# Patient Record
Sex: Female | Born: 1945
Health system: Southern US, Community
[De-identification: ages and names within clinical notes are randomized; demographics above are authoritative.]

## PROBLEM LIST (undated history)

## (undated) DIAGNOSIS — Z87442 Personal history of urinary calculi: Secondary | ICD-10-CM

## (undated) DIAGNOSIS — M549 Dorsalgia, unspecified: Secondary | ICD-10-CM

## (undated) DIAGNOSIS — F323 Major depressive disorder, single episode, severe with psychotic features: Secondary | ICD-10-CM

## (undated) DIAGNOSIS — F0392 Unspecified dementia, unspecified severity, with psychotic disturbance: Secondary | ICD-10-CM

## (undated) DIAGNOSIS — K219 Gastro-esophageal reflux disease without esophagitis: Secondary | ICD-10-CM

## (undated) DIAGNOSIS — J9811 Atelectasis: Secondary | ICD-10-CM

## (undated) DIAGNOSIS — G709 Myoneural disorder, unspecified: Secondary | ICD-10-CM

## (undated) DIAGNOSIS — R51 Headache: Secondary | ICD-10-CM

## (undated) DIAGNOSIS — F32A Depression, unspecified: Secondary | ICD-10-CM

## (undated) DIAGNOSIS — F0391 Unspecified dementia with behavioral disturbance: Secondary | ICD-10-CM

## (undated) DIAGNOSIS — R519 Headache, unspecified: Secondary | ICD-10-CM

## (undated) DIAGNOSIS — G8929 Other chronic pain: Secondary | ICD-10-CM

## (undated) DIAGNOSIS — E039 Hypothyroidism, unspecified: Secondary | ICD-10-CM

## (undated) DIAGNOSIS — I499 Cardiac arrhythmia, unspecified: Secondary | ICD-10-CM

## (undated) DIAGNOSIS — E785 Hyperlipidemia, unspecified: Secondary | ICD-10-CM

## (undated) DIAGNOSIS — N200 Calculus of kidney: Secondary | ICD-10-CM

## (undated) DIAGNOSIS — F329 Major depressive disorder, single episode, unspecified: Secondary | ICD-10-CM

## (undated) DIAGNOSIS — R292 Abnormal reflex: Secondary | ICD-10-CM

## (undated) DIAGNOSIS — R4189 Other symptoms and signs involving cognitive functions and awareness: Secondary | ICD-10-CM

## (undated) DIAGNOSIS — R0600 Dyspnea, unspecified: Secondary | ICD-10-CM

## (undated) DIAGNOSIS — M1990 Unspecified osteoarthritis, unspecified site: Secondary | ICD-10-CM

## (undated) DIAGNOSIS — J449 Chronic obstructive pulmonary disease, unspecified: Secondary | ICD-10-CM

## (undated) DIAGNOSIS — I10 Essential (primary) hypertension: Secondary | ICD-10-CM

## (undated) HISTORY — PX: SPINAL CORD STIMULATOR IMPLANT: SHX2422

## (undated) HISTORY — PX: OTHER SURGICAL HISTORY: SHX169

## (undated) HISTORY — PX: TUBAL LIGATION: SHX77

## (undated) HISTORY — DX: Hypothyroidism, unspecified: E03.9

## (undated) HISTORY — DX: Cardiac arrhythmia, unspecified: I49.9

## (undated) HISTORY — DX: Dyspnea, unspecified: R06.00

## (undated) HISTORY — DX: Calculus of kidney: N20.0

## (undated) HISTORY — DX: Gastro-esophageal reflux disease without esophagitis: K21.9

## (undated) HISTORY — DX: Dorsalgia, unspecified: M54.9

## (undated) HISTORY — DX: Chronic obstructive pulmonary disease, unspecified: J44.9

## (undated) HISTORY — DX: Atelectasis: J98.11

## (undated) HISTORY — DX: Unspecified osteoarthritis, unspecified site: M19.90

## (undated) HISTORY — DX: Other chronic pain: G89.29

## (undated) HISTORY — DX: Depression, unspecified: F32.A

## (undated) HISTORY — DX: Hyperlipidemia, unspecified: E78.5

## (undated) HISTORY — DX: Major depressive disorder, single episode, unspecified: F32.9

## (undated) HISTORY — DX: Myoneural disorder, unspecified: G70.9

## (undated) HISTORY — PX: ANTERIOR CERVICAL DECOMP/DISCECTOMY FUSION: SHX1161

---

## 1999-08-30 ENCOUNTER — Encounter: Payer: Self-pay | Admitting: Internal Medicine

## 1999-08-30 ENCOUNTER — Encounter: Admission: RE | Admit: 1999-08-30 | Discharge: 1999-08-30 | Payer: Self-pay | Admitting: Internal Medicine

## 1999-11-19 ENCOUNTER — Other Ambulatory Visit: Admission: RE | Admit: 1999-11-19 | Discharge: 1999-11-19 | Payer: Self-pay | Admitting: Internal Medicine

## 1999-12-23 ENCOUNTER — Ambulatory Visit (HOSPITAL_COMMUNITY): Admission: RE | Admit: 1999-12-23 | Discharge: 1999-12-23 | Payer: Self-pay

## 2000-01-03 ENCOUNTER — Ambulatory Visit (HOSPITAL_COMMUNITY): Admission: RE | Admit: 2000-01-03 | Discharge: 2000-01-03 | Payer: Self-pay | Admitting: Gastroenterology

## 2000-09-25 ENCOUNTER — Encounter: Payer: Self-pay | Admitting: Internal Medicine

## 2000-09-25 ENCOUNTER — Encounter: Admission: RE | Admit: 2000-09-25 | Discharge: 2000-09-25 | Payer: Self-pay | Admitting: Internal Medicine

## 2000-10-10 ENCOUNTER — Encounter: Payer: Self-pay | Admitting: Internal Medicine

## 2000-10-10 ENCOUNTER — Encounter: Admission: RE | Admit: 2000-10-10 | Discharge: 2000-10-10 | Payer: Self-pay | Admitting: Internal Medicine

## 2000-11-15 ENCOUNTER — Encounter: Payer: Self-pay | Admitting: Internal Medicine

## 2000-11-15 ENCOUNTER — Encounter: Admission: RE | Admit: 2000-11-15 | Discharge: 2000-11-15 | Payer: Self-pay | Admitting: Internal Medicine

## 2001-01-28 ENCOUNTER — Encounter: Admission: RE | Admit: 2001-01-28 | Discharge: 2001-01-28 | Payer: Self-pay | Admitting: Internal Medicine

## 2001-01-28 ENCOUNTER — Encounter: Payer: Self-pay | Admitting: Internal Medicine

## 2001-03-20 ENCOUNTER — Encounter: Payer: Self-pay | Admitting: Neurosurgery

## 2001-03-20 ENCOUNTER — Ambulatory Visit (HOSPITAL_COMMUNITY): Admission: RE | Admit: 2001-03-20 | Discharge: 2001-03-21 | Payer: Self-pay | Admitting: Neurosurgery

## 2001-05-18 ENCOUNTER — Ambulatory Visit (HOSPITAL_COMMUNITY): Admission: RE | Admit: 2001-05-18 | Discharge: 2001-05-18 | Payer: Self-pay | Admitting: Neurosurgery

## 2001-05-18 ENCOUNTER — Encounter: Payer: Self-pay | Admitting: Neurosurgery

## 2001-07-07 ENCOUNTER — Ambulatory Visit (HOSPITAL_COMMUNITY): Admission: RE | Admit: 2001-07-07 | Discharge: 2001-07-07 | Payer: Self-pay | Admitting: Neurosurgery

## 2001-07-07 ENCOUNTER — Encounter: Payer: Self-pay | Admitting: Neurosurgery

## 2001-08-23 ENCOUNTER — Encounter: Payer: Self-pay | Admitting: Family Medicine

## 2001-08-23 ENCOUNTER — Ambulatory Visit (HOSPITAL_COMMUNITY): Admission: RE | Admit: 2001-08-23 | Discharge: 2001-08-23 | Payer: Self-pay | Admitting: Family Medicine

## 2001-10-08 ENCOUNTER — Encounter: Payer: Self-pay | Admitting: Neurosurgery

## 2001-10-12 ENCOUNTER — Inpatient Hospital Stay (HOSPITAL_COMMUNITY): Admission: RE | Admit: 2001-10-12 | Discharge: 2001-10-16 | Payer: Self-pay | Admitting: Neurosurgery

## 2001-10-12 ENCOUNTER — Encounter: Payer: Self-pay | Admitting: Neurosurgery

## 2001-10-13 ENCOUNTER — Encounter: Payer: Self-pay | Admitting: Neurosurgery

## 2001-10-14 ENCOUNTER — Encounter: Payer: Self-pay | Admitting: Neurosurgery

## 2001-10-15 ENCOUNTER — Encounter: Payer: Self-pay | Admitting: Thoracic Surgery

## 2001-10-15 ENCOUNTER — Encounter: Payer: Self-pay | Admitting: Neurosurgery

## 2001-10-16 ENCOUNTER — Encounter: Payer: Self-pay | Admitting: Thoracic Surgery

## 2001-11-15 ENCOUNTER — Encounter: Admission: RE | Admit: 2001-11-15 | Discharge: 2001-11-15 | Payer: Self-pay | Admitting: Thoracic Surgery

## 2001-11-15 ENCOUNTER — Encounter: Payer: Self-pay | Admitting: Thoracic Surgery

## 2002-04-04 ENCOUNTER — Encounter: Admission: RE | Admit: 2002-04-04 | Discharge: 2002-04-04 | Payer: Self-pay | Admitting: Thoracic Surgery

## 2002-04-04 ENCOUNTER — Encounter: Payer: Self-pay | Admitting: Thoracic Surgery

## 2002-04-18 ENCOUNTER — Other Ambulatory Visit: Admission: RE | Admit: 2002-04-18 | Discharge: 2002-04-18 | Payer: Self-pay | Admitting: Internal Medicine

## 2002-05-02 ENCOUNTER — Encounter: Payer: Self-pay | Admitting: Neurosurgery

## 2002-05-02 ENCOUNTER — Ambulatory Visit (HOSPITAL_COMMUNITY): Admission: RE | Admit: 2002-05-02 | Discharge: 2002-05-02 | Payer: Self-pay | Admitting: Neurosurgery

## 2003-01-23 ENCOUNTER — Ambulatory Visit (HOSPITAL_COMMUNITY): Admission: RE | Admit: 2003-01-23 | Discharge: 2003-01-23 | Payer: Self-pay | Admitting: Gastroenterology

## 2003-02-04 ENCOUNTER — Encounter: Admission: RE | Admit: 2003-02-04 | Discharge: 2003-02-04 | Payer: Self-pay | Admitting: *Deleted

## 2003-02-10 ENCOUNTER — Encounter: Admission: RE | Admit: 2003-02-10 | Discharge: 2003-02-10 | Payer: Self-pay | Admitting: Orthopaedic Surgery

## 2003-03-17 ENCOUNTER — Inpatient Hospital Stay (HOSPITAL_COMMUNITY): Admission: RE | Admit: 2003-03-17 | Discharge: 2003-03-21 | Payer: Self-pay | Admitting: Orthopaedic Surgery

## 2003-07-21 ENCOUNTER — Inpatient Hospital Stay (HOSPITAL_COMMUNITY): Admission: RE | Admit: 2003-07-21 | Discharge: 2003-07-22 | Payer: Self-pay | Admitting: Orthopaedic Surgery

## 2004-06-25 ENCOUNTER — Encounter: Admission: RE | Admit: 2004-06-25 | Discharge: 2004-06-25 | Payer: Self-pay | Admitting: Internal Medicine

## 2004-08-11 ENCOUNTER — Ambulatory Visit (HOSPITAL_COMMUNITY): Admission: RE | Admit: 2004-08-11 | Discharge: 2004-08-11 | Payer: Self-pay | Admitting: Pulmonary Disease

## 2005-02-03 ENCOUNTER — Other Ambulatory Visit: Admission: RE | Admit: 2005-02-03 | Discharge: 2005-02-03 | Payer: Self-pay | Admitting: Internal Medicine

## 2005-02-08 ENCOUNTER — Encounter: Admission: RE | Admit: 2005-02-08 | Discharge: 2005-02-08 | Payer: Self-pay | Admitting: Internal Medicine

## 2005-02-16 ENCOUNTER — Encounter: Admission: RE | Admit: 2005-02-16 | Discharge: 2005-02-16 | Payer: Self-pay | Admitting: Internal Medicine

## 2005-03-09 ENCOUNTER — Encounter: Admission: RE | Admit: 2005-03-09 | Discharge: 2005-03-09 | Payer: Self-pay | Admitting: Internal Medicine

## 2005-05-17 ENCOUNTER — Ambulatory Visit (HOSPITAL_COMMUNITY): Admission: RE | Admit: 2005-05-17 | Discharge: 2005-05-17 | Payer: Self-pay | Admitting: Pulmonary Disease

## 2005-07-20 ENCOUNTER — Inpatient Hospital Stay (HOSPITAL_COMMUNITY): Admission: RE | Admit: 2005-07-20 | Discharge: 2005-07-22 | Payer: Self-pay | Admitting: Orthopaedic Surgery

## 2005-08-30 ENCOUNTER — Encounter: Admission: RE | Admit: 2005-08-30 | Discharge: 2005-08-30 | Payer: Self-pay | Admitting: Internal Medicine

## 2005-10-04 ENCOUNTER — Encounter (INDEPENDENT_AMBULATORY_CARE_PROVIDER_SITE_OTHER): Payer: Self-pay | Admitting: *Deleted

## 2005-10-04 ENCOUNTER — Ambulatory Visit (HOSPITAL_COMMUNITY): Admission: RE | Admit: 2005-10-04 | Discharge: 2005-10-04 | Payer: Self-pay | Admitting: *Deleted

## 2006-09-18 ENCOUNTER — Encounter: Admission: RE | Admit: 2006-09-18 | Discharge: 2006-09-18 | Payer: Self-pay | Admitting: *Deleted

## 2006-09-19 ENCOUNTER — Encounter: Admission: RE | Admit: 2006-09-19 | Discharge: 2006-09-19 | Payer: Self-pay | Admitting: Anesthesiology

## 2007-02-21 ENCOUNTER — Ambulatory Visit (HOSPITAL_COMMUNITY): Admission: RE | Admit: 2007-02-21 | Discharge: 2007-02-21 | Payer: Self-pay | Admitting: Pulmonary Disease

## 2007-03-25 ENCOUNTER — Encounter: Admission: RE | Admit: 2007-03-25 | Discharge: 2007-03-25 | Payer: Self-pay | Admitting: Neurosurgery

## 2007-10-18 ENCOUNTER — Encounter: Admission: RE | Admit: 2007-10-18 | Discharge: 2007-10-18 | Payer: Self-pay | Admitting: Orthopedic Surgery

## 2007-12-11 ENCOUNTER — Ambulatory Visit (HOSPITAL_COMMUNITY): Admission: RE | Admit: 2007-12-11 | Discharge: 2007-12-11 | Payer: Self-pay | Admitting: Anesthesiology

## 2008-02-04 ENCOUNTER — Ambulatory Visit (HOSPITAL_COMMUNITY): Admission: RE | Admit: 2008-02-04 | Discharge: 2008-02-04 | Payer: Self-pay | Admitting: Neurological Surgery

## 2008-07-11 ENCOUNTER — Ambulatory Visit (HOSPITAL_COMMUNITY): Admission: RE | Admit: 2008-07-11 | Discharge: 2008-07-11 | Payer: Self-pay | Admitting: Pulmonary Disease

## 2008-08-12 ENCOUNTER — Other Ambulatory Visit: Admission: RE | Admit: 2008-08-12 | Discharge: 2008-08-12 | Payer: Self-pay | Admitting: Family Medicine

## 2008-08-26 ENCOUNTER — Inpatient Hospital Stay (HOSPITAL_COMMUNITY): Admission: RE | Admit: 2008-08-26 | Discharge: 2008-08-28 | Payer: Self-pay | Admitting: Orthopedic Surgery

## 2009-07-02 ENCOUNTER — Encounter: Admission: RE | Admit: 2009-07-02 | Discharge: 2009-07-02 | Payer: Self-pay | Admitting: Anesthesiology

## 2009-12-10 ENCOUNTER — Ambulatory Visit (HOSPITAL_COMMUNITY): Admission: RE | Admit: 2009-12-10 | Discharge: 2009-12-10 | Payer: Self-pay | Admitting: Neurological Surgery

## 2009-12-14 ENCOUNTER — Ambulatory Visit: Payer: Self-pay | Admitting: Cardiology

## 2010-02-22 ENCOUNTER — Encounter: Admission: RE | Admit: 2010-02-22 | Discharge: 2010-02-22 | Payer: Self-pay | Admitting: Otolaryngology

## 2010-04-10 ENCOUNTER — Encounter: Payer: Self-pay | Admitting: Internal Medicine

## 2010-04-11 ENCOUNTER — Encounter: Payer: Self-pay | Admitting: Pulmonary Disease

## 2010-04-11 ENCOUNTER — Encounter: Payer: Self-pay | Admitting: Internal Medicine

## 2010-04-15 ENCOUNTER — Encounter
Admission: RE | Admit: 2010-04-15 | Discharge: 2010-04-15 | Payer: Self-pay | Source: Home / Self Care | Attending: Internal Medicine | Admitting: Internal Medicine

## 2010-06-09 ENCOUNTER — Encounter: Payer: Self-pay | Admitting: Cardiology

## 2010-06-09 DIAGNOSIS — G894 Chronic pain syndrome: Secondary | ICD-10-CM | POA: Insufficient documentation

## 2010-06-09 DIAGNOSIS — H409 Unspecified glaucoma: Secondary | ICD-10-CM | POA: Insufficient documentation

## 2010-06-09 DIAGNOSIS — G8929 Other chronic pain: Secondary | ICD-10-CM | POA: Insufficient documentation

## 2010-06-09 DIAGNOSIS — E785 Hyperlipidemia, unspecified: Secondary | ICD-10-CM | POA: Insufficient documentation

## 2010-06-14 ENCOUNTER — Ambulatory Visit: Payer: Self-pay | Admitting: Cardiology

## 2010-06-23 ENCOUNTER — Ambulatory Visit: Payer: Self-pay | Admitting: Cardiology

## 2010-06-28 LAB — URINALYSIS, ROUTINE W REFLEX MICROSCOPIC
Glucose, UA: NEGATIVE mg/dL
Hgb urine dipstick: NEGATIVE
Specific Gravity, Urine: 1.017 (ref 1.005–1.030)

## 2010-06-28 LAB — URINE MICROSCOPIC-ADD ON

## 2010-06-28 LAB — CBC
HCT: 30.9 % — ABNORMAL LOW (ref 36.0–46.0)
HCT: 42.9 % (ref 36.0–46.0)
Hemoglobin: 10.8 g/dL — ABNORMAL LOW (ref 12.0–15.0)
Hemoglobin: 11.1 g/dL — ABNORMAL LOW (ref 12.0–15.0)
MCHC: 33.7 g/dL (ref 30.0–36.0)
MCHC: 34.7 g/dL (ref 30.0–36.0)
MCHC: 35 g/dL (ref 30.0–36.0)
MCV: 92.8 fL (ref 78.0–100.0)
MCV: 93 fL (ref 78.0–100.0)
Platelets: 249 10*3/uL (ref 150–400)
RBC: 3.46 MIL/uL — ABNORMAL LOW (ref 3.87–5.11)
RDW: 13.9 % (ref 11.5–15.5)
WBC: 7.9 10*3/uL (ref 4.0–10.5)
WBC: 8.6 10*3/uL (ref 4.0–10.5)

## 2010-06-28 LAB — BASIC METABOLIC PANEL
BUN: 16 mg/dL (ref 6–23)
CO2: 28 mEq/L (ref 19–32)
CO2: 28 mEq/L (ref 19–32)
Calcium: 8.1 mg/dL — ABNORMAL LOW (ref 8.4–10.5)
Calcium: 9.8 mg/dL (ref 8.4–10.5)
Chloride: 100 mEq/L (ref 96–112)
Creatinine, Ser: 0.69 mg/dL (ref 0.4–1.2)
Creatinine, Ser: 1.06 mg/dL (ref 0.4–1.2)
GFR calc non Af Amer: 52 mL/min — ABNORMAL LOW (ref >60)
GFR calc non Af Amer: 58 mL/min — ABNORMAL LOW (ref >60)
Glucose, Bld: 133 mg/dL — ABNORMAL HIGH (ref 70–99)
Glucose, Bld: 91 mg/dL (ref 70–99)
Potassium: 3.8 mEq/L (ref 3.5–5.1)
Potassium: 3.9 mEq/L (ref 3.5–5.1)
Sodium: 131 mEq/L — ABNORMAL LOW (ref 135–145)

## 2010-06-28 LAB — DIFFERENTIAL
Basophils Relative: 0 % (ref 0–1)
Eosinophils Absolute: 0.2 10*3/uL (ref 0.0–0.7)
Eosinophils Relative: 3 % (ref 0–5)
Lymphs Abs: 2.4 10*3/uL (ref 0.7–4.0)
Monocytes Relative: 8 % (ref 3–12)

## 2010-08-03 NOTE — H&P (Signed)
NAMEMarland Kitchen  SHONIKA, KOLASINSKI           ACCOUNT NO.:  [id number]   MEDICAL RECORD NO.:  [id number]           PATIENT TYPE:  INP   LOCATION:                               FACILITY:  [facility]   PHYSICIAN:  Madlyn Frankel. Charlann Boxer, M.D.  DATE OF BIRTH:  [birth year]   DATE OF ADMISSION:  08/26/2008  DATE OF DISCHARGE:                              HISTORY & PHYSICAL   PROCEDURE:  Revision right total knee replacement.   CHIEF COMPLAINT:  Right knee pain.   HISTORY OF PRESENT ILLNESS:  A 65 year old female with a history of  right-knee pain with a history of right total knee replacement with  evidence of loosening by bone scan.  Pain is now intractable, refractory  to all conservative treatment.  She is being admitted, has been  presurgically assessed prior to revision right total knee replacement.   PAST MEDICAL HISTORY:  1. Osteoarthritis.  2. COPD.  3. Depression.  4. Hypercholesterolemia.  5. Atrial fibrillation.  6. Irritable bowel syndrome.  7. Recurrent urinary tract infections.  8. Hypothyroidism.  9. Degenerative disk disease.   PAST SURGICAL HISTORY:  1. Includes multiple cervical spine surgeries.  2. Thoracic spine surgery.  3. Left hip surgery.  4. Right total knee replacement as noted in HPI.  5. Cholecystectomy.   FAMILY HISTORY:  Heart disease.   SOCIAL HISTORY:  She is a smoker, smokes half-pack of cigarettes per day  for the past 50 years.  Primary caregiver will be her husband in the  home postoperatively.   DRUG ALLERGIES:  PENICILLIN, ASPIRIN, CODEINE.   CURRENT MEDICATIONS:  1. Inderal 80 mg p.o. b.i.d.  2. Lanoxin 0.25 mg daily.  3. Librax 2.5 mg daily.  4. Synthroid 50 mcg p.o. daily.  5. Theophylline 200 mg two p.o. daily.  6. Zoloft 100 mg p.o. daily.  7. Methadone 5 mg p.o. q. 6.  8. Diazepam 5 mg one p.o. q. 8 p.r.n.  9. Oxycodone 5 mg one p.o. q. 6 p.r.n.   REVIEW OF SYSTEMS:  GENERAL:  She has some weight change and fatigue.  HEENT: She has headaches,  insomnia, ringing in her years, balance  problems and weakness.  RESPIRATORY: She has shortness of breath on  exertion, wheezing.  Last chest x-ray was July 11, 2008.  CARDIOVASCULAR:  She has some swelling issues.  Last EKG was done on  February 04, 2008.  GI: She has nausea, constipation, difficulty  swallowing.  MUSCULOSKELETAL:  She has joint pain, joint swelling, back  pain, and muscular weakness.  Otherwise see HPI.   PHYSICAL EXAMINATION:  VITAL SIGNS:  Pulse 72, respirations 16, blood  pressure 130/70.  GENERAL:  Awake, alert and oriented.  HEENT: Normocephalic.  Neck: Supple.  No carotid bruits.  CHEST/LUNGS:  Clear to auscultation bilaterally.  BREASTS:  Deferred.  Heart: S1, S2 distinct.  ABDOMEN:  Soft, bowel sounds present.  GENITOURINARY:  Deferred.  EXTREMITIES:  Right knee pain.  Painful with range of motion and  weightbearing.  SKIN:  No cellulitis.  NEUROLOGIC:  Intact distal sensibilities.   LABORATORY DATA:  Labs, EKG, chest x-ray all pending presurgical  testing.   IMPRESSION:  Loose right total knee replacement, as evidenced by bone  scan.   PLAN OF ACTION:  Revision right total knee replacement by Dr. Charlann Boxer at  [facility] on August 26, 2008.  No medications were provided at history  and physical.  Due to aspirin allergies, may require Coumadin.  All  questions were encouraged, answered, reviewed.     ______________________________  Yetta Glassman [address], [place]      Madlyn Frankel. Charlann Boxer, M.D.  Electronically Signed    BLM/MEDQ  D:  08/20/2008  T:  08/20/2008  Job:  [id number]   cc:   Dr. Lavone Orn L. Vear Clock, M.D.  Fax: [phone]   Oneal Deputy. Juanetta Gosling, M.D.  Fax: [phone]

## 2010-08-03 NOTE — Op Note (Signed)
NAMEMarland Kitchen  CHARLANN, [name], M.D.  DATE OF BIRTH:  21-Oct-1945   DATE OF PROCEDURE:  02/04/2008  DATE OF DISCHARGE:  02/04/2008                               OPERATIVE REPORT   PREOPERATIVE DIAGNOSIS:  Intractable post-thoracotomy pain.   POSTOPERATIVE DIAGNOSIS:  Intractable post-thoracotomy pain.   PROCEDURE:  Insertion of spinal cord stimulator at T3-T4, [name], [name],  [name], [name], [name], the leads were tightened  down to the unit and the unit was placed into the pouch with the wires  coiled underneath it.  Final radiograph was obtained to make sure that  the lead had not moved at all in the thoracic spine and then the  thoracodorsal fascia was closed with 2-0 Vicryl in the interrupted  fashion and 3-0 Vicryl using subcuticular tissues in the lateral flank  site and the left side was closed with 2-0 Vicryl and 3-0 Vicryl  similarly.  Dermabond was placed on the skin.  Blood loss was nil.      Stefani Dama, M.D.  Electronically Signed     HJE/MEDQ  D:  02/04/2008  T:  02/05/2008  Job:  [id number] 

## 2010-08-03 NOTE — Op Note (Signed)
NAMEMarland Kitchen  Destiny Hughes, Destiny Hughes           ACCOUNT NO.:  [id number]   MEDICAL RECORD NO.:  [id number]          PATIENT TYPE:  INP   LOCATION:  1618                         FACILITY:  [facility]   PHYSICIAN:  Madlyn Frankel. Charlann Boxer, M.D.  DATE OF BIRTH:  1945/04/21   DATE OF PROCEDURE:  08/26/2008  DATE OF DISCHARGE:                               OPERATIVE REPORT   PREOPERATIVE DIAGNOSIS:  Right total knee replacement, with primary  concern of a loose tibial component.   POSTOPERATIVE DIAGNOSES:  Failed right total knee replacement, with a  loose tibial component, and ligament instability.   PROCEDURE:  Revision right total knee replacement; with a primary  revision of the tibial component with a DePuy MBT revision component  size 2.5.  With 10 mm augments medially and laterally size 2, and a 10  mm posterior stabilized insert.   ASSISTANT:  Surgical tech.   ANESTHESIA:  General plus a preoperative regional femoral block.   FINDINGS:  There is no clinical concerns for infection.   DRAINS:  One Hemovac.   COMPLICATIONS:  None.   TOURNIQUET TIME:  49 minutes at 250 mmHg.   The patient was stable to the recovery room.   INDICATIONS:  Destiny Hughes is a 65 year old female with history of  right total knee replacement om 2007.  She never had significant relief  of her pain and symptoms of the knee.  She had been seen and evaluated  in our office, and was worked up in ruling out infection; but then  subsequently ordering a bone scan.  The bone scan had indicated  loosening predominantly of the tibial component.   Based on the persistence of her pain, still with pain in addition to her  aggravation associated and decreased quality of life based on pain, we  discussed surgical intervention.  The risks of knee replacement surgery  revision were discussed in terms of stiffness, DVT, component failure,  need for re-revision.  Consent was obtained for the benefit of pain  relief.   PROCEDURE IN  DETAIL:  The patient was brought to the operative theater.  Once adequate anesthesia, preoperative antibiotics clindamycin  administered, the patient was positioned supine with a right thigh  tourniquet placed.  The right lower extremity was then prescrubbed,  prepped and draped in a sterile fashion.  Time-out was performed,  identifying the planned procedure, the patient and the extremity.   The leg was then exsanguinated, tourniquet elevated to 250 mmHg.  A  midline incision was made, followed by sharp dissection and debridement  of the knee joint.  Examination under anesthesia preoperatively  indicated that she had some hyperextension and had some looseness from  extension into flexion.  The ligaments felt intact, however.  Following  dissection and exposure of the proximal tibia, I was able to subluxate  it anteriorly without any concern about femoral impingement.  Based on  this, I went ahead and made the primary focus the tibial component.  Examination of the femur revealed that it was grossly stable, without  evidence of any migration.  In contrast, a small knock on the proximal  tibia with  a 0.25-inch  osteotome single blower elevated the tibial tray  right off of the cement.  There was complete debonding of the cement  component and boundary.   At this point, with the proximal tibia exposed, I removed all cement  using osteotomes.  Once I had this done, I placed an extramedullary  guide and impacted off approximately 2 cm for a 2-degree block  separation for the MBT revision component.  I checked the cut surface  and made sure that it was perpendicular in both planes; and I felt that  a 2.5 tibial tray fit best on it.   At this point, the proximal tibia was drilled and a keel punch applied.  Trial reduction was now carried out with a retained size 3 femoral  component, a size 2.5 MBT revision tray in place.  When I did this, I  went all the way up to a 20 mm insert before I  felt the knee was stable,  with the knee in neutral extension.   Given this, we chose the final components.  I decided at this point to  go ahead and augment the proximal tibia, to restore the joint line,  using 10 mm augments.  I chose the 2.5 tibial tray, size 2 augments  medially and laterally.  Because of this 10 mg augment and loss of  rotation control, I decided to use a 29-mm cemented sleeve.   Trial components were removed and the proximal tibia re-exposed.  I went  ahead an broached the proximal tibia for the 29-mm sleeve.  Final  components were opened and then cement mixed.  On the back table we  configured the 2.5 MBT tray with the sleeve and the 10-mm augments  medially and laterally.  Once this was done, cement was mixed and  matched 3 bags of cement with 500 mg of vancomycin.  Once the cement was  prepared, the final tibia components were cemented into position.  The  knee was brought out to extension with the  10 mm insert.   Palpably, the medial and lateral collateral ligaments were felt to be  snug in extension.  The medial ligament was felt much more stable with  this insert in place.   Given these findings, once I removed the remaining cement from the knee  and the cement had cured, the final 10-mm insert was chosen.   The knee was re-irrigated with pulse lavage, and the tourniquet was let  down at 49 minutes.  A medium Hemovac drain was placed deep.   At this point, the extensor mechanism was reapproximated with the knee  in flexion, using #1 Vicryl; and the remainder of the wound was closed  with 2-0 Vicryl and staples on the skin.  Skin was cleaned, dried and  dressed sterilely in a sterile bulky Jones dressing.  She was extubated  and brought to the recovery room in stable condition.      Madlyn Frankel Charlann Boxer, M.D.  Electronically Signed     MDO/MEDQ  D:  08/26/2008  T:  08/27/2008  Job:  [id number]

## 2010-08-06 NOTE — Op Note (Signed)
[facility]  Patient:    Destiny Hughes, Destiny Hughes Visit Number: [id number] MRN: [id number]          Service Type: DSU Location: 3000 [id number] Attending Physician:  Donn Pierini Dictated by:   Julio Sicks, M.D. Proc. Date: 03/20/01 Admit Date:  03/20/2001                             Operative Report  PREOPERATIVE DIAGNOSIS:  POSTOPERATIVE DIAGNOSIS:  OPERATION PERFORMED:  SURGEON:  Julio Sicks, M.D.  ASSISTANT:  ANESTHESIA:  General endotracheal.  INDICATIONS FOR PROCEDURE:  Ms. Caillier is a 65 year old female with a history of chronic neck pain and worsening bilateral upper extremity paresthesias and weakness consistent with a cervical myelopathy.  MRI scanning demonstrates severe stenosis at the C4-5, C5-6 and C6-7 levels secondary to spondylitic disease and an associated disk herniation at C4-5.  The patient has been counseled as to her options.  She has decided to proceed with a C4-5, C5-6 and C6-7 cervical diskectomy and fusion with allograft and anterior plating for hopeful improvement of her symptoms.  We discussed the risks and benefits involved in this procedure and the patient wishes to proceed.   DESCRIPTION OF PROCEDURE:  The patient was taken to the operating room and placed on the operating table in supine position.  After an adequate level of anesthesia was achieved, the patient was positioned supine with the neck slightly extended and held in place with halter traction.  The patients anterior cervical region was shaved and prepped sterilely.  A 10 blade was used to make a linear skin incision extending from midline to the medial half of the right-sided sternocleidomastoid muscle.  This was carried down sharply to the platysma.  The platysma was then divided vertically and dissection proceeded along the medial border of the sternocleidomastoid muscle and carotid sheath on the right.  The trachea and esophagus were  mobilized and retracted towards the left.  Prevertebral fascia was stripped off the anterior spinal column.  The longus colli muscles were then elevated bilaterally using electrocautery.  Deep self-retaining retractor was placed.  Intraoperative fluoroscopy was used and the C4-5, C5-6 and C6-7 levels were confirmed.  The disk spaces were then incised at all three levels with a 15 blade in a rectangular fashion.  A wide disk space clean-out was then achieved at all levels using pituitary rongeurs, forward and backward angled Carlens curets, Kerrison rongeurs and a high speed drill.  All elements of the disk were removed down to the posterior annulus.  Microscope was brought into the field and used throughout the remainder of the diskectomy.  Starting first at C4-5, the remaining aspects of annulus and osteophytes were removed down to the level of the posterior longitudinal ligament.  The posterior longitudinal ligament was then elevated and resected in piecemeal fashion using Kerrison rongeurs.  The underlying thecal sac was identified.  A wide central decompression was then performed using Kerrison rongeurs to undercut the bodies of C4 and C5.  A moderate amount of free disk herniation was encountered and completely resected.  Decompression was then proceeded out to the proximal aspect of the C5 foramina bilaterally.  Each C5 nerve root was identified and found to be free of any compression. At this point the level was irrigated and Gelfoam was placed topically for hemostasis.  A 7 mm patellar wedge allograft was then impacted in place and recessed approximately 1 mm  from the anterior cortical surface.  The procedure was then repeated at the C5-6 and C6-7.  Again, wide central decompressions were performed at both levels.  There was no evidence of injury to thecal sac or nerve roots.  There was no evidence of any residual stenosis.  Once again Gelfoam was placed at each level for  hemostasis.  6 mm patellar wedge allografts were then impacted into place at both of these levels and recessed approximately 1 mm from the anterior cortical surface.  Intraoperative fluoroscopy revealed good position of the bone grafts at the proper operative level with normal alignment of the spine.  A 57 mm Atlantis anterior cervical plate was then placed over the C4, C5, C6 and C7 levels.  It was then attached under fluoroscopic guidance by using 14 mm screws x 2 at C4, C5 and C6 and 15 mm screws x 2 at C7.  All eight screws were given a final tightening and found to be solidly within bone. Locking screws were placed at all four levels.  Retraction system was then removed.  Final images of fluoroscopy revealed good position of the bone grafts and hardware at the proper operative level with normal alignment of the spine.  Hemostasis was then ensured with bipolar electrocautery.  The wound was then irrigated one final time and then closed in a routine fashion.  There were no apparent complications.   The patient tolerated the procedure well and she returned to the recovery room postoperatively. Dictated by:   Julio Sicks, M.D. Attending Physician:  Donn Pierini DD:  03/20/01 TD:  03/20/01 Job: [id number]

## 2010-08-06 NOTE — Op Note (Signed)
NAME:  Destiny Hughes, Destiny Hughes                     ACCOUNT NO.:  [id number]   MEDICAL RECORD NO.:  [id number]                   PATIENT TYPE:  INP   LOCATION:  5014                                 FACILITY:  [facility]   PHYSICIAN:  Mark C. Ophelia Charter, M.D.                 DATE OF BIRTH:  07-Mar-1946   DATE OF PROCEDURE:  03/17/2003  DATE OF DISCHARGE:  03/21/2003                                 OPERATIVE REPORT   PREOPERATIVE DIAGNOSIS:  Left hip avascular necrosis.   POSTOPERATIVE DIAGNOSIS:  Left hip avascular necrosis.   PROCEDURE:  Left hip bipolar hemiarthroplasty, Osteonix 26-44 head, C-taper  +10 neck; primary secure fit plus hip stem, #7 size.   SURGEON:  Mark C. Ophelia Charter, M.D.   ASSISTANT:  Genene Churn. Owens, P.A.-C.   ANESTHESIA:  GOT.   ESTIMATED BLOOD LOSS:  200 mL.   COMPLICATIONS:  None.   PROCEDURE:  After the induction of general endotracheal anesthesia and  orotracheal intubation, standard prepping and draping with the patient in  the lateral  position  with an axillary roll. Preoperative Ancef was given  and the usual hip sheets, drapes, Betadine Vidrape and appropriate stockinet  and Coban were all applied.   A posterior  approach was made. The gluteus maximus was split in line with  the fibers. The piriformis was tagged prior to being divided. The nerve was  gently protected. A Charnley retractor was placed, just catching the muscle.  There was no undue tension on the nerve. The hip capsule was teed. The neck  was cut 1 fingerbreadth above the lesser trochanter. The head was removed.   The was severe avascular changes. The acetabulum was preserved.  Sequential  reaming of the femur, broaching was performed and a #7 stem was inserted  with excellent fit. Trials of the acetabulum was performed. A 45 head fit  well and bipolar snap-on was a 26 snap-on with a 44-mm external diameter.  The +10 neck restored neck length. There was good stability and flexion to  90 degrees  internal rotation to 75 to 80 degrees with adduction.   The piriformis was repaired. The loose neck  was also repaired with 0  Tycron, 0 Vicryl in the gluteus maximus fascia, 2-0 Vicryl in the  subcutaneous tissue, the skin with staple closure, postoperative dressing  and a knee immobilizer. Instrument and needle  counts were correct.                                               Mark C. Ophelia Charter, M.D.    MCY/MEDQ  D:  04/12/2003  T:  04/12/2003  Job:  [id number]

## 2010-08-06 NOTE — Discharge Summary (Signed)
NAME:  Destiny Hughes, Destiny Hughes                     ACCOUNT NO.:  [id number]   MEDICAL RECORD NO.:  [id number]                   PATIENT TYPE:  INP   LOCATION:  5014                                 FACILITY:  [facility]   PHYSICIAN:  Mark C. Ophelia Charter, M.D.                 DATE OF BIRTH:  1946/01/07   DATE OF ADMISSION:  03/17/2003  DATE OF DISCHARGE:  03/21/2003                                 DISCHARGE SUMMARY   FINAL DIAGNOSIS:  Left hip avascular necrosis.   PROCEDURE:  Left hip bipolar hemiarthroplasty on March 17, 2003.   ADDITIONAL DIAGNOSES:  1. Aseptic necrosis of femoral head.  2. Paroxysmal atrial tachycardia.  3. Hypertension.   HISTORY OF PRESENT ILLNESS:  This 65 year old female has had progressive hip  pain with plain radiograph changes of AVN and MRI evidence of near whole  head involvement.  She has less severe degenerative changes on the right hip  and no acetabular changes.  The patient's admission medications included  Phenergan, morphine sulfate 15 mg 1 p.o. q.6 h., methadone 10 mg 2 p.o. q.6  h. p.r.n. pain, Valium, Lanoxin, propranolol, MiraLax, Fleet Enema p.r.n.,  vitamin B12, B6 and nitroglycerin.  She has been treated for her chronic  pain elsewhere.   LABORATORY AND ACCESSORY CLINICAL DATA:  The patient had routine  preoperative labs which demonstrated a chest x-ray with changes of COPD, no  active disease.  C spine shows normal soft tissue, multilevel degenerative  changes.  X-rays of her hip showed avascular necrosis.   Hemoglobin was 14.5, normal differential.  Pro time and PTT were normal.  Chemistry panel was normal.  Urinalysis was normal.  The patient was O-  negative.   HOSPITAL COURSE:  The patient was admitted and after informed consent,  underwent left hip bipolar hemiarthroplasty under general anesthesia.  Acetabulum was well-preserved.  Postoperatively, she was continued on her  chronic pain medication.  She received Ancef preoperatively.  Postoperatively, she was seen by PT, OT and pharmacy anti-DVT prophylaxis  program.  She already was back on her normal morphine sulfate IR 15 mg and  her methadone.  Home health OT was arranged and she was discharged on  March 21, 2003, having met all discharge criteria by PT and OT.   CONDITION ON DISCHARGE:  Satisfactory.                                                Mark C. Ophelia Charter, M.D.    MCY/MEDQ  D:  04/12/2003  T:  04/13/2003  Job:  [id number]

## 2010-08-06 NOTE — Op Note (Signed)
NAMEMarland Hughes  ROCIO, ROAM           ACCOUNT NO.:  [id number]   MEDICAL RECORD NO.:  [id number]          PATIENT TYPE:  INP   LOCATION:  5013                         FACILITY:  [facility]   PHYSICIAN:  Mark C. Ophelia Charter, M.D.    DATE OF BIRTH:  05/10/1945   DATE OF PROCEDURE:  07/20/2005  DATE OF DISCHARGE:                                 OPERATIVE REPORT   PREOPERATIVE DIAGNOSIS:  Right knee osteoarthritis.   POSTOPERATIVE DIAGNOSIS:  Right knee osteoarthritis.   PROCEDURE:  Right cemented total knee arthroplasty, computer-assisted.   SURGEON:  Mark C. Ophelia Charter, M.D.   ANESTHESIA:  GOT plus preoperative femoral block and 15 mL of local to skin.   TOURNIQUET TIME:  One hour 17 minutes x350.   ESTIMATED BLOOD LOSS:  Minimal.   DESCRIPTION OF PROCEDURE:  After induction of general anesthesia,  preoperative Ancef prophylaxis and preoperative femoral nerve block, the leg  was prepped with DuraPrep with the usual extremity sheaths, drapes and  lateral post and heel bump had been used.  Total knee draped with sheets and  drapes, sterile skin marker, Betadine and Vi-Drape were applied on the skin.  Esmarch was wrapped around the leg and tourniquet inflated.  A midline  incision was made.  Superficial retinaculum was divided, true retinaculum  was divided and quad tendon was split between the medial one-third and  lateral two-thirds.  The patella was flipped over and was cut from facet-to-  facet, removing 9.5 mm of bone.  Initiation with the computer was performed,  entering the information which showed that there was 1 degree varus and full  extension.  Cut was made on the tibia using the computer, followed by  tensioner with mid-B range in both flexion and extension.  Femur was a 3.5.  Tibia was a 2.5.  A 10-mm spacer gave a difference of a 1-mm cut, medial and  lateral.  Cuts were made and verified.  Box cut was made and at the time of  the box cut, the box cut guide was bumping against the  femoral pin slightly  and it was elected to go ahead and pull the pin, since all measurements had  been made.  Posterior spurs were removed off the femur.  ACL, PCL and  meniscal remnants had all been resected.  A 35-mm patella was selected.  Keels were made in the tibia; 9.5 mm of bone were selected per computer and  there was symmetrical flexion and extension balance.  After pulsatile  irrigation, components were cemented into the tibia, followed by femur  spacer was placed followed by patella.  Cement was hardened at 15 minutes  and all excessive cement had been removed.  There were clinical findings  were excellent stability with the 10-mm spacer.  Tourniquet was deflated;  hemostasis was obtained.  Tourniquet time was 1 hour  and 17 minutes.  Hemostasis was obtained prior to closure.  Deep retinaculum  was closed with #1 Tycron, 2-0 Vicryl in the subcutaneous tissue, skin  staple closure, Marcaine infiltration, Xeroform, 4 x 4's, ABD, Webril, Ace  wrap and knee immobilizer.  Instrument count and  needle count were correct.      Mark C. Ophelia Charter, M.D.  Electronically Signed     MCY/MEDQ  D:  07/20/2005  T:  07/21/2005  Job:  [id number]

## 2010-08-06 NOTE — Discharge Summary (Signed)
NAMEMarland Kitchen  Destiny Hughes, Destiny Hughes           ACCOUNT NO.:  [id number]   MEDICAL RECORD NO.:  [id number]          PATIENT TYPE:  INP   LOCATION:  1618                         FACILITY:  [facility]   PHYSICIAN:  Madlyn Frankel. Charlann Boxer, M.D.  DATE OF BIRTH:  Jul 19, 1945   DATE OF ADMISSION:  08/26/2008  DATE OF DISCHARGE:  08/28/2008                               DISCHARGE SUMMARY   ADMISSION DIAGNOSES:  1. Osteoarthritis.  2. Chronic obstructive pulmonary disease.  3. Depression.  4. Hypercholesterolemia.  5. Atrial fibrillation.  6. Irritable bowel syndrome.  7. Recurrent urinary tract infection.  8. Hypothyroidism.  9. Degenerative disk disease.   DISCHARGE DIAGNOSIS:  1. Osteoarthritis.  2. Chronic obstructive pulmonary disease.  3. Depression.  4. Hypercholesterolemia.  5. Atrial fibrillation.  6. Irritable bowel syndrome.  7. Recurrent urinary tract infections.  8. Hypothyroidism.  9. Degenerative disk disease.   HISTORY OF PRESENT ILLNESS:  This 65 year old female with a history of  right knee pain and a history right total knee replacement with evidence  of loosening by bone scan.   CONSULTATION:  None.   PROCEDURE:  A revision of right total knee replacement by surgeon Dr.  Durene Romans.   LABORATORY DATA:  CBC final reading showed her white blood cell count of  7.9, hemoglobin 11.1, hematocrit 32.1, platelets 182.  White cell  differential normal.  Coagulation normal.  Metabolic panel:  Sodium 134,  potassium 3.9, BUN 4, creatinine 0.69, glucose 119.  Calcium was 8.1.   HOSPITAL COURSE:  The patient underwent a revision of her right total  knee replacement and tolerated the procedure well. She was admitted to  the orthopedic floor.  Her stay was unremarkable.  She remained  hemodynamically and orthopedically stable.  By day two she was doing  great afebrile, and the dressing was changed.  No drainage from the  wound.  Home health care was selected.  She had met all criteria for  discharge home with home health care PT.   DISCHARGE DISPOSITION:  Discharged home with home health care PT in a  stable and improved condition.   DISCHARGE DIET:  Heart-healthy diet.   DISCHARGE INSTRUCTIONS:  Keep the dressing dry.   DISCHARGE PHYSICAL THERAPY:  Weightbearing as tolerated with use of a  rolling walker.  Range of motion goal is 0 to 125 in six weeks.   DISCHARGE MEDICATIONS:  1. Oxycodone 5 mg, one to three tablets p.o. q.4-6h. p.r.n. pain.  2. Robaxin 5 mg, one p.o. q.6h. p.r.n. muscle spasm and pain.  3. Aspirin 325 mg p.o. b.i.d. x6 weeks.  4. Colace 100 mg p.o. b.i.d.  5. MiraLax 17 grams p.o. daily.  6. Propranolol 80 mg, one p.o. q.a.m. and one p.o. q.p.m.  7. Methadone 5 mg, one p.o. q.6h. p.r.n. pain.  8. Promethazine 25 mg, one p.o. q.12h. p.r.n.  9. Librax 2.5/5 mg p.o. q.a.m.  10.Lanoxin 0.25 mg p.o. q.a.m.  11.Diazepam 5 mg, one p.o. q.8h. p.r.n.  12.Sertraline 100 mg, one p.o. q.a.m.  13.Synthroid 75 mcg at noon daily.  14.Lorazepam q.h.s. p.r.n.  15.Ipratropium bromide 0.06% nasal spray, one spray in  each nostril      q.a.m. and two sprays in each nostril q.p.m.  16.Biotin 500 mcg p.o. q.a.m.  17.Phenolphthalein 200 mg, one p.o. q.a.m. and one p.o. q.p.m.  18.Sennosides 25 mg, two in the p.m.  19.Equate sleep aide p.r.n.  20.To complete Cipro 500 mg x5 days, one p.o. b.i.d. which was started      on 08/25/2008.  Complete on 08/30/2008.   FOLLOWUP:  Follow up with Dr. Charlann Boxer at phone number [phone] in two weeks  for a wound check.     ______________________________  Yetta Glassman. [address], [place]      Madlyn Frankel. Charlann Boxer, M.D.  Electronically Signed    BLM/MEDQ  D:  09/05/2008  T:  09/05/2008  Job:  [id number]   cc:   Loraine Leriche L. Vear Clock, M.D.  Fax: [phone]   Oneal Deputy. Juanetta Gosling, M.D.  Fax: [phone]   Christiana Fuchs, M.D.

## 2010-08-06 NOTE — Op Note (Signed)
. [facility]  Patient:    Destiny Hughes, MOAT Visit Number: [id number] MRN: [id number]          Service Type: SUR Location: [id number] Attending Physician:  Donn Pierini Dictated by:   D. Karle Plumber, M.D. Proc. Date: 10/12/01 Admit Date:  10/12/2001   CC:         Dr. Edwyna Shell (2 copies)  Julio Sicks, M.D.   Operative Report  PREOPERATIVE DIAGNOSIS:  T7-8 ______.  POSTOPERATIVE DIAGNOSIS:  T7-8 ______.  OPERATION PERFORMED:  Right thoracotomy for exposure for microdiskectomy.  SURGEON:  D. Karle Plumber, M.D.  FIRST ASSISTANT:  Gina H. Collins, P.A.-C.  ANESTHESIA:  General.  DESCRIPTION OF PROCEDURE:  After adequate general anesthesia, the patient was turned to the right lateral thoracotomy position.  He was prepped and draped in the usual sterile manner.  Posterolateral thoracotomy was made and was carried down with electrocautery through the subcutaneous tissue and fascia. The latissimus was divided with electrocautery, and the serratus was reflected anteriorly.  The sixth intercostal space was entered where a portion of the seventh rib was taken subperiosteally at the angle.  Two TPAs were placed at the right angle; then Dr. Jordan Likes and I exposed the rib head of the seventh space and the disk space by doing a pleural flap from medially to laterally.  After this had been done, Dr. Jordan Likes performed a microdiskectomy, and we returned to close.  Two stab wounds were made below the original incisions, and two chest tubes were inserted and tied in place with 0 silk.  The chest was closed with three precostals; then a Marcaine block was done in the usual fashion, and then the On-Q catheters were inserted through putting in the sheaths and then putting the catheters underneath the precostals medially and laterally and sutured them in place with 3-0 silks.  The sheaths were removed, and they were peel-away sheaths.  The serratus was  closed with interrupted 3-0 Vicryl, and the platysmus was closed with running 0 Vicryl, subcutaneous tissue with 3-0 Vicryl, and subcuticular with 3-0 Vicryl, and Dermabond for the skin.  The patient returned to the recovery room in stable condition. Dictated by:   D. Karle Plumber, M.D. Attending Physician:  Donn Pierini DD:  10/12/01 TD:  10/15/01 Job: [id number]

## 2010-08-06 NOTE — Op Note (Signed)
   NAME:  DAMONICA, CHOPRA                     ACCOUNT NO.:  [id number]   MEDICAL RECORD NO.:  [id number]                   PATIENT TYPE:  AMB   LOCATION:  ENDO                                 FACILITY:  [facility]   PHYSICIAN:  Danise Edge, M.D.                DATE OF BIRTH:  23-Feb-1946   DATE OF PROCEDURE:  01/23/2003  DATE OF DISCHARGE:                                 OPERATIVE REPORT   PROCEDURE:  Screening colonoscopy.   INDICATIONS FOR PROCEDURE:  The patient is a 65 year old female born [birth year].  The patient is scheduled to undergo her first screening  colonoscopy with polypectomy to prevent colon cancer.   ENDOSCOPIST:  Danise Edge, M.D.   PREMEDICATION:  Versed 7 mg and Demerol 70 mg.   PROCEDURE:  After obtaining informed consent the patient was placed in the  left lateral decubitus position.  I administered intravenous Demerol and  intravenous Versed to achieve conscious sedation for the procedure.  The  patient's blood pressure, oxygen saturation and cardiac rhythm were  monitored throughout the procedure and documented in the medical record.   Anal inspection was normal.  Digital rectal examination was normal.  The  Olympus adjustable pediatric colonoscope was introduced in to the rectum and  advanced to the cecum.  Colonic preparation for the exam today was  excellent.   Rectum:  Normal.   Sigmoid colon and descending colon:  Normal.   Splenic flexure:  Normal.   Transverse colon:  Normal.   Hepatic flexure:  Normal.   Ascending colon:  Normal.   Cecum and ileocecal valve:  Normal.    ASSESSMENT:  Normal screening proctocolonoscopy to the cecum.  No endoscopic  evidence for the presence of colorectal neoplasia.                                               Danise Edge, M.D.    MJ/MEDQ  D:  01/23/2003  T:  01/23/2003  Job:  [id number]   cc:   Darius Bump, M.D.  [place].Bott N. [address]  [place] [address]  Fax: [phone]

## 2010-08-06 NOTE — Op Note (Signed)
NAME:  Destiny Hughes, Destiny Hughes                     ACCOUNT NO.:  [id number]   MEDICAL RECORD NO.:  [id number]                   PATIENT TYPE:  INP   LOCATION:  5735                                 FACILITY:  [facility]   PHYSICIAN:  Mark C. Ophelia Charter, M.D.                 DATE OF BIRTH:  [birth year]   DATE OF PROCEDURE:  07/21/2003  DATE OF DISCHARGE:                                 OPERATIVE REPORT   PREOPERATIVE DIAGNOSIS:  C6-7 pseudoarthrosis, status post multilevel  anterior cervical fusion with recurrent foraminal stenosis.   POSTOPERATIVE DIAGNOSIS:  C6-7 pseudoarthrosis, status post multilevel  anterior cervical fusion with recurrent foraminal stenosis.   OPERATION PERFORMED:  C6-7 posterior cervical fusion, allograft and wiring.   SURGEON:  Mark C. Ophelia Charter, M.D.   ASSISTANT:  Sandrea Matte, P.A.   ANESTHESIA:  General orotracheal anesthesia.   ESTIMATED BLOOD LOSS:  400 mL.   DESCRIPTION OF PROCEDURE:  After induction of general anesthesia, the  patient received preoperative Ancef prophylaxis.  She was placed prone with  the head in a horseshoe head holder.  Careful padding and positioning with  the arms at the side.  The neck was prepped with DuraPrep after it was  isolated with 10-10 drapes.  Area squared with towels.  Sterile Mayo stand  for head.  Thyroid sheet was applied.  Sterile skin markers used in the skin  prior to Vi-drape, Betadine impregnated draped placement.  Midline incision  was made, starting at C7 counting from distal to proximal.  The spinous  process and lamina was exposed.  C6 was extremely small and was stuck to the  C5. There was motion between C6 and C7 but not between 5 and 6 but I was  unable to even get a right angle clamp between spinous processes of 5 and 6.  Two x-rays were taken until appropriate level was identified due to shoulder  making the inferior aspect quite difficult to see.  Because spinous process  of 5 was a lot larger, bur was used to  probe 5, 6 and 7 and fusion was  performed from C5 to C7 and 24 gauge wire was twisted up with power drill  into a cable.  The cable was passed underneath the interspinous ligament at  C7 between C7 and T1 at the base and then a small notch was cut into C5 and  the wire was tightened down.  20 mL of allograft was mixed with 10 mL of  bone graft putty.  It was mixed with small amount of saline solution,  smushed together and mixed well and then placed on both sides of the lamina  that had been decorticated using a 6.5 TPS round bur.  There was good bone  bleeding surface.  Bone graft was packed in.  Fascia was then closed with 0  Vicryl at the first level.  Hemovacs were placed followed by closure of the  more superficial  layer with 0 and 2-0 Vicryl, skin staple closure.  Postoperative dressing, soft cervical collar.  Instrument count, needle  count was correct.  The patient tolerated the procedure well, was  transferred to the recovery room and was neurologically intact.                                               Mark C. Ophelia Charter, M.D.    MCY/MEDQ  D:  07/21/2003  T:  07/22/2003  Job:  [id number]

## 2010-08-06 NOTE — Op Note (Signed)
NAMEMarland Kitchen  Destiny Hughes, Destiny Hughes           ACCOUNT NO.:  [id number]   MEDICAL RECORD NO.:  [id number]          PATIENT TYPE:  AMB   LOCATION:  DAY                          FACILITY:  [facility]   PHYSICIAN:  Alfonse Ras, MD   DATE OF BIRTH:  1945-05-31   DATE OF PROCEDURE:  10/04/2005  DATE OF DISCHARGE:  10/04/2005                                 OPERATIVE REPORT   PREOPERATIVE DIAGNOSIS:  Symptomatic cholelithiasis.   POSTOP DIAGNOSIS:  Symptomatic cholelithiasis.   PROCEDURES:  Laparoscopic cholecystectomy.   SURGEON:  Alfonse Ras, M.D.   ASSISTANT:  None.   ANESTHESIA:  General   DESCRIPTION OF PROCEDURE:  The patient was taken to operating room and  placed in supine position. After adequate general anesthesia was induced  using endotracheal tube the abdomen was prepped and draped in normal sterile  fashion. Using a transverse infraumbilical incision I dissected down to the  fascia.  Fascia was opened vertically.  An 0 Vicryl pursestring suture was  placed around the fascial defect.  Hasson trocar was placed in the abdomen  and pneumoperitoneum was obtained.  Under direct vision, an 11 mm trocar was  placed in the right abdomen and two 5 mm trocars were placed in the right  abdomen.  Gallbladder was identified and retracted cephalad.  Dissection  began at the neck of the gallbladder which easily visualized a fairly long  and thin cystic duct.  The patient had preoperative normal liver function  tests therefore I opted with a critical view to go ahead and triply clip and  divide the cystic duct.  Cystic artery was identified and in a similar  fashion triply clipped and divided.  Gallbladder was taken off gallbladder  bed using Bovie electrocautery and removed through umbilical port.  Adequate  hemostasis was assured.  The infraumbilical fascial defect was closed with 0  Vicryl pursestring sutures.  Skin incisions were closed with subcuticular 4-  0 Monocryl.  Steri-Strips and  sterile dressings were applied.  The patient  tolerated the procedure well and went to PACU in good condition.      Alfonse Ras, MD  Electronically Signed     KRE/MEDQ  D:  11/02/2005  T:  11/02/2005  Job:  [id number]

## 2010-08-06 NOTE — Discharge Summary (Signed)
   NAME:  Destiny Hughes, Destiny Hughes                     ACCOUNT NO.:  [id number]   MEDICAL RECORD NO.:  [id number]                   PATIENT TYPE:  INP   LOCATION:  3028                                 FACILITY:  [facility]   PHYSICIAN:  Kathaleen Maser. Pool, M.D.                 DATE OF BIRTH:  01/27/1946   DATE OF ADMISSION:  10/12/2001  DATE OF DISCHARGE:  10/16/2001                                 DISCHARGE SUMMARY   FINAL DIAGNOSIS:  Right T6-7 herniated nucleus pulposus with myelopathy.   OPERATIONS AND TREATMENTS:  Right transthoracic microdiskectomy at T6-7.   HISTORY OF PRESENT ILLNESS:  The patient is a 65 year old female with  history of mid thoracic pain and bilateral lower extremity symptoms  consistent with thoracic myelopathy.  An MRI scans demonstrates a focal disc  herniation at T6-7 with compression of the patient's spinal cord.  She  presents now for transthoracic microdiskectomy in hopes of improving her  symptoms.   HOSPITAL COURSE:  The patient was taken to the operating room and underwent  uncomplicated transthoracic microdiskectomy performed at T6-7.  Postoperatively the patient did well.  Her preoperative pain had resolved.  Her lower extremity strength and sensation had improved.  She was managed in  the intensive care unit.  Serial chest x-rays revealed no evidence of  pneumothorax.  Her chest tubes were removed.  She was gradually mobilized.  She was tolerating a regular diet.  Wound was healing well.  Pain was well  controlled.  Ready for discharge home.   CONDITION ON DISCHARGE:  Improved.   DISCHARGE MEDICATIONS:  Percocet and Valium as needed.  She is to resume all  home medications.   DISCHARGE FOLLOWUP:  In one week in my office and in one week with Dr.  Edwyna Shell.                                               Henry A. Pool, M.D.    HAP/MEDQ  D:  10/26/2001  T:  10/31/2001  Job:  [id number]

## 2010-08-06 NOTE — Op Note (Signed)
Destiny Hughes  Patient:    [name], [name] Visit Number: [id number] MRN: [id number]          Service Type: SUR Location: 3100 [id number] Attending Physician:  Donn Pierini Dictated by:   Julio Sicks, M.D. Proc. Date: 10/12/01 Admit Date:  10/12/2001                             Operative Report  PREOPERATIVE DIAGNOSIS:  Right T6-7 herniated nucleus pulposus with myelopathy.  POSTOPERATIVE DIAGNOSIS:  Right T6-7 herniated nucleus pulposus with myelopathy.  OPERATION PERFORMED:  Right transthoracic microdiskectomy at T6-7.  SURGEON:  Julio Sicks, M.D.  ASSISTANT:  Reinaldo Meeker, M.D.  ANESTHESIA:  General orotracheal.  THORACIC SURGEON:  D. Karle Plumber, M.D.  INDICATIONS FOR PROCEDURE:  [name] is a 65 year old female who has history of a midthoracic pain with radiation into both lower extremities, right greater than left.  She is having difficulty with paresthesias and numbness involving both lower extremities, right greater than left.  She is beginning to have some difficulty ambulating.  MRI scanning demonstrates a significant disk herniation at the T6-7 level with compression of the spinal cord off to the right side.  The patient was then counseled as to her options. She has decided to proceed with a right-sided transthoracic microdiskectomy in hopes of improving her symptoms.  DESCRIPTION OF PROCEDURE:  The patient was taken to the operating room in a supine position.  After an adequate level of anesthesia, the patient was positioned in the left lateral decubitus position.  Her right lung was deflated.  The right chest wall was prepped and draped sterilely.  Dr. Edwyna Shell then made an incision and performed a thoracotomy through the sixth interspace.  Retractors were placed.  The lung was retracted away.  The seventh rib head was identified.  A marker was placed.  X-ray was taken and the T6-7 level was confirmed.  The  rib head of T7 was resected using Kerrison rongeurs and the high speed drill.  The disk space was then incised with the 15 blade in rectangular fashion.  A wide disk space clean-out was then achieved using pituitary rongeurs, high speed drill and microdissection instruments.  The disk was removed down to the level of the posterior longitudinal ligament.  The posterior longitudinal ligament was then elevated and resected in piecemeal fashion.  A focal area of disk herniation was encountered and completely resected.  A wide anterior decompression was performed using Kerrison rongeurs and Epstein curets.  At this point there is no evidence of any further compression of the thecal sac.  There was no evidence of injury to thecal sac or nerve roots.  The wound was then irrigated with antibiotic solution.  Gelfoam was placed topically for hemostasis which was found to be good.  Dr. Edwyna Shell returned, irrigated the chest and closed in a routine fashion.  There were no apparent complications.  The patient tolerated the procedure well and she returned to the recovery room postoperatively. Dictated by:   Julio Sicks, M.D. Attending Physician:  Donn Pierini DD:  10/12/01 TD:  10/15/01 Job: [id number]

## 2010-08-30 ENCOUNTER — Other Ambulatory Visit (HOSPITAL_COMMUNITY): Payer: Self-pay | Admitting: Orthopedic Surgery

## 2010-08-30 DIAGNOSIS — M25561 Pain in right knee: Secondary | ICD-10-CM

## 2010-09-03 ENCOUNTER — Other Ambulatory Visit (HOSPITAL_COMMUNITY): Payer: Self-pay

## 2010-09-03 ENCOUNTER — Encounter (HOSPITAL_COMMUNITY): Payer: Self-pay

## 2010-09-09 ENCOUNTER — Encounter (HOSPITAL_COMMUNITY)
Admission: RE | Admit: 2010-09-09 | Discharge: 2010-09-09 | Disposition: A | Discharge status: Home / Self Care | Payer: Medicare Other | Source: Ambulatory Visit | Attending: Orthopedic Surgery | Admitting: Orthopedic Surgery

## 2010-09-09 DIAGNOSIS — Z96659 Presence of unspecified artificial knee joint: Secondary | ICD-10-CM | POA: Insufficient documentation

## 2010-09-09 DIAGNOSIS — M25569 Pain in unspecified knee: Secondary | ICD-10-CM | POA: Insufficient documentation

## 2010-09-09 DIAGNOSIS — M7989 Other specified soft tissue disorders: Secondary | ICD-10-CM | POA: Insufficient documentation

## 2010-09-09 DIAGNOSIS — M25561 Pain in right knee: Secondary | ICD-10-CM

## 2010-09-09 MED ORDER — TECHNETIUM TC 99M MEDRONATE IV KIT
23.5000 | PACK | Freq: Once | INTRAVENOUS | Status: AC | PRN
  Administered 2010-09-09: 24 via INTRAVENOUS

## 2010-09-14 ENCOUNTER — Encounter: Payer: Self-pay | Admitting: Cardiology

## 2010-10-07 ENCOUNTER — Other Ambulatory Visit: Payer: Self-pay | Admitting: Internal Medicine

## 2010-10-07 ENCOUNTER — Ambulatory Visit
Admission: RE | Admit: 2010-10-07 | Discharge: 2010-10-07 | Disposition: A | Discharge status: Home / Self Care | Payer: Medicare Other | Source: Ambulatory Visit | Attending: Internal Medicine | Admitting: Internal Medicine

## 2010-11-02 ENCOUNTER — Ambulatory Visit (HOSPITAL_COMMUNITY)
Admission: RE | Admit: 2010-11-02 | Discharge: 2010-11-02 | Disposition: A | Discharge status: Home / Self Care | Payer: Medicare Other | Source: Ambulatory Visit | Attending: Anesthesiology | Admitting: Anesthesiology

## 2010-11-02 ENCOUNTER — Other Ambulatory Visit (HOSPITAL_COMMUNITY): Payer: Self-pay | Admitting: Anesthesiology

## 2010-11-02 DIAGNOSIS — R52 Pain, unspecified: Secondary | ICD-10-CM

## 2010-11-02 DIAGNOSIS — M546 Pain in thoracic spine: Secondary | ICD-10-CM | POA: Insufficient documentation

## 2010-11-02 DIAGNOSIS — G8929 Other chronic pain: Secondary | ICD-10-CM | POA: Insufficient documentation

## 2010-11-15 ENCOUNTER — Ambulatory Visit
Admission: RE | Admit: 2010-11-15 | Discharge: 2010-11-15 | Disposition: A | Discharge status: Home / Self Care | Payer: Medicare Other | Source: Ambulatory Visit | Attending: Anesthesiology | Admitting: Anesthesiology

## 2010-11-15 ENCOUNTER — Other Ambulatory Visit: Payer: Self-pay | Admitting: Anesthesiology

## 2010-11-15 DIAGNOSIS — R0789 Other chest pain: Secondary | ICD-10-CM

## 2010-12-08 ENCOUNTER — Encounter: Payer: Self-pay | Admitting: Cardiology

## 2010-12-08 ENCOUNTER — Ambulatory Visit (INDEPENDENT_AMBULATORY_CARE_PROVIDER_SITE_OTHER): Payer: Medicare Other | Admitting: Cardiology

## 2010-12-08 VITALS — BP 110/60 | HR 60 | Wt 155.0 lb

## 2010-12-08 DIAGNOSIS — R0609 Other forms of dyspnea: Secondary | ICD-10-CM

## 2010-12-08 DIAGNOSIS — J441 Chronic obstructive pulmonary disease with (acute) exacerbation: Secondary | ICD-10-CM

## 2010-12-08 DIAGNOSIS — R002 Palpitations: Secondary | ICD-10-CM

## 2010-12-08 DIAGNOSIS — R0989 Other specified symptoms and signs involving the circulatory and respiratory systems: Secondary | ICD-10-CM

## 2010-12-08 DIAGNOSIS — G894 Chronic pain syndrome: Secondary | ICD-10-CM

## 2010-12-08 DIAGNOSIS — R0600 Dyspnea, unspecified: Secondary | ICD-10-CM

## 2010-12-08 DIAGNOSIS — R42 Dizziness and giddiness: Secondary | ICD-10-CM

## 2010-12-08 DIAGNOSIS — M1990 Unspecified osteoarthritis, unspecified site: Secondary | ICD-10-CM

## 2010-12-08 DIAGNOSIS — E785 Hyperlipidemia, unspecified: Secondary | ICD-10-CM

## 2010-12-08 NOTE — Assessment & Plan Note (Signed)
The patient has exertional dyspnea.  She is unable to get any regular exercise because of her neurosurgical issues.  Also contributing to her dyspnea is the fact that she is smoking just under a pack of cigarettes a day against advice.  We did counsel her in the importance of quitting smoking today.  She's not expressing any productive cough or purulent sputum or hemoptysis.  No pleuritic chest pain.

## 2010-12-08 NOTE — Assessment & Plan Note (Signed)
The patient has a history of hypercholesterolemia.  She is presently not on any statin therapy.  She is trying to watch her diet in terms of cholesterol

## 2010-12-08 NOTE — Progress Notes (Signed)
Destiny Hughes Date of Birth:  08-09-1945 [facility] [address]   [address] [place], [place]  [address] [phone]           Fax   [phone]  History of Present Illness: This pleasant 65 year old woman is seen for a scheduled followup office visit.  He is a history of palpitations and atypical chest pain and dyspnea.  He does not have any history of known coronary artery disease.  She had a normal adenosine Cardiolite stress test in 2007 which time her ejection fraction was 60%.  She had an echocardiogram in 04/01/1998 which did not show any evidence of mitral valve prolapse she has a lot of neurosurgical issues.  She has an implanted spinal stimulator implanted by Dr. Danielle Dess about 3 years ago.  She attends the pain clinic which is run by Dr. Vear Clock.  He had a normal Cardiolite stress test in May of 2011 her most recent echocardiogram 08/10/09 showed mild aortic sclerosis and showed normal systolic function with abnormal diastolic function.  Her pulmonary artery pressure was normal  Current Outpatient Prescriptions  Medication Sig Dispense Refill  . diazepam (VALIUM) 5 MG tablet Take 5 mg by mouth every 6 (six) hours as needed.        . digoxin (LANOXIN) 0.25 MG tablet Take 250 mcg by mouth daily.       Marland Kitchen levothyroxine (SYNTHROID, LEVOTHROID) 88 MCG tablet Take 88 mcg by mouth daily.        Marland Kitchen METHADONE HCL PO Take by mouth. 5mg . Every 4-6 hours       . oxycodone (OXY-IR) 5 MG capsule Take 5 mg by mouth as needed. 2 TO 3 TABLETS EVERY 6-8 HOURS AS NEEDED       . propranolol (INDERAL) 80 MG tablet Take 80 mg by mouth 2 (two) times daily.        . sertraline (ZOLOFT) 100 MG tablet Take 100 mg by mouth daily.        . theophylline (THEO-24) 200 MG 24 hr capsule Take 200 mg by mouth daily.          Allergies  Allergen Reactions  . Codeine   . Ecotrin (Aspirin)   . Penicillins     Patient Active Problem List  Diagnoses  . Palpitation  . Tachycardia   . Dyspnea  . Hyperlipemia  . Glaucoma  . Chronic neck pain  . Chronic back pain    History  Smoking status  . Current Everyday Smoker -- 0.5 packs/day for 50 years  . Types: Cigarettes  Smokeless tobacco  . Not on file    History  Alcohol Use No    Family History  Problem Relation Age of Onset  . Heart attack Father   . Aneurysm Mother     CEREBRAL    Review of Systems: Constitutional: no fever chills diaphoresis or fatigue or change in weight.  Head and neck: no hearing loss, no epistaxis, no photophobia or visual disturbance. Respiratory: No cough, shortness of breath or wheezing. Cardiovascular: No chest pain peripheral edema, palpitations. Gastrointestinal: No abdominal distention, no abdominal pain, no change in bowel habits hematochezia or melena. Genitourinary: No dysuria, no frequency, no urgency, no nocturia. Musculoskeletal:No arthralgias, no back pain, no gait disturbance or myalgias. Neurological: No dizziness, no headaches, no numbness, no seizures, no syncope, no weakness, no tremors. Hematologic: No lymphadenopathy, no easy bruising. Psychiatric: No confusion, no hallucinations, no sleep disturbance.    Physical Exam: Ceasar Mons  Vitals:   12/08/10 1431  BP: 110/60  Pulse: 60  The general appearance reveals a well-developed well-nourished woman in no distress.Pupils equal and reactive.   Extraocular Movements are full.  There is no scleral icterus.  The mouth and pharynx are normal.  The neck is supple.  The carotids reveal no bruits.  The jugular venous pressure is normal.  The thyroid is not enlarged.  There is no lymphadenopathy.  Chest reveals scattered rhonchi bilaterally The precordium is quiet.  The first heart sound is normal.  The second heart sound is physiologically split.  There is no murmur gallop rub or click.  There is no abnormal lift or heave.  The abdomen is soft and nontender. Bowel sounds are normal. The liver and spleen are not  enlarged. There Are no abdominal masses. There are no bruits.  The pedal pulses are good.  There is no phlebitis or edema.  There is no cyanosis or clubbing.  The skin is warm and dry.  There is no rash.       Assessment / Plan: Continue same medication.  Recheck in 6 months

## 2010-12-08 NOTE — Assessment & Plan Note (Signed)
Patient continues to have occasional palpitations.  She's not expressing any recent chest pain

## 2010-12-21 LAB — CBC
Hemoglobin: 14.8
MCHC: 33.8
MCV: 92.6
RBC: 4.73
RDW: 13.7

## 2010-12-21 LAB — BASIC METABOLIC PANEL
CO2: 28
Chloride: 104
GFR calc Af Amer: >60
Glucose, Bld: 93
Sodium: 138

## 2011-01-19 ENCOUNTER — Telehealth: Payer: Self-pay | Admitting: Cardiology

## 2011-01-19 NOTE — Telephone Encounter (Signed)
Destiny Hughes is calling because pt needs surgical clearance form was faxed over on Oct. 25th and Surgery is scheduled for 11/13 and they have not heard anything yet so she is following up

## 2011-01-19 NOTE — Telephone Encounter (Signed)
On  Dr. Yevonne Pax desk for review

## 2011-01-19 NOTE — Telephone Encounter (Signed)
Left message with [facility] fax when done

## 2011-01-21 ENCOUNTER — Telehealth: Payer: Self-pay | Admitting: Cardiology

## 2011-01-21 NOTE — Telephone Encounter (Signed)
error 

## 2011-01-21 NOTE — Telephone Encounter (Signed)
Pt surgery is scheduled for 11/13 and she has an appt to see Dr. Patty Sermons on the 12th and she was thinking that was kinda close and wondering if she could get in sooner

## 2011-01-21 NOTE — Telephone Encounter (Signed)
Spoke with patient, will keep appointment as scheduled.  Clearance has been sent to orthopedic already

## 2011-01-24 ENCOUNTER — Ambulatory Visit (HOSPITAL_COMMUNITY)
Admission: RE | Admit: 2011-01-24 | Discharge: 2011-01-24 | Disposition: A | Discharge status: Home / Self Care | Payer: Medicare Other | Source: Ambulatory Visit | Attending: Internal Medicine | Admitting: Internal Medicine

## 2011-01-24 ENCOUNTER — Other Ambulatory Visit (HOSPITAL_COMMUNITY): Payer: Self-pay | Admitting: Internal Medicine

## 2011-01-24 ENCOUNTER — Encounter (HOSPITAL_COMMUNITY): Payer: Self-pay | Admitting: Pharmacy Technician

## 2011-01-24 ENCOUNTER — Encounter: Payer: Self-pay | Admitting: Cardiology

## 2011-01-24 DIAGNOSIS — J209 Acute bronchitis, unspecified: Secondary | ICD-10-CM | POA: Insufficient documentation

## 2011-01-24 DIAGNOSIS — R059 Cough, unspecified: Secondary | ICD-10-CM | POA: Insufficient documentation

## 2011-01-24 DIAGNOSIS — R05 Cough: Secondary | ICD-10-CM | POA: Insufficient documentation

## 2011-01-24 DIAGNOSIS — R0602 Shortness of breath: Secondary | ICD-10-CM | POA: Insufficient documentation

## 2011-01-24 DIAGNOSIS — J449 Chronic obstructive pulmonary disease, unspecified: Secondary | ICD-10-CM

## 2011-01-25 ENCOUNTER — Encounter (HOSPITAL_COMMUNITY): Payer: Medicare Other

## 2011-01-25 ENCOUNTER — Encounter (HOSPITAL_COMMUNITY): Payer: Self-pay

## 2011-01-25 DIAGNOSIS — M1990 Unspecified osteoarthritis, unspecified site: Secondary | ICD-10-CM

## 2011-01-25 DIAGNOSIS — G709 Myoneural disorder, unspecified: Secondary | ICD-10-CM

## 2011-01-25 HISTORY — DX: Unspecified osteoarthritis, unspecified site: M19.90

## 2011-01-25 HISTORY — PX: APPENDECTOMY: SHX54

## 2011-01-25 HISTORY — DX: Myoneural disorder, unspecified: G70.9

## 2011-01-25 HISTORY — PX: THORACIC SPINE SURGERY: SHX802

## 2011-01-25 HISTORY — PX: CARPAL TUNNEL RELEASE: SHX101

## 2011-01-25 HISTORY — PX: KNEE ARTHROPLASTY: SHX992

## 2011-01-25 HISTORY — PX: CHOLECYSTECTOMY: SHX55

## 2011-01-25 HISTORY — PX: CERVICAL SPINE SURGERY: SHX589

## 2011-01-25 HISTORY — PX: JOINT REPLACEMENT: SHX530

## 2011-01-25 LAB — DIFFERENTIAL
Basophils Relative: 0 % (ref 0–1)
Eosinophils Absolute: 0.2 10*3/uL (ref 0.0–0.7)
Monocytes Relative: 7 % (ref 3–12)
Neutrophils Relative %: 59 % (ref 43–77)

## 2011-01-25 LAB — URINALYSIS, ROUTINE W REFLEX MICROSCOPIC
Bilirubin Urine: NEGATIVE
Hgb urine dipstick: NEGATIVE
Ketones, ur: NEGATIVE mg/dL
Nitrite: NEGATIVE
pH: 6.5 (ref 5.0–8.0)

## 2011-01-25 LAB — CBC
Hemoglobin: 14.9 g/dL (ref 12.0–15.0)
MCH: 30.6 pg (ref 26.0–34.0)
MCHC: 34.3 g/dL (ref 30.0–36.0)
Platelets: 283 10*3/uL (ref 150–400)

## 2011-01-25 LAB — SURGICAL PCR SCREEN
MRSA, PCR: NEGATIVE
Staphylococcus aureus: NEGATIVE

## 2011-01-25 LAB — COMPREHENSIVE METABOLIC PANEL
Albumin: 3.9 g/dL (ref 3.5–5.2)
BUN: 16 mg/dL (ref 6–23)
CO2: 28 mEq/L (ref 19–32)
Chloride: 92 mEq/L — ABNORMAL LOW (ref 96–112)
Creatinine, Ser: 1 mg/dL (ref 0.50–1.10)
GFR calc Af Amer: 66 mL/min — ABNORMAL LOW (ref >90)
GFR calc non Af Amer: 57 mL/min — ABNORMAL LOW (ref >90)
Glucose, Bld: 91 mg/dL (ref 70–99)
Total Bilirubin: 0.3 mg/dL (ref 0.3–1.2)

## 2011-01-25 LAB — PROTIME-INR: INR: 0.99 (ref 0.00–1.49)

## 2011-01-25 NOTE — Pre-Procedure Instructions (Addendum)
EKG 12-08-10 (not done -last  Done in 2011.Dr. Patty Sermons in system/ CXR 01-24-11 [facility] hospital in [place].

## 2011-01-25 NOTE — Patient Instructions (Signed)
20 Destiny Hughes  01/25/2011   Your procedure is scheduled on:  02-01-11 Report to [facility] at 1200noon  Call this number if you have problems the morning of surgery: [phone]   Remember:   Do not eat food:After Midnight.  Do not drink clear liquids: 6 Hours before arrival.0800am  Take these medicines the morning of surgery with A SIP OF WATER: Lanoxin, Levothyroxine, Inderal, Zoloft, Theophylline, Zpak(if taking)  Do not wear jewelry, make-up or nail polish.  Do not wear lotions, powders, or perfumes. You may wear deodorant.  Do not shave 48 hours prior to surgery.  Do not bring valuables to the hospital.  Contacts, dentures or bridgework may not be worn into surgery.  Leave suitcase in the car. After surgery it may be brought to your room.  For patients admitted to the hospital, checkout time is 11:00 AM the day of discharge.   Patients discharged the day of surgery will not be allowed to drive home.  Name and phone number of your driver: Nikelle Malatesta [phone] Special Instructions: CHG Shower Use Special Wash: 1/2 bottle night before surgery and 1/2 bottle morning of surgery.   Please read over the following fact sheets that you were given: Blood Transfusion Information and MRSA Information, Incentive Spirometry Instructions.

## 2011-01-25 NOTE — Pre-Procedure Instructions (Signed)
Consents for or/ blood products signed with chart 01-25-11

## 2011-01-25 NOTE — Pre-Procedure Instructions (Signed)
20 Destiny Hughes  01/25/2011   Your procedure is scheduled on:  02-01-11  Report to [facility] at 1200noon  Call this number if you have problems the morning of surgery: [phone]   Remember:   Do not eat food:After Midnight.  Do not drink clear liquids: After Midnight.-May have Clear Liquids 12 midnight to 0800(02-01-11) then nothing after  Take these medicines the morning of surgery with A SIP OF WATER: Lanoxin, Levothyroxine, Inderal, Zoloft, Theophylline, Z-pak(if still taking)   Do not wear jewelry, make-up or nail polish.  Do not wear lotions, powders, or perfumes. You may wear deodorant.  Do not shave 48 hours prior to surgery.  Do not bring valuables to the hospital.  Contacts, dentures or bridgework may not be worn into surgery.  Leave suitcase in the car. After surgery it may be brought to your room.  For patients admitted to the hospital, checkout time is 11:00 AM the day of discharge.   Patients discharged the day of surgery will not be allowed to drive home.  Name and phone number of your driver:Bill Stillinger [phone]  Special Instructions: CHG Shower Use Special Wash: 1/2 bottle night before surgery and 1/2 bottle morning of surgery.   Please read over the following fact sheets that you were given: Blood Transfusion Information and MRSA Information, Incentive Spirometry Instructions

## 2011-01-26 NOTE — Pre-Procedure Instructions (Signed)
Surgical clearance note received from Dr. Nancy Fetter with chart 01-26-11-W. Jaire Pinkham,RN.

## 2011-01-29 NOTE — H&P (Signed)
Destiny Hughes is an 65 y.o. female.    Chief Complaint: right knee pain status post   HPI: Pt is a 65 y.o. female complaining of right knee pain for 10+ years. She originally had a partial knee replacement by Dr. Ophelia Charter, which did not resolve her pain. The components were later found to have loosened. Subsequent surgery to replace the tibial component. Patient then had a total knee revision from a uni surgery 2007. Again after the surgery the components were found to have loosened causing her pain. At this time she would like to proceed with a right total knee revision. Options were discussed, she wishes to proceed with surgery. Risks, benefits and expectations were discussed with the patient. Patient understand the risks, benefits and expectations and wishes to proceed with surgery.  PCP:  Quentin Mulling PA-C  D/C Plans: Home with HHPT  Post-op Meds: Rx given for Xarelto, ASA, Robaxin, Celebrex, Iron, Colace and MiraLax  Tranexamic Acid: Not to be given  Past Medical History  Diagnosis Date  . Palpitation   . Tachycardia   . Dyspnea   . Hyperlipemia   . Glaucoma   . Chronic neck pain   . Chronic back pain   . Collapse of right lung   . Dysrhythmia     hx.PAT- tx. Inderal  . Heart murmur   . Hypothyroidism     tx. Levothyroxine  . Blood transfusion     after Thoracic surgery ?'04  . Kidney calculi 01-25-11    1964  . GERD (gastroesophageal reflux disease)     tx. TUMS  . Headache 01-25-11    neck and nerve pain related.  . Neuromuscular disorder 01-25-11    Pain/nerve stimulator implanted -2 yrs ago.  . Arthritis 01-25-11    back and most joints  . Depression   . COPD (chronic obstructive pulmonary disease)     Hx. Bronchitis occ, 01-25-11 somes issue now taking  Z-pak-started 01-24-11/Scar tissue  present  from previous lung collapse    Past Surgical History  Procedure Date  . Spinal cord stimulator implant 2009 APPROX    DR. ELSNER  . Cervical spine surgery 01-25-11      2005-multiple levels  . Thoracic spine surgery 01-25-11    6'02- then nerve stimulator implanted after  . Tubal ligation   . Appendectomy 01-25-11  . Joint replacement 01-25-11    6'03 LTHA-hemi  . Knee arthroplasty 01-25-11    '04-right, revised x2  . Cholecystectomy 01-25-11    '07  . Carpal tunnel release 01-25-11    Bil.    Family History  Problem Relation Age of Onset  . Heart attack Father   . Aneurysm Mother     CEREBRAL   Social History:  reports that she has been smoking Cigarettes.  She has a 25 pack-year smoking history. She does not have any smokeless tobacco history on file. She reports that she does not drink alcohol or use illicit drugs.  Allergies:  Allergies  Allergen Reactions  . Ventolin (HQI:ONGEXBMWU) Anaphylaxis  . Codeine Nausea And Vomiting  . Penicillins Swelling  . Ecotrin (Aspirin) Nausea And Vomiting    No current facility-administered medications on file as of .   Medications Prior to Admission  Medication Sig Dispense Refill  . diazepam (VALIUM) 5 MG tablet Take 5 mg by mouth every 6 (six) hours as needed. MUSCLE SPASM       . digoxin (LANOXIN) 0.25 MG tablet Take 250 mcg by mouth every  morning.       Marland Kitchen levothyroxine (SYNTHROID, LEVOTHROID) 88 MCG tablet Take 88 mcg by mouth daily at 12 noon. PT TAKE BEFORE LUNCH      . oxycodone (OXY-IR) 5 MG capsule Take 5-10 mg by mouth every 6 (six) hours as needed. PAIN      . propranolol (INDERAL) 80 MG tablet Take 80 mg by mouth 2 (two) times daily.       . sertraline (ZOLOFT) 100 MG tablet Take 150 mg by mouth every morning.       . theophylline (THEO-24) 200 MG 24 hr capsule Take 200 mg by mouth 2 (two) times daily as needed. WHEEZING         Review of Systems  Constitutional: Negative.   HENT: Positive for neck pain.   Respiratory: Negative.   Cardiovascular: Negative.   Musculoskeletal: Positive for myalgias, back pain and joint pain.  Skin: Negative.     VITALS: BP - 126/68, Pulse 80,  Resp 18  Physical Exam  Constitutional: She is oriented to person, place, and time. She appears well-nourished.  HENT:  Head: Normocephalic and atraumatic.  Eyes: Conjunctivae and EOM are normal. Pupils are equal, round, and reactive to light.  Neck: Neck supple.  Cardiovascular: Normal rate, regular rhythm and normal heart sounds.  Exam reveals no gallop and no friction rub.   No murmur heard. Respiratory: Effort normal.  GI: Soft.  Musculoskeletal: She exhibits tenderness.       Right knee: She exhibits decreased range of motion and swelling. She exhibits no ecchymosis. tenderness found. Medial joint line and lateral joint line tenderness noted.  Neurological: She is alert and oriented to person, place, and time.  Skin: Skin is warm and dry. No rash noted. No erythema. No pallor.     Assessment: Right total knee   Plan: Pt will undergo a right total knee revision per Dr. Charlann Boxer on 02/01/11 at [facility]. Risks, benefits and expectations were discussed with the patient. Patient understand the risks, benefits and expectations and wishes to proceed with surgery.  Anastasio Auerbach Madelene Kaatz   PAC  01/29/2011, 7:34 PM

## 2011-01-31 ENCOUNTER — Ambulatory Visit (INDEPENDENT_AMBULATORY_CARE_PROVIDER_SITE_OTHER): Payer: Medicare Other | Admitting: Cardiology

## 2011-01-31 ENCOUNTER — Encounter: Payer: Self-pay | Admitting: Cardiology

## 2011-01-31 VITALS — BP 144/72 | HR 58 | Ht 66.0 in | Wt 157.8 lb

## 2011-01-31 DIAGNOSIS — R0609 Other forms of dyspnea: Secondary | ICD-10-CM

## 2011-01-31 DIAGNOSIS — R0989 Other specified symptoms and signs involving the circulatory and respiratory systems: Secondary | ICD-10-CM

## 2011-01-31 DIAGNOSIS — M1990 Unspecified osteoarthritis, unspecified site: Secondary | ICD-10-CM

## 2011-01-31 DIAGNOSIS — R002 Palpitations: Secondary | ICD-10-CM

## 2011-01-31 DIAGNOSIS — R0600 Dyspnea, unspecified: Secondary | ICD-10-CM

## 2011-01-31 NOTE — Assessment & Plan Note (Signed)
The patient has a history of occasional palpitations.  Since last visit she's had no worsening of the symptoms.  She also has chronic musculoskeletal chest wall pain which she relates to having had any operations in 6 years.

## 2011-01-31 NOTE — Assessment & Plan Note (Signed)
The patient has a history of generalized osteoarthritis.  This will be her third operation on her right knee.  She has had 2 prior total knee replacement operations.  Because of her knee problems she is not able to get any regular exercise.

## 2011-01-31 NOTE — Progress Notes (Signed)
Destiny Hughes Date of Birth:  08-Mar-1946 [facility] [address]   [address] [place], [place]  [address] [phone]           Fax   [phone]  History of Present Illness: This pleasant 65 year old woman is seen for a scheduled four-month followup office visit.  This is also a preoperative visit.  The patient has a history of palpitations and atypical chest pain and dyspnea.  She does not have any history of known coronary artery disease.  She had a normal adenosine Cardiolite stress test in 2007.  She had another normal Cardiolite stress test in May of 2011.  Her most recent echocardiogram was on 08/10/09 which showed mild aortic sclerosis and normal systolic function with diastolic dysfunction.  She had normal pulmonary artery pressures.  Since last visit she's had no new cardiac symptoms.  She has had problems with her right knee and is scheduled for a revision of a previous right total knee which will be done tomorrow by Dr. Charlann Boxer.  Current Outpatient Prescriptions  Medication Sig Dispense Refill  . Biotin (BIOTIN 5000) 5 MG CAPS Take 1 capsule by mouth daily.        . Cholecalciferol (VITAMIN D PO) Take by mouth.        . clidinium-chlordiazePOXIDE (LIBRAX) 2.5-5 MG per capsule Take 1 capsule by mouth 2 (two) times daily.       . diazepam (VALIUM) 5 MG tablet Take 5 mg by mouth every 6 (six) hours as needed. MUSCLE SPASM       . digoxin (LANOXIN) 0.25 MG tablet Take 250 mcg by mouth every morning.       . docusate sodium (COLACE) 100 MG capsule Take 100 mg by mouth 3 (three) times daily.       Marland Kitchen guaiFENesin (MUCINEX) 600 MG 12 hr tablet Take 1,200 mg by mouth 2 (two) times daily.        Marland Kitchen levothyroxine (SYNTHROID, LEVOTHROID) 88 MCG tablet Take 88 mcg by mouth daily at 12 noon. PT TAKE BEFORE LUNCH      . methadone (DOLOPHINE) 5 MG tablet Take 5 mg by mouth every 6 (six) hours.       . Multiple Vitamins-Minerals (MULTI-VITAMIN GUMMIES PO) Take 2  tablets by mouth daily.        Marland Kitchen oxycodone (OXY-IR) 5 MG capsule Take 5-10 mg by mouth every 6 (six) hours as needed. PAIN      . promethazine (PHENERGAN) 25 MG tablet Take 25 mg by mouth every 6 (six) hours as needed.        . propranolol (INDERAL) 80 MG tablet Take 80 mg by mouth 2 (two) times daily.       . sertraline (ZOLOFT) 100 MG tablet Take 150 mg by mouth every morning.       . theophylline (THEO-24) 200 MG 24 hr capsule Take 200 mg by mouth 2 (two) times daily as needed. WHEEZING         Allergies  Allergen Reactions  . Ventolin (ZHY:QMVHQIONG) Anaphylaxis  . Codeine Nausea And Vomiting  . Penicillins Swelling  . Ecotrin (Aspirin) Nausea And Vomiting    Patient Active Problem List  Diagnoses  . Palpitation  . Tachycardia  . Dyspnea  . Hyperlipemia  . Glaucoma  . Chronic neck pain  . Chronic back pain    History  Smoking status  . Current Everyday Smoker -- 0.5 packs/day for 50 years  . Types: Cigarettes  Smokeless tobacco  . Not on file    History  Alcohol Use No    Family History  Problem Relation Age of Onset  . Heart attack Father   . Aneurysm Mother     CEREBRAL    Review of Systems: Constitutional: no fever chills diaphoresis or fatigue or change in weight.  Head and neck: no hearing loss, no epistaxis, no photophobia or visual disturbance. Respiratory: No cough, shortness of breath or wheezing. Cardiovascular: No chest pain peripheral edema, palpitations. Gastrointestinal: No abdominal distention, no abdominal pain, no change in bowel habits hematochezia or melena. Genitourinary: No dysuria, no frequency, no urgency, no nocturia. Musculoskeletal:No arthralgias, no back pain, no gait disturbance or myalgias. Neurological: No dizziness, no headaches, no numbness, no seizures, no syncope, no weakness, no tremors. Hematologic: No lymphadenopathy, no easy bruising. Psychiatric: No confusion, no hallucinations, no sleep disturbance.    Physical  Exam: Filed Vitals:   01/31/11 1503  BP: 144/72  Pulse: 58   General appearance reveals a well-developed well-nourished woman in no distress.Pupils equal and reactive.   Extraocular Movements are full.  There is no scleral icterus.  The mouth and pharynx are normal.  The neck is supple.  The carotids reveal no bruits.  The jugular venous pressure is normal.  The thyroid is not enlarged.  There is no lymphadenopathy.  Chest reveals expiratory wheezes bilaterally.The precordium is quiet.  The first heart sound is normal.  The second heart sound is physiologically split.  There is no murmur gallop rub or click.  There is no abnormal lift or heave.  The abdomen is soft and nontender. Bowel sounds are normal. The liver and spleen are not enlarged. There Are no abdominal masses. There are no bruits.  Extremities reveal good pedal pulses.  No phlebitis.  She does have very trace pretibial edema on the right.The skin is warm and dry.  There is no rash.  EKG shows sinus bradycardia with first degree AV block.  She has inferolateral ST-T wave abnormalities.  The tracing is unchanged since 08/04/09  Assessment / Plan: The patient is a satisfactory risk for orthopedic surgery in a.m.

## 2011-01-31 NOTE — Patient Instructions (Signed)
Your physician recommends that you continue on your current medications as directed. Please refer to the Current Medication list given to you today. Your physician recommends that you schedule a follow-up appointment in: 4 months with EKG  

## 2011-01-31 NOTE — Assessment & Plan Note (Signed)
The patient has a history of exertional dyspnea.  She is still smoking about one half pack of cigarettes a day.  She has a nonproductive cough.  She states that she did have a recent preoperative chest x-ray.  She has not had any hemoptysis.

## 2011-02-01 ENCOUNTER — Encounter (HOSPITAL_COMMUNITY): Payer: Self-pay | Admitting: Anesthesiology

## 2011-02-01 ENCOUNTER — Encounter (HOSPITAL_COMMUNITY)
Admission: RE | Disposition: A | Discharge status: Home Health | Payer: Self-pay | Source: Ambulatory Visit | Attending: Orthopedic Surgery

## 2011-02-01 ENCOUNTER — Inpatient Hospital Stay (HOSPITAL_COMMUNITY): Payer: Medicare Other | Admitting: Anesthesiology

## 2011-02-01 ENCOUNTER — Inpatient Hospital Stay (HOSPITAL_COMMUNITY)
Admission: RE | Admit: 2011-02-01 | Discharge: 2011-02-03 | DRG: 468 | Disposition: A | Discharge status: Home Health | Payer: Medicare Other | Source: Ambulatory Visit | Attending: Orthopedic Surgery | Admitting: Orthopedic Surgery

## 2011-02-01 DIAGNOSIS — F17200 Nicotine dependence, unspecified, uncomplicated: Secondary | ICD-10-CM | POA: Diagnosis present

## 2011-02-01 DIAGNOSIS — M25569 Pain in unspecified knee: Secondary | ICD-10-CM | POA: Diagnosis present

## 2011-02-01 DIAGNOSIS — Z96659 Presence of unspecified artificial knee joint: Secondary | ICD-10-CM

## 2011-02-01 DIAGNOSIS — J4489 Other specified chronic obstructive pulmonary disease: Secondary | ICD-10-CM | POA: Diagnosis present

## 2011-02-01 DIAGNOSIS — F329 Major depressive disorder, single episode, unspecified: Secondary | ICD-10-CM | POA: Diagnosis present

## 2011-02-01 DIAGNOSIS — H4089 Other specified glaucoma: Secondary | ICD-10-CM | POA: Diagnosis present

## 2011-02-01 DIAGNOSIS — J449 Chronic obstructive pulmonary disease, unspecified: Secondary | ICD-10-CM | POA: Diagnosis present

## 2011-02-01 DIAGNOSIS — E039 Hypothyroidism, unspecified: Secondary | ICD-10-CM | POA: Diagnosis present

## 2011-02-01 DIAGNOSIS — Z01812 Encounter for preprocedural laboratory examination: Secondary | ICD-10-CM

## 2011-02-01 DIAGNOSIS — T8489XA Other specified complication of internal orthopedic prosthetic devices, implants and grafts, initial encounter: Principal | ICD-10-CM | POA: Diagnosis present

## 2011-02-01 DIAGNOSIS — E785 Hyperlipidemia, unspecified: Secondary | ICD-10-CM | POA: Diagnosis present

## 2011-02-01 DIAGNOSIS — Y838 Other surgical procedures as the cause of abnormal reaction of the patient, or of later complication, without mention of misadventure at the time of the procedure: Secondary | ICD-10-CM | POA: Diagnosis present

## 2011-02-01 DIAGNOSIS — F3289 Other specified depressive episodes: Secondary | ICD-10-CM | POA: Diagnosis present

## 2011-02-01 HISTORY — PX: TOTAL KNEE REVISION: SHX996

## 2011-02-01 LAB — TYPE AND SCREEN: Antibody Screen: NEGATIVE

## 2011-02-01 LAB — GRAM STAIN: Gram Stain: NONE SEEN

## 2011-02-01 SURGERY — TOTAL KNEE REVISION
Anesthesia: Spinal | Site: Knee | Laterality: Right | Wound class: Clean

## 2011-02-01 MED ORDER — HYDROMORPHONE HCL PF 2 MG/ML IJ SOLN
INTRAMUSCULAR | Status: AC
  Administered 2011-02-01: 2 mg
  Filled 2011-02-01: qty 1

## 2011-02-01 MED ORDER — MENTHOL 3 MG MT LOZG: 1.0000 | LOZENGE | OROMUCOSAL | Status: DC | PRN

## 2011-02-01 MED ORDER — PROPRANOLOL HCL 80 MG PO TABS
80.0000 mg | ORAL_TABLET | Freq: Two times a day (BID) | ORAL | Status: DC
  Administered 2011-02-01 – 2011-02-02 (×3): 80 mg via ORAL
  Filled 2011-02-01 (×6): qty 1

## 2011-02-01 MED ORDER — DIGOXIN 250 MCG PO TABS
250.0000 ug | ORAL_TABLET | Freq: Every day | ORAL | Status: DC
  Administered 2011-02-02 – 2011-02-03 (×2): 250 ug via ORAL
  Filled 2011-02-01 (×2): qty 1

## 2011-02-01 MED ORDER — METHOCARBAMOL 500 MG PO TABS
500.0000 mg | ORAL_TABLET | Freq: Four times a day (QID) | ORAL | Status: DC | PRN
  Administered 2011-02-02 (×2): 500 mg via ORAL
  Filled 2011-02-01 (×2): qty 1

## 2011-02-01 MED ORDER — DIPHENHYDRAMINE HCL 25 MG PO CAPS: 25.0000 mg | ORAL_CAPSULE | Freq: Four times a day (QID) | ORAL | Status: DC | PRN

## 2011-02-01 MED ORDER — CLINDAMYCIN PHOSPHATE 600 MG/50ML IV SOLN
600.0000 mg | INTRAVENOUS | Status: AC
  Administered 2011-02-01: 600 mg via INTRAVENOUS
  Filled 2011-02-01: qty 50

## 2011-02-01 MED ORDER — DIAZEPAM 5 MG PO TABS: 5.0000 mg | ORAL_TABLET | Freq: Four times a day (QID) | ORAL | Status: DC | PRN

## 2011-02-01 MED ORDER — METHADONE HCL 5 MG PO TABS
5.0000 mg | ORAL_TABLET | Freq: Four times a day (QID) | ORAL | Status: DC
  Administered 2011-02-01 – 2011-02-03 (×7): 5 mg via ORAL
  Filled 2011-02-01 (×7): qty 1

## 2011-02-01 MED ORDER — LACTATED RINGERS IV SOLN
Freq: Once | INTRAVENOUS | Status: AC
  Administered 2011-02-01: 17:00:00 via INTRAVENOUS

## 2011-02-01 MED ORDER — METHOCARBAMOL 100 MG/ML IJ SOLN
500.0000 mg | Freq: Four times a day (QID) | INTRAVENOUS | Status: DC | PRN
  Administered 2011-02-01: 500 mg via INTRAVENOUS
  Filled 2011-02-01: qty 5

## 2011-02-01 MED ORDER — RIVAROXABAN 10 MG PO TABS
10.0000 mg | ORAL_TABLET | ORAL | Status: DC
  Administered 2011-02-02: 10 mg via ORAL
  Filled 2011-02-01 (×2): qty 1

## 2011-02-01 MED ORDER — SERTRALINE HCL 50 MG PO TABS
150.0000 mg | ORAL_TABLET | Freq: Every day | ORAL | Status: DC
  Administered 2011-02-02 – 2011-02-03 (×2): 150 mg via ORAL
  Filled 2011-02-01 (×2): qty 1

## 2011-02-01 MED ORDER — SENNOSIDES-DOCUSATE SODIUM 8.6-50 MG PO TABS
1.0000 | ORAL_TABLET | Freq: Every evening | ORAL | Status: DC | PRN
  Filled 2011-02-01: qty 1

## 2011-02-01 MED ORDER — ONDANSETRON HCL 4 MG/2ML IJ SOLN: 4.0000 mg | Freq: Four times a day (QID) | INTRAMUSCULAR | Status: DC | PRN

## 2011-02-01 MED ORDER — FLEET ENEMA 7-19 GM/118ML RE ENEM: 1.0000 | ENEMA | Freq: Every day | RECTAL | Status: DC | PRN

## 2011-02-01 MED ORDER — BUPIVACAINE HCL 0.75 % IJ SOLN
INTRAMUSCULAR | Status: DC | PRN
  Administered 2011-02-01: 11 mg

## 2011-02-01 MED ORDER — HYDROMORPHONE HCL PF 1 MG/ML IJ SOLN
INTRAMUSCULAR | Status: AC
  Administered 2011-02-02: 1 mg via INTRAVENOUS
  Filled 2011-02-01: qty 1

## 2011-02-01 MED ORDER — BISACODYL 10 MG RE SUPP: 10.0000 mg | Freq: Every day | RECTAL | Status: DC | PRN

## 2011-02-01 MED ORDER — PROPOFOL 10 MG/ML IV EMUL
INTRAVENOUS | Status: DC | PRN
  Administered 2011-02-01: 75 ug/kg/min via INTRAVENOUS

## 2011-02-01 MED ORDER — BISACODYL 5 MG PO TBEC: 10.0000 mg | DELAYED_RELEASE_TABLET | Freq: Every day | ORAL | Status: DC | PRN

## 2011-02-01 MED ORDER — MIDAZOLAM HCL 5 MG/5ML IJ SOLN
INTRAMUSCULAR | Status: DC | PRN
  Administered 2011-02-01 (×2): 1 mg via INTRAVENOUS

## 2011-02-01 MED ORDER — LIDOCAINE HCL (CARDIAC) 20 MG/ML IV SOLN
INTRAVENOUS | Status: DC | PRN
  Administered 2011-02-01: 50 mg via INTRAVENOUS

## 2011-02-01 MED ORDER — POLYETHYLENE GLYCOL 3350 17 G PO PACK
17.0000 g | PACK | Freq: Every day | ORAL | Status: DC
  Administered 2011-02-01 – 2011-02-03 (×3): 17 g via ORAL
  Filled 2011-02-01 (×3): qty 1

## 2011-02-01 MED ORDER — FENTANYL CITRATE 0.05 MG/ML IJ SOLN
INTRAMUSCULAR | Status: DC | PRN
  Administered 2011-02-01 (×2): 25 ug via INTRAVENOUS
  Administered 2011-02-01: 50 ug via INTRAVENOUS

## 2011-02-01 MED ORDER — METOCLOPRAMIDE HCL 10 MG PO TABS: 5.0000 mg | ORAL_TABLET | Freq: Three times a day (TID) | ORAL | Status: DC | PRN

## 2011-02-01 MED ORDER — POTASSIUM CHLORIDE 2 MEQ/ML IV SOLN
INTRAVENOUS | Status: DC
  Administered 2011-02-01 – 2011-02-02 (×4): via INTRAVENOUS
  Filled 2011-02-01 (×11): qty 1000

## 2011-02-01 MED ORDER — SENNA 8.6 MG PO TABS
1.0000 | ORAL_TABLET | Freq: Two times a day (BID) | ORAL | Status: DC
  Administered 2011-02-02 – 2011-02-03 (×3): 8.6 mg via ORAL
  Filled 2011-02-01 (×3): qty 1

## 2011-02-01 MED ORDER — ACETAMINOPHEN 325 MG PO TABS: 650.0000 mg | ORAL_TABLET | Freq: Four times a day (QID) | ORAL | Status: DC | PRN

## 2011-02-01 MED ORDER — METOCLOPRAMIDE HCL 5 MG/ML IJ SOLN
5.0000 mg | Freq: Three times a day (TID) | INTRAMUSCULAR | Status: DC | PRN
  Filled 2011-02-01: qty 2

## 2011-02-01 MED ORDER — HYDROMORPHONE HCL PF 1 MG/ML IJ SOLN
0.2500 mg | INTRAMUSCULAR | Status: DC | PRN
  Administered 2011-02-01 (×3): 0.5 mg via INTRAVENOUS

## 2011-02-01 MED ORDER — CILIDINIUM-CHLORDIAZEPOXIDE 2.5-5 MG PO CAPS
1.0000 | ORAL_CAPSULE | Freq: Two times a day (BID) | ORAL | Status: DC
  Administered 2011-02-01 – 2011-02-03 (×4): 1 via ORAL
  Filled 2011-02-01 (×5): qty 1

## 2011-02-01 MED ORDER — CLINDAMYCIN PHOSPHATE 600 MG/50ML IV SOLN
600.0000 mg | Freq: Four times a day (QID) | INTRAVENOUS | Status: AC
  Administered 2011-02-01 – 2011-02-02 (×3): 600 mg via INTRAVENOUS
  Filled 2011-02-01 (×4): qty 50

## 2011-02-01 MED ORDER — ZOLPIDEM TARTRATE 5 MG PO TABS: 5.0000 mg | ORAL_TABLET | Freq: Every evening | ORAL | Status: DC | PRN

## 2011-02-01 MED ORDER — ONDANSETRON HCL 4 MG PO TABS: 4.0000 mg | ORAL_TABLET | Freq: Four times a day (QID) | ORAL | Status: DC | PRN

## 2011-02-01 MED ORDER — DOCUSATE SODIUM 100 MG PO CAPS: 100.0000 mg | ORAL_CAPSULE | Freq: Two times a day (BID) | ORAL | Status: DC

## 2011-02-01 MED ORDER — PROMETHAZINE HCL 25 MG/ML IJ SOLN: 6.2500 mg | INTRAMUSCULAR | Status: DC | PRN

## 2011-02-01 MED ORDER — OXYCODONE HCL 5 MG PO TABS
5.0000 mg | ORAL_TABLET | ORAL | Status: DC | PRN
  Administered 2011-02-03: 10 mg via ORAL
  Filled 2011-02-01: qty 2

## 2011-02-01 MED ORDER — DOCUSATE SODIUM 100 MG PO CAPS
100.0000 mg | ORAL_CAPSULE | Freq: Three times a day (TID) | ORAL | Status: DC
  Administered 2011-02-01 – 2011-02-03 (×6): 100 mg via ORAL
  Filled 2011-02-01 (×7): qty 1

## 2011-02-01 MED ORDER — PROPOFOL 10 MG/ML IV EMUL
INTRAVENOUS | Status: DC | PRN
  Administered 2011-02-01 (×2): 20 mg via INTRAVENOUS

## 2011-02-01 MED ORDER — FERROUS SULFATE 325 (65 FE) MG PO TABS
325.0000 mg | ORAL_TABLET | Freq: Three times a day (TID) | ORAL | Status: DC
  Administered 2011-02-02 – 2011-02-03 (×5): 325 mg via ORAL
  Filled 2011-02-01 (×6): qty 1

## 2011-02-01 MED ORDER — HYDROMORPHONE HCL PF 1 MG/ML IJ SOLN
0.5000 mg | INTRAMUSCULAR | Status: DC | PRN
Start: 2011-02-01 — End: 2011-02-03
  Administered 2011-02-02: 1 mg via INTRAVENOUS
  Administered 2011-02-02 (×2): 2 mg via INTRAVENOUS
  Filled 2011-02-01: qty 1
  Filled 2011-02-01 (×2): qty 2
  Filled 2011-02-01: qty 1
  Filled 2011-02-01: qty 2

## 2011-02-01 MED ORDER — ALUM & MAG HYDROXIDE-SIMETH 200-200-20 MG/5ML PO SUSP: 30.0000 mL | ORAL | Status: DC | PRN

## 2011-02-01 MED ORDER — LACTATED RINGERS IV SOLN
INTRAVENOUS | Status: DC | PRN
  Administered 2011-02-01 (×2): via INTRAVENOUS

## 2011-02-01 MED ORDER — ACETAMINOPHEN 10 MG/ML IV SOLN
INTRAVENOUS | Status: DC | PRN
  Administered 2011-02-01: 1000 mg via INTRAVENOUS

## 2011-02-01 MED ORDER — LEVOTHYROXINE SODIUM 88 MCG PO TABS
88.0000 ug | ORAL_TABLET | Freq: Every day | ORAL | Status: DC
  Filled 2011-02-01: qty 1

## 2011-02-01 MED ORDER — LEVOTHYROXINE SODIUM 88 MCG PO TABS
88.0000 ug | ORAL_TABLET | Freq: Every day | ORAL | Status: DC
  Administered 2011-02-02 – 2011-02-03 (×2): 88 ug via ORAL
  Filled 2011-02-01 (×2): qty 1

## 2011-02-01 MED ORDER — ACETAMINOPHEN 650 MG RE SUPP: 650.0000 mg | Freq: Four times a day (QID) | RECTAL | Status: DC | PRN

## 2011-02-01 MED ORDER — PHENOL 1.4 % MT LIQD: 1.0000 | OROMUCOSAL | Status: DC | PRN

## 2011-02-01 MED ORDER — THEOPHYLLINE ER 200 MG PO CP24
200.0000 mg | ORAL_CAPSULE | Freq: Two times a day (BID) | ORAL | Status: DC | PRN
  Filled 2011-02-01: qty 1

## 2011-02-01 SURGICAL SUPPLY — 79 items
ADAPTER BOLT FEMORAL +2/-2 (Knees) ×1 IMPLANT
ADH SKN CLS APL DERMABOND .7 (GAUZE/BANDAGES/DRESSINGS) ×1
ADPR FEM +2/-2 OFST BOLT (Knees) ×1 IMPLANT
ADPR FEM 5D STRL KN PFC SGM (Orthopedic Implant) ×1 IMPLANT
AUG FEM SZ3 4 CMB POST STRL LF (Knees) ×1 IMPLANT
BAG SPEC THK2 15X12 ZIP CLS (MISCELLANEOUS) ×1
BAG ZIPLOCK 12X15 (MISCELLANEOUS) ×2 IMPLANT
BANDAGE ELASTIC 6 VELCRO ST LF (GAUZE/BANDAGES/DRESSINGS) ×2 IMPLANT
BANDAGE ESMARK 6X9 LF (GAUZE/BANDAGES/DRESSINGS) ×1 IMPLANT
BLADE SAW SGTL 13.0X1.19X90.0M (BLADE) ×2 IMPLANT
BLADE SAW SGTL 81X20 HD (BLADE) ×2 IMPLANT
BNDG CMPR 9X6 STRL LF SNTH (GAUZE/BANDAGES/DRESSINGS) ×1
BNDG ESMARK 6X9 LF (GAUZE/BANDAGES/DRESSINGS) ×2
BONE CEMENT GENTAMICIN (Cement) ×4 IMPLANT
BOWL SMART MIX CTS (DISPOSABLE) IMPLANT
CLOTH BEACON ORANGE TIMEOUT ST (SAFETY) ×2 IMPLANT
COMP FEM CEM RT SZ3 (Orthopedic Implant) ×2 IMPLANT
COMPONENT FEM CEM RT SZ3 (Orthopedic Implant) ×1 IMPLANT
CUFF TOURN SGL QUICK 34 (TOURNIQUET CUFF) ×2
CUFF TRNQT CYL 34X4X40X1 (TOURNIQUET CUFF) ×1 IMPLANT
DERMABOND ADVANCED (GAUZE/BANDAGES/DRESSINGS) ×1
DERMABOND ADVANCED .7 DNX12 (GAUZE/BANDAGES/DRESSINGS) IMPLANT
DRAPE EXTREMITY T 121X128X90 (DRAPE) ×2 IMPLANT
DRAPE POUCH INSTRU U-SHP 10X18 (DRAPES) ×2 IMPLANT
DRAPE U-SHAPE 47X51 STRL (DRAPES) ×2 IMPLANT
DRSG ADAPTIC 3X8 NADH LF (GAUZE/BANDAGES/DRESSINGS) ×2 IMPLANT
DRSG AQUACEL AG ADV 3.5X10 (GAUZE/BANDAGES/DRESSINGS) ×2 IMPLANT
DRSG PAD ABDOMINAL 8X10 ST (GAUZE/BANDAGES/DRESSINGS) ×2 IMPLANT
DRSG TEGADERM 4X4.75 (GAUZE/BANDAGES/DRESSINGS) ×1 IMPLANT
DURAPREP 26ML APPLICATOR (WOUND CARE) ×2 IMPLANT
ELECT REM PT RETURN 9FT ADLT (ELECTROSURGICAL) ×2
ELECTRODE REM PT RTRN 9FT ADLT (ELECTROSURGICAL) ×1 IMPLANT
EVACUATOR 1/8 PVC DRAIN (DRAIN) ×2 IMPLANT
FACESHIELD LNG OPTICON STERILE (SAFETY) ×10 IMPLANT
FEMORAL ADAPTER (Orthopedic Implant) ×1 IMPLANT
GAUZE SPONGE 2X2 8PLY STRL LF (GAUZE/BANDAGES/DRESSINGS) IMPLANT
GLOVE BIOGEL PI IND STRL 7.5 (GLOVE) ×1 IMPLANT
GLOVE BIOGEL PI IND STRL 8 (GLOVE) ×1 IMPLANT
GLOVE BIOGEL PI IND STRL 8.5 (GLOVE) ×1 IMPLANT
GLOVE BIOGEL PI INDICATOR 7.5 (GLOVE) ×1
GLOVE BIOGEL PI INDICATOR 8 (GLOVE) ×1
GLOVE BIOGEL PI INDICATOR 8.5 (GLOVE) ×1
GLOVE ORTHO TXT STRL SZ7.5 (GLOVE) ×4 IMPLANT
GLOVE SURG ORTHO 8.0 STRL STRW (GLOVE) ×2 IMPLANT
GOWN STRL NON-REIN LRG LVL3 (GOWN DISPOSABLE) ×2 IMPLANT
HANDPIECE INTERPULSE COAX TIP (DISPOSABLE) ×2
IMMOBILIZER KNEE 20 (SOFTGOODS)
IMMOBILIZER KNEE 20 THIGH 36 (SOFTGOODS) IMPLANT
INSERT TIBIAL TC3 15.0 (Knees) ×1 IMPLANT
KIT BASIN OR (CUSTOM PROCEDURE TRAY) ×2 IMPLANT
MANIFOLD NEPTUNE II (INSTRUMENTS) ×2 IMPLANT
NDL SAFETY ECLIPSE 18X1.5 (NEEDLE) ×1 IMPLANT
NEEDLE HYPO 18GX1.5 SHARP (NEEDLE) ×2
NS IRRIG 1000ML POUR BTL (IV SOLUTION) ×2 IMPLANT
PACK TOTAL JOINT (CUSTOM PROCEDURE TRAY) ×2 IMPLANT
PADDING CAST COTTON 6X4 STRL (CAST SUPPLIES) ×4 IMPLANT
PADDING WEBRIL 6 STERILE (GAUZE/BANDAGES/DRESSINGS) ×1 IMPLANT
PATELLA DOME PFC 38MM (Knees) ×2 IMPLANT
POSITIONER SURGICAL ARM (MISCELLANEOUS) ×2 IMPLANT
POST AVE PFC 4MM (Knees) ×1 IMPLANT
RESTRICTOR CEMENT SZ 5 C-STEM (Cement) ×1 IMPLANT
SET HNDPC FAN SPRY TIP SCT (DISPOSABLE) ×1 IMPLANT
SET PAD KNEE POSITIONER (MISCELLANEOUS) ×2 IMPLANT
SPONGE GAUZE 2X2 STER 10/PKG (GAUZE/BANDAGES/DRESSINGS) ×1
SPONGE GAUZE 4X4 12PLY (GAUZE/BANDAGES/DRESSINGS) ×4 IMPLANT
SPONGE GAUZE 4X4 16PLY NONSTR (GAUZE/BANDAGES/DRESSINGS) ×1 IMPLANT
SPONGE LAP 18X18 X RAY DECT (DISPOSABLE) ×2 IMPLANT
STAPLER VISISTAT 35W (STAPLE) ×2 IMPLANT
STEM TIBIA PFC 13X30MM (Stem) ×1 IMPLANT
SUCTION FRAZIER 12FR DISP (SUCTIONS) ×2 IMPLANT
SUT VIC AB 1 CT1 36 (SUTURE) ×6 IMPLANT
SUT VIC AB 2-0 CT1 27 (SUTURE) ×6
SUT VIC AB 2-0 CT1 TAPERPNT 27 (SUTURE) ×3 IMPLANT
SYR 50ML LL SCALE MARK (SYRINGE) ×2 IMPLANT
TOWEL OR 17X26 10 PK STRL BLUE (TOWEL DISPOSABLE) ×4 IMPLANT
TOWER CARTRIDGE SMART MIX (DISPOSABLE) ×2 IMPLANT
TRAY FOLEY CATH 14FRSI W/METER (CATHETERS) ×2 IMPLANT
WATER STERILE IRR 1500ML POUR (IV SOLUTION) ×2 IMPLANT
WRAP KNEE MAXI GEL POST OP (GAUZE/BANDAGES/DRESSINGS) ×2 IMPLANT

## 2011-02-01 NOTE — Brief Op Note (Signed)
02/01/2011  11:12 PM  PATIENT:  Destiny Hughes  65 y.o. female  PRE-OPERATIVE DIAGNOSIS:  painful loose hardware right knee  POST-OPERATIVE DIAGNOSIS:  painful loose hardware right knee  PROCEDURE:  Procedure(s): Right TOTAL KNEE REVISION  SURGEON:  Surgeon(s): Shelda Pal  PHYSICIAN ASSISTANT:   ASSISTANTS: Lanney Gins, PA-C   ANESTHESIA:   spinal  EBL:   150cc  BLOOD ADMINISTERED:none  DRAINS: (1) Hemovact drain(s) in the right knee with  Suction Open   LOCAL MEDICATIONS USED:  NONE  SPECIMEN:  Source of Specimen:  joint fluid from right knee sent to lab for gram stain and culture  DISPOSITION OF SPECIMEN:  micro  COUNTS:  YES  TOURNIQUET:   Total Tourniquet Time Documented: Thigh (Right) - 58 minutes  DICTATION: .Other Dictation: Dictation Number [id number]  PLAN OF CARE: Admit to inpatient   PATIENT DISPOSITION:  PACU - hemodynamically stable.   Delay start of Pharmacological VTE agent (>24hrs) due to surgical blood loss or risk of bleeding:  {YES/NO/NOT APPLICABLE:20182

## 2011-02-01 NOTE — Interval H&P Note (Signed)
History and Physical Interval Note:   02/01/2011   1:18 PM   Destiny Hughes  has presented today for surgery, with the diagnosis of Loose Painful Right Total Knee  The various methods of treatment have been discussed with the patient and family. After consideration of risks, benefits and other options for treatment, the patient has consented to  Procedure(s): Right TOTAL KNEE REVISION as a surgical intervention .  The patients' history has been reviewed, patient examined, no change in status, stable for surgery.  I have reviewed the patients' chart and labs.  Questions were answered to the patient's satisfaction.     Shelda Pal  MD

## 2011-02-01 NOTE — Preoperative (Signed)
Beta Blockers   Reason not to administer Beta Blockers:took this AM

## 2011-02-01 NOTE — Anesthesia Postprocedure Evaluation (Signed)
  Anesthesia Post-op Note  Patient: Destiny Hughes  Procedure(s) Performed:  TOTAL KNEE REVISION  Patient Location: PACU  Anesthesia Type: Spinal  Level of Consciousness: oriented and sedated  Airway and Oxygen Therapy: Patient Spontanous Breathing and Patient connected to nasal cannula oxygen  Post-op Pain: mild  Post-op Assessment: Post-op Vital signs reviewed, Patient's Cardiovascular Status Stable, Respiratory Function Stable and Patent Airway  Post-op Vital Signs: stable  Complications: No apparent anesthesia complications

## 2011-02-01 NOTE — Anesthesia Preprocedure Evaluation (Addendum)
Anesthesia Evaluation  Patient identified by MRN, date of birth, ID band Patient awake    Reviewed: Allergy & Precautions, H&P , NPO status , Patient's Chart, lab work & pertinent test results, reviewed documented beta blocker date and time   History of Anesthesia Complications (+) DIFFICULT AIRWAY  Airway Mallampati: III TM Distance: >3 FB Neck ROM: Limited    Dental   Pulmonary neg pulmonary ROS, COPDCurrent Smoker,    + decreased breath sounds      Cardiovascular Regular Normal Hx PAT, Rx-inderal   Neuro/Psych S/p C-fusion Negative Neurological ROS  Negative Psych ROS   GI/Hepatic negative GI ROS, Neg liver ROS,   Endo/Other  Hypothyroidism Thyroid replacement  Renal/GU negative Renal ROS  Genitourinary negative   Musculoskeletal negative musculoskeletal ROS (+)   Abdominal   Peds negative pediatric ROS (+)  Hematology negative hematology ROS (+)   Anesthesia Other Findings Limited neck ext.   Reproductive/Obstetrics negative OB ROS                          Anesthesia Physical Anesthesia Plan  ASA: III  Anesthesia Plan: Spinal   Post-op Pain Management:    Induction:   Airway Management Planned: Mask  Additional Equipment:   Intra-op Plan:   Post-operative Plan:   Informed Consent: I have reviewed the patients History and Physical, chart, labs and discussed the procedure including the risks, benefits and alternatives for the proposed anesthesia with the patient or authorized representative who has indicated his/her understanding and acceptance.     Plan Discussed with: CRNA and Surgeon  Anesthesia Plan Comments: (Technical difficulties w/ regional due to prior surgeries. However, pt has COPD and a possible difficult airway.)        Anesthesia Quick Evaluation

## 2011-02-01 NOTE — Transfer of Care (Signed)
Immediate Anesthesia Transfer of Care Note  Patient: Destiny Hughes  Procedure(s) Performed:  TOTAL KNEE REVISION  Patient Location: PACU  Anesthesia Type: Regional  Level of Consciousness: sedated  Airway & Oxygen Therapy: Patient Spontanous Breathing and Patient connected to face mask oxygen  Post-op Assessment: Report given to PACU RN and Post -op Vital signs reviewed and stable  Post vital signs: Reviewed and stable  Complications: No apparent anesthesia complications

## 2011-02-01 NOTE — Anesthesia Procedure Notes (Addendum)
Spinal  Patient location during procedure: OR Start time: 02/01/2011 1:30 PM End time: 02/01/2011 1:37 PM Staffing Performed by: anesthesiologist  Preanesthetic Checklist Completed: patient identified, site marked, surgical consent, pre-op evaluation, timeout performed, IV checked, risks and benefits discussed and monitors and equipment checked Spinal Block Patient position: sitting Prep: Betadine Patient monitoring: heart rate, continuous pulse ox and blood pressure Approach: right paramedian Location: L2-3 Injection technique: single-shot Needle Needle type: Spinocan  Needle gauge: 25 G Needle length: 9 cm Needle insertion depth: 1.8 cm Assessment Sensory level: T6 Additional Notes Expiration date of kit checked and confirmed. Patient tolerated procedure well, without complications. [phone]  Placed per MD Shireen Quan

## 2011-02-02 ENCOUNTER — Encounter (HOSPITAL_COMMUNITY): Payer: Self-pay | Admitting: *Deleted

## 2011-02-02 LAB — BASIC METABOLIC PANEL
BUN: 12 mg/dL (ref 6–23)
Calcium: 8 mg/dL — ABNORMAL LOW (ref 8.4–10.5)
GFR calc Af Amer: >90 mL/min (ref >90)
GFR calc non Af Amer: 85 mL/min — ABNORMAL LOW (ref >90)
Glucose, Bld: 153 mg/dL — ABNORMAL HIGH (ref 70–99)
Sodium: 128 mEq/L — ABNORMAL LOW (ref 135–145)

## 2011-02-02 LAB — CBC
Hemoglobin: 10.8 g/dL — ABNORMAL LOW (ref 12.0–15.0)
MCH: 30.3 pg (ref 26.0–34.0)
MCHC: 33.5 g/dL (ref 30.0–36.0)
RDW: 13.7 % (ref 11.5–15.5)

## 2011-02-02 NOTE — Progress Notes (Signed)
Utilization review completed.  

## 2011-02-02 NOTE — Progress Notes (Signed)
Physical Therapy Evaluation Patient Details Name: Destiny Hughes MRN: [id number] DOB: 1945-08-15 Today's Date: 02/02/2011 10:50-11:15 EVll  PT Recommendation Follow Up Recommendations: Home health PT Equipment Recommended: None recommended by PT  Problem List:  Patient Active Problem List  Diagnoses  . Palpitation  . Tachycardia  . Dyspnea  . Hyperlipemia  . Glaucoma  . Chronic neck pain  . Chronic back pain  . Dyspnea on effort  . Osteoarthritis    Past Medical History:  Past Medical History  Diagnosis Date  . Palpitation   . Tachycardia   . Dyspnea   . Hyperlipemia   . Glaucoma   . Chronic neck pain   . Chronic back pain   . Collapse of right lung   . Dysrhythmia     hx.PAT- tx. Inderal  . Heart murmur   . Hypothyroidism     tx. Levothyroxine  . Blood transfusion     after Thoracic surgery ?'04  . Kidney calculi 01-25-11    1964  . GERD (gastroesophageal reflux disease)     tx. TUMS  . Headache 01-25-11    neck and nerve pain related.  . Neuromuscular disorder 01-25-11    Pain/nerve stimulator implanted -2 yrs ago.  . Arthritis 01-25-11    back and most joints  . Depression   . COPD (chronic obstructive pulmonary disease)     Hx. Bronchitis occ, 01-25-11 somes issue now taking  Z-pak-started 01-24-11/Scar tissue  present  from previous lung collapse   Past Surgical History:  Past Surgical History  Procedure Date  . Spinal cord stimulator implant 2009 APPROX    DR. ELSNER  . Cervical spine surgery 01-25-11    2005-multiple levels  . Thoracic spine surgery 01-25-11    6'02- then nerve stimulator implanted after  . Tubal ligation   . Appendectomy 01-25-11  . Joint replacement 01-25-11    6'03 LTHA-hemi  . Knee arthroplasty 01-25-11    '04-right, revised x2  . Cholecystectomy 01-25-11    '07  . Carpal tunnel release 01-25-11    Bil.    PT Assessment/Plan/Recommendation PT Assessment Clinical Impression Statement: pateint with 3rd surgery on right  knee and history of other pain issues has supportive husband at home.  Should progress according to path and d/c to home with husband and HHPT PT Recommendation/Assessment: Patient will need skilled PT in the acute care venue PT Problem List: Decreased strength;Decreased activity tolerance;Decreased mobility;Pain Barriers to Discharge: None PT Therapy Diagnosis : Difficulty walking;Generalized weakness;Acute pain PT Plan PT Frequency: 7X/week PT Treatment/Interventions: DME instruction;Gait training;Stair training;Functional mobility training;Therapeutic exercise;Patient/family education PT Recommendation Follow Up Recommendations: Home health PT Equipment Recommended: None recommended by PT PT Goals  Acute Rehab PT Goals PT Goal Formulation: With patient/family Time For Goal Achievement: 7 days Pt will go Supine/Side to Sit: with min assist PT Goal: Supine/Side to Sit - Progress: Not met Pt will go Sit to Supine/Side: with min assist PT Goal: Sit to Supine/Side - Progress: Not met Pt will Transfer Sit to Stand/Stand to Sit: with min assist PT Transfer Goal: Sit to Stand/Stand to Sit - Progress: Not met Pt will Ambulate: 16 - 50 feet;with min assist;with least restrictive assistive device PT Goal: Ambulate - Progress: Not met Pt will Go Up / Down Stairs: 1-2 stairs;with min assist;with least restrictive assistive device PT Goal: Up/Down Stairs - Progress: Not met Pt will Perform Home Exercise Program: with min assist PT Goal: Perform Home Exercise Program - Progress: Not met  PT  Evaluation Precautions/Restrictions  Precautions Required Braces or Orthoses: Yes Knee Immobilizer: On except when in CPM Restrictions RLE Weight Bearing: Weight bearing as tolerated Prior Functioning  Home Living Lives With: Spouse Receives Help From: Family Type of Home: House Home Layout: One level Home Access: Stairs to enter Entrance Stairs-Rails: Doctor, general practice of Steps:  2 Home Adaptive Equipment: Walker - rolling;Bedside commode/3-in-1 Additional Comments: Husband involved in pt care and can assist her Prior Function Level of Independence: Needs assistance with gait (husband assists) Cognition Cognition Arousal/Alertness: Lethargic Overall Cognitive Status:  (pt falls asleep easily) Orientation Level: Oriented X4 Sensation/Coordination   Extremity Assessment RLE Assessment RLE Assessment:  (post op dressing, hemovac in place) LLE Assessment LLE Assessment: Within Functional Limits Mobility (including Balance) Bed Mobility Bed Mobility: Yes Supine to Sit: 3: Mod assist Supine to Sit Details (indicate cue type and reason): assist to move right leg Sitting - Scoot to Edge of Bed: 3: Mod assist Transfers Transfers: Yes Sit to Stand: 4: Min assist Stand to Sit: 4: Min assist Ambulation/Gait Ambulation/Gait Assistance: 4: Min assist Ambulation/Gait Assistance Details (indicate cue type and reason): pt familiar with using RW Ambulation Distance (Feet): 3 Feet Assistive device: Rolling walker    Exercise    End of Session PT - End of Session Equipment Utilized During Treatment: Right knee immobilizer (RW) Activity Tolerance: Patient limited by fatigue;Patient limited by pain Patient left: in chair;with call bell in reach;with family/visitor present Nurse Communication: Mobility status for transfers;Mobility status for ambulation General Behavior During Session: [facility] for tasks performed Cognition: [facility] for tasks performed  [name] 02/02/2011, 12:08 PM

## 2011-02-02 NOTE — Op Note (Signed)
NAMEMarland Kitchen  RONNICA, DREESE           ACCOUNT NO.:  [id number]  MEDICAL RECORD NO.:  [id number]  LOCATION:  1340                         FACILITY:  [facility]  PHYSICIAN:  Madlyn Frankel. Charlann Boxer, M.D.  DATE OF BIRTH:  [birth year]  DATE OF PROCEDURE:  02/01/2011 DATE OF DISCHARGE:                              OPERATIVE REPORT   PREOPERATIVE DIAGNOSIS:  Painful right total knee.  POSTOPERATIVE DIAGNOSIS:  Painful right total knee.  Findings, please note that the patient's tibia though noted by a bone scan to have a slight increased uptake, was very stable.  Significant attempt to try and remove this was not possible.  I felt that attempts at further removal could have caused further issues.  PROCEDURE:  Right total knee revision utilizing DePuy components, size 3, PC 3 femur with a 4 mm posterior medial augment, a size 13 x 30 stem with gentamicin impregnated cement as well as revision patellar button, size 38.  SURGEON:  Madlyn Frankel. Charlann Boxer, M.D.  ASSISTANT:  Freddie Breech, PAC.  ANESTHESIA:  Spinal.  SPECIMENS:  Joint fluid was taken from the culture, noted to be clear, somewhat stringy in nature, but no signs of obvious purulence.  DRAINS:  One Hemovac.  TOURNIQUET TIME:  58 minutes at 250 mmHg.  INDICATIONS FOR PROCEDURE:  Ms. Kohli is a 65 year old patient of mine who had previously done revision surgery a little over 2 years ago for ligament and global instability.  At that time, I revised her tibia to a MBT revision tray with a cemented sleeve improving her overall knee stability.  She has had a recurrence of symptoms of pain and a workup for this now 2 years after her revision surgery.  A bone scan was ordered indicating some slight increased uptake around the prosthesis and after a lengthy discussion and reviewing with her these findings as well as her persistent pain, she wished at this point to proceed with revision surgery.  Risks of persistent problems, infection, DVT,  need for future surgery were all discussed and reviewed in this setting. Consent was obtained.  PROCEDURE IN DETAIL:  The patient was brought to the operative theater. Once adequate anesthesia, preoperative antibiotics, Ancef was administered she was positioned supine with a right thigh tourniquet placed.  The right lower extremity was then prepped and draped in sterile fashion.  A time-out was performed identifying the patient, planned procedure, and extremity.  The right foot was placed in the [facility] legholder.  The right lower extremity was then exsanguinated and tourniquet elevated to 250 mmHg.  A midline incision on the knee was made excising the old scar and soft tissue planes created.  Median arthrotomy was made encountering clear synovial fluid.  I did send this off for culture for completeness sake.  Following initial exposure, which included synovectomy in the medial, lateral and suprapatellar regions I exposed the proximal tibia.  I did use a small thin oscillating saw and undermined the anterior aspect of the tibia and using a quarter-inch osteotome made several attempts to try and remove the prosthesis with significant impaction with a mallet. The tibial component did not move at all.  I was worried based on the extent of the cement as well  as previously use components that I would cause more problems than good trying to get this out.  I was satisfied clinically that this was stable.  At this point, attention was now directed to the femur.  The femoral component was removed from the distal femur  through routine use of a thin ACL saw and osteotomes.  It did not appear to be significantly or obviously loose, what was able was removed without any bone loss.  At this point, preparation of femur was carried out, and then reamed up to 15 mm and irrigated the canal.  I then removed a small amount of bone off the distal femur to freshen this cut-out.  Based on the previous size  component, which I felt was appropriately sized, a size 3 component was used.  The anterior, posterior and chamfer cuts were then revisited. I ended up based on rotation and placed a 4 mm augment on the medial side of the  posterior femur to help with rotation.  I did then make the box cut for a TC3 component.  A trial reduction was now carried out with 3 femur, with a 12 mm insert.  With this I felt the knee came to full extension.  It may have just a little bit of hyperextension with a plan to use a 15 insert.  At this point, with the trial components in place I did evert the patella.  Due to the initial exposure I felt it was best we go ahead and revise this as well.  I removed the patellar button without difficulty, drilled lug holes and used a 38 patellar button.  At this point, all trial components were removed.  Cement was mixed.  A cement restrictor was placed in the distal femur.  Once the cement was mixed, the final components were opened.  It continued on the back table, which was a size TC3 component with a 13 x 30 cemented stem with a +2 bolt and a 5 degree adapter.  A +4 posterior medial augment was used.  Once made, the final component was cemented onto the distal femur.  Excess cement was removed.  The knee was brought to extension with a 15 mm trial insert.  The patella was cemented into place.  All extruded cement was removed.  Once the cement had fully cured, excess cement was removed throughout the knee.  The final 15 mm insert to match the 3 femur was inserted.  The tourniquet had been let down after 58 minutes without significant hemostasis required.  A medium Hemovac drain was placed deep.  The knee was re-irrigated with normal saline solution pulse lavage.  At this point, the extensor mechanism was used to reapproximate the knee in flexion using 1 Vicryl.  The remainder of the wound was closed with 2-0 Vicryl, running 4-0 Monocryl, staples on the skin.  The  skin was cleaned, dried and dressed sterilely using Xeroform and a bulky wrap. She was then brought to the recovery room in stable condition tolerating the procedure well.     Madlyn Frankel Charlann Boxer, M.D.    MDO/MEDQ  D:  02/01/2011  T:  02/02/2011  Job:  [id number]

## 2011-02-02 NOTE — Progress Notes (Signed)
Subjective: 1 Day Post-Op Procedure(s): TOTAL KNEE REVISION (RIGHT)  Patient reports pain as mild.  Objective:   VITALS:   Filed Vitals:   02/02/11 0617  BP: 137/76  Pulse: 64  Temp: 97.5 F (36.4 C)  Resp: 17    Neurovascular intact Dorsiflexion/Plantar flexion intact Incision: dressing C/D/I No cellulitis present Compartment soft  LABS  Basename 02/02/11 0335  HGB 10.8*  HCT 32.2*  WBC 11.2*  PLT 248     Basename 02/02/11 0335  NA 128*  K 3.7  BUN 12  CREATININE 0.77  GLUCOSE 153*    Assessment/Plan: 1 Day Post-Op Procedure(s): TOTAL KNEE REVISION (RIGHT)   Advance diet Up with therapy D/C IV fluids Discharge home with home health when discharged   Anastasio Auerbach. Janielle Mittelstadt   PAC  02/02/2011, 8:12 AM

## 2011-02-03 LAB — BASIC METABOLIC PANEL
BUN: 9 mg/dL (ref 6–23)
CO2: 29 mEq/L (ref 19–32)
Calcium: 8.9 mg/dL (ref 8.4–10.5)
GFR calc non Af Amer: 75 mL/min — ABNORMAL LOW (ref >90)
Glucose, Bld: 152 mg/dL — ABNORMAL HIGH (ref 70–99)

## 2011-02-03 LAB — CBC
HCT: 28.8 % — ABNORMAL LOW (ref 36.0–46.0)
Hemoglobin: 9.6 g/dL — ABNORMAL LOW (ref 12.0–15.0)
MCH: 29.9 pg (ref 26.0–34.0)
MCHC: 33.3 g/dL (ref 30.0–36.0)
MCV: 89.7 fL (ref 78.0–100.0)

## 2011-02-03 MED ORDER — DIPHENHYDRAMINE HCL 25 MG PO CAPS: 25.0000 mg | ORAL_CAPSULE | Freq: Four times a day (QID) | ORAL | Status: AC | PRN

## 2011-02-03 MED ORDER — RIVAROXABAN 10 MG PO TABS: 10.0000 mg | ORAL_TABLET | ORAL | Status: DC

## 2011-02-03 MED ORDER — OXYCODONE HCL 5 MG PO TABS: 5.0000 mg | ORAL_TABLET | ORAL | Status: AC | PRN

## 2011-02-03 MED ORDER — FERROUS SULFATE 325 (65 FE) MG PO TABS: 325.0000 mg | ORAL_TABLET | Freq: Three times a day (TID) | ORAL | Status: DC

## 2011-02-03 MED ORDER — METHOCARBAMOL 500 MG PO TABS: 500.0000 mg | ORAL_TABLET | Freq: Four times a day (QID) | ORAL | Status: AC | PRN

## 2011-02-03 MED ORDER — POLYETHYLENE GLYCOL 3350 17 G PO PACK: 17.0000 g | PACK | Freq: Two times a day (BID) | ORAL | Status: AC

## 2011-02-03 MED ORDER — ACETAMINOPHEN 325 MG PO TABS: 650.0000 mg | ORAL_TABLET | Freq: Four times a day (QID) | ORAL | Status: AC | PRN

## 2011-02-03 MED ORDER — ASPIRIN EC 325 MG PO TBEC: 325.0000 mg | DELAYED_RELEASE_TABLET | Freq: Two times a day (BID) | ORAL | Status: AC

## 2011-02-03 NOTE — Progress Notes (Signed)
ot note  Screened for OT.  Pt has all DME and husband assists at home.

## 2011-02-03 NOTE — Progress Notes (Signed)
CM consult done. See CM notes in shadow chart.  Secily Walthour Wyche RN BSN CCM [phone] 02/03/2011    

## 2011-02-03 NOTE — Progress Notes (Signed)
Subjective: 2 Days Post-Op Procedure(s): TOTAL KNEE REVISION (RIGHT)  Patient reports pain as mild.  Objective:   VITALS:   Filed Vitals:   02/03/11 0429  BP: 168/75  Pulse: 72  Temp: 99.1 F (37.3 C)  Resp: 16    Neurovascular intact Dorsiflexion/Plantar flexion intact Incision: dressing C/D/I No cellulitis present Compartment soft  LABS  Basename 02/03/11 0351 02/02/11 0335  HGB 9.6* 10.8*  HCT 28.8* 32.2*  WBC 7.5 11.2*  PLT 219 248     Basename 02/03/11 0351 02/02/11 0335  NA 134* 128*  K 3.6 3.7  BUN 9 12  CREATININE 0.80 0.77  GLUCOSE 152* 153*   Assessment/Plan: 2 Days Post-Op Procedure(s): TOTAL KNEE REVISION (RIGHT)   Up with therapy D/C IV fluids Discharge home with home health   Anastasio Auerbach. Gleason Ardoin   PAC  02/03/2011, 11:59 AM

## 2011-02-03 NOTE — Discharge Summary (Signed)
Physician Discharge Summary  Patient ID: Destiny Hughes MRN: [id number] DOB/AGE: Jul 24, 1945 65 y.o.  Admit date: 02/01/2011 Discharge date: 02/03/2011  Procedures:  Procedure(s):RIGHT TOTAL KNEE REVISION  Attending Physician: Shelda Pal   Admission Diagnoses:  Right knee pain status post previous right TKA  Discharge Diagnoses:  S/P Right total knee revision Palpitation  Tachycardia   Dyspnea   Hyperlipemia   Glaucoma   Chronic neck pain   Chronic back pain   Dysrhythmia   Heart murmur  Hypothyroidism  GERD  Neuromuscular disorder Pain/nerve stimulator implanted -2 yrs ago.  Arthritis  Depression  COPD   PCP: Nadean Corwin, MD, MD   Discharged Condition: good  Hospital Course:  Patient underwent the above stated procedure on 02/01/2011. Patient tolerated the procedure well and brought to the recovery room in good condition and subsequently to the floor.  POD #1 BP: 137/76 ; Pulse: 64 ; Temp: 97.5 F [name] foley was removed, as well as the hemovac drain removed. IV was changed to a saline lock. Neurovascular intact, dorsiflexion/Plantar flexion intact, incision: dressing C/D/I, no cellulitis present and compartment soft. Up with PT/OT  LABS  Basename  02/02/11    HGB  10.8*   HCT  32.2*     POD #2  BP: 168/75 ; Pulse: 72 ; Temp: 99.1 Neurovascular intact, dorsiflexion/Plantar flexion intact, incision: dressing C/D/I, no cellulitis present and compartment soft. Did well with  PT/OT, up and down stairs with no problems and ROM was almost full extension and to about 75 degrees of flexion.  LABS  Basename  02/03/11    HGB  9.6*    HCT  28.8*      Discharge Exam: Extremities: Homans sign is negative, no sign of DVT, no edema, redness or tenderness in the calves or thighs and no ulcers, gangrene or trophic changes  Disposition: Home with HHPT Pt is to be discharged home with HHPT. WBAT. Daily dressing changes with gauze and tape. Keep the area  dry and clean until follow up. Follow up in 2 weeks at [facility]. Call with any questions or concerns.   Disposition:   Discharge Orders    Future Appointments: Provider: Department: Dept Phone: Center:   06/01/2011 11:15 AM Cassell Clement, MD [facility] [phone] None     Future Orders Please Complete By Expires   Diet - low sodium heart healthy      Call MD / Call 911      Comments:   If you experience chest pain or shortness of breath, CALL 911 and be transported to the hospital emergency room.  If you develope a fever above 101 F, pus (white drainage) or increased drainage or redness at the wound, or calf pain, call your surgeon's office.   Constipation Prevention      Comments:   Drink plenty of fluids.  Prune juice may be helpful.  You may use a stool softener, such as Colace (over the counter) 100 mg twice a day.  Use MiraLax (over the counter) for constipation as needed.   Increase activity slowly as tolerated      Weight Bearing as taught in Physical Therapy      Comments:   Use a walker or crutches as instructed.   Discharge instructions      Comments:   Weight bearing as tolerated.  Daily dressing changes with gauze and tape. Keep the area dry and clean until follow up. Follow up in 2 weeks at [facility]. Call with  any questions or concerns.     TED hose      Comments:   Use stockings (TED hose) for 2 weeks on both leg(s).  You may remove them at night for sleeping.   Change dressing      Comments:   Daily dressing changes with gauze and tape. Keep the area dry and clean until follow up in 2 weeks.    Do not put a pillow under the knee. Place it under the heel.        Current Discharge Medication List    START taking these medications   Details  acetaminophen (TYLENOL) 325 MG tablet Take 2 tablets (650 mg total) by mouth every 6 (six) hours as needed (or Fever >/= 101).    aspirin EC 325 MG tablet Take 1 tablet (325 mg total)  by mouth 2 (two) times daily. Qty: 60 tablet, Refills: 0    diphenhydrAMINE (BENADRYL) 25 mg capsule Take 1 capsule (25 mg total) by mouth every 6 (six) hours as needed for itching, allergies or sleep.    ferrous sulfate 325 (65 FE) MG tablet Take 1 tablet (325 mg total) by mouth 3 (three) times daily after meals.    methocarbamol (ROBAXIN) 500 MG tablet Take 1 tablet (500 mg total) by mouth every 6 (six) hours as needed.    oxyCODONE (OXY IR/ROXICODONE) 5 MG immediate release tablet Take 1-3 tablets (5-15 mg total) by mouth every 4 (four) hours as needed for pain. Qty: 100 tablet, Refills: 0    polyethylene glycol (MIRALAX / GLYCOLAX) packet Take 17 g by mouth 2 (two) times daily. Qty: 14 each    rivaroxaban (XARELTO) 10 MG TABS tablet Take 1 tablet (10 mg total) by mouth daily. Qty: 12 tablet      CONTINUE these medications which have NOT CHANGED   Details  Biotin (BIOTIN 5000) 5 MG CAPS Take 1 capsule by mouth daily.      Cholecalciferol (VITAMIN D PO) Take by mouth.      clidinium-chlordiazePOXIDE (LIBRAX) 2.5-5 MG per capsule Take 1 capsule by mouth 2 (two) times daily.     diazepam (VALIUM) 5 MG tablet Take 5 mg by mouth every 6 (six) hours as needed. MUSCLE SPASM     digoxin (LANOXIN) 0.25 MG tablet Take 250 mcg by mouth every morning.     docusate sodium (COLACE) 100 MG capsule Take 100 mg by mouth 3 (three) times daily.     guaiFENesin (MUCINEX) 600 MG 12 hr tablet Take 1,200 mg by mouth 2 (two) times daily.      levothyroxine (SYNTHROID, LEVOTHROID) 88 MCG tablet Take 88 mcg by mouth daily at 12 noon. PT TAKE BEFORE LUNCH    methadone (DOLOPHINE) 5 MG tablet Take 5 mg by mouth every 6 (six) hours.     Multiple Vitamins-Minerals (MULTI-VITAMIN GUMMIES PO) Take 2 tablets by mouth daily.      promethazine (PHENERGAN) 25 MG tablet Take 25 mg by mouth every 6 (six) hours as needed.      propranolol (INDERAL) 80 MG tablet Take 80 mg by mouth 2 (two) times daily.       sertraline (ZOLOFT) 100 MG tablet Take 150 mg by mouth every morning.     theophylline (THEO-24) 200 MG 24 hr capsule Take 200 mg by mouth 2 (two) times daily as needed. WHEEZING       STOP taking these medications     oxycodone (OXY-IR) 5 MG capsule  Signed: Anastasio Auerbach. Treysean Petruzzi   PAC  02/03/2011, 12:11 PM

## 2011-02-03 NOTE — Progress Notes (Signed)
Physical Therapy Treatment Patient Details Name: Destiny Hughes MRN: [id number] DOB: 1945/08/26 Today's Date: 02/03/2011 [id number] GE PT Assessment/Plan  PT - Assessment/Plan Comments on Treatment Session: ready to d/c to home with husband and HHPT PT Plan: Discharge plan remains appropriate;Frequency remains appropriate PT Goals  Acute Rehab PT Goals PT Goal: Supine/Side to Sit - Progress: Met PT Transfer Goal: Sit to Stand/Stand to Sit - Progress: Met PT Goal: Ambulate - Progress: Met PT Goal: Up/Down Stairs - Progress: Met PT Goal: Perform Home Exercise Program - Progress: Partly met Additional Goals Additional Goal #1:  (witten home program issued)  PT Treatment Precautions/Restrictions  Precautions Precautions: Knee Required Braces or Orthoses: Yes Knee Immobilizer: On except when in CPM Restrictions Weight Bearing Restrictions: Yes RLE Weight Bearing: Weight bearing as tolerated Mobility (including Balance) Bed Mobility Bed Mobility: Yes Supine to Sit: 4: Min assist Transfers Transfers: Yes Sit to Stand: 5: Supervision Stand to Sit: 5: Supervision Ambulation/Gait Ambulation/Gait: Yes Ambulation/Gait Assistance: 5: Supervision Ambulation Distance (Feet): 100 Feet Assistive device: Rolling walker Gait Pattern: Step-through pattern (knee immoblizer in place) Stairs: Yes Stairs Assistance: 5: Supervision Stairs Assistance Details (indicate cue type and reason): at left side since rail is on the right side Stair Management Technique: One rail Right;Forwards Number of Stairs: 5     Exercise  Total Joint Exercises Ankle Circles/Pumps: AAROM;Strengthening;Right;5 reps;Seated Quad Sets: AROM;10 reps Gluteal Sets: Standing;AROM;5 reps Long Arc Quad: AAROM;Seated;10 reps;Strengthening;Right Knee Flexion: AROM;Strengthening;Right;10 reps;Seated End of Session PT - End of Session Equipment Utilized During Treatment: Gait belt;Other (comment) (RW) Activity  Tolerance: Patient tolerated treatment well Patient left: in chair General Behavior During Session: [facility] for tasks performed  [name] 02/03/2011, 1:15 PM

## 2011-02-03 NOTE — Progress Notes (Signed)
Discharge instructions and prescription given.  Home health arranged with [facility].  Left via wheelchair with husband

## 2011-02-04 ENCOUNTER — Encounter (HOSPITAL_COMMUNITY): Payer: Self-pay | Admitting: Orthopedic Surgery

## 2011-02-05 LAB — BODY FLUID CULTURE

## 2011-02-06 LAB — ANAEROBIC CULTURE

## 2011-03-28 DIAGNOSIS — M47817 Spondylosis without myelopathy or radiculopathy, lumbosacral region: Secondary | ICD-10-CM | POA: Diagnosis not present

## 2011-03-28 DIAGNOSIS — IMO0001 Reserved for inherently not codable concepts without codable children: Secondary | ICD-10-CM | POA: Diagnosis not present

## 2011-03-28 DIAGNOSIS — M47812 Spondylosis without myelopathy or radiculopathy, cervical region: Secondary | ICD-10-CM | POA: Diagnosis not present

## 2011-04-01 DIAGNOSIS — M25569 Pain in unspecified knee: Secondary | ICD-10-CM | POA: Diagnosis not present

## 2011-04-01 DIAGNOSIS — M1710 Unilateral primary osteoarthritis, unspecified knee: Secondary | ICD-10-CM | POA: Diagnosis not present

## 2011-04-04 DIAGNOSIS — Z79899 Other long term (current) drug therapy: Secondary | ICD-10-CM | POA: Diagnosis not present

## 2011-04-04 DIAGNOSIS — I10 Essential (primary) hypertension: Secondary | ICD-10-CM | POA: Diagnosis not present

## 2011-04-04 DIAGNOSIS — R7309 Other abnormal glucose: Secondary | ICD-10-CM | POA: Diagnosis not present

## 2011-04-04 DIAGNOSIS — E559 Vitamin D deficiency, unspecified: Secondary | ICD-10-CM | POA: Diagnosis not present

## 2011-04-04 DIAGNOSIS — E782 Mixed hyperlipidemia: Secondary | ICD-10-CM | POA: Diagnosis not present

## 2011-04-05 DIAGNOSIS — IMO0001 Reserved for inherently not codable concepts without codable children: Secondary | ICD-10-CM | POA: Diagnosis not present

## 2011-04-05 DIAGNOSIS — M47817 Spondylosis without myelopathy or radiculopathy, lumbosacral region: Secondary | ICD-10-CM | POA: Diagnosis not present

## 2011-04-05 DIAGNOSIS — M47812 Spondylosis without myelopathy or radiculopathy, cervical region: Secondary | ICD-10-CM | POA: Diagnosis not present

## 2011-04-12 DIAGNOSIS — IMO0001 Reserved for inherently not codable concepts without codable children: Secondary | ICD-10-CM | POA: Diagnosis not present

## 2011-04-12 DIAGNOSIS — M47812 Spondylosis without myelopathy or radiculopathy, cervical region: Secondary | ICD-10-CM | POA: Diagnosis not present

## 2011-04-12 DIAGNOSIS — M1710 Unilateral primary osteoarthritis, unspecified knee: Secondary | ICD-10-CM | POA: Diagnosis not present

## 2011-04-15 ENCOUNTER — Other Ambulatory Visit: Payer: Self-pay | Admitting: Gastroenterology

## 2011-04-15 ENCOUNTER — Ambulatory Visit
Admission: RE | Admit: 2011-04-15 | Discharge: 2011-04-15 | Disposition: A | Discharge status: Home / Self Care | Payer: Medicare Other | Source: Ambulatory Visit | Attending: Gastroenterology | Admitting: Gastroenterology

## 2011-04-15 DIAGNOSIS — R1310 Dysphagia, unspecified: Secondary | ICD-10-CM | POA: Diagnosis not present

## 2011-04-25 ENCOUNTER — Other Ambulatory Visit: Payer: Self-pay | Admitting: Gastroenterology

## 2011-04-25 DIAGNOSIS — K319 Disease of stomach and duodenum, unspecified: Secondary | ICD-10-CM | POA: Diagnosis not present

## 2011-04-25 DIAGNOSIS — D131 Benign neoplasm of stomach: Secondary | ICD-10-CM | POA: Diagnosis not present

## 2011-04-25 DIAGNOSIS — R1310 Dysphagia, unspecified: Secondary | ICD-10-CM | POA: Diagnosis not present

## 2011-05-03 DIAGNOSIS — IMO0001 Reserved for inherently not codable concepts without codable children: Secondary | ICD-10-CM | POA: Diagnosis not present

## 2011-05-03 DIAGNOSIS — M47817 Spondylosis without myelopathy or radiculopathy, lumbosacral region: Secondary | ICD-10-CM | POA: Diagnosis not present

## 2011-05-03 DIAGNOSIS — G894 Chronic pain syndrome: Secondary | ICD-10-CM | POA: Diagnosis not present

## 2011-05-03 DIAGNOSIS — M47812 Spondylosis without myelopathy or radiculopathy, cervical region: Secondary | ICD-10-CM | POA: Diagnosis not present

## 2011-05-24 DIAGNOSIS — M961 Postlaminectomy syndrome, not elsewhere classified: Secondary | ICD-10-CM | POA: Diagnosis not present

## 2011-05-24 DIAGNOSIS — M5412 Radiculopathy, cervical region: Secondary | ICD-10-CM | POA: Diagnosis not present

## 2011-05-24 DIAGNOSIS — M25569 Pain in unspecified knee: Secondary | ICD-10-CM | POA: Diagnosis not present

## 2011-05-27 DIAGNOSIS — M25569 Pain in unspecified knee: Secondary | ICD-10-CM | POA: Diagnosis not present

## 2011-05-31 ENCOUNTER — Other Ambulatory Visit (HOSPITAL_COMMUNITY): Payer: Self-pay | Admitting: Anesthesiology

## 2011-05-31 DIAGNOSIS — M25561 Pain in right knee: Secondary | ICD-10-CM

## 2011-05-31 DIAGNOSIS — M25562 Pain in left knee: Secondary | ICD-10-CM

## 2011-05-31 DIAGNOSIS — M25551 Pain in right hip: Secondary | ICD-10-CM

## 2011-06-01 ENCOUNTER — Ambulatory Visit (INDEPENDENT_AMBULATORY_CARE_PROVIDER_SITE_OTHER): Payer: Medicare Other | Admitting: Cardiology

## 2011-06-01 ENCOUNTER — Encounter: Payer: Self-pay | Admitting: Cardiology

## 2011-06-01 VITALS — BP 124/70 | HR 60 | Ht 66.0 in | Wt 158.0 lb

## 2011-06-01 DIAGNOSIS — G8929 Other chronic pain: Secondary | ICD-10-CM

## 2011-06-01 DIAGNOSIS — R002 Palpitations: Secondary | ICD-10-CM

## 2011-06-01 DIAGNOSIS — R0609 Other forms of dyspnea: Secondary | ICD-10-CM

## 2011-06-01 DIAGNOSIS — M549 Dorsalgia, unspecified: Secondary | ICD-10-CM | POA: Diagnosis not present

## 2011-06-01 DIAGNOSIS — R079 Chest pain, unspecified: Secondary | ICD-10-CM

## 2011-06-01 MED ORDER — NITROGLYCERIN 0.4 MG SL SUBL: 0.4000 mg | SUBLINGUAL_TABLET | SUBLINGUAL | Status: AC | PRN

## 2011-06-01 NOTE — Patient Instructions (Signed)
Your physician recommends that you continue on your current medications as directed. Please refer to the Current Medication list given to you today. Your physician wants you to follow-up in: 4 months You will receive a reminder letter in the mail two months in advance. If you don't receive a letter, please call our office to schedule the follow-up appointment.  

## 2011-06-01 NOTE — Assessment & Plan Note (Signed)
The patient notes occasional palpitations.  Her electrocardiogram today confirms sinus bradycardia with first degree block

## 2011-06-01 NOTE — Assessment & Plan Note (Signed)
The patient has a long history of exertional dyspnea.  He has a long history of smoking cigarettes.  She has smoked a half pack a day for 50 years.  She's not having symptoms of paroxysmal nocturnal dyspnea or congestive heart failure.

## 2011-06-01 NOTE — Assessment & Plan Note (Signed)
The patient has a history of chronic back pain and she has an implanted nerve stimulator with leads going to her thoracic spine from a battery pack generator which is subcutaneous over the left hip.  The nerve stimulator has been very helpful in alleviating her chronic pain

## 2011-06-01 NOTE — Progress Notes (Signed)
Destiny Hughes Date of Birth:  07/13/1945 [facility] [address] [place], [place]  [address] [phone]         Fax   [phone]  History of Present Illness: This pleasant 66 year old woman is seen for a four-month followup office visit.  He has a history of a chronically abnormal EKG.  She does not have any history of known ischemic heart disease.  She had a normal Cardiolite stress test in 2007 and she had a rather normal Cardiolite stress test in May of 2011.  She had an echocardiogram in May 2011 which showed mild aortic sclerosis and normal systolic function with diastolic dysfunction and normal pulmonary artery pressure.  She has occasional chest tightness relieved by nitroglycerin.  She had an episode about 2 weeks ago and took 2 nitroglycerin with relief.  Her nitroglycerin are several years old and we are calling her a new prescription today.  Since last visit she's had another knee operation which makes her fifth operation altogether on the right knee  Current Outpatient Prescriptions  Medication Sig Dispense Refill  . Biotin (BIOTIN 5000) 5 MG CAPS Take 1 capsule by mouth daily.        . Cholecalciferol (VITAMIN D PO) Take by mouth. Taking 1000 daily      . clidinium-chlordiazePOXIDE (LIBRAX) 2.5-5 MG per capsule Take 1 capsule by mouth 2 (two) times daily.       . diazepam (VALIUM) 5 MG tablet Take 5 mg by mouth every 6 (six) hours as needed. MUSCLE SPASM       . digoxin (LANOXIN) 0.25 MG tablet Take 250 mcg by mouth every morning.       . docusate sodium (COLACE) 100 MG capsule Take 100 mg by mouth 3 (three) times daily.       Marland Kitchen guaiFENesin (MUCINEX) 600 MG 12 hr tablet Take 1,200 mg by mouth 2 (two) times daily.        Marland Kitchen levothyroxine (SYNTHROID, LEVOTHROID) 88 MCG tablet Take 88 mcg by mouth daily at 12 noon. Friday,saturday and Sunday she takes 11/2      . methadone (DOLOPHINE) 5 MG tablet Take 5 mg by mouth every 6 (six) hours.       .  Multiple Vitamins-Minerals (MULTI-VITAMIN GUMMIES PO) Take 2 tablets by mouth daily.        . OXYCODONE HCL PO Take 5 mg by mouth. As needed      . promethazine (PHENERGAN) 25 MG tablet Take 25 mg by mouth every 6 (six) hours as needed.        . propranolol (INDERAL) 80 MG tablet Take 80 mg by mouth 2 (two) times daily.       . sertraline (ZOLOFT) 100 MG tablet Take 150 mg by mouth every morning.       . theophylline (THEO-24) 200 MG 24 hr capsule Take 200 mg by mouth 2 (two) times daily as needed. WHEEZING       . nitroGLYCERIN (NITROSTAT) 0.4 MG SL tablet Place 1 tablet (0.4 mg total) under the tongue every 5 (five) minutes as needed for chest pain.  25 tablet  11    Allergies  Allergen Reactions  . Ventolin (VHQ:IONGEXBMW) Anaphylaxis  . Albuterol   . Codeine Nausea And Vomiting  . Penicillins Swelling  . Ecotrin (Aspirin) Nausea And Vomiting    Patient Active Problem List  Diagnoses  . Palpitation  . Tachycardia  . Dyspnea  . Hyperlipemia  . Glaucoma  .  Chronic neck pain  . Chronic back pain  . Dyspnea on effort  . Osteoarthritis    History  Smoking status  . Current Everyday Smoker -- 0.5 packs/day for 50 years  . Types: Cigarettes  Smokeless tobacco  . Not on file    History  Alcohol Use No    Family History  Problem Relation Age of Onset  . Heart attack Father   . Aneurysm Mother     CEREBRAL    Review of Systems: Constitutional: no fever chills diaphoresis or fatigue or change in weight.  Head and neck: no hearing loss, no epistaxis, no photophobia or visual disturbance. Respiratory: No cough, shortness of breath or wheezing. Cardiovascular: No chest pain peripheral edema, palpitations. Gastrointestinal: No abdominal distention, no abdominal pain, no change in bowel habits hematochezia or melena. Genitourinary: No dysuria, no frequency, no urgency, no nocturia. Musculoskeletal:No arthralgias, no back pain, no gait disturbance or  myalgias. Neurological: No dizziness, no headaches, no numbness, no seizures, no syncope, no weakness, no tremors. Hematologic: No lymphadenopathy, no easy bruising. Psychiatric: No confusion, no hallucinations, no sleep disturbance.    Physical Exam: Filed Vitals:   06/01/11 1114  BP: 124/70  Pulse: 60   the general appearance reveals a well-developed well-nourished woman in no distress.Pupils equal and reactive.   Extraocular Movements are full.  There is no scleral icterus.  The mouth and pharynx are normal.  The neck is supple.  The carotids reveal no bruits.  The jugular venous pressure is normal.  The thyroid is not enlarged.  There is no lymphadenopathy.  The chest is clear to percussion and auscultation. There are no rales or rhonchi. Expansion of the chest is symmetrical.  The precordium is quiet.  The first heart sound is normal.  The second heart sound is physiologically split.  There is no murmur gallop rub or click.  There is no abnormal lift or heave.  The abdomen is soft and nontender. Bowel sounds are normal. The liver and spleen are not enlarged. There Are no abdominal masses. There are no bruits.  The battery pack for the thoracic nerve stimulator is palpable in the left flank just above the hip.The pedal pulses are good.  There is no phlebitis or edema.  There is no cyanosis or clubbing. Strength is normal and symmetrical in all extremities.  There is no lateralizing weakness.  There are no sensory deficits.  The skin is warm and dry.  There is no rash.  EKG today shows bradycardia with vertical axis and nonspecific ST-T wave changes unchanged from prior EKGs   Assessment / Plan: Continue same medication.  [place]in.  She'll return in 4 months for followup office visit.

## 2011-06-03 ENCOUNTER — Encounter (HOSPITAL_COMMUNITY)
Admission: RE | Admit: 2011-06-03 | Discharge: 2011-06-03 | Disposition: A | Discharge status: Home / Self Care | Payer: Medicare Other | Source: Ambulatory Visit | Attending: Anesthesiology | Admitting: Anesthesiology

## 2011-06-03 ENCOUNTER — Ambulatory Visit (HOSPITAL_COMMUNITY): Admission: RE | Admit: 2011-06-03 | Payer: Medicare Other | Source: Ambulatory Visit

## 2011-06-03 DIAGNOSIS — M25551 Pain in right hip: Secondary | ICD-10-CM

## 2011-06-03 DIAGNOSIS — M25561 Pain in right knee: Secondary | ICD-10-CM

## 2011-06-03 DIAGNOSIS — M25559 Pain in unspecified hip: Secondary | ICD-10-CM | POA: Diagnosis not present

## 2011-06-03 DIAGNOSIS — M25569 Pain in unspecified knee: Secondary | ICD-10-CM | POA: Diagnosis not present

## 2011-06-03 MED ORDER — TECHNETIUM TC 99M MEDRONATE IV KIT
25.0000 | PACK | Freq: Once | INTRAVENOUS | Status: AC | PRN
  Administered 2011-06-03: 25 via INTRAVENOUS

## 2011-06-06 DIAGNOSIS — M25569 Pain in unspecified knee: Secondary | ICD-10-CM | POA: Diagnosis not present

## 2011-06-06 DIAGNOSIS — IMO0001 Reserved for inherently not codable concepts without codable children: Secondary | ICD-10-CM | POA: Diagnosis not present

## 2011-06-06 DIAGNOSIS — M47812 Spondylosis without myelopathy or radiculopathy, cervical region: Secondary | ICD-10-CM | POA: Diagnosis not present

## 2011-06-06 DIAGNOSIS — M47817 Spondylosis without myelopathy or radiculopathy, lumbosacral region: Secondary | ICD-10-CM | POA: Diagnosis not present

## 2011-06-09 DIAGNOSIS — M25569 Pain in unspecified knee: Secondary | ICD-10-CM | POA: Diagnosis not present

## 2011-06-13 DIAGNOSIS — M47812 Spondylosis without myelopathy or radiculopathy, cervical region: Secondary | ICD-10-CM | POA: Diagnosis not present

## 2011-06-13 DIAGNOSIS — G894 Chronic pain syndrome: Secondary | ICD-10-CM | POA: Diagnosis not present

## 2011-06-13 DIAGNOSIS — R51 Headache: Secondary | ICD-10-CM | POA: Diagnosis not present

## 2011-06-13 DIAGNOSIS — M1710 Unilateral primary osteoarthritis, unspecified knee: Secondary | ICD-10-CM | POA: Diagnosis not present

## 2011-06-14 DIAGNOSIS — Z79899 Other long term (current) drug therapy: Secondary | ICD-10-CM | POA: Diagnosis not present

## 2011-06-14 DIAGNOSIS — I2510 Atherosclerotic heart disease of native coronary artery without angina pectoris: Secondary | ICD-10-CM | POA: Diagnosis not present

## 2011-06-27 DIAGNOSIS — M25569 Pain in unspecified knee: Secondary | ICD-10-CM | POA: Diagnosis not present

## 2011-06-27 DIAGNOSIS — M531 Cervicobrachial syndrome: Secondary | ICD-10-CM | POA: Diagnosis not present

## 2011-06-27 DIAGNOSIS — M961 Postlaminectomy syndrome, not elsewhere classified: Secondary | ICD-10-CM | POA: Diagnosis not present

## 2011-07-14 DIAGNOSIS — E559 Vitamin D deficiency, unspecified: Secondary | ICD-10-CM | POA: Diagnosis not present

## 2011-07-14 DIAGNOSIS — I10 Essential (primary) hypertension: Secondary | ICD-10-CM | POA: Diagnosis not present

## 2011-07-14 DIAGNOSIS — E039 Hypothyroidism, unspecified: Secondary | ICD-10-CM | POA: Diagnosis not present

## 2011-07-14 DIAGNOSIS — Z79899 Other long term (current) drug therapy: Secondary | ICD-10-CM | POA: Diagnosis not present

## 2011-07-14 DIAGNOSIS — J0100 Acute maxillary sinusitis, unspecified: Secondary | ICD-10-CM | POA: Diagnosis not present

## 2011-07-21 DIAGNOSIS — M5412 Radiculopathy, cervical region: Secondary | ICD-10-CM | POA: Diagnosis not present

## 2011-07-21 DIAGNOSIS — M47812 Spondylosis without myelopathy or radiculopathy, cervical region: Secondary | ICD-10-CM | POA: Diagnosis not present

## 2011-07-21 DIAGNOSIS — M961 Postlaminectomy syndrome, not elsewhere classified: Secondary | ICD-10-CM | POA: Diagnosis not present

## 2011-08-04 DIAGNOSIS — M25569 Pain in unspecified knee: Secondary | ICD-10-CM | POA: Diagnosis not present

## 2011-08-04 DIAGNOSIS — M25559 Pain in unspecified hip: Secondary | ICD-10-CM | POA: Diagnosis not present

## 2011-08-18 DIAGNOSIS — M961 Postlaminectomy syndrome, not elsewhere classified: Secondary | ICD-10-CM | POA: Diagnosis not present

## 2011-08-18 DIAGNOSIS — M47817 Spondylosis without myelopathy or radiculopathy, lumbosacral region: Secondary | ICD-10-CM | POA: Diagnosis not present

## 2011-08-18 DIAGNOSIS — M47812 Spondylosis without myelopathy or radiculopathy, cervical region: Secondary | ICD-10-CM | POA: Diagnosis not present

## 2011-09-01 DIAGNOSIS — M531 Cervicobrachial syndrome: Secondary | ICD-10-CM | POA: Diagnosis not present

## 2011-09-01 DIAGNOSIS — M961 Postlaminectomy syndrome, not elsewhere classified: Secondary | ICD-10-CM | POA: Diagnosis not present

## 2011-09-01 DIAGNOSIS — R51 Headache: Secondary | ICD-10-CM | POA: Diagnosis not present

## 2011-09-08 DIAGNOSIS — M47812 Spondylosis without myelopathy or radiculopathy, cervical region: Secondary | ICD-10-CM | POA: Diagnosis not present

## 2011-09-08 DIAGNOSIS — IMO0001 Reserved for inherently not codable concepts without codable children: Secondary | ICD-10-CM | POA: Diagnosis not present

## 2011-09-12 DIAGNOSIS — H2510 Age-related nuclear cataract, unspecified eye: Secondary | ICD-10-CM | POA: Diagnosis not present

## 2011-09-12 DIAGNOSIS — H4010X0 Unspecified open-angle glaucoma, stage unspecified: Secondary | ICD-10-CM | POA: Diagnosis not present

## 2011-09-13 DIAGNOSIS — M531 Cervicobrachial syndrome: Secondary | ICD-10-CM | POA: Diagnosis not present

## 2011-09-20 DIAGNOSIS — M47812 Spondylosis without myelopathy or radiculopathy, cervical region: Secondary | ICD-10-CM | POA: Diagnosis not present

## 2011-09-20 DIAGNOSIS — M531 Cervicobrachial syndrome: Secondary | ICD-10-CM | POA: Diagnosis not present

## 2011-10-06 DIAGNOSIS — M47812 Spondylosis without myelopathy or radiculopathy, cervical region: Secondary | ICD-10-CM | POA: Diagnosis not present

## 2011-10-06 DIAGNOSIS — M25569 Pain in unspecified knee: Secondary | ICD-10-CM | POA: Diagnosis not present

## 2011-10-06 DIAGNOSIS — IMO0001 Reserved for inherently not codable concepts without codable children: Secondary | ICD-10-CM | POA: Diagnosis not present

## 2011-10-06 DIAGNOSIS — M961 Postlaminectomy syndrome, not elsewhere classified: Secondary | ICD-10-CM | POA: Diagnosis not present

## 2011-10-18 ENCOUNTER — Encounter: Payer: Self-pay | Admitting: Cardiology

## 2011-10-18 DIAGNOSIS — Z23 Encounter for immunization: Secondary | ICD-10-CM | POA: Diagnosis not present

## 2011-10-18 DIAGNOSIS — N3000 Acute cystitis without hematuria: Secondary | ICD-10-CM | POA: Diagnosis not present

## 2011-10-18 DIAGNOSIS — Z1212 Encounter for screening for malignant neoplasm of rectum: Secondary | ICD-10-CM | POA: Diagnosis not present

## 2011-10-18 DIAGNOSIS — Z79899 Other long term (current) drug therapy: Secondary | ICD-10-CM | POA: Diagnosis not present

## 2011-10-18 DIAGNOSIS — E559 Vitamin D deficiency, unspecified: Secondary | ICD-10-CM | POA: Diagnosis not present

## 2011-10-18 DIAGNOSIS — Z111 Encounter for screening for respiratory tuberculosis: Secondary | ICD-10-CM | POA: Diagnosis not present

## 2011-10-18 DIAGNOSIS — I10 Essential (primary) hypertension: Secondary | ICD-10-CM | POA: Diagnosis not present

## 2011-10-18 DIAGNOSIS — E782 Mixed hyperlipidemia: Secondary | ICD-10-CM | POA: Diagnosis not present

## 2011-10-18 DIAGNOSIS — R7309 Other abnormal glucose: Secondary | ICD-10-CM | POA: Diagnosis not present

## 2011-10-20 DIAGNOSIS — M961 Postlaminectomy syndrome, not elsewhere classified: Secondary | ICD-10-CM | POA: Diagnosis not present

## 2011-10-20 DIAGNOSIS — G894 Chronic pain syndrome: Secondary | ICD-10-CM | POA: Diagnosis not present

## 2011-10-20 DIAGNOSIS — M531 Cervicobrachial syndrome: Secondary | ICD-10-CM | POA: Diagnosis not present

## 2011-10-28 DIAGNOSIS — M961 Postlaminectomy syndrome, not elsewhere classified: Secondary | ICD-10-CM | POA: Diagnosis not present

## 2011-10-28 DIAGNOSIS — M47812 Spondylosis without myelopathy or radiculopathy, cervical region: Secondary | ICD-10-CM | POA: Diagnosis not present

## 2011-10-28 DIAGNOSIS — M531 Cervicobrachial syndrome: Secondary | ICD-10-CM | POA: Diagnosis not present

## 2011-11-10 DIAGNOSIS — M47812 Spondylosis without myelopathy or radiculopathy, cervical region: Secondary | ICD-10-CM | POA: Diagnosis not present

## 2011-11-10 DIAGNOSIS — G894 Chronic pain syndrome: Secondary | ICD-10-CM | POA: Diagnosis not present

## 2011-11-10 DIAGNOSIS — M531 Cervicobrachial syndrome: Secondary | ICD-10-CM | POA: Diagnosis not present

## 2011-11-22 DIAGNOSIS — N3000 Acute cystitis without hematuria: Secondary | ICD-10-CM | POA: Diagnosis not present

## 2011-11-22 DIAGNOSIS — E559 Vitamin D deficiency, unspecified: Secondary | ICD-10-CM | POA: Diagnosis not present

## 2011-11-22 DIAGNOSIS — E538 Deficiency of other specified B group vitamins: Secondary | ICD-10-CM | POA: Diagnosis not present

## 2011-11-22 DIAGNOSIS — E039 Hypothyroidism, unspecified: Secondary | ICD-10-CM | POA: Diagnosis not present

## 2011-12-01 DIAGNOSIS — IMO0001 Reserved for inherently not codable concepts without codable children: Secondary | ICD-10-CM | POA: Diagnosis not present

## 2011-12-01 DIAGNOSIS — M47817 Spondylosis without myelopathy or radiculopathy, lumbosacral region: Secondary | ICD-10-CM | POA: Diagnosis not present

## 2011-12-09 DIAGNOSIS — M25519 Pain in unspecified shoulder: Secondary | ICD-10-CM | POA: Diagnosis not present

## 2011-12-23 DIAGNOSIS — M961 Postlaminectomy syndrome, not elsewhere classified: Secondary | ICD-10-CM | POA: Diagnosis not present

## 2011-12-23 DIAGNOSIS — M1710 Unilateral primary osteoarthritis, unspecified knee: Secondary | ICD-10-CM | POA: Diagnosis not present

## 2011-12-23 DIAGNOSIS — IMO0001 Reserved for inherently not codable concepts without codable children: Secondary | ICD-10-CM | POA: Diagnosis not present

## 2011-12-27 DIAGNOSIS — J019 Acute sinusitis, unspecified: Secondary | ICD-10-CM | POA: Diagnosis not present

## 2011-12-27 DIAGNOSIS — M2550 Pain in unspecified joint: Secondary | ICD-10-CM | POA: Diagnosis not present

## 2011-12-27 DIAGNOSIS — Z79899 Other long term (current) drug therapy: Secondary | ICD-10-CM | POA: Diagnosis not present

## 2011-12-27 DIAGNOSIS — E039 Hypothyroidism, unspecified: Secondary | ICD-10-CM | POA: Diagnosis not present

## 2011-12-27 DIAGNOSIS — J40 Bronchitis, not specified as acute or chronic: Secondary | ICD-10-CM | POA: Diagnosis not present

## 2012-01-05 DIAGNOSIS — M47812 Spondylosis without myelopathy or radiculopathy, cervical region: Secondary | ICD-10-CM | POA: Diagnosis not present

## 2012-01-05 DIAGNOSIS — M47817 Spondylosis without myelopathy or radiculopathy, lumbosacral region: Secondary | ICD-10-CM | POA: Diagnosis not present

## 2012-01-05 DIAGNOSIS — M531 Cervicobrachial syndrome: Secondary | ICD-10-CM | POA: Diagnosis not present

## 2012-01-05 DIAGNOSIS — G894 Chronic pain syndrome: Secondary | ICD-10-CM | POA: Diagnosis not present

## 2012-01-12 DIAGNOSIS — Z79899 Other long term (current) drug therapy: Secondary | ICD-10-CM | POA: Diagnosis not present

## 2012-01-19 DIAGNOSIS — M961 Postlaminectomy syndrome, not elsewhere classified: Secondary | ICD-10-CM | POA: Diagnosis not present

## 2012-01-19 DIAGNOSIS — M47812 Spondylosis without myelopathy or radiculopathy, cervical region: Secondary | ICD-10-CM | POA: Diagnosis not present

## 2012-01-19 DIAGNOSIS — M531 Cervicobrachial syndrome: Secondary | ICD-10-CM | POA: Diagnosis not present

## 2012-01-19 DIAGNOSIS — IMO0001 Reserved for inherently not codable concepts without codable children: Secondary | ICD-10-CM | POA: Diagnosis not present

## 2012-02-07 DIAGNOSIS — M47812 Spondylosis without myelopathy or radiculopathy, cervical region: Secondary | ICD-10-CM | POA: Diagnosis not present

## 2012-02-07 DIAGNOSIS — I69993 Ataxia following unspecified cerebrovascular disease: Secondary | ICD-10-CM | POA: Diagnosis not present

## 2012-02-07 DIAGNOSIS — IMO0001 Reserved for inherently not codable concepts without codable children: Secondary | ICD-10-CM | POA: Diagnosis not present

## 2012-02-07 DIAGNOSIS — M25569 Pain in unspecified knee: Secondary | ICD-10-CM | POA: Diagnosis not present

## 2012-02-10 DIAGNOSIS — M1710 Unilateral primary osteoarthritis, unspecified knee: Secondary | ICD-10-CM | POA: Diagnosis not present

## 2012-02-21 DIAGNOSIS — IMO0001 Reserved for inherently not codable concepts without codable children: Secondary | ICD-10-CM | POA: Diagnosis not present

## 2012-02-21 DIAGNOSIS — M961 Postlaminectomy syndrome, not elsewhere classified: Secondary | ICD-10-CM | POA: Diagnosis not present

## 2012-02-21 DIAGNOSIS — M47817 Spondylosis without myelopathy or radiculopathy, lumbosacral region: Secondary | ICD-10-CM | POA: Diagnosis not present

## 2012-02-28 DIAGNOSIS — H4010X0 Unspecified open-angle glaucoma, stage unspecified: Secondary | ICD-10-CM | POA: Diagnosis not present

## 2012-02-28 DIAGNOSIS — H2510 Age-related nuclear cataract, unspecified eye: Secondary | ICD-10-CM | POA: Diagnosis not present

## 2012-03-08 ENCOUNTER — Other Ambulatory Visit: Payer: Self-pay | Admitting: Anesthesiology

## 2012-03-08 DIAGNOSIS — R270 Ataxia, unspecified: Secondary | ICD-10-CM

## 2012-03-13 ENCOUNTER — Ambulatory Visit
Admission: RE | Admit: 2012-03-13 | Discharge: 2012-03-13 | Disposition: A | Discharge status: Home / Self Care | Payer: Medicare Other | Source: Ambulatory Visit | Attending: Anesthesiology | Admitting: Anesthesiology

## 2012-03-13 DIAGNOSIS — S0990XA Unspecified injury of head, initial encounter: Secondary | ICD-10-CM | POA: Diagnosis not present

## 2012-03-13 DIAGNOSIS — R270 Ataxia, unspecified: Secondary | ICD-10-CM

## 2012-03-13 DIAGNOSIS — R51 Headache: Secondary | ICD-10-CM | POA: Diagnosis not present

## 2012-03-20 DIAGNOSIS — M47817 Spondylosis without myelopathy or radiculopathy, lumbosacral region: Secondary | ICD-10-CM | POA: Diagnosis not present

## 2012-03-20 DIAGNOSIS — M47812 Spondylosis without myelopathy or radiculopathy, cervical region: Secondary | ICD-10-CM | POA: Diagnosis not present

## 2012-03-20 DIAGNOSIS — M961 Postlaminectomy syndrome, not elsewhere classified: Secondary | ICD-10-CM | POA: Diagnosis not present

## 2012-04-02 DIAGNOSIS — E039 Hypothyroidism, unspecified: Secondary | ICD-10-CM | POA: Diagnosis not present

## 2012-04-02 DIAGNOSIS — Z79899 Other long term (current) drug therapy: Secondary | ICD-10-CM | POA: Diagnosis not present

## 2012-04-02 DIAGNOSIS — E782 Mixed hyperlipidemia: Secondary | ICD-10-CM | POA: Diagnosis not present

## 2012-04-02 DIAGNOSIS — M2550 Pain in unspecified joint: Secondary | ICD-10-CM | POA: Diagnosis not present

## 2012-04-02 DIAGNOSIS — R7309 Other abnormal glucose: Secondary | ICD-10-CM | POA: Diagnosis not present

## 2012-04-02 DIAGNOSIS — I10 Essential (primary) hypertension: Secondary | ICD-10-CM | POA: Diagnosis not present

## 2012-04-02 DIAGNOSIS — R300 Dysuria: Secondary | ICD-10-CM | POA: Diagnosis not present

## 2012-04-02 DIAGNOSIS — E559 Vitamin D deficiency, unspecified: Secondary | ICD-10-CM | POA: Diagnosis not present

## 2012-04-10 DIAGNOSIS — M47812 Spondylosis without myelopathy or radiculopathy, cervical region: Secondary | ICD-10-CM | POA: Diagnosis not present

## 2012-04-10 DIAGNOSIS — M961 Postlaminectomy syndrome, not elsewhere classified: Secondary | ICD-10-CM | POA: Diagnosis not present

## 2012-04-10 DIAGNOSIS — M47817 Spondylosis without myelopathy or radiculopathy, lumbosacral region: Secondary | ICD-10-CM | POA: Diagnosis not present

## 2012-04-11 DIAGNOSIS — M25569 Pain in unspecified knee: Secondary | ICD-10-CM | POA: Diagnosis not present

## 2012-04-18 DIAGNOSIS — M1710 Unilateral primary osteoarthritis, unspecified knee: Secondary | ICD-10-CM | POA: Diagnosis not present

## 2012-04-25 DIAGNOSIS — M1710 Unilateral primary osteoarthritis, unspecified knee: Secondary | ICD-10-CM | POA: Diagnosis not present

## 2012-05-02 ENCOUNTER — Other Ambulatory Visit (HOSPITAL_COMMUNITY): Payer: Self-pay | Admitting: Anesthesiology

## 2012-05-02 DIAGNOSIS — M25561 Pain in right knee: Secondary | ICD-10-CM

## 2012-05-02 DIAGNOSIS — M1710 Unilateral primary osteoarthritis, unspecified knee: Secondary | ICD-10-CM | POA: Diagnosis not present

## 2012-05-02 DIAGNOSIS — M961 Postlaminectomy syndrome, not elsewhere classified: Secondary | ICD-10-CM | POA: Diagnosis not present

## 2012-05-02 DIAGNOSIS — M47812 Spondylosis without myelopathy or radiculopathy, cervical region: Secondary | ICD-10-CM | POA: Diagnosis not present

## 2012-05-02 DIAGNOSIS — M47817 Spondylosis without myelopathy or radiculopathy, lumbosacral region: Secondary | ICD-10-CM | POA: Diagnosis not present

## 2012-05-09 ENCOUNTER — Encounter (HOSPITAL_COMMUNITY)
Admission: RE | Admit: 2012-05-09 | Discharge: 2012-05-09 | Disposition: A | Discharge status: Home / Self Care | Payer: Medicare Other | Source: Ambulatory Visit | Attending: Anesthesiology | Admitting: Anesthesiology

## 2012-05-09 DIAGNOSIS — M25561 Pain in right knee: Secondary | ICD-10-CM

## 2012-05-09 DIAGNOSIS — Z96659 Presence of unspecified artificial knee joint: Secondary | ICD-10-CM | POA: Insufficient documentation

## 2012-05-09 DIAGNOSIS — M25569 Pain in unspecified knee: Secondary | ICD-10-CM | POA: Diagnosis not present

## 2012-05-09 MED ORDER — TECHNETIUM TC 99M MEDRONATE IV KIT
25.0000 | PACK | Freq: Once | INTRAVENOUS | Status: AC | PRN
  Administered 2012-05-09: 25 via INTRAVENOUS

## 2012-05-17 DIAGNOSIS — Z79899 Other long term (current) drug therapy: Secondary | ICD-10-CM | POA: Diagnosis not present

## 2012-05-17 DIAGNOSIS — I10 Essential (primary) hypertension: Secondary | ICD-10-CM | POA: Diagnosis not present

## 2012-05-17 DIAGNOSIS — R7309 Other abnormal glucose: Secondary | ICD-10-CM | POA: Diagnosis not present

## 2012-05-17 DIAGNOSIS — E782 Mixed hyperlipidemia: Secondary | ICD-10-CM | POA: Diagnosis not present

## 2012-05-28 DIAGNOSIS — M47817 Spondylosis without myelopathy or radiculopathy, lumbosacral region: Secondary | ICD-10-CM | POA: Diagnosis not present

## 2012-05-28 DIAGNOSIS — M47812 Spondylosis without myelopathy or radiculopathy, cervical region: Secondary | ICD-10-CM | POA: Diagnosis not present

## 2012-05-28 DIAGNOSIS — Z79899 Other long term (current) drug therapy: Secondary | ICD-10-CM | POA: Diagnosis not present

## 2012-05-28 DIAGNOSIS — M961 Postlaminectomy syndrome, not elsewhere classified: Secondary | ICD-10-CM | POA: Diagnosis not present

## 2012-05-28 DIAGNOSIS — R05 Cough: Secondary | ICD-10-CM | POA: Diagnosis not present

## 2012-06-06 DIAGNOSIS — M25569 Pain in unspecified knee: Secondary | ICD-10-CM | POA: Diagnosis not present

## 2012-06-06 DIAGNOSIS — M25559 Pain in unspecified hip: Secondary | ICD-10-CM | POA: Diagnosis not present

## 2012-06-07 DIAGNOSIS — I10 Essential (primary) hypertension: Secondary | ICD-10-CM | POA: Diagnosis not present

## 2012-06-07 DIAGNOSIS — Z79899 Other long term (current) drug therapy: Secondary | ICD-10-CM | POA: Diagnosis not present

## 2012-06-07 DIAGNOSIS — K6289 Other specified diseases of anus and rectum: Secondary | ICD-10-CM | POA: Diagnosis not present

## 2012-06-07 DIAGNOSIS — R1032 Left lower quadrant pain: Secondary | ICD-10-CM | POA: Diagnosis not present

## 2012-06-07 DIAGNOSIS — R1031 Right lower quadrant pain: Secondary | ICD-10-CM | POA: Diagnosis not present

## 2012-06-13 ENCOUNTER — Other Ambulatory Visit: Payer: Self-pay | Admitting: Gastroenterology

## 2012-06-25 DIAGNOSIS — M47817 Spondylosis without myelopathy or radiculopathy, lumbosacral region: Secondary | ICD-10-CM | POA: Diagnosis not present

## 2012-06-25 DIAGNOSIS — M47812 Spondylosis without myelopathy or radiculopathy, cervical region: Secondary | ICD-10-CM | POA: Diagnosis not present

## 2012-06-25 DIAGNOSIS — M961 Postlaminectomy syndrome, not elsewhere classified: Secondary | ICD-10-CM | POA: Diagnosis not present

## 2012-07-02 ENCOUNTER — Ambulatory Visit (HOSPITAL_COMMUNITY)
Admission: RE | Admit: 2012-07-02 | Discharge: 2012-07-02 | Disposition: A | Discharge status: Home / Self Care | Payer: Medicare Other | Source: Ambulatory Visit | Attending: Internal Medicine | Admitting: Internal Medicine

## 2012-07-02 ENCOUNTER — Other Ambulatory Visit (HOSPITAL_COMMUNITY): Payer: Self-pay | Admitting: Internal Medicine

## 2012-07-02 DIAGNOSIS — M25471 Effusion, right ankle: Secondary | ICD-10-CM

## 2012-07-02 DIAGNOSIS — W19XXXA Unspecified fall, initial encounter: Secondary | ICD-10-CM

## 2012-07-02 DIAGNOSIS — Z79899 Other long term (current) drug therapy: Secondary | ICD-10-CM | POA: Diagnosis not present

## 2012-07-02 DIAGNOSIS — S99929A Unspecified injury of unspecified foot, initial encounter: Secondary | ICD-10-CM | POA: Diagnosis not present

## 2012-07-02 DIAGNOSIS — S8990XA Unspecified injury of unspecified lower leg, initial encounter: Secondary | ICD-10-CM | POA: Diagnosis not present

## 2012-07-02 DIAGNOSIS — W010XXA Fall on same level from slipping, tripping and stumbling without subsequent striking against object, initial encounter: Secondary | ICD-10-CM | POA: Insufficient documentation

## 2012-07-02 DIAGNOSIS — R7309 Other abnormal glucose: Secondary | ICD-10-CM | POA: Diagnosis not present

## 2012-07-02 DIAGNOSIS — M25579 Pain in unspecified ankle and joints of unspecified foot: Secondary | ICD-10-CM | POA: Insufficient documentation

## 2012-07-02 DIAGNOSIS — M25476 Effusion, unspecified foot: Secondary | ICD-10-CM | POA: Insufficient documentation

## 2012-07-02 DIAGNOSIS — M25473 Effusion, unspecified ankle: Secondary | ICD-10-CM | POA: Diagnosis not present

## 2012-07-02 DIAGNOSIS — I10 Essential (primary) hypertension: Secondary | ICD-10-CM | POA: Diagnosis not present

## 2012-07-02 DIAGNOSIS — E782 Mixed hyperlipidemia: Secondary | ICD-10-CM | POA: Diagnosis not present

## 2012-07-02 DIAGNOSIS — Y92009 Unspecified place in unspecified non-institutional (private) residence as the place of occurrence of the external cause: Secondary | ICD-10-CM | POA: Insufficient documentation

## 2012-07-02 DIAGNOSIS — Y93E1 Activity, personal bathing and showering: Secondary | ICD-10-CM | POA: Insufficient documentation

## 2012-07-03 ENCOUNTER — Encounter (HOSPITAL_COMMUNITY): Payer: Self-pay | Admitting: *Deleted

## 2012-07-05 DIAGNOSIS — M25559 Pain in unspecified hip: Secondary | ICD-10-CM | POA: Diagnosis not present

## 2012-07-18 ENCOUNTER — Encounter (HOSPITAL_COMMUNITY): Payer: Self-pay | Admitting: Pharmacy Technician

## 2012-07-18 DIAGNOSIS — M545 Low back pain: Secondary | ICD-10-CM | POA: Diagnosis not present

## 2012-07-20 DIAGNOSIS — M531 Cervicobrachial syndrome: Secondary | ICD-10-CM | POA: Diagnosis not present

## 2012-07-20 DIAGNOSIS — M961 Postlaminectomy syndrome, not elsewhere classified: Secondary | ICD-10-CM | POA: Diagnosis not present

## 2012-07-20 DIAGNOSIS — M25569 Pain in unspecified knee: Secondary | ICD-10-CM | POA: Diagnosis not present

## 2012-07-23 ENCOUNTER — Encounter (HOSPITAL_COMMUNITY): Payer: Self-pay | Admitting: Anesthesiology

## 2012-07-23 ENCOUNTER — Encounter (HOSPITAL_COMMUNITY): Payer: Self-pay | Admitting: *Deleted

## 2012-07-23 ENCOUNTER — Ambulatory Visit (HOSPITAL_COMMUNITY): Payer: Medicare Other | Admitting: Anesthesiology

## 2012-07-23 ENCOUNTER — Ambulatory Visit (HOSPITAL_COMMUNITY)
Admission: RE | Admit: 2012-07-23 | Discharge: 2012-07-23 | Disposition: A | Discharge status: Home / Self Care | Payer: Medicare Other | Source: Ambulatory Visit | Attending: Gastroenterology | Admitting: Gastroenterology

## 2012-07-23 ENCOUNTER — Encounter (HOSPITAL_COMMUNITY)
Admission: RE | Disposition: A | Discharge status: Home / Self Care | Payer: Self-pay | Source: Ambulatory Visit | Attending: Gastroenterology

## 2012-07-23 DIAGNOSIS — J438 Other emphysema: Secondary | ICD-10-CM | POA: Insufficient documentation

## 2012-07-23 DIAGNOSIS — I471 Supraventricular tachycardia, unspecified: Secondary | ICD-10-CM | POA: Insufficient documentation

## 2012-07-23 DIAGNOSIS — E781 Pure hyperglyceridemia: Secondary | ICD-10-CM | POA: Insufficient documentation

## 2012-07-23 DIAGNOSIS — D129 Benign neoplasm of anus and anal canal: Secondary | ICD-10-CM | POA: Diagnosis not present

## 2012-07-23 DIAGNOSIS — Z96649 Presence of unspecified artificial hip joint: Secondary | ICD-10-CM | POA: Insufficient documentation

## 2012-07-23 DIAGNOSIS — R1031 Right lower quadrant pain: Secondary | ICD-10-CM | POA: Diagnosis not present

## 2012-07-23 DIAGNOSIS — R109 Unspecified abdominal pain: Secondary | ICD-10-CM | POA: Diagnosis not present

## 2012-07-23 DIAGNOSIS — Z9089 Acquired absence of other organs: Secondary | ICD-10-CM | POA: Diagnosis not present

## 2012-07-23 DIAGNOSIS — D126 Benign neoplasm of colon, unspecified: Secondary | ICD-10-CM | POA: Diagnosis not present

## 2012-07-23 DIAGNOSIS — D128 Benign neoplasm of rectum: Secondary | ICD-10-CM | POA: Diagnosis not present

## 2012-07-23 DIAGNOSIS — K589 Irritable bowel syndrome without diarrhea: Secondary | ICD-10-CM | POA: Insufficient documentation

## 2012-07-23 DIAGNOSIS — K6289 Other specified diseases of anus and rectum: Secondary | ICD-10-CM | POA: Diagnosis not present

## 2012-07-23 DIAGNOSIS — K5900 Constipation, unspecified: Secondary | ICD-10-CM | POA: Insufficient documentation

## 2012-07-23 DIAGNOSIS — R1032 Left lower quadrant pain: Secondary | ICD-10-CM | POA: Diagnosis not present

## 2012-07-23 DIAGNOSIS — IMO0002 Reserved for concepts with insufficient information to code with codable children: Secondary | ICD-10-CM | POA: Diagnosis not present

## 2012-07-23 DIAGNOSIS — Y838 Other surgical procedures as the cause of abnormal reaction of the patient, or of later complication, without mention of misadventure at the time of the procedure: Secondary | ICD-10-CM | POA: Insufficient documentation

## 2012-07-23 HISTORY — PX: COLONOSCOPY WITH PROPOFOL: SHX5780

## 2012-07-23 SURGERY — COLONOSCOPY WITH PROPOFOL
Anesthesia: General

## 2012-07-23 MED ORDER — SODIUM CHLORIDE 0.9 % IV SOLN: INTRAVENOUS | Status: DC

## 2012-07-23 MED ORDER — LACTATED RINGERS IV SOLN
INTRAVENOUS | Status: DC
  Administered 2012-07-23 (×2): via INTRAVENOUS

## 2012-07-23 MED ORDER — KETAMINE HCL 10 MG/ML IJ SOLN
INTRAMUSCULAR | Status: DC | PRN
  Administered 2012-07-23: 10 mg via INTRAVENOUS
  Administered 2012-07-23: 20 mg via INTRAVENOUS
  Administered 2012-07-23: 10 mg via INTRAVENOUS

## 2012-07-23 MED ORDER — PROPOFOL 10 MG/ML IV EMUL
INTRAVENOUS | Status: DC | PRN
  Administered 2012-07-23: 40 mg via INTRAVENOUS

## 2012-07-23 MED ORDER — PROPOFOL INFUSION 10 MG/ML OPTIME
INTRAVENOUS | Status: DC | PRN
  Administered 2012-07-23: 140 ug/kg/min via INTRAVENOUS

## 2012-07-23 SURGICAL SUPPLY — 22 items

## 2012-07-23 NOTE — H&P (Signed)
Procedure: Diagnostic colonoscopy to evaluate abdominal pain and rectal pain  History: The patient is a 67 year old female born June 06, 1945. The patient requires chronic narcotic therapy which is associated with constipation. To control her constipation she takes mineral oil.  The patient underwent a normal colonoscopy on 01/23/2003.  The patient is experiencing rectal discomfort, intermittent left lower quadrant abdominal discomfort, and intermittent right lower quadrant abdominal discomfort. She reports no gastrointestinal bleeding.  The patient is scheduled to undergo a diagnostic colonoscopy.  Chronic medications: theophylline. Valium. Methadone. Oxycodone. Zoloft. Mucinex. Fluticasone. Lumigan eyedrops. Systane eyedrops. Librax. Propranolol. Simvastatin. Vitamin D. Digoxin. Synthroid.  Past medical and surgical history: Hypertriglyceridemia. Irritable bowel syndrome. Allergic rhinitis. Paroxysmal atrial tachycardia. Avascular necrosis of the femoral head. Emphysema. Depression. Cervical spine surgery. Left hip replacement surgery. Cholecystectomy.   Habits: The patient continues to smoke cigarettes. He consumes alcohol in moderation.  Allergies: Lopid. Penicillin. Sulfa. Codeine causes nausea. Aspirin causes stomach upset. Albuterol.  Exam: The patient is alert and lying comfortably on the endoscopy stretcher. Cardiac exam reveals a regular rhythm. Lungs are clear to auscultation. Abdomen is soft, flat, and nontender to palpation in all quadrants.  Plan: Proceed with diagnostic colonoscopy.

## 2012-07-23 NOTE — Preoperative (Signed)
Beta Blockers   Reason not to administer Beta Blockers:Not Applicable 

## 2012-07-23 NOTE — Transfer of Care (Signed)
Immediate Anesthesia Transfer of Care Note  Patient: Destiny Hughes  Procedure(s) Performed: Procedure(s): COLONOSCOPY WITH PROPOFOL (N/A)  Patient Location: PACU  Anesthesia Type:MAC  Level of Consciousness: awake, alert , oriented and patient cooperative  Airway & Oxygen Therapy: Patient Spontanous Breathing and Patient connected to face mask oxygen  Post-op Assessment: Report given to PACU RN, Post -op Vital signs reviewed and stable and Patient moving all extremities X 4  Post vital signs: Reviewed and stable  Complications: No apparent anesthesia complications

## 2012-07-23 NOTE — Anesthesia Preprocedure Evaluation (Addendum)
Anesthesia Evaluation  Patient identified by MRN, date of birth, ID band Patient awake    Reviewed: Allergy & Precautions, H&P , NPO status , Patient's Chart, lab work & pertinent test results, reviewed documented beta blocker date and time   History of Anesthesia Complications (+) DIFFICULT AIRWAY  Airway Mallampati: III TM Distance: >3 FB Neck ROM: Limited    Dental  (+) Dental Advisory Given   Pulmonary neg pulmonary ROS, shortness of breath, COPD COPD inhaler, Current Smoker,  breath sounds clear to auscultation  + decreased breath sounds      Cardiovascular + dysrhythmias + Valvular Problems/Murmurs Rhythm:Regular Rate:Normal  Hx PAT, Rx-inderal   Neuro/Psych  Headaches, PSYCHIATRIC DISORDERS Depression S/p C-fusion. Spinal cord stimulator.    GI/Hepatic Neg liver ROS, GERD-  ,  Endo/Other  Hypothyroidism Thyroid replacement  Renal/GU negative Renal ROS     Musculoskeletal negative musculoskeletal ROS (+)   Abdominal   Peds  Hematology negative hematology ROS (+)   Anesthesia Other Findings Limited neck ext.   Reproductive/Obstetrics negative OB ROS                           Anesthesia Physical  Anesthesia Plan  ASA: III  Anesthesia Plan: General   Post-op Pain Management:    Induction:   Airway Management Planned: Simple Face Mask  Additional Equipment:   Intra-op Plan:   Post-operative Plan: Extubation in OR  Informed Consent: I have reviewed the patients History and Physical, chart, labs and discussed the procedure including the risks, benefits and alternatives for the proposed anesthesia with the patient or authorized representative who has indicated his/her understanding and acceptance.   Dental advisory given  Plan Discussed with: CRNA  Anesthesia Plan Comments:         Anesthesia Quick Evaluation

## 2012-07-23 NOTE — Anesthesia Postprocedure Evaluation (Signed)
Anesthesia Post Note  Patient: Destiny Hughes  Procedure(s) Performed: Procedure(s) (LRB): COLONOSCOPY WITH PROPOFOL (N/A)  Anesthesia type: MAC  Patient location: PACU  Post pain: Pain level controlled  Post assessment: Post-op Vital signs reviewed  Last Vitals: BP 136/83  Pulse 64  Temp(Src) 36.6 C (Oral)  Resp 18  Ht 5\' 6"  (1.676 m)  Wt 150 lb (68.04 kg)  BMI 24.22 kg/m2  SpO2 95%  Post vital signs: Reviewed  Level of consciousness: awake  Complications: No apparent anesthesia complications

## 2012-07-23 NOTE — Op Note (Signed)
Procedure: Diagnostic colonoscopy to evaluate abdominal and rectal pain  Endoscopist: Danise Edge  Premedication: Propofol administered by anesthesia  Procedure: The patient was placed in the left lateral decubitus position. Anal inspection and digital rectal exam were normal. The Pentax pediatric colonoscope was introduced into the rectum and advanced to the cecum. A normal-appearing ileocecal valve and appendiceal orifice were identified. Colonic preparation for the exam today was good.  Rectum. From the mid rectum a 5 mm sessile polyp was removed with the cold snare and cold biopsy forceps. To control post polypectomy bleeding, an Endo Clip was applied to the polypectomy site. Retroflex view of the distal rectum was normal.  Sigmoid colon and descending colon. Normal.  Splenic flexure. Normal.  Transverse colon. Normal.  Hepatic flexure. Normal.  Ascending colon. From the proximal ascending colon a 3 mm sessile polyp was removed with the cold biopsy forceps.  Cecum and ileocecal valve. Normal.  Assessment:  #1. From the mid rectum, a 5 mm sessile polyp was removed with the cold snare. To control post polypectomy bleeding, an Endo Clip was applied to the polypectomy site.  #2. From the proximal ascending colon, a 3 mm sessile polyp was removed with the cold biopsy forceps.  Recommendations: If the colon polyps returned neoplastic pathologically, the patient should undergo a surveillance colonoscopy in 5 years.

## 2012-07-24 ENCOUNTER — Encounter (HOSPITAL_COMMUNITY): Payer: Self-pay | Admitting: Gastroenterology

## 2012-08-21 ENCOUNTER — Other Ambulatory Visit (HOSPITAL_COMMUNITY)
Admission: RE | Admit: 2012-08-21 | Discharge: 2012-08-21 | Disposition: A | Discharge status: Home / Self Care | Payer: Medicare Other | Source: Ambulatory Visit | Attending: Obstetrics and Gynecology | Admitting: Obstetrics and Gynecology

## 2012-08-21 ENCOUNTER — Other Ambulatory Visit: Payer: Self-pay | Admitting: Obstetrics and Gynecology

## 2012-08-21 DIAGNOSIS — Z1151 Encounter for screening for human papillomavirus (HPV): Secondary | ICD-10-CM | POA: Diagnosis not present

## 2012-08-21 DIAGNOSIS — N949 Unspecified condition associated with female genital organs and menstrual cycle: Secondary | ICD-10-CM | POA: Diagnosis not present

## 2012-08-21 DIAGNOSIS — Z01419 Encounter for gynecological examination (general) (routine) without abnormal findings: Secondary | ICD-10-CM | POA: Diagnosis not present

## 2012-08-21 DIAGNOSIS — Z124 Encounter for screening for malignant neoplasm of cervix: Secondary | ICD-10-CM | POA: Diagnosis not present

## 2012-08-29 DIAGNOSIS — M47817 Spondylosis without myelopathy or radiculopathy, lumbosacral region: Secondary | ICD-10-CM | POA: Diagnosis not present

## 2012-08-29 DIAGNOSIS — M961 Postlaminectomy syndrome, not elsewhere classified: Secondary | ICD-10-CM | POA: Diagnosis not present

## 2012-08-29 DIAGNOSIS — M531 Cervicobrachial syndrome: Secondary | ICD-10-CM | POA: Diagnosis not present

## 2012-08-29 DIAGNOSIS — Z79899 Other long term (current) drug therapy: Secondary | ICD-10-CM | POA: Diagnosis not present

## 2012-09-03 DIAGNOSIS — N949 Unspecified condition associated with female genital organs and menstrual cycle: Secondary | ICD-10-CM | POA: Diagnosis not present

## 2012-09-19 DIAGNOSIS — IMO0001 Reserved for inherently not codable concepts without codable children: Secondary | ICD-10-CM | POA: Diagnosis not present

## 2012-09-19 DIAGNOSIS — M25569 Pain in unspecified knee: Secondary | ICD-10-CM | POA: Diagnosis not present

## 2012-09-19 DIAGNOSIS — M961 Postlaminectomy syndrome, not elsewhere classified: Secondary | ICD-10-CM | POA: Diagnosis not present

## 2012-09-19 DIAGNOSIS — Z79899 Other long term (current) drug therapy: Secondary | ICD-10-CM | POA: Diagnosis not present

## 2012-10-04 DIAGNOSIS — M961 Postlaminectomy syndrome, not elsewhere classified: Secondary | ICD-10-CM | POA: Diagnosis not present

## 2012-10-04 DIAGNOSIS — M47817 Spondylosis without myelopathy or radiculopathy, lumbosacral region: Secondary | ICD-10-CM | POA: Diagnosis not present

## 2012-10-04 DIAGNOSIS — M25569 Pain in unspecified knee: Secondary | ICD-10-CM | POA: Diagnosis not present

## 2012-10-25 DIAGNOSIS — M47812 Spondylosis without myelopathy or radiculopathy, cervical region: Secondary | ICD-10-CM | POA: Diagnosis not present

## 2012-10-25 DIAGNOSIS — M47817 Spondylosis without myelopathy or radiculopathy, lumbosacral region: Secondary | ICD-10-CM | POA: Diagnosis not present

## 2012-10-25 DIAGNOSIS — M961 Postlaminectomy syndrome, not elsewhere classified: Secondary | ICD-10-CM | POA: Diagnosis not present

## 2012-11-13 DIAGNOSIS — H4010X0 Unspecified open-angle glaucoma, stage unspecified: Secondary | ICD-10-CM | POA: Diagnosis not present

## 2012-11-13 DIAGNOSIS — H2510 Age-related nuclear cataract, unspecified eye: Secondary | ICD-10-CM | POA: Diagnosis not present

## 2012-11-13 DIAGNOSIS — H43399 Other vitreous opacities, unspecified eye: Secondary | ICD-10-CM | POA: Diagnosis not present

## 2012-11-15 DIAGNOSIS — M961 Postlaminectomy syndrome, not elsewhere classified: Secondary | ICD-10-CM | POA: Diagnosis not present

## 2012-11-15 DIAGNOSIS — M1710 Unilateral primary osteoarthritis, unspecified knee: Secondary | ICD-10-CM | POA: Diagnosis not present

## 2012-11-15 DIAGNOSIS — M47817 Spondylosis without myelopathy or radiculopathy, lumbosacral region: Secondary | ICD-10-CM | POA: Diagnosis not present

## 2012-11-16 DIAGNOSIS — Z79899 Other long term (current) drug therapy: Secondary | ICD-10-CM | POA: Diagnosis not present

## 2012-11-16 DIAGNOSIS — E782 Mixed hyperlipidemia: Secondary | ICD-10-CM | POA: Diagnosis not present

## 2012-11-16 DIAGNOSIS — E559 Vitamin D deficiency, unspecified: Secondary | ICD-10-CM | POA: Diagnosis not present

## 2012-11-16 DIAGNOSIS — R7309 Other abnormal glucose: Secondary | ICD-10-CM | POA: Diagnosis not present

## 2012-11-16 DIAGNOSIS — I10 Essential (primary) hypertension: Secondary | ICD-10-CM | POA: Diagnosis not present

## 2012-12-10 DIAGNOSIS — R300 Dysuria: Secondary | ICD-10-CM | POA: Diagnosis not present

## 2012-12-10 DIAGNOSIS — R05 Cough: Secondary | ICD-10-CM | POA: Diagnosis not present

## 2012-12-10 DIAGNOSIS — Z79899 Other long term (current) drug therapy: Secondary | ICD-10-CM | POA: Diagnosis not present

## 2012-12-10 DIAGNOSIS — M25569 Pain in unspecified knee: Secondary | ICD-10-CM | POA: Diagnosis not present

## 2012-12-13 DIAGNOSIS — M47817 Spondylosis without myelopathy or radiculopathy, lumbosacral region: Secondary | ICD-10-CM | POA: Diagnosis not present

## 2012-12-13 DIAGNOSIS — M25569 Pain in unspecified knee: Secondary | ICD-10-CM | POA: Diagnosis not present

## 2012-12-13 DIAGNOSIS — M961 Postlaminectomy syndrome, not elsewhere classified: Secondary | ICD-10-CM | POA: Diagnosis not present

## 2012-12-31 DIAGNOSIS — J40 Bronchitis, not specified as acute or chronic: Secondary | ICD-10-CM | POA: Diagnosis not present

## 2013-01-16 DIAGNOSIS — J40 Bronchitis, not specified as acute or chronic: Secondary | ICD-10-CM | POA: Diagnosis not present

## 2013-02-01 DIAGNOSIS — Z79899 Other long term (current) drug therapy: Secondary | ICD-10-CM | POA: Diagnosis not present

## 2013-02-01 DIAGNOSIS — M961 Postlaminectomy syndrome, not elsewhere classified: Secondary | ICD-10-CM | POA: Diagnosis not present

## 2013-02-01 DIAGNOSIS — G894 Chronic pain syndrome: Secondary | ICD-10-CM | POA: Diagnosis not present

## 2013-02-19 ENCOUNTER — Ambulatory Visit: Payer: Self-pay | Admitting: Internal Medicine

## 2013-02-19 ENCOUNTER — Encounter: Payer: Self-pay | Admitting: Internal Medicine

## 2013-03-04 DIAGNOSIS — M47817 Spondylosis without myelopathy or radiculopathy, lumbosacral region: Secondary | ICD-10-CM | POA: Diagnosis not present

## 2013-03-04 DIAGNOSIS — G548 Other nerve root and plexus disorders: Secondary | ICD-10-CM | POA: Diagnosis not present

## 2013-03-04 DIAGNOSIS — M961 Postlaminectomy syndrome, not elsewhere classified: Secondary | ICD-10-CM | POA: Diagnosis not present

## 2013-03-28 DIAGNOSIS — D485 Neoplasm of uncertain behavior of skin: Secondary | ICD-10-CM | POA: Diagnosis not present

## 2013-03-28 DIAGNOSIS — L821 Other seborrheic keratosis: Secondary | ICD-10-CM | POA: Diagnosis not present

## 2013-04-03 DIAGNOSIS — M961 Postlaminectomy syndrome, not elsewhere classified: Secondary | ICD-10-CM | POA: Diagnosis not present

## 2013-04-03 DIAGNOSIS — G894 Chronic pain syndrome: Secondary | ICD-10-CM | POA: Diagnosis not present

## 2013-04-03 DIAGNOSIS — M87059 Idiopathic aseptic necrosis of unspecified femur: Secondary | ICD-10-CM | POA: Diagnosis not present

## 2013-04-15 ENCOUNTER — Ambulatory Visit (INDEPENDENT_AMBULATORY_CARE_PROVIDER_SITE_OTHER): Payer: Medicare Other | Admitting: Internal Medicine

## 2013-04-15 ENCOUNTER — Encounter: Payer: Self-pay | Admitting: Internal Medicine

## 2013-04-15 VITALS — BP 134/76 | HR 60 | Temp 97.5°F | Resp 18 | Wt 154.2 lb

## 2013-04-15 DIAGNOSIS — Z79899 Other long term (current) drug therapy: Secondary | ICD-10-CM

## 2013-04-15 DIAGNOSIS — I10 Essential (primary) hypertension: Secondary | ICD-10-CM | POA: Insufficient documentation

## 2013-04-15 DIAGNOSIS — R7309 Other abnormal glucose: Secondary | ICD-10-CM

## 2013-04-15 DIAGNOSIS — R7303 Prediabetes: Secondary | ICD-10-CM | POA: Insufficient documentation

## 2013-04-15 DIAGNOSIS — E559 Vitamin D deficiency, unspecified: Secondary | ICD-10-CM

## 2013-04-15 DIAGNOSIS — E785 Hyperlipidemia, unspecified: Secondary | ICD-10-CM

## 2013-04-15 DIAGNOSIS — E782 Mixed hyperlipidemia: Secondary | ICD-10-CM

## 2013-04-15 LAB — CBC WITH DIFFERENTIAL/PLATELET
Basophils Absolute: 0 10*3/uL (ref 0.0–0.1)
Basophils Relative: 0 % (ref 0–1)
EOS ABS: 0.1 10*3/uL (ref 0.0–0.7)
EOS PCT: 2 % (ref 0–5)
HEMATOCRIT: 47 % — AB (ref 36.0–46.0)
HEMOGLOBIN: 16.1 g/dL — AB (ref 12.0–15.0)
LYMPHS ABS: 2.5 10*3/uL (ref 0.7–4.0)
LYMPHS PCT: 27 % (ref 12–46)
MCH: 30.3 pg (ref 26.0–34.0)
MCHC: 34.3 g/dL (ref 30.0–36.0)
MCV: 88.5 fL (ref 78.0–100.0)
MONO ABS: 0.6 10*3/uL (ref 0.1–1.0)
MONOS PCT: 7 % (ref 3–12)
Neutro Abs: 6.1 10*3/uL (ref 1.7–7.7)
Neutrophils Relative %: 64 % (ref 43–77)
PLATELETS: 296 10*3/uL (ref 150–400)
RBC: 5.31 MIL/uL — AB (ref 3.87–5.11)
RDW: 14.6 % (ref 11.5–15.5)
WBC: 9.3 10*3/uL (ref 4.0–10.5)

## 2013-04-15 NOTE — Progress Notes (Signed)
Patient ID: Destiny Hughes, female   DOB: 1945-04-17, 68 y.o.   MRN: [id number]   This very nice 67 y.o. MWF presents for 3 month follow up with Hypertension, Hyperlipidemia, Pre-Diabetes and Vitamin D Deficiency.    HTN predates since the 1990's.. BP has been controlled at home. Today's BP: 134/76 mmHg . She also has Hx/o pSVT and is followed by Dr Mare Ferrari with Nl cardiolytes in 2007 and 2011 and mildly abn 2DEC with mild AoSclerosis. Patient denies any cardiac type chest pain, palpitations, dyspnea/orthopnea/PND, dizziness, claudication, or dependent edema.   Hyperlipidemia is not controlled with diet & she absolutely refuses to take any kind of meds for her cholesterol.  Last Cholesterol was 211, Triglycerides were 287, HDL 40 and LDL 114. Patient denies myalgias or other med SE's.    Also, the patient has history of PreDiabetes with A1c 6.0% in 2011and with last A1c of 6.0% in Sept 2014. Patient denies any symptoms of reactive hypoglycemia, diabetic polys, paresthesias or visual blurring.   Patient is followed by Dr Nicholaus Bloom at his [facility] pain [facility] clinic. She also had spinal cord stimulator implanted by Dr Ellene Route. She has alleged had 7 surgeries including 2 full and 1 partial replacement of the Rt Knee. Apparently she has been referred to [facility] for opinion  WRT the Rt knee.    Further, Patient has history of Vitamin D Deficiency of 31 in 2013 with last vitamin D still very low at 37 in July 2014. Patient takes only 100 units - not the 8000 units that she has been recommended.    Medication List         BIOTIN 5000 5 MG Caps  Generic drug:  Biotin  Take 1 capsule by mouth daily.     clidinium-chlordiazePOXIDE 5-2.5 MG per capsule  Commonly known as:  LIBRAX  Take 1 capsule by mouth 2 (two) times daily.     diazepam 5 MG tablet  Commonly known as:  VALIUM  Take 5 mg by mouth every 6 (six) hours as needed (for muscle spams).     digoxin 0.25 MG tablet  Commonly  known as:  LANOXIN  Take 250 mcg by mouth every morning.     docusate sodium 100 MG capsule  Commonly known as:  COLACE  Take 100 mg by mouth daily as needed for constipation.     levothyroxine 150 MCG tablet  Commonly known as:  SYNTHROID, LEVOTHROID  Take 150 mcg by mouth daily before breakfast.     methadone 5 MG tablet  Commonly known as:  DOLOPHINE  Take 5 mg by mouth every 6 (six) hours.     MUCINEX ALLERGY PO  Take 300 mg by mouth. Takes 2 daily     oxyCODONE 15 MG immediate release tablet  Commonly known as:  ROXICODONE  Take 15 mg by mouth every 6 (six) hours as needed for pain.     promethazine 25 MG tablet  Commonly known as:  PHENERGAN  Take 25 mg by mouth every 6 (six) hours as needed for nausea.     propranolol 80 MG tablet  Commonly known as:  INDERAL  Take 80 mg by mouth 2 (two) times daily.     sertraline 100 MG tablet  Commonly known as:  ZOLOFT  Take 150 mg by mouth every morning.     theophylline 200 MG 24 hr capsule  Commonly known as:  THEO-24  Take 200 mg by mouth 2 (two) times daily as needed (  wheezing).     trolamine salicylate 10 % cream  Commonly known as:  ASPERCREME  Apply 1 application topically daily as needed (for pain). Apply to painful areas on body     VITAMIN D PO  Take 1,000 mg by mouth daily. Taking 1000 daily         Allergies  Allergen Reactions  . Ventolin [Kdc:Albuterol] Anaphylaxis  . Albuterol   . Codeine Nausea And Vomiting  . Penicillins Swelling  . Ecotrin [Aspirin] Nausea And Vomiting    PMHx:   Past Medical History  Diagnosis Date  . Palpitation   . Tachycardia   . Dyspnea   . Hyperlipemia   . Glaucoma   . Chronic neck pain   . Chronic back pain   . Collapse of right lung   . Hypothyroidism     tx. Levothyroxine  . Blood transfusion     after Thoracic surgery ?'04  . GERD (gastroesophageal reflux disease)     tx. TUMS  . Depression   . COPD (chronic obstructive pulmonary disease)     Hx.  Bronchitis occ, 01-25-11 somes issue now taking  Z-pak-started 01-24-11/Scar tissue  present  from previous lung collapse  . Heart murmur   . Headache(784.0) 01-25-11    neck and nerve pain related.  . Arthritis 01-25-11    back and most joints  . Kidney calculi 01-25-11    1964  . Neuromuscular disorder 01-25-11    Pain/nerve stimulator implanted -2 yrs ago.  Marland Kitchen Dysrhythmia     hx.PAT- tx. Inderal    FHx:    Reviewed / unchanged  SHx:    Reviewed / unchanged  Systems Review: Constitutional: Denies fever, chills, wt changes, headaches, insomnia, fatigue, night sweats, change in appetite. Eyes: Denies redness, blurred vision, diplopia, discharge, itchy, watery eyes.  ENT: Denies discharge, congestion, post nasal drip, epistaxis, sore throat, earache, hearing loss, dental pain, tinnitus, vertigo, sinus pain, snoring.  CV: Denies chest pain, palpitations, irregular heartbeat, syncope, dyspnea, diaphoresis, orthopnea, PND, claudication, edema. Respiratory: denies cough, dyspnea, DOE, pleurisy, hoarseness, laryngitis, wheezing.  Gastrointestinal: Denies dysphagia, odynophagia, heartburn, reflux, water brash, abdominal pain or cramps, nausea, vomiting, bloating, diarrhea, constipation, hematemesis, melena, hematochezia,  or hemorrhoids. Genitourinary: Denies dysuria, frequency, urgency, nocturia, hesitancy, discharge, hematuria, flank pain. Musculoskeletal: c/o LBP and Rt knee pains. Skin: Denies pruritus, rash, hives, warts, acne, eczema, change in skin lesion(s). Neuro: No weakness, tremor, incoordination, spasms, paresthesia, or pain. Psychiatric: Denies confusion, memory loss, or sensory loss. Endo: Denies change in weight, skin, hair change.  Heme/Lymph: No excessive bleeding, bruising, orenlarged lymph nodes.  Filed Vitals:   04/15/13 1606  BP: 134/76  Pulse: 60  Temp: 97.5 F (36.4 C)  Resp: 18    Estimated body mass index is 24.9 kg/(m^2) as calculated from the following:    Height as of 07/23/12: 5\' 6"  (1.676 m).   Weight as of this encounter: 154 lb 3.2 oz (69.945 kg).  On Exam: Appears well nourished - in no distress. Eyes: PERRLA, EOMs, conjunctiva no swelling or erythema. Sinuses: No frontal/maxillary tenderness ENT/Mouth: EAC's clear, TM's nl w/o erythema, bulging. Nares clear w/o erythema, swelling, exudates. Oropharynx clear without erythema or exudates. Oral hygiene is good. Tongue normal, non obstructing. Hearing intact.  Neck: Supple. Thyroid nl. Car 2+/2+ without bruits, nodes or JVD. Chest: Respirations nl with BS clear & equal w/o rales, rhonchi, wheezing or stridor.  Cor: Heart sounds normal w/ regular rate and rhythm without sig. murmurs, gallops, clicks, or  rubs. Peripheral pulses normal and equal  without edema.  Abdomen: Soft & bowel sounds normal. Non-tender w/o guarding, rebound, hernias, masses, or organomegaly.  Lymphatics: Unremarkable.  Musculoskeletal: Very boggy STS around the Rt knee with a vertical anterior scar. Gait is slow and unsteady being stabilized with a cane. Skin: Warm, dry without exposed rashes, lesions, ecchymosis apparent.  Neuro: Cranial nerves intact, reflexes equal bilaterally. Sensory-motor testing grossly intact. Tendon reflexes grossly intact.  Pysch: Alert & oriented x 3. Insight and judgement nl & appropriate. No ideations.  Assessment and Plan:  1. Hypertension - Continue monitor blood pressure at home. Continue diet/meds same.  2. Hyperlipidemia - Continue diet/meds, exercise,& lifestyle modifications. Continue monitor periodic cholesterol/liver & renal functions   3. Pre-diabetes/Insulin Resistance - Continue diet, exercise, lifestyle modifications. Monitor appropriate labs.  4. Vitamin D Deficiency - Continue supplementation.  5. DJD/DDD & Chronic Pain Syndrome  Recommended regular exercise, BP monitoring, weight control, and discussed med and SE's. Recommended labs to assess and monitor clinical status.  Further disposition pending results of labs.

## 2013-04-15 NOTE — Patient Instructions (Signed)

## 2013-04-16 LAB — HEMOGLOBIN A1C
Hgb A1c MFr Bld: 6 % — ABNORMAL HIGH (ref <5.7)
Mean Plasma Glucose: 126 mg/dL — ABNORMAL HIGH (ref <117)

## 2013-04-16 LAB — HEPATIC FUNCTION PANEL
ALT: 15 U/L (ref 0–35)
AST: 17 U/L (ref 0–37)
Albumin: 4.6 g/dL (ref 3.5–5.2)
Alkaline Phosphatase: 75 U/L (ref 39–117)
Bilirubin, Direct: 0.1 mg/dL (ref 0.0–0.3)
Indirect Bilirubin: 0.5 mg/dL (ref 0.0–0.9)
Total Bilirubin: 0.6 mg/dL (ref 0.3–1.2)
Total Protein: 7.3 g/dL (ref 6.0–8.3)

## 2013-04-16 LAB — LIPID PANEL
Cholesterol: 231 mg/dL — ABNORMAL HIGH (ref 0–200)
HDL: 45 mg/dL (ref >39)
LDL Cholesterol: 125 mg/dL — ABNORMAL HIGH (ref 0–99)
Total CHOL/HDL Ratio: 5.1 ratio
Triglycerides: 307 mg/dL — ABNORMAL HIGH (ref <150)
VLDL: 61 mg/dL — ABNORMAL HIGH (ref 0–40)

## 2013-04-16 LAB — BASIC METABOLIC PANEL WITHOUT GFR
BUN: 17 mg/dL (ref 6–23)
CO2: 29 meq/L (ref 19–32)
Calcium: 9.9 mg/dL (ref 8.4–10.5)
Chloride: 96 meq/L (ref 96–112)
Creat: 1.09 mg/dL (ref 0.50–1.10)
GFR, Est African American: 60 mL/min
GFR, Est Non African American: 52 mL/min — ABNORMAL LOW
Glucose, Bld: 107 mg/dL — ABNORMAL HIGH (ref 70–99)
Potassium: 4.3 meq/L (ref 3.5–5.3)
Sodium: 132 meq/L — ABNORMAL LOW (ref 135–145)

## 2013-04-16 LAB — MAGNESIUM: Magnesium: 1.9 mg/dL (ref 1.5–2.5)

## 2013-04-16 LAB — TSH: TSH: 8.07 u[IU]/mL — AB (ref 0.350–4.500)

## 2013-04-16 LAB — INSULIN, FASTING: INSULIN FASTING, SERUM: 15 u[IU]/mL (ref 3–28)

## 2013-04-16 LAB — VITAMIN D 25 HYDROXY (VIT D DEFICIENCY, FRACTURES): Vit D, 25-Hydroxy: 34 ng/mL (ref 30–89)

## 2013-04-18 ENCOUNTER — Other Ambulatory Visit: Payer: Self-pay | Admitting: Internal Medicine

## 2013-04-18 ENCOUNTER — Other Ambulatory Visit: Payer: Self-pay | Admitting: Emergency Medicine

## 2013-04-18 MED ORDER — CILIDINIUM-CHLORDIAZEPOXIDE 2.5-5 MG PO CAPS: 1.0000 | ORAL_CAPSULE | Freq: Two times a day (BID) | ORAL | Status: DC

## 2013-04-22 DIAGNOSIS — Z96659 Presence of unspecified artificial knee joint: Secondary | ICD-10-CM | POA: Diagnosis not present

## 2013-04-22 DIAGNOSIS — M25569 Pain in unspecified knee: Secondary | ICD-10-CM | POA: Diagnosis not present

## 2013-04-22 DIAGNOSIS — G8929 Other chronic pain: Secondary | ICD-10-CM | POA: Diagnosis not present

## 2013-05-01 DIAGNOSIS — G894 Chronic pain syndrome: Secondary | ICD-10-CM | POA: Diagnosis not present

## 2013-05-01 DIAGNOSIS — M47817 Spondylosis without myelopathy or radiculopathy, lumbosacral region: Secondary | ICD-10-CM | POA: Diagnosis not present

## 2013-05-01 DIAGNOSIS — M961 Postlaminectomy syndrome, not elsewhere classified: Secondary | ICD-10-CM | POA: Diagnosis not present

## 2013-05-01 DIAGNOSIS — M47812 Spondylosis without myelopathy or radiculopathy, cervical region: Secondary | ICD-10-CM | POA: Diagnosis not present

## 2013-05-29 DIAGNOSIS — Z79899 Other long term (current) drug therapy: Secondary | ICD-10-CM | POA: Diagnosis not present

## 2013-05-29 DIAGNOSIS — M961 Postlaminectomy syndrome, not elsewhere classified: Secondary | ICD-10-CM | POA: Diagnosis not present

## 2013-05-29 DIAGNOSIS — M47817 Spondylosis without myelopathy or radiculopathy, lumbosacral region: Secondary | ICD-10-CM | POA: Diagnosis not present

## 2013-06-26 ENCOUNTER — Other Ambulatory Visit: Payer: Self-pay | Admitting: *Deleted

## 2013-06-26 MED ORDER — METOPROLOL TARTRATE 100 MG PO TABS: ORAL_TABLET | ORAL | Status: DC

## 2013-07-03 DIAGNOSIS — M47817 Spondylosis without myelopathy or radiculopathy, lumbosacral region: Secondary | ICD-10-CM | POA: Diagnosis not present

## 2013-07-03 DIAGNOSIS — Z79899 Other long term (current) drug therapy: Secondary | ICD-10-CM | POA: Diagnosis not present

## 2013-07-03 DIAGNOSIS — M961 Postlaminectomy syndrome, not elsewhere classified: Secondary | ICD-10-CM | POA: Diagnosis not present

## 2013-07-03 DIAGNOSIS — IMO0002 Reserved for concepts with insufficient information to code with codable children: Secondary | ICD-10-CM | POA: Diagnosis not present

## 2013-07-03 DIAGNOSIS — M1710 Unilateral primary osteoarthritis, unspecified knee: Secondary | ICD-10-CM | POA: Diagnosis not present

## 2013-07-15 ENCOUNTER — Ambulatory Visit: Payer: Self-pay | Admitting: Physician Assistant

## 2013-07-24 ENCOUNTER — Ambulatory Visit (INDEPENDENT_AMBULATORY_CARE_PROVIDER_SITE_OTHER): Payer: Medicare Other | Admitting: Physician Assistant

## 2013-07-24 ENCOUNTER — Ambulatory Visit (HOSPITAL_COMMUNITY)
Admission: RE | Admit: 2013-07-24 | Discharge: 2013-07-24 | Disposition: A | Discharge status: Home / Self Care | Payer: Medicare Other | Source: Ambulatory Visit | Attending: Physician Assistant | Admitting: Physician Assistant

## 2013-07-24 ENCOUNTER — Other Ambulatory Visit (HOSPITAL_COMMUNITY): Payer: Self-pay | Admitting: Anesthesiology

## 2013-07-24 ENCOUNTER — Encounter: Payer: Self-pay | Admitting: Physician Assistant

## 2013-07-24 VITALS — HR 56 | Temp 98.1°F | Resp 16 | Wt 155.0 lb

## 2013-07-24 DIAGNOSIS — E2839 Other primary ovarian failure: Secondary | ICD-10-CM

## 2013-07-24 DIAGNOSIS — R079 Chest pain, unspecified: Secondary | ICD-10-CM

## 2013-07-24 DIAGNOSIS — Z0000 Encounter for general adult medical examination without abnormal findings: Secondary | ICD-10-CM | POA: Diagnosis not present

## 2013-07-24 DIAGNOSIS — M79609 Pain in unspecified limb: Secondary | ICD-10-CM | POA: Diagnosis not present

## 2013-07-24 DIAGNOSIS — E785 Hyperlipidemia, unspecified: Secondary | ICD-10-CM | POA: Diagnosis not present

## 2013-07-24 DIAGNOSIS — F329 Major depressive disorder, single episode, unspecified: Secondary | ICD-10-CM

## 2013-07-24 DIAGNOSIS — Z79899 Other long term (current) drug therapy: Secondary | ICD-10-CM

## 2013-07-24 DIAGNOSIS — Z9181 History of falling: Secondary | ICD-10-CM

## 2013-07-24 DIAGNOSIS — W19XXXA Unspecified fall, initial encounter: Secondary | ICD-10-CM | POA: Insufficient documentation

## 2013-07-24 DIAGNOSIS — R7309 Other abnormal glucose: Secondary | ICD-10-CM | POA: Diagnosis not present

## 2013-07-24 DIAGNOSIS — I10 Essential (primary) hypertension: Secondary | ICD-10-CM

## 2013-07-24 DIAGNOSIS — H409 Unspecified glaucoma: Secondary | ICD-10-CM

## 2013-07-24 DIAGNOSIS — M79644 Pain in right finger(s): Secondary | ICD-10-CM

## 2013-07-24 DIAGNOSIS — S6990XA Unspecified injury of unspecified wrist, hand and finger(s), initial encounter: Secondary | ICD-10-CM | POA: Diagnosis not present

## 2013-07-24 DIAGNOSIS — E559 Vitamin D deficiency, unspecified: Secondary | ICD-10-CM

## 2013-07-24 DIAGNOSIS — F17200 Nicotine dependence, unspecified, uncomplicated: Secondary | ICD-10-CM

## 2013-07-24 DIAGNOSIS — F32A Depression, unspecified: Secondary | ICD-10-CM

## 2013-07-24 LAB — CBC WITH DIFFERENTIAL/PLATELET
BASOS ABS: 0 10*3/uL (ref 0.0–0.1)
BASOS PCT: 0 % (ref 0–1)
EOS ABS: 0.2 10*3/uL (ref 0.0–0.7)
Eosinophils Relative: 2 % (ref 0–5)
HEMATOCRIT: 45.7 % (ref 36.0–46.0)
HEMOGLOBIN: 15.9 g/dL — AB (ref 12.0–15.0)
Lymphocytes Relative: 32 % (ref 12–46)
Lymphs Abs: 2.6 10*3/uL (ref 0.7–4.0)
MCH: 30.4 pg (ref 26.0–34.0)
MCHC: 34.8 g/dL (ref 30.0–36.0)
MCV: 87.4 fL (ref 78.0–100.0)
MONO ABS: 0.6 10*3/uL (ref 0.1–1.0)
MONOS PCT: 8 % (ref 3–12)
Neutro Abs: 4.7 10*3/uL (ref 1.7–7.7)
Neutrophils Relative %: 58 % (ref 43–77)
Platelets: 260 10*3/uL (ref 150–400)
RBC: 5.23 MIL/uL — ABNORMAL HIGH (ref 3.87–5.11)
RDW: 14.6 % (ref 11.5–15.5)
WBC: 8.1 10*3/uL (ref 4.0–10.5)

## 2013-07-24 LAB — HEMOGLOBIN A1C
Hgb A1c MFr Bld: 6.5 % — ABNORMAL HIGH (ref <5.7)
MEAN PLASMA GLUCOSE: 140 mg/dL — AB (ref <117)

## 2013-07-24 NOTE — Progress Notes (Signed)
Subjective:   Destiny Hughes is a 68 y.o. female who presents for Medicare Annual Wellness Visit and 3 month follow up on hypertension, prediabetes, hyperlipidemia, vitamin D def.  Date of last medicare wellness visit is unknown.   Her blood pressure has been controlled at home, today their BP is   She does not workout. She denies chest pain but has SOB with exertion. She is not on cholesterol medication and denies myalgias. Her cholesterol is not at goal. The cholesterol last visit was:   Lab Results  Component Value Date   CHOL 231* 04/15/2013   HDL 45 04/15/2013   LDLCALC 125* 04/15/2013   TRIG 307* 04/15/2013   CHOLHDL 5.1 04/15/2013   She has been working on diet and exercise for prediabetes, and denies polydipsia and polyuria. Last A1C in the office was:  Lab Results  Component Value Date   HGBA1C 6.0* 04/15/2013   Patient is on Vitamin D supplement. Frequent falls, fell back 1-2 months ago and landed on right thumb and developed bump on MCP and would like an xray Names of Other Physician/Practitioners you currently use: 1. [facility]- here for primary care 2. [facility], eye doctor, last visit 1 month ago, + change in vision due to cataracts 3. , dentist, declines 4. Dr. Hardin Negus for pain managment Patient Care Team: Unk Pinto, MD as PCP - General (Internal Medicine) Mauri Pole, MD as Consulting Physician (Orthopedic Surgery) Darlin Coco, MD as Consulting Physician (Cardiology) Garlan Fair, MD as Consulting Physician (Gastroenterology)  Medication Review Current Outpatient Prescriptions on File Prior to Visit  Medication Sig Dispense Refill  . Biotin (BIOTIN 5000) 5 MG CAPS Take 1 capsule by mouth daily.        . Cholecalciferol (VITAMIN D PO) Take 1,000 mg by mouth daily. Taking 1000 daily      . clidinium-chlordiazePOXIDE (LIBRAX) 5-2.5 MG per capsule Take 1 capsule by mouth 2 (two) times daily.  60  capsule  2  . diazepam (VALIUM) 5 MG tablet Take 5 mg by mouth every 6 (six) hours as needed (for muscle spams).       . digoxin (LANOXIN) 0.25 MG tablet Take 250 mcg by mouth every morning.       . docusate sodium (COLACE) 100 MG capsule Take 100 mg by mouth daily as needed for constipation.      Marland Kitchen Fexofenadine HCl (MUCINEX ALLERGY PO) Take 300 mg by mouth. Takes 2 daily      . levothyroxine (SYNTHROID, LEVOTHROID) 150 MCG tablet Take 150 mcg by mouth daily before breakfast.      . methadone (DOLOPHINE) 5 MG tablet Take 5 mg by mouth every 6 (six) hours.       . metoprolol (LOPRESSOR) 100 MG tablet 1/2 to 1 tab BID for BP  60 tablet  11  . oxyCODONE (ROXICODONE) 15 MG immediate release tablet Take 15 mg by mouth every 6 (six) hours as needed for pain.      . promethazine (PHENERGAN) 25 MG tablet Take 25 mg by mouth every 6 (six) hours as needed for nausea.       . propranolol (INDERAL) 80 MG tablet Take 80 mg by mouth 2 (two) times daily.       . sertraline (ZOLOFT) 100 MG tablet Take 150 mg by mouth every morning.       . theophylline (THEO-24) 200 MG 24 hr capsule Take 200 mg by mouth 2 (two) times  daily as needed (wheezing).       . trolamine salicylate (ASPERCREME) 10 % cream Apply 1 application topically daily as needed (for pain). Apply to painful areas on body      . [DISCONTINUED] ferrous sulfate 325 (65 FE) MG tablet Take 1 tablet (325 mg total) by mouth 3 (three) times daily after meals.      . [DISCONTINUED] rivaroxaban (XARELTO) 10 MG TABS tablet Take 1 tablet (10 mg total) by mouth daily.  12 tablet     No current facility-administered medications on file prior to visit.    Current Problems (verified) Patient Active Problem List   Diagnosis Date Noted  . Hypertension 04/15/2013  . Vitamin D Deficiency 04/15/2013  . Encounter for long-term (current) use of other medications 04/15/2013  . Other abnormal glucose 04/15/2013  . Osteoarthritis 01/31/2011  . Palpitation   .  Tachycardia   . Dyspnea   . Hyperlipemia   . Glaucoma   . Chronic neck pain   . Chronic back pain     Screening Tests Health Maintenance  Topic Date Due  . Colonoscopy  09/13/1993  . Zostavax  09/14/2003  . Mammogram  09/17/2008  . Influenza Vaccine  10/19/2013  . Tetanus/tdap  03/21/2020  . Pneumococcal Polysaccharide Vaccine Age 85 And Over  Completed     Immunization History  Administered Date(s) Administered  . Pneumococcal Polysaccharide-23 10/18/2011  . Td 03/21/2010    Preventative care: Last colonoscopy: 07/2012 Dr. Wynetta Emery Last mammogram: states she is suppose to be getting an U/S Last pap smear/pelvic exam: declines DEXA: long time ago ABI and Carotids normal 2012 CXR 2012 Cardiolite/echo normal 2012, mild AS  Prior vaccinations: TD or Tdap: 2012  Influenza: declines Pneumococcal: 2013 Shingles/Zostavax: declines  History reviewed: allergies, current medications, past family history, past medical history, past social history, past surgical history and problem list  Risk Factors: Osteoporosis: postmenopausal estrogen deficiency and dietary calcium and/or vitamin D deficiency History of fracture in the past year: no  Tobacco History  Substance Use Topics  . Smoking status: Current Every Day Smoker -- 0.50 packs/day for 50 years    Types: Cigarettes  . Smokeless tobacco: Never Used  . Alcohol Use: No   She does smoke.  Patient is not a former smoker. Are there smokers in your home (other than you)?  yes  Alcohol Current alcohol use: none  Caffeine Current caffeine use: coffee 1-2 /day  Exercise Exercise limitations: The patient is experiencing exercise intolerance (due to back pain). Current exercise: none  Nutrition/Diet Current diet: in general, a "healthy" diet    Cardiac risk factors: advanced age (older than 57 for men, 50 for women), diabetes mellitus, dyslipidemia, family history of premature cardiovascular disease, hypertension,  obesity (BMI >= 30 kg/m2), sedentary lifestyle and smoking/ tobacco exposure.  Depression Screen- states yes due to chronic pain (Note: if answer to either of the following is "Yes", a more complete depression screening is indicated)   Q1: Over the past two weeks, have you felt down, depressed or hopeless? Yes  Q2: Over the past two weeks, have you felt little interest or pleasure in doing things? Yes  Have you lost interest or pleasure in daily life? Yes  Do you often feel hopeless? No  Do you cry easily over simple problems? No  Activities of Daily Living In your present state of health, do you have any difficulty performing the following activities?:  Driving? Yes at night due to cataracts Managing money?  No  Feeding yourself? No Getting from bed to chair? No Climbing a flight of stairs? Yes due to pain Preparing food and eating?: No Bathing or showering? No Getting dressed: No Getting to the toilet? No Using the toilet:No Moving around from place to place: Yes In the past year have you fallen or had a near fall?:Yes   Are you sexually active?  No  Do you have more than one partner?  No  Vision Difficulties: Yes  Hearing Difficulties: No Do you often ask people to speak up or repeat themselves? No Do you experience ringing or noises in your ears? No Do you have difficulty understanding soft or whispered voices? No  Cognition  Do you feel that you have a problem with memory?Yes  Do you often misplace items? No  Do you feel safe at home?  Yes  Advanced directives Does patient have a [place]? Yes Does patient have a Living Will? Yes   Objective:   Pulse 56, temperature 98.1 F (36.7 C), resp. rate 16, weight 155 lb (70.308 kg). Body mass index is 25.03 kg/(m^2).  General appearance: alert, no distress, WD/WN,  female Cognitive Testing  Alert? Yes  Normal Appearance?Yes  Oriented to person? Yes  Place? Yes   Time? Yes  Recall of three  objects?  Yes  Can perform simple calculations? Yes  Displays appropriate judgment?Yes  Can read the correct time from a watch face?Yes  Appears well nourished - in no distress.  Eyes: PERRLA, EOMs, conjunctiva no swelling or erythema.  Sinuses: No frontal/maxillary tenderness  ENT/Mouth: EAC's clear, TM's nl w/o erythema, bulging. Nares clear w/o erythema, swelling, exudates. Oropharynx clear without erythema or exudates. Oral hygiene is good. Tongue normal, non obstructing. Hearing intact.  Neck: Supple. Thyroid nl. Car 2+/2+ without bruits, nodes or JVD.  Chest: Respirations decreased diffusely with minor RLL wheezing without rales, rhonchi, or stridor.  Cor: Heart sounds normal w/ regular rate and rhythm without sig. murmurs, gallops, clicks, or rubs. Peripheral pulses normal and equal with 1 + edema.  Abdomen: Soft & bowel sounds normal. Non-tender w/o guarding, rebound, masses, or organomegaly. Right lower abdominal hernia, retractable Lymphatics: Unremarkable.  Musculoskeletal: Difficult exam, 3/5 diffuse weakness and tender "everywhere" Gait is slow and unsteady being stabilized with a cane.  Skin: Warm, dry without exposed rashes, lesions, ecchymosis apparent.  Neuro: Cranial nerves intact, reflexes equal bilaterally. Sensory-motor testing grossly intact. Tendon reflexes grossly intact.  Pysch: Alert & oriented x 3. Insight and judgement nl & appropriate. No ideations. Breast: defer Gyn: defer Rectal: defer   Assessment:   1. Hypertension - CBC with Differential - BASIC METABOLIC PANEL WITH GFR - Hepatic function panel - TSH  2. Hyperlipemia Refuses any medications despite risk - Lipid panel  3. Glaucoma Get eye exam  4. Vitamin D Deficiency Get on 10,000 Vitamin D a day  5. Encounter for long-term (current) use of other medications - Magnesium  6. Other abnormal glucose Discussed general issues about diabetes pathophysiology and management., Educational material  distributed., Suggested low cholesterol diet., Encouraged aerobic exercise., Discussed foot care., Reminded to get yearly retinal exam. - Hemoglobin A1c - Insulin, fasting  7. Pain of right thumb - DG Hand Complete Right; Future  8. Estrogen deficiency - DG Bone Density; Future  9. High fall risk Refuses PT/home assessment, continue pain management  10. Smoking cessation/wheezing Getting CXR at [facility] long per Dr. Hardin Negus, will get results Declines any medication/smoking aid, understands risk  11. Depression Continue  zoloft, discussed adding wellbutrin or increasing zoloft and patient declines   Plan:   During the course of the visit the patient was educated and counseled about appropriate screening and preventive services including:    Pneumococcal vaccine   Influenza vaccine  Td vaccine  Screening electrocardiogram  Screening mammography  Bone densitometry screening  Colorectal cancer screening  Diabetes screening  Glaucoma screening  Nutrition counseling   Smoking cessation counseling  Screening recommendations, referrals:  Vaccinations: Tdap vaccine not indicated Influenza vaccine declined Pneumococcal vaccine not indicated Shingles vaccine declined Hep B vaccine not indicated  Nutrition assessed and recommended  Colonoscopy uptodate Mammogram declined Pap smear not indicated Pelvic exam not indicated Recommended yearly ophthalmology/optometry visit for glaucoma screening and checkup Recommended yearly dental visit for hygiene and checkup Advanced directives - requested  Conditions/risks identified: BMI: Discussed weight loss, diet, and increase physical activity.  Increase physical activity: AHA recommends 150 minutes of physical activity a week.  Medications reviewed DEXA- ordered Diabetes is at goal,  Urinary Incontinence is an issue: discussed non pharmacology and pharmacology options.  Fall risk: high- discussed PT, home fall  assessment, medications.   Medicare Attestation I have personally reviewed: The patient's medical and social history Their use of alcohol, tobacco or illicit drugs Their current medications and supplements The patient's functional ability including ADLs,fall risks, home safety risks, cognitive, and hearing and visual impairment Diet and physical activities Evidence for depression or mood disorders  The patient's weight, height, BMI, and visual acuity have been recorded in the chart.  I have made referrals, counseling, and provided education to the patient based on review of the above and I have provided the patient with a written personalized care plan for preventive services.     Quentin Mullingmanda Gaynor Ferreras, PA-C   07/24/2013

## 2013-07-24 NOTE — Patient Instructions (Signed)

## 2013-07-25 LAB — BASIC METABOLIC PANEL WITH GFR
BUN: 19 mg/dL (ref 6–23)
CALCIUM: 9.3 mg/dL (ref 8.4–10.5)
CO2: 26 mEq/L (ref 19–32)
CREATININE: 1.11 mg/dL — AB (ref 0.50–1.10)
Chloride: 98 mEq/L (ref 96–112)
GFR, EST AFRICAN AMERICAN: 59 mL/min — AB
GFR, EST NON AFRICAN AMERICAN: 51 mL/min — AB
GLUCOSE: 87 mg/dL (ref 70–99)
Potassium: 4.2 mEq/L (ref 3.5–5.3)
Sodium: 134 mEq/L — ABNORMAL LOW (ref 135–145)

## 2013-07-25 LAB — HEPATIC FUNCTION PANEL
ALBUMIN: 4.1 g/dL (ref 3.5–5.2)
ALT: 13 U/L (ref 0–35)
AST: 13 U/L (ref 0–37)
Alkaline Phosphatase: 73 U/L (ref 39–117)
Bilirubin, Direct: 0.1 mg/dL (ref 0.0–0.3)
Indirect Bilirubin: 0.4 mg/dL (ref 0.2–1.2)
TOTAL PROTEIN: 6.7 g/dL (ref 6.0–8.3)
Total Bilirubin: 0.5 mg/dL (ref 0.2–1.2)

## 2013-07-25 LAB — LIPID PANEL
CHOLESTEROL: 203 mg/dL — AB (ref 0–200)
HDL: 42 mg/dL (ref >39)
LDL CALC: 105 mg/dL — AB (ref 0–99)
TRIGLYCERIDES: 281 mg/dL — AB (ref <150)
Total CHOL/HDL Ratio: 4.8 Ratio
VLDL: 56 mg/dL — ABNORMAL HIGH (ref 0–40)

## 2013-07-25 LAB — TSH: TSH: 5.023 u[IU]/mL — ABNORMAL HIGH (ref 0.350–4.500)

## 2013-07-25 LAB — MAGNESIUM: MAGNESIUM: 1.9 mg/dL (ref 1.5–2.5)

## 2013-07-25 LAB — INSULIN, FASTING: INSULIN FASTING, SERUM: 28 u[IU]/mL (ref 3–28)

## 2013-07-29 ENCOUNTER — Telehealth: Payer: Self-pay | Admitting: *Deleted

## 2013-07-29 MED ORDER — LEVOTHYROXINE SODIUM 150 MCG PO TABS: ORAL_TABLET | ORAL | Status: DC

## 2013-07-29 NOTE — Telephone Encounter (Signed)
LEVOTHYROXINE  NEEDS NEW DIRECTIONS 150 MCG  TAKE 1 QD & 1 &1/2 ON TUES THURS & SAT

## 2013-08-07 DIAGNOSIS — M47812 Spondylosis without myelopathy or radiculopathy, cervical region: Secondary | ICD-10-CM | POA: Diagnosis not present

## 2013-08-07 DIAGNOSIS — M961 Postlaminectomy syndrome, not elsewhere classified: Secondary | ICD-10-CM | POA: Diagnosis not present

## 2013-08-07 DIAGNOSIS — Z79899 Other long term (current) drug therapy: Secondary | ICD-10-CM | POA: Diagnosis not present

## 2013-08-07 DIAGNOSIS — M25569 Pain in unspecified knee: Secondary | ICD-10-CM | POA: Diagnosis not present

## 2013-08-15 DIAGNOSIS — H6120 Impacted cerumen, unspecified ear: Secondary | ICD-10-CM | POA: Diagnosis not present

## 2013-08-28 ENCOUNTER — Ambulatory Visit (INDEPENDENT_AMBULATORY_CARE_PROVIDER_SITE_OTHER): Payer: Medicare Other | Admitting: *Deleted

## 2013-08-28 DIAGNOSIS — E039 Hypothyroidism, unspecified: Secondary | ICD-10-CM | POA: Diagnosis not present

## 2013-08-28 NOTE — Progress Notes (Signed)
Patient ID: Destiny Hughes, female   DOB: [birth year], 68 y.o.   MRN: [id number] Patient presents for 1 month recheck TSH.  Patient taking levothyroxine 150 mcg 1 pill times 5 days and 1.5 pills on Tuesday and Thursday.

## 2013-08-29 DIAGNOSIS — J040 Acute laryngitis: Secondary | ICD-10-CM | POA: Diagnosis not present

## 2013-08-29 LAB — TSH: TSH: 1.959 u[IU]/mL (ref 0.350–4.500)

## 2013-09-02 DIAGNOSIS — M47817 Spondylosis without myelopathy or radiculopathy, lumbosacral region: Secondary | ICD-10-CM | POA: Diagnosis not present

## 2013-09-02 DIAGNOSIS — M47812 Spondylosis without myelopathy or radiculopathy, cervical region: Secondary | ICD-10-CM | POA: Diagnosis not present

## 2013-09-02 DIAGNOSIS — M961 Postlaminectomy syndrome, not elsewhere classified: Secondary | ICD-10-CM | POA: Diagnosis not present

## 2013-09-02 DIAGNOSIS — IMO0002 Reserved for concepts with insufficient information to code with codable children: Secondary | ICD-10-CM | POA: Diagnosis not present

## 2013-09-09 DIAGNOSIS — M531 Cervicobrachial syndrome: Secondary | ICD-10-CM | POA: Diagnosis not present

## 2013-09-09 DIAGNOSIS — M47812 Spondylosis without myelopathy or radiculopathy, cervical region: Secondary | ICD-10-CM | POA: Diagnosis not present

## 2013-09-16 DIAGNOSIS — M47812 Spondylosis without myelopathy or radiculopathy, cervical region: Secondary | ICD-10-CM | POA: Diagnosis not present

## 2013-09-16 DIAGNOSIS — M531 Cervicobrachial syndrome: Secondary | ICD-10-CM | POA: Diagnosis not present

## 2013-09-16 DIAGNOSIS — M961 Postlaminectomy syndrome, not elsewhere classified: Secondary | ICD-10-CM | POA: Diagnosis not present

## 2013-10-09 DIAGNOSIS — G894 Chronic pain syndrome: Secondary | ICD-10-CM | POA: Diagnosis not present

## 2013-10-09 DIAGNOSIS — M961 Postlaminectomy syndrome, not elsewhere classified: Secondary | ICD-10-CM | POA: Diagnosis not present

## 2013-10-09 DIAGNOSIS — M47817 Spondylosis without myelopathy or radiculopathy, lumbosacral region: Secondary | ICD-10-CM | POA: Diagnosis not present

## 2013-10-17 ENCOUNTER — Ambulatory Visit: Payer: Self-pay | Admitting: Internal Medicine

## 2013-10-29 ENCOUNTER — Ambulatory Visit (INDEPENDENT_AMBULATORY_CARE_PROVIDER_SITE_OTHER): Payer: Medicare Other | Admitting: Internal Medicine

## 2013-10-29 ENCOUNTER — Encounter: Payer: Self-pay | Admitting: Internal Medicine

## 2013-10-29 VITALS — BP 140/82 | HR 56 | Temp 97.4°F | Resp 16 | Ht 66.0 in | Wt 156.6 lb

## 2013-10-29 DIAGNOSIS — E559 Vitamin D deficiency, unspecified: Secondary | ICD-10-CM | POA: Diagnosis not present

## 2013-10-29 DIAGNOSIS — Z79899 Other long term (current) drug therapy: Secondary | ICD-10-CM | POA: Diagnosis not present

## 2013-10-29 DIAGNOSIS — I10 Essential (primary) hypertension: Secondary | ICD-10-CM | POA: Diagnosis not present

## 2013-10-29 DIAGNOSIS — R7309 Other abnormal glucose: Secondary | ICD-10-CM | POA: Diagnosis not present

## 2013-10-29 DIAGNOSIS — E785 Hyperlipidemia, unspecified: Secondary | ICD-10-CM | POA: Diagnosis not present

## 2013-10-29 LAB — CBC WITH DIFFERENTIAL/PLATELET
BASOS ABS: 0 10*3/uL (ref 0.0–0.1)
Basophils Relative: 0 % (ref 0–1)
EOS ABS: 0.2 10*3/uL (ref 0.0–0.7)
EOS PCT: 2 % (ref 0–5)
HCT: 44.7 % (ref 36.0–46.0)
Hemoglobin: 15.2 g/dL — ABNORMAL HIGH (ref 12.0–15.0)
LYMPHS PCT: 25 % (ref 12–46)
Lymphs Abs: 2 10*3/uL (ref 0.7–4.0)
MCH: 30.2 pg (ref 26.0–34.0)
MCHC: 34 g/dL (ref 30.0–36.0)
MCV: 88.7 fL (ref 78.0–100.0)
Monocytes Absolute: 0.6 10*3/uL (ref 0.1–1.0)
Monocytes Relative: 8 % (ref 3–12)
NEUTROS PCT: 65 % (ref 43–77)
Neutro Abs: 5.3 10*3/uL (ref 1.7–7.7)
PLATELETS: 249 10*3/uL (ref 150–400)
RBC: 5.04 MIL/uL (ref 3.87–5.11)
RDW: 14.5 % (ref 11.5–15.5)
WBC: 8.1 10*3/uL (ref 4.0–10.5)

## 2013-10-29 LAB — HEMOGLOBIN A1C
HEMOGLOBIN A1C: 6.2 % — AB (ref <5.7)
Mean Plasma Glucose: 131 mg/dL — ABNORMAL HIGH (ref <117)

## 2013-10-29 MED ORDER — PROMETHAZINE HCL 25 MG PO TABS: ORAL_TABLET | ORAL | Status: DC

## 2013-10-29 NOTE — Progress Notes (Signed)
Patient ID: Destiny Hughes, female   DOB: Aug 22, 1945, 68 y.o.   MRN: [id number]   This very nice 68 y.o.MWF presents for 3 month follow up with Hypertension, Hyperlipidemia, Pre-Diabetes and Vitamin D Deficiency. Patient continues f/u at Dr Nicholaus Bloom' pain clinic.   Patient is treated for HTN & BP has been controlled at home. Today's  . Patient denies any cardiac type chest pain, palpitations, dyspnea/orthopnea/PND, dizziness, claudication, or dependent edema.    Hyperlipidemia is controlled with diet & meds. Patient denies myalgias or other med SE's. Last Lipids were : Cholesterol, Total 203*; HDL Cholesterol by NMR 42; LDL (calc) 105*; Triglycerides 281 on 07/24/2013   Also, the patient has history of PreDiabetes and patient denies any symptoms of reactive hypoglycemia, diabetic polys, paresthesias or visual blurring.  Last A1c was 6.5% on 07/24/2013.   Further, Patient has history of Vitamin D Deficiency 31 in 2013  and patient sporadically supplements vitamin D without any suspected side-effects. Last vitamin D was 04/15/2013: Vit D, 25-Hydroxy 34   Medication List   BIOTIN 5000 5 MG Caps  Generic drug:  Biotin  Take 1 capsule by mouth daily.     clidinium-chlordiazePOXIDE 5-2.5 MG per capsule  Commonly known as:  LIBRAX  Take 1 capsule by mouth 2 (two) times daily.     diazepam 5 MG tablet  Commonly known as:  VALIUM  Take 5 mg by mouth every 6 (six) hours as needed (for muscle spams).     digoxin 0.25 MG tablet  Commonly known as:  LANOXIN  Take 250 mcg by mouth every morning.     docusate sodium 100 MG capsule  Commonly known as:  COLACE  Take 100 mg by mouth daily as needed for constipation.     levothyroxine 150 MCG tablet  Commonly known as:  SYNTHROID, LEVOTHROID  Take one pill daily 30-60 mins before food and take one and a half pills on Tuesday/Thursday or take as directed.     methadone 5 MG tablet  Commonly known as:  DOLOPHINE  Take 5 mg by mouth every 6 (six)  hours.     metoprolol 100 MG tablet  Commonly known as:  LOPRESSOR  1/2 to 1 tab BID for BP     MUCINEX ALLERGY PO  Take 300 mg by mouth. Takes 2 daily     oxyCODONE 15 MG immediate release tablet  Commonly known as:  ROXICODONE  Take 15 mg by mouth every 6 (six) hours as needed for pain.     promethazine 25 MG tablet  Commonly known as:  PHENERGAN  Take 25 mg by mouth every 6 (six) hours as needed for nausea.     propranolol 80 MG tablet  Commonly known as:  INDERAL  Take 80 mg by mouth 2 (two) times daily.     sertraline 100 MG tablet  Commonly known as:  ZOLOFT  Take 150 mg by mouth every morning.     theophylline 200 MG 24 hr capsule  Commonly known as:  THEO-24  Take 200 mg by mouth 2 (two) times daily as needed (wheezing).     trolamine salicylate 10 % cream  Commonly known as:  ASPERCREME  Apply 1 application topically daily as needed (for pain). Apply to painful areas on body     VITAMIN D PO  Take 1,000 mg by mouth daily. Taking 1000 daily       Allergies  Allergen Reactions  . Ventolin [Kdc:Albuterol] Anaphylaxis  . Albuterol   .  Codeine Nausea And Vomiting  . Penicillins Swelling  . Ecotrin [Aspirin] Nausea And Vomiting   PMHx:   Past Medical History  Diagnosis Date  . Palpitation   . Tachycardia   . Dyspnea   . Hyperlipemia   . Glaucoma   . Chronic neck pain   . Chronic back pain   . Collapse of right lung   . Hypothyroidism     tx. Levothyroxine  . Blood transfusion     after Thoracic surgery ?'04  . GERD (gastroesophageal reflux disease)     tx. TUMS  . Depression   . COPD (chronic obstructive pulmonary disease)     Hx. Bronchitis occ, 01-25-11 somes issue now taking  Z-pak-started 01-24-11/Scar tissue  present  from previous lung collapse  . Heart murmur   . Headache(784.0) 01-25-11    neck and nerve pain related.  . Arthritis 01-25-11    back and most joints  . Kidney calculi 01-25-11    1964  . Neuromuscular disorder 01-25-11     Pain/nerve stimulator implanted -2 yrs ago.  Marland Kitchen Dysrhythmia     hx.PAT- tx. Inderal   FHx:    Reviewed / unchanged SHx:    Reviewed / unchanged  Systems Review:  Constitutional: Denies fever, chills, wt changes, headaches, insomnia, fatigue, night sweats, change in appetite. Eyes: Denies redness, blurred vision, diplopia, discharge, itchy, watery eyes.  ENT: Denies discharge, congestion, post nasal drip, epistaxis, sore throat, earache, hearing loss, dental pain, tinnitus, vertigo, sinus pain, snoring.  CV: Denies chest pain, palpitations, irregular heartbeat, syncope, dyspnea, diaphoresis, orthopnea, PND, claudication or edema. Respiratory: denies cough, dyspnea, DOE, pleurisy, hoarseness, laryngitis, wheezing.  Gastrointestinal: Denies dysphagia, odynophagia, heartburn, reflux, water brash, abdominal pain or cramps, nausea, vomiting, bloating, diarrhea, constipation, hematemesis, melena, hematochezia  or hemorrhoids. Genitourinary: Denies dysuria, frequency, urgency, nocturia, hesitancy, discharge, hematuria or flank pain. Musculoskeletal: Denies arthralgias, myalgias, stiffness, jt. swelling, pain, limping or strain/sprain.  Skin: Denies pruritus, rash, hives, warts, acne, eczema or change in skin lesion(s). Neuro: No weakness, tremor, incoordination, spasms, paresthesia or pain. Psychiatric: Denies confusion, memory loss or sensory loss. Endo: Denies change in weight, skin or hair change.  Heme/Lymph: No excessive bleeding, bruising or enlarged lymph nodes.  Exam:  There were no vitals taken for this visit.  Appears well nourished and in no distress. Eyes: PERRLA, EOMs, conjunctiva no swelling or erythema. Sinuses: No frontal/maxillary tenderness ENT/Mouth: EAC's clear, TM's nl w/o erythema, bulging. Nares clear w/o erythema, swelling, exudates. Oropharynx clear without erythema or exudates. Oral hygiene is good. Tongue normal, non obstructing. Hearing intact.  Neck: Supple. Thyroid  nl. Car 2+/2+ without bruits, nodes or JVD. Chest: Respirations nl with BS clear & equal w/o rales, rhonchi, wheezing or stridor.  Cor: Heart sounds normal w/ regular rate and rhythm without sig. murmurs, gallops, clicks, or rubs. Peripheral pulses normal and equal  without edema.  Abdomen: Soft & bowel sounds normal. Non-tender w/o guarding, rebound, hernias, masses, or organomegaly.  Lymphatics: Unremarkable.  Musculoskeletal: Full ROM all peripheral extremities, joint stability, 5/5 strength, and normal gait.  Skin: Warm, dry without exposed rashes, lesions or ecchymosis apparent.  Neuro: Cranial nerves intact, reflexes equal bilaterally. Sensory-motor testing grossly intact. Tendon reflexes grossly intact.  Pysch: Alert & oriented x 3. Insight and judgement nl & appropriate. No ideations.  Assessment and Plan:  1. Hypertension - Continue monitor blood pressure at home. Continue diet/meds same.  2. Hyperlipidemia - Continue diet/meds, exercise,& lifestyle modifications. Continue monitor periodic cholesterol/liver & renal  functions   3. Pre-Diabetes - Continue diet, exercise, lifestyle modifications. Monitor appropriate labs.  4. Vitamin D Deficiency - Continue supplementation.  5. Hypothyroid  6. DJD  7. Chronic Pain Syndrome (Neck & Back)  Recommended regular exercise, BP monitoring, weight control, and discussed med and SE's. Recommended labs to assess and monitor clinical status. Further disposition pending results of labs.

## 2013-10-29 NOTE — Patient Instructions (Signed)

## 2013-10-30 LAB — INSULIN, FASTING: Insulin fasting, serum: 19 u[IU]/mL (ref 3–28)

## 2013-10-30 LAB — LIPID PANEL
CHOL/HDL RATIO: 5 ratio
Cholesterol: 206 mg/dL — ABNORMAL HIGH (ref 0–200)
HDL: 41 mg/dL (ref >39)
LDL CALC: 120 mg/dL — AB (ref 0–99)
Triglycerides: 225 mg/dL — ABNORMAL HIGH (ref <150)
VLDL: 45 mg/dL — AB (ref 0–40)

## 2013-10-30 LAB — HEPATIC FUNCTION PANEL
ALBUMIN: 4.4 g/dL (ref 3.5–5.2)
ALT: 14 U/L (ref 0–35)
AST: 15 U/L (ref 0–37)
Alkaline Phosphatase: 60 U/L (ref 39–117)
BILIRUBIN TOTAL: 0.5 mg/dL (ref 0.2–1.2)
Bilirubin, Direct: 0.1 mg/dL (ref 0.0–0.3)
Indirect Bilirubin: 0.4 mg/dL (ref 0.2–1.2)
Total Protein: 6.8 g/dL (ref 6.0–8.3)

## 2013-10-30 LAB — TSH: TSH: 1.643 u[IU]/mL (ref 0.350–4.500)

## 2013-10-30 LAB — BASIC METABOLIC PANEL WITH GFR
BUN: 22 mg/dL (ref 6–23)
CALCIUM: 9.7 mg/dL (ref 8.4–10.5)
CHLORIDE: 100 meq/L (ref 96–112)
CO2: 32 mEq/L (ref 19–32)
CREATININE: 1.13 mg/dL — AB (ref 0.50–1.10)
GFR, EST NON AFRICAN AMERICAN: 49 mL/min — AB
GFR, Est African American: 57 mL/min — ABNORMAL LOW
Glucose, Bld: 90 mg/dL (ref 70–99)
Potassium: 4.5 mEq/L (ref 3.5–5.3)
Sodium: 139 mEq/L (ref 135–145)

## 2013-10-30 LAB — MAGNESIUM: MAGNESIUM: 1.7 mg/dL (ref 1.5–2.5)

## 2013-10-30 LAB — VITAMIN D 25 HYDROXY (VIT D DEFICIENCY, FRACTURES): Vit D, 25-Hydroxy: 55 ng/mL (ref 30–89)

## 2013-11-06 DIAGNOSIS — M961 Postlaminectomy syndrome, not elsewhere classified: Secondary | ICD-10-CM | POA: Diagnosis not present

## 2013-11-06 DIAGNOSIS — M47817 Spondylosis without myelopathy or radiculopathy, lumbosacral region: Secondary | ICD-10-CM | POA: Diagnosis not present

## 2013-11-06 DIAGNOSIS — M25569 Pain in unspecified knee: Secondary | ICD-10-CM | POA: Diagnosis not present

## 2013-11-11 ENCOUNTER — Telehealth: Payer: Self-pay | Admitting: *Deleted

## 2013-11-11 MED ORDER — CILIDINIUM-CHLORDIAZEPOXIDE 2.5-5 MG PO CAPS: 1.0000 | ORAL_CAPSULE | Freq: Two times a day (BID) | ORAL | Status: DC

## 2013-11-11 MED ORDER — DIGOXIN 250 MCG PO TABS: 250.0000 ug | ORAL_TABLET | Freq: Every morning | ORAL | Status: DC

## 2013-11-11 NOTE — Telephone Encounter (Signed)
NEW PHARM=  REFILLS:    DIGOXIN POXIDE 5/2.5MG 

## 2013-11-22 ENCOUNTER — Other Ambulatory Visit: Payer: Self-pay | Admitting: Physician Assistant

## 2013-11-26 ENCOUNTER — Other Ambulatory Visit: Payer: Self-pay | Admitting: Physician Assistant

## 2013-12-04 DIAGNOSIS — M5412 Radiculopathy, cervical region: Secondary | ICD-10-CM | POA: Diagnosis not present

## 2013-12-04 DIAGNOSIS — M961 Postlaminectomy syndrome, not elsewhere classified: Secondary | ICD-10-CM | POA: Diagnosis not present

## 2013-12-04 DIAGNOSIS — M47817 Spondylosis without myelopathy or radiculopathy, lumbosacral region: Secondary | ICD-10-CM | POA: Diagnosis not present

## 2013-12-08 ENCOUNTER — Other Ambulatory Visit: Payer: Self-pay | Admitting: Internal Medicine

## 2013-12-18 DIAGNOSIS — M25469 Effusion, unspecified knee: Secondary | ICD-10-CM | POA: Diagnosis not present

## 2013-12-18 DIAGNOSIS — M76899 Other specified enthesopathies of unspecified lower limb, excluding foot: Secondary | ICD-10-CM | POA: Diagnosis not present

## 2013-12-18 DIAGNOSIS — Z96659 Presence of unspecified artificial knee joint: Secondary | ICD-10-CM | POA: Diagnosis not present

## 2013-12-18 DIAGNOSIS — M25569 Pain in unspecified knee: Secondary | ICD-10-CM | POA: Diagnosis not present

## 2013-12-27 DIAGNOSIS — H2512 Age-related nuclear cataract, left eye: Secondary | ICD-10-CM | POA: Diagnosis not present

## 2013-12-27 DIAGNOSIS — H40003 Preglaucoma, unspecified, bilateral: Secondary | ICD-10-CM | POA: Diagnosis not present

## 2013-12-31 DIAGNOSIS — L570 Actinic keratosis: Secondary | ICD-10-CM | POA: Diagnosis not present

## 2014-01-08 DIAGNOSIS — G894 Chronic pain syndrome: Secondary | ICD-10-CM | POA: Diagnosis not present

## 2014-01-08 DIAGNOSIS — M25561 Pain in right knee: Secondary | ICD-10-CM | POA: Diagnosis not present

## 2014-01-08 DIAGNOSIS — M961 Postlaminectomy syndrome, not elsewhere classified: Secondary | ICD-10-CM | POA: Diagnosis not present

## 2014-01-08 DIAGNOSIS — M5412 Radiculopathy, cervical region: Secondary | ICD-10-CM | POA: Diagnosis not present

## 2014-01-28 ENCOUNTER — Other Ambulatory Visit: Payer: Self-pay | Admitting: Internal Medicine

## 2014-01-30 ENCOUNTER — Ambulatory Visit: Payer: Self-pay | Admitting: Physician Assistant

## 2014-01-30 DIAGNOSIS — M961 Postlaminectomy syndrome, not elsewhere classified: Secondary | ICD-10-CM | POA: Diagnosis not present

## 2014-01-30 DIAGNOSIS — G894 Chronic pain syndrome: Secondary | ICD-10-CM | POA: Diagnosis not present

## 2014-01-30 DIAGNOSIS — M5412 Radiculopathy, cervical region: Secondary | ICD-10-CM | POA: Diagnosis not present

## 2014-01-30 DIAGNOSIS — M791 Myalgia: Secondary | ICD-10-CM | POA: Diagnosis not present

## 2014-01-31 DIAGNOSIS — Z96651 Presence of right artificial knee joint: Secondary | ICD-10-CM | POA: Diagnosis not present

## 2014-01-31 DIAGNOSIS — M25561 Pain in right knee: Secondary | ICD-10-CM | POA: Diagnosis not present

## 2014-01-31 DIAGNOSIS — M25551 Pain in right hip: Secondary | ICD-10-CM | POA: Diagnosis not present

## 2014-02-10 ENCOUNTER — Ambulatory Visit: Payer: Self-pay | Admitting: Physician Assistant

## 2014-02-11 DIAGNOSIS — M5412 Radiculopathy, cervical region: Secondary | ICD-10-CM | POA: Diagnosis not present

## 2014-02-11 DIAGNOSIS — G894 Chronic pain syndrome: Secondary | ICD-10-CM | POA: Diagnosis not present

## 2014-02-11 DIAGNOSIS — M961 Postlaminectomy syndrome, not elsewhere classified: Secondary | ICD-10-CM | POA: Diagnosis not present

## 2014-02-11 DIAGNOSIS — M791 Myalgia: Secondary | ICD-10-CM | POA: Diagnosis not present

## 2014-02-17 ENCOUNTER — Encounter: Payer: Self-pay | Admitting: Physician Assistant

## 2014-02-17 ENCOUNTER — Ambulatory Visit (INDEPENDENT_AMBULATORY_CARE_PROVIDER_SITE_OTHER): Payer: Medicare Other | Admitting: Physician Assistant

## 2014-02-17 ENCOUNTER — Other Ambulatory Visit (HOSPITAL_COMMUNITY): Payer: Self-pay | Admitting: Anesthesiology

## 2014-02-17 ENCOUNTER — Ambulatory Visit (HOSPITAL_COMMUNITY): Payer: Medicare Other

## 2014-02-17 ENCOUNTER — Ambulatory Visit (HOSPITAL_COMMUNITY)
Admission: RE | Admit: 2014-02-17 | Discharge: 2014-02-17 | Disposition: A | Discharge status: Home / Self Care | Payer: Medicare Other | Source: Ambulatory Visit | Attending: Anesthesiology | Admitting: Anesthesiology

## 2014-02-17 VITALS — BP 122/64 | HR 58 | Temp 98.0°F | Resp 18 | Ht 66.0 in | Wt 161.0 lb

## 2014-02-17 DIAGNOSIS — G8929 Other chronic pain: Secondary | ICD-10-CM

## 2014-02-17 DIAGNOSIS — E785 Hyperlipidemia, unspecified: Secondary | ICD-10-CM

## 2014-02-17 DIAGNOSIS — M533 Sacrococcygeal disorders, not elsewhere classified: Secondary | ICD-10-CM | POA: Insufficient documentation

## 2014-02-17 DIAGNOSIS — R7309 Other abnormal glucose: Secondary | ICD-10-CM | POA: Diagnosis not present

## 2014-02-17 DIAGNOSIS — M5416 Radiculopathy, lumbar region: Secondary | ICD-10-CM | POA: Diagnosis not present

## 2014-02-17 DIAGNOSIS — E559 Vitamin D deficiency, unspecified: Secondary | ICD-10-CM

## 2014-02-17 DIAGNOSIS — R39198 Other difficulties with micturition: Secondary | ICD-10-CM

## 2014-02-17 DIAGNOSIS — I10 Essential (primary) hypertension: Secondary | ICD-10-CM

## 2014-02-17 DIAGNOSIS — E039 Hypothyroidism, unspecified: Secondary | ICD-10-CM

## 2014-02-17 DIAGNOSIS — R7303 Prediabetes: Secondary | ICD-10-CM

## 2014-02-17 DIAGNOSIS — M461 Sacroiliitis, not elsewhere classified: Secondary | ICD-10-CM | POA: Diagnosis not present

## 2014-02-17 DIAGNOSIS — R3989 Other symptoms and signs involving the genitourinary system: Secondary | ICD-10-CM | POA: Diagnosis not present

## 2014-02-17 DIAGNOSIS — Z79899 Other long term (current) drug therapy: Secondary | ICD-10-CM

## 2014-02-17 NOTE — Progress Notes (Addendum)
Assessment and Plan:  1. Essential hypertension Continue Propranolol and Digoxin as prescribed.  Monitor blood pressure at home.  Reminder to go to the ER if any CP, SOB, nausea, dizziness, severe HA, changes vision/speech, left arm numbness and tingling, and jaw pain. - CBC with Differential - BASIC METABOLIC PANEL WITH GFR - Hepatic function panel  2. Prediabetes Please start following recommended diet (low carb, lots of vegetables and white meat) and exercise. Please cut back on ice cream.  Check A1C. - Hemoglobin A1c - Insulin, fasting  3. Hyperlipemia Patient refuses to take cholesterol medication.  Please follow recommended diet and exercise. Check cholesterol.  - Lipid panel  4. Vitamin D deficiency Will check Vitamin D level at next visit.  Continue vitamin D as prescribed.  5. Encounter for long-term (current) use of medications Will monitor kidney and liver function.  6. Hypothyroidism, unspecified hypothyroidism type Continue Levothyroxine 134mcg- 1 tablet daily and 1 and 1/2 on Tuesdays and Thursdays.  Check level. - TSH  7. Difficulty urinating Ordered labs to R/O UTI. - Urinalysis, Routine w reflex microscopic - Urine culture  Patient denied Spiriva medication for wheezing.   Please follow up with Dr. Nicholaus Bloom for pain issues due to pain contract. Please follow up with Dr. Alvan Dame for right knee and right hip issues.   Discussed medication effects and SE's.  Pt agreed to treatment plan. Continue diet and meds as discussed. Further disposition pending results of labs. Please keep your follow up appt on 05/21/14.  HPI 68 y.o. female  presents for 3 month follow up with hypertension, hyperlipidemia, prediabetes and vitamin D.  Patient is also having right knee and right hip pain.  Patient sees Dr. Nicholaus Bloom for pain and Dr. Paralee Cancel for ortho.  Patient smokes 1 ppd and does not want to quit at this time. Patient states she does have "difficulty urinating" and  does not have any urgency or frequency to urination and does not remember when it started.  Her blood pressure has been controlled at home, today their BP is BP: 122/64 mmHg  Patient is on Propranolol and Digoxin daily.  She does not workout. She denies chest pain.   Patient states she does get dizzy sometimes and does have SOB sometimes.  She is not on cholesterol medication and denies myalgias. Her cholesterol is not at goal. Patient refuses cholesterol medication.  The cholesterol last visit was:   Lab Results  Component Value Date   CHOL 206* 10/29/2013   HDL 41 10/29/2013   LDLCALC 120* 10/29/2013   TRIG 225* 10/29/2013   CHOLHDL 5.0 10/29/2013   She has not been working on diet and exercise for prediabetes, and denies increased appetite, polydipsia and polyuria. Last A1C in the office was:  Lab Results  Component Value Date   HGBA1C 6.2* 10/29/2013  She admits to eating ice cream every day.  Patient is on Vitamin D supplement- 5,000 IU daily. Lab Results  Component Value Date   VD25OH 55 10/29/2013     Hypothyroidism- Hot Flashes [ ]  Weight Gain/Loss [ ]  Fatigue [ ]  Difficulty Swallowing [ X] Voice Changes [ X] Palpitations [ ]  Hot/Cold Intolerance [ ]   Skin/Hair Texture Changes Valu.Nieves ] Diarrhea/Constipation [ ]  Tremors [ ]  Depression Valu.Nieves ] Anxiety [ ]   Synthroid/Levothyroxine Dose 150mg - takes 1 tablet daily and 1 and 1/2 on Tuesdays and Thursdays. Takes every morning: YES Takes the medicine by itself: YES  Patient states she fell 3 weeks ago.  She states she fell 3 times but only fell on right knee one time.  She states she thinks she did not hit her head and states "does not think she LOC".  She states she reached for [place] in kitchen and it rolled away and she fell.  She does see Dr. Nicholaus Bloom for pain and Dr. Paralee Cancel for ortho and knee replacement.  She states Dr. Hardin Negus gave her 2 trigger point injections and she last saw him 1 week ago.  She states she  did not mention it to him before, because she was not hurting at the time.  She states the pain radiates from right hip down to the right ankle.  She states her knee replacement was 2 years ago in the right knee.  Told patient that she needs to follow up with Dr. Hardin Negus for any pain issues due to pain contract.  Current Medications:  Current Outpatient Prescriptions on File Prior to Visit  Medication Sig Dispense Refill  . Biotin (BIOTIN 5000) 5 MG CAPS Take 1 capsule by mouth daily.      . Cholecalciferol (VITAMIN D PO) Take 5,000 Units by mouth daily.    . clidinium-chlordiazePOXIDE (LIBRAX) 5-2.5 MG per capsule Take 1 capsule by mouth 2 (two) times daily. 60 capsule 2  . diazepam (VALIUM) 5 MG tablet Take 5 mg by mouth every 6 (six) hours as needed (for muscle spams).     . digoxin (LANOXIN) 0.25 MG tablet Take 1 tablet (250 mcg total) by mouth every morning. 90 tablet 0  . Fexofenadine HCl (MUCINEX ALLERGY PO) Take 300 mg by mouth. Takes 2 daily    . levothyroxine (SYNTHROID, LEVOTHROID) 150 MCG tablet TAKE 1 PILL DAILY 30-60 MINUTES BEFORE FOOD AND TAKE 1 1/2 PILL ON TUESDAY AND THURSDAY (Patient taking differently: Takes 1 pill daily and 1 1/2 pills on Tues and Thurs.) 35 tablet 0  . methadone (DOLOPHINE) 5 MG tablet Take 5 mg by mouth every 6 (six) hours.     Marland Kitchen oxyCODONE (ROXICODONE) 15 MG immediate release tablet Take 15 mg by mouth every 6 (six) hours as needed for pain.    . promethazine (PHENERGAN) 25 MG tablet Take 1 tablet 4 x daily with your pain meds 120 tablet 5  . propranolol (INDERAL) 80 MG tablet TAKE 1 TABLET BY MOUTH TWICE DAILY FOR BLOOD PRESSURE 180 tablet 1  . sertraline (ZOLOFT) 100 MG tablet Take 150 mg by mouth every morning.     . [DISCONTINUED] ferrous sulfate 325 (65 FE) MG tablet Take 1 tablet (325 mg total) by mouth 3 (three) times daily after meals.    . [DISCONTINUED] rivaroxaban (XARELTO) 10 MG TABS tablet Take 1 tablet (10 mg total) by mouth daily. 12 tablet     No current facility-administered medications on file prior to visit.  Mineral Oil and constipation pills OTC.  Medical History:  Past Medical History  Diagnosis Date  . Palpitation   . Tachycardia   . Dyspnea   . Hyperlipemia   . Glaucoma   . Chronic neck pain   . Chronic back pain   . Collapse of right lung   . Hypothyroidism     tx. Levothyroxine  . Blood transfusion     after Thoracic surgery ?'04  . GERD (gastroesophageal reflux disease)     tx. TUMS  . Depression   . COPD (chronic obstructive pulmonary disease)     Hx. Bronchitis occ, 01-25-11 somes issue now taking  Z-pak-started  01-24-11/Scar tissue  present  from previous lung collapse  . Heart murmur   . Headache(784.0) 01-25-11    neck and nerve pain related.  . Arthritis 01-25-11    back and most joints  . Kidney calculi 01-25-11    1964  . Neuromuscular disorder 01-25-11    Pain/nerve stimulator implanted -2 yrs ago.  Marland Kitchen Dysrhythmia     hx.PAT- tx. Inderal   Allergies:  Allergies  Allergen Reactions  . Ventolin [Kdc:Albuterol] Anaphylaxis  . Albuterol   . Codeine Nausea And Vomiting  . Penicillins Swelling  . Ecotrin [Aspirin] Nausea And Vomiting    Review of Systems: [X]  = complains of  [ ]  = denies General: Fatigue [ ]  Fever [ ]  Chills [ ]  Weakness [ ]   Insomnia [ ]  Eyes: Redness [ ]  Blurred vision [ ]  Diplopia [ ]   ENT: Congestion [ ]  Sinus Pain [ ]  Post Nasal Drip [ ]  Sore Throat [ ]  Earache [ ]   Cardiac: Chest pain/pressure [ ]  SOB Valu.Nieves ] Orthopnea [ ]   Palpitations [ ]   Paroxysmal nocturnal dyspnea[ ]  Claudication [ ]  Edema Valu.Nieves ]  Pulmonary: Cough with clear sputum Valu.Nieves ] Ward Givens ]  SOB Valu.Nieves ]  Snoring [ ]   GI: Nausea [ ]  Vomiting[ ]  Dysphagia[ ]  Heartburn[ ]  Abdominal pain [ ]  Constipation Valu.Nieves ]; Diarrhea [ ] ; BRBPR [ ]  Melena[ ]  GU: Hematuria[ ]  Dysuria [ ]  Nocturia[ ]  Urgency [ ]   Hesitancy [ ]  Discharge [ ]  Neuro: Headaches[ ]  Vertigo[ ]  Paresthesias[ ]  Spasm [ ]  Speech changes [ ]  Incoordination [ ]    Ortho: Arthritis [ X] Joint pain Valu.Nieves ] Muscle pain [ ]  Joint swelling [ X] Back Pain [ ]  Skin:  Rash [ ]   Pruritis [ ]  Change in skin lesion [ ]   Psych: Depression[X ] Anxiety[X ] Confusion [ ]  Memory loss [ ]   Heme/Lypmh: Bleeding [ ]  Bruising [ ]  Enlarged lymph nodes [ ]   Endocrine: Visual blurring [ ]  Paresthesia [ ]  Polyuria [ ]  Polydypsea [ ]    Heat/cold intolerance [ ]  Hypoglycemia [ ]   Family history- Review and unchanged Social history- Review and unchanged Physical Exam: BP 122/64 mmHg  Pulse 58  Temp(Src) 98 F (36.7 C) (Temporal)  Resp 18  Ht 5\' 6"  (1.676 m)  Wt 161 lb (73.029 kg)  BMI 26.00 kg/m2  SpO2 94% Wt Readings from Last 3 Encounters:  02/17/14 161 lb (73.029 kg)  10/29/13 156 lb 9.6 oz (71.033 kg)  07/24/13 155 lb (70.308 kg)  Vitals Reviewed. General Appearance: Well nourished, in no apparent distress. Eyes: PERRLA, EOMs, conjunctiva no swelling or erythema Sinuses: No Frontal/maxillary tenderness ENT/Mouth: Ext aud canals clear, TMs without erythema, bulging. No erythema, swelling, or exudate on post pharynx.  Tonsils not swollen or erythematous. Hearing normal.  Neck: Supple, thyroid normal.  Respiratory: Respiratory effort normal.  Mild wheezing in right and left lung base.  No rales or rhonchi or stridor.  Cardio: RRR with no MRGs. Brisk peripheral pulses without edema.  Abdomen: Soft, + normal BS.  Non tender, no guarding or rebound. Lymphatics: Non tender without lymphadenopathy.  Musculoskeletal: Full ROM, except decreased ROM in right knee, 5/5 strength except for right knee 4/5 strength, antalgic gait with cane.  Skin: Warm, dry without rashes, lesions, ecchymosis.  Scar seen on right knee from knee replacement. Neuro: Cranial nerves intact. Normal muscle tone, no cerebellar symptoms. Sensation intact.  Psych: Awake and oriented X 3, normal affect, Insight and  Judgment appropriate.    Davia Smyre, Stephani Police, PA-C 9:41 AM [facility]

## 2014-02-17 NOTE — Patient Instructions (Addendum)
Please call Dr. Hardin Negus and Dr. Alvan Dame for pain control and hip and knee pain. Please follow up on 05/21/14 for physical with Dr. Melford Aase I will message you on MyChart with results.   Continue medications as prescribed. When drinking fluids pinch and hold nose close and swallow, this will help open up eustachian tubes.     GIVE PT FOOD CHOICE LISTS FOR MEAL PLANNING  1)The amount of food you eat is important  -Too much can increase glucose levels and cause you to gain weight (which can  also increase glucose levels.  -Too little can decrease glucose level to unsafe levels (<70) 2)Eat meals and snacks at the same time each day to help your diabetes medication help you.  If you eat at different times each day, then the medication will not be as effective. 3)Do NOT skip meals or eat meals later than usual.  If you skip meals, then your glucose level can go low (<70).  Eating meals later than usual will not help the medication work effectively. 4) Amount of carbohydrates (carbs) per meal  -Breakfast- 30-45 grams of carbs  -Lunch and Dinner- 45-60 grams of carbs  -Snacks- 15-30 grams of carbs 5)Low fat foods have no more than 3 grams of fat per serving.  -Saturated- look for less than 1 gram per serving  -Trans Fat- look for 0 grams per serving 6)Exercise at least 120 minutes per week  Exercise Benefits:   -Lower LDL (Bad cholesterol)   -Lower blood pressure   -Increase HDL (Good cholesterol)   -Strengthen heart, lungs and muscles   -Burn calories and relieve stress   -Sleep better and help you feel better overall  To lower your risk of heart disease, limit your intake of saturated fat and trans fat as much as possible. THE GOOD WHAT IT DOES WHERE IT'S FOUND  MONOUNSATURATED FAT Lowers LDL and maybe raises HDL cholesterol Canola oil, olives, olive oil, peanuts, peanut oil, avocados, nuts  POLYUNSATURATED FAT Lowers LDL cholesterol Corn, safflower, sunflower and soybean oils, nuts, seeds   OMEGA-3 FATTY ACIDS Lowers triglycerides (blood fats) and blood pressure Salmon, mackerel, herring, sardines, flax seed, flaxseed oil, walnuts, soybean oil  THE BAD WHAT IT DOES WHERE IT'S FOUND  SATURATED FAT Raises LDL (bad) cholesterol Butter, shortening, lard, red meat, cheese, whole milk, ice cream, coconut and palm oils  TRANS FAT Raises LDL cholesterol, lowers HDL (good) cholesterol Fried foods, some stick margarines, some cookies and crackers (look for hydrogenated fat on the ingredient list)  CHOLESTEROL FROM FOOD Too much may raise cholesterol levels Meat, poultry, seafood, eggs, milk, cheese, yogurt, butter   Plate Method (How much food of each food group) 1) Fill one half of your plate with nonstarchy vegetables: lettuce, broccoli, green beans, spinach, carrots or peppers. 2) Fill one quarter with protein: chicken, [place], fish, lean meat, eggs or tofu. 3) Fill one quarter with a nutritious carbohydrate food: brown rice, whole-wheat pasta, whole-wheat bread, peas, or corn.  Choose whole-wheat carbs for extra nutrition.  Controlling carbs helps you control your blood glucose. 4) Include a small piece of fruit at each meal, as well as 8 ounces of lowfat milk or yogurt. 5) Add 1-2 teaspoons of heart-healthy fat, such as olive or canola oil, trans fat-free margarine, avocado, nuts or seeds.

## 2014-02-18 DIAGNOSIS — M5481 Occipital neuralgia: Secondary | ICD-10-CM | POA: Diagnosis not present

## 2014-02-18 DIAGNOSIS — M5412 Radiculopathy, cervical region: Secondary | ICD-10-CM | POA: Diagnosis not present

## 2014-02-18 DIAGNOSIS — M791 Myalgia: Secondary | ICD-10-CM | POA: Diagnosis not present

## 2014-02-18 DIAGNOSIS — M961 Postlaminectomy syndrome, not elsewhere classified: Secondary | ICD-10-CM | POA: Diagnosis not present

## 2014-02-18 LAB — HEPATIC FUNCTION PANEL
ALT: 12 U/L (ref 0–35)
AST: 14 U/L (ref 0–37)
Albumin: 4.2 g/dL (ref 3.5–5.2)
Alkaline Phosphatase: 66 U/L (ref 39–117)
BILIRUBIN DIRECT: 0.1 mg/dL (ref 0.0–0.3)
Indirect Bilirubin: 0.4 mg/dL (ref 0.2–1.2)
Total Bilirubin: 0.5 mg/dL (ref 0.2–1.2)
Total Protein: 6.9 g/dL (ref 6.0–8.3)

## 2014-02-18 LAB — CBC WITH DIFFERENTIAL/PLATELET
BASOS ABS: 0 10*3/uL (ref 0.0–0.1)
Basophils Relative: 0 % (ref 0–1)
Eosinophils Absolute: 0.3 10*3/uL (ref 0.0–0.7)
Eosinophils Relative: 3 % (ref 0–5)
HEMATOCRIT: 44.9 % (ref 36.0–46.0)
HEMOGLOBIN: 15 g/dL (ref 12.0–15.0)
LYMPHS PCT: 32 % (ref 12–46)
Lymphs Abs: 2.7 10*3/uL (ref 0.7–4.0)
MCH: 30.3 pg (ref 26.0–34.0)
MCHC: 33.4 g/dL (ref 30.0–36.0)
MCV: 90.7 fL (ref 78.0–100.0)
MONO ABS: 0.7 10*3/uL (ref 0.1–1.0)
MONOS PCT: 8 % (ref 3–12)
MPV: 9.9 fL (ref 9.4–12.4)
NEUTROS ABS: 4.8 10*3/uL (ref 1.7–7.7)
Neutrophils Relative %: 57 % (ref 43–77)
Platelets: 272 10*3/uL (ref 150–400)
RBC: 4.95 MIL/uL (ref 3.87–5.11)
RDW: 15 % (ref 11.5–15.5)
WBC: 8.4 10*3/uL (ref 4.0–10.5)

## 2014-02-18 LAB — URINALYSIS, MICROSCOPIC ONLY
BACTERIA UA: NONE SEEN
Casts: NONE SEEN
Crystals: NONE SEEN
Squamous Epithelial / LPF: NONE SEEN

## 2014-02-18 LAB — URINALYSIS, ROUTINE W REFLEX MICROSCOPIC
BILIRUBIN URINE: NEGATIVE
GLUCOSE, UA: NEGATIVE mg/dL
Hgb urine dipstick: NEGATIVE
KETONES UR: NEGATIVE mg/dL
Nitrite: NEGATIVE
PH: 6.5 (ref 5.0–8.0)
Protein, ur: NEGATIVE mg/dL
Specific Gravity, Urine: 1.011 (ref 1.005–1.030)
Urobilinogen, UA: 0.2 mg/dL (ref 0.0–1.0)

## 2014-02-18 LAB — BASIC METABOLIC PANEL WITH GFR
BUN: 17 mg/dL (ref 6–23)
CALCIUM: 9.3 mg/dL (ref 8.4–10.5)
CO2: 30 meq/L (ref 19–32)
Chloride: 97 mEq/L (ref 96–112)
Creat: 1.02 mg/dL (ref 0.50–1.10)
GFR, EST AFRICAN AMERICAN: 64 mL/min
GFR, Est Non African American: 56 mL/min — ABNORMAL LOW
Glucose, Bld: 79 mg/dL (ref 70–99)
POTASSIUM: 4.4 meq/L (ref 3.5–5.3)
SODIUM: 137 meq/L (ref 135–145)

## 2014-02-18 LAB — HEMOGLOBIN A1C
Hgb A1c MFr Bld: 6.3 % — ABNORMAL HIGH (ref <5.7)
Mean Plasma Glucose: 134 mg/dL — ABNORMAL HIGH (ref <117)

## 2014-02-18 LAB — LIPID PANEL
CHOL/HDL RATIO: 4.7 ratio
CHOLESTEROL: 228 mg/dL — AB (ref 0–200)
HDL: 49 mg/dL (ref >39)
LDL CALC: 140 mg/dL — AB (ref 0–99)
TRIGLYCERIDES: 194 mg/dL — AB (ref <150)
VLDL: 39 mg/dL (ref 0–40)

## 2014-02-18 LAB — TSH: TSH: 5.284 u[IU]/mL — ABNORMAL HIGH (ref 0.350–4.500)

## 2014-02-18 LAB — INSULIN, FASTING: Insulin fasting, serum: 6.9 u[IU]/mL (ref 2.0–19.6)

## 2014-02-19 LAB — URINE CULTURE
COLONY COUNT: NO GROWTH
ORGANISM ID, BACTERIA: NO GROWTH

## 2014-02-25 DIAGNOSIS — M791 Myalgia: Secondary | ICD-10-CM | POA: Diagnosis not present

## 2014-02-25 DIAGNOSIS — M961 Postlaminectomy syndrome, not elsewhere classified: Secondary | ICD-10-CM | POA: Diagnosis not present

## 2014-02-25 DIAGNOSIS — M5412 Radiculopathy, cervical region: Secondary | ICD-10-CM | POA: Diagnosis not present

## 2014-03-11 DIAGNOSIS — M791 Myalgia: Secondary | ICD-10-CM | POA: Diagnosis not present

## 2014-03-11 DIAGNOSIS — M5412 Radiculopathy, cervical region: Secondary | ICD-10-CM | POA: Diagnosis not present

## 2014-03-11 DIAGNOSIS — M961 Postlaminectomy syndrome, not elsewhere classified: Secondary | ICD-10-CM | POA: Diagnosis not present

## 2014-03-31 ENCOUNTER — Other Ambulatory Visit: Payer: Self-pay | Admitting: Physician Assistant

## 2014-03-31 DIAGNOSIS — M25569 Pain in unspecified knee: Secondary | ICD-10-CM | POA: Diagnosis not present

## 2014-03-31 DIAGNOSIS — M5412 Radiculopathy, cervical region: Secondary | ICD-10-CM | POA: Diagnosis not present

## 2014-03-31 DIAGNOSIS — M791 Myalgia: Secondary | ICD-10-CM | POA: Diagnosis not present

## 2014-03-31 DIAGNOSIS — M961 Postlaminectomy syndrome, not elsewhere classified: Secondary | ICD-10-CM | POA: Diagnosis not present

## 2014-04-01 DIAGNOSIS — K589 Irritable bowel syndrome without diarrhea: Secondary | ICD-10-CM | POA: Insufficient documentation

## 2014-04-03 ENCOUNTER — Other Ambulatory Visit: Payer: Self-pay

## 2014-04-03 MED ORDER — HYOSCYAMINE SULFATE 0.125 MG PO TABS: 0.1250 mg | ORAL_TABLET | Freq: Three times a day (TID) | ORAL | Status: DC | PRN

## 2014-04-23 DIAGNOSIS — M961 Postlaminectomy syndrome, not elsewhere classified: Secondary | ICD-10-CM | POA: Diagnosis not present

## 2014-04-23 DIAGNOSIS — M791 Myalgia: Secondary | ICD-10-CM | POA: Diagnosis not present

## 2014-04-23 DIAGNOSIS — M25569 Pain in unspecified knee: Secondary | ICD-10-CM | POA: Diagnosis not present

## 2014-04-23 DIAGNOSIS — M5412 Radiculopathy, cervical region: Secondary | ICD-10-CM | POA: Diagnosis not present

## 2014-04-24 ENCOUNTER — Other Ambulatory Visit (HOSPITAL_COMMUNITY): Payer: Self-pay | Admitting: Anesthesiology

## 2014-04-24 DIAGNOSIS — M25561 Pain in right knee: Secondary | ICD-10-CM

## 2014-04-30 DIAGNOSIS — M961 Postlaminectomy syndrome, not elsewhere classified: Secondary | ICD-10-CM | POA: Diagnosis not present

## 2014-04-30 DIAGNOSIS — M25569 Pain in unspecified knee: Secondary | ICD-10-CM | POA: Diagnosis not present

## 2014-04-30 DIAGNOSIS — M791 Myalgia: Secondary | ICD-10-CM | POA: Diagnosis not present

## 2014-04-30 DIAGNOSIS — M5412 Radiculopathy, cervical region: Secondary | ICD-10-CM | POA: Diagnosis not present

## 2014-05-06 ENCOUNTER — Other Ambulatory Visit: Payer: Self-pay | Admitting: Internal Medicine

## 2014-05-06 ENCOUNTER — Other Ambulatory Visit: Payer: Self-pay | Admitting: Physician Assistant

## 2014-05-08 ENCOUNTER — Encounter: Payer: Self-pay | Admitting: Internal Medicine

## 2014-05-08 ENCOUNTER — Encounter (HOSPITAL_COMMUNITY)
Admission: RE | Admit: 2014-05-08 | Discharge: 2014-05-08 | Disposition: A | Discharge status: Home / Self Care | Payer: Medicare Other | Source: Ambulatory Visit | Attending: Anesthesiology | Admitting: Anesthesiology

## 2014-05-08 DIAGNOSIS — M25561 Pain in right knee: Secondary | ICD-10-CM | POA: Diagnosis not present

## 2014-05-08 DIAGNOSIS — M1712 Unilateral primary osteoarthritis, left knee: Secondary | ICD-10-CM | POA: Diagnosis not present

## 2014-05-08 MED ORDER — TECHNETIUM TC 99M MEDRONATE IV KIT
27.0000 | PACK | Freq: Once | INTRAVENOUS | Status: AC | PRN
  Administered 2014-05-08: 27 via INTRAVENOUS

## 2014-05-19 DIAGNOSIS — M25569 Pain in unspecified knee: Secondary | ICD-10-CM | POA: Diagnosis not present

## 2014-05-19 DIAGNOSIS — M5412 Radiculopathy, cervical region: Secondary | ICD-10-CM | POA: Diagnosis not present

## 2014-05-19 DIAGNOSIS — M791 Myalgia: Secondary | ICD-10-CM | POA: Diagnosis not present

## 2014-05-19 DIAGNOSIS — M961 Postlaminectomy syndrome, not elsewhere classified: Secondary | ICD-10-CM | POA: Diagnosis not present

## 2014-05-21 ENCOUNTER — Ambulatory Visit (INDEPENDENT_AMBULATORY_CARE_PROVIDER_SITE_OTHER): Payer: Medicare Other | Admitting: Internal Medicine

## 2014-05-21 ENCOUNTER — Encounter: Payer: Self-pay | Admitting: Internal Medicine

## 2014-05-21 VITALS — BP 140/82 | HR 68 | Temp 97.5°F | Resp 16 | Ht 66.5 in | Wt 160.0 lb

## 2014-05-21 DIAGNOSIS — I10 Essential (primary) hypertension: Secondary | ICD-10-CM

## 2014-05-21 DIAGNOSIS — Z79899 Other long term (current) drug therapy: Secondary | ICD-10-CM | POA: Diagnosis not present

## 2014-05-21 DIAGNOSIS — M549 Dorsalgia, unspecified: Secondary | ICD-10-CM

## 2014-05-21 DIAGNOSIS — E559 Vitamin D deficiency, unspecified: Secondary | ICD-10-CM | POA: Diagnosis not present

## 2014-05-21 DIAGNOSIS — Z9181 History of falling: Secondary | ICD-10-CM

## 2014-05-21 DIAGNOSIS — Z1331 Encounter for screening for depression: Secondary | ICD-10-CM

## 2014-05-21 DIAGNOSIS — E785 Hyperlipidemia, unspecified: Secondary | ICD-10-CM | POA: Diagnosis not present

## 2014-05-21 DIAGNOSIS — R7309 Other abnormal glucose: Secondary | ICD-10-CM

## 2014-05-21 DIAGNOSIS — Z1212 Encounter for screening for malignant neoplasm of rectum: Secondary | ICD-10-CM

## 2014-05-21 DIAGNOSIS — K589 Irritable bowel syndrome without diarrhea: Secondary | ICD-10-CM

## 2014-05-21 DIAGNOSIS — R7303 Prediabetes: Secondary | ICD-10-CM

## 2014-05-21 DIAGNOSIS — G8929 Other chronic pain: Secondary | ICD-10-CM

## 2014-05-21 DIAGNOSIS — M150 Primary generalized (osteo)arthritis: Secondary | ICD-10-CM

## 2014-05-21 DIAGNOSIS — M542 Cervicalgia: Secondary | ICD-10-CM

## 2014-05-21 DIAGNOSIS — M159 Polyosteoarthritis, unspecified: Secondary | ICD-10-CM

## 2014-05-21 LAB — CBC WITH DIFFERENTIAL/PLATELET
BASOS PCT: 0 % (ref 0–1)
Basophils Absolute: 0 10*3/uL (ref 0.0–0.1)
Eosinophils Absolute: 0.2 10*3/uL (ref 0.0–0.7)
Eosinophils Relative: 3 % (ref 0–5)
HCT: 45.6 % (ref 36.0–46.0)
Hemoglobin: 15.2 g/dL — ABNORMAL HIGH (ref 12.0–15.0)
Lymphocytes Relative: 26 % (ref 12–46)
Lymphs Abs: 1.8 10*3/uL (ref 0.7–4.0)
MCH: 30.2 pg (ref 26.0–34.0)
MCHC: 33.3 g/dL (ref 30.0–36.0)
MCV: 90.7 fL (ref 78.0–100.0)
MONO ABS: 0.6 10*3/uL (ref 0.1–1.0)
MONOS PCT: 8 % (ref 3–12)
MPV: 9.8 fL (ref 8.6–12.4)
Neutro Abs: 4.5 10*3/uL (ref 1.7–7.7)
Neutrophils Relative %: 63 % (ref 43–77)
Platelets: 270 10*3/uL (ref 150–400)
RBC: 5.03 MIL/uL (ref 3.87–5.11)
RDW: 14.3 % (ref 11.5–15.5)
WBC: 7.1 10*3/uL (ref 4.0–10.5)

## 2014-05-21 LAB — HEMOGLOBIN A1C
Hgb A1c MFr Bld: 6.3 % — ABNORMAL HIGH (ref <5.7)
MEAN PLASMA GLUCOSE: 134 mg/dL — AB (ref <117)

## 2014-05-21 NOTE — Progress Notes (Signed)
Patient ID: Destiny Hughes, female   DOB: 03-Nov-1945, 69 y.o.   MRN: [id number]  Annual Comprehensive Examination  This very nice 69 y.o. MWF presents for complete physical.  Patient has been followed for HTN,   Prediabetes, Hyperlipidemia, and Vitamin D Deficiency. Patient is also followed by Dr Nicholaus Bloom of Pain management and Dr Ellene Route for Chronic LBP.     Patient has labile HTN monitored expectantly over 13+ years.   Patient's BP has been controlled at home and patient denies any cardiac symptoms as chest pain, palpitations, shortness of breath, dizziness or ankle swelling. Patient had a negative Cardiolite in 2011.  Today's BP: 140/82 mmHg    Patient's hyperlipidemia is controlled with diet and medications. Patient denies myalgias or other medication SE's. Last lipids were Total  Chol  228*; HDL 49; LDL   140*; Triglycerides 194 on 02/17/2014.    Patient has prediabetes with A1c 6.0% predating since 2011 and patient denies reactive hypoglycemic symptoms, visual blurring, diabetic polys, or paresthesias. Last A1c was  6.3% on 02/17/2014.   Finally, patient has history of Vitamin D Deficiency and last Vitamin D was  55 on 10/29/2013.  Medication Sig  . Biotin (BIOTIN 5000) 5 MG CAPS Take 1 capsule by mouth daily.    . Cholecalciferol (VITAMIN D PO) Take 5,000 Units by mouth daily.  . diazepam (VALIUM) 5 MG tablet Take 5 mg by mouth every 6 (six) hours as needed (for muscle spams).   . digoxin (LANOXIN) 0.25 MG tablet Take 1 tablet (250 mcg total) by mouth every morning.  Marland Kitchen levothyroxine 150 MCG tablet TAKE 1 TAB EVERY DAY  AND  1.5 TAB ON TUESDAY THURSDAY  . methadone (DOLOPHINE) 5 MG tablet Take 5 mg by mouth every 6 (six) hours.   . promethazine (PHENERGAN) 25 MG tablet Take 1 tablet 4 x daily with your pain meds  . propranolol (INDERAL) 80 MG tablet TAKE 1 TABLET BY MOUTH TWICE DAILY FOR BLOOD PRESSURE  . sertraline (ZOLOFT) 100 MG tablet Take 150 mg by mouth every morning.   .  theophylline (THEO-24) 200 MG 24 hr capsule Take 200 mg by mouth 2 (two) times daily.  Marland Kitchen Fexofenadine HCl (MUCINEX ALLERGY PO) Take 300 mg by mouth. Takes 2 daily  . hyoscyamine (LEVSIN) 0.125 MG tablet Take 1 tablet (0.125 mg total) by mouth 3 (three) times daily as needed.  Marland Kitchen oxyCODONE (ROXICODONE) 15 MG immediate release tablet Take 15 mg by mouth every 6 (six) hours as needed for pain.   Allergies  Allergen Reactions  . Ventolin [Kdc:Albuterol] Anaphylaxis  . Albuterol   . Codeine Nausea And Vomiting  . Penicillins Swelling  . Ecotrin [Aspirin] Nausea And Vomiting   Past Medical History  Diagnosis Date  . Palpitation   . Tachycardia   . Dyspnea   . Hyperlipemia   . Glaucoma   . Chronic neck pain   . Chronic back pain   . Collapse of right lung   . Hypothyroidism     tx. Levothyroxine  . Blood transfusion     after Thoracic surgery ?'04  . GERD (gastroesophageal reflux disease)     tx. TUMS  . Depression   . COPD (chronic obstructive pulmonary disease)     Hx. Bronchitis occ, 01-25-11 somes issue now taking  Z-pak-started 01-24-11/Scar tissue  present  from previous lung collapse  . Heart murmur   . Headache(784.0) 01-25-11    neck and nerve pain related.  . Arthritis 01-25-11  back and most joints  . Kidney calculi 01-25-11    1964  . Neuromuscular disorder 01-25-11    Pain/nerve stimulator implanted -2 yrs ago.  Marland Kitchen Dysrhythmia     hx.PAT- tx. Inderal   Health Maintenance  Topic Date Due  . ZOSTAVAX  09/14/2003  . DEXA SCAN  09/13/2008  . MAMMOGRAM  09/17/2008  . INFLUENZA VACCINE  10/19/2013  . TETANUS/TDAP  03/21/2020  . COLONOSCOPY  07/24/2022  . PNEUMOCOCCAL POLYSACCHARIDE VACCINE AGE 44 AND OVER  Completed   Immunization History  Administered Date(s) Administered  . Pneumococcal Polysaccharide-23 10/18/2011  . Td 03/21/2010   Past Surgical History  Procedure Laterality Date  . Spinal cord stimulator implant  2009 APPROX    DR. ELSNER  . Cervical  spine surgery  01-25-11    2005-multiple levels  . Thoracic spine surgery  01-25-11    6'02- then nerve stimulator implanted after  . Tubal ligation    . Appendectomy  01-25-11  . Joint replacement  01-25-11    6'03 LTHA-hemi  . Knee arthroplasty  01-25-11    '04-right, revised x2  . Cholecystectomy  01-25-11    '07  . Carpal tunnel release  01-25-11    Bil.  . Total knee revision  02/01/2011    Procedure: TOTAL KNEE REVISION;  Surgeon: Mauri Pole;  Location: [facility];  Service: Orthopedics;  Laterality: Right;  . Colonoscopy with propofol N/A 07/23/2012    Procedure: COLONOSCOPY WITH PROPOFOL;  Surgeon: Garlan Fair, MD;  Location: [facility];  Service: Endoscopy;  Laterality: N/A;   Family History  Problem Relation Age of Onset  . Heart attack Father   . Aneurysm Mother     CEREBRAL  . Diabetes Son   . Hypertension Son   . Hypertension Son    History  Substance Use Topics  . Smoking status: Current Every Day Smoker -- 0.50 packs/day for 50 years    Types: Cigarettes  . Smokeless tobacco: Never Used  . Alcohol Use: No    ROS Constitutional: Denies fever, chills, weight loss/gain, headaches, insomnia, fatigue, night sweats, and change in appetite. Eyes: Denies redness, blurred vision, diplopia, discharge, itchy, watery eyes.  ENT: Denies discharge, congestion, post nasal drip, epistaxis, sore throat, earache, hearing loss, dental pain, Tinnitus, Vertigo, Sinus pain, snoring.  Cardio: Denies chest pain, palpitations, irregular heartbeat, syncope, dyspnea, diaphoresis, orthopnea, PND, claudication, edema Respiratory: denies cough, dyspnea, DOE, pleurisy, hoarseness, laryngitis, wheezing.  Gastrointestinal: Denies dysphagia, heartburn, reflux, water brash, pain, cramps, nausea, vomiting, bloating, diarrhea, constipation, hematemesis, melena, hematochezia, jaundice, hemorrhoids Genitourinary: Denies dysuria, frequency, urgency, nocturia, hesitancy, discharge, hematuria, flank  pain Breast: Breast lumps, nipple discharge, bleeding.  Musculoskeletal: Denies arthralgia, myalgia, stiffness, Jt. Swelling, pain, limp, and strain/sprain. Denies falls. Skin: Denies puritis, rash, hives, warts, acne, eczema, changing in skin lesion Neuro: No weakness, tremor, incoordination, spasms, paresthesia, pain Psychiatric: Denies confusion, memory loss, sensory loss. Denies Depression. Endocrine: Denies change in weight, skin, hair change, nocturia, and paresthesia, diabetic polys, visual blurring, hyper / hypo glycemic episodes.  Heme/Lymph: No excessive bleeding, bruising, enlarged lymph nodes.  Physical Exam  BP 140/82   Pulse 68  Temp 97.5 F   Resp 16  Ht 5' 6.5"   Wt 160 lb     BMI 25.44   General Appearance: Well nourished and in no apparent distress. Eyes: PERRLA, EOMs, conjunctiva no swelling or erythema, normal fundi and vessels. Sinuses: No frontal/maxillary tenderness ENT/Mouth: EACs patent / TMs  nl. Nares clear  without erythema, swelling, mucoid exudates. Oral hygiene is good. No erythema, swelling, or exudate. Tongue normal, non-obstructing. Tonsils not swollen or erythematous. Hearing normal.  Neck: Supple, thyroid normal. No bruits, nodes or JVD. Respiratory: Respiratory effort normal.  BS equal and clear bilateral without rales, rhonci, wheezing or stridor. Cardio: Heart sounds are normal with regular rate and rhythm and no murmurs, rubs or gallops. Peripheral pulses are normal and equal bilaterally without edema. No aortic or femoral bruits. Chest: symmetric with normal excursions and percussion. Breasts: Symmetric, without lumps, nipple discharge, retractions, or fibrocystic changes.  Abdomen: Flat, soft, with bowl sounds. Nontender, no guarding, rebound, hernias, masses, or organomegaly.  Lymphatics: Non tender without lymphadenopathy.  Genitourinary:  Musculoskeletal: Full ROM all peripheral extremities, joint stability, 5/5 strength, and normal  gait. Skin: Warm and dry without rashes, lesions, cyanosis, clubbing or  ecchymosis.  Neuro: Cranial nerves intact, reflexes equal bilaterally. Normal muscle tone, no cerebellar symptoms. Sensation intact.  Pysch: Awake and oriented X 3, normal affect, Insight and Judgment appropriate.   Assessment and Plan  1. Essential hypertension  - Microalbumin / creatinine urine ratio - EKG 12-Lead - [place], RETROPERITNL ABD,  LTD - TSH  2. Hyperlipemia  - Lipid panel  3. Prediabetes  - Hemoglobin A1c - Insulin, fasting  4. Vitamin D deficiency  - Vit D  25 hydroxy (rtn osteoporosis monitoring)  5. IBS (irritable bowel syndrome)   6. Primary osteoarthritis involving multiple joints   7. Chronic neck pain   8. Chronic back pain   9. Screening for rectal cancer  - POC Hemoccult Bld/Stl (3-Cd Home Screen); Future  10. Depression screen - negative Depression screen  11. At low risk for fall   12. Encounter for long-term (current) use of medications  - Urine Microscopic - CBC with Differential/Platelet - BASIC METABOLIC PANEL WITH GFR - Hepatic function panel - Magnesium   Continue prudent diet as discussed, weight control, BP monitoring, regular exercise, and medications. Discussed med's effects and SE's. Screening labs and tests as requested with regular follow-up as recommended.

## 2014-05-21 NOTE — Patient Instructions (Addendum)
Please see Dr Mare Ferrari   for regular Heart follow Up  ++++++++++++++++++++++++++++++++++++++++++++++  Recommend the book "The END of DIETING" by Dr Excell Seltzer   & the book "The END of DIABETES " by Dr Excell Seltzer  At [email or web address] - get book & Audio CD's      Being diabetic has a  300% increased risk for heart attack, stroke, cancer, and alzheimer- type vascular dementia. It is very important that you work harder with diet by avoiding all foods that are white. Avoid white rice (brown & wild rice is OK), white potatoes (sweetpotatoes in moderation is OK), White bread or wheat bread or anything made out of white flour like bagels, donuts, rolls, buns, biscuits, cakes, pastries, cookies, pizza crust, and pasta (made from white flour & egg whites) - vegetarian pasta or spinach or wheat pasta is OK. Multigrain breads like Arnold's or [employer], or multigrain sandwich thins or flatbreads.  Diet, exercise and weight loss can reverse and cure diabetes in the early stages.  Diet, exercise and weight loss is very important in the control and prevention of complications of diabetes which affects every system in your body, ie. Brain - dementia/stroke, eyes - glaucoma/blindness, heart - heart attack/heart failure, kidneys - dialysis, stomach - gastric paralysis, intestines - malabsorption, nerves - severe painful neuritis, circulation - gangrene & loss of a leg(s), and finally cancer and Alzheimers.    I recommend avoid fried & greasy foods,  sweets/candy, white rice (brown or wild rice or Quinoa is OK), white potatoes (sweet potatoes are OK) - anything made from white flour - bagels, doughnuts, rolls, buns, biscuits,white and wheat breads, pizza crust and traditional pasta made of white flour & egg white(vegetarian pasta or spinach or wheat pasta is OK).  Multi-grain bread is OK - like multi-grain flat bread or sandwich thins. Avoid alcohol in excess. Exercise is also important.    Eat all the  vegetables you want - avoid meat, especially red meat and dairy - especially cheese.  Cheese is the most concentrated form of trans-fats which is the worst thing to clog up our arteries. Veggie cheese is OK which can be found in the fresh produce section at [facility] or [employer] or [employer] ] Preventive Care for Adults A healthy lifestyle and preventive care can promote health and wellness. Preventive health guidelines for women include the following key practices.  A routine yearly physical is a good way to check with your health care provider about your health and preventive screening. It is a chance to share any concerns and updates on your health and to receive a thorough exam.  Visit your dentist for a routine exam and preventive care every 6 months. Brush your teeth twice a day and floss once a day. Good oral hygiene prevents tooth decay and gum disease.  The frequency of eye exams is based on your age, health, family medical history, use of contact lenses, and other factors. Follow your health care provider's recommendations for frequency of eye exams.  Eat a healthy diet. Foods like vegetables, fruits, whole grains, low-fat dairy products, and lean protein foods contain the nutrients you need without too many calories. Decrease your intake of foods high in solid fats, added sugars, and salt. Eat the right amount of calories for you.Get information about a proper diet from your health care provider, if necessary.  Regular physical exercise is one of the most important things you can do for your health. Most adults should get at least  150 minutes of moderate-intensity exercise (any activity that increases your heart rate and causes you to sweat) each week. In addition, most adults need muscle-strengthening exercises on 2 or more days a week.  Maintain a healthy weight. The body mass index (BMI) is a screening tool to identify possible weight problems. It provides an estimate of body fat  based on height and weight. Your health care provider can find your BMI and can help you achieve or maintain a healthy weight.For adults 20 years and older:  A BMI below 18.5 is considered underweight.  A BMI of 18.5 to 24.9 is normal.  A BMI of 25 to 29.9 is considered overweight.  A BMI of 30 and above is considered obese.  Maintain normal blood lipids and cholesterol levels by exercising and minimizing your intake of saturated fat. Eat a balanced diet with plenty of fruit and vegetables. Blood tests for lipids and cholesterol should begin at age 71 and be repeated every 5 years. If your lipid or cholesterol levels are high, you are over 50, or you are at high risk for heart disease, you may need your cholesterol levels checked more frequently.Ongoing high lipid and cholesterol levels should be treated with medicines if diet and exercise are not working.  If you smoke, find out from your health care provider how to quit. If you do not use tobacco, do not start.  Lung cancer screening is recommended for adults aged 13-80 years who are at high risk for developing lung cancer because of a history of smoking. A yearly low-dose CT scan of the lungs is recommended for people who have at least a 30-pack-year history of smoking and are a current smoker or have quit within the past 15 years. A pack year of smoking is smoking an average of 1 pack of cigarettes a day for 1 year (for example: 1 pack a day for 30 years or 2 packs a day for 15 years). Yearly screening should continue until the smoker has stopped smoking for at least 15 years. Yearly screening should be stopped for people who develop a health problem that would prevent them from having lung cancer treatment.  If you are pregnant, do not drink alcohol. If you are breastfeeding, be very cautious about drinking alcohol. If you are not pregnant and choose to drink alcohol, do not have more than 1 drink per day. One drink is considered to be 12  ounces (355 mL) of beer, 5 ounces (148 mL) of wine, or 1.5 ounces (44 mL) of liquor.  Avoid use of street drugs. Do not share needles with anyone. Ask for help if you need support or instructions about stopping the use of drugs.  High blood pressure causes heart disease and increases the risk of stroke. Your blood pressure should be checked at least every 1 to 2 years. Ongoing high blood pressure should be treated with medicines if weight loss and exercise do not work.  If you are 59-24 years old, ask your health care provider if you should take aspirin to prevent strokes.  Diabetes screening involves taking a blood sample to check your fasting blood sugar level. This should be done once every 3 years, after age 22, if you are within normal weight and without risk factors for diabetes. Testing should be considered at a younger age or be carried out more frequently if you are overweight and have at least 1 risk factor for diabetes.  Breast cancer screening is essential preventive care for women.  You should practice "breast self-awareness." This means understanding the normal appearance and feel of your breasts and may include breast self-examination. Any changes detected, no matter how small, should be reported to a health care provider. Women in their 79s and 30s should have a clinical breast exam (CBE) by a health care provider as part of a regular health exam every 1 to 3 years. After age 39, women should have a CBE every year. Starting at age 40, women should consider having a mammogram (breast X-ray test) every year. Women who have a family history of breast cancer should talk to their health care provider about genetic screening. Women at a high risk of breast cancer should talk to their health care providers about having an MRI and a mammogram every year.  Breast cancer gene (BRCA)-related cancer risk assessment is recommended for women who have family members with BRCA-related cancers.  BRCA-related cancers include breast, ovarian, tubal, and peritoneal cancers. Having family members with these cancers may be associated with an increased risk for harmful changes (mutations) in the breast cancer genes BRCA1 and BRCA2. Results of the assessment will determine the need for genetic counseling and BRCA1 and BRCA2 testing.  Routine pelvic exams to screen for cancer are no longer recommended for nonpregnant women who are considered low risk for cancer of the pelvic organs (ovaries, uterus, and vagina) and who do not have symptoms. Ask your health care provider if a screening pelvic exam is right for you.  If you have had past treatment for cervical cancer or a condition that could lead to cancer, you need Pap tests and screening for cancer for at least 20 years after your treatment. If Pap tests have been discontinued, your risk factors (such as having a new sexual partner) need to be reassessed to determine if screening should be resumed. Some women have medical problems that increase the chance of getting cervical cancer. In these cases, your health care provider may recommend more frequent screening and Pap tests.  The HPV test is an additional test that may be used for cervical cancer screening. The HPV test looks for the virus that can cause the cell changes on the cervix. The cells collected during the Pap test can be tested for HPV. The HPV test could be used to screen women aged 34 years and older, and should be used in women of any age who have unclear Pap test results. After the age of 81, women should have HPV testing at the same frequency as a Pap test.  Colorectal cancer can be detected and often prevented. Most routine colorectal cancer screening begins at the age of 44 years and continues through age 101 years. However, your health care provider may recommend screening at an earlier age if you have risk factors for colon cancer. On a yearly basis, your health care provider may  provide home test kits to check for hidden blood in the stool. Use of a small camera at the end of a tube, to directly examine the colon (sigmoidoscopy or colonoscopy), can detect the earliest forms of colorectal cancer. Talk to your health care provider about this at age 61, when routine screening begins. Direct exam of the colon should be repeated every 5-10 years through age 98 years, unless early forms of pre-cancerous polyps or small growths are found.  People who are at an increased risk for hepatitis B should be screened for this virus. You are considered at high risk for hepatitis B if:  You  were born in a country where hepatitis B occurs often. Talk with your health care provider about which countries are considered high risk.  Your parents were born in a high-risk country and you have not received a shot to protect against hepatitis B (hepatitis B vaccine).  You have HIV or AIDS.  You use needles to inject street drugs.  You live with, or have sex with, someone who has hepatitis B.  You get hemodialysis treatment.  You take certain medicines for conditions like cancer, organ transplantation, and autoimmune conditions.  Hepatitis C blood testing is recommended for all people born from [birth year] through 1967 and any individual with known risks for hepatitis C.  Practice safe sex. Use condoms and avoid high-risk sexual practices to reduce the spread of sexually transmitted infections (STIs). STIs include gonorrhea, chlamydia, syphilis, trichomonas, herpes, HPV, and human immunodeficiency virus (HIV). Herpes, HIV, and HPV are viral illnesses that have no cure. They can result in disability, cancer, and death.  You should be screened for sexually transmitted illnesses (STIs) including gonorrhea and chlamydia if:  You are sexually active and are younger than 24 years.  You are older than 24 years and your health care provider tells you that you are at risk for this type of  infection.  Your sexual activity has changed since you were last screened and you are at an increased risk for chlamydia or gonorrhea. Ask your health care provider if you are at risk.  If you are at risk of being infected with HIV, it is recommended that you take a prescription medicine daily to prevent HIV infection. This is called preexposure prophylaxis (PrEP). You are considered at risk if:  You are a heterosexual woman, are sexually active, and are at increased risk for HIV infection.  You take drugs by injection.  You are sexually active with a partner who has HIV.  Talk with your health care provider about whether you are at high risk of being infected with HIV. If you choose to begin PrEP, you should first be tested for HIV. You should then be tested every 3 months for as long as you are taking PrEP.  Osteoporosis is a disease in which the bones lose minerals and strength with aging. This can result in serious bone fractures or breaks. The risk of osteoporosis can be identified using a bone density scan. Women ages 20 years and over and women at risk for fractures or osteoporosis should discuss screening with their health care providers. Ask your health care provider whether you should take a calcium supplement or vitamin D to reduce the rate of osteoporosis.  Menopause can be associated with physical symptoms and risks. Hormone replacement therapy is available to decrease symptoms and risks. You should talk to your health care provider about whether hormone replacement therapy is right for you.  Use sunscreen. Apply sunscreen liberally and repeatedly throughout the day. You should seek shade when your shadow is shorter than you. Protect yourself by wearing long sleeves, pants, a wide-brimmed hat, and sunglasses year round, whenever you are outdoors.  Once a month, do a whole body skin exam, using a mirror to look at the skin on your back. Tell your health care provider of new moles,  moles that have irregular borders, moles that are larger than a pencil eraser, or moles that have changed in shape or color.  Stay current with required vaccines (immunizations).  Influenza vaccine. All adults should be immunized every year.  Tetanus, diphtheria, and  acellular pertussis (Td, Tdap) vaccine. Pregnant women should receive 1 dose of Tdap vaccine during each pregnancy. The dose should be obtained regardless of the length of time since the last dose. Immunization is preferred during the 27th-36th week of gestation. An adult who has not previously received Tdap or who does not know her vaccine status should receive 1 dose of Tdap. This initial dose should be followed by tetanus and diphtheria toxoids (Td) booster doses every 10 years. Adults with an unknown or incomplete history of completing a 3-dose immunization series with Td-containing vaccines should begin or complete a primary immunization series including a Tdap dose. Adults should receive a Td booster every 10 years.  Varicella vaccine. An adult without evidence of immunity to varicella should receive 2 doses or a second dose if she has previously received 1 dose. Pregnant females who do not have evidence of immunity should receive the first dose after pregnancy. This first dose should be obtained before leaving the health care facility. The second dose should be obtained 4-8 weeks after the first dose.  Human papillomavirus (HPV) vaccine. Females aged 13-26 years who have not received the vaccine previously should obtain the 3-dose series. The vaccine is not recommended for use in pregnant females. However, pregnancy testing is not needed before receiving a dose. If a female is found to be pregnant after receiving a dose, no treatment is needed. In that case, the remaining doses should be delayed until after the pregnancy. Immunization is recommended for any person with an immunocompromised condition through the age of 62 years if she  did not get any or all doses earlier. During the 3-dose series, the second dose should be obtained 4-8 weeks after the first dose. The third dose should be obtained 24 weeks after the first dose and 16 weeks after the second dose.  Zoster vaccine. One dose is recommended for adults aged 24 years or older unless certain conditions are present.  Measles, mumps, and rubella (MMR) vaccine. Adults born before [birth year] generally are considered immune to measles and mumps. Adults born in [birth year] or later should have 1 or more doses of MMR vaccine unless there is a contraindication to the vaccine or there is laboratory evidence of immunity to each of the three diseases. A routine second dose of MMR vaccine should be obtained at least 28 days after the first dose for students attending postsecondary schools, health care workers, or international travelers. People who received inactivated measles vaccine or an unknown type of measles vaccine during 1963-1967 should receive 2 doses of MMR vaccine. People who received inactivated mumps vaccine or an unknown type of mumps vaccine before 1979 and are at high risk for mumps infection should consider immunization with 2 doses of MMR vaccine. For females of childbearing age, rubella immunity should be determined. If there is no evidence of immunity, females who are not pregnant should be vaccinated. If there is no evidence of immunity, females who are pregnant should delay immunization until after pregnancy. Unvaccinated health care workers born before [birth year] who lack laboratory evidence of measles, mumps, or rubella immunity or laboratory confirmation of disease should consider measles and mumps immunization with 2 doses of MMR vaccine or rubella immunization with 1 dose of MMR vaccine.  Pneumococcal 13-valent conjugate (PCV13) vaccine. When indicated, a person who is uncertain of her immunization history and has no record of immunization should receive the PCV13 vaccine. An adult  aged 17 years or older who has certain medical conditions and has  not been previously immunized should receive 1 dose of PCV13 vaccine. This PCV13 should be followed with a dose of pneumococcal polysaccharide (PPSV23) vaccine. The PPSV23 vaccine dose should be obtained at least 8 weeks after the dose of PCV13 vaccine. An adult aged 49 years or older who has certain medical conditions and previously received 1 or more doses of PPSV23 vaccine should receive 1 dose of PCV13. The PCV13 vaccine dose should be obtained 1 or more years after the last PPSV23 vaccine dose.  Pneumococcal polysaccharide (PPSV23) vaccine. When PCV13 is also indicated, PCV13 should be obtained first. All adults aged 44 years and older should be immunized. An adult younger than age 71 years who has certain medical conditions should be immunized. Any person who resides in a nursing home or long-term care facility should be immunized. An adult smoker should be immunized. People with an immunocompromised condition and certain other conditions should receive both PCV13 and PPSV23 vaccines. People with human immunodeficiency virus (HIV) infection should be immunized as soon as possible after diagnosis. Immunization during chemotherapy or radiation therapy should be avoided. Routine use of PPSV23 vaccine is not recommended for American Indians, [place] Natives, or people younger than 65 years unless there are medical conditions that require PPSV23 vaccine. When indicated, people who have unknown immunization and have no record of immunization should receive PPSV23 vaccine. One-time revaccination 5 years after the first dose of PPSV23 is recommended for people aged 19-64 years who have chronic kidney failure, nephrotic syndrome, asplenia, or immunocompromised conditions. People who received 1-2 doses of PPSV23 before age 1 years should receive another dose of PPSV23 vaccine at age 1 years or later if at least 5 years have passed since the previous  dose. Doses of PPSV23 are not needed for people immunized with PPSV23 at or after age 9 years.  Meningococcal vaccine. Adults with asplenia or persistent complement component deficiencies should receive 2 doses of quadrivalent meningococcal conjugate (MenACWY-D) vaccine. The doses should be obtained at least 2 months apart. Microbiologists working with certain meningococcal bacteria, [employer] recruits, people at risk during an outbreak, and people who travel to or live in countries with a high rate of meningitis should be immunized. A first-year college student up through age 74 years who is living in a residence hall should receive a dose if she did not receive a dose on or after her 16th birthday. Adults who have certain high-risk conditions should receive one or more doses of vaccine.  Hepatitis A vaccine. Adults who wish to be protected from this disease, have certain high-risk conditions, work with hepatitis A-infected animals, work in hepatitis A research labs, or travel to or work in countries with a high rate of hepatitis A should be immunized. Adults who were previously unvaccinated and who anticipate close contact with an international adoptee during the first 60 days after arrival in the [place] from a country with a high rate of hepatitis A should be immunized.  Hepatitis B vaccine. Adults who wish to be protected from this disease, have certain high-risk conditions, may be exposed to blood or other infectious body fluids, are household contacts or sex partners of hepatitis B positive people, are clients or workers in certain care facilities, or travel to or work in countries with a high rate of hepatitis B should be immunized.  Haemophilus influenzae type b (Hib) vaccine. A previously unvaccinated person with asplenia or sickle cell disease or having a scheduled splenectomy should receive 1 dose of Hib vaccine. Regardless  of previous immunization, a recipient of a hematopoietic stem cell  transplant should receive a 3-dose series 6-12 months after her successful transplant. Hib vaccine is not recommended for adults with HIV infection. Preventive Services / Frequency Ages 55 to 76 years  Blood pressure check.** / Every 1 to 2 years.  Lipid and cholesterol check.** / Every 5 years beginning at age 27.  Clinical breast exam.** / Every 3 years for women in their 75s and 30s.  BRCA-related cancer risk assessment.** / For women who have family members with a BRCA-related cancer (breast, ovarian, tubal, or peritoneal cancers).  Pap test.** / Every 2 years from ages 26 through 76. Every 3 years starting at age 35 through age 20 or 36 with a history of 3 consecutive normal Pap tests.  HPV screening.** / Every 3 years from ages 1 through ages 3 to 44 with a history of 3 consecutive normal Pap tests.  Hepatitis C blood test.** / For any individual with known risks for hepatitis C.  Skin self-exam. / Monthly.  Influenza vaccine. / Every year.  Tetanus, diphtheria, and acellular pertussis (Tdap, Td) vaccine.** / Consult your health care provider. Pregnant women should receive 1 dose of Tdap vaccine during each pregnancy. 1 dose of Td every 10 years.  Varicella vaccine.** / Consult your health care provider. Pregnant females who do not have evidence of immunity should receive the first dose after pregnancy.  HPV vaccine. / 3 doses over 6 months, if 53 and younger. The vaccine is not recommended for use in pregnant females. However, pregnancy testing is not needed before receiving a dose.  Measles, mumps, rubella (MMR) vaccine.** / You need at least 1 dose of MMR if you were born in 1957 or later. You may also need a 2nd dose. For females of childbearing age, rubella immunity should be determined. If there is no evidence of immunity, females who are not pregnant should be vaccinated. If there is no evidence of immunity, females who are pregnant should delay immunization until after  pregnancy.  Pneumococcal 13-valent conjugate (PCV13) vaccine.** / Consult your health care provider.  Pneumococcal polysaccharide (PPSV23) vaccine.** / 1 to 2 doses if you smoke cigarettes or if you have certain conditions.  Meningococcal vaccine.** / 1 dose if you are age 45 to 69 years and a Market researcher living in a residence hall, or have one of several medical conditions, you need to get vaccinated against meningococcal disease. You may also need additional booster doses.  Hepatitis A vaccine.** / Consult your health care provider.  Hepatitis B vaccine.** / Consult your health care provider.  Haemophilus influenzae type b (Hib) vaccine.** / Consult your health care provider. Ages 21 to 45 years  Blood pressure check.** / Every 1 to 2 years.  Lipid and cholesterol check.** / Every 5 years beginning at age 46 years.  Lung cancer screening. / Every year if you are aged 47-80 years and have a 30-pack-year history of smoking and currently smoke or have quit within the past 15 years. Yearly screening is stopped once you have quit smoking for at least 15 years or develop a health problem that would prevent you from having lung cancer treatment.  Clinical breast exam.** / Every year after age 14 years.  BRCA-related cancer risk assessment.** / For women who have family members with a BRCA-related cancer (breast, ovarian, tubal, or peritoneal cancers).  Mammogram.** / Every year beginning at age 62 years and continuing for as long as you are  in good health. Consult with your health care provider.  Pap test.** / Every 3 years starting at age 25 years through age 58 or 49 years with a history of 3 consecutive normal Pap tests.  HPV screening.** / Every 3 years from ages 29 years through ages 77 to 84 years with a history of 3 consecutive normal Pap tests.  Fecal occult blood test (FOBT) of stool. / Every year beginning at age 37 years and continuing until age 2 years. You may  not need to do this test if you get a colonoscopy every 10 years.  Flexible sigmoidoscopy or colonoscopy.** / Every 5 years for a flexible sigmoidoscopy or every 10 years for a colonoscopy beginning at age 6 years and continuing until age 66 years.  Hepatitis C blood test.** / For all people born from [birth year] through 1967 and any individual with known risks for hepatitis C.  Skin self-exam. / Monthly.  Influenza vaccine. / Every year.  Tetanus, diphtheria, and acellular pertussis (Tdap/Td) vaccine.** / Consult your health care provider. Pregnant women should receive 1 dose of Tdap vaccine during each pregnancy. 1 dose of Td every 10 years.  Varicella vaccine.** / Consult your health care provider. Pregnant females who do not have evidence of immunity should receive the first dose after pregnancy.  Zoster vaccine.** / 1 dose for adults aged 17 years or older.  Measles, mumps, rubella (MMR) vaccine.** / You need at least 1 dose of MMR if you were born in 1957 or later. You may also need a 2nd dose. For females of childbearing age, rubella immunity should be determined. If there is no evidence of immunity, females who are not pregnant should be vaccinated. If there is no evidence of immunity, females who are pregnant should delay immunization until after pregnancy.  Pneumococcal 13-valent conjugate (PCV13) vaccine.** / Consult your health care provider.  Pneumococcal polysaccharide (PPSV23) vaccine.** / 1 to 2 doses if you smoke cigarettes or if you have certain conditions.  Meningococcal vaccine.** / Consult your health care provider.  Hepatitis A vaccine.** / Consult your health care provider.  Hepatitis B vaccine.** / Consult your health care provider.  Haemophilus influenzae type b (Hib) vaccine.** / Consult your health care provider. Ages 81 years and over  Blood pressure check.** / Every 1 to 2 years.  Lipid and cholesterol check.** / Every 5 years beginning at age 58 years.  Lung  cancer screening. / Every year if you are aged 49-80 years and have a 30-pack-year history of smoking and currently smoke or have quit within the past 15 years. Yearly screening is stopped once you have quit smoking for at least 15 years or develop a health problem that would prevent you from having lung cancer treatment.  Clinical breast exam.** / Every year after age 66 years.  BRCA-related cancer risk assessment.** / For women who have family members with a BRCA-related cancer (breast, ovarian, tubal, or peritoneal cancers).  Mammogram.** / Every year beginning at age 73 years and continuing for as long as you are in good health. Consult with your health care provider.  Pap test.** / Every 3 years starting at age 85 years through age 10 or 48 years with 3 consecutive normal Pap tests. Testing can be stopped between 65 and 70 years with 3 consecutive normal Pap tests and no abnormal Pap or HPV tests in the past 10 years.  HPV screening.** / Every 3 years from ages 32 years through ages 26 or 78 years  with a history of 3 consecutive normal Pap tests. Testing can be stopped between 65 and 70 years with 3 consecutive normal Pap tests and no abnormal Pap or HPV tests in the past 10 years.  Fecal occult blood test (FOBT) of stool. / Every year beginning at age 30 years and continuing until age 41 years. You may not need to do this test if you get a colonoscopy every 10 years.  Flexible sigmoidoscopy or colonoscopy.** / Every 5 years for a flexible sigmoidoscopy or every 10 years for a colonoscopy beginning at age 7 years and continuing until age 23 years.  Hepatitis C blood test.** / For all people born from [birth year] through 1967 and any individual with known risks for hepatitis C.  Osteoporosis screening.** / A one-time screening for women ages 20 years and over and women at risk for fractures or osteoporosis.  Skin self-exam. / Monthly.  Influenza vaccine. / Every year.  Tetanus, diphtheria, and  acellular pertussis (Tdap/Td) vaccine.** / 1 dose of Td every 10 years.  Varicella vaccine.** / Consult your health care provider.  Zoster vaccine.** / 1 dose for adults aged 64 years or older.  Pneumococcal 13-valent conjugate (PCV13) vaccine.** / Consult your health care provider.  Pneumococcal polysaccharide (PPSV23) vaccine.** / 1 dose for all adults aged 55 years and older.  Meningococcal vaccine.** / Consult your health care provider.  Hepatitis A vaccine.** / Consult your health care provider.  Hepatitis B vaccine.** / Consult your health care provider.  Haemophilus influenzae type b (Hib) vaccine.** / Consult your health care provider.  Preventive Care for Adults A healthy lifestyle and preventive care can promote health and wellness. Preventive health guidelines for women include the following key practices.  A routine yearly physical is a good way to check with your health care provider about your health and preventive screening. It is a chance to share any concerns and updates on your health and to receive a thorough exam.  Visit your dentist for a routine exam and preventive care every 6 months. Brush your teeth twice a day and floss once a day. Good oral hygiene prevents tooth decay and gum disease.  The frequency of eye exams is based on your age, health, family medical history, use of contact lenses, and other factors. Follow your health care provider's recommendations for frequency of eye exams.  Eat a healthy diet. Foods like vegetables, fruits, whole grains, low-fat dairy products, and lean protein foods contain the nutrients you need without too many calories. Decrease your intake of foods high in solid fats, added sugars, and salt. Eat the right amount of calories for you.Get information about a proper diet from your health care provider, if necessary.  Regular physical exercise is one of the most important things you can do for your health. Most adults should get at  least 150 minutes of moderate-intensity exercise (any activity that increases your heart rate and causes you to sweat) each week. In addition, most adults need muscle-strengthening exercises on 2 or more days a week.  Maintain a healthy weight. The body mass index (BMI) is a screening tool to identify possible weight problems. It provides an estimate of body fat based on height and weight. Your health care provider can find your BMI and can help you achieve or maintain a healthy weight.For adults 20 years and older:  A BMI below 18.5 is considered underweight.  A BMI of 18.5 to 24.9 is normal.  A BMI of 25 to 29.9 is  considered overweight.  A BMI of 30 and above is considered obese.  Maintain normal blood lipids and cholesterol levels by exercising and minimizing your intake of saturated fat. Eat a balanced diet with plenty of fruit and vegetables. If your lipid or cholesterol levels are high, you are over 50, or you are at high risk for heart disease, you may need your cholesterol levels checked more frequently.Ongoing high lipid and cholesterol levels should be treated with medicines if diet and exercise are not working.  If you smoke, find out from your health care provider how to quit. If you do not use tobacco, do not start.  Lung cancer screening is recommended for adults aged 109-80 years who are at high risk for developing lung cancer because of a history of smoking. A yearly low-dose CT scan of the lungs is recommended for people who have at least a 30-pack-year history of smoking and are a current smoker or have quit within the past 15 years. A pack year of smoking is smoking an average of 1 pack of cigarettes a day for 1 year (for example: 1 pack a day for 30 years or 2 packs a day for 15 years). Yearly screening should continue until the smoker has stopped smoking for at least 15 years. Yearly screening should be stopped for people who develop a health problem that would prevent them  from having lung cancer treatment.  Avoid use of street drugs. Do not share needles with anyone. Ask for help if you need support or instructions about stopping the use of drugs.  High blood pressure causes heart disease and increases the risk of stroke.  Ongoing high blood pressure should be treated with medicines if weight loss and exercise do not work.  If you are 90-61 years old, ask your health care provider if you should take aspirin to prevent strokes.  Diabetes screening involves taking a blood sample to check your fasting blood sugar level. This should be done once every 3 years, after age 21, if you are within normal weight and without risk factors for diabetes. Testing should be considered at a younger age or be carried out more frequently if you are overweight and have at least 1 risk factor for diabetes.  Breast cancer screening is essential preventive care for women. You should practice "breast self-awareness." This means understanding the normal appearance and feel of your breasts and may include breast self-examination. Any changes detected, no matter how small, should be reported to a health care provider. Women in their 55s and 30s should have a clinical breast exam (CBE) by a health care provider as part of a regular health exam every 1 to 3 years. After age 67, women should have a CBE every year. Starting at age 1, women should consider having a mammogram (breast X-ray test) every year. Women who have a family history of breast cancer should talk to their health care provider about genetic screening. Women at a high risk of breast cancer should talk to their health care providers about having an MRI and a mammogram every year.  Breast cancer gene (BRCA)-related cancer risk assessment is recommended for women who have family members with BRCA-related cancers. BRCA-related cancers include breast, ovarian, tubal, and peritoneal cancers. Having family members with these cancers may be  associated with an increased risk for harmful changes (mutations) in the breast cancer genes BRCA1 and BRCA2. Results of the assessment will determine the need for genetic counseling and BRCA1 and BRCA2 testing.  Routine pelvic exams to screen for cancer are no longer recommended for nonpregnant women who are considered low risk for cancer of the pelvic organs (ovaries, uterus, and vagina) and who do not have symptoms. Ask your health care provider if a screening pelvic exam is right for you.  If you have had past treatment for cervical cancer or a condition that could lead to cancer, you need Pap tests and screening for cancer for at least 20 years after your treatment. If Pap tests have been discontinued, your risk factors (such as having a new sexual partner) need to be reassessed to determine if screening should be resumed. Some women have medical problems that increase the chance of getting cervical cancer. In these cases, your health care provider may recommend more frequent screening and Pap tests.    Colorectal cancer can be detected and often prevented. Most routine colorectal cancer screening begins at the age of 31 years and continues through age 77 years. However, your health care provider may recommend screening at an earlier age if you have risk factors for colon cancer. On a yearly basis, your health care provider may provide home test kits to check for hidden blood in the stool. Use of a small camera at the end of a tube, to directly examine the colon (sigmoidoscopy or colonoscopy), can detect the earliest forms of colorectal cancer. Talk to your health care provider about this at age 51, when routine screening begins. Direct exam of the colon should be repeated every 5-10 years through age 32 years, unless early forms of pre-cancerous polyps or small growths are found.  Osteoporosis is a disease in which the bones lose minerals and strength with aging. This can result in serious bone  fractures or breaks. The risk of osteoporosis can be identified using a bone density scan. Women ages 14 years and over and women at risk for fractures or osteoporosis should discuss screening with their health care providers. Ask your health care provider whether you should take a calcium supplement or vitamin D to reduce the rate of osteoporosis.  Menopause can be associated with physical symptoms and risks. Hormone replacement therapy is available to decrease symptoms and risks. You should talk to your health care provider about whether hormone replacement therapy is right for you.  Use sunscreen. Apply sunscreen liberally and repeatedly throughout the day. You should seek shade when your shadow is shorter than you. Protect yourself by wearing long sleeves, pants, a wide-brimmed hat, and sunglasses year round, whenever you are outdoors.  Once a month, do a whole body skin exam, using a mirror to look at the skin on your back. Tell your health care provider of new moles, moles that have irregular borders, moles that are larger than a pencil eraser, or moles that have changed in shape or color.  Stay current with required vaccines (immunizations).  Influenza vaccine. All adults should be immunized every year.  Tetanus, diphtheria, and acellular pertussis (Td, Tdap) vaccine. Pregnant women should receive 1 dose of Tdap vaccine during each pregnancy. The dose should be obtained regardless of the length of time since the last dose. Immunization is preferred during the 27th-36th week of gestation. An adult who has not previously received Tdap or who does not know her vaccine status should receive 1 dose of Tdap. This initial dose should be followed by tetanus and diphtheria toxoids (Td) booster doses every 10 years. Adults with an unknown or incomplete history of completing a 3-dose immunization series with Td-containing  vaccines should begin or complete a primary immunization series including a Tdap dose.  Adults should receive a Td booster every 10 years.    Zoster vaccine. One dose is recommended for adults aged 78 years or older unless certain conditions are present.    Pneumococcal 13-valent conjugate (PCV13) vaccine. When indicated, a person who is uncertain of her immunization history and has no record of immunization should receive the PCV13 vaccine. An adult aged 71 years or older who has certain medical conditions and has not been previously immunized should receive 1 dose of PCV13 vaccine. This PCV13 should be followed with a dose of pneumococcal polysaccharide (PPSV23) vaccine. The PPSV23 vaccine dose should be obtained at least 8 weeks after the dose of PCV13 vaccine. An adult aged 6 years or older who has certain medical conditions and previously received 1 or more doses of PPSV23 vaccine should receive 1 dose of PCV13. The PCV13 vaccine dose should be obtained 1 or more years after the last PPSV23 vaccine dose.    Pneumococcal polysaccharide (PPSV23) vaccine. When PCV13 is also indicated, PCV13 should be obtained first. All adults aged 23 years and older should be immunized. An adult younger than age 70 years who has certain medical conditions should be immunized. Any person who resides in a nursing home or long-term care facility should be immunized. An adult smoker should be immunized. People with an immunocompromised condition and certain other conditions should receive both PCV13 and PPSV23 vaccines. People with human immunodeficiency virus (HIV) infection should be immunized as soon as possible after diagnosis. Immunization during chemotherapy or radiation therapy should be avoided. Routine use of PPSV23 vaccine is not recommended for American Indians, [place]atives, or people younger than 65 years unless there are medical conditions that require PPSV23 vaccine. When indicated, people who have unknown immunization and have no record of immunization should receive PPSV23 vaccine.  One-time revaccination 5 years after the first dose of PPSV23 is recommended for people aged 19-64 years who have chronic kidney failure, nephrotic syndrome, asplenia, or immunocompromised conditions. People who received 1-2 doses of PPSV23 before age 3 years should receive another dose of PPSV23 vaccine at age 21 years or later if at least 5 years have passed since the previous dose. Doses of PPSV23 are not needed for people immunized with PPSV23 at or after age 37 years.   Preventive Services / Frequency  Ages 62 years and over  Blood pressure check.  Lipid and cholesterol check.  Lung cancer screening. / Every year if you are aged 67-80 years and have a 30-pack-year history of smoking and currently smoke or have quit within the past 15 years. Yearly screening is stopped once you have quit smoking for at least 15 years or develop a health problem that would prevent you from having lung cancer treatment.  Clinical breast exam.** / Every year after age 51 years.  BRCA-related cancer risk assessment.** / For women who have family members with a BRCA-related cancer (breast, ovarian, tubal, or peritoneal cancers).  Mammogram.** / Every year beginning at age 58 years and continuing for as long as you are in good health. Consult with your health care provider.  Pap test.** / Every 3 years starting at age 27 years through age 14 or 68 years with 3 consecutive normal Pap tests. Testing can be stopped between 65 and 70 years with 3 consecutive normal Pap tests and no abnormal Pap or HPV tests in the past 10 years.  Fecal occult blood test (  FOBT) of stool. / Every year beginning at age 60 years and continuing until age 84 years. You may not need to do this test if you get a colonoscopy every 10 years.  Flexible sigmoidoscopy or colonoscopy.** / Every 5 years for a flexible sigmoidoscopy or every 10 years for a colonoscopy beginning at age 60 years and continuing until age 61 years.  Hepatitis C  blood test.** / For all people born from [birth year] through 1967 and any individual with known risks for hepatitis C.  Osteoporosis screening.** / A one-time screening for women ages 67 years and over and women at risk for fractures or osteoporosis.  Skin self-exam. / Monthly.  Influenza vaccine. / Every year.  Tetanus, diphtheria, and acellular pertussis (Tdap/Td) vaccine.** / 1 dose of Td every 10 years.  Zoster vaccine.** / 1 dose for adults aged 38 years or older.  Pneumococcal 13-valent conjugate (PCV13) vaccine.** / Consult your health care provider.  Pneumococcal polysaccharide (PPSV23) vaccine.** / 1 dose for all adults aged 29 years and older. Screening for abdominal aortic aneurysm (AAA)  by ultrasound is recommended for people who have history of high blood pressure or who are current or former smokers.

## 2014-05-22 ENCOUNTER — Ambulatory Visit (HOSPITAL_COMMUNITY)
Admission: RE | Admit: 2014-05-22 | Discharge: 2014-05-22 | Disposition: A | Discharge status: Home / Self Care | Payer: Medicare Other | Source: Ambulatory Visit | Attending: Anesthesiology | Admitting: Anesthesiology

## 2014-05-22 ENCOUNTER — Other Ambulatory Visit (HOSPITAL_COMMUNITY): Payer: Self-pay | Admitting: Anesthesiology

## 2014-05-22 DIAGNOSIS — M25551 Pain in right hip: Secondary | ICD-10-CM

## 2014-05-22 DIAGNOSIS — Z79899 Other long term (current) drug therapy: Secondary | ICD-10-CM | POA: Diagnosis not present

## 2014-05-22 DIAGNOSIS — M1611 Unilateral primary osteoarthritis, right hip: Secondary | ICD-10-CM | POA: Diagnosis not present

## 2014-05-22 DIAGNOSIS — I10 Essential (primary) hypertension: Secondary | ICD-10-CM | POA: Diagnosis not present

## 2014-05-22 LAB — HEPATIC FUNCTION PANEL
ALT: 11 U/L (ref 0–35)
AST: 13 U/L (ref 0–37)
Albumin: 3.9 g/dL (ref 3.5–5.2)
Alkaline Phosphatase: 67 U/L (ref 39–117)
BILIRUBIN DIRECT: 0.1 mg/dL (ref 0.0–0.3)
Indirect Bilirubin: 0.5 mg/dL (ref 0.2–1.2)
TOTAL PROTEIN: 6.6 g/dL (ref 6.0–8.3)
Total Bilirubin: 0.6 mg/dL (ref 0.2–1.2)

## 2014-05-22 LAB — LIPID PANEL
CHOL/HDL RATIO: 5.5 ratio
Cholesterol: 218 mg/dL — ABNORMAL HIGH (ref 0–200)
HDL: 40 mg/dL — ABNORMAL LOW (ref >=46)
LDL Cholesterol: 126 mg/dL — ABNORMAL HIGH (ref 0–99)
TRIGLYCERIDES: 258 mg/dL — AB (ref <150)
VLDL: 52 mg/dL — AB (ref 0–40)

## 2014-05-22 LAB — BASIC METABOLIC PANEL WITH GFR
BUN: 18 mg/dL (ref 6–23)
CHLORIDE: 98 meq/L (ref 96–112)
CO2: 33 mEq/L — ABNORMAL HIGH (ref 19–32)
CREATININE: 0.91 mg/dL (ref 0.50–1.10)
Calcium: 9.3 mg/dL (ref 8.4–10.5)
GFR, EST NON AFRICAN AMERICAN: 64 mL/min
GFR, Est African American: 74 mL/min
Glucose, Bld: 96 mg/dL (ref 70–99)
Potassium: 4.5 mEq/L (ref 3.5–5.3)
Sodium: 135 mEq/L (ref 135–145)

## 2014-05-22 LAB — TSH: TSH: 4.196 u[IU]/mL (ref 0.350–4.500)

## 2014-05-22 LAB — DIGOXIN LEVEL: Digoxin Level: 2.5 ng/mL — ABNORMAL HIGH (ref 0.8–2.0)

## 2014-05-22 LAB — VITAMIN D 25 HYDROXY (VIT D DEFICIENCY, FRACTURES): Vit D, 25-Hydroxy: 35 ng/mL (ref 30–100)

## 2014-05-22 LAB — INSULIN, FASTING: Insulin fasting, serum: 15.5 u[IU]/mL (ref 2.0–19.6)

## 2014-05-22 LAB — MAGNESIUM: Magnesium: 1.9 mg/dL (ref 1.5–2.5)

## 2014-05-23 LAB — URINALYSIS, MICROSCOPIC ONLY
Bacteria, UA: NONE SEEN
Casts: NONE SEEN
Crystals: NONE SEEN
Squamous Epithelial / LPF: NONE SEEN

## 2014-05-23 LAB — MICROALBUMIN / CREATININE URINE RATIO
Creatinine, Urine: 150.1 mg/dL
Microalb Creat Ratio: 2.7 mg/g (ref 0.0–30.0)
Microalb, Ur: 0.4 mg/dL (ref <2.0)

## 2014-05-27 ENCOUNTER — Other Ambulatory Visit: Payer: Self-pay | Admitting: Physician Assistant

## 2014-07-07 DIAGNOSIS — M25569 Pain in unspecified knee: Secondary | ICD-10-CM | POA: Diagnosis not present

## 2014-07-07 DIAGNOSIS — M5412 Radiculopathy, cervical region: Secondary | ICD-10-CM | POA: Diagnosis not present

## 2014-07-07 DIAGNOSIS — M791 Myalgia: Secondary | ICD-10-CM | POA: Diagnosis not present

## 2014-07-07 DIAGNOSIS — M5416 Radiculopathy, lumbar region: Secondary | ICD-10-CM | POA: Diagnosis not present

## 2014-07-22 ENCOUNTER — Ambulatory Visit (INDEPENDENT_AMBULATORY_CARE_PROVIDER_SITE_OTHER): Payer: Medicare Other | Admitting: Internal Medicine

## 2014-07-22 ENCOUNTER — Encounter: Payer: Self-pay | Admitting: Internal Medicine

## 2014-07-22 VITALS — BP 146/72 | HR 72 | Temp 97.5°F | Resp 16 | Ht 66.5 in | Wt 163.0 lb

## 2014-07-22 DIAGNOSIS — M5412 Radiculopathy, cervical region: Secondary | ICD-10-CM | POA: Diagnosis not present

## 2014-07-22 DIAGNOSIS — M791 Myalgia: Secondary | ICD-10-CM | POA: Diagnosis not present

## 2014-07-22 DIAGNOSIS — M25569 Pain in unspecified knee: Secondary | ICD-10-CM | POA: Diagnosis not present

## 2014-07-22 DIAGNOSIS — J320 Chronic maxillary sinusitis: Secondary | ICD-10-CM

## 2014-07-22 DIAGNOSIS — M961 Postlaminectomy syndrome, not elsewhere classified: Secondary | ICD-10-CM | POA: Diagnosis not present

## 2014-07-22 DIAGNOSIS — J0410 Acute tracheitis without obstruction: Secondary | ICD-10-CM

## 2014-07-22 MED ORDER — LEVOFLOXACIN 500 MG PO TABS: ORAL_TABLET | ORAL | Status: DC

## 2014-07-22 MED ORDER — PREDNISONE 20 MG PO TABS: ORAL_TABLET | ORAL | Status: DC

## 2014-07-22 NOTE — Patient Instructions (Signed)
Mucus Relief 400 mg  Take 2 tablets 3 x daily with meals  +++++++++++++++++++++++++++++++  Sinusitis Sinusitis is redness, soreness, and inflammation of the paranasal sinuses. Paranasal sinuses are air pockets within the bones of your face (beneath the eyes, the middle of the forehead, or above the eyes). In healthy paranasal sinuses, mucus is able to drain out, and air is able to circulate through them by way of your nose. However, when your paranasal sinuses are inflamed, mucus and air can become trapped. This can allow bacteria and other germs to grow and cause infection. Sinusitis can develop quickly and last only a short time (acute) or continue over a long period (chronic). Sinusitis that lasts for more than 12 weeks is considered chronic.  CAUSES  Causes of sinusitis include:  Allergies.  Structural abnormalities, such as displacement of the cartilage that separates your nostrils (deviated septum), which can decrease the air flow through your nose and sinuses and affect sinus drainage.  Functional abnormalities, such as when the small hairs (cilia) that line your sinuses and help remove mucus do not work properly or are not present. SIGNS AND SYMPTOMS  Symptoms of acute and chronic sinusitis are the same. The primary symptoms are pain and pressure around the affected sinuses. Other symptoms include:  Upper toothache.  Earache.  Headache.  Bad breath.  Decreased sense of smell and taste.  A cough, which worsens when you are lying flat.  Fatigue.  Fever.  Thick drainage from your nose, which often is green and may contain pus (purulent).  Swelling and warmth over the affected sinuses. DIAGNOSIS  Your health care provider will perform a physical exam. During the exam, your health care provider may:  Look in your nose for signs of abnormal growths in your nostrils (nasal polyps).  Tap over the affected sinus to check for signs of infection.  View the inside of your  sinuses (endoscopy) using an imaging device that has a light attached (endoscope). If your health care provider suspects that you have chronic sinusitis, one or more of the following tests may be recommended:  Allergy tests.  Nasal culture. A sample of mucus is taken from your nose, sent to a lab, and screened for bacteria.  Nasal cytology. A sample of mucus is taken from your nose and examined by your health care provider to determine if your sinusitis is related to an allergy. TREATMENT  Most cases of acute sinusitis are related to a viral infection and will resolve on their own within 10 days. Sometimes medicines are prescribed to help relieve symptoms (pain medicine, decongestants, nasal steroid sprays, or saline sprays).  However, for sinusitis related to a bacterial infection, your health care provider will prescribe antibiotic medicines. These are medicines that will help kill the bacteria causing the infection.  Rarely, sinusitis is caused by a fungal infection. In theses cases, your health care provider will prescribe antifungal medicine. For some cases of chronic sinusitis, surgery is needed. Generally, these are cases in which sinusitis recurs more than 3 times per year, despite other treatments. HOME CARE INSTRUCTIONS   Drink plenty of water. Water helps thin the mucus so your sinuses can drain more easily.  Use a humidifier.  Inhale steam 3 to 4 times a day (for example, sit in the bathroom with the shower running).  Apply a warm, moist washcloth to your face 3 to 4 times a day, or as directed by your health care provider.  Use saline nasal sprays to help moisten  and clean your sinuses.  Take medicines only as directed by your health care provider.  If you were prescribed either an antibiotic or antifungal medicine, finish it all even if you start to feel better. SEEK IMMEDIATE MEDICAL CARE IF:  You have increasing pain or severe headaches.  You have nausea, vomiting, or  drowsiness.  You have swelling around your face.  You have vision problems.  You have a stiff neck.  You have difficulty breathing. MAKE SURE YOU:   Understand these instructions.  Will watch your condition.  Will get help right away if you are not doing well or get worse. Document Released: 03/07/2005 Document Revised: 07/22/2013 Document Reviewed: 03/22/2011 [facility] Patient Information 2015 [place], [place]. This information is not intended to replace advice given to you by your health care provider. Make sure you discuss any questions you have with your health care provider.

## 2014-07-23 ENCOUNTER — Telehealth: Payer: Self-pay | Admitting: *Deleted

## 2014-07-23 NOTE — Telephone Encounter (Signed)
Patient called and states she is concerned about taking the Levaquin RX due to side effects.  Per patient, she already has nerve and muscle weakness and is not ging to take the RX.  Per Dr Melford Aase, he feels Levaquin is the safest and best choice of med for her.  Patient aware.

## 2014-07-24 DIAGNOSIS — J370 Chronic laryngitis: Secondary | ICD-10-CM | POA: Diagnosis not present

## 2014-07-24 DIAGNOSIS — J322 Chronic ethmoidal sinusitis: Secondary | ICD-10-CM | POA: Diagnosis not present

## 2014-07-24 DIAGNOSIS — J410 Simple chronic bronchitis: Secondary | ICD-10-CM | POA: Diagnosis not present

## 2014-07-24 DIAGNOSIS — R490 Dysphonia: Secondary | ICD-10-CM | POA: Diagnosis not present

## 2014-07-24 DIAGNOSIS — J320 Chronic maxillary sinusitis: Secondary | ICD-10-CM | POA: Diagnosis not present

## 2014-07-26 NOTE — Progress Notes (Signed)
Subjective:    Patient ID: Destiny Hughes, female    DOB: Jun 15, 1945, 69 y.o.   MRN: [id number]  HPI  Patient c/o head & chest congestion x 2 weeks with yellowish purulent sinus drainage and sputum. No fever chills or SOB.   Medication Sig  . Biotin (BIOTIN 5000) 5 MG CAPS Take 1 capsule by mouth daily.    . Cholecalciferol (VITAMIN D PO) Take 5,000 Units by mouth daily.  . diazepam (VALIUM) 5 MG tablet Take 5 mg by mouth every 6 (six) hours as needed (for muscle spams).   Marland Kitchen DIGOX 250 MCG tablet TAKE 1 TABLET BY MOUTH EVERY MORNING  . levothyroxine (SYNTHROID, LEVOTHROID) 150 MCG tablet TAKE 1 TABLET BY MOUTH EVERY DAY 30 TO 60 MINUTES BEFORE EATING AND TAKE 1.5 TABLETS BY MOUTH ON TUESDAY THURSDAY  . methadone (DOLOPHINE) 5 MG tablet Take 5 mg by mouth every 6 (six) hours.   . promethazine (PHENERGAN) 25 MG tablet Take 1 tablet 4 x daily with your pain meds  . propranolol (INDERAL) 80 MG tablet TAKE 1 TABLET BY MOUTH TWICE DAILY FOR BLOOD PRESSURE  . sertraline (ZOLOFT) 100 MG tablet Take 150 mg by mouth every morning.   . Oxycodone HCl 20 MG TABS Take by mouth. Takes 20 mg by mouth every 6 hours as needed for pain.  . theophylline (THEO-24) 200 MG 24 hr capsule Take 200 mg by mouth 2 (two) times daily.   Allergies  Allergen Reactions  . Ventolin [Kdc:Albuterol] Anaphylaxis  . Albuterol   . Codeine Nausea And Vomiting  . Penicillins Swelling  . Ecotrin [Aspirin] Nausea And Vomiting   Past Medical History  Diagnosis Date  . Palpitation   . Tachycardia   . Dyspnea   . Hyperlipemia   . Glaucoma   . Chronic neck pain   . Chronic back pain   . Collapse of right lung   . Hypothyroidism     tx. Levothyroxine  . Blood transfusion     after Thoracic surgery ?'04  . GERD (gastroesophageal reflux disease)     tx. TUMS  . Depression   . COPD (chronic obstructive pulmonary disease)     Hx. Bronchitis occ, 01-25-11 somes issue now taking  Z-pak-started 01-24-11/Scar tissue   present  from previous lung collapse  . Heart murmur   . Headache(784.0) 01-25-11    neck and nerve pain related.  . Arthritis 01-25-11    back and most joints  . Kidney calculi 01-25-11    1964  . Neuromuscular disorder 01-25-11    Pain/nerve stimulator implanted -2 yrs ago.  Marland Kitchen Dysrhythmia     hx.PAT- tx. Inderal   Review of Systems 10 point systems review negative except as above.    Objective:   Physical Exam   BP 146/72 mmHg  Pulse 72  Temp(Src) 97.5 F (36.4 C)  Resp 16  Ht 5' 6.5" (1.689 m)  Wt 163 lb (73.936 kg)  BMI 25.92 kg/m2  In no Distress . (+) dry hacking cough. O2 sat 97%  HEENT - Eac's patent. TM's Nl. EOM's full. PERRLA. NasoOroPharynx clear.(+) tender Lt>Rt maxillary areas.  Neck - supple. Nl Thyroid. Carotids 2+ & No bruits, nodes, JVD Chest - Equal BS w/few scattered rales, no rhonchi or wheezes. Cor - Nl HS. RRR w/o sig MGR. PP 1(+). No edema. MS- FROM w/o deformities. Muscle power, tone and bulk Nl. Gait Nl. Neuro - No obvious Cr N abnormalities. Sensory, motor and Cerebellar functions appear  Nl w/o focal abnormalities. Psyche - Mental status normal & appropriate.  No delusions, ideations or obvious mood abnormalities.    Assessment & Plan:   1. Left maxillary sinusitis  2. Tracheitis  - predniSONE (DELTASONE) 20 MG tablet; 1 tab 3 x day for 3 days, then 1 tab 2 x day for 3 days, then 1 tab 1 x day for 5 days  Dispense: 20 tablet; Refill: 0 - Levaquin 500 mh # 15 x 1 rf  - Discussed meds/SE's.

## 2014-07-30 DIAGNOSIS — J320 Chronic maxillary sinusitis: Secondary | ICD-10-CM | POA: Diagnosis not present

## 2014-07-30 DIAGNOSIS — J410 Simple chronic bronchitis: Secondary | ICD-10-CM | POA: Diagnosis not present

## 2014-07-30 DIAGNOSIS — J019 Acute sinusitis, unspecified: Secondary | ICD-10-CM | POA: Diagnosis not present

## 2014-07-30 DIAGNOSIS — J040 Acute laryngitis: Secondary | ICD-10-CM | POA: Diagnosis not present

## 2014-07-30 DIAGNOSIS — J322 Chronic ethmoidal sinusitis: Secondary | ICD-10-CM | POA: Diagnosis not present

## 2014-07-30 DIAGNOSIS — R490 Dysphonia: Secondary | ICD-10-CM | POA: Diagnosis not present

## 2014-08-19 DIAGNOSIS — Z79891 Long term (current) use of opiate analgesic: Secondary | ICD-10-CM | POA: Diagnosis not present

## 2014-09-03 ENCOUNTER — Ambulatory Visit: Payer: Self-pay | Admitting: Internal Medicine

## 2014-09-09 ENCOUNTER — Other Ambulatory Visit: Payer: Self-pay | Admitting: Internal Medicine

## 2014-09-15 ENCOUNTER — Other Ambulatory Visit: Payer: Self-pay

## 2014-09-16 ENCOUNTER — Other Ambulatory Visit: Payer: Self-pay | Admitting: Internal Medicine

## 2014-09-16 DIAGNOSIS — M25569 Pain in unspecified knee: Secondary | ICD-10-CM | POA: Diagnosis not present

## 2014-09-16 DIAGNOSIS — M5412 Radiculopathy, cervical region: Secondary | ICD-10-CM | POA: Diagnosis not present

## 2014-09-16 DIAGNOSIS — I10 Essential (primary) hypertension: Secondary | ICD-10-CM

## 2014-09-16 DIAGNOSIS — M791 Myalgia: Secondary | ICD-10-CM | POA: Diagnosis not present

## 2014-09-16 DIAGNOSIS — M961 Postlaminectomy syndrome, not elsewhere classified: Secondary | ICD-10-CM | POA: Diagnosis not present

## 2014-09-16 MED ORDER — PROPRANOLOL HCL 80 MG PO TABS: ORAL_TABLET | ORAL | Status: DC

## 2014-09-20 ENCOUNTER — Other Ambulatory Visit: Payer: Self-pay | Admitting: Internal Medicine

## 2014-09-20 DIAGNOSIS — E039 Hypothyroidism, unspecified: Secondary | ICD-10-CM

## 2014-10-01 ENCOUNTER — Other Ambulatory Visit: Payer: Self-pay | Admitting: Physician Assistant

## 2014-10-03 ENCOUNTER — Ambulatory Visit: Payer: Self-pay | Admitting: Internal Medicine

## 2014-10-03 ENCOUNTER — Encounter: Payer: Self-pay | Admitting: Physician Assistant

## 2014-10-03 ENCOUNTER — Ambulatory Visit (INDEPENDENT_AMBULATORY_CARE_PROVIDER_SITE_OTHER): Payer: Medicare Other | Admitting: Physician Assistant

## 2014-10-03 VITALS — BP 122/78 | HR 56 | Temp 97.7°F | Resp 16 | Ht 66.5 in | Wt 159.0 lb

## 2014-10-03 DIAGNOSIS — R059 Cough, unspecified: Secondary | ICD-10-CM

## 2014-10-03 DIAGNOSIS — M549 Dorsalgia, unspecified: Secondary | ICD-10-CM

## 2014-10-03 DIAGNOSIS — F17200 Nicotine dependence, unspecified, uncomplicated: Secondary | ICD-10-CM

## 2014-10-03 DIAGNOSIS — I10 Essential (primary) hypertension: Secondary | ICD-10-CM

## 2014-10-03 DIAGNOSIS — Z9181 History of falling: Secondary | ICD-10-CM | POA: Insufficient documentation

## 2014-10-03 DIAGNOSIS — Z0000 Encounter for general adult medical examination without abnormal findings: Secondary | ICD-10-CM

## 2014-10-03 DIAGNOSIS — Z0001 Encounter for general adult medical examination with abnormal findings: Secondary | ICD-10-CM

## 2014-10-03 DIAGNOSIS — Z23 Encounter for immunization: Secondary | ICD-10-CM

## 2014-10-03 DIAGNOSIS — E039 Hypothyroidism, unspecified: Secondary | ICD-10-CM

## 2014-10-03 DIAGNOSIS — R7303 Prediabetes: Secondary | ICD-10-CM

## 2014-10-03 DIAGNOSIS — Z6825 Body mass index (BMI) 25.0-25.9, adult: Secondary | ICD-10-CM

## 2014-10-03 DIAGNOSIS — K589 Irritable bowel syndrome without diarrhea: Secondary | ICD-10-CM

## 2014-10-03 DIAGNOSIS — R6889 Other general symptoms and signs: Secondary | ICD-10-CM | POA: Diagnosis not present

## 2014-10-03 DIAGNOSIS — I341 Nonrheumatic mitral (valve) prolapse: Secondary | ICD-10-CM

## 2014-10-03 DIAGNOSIS — E2839 Other primary ovarian failure: Secondary | ICD-10-CM

## 2014-10-03 DIAGNOSIS — H409 Unspecified glaucoma: Secondary | ICD-10-CM

## 2014-10-03 DIAGNOSIS — M542 Cervicalgia: Secondary | ICD-10-CM

## 2014-10-03 DIAGNOSIS — E038 Other specified hypothyroidism: Secondary | ICD-10-CM | POA: Insufficient documentation

## 2014-10-03 DIAGNOSIS — M159 Polyosteoarthritis, unspecified: Secondary | ICD-10-CM

## 2014-10-03 DIAGNOSIS — Z1331 Encounter for screening for depression: Secondary | ICD-10-CM

## 2014-10-03 DIAGNOSIS — J449 Chronic obstructive pulmonary disease, unspecified: Secondary | ICD-10-CM

## 2014-10-03 DIAGNOSIS — E785 Hyperlipidemia, unspecified: Secondary | ICD-10-CM

## 2014-10-03 DIAGNOSIS — Z79899 Other long term (current) drug therapy: Secondary | ICD-10-CM

## 2014-10-03 DIAGNOSIS — R001 Bradycardia, unspecified: Secondary | ICD-10-CM

## 2014-10-03 DIAGNOSIS — M150 Primary generalized (osteo)arthritis: Secondary | ICD-10-CM

## 2014-10-03 DIAGNOSIS — E559 Vitamin D deficiency, unspecified: Secondary | ICD-10-CM

## 2014-10-03 DIAGNOSIS — R05 Cough: Secondary | ICD-10-CM

## 2014-10-03 DIAGNOSIS — G8929 Other chronic pain: Secondary | ICD-10-CM

## 2014-10-03 MED ORDER — PREDNISONE 20 MG PO TABS: ORAL_TABLET | ORAL | Status: DC

## 2014-10-03 MED ORDER — CIPROFLOXACIN HCL 500 MG PO TABS: 500.0000 mg | ORAL_TABLET | Freq: Two times a day (BID) | ORAL | Status: DC

## 2014-10-03 NOTE — Patient Instructions (Addendum)
Benefiber is good for constipation/diarrhea/irritable bowel syndrome, it helps with weight loss and can help lower your bad cholesterol. Please do 1-2 TBSP in the morning in water, coffee, or tea. It can take up to a month before you can see a difference with your bowel movements. It is cheapest from [employer], [employer], [employer].   Can do samples steroid inhaler if do not tolerate oral steroids, do 1 puff twice a day and wash mouth out afterwards to avoid yeast.   Do the digoxin every other day due to slow heart rate and monitor it.   Encourage you to get the 3D Mammogram  The 3D Mammogram is much more specific and sensitive to pick up breast cancer. For women with fibrocystic breast or lumpy breast it can be hard to determine if it is cancer or not but the 3D mammogram is able to tell this difference which cuts back on unneeded additional tests or scary call backs.   Preventive Care for Adults A healthy lifestyle and preventive care can promote health and wellness. Preventive health guidelines for women include the following key practices.  A routine yearly physical is a good way to check with your health care provider about your health and preventive screening. It is a chance to share any concerns and updates on your health and to receive a thorough exam.  Visit your dentist for a routine exam and preventive care every 6 months. Brush your teeth twice a day and floss once a day. Good oral hygiene prevents tooth decay and gum disease.  The frequency of eye exams is based on your age, health, family medical history, use of contact lenses, and other factors. Follow your health care provider's recommendations for frequency of eye exams.  Eat a healthy diet. Foods like vegetables, fruits, whole grains, low-fat dairy products, and lean protein foods contain the nutrients you need without too many calories. Decrease your intake of foods high in solid fats, added sugars, and salt. Eat the right amount of  calories for you.Get information about a proper diet from your health care provider, if necessary.  Regular physical exercise is one of the most important things you can do for your health. Most adults should get at least 150 minutes of moderate-intensity exercise (any activity that increases your heart rate and causes you to sweat) each week. In addition, most adults need muscle-strengthening exercises on 2 or more days a week.  Maintain a healthy weight. The body mass index (BMI) is a screening tool to identify possible weight problems. It provides an estimate of body fat based on height and weight. Your health care provider can find your BMI and can help you achieve or maintain a healthy weight.For adults 20 years and older:  A BMI below 18.5 is considered underweight.  A BMI of 18.5 to 24.9 is normal.  A BMI of 25 to 29.9 is considered overweight.  A BMI of 30 and above is considered obese.  Maintain normal blood lipids and cholesterol levels by exercising and minimizing your intake of saturated fat. Eat a balanced diet with plenty of fruit and vegetables. If your lipid or cholesterol levels are high, you are over 50, or you are at high risk for heart disease, you may need your cholesterol levels checked more frequently.Ongoing high lipid and cholesterol levels should be treated with medicines if diet and exercise are not working.  If you smoke, find out from your health care provider how to quit. If you do not use  tobacco, do not start.  Lung cancer screening is recommended for adults aged 83-80 years who are at high risk for developing lung cancer because of a history of smoking. A yearly low-dose CT scan of the lungs is recommended for people who have at least a 30-pack-year history of smoking and are a current smoker or have quit within the past 15 years. A pack year of smoking is smoking an average of 1 pack of cigarettes a day for 1 year (for example: 1 pack a day for 30 years or 2  packs a day for 15 years). Yearly screening should continue until the smoker has stopped smoking for at least 15 years. Yearly screening should be stopped for people who develop a health problem that would prevent them from having lung cancer treatment.  Avoid use of street drugs. Do not share needles with anyone. Ask for help if you need support or instructions about stopping the use of drugs.  High blood pressure causes heart disease and increases the risk of stroke.  Ongoing high blood pressure should be treated with medicines if weight loss and exercise do not work.  If you are 68-67 years old, ask your health care provider if you should take aspirin to prevent strokes.  Diabetes screening involves taking a blood sample to check your fasting blood sugar level. This should be done once every 3 years, after age 83, if you are within normal weight and without risk factors for diabetes. Testing should be considered at a younger age or be carried out more frequently if you are overweight and have at least 1 risk factor for diabetes.  Breast cancer screening is essential preventive care for women. You should practice "breast self-awareness." This means understanding the normal appearance and feel of your breasts and may include breast self-examination. Any changes detected, no matter how small, should be reported to a health care provider. Women in their 85s and 30s should have a clinical breast exam (CBE) by a health care provider as part of a regular health exam every 1 to 3 years. After age 83, women should have a CBE every year. Starting at age 72, women should consider having a mammogram (breast X-ray test) every year. Women who have a family history of breast cancer should talk to their health care provider about genetic screening. Women at a high risk of breast cancer should talk to their health care providers about having an MRI and a mammogram every year.  Breast cancer gene (BRCA)-related cancer  risk assessment is recommended for women who have family members with BRCA-related cancers. BRCA-related cancers include breast, ovarian, tubal, and peritoneal cancers. Having family members with these cancers may be associated with an increased risk for harmful changes (mutations) in the breast cancer genes BRCA1 and BRCA2. Results of the assessment will determine the need for genetic counseling and BRCA1 and BRCA2 testing.  Routine pelvic exams to screen for cancer are no longer recommended for nonpregnant women who are considered low risk for cancer of the pelvic organs (ovaries, uterus, and vagina) and who do not have symptoms. Ask your health care provider if a screening pelvic exam is right for you.  If you have had past treatment for cervical cancer or a condition that could lead to cancer, you need Pap tests and screening for cancer for at least 20 years after your treatment. If Pap tests have been discontinued, your risk factors (such as having a new sexual partner) need to be reassessed to determine  if screening should be resumed. Some women have medical problems that increase the chance of getting cervical cancer. In these cases, your health care provider may recommend more frequent screening and Pap tests.    Colorectal cancer can be detected and often prevented. Most routine colorectal cancer screening begins at the age of 99 years and continues through age 27 years. However, your health care provider may recommend screening at an earlier age if you have risk factors for colon cancer. On a yearly basis, your health care provider may provide home test kits to check for hidden blood in the stool. Use of a small camera at the end of a tube, to directly examine the colon (sigmoidoscopy or colonoscopy), can detect the earliest forms of colorectal cancer. Talk to your health care provider about this at age 55, when routine screening begins. Direct exam of the colon should be repeated every 5-10 years  through age 59 years, unless early forms of pre-cancerous polyps or small growths are found.  Osteoporosis is a disease in which the bones lose minerals and strength with aging. This can result in serious bone fractures or breaks. The risk of osteoporosis can be identified using a bone density scan. Women ages 58 years and over and women at risk for fractures or osteoporosis should discuss screening with their health care providers. Ask your health care provider whether you should take a calcium supplement or vitamin D to reduce the rate of osteoporosis.  Menopause can be associated with physical symptoms and risks. Hormone replacement therapy is available to decrease symptoms and risks. You should talk to your health care provider about whether hormone replacement therapy is right for you.  Use sunscreen. Apply sunscreen liberally and repeatedly throughout the day. You should seek shade when your shadow is shorter than you. Protect yourself by wearing long sleeves, pants, a wide-brimmed hat, and sunglasses year round, whenever you are outdoors.  Once a month, do a whole body skin exam, using a mirror to look at the skin on your back. Tell your health care provider of new moles, moles that have irregular borders, moles that are larger than a pencil eraser, or moles that have changed in shape or color.  Stay current with required vaccines (immunizations).  Influenza vaccine. All adults should be immunized every year.  Tetanus, diphtheria, and acellular pertussis (Td, Tdap) vaccine. Pregnant women should receive 1 dose of Tdap vaccine during each pregnancy. The dose should be obtained regardless of the length of time since the last dose. Immunization is preferred during the 27th-36th week of gestation. An adult who has not previously received Tdap or who does not know her vaccine status should receive 1 dose of Tdap. This initial dose should be followed by tetanus and diphtheria toxoids (Td) booster  doses every 10 years. Adults with an unknown or incomplete history of completing a 3-dose immunization series with Td-containing vaccines should begin or complete a primary immunization series including a Tdap dose. Adults should receive a Td booster every 10 years.    Zoster vaccine. One dose is recommended for adults aged 32 years or older unless certain conditions are present.    Pneumococcal 13-valent conjugate (PCV13) vaccine. When indicated, a person who is uncertain of her immunization history and has no record of immunization should receive the PCV13 vaccine. An adult aged 19 years or older who has certain medical conditions and has not been previously immunized should receive 1 dose of PCV13 vaccine. This PCV13 should be followed with  a dose of pneumococcal polysaccharide (PPSV23) vaccine. The PPSV23 vaccine dose should be obtained at least 8 weeks after the dose of PCV13 vaccine. An adult aged 31 years or older who has certain medical conditions and previously received 1 or more doses of PPSV23 vaccine should receive 1 dose of PCV13. The PCV13 vaccine dose should be obtained 1 or more years after the last PPSV23 vaccine dose.    Pneumococcal polysaccharide (PPSV23) vaccine. When PCV13 is also indicated, PCV13 should be obtained first. All adults aged 59 years and older should be immunized. An adult younger than age 93 years who has certain medical conditions should be immunized. Any person who resides in a nursing home or long-term care facility should be immunized. An adult smoker should be immunized. People with an immunocompromised condition and certain other conditions should receive both PCV13 and PPSV23 vaccines. People with human immunodeficiency virus (HIV) infection should be immunized as soon as possible after diagnosis. Immunization during chemotherapy or radiation therapy should be avoided. Routine use of PPSV23 vaccine is not recommended for American Indians, [place] Natives, or  people younger than 65 years unless there are medical conditions that require PPSV23 vaccine. When indicated, people who have unknown immunization and have no record of immunization should receive PPSV23 vaccine. One-time revaccination 5 years after the first dose of PPSV23 is recommended for people aged 19-64 years who have chronic kidney failure, nephrotic syndrome, asplenia, or immunocompromised conditions. People who received 1-2 doses of PPSV23 before age 83 years should receive another dose of PPSV23 vaccine at age 52 years or later if at least 5 years have passed since the previous dose. Doses of PPSV23 are not needed for people immunized with PPSV23 at or after age 45 years.   Preventive Services / Frequency  Ages 25 years and over  Blood pressure check.  Lipid and cholesterol check.  Lung cancer screening. / Every year if you are aged 44-80 years and have a 30-pack-year history of smoking and currently smoke or have quit within the past 15 years. Yearly screening is stopped once you have quit smoking for at least 15 years or develop a health problem that would prevent you from having lung cancer treatment.  Clinical breast exam.** / Every year after age 32 years.  BRCA-related cancer risk assessment.** / For women who have family members with a BRCA-related cancer (breast, ovarian, tubal, or peritoneal cancers).  Mammogram.** / Every year beginning at age 74 years and continuing for as long as you are in good health. Consult with your health care provider.  Pap test.** / Every 3 years starting at age 71 years through age 76 or 12 years with 3 consecutive normal Pap tests. Testing can be stopped between 65 and 70 years with 3 consecutive normal Pap tests and no abnormal Pap or HPV tests in the past 10 years.  Fecal occult blood test (FOBT) of stool. / Every year beginning at age 20 years and continuing until age 36 years. You may not need to do this test if you get a colonoscopy every  10 years.  Flexible sigmoidoscopy or colonoscopy.** / Every 5 years for a flexible sigmoidoscopy or every 10 years for a colonoscopy beginning at age 43 years and continuing until age 104 years.  Hepatitis C blood test.** / For all people born from [birth year] through 1967 and any individual with known risks for hepatitis C.  Osteoporosis screening.** / A one-time screening for women ages 56 years and over and women at  risk for fractures or osteoporosis.  Skin self-exam. / Monthly.  Influenza vaccine. / Every year.  Tetanus, diphtheria, and acellular pertussis (Tdap/Td) vaccine.** / 1 dose of Td every 10 years.  Zoster vaccine.** / 1 dose for adults aged 57 years or older.  Pneumococcal 13-valent conjugate (PCV13) vaccine.** / Consult your health care provider.  Pneumococcal polysaccharide (PPSV23) vaccine.** / 1 dose for all adults aged 63 years and older. Screening for abdominal aortic aneurysm (AAA)  by ultrasound is recommended for people who have history of high blood pressure or who are current or former smokers.

## 2014-10-03 NOTE — Progress Notes (Signed)
Medicare wellness and medication follow up  Assessment:   1. Essential hypertension - continue medications, DASH diet, exercise and monitor at home. Call if greater than 130/80.   2. Prediabetes Discussed general issues about diabetes pathophysiology and management., Educational material distributed., Suggested low cholesterol diet., Encouraged aerobic exercise., Discussed foot care., Reminded to get yearly retinal exam.  3. Hyperlipemia -continue medications, check lipids, decrease fatty foods, increase activity.   4. Vitamin D deficiency Continue supplement  5. Encounter for long-term (current) use of medications Continue supplement  6. Chronic obstructive pulmonary disease, unspecified COPD, unspecified chronic bronchitis type Stop theophyline and start Dulera 200 samples BID, follow up 2 months.  Advised to stop smoking, will get CXR at [facility], continue meds.   7. At high risk for falls Declines PT at this time, discussed fall prevention  8. Estrogen deficiency Get DEXA  9. Cough Stop smoking, get on medications - predniSONE (DELTASONE) 20 MG tablet; 2 tablets daily for 3 days, 1 tablet daily for 4 days.  Dispense: 10 tablet; Refill: 0 - ciprofloxacin (CIPRO) 500 MG tablet; Take 1 tablet (500 mg total) by mouth 2 (two) times daily. 7 days  Dispense: 14 tablet; Refill: 0  10. Depression screen + stay on zoloft, advised counseling, no SI/HI  11. Glaucoma Continue eye doctor follow up  12. Hypothyroidism, unspecified hypothyroidism type Hypothyroidism-check TSH level, continue medications the same, reminded to take on an empty stomach 30-7mins before food.   13. IBS (irritable bowel syndrome) Diet controlled  14. Primary osteoarthritis involving multiple joints Continue pain management  15. Chronic neck pain Continue pain management  16. Chronic back pain Continue pain management  17. Mitral valve prolapse Continue propanolol but due to sinus brady cut back  digoxin to QOD and may need to stop.   18. Sinus bradycardia Continue propanolol but due to sinus brady cut back digoxin to QOD and may need to stop.   19. Need for pneumococcal vaccination - Pneumococcal conjugate vaccine 13-valent IM  20.  Encounter for Medicare annual wellness exam   Plan:   During the course of the visit the patient was educated and counseled about appropriate screening and preventive services including:    Pneumococcal vaccine   Influenza vaccine  Td vaccine  Screening electrocardiogram  Screening mammography  Bone densitometry screening  Colorectal cancer screening  Diabetes screening  Glaucoma screening  Nutrition counseling   Smoking cessation counseling   Conditions/risks identified: BMI: Discussed weight loss, diet, and increase physical activity.  Increase physical activity: AHA recommends 150 minutes of physical activity a week.  Medications reviewed DEXA- ordered Diabetes is at goal,  Urinary Incontinence is an issue: discussed non pharmacology and pharmacology options.  Fall risk: high- discussed PT, home fall assessment, medications.   Subjective:   Destiny Hughes is a 69 y.o. female who presents for Medicare Annual Wellness Visit and follow up on medications, her theophyline was discontinued.  Date of last medicare wellness visit was 07/2013  Her blood pressure has been controlled at home, today their BP is BP: 122/78 mmHg She does not workout. She denies chest pain but has SOB with exertion. She has COPD due smoking history, continues to smoke and does want to quit. She was on theophyline from a previous lung doctor but this is discontinued. She has never been on an inhaler for her COPD.  She follows with Dr. Mare Ferrari for MVP prolapse/palpitations, she is on inderal and digoxin.  She is not on cholesterol medication and denies  myalgias. Her cholesterol is not at goal. The cholesterol last visit was:   Lab Results   Component Value Date   CHOL 218* 05/21/2014   HDL 40* 05/21/2014   LDLCALC 126* 05/21/2014   TRIG 258* 05/21/2014   CHOLHDL 5.5 05/21/2014   She has been working on diet and exercise for prediabetes, and denies polydipsia and polyuria. Last A1C in the office was:  Lab Results  Component Value Date   HGBA1C 6.3* 05/21/2014   Patient is on Vitamin D supplement. Frequent falls due unstable balance, pain that she is in, follows with Dr. Hardin Negus with pain mangement.   She is on thyroid medication. Her medication was not changed last visit.   Lab Results  Component Value Date   TSH 4.196 05/21/2014   She has cough with green yellow mucus, worsening SOB, sinus issues, ear popping/clicking.  Has RLQ pain some diarrhea, no blood/mucus.  Has nausea with eating food, has dysphagia and follows with Dr. Wynetta Emery.    Names of Other Physician/Practitioners you currently use: 1. [facility]- here for primary care 2. [place], eye doctor, last visit 2 month ago, + change in vision due to cataracts 3. , dentist, declines 4. Dr. Hardin Negus for pain managment Patient Care Team: Unk Pinto, MD as PCP - General (Internal Medicine) Paralee Cancel, MD as Consulting Physician (Orthopedic Surgery) Darlin Coco, MD as Consulting Physician (Cardiology) Garlan Fair, MD as Consulting Physician (Gastroenterology) Nicholaus Bloom, MD (Anesthesiology)  Medication Review Current Outpatient Prescriptions on File Prior to Visit  Medication Sig Dispense Refill  . Biotin (BIOTIN 5000) 5 MG CAPS Take 1 capsule by mouth daily.      . Cholecalciferol (VITAMIN D PO) Take 5,000 Units by mouth daily.    . diazepam (VALIUM) 5 MG tablet Take 5 mg by mouth every 6 (six) hours as needed (for muscle spams).     Marland Kitchen DIGOX 250 MCG tablet TAKE 1 TABLET BY MOUTH EVERY MORNING 90 tablet 1  . levofloxacin (LEVAQUIN) 500 MG tablet Take 1 tablet daily with food for  infection 15 tablet 1  . levothyroxine (SYNTHROID, LEVOTHROID) 150 MCG tablet Take 1 to 1&1/2 tablets daily as directed 135 tablet 1  . methadone (DOLOPHINE) 5 MG tablet Take 5 mg by mouth every 6 (six) hours.     Marland Kitchen OVER THE COUNTER MEDICATION OTC Flonase nasal spray PRN    . oxyCODONE (ROXICODONE) 15 MG immediate release tablet   0  . promethazine (PHENERGAN) 25 MG tablet Take 1 tablet 4 x daily with your pain meds 120 tablet 5  . propranolol (INDERAL) 80 MG tablet Take 1 tablet 2 x daily for BP 180 tablet 0  . sertraline (ZOLOFT) 100 MG tablet Take 150 mg by mouth every morning.     . theophylline (THEODUR) 200 MG 12 hr tablet   0  . [DISCONTINUED] ferrous sulfate 325 (65 FE) MG tablet Take 1 tablet (325 mg total) by mouth 3 (three) times daily after meals.    . [DISCONTINUED] rivaroxaban (XARELTO) 10 MG TABS tablet Take 1 tablet (10 mg total) by mouth daily. 12 tablet    No current facility-administered medications on file prior to visit.    Current Problems (verified) Patient Active Problem List   Diagnosis Date Noted  . IBS (irritable bowel syndrome) 04/01/2014  . H/O total knee replacement 04/22/2013  . Essential hypertension 04/15/2013  . Vitamin D deficiency 04/15/2013  . Encounter for long-term (current) use of  medications 04/15/2013  . Prediabetes 04/15/2013  . Osteoarthritis 01/31/2011  . Hyperlipemia   . Glaucoma   . Chronic neck pain   . Chronic back pain     Screening Tests Health Maintenance  Topic Date Due  . ZOSTAVAX  09/14/2003  . DEXA SCAN  09/13/2008  . MAMMOGRAM  09/17/2008  . PNA vac Low Risk Adult (2 of 2 - PCV13) 10/17/2012  . INFLUENZA VACCINE  10/20/2014  . TETANUS/TDAP  03/21/2020  . COLONOSCOPY  07/24/2022     Immunization History  Administered Date(s) Administered  . Pneumococcal Polysaccharide-23 10/18/2011  . Td 03/21/2010    Preventative care: Last colonoscopy: 07/2012 Dr. Wynetta Emery, declines another Last mammogram: 2008, declines  another Last pap smear/pelvic exam: declines DEXA: will do it ABI and Carotids normal 2012 CXR 07/2013 Cardiolite/echo normal 2012, mild AS  Prior vaccinations: TD or Tdap: 2012  Influenza: declines Pneumococcal: 2013 Prevnar 13: Due today Shingles/Zostavax: declines  Allergies Allergies  Allergen Reactions  . Ventolin [Kdc:Albuterol] Anaphylaxis  . Albuterol   . Codeine Nausea And Vomiting  . Penicillins Swelling  . Ecotrin [Aspirin] Nausea And Vomiting   Surgical history Past Surgical History  Procedure Laterality Date  . Spinal cord stimulator implant  2009 APPROX    DR. ELSNER  . Cervical spine surgery  01-25-11    2005-multiple levels  . Thoracic spine surgery  01-25-11    6'02- then nerve stimulator implanted after  . Tubal ligation    . Appendectomy  01-25-11  . Joint replacement  01-25-11    6'03 LTHA-hemi  . Knee arthroplasty  01-25-11    '04-right, revised x2  . Cholecystectomy  01-25-11    '07  . Carpal tunnel release  01-25-11    Bil.  . Total knee revision  02/01/2011    Procedure: TOTAL KNEE REVISION;  Surgeon: Mauri Pole;  Location: [facility];  Service: Orthopedics;  Laterality: Right;  . Colonoscopy with propofol N/A 07/23/2012    Procedure: COLONOSCOPY WITH PROPOFOL;  Surgeon: Garlan Fair, MD;  Location: [facility];  Service: Endoscopy;  Laterality: N/A;   Family history Family History  Problem Relation Age of Onset  . Heart attack Father   . Aneurysm Mother     CEREBRAL  . Diabetes Son   . Hypertension Son   . Hypertension Son    Risk Factors: Osteoporosis: postmenopausal estrogen deficiency and dietary calcium and/or vitamin D deficiency History of fracture in the past year: no  Tobacco History  Substance Use Topics  . Smoking status: Current Every Day Smoker -- 0.50 packs/day for 50 years    Types: Cigarettes  . Smokeless tobacco: Never Used  . Alcohol Use: No   She does smoke.   Are there smokers in your home (other than you)?   yes  Alcohol Current alcohol use: none  Caffeine Current caffeine use: coffee 1-2 /day  Exercise Exercise limitations: The patient is experiencing exercise intolerance (due to back pain). Current exercise: none  Nutrition/Diet Current diet: in general, a "healthy" diet    Cardiac risk factors: advanced age (older than 56 for men, 88 for women), diabetes mellitus, dyslipidemia, family history of premature cardiovascular disease, hypertension, obesity (BMI >= 30 kg/m2), sedentary lifestyle and smoking/ tobacco exposure.  Depression Screen- states yes due to chronic pain (Note: if answer to either of the following is "Yes", a more complete depression screening is indicated)   Q1: Over the past two weeks, have you felt down, depressed or hopeless? Yes  Q2: Over the past two weeks, have you felt little interest or pleasure in doing things? Yes  Have you lost interest or pleasure in daily life? Yes  Do you often feel hopeless? No  Do you cry easily over simple problems? No  Activities of Daily Living In your present state of health, do you have any difficulty performing the following activities?:  Driving? Yes at night due to cataracts Managing money?  No Feeding yourself? No Getting from bed to chair? No Climbing a flight of stairs? Yes due to pain Preparing food and eating?: No Bathing or showering? No Getting dressed: No Getting to the toilet? No Using the toilet:No Moving around from place to place: Yes In the past year have you fallen or had a near fall?:Yes  Vision Difficulties: Yes  Hearing Difficulties: No Do you often ask people to speak up or repeat themselves? No Do you experience ringing or noises in your ears? No Do you have difficulty understanding soft or whispered voices? No  Cognition  Do you feel that you have a problem with memory?Yes  Do you often misplace items? No  Do you feel safe at home?  Yes  Advanced directives Does patient have a [place]? Yes Does patient have a Living Will? Yes   Objective:   Blood pressure 122/78, pulse 56, temperature 97.7 F (36.5 C), resp. rate 16, height 5' 6.5" (1.689 m), weight 159 lb (72.122 kg). Body mass index is 25.28 kg/(m^2).  General appearance: alert, no distress, WD/WN,  female Cognitive Testing  Alert? Yes  Normal Appearance?Yes  Oriented to person? Yes  Place? Yes   Time? Yes  Recall of three objects?  Yes  Can perform simple calculations? Yes  Displays appropriate judgment?Yes  Can read the correct time from a watch face?Yes  Appears well nourished - in no distress.  Eyes: PERRLA, EOMs, conjunctiva no swelling or erythema.  Sinuses: No frontal/maxillary tenderness  ENT/Mouth: EAC's clear, TM's nl w/o erythema, bulging. Nares clear w/o erythema, swelling, exudates. Oropharynx clear without erythema or exudates. Oral hygiene is good. Tongue normal, non obstructing. Hearing intact.  Neck: Supple. Thyroid nl. Car 2+/2+ without bruits, nodes or JVD.  Chest: Respirations decreased diffusely with RLL wheezing without rales, rhonchi, or stridor.  Cor: Heart sounds normal w/ regular rate and rhythm without sig. murmurs, gallops, clicks, or rubs. Peripheral pulses normal and equal with 1 + edema.  Abdomen: Soft & bowel sounds normal. Non-tender w/o guarding, rebound, masses, or organomegaly. Right lower abdominal hernia, retractable Lymphatics: Unremarkable.  Musculoskeletal: Difficult exam, 3/5 diffuse weakness and tender "everywhere" Gait is slow and unsteady being stabilized with a cane.  Skin: Warm, dry without exposed rashes, lesions, ecchymosis apparent.  Neuro: Cranial nerves intact, reflexes equal bilaterally. Sensory-motor testing grossly intact. Tendon reflexes grossly intact.  Pysch: Alert & oriented x 3. Insight and judgement nl & appropriate. No ideations. Breast: defer Gyn: defer Rectal: defer  Medicare Attestation I have personally reviewed: The  patient's medical and social history Their use of alcohol, tobacco or illicit drugs Their current medications and supplements The patient's functional ability including ADLs,fall risks, home safety risks, cognitive, and hearing and visual impairment Diet and physical activities Evidence for depression or mood disorders  The patient's weight, height, BMI, and visual acuity have been recorded in the chart.  I have made referrals, counseling, and provided education to the patient based on review of the above and I have provided the patient with a written  personalized care plan for preventive services.     Vicie Mutters, PA-C   10/03/2014

## 2014-10-14 DIAGNOSIS — M5412 Radiculopathy, cervical region: Secondary | ICD-10-CM | POA: Diagnosis not present

## 2014-10-14 DIAGNOSIS — M791 Myalgia: Secondary | ICD-10-CM | POA: Diagnosis not present

## 2014-10-14 DIAGNOSIS — M961 Postlaminectomy syndrome, not elsewhere classified: Secondary | ICD-10-CM | POA: Diagnosis not present

## 2014-10-14 DIAGNOSIS — G894 Chronic pain syndrome: Secondary | ICD-10-CM | POA: Diagnosis not present

## 2014-10-21 ENCOUNTER — Ambulatory Visit (INDEPENDENT_AMBULATORY_CARE_PROVIDER_SITE_OTHER): Payer: Medicare Other | Admitting: Physician Assistant

## 2014-10-21 ENCOUNTER — Encounter: Payer: Self-pay | Admitting: Physician Assistant

## 2014-10-21 ENCOUNTER — Ambulatory Visit (HOSPITAL_COMMUNITY)
Admission: RE | Admit: 2014-10-21 | Discharge: 2014-10-21 | Disposition: A | Discharge status: Home / Self Care | Payer: Medicare Other | Source: Ambulatory Visit | Attending: Physician Assistant | Admitting: Physician Assistant

## 2014-10-21 VITALS — BP 124/70 | HR 64 | Temp 98.6°F | Resp 18 | Ht 66.5 in | Wt 159.0 lb

## 2014-10-21 DIAGNOSIS — R059 Cough, unspecified: Secondary | ICD-10-CM

## 2014-10-21 DIAGNOSIS — F17210 Nicotine dependence, cigarettes, uncomplicated: Secondary | ICD-10-CM | POA: Diagnosis not present

## 2014-10-21 DIAGNOSIS — R05 Cough: Secondary | ICD-10-CM | POA: Diagnosis present

## 2014-10-21 DIAGNOSIS — J45909 Unspecified asthma, uncomplicated: Secondary | ICD-10-CM | POA: Insufficient documentation

## 2014-10-21 DIAGNOSIS — J449 Chronic obstructive pulmonary disease, unspecified: Secondary | ICD-10-CM | POA: Diagnosis not present

## 2014-10-21 DIAGNOSIS — M479 Spondylosis, unspecified: Secondary | ICD-10-CM | POA: Diagnosis not present

## 2014-10-21 MED ORDER — DOXYCYCLINE HYCLATE 100 MG PO TABS: 100.0000 mg | ORAL_TABLET | Freq: Two times a day (BID) | ORAL | Status: DC

## 2014-10-21 MED ORDER — THEOPHYLLINE ER 200 MG PO TB12: 200.0000 mg | ORAL_TABLET | Freq: Two times a day (BID) | ORAL | Status: DC

## 2014-10-21 MED ORDER — THEOPHYLLINE ER 200 MG PO CP24: 200.0000 mg | ORAL_CAPSULE | Freq: Two times a day (BID) | ORAL | Status: DC

## 2014-10-21 MED ORDER — PREDNISONE 20 MG PO TABS: ORAL_TABLET | ORAL | Status: DC

## 2014-10-21 NOTE — Progress Notes (Signed)
Subjective:    Patient ID: Destiny Hughes, female    DOB: 02-Sep-1945, 69 y.o.   MRN: [id number]  HPI 69 y.o. smoking WF with history of COPD, HTN, MVP, preDM, thyroid, chol presents with cough. She was taken off her theophyline due to availability and put on [facility] but that did not help, she states that her breathing is worse, worse cough, fatigue, with chills, no fever . She is intolerant to albuertol.   Blood pressure 124/70, pulse 64, temperature 98.6 F (37 C), resp. rate 18, height 5' 6.5" (1.689 m), weight 159 lb (72.122 kg).  Current Outpatient Prescriptions on File Prior to Visit  Medication Sig Dispense Refill  . Biotin (BIOTIN 5000) 5 MG CAPS Take 1 capsule by mouth daily.      . Cholecalciferol (VITAMIN D PO) Take 5,000 Units by mouth daily.    . ciprofloxacin (CIPRO) 500 MG tablet Take 1 tablet (500 mg total) by mouth 2 (two) times daily. 7 days 14 tablet 0  . diazepam (VALIUM) 5 MG tablet Take 5 mg by mouth every 6 (six) hours as needed (for muscle spams).     Marland Kitchen DIGOX 250 MCG tablet TAKE 1 TABLET BY MOUTH EVERY MORNING 90 tablet 1  . levothyroxine (SYNTHROID, LEVOTHROID) 150 MCG tablet Take 1 to 1&1/2 tablets daily as directed 135 tablet 1  . methadone (DOLOPHINE) 5 MG tablet Take 5 mg by mouth every 6 (six) hours.     Marland Kitchen OVER THE COUNTER MEDICATION OTC Flonase nasal spray PRN    . oxyCODONE (ROXICODONE) 15 MG immediate release tablet   0  . predniSONE (DELTASONE) 20 MG tablet 2 tablets daily for 3 days, 1 tablet daily for 4 days. 10 tablet 0  . promethazine (PHENERGAN) 25 MG tablet Take 1 tablet 4 x daily with your pain meds 120 tablet 5  . propranolol (INDERAL) 80 MG tablet Take 1 tablet 2 x daily for BP 180 tablet 0  . sertraline (ZOLOFT) 100 MG tablet Take 150 mg by mouth every morning.     . [DISCONTINUED] ferrous sulfate 325 (65 FE) MG tablet Take 1 tablet (325 mg total) by mouth 3 (three) times daily after meals.    . [DISCONTINUED] rivaroxaban (XARELTO) 10 MG TABS  tablet Take 1 tablet (10 mg total) by mouth daily. 12 tablet    No current facility-administered medications on file prior to visit.    Past Medical History  Diagnosis Date  . Palpitation   . Tachycardia   . Dyspnea   . Hyperlipemia   . Glaucoma   . Chronic neck pain   . Chronic back pain   . Collapse of right lung   . Hypothyroidism     tx. Levothyroxine  . Blood transfusion     after Thoracic surgery ?'04  . GERD (gastroesophageal reflux disease)     tx. TUMS  . Depression   . COPD (chronic obstructive pulmonary disease)     Hx. Bronchitis occ, 01-25-11 somes issue now taking  Z-pak-started 01-24-11/Scar tissue  present  from previous lung collapse  . Heart murmur   . Headache(784.0) 01-25-11    neck and nerve pain related.  . Arthritis 01-25-11    back and most joints  . Kidney calculi 01-25-11    1964  . Neuromuscular disorder 01-25-11    Pain/nerve stimulator implanted -2 yrs ago.  Marland Kitchen Dysrhythmia     hx.PAT- tx. Inderal    Review of Systems  Constitutional: Positive for fatigue. Negative  for fever and chills.  HENT: Positive for congestion, rhinorrhea, sinus pressure and sore throat. Negative for dental problem, ear discharge, ear pain, nosebleeds, trouble swallowing and voice change.   Respiratory: Positive for cough, chest tightness, shortness of breath and wheezing.   Cardiovascular: Negative.   Gastrointestinal: Negative.   Genitourinary: Negative.   Musculoskeletal: Negative.   Neurological: Negative.       Objective:   Physical Exam  Constitutional: She is oriented to person, place, and time. She appears well-developed and well-nourished.  HENT:  Head: Normocephalic and atraumatic.  Right Ear: External ear normal.  Left Ear: External ear normal.  Nose: Nose normal.  Mouth/Throat: Oropharynx is clear and moist.  Eyes: Conjunctivae are normal. Pupils are equal, round, and reactive to light.  Neck: Normal range of motion. Neck supple.  Cardiovascular:  Normal rate and regular rhythm.   Pulmonary/Chest: Effort normal. No respiratory distress. She has wheezes (O2 94-95% RA). She has no rales. She exhibits no tenderness.  Abdominal: Soft. Bowel sounds are normal.  Lymphadenopathy:    She has no cervical adenopathy.  Neurological: She is alert and oriented to person, place, and time.  Skin: Skin is warm and dry.      Assessment & Plan:  Cough  - will try out patient treatment DDX COPD exacerbation, pneumonia, bronchitis, cancer Get CXR, last one was in may, get on doxy/prendisone, declines albuterol, will try spriva, get back on theo24 200mg  BID, will put in a referral to pulmonary due to severe COPD and unwillingness to try different medications Will go to the ER if SOB worse.

## 2014-10-21 NOTE — Addendum Note (Signed)
Addended by: Vicie Mutters R on: 10/21/2014 01:41 PM   Modules accepted: Orders

## 2014-10-21 NOTE — Patient Instructions (Signed)

## 2014-11-11 ENCOUNTER — Institutional Professional Consult (permissible substitution): Payer: Medicare Other | Admitting: Pulmonary Disease

## 2014-11-11 DIAGNOSIS — M5412 Radiculopathy, cervical region: Secondary | ICD-10-CM | POA: Diagnosis not present

## 2014-11-11 DIAGNOSIS — M961 Postlaminectomy syndrome, not elsewhere classified: Secondary | ICD-10-CM | POA: Diagnosis not present

## 2014-11-11 DIAGNOSIS — G894 Chronic pain syndrome: Secondary | ICD-10-CM | POA: Diagnosis not present

## 2014-11-11 DIAGNOSIS — M5481 Occipital neuralgia: Secondary | ICD-10-CM | POA: Diagnosis not present

## 2014-11-13 ENCOUNTER — Ambulatory Visit (INDEPENDENT_AMBULATORY_CARE_PROVIDER_SITE_OTHER): Payer: Medicare Other | Admitting: Pulmonary Disease

## 2014-11-13 ENCOUNTER — Encounter: Payer: Self-pay | Admitting: Pulmonary Disease

## 2014-11-13 VITALS — BP 136/82 | HR 58 | Ht 66.0 in | Wt 159.0 lb

## 2014-11-13 DIAGNOSIS — K219 Gastro-esophageal reflux disease without esophagitis: Secondary | ICD-10-CM

## 2014-11-13 DIAGNOSIS — Z720 Tobacco use: Secondary | ICD-10-CM | POA: Diagnosis not present

## 2014-11-13 DIAGNOSIS — J449 Chronic obstructive pulmonary disease, unspecified: Secondary | ICD-10-CM | POA: Diagnosis not present

## 2014-11-13 DIAGNOSIS — J42 Unspecified chronic bronchitis: Secondary | ICD-10-CM | POA: Diagnosis not present

## 2014-11-13 DIAGNOSIS — R05 Cough: Secondary | ICD-10-CM

## 2014-11-13 DIAGNOSIS — R059 Cough, unspecified: Secondary | ICD-10-CM

## 2014-11-13 DIAGNOSIS — F17200 Nicotine dependence, unspecified, uncomplicated: Secondary | ICD-10-CM

## 2014-11-13 MED ORDER — RANITIDINE HCL 150 MG PO TABS: 150.0000 mg | ORAL_TABLET | Freq: Every day | ORAL | Status: DC

## 2014-11-13 MED ORDER — BENZONATATE 100 MG PO CAPS: 100.0000 mg | ORAL_CAPSULE | Freq: Three times a day (TID) | ORAL | Status: DC | PRN

## 2014-11-13 NOTE — Patient Instructions (Signed)
1. We are checking breathing tests to determine how severe your COPD is. 2. Contact me if you cough changes color or you start to have fevers. 3. Start taking Zantac at night before bed and avoid eating within 2-3 hours of bedtime. 4. I'm sending a prescription for Tessalon Perles into your pharmacy to help with your cough. 5. I will see you back in 1-2 months to go over your test results. 6. Please continue to try to taper off of your cigarette use.

## 2014-11-13 NOTE — Progress Notes (Signed)
Subjective:    Patient ID: Destiny Hughes, female    DOB: 03-Jul-1945, 69 y.o.   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 She reports she has bronchitis routinely at least 3 times a year treated with antibiotics and steroids. She reports she has been having recurrent bronchitis for 10 years and seems to be worsening. She reports she has been experiencing increasing dyspnea. She reports her joint & muscle pains limit her exertional abilities. She cannot walk to her mailbox which is "125yds". It takes her 1.5 hours in the morning to get dressed and wash up. She reports recently she has been on antibiotics for 17 days. She reports her voice has also changed. She reports her cough is now productive of green mucus recently. She is coughing up a couple of cups of phlegm daily. She reports her cough is now productive of a clear mucus. She denies any hemoptysis. She does have frequent wheezing. She reports chronic chest pressure in her right hemithorax that is chronic. No new chest pain. She denies any subjective fever. She does report some chills and sweats. She reports epigastric discomfort & dyspepsia with eating. This also causes some nausea. She reports she does have reflux but cannot clarify how frequently.   Review of Systems No rashes or bruising. She does have frequent sinus congestion & drainage. Sinus drainage is clear. A pertinent 14 point review of systems is negative except as per the history of presenting illness.  Allergies  Allergen Reactions  . Ventolin [Kdc:Albuterol] Anaphylaxis  . Albuterol   . Codeine Nausea And Vomiting  . Penicillins Swelling  . Ecotrin [Aspirin] Nausea And Vomiting   Current Outpatient Prescriptions on File Prior to Visit  Medication Sig Dispense Refill  . Biotin (BIOTIN 5000) 5 MG CAPS Take 1 capsule by mouth daily.      . Cholecalciferol (VITAMIN D PO) Take 5,000  Units by mouth daily.    . diazepam (VALIUM) 5 MG tablet Take 5 mg by mouth every 6 (six) hours as needed (for muscle spams).     Marland Kitchen DIGOX 250 MCG tablet TAKE 1 TABLET BY MOUTH EVERY MORNING 90 tablet 1  . levothyroxine (SYNTHROID, LEVOTHROID) 150 MCG tablet Take 1 to 1&1/2 tablets daily as directed 135 tablet 1  . methadone (DOLOPHINE) 5 MG tablet Take 5 mg by mouth every 6 (six) hours.     Marland Kitchen OVER THE COUNTER MEDICATION OTC Flonase nasal spray PRN    . oxyCODONE (ROXICODONE) 15 MG immediate release tablet   0  . promethazine (PHENERGAN) 25 MG tablet Take 1 tablet 4 x daily with your pain meds 120 tablet 5  . propranolol (INDERAL) 80 MG tablet Take 1 tablet 2 x daily for BP 180 tablet 0  . sertraline (ZOLOFT) 100 MG tablet Take 150 mg by mouth every morning.     . theophylline (THEODUR) 200 MG 12 hr tablet Take 1 tablet (200 mg total) by mouth 2 (two) times daily. 60 tablet 2  . [DISCONTINUED] ferrous sulfate 325 (65 FE) MG tablet Take 1 tablet (325 mg total) by mouth 3 (three) times daily after meals.    . [DISCONTINUED] rivaroxaban (XARELTO) 10 MG TABS tablet Take 1 tablet (10 mg total) by mouth daily. 12 tablet    No current facility-administered medications on file prior to visit.   Past Medical History  Diagnosis Date  .  Palpitation   . Tachycardia   . Dyspnea   . Hyperlipemia   . Glaucoma   . Chronic neck pain   . Chronic back pain   . Collapse of right lung   . Hypothyroidism     tx. Levothyroxine  . Blood transfusion     after Thoracic surgery ?'04  . GERD (gastroesophageal reflux disease)     tx. TUMS  . Depression   . COPD (chronic obstructive pulmonary disease)     Hx. Bronchitis occ, 01-25-11 somes issue now taking  Z-pak-started 01-24-11/Scar tissue  present  from previous lung collapse  . Heart murmur   . Headache(784.0) 01-25-11    neck and nerve pain related.  . Arthritis 01-25-11    back and most joints  . Neuromuscular disorder 01-25-11    Pain/nerve stimulator  implanted -2 yrs ago.  Marland Kitchen Dysrhythmia     hx.PAT- tx. Inderal  . Nephrolithiasis    Past Surgical History  Procedure Laterality Date  . Spinal cord stimulator implant  2009 APPROX    DR. ELSNER  . Cervical spine surgery  01-25-11    2005-multiple levels  . Thoracic spine surgery  01-25-11    6'02- then nerve stimulator implanted after  . Tubal ligation    . Appendectomy  01-25-11  . Joint replacement  01-25-11    6'03 LTHA-hemi  . Knee arthroplasty  01-25-11    '04-right, revised x2  . Cholecystectomy  01-25-11    '07  . Carpal tunnel release  01-25-11    Bil.  . Total knee revision  02/01/2011    Procedure: TOTAL KNEE REVISION;  Surgeon: Mauri Pole;  Location: [facility];  Service: Orthopedics;  Laterality: Right;  . Colonoscopy with propofol N/A 07/23/2012    Procedure: COLONOSCOPY WITH PROPOFOL;  Surgeon: Garlan Fair, MD;  Location: [facility];  Service: Endoscopy;  Laterality: N/A;   Family History  Problem Relation Age of Onset  . Heart attack Father   . Emphysema Father   . Aneurysm Mother     CEREBRAL  . Emphysema Mother   . Diabetes Son   . Hypertension Son   . Hypertension Son   . Asthma Son     x2  . Breast cancer Paternal Aunt   . Rheum arthritis Maternal Aunt    Social History   Social History  . Marital Status: Married    Spouse Name: N/A  . Number of Children: 2  . Years of Education: N/A   Occupational History  . disabled    Social History Main Topics  . Smoking status: Current Every Day Smoker -- 0.50 packs/day for 50 years    Types: Cigarettes    Start date: 09/13/1961  . Smokeless tobacco: Never Used     Comment: Peak rate of 1ppd  . Alcohol Use: No     Comment: none in 10 years  . Drug Use: No  . Sexual Activity: No   Other Topics Concern  . None   Social History Narrative   She is from [facility]. She has always lived in [place]. She has traveled to [place], [place], [place], [place], & [place]. Worked as a Merchandiser, retail. Worked in a Estate agent.  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 Prior exposure to parakeets with last exposure remote in her current home. Previously had mold in her home that was in her bathroom & fixed. Prior exposure to hot  tubs but none recently.       Objective:   Physical Exam Blood pressure 136/82, pulse 58, height 5\' 6"  (1.676 m), weight 159 lb (72.122 kg), SpO2 94 %. General:  Awake. Alert. No acute distress.   Integument:  Warm & dry. No rash on exposed skin. No bruising. Lymphatics:  No appreciated cervical or supraclavicular lymphadenoapthy. HEENT:  Moist mucus membranes. No oral ulcers. No scleral injection or icterus. Mild bilateral nasal turbinate swelling with pale mucosa left greater than right. PERRL. Cardiovascular:  Regular rate. No edema. No appreciable JVD.  Pulmonary:  Initially had bilateral basilar end expiratory wheeze that cleared after cough. Good aeration & clear to auscultation bilaterally. Symmetric chest wall expansion. No accessory muscle use. Abdomen: Soft. Normal bowel sounds. Nondistended. Mild tender to palpation RUQ. Musculoskeletal:  Normal bulk and tone. Hand grip strength 4+/5 bilaterally. No joint deformity or effusion appreciated. Neurological:  CN 2-12 grossly in tact. No meningismus. Moving all 4 extremities equally. Symmetric patellar deep tendon reflexes. Psychiatric:  Mood and affect congruent. Speech normal rhythm, rate & tone.   IMAGING CXR PA/LAT 10/21/14 (personally reviewed by me): Upper thoracic spinal cord stimulator noted. Kyphosis noted. Hyperinflation with flattening of the diaphragms. No pleural effusion or thickening appreciated. No parenchymal opacity or nodule appreciated. Heart normal in size. Mediastinum normal in contour.  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 Remotely she was diagnosed with COPD puts unclear whether or not this was with pulmonary function testing or simply the suggestion of an on chest x-ray imaging. Certainly reviewing her chest x-ray there is flattening of the diaphragms consistent with hyperinflation. With her history of chronic bronchitis/frequent COPD exacerbations I feel it's reasonable to evaluate her immune system for potential immunoglobulin deficiencies. She does have a history of some seasonal allergies/allergic rhinitis as well. It sounds as though she is recovering from her recent COPD exacerbation/bronchitis given the change in color of her sputum but it still remains voluminous. I do question whether or not she is having reflux contributing to her ongoing cough and symptoms. I instructed the patient to notify my office if there is any change in her cough or she develops any new breathing problems before her next appointment.  1. Cough: Likely multifactorial. Checking serum RAST panel. Symptomatic treatment with Tessalon Perles. 2. COPD: Screening for alpha-1 antitrypsin deficiency. Checking full pulmonary function testing with bronchodilator challenge. 3. Ongoing tobacco use: Spelled over 3 minutes counseling the patient on the need to completely quit smoking. She has gradually tapering off cigarette use. Plan to readdress at next appointment as well. 4. Chronic bronchitis: Checking high-resolution CT scan of the chest without contrast to evaluate for bronchiectasis. Checking serum IgE & quantitative immunoglobulin panel. 5. GERD: Starting Zantac 150 milligrams by mouth daily at bedtime. Checking barium swallow. 6. Follow-up: Patient to return to clinic 1-2 months or sooner if needed.

## 2014-11-17 ENCOUNTER — Ambulatory Visit (HOSPITAL_COMMUNITY)
Admission: RE | Admit: 2014-11-17 | Discharge: 2014-11-17 | Disposition: A | Discharge status: Home / Self Care | Payer: Medicare Other | Source: Ambulatory Visit | Attending: Pulmonary Disease | Admitting: Pulmonary Disease

## 2014-11-17 DIAGNOSIS — K219 Gastro-esophageal reflux disease without esophagitis: Secondary | ICD-10-CM | POA: Insufficient documentation

## 2014-11-17 DIAGNOSIS — K224 Dyskinesia of esophagus: Secondary | ICD-10-CM | POA: Diagnosis not present

## 2014-11-18 ENCOUNTER — Ambulatory Visit (INDEPENDENT_AMBULATORY_CARE_PROVIDER_SITE_OTHER)
Admission: RE | Admit: 2014-11-18 | Discharge: 2014-11-18 | Disposition: A | Discharge status: Home / Self Care | Payer: Medicare Other | Source: Ambulatory Visit | Attending: Pulmonary Disease | Admitting: Pulmonary Disease

## 2014-11-18 DIAGNOSIS — R0600 Dyspnea, unspecified: Secondary | ICD-10-CM | POA: Diagnosis not present

## 2014-11-18 DIAGNOSIS — Z720 Tobacco use: Secondary | ICD-10-CM

## 2014-11-18 DIAGNOSIS — J9809 Other diseases of bronchus, not elsewhere classified: Secondary | ICD-10-CM | POA: Diagnosis not present

## 2014-11-18 DIAGNOSIS — J984 Other disorders of lung: Secondary | ICD-10-CM | POA: Diagnosis not present

## 2014-11-18 DIAGNOSIS — F17200 Nicotine dependence, unspecified, uncomplicated: Secondary | ICD-10-CM

## 2014-11-18 DIAGNOSIS — R05 Cough: Secondary | ICD-10-CM

## 2014-11-19 NOTE — Progress Notes (Signed)
Quick Note:  Spoke with pt and notified of results per Dr. Ashok Cordia. Pt verbalized understanding and denied any questions.  ______

## 2014-11-20 ENCOUNTER — Other Ambulatory Visit: Payer: Self-pay | Admitting: Internal Medicine

## 2014-11-26 DIAGNOSIS — G894 Chronic pain syndrome: Secondary | ICD-10-CM | POA: Diagnosis not present

## 2014-11-26 DIAGNOSIS — M5481 Occipital neuralgia: Secondary | ICD-10-CM | POA: Diagnosis not present

## 2014-12-08 ENCOUNTER — Ambulatory Visit: Payer: Self-pay | Admitting: Internal Medicine

## 2014-12-10 DIAGNOSIS — G894 Chronic pain syndrome: Secondary | ICD-10-CM | POA: Diagnosis not present

## 2014-12-10 DIAGNOSIS — M5481 Occipital neuralgia: Secondary | ICD-10-CM | POA: Diagnosis not present

## 2014-12-16 ENCOUNTER — Other Ambulatory Visit: Payer: Self-pay | Admitting: Internal Medicine

## 2014-12-22 DIAGNOSIS — M25551 Pain in right hip: Secondary | ICD-10-CM | POA: Diagnosis not present

## 2014-12-22 DIAGNOSIS — Z96651 Presence of right artificial knee joint: Secondary | ICD-10-CM | POA: Diagnosis not present

## 2014-12-22 DIAGNOSIS — Z471 Aftercare following joint replacement surgery: Secondary | ICD-10-CM | POA: Diagnosis not present

## 2014-12-26 ENCOUNTER — Encounter: Payer: Self-pay | Admitting: Pulmonary Disease

## 2014-12-26 ENCOUNTER — Ambulatory Visit (INDEPENDENT_AMBULATORY_CARE_PROVIDER_SITE_OTHER): Payer: Medicare Other | Admitting: Pulmonary Disease

## 2014-12-26 ENCOUNTER — Other Ambulatory Visit: Payer: Medicare Other

## 2014-12-26 VITALS — BP 140/90 | HR 52 | Ht 66.0 in | Wt 160.0 lb

## 2014-12-26 DIAGNOSIS — F17200 Nicotine dependence, unspecified, uncomplicated: Secondary | ICD-10-CM

## 2014-12-26 DIAGNOSIS — J432 Centrilobular emphysema: Secondary | ICD-10-CM

## 2014-12-26 DIAGNOSIS — J441 Chronic obstructive pulmonary disease with (acute) exacerbation: Secondary | ICD-10-CM | POA: Diagnosis not present

## 2014-12-26 DIAGNOSIS — J410 Simple chronic bronchitis: Secondary | ICD-10-CM | POA: Diagnosis not present

## 2014-12-26 DIAGNOSIS — K219 Gastro-esophageal reflux disease without esophagitis: Secondary | ICD-10-CM | POA: Diagnosis not present

## 2014-12-26 DIAGNOSIS — J449 Chronic obstructive pulmonary disease, unspecified: Secondary | ICD-10-CM

## 2014-12-26 DIAGNOSIS — J439 Emphysema, unspecified: Secondary | ICD-10-CM | POA: Insufficient documentation

## 2014-12-26 LAB — PULMONARY FUNCTION TEST
DL/VA % pred: 71 %
DL/VA: 3.62 ml/min/mmHg/L
DLCO unc % pred: 51 %
DLCO unc: 14 ml/min/mmHg
FEF 25-75 Pre: 0.43 L/sec
FEF2575-%PRED-PRE: 21 %
FEV1-%PRED-PRE: 32 %
FEV1-Pre: 0.78 L
FEV1FVC-%PRED-PRE: 84 %
FEV6-%Pred-Pre: 40 %
FEV6-PRE: 1.23 L
FEV6FVC-%Pred-Pre: 104 %
FVC-%Pred-Pre: 38 %
FVC-Pre: 1.23 L
PRE FEV1/FVC RATIO: 64 %
Pre FEV6/FVC Ratio: 100 %
RV % pred: 128 %
RV: 2.96 L
TLC % PRED: 92 %
TLC: 4.96 L

## 2014-12-26 MED ORDER — TIOTROPIUM BROMIDE MONOHYDRATE 2.5 MCG/ACT IN AERS
2.0000 | INHALATION_SPRAY | RESPIRATORY_TRACT | Status: DC
Start: 2014-12-26 — End: 2015-04-28

## 2014-12-26 NOTE — Progress Notes (Signed)
PFT done today. 

## 2014-12-26 NOTE — Progress Notes (Signed)
Subjective:    Patient ID: Destiny Hughes, female    DOB: 11-21-1945, 69 y.o.   MRN: [id number]  C.C.:  Follow-up for Very Severe COPD w/ Emphysema, Chronic Bronchitis, GERD, & Ongoing Tobacco Use.  HPI Very Severe COPD w/ Emphysema: Patient given Tessalon Perles at last appointment for symptomatic treatment. Patient found to have mild centrilobular & paraseptal emphysema on high-resolution CT scan. She reports the [employer] did seem to help with her cough significantly. She has also started to notice some wheezing as well. Continues to have dyspnea, especially on exertion. Currently taking Theophylline. Previously had breathing problems after albuterol neb.  Chronic Bronchitis:  No evidence for bronchiectasis on high-resolution CT scan. Likely secondary to underlying COPD & emphysema with ongoing tobacco use. She continues to cough up a "yellow" mucus. She has been on 2 separate courses of antibiotics since her last appointment.   GERD: Patient was started on Zantac daily at bedtime at last appointment. Patient had mild reflux and esophageal dysmotility without mass on barium swallow. She reports better control of her reflux & dyspepsia.   Ongoing Tobacco Use: At last appointment patient was going to taper off of cigarette use. She is still smoking 1/2 ppd.  Review of Systems No fever. Has chronic chills & sweats. She has chronic chest pressure that is unchanged. No new chest pain.   Allergies  Allergen Reactions  . Ventolin [Kdc:Albuterol] Anaphylaxis  . Albuterol     Had difficulty breathing after a nebulizer treatment  . Codeine Nausea And Vomiting  . Penicillins Swelling  . Ecotrin [Aspirin] Nausea And Vomiting   Current Outpatient Prescriptions on File Prior to Visit  Medication Sig Dispense Refill  . benzonatate (TESSALON PERLES) 100 MG capsule Take 1-2 capsules (100-200 mg total) by mouth 3 (three) times daily as needed for cough. 90 capsule 1  . Biotin (BIOTIN 5000)  5 MG CAPS Take 1 capsule by mouth daily.      . Cholecalciferol (VITAMIN D PO) Take 5,000 Units by mouth daily.    . diazepam (VALIUM) 5 MG tablet Take 5 mg by mouth every 6 (six) hours as needed (for muscle spams).     Marland Kitchen DIGOX 250 MCG tablet TAKE 1 TABLET BY MOUTH EVERY MORNING 90 tablet 0  . levothyroxine (SYNTHROID, LEVOTHROID) 150 MCG tablet Take 1 to 1&1/2 tablets daily as directed 135 tablet 1  . methadone (DOLOPHINE) 5 MG tablet Take 5 mg by mouth every 6 (six) hours.     Marland Kitchen OVER THE COUNTER MEDICATION OTC Flonase nasal spray PRN    . promethazine (PHENERGAN) 25 MG tablet Take 1 tablet 4 x daily with your pain meds 120 tablet 5  . propranolol (INDERAL) 80 MG tablet TAKE 1 TABLET BY MOUTH TWICE DAILY FOR BLOOD PRESSURE. 180 tablet 0  . ranitidine (ZANTAC) 150 MG tablet Take 1 tablet (150 mg total) by mouth at bedtime. 30 tablet 3  . sertraline (ZOLOFT) 100 MG tablet Take 150 mg by mouth every morning.     . theophylline (THEODUR) 200 MG 12 hr tablet Take 1 tablet (200 mg total) by mouth 2 (two) times daily. 60 tablet 2  . [DISCONTINUED] ferrous sulfate 325 (65 FE) MG tablet Take 1 tablet (325 mg total) by mouth 3 (three) times daily after meals.    . [DISCONTINUED] rivaroxaban (XARELTO) 10 MG TABS tablet Take 1 tablet (10 mg total) by mouth daily. 12 tablet    No current facility-administered medications on file prior to  visit.   Past Medical History  Diagnosis Date  . Palpitation   . Tachycardia   . Dyspnea   . Hyperlipemia   . Glaucoma   . Chronic neck pain   . Chronic back pain   . Collapse of right lung   . Hypothyroidism     tx. Levothyroxine  . Blood transfusion     after Thoracic surgery ?'04  . GERD (gastroesophageal reflux disease)     tx. TUMS  . Depression   . COPD (chronic obstructive pulmonary disease) (HCC)     Hx. Bronchitis occ, 01-25-11 somes issue now taking  Z-pak-started 01-24-11/Scar tissue  present  from previous lung collapse  . Heart murmur   .  Headache(784.0) 01-25-11    neck and nerve pain related.  . Arthritis 01-25-11    back and most joints  . Neuromuscular disorder ([place]) 01-25-11    Pain/nerve stimulator implanted -2 yrs ago.  Marland Kitchen Dysrhythmia     hx.PAT- tx. Inderal  . Nephrolithiasis   . Emphysema of lung [facility])    Past Surgical History  Procedure Laterality Date  . Spinal cord stimulator implant  2009 APPROX    DR. ELSNER  . Cervical spine surgery  01-25-11    2005-multiple levels  . Thoracic spine surgery  01-25-11    6'02- then nerve stimulator implanted after  . Tubal ligation    . Appendectomy  01-25-11  . Joint replacement  01-25-11    6'03 LTHA-hemi  . Knee arthroplasty  01-25-11    '04-right, revised x2  . Cholecystectomy  01-25-11    '07  . Carpal tunnel release  01-25-11    Bil.  . Total knee revision  02/01/2011    Procedure: TOTAL KNEE REVISION;  Surgeon: Mauri Pole;  Location: [facility];  Service: Orthopedics;  Laterality: Right;  . Colonoscopy with propofol N/A 07/23/2012    Procedure: COLONOSCOPY WITH PROPOFOL;  Surgeon: Garlan Fair, MD;  Location: [facility];  Service: Endoscopy;  Laterality: N/A;   Family History  Problem Relation Age of Onset  . Heart attack Father   . Emphysema Father   . Aneurysm Mother     CEREBRAL  . Emphysema Mother   . Diabetes Son   . Hypertension Son   . Hypertension Son   . Asthma Son     x2  . Breast cancer Paternal Aunt   . Rheum arthritis Maternal Aunt    Social History   Social History  . Marital Status: Married    Spouse Name: N/A  . Number of Children: 2  . Years of Education: N/A   Occupational History  . disabled    Social History Main Topics  . Smoking status: Current Every Day Smoker -- 0.50 packs/day for 50 years    Types: Cigarettes    Start date: 09/13/1961  . Smokeless tobacco: Never Used     Comment: Peak rate of 1ppd  . Alcohol Use: No     Comment: none in 10 years  . Drug Use: No  . Sexual Activity: No   Other Topics Concern    . None   Social History Narrative   She is from [facility]. She has always lived in [place]. She has traveled to [place], [place], [place], [place], & [place]. 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 Prior exposure to parakeets with last exposure remote  in her current home. Previously had mold in her home that was in her bathroom & fixed. Prior exposure to hot tubs but none recently.       Objective:   Physical Exam Blood pressure 140/90, pulse 52, height 5\' 6"  (1.676 m), weight 160 lb (72.576 kg), SpO2 94 %. General:  Awake. Alert. Accompanied by husband today.  Integument:  Warm & dry. No rash on exposed skin. No bruising. HEENT:  Moist mucus membranes. Minimal nasal turbinate swelling. No oral ulcers.  Cardiovascular:  Regular rate & rhythm. No edema. .  Pulmonary:  Faint end expiratory wheeze. Normal work of breathing on room air. Symmetric aeration. Musculoskeletal: Normal bulk and tone. No joint deformity appreciated. No joint effusion appreciated.  PFT 12/26/14: FVC 1.23 L (38%) FEV1 0.78 L (32%) FEV1/FVC 0.64 FEF 25-75 0.43 L (21%) TLC 4.96 L (92%) RV 128% ERV 27% DLCO uncorrected 51%  IMAGING HRCT CHEST W/O 11/18/14 (personally reviewed by me): No bronchiectasis or intralobular septal thickening. Diffuse bronchial wall thickening as well as mild centrilobular and paraseptal emphysema upper lobe predominant. Mild tracheobronchomalacia with air trapping. Atherosclerosis noted by radiologist. No mediastinal mass or pathologic adenopathy on my review. No pericardial effusion. No pleural effusion or thickening.  Barium Swallow (11/17/14): Mild esophageal dysmotility with mild GERD. No stricture, mass, or esophageal mucosal irregularity.  CXR PA/LAT 10/21/14 (previously reviewed by me): Upper thoracic spinal cord stimulator noted. Kyphosis noted. Hyperinflation with flattening of the diaphragms. No pleural  effusion or thickening appreciated. No parenchymal opacity or nodule appreciated. Heart normal in size. Mediastinum normal in contour.  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 Patient's high-resolution CT imaging results as well as spirometry from today were reviewed with her and her husband during visit today. Spirometry shows very severe airways obstruction with some air trapping consistent with her underlying emphysema seen on the high-resolution CT scan. We spent a lengthy amount of time today discussing the need for complete tobacco cessation to prevent worsening of her lung function. I am highly suspicious that her ongoing tobacco use with underlying COPD is what is truly contributing to her cough rather than an infectious etiology; although, I cannot completely rule this out as the patient does seem to get some symptomatic relief on antibiotic therapy. Zantac seems to have significantly improved her dyspepsia & reflux. I instructed the patient to contact my office if she had any clinical worsening today as she does seem to have a mild COPD exacerbation and declines steroid therapy at this time. Hopefully with the initiation of Spiriva her wheezing & symptoms will improve at least somewhat. I am somewhat skeptical about her prior "allergy" to albuterol and suspect that given her description of the sequence of events she was in the midst of a COPD exacerbation and tolerated a second course of albuterol nebulized at her primary care physician's office. I instructed her to contact my office if she had any further questions or concerns before her next appointment or experienced any clinical worsening.  1. Very severe COPD  with emphysema and mild exacerbation: Patient declines steroid therapy at this time. Currently prescribed theophylline. Starting Spiriva Respimat & instructed on proper technique by nursing staff. Plan to repeat spirometry at next appointment. Screening for alpha-1 antitrypsin deficiency.  2. GERD: Well controlled. Continuing patient on Zantac. 3. Chronic bronchitis: Checking quantitative immunoglobulin panel. Sputum culture for AFB, fungus, & bacteria. 4. Ongoing tobacco use: Spelled over 3 minutes counseling the patient on the need for complete tobacco cessation. 5. Follow-up: Patient to return to clinic in 2-3 months or sooner if needed.

## 2014-12-26 NOTE — Patient Instructions (Signed)
1. I'm starting you on Spiriva to help with your COPD. Please stop using the inhaler if you have any blurry vision or trouble urinating. Remember to seek immediate medical attention for any vision changes on this medication. 2. I'm checking your immune system as well as a culture of her sputum to see if there is a specific bug causing your cough. 3. I'm also screening you for the genetic form of COPD. 4. Please call me if her cough or wheezing gets worse. 5. Please keep trying to completely cut out the cigarette use. 6. Keep taking Zantac/ranitidine every day. 7. I will see back in 3 months or sooner if needed.

## 2014-12-29 ENCOUNTER — Telehealth: Payer: Self-pay | Admitting: Pulmonary Disease

## 2014-12-29 LAB — ALLERGY FULL PROFILE
Allergen,Goose feathers, e70: 0.11 kU/L — ABNORMAL HIGH
Aspergillus fumigatus, m3: <0.1 kU/L
Bahia Grass: <0.1 kU/L
Box Elder IgE: <0.1 kU/L
Common Ragweed: <0.1 kU/L
Dog Dander: <0.1 kU/L
Elm IgE: <0.1 kU/L
Fescue: <0.1 kU/L
G005 Rye, Perennial: <0.1 kU/L
Helminthosporium halodes: <0.1 kU/L
House Dust Hollister: <0.1 kU/L
IGE (IMMUNOGLOBULIN E), SERUM: 35 kU/L (ref <115)
Lamb's Quarters: <0.1 kU/L
Stemphylium Botryosum: <0.1 kU/L
Sycamore Tree: <0.1 kU/L
Timothy Grass: <0.1 kU/L

## 2014-12-29 LAB — IGE: IGE (IMMUNOGLOBULIN E), SERUM: 36 kU/L (ref <115)

## 2014-12-29 LAB — ALPHA-1-ANTITRYPSIN: A-1 Antitrypsin, Ser: 183 mg/dL (ref 83–199)

## 2014-12-29 NOTE — Telephone Encounter (Signed)
Spoke with pt, states that she is having difficulty getting Spiriva.  States her insurance does not cover this.  Pt is requesting an alt. Inhaler.   Pt uses [facility] on [address].    JN please advise.  Thanks!

## 2014-12-29 NOTE — Telephone Encounter (Signed)
Called spoke with pt. She will call her insurance to see what is covered and let us know. Will await call back

## 2014-12-29 NOTE — Telephone Encounter (Signed)
Will they cover Tudorza?

## 2014-12-30 DIAGNOSIS — M961 Postlaminectomy syndrome, not elsewhere classified: Secondary | ICD-10-CM | POA: Diagnosis not present

## 2014-12-30 DIAGNOSIS — G894 Chronic pain syndrome: Secondary | ICD-10-CM | POA: Diagnosis not present

## 2014-12-30 DIAGNOSIS — M5412 Radiculopathy, cervical region: Secondary | ICD-10-CM | POA: Diagnosis not present

## 2014-12-30 DIAGNOSIS — M5481 Occipital neuralgia: Secondary | ICD-10-CM | POA: Diagnosis not present

## 2014-12-30 LAB — IGG, IGA, IGM
IGM, SERUM: 42 mg/dL — AB (ref 52–322)
IgA: 345 mg/dL (ref 69–380)
IgG (Immunoglobin G), Serum: 1090 mg/dL (ref 690–1700)

## 2014-12-31 LAB — ALPHA-1 ANTITRYPSIN PHENOTYPE: A-1 Antitrypsin: 181 mg/dL (ref 83–199)

## 2014-12-31 MED ORDER — ACLIDINIUM BROMIDE 400 MCG/ACT IN AEPB
1.0000 | INHALATION_SPRAY | Freq: Two times a day (BID) | RESPIRATORY_TRACT | Status: DC
Start: 2014-12-31 — End: 2015-04-28

## 2014-12-31 NOTE — Telephone Encounter (Signed)
Let's try Caprice Renshaw one inhalation bid #1 inhaler #3 refills. JN

## 2014-12-31 NOTE — Telephone Encounter (Signed)
Rx for Tudorza sent to pharmacy. Patient notified.  Nothing further needed.

## 2014-12-31 NOTE — Telephone Encounter (Signed)
Called and spoke with patient, she said that she asked her insurance about Destiny Hughes and they told her that Dr. Ashok Cordia would have to call to get PA on Tudorza.  Dr. Ashok Cordia, please advise.

## 2015-01-02 ENCOUNTER — Telehealth: Payer: Self-pay | Admitting: *Deleted

## 2015-01-02 NOTE — Telephone Encounter (Signed)
Called to initiate PA for Destiny Hughes thru [facility] [phone]). They state must try and fail Atrovent HFA or Incruse Ellipta. Please advise.

## 2015-01-02 NOTE — Telephone Encounter (Signed)
Pt ID [id number].

## 2015-01-02 NOTE — Telephone Encounter (Signed)
Let go ahead and prescribe her Incruse then. One inhaler. One inhalation daily. 3 refills. Thanks.

## 2015-01-02 NOTE — Telephone Encounter (Signed)
Called pt and received busy signal x 3. wcb

## 2015-01-06 ENCOUNTER — Other Ambulatory Visit: Payer: Self-pay | Admitting: Pulmonary Disease

## 2015-01-06 ENCOUNTER — Other Ambulatory Visit: Payer: Medicare Other

## 2015-01-06 DIAGNOSIS — J449 Chronic obstructive pulmonary disease, unspecified: Secondary | ICD-10-CM

## 2015-01-06 DIAGNOSIS — M169 Osteoarthritis of hip, unspecified: Secondary | ICD-10-CM | POA: Diagnosis not present

## 2015-01-08 DIAGNOSIS — G894 Chronic pain syndrome: Secondary | ICD-10-CM | POA: Diagnosis not present

## 2015-01-08 DIAGNOSIS — M169 Osteoarthritis of hip, unspecified: Secondary | ICD-10-CM | POA: Diagnosis not present

## 2015-01-08 DIAGNOSIS — M5481 Occipital neuralgia: Secondary | ICD-10-CM | POA: Diagnosis not present

## 2015-01-08 DIAGNOSIS — Z79891 Long term (current) use of opiate analgesic: Secondary | ICD-10-CM | POA: Diagnosis not present

## 2015-01-09 LAB — RESPIRATORY CULTURE OR RESPIRATORY AND SPUTUM CULTURE
Culture: NORMAL
ORGANISM ID, BACTERIA: NORMAL

## 2015-01-14 MED ORDER — UMECLIDINIUM BROMIDE 62.5 MCG/INH IN AEPB: 1.0000 | INHALATION_SPRAY | Freq: Every day | RESPIRATORY_TRACT | Status: DC

## 2015-01-14 NOTE — Telephone Encounter (Signed)
I called spoke with pt and is aware incruse will be called in. she verbalized understanding. She is also requesting to have her tess pearls refilled. Please advise thanks

## 2015-01-15 MED ORDER — BENZONATATE 100 MG PO CAPS: 100.0000 mg | ORAL_CAPSULE | Freq: Three times a day (TID) | ORAL | Status: DC | PRN

## 2015-01-15 NOTE — Telephone Encounter (Signed)
RX hs been refilled. Nothing further needed

## 2015-01-15 NOTE — Telephone Encounter (Signed)
Go ahead and do a refill of the [employer] as well. Thanks.

## 2015-01-26 ENCOUNTER — Other Ambulatory Visit: Payer: Self-pay | Admitting: Physician Assistant

## 2015-02-03 LAB — FUNGUS CULTURE W SMEAR: SMEAR RESULT: NONE SEEN

## 2015-02-04 DIAGNOSIS — G5781 Other specified mononeuropathies of right lower limb: Secondary | ICD-10-CM | POA: Diagnosis not present

## 2015-02-04 DIAGNOSIS — Z96651 Presence of right artificial knee joint: Secondary | ICD-10-CM | POA: Diagnosis not present

## 2015-02-04 DIAGNOSIS — M25551 Pain in right hip: Secondary | ICD-10-CM | POA: Diagnosis not present

## 2015-02-04 DIAGNOSIS — Z471 Aftercare following joint replacement surgery: Secondary | ICD-10-CM | POA: Diagnosis not present

## 2015-02-05 DIAGNOSIS — M169 Osteoarthritis of hip, unspecified: Secondary | ICD-10-CM | POA: Diagnosis not present

## 2015-02-05 DIAGNOSIS — G894 Chronic pain syndrome: Secondary | ICD-10-CM | POA: Diagnosis not present

## 2015-02-05 DIAGNOSIS — M5481 Occipital neuralgia: Secondary | ICD-10-CM | POA: Diagnosis not present

## 2015-02-05 DIAGNOSIS — Z79891 Long term (current) use of opiate analgesic: Secondary | ICD-10-CM | POA: Diagnosis not present

## 2015-02-19 ENCOUNTER — Other Ambulatory Visit: Payer: Self-pay | Admitting: Physician Assistant

## 2015-02-19 LAB — AFB CULTURE WITH SMEAR (NOT AT ARMC): Acid Fast Smear: NONE SEEN

## 2015-02-26 ENCOUNTER — Telehealth: Payer: Self-pay | Admitting: Pulmonary Disease

## 2015-02-26 NOTE — Telephone Encounter (Signed)
MICROBIOLOGY Sputum (01/06/15):  Oral Flora / Candida albicans / AFB negative  LABS 12/26/14 IgG:  1090 IgA:  345 IgM:  42 IgE:  35 RAST Panel:  Goose [place]athers 0.11 (class 0/1) Alpha-1 AT:  MM (183)

## 2015-02-27 ENCOUNTER — Ambulatory Visit (INDEPENDENT_AMBULATORY_CARE_PROVIDER_SITE_OTHER): Payer: Medicare Other | Admitting: Pulmonary Disease

## 2015-02-27 ENCOUNTER — Encounter: Payer: Self-pay | Admitting: Pulmonary Disease

## 2015-02-27 VITALS — BP 122/64 | HR 60 | Ht 66.0 in | Wt 160.8 lb

## 2015-02-27 DIAGNOSIS — J438 Other emphysema: Secondary | ICD-10-CM | POA: Diagnosis not present

## 2015-02-27 NOTE — Patient Instructions (Addendum)
1.  Try using your Incruse Ellipta. One inhalation once daily. 2.  Call the [facility] ([phone]-QUITNOW) to see if they can help you with patches. 3.  I will see you back in 6 months or sooner if needed. Call me if you have any new breathing problems.

## 2015-02-27 NOTE — Progress Notes (Signed)
Subjective:    Patient ID: Destiny Hughes, female    DOB: 17-Jan-1946, 69 y.o.   MRN: [id number]  C.C.:  Follow-up for Very Severe COPD w/ Emphysema, Chronic Bronchitis, GERD, & Ongoing Tobacco Use.  HPI Very Severe COPD w/ Emphysema: Previously was taking Theophylline. She was unable to afford Spiriva or [place]. Previously prescribed Incruse Ellipta but hasn't started. She is afraid that the medication will "kill her". She continues to have wheezing & intermittent coughing. No antibiotics since last appointment or exacerbations.  Chronic Bronchitis:  No evidence for bronchiectasis on high-resolution CT scan. No evidence for atypical infection on sputum culture but reports it wasn't a good specimen. Continues to have a cough productive of a yellow or light gray mucus.  GERD: Previously prescribed Zantac. Reports good control of symptoms. No reflux, dyspepsia, or morning brash water taste.  Ongoing Tobacco Use:  Previously was smoking 1/2ppd. She reports now she continues to smoke 1/2ppd. Her husband continues to smoke as well.  Review of Systems No fever. Has chronic chills & sweats. She has chronic chest pressure that is unchanged. No new chest pain.   Allergies  Allergen Reactions  . Ventolin [Kdc:Albuterol] Anaphylaxis  . Albuterol     Had difficulty breathing after a nebulizer treatment  . Codeine Nausea And Vomiting  . Penicillins Swelling  . Ecotrin [Aspirin] Nausea And Vomiting   Current Outpatient Prescriptions on File Prior to Visit  Medication Sig Dispense Refill  . benzonatate (TESSALON PERLES) 100 MG capsule Take 1-2 capsules (100-200 mg total) by mouth 3 (three) times daily as needed for cough. 90 capsule 1  . Biotin (BIOTIN 5000) 5 MG CAPS Take 1 capsule by mouth daily.      . Cholecalciferol (VITAMIN D PO) Take 5,000 Units by mouth daily.    . diazepam (VALIUM) 5 MG tablet Take 5 mg by mouth every 6 (six) hours as needed (for muscle spams).     Marland Kitchen DIGOX 250 MCG  tablet TAKE 1 TABLET BY MOUTH EVERY MORNING 90 tablet 1  . levothyroxine (SYNTHROID, LEVOTHROID) 150 MCG tablet Take 1 to 1&1/2 tablets daily as directed 135 tablet 1  . methadone (DOLOPHINE) 5 MG tablet Take 5 mg by mouth every 6 (six) hours.     Marland Kitchen OVER THE COUNTER MEDICATION OTC Flonase nasal spray PRN    . Oxycodone HCl 20 MG TABS TK 1 T PO EVERY 6 HOURS AS NEEDED.  0  . promethazine (PHENERGAN) 25 MG tablet Take 1 tablet 4 x daily with your pain meds 120 tablet 5  . propranolol (INDERAL) 80 MG tablet TAKE 1 TABLET BY MOUTH TWICE DAILY FOR BLOOD PRESSURE. 180 tablet 0  . ranitidine (ZANTAC) 150 MG tablet Take 1 tablet (150 mg total) by mouth at bedtime. 30 tablet 3  . sertraline (ZOLOFT) 100 MG tablet Take 150 mg by mouth every morning.     . Aclidinium Bromide (TUDORZA PRESSAIR) 400 MCG/ACT AEPB Inhale 1 puff into the lungs 2 (two) times daily. (Patient not taking: Reported on 02/27/2015) 1 each 3  . Tiotropium Bromide Monohydrate (SPIRIVA RESPIMAT) 2.5 MCG/ACT AERS Inhale 2 puffs into the lungs every morning. (Patient not taking: Reported on 02/27/2015) 1 Inhaler 3  . Umeclidinium Bromide (INCRUSE ELLIPTA) 62.5 MCG/INH AEPB Inhale 1 puff into the lungs daily. (Patient not taking: Reported on 02/27/2015) 30 each 1  . [DISCONTINUED] ferrous sulfate 325 (65 FE) MG tablet Take 1 tablet (325 mg total) by mouth 3 (three) times daily after  meals.    . [DISCONTINUED] rivaroxaban (XARELTO) 10 MG TABS tablet Take 1 tablet (10 mg total) by mouth daily. 12 tablet    No current facility-administered medications on file prior to visit.   Past Medical History  Diagnosis Date  . Palpitation   . Tachycardia   . Dyspnea   . Hyperlipemia   . Glaucoma   . Chronic neck pain   . Chronic back pain   . Collapse of right lung   . Hypothyroidism     tx. Levothyroxine  . Blood transfusion     after Thoracic surgery ?'04  . GERD (gastroesophageal reflux disease)     tx. TUMS  . Depression   . COPD (chronic  obstructive pulmonary disease) (HCC)     Hx. Bronchitis occ, 01-25-11 somes issue now taking  Z-pak-started 01-24-11/Scar tissue  present  from previous lung collapse  . Heart murmur   . Headache(784.0) 01-25-11    neck and nerve pain related.  . Arthritis 01-25-11    back and most joints  . Neuromuscular disorder ([place]) 01-25-11    Pain/nerve stimulator implanted -2 yrs ago.  Marland Kitchen Dysrhythmia     hx.PAT- tx. Inderal  . Nephrolithiasis   . Emphysema of lung [facility])    Past Surgical History  Procedure Laterality Date  . Spinal cord stimulator implant  2009 APPROX    DR. ELSNER  . Cervical spine surgery  01-25-11    2005-multiple levels  . Thoracic spine surgery  01-25-11    6'02- then nerve stimulator implanted after  . Tubal ligation    . Appendectomy  01-25-11  . Joint replacement  01-25-11    6'03 LTHA-hemi  . Knee arthroplasty  01-25-11    '04-right, revised x2  . Cholecystectomy  01-25-11    '07  . Carpal tunnel release  01-25-11    Bil.  . Total knee revision  02/01/2011    Procedure: TOTAL KNEE REVISION;  Surgeon: Mauri Pole;  Location: [facility];  Service: Orthopedics;  Laterality: Right;  . Colonoscopy with propofol N/A 07/23/2012    Procedure: COLONOSCOPY WITH PROPOFOL;  Surgeon: Garlan Fair, MD;  Location: [facility];  Service: Endoscopy;  Laterality: N/A;   Family History  Problem Relation Age of Onset  . Heart attack Father   . Emphysema Father   . Aneurysm Mother     CEREBRAL  . Emphysema Mother   . Diabetes Son   . Hypertension Son   . Hypertension Son   . Asthma Son     x2  . Breast cancer Paternal Aunt   . Rheum arthritis Maternal Aunt    Social History   Social History  . Marital Status: Married    Spouse Name: N/A  . Number of Children: 2  . Years of Education: N/A   Occupational History  . disabled    Social History Main Topics  . Smoking status: Current Every Day Smoker -- 0.50 packs/day for 50 years    Types: Cigarettes    Start date:  09/13/1961  . Smokeless tobacco: Never Used     Comment: Peak rate of 1ppd  . Alcohol Use: No     Comment: none in 10 years  . Drug Use: No  . Sexual Activity: No   Other Topics Concern  . None   Social History Narrative   She is from [facility]. She has always lived in [place]. She has traveled to [place], [place], [place], [place], & [place]. 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 Prior exposure to parakeets with last exposure remote in her current home. Previously had mold in her home that was in her bathroom & fixed. Prior exposure to hot tubs but none recently.       Objective:   Physical Exam BP 122/64 mmHg  Pulse 60  Ht 5\' 6"  (1.676 m)  Wt 160 lb 12.8 oz (72.938 kg)  BMI 25.97 kg/m2  SpO2 92% General:  Awake. Alert. No distress. Accompanied by husband today.  Integument:  Warm & dry. No rash on exposed skin. No bruising. HEENT:  Moist mucus membranes. Moderate bilateral nasal turbinate swelling. No oral ulcers.  Cardiovascular:  Regular rate & rhythm. No edema. .  Pulmonary:  Faint end expiratory wheeze predominantly apical. No accessory muscle use on room air. Musculoskeletal: Normal bulk and tone. No joint deformity appreciated. No joint effusion appreciated.  PFT 12/26/14: FVC 1.23 L (38%) FEV1 0.78 L (32%) FEV1/FVC 0.64 FEF 25-75 0.43 L (21%) TLC 4.96 L (92%) RV 128% ERV 27% DLCO uncorrected 51%  IMAGING HRCT CHEST W/O 11/18/14 (previously reviewed by me): No bronchiectasis or intralobular septal thickening. Diffuse bronchial wall thickening as well as mild centrilobular and paraseptal emphysema upper lobe predominant. Mild tracheobronchomalacia with air trapping. Atherosclerosis noted by radiologist. No mediastinal mass or pathologic adenopathy on my review. No pericardial effusion. No pleural effusion or thickening.  Barium Swallow (11/17/14): Mild esophageal dysmotility with mild  GERD. No stricture, mass, or esophageal mucosal irregularity.  CXR PA/LAT 10/21/14 (previously reviewed by me): Upper thoracic spinal cord stimulator noted. Kyphosis noted. Hyperinflation with flattening of the diaphragms. No pleural effusion or thickening appreciated. No parenchymal opacity or nodule appreciated. Heart normal in size. Mediastinum normal in contour.  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 Continuing to have intermittent coughing but also continuing to smoke tobacco. Patient reluctant to use inhaled medication therapy. Spent a significant amount time today discussing the pathophysiology of worsening COPD with ongoing tobacco use. Reviewed her serum lab tests from last appointment which shows no evidence for immunoglobulin deficiency or alpha-1 antitrypsin deficiency. Reflux seems to be reasonably controlled at this time. Requested the patient contact my office for any new breathing problems before next appointment.  1. Very severe COPD with emphysema: Encouraged patient to use Incruse Ellipta as prescribed. Repeat spirometry with DLCO at next appointment. 2. GERD: Well controlled. Continuing patient on Zantac unchanged. 3. Chronic bronchitis: No evidence for immunoglobulin deficiency or alpha-1 antitrypsin deficiency. Likely secondary to ongoing tobacco use. 4. Ongoing tobacco use: Spentver 3 minutes counseling the patient on the need for complete tobacco cessation. Recommended the [place] Quit Now Hotline. 5. Health maintenance: Declines immunization.  6. Follow-up: return to  clinic in 6 months or sooner if needed.

## 2015-03-02 DIAGNOSIS — H04123 Dry eye syndrome of bilateral lacrimal glands: Secondary | ICD-10-CM | POA: Diagnosis not present

## 2015-03-02 DIAGNOSIS — H01024 Squamous blepharitis left upper eyelid: Secondary | ICD-10-CM | POA: Diagnosis not present

## 2015-03-02 DIAGNOSIS — H40013 Open angle with borderline findings, low risk, bilateral: Secondary | ICD-10-CM | POA: Diagnosis not present

## 2015-03-02 DIAGNOSIS — H25813 Combined forms of age-related cataract, bilateral: Secondary | ICD-10-CM | POA: Diagnosis not present

## 2015-03-02 DIAGNOSIS — H01022 Squamous blepharitis right lower eyelid: Secondary | ICD-10-CM | POA: Diagnosis not present

## 2015-03-02 DIAGNOSIS — H01021 Squamous blepharitis right upper eyelid: Secondary | ICD-10-CM | POA: Diagnosis not present

## 2015-03-02 DIAGNOSIS — H01025 Squamous blepharitis left lower eyelid: Secondary | ICD-10-CM | POA: Diagnosis not present

## 2015-03-03 DIAGNOSIS — M25561 Pain in right knee: Secondary | ICD-10-CM | POA: Diagnosis not present

## 2015-03-05 DIAGNOSIS — M5412 Radiculopathy, cervical region: Secondary | ICD-10-CM | POA: Diagnosis not present

## 2015-03-05 DIAGNOSIS — G894 Chronic pain syndrome: Secondary | ICD-10-CM | POA: Diagnosis not present

## 2015-03-05 DIAGNOSIS — M5481 Occipital neuralgia: Secondary | ICD-10-CM | POA: Diagnosis not present

## 2015-03-05 DIAGNOSIS — M961 Postlaminectomy syndrome, not elsewhere classified: Secondary | ICD-10-CM | POA: Diagnosis not present

## 2015-03-16 ENCOUNTER — Other Ambulatory Visit: Payer: Self-pay | Admitting: Internal Medicine

## 2015-03-22 ENCOUNTER — Other Ambulatory Visit: Payer: Self-pay | Admitting: Internal Medicine

## 2015-04-07 DIAGNOSIS — M961 Postlaminectomy syndrome, not elsewhere classified: Secondary | ICD-10-CM | POA: Diagnosis not present

## 2015-04-07 DIAGNOSIS — Z79891 Long term (current) use of opiate analgesic: Secondary | ICD-10-CM | POA: Diagnosis not present

## 2015-04-07 DIAGNOSIS — M791 Myalgia: Secondary | ICD-10-CM | POA: Diagnosis not present

## 2015-04-07 DIAGNOSIS — G894 Chronic pain syndrome: Secondary | ICD-10-CM | POA: Diagnosis not present

## 2015-04-07 DIAGNOSIS — M5412 Radiculopathy, cervical region: Secondary | ICD-10-CM | POA: Diagnosis not present

## 2015-04-15 ENCOUNTER — Other Ambulatory Visit: Payer: Self-pay | Admitting: Internal Medicine

## 2015-04-20 ENCOUNTER — Telehealth: Payer: Self-pay | Admitting: *Deleted

## 2015-04-20 ENCOUNTER — Other Ambulatory Visit: Payer: Self-pay | Admitting: Internal Medicine

## 2015-04-20 DIAGNOSIS — I10 Essential (primary) hypertension: Secondary | ICD-10-CM

## 2015-04-20 MED ORDER — ATENOLOL 100 MG PO TABS: ORAL_TABLET | ORAL | Status: DC

## 2015-04-20 NOTE — Telephone Encounter (Signed)
Per Dr Melford Aase, and at the request of her insurance plan,  the patient's Propranolol 80 mg BID has been changed to Atenolol 100 mg, which the patient will take 1/2 tab BID.  Patient is aware of the change and will discuss at her upcoming OV.

## 2015-04-21 DIAGNOSIS — M25561 Pain in right knee: Secondary | ICD-10-CM | POA: Diagnosis not present

## 2015-04-28 ENCOUNTER — Ambulatory Visit (INDEPENDENT_AMBULATORY_CARE_PROVIDER_SITE_OTHER): Payer: Medicare Other | Admitting: Internal Medicine

## 2015-04-28 ENCOUNTER — Encounter: Payer: Self-pay | Admitting: Internal Medicine

## 2015-04-28 VITALS — BP 124/66 | HR 86 | Temp 97.5°F | Resp 16 | Ht 66.0 in | Wt 157.6 lb

## 2015-04-28 DIAGNOSIS — E785 Hyperlipidemia, unspecified: Secondary | ICD-10-CM

## 2015-04-28 DIAGNOSIS — Z79899 Other long term (current) drug therapy: Secondary | ICD-10-CM

## 2015-04-28 DIAGNOSIS — I10 Essential (primary) hypertension: Secondary | ICD-10-CM

## 2015-04-28 DIAGNOSIS — Z1331 Encounter for screening for depression: Secondary | ICD-10-CM

## 2015-04-28 DIAGNOSIS — Z1389 Encounter for screening for other disorder: Secondary | ICD-10-CM

## 2015-04-28 DIAGNOSIS — R7309 Other abnormal glucose: Secondary | ICD-10-CM | POA: Diagnosis not present

## 2015-04-28 DIAGNOSIS — J449 Chronic obstructive pulmonary disease, unspecified: Secondary | ICD-10-CM

## 2015-04-28 DIAGNOSIS — E039 Hypothyroidism, unspecified: Secondary | ICD-10-CM | POA: Diagnosis not present

## 2015-04-28 DIAGNOSIS — E559 Vitamin D deficiency, unspecified: Secondary | ICD-10-CM | POA: Diagnosis not present

## 2015-04-28 DIAGNOSIS — R7303 Prediabetes: Secondary | ICD-10-CM | POA: Diagnosis not present

## 2015-04-28 LAB — CBC WITH DIFFERENTIAL/PLATELET
Basophils Absolute: 0 10*3/uL (ref 0.0–0.1)
Basophils Relative: 0 % (ref 0–1)
Eosinophils Absolute: 0.2 10*3/uL (ref 0.0–0.7)
Eosinophils Relative: 2 % (ref 0–5)
HCT: 49.3 % — ABNORMAL HIGH (ref 36.0–46.0)
HEMOGLOBIN: 16.2 g/dL — AB (ref 12.0–15.0)
LYMPHS ABS: 2.3 10*3/uL (ref 0.7–4.0)
Lymphocytes Relative: 27 % (ref 12–46)
MCH: 29.4 pg (ref 26.0–34.0)
MCHC: 32.9 g/dL (ref 30.0–36.0)
MCV: 89.5 fL (ref 78.0–100.0)
MONOS PCT: 6 % (ref 3–12)
MPV: 10.3 fL (ref 8.6–12.4)
Monocytes Absolute: 0.5 10*3/uL (ref 0.1–1.0)
NEUTROS ABS: 5.6 10*3/uL (ref 1.7–7.7)
NEUTROS PCT: 65 % (ref 43–77)
Platelets: 280 10*3/uL (ref 150–400)
RBC: 5.51 MIL/uL — ABNORMAL HIGH (ref 3.87–5.11)
RDW: 14.6 % (ref 11.5–15.5)
WBC: 8.6 10*3/uL (ref 4.0–10.5)

## 2015-04-28 LAB — LIPID PANEL
CHOLESTEROL: 195 (ref 0–200)
HDL: 43 (ref 35–70)
LDL CALC: 114
TRIGLYCERIDES: 188 — AB (ref 40–160)

## 2015-04-28 LAB — HEMOGLOBIN A1C: HEMOGLOBIN A1C: 6.4

## 2015-04-28 LAB — TSH: TSH: 1.04 (ref <=5.90)

## 2015-04-28 MED ORDER — FLUTICASONE FUROATE-VILANTEROL 100-25 MCG/INH IN AEPB: INHALATION_SPRAY | RESPIRATORY_TRACT | Status: DC

## 2015-04-28 NOTE — Patient Instructions (Signed)

## 2015-04-28 NOTE — Progress Notes (Deleted)
Patient ID: Destiny Hughes, female   DOB: Jul 14, 1945, 70 y.o.   MRN: [id number]   This very nice 70 y.o. MWF presents for late follow up with Hypertension, Hyperlipidemia, Pre-Diabetes and Vitamin D Deficiency. Patient does have COPD and continues to smoke and recently has been followed by Dr Nestor/Pulmonary. Patient has chronic neck & low back pain & has a spinal cord stimulator & is followed by Dr Elta Guadeloupe Hardin Negus for her pain meds. Patient seems to have difficulty maintaining a focused train of thought and is probably due to the pain meds that she is taking.    Patient is treated for HTN for over 15 + year and has hx/o MVP and pSVT in the past  & BP has been controlled and today's BP: 124/66 mmHg. Patient has had no complaints of any cardiac type chest pain, palpitations, dyspnea/orthopnea/PND, dizziness, claudication, or dependent edema.   Hyperlipidemia is not controlled with diet. Last Lipids were not at goal with  Cholesterol 218*; HDL 40*; LDL 126*; Triglycerides 258 on 05/21/2014.   Also, the patient has history of T2_NIDDM with A1c 6.0% in 2011, 6.5% in 2015 and has had no symptoms of reactive hypoglycemia, diabetic polys, paresthesias or visual blurring.  Last A1c was  6.3% on 05/21/2014.    Further, the patient also has history of Vitamin D Deficiency of "30" in 2013  and supplements vitamin D without any suspected side-effects. Last vitamin D was  35 on 05/21/2014.  Medication Sig  . Biotin (BIOTIN 5000) 5 MG CAPS Take 1 capsule by mouth daily.    . Cholecalciferol (VITAMIN D PO) Take 5,000 Units by mouth daily.  . diazepam (VALIUM) 5 MG tablet Take 5 mg by mouth every 6 (six) hours as needed (for muscle spams).   Marland Kitchen DIGOXIN  250 MCG tablet TAKE 1 TABLET BY MOUTH EVERY MORNING  . Levothyroxine 150 MCG tablet  Takes 1 tab 5 x/wk and  1&1/2 tab 2 x/wk TTh  . methadone (DOLOPHINE) 5 MG tablet Take 5 mg  every 6  hours.   Marland Kitchen OVER THE COUNTER MEDICATION OTC Flonase nasal spray PRN  . Oxycodone  HCl 20 MG TABS TK 1 T PO EVERY 6 HOURS AS NEEDED.  Marland Kitchen sertraline (ZOLOFT) 100 MG tablet Take 150 mg by mouth every morning.   . ranitidine (ZANTAC) 150 MG tablet Take 1 tablet (150 mg total) by mouth at bedtime.  Marland Kitchen atenolol (TENORMIN) 100 MG tablet Take 1 tablet daily for BP (Patient not taking: Reported on 04/28/2015)   No facility-administered medications prior to visit.    Allergies  Allergen Reactions  . Ventolin [Kdc:Albuterol] Anaphylaxis  . Albuterol     Had difficulty breathing after a nebulizer treatment  . Codeine Nausea And Vomiting  . Penicillins Swelling  . Ecotrin [Aspirin] Nausea And Vomiting    PMHx:   Past Medical History  Diagnosis Date  . Palpitation   . Tachycardia   . Dyspnea   . Hyperlipemia   . Glaucoma   . Chronic neck pain   . Chronic back pain   . Collapse of right lung   . Hypothyroidism     tx. Levothyroxine  . Blood transfusion     after Thoracic surgery ?'04  . GERD (gastroesophageal reflux disease)     tx. TUMS  . Depression   . COPD (chronic obstructive pulmonary disease) (HCC)     Hx. Bronchitis occ, 01-25-11 somes issue now taking  Z-pak-started 01-24-11/Scar tissue  present  from previous  lung collapse  . Heart murmur   . Headache(784.0) 01-25-11    neck and nerve pain related.  . Arthritis 01-25-11    back and most joints  . Neuromuscular disorder ([place]) 01-25-11    Pain/nerve stimulator implanted -2 yrs ago.  Marland Kitchen Dysrhythmia     hx.PAT- tx. Inderal  . Nephrolithiasis   . Emphysema of lung ([place])    Immunization History  Administered Date(s) Administered  . Pneumococcal Conjugate-13 10/03/2014  . Pneumococcal Polysaccharide-23 10/18/2011  . Td 03/21/2010   Past Surgical History  Procedure Laterality Date  . Spinal cord stimulator implant  2009 APPROX    DR. ELSNER  . Cervical spine surgery  01-25-11    2005-multiple levels  . Thoracic spine surgery  01-25-11    6'02- then nerve stimulator implanted after  . Tubal ligation    .  Appendectomy  01-25-11  . Joint replacement  01-25-11    6'03 LTHA-hemi  . Knee arthroplasty  01-25-11    '04-right, revised x2  . Cholecystectomy  01-25-11    '07  . Carpal tunnel release  01-25-11    Bil.  . Total knee revision  02/01/2011    Procedure: TOTAL KNEE REVISION;  Surgeon: Mauri Pole;  Location: [facility];  Service: Orthopedics;  Laterality: Right;  . Colonoscopy with propofol N/A 07/23/2012    Procedure: COLONOSCOPY WITH PROPOFOL;  Surgeon: Garlan Fair, MD;  Location: [facility];  Service: Endoscopy;  Laterality: N/A;   FHx:    Reviewed / unchanged  SHx:    Reviewed / unchanged  Systems Review:  Constitutional: Denies fever, chills, wt changes, headaches, insomnia, fatigue, night sweats, change in appetite. Eyes: Denies redness, blurred vision, diplopia, discharge, itchy, watery eyes.  ENT: Denies discharge, congestion, post nasal drip, epistaxis, sore throat, earache, hearing loss, dental pain, tinnitus, vertigo, sinus pain, snoring.  CV: Denies chest pain, palpitations, irregular heartbeat, syncope, dyspnea, diaphoresis, orthopnea, PND, claudication or edema. Respiratory: denies cough, dyspnea, DOE, pleurisy, hoarseness, laryngitis, wheezing.  Gastrointestinal: Denies dysphagia, odynophagia, heartburn, reflux, water brash, abdominal pain or cramps, nausea, vomiting, bloating, diarrhea, constipation, hematemesis, melena, hematochezia  or hemorrhoids. Genitourinary: Denies dysuria, frequency, urgency, nocturia, hesitancy, discharge, hematuria or flank pain. Musculoskeletal: Denies arthralgias, myalgias, stiffness, jt. swelling, pain, limping or strain/sprain.  Skin: Denies pruritus, rash, hives, warts, acne, eczema or change in skin lesion(s). Neuro: No weakness, tremor, incoordination, spasms, paresthesia or pain. Psychiatric: Denies confusion, memory loss or sensory loss. Endo: Denies change in weight, skin or hair change.  Heme/Lymph: No excessive bleeding, bruising  or enlarged lymph nodes.  Physical Exam  BP 124/66 mmHg  Pulse 86  Temp(Src) 97.5 F (36.4 C)  Resp 16  Ht 5\' 6"  (1.676 m)  Wt 157 lb 9.6 oz (71.487 kg)  BMI 25.45 kg/m2  SpO2 95%  Appears well nourished and in no distress. Eyes: PERRLA, EOMs, conjunctiva no swelling or erythema. Sinuses: No frontal/maxillary tenderness ENT/Mouth: EAC's clear, TM's nl w/o erythema, bulging. Nares clear w/o erythema, swelling, exudates. Oropharynx clear without erythema or exudates. Oral hygiene is good. Tongue normal, non obstructing. Hearing intact.  Neck: Supple. Thyroid nl. Car 2+/2+ without bruits, nodes or JVD. Chest: Decreased BS with inspiratory wheezes and few scattered rales/rhonchiCor: Heart sounds normal w/ regular rate and rhythm without sig. murmurs, gallops, clicks, or rubs. Peripheral pulses normal and equal  without edema.  Abdomen: Soft & bowel sounds normal. Non-tender w/o guarding, rebound, hernias, masses, or organomegaly.  Lymphatics: Unremarkable.  Musculoskeletal: Full ROM all  peripheral extremities, joint stability, 5/5 strength, and normal gait.  Skin: Warm, dry without exposed rashes, lesions or ecchymosis apparent.  Neuro: Cranial nerves intact, reflexes equal bilaterally. Sensory-motor testing grossly intact. Tendon reflexes grossly intact.  Pysch: Alert & oriented x 3.  Insight and judgement nl & appropriate. No ideations.  Assessment and Plan:  1. Essential hypertension ***  2. Hyperlipemia *** - Lipid panel  3. Prediabetes *** - Hemoglobin A1c - Insulin, random  4. Vitamin D deficiency *** - VITAMIN D 25 Hydroxy (Vit-D Deficiency, Fractures); Future - VITAMIN D 25 Hydroxy (Vit-D Deficiency, Fractures)  5. Other abnormal glucose *** - Hemoglobin A1c - Insulin, random  6. Hypothyroidism, unspecified hypothyroidism type *** - TSH  7. Chronic obstructive pulmonary disease, unspecified COPD type (HCC) *** - Fluticasone Furoate-Vilanterol (BREO  ELLIPTA) 100-25 MCG/INH AEPB; 1 inhalation daily Sx's # 28 dose til sees Dr Ashok Cordia  8. Medication management *** - CBC with Differential/Platelet - BASIC METABOLIC PANEL WITH GFR - Hepatic function panel - Magnesium - Digoxin level      Recommended regular exercise, BP monitoring, weight control, and discussed med and SE's. Recommended labs to assess and monitor clinical status. Further disposition pending results of labs. Over 30 minutes of exam, counseling, chart review was performed    Subjective:    Patient ID: Destiny Hughes, female    DOB: Nov 25, 1945, 71 y.o.   MRN: [id number]  HPI  Patient c/o head & chest congestion x 2 weeks with yellowish purulent sinus drainage and sputum. No fever chills or SOB.   Medication Sig  . Biotin (BIOTIN 5000) 5 MG CAPS Take 1 capsule by mouth daily.    . Cholecalciferol (VITAMIN D PO) Take 5,000 Units by mouth daily.  . diazepam (VALIUM) 5 MG tablet Take 5 mg by mouth every 6 (six) hours as needed (for muscle spams).   Marland Kitchen DIGOX 250 MCG tablet TAKE 1 TABLET BY MOUTH EVERY MORNING  . levothyroxine (SYNTHROID, LEVOTHROID) 150 MCG tablet TAKE 1 TABLET BY MOUTH EVERY DAY 30 TO 60 MINUTES BEFORE EATING AND TAKE 1.5 TABLETS BY MOUTH ON TUESDAY THURSDAY  . methadone (DOLOPHINE) 5 MG tablet Take 5 mg by mouth every 6 (six) hours.   . promethazine (PHENERGAN) 25 MG tablet Take 1 tablet 4 x daily with your pain meds  . propranolol (INDERAL) 80 MG tablet TAKE 1 TABLET BY MOUTH TWICE DAILY FOR BLOOD PRESSURE  . sertraline (ZOLOFT) 100 MG tablet Take 150 mg by mouth every morning.   . Oxycodone HCl 20 MG TABS Take by mouth. Takes 20 mg by mouth every 6 hours as needed for pain.  . theophylline (THEO-24) 200 MG 24 hr capsule Take 200 mg by mouth 2 (two) times daily.   Allergies  Allergen Reactions  . Ventolin [Kdc:Albuterol] Anaphylaxis  . Albuterol     Had difficulty breathing after a nebulizer treatment  . Codeine Nausea And Vomiting  .  Penicillins Swelling  . Ecotrin [Aspirin] Nausea And Vomiting   Past Medical History  Diagnosis Date  . Palpitation   . Tachycardia   . Dyspnea   . Hyperlipemia   . Glaucoma   . Chronic neck pain   . Chronic back pain   . Collapse of right lung   . Hypothyroidism     tx. Levothyroxine  . Blood transfusion     after Thoracic surgery ?'04  . GERD (gastroesophageal reflux disease)     tx. TUMS  . Depression   . COPD (chronic  obstructive pulmonary disease) (HCC)     Hx. Bronchitis occ, 01-25-11 somes issue now taking  Z-pak-started 01-24-11/Scar tissue  present  from previous lung collapse  . Heart murmur   . Headache(784.0) 01-25-11    neck and nerve pain related.  . Arthritis 01-25-11    back and most joints  . Neuromuscular disorder ([place]) 01-25-11    Pain/nerve stimulator implanted -2 yrs ago.  Marland Kitchen Dysrhythmia     hx.PAT- tx. Inderal  . Nephrolithiasis   . Emphysema of lung ([place])    Review of Systems 10 point systems review negative except as above.     Physical Exam

## 2015-04-28 NOTE — Progress Notes (Signed)
Patient ID: Destiny Hughes, female   DOB: 1945/09/20, 70 y.o.   MRN: [id number]  This very nice 70 y.o. MWF presents for late follow up with Hypertension, Hyperlipidemia, Pre-Diabetes and Vitamin D Deficiency. Patient does have COPD and continues to smoke and recently has been followed by Dr Nestor/Pulmonary. Patient has chronic neck & low back pain & has a spinal cord stimulator & is followed by Dr Elta Guadeloupe Hardin Negus for her pain meds. Patient seems to have difficulty maintaining a focused train of thought and is probably due to the pain meds that she is taking.    Patient is treated for HTN for over 15 + year and has hx/o MVP and pSVT in the past  & BP has been controlled and today's BP: 124/66 mmHg. Patient has had no complaints of any cardiac type chest pain, palpitations, dyspnea/orthopnea/PND, dizziness, claudication, or dependent edema.   Hyperlipidemia is not controlled with diet. Last Lipids were not at goal with  Cholesterol 218*; HDL 40*; LDL 126*; Triglycerides 258 on 05/21/2014.   Also, the patient has history of T2_NIDDM with A1c 6.0% in 2011, 6.5% in 2015 and has had no symptoms of reactive hypoglycemia, diabetic polys, paresthesias or visual blurring.  Last A1c was  6.3% on 05/21/2014.    Further, the patient also has history of Vitamin D Deficiency of "30" in 2013  and supplements vitamin D without any suspected side-effects. Last vitamin D was  35 on 05/21/2014.  Medication Sig  . Biotin (BIOTIN 5000) 5 MG CAPS Take 1 capsule by mouth daily.    . Cholecalciferol (VITAMIN D PO) Take 5,000 Units by mouth daily.  . diazepam (VALIUM) 5 MG tablet Take 5 mg by mouth every 6 (six) hours as needed (for muscle spams).   Marland Kitchen DIGOXIN  250 MCG tablet TAKE 1 TABLET BY MOUTH EVERY MORNING  . Levothyroxine 150 MCG tablet  Takes 1 tab 5 x/wk and  1&1/2 tab 2 x/wk TTh  . methadone (DOLOPHINE) 5 MG tablet Take 5 mg  every 6  hours.   Marland Kitchen OVER THE COUNTER MEDICATION OTC Flonase nasal spray PRN  . Oxycodone  HCl 20 MG TABS TK 1 T PO EVERY 6 HOURS AS NEEDED.  Marland Kitchen sertraline (ZOLOFT) 100 MG tablet Take 150 mg by mouth every morning.   . ranitidine (ZANTAC) 150 MG tablet Take 1 tablet (150 mg total) by mouth at bedtime.  Marland Kitchen atenolol (TENORMIN) 100 MG tablet Take 1 tablet daily for BP (Patient not taking: Reported on 04/28/2015)   No facility-administered medications prior to visit.    Allergies  Allergen Reactions  . Ventolin [Kdc:Albuterol] Anaphylaxis  . Albuterol     Had difficulty breathing after a nebulizer treatment  . Codeine Nausea And Vomiting  . Penicillins Swelling  . Ecotrin [Aspirin] Nausea And Vomiting    PMHx:   Past Medical History  Diagnosis Date  . Palpitation   . Tachycardia   . Dyspnea   . Hyperlipemia   . Glaucoma   . Chronic neck pain   . Chronic back pain   . Collapse of right lung   . Hypothyroidism     tx. Levothyroxine  . Blood transfusion     after Thoracic surgery ?'04  . GERD (gastroesophageal reflux disease)     tx. TUMS  . Depression   . COPD (chronic obstructive pulmonary disease) (HCC)     Hx. Bronchitis occ, 01-25-11 somes issue now taking  Z-pak-started 01-24-11/Scar tissue  present  from previous lung  collapse  . Heart murmur   . Headache(784.0) 01-25-11    neck and nerve pain related.  . Arthritis 01-25-11    back and most joints  . Neuromuscular disorder ([place]) 01-25-11    Pain/nerve stimulator implanted -2 yrs ago.  Marland Kitchen Dysrhythmia     hx.PAT- tx. Inderal  . Nephrolithiasis   . Emphysema of lung ([place])    Immunization History  Administered Date(s) Administered  . Pneumococcal Conjugate-13 10/03/2014  . Pneumococcal Polysaccharide-23 10/18/2011  . Td 03/21/2010   Past Surgical History  Procedure Laterality Date  . Spinal cord stimulator implant  2009 APPROX    DR. ELSNER  . Cervical spine surgery  01-25-11    2005-multiple levels  . Thoracic spine surgery  01-25-11    6'02- then nerve stimulator implanted after  . Tubal ligation    .  Appendectomy  01-25-11  . Joint replacement  01-25-11    6'03 LTHA-hemi  . Knee arthroplasty  01-25-11    '04-right, revised x2  . Cholecystectomy  01-25-11    '07  . Carpal tunnel release  01-25-11    Bil.  . Total knee revision  02/01/2011    Procedure: TOTAL KNEE REVISION;  Surgeon: Mauri Pole;  Location: [facility];  Service: Orthopedics;  Laterality: Right;  . Colonoscopy with propofol N/A 07/23/2012    Procedure: COLONOSCOPY WITH PROPOFOL;  Surgeon: Garlan Fair, MD;  Location: [facility];  Service: Endoscopy;  Laterality: N/A;   FHx:    Reviewed / unchanged  SHx:    Reviewed / unchanged  Systems Review:  Constitutional: Denies fever, chills, wt changes, headaches, insomnia, fatigue, night sweats, change in appetite. Eyes: Denies redness, blurred vision, diplopia, discharge, itchy, watery eyes.  ENT: Denies discharge, congestion, post nasal drip, epistaxis, sore throat, earache, hearing loss, dental pain, tinnitus, vertigo, sinus pain, snoring.  CV: Denies chest pain, palpitations, irregular heartbeat, syncope, dyspnea, diaphoresis, orthopnea, PND, claudication or edema. Respiratory: denies cough, dyspnea, DOE, pleurisy, hoarseness, laryngitis, wheezing.  Gastrointestinal: Denies dysphagia, odynophagia, heartburn, reflux, water brash, abdominal pain or cramps, nausea, vomiting, bloating, diarrhea, constipation, hematemesis, melena, hematochezia  or hemorrhoids. Genitourinary: Denies dysuria, frequency, urgency, nocturia, hesitancy, discharge, hematuria or flank pain. Musculoskeletal: Denies arthralgias, myalgias, stiffness, jt. swelling, pain, limping or strain/sprain. Denies falls. Skin: Denies pruritus, rash, hives, warts, acne, eczema or change in skin lesion(s). Neuro: No weakness, tremor, incoordination, spasms, paresthesia or pain. Psychiatric: Denies confusion, memory loss or sensory loss. Denies Depression. Endo: Denies change in weight, skin or hair change.  Heme/Lymph:  No excessive bleeding, bruising or enlarged lymph nodes.  Physical Exam  BP 124/66 mmHg  Pulse 86  Temp(Src) 97.5 F (36.4 C)  Resp 16  Ht 5\' 6"  (1.676 m)  Wt 157 lb 9.6 oz (71.487 kg)  BMI 25.45 kg/m2  SpO2 95%  Appears well nourished and in no distress. Eyes: PERRLA, EOMs, conjunctiva no swelling or erythema. Sinuses: No frontal/maxillary tenderness ENT/Mouth: EAC's clear, TM's nl w/o erythema, bulging. Nares clear w/o erythema, swelling, exudates. Oropharynx clear without erythema or exudates. Oral hygiene is good. Tongue normal, non obstructing. Hearing intact.  Neck: Supple. Thyroid nl. Car 2+/2+ without bruits, nodes or JVD. Chest: Decreased BS with inspiratory wheezes and few scattered rales/rhonchi Cor: Heart sounds normal w/ regular rate and rhythm without sig. murmurs, gallops, clicks, or rubs. Peripheral pulses normal and equal  without edema.  Abdomen: Soft & bowel sounds normal. Non-tender w/o guarding, rebound, hernias, masses, or organomegaly.  Lymphatics: Unremarkable.  Musculoskeletal:  Full ROM all peripheral extremities, joint stability, 5/5 strength, and normal gait.  Skin: Warm, dry without exposed rashes, lesions or ecchymosis apparent.  Neuro: Cranial nerves intact, reflexes equal bilaterally. Sensory-motor testing grossly intact. Tendon reflexes grossly intact.  Pysch: Alert & oriented x 3.  Insight and judgement nl & appropriate. No ideations.  Assessment and Plan:  1. Essential hypertension   2. Hyperlipemia  - Lipid panel  3. Prediabetes  - Hemoglobin A1c - Insulin, random  4. Vitamin D deficiency  - VITAMIN D 25 Hydroxy (  5. Other abnormal glucose  - Hemoglobin A1c - Insulin, random  6. Hypothyroidism  - TSH  7. Chronic obstructive pulmonary disease (HCC)  - Fluticasone Furoate-Vilanterol (BREO ELLIPTA) 100-25 MCG/INH AEPB; 1 inhalation daily Sx's # 28 dose til sees Dr Ashok Cordia  8. Medication management  - CBC with  Differential/Platelet - BASIC METABOLIC PANEL WITH GFR - Hepatic function panel - Magnesium - Digoxin level   Recommended regular exercise, BP monitoring, weight control, and discussed med and SE's. Recommended labs to assess and monitor clinical status. Further disposition pending results of labs. Over 30 minutes of exam, counseling, chart review was performed

## 2015-04-29 LAB — LIPID PANEL
Cholesterol: 195 mg/dL (ref 125–200)
HDL: 43 mg/dL — ABNORMAL LOW (ref >=46)
LDL Cholesterol: 114 mg/dL (ref <130)
Total CHOL/HDL Ratio: 4.5 Ratio (ref <=5.0)
Triglycerides: 188 mg/dL — ABNORMAL HIGH (ref <150)
VLDL: 38 mg/dL — ABNORMAL HIGH (ref <30)

## 2015-04-29 LAB — BASIC METABOLIC PANEL WITH GFR
BUN: 19 mg/dL (ref 7–25)
CO2: 30 mmol/L (ref 20–31)
Calcium: 9.3 mg/dL (ref 8.6–10.4)
Chloride: 96 mmol/L — ABNORMAL LOW (ref 98–110)
Creat: 0.89 mg/dL (ref 0.60–0.93)
GFR, Est African American: 75 mL/min (ref >=60)
GFR, Est Non African American: 65 mL/min (ref >=60)
Glucose, Bld: 97 mg/dL (ref 65–99)
Potassium: 4.5 mmol/L (ref 3.5–5.3)
Sodium: 138 mmol/L (ref 135–146)

## 2015-04-29 LAB — HEPATIC FUNCTION PANEL
ALT: 15 U/L (ref 6–29)
AST: 17 U/L (ref 10–35)
Albumin: 4.2 g/dL (ref 3.6–5.1)
Alkaline Phosphatase: 68 U/L (ref 33–130)
Bilirubin, Direct: 0.1 mg/dL (ref <=0.2)
Indirect Bilirubin: 0.4 mg/dL (ref 0.2–1.2)
Total Bilirubin: 0.5 mg/dL (ref 0.2–1.2)
Total Protein: 7 g/dL (ref 6.1–8.1)

## 2015-04-29 LAB — MAGNESIUM: Magnesium: 1.8 mg/dL (ref 1.5–2.5)

## 2015-04-29 LAB — HEMOGLOBIN A1C
Hgb A1c MFr Bld: 6.4 % — ABNORMAL HIGH (ref <5.7)
Mean Plasma Glucose: 137 mg/dL — ABNORMAL HIGH (ref <117)

## 2015-04-29 LAB — VITAMIN D 25 HYDROXY (VIT D DEFICIENCY, FRACTURES): Vit D, 25-Hydroxy: 40 ng/mL (ref 30–100)

## 2015-04-29 LAB — INSULIN, RANDOM: Insulin: 9.5 u[IU]/mL (ref 2.0–19.6)

## 2015-04-29 LAB — DIGOXIN LEVEL: DIGOXIN LVL: 1 ug/L (ref 0.8–2.0)

## 2015-04-29 LAB — TSH: TSH: 1.04 mIU/L

## 2015-04-30 ENCOUNTER — Telehealth: Payer: Self-pay | Admitting: Pulmonary Disease

## 2015-04-30 MED ORDER — UMECLIDINIUM BROMIDE 62.5 MCG/INH IN AEPB: 1.0000 | INHALATION_SPRAY | Freq: Every day | RESPIRATORY_TRACT | Status: DC

## 2015-04-30 NOTE — Telephone Encounter (Signed)
Spoke with pt. She is referring to Incruse. This has been sent in. Nothing further was needed.

## 2015-05-04 ENCOUNTER — Telehealth: Payer: Self-pay | Admitting: Pulmonary Disease

## 2015-05-04 NOTE — Telephone Encounter (Signed)
Spoke with pt. States that Incruse is causing her to cough. Her PCP gave her Breo 200 last week and this is working well for her. She is wanting to switch from Incruse to [place].  JN - please advise. Thanks.

## 2015-05-04 NOTE — Telephone Encounter (Signed)
I'm fine with that. We can just continue the dose that he started. I believe it was the Breo 100 though. JN

## 2015-05-05 MED ORDER — FLUTICASONE FUROATE-VILANTEROL 100-25 MCG/INH IN AEPB: 1.0000 | INHALATION_SPRAY | Freq: Every day | RESPIRATORY_TRACT | Status: DC

## 2015-05-05 NOTE — Telephone Encounter (Signed)
Rx has been sent in per JN. Pt is aware. Nothing further was needed at this time.

## 2015-05-06 DIAGNOSIS — G894 Chronic pain syndrome: Secondary | ICD-10-CM | POA: Diagnosis not present

## 2015-05-06 DIAGNOSIS — M5481 Occipital neuralgia: Secondary | ICD-10-CM | POA: Diagnosis not present

## 2015-05-06 DIAGNOSIS — M5412 Radiculopathy, cervical region: Secondary | ICD-10-CM | POA: Diagnosis not present

## 2015-05-06 DIAGNOSIS — M961 Postlaminectomy syndrome, not elsewhere classified: Secondary | ICD-10-CM | POA: Diagnosis not present

## 2015-06-02 ENCOUNTER — Encounter: Payer: Self-pay | Admitting: Internal Medicine

## 2015-06-02 DIAGNOSIS — M961 Postlaminectomy syndrome, not elsewhere classified: Secondary | ICD-10-CM | POA: Diagnosis not present

## 2015-06-02 DIAGNOSIS — M5481 Occipital neuralgia: Secondary | ICD-10-CM | POA: Diagnosis not present

## 2015-06-02 DIAGNOSIS — G894 Chronic pain syndrome: Secondary | ICD-10-CM | POA: Diagnosis not present

## 2015-06-02 DIAGNOSIS — M5412 Radiculopathy, cervical region: Secondary | ICD-10-CM | POA: Diagnosis not present

## 2015-06-24 ENCOUNTER — Ambulatory Visit (INDEPENDENT_AMBULATORY_CARE_PROVIDER_SITE_OTHER): Payer: Medicare Other | Admitting: Physician Assistant

## 2015-06-24 ENCOUNTER — Ambulatory Visit (HOSPITAL_COMMUNITY)
Admission: RE | Admit: 2015-06-24 | Discharge: 2015-06-24 | Disposition: A | Discharge status: Home / Self Care | Payer: Medicare Other | Source: Ambulatory Visit | Attending: Physician Assistant | Admitting: Physician Assistant

## 2015-06-24 ENCOUNTER — Encounter: Payer: Self-pay | Admitting: Internal Medicine

## 2015-06-24 VITALS — BP 138/82 | HR 60 | Temp 97.3°F | Resp 16 | Ht 66.0 in | Wt 162.8 lb

## 2015-06-24 DIAGNOSIS — S0083XA Contusion of other part of head, initial encounter: Secondary | ICD-10-CM

## 2015-06-24 DIAGNOSIS — W19XXXA Unspecified fall, initial encounter: Secondary | ICD-10-CM

## 2015-06-24 DIAGNOSIS — M79671 Pain in right foot: Secondary | ICD-10-CM

## 2015-06-24 DIAGNOSIS — R0781 Pleurodynia: Secondary | ICD-10-CM | POA: Insufficient documentation

## 2015-06-24 DIAGNOSIS — R51 Headache: Secondary | ICD-10-CM | POA: Diagnosis not present

## 2015-06-24 DIAGNOSIS — S9031XA Contusion of right foot, initial encounter: Secondary | ICD-10-CM | POA: Diagnosis not present

## 2015-06-24 DIAGNOSIS — R519 Headache, unspecified: Secondary | ICD-10-CM

## 2015-06-24 DIAGNOSIS — J449 Chronic obstructive pulmonary disease, unspecified: Secondary | ICD-10-CM | POA: Insufficient documentation

## 2015-06-24 DIAGNOSIS — M542 Cervicalgia: Secondary | ICD-10-CM | POA: Diagnosis not present

## 2015-06-24 DIAGNOSIS — R0789 Other chest pain: Secondary | ICD-10-CM

## 2015-06-24 DIAGNOSIS — S0181XA Laceration without foreign body of other part of head, initial encounter: Secondary | ICD-10-CM | POA: Diagnosis not present

## 2015-06-24 DIAGNOSIS — Z87891 Personal history of nicotine dependence: Secondary | ICD-10-CM | POA: Insufficient documentation

## 2015-06-24 DIAGNOSIS — R05 Cough: Secondary | ICD-10-CM | POA: Diagnosis not present

## 2015-06-24 DIAGNOSIS — W010XXA Fall on same level from slipping, tripping and stumbling without subsequent striking against object, initial encounter: Secondary | ICD-10-CM | POA: Diagnosis not present

## 2015-06-24 NOTE — Patient Instructions (Signed)
if worsening HA, changes vision/speech, imbalance, weakness go to the ER patient to go to ER if there is weakness, thunderclap headache, visual changes, or any concerning factors   Fall Prevention in the Home  Falls can cause injuries and can affect people from all age groups. There are many simple things that you can do to make your home safe and to help prevent falls. WHAT CAN I DO ON THE OUTSIDE OF MY HOME?  Regularly repair the edges of walkways and driveways and fix any cracks.  Remove high doorway thresholds.  Trim any shrubbery on the main path into your home.  Use bright outdoor lighting.  Clear walkways of debris and clutter, including tools and rocks.  Regularly check that handrails are securely fastened and in good repair. Both sides of any steps should have handrails.  Install guardrails along the edges of any raised decks or porches.  Have leaves, snow, and ice cleared regularly.  Use sand or salt on walkways during winter months.  In the garage, clean up any spills right away, including grease or oil spills. WHAT CAN I DO IN THE BATHROOM?  Use night lights.  Install grab bars by the toilet and in the tub and shower. Do not use towel bars as grab bars.  Use non-skid mats or decals on the floor of the tub or shower.  If you need to sit down while you are in the shower, use a plastic, non-slip stool.Marland Kitchen  Keep the floor dry. Immediately clean up any water that spills on the floor.  Remove soap buildup in the tub or shower on a regular basis.  Attach bath mats securely with double-sided non-slip rug tape.  Remove throw rugs and other tripping hazards from the floor. WHAT CAN I DO IN THE BEDROOM?  Use night lights.  Make sure that a bedside light is easy to reach.  Do not use oversized bedding that drapes onto the floor.  Have a firm chair that has side arms to use for getting dressed.  Remove throw rugs and other tripping hazards from the floor. WHAT CAN  I DO IN THE KITCHEN?   Clean up any spills right away.  Avoid walking on wet floors.  Place frequently used items in easy-to-reach places.  If you need to reach for something above you, use a sturdy step stool that has a grab bar.  Keep electrical cables out of the way.  Do not use floor polish or wax that makes floors slippery. If you have to use wax, make sure that it is non-skid floor wax.  Remove throw rugs and other tripping hazards from the floor. WHAT CAN I DO IN THE STAIRWAYS?  Do not leave any items on the stairs.  Make sure that there are handrails on both sides of the stairs. Fix handrails that are broken or loose. Make sure that handrails are as long as the stairways.  Check any carpeting to make sure that it is firmly attached to the stairs. Fix any carpet that is loose or worn.  Avoid having throw rugs at the top or bottom of stairways, or secure the rugs with carpet tape to prevent them from moving.  Make sure that you have a light switch at the top of the stairs and the bottom of the stairs. If you do not have them, have them installed. WHAT ARE SOME OTHER FALL PREVENTION TIPS?  Wear closed-toe shoes that fit well and support your feet. Wear shoes that have rubber soles  or low heels.  When you use a stepladder, make sure that it is completely opened and that the sides are firmly locked. Have someone hold the ladder while you are using it. Do not climb a closed stepladder.  Add color or contrast paint or tape to grab bars and handrails in your home. Place contrasting color strips on the first and last steps.  Use mobility aids as needed, such as canes, walkers, scooters, and crutches.  Turn on lights if it is dark. Replace any light bulbs that burn out.  Set up furniture so that there are clear paths. Keep the furniture in the same spot.  Fix any uneven floor surfaces.  Choose a carpet design that does not hide the edge of steps of a stairway.  Be aware of  any and all pets.  Review your medicines with your healthcare provider. Some medicines can cause dizziness or changes in blood pressure, which increase your risk of falling. Talk with your health care provider about other ways that you can decrease your risk of falls. This may include working with a physical therapist or trainer to improve your strength, balance, and endurance.   This information is not intended to replace advice given to you by your health care provider. Make sure you discuss any questions you have with your health care provider.   Document Released: 02/25/2002 Document Revised: 07/22/2014 Document Reviewed: 04/11/2014 Elsevier Interactive Patient Education [employer].

## 2015-06-24 NOTE — Progress Notes (Signed)
Subjective:    Patient ID: Destiny Hughes, female    DOB: [birth year], 70 y.o.   MRN: [id number]  HPI 70 y.o. smoking white female with history of COPD, tachyardia, chronic back/neck pain on methadone and oxycodone from Dr. Hardin Negus presents s/p fall on 06/19/2015. She states she was turning around in her bedroom, hooked her right foot on the floor and fell, she denies any symptoms before falling such as dizziness, CP, SOB, etc. She fell onto her forehead, chest, no out stretched hands, has a laceration that her and her husband steri stripped. She states that she did not LOC. She states that her right side, right foot, right hand.   Blood pressure 138/82, pulse 60, temperature 97.3 F (36.3 C), resp. rate 16, height 5\' 6"  (1.676 m), weight 162 lb 12.8 oz (73.846 kg).  Past Medical History  Diagnosis Date  . Palpitation   . Tachycardia   . Dyspnea   . Hyperlipemia   . Glaucoma   . Chronic neck pain   . Chronic back pain   . Collapse of right lung   . Hypothyroidism     tx. Levothyroxine  . Blood transfusion     after Thoracic surgery ?'04  . GERD (gastroesophageal reflux disease)     tx. TUMS  . Depression   . COPD (chronic obstructive pulmonary disease) (HCC)     Hx. Bronchitis occ, 01-25-11 somes issue now taking  Z-pak-started 01-24-11/Scar tissue  present  from previous lung collapse  . Heart murmur   . Headache(784.0) 01-25-11    neck and nerve pain related.  . Arthritis 01-25-11    back and most joints  . Neuromuscular disorder ([place]) 01-25-11    Pain/nerve stimulator implanted -2 yrs ago.  Marland Kitchen Dysrhythmia     hx.PAT- tx. Inderal  . Nephrolithiasis   . Emphysema of lung [facility])     Current Outpatient Prescriptions on File Prior to Visit  Medication Sig Dispense Refill  . Biotin (BIOTIN 5000) 5 MG CAPS Take 1 capsule by mouth daily.      . Cholecalciferol (VITAMIN D PO) Take 5,000 Units by mouth daily.    . diazepam (VALIUM) 5 MG tablet Take 5 mg by mouth every 6 (six)  hours as needed (for muscle spams).     Marland Kitchen DIGOX 250 MCG tablet TAKE 1 TABLET BY MOUTH EVERY MORNING 90 tablet 1  . fluticasone furoate-vilanterol (BREO ELLIPTA) 100-25 MCG/INH AEPB Inhale 1 puff into the lungs daily. 60 each 5  . levothyroxine (SYNTHROID, LEVOTHROID) 150 MCG tablet TAKE 1 TABLET BY MOUTH EVERY DAY 30 TO 60 MINUTES BEFIORE EATING AND TAKE 1.5 TABLETS BY MOUTH ON TUESDAY AND THURSDAY 20 tablet 0  . methadone (DOLOPHINE) 5 MG tablet Take 5 mg by mouth every 6 (six) hours.     Marland Kitchen OVER THE COUNTER MEDICATION OTC Flonase nasal spray PRN    . Oxycodone HCl 20 MG TABS TK 1 T PO EVERY 6 HOURS AS NEEDED.  0  . sertraline (ZOLOFT) 100 MG tablet Take 150 mg by mouth every morning.     . [DISCONTINUED] ferrous sulfate 325 (65 FE) MG tablet Take 1 tablet (325 mg total) by mouth 3 (three) times daily after meals.    . [DISCONTINUED] rivaroxaban (XARELTO) 10 MG TABS tablet Take 1 tablet (10 mg total) by mouth daily. 12 tablet    No current facility-administered medications on file prior to visit.   Review of Systems  Constitutional: Negative.   Respiratory: Positive  for cough and shortness of breath (unchanged). Negative for apnea, choking, chest tightness, wheezing and stridor.   Cardiovascular: Negative.  Negative for chest pain.  Gastrointestinal: Negative.   Endocrine: Negative.   Musculoskeletal: Positive for myalgias, back pain, arthralgias, gait problem, neck pain and neck stiffness. Negative for joint swelling.  Skin: Negative.  Negative for rash.  Neurological: Positive for headaches. Negative for dizziness, tremors, seizures, syncope, facial asymmetry, speech difficulty, weakness, light-headedness and numbness.       Objective:   Physical Exam  Constitutional: She is oriented to person, place, and time. She appears well-developed and well-nourished.  HENT:  Head: Not macrocephalic and not microcephalic. Head is with raccoon's eyes (healing), with contusion and with laceration  (well healing 1 cm laceration on forhead with surrounding bruising). Head is without Battle's sign, without abrasion, without right periorbital erythema and without left periorbital erythema. Hair is normal.  Mouth/Throat: Uvula is midline and oropharynx is clear and moist.  Some maxillary sinus tenderness, no deformity  Neck: Neck supple. Muscular tenderness present. No spinous process tenderness present. No rigidity. Decreased range of motion present. No thyroid mass present.  Cardiovascular: Normal rate, regular rhythm and normal heart sounds.   Pulmonary/Chest: Effort normal. No respiratory distress. She has decreased breath sounds (diffuse). She exhibits tenderness (right ribs). She exhibits no laceration, no crepitus and no deformity.  Abdominal: Normal appearance and bowel sounds are normal. There is no tenderness.  Musculoskeletal:  + right foot tenderness at 5th digit/MTP  Neurological: She is alert and oriented to person, place, and time. She has normal strength. No cranial nerve deficit.  Skin: Skin is warm and dry.  Psychiatric: She has a normal mood and affect.       Assessment & Plan:  Fall with possible LOC, worsening neck pain, has had several neck surgeries.  States tripped but patient  is on high dose of pain medications - suggest decrease in pain medications, she does not drive + maxillary facial bruising,/raccoon eyes and right rib tenderness Will get facial xray, xray right rib, x ray right foot, may need Ct face pending Xray With history of neck surgeries and spinal stimulator will get CT head and neck, some dizziness, Ha, no acute nuero at this time.  Patient understands if worsening HA, changes vision/speech, imbalance, weakness go to the ER

## 2015-06-29 ENCOUNTER — Ambulatory Visit
Admission: RE | Admit: 2015-06-29 | Discharge: 2015-06-29 | Disposition: A | Discharge status: Home / Self Care | Payer: Medicare Other | Source: Ambulatory Visit | Attending: Physician Assistant | Admitting: Physician Assistant

## 2015-06-29 DIAGNOSIS — W19XXXA Unspecified fall, initial encounter: Secondary | ICD-10-CM

## 2015-06-29 DIAGNOSIS — M542 Cervicalgia: Secondary | ICD-10-CM | POA: Diagnosis not present

## 2015-06-29 DIAGNOSIS — R51 Headache: Secondary | ICD-10-CM | POA: Diagnosis not present

## 2015-06-29 DIAGNOSIS — R519 Headache, unspecified: Secondary | ICD-10-CM

## 2015-07-01 DIAGNOSIS — M5481 Occipital neuralgia: Secondary | ICD-10-CM | POA: Diagnosis not present

## 2015-07-01 DIAGNOSIS — M961 Postlaminectomy syndrome, not elsewhere classified: Secondary | ICD-10-CM | POA: Diagnosis not present

## 2015-07-01 DIAGNOSIS — G894 Chronic pain syndrome: Secondary | ICD-10-CM | POA: Diagnosis not present

## 2015-07-01 DIAGNOSIS — M5412 Radiculopathy, cervical region: Secondary | ICD-10-CM | POA: Diagnosis not present

## 2015-07-08 ENCOUNTER — Other Ambulatory Visit: Payer: Self-pay | Admitting: Internal Medicine

## 2015-07-08 ENCOUNTER — Telehealth: Payer: Self-pay | Admitting: *Deleted

## 2015-07-08 NOTE — Telephone Encounter (Signed)
Patient called and reported her Propranolol is burning her throat when she swallows the tablet and asked to be changed to a capsule. Per Dr Melford Aase, the patient should change the med to Atenolol, which she. already has, and break it into quarters and take with food to protect her throat. Patient became very upset and refused to follow his suggestion.

## 2015-07-31 DIAGNOSIS — M5481 Occipital neuralgia: Secondary | ICD-10-CM | POA: Diagnosis not present

## 2015-07-31 DIAGNOSIS — M5412 Radiculopathy, cervical region: Secondary | ICD-10-CM | POA: Diagnosis not present

## 2015-07-31 DIAGNOSIS — G894 Chronic pain syndrome: Secondary | ICD-10-CM | POA: Diagnosis not present

## 2015-07-31 DIAGNOSIS — M961 Postlaminectomy syndrome, not elsewhere classified: Secondary | ICD-10-CM | POA: Diagnosis not present

## 2015-08-05 ENCOUNTER — Other Ambulatory Visit: Payer: Self-pay | Admitting: Internal Medicine

## 2015-08-11 NOTE — Progress Notes (Signed)
Cardiology Office Note   Date:  08/13/2015   ID:  Destiny Hughes, DOB 21-Jan-1946, MRN [id number]  PCP:  Alesia Richards, MD  Cardiologist:   Skeet Latch, MD   Chief Complaint  Patient presents with  . New Evaluation    Re-establish care--former Dr. Mare Ferrari pt, last seen in 2013--Palpitaions  pt c/o SOB (COPD); dizziness when changing positions; swelling in legs/feet/ankles; has been feeling her heartbeat in her ear      History of Present Illness: Destiny Hughes is a 70 y.o. female with hypertension, mitral valve prolapse, COPD, hyperlipidemia, and hypothyroidism who presents to re-establish care.   She had a normal adenosine Cardiolite in 2007 and in 2011.  She also had an echo 04/1998 that didn't show any MVP.  Her echo 08/10/09 showed mild aortic sclerosis, normal systolic function and abnormal diastolic function.  [name] was previously a patient of Dr. Mare Ferrari.  He presents today to reestablish care.  She recently got a new PCP who changed several medications that she had been stable on.  He recommended that she switch propranolol to atenolol due to hypertension.  She has not yet started atenolol because she she still has propranolol tablets left.  Destiny Hughes reports having palpitations since childhood.  This has been stable with propranolol and digoxin.  She also notes shortness of breath, lower extremity edema and occasional orthopnea. She recently was prescribed a new inhaler which she is unable to use that she doesn't like the way it feels in her mouth.  She notes occasional episodes of chest discomfort that typically happen when she gets upset. She denies any any exertional chest pain but does not exert herself much.  Destiny Hughes continues to smoke one half pack of cigarettes daily.  She is interested in trying to quit. Currently she is trying cold [place] and it is not working.   Past Medical History  Diagnosis Date  . Palpitation   .  Tachycardia   . Dyspnea   . Hyperlipemia   . Glaucoma   . Chronic neck pain   . Chronic back pain   . Collapse of right lung   . Hypothyroidism     tx. Levothyroxine  . Blood transfusion     after Thoracic surgery ?'04  . GERD (gastroesophageal reflux disease)     tx. TUMS  . Depression   . COPD (chronic obstructive pulmonary disease) (HCC)     Hx. Bronchitis occ, 01-25-11 somes issue now taking  Z-pak-started 01-24-11/Scar tissue  present  from previous lung collapse  . Heart murmur   . Headache(784.0) 01-25-11    neck and nerve pain related.  . Arthritis 01-25-11    back and most joints  . Neuromuscular disorder ([place]) 01-25-11    Pain/nerve stimulator implanted -2 yrs ago.  Marland Kitchen Dysrhythmia     hx.PAT- tx. Inderal  . Nephrolithiasis   . Emphysema of lung [facility])     Past Surgical History  Procedure Laterality Date  . Spinal cord stimulator implant  2009 APPROX    DR. ELSNER  . Cervical spine surgery  01-25-11    2005-multiple levels  . Thoracic spine surgery  01-25-11    6'02- then nerve stimulator implanted after  . Tubal ligation    . Appendectomy  01-25-11  . Joint replacement  01-25-11    6'03 LTHA-hemi  . Knee arthroplasty  01-25-11    '04-right, revised x2  . Cholecystectomy  01-25-11    '07  .  Carpal tunnel release  01-25-11    Bil.  . Total knee revision  02/01/2011    Procedure: TOTAL KNEE REVISION;  Surgeon: Mauri Pole;  [facility]: [facility];  Service: Orthopedics;  Laterality: Right;  . Colonoscopy with propofol N/A 07/23/2012    Procedure: COLONOSCOPY WITH PROPOFOL;  Surgeon: Garlan Fair, MD;  [facility];  Service: Endoscopy;  Laterality: N/A;     Current Outpatient Prescriptions  Medication Sig Dispense Refill  . Biotin (BIOTIN 5000) 5 MG CAPS Take 1 capsule by mouth daily.      . Cholecalciferol (VITAMIN D PO) Take 5,000 Units by mouth daily.    . diazepam (VALIUM) 5 MG tablet Take 5 mg by mouth every 6 (six) hours as needed (for muscle spams).      Marland Kitchen DIGOX 250 MCG tablet TAKE 1 TABLET BY MOUTH EVERY MORNING 90 tablet 1  . levothyroxine (SYNTHROID, LEVOTHROID) 150 MCG tablet TAKE 1 TABLET BY MOUTH EVERY DAY 30 TO 60 MINUTES BEFIORE EATING AND TAKE 1.5 TABLETS BY MOUTH ON TUESDAY AND THURSDAY 20 tablet 0  . methadone (DOLOPHINE) 5 MG tablet Take 5 mg by mouth every 6 (six) hours.     Marland Kitchen OVER THE COUNTER MEDICATION OTC Flonase nasal spray PRN    . oxyCODONE (ROXICODONE) 15 MG immediate release tablet Take 1 tablet by mouth every 6 (six) hours as needed.  0  . propranolol (INDERAL) 80 MG tablet Take 1 tablet by mouth 2 (two) times daily. For blood pressure.  1  . sertraline (ZOLOFT) 100 MG tablet Take 150 mg by mouth every morning.     Marland Kitchen lisinopril (PRINIVIL,ZESTRIL) 10 MG tablet Take 1 tablet (10 mg total) by mouth daily. 30 tablet 5  . [DISCONTINUED] ferrous sulfate 325 (65 FE) MG tablet Take 1 tablet (325 mg total) by mouth 3 (three) times daily after meals.    . [DISCONTINUED] rivaroxaban (XARELTO) 10 MG TABS tablet Take 1 tablet (10 mg total) by mouth daily. 12 tablet    No current facility-administered medications for this visit.    Allergies:   Ventolin; Albuterol; Codeine; Penicillins; and Ecotrin    Social History:  The patient  reports that she has been smoking Cigarettes.  She started smoking about 53 years ago. She has a 25 pack-year smoking history. She has never used smokeless tobacco. She reports that she does not drink alcohol or use illicit drugs.   Family History:  The patient's family history includes Aneurysm in her mother; Asthma in her son; Breast cancer in her paternal aunt; Diabetes in her son; Emphysema in her father and mother; Heart attack in her father; Hypertension in her son and son; Rheum arthritis in her maternal aunt.    ROS:  Please see the history of present illness.   Otherwise, review of systems are positive for dysphagia, knee pain, back pain.   All other systems are reviewed and negative.     PHYSICAL EXAM: VS:  BP 142/92 mmHg  Pulse 53  Ht 5\' 6"  (1.676 m)  Wt 70.761 kg (156 lb)  BMI 25.19 kg/m2 , BMI Body mass index is 25.19 kg/(m^2). GENERAL:  Well appearing HEENT:  Pupils equal round and reactive, fundi not visualized, oral mucosa unremarkable NECK:  No jugular venous distention, waveform within normal limits, carotid upstroke brisk and symmetric, no bruits, no thyromegaly LYMPHATICS:  No cervical adenopathy LUNGS:  Clear to auscultation bilaterally HEART:  RRR.  PMI not displaced or sustained,S1 and S2 within normal limits,  no S3, no S4, no clicks, no rubs, no murmurs ABD:  Flat, positive bowel sounds normal in frequency in pitch, no bruits, no rebound, no guarding, no midline pulsatile mass, no hepatomegaly, no splenomegaly EXT:  2 plus pulses throughout, no edema, no cyanosis no clubbing SKIN:  No rashes no nodules NEURO:  Cranial nerves II through XII grossly intact, motor grossly intact throughout PSYCH:  Cognitively intact, oriented to person place and time   EKG:  EKG is ordered today. The ekg ordered today demonstrates sinus bradycardia. Rate 53 bpm. Left anterior fascicular block. Nonspecific ST changes.   Recent Labs: 04/28/2015: ALT 15; BUN 19; Creat 0.89; Hemoglobin 16.2*; Magnesium 1.8; Platelets 280; Potassium 4.5; Sodium 138; TSH 1.04    Lipid Panel    Component Value Date/Time   CHOL 195 04/28/2015 1525   TRIG 188* 04/28/2015 1525   HDL 43* 04/28/2015 1525   CHOLHDL 4.5 04/28/2015 1525   VLDL 38* 04/28/2015 1525   LDLCALC 114 04/28/2015 1525      Wt Readings from Last 3 Encounters:  08/13/15 70.761 kg (156 lb)  06/24/15 73.846 kg (162 lb 12.8 oz)  04/28/15 71.487 kg (157 lb 9.6 oz)      ASSESSMENT AND PLAN:  # Palpitations: Symptoms are well-controlled on propranolol and digoxin. We will not make any changes at this time.   # Hypertension: Blood pressure is above goal. Her PCP recently tried to get her to switch from propranolol to  atenolol, but she is not excited about making this change. Instead, we will continue propranolol and start lisinopril 10 mg daily. She will have a basic metabolic panel checked in one week.   # Tobacco abuse: We discussed smoking cessation for 4 minutes.  She plans to quit cold [place] and does not want any pharmacologic intervention.    # Chest pain:  # Shortness of breath: Symptoms are atypical, but she does have several risk factors including hypertension and ongoing tobacco abuse. Therefore, we will obtain a [employer], as she is unable to walk on a treadmill.  We will also obtain an echocardiogram to evaluate for wall motion abnormalities and heart failure.  # Low vitamin D: DestinyKubicek has a history of low vitamin D levels. She has an appointment with her PCP in one week and requests that we check her levels so that it can be available at that appointment.  Current medicines are reviewed at length with the patient today.  The patient does not have concerns regarding medicines.  The following changes have been made:  Add lisinopril 10 mg daily.  Labs/ tests ordered today include:   Orders Placed This Encounter  Procedures  . Basic metabolic panel  . Vitamin D (25 hydroxy)  . Myocardial Perfusion Imaging  . EKG 12-Lead  . ECHOCARDIOGRAM COMPLETE    Disposition:   FU with Eeva Schlosser C. Oval Linsey, MD, [facility] in 3 months.    This note was written with the assistance of speech recognition software.  Please excuse any transcriptional errors.  Signed, Kya Mayfield C. Oval Linsey, MD, [facility]  08/13/2015 6:26 PM    [place]

## 2015-08-13 ENCOUNTER — Ambulatory Visit (INDEPENDENT_AMBULATORY_CARE_PROVIDER_SITE_OTHER): Payer: Medicare Other | Admitting: Cardiovascular Disease

## 2015-08-13 ENCOUNTER — Encounter: Payer: Self-pay | Admitting: Cardiovascular Disease

## 2015-08-13 VITALS — BP 142/92 | HR 53 | Ht 66.0 in | Wt 156.0 lb

## 2015-08-13 DIAGNOSIS — Z79899 Other long term (current) drug therapy: Secondary | ICD-10-CM

## 2015-08-13 DIAGNOSIS — E559 Vitamin D deficiency, unspecified: Secondary | ICD-10-CM

## 2015-08-13 DIAGNOSIS — R0602 Shortness of breath: Secondary | ICD-10-CM

## 2015-08-13 DIAGNOSIS — F17210 Nicotine dependence, cigarettes, uncomplicated: Secondary | ICD-10-CM | POA: Diagnosis not present

## 2015-08-13 DIAGNOSIS — R079 Chest pain, unspecified: Secondary | ICD-10-CM | POA: Diagnosis not present

## 2015-08-13 MED ORDER — LISINOPRIL 10 MG PO TABS: 10.0000 mg | ORAL_TABLET | Freq: Every day | ORAL | Status: DC

## 2015-08-13 NOTE — Patient Instructions (Addendum)
Medication Instructions:  START LISINOPRIL 10 MG DAILY   Labwork: BMET/VIT D LEVEL IN 1 WEEK AT [facility] ON THE FIRST FLOOR  Testing/Procedures: Your physician has requested that you have an echocardiogram. Echocardiography is a painless test that uses sound waves to create images of your heart. It provides your doctor with information about the size and shape of your heart and how well your heart's chambers and valves are working. This procedure takes approximately one hour. There are no restrictions for this procedure.  Your physician has requested that you have a lexiscan myoview. For further information please visit [email or web address]. Please follow instruction sheet, as given.  THE ABOVE TESTING WILL BE DONE AT [place] [address]  Follow-Up: Your physician recommends that you schedule a follow-up appointment in: 3 MONTH OV  Any Other Special Instructions Will Be Listed Below (If Applicable). [facility] [phone] 11/05/15 AT 2:30 ARRIVE AT 2:20  If you need a refill on your cardiac medications before your next appointment, please call your pharmacy.

## 2015-08-18 ENCOUNTER — Encounter: Payer: Self-pay | Admitting: Internal Medicine

## 2015-08-27 ENCOUNTER — Telehealth (HOSPITAL_COMMUNITY): Payer: Self-pay | Admitting: *Deleted

## 2015-08-27 ENCOUNTER — Ambulatory Visit (INDEPENDENT_AMBULATORY_CARE_PROVIDER_SITE_OTHER): Payer: Medicare Other | Admitting: Pulmonary Disease

## 2015-08-27 ENCOUNTER — Encounter: Payer: Self-pay | Admitting: Pulmonary Disease

## 2015-08-27 VITALS — BP 140/86 | HR 50 | Ht 66.0 in | Wt 160.0 lb

## 2015-08-27 DIAGNOSIS — K219 Gastro-esophageal reflux disease without esophagitis: Secondary | ICD-10-CM

## 2015-08-27 DIAGNOSIS — J438 Other emphysema: Secondary | ICD-10-CM

## 2015-08-27 DIAGNOSIS — J439 Emphysema, unspecified: Secondary | ICD-10-CM | POA: Diagnosis not present

## 2015-08-27 DIAGNOSIS — F17200 Nicotine dependence, unspecified, uncomplicated: Secondary | ICD-10-CM | POA: Diagnosis not present

## 2015-08-27 DIAGNOSIS — J449 Chronic obstructive pulmonary disease, unspecified: Secondary | ICD-10-CM | POA: Diagnosis not present

## 2015-08-27 LAB — PULMONARY FUNCTION TEST
DL/VA % pred: 60 %
DL/VA: 3.04 ml/min/mmHg/L
DLCO UNC: 11.41 ml/min/mmHg
DLCO cor % pred: 42 %
DLCO cor: 11.52 ml/min/mmHg
DLCO unc % pred: 42 %
FEF 25-75 PRE: 0.49 L/s
FEF2575-%PRED-PRE: 25 %
FEV1-%PRED-PRE: 31 %
FEV1-PRE: 0.75 L
FEV1FVC-%Pred-Pre: 91 %
FEV6-%Pred-Pre: 35 %
FEV6-PRE: 1.08 L
FEV6FVC-%PRED-PRE: 105 %
FVC-%Pred-Pre: 34 %
FVC-Pre: 1.09 L
PRE FEV1/FVC RATIO: 69 %
Pre FEV6/FVC Ratio: 100 %

## 2015-08-27 MED ORDER — AEROCHAMBER MV MISC: Status: DC

## 2015-08-27 MED ORDER — TIOTROPIUM BROMIDE MONOHYDRATE 2.5 MCG/ACT IN AERS: 2.0000 | INHALATION_SPRAY | Freq: Every day | RESPIRATORY_TRACT | Status: DC

## 2015-08-27 NOTE — Progress Notes (Addendum)
Subjective:    Patient ID: Destiny Hughes, female    DOB: January 03, 1946, 70 y.o.   MRN: [id number]  C.C.:  Follow-up for Very Severe COPD w/ Emphysema, Chronic Bronchitis, GERD, & Ongoing Tobacco Use.  HPI Very Severe COPD w/ Emphysema: Previously was taking Theophylline. Unable to afford Spiriva or [place]. Counseled to use Breo at last appointment. Patient reports she has been wheezing more in the last week. She feel the powder from the [facility] caused her to "clog up" so she quit using it. She has intermittent coughing productive of a yellow or white mucus.   Chronic Bronchitis:  Likely secondary to tobacco use. No antibiotics since last visit.   GERD: Prescribed Zantac. Has not been taking. Still having intermittent reflux. Has been taking Prevacid intermittently.   Ongoing Tobacco Use:  Previously was smoking 1/2ppd. She still is smoking 1/2 ppd but sometimes smokes 1ppd.   Review of Systems  No new chest pain or pressure. Has follow-up with Cardiology and is planned for stress test. No fever or chills. Chronic sweats.   Allergies  Allergen Reactions  . Ventolin [Kdc:Albuterol] Anaphylaxis  . Albuterol     Had difficulty breathing after a nebulizer treatment  . Codeine Nausea And Vomiting  . Penicillins Swelling  . Ecotrin [Aspirin] Nausea And Vomiting   Current Outpatient Prescriptions on File Prior to Visit  Medication Sig Dispense Refill  . Biotin (BIOTIN 5000) 5 MG CAPS Take 1 capsule by mouth daily.      . Cholecalciferol (VITAMIN D PO) Take 5,000 Units by mouth daily.    . diazepam (VALIUM) 5 MG tablet Take 5 mg by mouth every 6 (six) hours as needed (for muscle spams).     Marland Kitchen DIGOX 250 MCG tablet TAKE 1 TABLET BY MOUTH EVERY MORNING 90 tablet 1  . levothyroxine (SYNTHROID, LEVOTHROID) 150 MCG tablet TAKE 1 TABLET BY MOUTH EVERY DAY 30 TO 60 MINUTES BEFIORE EATING AND TAKE 1.5 TABLETS BY MOUTH ON TUESDAY AND THURSDAY 20 tablet 0  . lisinopril (PRINIVIL,ZESTRIL) 10 MG tablet  Take 1 tablet (10 mg total) by mouth daily. 30 tablet 5  . methadone (DOLOPHINE) 5 MG tablet Take 5 mg by mouth every 6 (six) hours.     Marland Kitchen OVER THE COUNTER MEDICATION OTC Flonase nasal spray PRN    . oxyCODONE (ROXICODONE) 15 MG immediate release tablet Take 1 tablet by mouth every 6 (six) hours as needed.  0  . propranolol (INDERAL) 80 MG tablet Take 1 tablet by mouth 2 (two) times daily. For blood pressure.  1  . sertraline (ZOLOFT) 100 MG tablet Take 150 mg by mouth every morning.     . [DISCONTINUED] ferrous sulfate 325 (65 FE) MG tablet Take 1 tablet (325 mg total) by mouth 3 (three) times daily after meals.    . [DISCONTINUED] rivaroxaban (XARELTO) 10 MG TABS tablet Take 1 tablet (10 mg total) by mouth daily. 12 tablet    No current facility-administered medications on file prior to visit.   Past Medical History  Diagnosis Date  . Palpitation   . Tachycardia   . Dyspnea   . Hyperlipemia   . Glaucoma   . Chronic neck pain   . Chronic back pain   . Collapse of right lung   . Hypothyroidism     tx. Levothyroxine  . Blood transfusion     after Thoracic surgery ?'04  . GERD (gastroesophageal reflux disease)     tx. TUMS  . Depression   .  COPD (chronic obstructive pulmonary disease) (HCC)     Hx. Bronchitis occ, 01-25-11 somes issue now taking  Z-pak-started 01-24-11/Scar tissue  present  from previous lung collapse  . Heart murmur   . Headache(784.0) 01-25-11    neck and nerve pain related.  . Arthritis 01-25-11    back and most joints  . Neuromuscular disorder ([place]) 01-25-11    Pain/nerve stimulator implanted -2 yrs ago.  Marland Kitchen Dysrhythmia     hx.PAT- tx. Inderal  . Nephrolithiasis   . Emphysema of lung [facility])    Past Surgical History  Procedure Laterality Date  . Spinal cord stimulator implant  2009 APPROX    DR. ELSNER  . Cervical spine surgery  01-25-11    2005-multiple levels  . Thoracic spine surgery  01-25-11    6'02- then nerve stimulator implanted after  . Tubal  ligation    . Appendectomy  01-25-11  . Joint replacement  01-25-11    6'03 LTHA-hemi  . Knee arthroplasty  01-25-11    '04-right, revised x2  . Cholecystectomy  01-25-11    '07  . Carpal tunnel release  01-25-11    Bil.  . Total knee revision  02/01/2011    Procedure: TOTAL KNEE REVISION;  Surgeon: Mauri Pole;  [facility];  Service: Orthopedics;  Laterality: Right;  . Colonoscopy with propofol N/A 07/23/2012    Procedure: COLONOSCOPY WITH PROPOFOL;  Surgeon: Garlan Fair, MD;  [facility];  Service: Endoscopy;  Laterality: N/A;   Family History  Problem Relation Age of Onset  . Heart attack Father   . Emphysema Father   . Aneurysm Mother     CEREBRAL  . Emphysema Mother   . Diabetes Son   . Hypertension Son   . Hypertension Son   . Asthma Son     x2  . Breast cancer Paternal Aunt   . Rheum arthritis Maternal Aunt    Social History   Social History  . Marital Status: Married    Spouse Name: N/A  . Number of Children: 2  . Years of Education: N/A   Occupational History  . disabled    Social History Main Topics  . Smoking status: Current Every Day Smoker -- 0.75 packs/day for 50 years    Types: Cigarettes    Start date: 09/13/1961  . Smokeless tobacco: Never Used     Comment: Peak rate of 1ppd  . Alcohol Use: No     Comment: none in 10 years  . Drug Use: No  . Sexual Activity: No   Other Topics Concern  . None   Social History Narrative   She is from [place] [facility]. She has always lived in [place]. She has traveled to [place], [place], [place], [place], & [place]. 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 Prior exposure to parakeets with last exposure remote in her current home. Previously had mold in her home that was in her bathroom & fixed. Prior exposure to hot tubs but none recently.       Objective:   Physical Exam BP 140/86 mmHg  Pulse 50  Ht 5\' 6"   (1.676 m)  Wt 160 lb (72.576 kg)  BMI 25.84 kg/m2  SpO2 90% General:  Awake. Alert. No distress. Husband with her today.  Integument:  Warm & dry. No rash on exposed skin.  HEENT:  Moist mucus membranes.  Moderate bilateral nasal turbinate swelling. No oral ulcers.  Cardiovascular:  Regular rate & rhythm. No edema.  Pulmonary:  Decreased aeration bilaterally. Normal work of breathing on room air. Musculoskeletal: Normal bulk and tone. No joint deformity appreciated.  PFT 08/27/15: FVC 1.09 L (34%) FEV1 0.75 L (31%) FEV1/FVC 0.69 FEF 25-75 0.49 L (25%)                                                                  DLCO uncorrected 42% 12/26/14: FVC 1.23 L (38%) FEV1 0.78 L (32%) FEV1/FVC 0.64 FEF 25-75 0.43 L (21%) TLC 4.96 L (92%) RV 128% ERV 27% DLCO uncorrected 51%  IMAGING HRCT CHEST W/O 11/18/14 (previously reviewed by me): No bronchiectasis or intralobular septal thickening. Diffuse bronchial wall thickening as well as mild centrilobular and paraseptal emphysema upper lobe predominant. Mild tracheobronchomalacia with air trapping. Atherosclerosis noted by radiologist. No mediastinal mass or pathologic adenopathy on my review. No pericardial effusion. No pleural effusion or thickening.  Barium Swallow (11/17/14): Mild esophageal dysmotility with mild GERD. No stricture, mass, or esophageal mucosal irregularity.  CXR PA/LAT 10/21/14 (previously reviewed by me): Upper thoracic spinal cord stimulator noted. Kyphosis noted. Hyperinflation with flattening of the diaphragms. No pleural effusion or thickening appreciated. No parenchymal opacity or nodule appreciated. Heart normal in size. Mediastinum normal in contour.  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 Spirometry significantly worse with obstruction today. Patient still using tobacco. Needs inhaled medications to help improve pulmonary function. Patient instructed to notify me if she has any new breathing problems or questions.   1. Very severe COPD w/ Emphysema: Re-trying Spiriva Respimat.  2. Chronic Bronchitis: Secondary to tobacco use. 3. GERD: Starting Zantac or Pepcid qhs. 4. Ongoing Tobacco Use: Counseled for over 3 minutes on the need to completely quit smoking. 5. Health maintenance: Declined immunization previously.  6. Follow-up: return to clinic in 3 months or sooner if needed.   Sonia Baller Ashok Cordia, M.D. [facility] Pulmonary & Critical Care Pager:  [phone] After 3pm or if no response, call [phone] 12:09 PM 08/27/2015

## 2015-08-27 NOTE — Patient Instructions (Addendum)
   Try taking Zantac (Ranitidine) 150 mg or Pepcid (Famotidine) 40mg  at night before bed to help with your reflux.  Use the Spiriva on a daily basis.   I will see you back in 3 months or sooner if needed.

## 2015-08-27 NOTE — Addendum Note (Signed)
Addended by: Desmond Dike C on: 08/27/2015 12:36 PM   Modules accepted: Orders

## 2015-08-27 NOTE — Addendum Note (Signed)
Addended by: Raymondo Band D on: 08/27/2015 12:51 PM   Modules accepted: Orders, Medications

## 2015-08-27 NOTE — Progress Notes (Signed)
Spirometry pre and post and dlco done today. 

## 2015-08-27 NOTE — Telephone Encounter (Signed)
Left message on voicemail per DPR in reference to upcoming appointment scheduled on 08/27/11 with detailed instructions given per Myocardial Perfusion Study Information Sheet for the test. LM to arrive 15 minutes early, and that it is imperative to arrive on time for appointment to keep from having the test rescheduled. If you need to cancel or reschedule your appointment, please call the office within 24 hours of your appointment. Failure to do so may result in a cancellation of your appointment, and a $50 no show fee. Phone number given for call back for any questions. Hubbard Robinson, RN

## 2015-08-27 NOTE — Progress Notes (Signed)
Patient education given on Spiriva Respimat and the patient expresses understanding and acceptance of instructions. Destiny Hughes 08/27/2015 12:50 PM

## 2015-08-27 NOTE — Addendum Note (Signed)
Addended by: Raymondo Band D on: 08/27/2015 12:32 PM   Modules accepted: Orders

## 2015-08-28 DIAGNOSIS — Z79891 Long term (current) use of opiate analgesic: Secondary | ICD-10-CM | POA: Diagnosis not present

## 2015-08-28 DIAGNOSIS — M961 Postlaminectomy syndrome, not elsewhere classified: Secondary | ICD-10-CM | POA: Diagnosis not present

## 2015-08-28 DIAGNOSIS — G894 Chronic pain syndrome: Secondary | ICD-10-CM | POA: Diagnosis not present

## 2015-08-31 DIAGNOSIS — H04123 Dry eye syndrome of bilateral lacrimal glands: Secondary | ICD-10-CM | POA: Diagnosis not present

## 2015-08-31 DIAGNOSIS — H01024 Squamous blepharitis left upper eyelid: Secondary | ICD-10-CM | POA: Diagnosis not present

## 2015-08-31 DIAGNOSIS — H40013 Open angle with borderline findings, low risk, bilateral: Secondary | ICD-10-CM | POA: Diagnosis not present

## 2015-08-31 DIAGNOSIS — H25813 Combined forms of age-related cataract, bilateral: Secondary | ICD-10-CM | POA: Diagnosis not present

## 2015-08-31 DIAGNOSIS — H01021 Squamous blepharitis right upper eyelid: Secondary | ICD-10-CM | POA: Diagnosis not present

## 2015-08-31 DIAGNOSIS — H01022 Squamous blepharitis right lower eyelid: Secondary | ICD-10-CM | POA: Diagnosis not present

## 2015-08-31 DIAGNOSIS — H01025 Squamous blepharitis left lower eyelid: Secondary | ICD-10-CM | POA: Diagnosis not present

## 2015-09-01 ENCOUNTER — Ambulatory Visit (HOSPITAL_COMMUNITY): Payer: Medicare Other | Attending: Internal Medicine

## 2015-09-01 ENCOUNTER — Other Ambulatory Visit: Payer: Self-pay

## 2015-09-01 ENCOUNTER — Other Ambulatory Visit (INDEPENDENT_AMBULATORY_CARE_PROVIDER_SITE_OTHER): Payer: Medicare Other | Admitting: *Deleted

## 2015-09-01 ENCOUNTER — Ambulatory Visit (HOSPITAL_BASED_OUTPATIENT_CLINIC_OR_DEPARTMENT_OTHER): Payer: Medicare Other

## 2015-09-01 DIAGNOSIS — R0602 Shortness of breath: Secondary | ICD-10-CM

## 2015-09-01 DIAGNOSIS — I10 Essential (primary) hypertension: Secondary | ICD-10-CM

## 2015-09-01 DIAGNOSIS — E785 Hyperlipidemia, unspecified: Secondary | ICD-10-CM | POA: Insufficient documentation

## 2015-09-01 DIAGNOSIS — E559 Vitamin D deficiency, unspecified: Secondary | ICD-10-CM

## 2015-09-01 DIAGNOSIS — R079 Chest pain, unspecified: Secondary | ICD-10-CM | POA: Diagnosis not present

## 2015-09-01 DIAGNOSIS — I119 Hypertensive heart disease without heart failure: Secondary | ICD-10-CM | POA: Diagnosis not present

## 2015-09-01 DIAGNOSIS — I340 Nonrheumatic mitral (valve) insufficiency: Secondary | ICD-10-CM | POA: Diagnosis not present

## 2015-09-01 DIAGNOSIS — Z720 Tobacco use: Secondary | ICD-10-CM | POA: Insufficient documentation

## 2015-09-01 DIAGNOSIS — I348 Other nonrheumatic mitral valve disorders: Secondary | ICD-10-CM | POA: Diagnosis not present

## 2015-09-01 DIAGNOSIS — I341 Nonrheumatic mitral (valve) prolapse: Secondary | ICD-10-CM | POA: Diagnosis not present

## 2015-09-01 LAB — ECHOCARDIOGRAM COMPLETE
AOASC: 30 cm
AVLVOTPG: 5 mmHg
CHL CUP MV DEC (S): 155
EERAT: 20.76
EWDT: 155 ms
FS: 35 % (ref 28–44)
IVS/LV PW RATIO, ED: 0.98
LA diam end sys: 30 mm
LA vol A4C: 38.8 ml
LA vol: 41.2 mL
LADIAMINDEX: 1.62 cm/m2
LASIZE: 30 mm
LAVOLIN: 22.3 mL/m2
LV E/e' medial: 20.76
LV PW d: 11 mm — AB (ref 0.6–1.1)
LVEEAVG: 20.76
LVELAT: 7.13 cm/s
LVOT VTI: 33.1 cm
LVOT area: 2.54 cm2
LVOT diameter: 18 mm
LVOT peak vel: 114 cm/s
LVOTSV: 84 mL
MV Peak grad: 9 mmHg
MV pk A vel: 66.7 m/s
MVPKEVEL: 148 m/s
RV LATERAL S' VELOCITY: 11.4 cm/s
TAPSE: 24.4 mm
TDI e' lateral: 7.13
TDI e' medial: 6.75

## 2015-09-01 LAB — BASIC METABOLIC PANEL
BUN: 18 mg/dL (ref 7–25)
CHLORIDE: 99 mmol/L (ref 98–110)
CO2: 27 mmol/L (ref 20–31)
CREATININE: 1.04 mg/dL — AB (ref 0.60–0.93)
Calcium: 8.8 mg/dL (ref 8.6–10.4)
Glucose, Bld: 83 mg/dL (ref 65–99)
Potassium: 4.8 mmol/L (ref 3.5–5.3)
Sodium: 136 mmol/L (ref 135–146)

## 2015-09-01 MED ORDER — TECHNETIUM TC 99M TETROFOSMIN IV KIT
33.0000 | PACK | Freq: Once | INTRAVENOUS | Status: AC | PRN
  Administered 2015-09-01: 33 via INTRAVENOUS
  Filled 2015-09-01: qty 33

## 2015-09-01 NOTE — Addendum Note (Signed)
Addended by: Eulis Foster on: 09/01/2015 11:00 AM   Modules accepted: Orders

## 2015-09-01 NOTE — Addendum Note (Signed)
Addended by: Eulis Foster on: 09/01/2015 10:59 AM   Modules accepted: Orders

## 2015-09-02 LAB — VITAMIN D 25 HYDROXY (VIT D DEFICIENCY, FRACTURES): Vit D, 25-Hydroxy: 64 ng/mL (ref 30–100)

## 2015-09-03 ENCOUNTER — Telehealth (HOSPITAL_COMMUNITY): Payer: Self-pay | Admitting: *Deleted

## 2015-09-03 NOTE — Telephone Encounter (Signed)
Patient given detailed instructions per Myocardial Perfusion Study Information Sheet for the test on 09/08/15 at 1230. Patient notified to arrive 15 minutes early and that it is imperative to arrive on time for appointment to keep from having the test rescheduled.  If you need to cancel or reschedule your appointment, please call the office within 24 hours of your appointment. Failure to do so may result in a cancellation of your appointment, and a $50 no show fee. Patient verbalized understanding.Carold Eisner, Ranae Palms

## 2015-09-04 ENCOUNTER — Telehealth: Payer: Self-pay | Admitting: *Deleted

## 2015-09-04 NOTE — Telephone Encounter (Signed)
Unable to reach pt or leave a message  

## 2015-09-04 NOTE — Telephone Encounter (Signed)
-----   Message from Skeet Latch, MD sent at 09/04/2015  7:59 AM EDT ----- Normal EF.  Her heart is not relaxing completely, which can cause shortness of breath.  Start lasix 20 mg daily and scheduled follow up to discuss.

## 2015-09-07 MED ORDER — FUROSEMIDE 20 MG PO TABS: 20.0000 mg | ORAL_TABLET | Freq: Every day | ORAL | Status: DC

## 2015-09-07 NOTE — Telephone Encounter (Signed)
Advised patient and scheduled follow up ov  Rx sent to [facility]

## 2015-09-07 NOTE — Telephone Encounter (Deleted)
-----   Message from Skeet Latch, MD sent at 09/04/2015  7:59 AM EDT ----- Normal EF.  Her heart is not relaxing completely, which can cause shortness of breath.  Start lasix 20 mg daily and scheduled follow up to discuss.

## 2015-09-07 NOTE — Telephone Encounter (Signed)
-----   Message from Skeet Latch, MD sent at 09/04/2015  2:38 PM EDT ----- Normal vitamin D levels, kidney function and electrolytes.  Continue lisinopril and all other medications.

## 2015-09-08 ENCOUNTER — Ambulatory Visit (HOSPITAL_COMMUNITY): Payer: Medicare Other

## 2015-09-08 DIAGNOSIS — R079 Chest pain, unspecified: Secondary | ICD-10-CM

## 2015-09-08 DIAGNOSIS — R0602 Shortness of breath: Secondary | ICD-10-CM | POA: Diagnosis not present

## 2015-09-08 DIAGNOSIS — Z720 Tobacco use: Secondary | ICD-10-CM | POA: Diagnosis not present

## 2015-09-08 DIAGNOSIS — I119 Hypertensive heart disease without heart failure: Secondary | ICD-10-CM | POA: Diagnosis not present

## 2015-09-08 DIAGNOSIS — I340 Nonrheumatic mitral (valve) insufficiency: Secondary | ICD-10-CM | POA: Diagnosis not present

## 2015-09-08 DIAGNOSIS — E785 Hyperlipidemia, unspecified: Secondary | ICD-10-CM | POA: Diagnosis not present

## 2015-09-08 MED ORDER — TECHNETIUM TC 99M TETROFOSMIN IV KIT
30.0000 | PACK | Freq: Once | INTRAVENOUS | Status: AC | PRN
  Administered 2015-09-08: 30 via INTRAVENOUS
  Filled 2015-09-08: qty 30

## 2015-09-08 MED ORDER — REGADENOSON 0.4 MG/5ML IV SOLN
0.4000 mg | Freq: Once | INTRAVENOUS | Status: AC
  Administered 2015-09-08: 0.4 mg via INTRAVENOUS

## 2015-09-09 LAB — MYOCARDIAL PERFUSION IMAGING
CHL CUP NUCLEAR SDS: 0
CHL CUP RESTING HR STRESS: 49 {beats}/min
CHL CUP STRESS STAGE 1 DBP: 78 mmHg
CHL CUP STRESS STAGE 1 GRADE: 0 %
CHL CUP STRESS STAGE 1 HR: 49 {beats}/min
CHL CUP STRESS STAGE 1 SBP: 147 mmHg
CHL CUP STRESS STAGE 2 SPEED: 0 mph
CHL CUP STRESS STAGE 3 GRADE: 0 %
CHL CUP STRESS STAGE 3 HR: 65 {beats}/min
CHL CUP STRESS STAGE 3 SPEED: 0 mph
CHL CUP STRESS STAGE 4 GRADE: 0 %
CHL CUP STRESS STAGE 4 HR: 73 {beats}/min
CHL CUP STRESS STAGE 4 SBP: 153 mmHg
CHL CUP STRESS STAGE 5 DBP: 77 mmHg
CHL CUP STRESS STAGE 5 GRADE: 0 %
CHL CUP STRESS STAGE 6 GRADE: 0 %
CSEPEW: 1 METS
CSEPPMHR: 48 %
LV sys vol: 37 mL
LVDIAVOL: 97 mL (ref 46–106)
Peak BP: 153 mmHg
Peak HR: 73 {beats}/min
RATE: 0.23
SRS: 1
SSS: 1
Stage 1 Speed: 0 mph
Stage 2 Grade: 0 %
Stage 2 HR: 49 {beats}/min
Stage 4 DBP: 79 mmHg
Stage 4 Speed: 0 mph
Stage 5 HR: 73 {beats}/min
Stage 5 SBP: 155 mmHg
Stage 5 Speed: 0 mph
Stage 6 DBP: 76 mmHg
Stage 6 HR: 71 {beats}/min
Stage 6 SBP: 151 mmHg
Stage 6 Speed: 0 mph
TID: 1

## 2015-09-28 DIAGNOSIS — M961 Postlaminectomy syndrome, not elsewhere classified: Secondary | ICD-10-CM | POA: Diagnosis not present

## 2015-09-28 DIAGNOSIS — Z79891 Long term (current) use of opiate analgesic: Secondary | ICD-10-CM | POA: Diagnosis not present

## 2015-09-28 DIAGNOSIS — G894 Chronic pain syndrome: Secondary | ICD-10-CM | POA: Diagnosis not present

## 2015-09-28 DIAGNOSIS — M5481 Occipital neuralgia: Secondary | ICD-10-CM | POA: Diagnosis not present

## 2015-10-07 ENCOUNTER — Encounter: Payer: Self-pay | Admitting: Cardiovascular Disease

## 2015-10-07 ENCOUNTER — Ambulatory Visit (INDEPENDENT_AMBULATORY_CARE_PROVIDER_SITE_OTHER): Payer: Medicare Other | Admitting: Cardiovascular Disease

## 2015-10-07 VITALS — BP 115/67 | HR 52 | Ht 66.0 in | Wt 156.8 lb

## 2015-10-07 DIAGNOSIS — W19XXXA Unspecified fall, initial encounter: Secondary | ICD-10-CM | POA: Diagnosis not present

## 2015-10-07 DIAGNOSIS — Z79899 Other long term (current) drug therapy: Secondary | ICD-10-CM

## 2015-10-07 DIAGNOSIS — I10 Essential (primary) hypertension: Secondary | ICD-10-CM

## 2015-10-07 DIAGNOSIS — R002 Palpitations: Secondary | ICD-10-CM | POA: Diagnosis not present

## 2015-10-07 DIAGNOSIS — Z720 Tobacco use: Secondary | ICD-10-CM

## 2015-10-07 NOTE — Progress Notes (Signed)
Cardiology Office Note   Date:  10/07/2015   ID:  Destiny Hughes, DOB 1945-04-02, MRN [id number]  PCP:  No PCP Per Patient  Cardiologist:   Skeet Latch, MD   Chief Complaint  Patient presents with  . Follow-up    pt denied chest pain, pt c/o SOB and swelling in legs and feet     History of Present Illness: Destiny Hughes is a 70 y.o. female with hypertension, mitral valve prolapse, COPD, hyperlipidemia, and hypothyroidism who presents for follow up.   She had a normal adenosine Cardiolite in 2007 and in 2011.  She also had an echo 04/1998 that didn't show any MVP.  Her echo 08/10/09 showed mild aortic sclerosis, normal systolic function and abnormal diastolic function.  Destiny Hughes was previously a patient of Dr. Mare Ferrari.  She was last seen 07/2015, at which time her PCP had recommended switching from propranolol to atenolol. She was reluctant to do so because her palpitations have been well-controlled on propranolol. Therefore, we continued the propranolol and added lisinopril 10 mg daily. She reported some atypical chest pain and was referred for a Lexiscan Myoview /2017 that was negative for ischemia.  She also had an echo for shortness of breath which revealed LVEF 60-65% with grade 2 diastolic dysfunction and a mildly dilated right ventricle that was also hypokinetic. Her IVC was noted to be dilated. She was started on Lasix 20 mg oral daily.  Since starting Lasix and lisinopril Destiny Hughes has been feeling better. She notes an improvement in her breathing and her lower extremity edema. She has suffered 2 falls that it occured in the setting of her knees giving out underneath her.  There is no preceding chest pain, shortness of breath, or palpitations. She reports pain in her right foot and has noted bruising over the dorsum of her foot in between the fourth and fifth toes. She has pain with ambulation. She continues to have bilateral knee pain which is chronic. She is  unable to exercise due to this pain. She notes a couple episodes of palpitations but she was able to break them easily with deep breathing. She denies any syncope. She has noted some orthopnea, though she attributes this to her COPD. She continues to smoke 1/2 pack of cigarettes daily.   Past Medical History  Diagnosis Date  . Palpitation   . Tachycardia   . Dyspnea   . Hyperlipemia   . Glaucoma   . Chronic neck pain   . Chronic back pain   . Collapse of right lung   . Hypothyroidism     tx. Levothyroxine  . Blood transfusion     after Thoracic surgery ?'04  . GERD (gastroesophageal reflux disease)     tx. TUMS  . Depression   . COPD (chronic obstructive pulmonary disease) (HCC)     Hx. Bronchitis occ, 01-25-11 somes issue now taking  Z-pak-started 01-24-11/Scar tissue  present  from previous lung collapse  . Heart murmur   . Headache(784.0) 01-25-11    neck and nerve pain related.  . Arthritis 01-25-11    back and most joints  . Neuromuscular disorder ([place]) 01-25-11    Pain/nerve stimulator implanted -2 yrs ago.  Marland Kitchen Dysrhythmia     hx.PAT- tx. Inderal  . Nephrolithiasis   . Emphysema of lung [facility])     Past Surgical History  Procedure Laterality Date  . Spinal cord stimulator implant  2009 APPROX    DR. ELSNER  .  Cervical spine surgery  01-25-11    2005-multiple levels  . Thoracic spine surgery  01-25-11    6'02- then nerve stimulator implanted after  . Tubal ligation    . Appendectomy  01-25-11  . Joint replacement  01-25-11    6'03 LTHA-hemi  . Knee arthroplasty  01-25-11    '04-right, revised x2  . Cholecystectomy  01-25-11    '07  . Carpal tunnel release  01-25-11    Bil.  . Total knee revision  02/01/2011    Procedure: TOTAL KNEE REVISION;  Surgeon: Mauri Pole;  Location: [facility];  Service: Orthopedics;  Laterality: Right;  . Colonoscopy with propofol N/A 07/23/2012    Procedure: COLONOSCOPY WITH PROPOFOL;  Surgeon: Garlan Fair, MD;  Location: [facility];   Service: Endoscopy;  Laterality: N/A;     Current Outpatient Prescriptions  Medication Sig Dispense Refill  . Biotin (BIOTIN 5000) 5 MG CAPS Take 1 capsule by mouth daily.      . Cholecalciferol (VITAMIN D PO) Take 5,000 Units by mouth daily.    . diazepam (VALIUM) 5 MG tablet Take 5 mg by mouth every 6 (six) hours as needed (for muscle spams).     Marland Kitchen DIGOX 250 MCG tablet TAKE 1 TABLET BY MOUTH EVERY MORNING 90 tablet 1  . furosemide (LASIX) 20 MG tablet Take 1 tablet (20 mg total) by mouth daily. 30 tablet 5  . levothyroxine (SYNTHROID, LEVOTHROID) 150 MCG tablet TAKE 1 TABLET BY MOUTH EVERY DAY 30 TO 60 MINUTES BEFIORE EATING AND TAKE 1.5 TABLETS BY MOUTH ON TUESDAY AND THURSDAY 20 tablet 0  . lisinopril (PRINIVIL,ZESTRIL) 10 MG tablet Take 1 tablet (10 mg total) by mouth daily. 30 tablet 5  . methadone (DOLOPHINE) 5 MG tablet Take 5 mg by mouth every 6 (six) hours.     Marland Kitchen OVER THE COUNTER MEDICATION OTC Flonase nasal spray PRN    . oxyCODONE (ROXICODONE) 15 MG immediate release tablet Take 1 tablet by mouth every 6 (six) hours as needed.  0  . propranolol (INDERAL) 80 MG tablet Take 1 tablet by mouth 2 (two) times daily. For blood pressure.  1  . sertraline (ZOLOFT) 100 MG tablet Take 150 mg by mouth every morning.     . [DISCONTINUED] ferrous sulfate 325 (65 FE) MG tablet Take 1 tablet (325 mg total) by mouth 3 (three) times daily after meals.    . [DISCONTINUED] rivaroxaban (XARELTO) 10 MG TABS tablet Take 1 tablet (10 mg total) by mouth daily. 12 tablet    No current facility-administered medications for this visit.    Allergies:   Ventolin; Albuterol; Codeine; Penicillins; and Ecotrin    Social History:  The patient  reports that she has been smoking Cigarettes.  She started smoking about 54 years ago. She has a 37.5 pack-year smoking history. She has never used smokeless tobacco. She reports that she does not drink alcohol or use illicit drugs.   Family History:  The patient's  family history includes Aneurysm in her mother; Asthma in her son; Breast cancer in her paternal aunt; Diabetes in her son; Emphysema in her father and mother; Heart attack in her father; Hypertension in her son and son; Rheum arthritis in her maternal aunt.    ROS:  Please see the history of present illness.   Otherwise, review of systems are positive for dysphagia, knee pain, back pain.   All other systems are reviewed and negative.    PHYSICAL EXAM: VS:  BP  115/67 mmHg  Pulse 52  Ht 5\' 6"  (1.676 m)  Wt 156 lb 12.8 oz (71.124 kg)  BMI 25.32 kg/m2 , BMI Body mass index is 25.32 kg/(m^2). GENERAL:  Well appearing HEENT:  Pupils equal round and reactive, fundi not visualized, oral mucosa unremarkable NECK:  No jugular venous distention, waveform within normal limits, carotid upstroke brisk and symmetric, no bruits, no thyromegaly LYMPHATICS:  No cervical adenopathy LUNGS:  Clear to auscultation bilaterally HEART:  RRR.  PMI not displaced or sustained,S1 and S2 within normal limits, no S3, no S4, no clicks, no rubs, no murmurs ABD:  Flat, positive bowel sounds normal in frequency in pitch, no bruits, no rebound, no guarding, no midline pulsatile mass, no hepatomegaly, no splenomegaly EXT:  2 plus pulses throughout, no edema, no cyanosis no clubbing SKIN:  No rashes no nodules NEURO:  Cranial nerves II through XII grossly intact, motor grossly intact throughout PSYCH:  Cognitively intact, oriented to person place and time   EKG:  EKG is ordered today. The ekg ordered today demonstrates sinus bradycardia. Rate 53 bpm. Left anterior fascicular block. Nonspecific ST changes.  Lexiscan Myoview 09/08/15:  Nuclear stress EF: 62%.  There was no ST segment deviation noted during stress.  The study is normal.  This is a low risk study.  The left ventricular ejection fraction is normal (55-65%).  Echo 09/01/15: Study Conclusions  - Left ventricle: The cavity size was normal. Wall  thickness was  normal. Systolic function was normal. The estimated ejection  fraction was in the range of 60% to 65%. Wall motion was normal;  there were no regional wall motion abnormalities. Doppler  parameters are consistent with pseudonormal left ventricular  relaxation (grade 2 diastolic dysfunction). The E/e&' ratio is  >15, suggesting elevated LV filling pressure. - Mitral valve: Mildly thickened leaflets . There was mild  regurgitation. - Left atrium: The atrium was normal in size. - Right ventricle: The cavity size was mildly dilated. Systolic  function is reduced. - Right atrium: The atrium was mildly dilated. - Inferior vena cava: The vessel was dilated. The respirophasic  diameter changes were blunted (< 50%), consistent with elevated  central venous pressure.  Impressions:  - LVEF 60-65%, normal wall thickness, normal wall motion, stage 2  DD with elevated LV filling pressure, mild MR, normal LA size,  mild RAE, dilated RV with reduced systolic function, dilated IVC,  no TR.   Recent Labs: 04/28/2015: ALT 15; Hemoglobin 16.2*; Magnesium 1.8; Platelets 280; TSH 1.04 09/01/2015: BUN 18; Creat 1.04*; Potassium 4.8; Sodium 136    Lipid Panel    Component Value Date/Time   CHOL 195 04/28/2015 1525   TRIG 188* 04/28/2015 1525   HDL 43* 04/28/2015 1525   CHOLHDL 4.5 04/28/2015 1525   VLDL 38* 04/28/2015 1525   LDLCALC 114 04/28/2015 1525      Wt Readings from Last 3 Encounters:  10/07/15 156 lb 12.8 oz (71.124 kg)  09/01/15 156 lb (70.761 kg)  08/27/15 160 lb (72.576 kg)      ASSESSMENT AND PLAN:  # Palpitations: Symptoms are well-controlled on propranolol and digoxin. We will not make any changes at this time. We will check a digoxin level prior to her next dose.  Potassium was 4.8 on 08/2015.   # Hypertension: Blood pressure is well-controlled. Continue lisinopril and propranolol.  # Tobacco abuse: Ms. Braegger continues to smoke and is  not interested in pharmacologic assistance.   # Chest pain:  # Shortness of breath:  Symptoms have resolved. Stress testing was negative for ischemia and her echo showed grade 2 diastolic dysfunction. Her breathing has improved with the addition of lasix.    Current medicines are reviewed at length with the patient today.  The patient does not have concerns regarding medicines.  The following changes have been made:  No changes.  Labs/ tests ordered today include:   Orders Placed This Encounter  Procedures  . DG Foot Complete Right  . Digoxin level    Disposition:   FU with Kailoni Vahle C. Oval Linsey, MD, [facility] in 4 months.    This note was written with the assistance of speech recognition software.  Please excuse any transcriptional errors.  Signed, Andie Mortimer C. Oval Linsey, MD, [name]  10/07/2015 1:33 PM     [facility]

## 2015-10-07 NOTE — Patient Instructions (Signed)
Lab work  Your physician recommends that you return for lab work.   Procedures  Right Foot X-Ray at [place] [phone].    Follow-up  Your physician recommends that you schedule a follow-up appointment in: 4 months with Dr. Oval Linsey.  If you need a refill on your cardiac medications before your next appointment, please call your pharmacy.

## 2015-10-11 ENCOUNTER — Other Ambulatory Visit: Payer: Self-pay | Admitting: Internal Medicine

## 2015-10-13 ENCOUNTER — Other Ambulatory Visit: Payer: Self-pay | Admitting: Pulmonary Disease

## 2015-10-13 ENCOUNTER — Encounter: Payer: Self-pay | Admitting: Pulmonary Disease

## 2015-10-13 ENCOUNTER — Ambulatory Visit
Admission: RE | Admit: 2015-10-13 | Discharge: 2015-10-13 | Disposition: A | Discharge status: Home / Self Care | Payer: Medicare Other | Source: Ambulatory Visit | Attending: Cardiovascular Disease | Admitting: Cardiovascular Disease

## 2015-10-13 ENCOUNTER — Ambulatory Visit (INDEPENDENT_AMBULATORY_CARE_PROVIDER_SITE_OTHER)
Admission: RE | Admit: 2015-10-13 | Discharge: 2015-10-13 | Disposition: A | Discharge status: Home / Self Care | Payer: Medicare Other | Source: Ambulatory Visit | Attending: Pulmonary Disease | Admitting: Pulmonary Disease

## 2015-10-13 ENCOUNTER — Ambulatory Visit (INDEPENDENT_AMBULATORY_CARE_PROVIDER_SITE_OTHER): Payer: Medicare Other | Admitting: Pulmonary Disease

## 2015-10-13 VITALS — BP 112/72 | HR 56 | Ht 66.0 in | Wt 154.0 lb

## 2015-10-13 DIAGNOSIS — I5030 Unspecified diastolic (congestive) heart failure: Secondary | ICD-10-CM | POA: Insufficient documentation

## 2015-10-13 DIAGNOSIS — K219 Gastro-esophageal reflux disease without esophagitis: Secondary | ICD-10-CM | POA: Diagnosis not present

## 2015-10-13 DIAGNOSIS — J439 Emphysema, unspecified: Secondary | ICD-10-CM

## 2015-10-13 DIAGNOSIS — S92354A Nondisplaced fracture of fifth metatarsal bone, right foot, initial encounter for closed fracture: Secondary | ICD-10-CM | POA: Diagnosis not present

## 2015-10-13 DIAGNOSIS — I5032 Chronic diastolic (congestive) heart failure: Secondary | ICD-10-CM

## 2015-10-13 DIAGNOSIS — R05 Cough: Secondary | ICD-10-CM | POA: Diagnosis not present

## 2015-10-13 DIAGNOSIS — J9601 Acute respiratory failure with hypoxia: Secondary | ICD-10-CM

## 2015-10-13 DIAGNOSIS — R0602 Shortness of breath: Secondary | ICD-10-CM | POA: Diagnosis not present

## 2015-10-13 DIAGNOSIS — F17200 Nicotine dependence, unspecified, uncomplicated: Secondary | ICD-10-CM

## 2015-10-13 DIAGNOSIS — J449 Chronic obstructive pulmonary disease, unspecified: Secondary | ICD-10-CM

## 2015-10-13 DIAGNOSIS — W19XXXA Unspecified fall, initial encounter: Secondary | ICD-10-CM

## 2015-10-13 LAB — CBC WITH DIFFERENTIAL/PLATELET
BASOS PCT: 0 %
Basophils Absolute: 0 cells/uL (ref 0–200)
EOS PCT: 2 %
Eosinophils Absolute: 206 cells/uL (ref 15–500)
HCT: 48.5 % — ABNORMAL HIGH (ref 35.0–45.0)
Hemoglobin: 16.4 g/dL — ABNORMAL HIGH (ref 11.7–15.5)
LYMPHS ABS: 2781 {cells}/uL (ref 850–3900)
LYMPHS PCT: 27 %
MCH: 30.3 pg (ref 27.0–33.0)
MCHC: 33.8 g/dL (ref 32.0–36.0)
MCV: 89.5 fL (ref 80.0–100.0)
MONO ABS: 824 {cells}/uL (ref 200–950)
MPV: 10 fL (ref 7.5–12.5)
Monocytes Relative: 8 %
NEUTROS PCT: 63 %
Neutro Abs: 6489 cells/uL (ref 1500–7800)
Platelets: 253 10*3/uL (ref 140–400)
RBC: 5.42 MIL/uL — AB (ref 3.80–5.10)
RDW: 14.3 % (ref 11.0–15.0)
WBC: 10.3 10*3/uL (ref 3.8–10.8)

## 2015-10-13 NOTE — Patient Instructions (Addendum)
   We will do a walking test at your next appointment.  Remember to limit yourself to 2 quarts of liquid total daily and 2000mg  of sodium/salt daily. Remember to weigh yourself daily & let Dr. Oval Linsey know if your weight fluctuates by more than 4 pounds.  I will see you back in September or sooner if needed.  TESTS ORDERED: 1. 6 minute walk test with oxygen titration and forehead probe at next appointment 2. CBC today at [employer] 3. CXR PA/LAT today

## 2015-10-13 NOTE — Progress Notes (Signed)
Subjective:    Patient ID: Destiny Hughes, female    DOB: 01-Jun-1945, 70 y.o.   MRN: [id number]  C.C.:  Follow-up for Very Severe COPD w/ Emphysema, Chronic Bronchitis, GERD, & Ongoing Tobacco Use.  HPI  On arrival today patient was hypoxic to 87% on RA. She required 2 L/m to maintain her saturation. With utilizing forehead probe today patient had no hypoxia at rest after a period or rest or desaturation with a short ambulation in our hallway.   Very Severe COPD w/ Emphysema: Previously was taking Theophylline. Previously unable to afford Spiriva or [place]. Tried on Spiriva Respimat at last appointment. She reports her cough seemed to worsen on the inhaler. She reports her dyspnea started worsening almost immediately after last appointment and she feels it is progressively worsening. She is waking up at night coughing.   Chronic Bronchitis:  Likely secondary to tobacco use. She reports her cough is productive of anything from a green-yellow mucus to a clear mucus. She admits she is having increased sinus congestion & drainage. Denies any hemoptysis.   GERD: Prescribed Zantac but previously was taking Pepcid intermittently. She reports she is having more frequent dysphagia. She reports she is having some odynophagia with taking pills. She is taking her pills with yogurt to help her swallow.   Ongoing Tobacco Use:  She reports she is continuing to smoke. She reports she is "at least" smoking "1/2" ppd.   Review of Systems  She reports some orthopnea and increased lower extremity edema. She was started on Lasix. She reports she has had intermittent and chronic chest pain that is unchanged. No new chest pressure or tightness. No fever or chills but is having sweats intermittently.  Allergies  Allergen Reactions  . Ventolin [Kdc:Albuterol] Anaphylaxis  . Albuterol     Had difficulty breathing after a nebulizer treatment  . Codeine Nausea And Vomiting  . Penicillins Swelling  . Ecotrin  [Aspirin] Nausea And Vomiting   Current Outpatient Prescriptions on File Prior to Visit  Medication Sig Dispense Refill  . Biotin (BIOTIN 5000) 5 MG CAPS Take 1 capsule by mouth daily.      . Cholecalciferol (VITAMIN D PO) Take 5,000 Units by mouth daily.    . diazepam (VALIUM) 5 MG tablet Take 5 mg by mouth every 6 (six) hours as needed (for muscle spams).     Marland Kitchen DIGOX 250 MCG tablet TAKE 1 TABLET BY MOUTH EVERY MORNING 90 tablet 1  . furosemide (LASIX) 20 MG tablet Take 1 tablet (20 mg total) by mouth daily. 30 tablet 5  . levothyroxine (SYNTHROID, LEVOTHROID) 150 MCG tablet TAKE 1 TABLET BY MOUTH EVERY DAY 30 TO 60 MINUTES BEFIORE EATING AND TAKE 1.5 TABLETS BY MOUTH ON TUESDAY AND THURSDAY 20 tablet 0  . lisinopril (PRINIVIL,ZESTRIL) 10 MG tablet Take 1 tablet (10 mg total) by mouth daily. 30 tablet 5  . methadone (DOLOPHINE) 5 MG tablet Take 5 mg by mouth every 6 (six) hours.     Marland Kitchen OVER THE COUNTER MEDICATION OTC Flonase nasal spray PRN    . oxyCODONE (ROXICODONE) 15 MG immediate release tablet Take 1 tablet by mouth every 6 (six) hours as needed.  0  . propranolol (INDERAL) 80 MG tablet TAKE 1 TABLET BY MOUTH TWICE DAILY FOR BLOOD PRESSURE 180 tablet 0  . sertraline (ZOLOFT) 100 MG tablet Take 150 mg by mouth every morning.     . [DISCONTINUED] ferrous sulfate 325 (65 FE) MG tablet Take 1 tablet (325  mg total) by mouth 3 (three) times daily after meals.    . [DISCONTINUED] rivaroxaban (XARELTO) 10 MG TABS tablet Take 1 tablet (10 mg total) by mouth daily. 12 tablet    No current facility-administered medications on file prior to visit.    Past Medical History:  Diagnosis Date  . Arthritis 01-25-11   back and most joints  . Blood transfusion    after Thoracic surgery ?'04  . Chronic back pain   . Chronic neck pain   . Collapse of right lung   . COPD (chronic obstructive pulmonary disease) (HCC)    Hx. Bronchitis occ, 01-25-11 somes issue now taking  Z-pak-started 01-24-11/Scar tissue   present  from previous lung collapse  . Depression   . Dyspnea   . Dysrhythmia    hx.PAT- tx. Inderal  . Emphysema of lung ([place])   . GERD (gastroesophageal reflux disease)    tx. TUMS  . Glaucoma   . Headache(784.0) 01-25-11   neck and nerve pain related.  Marland Kitchen Heart murmur   . Hyperlipemia   . Hypothyroidism    tx. Levothyroxine  . Nephrolithiasis   . Neuromuscular disorder ([place]) 01-25-11   Pain/nerve stimulator implanted -2 yrs ago.  . Palpitation   . Tachycardia    Past Surgical History:  Procedure Laterality Date  . APPENDECTOMY  01-25-11  . CARPAL TUNNEL RELEASE  01-25-11   Bil.  . CERVICAL SPINE SURGERY  01-25-11   2005-multiple levels  . CHOLECYSTECTOMY  01-25-11   '07  . COLONOSCOPY WITH PROPOFOL N/A 07/23/2012   Procedure: COLONOSCOPY WITH PROPOFOL;  Surgeon: Garlan Fair, MD;  [facility];  Service: Endoscopy;  Laterality: N/A;  . JOINT REPLACEMENT  01-25-11   6'03 LTHA-hemi  . KNEE ARTHROPLASTY  01-25-11   '04-right, revised x2  . SPINAL CORD STIMULATOR IMPLANT  2009 APPROX   DR. ELSNER  . THORACIC SPINE SURGERY  01-25-11   6'02- then nerve stimulator implanted after  . TOTAL KNEE REVISION  02/01/2011   Procedure: TOTAL KNEE REVISION;  Surgeon: Mauri Pole;  [facility]: [facility];  Service: Orthopedics;  Laterality: Right;  . TUBAL LIGATION     Family History  Problem Relation Age of Onset  . Heart attack Father   . Emphysema Father   . Aneurysm Mother     CEREBRAL  . Emphysema Mother   . Diabetes Son   . Hypertension Son   . Hypertension Son   . Asthma Son     x2  . Breast cancer Paternal Aunt   . Rheum arthritis Maternal Aunt    Social History   Social History  . Marital status: Married    Spouse name: N/A  . Number of children: 2  . Years of education: N/A   Occupational History  . disabled    Social History Main Topics  . Smoking status: Current Every Day Smoker    Packs/day: 0.75    Years: 50.00    Types: Cigarettes    Start  date: 09/13/1961  . Smokeless tobacco: Never Used     Comment: Peak rate of 1ppd  . Alcohol use No     Comment: none in 10 years  . Drug use: No  . Sexual activity: No   Other Topics Concern  . Not on file   Social History Narrative   She is from [facility]. She has always lived in [place]. She has traveled to [place], [place], [place], [place], & [place]. 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 Prior exposure to parakeets with last exposure remote in her current home. Previously had mold in her home that was in her bathroom & fixed. Prior exposure to hot tubs but none recently.       Objective:   Physical Exam BP 112/72 (BP Location: Left Arm, Cuff Size: Normal)   Pulse (!) 56   Ht 5\' 6"  (1.676 m)   Wt 154 lb (69.9 kg)   SpO2 90%   BMI 24.86 kg/m  General:  Awake. No distress. Husband with her today. Comfortable. Integument:  Warm & dry. No rash on exposed skin.  HEENT:  Moist mucus membranes. No oral ulcers. No scleral icterus. Cardiovascular:  Regular rate & rhythm. Trace edema. Pulmonary:  Symmetrically decreased aeration with minimal wheeze apically. Normal work of breathing at rest on room air and on oxygen. Musculoskeletal: Normal bulk and tone. No joint deformity appreciated.  PFT 08/27/15: FVC 1.09 L (34%) FEV1 0.75 L (31%) FEV1/FVC 0.69 FEF 25-75 0.49 L (25%)                                                               DLCO uncorrected 42% 12/26/14: FVC 1.23 L (38%) FEV1 0.78 L (32%) FEV1/FVC 0.64 FEF 25-75 0.43 L (21%) TLC 4.96 L (92%) RV 128% ERV 27% DLCO uncorrected 51%  IMAGING HRCT CHEST W/O 11/18/14 (previously reviewed by me): No bronchiectasis or intralobular septal thickening. Diffuse bronchial wall thickening as well as mild centrilobular and paraseptal emphysema upper lobe predominant. Mild tracheobronchomalacia with air trapping. Atherosclerosis noted by radiologist. No  mediastinal mass or pathologic adenopathy on my review. No pericardial effusion. No pleural effusion or thickening.  Barium Swallow (11/17/14): Mild esophageal dysmotility with mild GERD. No stricture, mass, or esophageal mucosal irregularity.  CXR PA/LAT 10/21/14 (previously reviewed by me): Upper thoracic spinal cord stimulator noted. Kyphosis noted. Hyperinflation with flattening of the diaphragms. No pleural effusion or thickening appreciated. No parenchymal opacity or nodule appreciated. Heart normal in size. Mediastinum normal in contour.  CARDIAC NUCLEAR STRESS TEST (09/08/15):  Rest and stress perfusion normal. LV EF 55-65%.   TTE (09/01/15):  LV normal in size. EF 60-65%. Grade 2 diastolic dysfunction. LA normal in size. RA mildly dilated. RV mildly dilated with mild reduction in systolic function. No AS or AR. Mild MR. No TR. No pericardial effusion.   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 Patient's level of dyspnea is continually worsening. She was hypoxic upon arrival but it's unclear whether or not this could've been secondary to the fact that it was taken with a finger pulse oximetry probe given the fact that her hypoxia was not present when a forehead probe was utilized and she was walking briefly. I did have a lengthy discussion with patient regarding her ongoing tobacco  use and further worsening of her pulmonary function. I also spent a significant amount of time today instructing her on the need for fluid restriction and monitoring of sodium intake in the setting of her  chronic diastolic congestive heart failure. We discussed starting prednisone therapy daily in an effort to control her symptoms as she is intolerant of multiple inhaled medications. I also explained that the use of theophylline is not recommended in her case and she has very severe COPD with emphysema and not underlying asthma. 5 instructed the patient to contact my office if she had any further questions or concerns. She and her husband had to leave before I could discuss results of her walk test today.  1. Very severe COPD w/ Emphysema: Patient intolerant of multiple inhaler medications. Declining Prednisone therapy.  2. Acute Hypoxic Respiratory Failure:  Resolved with rest & not evident on walking briefly today. Checking serum CBC & CXR PA/LAT today. Checking 6 to walk test with oxygen titration at next appointment with forehead probe. 3. Chronic Bronchitis: Secondary to tobacco use. 4. GERD: Recommended contacting Dr. Wynetta Emery to discuss her ongoing reflux. She is taking an OTC H2 blocker.  5. Ongoing Tobacco Use: Counseled for over 3 minutes on the need to completely quit smoking. 6. Health maintenance: Declined immunization previously.  7. Follow-up: Return to clinic in September as scheduled.  I spent over 30 minutes in the patient's room during the visit in face-to-face contact with the patient discussing her disease state and pathophysiology as well as treatment options and counseling her on the need for complete tobacco cessation.  Sonia Baller Ashok Cordia, M.D. [facility] Pager:  [phone] After 3pm or if no response, call [phone] 11:03 AM 10/13/15

## 2015-10-14 ENCOUNTER — Telehealth: Payer: Self-pay | Admitting: Cardiovascular Disease

## 2015-10-14 DIAGNOSIS — S92514A Nondisplaced fracture of proximal phalanx of right lesser toe(s), initial encounter for closed fracture: Secondary | ICD-10-CM | POA: Diagnosis not present

## 2015-10-14 NOTE — Telephone Encounter (Signed)
New message    Pt saw doctor on yesterday 10/13/15 and was told by the doctor after a foot injury to go and have it x-rayed. Pt would like to know if you want her to send a copy of the x-ray to the doctor. Please advise.

## 2015-10-14 NOTE — Telephone Encounter (Signed)
Spoke to patient-pt aware of xray results.  Reports she has appt today with orthopedic doctor at [facility] @ 130.  Advised to call with further questions or concerns.  Pt verbalized understanding.

## 2015-10-26 DIAGNOSIS — G894 Chronic pain syndrome: Secondary | ICD-10-CM | POA: Diagnosis not present

## 2015-10-26 DIAGNOSIS — Z79891 Long term (current) use of opiate analgesic: Secondary | ICD-10-CM | POA: Diagnosis not present

## 2015-10-26 DIAGNOSIS — M5481 Occipital neuralgia: Secondary | ICD-10-CM | POA: Diagnosis not present

## 2015-10-26 DIAGNOSIS — M961 Postlaminectomy syndrome, not elsewhere classified: Secondary | ICD-10-CM | POA: Diagnosis not present

## 2015-11-04 DIAGNOSIS — S92514D Nondisplaced fracture of proximal phalanx of right lesser toe(s), subsequent encounter for fracture with routine healing: Secondary | ICD-10-CM | POA: Diagnosis not present

## 2015-11-10 ENCOUNTER — Other Ambulatory Visit: Payer: Self-pay | Admitting: Internal Medicine

## 2015-11-10 DIAGNOSIS — E039 Hypothyroidism, unspecified: Secondary | ICD-10-CM

## 2015-11-13 ENCOUNTER — Ambulatory Visit: Payer: Medicare Other | Admitting: Cardiovascular Disease

## 2015-11-24 ENCOUNTER — Telehealth: Payer: Self-pay | Admitting: Pulmonary Disease

## 2015-11-24 NOTE — Telephone Encounter (Signed)
ATC pt, line rang numerous times and no answer. WCB

## 2015-11-25 ENCOUNTER — Ambulatory Visit (INDEPENDENT_AMBULATORY_CARE_PROVIDER_SITE_OTHER): Payer: Medicare Other | Admitting: Cardiovascular Disease

## 2015-11-25 ENCOUNTER — Encounter: Payer: Self-pay | Admitting: Cardiovascular Disease

## 2015-11-25 VITALS — BP 115/68 | HR 62 | Ht 66.0 in | Wt 157.0 lb

## 2015-11-25 DIAGNOSIS — Z79899 Other long term (current) drug therapy: Secondary | ICD-10-CM | POA: Diagnosis not present

## 2015-11-25 DIAGNOSIS — I10 Essential (primary) hypertension: Secondary | ICD-10-CM

## 2015-11-25 DIAGNOSIS — R002 Palpitations: Secondary | ICD-10-CM

## 2015-11-25 DIAGNOSIS — I5032 Chronic diastolic (congestive) heart failure: Secondary | ICD-10-CM | POA: Diagnosis not present

## 2015-11-25 NOTE — Telephone Encounter (Signed)
Spoke with pt, states she is unable to do a walk test at this time- states she has "a bad knee" and nerve damage, is uncomfortable doing 16mw on 12/09/15 as scheduled.  The appt for walk test has been cancelled at pt request, but will keep ov as scheduled.  Nothing further, forwarding to [facility] as FYI.

## 2015-11-25 NOTE — Patient Instructions (Signed)
Medication Instructions:  Your physician recommends that you continue on your current medications as directed. Please refer to the Current Medication list given to you today.  Labwork: Digoxin level, bmet, magnesium tomorrow at [facility] lab on the first floor  Testing/Procedures: none  Follow-Up: Your physician wants you to follow-up in: 4 month ov You will receive a reminder letter in the mail two months in advance. If you don't receive a letter, please call our office to schedule the follow-up appointment.  If you need a refill on your cardiac medications before your next appointment, please call your pharmacy.

## 2015-11-25 NOTE — Progress Notes (Signed)
Cardiology Office Note   Date:  11/26/2015   ID:  Destiny Hughes, DOB October 13, 1945, MRN [id number]  PCP:  No PCP Per Patient  Cardiologist:   Skeet Latch, MD   Chief Complaint  Patient presents with  . Follow-up    sob due to COPD. cramping in legs and hands. edema; in feet and knees.     History of Present Illness: Destiny Hughes is a 70 y.o. female with hypertension, mitral valve prolapse, COPD, hyperlipidemia, and hypothyroidism who presents for follow up.   She had a normal adenosine Cardiolite in 2007 and in 2011.  She also had an echo 04/1998 that didn't show any MVP.  Her echo 08/10/09 showed mild aortic sclerosis, normal systolic function and abnormal diastolic function.  Destiny Hughes was previously a patient of Dr. Mare Ferrari.  Lisinopril was added to her regimen due to poorly-controlled hypertension.  She reported some atypical chest pain and was referred for a Lexiscan Myoview 08/2015 that was negative for ischemia.  She also had an echo for shortness of breath which revealed LVEF 60-65% with grade 2 diastolic dysfunction and a mildly dilated right ventricle that was also hypokinetic. Her IVC was noted to be dilated. She was started on Lasix 20 mg oral daily.  Since starting Lasix and lisinopril Destiny Hughes has been feeling better.  She denies chest pain or shortness of breath. Her palpitations have been well-controlled.  She has mild, chronic lower extremity edema that she attributes to her knees.  She continues to smoke 1/2 pack of cigarettes daily and is interested in quitting.  She has been able to quit for short periods in the past.  Destiny Hughes's main complaint is R knee pain.  She previously had 4 knee surgeries but continues to have pain.  Past Medical History:  Diagnosis Date  . Arthritis 01-25-11   back and most joints  . Blood transfusion    after Thoracic surgery ?'04  . Chronic back pain   . Chronic neck pain   . Collapse of right lung   . COPD  (chronic obstructive pulmonary disease) (HCC)    Hx. Bronchitis occ, 01-25-11 somes issue now taking  Z-pak-started 01-24-11/Scar tissue  present  from previous lung collapse  . Depression   . Dyspnea   . Dysrhythmia    hx.PAT- tx. Inderal  . Emphysema of lung ([place])   . GERD (gastroesophageal reflux disease)    tx. TUMS  . Glaucoma   . Headache(784.0) 01-25-11   neck and nerve pain related.  Marland Kitchen Heart murmur   . Hyperlipemia   . Hypothyroidism    tx. Levothyroxine  . Nephrolithiasis   . Neuromuscular disorder ([place]) 01-25-11   Pain/nerve stimulator implanted -2 yrs ago.  . Palpitation   . Tachycardia     Past Surgical History:  Procedure Laterality Date  . APPENDECTOMY  01-25-11  . CARPAL TUNNEL RELEASE  01-25-11   Bil.  . CERVICAL SPINE SURGERY  01-25-11   2005-multiple levels  . CHOLECYSTECTOMY  01-25-11   '07  . COLONOSCOPY WITH PROPOFOL N/A 07/23/2012   Procedure: COLONOSCOPY WITH PROPOFOL;  Surgeon: Garlan Fair, MD;  Location: [facility];  Service: Endoscopy;  Laterality: N/A;  . JOINT REPLACEMENT  01-25-11   6'03 LTHA-hemi  . KNEE ARTHROPLASTY  01-25-11   '04-right, revised x2  . SPINAL CORD STIMULATOR IMPLANT  2009 APPROX   DR. ELSNER  . THORACIC SPINE SURGERY  01-25-11   6'02- then nerve stimulator implanted  after  . TOTAL KNEE REVISION  02/01/2011   Procedure: TOTAL KNEE REVISION;  Surgeon: Mauri Pole;  Location: [facility];  Service: Orthopedics;  Laterality: Right;  . TUBAL LIGATION       Current Outpatient Prescriptions  Medication Sig Dispense Refill  . atenolol (TENORMIN) 100 MG tablet Take 1 tablet by mouth daily.  1  . Biotin (BIOTIN 5000) 5 MG CAPS Take 1 capsule by mouth daily.      . Cholecalciferol (VITAMIN D PO) Take 5,000 Units by mouth daily.    . diazepam (VALIUM) 5 MG tablet Take 5 mg by mouth every 6 (six) hours as needed (for muscle spams).     Marland Kitchen DIGOX 250 MCG tablet TAKE 1 TABLET BY MOUTH EVERY MORNING 90 tablet 1  . furosemide (LASIX) 20 MG  tablet Take 1 tablet (20 mg total) by mouth daily. 30 tablet 5  . levothyroxine (SYNTHROID, LEVOTHROID) 150 MCG tablet TAKE 1 TO 1 AND 1/2 TABLETS BY MOUTH DAILY AS DIRECTED 135 tablet 1  . lisinopril (PRINIVIL,ZESTRIL) 10 MG tablet Take 1 tablet (10 mg total) by mouth daily. 30 tablet 5  . methadone (DOLOPHINE) 5 MG tablet Take 5 mg by mouth every 6 (six) hours.     Marland Kitchen OVER THE COUNTER MEDICATION OTC Flonase nasal spray PRN    . oxyCODONE (ROXICODONE) 15 MG immediate release tablet Take 1 tablet by mouth every 6 (six) hours as needed.  0  . propranolol (INDERAL) 80 MG tablet TAKE 1 TABLET BY MOUTH TWICE DAILY FOR BLOOD PRESSURE 180 tablet 0  . sertraline (ZOLOFT) 100 MG tablet Take 150 mg by mouth every morning.      No current facility-administered medications for this visit.     Allergies:   Ventolin [kdc:albuterol]; Albuterol; Codeine; Penicillins; and Ecotrin [aspirin]    Social History:  The patient  reports that she has been smoking Cigarettes.  She started smoking about 54 years ago. She has a 37.50 pack-year smoking history. She has never used smokeless tobacco. She reports that she does not drink alcohol or use drugs.   Family History:  The patient's family history includes Aneurysm in her mother; Asthma in her son; Breast cancer in her paternal aunt; Diabetes in her son; Emphysema in her father and mother; Heart attack in her father; Hypertension in her son and son; Rheum arthritis in her maternal aunt.    ROS:  Please see the history of present illness.   Otherwise, review of systems are positive for dysphagia, knee pain, back pain.   All other systems are reviewed and negative.    PHYSICAL EXAM: VS:  BP 115/68   Pulse 62   Ht 5\' 6"  (1.676 m)   Wt 157 lb (71.2 kg)   BMI 25.34 kg/m  , BMI Body mass index is 25.34 kg/m. GENERAL:  Well appearing HEENT:  Pupils equal round and reactive, fundi not visualized, oral mucosa unremarkable NECK:  No jugular venous distention,  waveform within normal limits, carotid upstroke brisk and symmetric, no bruits, no thyromegaly LYMPHATICS:  No cervical adenopathy LUNGS:  Clear to auscultation bilaterally HEART:  RRR.  PMI not displaced or sustained,S1 and S2 within normal limits, no S3, no S4, no clicks, no rubs, no murmurs ABD:  Flat, positive bowel sounds normal in frequency in pitch, no bruits, no rebound, no guarding, no midline pulsatile mass, no hepatomegaly, no splenomegaly EXT:  2 plus pulses throughout, no edema, no cyanosis no clubbing SKIN:  No rashes no  nodules NEURO:  Cranial nerves II through XII grossly intact, motor grossly intact throughout PSYCH:  Cognitively intact, oriented to person place and time   EKG:  EKG is not ordered today. The ekg ordered 08/13/15 demonstrates sinus bradycardia. Rate 53 bpm. Left anterior fascicular block. Nonspecific ST changes.  Lexiscan Myoview 09/08/15:  Nuclear stress EF: 62%.  There was no ST segment deviation noted during stress.  The study is normal.  This is a low risk study.  The left ventricular ejection fraction is normal (55-65%).  Echo 09/01/15: Study Conclusions  - Left ventricle: The cavity size was normal. Wall thickness was  normal. Systolic function was normal. The estimated ejection  fraction was in the range of 60% to 65%. Wall motion was normal;  there were no regional wall motion abnormalities. Doppler  parameters are consistent with pseudonormal left ventricular  relaxation (grade 2 diastolic dysfunction). The E/e&' ratio is  >15, suggesting elevated LV filling pressure. - Mitral valve: Mildly thickened leaflets . There was mild  regurgitation. - Left atrium: The atrium was normal in size. - Right ventricle: The cavity size was mildly dilated. Systolic  function is reduced. - Right atrium: The atrium was mildly dilated. - Inferior vena cava: The vessel was dilated. The respirophasic  diameter changes were blunted (< 50%),  consistent with elevated  central venous pressure.  Impressions:  - LVEF 60-65%, normal wall thickness, normal wall motion, stage 2  DD with elevated LV filling pressure, mild MR, normal LA size,  mild RAE, dilated RV with reduced systolic function, dilated IVC,  no TR.   Recent Labs: 04/28/2015: ALT 15; Magnesium 1.8; TSH 1.04 09/01/2015: BUN 18; Creat 1.04; Potassium 4.8; Sodium 136 10/13/2015: Hemoglobin 16.4; Platelets 253    Lipid Panel    Component Value Date/Time   CHOL 195 04/28/2015 1525   TRIG 188 (H) 04/28/2015 1525   HDL 43 (L) 04/28/2015 1525   CHOLHDL 4.5 04/28/2015 1525   VLDL 38 (H) 04/28/2015 1525   LDLCALC 114 04/28/2015 1525      Wt Readings from Last 3 Encounters:  11/25/15 157 lb (71.2 kg)  10/13/15 154 lb (69.9 kg)  10/07/15 156 lb 12.8 oz (71.1 kg)      ASSESSMENT AND PLAN:  # Palpitations: Symptoms are well-controlled on propranolol and digoxin. We will not make any changes at this time. She is due to have her digoxin level checked, but she is unsure of whether she took her dose this am.  She will return on a day when she has taken the medication.  We will also check a BMP and magnesium at that time.  Dig level, mag, BMP  # Hypertension: Blood pressure is well-controlled. Continue lisinopril and propranolol.  # Tobacco abuse: Ms. Renslow would like to try to quit smoking.  I recommended that she try nicotine patches starting at 14 mg/day.  We discussed smoking cessation for 5 minutes.   # Chest pain:  # Shortness of breath: Symptoms have resolved. Stress testing was negative for ischemia and her echo showed grade 2 diastolic dysfunction. Her breathing has improved with the addition of lasix.   # Chronic diastolic heart failure: She is currently euvolemic.  Continue lasix, metoprolol and propranolol.   Current medicines are reviewed at length with the patient today.  The patient does not have concerns regarding medicines.  The  following changes have been made:  No changes.  Labs/ tests ordered today include:   Orders Placed This Encounter  Procedures  .  Basic metabolic panel  . Magnesium  . Digoxin level    Disposition:   FU with Martha Ellerby C. Oval Linsey, MD, [facility] in 4 months.    This note was written with the assistance of speech recognition software.  Please excuse any transcriptional errors.  Signed, Catarina Huntley C. Oval Linsey, MD, [facility]  11/26/2015 2:40 PM    [facility]

## 2015-11-27 ENCOUNTER — Ambulatory Visit: Payer: Medicare Other

## 2015-11-27 ENCOUNTER — Ambulatory Visit: Payer: Medicare Other | Admitting: Pulmonary Disease

## 2015-12-02 ENCOUNTER — Ambulatory Visit: Payer: Medicare Other | Admitting: Pulmonary Disease

## 2015-12-02 ENCOUNTER — Ambulatory Visit: Payer: Medicare Other

## 2015-12-09 ENCOUNTER — Ambulatory Visit (INDEPENDENT_AMBULATORY_CARE_PROVIDER_SITE_OTHER): Payer: Medicare Other | Admitting: Pulmonary Disease

## 2015-12-09 ENCOUNTER — Encounter: Payer: Self-pay | Admitting: Pulmonary Disease

## 2015-12-09 ENCOUNTER — Ambulatory Visit: Payer: Medicare Other

## 2015-12-09 VITALS — BP 116/66 | HR 60 | Ht 66.0 in | Wt 155.2 lb

## 2015-12-09 DIAGNOSIS — J449 Chronic obstructive pulmonary disease, unspecified: Secondary | ICD-10-CM

## 2015-12-09 DIAGNOSIS — F17200 Nicotine dependence, unspecified, uncomplicated: Secondary | ICD-10-CM

## 2015-12-09 DIAGNOSIS — K219 Gastro-esophageal reflux disease without esophagitis: Secondary | ICD-10-CM

## 2015-12-09 MED ORDER — FLUTICASONE FUROATE-VILANTEROL 100-25 MCG/INH IN AEPB: 1.0000 | INHALATION_SPRAY | Freq: Every day | RESPIRATORY_TRACT | 3 refills | Status: DC

## 2015-12-09 NOTE — Progress Notes (Signed)
Subjective:    Patient ID: Destiny Hughes, female    DOB: Sep 06, 1945, 70 y.o.   MRN: [id number]  C.C.:  Follow-up for Very Severe COPD w/ Emphysema, Acute Hypoxic Respiratory Failure, GERD, & Tobacco Use Disorder.  HPI  Very Severe COPD w/ Emphysema: Previously was taking Theophylline. Previously unable to afford Spiriva or [place]. Tried on Spiriva Respimat with reported worsening in her cough. Previously declined Prednisone therapy. She reports her breathing is getting worse. She is coughing frequently and is producing a yellow mucus. Tolerated Breo without adverse effect in the past.   Acute Hypoxic Respiratory Failure:  Patient was saturating only 87% on room air on arrival at last appointment & required 2 L/m to maintain her saturation. Patient had no oxygen requirement with a brief walk in the hallway after that result.   GERD: Previously taking Pepcid intermittently. Recommended contacting Dr. Wynetta Emery at last appointment to address her reflux. She ended up discussing it with Dr. Oval Linsey. She is still having reflux but reports it's not coming in to her throat. She reports Zantac doesn't seem to help. She does reports Prevacid does seem to help.  Tobacco Use Disorder:  She reports she has tried very had to stop. She is now smoking less than 1/2 ppd.   Review of Systems  She reports she has chronic chills. Denies any fever or sweats. She denies any new chest pain, pressure, or tightness.   Allergies  Allergen Reactions  . Ventolin [Kdc:Albuterol] Anaphylaxis  . Albuterol     Had difficulty breathing after a nebulizer treatment  . Codeine Nausea And Vomiting  . Penicillins Swelling  . Ecotrin [Aspirin] Nausea And Vomiting   Current Outpatient Prescriptions on File Prior to Visit  Medication Sig Dispense Refill  . Biotin (BIOTIN 5000) 5 MG CAPS Take 1 capsule by mouth daily.      . Cholecalciferol (VITAMIN D PO) Take 5,000 Units by mouth daily.    . diazepam (VALIUM) 5 MG  tablet Take 5 mg by mouth every 6 (six) hours as needed (for muscle spams).     Marland Kitchen DIGOX 250 MCG tablet TAKE 1 TABLET BY MOUTH EVERY MORNING 90 tablet 1  . furosemide (LASIX) 20 MG tablet Take 1 tablet (20 mg total) by mouth daily. 30 tablet 5  . levothyroxine (SYNTHROID, LEVOTHROID) 150 MCG tablet TAKE 1 TO 1 AND 1/2 TABLETS BY MOUTH DAILY AS DIRECTED 135 tablet 1  . lisinopril (PRINIVIL,ZESTRIL) 10 MG tablet Take 1 tablet (10 mg total) by mouth daily. 30 tablet 5  . methadone (DOLOPHINE) 5 MG tablet Take 5 mg by mouth every 6 (six) hours.     Marland Kitchen oxyCODONE (ROXICODONE) 15 MG immediate release tablet Take 1 tablet by mouth every 6 (six) hours as needed.  0  . propranolol (INDERAL) 80 MG tablet TAKE 1 TABLET BY MOUTH TWICE DAILY FOR BLOOD PRESSURE 180 tablet 0  . sertraline (ZOLOFT) 100 MG tablet Take 150 mg by mouth every morning.     . [DISCONTINUED] ferrous sulfate 325 (65 FE) MG tablet Take 1 tablet (325 mg total) by mouth 3 (three) times daily after meals.    . [DISCONTINUED] rivaroxaban (XARELTO) 10 MG TABS tablet Take 1 tablet (10 mg total) by mouth daily. 12 tablet    No current facility-administered medications on file prior to visit.    Past Medical History:  Diagnosis Date  . Arthritis 01-25-11   back and most joints  . Blood transfusion    after Thoracic  surgery ?'04  . Chronic back pain   . Chronic neck pain   . Collapse of right lung   . COPD (chronic obstructive pulmonary disease) (HCC)    Hx. Bronchitis occ, 01-25-11 somes issue now taking  Z-pak-started 01-24-11/Scar tissue  present  from previous lung collapse  . Depression   . Dyspnea   . Dysrhythmia    hx.PAT- tx. Inderal  . Emphysema of lung ([place])   . GERD (gastroesophageal reflux disease)    tx. TUMS  . Glaucoma   . Headache(784.0) 01-25-11   neck and nerve pain related.  Marland Kitchen Heart murmur   . Hyperlipemia   . Hypothyroidism    tx. Levothyroxine  . Nephrolithiasis   . Neuromuscular disorder ([place]) 01-25-11    Pain/nerve stimulator implanted -2 yrs ago.  . Palpitation   . Tachycardia    Past Surgical History:  Procedure Laterality Date  . APPENDECTOMY  01-25-11  . CARPAL TUNNEL RELEASE  01-25-11   Bil.  . CERVICAL SPINE SURGERY  01-25-11   2005-multiple levels  . CHOLECYSTECTOMY  01-25-11   '07  . COLONOSCOPY WITH PROPOFOL N/A 07/23/2012   Procedure: COLONOSCOPY WITH PROPOFOL;  Surgeon: Garlan Fair, MD;  [facility];  Service: Endoscopy;  Laterality: N/A;  . JOINT REPLACEMENT  01-25-11   6'03 LTHA-hemi  . KNEE ARTHROPLASTY  01-25-11   '04-right, revised x2  . SPINAL CORD STIMULATOR IMPLANT  2009 APPROX   DR. ELSNER  . THORACIC SPINE SURGERY  01-25-11   6'02- then nerve stimulator implanted after  . TOTAL KNEE REVISION  02/01/2011   Procedure: TOTAL KNEE REVISION;  Surgeon: Mauri Pole;  Location: [facility];  Service: Orthopedics;  Laterality: Right;  . TUBAL LIGATION     Family History  Problem Relation Age of Onset  . Heart attack Father   . Emphysema Father   . Aneurysm Mother     CEREBRAL  . Emphysema Mother   . Diabetes Son   . Hypertension Son   . Hypertension Son   . Asthma Son     x2  . Breast cancer Paternal Aunt   . Rheum arthritis Maternal Aunt    Social History   Social History  . Marital status: Married    Spouse name: N/A  . Number of children: 2  . Years of education: N/A   Occupational History  . disabled    Social History Main Topics  . Smoking status: Current Every Day Smoker    Packs/day: 0.75    Years: 50.00    Types: Cigarettes    Start date: 09/13/1961  . Smokeless tobacco: Never Used     Comment: Peak rate of 1ppd  . Alcohol use No     Comment: none in 10 years  . Drug use: No  . Sexual activity: No   Other Topics Concern  . None   Social History Narrative   She is from [facility]. She has always lived in [place]. She has traveled to [place], [place], [place], [place], & [place]. Worked as a Merchandiser, retail. Worked in a Estate agent. Worked in a  [employer] in a very dusty doffing room. She worked in Pensions consultant. Prior exposure to parakeets with last exposure remote in her current home. Previously had mold in her home that was in her bathroom & fixed. Prior exposure to hot tubs but none recently.       Objective:   Physical Exam BP 116/66 (BP Location: Left Arm,  Cuff Size: Normal)   Pulse 60   Ht 5\' 6"  (1.676 m)   Wt 155 lb 3.2 oz (70.4 kg)   SpO2 95%   BMI 25.05 kg/m  General:  Awake. No distress. Comfortable. Integument:  Warm & dry. No rash on exposed skin.  HEENT:  Moist mucus membranes. No oral ulcers. No scleral icterus with minimal injection. Mild bilateral nasal turbinate swelling. Cardiovascular:  Regular rate & rhythm. Trace edema. Pulmonary:  Again a very mild end expiratory wheeze apically. Good aeration bilaterally. Normal work of breathing on room air. Musculoskeletal: Normal bulk and tone. No joint deformity appreciated.  PFT 08/27/15: FVC 1.09 L (34%) FEV1 0.75 L (31%) FEV1/FVC 0.69 FEF 25-75 0.49 L (25%)                                                               DLCO uncorrected 42% 12/26/14: FVC 1.23 L (38%) FEV1 0.78 L (32%) FEV1/FVC 0.64 FEF 25-75 0.43 L (21%) TLC 4.96 L (92%) RV 128% ERV 27% DLCO uncorrected 51%  IMAGING HRCT CHEST W/O 11/18/14 (previously reviewed by me): No bronchiectasis or intralobular septal thickening. Diffuse bronchial wall thickening as well as mild centrilobular and paraseptal emphysema upper lobe predominant. Mild tracheobronchomalacia with air trapping. Atherosclerosis noted by radiologist. No mediastinal mass or pathologic adenopathy on my review. No pericardial effusion. No pleural effusion or thickening.  Barium Swallow 11/17/14 (per radiologist): Mild esophageal dysmotility with mild GERD. No stricture, mass, or esophageal mucosal irregularity.  CXR PA/LAT 10/21/14 (previously reviewed by me): Upper thoracic spinal cord stimulator noted. Kyphosis noted.  Hyperinflation with flattening of the diaphragms. No pleural effusion or thickening appreciated. No parenchymal opacity or nodule appreciated. Heart normal in size. Mediastinum normal in contour.  CARDIAC NUCLEAR STRESS TEST (09/08/15):  Rest and stress perfusion normal. LV EF 55-65%.   TTE (09/01/15):  LV normal in size. EF 60-65%. Grade 2 diastolic dysfunction. LA normal in size. RA mildly dilated. RV mildly dilated with mild reduction in systolic function. No AS or AR. Mild MR. No TR. No pericardial effusion.   MICROBIOLOGY Sputum (01/06/15): Oral Flora / Candida albicans / AFB negative  LABS 10/13/15 CBC:  10.3/16.4/48.5/253  12/26/14 IgG: 1090 IgA: 345 IgM: 42 IgE: 35 RAST Panel: Goose Feathers 0.11 (class 0/1) Alpha-1 AT: MM (183)  05/21/14 CBC: 7.1/15.2/45.6/270 BMP: 135/4.5/98/33/18/0.91/134/9.3 LFT: 3.9/6.6/0.6/67/13/11 Magnesium: 1.9    Assessment & Plan:  70 y.o. 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 She has known underlying very severe COPD with emphysema and is intolerant as well as unable to afford multiple inhaler medications. Patient is continuing to try to quit smoking and reports that tobacco use does seem to precipitate her cough. She has been intolerant of Breo in the past and is willing to try it again. I instructed the patient to notify my office if she had any new breathing problems before her next appointment as it would be happy to see her sooner.  1. Very severe COPD w/ Emphysema: Trying the patient on Breo 100 daily again. Patient given samples. 2. GERD w/ Dysphagia:  Recommended to take Prevacid daily. She is going to discuss a referral to [facility] with Dr. Claria Dice.  3. Ongoing Tobacco  Use: Counseled for over 3 minutes on the need to completely quit using tobacco.  4. Health maintenance: Declined immunization previously.  5. Follow-up: Return to clinic in 3 months or sooner if  needed.  Sonia Baller Ashok Cordia, M.D. [facility]monary [facility] Pager:  [phone] After 3pm or if no response, call [phone] 3:49 PM 12/09/15

## 2015-12-09 NOTE — Addendum Note (Signed)
Addended by: Inge Rise on: 12/09/2015 04:22 PM   Modules accepted: Orders

## 2015-12-09 NOTE — Patient Instructions (Signed)
   You can take Guaifenesin in place of Mucinex (this is its active ingredient) twice daily with plenty of liquids to help with your mucus.  Use your Breo inhaler once daily.  Try taking your Prevacid daily to help control your reflux.  I will see you back in 3 months or sooner if needed.

## 2015-12-11 ENCOUNTER — Telehealth: Payer: Self-pay | Admitting: *Deleted

## 2015-12-11 NOTE — Telephone Encounter (Signed)
Patient was instructed to go for labs at last [facility] with patient, stated she forgot and will go Monday 12/14/15

## 2015-12-16 ENCOUNTER — Telehealth: Payer: Self-pay | Admitting: Cardiovascular Disease

## 2015-12-16 NOTE — Telephone Encounter (Signed)
New message    Pt called stating that she was told by Dr. Oval Linsey to go downstairs to have her blood drawn but upon arrival there she was told at the lab that they do not draw blood there for Dr. Oval Linsey anymore. The pt wants to know where she should go. Please call.

## 2015-12-16 NOTE — Telephone Encounter (Signed)
Verified orders are at [facility], when I called patient and discussed she realized she went to wrong lab site by mistake. Instructions given to go to [facility] for labs and call if problem. Understanding and thanks verbalized.

## 2015-12-16 NOTE — Telephone Encounter (Signed)
Called patient and she verbalized she was not able to get labs for reasons stated in initial call. Informed her I would reach out to [facility] to find out what the issue was and call her back once I could assist further - we could consider a different lab site.  Called [facility] and left msg for technician to return my call.

## 2015-12-17 DIAGNOSIS — Z79899 Other long term (current) drug therapy: Secondary | ICD-10-CM | POA: Diagnosis not present

## 2015-12-18 LAB — BASIC METABOLIC PANEL
BUN: 13 mg/dL (ref 7–25)
CALCIUM: 9.2 mg/dL (ref 8.6–10.4)
CHLORIDE: 93 mmol/L — AB (ref 98–110)
CO2: 27 mmol/L (ref 20–31)
CREATININE: 0.87 mg/dL (ref 0.60–0.93)
GLUCOSE: 88 mg/dL (ref 65–99)
Potassium: 4.3 mmol/L (ref 3.5–5.3)
Sodium: 133 mmol/L — ABNORMAL LOW (ref 135–146)

## 2015-12-18 LAB — DIGOXIN LEVEL: Digoxin Level: 1.9 ug/L (ref 0.8–2.0)

## 2015-12-18 LAB — MAGNESIUM: Magnesium: 1.7 mg/dL (ref 1.5–2.5)

## 2015-12-25 ENCOUNTER — Telehealth: Payer: Self-pay | Admitting: Pulmonary Disease

## 2015-12-25 NOTE — Telephone Encounter (Signed)
Pt stated that her breo was received from the pharmacy and she stated that her breo had only 30 inhalations but she has been getting the breo with 60 puffs.  She stated that the pharmacy told her that this was normal.  Pt was worried that she was paying for the 60 doses, and I advised her that she was just paying for the medication.

## 2015-12-29 DIAGNOSIS — G894 Chronic pain syndrome: Secondary | ICD-10-CM | POA: Diagnosis not present

## 2015-12-29 DIAGNOSIS — M5481 Occipital neuralgia: Secondary | ICD-10-CM | POA: Diagnosis not present

## 2015-12-29 DIAGNOSIS — M791 Myalgia: Secondary | ICD-10-CM | POA: Diagnosis not present

## 2015-12-29 DIAGNOSIS — Z79891 Long term (current) use of opiate analgesic: Secondary | ICD-10-CM | POA: Diagnosis not present

## 2016-01-11 ENCOUNTER — Other Ambulatory Visit: Payer: Self-pay | Admitting: Cardiovascular Disease

## 2016-01-12 DIAGNOSIS — M47812 Spondylosis without myelopathy or radiculopathy, cervical region: Secondary | ICD-10-CM | POA: Diagnosis not present

## 2016-01-12 DIAGNOSIS — M791 Myalgia: Secondary | ICD-10-CM | POA: Diagnosis not present

## 2016-01-12 NOTE — Telephone Encounter (Signed)
Review for refill. 

## 2016-01-20 ENCOUNTER — Other Ambulatory Visit: Payer: Self-pay | Admitting: *Deleted

## 2016-01-22 ENCOUNTER — Telehealth: Payer: Self-pay | Admitting: Cardiovascular Disease

## 2016-01-22 MED ORDER — DIGOXIN 250 MCG PO TABS: 250.0000 ug | ORAL_TABLET | Freq: Every morning | ORAL | 1 refills | Status: DC

## 2016-01-22 MED ORDER — PROPRANOLOL HCL 80 MG PO TABS: 80.0000 mg | ORAL_TABLET | Freq: Two times a day (BID) | ORAL | 1 refills | Status: DC

## 2016-01-22 NOTE — Telephone Encounter (Signed)
Spoke w patient and confirmed Rxs needed for refill. These were pending in an incomplete refill encounter, and I overrode to refill after confirming doses with patient.  Confirmed e-receipt w pharmacy. Pt voiced thanks for help.

## 2016-01-22 NOTE — Telephone Encounter (Signed)
Pt is still waiting on her medicine. She needs it for the weekend please.

## 2016-01-27 DIAGNOSIS — M791 Myalgia: Secondary | ICD-10-CM | POA: Diagnosis not present

## 2016-01-27 DIAGNOSIS — M47812 Spondylosis without myelopathy or radiculopathy, cervical region: Secondary | ICD-10-CM | POA: Diagnosis not present

## 2016-02-08 ENCOUNTER — Ambulatory Visit: Payer: Medicare Other | Admitting: Cardiovascular Disease

## 2016-02-10 DIAGNOSIS — M791 Myalgia: Secondary | ICD-10-CM | POA: Diagnosis not present

## 2016-02-10 DIAGNOSIS — M47812 Spondylosis without myelopathy or radiculopathy, cervical region: Secondary | ICD-10-CM | POA: Diagnosis not present

## 2016-02-22 ENCOUNTER — Other Ambulatory Visit: Payer: Self-pay

## 2016-02-22 ENCOUNTER — Other Ambulatory Visit: Payer: Self-pay | Admitting: Cardiovascular Disease

## 2016-02-22 MED ORDER — FUROSEMIDE 20 MG PO TABS: 20.0000 mg | ORAL_TABLET | Freq: Every day | ORAL | 5 refills | Status: DC

## 2016-02-22 NOTE — Telephone Encounter (Signed)
Please review for refill. Thanks!  

## 2016-02-25 DIAGNOSIS — M791 Myalgia: Secondary | ICD-10-CM | POA: Diagnosis not present

## 2016-02-25 DIAGNOSIS — M47812 Spondylosis without myelopathy or radiculopathy, cervical region: Secondary | ICD-10-CM | POA: Diagnosis not present

## 2016-03-07 DIAGNOSIS — H01022 Squamous blepharitis right lower eyelid: Secondary | ICD-10-CM | POA: Diagnosis not present

## 2016-03-07 DIAGNOSIS — H01025 Squamous blepharitis left lower eyelid: Secondary | ICD-10-CM | POA: Diagnosis not present

## 2016-03-07 DIAGNOSIS — H25813 Combined forms of age-related cataract, bilateral: Secondary | ICD-10-CM | POA: Diagnosis not present

## 2016-03-07 DIAGNOSIS — H40013 Open angle with borderline findings, low risk, bilateral: Secondary | ICD-10-CM | POA: Diagnosis not present

## 2016-03-07 DIAGNOSIS — H04123 Dry eye syndrome of bilateral lacrimal glands: Secondary | ICD-10-CM | POA: Diagnosis not present

## 2016-03-07 DIAGNOSIS — H01021 Squamous blepharitis right upper eyelid: Secondary | ICD-10-CM | POA: Diagnosis not present

## 2016-03-07 DIAGNOSIS — H01024 Squamous blepharitis left upper eyelid: Secondary | ICD-10-CM | POA: Diagnosis not present

## 2016-03-11 ENCOUNTER — Ambulatory Visit: Payer: Medicare Other | Admitting: Pulmonary Disease

## 2016-03-21 DIAGNOSIS — I499 Cardiac arrhythmia, unspecified: Secondary | ICD-10-CM

## 2016-03-21 HISTORY — DX: Cardiac arrhythmia, unspecified: I49.9

## 2016-03-22 ENCOUNTER — Other Ambulatory Visit: Payer: Self-pay | Admitting: Cardiovascular Disease

## 2016-03-22 NOTE — Telephone Encounter (Signed)
Review for refill. 

## 2016-03-24 ENCOUNTER — Ambulatory Visit: Payer: Medicare Other | Admitting: Pulmonary Disease

## 2016-04-04 DIAGNOSIS — I10 Essential (primary) hypertension: Secondary | ICD-10-CM | POA: Diagnosis not present

## 2016-04-04 DIAGNOSIS — E039 Hypothyroidism, unspecified: Secondary | ICD-10-CM | POA: Diagnosis not present

## 2016-04-04 DIAGNOSIS — M79641 Pain in right hand: Secondary | ICD-10-CM | POA: Diagnosis not present

## 2016-04-04 DIAGNOSIS — M542 Cervicalgia: Secondary | ICD-10-CM | POA: Diagnosis not present

## 2016-04-04 DIAGNOSIS — R7309 Other abnormal glucose: Secondary | ICD-10-CM | POA: Diagnosis not present

## 2016-04-04 DIAGNOSIS — E559 Vitamin D deficiency, unspecified: Secondary | ICD-10-CM | POA: Diagnosis not present

## 2016-04-11 DIAGNOSIS — G894 Chronic pain syndrome: Secondary | ICD-10-CM | POA: Diagnosis not present

## 2016-04-11 DIAGNOSIS — Z79891 Long term (current) use of opiate analgesic: Secondary | ICD-10-CM | POA: Diagnosis not present

## 2016-04-11 DIAGNOSIS — M5481 Occipital neuralgia: Secondary | ICD-10-CM | POA: Diagnosis not present

## 2016-04-11 DIAGNOSIS — M791 Myalgia: Secondary | ICD-10-CM | POA: Diagnosis not present

## 2016-04-18 DIAGNOSIS — H01022 Squamous blepharitis right lower eyelid: Secondary | ICD-10-CM | POA: Diagnosis not present

## 2016-04-18 DIAGNOSIS — H40013 Open angle with borderline findings, low risk, bilateral: Secondary | ICD-10-CM | POA: Diagnosis not present

## 2016-04-18 DIAGNOSIS — H01021 Squamous blepharitis right upper eyelid: Secondary | ICD-10-CM | POA: Diagnosis not present

## 2016-04-18 DIAGNOSIS — H01024 Squamous blepharitis left upper eyelid: Secondary | ICD-10-CM | POA: Diagnosis not present

## 2016-04-18 DIAGNOSIS — H25813 Combined forms of age-related cataract, bilateral: Secondary | ICD-10-CM | POA: Diagnosis not present

## 2016-04-18 DIAGNOSIS — H01025 Squamous blepharitis left lower eyelid: Secondary | ICD-10-CM | POA: Diagnosis not present

## 2016-04-18 DIAGNOSIS — H04123 Dry eye syndrome of bilateral lacrimal glands: Secondary | ICD-10-CM | POA: Diagnosis not present

## 2016-04-20 ENCOUNTER — Encounter: Payer: Self-pay | Admitting: Cardiovascular Disease

## 2016-04-20 ENCOUNTER — Ambulatory Visit (INDEPENDENT_AMBULATORY_CARE_PROVIDER_SITE_OTHER): Payer: Medicare Other | Admitting: Cardiovascular Disease

## 2016-04-20 VITALS — BP 132/63 | HR 54 | Ht 64.0 in | Wt 154.8 lb

## 2016-04-20 DIAGNOSIS — Z720 Tobacco use: Secondary | ICD-10-CM

## 2016-04-20 DIAGNOSIS — I10 Essential (primary) hypertension: Secondary | ICD-10-CM

## 2016-04-20 DIAGNOSIS — R0602 Shortness of breath: Secondary | ICD-10-CM

## 2016-04-20 DIAGNOSIS — I6529 Occlusion and stenosis of unspecified carotid artery: Secondary | ICD-10-CM | POA: Diagnosis not present

## 2016-04-20 DIAGNOSIS — R0789 Other chest pain: Secondary | ICD-10-CM

## 2016-04-20 DIAGNOSIS — R002 Palpitations: Secondary | ICD-10-CM | POA: Diagnosis not present

## 2016-04-20 DIAGNOSIS — E7800 Pure hypercholesterolemia, unspecified: Secondary | ICD-10-CM | POA: Diagnosis not present

## 2016-04-20 NOTE — Patient Instructions (Signed)
Medication Instructions:  Your physician recommends that you continue on your current medications as directed. Please refer to the Current Medication list given to you today.  Labwork: none  Testing/Procedures: Your physician has requested that you have a carotid duplex. This test is an ultrasound of the carotid arteries in your neck. It looks at blood flow through these arteries that supply the brain with blood. Allow one hour for this exam. There are no restrictions or special instructions.  Follow-Up: Your physician recommends that you schedule a follow-up appointment in: 1 month ov  Any Other Special Instructions Will Be Listed Below (If Applicable). WILL OBTAIN LABS FROM PCP    If you need a refill on your cardiac medications before your next appointment, please call your pharmacy.

## 2016-04-20 NOTE — Progress Notes (Signed)
Cardiology Office Note   Date:  04/20/2016   ID:  Destiny Hughes, DOB Dec 03, 1945, MRN [id number]  PCP:  Minette Brine  Cardiologist:   Skeet Latch, MD   Chief Complaint  Patient presents with  . Follow-up     4 months;     History of Present Illness: Destiny Hughes is a 71 y.o. female with hypertension, mitral valve prolapse, COPD, hyperlipidemia, and hypothyroidism who presents for follow up.   She had a normal adenosine Cardiolite in 2007 and in 2011.  She also had an echo 04/1998 that didn't show any MVP.  Her echo 08/10/09 showed mild aortic sclerosis, normal systolic function and abnormal diastolic function.  Destiny Hughes was previously a patient of Dr. Mare Ferrari.  Lisinopril was added to her regimen due to poorly-controlled hypertension.  She reported some atypical chest pain and was referred for a Lexiscan Myoview 08/2015 that was negative for ischemia.  She also had an echo for shortness of breath which revealed LVEF 60-65% with grade 2 diastolic dysfunction and a mildly dilated right ventricle that was also hypokinetic. Her IVC was noted to be dilated. She was started on Lasix 20 mg oral daily and her breathing has improved.  She complains of chronic right upper quadrant pain and right knee pain. She has been otherwise well in her lower extremity edema has improved. She continues to smoke. She reports a couple episodes of palpitations lasting for less than 1 minute.  Destiny Hughes has been struggling due to the death of her sister last month. Since then her smoking has increased.   Past Medical History:  Diagnosis Date  . Arthritis 01-25-11   back and most joints  . Blood transfusion    after Thoracic surgery ?'04  . Chronic back pain   . Chronic neck pain   . Collapse of right lung   . COPD (chronic obstructive pulmonary disease) (HCC)    Hx. Bronchitis occ, 01-25-11 somes issue now taking  Z-pak-started 01-24-11/Scar tissue  present  from previous lung  collapse  . Depression   . Dyspnea   . Dysrhythmia    hx.PAT- tx. Inderal  . Emphysema of lung ([place])   . GERD (gastroesophageal reflux disease)    tx. TUMS  . Glaucoma   . Headache(784.0) 01-25-11   neck and nerve pain related.  Marland Kitchen Heart murmur   . Hyperlipemia   . Hypothyroidism    tx. Levothyroxine  . Nephrolithiasis   . Neuromuscular disorder ([place]) 01-25-11   Pain/nerve stimulator implanted -2 yrs ago.  . Palpitation   . Tachycardia     Past Surgical History:  Procedure Laterality Date  . APPENDECTOMY  01-25-11  . CARPAL TUNNEL RELEASE  01-25-11   Bil.  . CERVICAL SPINE SURGERY  01-25-11   2005-multiple levels  . CHOLECYSTECTOMY  01-25-11   '07  . COLONOSCOPY WITH PROPOFOL N/A 07/23/2012   Procedure: COLONOSCOPY WITH PROPOFOL;  Surgeon: Garlan Fair, MD;  Location: [facility];  Service: Endoscopy;  Laterality: N/A;  . JOINT REPLACEMENT  01-25-11   6'03 LTHA-hemi  . KNEE ARTHROPLASTY  01-25-11   '04-right, revised x2  . SPINAL CORD STIMULATOR IMPLANT  2009 APPROX   DR. ELSNER  . THORACIC SPINE SURGERY  01-25-11   6'02- then nerve stimulator implanted after  . TOTAL KNEE REVISION  02/01/2011   Procedure: TOTAL KNEE REVISION;  Surgeon: Mauri Pole;  Location: [facility];  Service: Orthopedics;  Laterality: Right;  . TUBAL LIGATION  Current Outpatient Prescriptions  Medication Sig Dispense Refill  . Biotin (BIOTIN 5000) 5 MG CAPS Take 1 capsule by mouth daily.      . Cholecalciferol (VITAMIN D PO) Take 5,000 Units by mouth daily.    . diazepam (VALIUM) 5 MG tablet Take 5 mg by mouth every 6 (six) hours as needed (for muscle spams).     . digoxin (DIGOX) 0.25 MG tablet Take 1 tablet (250 mcg total) by mouth every morning. 90 tablet 1  . furosemide (LASIX) 20 MG tablet Take 1 tablet (20 mg total) by mouth daily. 30 tablet 5  . levothyroxine (SYNTHROID, LEVOTHROID) 150 MCG tablet TAKE 1 TO 1 AND 1/2 TABLETS BY MOUTH DAILY AS DIRECTED 135 tablet 1  . lisinopril  (PRINIVIL,ZESTRIL) 10 MG tablet TAKE 1 TABLET(10 MG) BY MOUTH DAILY 30 tablet 8  . methadone (DOLOPHINE) 5 MG tablet Take 5 mg by mouth every 6 (six) hours.     Marland Kitchen oxyCODONE (ROXICODONE) 15 MG immediate release tablet Take 1 tablet by mouth every 6 (six) hours as needed.  0  . propranolol (INDERAL) 80 MG tablet Take 1 tablet (80 mg total) by mouth 2 (two) times daily. 180 tablet 1  . sertraline (ZOLOFT) 100 MG tablet Take 150 mg by mouth every morning.      No current facility-administered medications for this visit.     Allergies:   Ventolin [kdc:albuterol]; Albuterol; Codeine; Penicillins; and Ecotrin [aspirin]    Social History:  The patient  reports that she has been smoking Cigarettes.  She started smoking about 54 years ago. She has a 37.50 pack-year smoking history. She has never used smokeless tobacco. She reports that she does not drink alcohol or use drugs.   Family History:  The patient's family history includes Aneurysm in her mother; Asthma in her son; Breast cancer in her paternal aunt; Diabetes in her son; Emphysema in her father and mother; Heart attack in her father; Hypertension in her son and son; Rheum arthritis in her maternal aunt.    ROS:  Please see the history of present illness.   Otherwise, review of systems are positive for dysphagia, knee pain, back pain.   All other systems are reviewed and negative.    PHYSICAL EXAM: VS:  BP 132/63   Pulse (!) 54   Ht 5\' 4"  (1.626 m)   Wt 70.2 kg (154 lb 12.8 oz)   BMI 26.57 kg/m  , BMI Body mass index is 26.57 kg/m. GENERAL:  Well appearing HEENT:  Pupils equal round and reactive, fundi not visualized, oral mucosa unremarkable NECK:  No jugular venous distention, waveform within normal limits, carotid upstroke brisk and symmetric, no bruits, no thyromegaly LYMPHATICS:  No cervical adenopathy LUNGS:  Clear to auscultation bilaterally HEART:  RRR.  PMI not displaced or sustained,S1 and S2 within normal limits, no S3, no  S4, no clicks, no rubs, no murmurs ABD:  Flat, positive bowel sounds normal in frequency in pitch, no bruits, no rebound, no guarding, no midline pulsatile mass, no hepatomegaly, no splenomegaly EXT:  2 plus pulses throughout, no edema, no cyanosis no clubbing SKIN:  No rashes no nodules NEURO:  Cranial nerves II through XII grossly intact, motor grossly intact throughout PSYCH:  Cognitively intact, oriented to person place and time   EKG:  EKG is not ordered today. The ekg ordered 08/13/15 demonstrates sinus bradycardia. Rate 53 bpm. Left anterior fascicular block. Nonspecific ST changes. 04/20/16: Sinus bradycardia.  Rate 54 bpm.  First degree  AV block.  Non-specific ST-T changes  Lexiscan Myoview 09/08/15:  Nuclear stress EF: 62%.  There was no ST segment deviation noted during stress.  The study is normal.  This is a low risk study.  The left ventricular ejection fraction is normal (55-65%).  Echo 09/01/15: Study Conclusions  - Left ventricle: The cavity size was normal. Wall thickness was  normal. Systolic function was normal. The estimated ejection  fraction was in the range of 60% to 65%. Wall motion was normal;  there were no regional wall motion abnormalities. Doppler  parameters are consistent with pseudonormal left ventricular  relaxation (grade 2 diastolic dysfunction). The E/e&' ratio is  >15, suggesting elevated LV filling pressure. - Mitral valve: Mildly thickened leaflets . There was mild  regurgitation. - Left atrium: The atrium was normal in size. - Right ventricle: The cavity size was mildly dilated. Systolic  function is reduced. - Right atrium: The atrium was mildly dilated. - Inferior vena cava: The vessel was dilated. The respirophasic  diameter changes were blunted (< 50%), consistent with elevated  central venous pressure.  Impressions:  - LVEF 60-65%, normal wall thickness, normal wall motion, stage 2  DD with elevated LV filling  pressure, mild MR, normal LA size,  mild RAE, dilated RV with reduced systolic function, dilated IVC,  no TR.   Recent Labs: 04/28/2015: ALT 15; TSH 1.04 10/13/2015: Hemoglobin 16.4; Platelets 253 12/17/2015: BUN 13; Creat 0.87; Magnesium 1.7; Potassium 4.3; Sodium 133   12/17/15: Digoxin 1.9  Lipid Panel    Component Value Date/Time   CHOL 195 04/28/2015 1525   TRIG 188 (H) 04/28/2015 1525   HDL 43 (L) 04/28/2015 1525   CHOLHDL 4.5 04/28/2015 1525   VLDL 38 (H) 04/28/2015 1525   LDLCALC 114 04/28/2015 1525      Wt Readings from Last 3 Encounters:  04/20/16 70.2 kg (154 lb 12.8 oz)  12/09/15 70.4 kg (155 lb 3.2 oz)  11/25/15 71.2 kg (157 lb)      ASSESSMENT AND PLAN:  # Palpitations: Symptoms are well-controlled on propranolol and digoxin. We will not make any changes at this time. Digoxin level was therapeutic 11/2015.  # Hypertension: Blood pressure is well-controlled. Continue lisinopril and propranolol.  # Tobacco abuse: Ms. Ramsour was again encouraged to quit smoking. She struggles with due to the recent death of her sister.   # Chest pain:  # Shortness of breath: # Chronic diastolic heart failure:  Symptoms have resolved. Stress testing was negative for ischemia and her echo showed grade 2 diastolic dysfunction. Her breathing has improved with the addition of lasix.  Continue lasix, metoprolol and propranolol.  # Carotid stenosis:  repeat carotid ultrasound. We will obtain lipids from her PCP.   Current medicines are reviewed at length with the patient today.  The patient does not have concerns regarding medicines.  The following changes have been made:  No changes.  Labs/ tests ordered today include:   Orders Placed This Encounter  Procedures  . EKG 12-Lead    Disposition:   FU with Destiny Engelstad C. Oval Linsey, MD, [facility] in 1 month.    This note was written with the assistance of speech recognition software.  Please excuse any transcriptional  errors.  Signed, Lam Bjorklund C. Oval Linsey, MD, [facility]  04/20/2016 7:09 PM    [place]

## 2016-04-26 DIAGNOSIS — Z79891 Long term (current) use of opiate analgesic: Secondary | ICD-10-CM | POA: Diagnosis not present

## 2016-04-26 DIAGNOSIS — M791 Myalgia: Secondary | ICD-10-CM | POA: Diagnosis not present

## 2016-04-26 DIAGNOSIS — M5481 Occipital neuralgia: Secondary | ICD-10-CM | POA: Diagnosis not present

## 2016-04-26 DIAGNOSIS — G894 Chronic pain syndrome: Secondary | ICD-10-CM | POA: Diagnosis not present

## 2016-04-27 ENCOUNTER — Encounter: Payer: Self-pay | Admitting: Pulmonary Disease

## 2016-04-27 ENCOUNTER — Ambulatory Visit (INDEPENDENT_AMBULATORY_CARE_PROVIDER_SITE_OTHER): Payer: Medicare Other | Admitting: Pulmonary Disease

## 2016-04-27 VITALS — BP 132/80 | HR 51 | Ht 64.0 in | Wt 152.4 lb

## 2016-04-27 DIAGNOSIS — J449 Chronic obstructive pulmonary disease, unspecified: Secondary | ICD-10-CM | POA: Diagnosis not present

## 2016-04-27 DIAGNOSIS — F17200 Nicotine dependence, unspecified, uncomplicated: Secondary | ICD-10-CM | POA: Diagnosis not present

## 2016-04-27 DIAGNOSIS — K219 Gastro-esophageal reflux disease without esophagitis: Secondary | ICD-10-CM | POA: Diagnosis not present

## 2016-04-27 DIAGNOSIS — I6529 Occlusion and stenosis of unspecified carotid artery: Secondary | ICD-10-CM | POA: Diagnosis not present

## 2016-04-27 DIAGNOSIS — F17210 Nicotine dependence, cigarettes, uncomplicated: Secondary | ICD-10-CM | POA: Diagnosis not present

## 2016-04-27 MED ORDER — ROFLUMILAST 500 MCG PO TABS: 500.0000 ug | ORAL_TABLET | Freq: Every day | ORAL | 0 refills | Status: DC

## 2016-04-27 NOTE — Addendum Note (Signed)
Addended by: Tyson Dense on: 04/27/2016 02:19 PM   Modules accepted: Orders

## 2016-04-27 NOTE — Addendum Note (Signed)
Addended by: Tyson Dense on: 04/27/2016 11:59 AM   Modules accepted: Orders

## 2016-04-27 NOTE — Patient Instructions (Addendum)
   Try using Guaifenesin instead of Mucinex. It is the active ingredient.  Try to work on your cigarette use.  Seriously consider taking Ranitidine/Zantac 75mg  at night before bed or Prevcid at night before bed to help with your reflux.  Try taking 1 pill of the Daliresp every day or every other day depending on how you tolerate it. Call me or e-mail me and let me know how you are doing with it.  I will see you back in 3 months or sooner if needed.

## 2016-04-27 NOTE — Progress Notes (Signed)
Subjective:    Patient ID: Destiny Hughes, female    DOB: Mar 04, 1946, 71 y.o.   MRN: [id number]  C.C.:  Follow-up for Very Severe COPD w/ Emphysema, GERD, & Tobacco Use Disorder.  HPI  Very Severe COPD w/ Emphysema: Patient given sample of Breo 100 at last appointment. Previously was on theophylline but she only took it for 3-4 days at a time and feels it significantly helped. She reports she has been wheezing more with the increased humidity and moisture. She reports Breo didn't help her cough or mucus expectoration. She is still coughing up yellow mucus. She is worried that her lungs will become "dependent" on inhalers.  GERD: Recommended Prevacid on a daily basis at last appointment. She hasn't been using Prevacid nightly due to concern for the potential side effects. She is continuing to have dysphagia, especially with "big pills". She hasn't yet discussed a referral to GI at [facility] with Dr. Claria Dice.   Tobacco Use Disorder:  She reports she is continuing to smoke cigarettes. She attributes this to the recent & unexpected death of her sister.  Review of Systems  She continues to have pain and discomfort in her right chest. Denies any new chest pain or pressure. No fever or sweats. Does have intermittent chills. No rashes but does bruise easily. She reports that she developed cataracts and these seem to be affecting her vision.   Allergies  Allergen Reactions  . Ventolin [Kdc:Albuterol] Anaphylaxis  . Albuterol     Had difficulty breathing after a nebulizer treatment  . Codeine Nausea And Vomiting  . Penicillins Swelling  . Ecotrin [Aspirin] Nausea And Vomiting   Current Outpatient Prescriptions on File Prior to Visit  Medication Sig Dispense Refill  . Biotin (BIOTIN 5000) 5 MG CAPS Take 1 capsule by mouth daily.      . Cholecalciferol (VITAMIN D PO) Take 5,000 Units by mouth daily.    . digoxin (DIGOX) 0.25 MG tablet Take 1 tablet (250 mcg total) by mouth every morning. 90  tablet 1  . furosemide (LASIX) 20 MG tablet Take 1 tablet (20 mg total) by mouth daily. 30 tablet 5  . levothyroxine (SYNTHROID, LEVOTHROID) 150 MCG tablet TAKE 1 TO 1 AND 1/2 TABLETS BY MOUTH DAILY AS DIRECTED 135 tablet 1  . lisinopril (PRINIVIL,ZESTRIL) 10 MG tablet TAKE 1 TABLET(10 MG) BY MOUTH DAILY 30 tablet 8  . methadone (DOLOPHINE) 5 MG tablet Take 5 mg by mouth every 6 (six) hours.     Marland Kitchen oxyCODONE (ROXICODONE) 15 MG immediate release tablet Take 1 tablet by mouth every 6 (six) hours as needed.  0  . propranolol (INDERAL) 80 MG tablet Take 1 tablet (80 mg total) by mouth 2 (two) times daily. 180 tablet 1  . sertraline (ZOLOFT) 100 MG tablet Take 200 mg by mouth 2 (two) times daily.     . diazepam (VALIUM) 5 MG tablet Take 5 mg by mouth every 6 (six) hours as needed (for muscle spams).     . [DISCONTINUED] ferrous sulfate 325 (65 FE) MG tablet Take 1 tablet (325 mg total) by mouth 3 (three) times daily after meals.    . [DISCONTINUED] rivaroxaban (XARELTO) 10 MG TABS tablet Take 1 tablet (10 mg total) by mouth daily. 12 tablet    No current facility-administered medications on file prior to visit.    Past Medical History:  Diagnosis Date  . Arthritis 01-25-11   back and most joints  . Blood transfusion  after Thoracic surgery ?'04  . Chronic back pain   . Chronic neck pain   . Collapse of right lung   . COPD (chronic obstructive pulmonary disease) (HCC)    Hx. Bronchitis occ, 01-25-11 somes issue now taking  Z-pak-started 01-24-11/Scar tissue  present  from previous lung collapse  . Depression   . Dyspnea   . Dysrhythmia    hx.PAT- tx. Inderal  . Emphysema of lung ([place])   . GERD (gastroesophageal reflux disease)    tx. TUMS  . Glaucoma   . Headache(784.0) 01-25-11   neck and nerve pain related.  Marland Kitchen Heart murmur   . Hyperlipemia   . Hypothyroidism    tx. Levothyroxine  . Nephrolithiasis   . Neuromuscular disorder ([place]) 01-25-11   Pain/nerve stimulator implanted -2 yrs ago.   . Palpitation   . Tachycardia    Past Surgical History:  Procedure Laterality Date  . APPENDECTOMY  01-25-11  . CARPAL TUNNEL RELEASE  01-25-11   Bil.  . CERVICAL SPINE SURGERY  01-25-11   2005-multiple levels  . CHOLECYSTECTOMY  01-25-11   '07  . COLONOSCOPY WITH PROPOFOL N/A 07/23/2012   Procedure: COLONOSCOPY WITH PROPOFOL;  Surgeon: Garlan Fair, MD;  Location: [facility];  Service: Endoscopy;  Laterality: N/A;  . JOINT REPLACEMENT  01-25-11   6'03 LTHA-hemi  . KNEE ARTHROPLASTY  01-25-11   '04-right, revised x2  . SPINAL CORD STIMULATOR IMPLANT  2009 APPROX   DR. ELSNER  . THORACIC SPINE SURGERY  01-25-11   6'02- then nerve stimulator implanted after  . TOTAL KNEE REVISION  02/01/2011   Procedure: TOTAL KNEE REVISION;  Surgeon: Mauri Pole;  Location: [facility];  Service: Orthopedics;  Laterality: Right;  . TUBAL LIGATION     Family History  Problem Relation Age of Onset  . Heart attack Father   . Emphysema Father   . Aneurysm Mother     CEREBRAL  . Emphysema Mother   . Diabetes Son   . Hypertension Son   . Hypertension Son   . Cancer Sister     Widely Metastatic  . Asthma Son     x2  . Breast cancer Paternal Aunt   . Rheum arthritis Maternal Aunt    Social History   Social History  . Marital status: Married    Spouse name: N/A  . Number of children: 2  . Years of education: N/A   Occupational History  . disabled    Social History Main Topics  . Smoking status: Current Every Day Smoker    Packs/day: 0.75    Years: 50.00    Types: Cigarettes    Start date: 09/13/1961  . Smokeless tobacco: Never Used     Comment: Peak rate of 1ppd  . Alcohol use No     Comment: none in 10 years  . Drug use: No  . Sexual activity: No   Other Topics Concern  . None   Social History Narrative   She is from [facility]. She has always lived in [place]. She has traveled to [place], [place], [place], [place], & [place]. Worked as a Merchandiser, retail. Worked in a Estate agent. Worked in a  [employer] in a very dusty doffing room. She worked in Pensions consultant. Prior exposure to parakeets with last exposure remote in her current home. Previously had mold in her home that was in her bathroom & fixed. Prior exposure to hot tubs but none recently.  Objective:   Physical Exam BP 132/80 (BP Location: Right Arm, Patient Position: Sitting, Cuff Size: Normal)   Pulse (!) 51   Ht 5\' 4"  (1.626 m)   Wt 152 lb 6.4 oz (69.1 kg)   SpO2 93%   BMI 26.16 kg/m   General:  Awake. Elderly female. No distress.  Integument:  Warm & dry. No rash on exposed skin.  Extremities:  No cyanosis or clubbing.  HEENT:  Moist mucus membranes. Minimal nasal turbinate swelling. No scleral icterus. Cardiovascular:  Regular rate. No edema. No appreciable JVD.  Pulmonary:  Symmetrically decreased breath sounds. Minimal end expiratory wheeze. No accessory muscle use on room air. Abdomen: Soft. Normal bowel sounds. Nondistended.  Musculoskeletal:  Normal bulk and tone. No joint deformity or effusion appreciated.  PFT 08/27/15: FVC 1.09 L (34%) FEV1 0.75 L (31%) FEV1/FVC 0.69 FEF 25-75 0.49 L (25%)                                                               DLCO uncorrected 42% 12/26/14: FVC 1.23 L (38%) FEV1 0.78 L (32%) FEV1/FVC 0.64 FEF 25-75 0.43 L (21%) TLC 4.96 L (92%) RV 128% ERV 27% DLCO uncorrected 51%  IMAGING HRCT CHEST W/O 11/18/14 (previously reviewed by me): No bronchiectasis or intralobular septal thickening. Diffuse bronchial wall thickening as well as mild centrilobular and paraseptal emphysema upper lobe predominant. Mild tracheobronchomalacia with air trapping. Atherosclerosis noted by radiologist. No mediastinal mass or pathologic adenopathy on my review. No pericardial effusion. No pleural effusion or thickening.  Barium Swallow 11/17/14 (per radiologist): Mild esophageal dysmotility with mild GERD. No stricture, mass, or esophageal mucosal irregularity.  CXR PA/LAT 10/21/14  (previously reviewed by me): Upper thoracic spinal cord stimulator noted. Kyphosis noted. Hyperinflation with flattening of the diaphragms. No pleural effusion or thickening appreciated. No parenchymal opacity or nodule appreciated. Heart normal in size. Mediastinum normal in contour.  CARDIAC NUCLEAR STRESS TEST (09/08/15):  Rest and stress perfusion normal. LV EF 55-65%.   TTE (09/01/15):  LV normal in size. EF 60-65%. Grade 2 diastolic dysfunction. LA normal in size. RA mildly dilated. RV mildly dilated with mild reduction in systolic function. No AS or AR. Mild MR. No TR. No pericardial effusion.   MICROBIOLOGY Sputum (01/06/15): Oral Flora / Candida albicans / AFB negative  LABS 10/13/15 CBC:  10.3/16.4/48.5/253  12/26/14 IgG: 1090 IgA: 345 IgM: 42 IgE: 35 RAST Panel: Goose Feathers 0.11 (class 0/1) Alpha-1 AT: MM (183)  05/21/14 CBC: 7.1/15.2/45.6/270 BMP: 135/4.5/98/33/18/0.91/134/9.3 LFT: 3.9/6.6/0.6/67/13/11 Magnesium: 1.9    Assessment & Plan:  71 y.o. female with known history of very severe COPD with emphysema along with GERD and ongoing tobacco use. Has previously had difficulty affording inhaler medications.Patient is adamant that her breathing and cough would improve with intermittent theophylline use. I sent today educating the patient on the potential negative side effects including arrhythmias, etc. with the use of theophylline. Not to mention, theophylline would not be able to achieve steady state and maximal benefit without daily inconsistent use. Patient has been intolerant of multiple inhaled medications and unwilling to try further. I instructed the patient to notify me if she had any breathing problems before her next appointment and how she tolerated her new medication we are attempting today. Her reflux remains intermittent and  uncontrolled due to her unwillingness to take a daily medication and she has yet to achieve a referral to GI to address her ongoing  dysphagia.   1. Very severe COPD w/ Emphysema: Trying the patient on Daliresp. Recommended she could try using it every other day depending upon GI upset. Also recommended generic [place] son to help with mucus clearance. If these changes are unsuccessful I would consider instituting theophylline at her request. 2. GERD w/ Dysphagia:  Recommended using Prevacid or Zantac at night before bed. Refer to patient and her primary care physician on a GI referral. 3. Tobacco Use Disorder: Spent over 3 minutes counseling the patient on the need for complete tobacco cessation. She has multiple psychosocial stressors and is going to reattempt to quit. 4. Health maintenance: Declined immunization previously.  5. Follow-up: Return to clinic in 3 months or sooner if needed.  Sonia Baller Ashok Cordia, M.D. [facility] Pulmonary & Critical Care Pager:  [phone] After 3pm or if no response, call [phone] 10:40 AM 04/27/16

## 2016-05-05 ENCOUNTER — Other Ambulatory Visit: Payer: Self-pay | Admitting: Pharmacist

## 2016-05-05 ENCOUNTER — Ambulatory Visit: Payer: Self-pay | Admitting: Pharmacist

## 2016-05-05 ENCOUNTER — Telehealth: Payer: Self-pay

## 2016-05-05 NOTE — Telephone Encounter (Signed)
JN  Colleen from [facility] called and wanted to let you know she reached out to this pt. To discuss pt. Assistance for her Daliresp and the pt. Informed her that she never started the Daliresp due to concerns of it causing kidney and liver problems. She stated she went over the side affects and reassured the pt. About her concerns. The pt. Stated she agreed to start the medication. She stated as of now she does not see where she will qualify for cost assistance because of $1000 she would have to spend in cost of medication to be able to apply for patient assistance.

## 2016-05-06 NOTE — Patient Outreach (Signed)
[facility]) Care Management  05/06/2016  Destiny Hughes 10-26-1945 [id number]   [place] Referral: Medication Assistance  71 yo female with PMHx significant for COPD GOLD stage 4D, current smoker, CHF, hypothyroidism, GERD, chronic pain, depression, HTN, palpitations,tachycardia, and history of non-compliance with inhaler medications.   Note patient with allergy to albuterol (anaphylaxis).    Patient with recent office visit to [facility] for COPD follow-up.  Provider gave patient samples of Daliresp (roflumilast) and placed [facility] pharmacy referral for medication assistance.    Successful telephone encounter with patient on 05/05/2016.  Medication reconciliation performed over the phone with patient.   Patient stated she was not taking her Daliresp samples because she had read online that >90% of patients had liver and kidney complications from medication.  Reassured patient that these would be very rare side effects. Counseled patient on more common ADEs of weight loss, mood changes, and nausea/diarrhea.  Patient stated she would start taking the samples today and that she had 2 weeks of medication.  Encouraged patient to call provider office to update if Daliresp was improving her symptoms and schedule another office visit if needed.  Patient also stated she had tried Adair Patter in the past but that this medication hurt her stomach.  Patient not on any inhalers currently.    Patient currently has Medicare Part A/B and [employer] PDP.    Under this plan, Daliresp is a Tier 4 medication and would be $141.54/month.  There is a patient assistance plan with [employer] for Daliresp. Per patient, she meets the annual household income requirement of less than $36,180 for a household of 2.  To qualify for program, patient would need to spend 3% of her annual income on prescription costs which according to estimates from patient would be ~$1000.  Patient expressed  understanding.    Looked into other inhalers under patient's current plan: Spiriva and Caprice Renshaw are non-formulary and not covered.  Breo Ellipta is a Tier 3 medication and would be $35/month.    Plan:  Spoke with RN from [facility] and relayed message regarding medication assistance options for Daliresp and update on patient's non-compliance thus far with samples.    [facility] pharmacy will sign-off case at this time but will be happy to assist in the future if other medication questions or needs arise.  Also, if Daliresp will be chronic medication for patient depending on if she tolerates it, [facility] can assist with patient assistance forms once patient meets ~$1000 out of pocket expenditure on medications for 2018.    Thank you for allowing [facility] pharmacy to be involved in this patient's care.    Ralene Bathe, PharmD, [place] [phone]

## 2016-05-09 DIAGNOSIS — H01025 Squamous blepharitis left lower eyelid: Secondary | ICD-10-CM | POA: Diagnosis not present

## 2016-05-09 DIAGNOSIS — H04123 Dry eye syndrome of bilateral lacrimal glands: Secondary | ICD-10-CM | POA: Diagnosis not present

## 2016-05-09 DIAGNOSIS — H01022 Squamous blepharitis right lower eyelid: Secondary | ICD-10-CM | POA: Diagnosis not present

## 2016-05-09 DIAGNOSIS — H40013 Open angle with borderline findings, low risk, bilateral: Secondary | ICD-10-CM | POA: Diagnosis not present

## 2016-05-09 DIAGNOSIS — H01021 Squamous blepharitis right upper eyelid: Secondary | ICD-10-CM | POA: Diagnosis not present

## 2016-05-09 DIAGNOSIS — H01024 Squamous blepharitis left upper eyelid: Secondary | ICD-10-CM | POA: Diagnosis not present

## 2016-05-09 DIAGNOSIS — H25813 Combined forms of age-related cataract, bilateral: Secondary | ICD-10-CM | POA: Diagnosis not present

## 2016-05-13 ENCOUNTER — Ambulatory Visit (HOSPITAL_COMMUNITY)
Admission: RE | Admit: 2016-05-13 | Discharge: 2016-05-13 | Disposition: A | Discharge status: Home / Self Care | Payer: Medicare Other | Source: Ambulatory Visit | Attending: Cardiology | Admitting: Cardiology

## 2016-05-13 DIAGNOSIS — I6529 Occlusion and stenosis of unspecified carotid artery: Secondary | ICD-10-CM

## 2016-05-14 ENCOUNTER — Other Ambulatory Visit: Payer: Self-pay | Admitting: Internal Medicine

## 2016-05-14 DIAGNOSIS — E039 Hypothyroidism, unspecified: Secondary | ICD-10-CM

## 2016-05-20 ENCOUNTER — Ambulatory Visit (INDEPENDENT_AMBULATORY_CARE_PROVIDER_SITE_OTHER): Payer: Medicare Other | Admitting: Cardiovascular Disease

## 2016-05-20 ENCOUNTER — Encounter: Payer: Self-pay | Admitting: Cardiovascular Disease

## 2016-05-20 VITALS — BP 122/64 | HR 54 | Ht 64.0 in | Wt 154.6 lb

## 2016-05-20 DIAGNOSIS — I6529 Occlusion and stenosis of unspecified carotid artery: Secondary | ICD-10-CM

## 2016-05-20 DIAGNOSIS — Z720 Tobacco use: Secondary | ICD-10-CM | POA: Diagnosis not present

## 2016-05-20 DIAGNOSIS — E7800 Pure hypercholesterolemia, unspecified: Secondary | ICD-10-CM

## 2016-05-20 DIAGNOSIS — R0602 Shortness of breath: Secondary | ICD-10-CM | POA: Diagnosis not present

## 2016-05-20 DIAGNOSIS — R002 Palpitations: Secondary | ICD-10-CM

## 2016-05-20 DIAGNOSIS — I10 Essential (primary) hypertension: Secondary | ICD-10-CM

## 2016-05-20 NOTE — Progress Notes (Signed)
Cardiology Office Note   Date:  05/20/2016   ID:  Destiny Hughes, DOB 1945/12/10, MRN [id number]  PCP:  Minette Brine  Cardiologist:   Skeet Latch, MD   Chief Complaint  Destiny Hughes presents with  . Follow-up    pt c/o occassional DOE, swelling in ankles     History of Present Illness: Destiny Hughes is a 71 y.o. female with hypertension, mitral valve prolapse, COPD, hyperlipidemia, and hypothyroidism who presents for follow up.   She had a normal adenosine Cardiolite in 2007 and in 2011.  She also had an echo 04/1998 that didn't show any MVP.  Her echo 08/10/09 showed mild aortic sclerosis, normal systolic function and abnormal diastolic function.  Destiny Hughes was previously a Destiny Hughes of Dr. Mare Ferrari.  Lisinopril was added to her regimen due to poorly-controlled hypertension.  She reported some atypical chest pain and was referred for a Lexiscan Myoview 08/2015 that was negative for ischemia.  She also had an echo for shortness of breath which revealed LVEF 60-65% with grade 2 diastolic dysfunction and a mildly dilated right ventricle that was also hypokinetic. Her IVC was noted to be dilated. She was started on Lasix 20 mg oral daily and her breathing has improved.  She had carotid Dopplers 05/13/16 that revealed mild bilateral stenosis.   Since her last appointment she has been doing well from a cardiac perspective.  Her main complaint is a sinus infection and tinnitus.  Her breathing has been stable but she remains short of breath with exertion.  She denies chest pain.  She continues to have palpitations that typically occur in the evening.  There is no associated shortness of breath or lightheadedness.  She continues to smoke and is not ready to quit at this time.  She complains of cough but denies lower extremity edema, orthopnea or PND. She is unable to take aspirin due to upset stomach.  She has chronic shortness of breath that is stable.    Past Medical History:    Diagnosis Date  . Arthritis 01-25-11   back and most joints  . Blood transfusion    after Thoracic surgery ?'04  . Chronic back pain   . Chronic neck pain   . Collapse of right lung   . COPD (chronic obstructive pulmonary disease) (HCC)    Hx. Bronchitis occ, 01-25-11 somes issue now taking  Z-pak-started 01-24-11/Scar tissue  present  from previous lung collapse  . Depression   . Dyspnea   . Dysrhythmia    hx.PAT- tx. Inderal  . Emphysema of lung ([place])   . GERD (gastroesophageal reflux disease)    tx. TUMS  . Glaucoma   . Headache(784.0) 01-25-11   neck and nerve pain related.  Marland Kitchen Heart murmur   . Hyperlipemia   . Hypothyroidism    tx. Levothyroxine  . Nephrolithiasis   . Neuromuscular disorder ([place]) 01-25-11   Pain/nerve stimulator implanted -2 yrs ago.  . Palpitation   . Tachycardia     Past Surgical History:  Procedure Laterality Date  . APPENDECTOMY  01-25-11  . CARPAL TUNNEL RELEASE  01-25-11   Bil.  . CERVICAL SPINE SURGERY  01-25-11   2005-multiple levels  . CHOLECYSTECTOMY  01-25-11   '07  . COLONOSCOPY WITH PROPOFOL N/A 07/23/2012   Procedure: COLONOSCOPY WITH PROPOFOL;  Surgeon: Garlan Fair, MD;  Location: [facility];  Service: Endoscopy;  Laterality: N/A;  . JOINT REPLACEMENT  01-25-11   6'03 LTHA-hemi  . KNEE ARTHROPLASTY  01-25-11   '04-right, revised x2  . SPINAL CORD STIMULATOR IMPLANT  2009 APPROX   DR. ELSNER  . THORACIC SPINE SURGERY  01-25-11   6'02- then nerve stimulator implanted after  . TOTAL KNEE REVISION  02/01/2011   Procedure: TOTAL KNEE REVISION;  Surgeon: Mauri Pole;  Location: [facility] ORS;  Service: Orthopedics;  Laterality: Right;  . TUBAL LIGATION       Current Outpatient Prescriptions  Medication Sig Dispense Refill  . Biotin (BIOTIN 5000) 5 MG CAPS Take 1 capsule by mouth daily.      . Cholecalciferol (VITAMIN D PO) Take 5,000 Units by mouth daily.    . digoxin (DIGOX) 0.25 MG tablet Take 1 tablet (250 mcg total) by mouth every  morning. 90 tablet 1  . furosemide (LASIX) 20 MG tablet Take 1 tablet (20 mg total) by mouth daily. 30 tablet 5  . lansoprazole (PREVACID) 15 MG capsule Take 15 mg by mouth daily as needed.    Marland Kitchen levothyroxine (SYNTHROID, LEVOTHROID) 150 MCG tablet TAKE 1 TO 1+1/2 TABLETS DAILY AS DIRECTED 135 tablet 1  . lisinopril (PRINIVIL,ZESTRIL) 10 MG tablet TAKE 1 TABLET(10 MG) BY MOUTH DAILY 30 tablet 8  . methadone (DOLOPHINE) 5 MG tablet Take 5 mg by mouth every 6 (six) hours.     Marland Kitchen oxyCODONE (ROXICODONE) 15 MG immediate release tablet Take 1 tablet by mouth every 6 (six) hours as needed.  0  . propranolol (INDERAL) 80 MG tablet Take 1 tablet (80 mg total) by mouth 2 (two) times daily. 180 tablet 1  . roflumilast (DALIRESP) 500 MCG TABS tablet Take 1 tablet (500 mcg total) by mouth daily. 2 tablet 0  . sertraline (ZOLOFT) 100 MG tablet Take 100 mg by mouth 2 (two) times daily.      No current facility-administered medications for this visit.     Allergies:   Albuterol; Ventolin [kdc:albuterol]; Codeine; Ecotrin [aspirin]; and Penicillins    Social History:  The Destiny Hughes  reports that she has been smoking Cigarettes.  She started smoking about 54 years ago. She has a 37.50 pack-year smoking history. She has never used smokeless tobacco. She reports that she does not drink alcohol or use drugs.   Family History:  The Destiny Hughes's family history includes Aneurysm in her mother; Asthma in her son; Breast cancer in her paternal aunt; Cancer in her sister; Diabetes in her son; Emphysema in her father and mother; Heart attack in her father; Hypertension in her son and son; Rheum arthritis in her maternal aunt.    ROS:  Please see the history of present illness.   Otherwise, review of systems are positive for back pain.   All other systems are reviewed and negative.    PHYSICAL EXAM: VS:  BP 122/64 (BP Location: Left Arm, Destiny Hughes Position: Sitting, Cuff Size: Large)   Pulse (!) 54   Ht 5\' 4"  (1.626 m)   Wt  70.1 kg (154 lb 9.6 oz)   BMI 26.54 kg/m  , BMI Body mass index is 26.54 kg/m. GENERAL:  Well appearing HEENT:  Pupils equal round and reactive, fundi not visualized, oral mucosa unremarkable NECK:  No jugular venous distention, waveform within normal limits, carotid upstroke brisk and symmetric, no bruits, no thyromegaly LYMPHATICS:  No cervical adenopathy LUNGS:  Mild expiratory wheezing bilaterally HEART:  RRR.  PMI not displaced or sustained,S1 and S2 within normal limits, no S3, no S4, no clicks, no rubs, no murmurs ABD:  Flat, positive bowel sounds normal in  frequency in pitch, no bruits, no rebound, no guarding, no midline pulsatile mass, no hepatomegaly, no splenomegaly EXT:  2 plus pulses throughout, no edema, no cyanosis no clubbing SKIN:  No rashes no nodules NEURO:  Cranial nerves II through XII grossly intact, motor grossly intact throughout PSYCH:  Cognitively intact, oriented to person place and time   EKG:  EKG is not ordered today. The ekg ordered 08/13/15 demonstrates sinus bradycardia. Rate 53 bpm. Left anterior fascicular block. Nonspecific ST changes. 04/20/16: Sinus bradycardia.  Rate 54 bpm.  First degree AV block.  Non-specific ST-T changes 05/20/16: Sinus bradycardia.  Rate 54 bpm. LAFB.    Lexiscan Myoview 09/08/15:  Nuclear stress EF: 62%.  There was no ST segment deviation noted during stress.  The study is normal.  This is a low risk study.  The left ventricular ejection fraction is normal (55-65%).  Echo 09/01/15: Study Conclusions  - Left ventricle: The cavity size was normal. Wall thickness was  normal. Systolic function was normal. The estimated ejection  fraction was in the range of 60% to 65%. Wall motion was normal;  there were no regional wall motion abnormalities. Doppler  parameters are consistent with pseudonormal left ventricular  relaxation (grade 2 diastolic dysfunction). The E/e&' ratio is  >15, suggesting elevated LV filling  pressure. - Mitral valve: Mildly thickened leaflets . There was mild  regurgitation. - Left atrium: The atrium was normal in size. - Right ventricle: The cavity size was mildly dilated. Systolic  function is reduced. - Right atrium: The atrium was mildly dilated. - Inferior vena cava: The vessel was dilated. The respirophasic  diameter changes were blunted (< 50%), consistent with elevated  central venous pressure.  Impressions:  - LVEF 60-65%, normal wall thickness, normal wall motion, stage 2  DD with elevated LV filling pressure, mild MR, normal LA size,  mild RAE, dilated RV with reduced systolic function, dilated IVC,  no TR.  Carotid Dopplers 05/13/16: 1-39% ICA stenosis bilaterally.  Recent Labs: 10/13/2015: Hemoglobin 16.4; Platelets 253 12/17/2015: BUN 13; Creat 0.87; Magnesium 1.7; Potassium 4.3; Sodium 133   12/17/15: Digoxin 1.9  Lipid Panel    Component Value Date/Time   CHOL 195 04/28/2015 1525   TRIG 188 (H) 04/28/2015 1525   HDL 43 (L) 04/28/2015 1525   CHOLHDL 4.5 04/28/2015 1525   VLDL 38 (H) 04/28/2015 1525   LDLCALC 114 04/28/2015 1525      Wt Readings from Last 3 Encounters:  05/20/16 70.1 kg (154 lb 9.6 oz)  04/27/16 69.1 kg (152 lb 6.4 oz)  04/20/16 70.2 kg (154 lb 12.8 oz)      ASSESSMENT AND PLAN:  # Palpitations: Symptoms are well-controlled on propranolol and digoxin.  She has some palpitations in the evening   Digoxin level was therapeutic 11/2015.  # Hypertension: Blood pressure is well-controlled. Continue lisinopril and propranolol.  # Tobacco abuse: Destiny Hughes was again encouraged to quit smoking. She is not interested in quitting at this time.  # Chest pain:  # Shortness of breath: # Chronic diastolic heart failure:  Symptoms have resolved. Stress testing was negative for ischemia and her echo showed grade 2 diastolic dysfunction. Her breathing is stable.  I suspect that COPD is also contributing to her shortness of  breath.  # Carotid stenosis:  Mild carotid stenosis 04/2016.  Recommend aspirin and a statin but she refuses.  Destiny Hughes reports that lipids were recently checked with her PCP.  Current medicines are reviewed at length with the  Destiny Hughes today.  The Destiny Hughes does not have concerns regarding medicines.  The following changes have been made:  No changes.  Labs/ tests ordered today include:   No orders of the defined types were placed in this encounter.   Disposition:   FU with Jelani Trueba C. Oval Linsey, MD, [facility] in 6 months.    This note was written with the assistance of speech recognition software.  Please excuse any transcriptional errors.  Signed, Zanovia Rotz C. Oval Linsey, MD, [facility]  05/20/2016 11:12 AM    [place]

## 2016-05-20 NOTE — Patient Instructions (Signed)

## 2016-05-22 ENCOUNTER — Other Ambulatory Visit: Payer: Self-pay | Admitting: Internal Medicine

## 2016-05-22 DIAGNOSIS — E039 Hypothyroidism, unspecified: Secondary | ICD-10-CM

## 2016-05-24 DIAGNOSIS — Z79891 Long term (current) use of opiate analgesic: Secondary | ICD-10-CM | POA: Diagnosis not present

## 2016-05-24 DIAGNOSIS — M961 Postlaminectomy syndrome, not elsewhere classified: Secondary | ICD-10-CM | POA: Diagnosis not present

## 2016-05-24 DIAGNOSIS — G894 Chronic pain syndrome: Secondary | ICD-10-CM | POA: Diagnosis not present

## 2016-05-24 DIAGNOSIS — M5481 Occipital neuralgia: Secondary | ICD-10-CM | POA: Diagnosis not present

## 2016-05-26 DIAGNOSIS — H2512 Age-related nuclear cataract, left eye: Secondary | ICD-10-CM | POA: Diagnosis not present

## 2016-06-02 ENCOUNTER — Encounter (HOSPITAL_COMMUNITY): Payer: Self-pay

## 2016-06-02 ENCOUNTER — Emergency Department (HOSPITAL_COMMUNITY)
Admission: EM | Admit: 2016-06-02 | Discharge: 2016-06-02 | Disposition: A | Discharge status: Home / Self Care | Payer: Medicare Other | Attending: Emergency Medicine | Admitting: Emergency Medicine

## 2016-06-02 ENCOUNTER — Emergency Department (HOSPITAL_COMMUNITY): Payer: Medicare Other

## 2016-06-02 DIAGNOSIS — R410 Disorientation, unspecified: Secondary | ICD-10-CM

## 2016-06-02 DIAGNOSIS — F17210 Nicotine dependence, cigarettes, uncomplicated: Secondary | ICD-10-CM | POA: Insufficient documentation

## 2016-06-02 DIAGNOSIS — Z96651 Presence of right artificial knee joint: Secondary | ICD-10-CM | POA: Diagnosis not present

## 2016-06-02 DIAGNOSIS — Z79899 Other long term (current) drug therapy: Secondary | ICD-10-CM | POA: Diagnosis not present

## 2016-06-02 DIAGNOSIS — G894 Chronic pain syndrome: Secondary | ICD-10-CM | POA: Diagnosis not present

## 2016-06-02 DIAGNOSIS — Z96642 Presence of left artificial hip joint: Secondary | ICD-10-CM | POA: Diagnosis not present

## 2016-06-02 DIAGNOSIS — I110 Hypertensive heart disease with heart failure: Secondary | ICD-10-CM | POA: Insufficient documentation

## 2016-06-02 DIAGNOSIS — Z79891 Long term (current) use of opiate analgesic: Secondary | ICD-10-CM | POA: Diagnosis not present

## 2016-06-02 DIAGNOSIS — E039 Hypothyroidism, unspecified: Secondary | ICD-10-CM | POA: Diagnosis not present

## 2016-06-02 DIAGNOSIS — I5030 Unspecified diastolic (congestive) heart failure: Secondary | ICD-10-CM | POA: Insufficient documentation

## 2016-06-02 DIAGNOSIS — J449 Chronic obstructive pulmonary disease, unspecified: Secondary | ICD-10-CM | POA: Insufficient documentation

## 2016-06-02 DIAGNOSIS — R1031 Right lower quadrant pain: Secondary | ICD-10-CM | POA: Diagnosis not present

## 2016-06-02 DIAGNOSIS — M961 Postlaminectomy syndrome, not elsewhere classified: Secondary | ICD-10-CM | POA: Diagnosis not present

## 2016-06-02 DIAGNOSIS — R1010 Upper abdominal pain, unspecified: Secondary | ICD-10-CM

## 2016-06-02 DIAGNOSIS — R1084 Generalized abdominal pain: Secondary | ICD-10-CM | POA: Diagnosis present

## 2016-06-02 DIAGNOSIS — R1011 Right upper quadrant pain: Secondary | ICD-10-CM | POA: Insufficient documentation

## 2016-06-02 DIAGNOSIS — R001 Bradycardia, unspecified: Secondary | ICD-10-CM | POA: Diagnosis not present

## 2016-06-02 DIAGNOSIS — R109 Unspecified abdominal pain: Secondary | ICD-10-CM | POA: Diagnosis not present

## 2016-06-02 LAB — CBC
HCT: 48.9 % — ABNORMAL HIGH (ref 36.0–46.0)
Hemoglobin: 16.7 g/dL — ABNORMAL HIGH (ref 12.0–15.0)
MCH: 28.9 pg (ref 26.0–34.0)
MCHC: 34.2 g/dL (ref 30.0–36.0)
MCV: 84.7 fL (ref 78.0–100.0)
PLATELETS: 317 10*3/uL (ref 150–400)
RBC: 5.77 MIL/uL — ABNORMAL HIGH (ref 3.87–5.11)
RDW: 13.7 % (ref 11.5–15.5)
WBC: 11 10*3/uL — ABNORMAL HIGH (ref 4.0–10.5)

## 2016-06-02 LAB — DIFFERENTIAL
BASOS PCT: 0 %
Basophils Absolute: 0 10*3/uL (ref 0.0–0.1)
EOS PCT: 0 %
Eosinophils Absolute: 0 10*3/uL (ref 0.0–0.7)
Lymphocytes Relative: 14 %
Lymphs Abs: 1.6 10*3/uL (ref 0.7–4.0)
MONO ABS: 0.8 10*3/uL (ref 0.1–1.0)
MONOS PCT: 7 %
NEUTROS ABS: 9.2 10*3/uL — AB (ref 1.7–7.7)
Neutrophils Relative %: 79 %

## 2016-06-02 LAB — URINALYSIS, ROUTINE W REFLEX MICROSCOPIC
BILIRUBIN URINE: NEGATIVE
Glucose, UA: NEGATIVE mg/dL
Hgb urine dipstick: NEGATIVE
KETONES UR: NEGATIVE mg/dL
Leukocytes, UA: NEGATIVE
NITRITE: NEGATIVE
PH: 7 (ref 5.0–8.0)
PROTEIN: NEGATIVE mg/dL
Specific Gravity, Urine: 1.006 (ref 1.005–1.030)

## 2016-06-02 LAB — BASIC METABOLIC PANEL
BUN: 14 (ref 4–21)
Creatinine: 1 (ref <=1.1)
Glucose: 129
Potassium: 3.8 (ref 3.4–5.3)
SODIUM: 129 — AB (ref 137–147)

## 2016-06-02 LAB — HEPATIC FUNCTION PANEL
ALT: 21 (ref 7–35)
AST: 20 (ref 13–35)
Alkaline Phosphatase: 73 (ref 25–125)

## 2016-06-02 LAB — COMPREHENSIVE METABOLIC PANEL
ALBUMIN: 4.4 g/dL (ref 3.5–5.0)
ALK PHOS: 73 U/L (ref 38–126)
ALT: 21 U/L (ref 14–54)
AST: 20 U/L (ref 15–41)
Anion gap: 9 (ref 5–15)
BILIRUBIN TOTAL: 1.1 mg/dL (ref 0.3–1.2)
BUN: 14 mg/dL (ref 6–20)
CALCIUM: 9.7 mg/dL (ref 8.9–10.3)
CO2: 30 mmol/L (ref 22–32)
CREATININE: 1.01 mg/dL — AB (ref 0.44–1.00)
Chloride: 90 mmol/L — ABNORMAL LOW (ref 101–111)
GFR calc Af Amer: >60 mL/min (ref >60)
GFR, EST NON AFRICAN AMERICAN: 54 mL/min — AB (ref >60)
GLUCOSE: 129 mg/dL — AB (ref 65–99)
Potassium: 3.8 mmol/L (ref 3.5–5.1)
Sodium: 129 mmol/L — ABNORMAL LOW (ref 135–145)
Total Protein: 8.1 g/dL (ref 6.5–8.1)

## 2016-06-02 LAB — CBC AND DIFFERENTIAL
HCT: 49 — AB (ref 36–46)
HEMOGLOBIN: 16.7 — AB (ref 12.0–16.0)
Platelets: 317 (ref 150–399)
WBC: 11

## 2016-06-02 LAB — CBG MONITORING, ED: GLUCOSE-CAPILLARY: 146 mg/dL — AB (ref 65–99)

## 2016-06-02 LAB — AMMONIA: Ammonia: 21 umol/L (ref 9–35)

## 2016-06-02 LAB — TROPONIN I

## 2016-06-02 LAB — LIPASE, BLOOD: Lipase: 22 U/L (ref 11–51)

## 2016-06-02 MED ORDER — IOPAMIDOL (ISOVUE-300) INJECTION 61%
100.0000 mL | Freq: Once | INTRAVENOUS | Status: AC | PRN
  Administered 2016-06-02: 100 mL via INTRAVENOUS

## 2016-06-02 MED ORDER — SODIUM CHLORIDE 0.9 % IV BOLUS (SEPSIS)
500.0000 mL | Freq: Once | INTRAVENOUS | Status: AC
  Administered 2016-06-02: 500 mL via INTRAVENOUS

## 2016-06-02 MED ORDER — PANTOPRAZOLE SODIUM 20 MG PO TBEC: 20.0000 mg | DELAYED_RELEASE_TABLET | Freq: Every day | ORAL | 0 refills | Status: DC

## 2016-06-02 MED ORDER — IOPAMIDOL (ISOVUE-300) INJECTION 61%
INTRAVENOUS | Status: AC
  Filled 2016-06-02: qty 100

## 2016-06-02 NOTE — ED Triage Notes (Signed)
Patient reports having abdominal pain and confusion 3 days ago.  Pain level 8/10.  Patient is leaning over in wheelchair with eyes closed.  Patients husband reports the patient will ask the same questions over and over and doesn't seem to understand what is going on.  Patient states inability to think straight.  Patient went to the doctor this morning at 9:30 and was directed to the ED due to the abdominal pain and confusion.  Patient is unable to have an MRI due to battery pack for nerve processor.

## 2016-06-02 NOTE — ED Notes (Signed)
Patient ambulated to restroom with stand-by assist. Pt transported to CT.

## 2016-06-02 NOTE — ED Notes (Signed)
Delay in labs, patient in restroom.

## 2016-06-02 NOTE — ED Provider Notes (Signed)
[facility] DEPT Provider Note   CSN: [id number] Arrival date & time: 06/02/16  3532      History   Chief Complaint Chief Complaint  Patient presents with  . Abdominal Pain  . Altered Mental Status    HPI EMONY DORMER is a 71 y.o. female.  HPI  71 year old female presents with abdominal pain and confusion. History is obtained from the family and patient at the bedside. Patient has complained of abdominal pain that she states is diffuse but mostly upper abdomen. She has not had any chest pain. Her urinary symptoms. No diarrhea, constipation, or vomiting. She has had some nausea. No cough or shortness of breath. Has not had fevers during this time. The abdominal pain is constant except when she is asleep. Does not seem to be worse with eating and she ate this morning. Family also is concerned because she's been confused for the last 2 or 3 days. Went to see her pain specialist today because he thought it might be a reaction to her pain medicine and was sent here. She was put on 10 as a dean for the first time 6 days ago. Husband stops this 3 days ago because he started noticing her getting confused. He has also stopped her methadone over the last 2 days due to concern for confusion. She had cataract surgery one week ago and is on eyedrops but the family does not refer the name. They think it's an antibiotic and steroid.  Past Medical History:  Diagnosis Date  . Arthritis 01-25-11   back and most joints  . Blood transfusion    after Thoracic surgery ?'04  . Chronic back pain   . Chronic neck pain   . Collapse of right lung   . COPD (chronic obstructive pulmonary disease) (HCC)    Hx. Bronchitis occ, 01-25-11 somes issue now taking  Z-pak-started 01-24-11/Scar tissue  present  from previous lung collapse  . Depression   . Dyspnea   . Dysrhythmia    hx.PAT- tx. Inderal  . Emphysema of lung ([place])   . GERD (gastroesophageal reflux disease)    tx. TUMS  . Glaucoma   .  Headache(784.0) 01-25-11   neck and nerve pain related.  Marland Kitchen Heart murmur   . Hyperlipemia   . Hypothyroidism    tx. Levothyroxine  . Nephrolithiasis   . Neuromuscular disorder ([place]) 01-25-11   Pain/nerve stimulator implanted -2 yrs ago.  . Palpitation   . Tachycardia     Patient Active Problem List   Diagnosis Date Noted  . Diastolic CHF ([place]) 99/24/2683  . Medication management 04/28/2015  . Emphysema lung ([place]) 12/26/2014  . GERD (gastroesophageal reflux disease) 12/26/2014  . Chronic bronchitis ([place]) 11/13/2014  . COPD (chronic obstructive pulmonary disease) ([place]) 10/03/2014  . At high risk for falls 10/03/2014  . Hypothyroidism 10/03/2014  . Mitral valve prolapse 10/03/2014  . Encounter for Medicare annual wellness exam 10/03/2014  . IBS (irritable bowel syndrome) 04/01/2014  . H/O total knee replacement 04/22/2013  . Essential hypertension 04/15/2013  . Vitamin D deficiency 04/15/2013  . Encounter for long-term (current) use of medications 04/15/2013  . Prediabetes 04/15/2013  . Osteoarthritis 01/31/2011  . Hyperlipemia   . Glaucoma   . Chronic neck pain   . Chronic back pain     Past Surgical History:  Procedure Laterality Date  . APPENDECTOMY  01-25-11  . CARPAL TUNNEL RELEASE  01-25-11   Bil.  . CERVICAL SPINE SURGERY  01-25-11   2005-multiple  levels  . CHOLECYSTECTOMY  01-25-11   '07  . COLONOSCOPY WITH PROPOFOL N/A 07/23/2012   Procedure: COLONOSCOPY WITH PROPOFOL;  Surgeon: Garlan Fair, MD;  Location: [facility];  Service: Endoscopy;  Laterality: N/A;  . EYE SURGERY    . JOINT REPLACEMENT  01-25-11   6'03 LTHA-hemi  . KNEE ARTHROPLASTY  01-25-11   '04-right, revised x2  . SPINAL CORD STIMULATOR IMPLANT  2009 APPROX   DR. ELSNER  . THORACIC SPINE SURGERY  01-25-11   6'02- then nerve stimulator implanted after  . TOTAL KNEE REVISION  02/01/2011   Procedure: TOTAL KNEE REVISION;  Surgeon: Mauri Pole;  Location: [facility];  Service: Orthopedics;   Laterality: Right;  . TUBAL LIGATION      OB History    No data available       Home Medications    Prior to Admission medications   Medication Sig Start Date End Date Taking? Authorizing Provider  Biotin (BIOTIN 5000) 5 MG CAPS Take 1 capsule by mouth daily.     Yes Historical Provider, MD  Cholecalciferol (VITAMIN D PO) Take 5,000 Units by mouth daily.   Yes Historical Provider, MD  digoxin (DIGOX) 0.25 MG tablet Take 1 tablet (250 mcg total) by mouth every morning. 01/22/16  Yes Skeet Latch, MD  furosemide (LASIX) 20 MG tablet Take 1 tablet (20 mg total) by mouth daily. 02/22/16  Yes Skeet Latch, MD  levothyroxine (SYNTHROID, LEVOTHROID) 150 MCG tablet TAKE 1 TO 1+1/2 TABLETS DAILY AS DIRECTED 05/14/16  Yes Unk Pinto, MD  lisinopril (PRINIVIL,ZESTRIL) 10 MG tablet TAKE 1 TABLET(10 MG) BY MOUTH DAILY 03/23/16  Yes Skeet Latch, MD  methadone (DOLOPHINE) 5 MG tablet Take 5 mg by mouth every 6 (six) hours.    Yes Historical Provider, MD  oxyCODONE (ROXICODONE) 15 MG immediate release tablet Take 1 tablet by mouth every 6 (six) hours as needed for pain.  07/31/15  Yes Historical Provider, MD  propranolol (INDERAL) 80 MG tablet Take 1 tablet (80 mg total) by mouth 2 (two) times daily. 01/22/16  Yes Skeet Latch, MD  sertraline (ZOLOFT) 100 MG tablet Take 100 mg by mouth at bedtime.    Yes Historical Provider, MD  pantoprazole (PROTONIX) 20 MG tablet Take 1 tablet (20 mg total) by mouth daily. 06/02/16   Sherwood Gambler, MD  roflumilast (DALIRESP) 500 MCG TABS tablet Take 1 tablet (500 mcg total) by mouth daily. Patient not taking: Reported on 06/02/2016 04/27/16   Javier Glazier, MD  tiZANidine (ZANAFLEX) 4 MG tablet Take 4 mg by mouth every 6 (six) hours as needed for muscle spasms.    Historical Provider, MD    Family History Family History  Problem Relation Age of Onset  . Heart attack Father   . Emphysema Father   . Aneurysm Mother     CEREBRAL  . Emphysema  Mother   . Diabetes Son   . Hypertension Son   . Hypertension Son   . Cancer Sister     Widely Metastatic  . Asthma Son     x2  . Breast cancer Paternal Aunt   . Rheum arthritis Maternal Aunt     Social History Social History  Substance Use Topics  . Smoking status: Current Every Day Smoker    Packs/day: 0.75    Years: 50.00    Types: Cigarettes    Start date: 09/13/1961  . Smokeless tobacco: Never Used     Comment: Peak rate of 1ppd  .  Alcohol use No     Comment: none in 10 years     Allergies   Albuterol; Ventolin [kdc:albuterol]; Sulfa antibiotics; Codeine; Ecotrin [aspirin]; and Penicillins   Review of Systems Review of Systems  Constitutional: Negative for fever.  Respiratory: Negative for cough and shortness of breath.   Cardiovascular: Negative for chest pain.  Gastrointestinal: Positive for abdominal pain. Negative for constipation, diarrhea, nausea and vomiting.  Genitourinary: Negative for dysuria.  Musculoskeletal: Negative for back pain.  Psychiatric/Behavioral: Positive for confusion.  All other systems reviewed and are negative.    Physical Exam Updated Vital Signs BP (!) 151/80   Pulse (!) 55   Temp 98.3 F (36.8 C) (Oral)   Resp 19   Ht 5\' 6"  (1.676 m)   Wt 155 lb (70.3 kg)   SpO2 100%   BMI 25.02 kg/m   Physical Exam  Constitutional: She is oriented to person, place, and time. She appears well-developed and well-nourished.  HENT:  Head: Normocephalic and atraumatic.  Right Ear: External ear normal.  Left Ear: External ear normal.  Nose: Nose normal.  Eyes: EOM are normal. Pupils are equal, round, and reactive to light. Right eye exhibits no discharge. Left eye exhibits no discharge.  Neck: Neck supple.  Cardiovascular: Normal rate, regular rhythm and normal heart sounds.   Pulmonary/Chest: Effort normal and breath sounds normal.  Abdominal: Soft. There is no tenderness.  Neurological: She is alert and oriented to person, place, and  time.  Awake, alert to self and place. Disoriented to day of week, month, year. Knows who president is. CN 3-12 grossly intact. 5/5 strength in all 4 extremities. Grossly normal sensation. Normal finger to nose.   Skin: Skin is warm and dry.  Nursing note and vitals reviewed.    ED Treatments / Results  Labs (all labs ordered are listed, but only abnormal results are displayed) Labs Reviewed  COMPREHENSIVE METABOLIC PANEL - Abnormal; Notable for the following:       Result Value   Sodium 129 (*)    Chloride 90 (*)    Glucose, Bld 129 (*)    Creatinine, Ser 1.01 (*)    GFR calc non Af Amer 54 (*)    All other components within normal limits  CBC - Abnormal; Notable for the following:    WBC 11.0 (*)    RBC 5.77 (*)    Hemoglobin 16.7 (*)    HCT 48.9 (*)    All other components within normal limits  DIFFERENTIAL - Abnormal; Notable for the following:    Neutro Abs 9.2 (*)    All other components within normal limits  CBG MONITORING, ED - Abnormal; Notable for the following:    Glucose-Capillary 146 (*)    All other components within normal limits  LIPASE, BLOOD  URINALYSIS, ROUTINE W REFLEX MICROSCOPIC  TROPONIN I  AMMONIA    EKG  EKG Interpretation  Date/Time:  Thursday June 02 2016 11:38:59 EDT Ventricular Rate:  56 PR Interval:    QRS Duration: 92 QT Interval:  391 QTC Calculation: 378 R Axis:   -60 Text Interpretation:  Sinus bradycardia Prolonged PR interval Left anterior fascicular block LVH with secondary repolarization abnormality Baseline wander in lead(s) V3 nonspecific ST changes similar to 2009 Confirmed by Latonya Nelon MD, Alinda Egolf [phone]) on 06/02/2016 12:06:43 PM       Radiology Ct Head Wo Contrast  Result Date: 06/02/2016 CLINICAL DATA:  Confusion for 3 days EXAM: CT HEAD WITHOUT CONTRAST TECHNIQUE: Contiguous axial images  were obtained from the base of the skull through the vertex without intravenous contrast. COMPARISON:  June 29, 2015 FINDINGS: Brain:  Mild diffuse atrophy is stable. There is no appreciable intracranial mass, hemorrhage, extra-axial fluid collection, or midline shift. There is mild patchy small vessel disease in the centra semiovale bilaterally. No new gray-white compartment lesion is evident. No acute infarct appreciable. Vascular: No hyperdense vessel. There is calcification in each carotid siphon region. There are calcifications in distal vertebral arteries bilaterally. Skull: Bony calvarium appears intact. Sinuses/Orbits: There is mild mucosal thickening in several ethmoid air cells bilaterally. Other visualized paranasal sinuses are clear. There is mild leftward deviation of the nasal septum. Orbits appear symmetric bilaterally except for previous cataract removal on the left. Other: Mastoid air cells are clear. IMPRESSION: Stable atrophy with mild periventricular small vessel disease. No intracranial mass, hemorrhage, or extra-axial fluid collection. No acute infarct. Areas of arterial vascular calcification noted. Mild mucosal thickening in several ethmoid air cells. Electronically Signed   By: Lowella Grip III M.D.   On: 06/02/2016 12:39   Ct Abdomen Pelvis W Contrast  Result Date: 06/02/2016 CLINICAL DATA:  Abdominal pain, confusion. EXAM: CT ABDOMEN AND PELVIS WITH CONTRAST TECHNIQUE: Multidetector CT imaging of the abdomen and pelvis was performed using the standard protocol following bolus administration of intravenous contrast. CONTRAST:  166mL ISOVUE-300 IOPAMIDOL (ISOVUE-300) INJECTION 61% COMPARISON:  PET CT 05/17/2005 FINDINGS: Lower chest: No acute findings. Hepatobiliary: Prior cholecystectomy.  No focal hepatic abnormality. Pancreas: Mildly atrophic. No focal abnormality or ductal dilatation. Spleen: No focal abnormality.  Normal size. Adrenals/Urinary Tract: Small cysts in the kidneys bilaterally. No hydronephrosis. Adrenal glands and urinary bladder unremarkable. Stomach/Bowel: Stomach, large and small bowel grossly  unremarkable. Vascular/Lymphatic: Aortic and iliac calcifications. No aneurysm or adenopathy. Reproductive: Uterus and adnexa unremarkable. No mass. Prominent venous structures in the adnexa bilaterally. Other: No free fluid or free air. Musculoskeletal: No acute bony abnormality. IMPRESSION: No acute findings in the abdomen or pelvis. Aortoiliac atherosclerosis. Electronically Signed   By: Rolm Baptise M.D.   On: 06/02/2016 12:58    Procedures Procedures (including critical care time)  Medications Ordered in ED Medications  sodium chloride 0.9 % bolus 500 mL (0 mLs Intravenous Stopped 06/02/16 1205)  iopamidol (ISOVUE-300) 61 % injection 100 mL (100 mLs Intravenous Contrast Given 06/02/16 1237)     Initial Impression / Assessment and Plan / ED Course  I have reviewed the triage vital signs and the nursing notes.  Pertinent labs & imaging results that were available during my care of the patient were reviewed by me and considered in my medical decision making (see chart for details).  Clinical Course as of Jun 03 2007  Thu Jun 02, 2016  1111 Urine is negative. Diffuse abd tenderness, worst in RUQ. Will get CT but may need u/s. Unclear why she's confused, no focal neuro deficits besides orientation. CT head as well.  [SG]    Clinical Course User Index [SG] Sherwood Gambler, MD    CT without acute abnormality. Has had prior cholecystectomy. Possible gastric related (gastritis) vs opiate withdrawal (husband has held methadone, oxycodone for 2 days). Her confusion is mild and she seems to understand she's asking repetitive questions and says she's just not getting answered. Possibly medicine related. Highly doubt CVA. Doubt CNS emergency. Discussed with family, they want to take her home, watch closely and f/u with PCP. THis seems reasonable. Strict return precautions discussed. Mild hyponatremia but I doubt this is cause of confusion as  it is only mildly lower than prior.   Final Clinical  Impressions(s) / ED Diagnoses   Final diagnoses:  Upper abdominal pain  Confusion    New Prescriptions Discharge Medication List as of 06/02/2016  2:19 PM    START taking these medications   Details  pantoprazole (PROTONIX) 20 MG tablet Take 1 tablet (20 mg total) by mouth daily., Starting Thu 06/02/2016, Print         Sherwood Gambler, MD 06/02/16 2010

## 2016-06-02 NOTE — ED Notes (Signed)
Lab reports will add on troponin and differential.

## 2016-06-03 DIAGNOSIS — R7309 Other abnormal glucose: Secondary | ICD-10-CM | POA: Diagnosis not present

## 2016-06-03 DIAGNOSIS — R110 Nausea: Secondary | ICD-10-CM | POA: Diagnosis not present

## 2016-06-03 DIAGNOSIS — Z818 Family history of other mental and behavioral disorders: Secondary | ICD-10-CM | POA: Diagnosis not present

## 2016-06-03 DIAGNOSIS — R109 Unspecified abdominal pain: Secondary | ICD-10-CM | POA: Diagnosis not present

## 2016-06-03 DIAGNOSIS — R413 Other amnesia: Secondary | ICD-10-CM | POA: Diagnosis not present

## 2016-06-10 DIAGNOSIS — E039 Hypothyroidism, unspecified: Secondary | ICD-10-CM | POA: Diagnosis not present

## 2016-06-10 DIAGNOSIS — R413 Other amnesia: Secondary | ICD-10-CM | POA: Diagnosis not present

## 2016-06-24 DIAGNOSIS — G894 Chronic pain syndrome: Secondary | ICD-10-CM | POA: Diagnosis not present

## 2016-06-24 DIAGNOSIS — M961 Postlaminectomy syndrome, not elsewhere classified: Secondary | ICD-10-CM | POA: Diagnosis not present

## 2016-06-24 DIAGNOSIS — R1031 Right lower quadrant pain: Secondary | ICD-10-CM | POA: Diagnosis not present

## 2016-06-24 DIAGNOSIS — Z79891 Long term (current) use of opiate analgesic: Secondary | ICD-10-CM | POA: Diagnosis not present

## 2016-06-30 ENCOUNTER — Encounter: Payer: Self-pay | Admitting: *Deleted

## 2016-07-01 ENCOUNTER — Observation Stay (HOSPITAL_COMMUNITY): Payer: Medicare Other

## 2016-07-01 ENCOUNTER — Emergency Department (HOSPITAL_COMMUNITY): Payer: Medicare Other

## 2016-07-01 ENCOUNTER — Inpatient Hospital Stay (HOSPITAL_COMMUNITY)
Admission: EM | Admit: 2016-07-01 | Discharge: 2016-07-03 | DRG: 683 | Disposition: A | Discharge status: Home Health | Payer: Medicare Other | Attending: Family Medicine | Admitting: Family Medicine

## 2016-07-01 ENCOUNTER — Encounter: Payer: Self-pay | Admitting: Diagnostic Neuroimaging

## 2016-07-01 ENCOUNTER — Ambulatory Visit (INDEPENDENT_AMBULATORY_CARE_PROVIDER_SITE_OTHER): Payer: Medicare Other | Admitting: Diagnostic Neuroimaging

## 2016-07-01 ENCOUNTER — Encounter (HOSPITAL_COMMUNITY): Payer: Self-pay | Admitting: *Deleted

## 2016-07-01 VITALS — BP 78/54 | HR 70 | Ht 66.0 in | Wt 132.4 lb

## 2016-07-01 DIAGNOSIS — I9589 Other hypotension: Secondary | ICD-10-CM | POA: Diagnosis not present

## 2016-07-01 DIAGNOSIS — I959 Hypotension, unspecified: Secondary | ICD-10-CM | POA: Diagnosis present

## 2016-07-01 DIAGNOSIS — I5032 Chronic diastolic (congestive) heart failure: Secondary | ICD-10-CM | POA: Diagnosis not present

## 2016-07-01 DIAGNOSIS — R1084 Generalized abdominal pain: Secondary | ICD-10-CM | POA: Diagnosis not present

## 2016-07-01 DIAGNOSIS — E039 Hypothyroidism, unspecified: Secondary | ICD-10-CM | POA: Diagnosis not present

## 2016-07-01 DIAGNOSIS — I130 Hypertensive heart and chronic kidney disease with heart failure and stage 1 through stage 4 chronic kidney disease, or unspecified chronic kidney disease: Secondary | ICD-10-CM | POA: Diagnosis present

## 2016-07-01 DIAGNOSIS — R739 Hyperglycemia, unspecified: Secondary | ICD-10-CM | POA: Diagnosis present

## 2016-07-01 DIAGNOSIS — G9340 Encephalopathy, unspecified: Secondary | ICD-10-CM

## 2016-07-01 DIAGNOSIS — R9431 Abnormal electrocardiogram [ECG] [EKG]: Secondary | ICD-10-CM | POA: Diagnosis not present

## 2016-07-01 DIAGNOSIS — M549 Dorsalgia, unspecified: Secondary | ICD-10-CM | POA: Diagnosis present

## 2016-07-01 DIAGNOSIS — E871 Hypo-osmolality and hyponatremia: Secondary | ICD-10-CM | POA: Diagnosis not present

## 2016-07-01 DIAGNOSIS — M542 Cervicalgia: Secondary | ICD-10-CM | POA: Diagnosis not present

## 2016-07-01 DIAGNOSIS — R112 Nausea with vomiting, unspecified: Secondary | ICD-10-CM | POA: Diagnosis not present

## 2016-07-01 DIAGNOSIS — J449 Chronic obstructive pulmonary disease, unspecified: Secondary | ICD-10-CM | POA: Diagnosis present

## 2016-07-01 DIAGNOSIS — F17210 Nicotine dependence, cigarettes, uncomplicated: Secondary | ICD-10-CM | POA: Diagnosis present

## 2016-07-01 DIAGNOSIS — Z96653 Presence of artificial knee joint, bilateral: Secondary | ICD-10-CM | POA: Diagnosis present

## 2016-07-01 DIAGNOSIS — R109 Unspecified abdominal pain: Secondary | ICD-10-CM

## 2016-07-01 DIAGNOSIS — E861 Hypovolemia: Secondary | ICD-10-CM | POA: Diagnosis present

## 2016-07-01 DIAGNOSIS — N189 Chronic kidney disease, unspecified: Secondary | ICD-10-CM | POA: Clinically undetermined

## 2016-07-01 DIAGNOSIS — E860 Dehydration: Secondary | ICD-10-CM | POA: Diagnosis present

## 2016-07-01 DIAGNOSIS — F329 Major depressive disorder, single episode, unspecified: Secondary | ICD-10-CM | POA: Diagnosis present

## 2016-07-01 DIAGNOSIS — E038 Other specified hypothyroidism: Secondary | ICD-10-CM | POA: Diagnosis present

## 2016-07-01 DIAGNOSIS — G894 Chronic pain syndrome: Secondary | ICD-10-CM | POA: Diagnosis present

## 2016-07-01 DIAGNOSIS — K219 Gastro-esophageal reflux disease without esophagitis: Secondary | ICD-10-CM | POA: Diagnosis present

## 2016-07-01 DIAGNOSIS — N179 Acute kidney failure, unspecified: Secondary | ICD-10-CM | POA: Diagnosis not present

## 2016-07-01 DIAGNOSIS — R001 Bradycardia, unspecified: Secondary | ICD-10-CM | POA: Diagnosis present

## 2016-07-01 DIAGNOSIS — I6529 Occlusion and stenosis of unspecified carotid artery: Secondary | ICD-10-CM

## 2016-07-01 DIAGNOSIS — R410 Disorientation, unspecified: Secondary | ICD-10-CM | POA: Diagnosis not present

## 2016-07-01 DIAGNOSIS — Z79899 Other long term (current) drug therapy: Secondary | ICD-10-CM

## 2016-07-01 DIAGNOSIS — I5030 Unspecified diastolic (congestive) heart failure: Secondary | ICD-10-CM | POA: Diagnosis present

## 2016-07-01 DIAGNOSIS — E785 Hyperlipidemia, unspecified: Secondary | ICD-10-CM | POA: Diagnosis present

## 2016-07-01 DIAGNOSIS — Z8249 Family history of ischemic heart disease and other diseases of the circulatory system: Secondary | ICD-10-CM

## 2016-07-01 DIAGNOSIS — H409 Unspecified glaucoma: Secondary | ICD-10-CM | POA: Diagnosis present

## 2016-07-01 DIAGNOSIS — I10 Essential (primary) hypertension: Secondary | ICD-10-CM | POA: Diagnosis present

## 2016-07-01 DIAGNOSIS — G8929 Other chronic pain: Secondary | ICD-10-CM | POA: Diagnosis present

## 2016-07-01 LAB — URINALYSIS, ROUTINE W REFLEX MICROSCOPIC
Bilirubin Urine: NEGATIVE
Glucose, UA: NEGATIVE mg/dL
Ketones, ur: NEGATIVE mg/dL
NITRITE: NEGATIVE
Protein, ur: 30 mg/dL — AB
SPECIFIC GRAVITY, URINE: 1.012 (ref 1.005–1.030)
pH: 5 (ref 5.0–8.0)

## 2016-07-01 LAB — I-STAT CHEM 8, ED
BUN: 53 mg/dL — ABNORMAL HIGH (ref 6–20)
CREATININE: 2.7 mg/dL — AB (ref 0.44–1.00)
Calcium, Ion: 1.19 mmol/L (ref 1.15–1.40)
Chloride: 98 mmol/L — ABNORMAL LOW (ref 101–111)
GLUCOSE: 181 mg/dL — AB (ref 65–99)
HCT: 63 % — ABNORMAL HIGH (ref 36.0–46.0)
Hemoglobin: 21.4 g/dL (ref 12.0–15.0)
Potassium: 4.5 mmol/L (ref 3.5–5.1)
Sodium: 128 mmol/L — ABNORMAL LOW (ref 135–145)
TCO2: 22 mmol/L (ref 0–100)

## 2016-07-01 LAB — COMPREHENSIVE METABOLIC PANEL
ALT: 31 U/L (ref 14–54)
AST: 28 U/L (ref 15–41)
Albumin: 4.7 g/dL (ref 3.5–5.0)
Alkaline Phosphatase: 79 U/L (ref 38–126)
Anion gap: 16 — ABNORMAL HIGH (ref 5–15)
BILIRUBIN TOTAL: 1.2 mg/dL (ref 0.3–1.2)
BUN: 47 mg/dL — AB (ref 6–20)
CALCIUM: 10.8 mg/dL — AB (ref 8.9–10.3)
CO2: 20 mmol/L — ABNORMAL LOW (ref 22–32)
CREATININE: 2.95 mg/dL — AB (ref 0.44–1.00)
Chloride: 93 mmol/L — ABNORMAL LOW (ref 101–111)
GFR calc Af Amer: 17 mL/min — ABNORMAL LOW (ref >60)
GFR, EST NON AFRICAN AMERICAN: 15 mL/min — AB (ref >60)
Glucose, Bld: 183 mg/dL — ABNORMAL HIGH (ref 65–99)
POTASSIUM: 4.5 mmol/L (ref 3.5–5.1)
Sodium: 129 mmol/L — ABNORMAL LOW (ref 135–145)
TOTAL PROTEIN: 8.4 g/dL — AB (ref 6.5–8.1)

## 2016-07-01 LAB — CBC WITH DIFFERENTIAL/PLATELET
BASOS ABS: 0 10*3/uL (ref 0.0–0.1)
Basophils Relative: 0 %
Eosinophils Absolute: 0 10*3/uL (ref 0.0–0.7)
Eosinophils Relative: 0 %
HEMATOCRIT: 56.8 % — AB (ref 36.0–46.0)
Hemoglobin: 20.4 g/dL — ABNORMAL HIGH (ref 12.0–15.0)
LYMPHS ABS: 2.7 10*3/uL (ref 0.7–4.0)
Lymphocytes Relative: 20 %
MCH: 30.1 pg (ref 26.0–34.0)
MCHC: 35.9 g/dL (ref 30.0–36.0)
MCV: 83.9 fL (ref 78.0–100.0)
MONO ABS: 0.7 10*3/uL (ref 0.1–1.0)
Monocytes Relative: 6 %
NEUTROS ABS: 10 10*3/uL — AB (ref 1.7–7.7)
Neutrophils Relative %: 75 %
Platelets: 328 10*3/uL (ref 150–400)
RBC: 6.77 MIL/uL — ABNORMAL HIGH (ref 3.87–5.11)
RDW: 13.8 % (ref 11.5–15.5)
WBC: 13.4 10*3/uL — ABNORMAL HIGH (ref 4.0–10.5)

## 2016-07-01 LAB — CBG MONITORING, ED: Glucose-Capillary: 134 mg/dL — ABNORMAL HIGH (ref 65–99)

## 2016-07-01 LAB — LIPASE, BLOOD: Lipase: 41 U/L (ref 11–51)

## 2016-07-01 LAB — I-STAT CG4 LACTIC ACID, ED
Lactic Acid, Venous: 1.04 mmol/L (ref 0.5–1.9)
Lactic Acid, Venous: 0.76 mmol/L (ref 0.5–1.9)

## 2016-07-01 LAB — TSH: TSH: 1.23 u[IU]/mL (ref 0.350–4.500)

## 2016-07-01 LAB — PROTIME-INR
INR: 1.04
PROTHROMBIN TIME: 13.6 s (ref 11.4–15.2)

## 2016-07-01 LAB — APTT: APTT: 28 s (ref 24–36)

## 2016-07-01 LAB — TROPONIN I
TROPONIN I: 0.06 ng/mL — AB (ref <0.03)
Troponin I: 0.07 ng/mL (ref <0.03)

## 2016-07-01 LAB — MAGNESIUM: Magnesium: 1.8 mg/dL (ref 1.7–2.4)

## 2016-07-01 LAB — DIGOXIN LEVEL: DIGOXIN LVL: 1.5 ng/mL (ref 0.8–2.0)

## 2016-07-01 LAB — VITAMIN B12: VITAMIN B 12: 674 pg/mL (ref 180–914)

## 2016-07-01 LAB — GLUCOSE, CAPILLARY: Glucose-Capillary: 165 mg/dL — ABNORMAL HIGH (ref 65–99)

## 2016-07-01 LAB — PHOSPHORUS: PHOSPHORUS: 5.7 mg/dL — AB (ref 2.5–4.6)

## 2016-07-01 LAB — BRAIN NATRIURETIC PEPTIDE: B NATRIURETIC PEPTIDE 5: 173.1 pg/mL — AB (ref 0.0–100.0)

## 2016-07-01 MED ORDER — ENOXAPARIN SODIUM 30 MG/0.3ML ~~LOC~~ SOLN
30.0000 mg | SUBCUTANEOUS | Status: DC
  Administered 2016-07-01 – 2016-07-02 (×2): 30 mg via SUBCUTANEOUS
  Filled 2016-07-01 (×3): qty 0.3

## 2016-07-01 MED ORDER — SODIUM CHLORIDE 0.9 % IV BOLUS (SEPSIS)
1000.0000 mL | Freq: Once | INTRAVENOUS | Status: AC
  Administered 2016-07-01: 1000 mL via INTRAVENOUS

## 2016-07-01 MED ORDER — MORPHINE SULFATE (PF) 4 MG/ML IV SOLN
1.0000 mg | INTRAVENOUS | Status: DC | PRN
  Administered 2016-07-01 – 2016-07-02 (×2): 1 mg via INTRAVENOUS
  Filled 2016-07-01 (×2): qty 1

## 2016-07-01 MED ORDER — DIGOXIN 125 MCG PO TABS
250.0000 ug | ORAL_TABLET | Freq: Every morning | ORAL | Status: DC
Start: 2016-07-02 — End: 2016-07-02
  Filled 2016-07-01 (×2): qty 2

## 2016-07-01 MED ORDER — MORPHINE SULFATE (PF) 4 MG/ML IV SOLN
2.0000 mg | Freq: Once | INTRAVENOUS | Status: AC
  Administered 2016-07-01: 2 mg via INTRAVENOUS
  Filled 2016-07-01: qty 1

## 2016-07-01 MED ORDER — INSULIN ASPART 100 UNIT/ML ~~LOC~~ SOLN: 0.0000 [IU] | Freq: Every day | SUBCUTANEOUS | Status: DC

## 2016-07-01 MED ORDER — INSULIN ASPART 100 UNIT/ML ~~LOC~~ SOLN
0.0000 [IU] | Freq: Three times a day (TID) | SUBCUTANEOUS | Status: DC
  Administered 2016-07-01 – 2016-07-02 (×3): 1 [IU] via SUBCUTANEOUS
  Filled 2016-07-01: qty 1

## 2016-07-01 MED ORDER — ACETAMINOPHEN 650 MG RE SUPP: 650.0000 mg | Freq: Four times a day (QID) | RECTAL | Status: DC | PRN

## 2016-07-01 MED ORDER — HYDRALAZINE HCL 20 MG/ML IJ SOLN: 10.0000 mg | Freq: Three times a day (TID) | INTRAMUSCULAR | Status: DC | PRN

## 2016-07-01 MED ORDER — DOCUSATE SODIUM 100 MG PO CAPS
100.0000 mg | ORAL_CAPSULE | Freq: Two times a day (BID) | ORAL | Status: DC
  Administered 2016-07-01 – 2016-07-03 (×4): 100 mg via ORAL
  Filled 2016-07-01 (×4): qty 1

## 2016-07-01 MED ORDER — OXYCODONE-ACETAMINOPHEN 5-325 MG PO TABS
1.0000 | ORAL_TABLET | ORAL | Status: DC | PRN
  Administered 2016-07-01: 1 via ORAL
  Administered 2016-07-02: 2 via ORAL
  Administered 2016-07-02: 1 via ORAL
  Administered 2016-07-02: 2 via ORAL
  Filled 2016-07-01: qty 1
  Filled 2016-07-01 (×2): qty 2
  Filled 2016-07-01: qty 1
  Filled 2016-07-01 (×2): qty 2

## 2016-07-01 MED ORDER — ONDANSETRON HCL 4 MG/2ML IJ SOLN
4.0000 mg | Freq: Once | INTRAMUSCULAR | Status: AC
  Administered 2016-07-01: 4 mg via INTRAVENOUS
  Filled 2016-07-01: qty 2

## 2016-07-01 MED ORDER — ONDANSETRON HCL 4 MG/2ML IJ SOLN
4.0000 mg | Freq: Four times a day (QID) | INTRAMUSCULAR | Status: DC | PRN
  Administered 2016-07-02: 4 mg via INTRAVENOUS
  Filled 2016-07-01: qty 2

## 2016-07-01 MED ORDER — IOPAMIDOL (ISOVUE-300) INJECTION 61%
INTRAVENOUS | Status: AC
  Filled 2016-07-01: qty 100

## 2016-07-01 MED ORDER — ONDANSETRON HCL 4 MG/2ML IJ SOLN
4.0000 mg | Freq: Four times a day (QID) | INTRAMUSCULAR | Status: AC
  Administered 2016-07-02: 4 mg via INTRAVENOUS
  Filled 2016-07-01: qty 2

## 2016-07-01 MED ORDER — ACETAMINOPHEN 325 MG PO TABS
650.0000 mg | ORAL_TABLET | Freq: Four times a day (QID) | ORAL | Status: DC | PRN
  Administered 2016-07-01 – 2016-07-02 (×3): 650 mg via ORAL
  Filled 2016-07-01 (×3): qty 2

## 2016-07-01 MED ORDER — LEVOTHYROXINE SODIUM 75 MCG PO TABS
75.0000 ug | ORAL_TABLET | Freq: Every day | ORAL | Status: DC
  Administered 2016-07-02 – 2016-07-03 (×2): 75 ug via ORAL
  Filled 2016-07-01 (×3): qty 1

## 2016-07-01 MED ORDER — ONDANSETRON HCL 4 MG PO TABS: 4.0000 mg | ORAL_TABLET | Freq: Four times a day (QID) | ORAL | Status: DC | PRN

## 2016-07-01 MED ORDER — SODIUM CHLORIDE 0.9 % IV SOLN
INTRAVENOUS | Status: AC
  Administered 2016-07-01 – 2016-07-02 (×2): via INTRAVENOUS

## 2016-07-01 NOTE — ED Notes (Signed)
Ordered clear liquid tray 

## 2016-07-01 NOTE — H&P (Signed)
History and Physical    Destiny Hughes CHY:[id number] DOB: [birth year] DOA: 07/01/2016  PCP: Minette Brine Patient coming from: home/neurology office  Chief Complaint: ams/hypotension  HPI: Destiny Hughes is a 71 y.o. female with medical history significant hypertension, COPD not on home oxygen, hyperlipidemia, hypothyroidism, chronic pain on methadone, depression presents to the emergency department from neurologists office with the chief complaint of hypotension.  Information is obtained from the patient's husband and son who are at the bedside is information from the patient is unreliable due to memory issues. Husband reports that in March patient had cataract surgery and since that time she has experienced progressively increasing confusion. Associated symptoms include nausea vomiting decreased oral intake weight loss lethargy. She saw a neurologist last  month. Head CT of the head last month as well as was unremarkable. He was seen in neurology office today for follow-up visit. Allen that visit she was found to have hypotension likely related to dehydration secondary to intractable nausea and vomiting. Patient denies chest pain palpitation shortness of breath. She denies abdominal pain right now and requesting food as she is hungry. She denies dysuria hematuria frequency or urgency. She denies diarrhea constipation melena bright were blood per rectum. She denies lower extremity edema orthopnea.   ED Course: in the emergency department she is hypotensive with a blood pressure 78/54 heart rate in the 50s. She is afebrile. She is provided with 1 L normal saline. At the time of admission her blood pressures 106/71 heart rate is 60. She is nontoxic appearing. She is not hypoxic  Review of Systems: As per HPI otherwise 10 point review of systems negative.   Ambulatory Status: Ambulates independently. No recent falls.  Past Medical History:  Diagnosis Date  . Arthritis 01-25-11   back and  most joints  . Blood transfusion    after Thoracic surgery ?'04  . Chronic back pain   . Chronic neck pain   . Collapse of right lung   . COPD (chronic obstructive pulmonary disease) (HCC)    Hx. Bronchitis occ, 01-25-11 somes issue now taking  Z-pak-started 01-24-11/Scar tissue  present  from previous lung collapse  . Depression   . Dyspnea   . Dysrhythmia    hx.PAT- tx. Inderal  . Emphysema of lung ([place])   . GERD (gastroesophageal reflux disease)    tx. TUMS  . Glaucoma   . Headache(784.0) 01-25-11   neck and nerve pain related.  Marland Kitchen Heart murmur   . Hyperlipemia   . Hypothyroidism    tx. Levothyroxine  . Nephrolithiasis   . Neuromuscular disorder ([place]) 01-25-11   Pain/nerve stimulator implanted -2 yrs ago.  . Palpitation   . Tachycardia     Past Surgical History:  Procedure Laterality Date  . APPENDECTOMY  01-25-11  . CARPAL TUNNEL RELEASE  01-25-11   Bil.  . CERVICAL SPINE SURGERY  01-25-11   2005-multiple levels  . CHOLECYSTECTOMY  01-25-11   '07  . COLONOSCOPY WITH PROPOFOL N/A 07/23/2012   Procedure: COLONOSCOPY WITH PROPOFOL;  Surgeon: Garlan Fair, MD;  Location: [facility];  Service: Endoscopy;  Laterality: N/A;  . EYE SURGERY    . JOINT REPLACEMENT  01-25-11   6'03 LTHA-hemi  . KNEE ARTHROPLASTY  01-25-11   '04-right, revised x2  . SPINAL CORD STIMULATOR IMPLANT  2009 APPROX   DR. ELSNER  . THORACIC SPINE SURGERY  01-25-11   6'02- then nerve stimulator implanted after  . TOTAL KNEE REVISION  02/01/2011  Procedure: TOTAL KNEE REVISION;  Surgeon: Mauri Pole;  Location: [facility];  Service: Orthopedics;  Laterality: Right;  . TUBAL LIGATION      Social History   Social History  . Marital status: Married    Spouse name: N/A  . Number of children: 2  . Years of education: N/A   Occupational History  . disabled    Social History Main Topics  . Smoking status: Current Every Day Smoker    Packs/day: 0.75    Years: 50.00    Types: Cigarettes    Start  date: 09/13/1961  . Smokeless tobacco: Never Used     Comment: Peak rate of 1ppd  . Alcohol use No     Comment: none in 10 years  . Drug use: No  . Sexual activity: No   Other Topics Concern  . Not on file   Social History Narrative   She is from [facility]. She has always lived in [place]. She has traveled to [place], [place], [place], [place], & [place]. 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 Prior exposure to parakeets with last exposure remote in her current home. Previously had mold in her home that was in her bathroom & fixed. Prior exposure to hot tubs but none recently. Pt lives at home with [name], husband.  Has 2 childrean, 12th grade education.      Allergies  Allergen Reactions  . Albuterol Other (See Comments)    Had difficulty breathing after a nebulizer treatment  . Penicillins Anaphylaxis and Swelling    Has patient had a PCN reaction causing immediate rash, facial/tongue/throat swelling, SOB or lightheadedness with hypotension: Yes Has patient had a PCN reaction causing severe rash involving mucus membranes or skin necrosis: Rash Has patient had a PCN reaction that required hospitalization: No Has patient had a PCN reaction occurring within the last 10 years: No If all of the above answers are "NO", then may proceed with Cephalosporin use.  [name] [Kdc:Albuterol] Anaphylaxis  . Sulfa Antibiotics Swelling    Throat swells, but no shortness of breath noted  . Zanaflex [Tizanidine] Other (See Comments)    Possible confusion (??)  . Codeine Nausea And Vomiting  . Ecotrin [Aspirin] Nausea And Vomiting    Family History  Problem Relation Age of Onset  . Heart attack Father   . Emphysema Father   . Aneurysm Mother     CEREBRAL  . Emphysema Mother   . Dementia Mother   . Diabetes Son   . Hypertension Son   . Hypertension Son   . Cancer Sister     Widely Metastatic  .  Asthma Son     x2  . Breast cancer Paternal Aunt   . Rheum arthritis Maternal Aunt     Prior to Admission medications   Medication Sig Start Date End Date Taking? Authorizing Provider  digoxin (DIGOX) 0.25 MG tablet Take 1 tablet (250 mcg total) by mouth every morning. 01/22/16  Yes Skeet Latch, MD  furosemide (LASIX) 20 MG tablet Take 1 tablet (20 mg total) by mouth daily. 02/22/16  Yes Skeet Latch, MD  levothyroxine (SYNTHROID, LEVOTHROID) 150 MCG tablet TAKE 1 TO 1+1/2 TABLETS DAILY AS DIRECTED Patient taking differently: Take 150 mcg by mouth in the morning before breakfast on Sun/Mon/Wed/Fri/Sat and 225 mcg on Tues/Thurs 05/14/16  Yes Unk Pinto, MD  lisinopril (PRINIVIL,ZESTRIL) 10 MG  tablet TAKE 1 TABLET(10 MG) BY MOUTH DAILY 03/23/16  Yes Skeet Latch, MD  Multiple Vitamins-Minerals (ALIVE WOMENS 50+) TABS Take 1 tablet by mouth daily.   Yes Historical Provider, MD  propranolol (INDERAL) 80 MG tablet Take 1 tablet (80 mg total) by mouth 2 (two) times daily. 01/22/16  Yes Skeet Latch, MD  methadone (DOLOPHINE) 5 MG tablet Take 5 mg by mouth every 6 (six) hours.     Historical Provider, MD  oxyCODONE (ROXICODONE) 15 MG immediate release tablet Take 1 tablet by mouth every 6 (six) hours as needed for pain.  07/31/15   Historical Provider, MD  sertraline (ZOLOFT) 100 MG tablet Take 100 mg by mouth at bedtime.     Historical Provider, MD    Physical Exam: Vitals:   07/01/16 1500 07/01/16 1600 07/01/16 1646 07/01/16 1652  BP: (!) 116/56 106/71  123/64  Pulse: (!) 58 60  60  Resp: 16 16  18   Temp:   98.1 F (36.7 C)   TempSrc:   Oral   SpO2: 94% 93%  96%     General:  Appears calm and comfortableNo acute distress  Eyes:  PERRL, EOMI, normal lids, iris ENT:  grossly normal hearing, lips & tongue, mucous membranes of her mouth are pink slightly dry  Neck:  no LAD, masses or thyromegaly Cardiovascular:  RRR, no m/r/g. No LE edema.  Respiratory:  CTA bilaterally,  no w/r/r. Normal respiratory effort. Abdomen:  soft, ntnd, positive bowel sounds no guarding or rebounding  Skin:  no rash or induration seen on limited exam Musculoskeletal:  grossly normal tone BUE/BLE, good ROM, no bony abnormality Psychiatric:  grossly normal mood and affect, speech fluent and appropriate, AOx3 Neurologic:  CN 2-12 grossly intact, moves all extremities in coordinated fashion, sensation intact speech clear facial symmetry bilateral grip 5 out of 5 lower extremity strength 5 out of 5 bilaterally oriented to self and place only. No pronator drift  Labs on Admission: I have personally reviewed following labs and imaging studies  CBC:  Recent Labs Lab 07/01/16 1330 07/01/16 1347  WBC 13.4*  --   NEUTROABS 10.0*  --   HGB 20.4* 21.4*  HCT 56.8* 63.0*  MCV 83.9  --   PLT 328  --    Basic Metabolic Panel:  Recent Labs Lab 07/01/16 1330 07/01/16 1347  NA 129* 128*  K 4.5 4.5  CL 93* 98*  CO2 20*  --   GLUCOSE 183* 181*  BUN 47* 53*  CREATININE 2.95* 2.70*  CALCIUM 10.8*  --    GFR: Estimated Creatinine Clearance: 17.6 mL/min (A) (by C-G formula based on SCr of 2.7 mg/dL (H)). Liver Function Tests:  Recent Labs Lab 07/01/16 1330  AST 28  ALT 31  ALKPHOS 79  BILITOT 1.2  PROT 8.4*  ALBUMIN 4.7    Recent Labs Lab 07/01/16 1330  LIPASE 41   No results for input(s): AMMONIA in the last 168 hours. Coagulation Profile: No results for input(s): INR, PROTIME in the last 168 hours. Cardiac Enzymes: No results for input(s): CKTOTAL, CKMB, CKMBINDEX, TROPONINI in the last 168 hours. BNP (last 3 results) No results for input(s): PROBNP in the last 8760 hours. HbA1C: No results for input(s): HGBA1C in the last 72 hours. CBG: No results for input(s): GLUCAP in the last 168 hours. Lipid Profile: No results for input(s): CHOL, HDL, LDLCALC, TRIG, CHOLHDL, LDLDIRECT in the last 72 hours. Thyroid Function Tests: No results for input(s): TSH, T4TOTAL,  FREET4, T3FREE,  THYROIDAB in the last 72 hours. Anemia Panel: No results for input(s): VITAMINB12, FOLATE, FERRITIN, TIBC, IRON, RETICCTPCT in the last 72 hours. Urine analysis:    Component Value Date/Time   COLORURINE YELLOW 06/02/2016 1045   APPEARANCEUR CLEAR 06/02/2016 1045   LABSPEC 1.006 06/02/2016 1045   PHURINE 7.0 06/02/2016 1045   GLUCOSEU NEGATIVE 06/02/2016 1045   HGBUR NEGATIVE 06/02/2016 [place] 06/02/2016 [place] 06/02/2016 1045   PROTEINUR NEGATIVE 06/02/2016 1045   UROBILINOGEN 0.2 02/17/2014 1026   NITRITE NEGATIVE 06/02/2016 1045   LEUKOCYTESUR NEGATIVE 06/02/2016 1045    Creatinine Clearance: Estimated Creatinine Clearance: 17.6 mL/min (A) (by C-G formula based on SCr of 2.7 mg/dL (H)).  Sepsis Labs: @LABRCNTIP (procalcitonin:4,lacticidven:4) )No results found for this or any previous visit (from the past 240 hour(s)).   Radiological Exams on Admission: Ct Abdomen Pelvis Wo Contrast  Result Date: 07/01/2016 CLINICAL DATA:  Diffuse pain all over. Nausea. Weight loss. Difficulty eating. EXAM: CT ABDOMEN AND PELVIS WITHOUT CONTRAST TECHNIQUE: Multidetector CT imaging of the abdomen and pelvis was performed following the standard protocol without IV contrast. COMPARISON:  CT of the abdomen and pelvis 06/02/2016 FINDINGS: Lower chest: The lung bases are mildly degraded by patient motion. No focal nodule, mass, or airspace disease is present. Hepatobiliary: No focal liver abnormality is seen. Status post cholecystectomy. No biliary dilatation. Pancreas: Unremarkable. No pancreatic ductal dilatation or surrounding inflammatory changes. Spleen: Normal in size without focal abnormality. Adrenals/Urinary Tract: Adrenal glands are normal bilaterally. Kidneys and ureters are within normal limits. There is no stone or hydronephrosis. The urinary bladder is within normal limits. Stomach/Bowel: The stomach and duodenum are within normal limits.  Small bowel is unremarkable. Appendix is visualized and normal. The ascending and transverse colon are within normal limits. The descending and sigmoid colon are unremarkable. Vascular/Lymphatic: Aortic atherosclerosis. No enlarged abdominal or pelvic lymph nodes. Reproductive: Uterus and bilateral adnexa are unremarkable. Other: No abdominal wall hernia or abnormality. No abdominopelvic ascites. Musculoskeletal: Multilevel facet degenerative changes are present in the lumbar spine. These are most pronounced at L2-3 and L3-4 with slight anterolisthesis is present. Vertebral body heights alignment contained. There quickly blastic lesions. The bony pelvis is intact. Left total hip arthroplasty is noted. IMPRESSION: 1. No acute or focal lesion to explain the patient's diffuse abdominal pain. 2. Cholecystectomy. 3. Atherosclerosis without aneurysm. 4. Mild degenerative changes of the lumbar spine are likely within normal limits for age. Electronically Signed   By: San Morelle M.D.   On: 07/01/2016 15:33   Portable Chest 1 View  Result Date: 07/01/2016 CLINICAL DATA:  Encephalopathy EXAM: PORTABLE CHEST 1 VIEW COMPARISON:  Portable exam 1620 hours compared to 10/13/2015 FINDINGS: Intraspinal stimulator projects over the upper thoracic spine at T3-T5. Prior cervical fusion. Normal heart size, mediastinal contours, and pulmonary vascularity. Lungs emphysematous but clear. No pulmonary infiltrate, pleural effusion or pneumothorax. Bones demineralized. IMPRESSION: Probable COPD changes without acute infiltrate. Electronically Signed   By: Lavonia Dana M.D.   On: 07/01/2016 16:28    EKG: Pacemaker spikes or artifacts Sinus rhythm Prolonged PR interval Consider left atrial enlargement Inferior infarct, old Posterior infarct, acute (LCx) Probable anterior infarct, age indeterminate  Assessment/Plan Principal Problem:   Acute kidney injury ([place]) Active Problems:   Chronic neck pain   Essential  hypertension   COPD (chronic obstructive pulmonary disease) (HCC)   Hypothyroidism   GERD (gastroesophageal reflux disease)   Diastolic CHF ([place])   Hyperglycemia   Hyponatremia  Intractable nausea and vomiting   Abnormal EKG   #1. Acute kidney injury in the setting of chronic kidney disease likely related to dehydration in the setting of intractable nausea vomiting, Lasix, lisinopril, hypertension. Creatinine 2.95 on admission. Chart review indicates 4 weeks ago 1.01. -Admit -Gentle IV fluids -Hold nephrotoxins -Monitor urine output -If no improvement consider a renal ultrasound -Recheck in the morning  #2. Intractable nausea and vomiting. Problem a somewhat of a chronic issue worsening in the last couple of days. May be related to withdrawal of methadone and OxyIR. Recent CT of the abdomen unremarkable. Lipase within the limits of normal. -Scheduled Zofran -Clear liquid diet -bowel rest -advance diet as tolerated  #3. Hypertension. Patient hypotensive on admission. Home medications include digoxin, Lasix, lisinopril, propranolol. She received 1 L of normal saline in the emergency department. Blood pressure improved on admission. She is afebrile. WBC 13.4. Lactic acid acid within the limits of normal -Continue IV fluids -Hold antihypertensives -check orthostatics tomorrow -Obtain chest x-ray and urinalysis  4. Acute and persistent encephalopathy. Etiology unclear. Started after recent cararact surgery.  Likely related to all the above as well as possible infectious process and/or polypharmacy in setting of mild metabolic derangement. CT head 1 month ago unremarkable. Neuro exam benign -Pain chest x-ray and urinalysis rule out infectious process -Continue gentle IV fluids -Improve electrolytes -Hold altering medications -Repeat head CT -Obtain a folate and B-12 RPR  #5. Hyperglycemia. Chart review indicates history of "prediabetes" serum glucose 181 on admission. -Obtain a  hemoglobin A1c -Sliding scale for optimal control  #6. Hyponatremia. Likely related to dehydration from persistent nausea and vomiting -IV fluids as noted above -Recheck in the morning  #7. Diastolic heart failure. Currently compensated. Echo in June 2017 review an EF of 60% and grade 2 diastolic dysfunction -Holding Lasix and beta blocker for now secondary to above -Monitor intake and output -Obtain daily weights  #8. Abnormal EKG. EKG as above. Troponin pending. ED MD discussed ekg with Gangi on call MD for STEMI who opined not STEMI and recommended cards consult. ED MD contacted cards. No chest pain.  -cycle troponin -serial ekg -appreciate dr Thomasene Lot assistance   DVT prophylaxis: scd  Code Status: full  Family Communication: husband at bedside  Disposition Plan: home  Consults called: none  Admission status: obs    Radene Gunning MD [facility]  If 7PM-7AM, please contact night-coverage [email or web address] Password [facility]  07/01/2016, 5:37 PM

## 2016-07-01 NOTE — ED Triage Notes (Signed)
PT sent here from neurologist for hypotension.  Pt was initially there to see him for confusion x 1 month that began 3 days after a cataract surgery.  Pt is difficult to arouse.  C/o R sided abdominal pain, emesis.

## 2016-07-01 NOTE — ED Notes (Signed)
CBG 134 

## 2016-07-01 NOTE — ED Notes (Signed)
Patient transported to CT 

## 2016-07-01 NOTE — ED Provider Notes (Signed)
[facility] DEPT Provider Note   CSN: [id number] Arrival date & time: 07/01/16  1246     History   Chief Complaint Chief Complaint  Patient presents with  . Hypotension  . Abdominal Pain    HPI Destiny Hughes is a 71 y.o. female.  71 yo F with a chief complaint of diffuse abdominal pain. This been going on for at least 3 months likely longer. Patient has been having trouble eating is been having a dysguesia. She denies fevers or chills. Denies vomiting. Denies dysuria or flank pain. Nothing seems to make this pain better or worse. She has been not really eating or drinking for the past week or longer. Went to her neuro and was noted to be very hypertensive today.   The history is provided by the patient.  Abdominal Pain   This is a new problem. The current episode started more than 1 week ago (3 months or longer). The problem occurs constantly. The problem has not changed since onset.The pain is located in the generalized abdominal region. The quality of the pain is aching. The pain is at a severity of 10/10. The pain is severe. Associated symptoms include nausea. Pertinent negatives include fever, vomiting, dysuria, headaches, arthralgias and myalgias. Nothing aggravates the symptoms. Nothing relieves the symptoms. Past workup includes CT scan.    Past Medical History:  Diagnosis Date  . Arthritis 01-25-11   back and most joints  . Blood transfusion    after Thoracic surgery ?'04  . Chronic back pain   . Chronic neck pain   . Collapse of right lung   . COPD (chronic obstructive pulmonary disease) (HCC)    Hx. Bronchitis occ, 01-25-11 somes issue now taking  Z-pak-started 01-24-11/Scar tissue  present  from previous lung collapse  . Depression   . Dyspnea   . Dysrhythmia    hx.PAT- tx. Inderal  . Emphysema of lung ([place])   . GERD (gastroesophageal reflux disease)    tx. TUMS  . Glaucoma   . Headache(784.0) 01-25-11   neck and nerve pain related.  Marland Kitchen Heart murmur   .  Hyperlipemia   . Hypothyroidism    tx. Levothyroxine  . Nephrolithiasis   . Neuromuscular disorder ([place]) 01-25-11   Pain/nerve stimulator implanted -2 yrs ago.  . Palpitation   . Tachycardia     Patient Active Problem List   Diagnosis Date Noted  . Acute kidney injury ([place]) 07/01/2016  . Hyperglycemia 07/01/2016  . Hyponatremia 07/01/2016  . Intractable nausea and vomiting 07/01/2016  . Abnormal EKG 07/01/2016  . Diastolic CHF ([place]) 52/77/8242  . Medication management 04/28/2015  . Emphysema lung ([place]) 12/26/2014  . GERD (gastroesophageal reflux disease) 12/26/2014  . Chronic bronchitis ([place]) 11/13/2014  . COPD (chronic obstructive pulmonary disease) ([place]) 10/03/2014  . At high risk for falls 10/03/2014  . Hypothyroidism 10/03/2014  . Mitral valve prolapse 10/03/2014  . Encounter for Medicare annual wellness exam 10/03/2014  . IBS (irritable bowel syndrome) 04/01/2014  . H/O total knee replacement 04/22/2013  . Essential hypertension 04/15/2013  . Vitamin D deficiency 04/15/2013  . Encounter for long-term (current) use of medications 04/15/2013  . Prediabetes 04/15/2013  . Osteoarthritis 01/31/2011  . Hyperlipemia   . Glaucoma   . Chronic neck pain   . Chronic back pain     Past Surgical History:  Procedure Laterality Date  . APPENDECTOMY  01-25-11  . CARPAL TUNNEL RELEASE  01-25-11   Bil.  . CERVICAL SPINE SURGERY  01-25-11  2005-multiple levels  . CHOLECYSTECTOMY  01-25-11   '07  . COLONOSCOPY WITH PROPOFOL N/A 07/23/2012   Procedure: COLONOSCOPY WITH PROPOFOL;  Surgeon: Garlan Fair, MD;  Location: [facility] END[facility]COPY;  Service: Endoscopy;  Laterality: N/A;  . EYE SURGERY    . JOINT REPLACEMENT  01-25-11   6'03 LTHA-hemi  . KNEE ARTHROPLASTY  01-25-11   '04-right, revised x2  . SPINAL CORD STIMULATOR IMPLANT  2009 APPROX   DR. ELSNER  . THORACIC SPINE SURGERY  01-25-11   6'02- then nerve stimulator implanted after  . TOTAL KNEE REVISION  02/01/2011   Procedure:  TOTAL KNEE REVISION;  Surgeon: Mauri Pole;  Location: [facility] OR[facility];  Service: Orthopedics;  Laterality: Right;  . TUBAL LIGATION      OB History    No data available       Home Medications    Prior to Admission medications   Medication Sig Start Date End Date Taking? Authorizing Provider  digoxin (DIGOX) 0.25 MG tablet Take 1 tablet (250 mcg total) by mouth every morning. 01/22/16  Yes Skeet Latch, MD  furosemide (LASIX) 20 MG tablet Take 1 tablet (20 mg total) by mouth daily. 02/22/16  Yes Skeet Latch, MD  levothyroxine (SYNTHROID, LEVOTHROID) 150 MCG tablet TAKE 1 TO 1+1/2 TABLETS DAILY AS DIRECTED Patient taking differently: Take 150 mcg by mouth in the morning before breakfast on Sun/Mon/Wed/Fri/Sat and 225 mcg on Tues/Thurs 05/14/16  Yes Unk Pinto, MD  lisinopril (PRINIVIL,ZESTRIL) 10 MG tablet TAKE 1 TABLET(10 MG) BY MOUTH DAILY 03/23/16  Yes Skeet Latch, MD  Multiple Vitamins-Minerals (ALIVE WOMENS 50+) TABS Take 1 tablet by mouth daily.   Yes Historical Provider, MD  propranolol (INDERAL) 80 MG tablet Take 1 tablet (80 mg total) by mouth 2 (two) times daily. 01/22/16  Yes Skeet Latch, MD  methadone (DOLOPHINE) 5 MG tablet Take 5 mg by mouth every 6 (six) hours.     Historical Provider, MD  oxyCODONE (ROXICODONE) 15 MG immediate release tablet Take 1 tablet by mouth every 6 (six) hours as needed for pain.  07/31/15   Historical Provider, MD  sertraline (ZOLOFT) 100 MG tablet Take 100 mg by mouth at bedtime.     Historical Provider, MD    Family History Family History  Problem Relation Age of Onset  . Heart attack Father   . Emphysema Father   . Aneurysm Mother     CEREBRAL  . Emphysema Mother   . Dementia Mother   . Diabetes Son   . Hypertension Son   . Hypertension Son   . Cancer Sister     Widely Metastatic  . Asthma Son     x2  . Breast cancer Paternal Aunt   . Rheum arthritis Maternal Aunt     Social History Social History  Substance Use  Topics  . Smoking status: Current Every Day Smoker    Packs/day: 0.75    Years: 50.00    Types: Cigarettes    Start date: 09/13/1961  . Smokeless tobacco: Never Used     Comment: Peak rate of 1ppd  . Alcohol use No     Comment: none in 10 years     Allergies   Albuterol; Penicillins; Ventolin [kdc:albuterol]; Sulfa antibiotics; Zanaflex [tizanidine]; Codeine; and Ecotrin [aspirin]   Review of Systems Review of Systems  Constitutional: Negative for chills and fever.  HENT: Negative for congestion and rhinorrhea.   Eyes: Negative for redness and visual disturbance.  Respiratory: Negative for shortness of breath  and wheezing.   Cardiovascular: Negative for chest pain and palpitations.  Gastrointestinal: Positive for abdominal pain and nausea. Negative for rectal pain and vomiting.  Genitourinary: Negative for dysuria and urgency.  Musculoskeletal: Negative for arthralgias and myalgias.  Skin: Negative for pallor and wound.  Neurological: Negative for dizziness and headaches.     Physical Exam Updated Vital Signs BP 127/64   Pulse (!) 59   Temp 98.1 F (36.7 C) (Oral)   Resp 17   SpO2 93%   Physical Exam  Constitutional: She is oriented to person, place, and time. She appears well-developed and well-nourished. No distress.  HENT:  Head: Normocephalic and atraumatic.  Eyes: EOM are normal. Pupils are equal, round, and reactive to light.  Neck: Normal range of motion. Neck supple.  Cardiovascular: Normal rate and regular rhythm.  Exam reveals no gallop and no friction rub.   No murmur heard. Pulmonary/Chest: Effort normal. She has no wheezes. She has no rales.  Abdominal: Soft. She exhibits no distension and no mass. There is tenderness (diffuse). There is no guarding.  Musculoskeletal: She exhibits no edema or tenderness.  Neurological: She is alert and oriented to person, place, and time.  Skin: Skin is warm and dry. She is not diaphoretic.  Psychiatric: She has a  normal mood and affect. Her behavior is normal.  Nursing note and vitals reviewed.    ED Treatments / Results  Labs (all labs ordered are listed, but only abnormal results are displayed) Labs Reviewed  CBC WITH DIFFERENTIAL/PLATELET - Abnormal; Notable for the following:       Result Value   WBC 13.4 (*)    RBC 6.77 (*)    Hemoglobin 20.4 (*)    HCT 56.8 (*)    Neutro Abs 10.0 (*)    All other components within normal limits  COMPREHENSIVE METABOLIC PANEL - Abnormal; Notable for the following:    Sodium 129 (*)    Chloride 93 (*)    CO2 20 (*)    Glucose, Bld 183 (*)    BUN 47 (*)    Creatinine, Ser 2.95 (*)    Calcium 10.8 (*)    Total Protein 8.4 (*)    GFR calc non Af Amer 15 (*)    GFR calc Af Amer 17 (*)    Anion gap 16 (*)    All other components within normal limits  URINALYSIS, ROUTINE W REFLEX MICROSCOPIC - Abnormal; Notable for the following:    APPearance HAZY (*)    Hgb urine dipstick LARGE (*)    Protein, ur 30 (*)    Leukocytes, UA MODERATE (*)    Bacteria, UA RARE (*)    Squamous Epithelial / LPF 6-30 (*)    Non Squamous Epithelial 0-5 (*)    All other components within normal limits  I-STAT CHEM 8, ED - Abnormal; Notable for the following:    Sodium 128 (*)    Chloride 98 (*)    BUN 53 (*)    Creatinine, Ser 2.70 (*)    Glucose, Bld 181 (*)    Hemoglobin 21.4 (*)    HCT 63.0 (*)    All other components within normal limits  CBG MONITORING, ED - Abnormal; Notable for the following:    Glucose-Capillary 134 (*)    All other components within normal limits  URINE CULTURE  LIPASE, BLOOD  TSH  FOLATE RBC  VITAMIN B12  RPR  PHOSPHORUS  MAGNESIUM  DIGOXIN LEVEL  TROPONIN I  TROPONIN  I  TROPONIN I  BASIC METABOLIC PANEL  CBC  DIGOXIN LEVEL  BRAIN NATRIURETIC PEPTIDE  PROTIME-INR  APTT  URINALYSIS, ROUTINE W REFLEX MICROSCOPIC  I-STAT CG4 LACTIC ACID, ED  I-STAT CG4 LACTIC ACID, ED    EKG  EKG Interpretation None        Radiology Ct Abdomen Pelvis Wo Contrast  Result Date: 07/01/2016 CLINICAL DATA:  Diffuse pain all over. Nausea. Weight loss. Difficulty eating. EXAM: CT ABDOMEN AND PELVIS WITHOUT CONTRAST TECHNIQUE: Multidetector CT imaging of the abdomen and pelvis was performed following the standard protocol without IV contrast. COMPARISON:  CT of the abdomen and pelvis 06/02/2016 FINDINGS: Lower chest: The lung bases are mildly degraded by patient motion. No focal nodule, mass, or airspace disease is present. Hepatobiliary: No focal liver abnormality is seen. Status post cholecystectomy. No biliary dilatation. Pancreas: Unremarkable. No pancreatic ductal dilatation or surrounding inflammatory changes. Spleen: Normal in size without focal abnormality. Adrenals/Urinary Tract: Adrenal glands are normal bilaterally. Kidneys and ureters are within normal limits. There is no stone or hydronephrosis. The urinary bladder is within normal limits. Stomach/Bowel: The stomach and duodenum are within normal limits. Small bowel is unremarkable. Appendix is visualized and normal. The ascending and transverse colon are within normal limits. The descending and sigmoid colon are unremarkable. Vascular/Lymphatic: Aortic atherosclerosis. No enlarged abdominal or pelvic lymph nodes. Reproductive: Uterus and bilateral adnexa are unremarkable. Other: No abdominal wall hernia or abnormality. No abdominopelvic ascites. Musculoskeletal: Multilevel facet degenerative changes are present in the lumbar spine. These are most pronounced at L2-3 and L3-4 with slight anterolisthesis is present. Vertebral body heights alignment contained. There quickly blastic lesions. The bony pelvis is intact. Left total hip arthroplasty is noted. IMPRESSION: 1. No acute or focal lesion to explain the patient's diffuse abdominal pain. 2. Cholecystectomy. 3. Atherosclerosis without aneurysm. 4. Mild degenerative changes of the lumbar spine are likely within normal  limits for age. Electronically Signed   By: San Morelle M.D.   On: 07/01/2016 15:33   Ct Head Wo Contrast  Result Date: 07/01/2016 CLINICAL DATA:  71 year old female with confusion and hypotension. EXAM: CT HEAD WITHOUT CONTRAST TECHNIQUE: Contiguous axial images were obtained from the base of the skull through the vertex without intravenous contrast. COMPARISON:  Head CT without contrast 06/02/2016 and earlier FINDINGS: Brain: Stable cerebral volume. No midline shift, ventriculomegaly, mass effect, evidence of mass lesion, intracranial hemorrhage or evidence of cortically based acute infarction. Gray-white matter differentiation is within normal limits throughout the brain. Vascular: Calcified atherosclerosis at the skull base. No suspicious intracranial vascular hyperdensity. Skull: Stable and negative. No acute osseous abnormality identified. Sinuses/Orbits: Visualized paranasal sinuses and mastoids are stable and well pneumatized. Other: Stable and negative orbit and scalp soft tissues. IMPRESSION: Stable and negative for age noncontrast CT appearance of the brain. Electronically Signed   By: Genevie Ann M.D.   On: 07/01/2016 17:43   Portable Chest 1 View  Result Date: 07/01/2016 CLINICAL DATA:  Encephalopathy EXAM: PORTABLE CHEST 1 VIEW COMPARISON:  Portable exam 1620 hours compared to 10/13/2015 FINDINGS: Intraspinal stimulator projects over the upper thoracic spine at T3-T5. Prior cervical fusion. Normal heart size, mediastinal contours, and pulmonary vascularity. Lungs emphysematous but clear. No pulmonary infiltrate, pleural effusion or pneumothorax. Bones demineralized. IMPRESSION: Probable COPD changes without acute infiltrate. Electronically Signed   By: Lavonia Dana M.D.   On: 07/01/2016 16:28    Procedures Procedures (including critical care time)  Medications Ordered in ED Medications  levothyroxine (SYNTHROID, LEVOTHROID) tablet 75  mcg (not administered)  digoxin (LANOXIN) tablet  250 mcg (not administered)  enoxaparin (LOVENOX) injection 30 mg (not administered)  0.9 %  sodium chloride infusion ( Intravenous Transfusing/Transfer 07/01/16 1909)  acetaminophen (TYLENOL) tablet 650 mg (not administered)    Or  acetaminophen (TYLENOL) suppository 650 mg (not administered)  ondansetron (ZOFRAN) tablet 4 mg (not administered)    Or  ondansetron (ZOFRAN) injection 4 mg (not administered)  docusate sodium (COLACE) capsule 100 mg (not administered)  insulin aspart (novoLOG) injection 0-9 Units (1 Units Subcutaneous Given 07/01/16 1838)  insulin aspart (novoLOG) injection 0-5 Units (not administered)  ondansetron (ZOFRAN) injection 4 mg (4 mg Intravenous Not Given 07/01/16 1811)  oxyCODONE-acetaminophen (PERCOCET/ROXICET) 5-325 MG per tablet 1-2 tablet (not administered)  morphine 4 MG/ML injection 1 mg (1 mg Intravenous Given 07/01/16 1716)  hydrALAZINE (APRESOLINE) injection 10 mg (not administered)  sodium chloride 0.9 % bolus 1,000 mL (0 mLs Intravenous Stopped 07/01/16 1454)  ondansetron (ZOFRAN) injection 4 mg (4 mg Intravenous Given 07/01/16 1352)  morphine 4 MG/ML injection 2 mg (2 mg Intravenous Given 07/01/16 1353)     Initial Impression / Assessment and Plan / ED Course  I have reviewed the triage vital signs and the nursing notes.  Pertinent labs & imaging results that were available during my care of the patient were reviewed by me and considered in my medical decision making (see chart for details).     71 yo F with a cc of Decreased oral intake secondary to abdominal pain. This is a chronic issue worsening over the past week or so. Labs with a marked AK I. Hypertensive on arrival initially I suspect due to hypovolemia. Improved with IV fluids. We'll discuss with hospitalist for admission.  The patient is noted to have a MAP's <65/ SBP's <90. With the current information available to me, I don't think the patient is in septic shock. The MAP's <65/ SBP's <90, is  related to OTHER SHOCKhypovolemic .   The patients results and plan were reviewed and discussed.   Any x-rays performed were independently reviewed by myself.   Differential diagnosis were considered with the presenting HPI.  Medications  levothyroxine (SYNTHROID, LEVOTHROID) tablet 75 mcg (not administered)  digoxin (LANOXIN) tablet 250 mcg (not administered)  enoxaparin (LOVENOX) injection 30 mg (not administered)  0.9 %  sodium chloride infusion ( Intravenous Transfusing/Transfer 07/01/16 1909)  acetaminophen (TYLENOL) tablet 650 mg (not administered)    Or  acetaminophen (TYLENOL) suppository 650 mg (not administered)  ondansetron (ZOFRAN) tablet 4 mg (not administered)    Or  ondansetron (ZOFRAN) injection 4 mg (not administered)  docusate sodium (COLACE) capsule 100 mg (not administered)  insulin aspart (novoLOG) injection 0-9 Units (1 Units Subcutaneous Given 07/01/16 1838)  insulin aspart (novoLOG) injection 0-5 Units (not administered)  ondansetron (ZOFRAN) injection 4 mg (4 mg Intravenous Not Given 07/01/16 1811)  oxyCODONE-acetaminophen (PERCOCET/ROXICET) 5-325 MG per tablet 1-2 tablet (not administered)  morphine 4 MG/ML injection 1 mg (1 mg Intravenous Given 07/01/16 1716)  hydrALAZINE (APRESOLINE) injection 10 mg (not administered)  sodium chloride 0.9 % bolus 1,000 mL (0 mLs Intravenous Stopped 07/01/16 1454)  ondansetron (ZOFRAN) injection 4 mg (4 mg Intravenous Given 07/01/16 1352)  morphine 4 MG/ML injection 2 mg (2 mg Intravenous Given 07/01/16 1353)    Vitals:   07/01/16 1730 07/01/16 1800 07/01/16 1845 07/01/16 1900  BP: (!) 116/56 126/63 (!) 123/59 127/64  Pulse: (!) 58 62 (!) 57 (!) 59  Resp: 18 15 16  17  Temp:      TempSrc:      SpO2: 90% 92% 91% 93%    Final diagnoses:  Abdominal pain  Encephalopathy    Admission/ observation were discussed with the admitting physician, patient and/or family and they are comfortable with the plan.    New  Prescriptions New Prescriptions   No medications on file     Deno Etienne, DO 07/01/16 1914

## 2016-07-01 NOTE — Progress Notes (Signed)
Donnal Debar was paged about pt troponin result called me back to find out if pt is having chest pain, per pt no chest pain and does not fell any different, gave me  oral order to keep pt under observation

## 2016-07-01 NOTE — ED Notes (Signed)
Paged admitting about further pain orders.

## 2016-07-01 NOTE — Consult Note (Signed)
CARDIOLOGY CONSULT NOTE       Patient ID: Destiny Hughes MRN: [id number] DOB/AGE: [birth year] 71 y.o.  Admit date: 07/01/2016 Referring Physician: Aggie Moats Primary Physician: Minette Brine Primary Cardiologist:  Oval Linsey Reason for Consultation: Abnormal ECG/Bradycardia  Principal Problem:   Acute kidney injury [facility]) Active Problems:   Chronic neck pain   Essential hypertension   COPD (chronic obstructive pulmonary disease) (HCC)   Hypothyroidism   GERD (gastroesophageal reflux disease)   Diastolic CHF ([place])   Hyperglycemia   Hyponatremia   Intractable nausea and vomiting   Abnormal EKG   HPI:  71 y.o. asked to evaluate/consult on for abnormal ECG and bradycardia. Previous patient of Dr Mare Ferrari and most recently seen by Dr Oval Linsey on 05/20/16 Patient has history of HTN, MVP, COPD, Elevated lipids and hypothyroidism.  Dr Sherryl Barters notes make mention of a chronically abnormal ECG. She is on digoxin and I cannot tell why. There is no history of systolic heart failure or afib. She has a normal lexiscan myovue in June of 2017.  EF 60-65% She has been ill for 3 weeks starting after a cataract removal. Malaise weakness poor memory and appetite.  Was to see Dr Leta Baptist today for evaluation and he sent her to ER for low BP She has not had fever and does not appear toxic. ? Taking too much pain meds. Seen in ER 3/15 for abdominal pain and confusion. She has been taking methadone CT head and abdomen negative  Telemetry in ECG SB rate 55-65 PR 240 msec no high grade AV block Low BP has responded to saline currently 115/70   ROS All other systems reviewed and negative except as noted above  Past Medical History:  Diagnosis Date  . Arthritis 01-25-11   back and most joints  . Blood transfusion    after Thoracic surgery ?'04  . Chronic back pain   . Chronic neck pain   . Collapse of right lung   . COPD (chronic obstructive pulmonary disease) (HCC)    Hx. Bronchitis occ, 01-25-11  somes issue now taking  Z-pak-started 01-24-11/Scar tissue  present  from previous lung collapse  . Depression   . Dyspnea   . Dysrhythmia    hx.PAT- tx. Inderal  . Emphysema of lung ([place])   . GERD (gastroesophageal reflux disease)    tx. TUMS  . Glaucoma   . Headache(784.0) 01-25-11   neck and nerve pain related.  Marland Kitchen Heart murmur   . Hyperlipemia   . Hypothyroidism    tx. Levothyroxine  . Nephrolithiasis   . Neuromuscular disorder ([place]) 01-25-11   Pain/nerve stimulator implanted -2 yrs ago.  . Palpitation   . Tachycardia     Family History  Problem Relation Age of Onset  . Heart attack Father   . Emphysema Father   . Aneurysm Mother     CEREBRAL  . Emphysema Mother   . Dementia Mother   . Diabetes Son   . Hypertension Son   . Hypertension Son   . Cancer Sister     Widely Metastatic  . Asthma Son     x2  . Breast cancer Paternal Aunt   . Rheum arthritis Maternal Aunt     Social History   Social History  . Marital status: Married    Spouse name: N/A  . Number of children: 2  . Years of education: N/A   Occupational History  . disabled    Social History Main Topics  . Smoking status: Current Every  Day Smoker    Packs/day: 0.75    Years: 50.00    Types: Cigarettes    Start date: 09/13/1961  . Smokeless tobacco: Never Used     Comment: Peak rate of 1ppd  . Alcohol use No     Comment: none in 10 years  . Drug use: No  . Sexual activity: No   Other Topics Concern  . Not on file   Social History Narrative   She is from [facility]. She has always lived in [place]. She has traveled to [place], [place], [place], [place], & [place]. 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 Prior exposure to parakeets with last exposure remote in her current home. Previously had mold in her home that was in her bathroom & fixed. Prior exposure to hot tubs but none recently. Pt lives at home with  [name], husband.  Has 2 childrean, 12th grade education.      Past Surgical History:  Procedure Laterality Date  . APPENDECTOMY  01-25-11  . CARPAL TUNNEL RELEASE  01-25-11   Bil.  . CERVICAL SPINE SURGERY  01-25-11   2005-multiple levels  . CHOLECYSTECTOMY  01-25-11   '07  . COLONOSCOPY WITH PROPOFOL N/A 07/23/2012   Procedure: COLONOSCOPY WITH PROPOFOL;  Surgeon: Garlan Fair, MD;  Location: [facility] END[facility]COPY;  Service: Endoscopy;  Laterality: N/A;  . EYE SURGERY    . JOINT REPLACEMENT  01-25-11   6'03 LTHA-hemi  . KNEE ARTHROPLASTY  01-25-11   '04-right, revised x2  . SPINAL CORD STIMULATOR IMPLANT  2009 APPROX   DR. ELSNER  . THORACIC SPINE SURGERY  01-25-11   6'02- then nerve stimulator implanted after  . TOTAL KNEE REVISION  02/01/2011   Procedure: TOTAL KNEE REVISION;  Surgeon: Mauri Pole;  Location: [facility] ORS;  Service: Orthopedics;  Laterality: Right;  . TUBAL LIGATION       . [START ON 07/02/2016] digoxin  250 mcg Oral q morning - 10a  . docusate sodium  100 mg Oral BID  . enoxaparin (LOVENOX) injection  30 mg Subcutaneous Q24H  . insulin aspart  0-5 Units Subcutaneous QHS  . insulin aspart  0-9 Units Subcutaneous TID WC  . [START ON 07/02/2016] levothyroxine  75 mcg Oral QAC breakfast  . ondansetron (ZOFRAN) IV  4 mg Intravenous Q6H   . sodium chloride 75 mL/hr at 07/01/16 1641    Physical Exam: Blood pressure (!) 116/56, pulse (!) 58, temperature 98.1 F (36.7 C), temperature source Oral, resp. rate 18, SpO2 90 %.   Not very talkative  Chronically ill white female HEENT: normal Neck supple with no adenopathy JVP normal no bruits no thyromegaly Lungs clear with no wheezing and good diaphragmatic motion Heart:  S1/S2 SEM  murmur, no rub, gallop or click PMI normal Abdomen: benighn, BS positve, no tenderness, no AAA no bruit.  No HSM or HJR Distal pulses intact with no bruits No edema Neuro non-focal Skin warm and dry No muscular weakness   Labs:   Lab  Results  Component Value Date   WBC 13.4 (H) 07/01/2016   HGB 21.4 (HH) 07/01/2016   HCT 63.0 (H) 07/01/2016   MCV 83.9 07/01/2016   PLT 328 07/01/2016     Recent Labs Lab 07/01/16 1330 07/01/16 1347  NA 129* 128*  K 4.5 4.5  CL 93* 98*  CO2 20*  --   BUN  47* 53*  CREATININE 2.95* 2.70*  CALCIUM 10.8*  --   PROT 8.4*  --   BILITOT 1.2  --   ALKPHOS 79  --   ALT 31  --   AST 28  --   GLUCOSE 183* 181*   Lab Results  Component Value Date   TROPONINI <0.03 06/02/2016    Lab Results  Component Value Date   CHOL 195 04/28/2015   CHOL 218 (H) 05/21/2014   CHOL 228 (H) 02/17/2014   Lab Results  Component Value Date   HDL 43 (L) 04/28/2015   HDL 40 (L) 05/21/2014   HDL 49 02/17/2014   Lab Results  Component Value Date   LDLCALC 114 04/28/2015   LDLCALC 126 (H) 05/21/2014   LDLCALC 140 (H) 02/17/2014   Lab Results  Component Value Date   TRIG 188 (H) 04/28/2015   TRIG 258 (H) 05/21/2014   TRIG 194 (H) 02/17/2014   Lab Results  Component Value Date   CHOLHDL 4.5 04/28/2015   CHOLHDL 5.5 05/21/2014   CHOLHDL 4.7 02/17/2014   No results found for: LDLDIRECT    Radiology: Ct Abdomen Pelvis Wo Contrast  Result Date: 07/01/2016 CLINICAL DATA:  Diffuse pain all over. Nausea. Weight loss. Difficulty eating. EXAM: CT ABDOMEN AND PELVIS WITHOUT CONTRAST TECHNIQUE: Multidetector CT imaging of the abdomen and pelvis was performed following the standard protocol without IV contrast. COMPARISON:  CT of the abdomen and pelvis 06/02/2016 FINDINGS: Lower chest: The lung bases are mildly degraded by patient motion. No focal nodule, mass, or airspace disease is present. Hepatobiliary: No focal liver abnormality is seen. Status post cholecystectomy. No biliary dilatation. Pancreas: Unremarkable. No pancreatic ductal dilatation or surrounding inflammatory changes. Spleen: Normal in size without focal abnormality. Adrenals/Urinary Tract: Adrenal glands are normal bilaterally.  Kidneys and ureters are within normal limits. There is no stone or hydronephrosis. The urinary bladder is within normal limits. Stomach/Bowel: The stomach and duodenum are within normal limits. Small bowel is unremarkable. Appendix is visualized and normal. The ascending and transverse colon are within normal limits. The descending and sigmoid colon are unremarkable. Vascular/Lymphatic: Aortic atherosclerosis. No enlarged abdominal or pelvic lymph nodes. Reproductive: Uterus and bilateral adnexa are unremarkable. Other: No abdominal wall hernia or abnormality. No abdominopelvic ascites. Musculoskeletal: Multilevel facet degenerative changes are present in the lumbar spine. These are most pronounced at L2-3 and L3-4 with slight anterolisthesis is present. Vertebral body heights alignment contained. There quickly blastic lesions. The bony pelvis is intact. Left total hip arthroplasty is noted. IMPRESSION: 1. No acute or focal lesion to explain the patient's diffuse abdominal pain. 2. Cholecystectomy. 3. Atherosclerosis without aneurysm. 4. Mild degenerative changes of the lumbar spine are likely within normal limits for age. Electronically Signed   By: San Morelle M.D.   On: 07/01/2016 15:33   Ct Head Wo Contrast  Result Date: 07/01/2016 CLINICAL DATA:  71 year old female with confusion and hypotension. EXAM: CT HEAD WITHOUT CONTRAST TECHNIQUE: Contiguous axial images were obtained from the base of the skull through the vertex without intravenous contrast. COMPARISON:  Head CT without contrast 06/02/2016 and earlier FINDINGS: Brain: Stable cerebral volume. No midline shift, ventriculomegaly, mass effect, evidence of mass lesion, intracranial hemorrhage or evidence of cortically based acute infarction. Gray-white matter differentiation is within normal limits throughout the brain. Vascular: Calcified atherosclerosis at the skull base. No suspicious intracranial vascular hyperdensity. Skull: Stable and  negative. No acute osseous abnormality identified. Sinuses/Orbits: Visualized paranasal sinuses and mastoids are stable and well  pneumatized. Other: Stable and negative orbit and scalp soft tissues. IMPRESSION: Stable and negative for age noncontrast CT appearance of the brain. Electronically Signed   By: Genevie Ann M.D.   On: 07/01/2016 17:43   Ct Head Wo Contrast  Result Date: 06/02/2016 CLINICAL DATA:  Confusion for 3 days EXAM: CT HEAD WITHOUT CONTRAST TECHNIQUE: Contiguous axial images were obtained from the base of the skull through the vertex without intravenous contrast. COMPARISON:  June 29, 2015 FINDINGS: Brain: Mild diffuse atrophy is stable. There is no appreciable intracranial mass, hemorrhage, extra-axial fluid collection, or midline shift. There is mild patchy small vessel disease in the centra semiovale bilaterally. No new gray-white compartment lesion is evident. No acute infarct appreciable. Vascular: No hyperdense vessel. There is calcification in each carotid siphon region. There are calcifications in distal vertebral arteries bilaterally. Skull: Bony calvarium appears intact. Sinuses/Orbits: There is mild mucosal thickening in several ethmoid air cells bilaterally. Other visualized paranasal sinuses are clear. There is mild leftward deviation of the nasal septum. Orbits appear symmetric bilaterally except for previous cataract removal on the left. Other: Mastoid air cells are clear. IMPRESSION: Stable atrophy with mild periventricular small vessel disease. No intracranial mass, hemorrhage, or extra-axial fluid collection. No acute infarct. Areas of arterial vascular calcification noted. Mild mucosal thickening in several ethmoid air cells. Electronically Signed   By: Lowella Grip III M.D.   On: 06/02/2016 12:39   Ct Abdomen Pelvis W Contrast  Result Date: 06/02/2016 CLINICAL DATA:  Abdominal pain, confusion. EXAM: CT ABDOMEN AND PELVIS WITH CONTRAST TECHNIQUE: Multidetector CT imaging  of the abdomen and pelvis was performed using the standard protocol following bolus administration of intravenous contrast. CONTRAST:  165mL ISOVUE-300 IOPAMIDOL (ISOVUE-300) INJECTION 61% COMPARISON:  PET CT 05/17/2005 FINDINGS: Lower chest: No acute findings. Hepatobiliary: Prior cholecystectomy.  No focal hepatic abnormality. Pancreas: Mildly atrophic. No focal abnormality or ductal dilatation. Spleen: No focal abnormality.  Normal size. Adrenals/Urinary Tract: Small cysts in the kidneys bilaterally. No hydronephrosis. Adrenal glands and urinary bladder unremarkable. Stomach/Bowel: Stomach, large and small bowel grossly unremarkable. Vascular/Lymphatic: Aortic and iliac calcifications. No aneurysm or adenopathy. Reproductive: Uterus and adnexa unremarkable. No mass. Prominent venous structures in the adnexa bilaterally. Other: No free fluid or free air. Musculoskeletal: No acute bony abnormality. IMPRESSION: No acute findings in the abdomen or pelvis. Aortoiliac atherosclerosis. Electronically Signed   By: Rolm Baptise M.D.   On: 06/02/2016 12:58   Portable Chest 1 View  Result Date: 07/01/2016 CLINICAL DATA:  Encephalopathy EXAM: PORTABLE CHEST 1 VIEW COMPARISON:  Portable exam 1620 hours compared to 10/13/2015 FINDINGS: Intraspinal stimulator projects over the upper thoracic spine at T3-T5. Prior cervical fusion. Normal heart size, mediastinal contours, and pulmonary vascularity. Lungs emphysematous but clear. No pulmonary infiltrate, pleural effusion or pneumothorax. Bones demineralized. IMPRESSION: Probable COPD changes without acute infiltrate. Electronically Signed   By: Lavonia Dana M.D.   On: 07/01/2016 16:28    EKG:  SB first degree ? Digoxin affect LAFB chronic ST/T wave changes since 2012 rate 61   ASSESSMENT AND PLAN:   Abnormal ECG:  Chronic for over 6 years. Stop digoxin I see no clinical reason for it. Level is pedning No evidence for acute MI or ischemic event troponin pending as  well  Bradycardia:  Stop inderal and digoxin observe on telemetry  Hypotension:  Pre renal stop ACE and diuretic responded to fluid check lactate r/o sepsis per primary service Normal lactate level and recent abdominal CT reassuring   P-vera:  Etiology of elevated Hct not certain check ABG and RBC mass. Will likely come down with hydration But may be partially responsible for MS changes along with pain meds and digoxin if toxic  Signed: Jenkins Rouge 07/01/2016, 6:13 PM

## 2016-07-01 NOTE — Progress Notes (Signed)
[facility]  PATIENT: Destiny Hughes DOB: 03/04/1946  REFERRING CLINICIAN: Othelia Pulling. NP HISTORY FROM: patient  REASON FOR VISIT: new consult    HISTORICAL  CHIEF COMPLAINT:  Chief Complaint  Patient presents with  . Follow-up  . Memory Loss    (having abd pain, decreased appetite. low Bp) attempted to hydrate, could not keep down.   May need ER visit for hydration.     HISTORY OF PRESENT ILLNESS:   71 year old female here for evaluation of memory loss.  Patient has history of hypertension, COPD, hyperlipidemia, hypothyroidism, chronic pain, depression.  Patient has been on chronic narcotic medications for many years, most recently on oxycodone and methadone, which was discontinued 1 week ago.  In 05/28/16 patient's sister passed away, and patient had significant grieving, depression and anxiety.  05/26/2016 patient had left cataract surgery and following this patient had progressively increasing confusion, nausea, vomiting, decreased by mouth intake, abdominal pain, lethargy and confusion. Patient went to the emergency room on 06/02/16, had CT scan of the head, CT of abdomen and pelvis, lab testing, was diagnosed with gastritis versus opiate withdrawal.  Symptoms progressively worsened over the past month. She has had continued nausea, vomiting, confusion, lethargy, weight loss. Patient saw PCP who referred patient to me for evaluation of possible underlying cognitive disorder.  Today patient is in a wheelchair, having significant vomiting in the office. She is lethargic, poorly responsive, has low blood pressure of 78/54. She was able to complete Mini-Mental status testing and scored 20 out of 30. She follows some commands. She is able to respond verbally as well.    REVIEW OF SYSTEMS: Full 14 system review of systems performed and negative with exception of: Memory loss confusion headache weakness difficult swallowing weight loss fatigue ringing  in ears cramps feeling hot feeling cold decreased energy change in appetite disinterest in activities depression.   ALLERGIES: Allergies  Allergen Reactions  . Albuterol Other (See Comments)    Had difficulty breathing after a nebulizer treatment  . Ventolin [Kdc:Albuterol] Anaphylaxis  . Sulfa Antibiotics Swelling  . Codeine Nausea And Vomiting  . Ecotrin [Aspirin] Nausea And Vomiting  . Penicillins Swelling    Has patient had a PCN reaction causing immediate rash, facial/tongue/throat swelling, SOB or lightheadedness with hypotension: No Has patient had a PCN reaction causing severe rash involving mucus membranes or skin necrosis: No Has patient had a PCN reaction that required hospitalization No Has patient had a PCN reaction occurring within the last 10 years: No If all of the above answers are "NO", then may proceed with Cephalosporin use.    HOME MEDICATIONS: Outpatient Medications Prior to Visit  Medication Sig Dispense Refill  . digoxin (DIGOX) 0.25 MG tablet Take 1 tablet (250 mcg total) by mouth every morning. 90 tablet 1  . furosemide (LASIX) 20 MG tablet Take 1 tablet (20 mg total) by mouth daily. 30 tablet 5  . levothyroxine (SYNTHROID, LEVOTHROID) 150 MCG tablet TAKE 1 TO 1+1/2 TABLETS DAILY AS DIRECTED 135 tablet 1  . lisinopril (PRINIVIL,ZESTRIL) 10 MG tablet TAKE 1 TABLET(10 MG) BY MOUTH DAILY 30 tablet 8  . propranolol (INDERAL) 80 MG tablet Take 1 tablet (80 mg total) by mouth 2 (two) times daily. 180 tablet 1  . sertraline (ZOLOFT) 100 MG tablet Take 100 mg by mouth at bedtime.     . Biotin (BIOTIN 5000) 5 MG CAPS Take 1 capsule by mouth daily.      . Cholecalciferol (VITAMIN D PO)  Take 5,000 Units by mouth daily.    . methadone (DOLOPHINE) 5 MG tablet Take 5 mg by mouth every 6 (six) hours.     Marland Kitchen oxyCODONE (ROXICODONE) 15 MG immediate release tablet Take 1 tablet by mouth every 6 (six) hours as needed for pain.   0  . pantoprazole (PROTONIX) 20 MG tablet Take 1  tablet (20 mg total) by mouth daily. (Patient not taking: Reported on 07/01/2016) 30 tablet 0  . roflumilast (DALIRESP) 500 MCG TABS tablet Take 1 tablet (500 mcg total) by mouth daily. (Patient not taking: Reported on 06/02/2016) 2 tablet 0  . tiZANidine (ZANAFLEX) 4 MG tablet Take 4 mg by mouth every 6 (six) hours as needed for muscle spasms.     No facility-administered medications prior to visit.     PAST MEDICAL HISTORY: Past Medical History:  Diagnosis Date  . Arthritis 01-25-11   back and most joints  . Blood transfusion    after Thoracic surgery ?'04  . Chronic back pain   . Chronic neck pain   . Collapse of right lung   . COPD (chronic obstructive pulmonary disease) (HCC)    Hx. Bronchitis occ, 01-25-11 somes issue now taking  Z-pak-started 01-24-11/Scar tissue  present  from previous lung collapse  . Depression   . Dyspnea   . Dysrhythmia    hx.PAT- tx. Inderal  . Emphysema of lung ([place])   . GERD (gastroesophageal reflux disease)    tx. TUMS  . Glaucoma   . Headache(784.0) 01-25-11   neck and nerve pain related.  Marland Kitchen Heart murmur   . Hyperlipemia   . Hypothyroidism    tx. Levothyroxine  . Nephrolithiasis   . Neuromuscular disorder ([place]) 01-25-11   Pain/nerve stimulator implanted -2 yrs ago.  . Palpitation   . Tachycardia     PAST SURGICAL HISTORY: Past Surgical History:  Procedure Laterality Date  . APPENDECTOMY  01-25-11  . CARPAL TUNNEL RELEASE  01-25-11   Bil.  . CERVICAL SPINE SURGERY  01-25-11   2005-multiple levels  . CHOLECYSTECTOMY  01-25-11   '07  . COLONOSCOPY WITH PROPOFOL N/A 07/23/2012   Procedure: COLONOSCOPY WITH PROPOFOL;  Surgeon: Garlan Fair, MD;  Location: [facility];  Service: Endoscopy;  Laterality: N/A;  . EYE SURGERY    . JOINT REPLACEMENT  01-25-11   6'03 LTHA-hemi  . KNEE ARTHROPLASTY  01-25-11   '04-right, revised x2  . SPINAL CORD STIMULATOR IMPLANT  2009 APPROX   DR. ELSNER  . THORACIC SPINE SURGERY  01-25-11   6'02- then nerve  stimulator implanted after  . TOTAL KNEE REVISION  02/01/2011   Procedure: TOTAL KNEE REVISION;  Surgeon: Mauri Pole;  Location: [facility];  Service: Orthopedics;  Laterality: Right;  . TUBAL LIGATION      FAMILY HISTORY: Family History  Problem Relation Age of Onset  . Heart attack Father   . Emphysema Father   . Aneurysm Mother     CEREBRAL  . Emphysema Mother   . Dementia Mother   . Diabetes Son   . Hypertension Son   . Hypertension Son   . Cancer Sister     Widely Metastatic  . Asthma Son     x2  . Breast cancer Paternal Aunt   . Rheum arthritis Maternal Aunt     SOCIAL HISTORY:  Social History   Social History  . Marital status: Married    Spouse name: N/A  . Number of children: 2  . Years  of education: N/A   Occupational History  . disabled    Social History Main Topics  . Smoking status: Current Every Day Smoker    Packs/day: 0.75    Years: 50.00    Types: Cigarettes    Start date: 09/13/1961  . Smokeless tobacco: Never Used     Comment: Peak rate of 1ppd  . Alcohol use No     Comment: none in 10 years  . Drug use: No  . Sexual activity: No   Other Topics Concern  . Not on file   Social History Narrative   She is from [facility] [place]. She has always lived in [place]. She has traveled to [place], [place], [place], [place], & [place]. 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 Prior exposure to parakeets with last exposure remote in her current home. Previously had mold in her home that was in her bathroom & fixed. Prior exposure to hot tubs but none recently.      PHYSICAL EXAM  GENERAL EXAM/CONSTITUTIONAL: Vitals:  Vitals:   07/01/16 1149  BP: (!) 78/54  Pulse: 70  Weight: 132 lb 6.4 oz (60.1 kg)  Height: 5\' 6"  (1.676 m)     Body mass index is 21.37 kg/m.  No exam data present  PATIENT APPEARS SICK, WEAK, DISTRESSED; BARELY CAN KEEP EYES  OPEN  INTERMITTENT NAUSEA  CARDIOVASCULAR:  Examination of carotid arteries is normal; no carotid bruits  Regular rate and rhythm, no murmurs  Examination of peripheral vascular system by observation and palpation is normal  EYES:  Ophthalmoscopic exam of optic discs and posterior segments is normal; no papilledema or hemorrhages  MUSCULOSKELETAL:  Gait, strength, tone, movements noted in Neurologic exam below  NEUROLOGIC: MENTAL STATUS:  MMSE - Mini Mental State Exam 07/01/2016  Orientation to time 1  Orientation to Place 3  Registration 3  Attention/ Calculation 3  Recall 1  Language- name 2 objects 2  Language- repeat 1  Language- follow 3 step command 3  Language- read & follow direction 1  Write a sentence 1  Copy design 1  Total score 20    SLEEPY; CONFUSED  DECR memory   DECR attention and concentration  DECR FLUENCY; comprehension intact, naming intact,   fund of knowledge appropriate  CRANIAL NERVE:   2nd - no papilledema on fundoscopic exam  2nd, 3rd, 4th, 6th - pupils equal and reactive to light, visual fields full to confrontation, extraocular muscles intact, no nystagmus  5th - facial sensation symmetric  7th - facial strength symmetric  8th - hearing intact  9th - palate elevates symmetrically, uvula midline  11th - shoulder shrug symmetric  12th - tongue protrusion midline  MOTOR:   DIFFUSE WEAKNESS  SENSORY:   normal and symmetric to light touch  COORDINATION:   finger-nose-finger, fine finger movements SLOW  REFLEXES:   deep tendon reflexes TRACE and symmetric  GAIT/STATION:   IN WHEEL CHAIR    DIAGNOSTIC DATA (LABS, IMAGING, TESTING) - I reviewed patient records, labs, notes, testing and imaging myself where available.  Lab Results  Component Value Date   WBC 11.0 (H) 06/02/2016   HGB 16.7 (H) 06/02/2016   HCT 48.9 (H) 06/02/2016   MCV 84.7 06/02/2016   PLT 317 06/02/2016      Component Value Date/Time    NA 129 (L) 06/02/2016 1045   K 3.8 06/02/2016 1045  CL 90 (L) 06/02/2016 1045   CO2 30 06/02/2016 1045   GLUCOSE 129 (H) 06/02/2016 1045   BUN 14 06/02/2016 1045   CREATININE 1.01 (H) 06/02/2016 1045   CREATININE 0.87 12/17/2015 1304   CALCIUM 9.7 06/02/2016 1045   PROT 8.1 06/02/2016 1045   ALBUMIN 4.4 06/02/2016 1045   AST 20 06/02/2016 1045   ALT 21 06/02/2016 1045   ALKPHOS 73 06/02/2016 1045   BILITOT 1.1 06/02/2016 1045   GFRNONAA 54 (L) 06/02/2016 1045   GFRNONAA 65 04/28/2015 1525   GFRAA >60 06/02/2016 1045   GFRAA 75 04/28/2015 1525   Lab Results  Component Value Date   CHOL 195 04/28/2015   HDL 43 (L) 04/28/2015   LDLCALC 114 04/28/2015   TRIG 188 (H) 04/28/2015   CHOLHDL 4.5 04/28/2015   Lab Results  Component Value Date   HGBA1C 6.4 (H) 04/28/2015   No results found for: VITAMINB12 Lab Results  Component Value Date   TSH 1.04 04/28/2015    06/02/16 CT head [I reviewed images myself and agree with interpretation. -VRP]  - Stable atrophy with mild periventricular small vessel disease. No intracranial mass, hemorrhage, or extra-axial fluid collection. No acute infarct. Areas of arterial vascular calcification noted. Mild mucosal thickening in several ethmoid air cells.  06/02/16 CT abd/pelvis - No acute findings in the abdomen or pelvis. - Aortoiliac atherosclerosis.      ASSESSMENT AND PLAN  71 y.o. year old female here with memory loss and confusion since early March 2018, in setting of her sister passing away in Feb 2018, cataract surgery in May 26, 2016, chronic pain meds, stopping pain meds (oxycodone and methadone 1 week ago), with 1 month of intractable nausea/vomiting, dehydration. Now with hypotension, vomiting, malaise and confusion.    Ddx: delirium due to (depression, decreased PO intake, dehydration, hyponatremia, narcotic withdrawal)  1. Delirium   2. Intractable vomiting with nausea, unspecified vomiting type   3. Other specified  hypotension   4. Dehydration      PLAN: - immediate referral to ER for evaluation of dehydration, intractable nausea/vomiting, hypotension; consider CT head, CBC, CMP, Ua/Ucx  - defer to ER and internal medicine re: intractable abdominal pain and nausea  Return to be determined after ER evaluation.  I reviewed images, labs, notes, records myself. I summarized findings and reviewed with patient, for this high risk condition (hypotension, confusion, vomiting) requiring high complexity decision making.    Penni Bombard, MD 0/12/2723, 36:64 PM Certified in Neurology, Neurophysiology and Neuroimaging  [facility] [address] [place], [place] [address] [phone]

## 2016-07-01 NOTE — ED Notes (Signed)
Pt given ginger ale.

## 2016-07-01 NOTE — ED Notes (Signed)
Dr Johnsie Cancel at bedside to eval pt

## 2016-07-01 NOTE — ED Notes (Signed)
No lab draw,  Pt enroute to inpatient

## 2016-07-01 NOTE — Progress Notes (Signed)
CRITICAL VALUE ALERT  Critical value received:  Troponin 0.06   Date of notification: 07/01/2016  Time of notification: 2110   Critical value read back: yes  Nurse who received alert: Bing Plume  MD notified (1st page):  lynch  Time of first page:  2112  MD notified (2nd page):  Time of second page:  Responding MD:  lynch  Time MD responded:  2118

## 2016-07-02 DIAGNOSIS — H409 Unspecified glaucoma: Secondary | ICD-10-CM | POA: Diagnosis present

## 2016-07-02 DIAGNOSIS — R9431 Abnormal electrocardiogram [ECG] [EKG]: Secondary | ICD-10-CM | POA: Diagnosis not present

## 2016-07-02 DIAGNOSIS — E861 Hypovolemia: Secondary | ICD-10-CM | POA: Diagnosis present

## 2016-07-02 DIAGNOSIS — R001 Bradycardia, unspecified: Secondary | ICD-10-CM | POA: Diagnosis present

## 2016-07-02 DIAGNOSIS — F329 Major depressive disorder, single episode, unspecified: Secondary | ICD-10-CM | POA: Diagnosis present

## 2016-07-02 DIAGNOSIS — I5032 Chronic diastolic (congestive) heart failure: Secondary | ICD-10-CM | POA: Diagnosis present

## 2016-07-02 DIAGNOSIS — M549 Dorsalgia, unspecified: Secondary | ICD-10-CM | POA: Diagnosis present

## 2016-07-02 DIAGNOSIS — I130 Hypertensive heart and chronic kidney disease with heart failure and stage 1 through stage 4 chronic kidney disease, or unspecified chronic kidney disease: Secondary | ICD-10-CM | POA: Diagnosis present

## 2016-07-02 DIAGNOSIS — E039 Hypothyroidism, unspecified: Secondary | ICD-10-CM | POA: Diagnosis present

## 2016-07-02 DIAGNOSIS — Z8249 Family history of ischemic heart disease and other diseases of the circulatory system: Secondary | ICD-10-CM | POA: Diagnosis not present

## 2016-07-02 DIAGNOSIS — N179 Acute kidney failure, unspecified: Secondary | ICD-10-CM | POA: Diagnosis present

## 2016-07-02 DIAGNOSIS — F17210 Nicotine dependence, cigarettes, uncomplicated: Secondary | ICD-10-CM | POA: Diagnosis present

## 2016-07-02 DIAGNOSIS — E871 Hypo-osmolality and hyponatremia: Secondary | ICD-10-CM | POA: Diagnosis present

## 2016-07-02 DIAGNOSIS — N189 Chronic kidney disease, unspecified: Secondary | ICD-10-CM | POA: Diagnosis not present

## 2016-07-02 DIAGNOSIS — R739 Hyperglycemia, unspecified: Secondary | ICD-10-CM | POA: Diagnosis present

## 2016-07-02 DIAGNOSIS — Z96653 Presence of artificial knee joint, bilateral: Secondary | ICD-10-CM | POA: Diagnosis present

## 2016-07-02 DIAGNOSIS — Z79899 Other long term (current) drug therapy: Secondary | ICD-10-CM | POA: Diagnosis not present

## 2016-07-02 DIAGNOSIS — G8929 Other chronic pain: Secondary | ICD-10-CM | POA: Diagnosis present

## 2016-07-02 DIAGNOSIS — E860 Dehydration: Secondary | ICD-10-CM | POA: Diagnosis present

## 2016-07-02 DIAGNOSIS — I959 Hypotension, unspecified: Secondary | ICD-10-CM | POA: Diagnosis present

## 2016-07-02 DIAGNOSIS — E785 Hyperlipidemia, unspecified: Secondary | ICD-10-CM | POA: Diagnosis present

## 2016-07-02 DIAGNOSIS — K219 Gastro-esophageal reflux disease without esophagitis: Secondary | ICD-10-CM | POA: Diagnosis present

## 2016-07-02 DIAGNOSIS — R112 Nausea with vomiting, unspecified: Secondary | ICD-10-CM | POA: Diagnosis present

## 2016-07-02 DIAGNOSIS — M542 Cervicalgia: Secondary | ICD-10-CM | POA: Diagnosis present

## 2016-07-02 LAB — CBC
HEMATOCRIT: 45.9 % (ref 36.0–46.0)
Hemoglobin: 16.5 g/dL — ABNORMAL HIGH (ref 12.0–15.0)
MCH: 30.3 pg (ref 26.0–34.0)
MCHC: 35.9 g/dL (ref 30.0–36.0)
MCV: 84.2 fL (ref 78.0–100.0)
PLATELETS: 270 10*3/uL (ref 150–400)
RBC: 5.45 MIL/uL — ABNORMAL HIGH (ref 3.87–5.11)
RDW: 14 % (ref 11.5–15.5)
WBC: 12.8 10*3/uL — AB (ref 4.0–10.5)

## 2016-07-02 LAB — BASIC METABOLIC PANEL
Anion gap: 8 (ref 5–15)
BUN: 45 mg/dL — AB (ref 6–20)
CHLORIDE: 100 mmol/L — AB (ref 101–111)
CO2: 23 mmol/L (ref 22–32)
CREATININE: 1.85 mg/dL — AB (ref 0.44–1.00)
Calcium: 8.9 mg/dL (ref 8.9–10.3)
GFR calc Af Amer: 30 mL/min — ABNORMAL LOW (ref >60)
GFR calc non Af Amer: 26 mL/min — ABNORMAL LOW (ref >60)
Glucose, Bld: 145 mg/dL — ABNORMAL HIGH (ref 65–99)
POTASSIUM: 4.3 mmol/L (ref 3.5–5.1)
SODIUM: 131 mmol/L — AB (ref 135–145)

## 2016-07-02 LAB — GLUCOSE, CAPILLARY
GLUCOSE-CAPILLARY: 114 mg/dL — AB (ref 65–99)
GLUCOSE-CAPILLARY: 122 mg/dL — AB (ref 65–99)
GLUCOSE-CAPILLARY: 127 mg/dL — AB (ref 65–99)
Glucose-Capillary: 128 mg/dL — ABNORMAL HIGH (ref 65–99)

## 2016-07-02 LAB — TROPONIN I: Troponin I: 0.07 ng/mL (ref <0.03)

## 2016-07-02 LAB — RPR: RPR Ser Ql: NONREACTIVE

## 2016-07-02 LAB — DIGOXIN LEVEL: Digoxin Level: 1.4 ng/mL (ref 0.8–2.0)

## 2016-07-02 MED ORDER — DIPHENHYDRAMINE HCL 50 MG/ML IJ SOLN
25.0000 mg | Freq: Once | INTRAMUSCULAR | Status: AC
  Administered 2016-07-02: 25 mg via INTRAVENOUS
  Filled 2016-07-02: qty 1

## 2016-07-02 MED ORDER — SODIUM CHLORIDE 0.9 % IV SOLN
INTRAVENOUS | Status: AC
  Administered 2016-07-02: 17:00:00 via INTRAVENOUS

## 2016-07-02 MED ORDER — METOCLOPRAMIDE HCL 5 MG/ML IJ SOLN
10.0000 mg | Freq: Once | INTRAMUSCULAR | Status: AC
  Administered 2016-07-02: 10 mg via INTRAVENOUS
  Filled 2016-07-02: qty 2

## 2016-07-02 NOTE — Progress Notes (Signed)
MD on call notified that patient has photophobia, vomiting, bp is very soft, patient is complaining of a terrible headache. Pt threw up just after tylenol administration. Dr. Hal Hope ordered 25 mg IV benadryl and 10mg  IV reglan.  Will administer and continue to monitor patient.

## 2016-07-02 NOTE — Evaluation (Signed)
Physical Therapy Evaluation Patient Details Name: Destiny Hughes MRN: [id number] DOB: July 11, 1945 Today's Date: 07/02/2016   History of Present Illness  Patient is a 71 yo female admitted 07/01/16 with N/V, AMS, hypotension.  Patient with dehydration, AKI, and bradycardia.   PMH:  COPD, HTN, HLD, chronic neck/back pain with surgeries including pain/nerve stimulator implanted, CHF  Clinical Impression  Patient presents with problems listed below.  Will benefit from acute PT to maximize functional mobility prior to discharge home with husband.  Patient with decreased strength, activity tolerance, and balance, all impacting mobility, gait, and safety.  Recommend HHPT at d/c for continued therapy.  Also recommend patient use cane for safety/balance during gait.    Follow Up Recommendations Home health PT;Supervision for mobility/OOB    Equipment Recommendations  None recommended by PT    Recommendations for Other Services       Precautions / Restrictions Precautions Precautions: Fall Precaution Comments: Patient has fallen at home at least x2 with injury to forearm, and hit head on bedside table per husband. Restrictions Weight Bearing Restrictions: No      Mobility  Bed Mobility Overal bed mobility: Modified Independent                Transfers Overall transfer level: Needs assistance Equipment used: None Transfers: Sit to/from Stand Sit to Stand: Min assist         General transfer comment: Min assist to steady during transfer.  Cues to stand for several seconds prior to gait.  Ambulation/Gait Ambulation/Gait assistance: Min assist Ambulation Distance (Feet): 82 Feet Assistive device: None Gait Pattern/deviations: Step-through pattern;Decreased stride length;Shuffle;Drifts right/left;Trunk flexed Gait velocity: decreased Gait velocity interpretation: Below normal speed for age/gender General Gait Details: Patient with slow, unsteady gait with no assistive  device.  Cues to try to hold head up and look forward during gait.  Encouraged use of cane at home.  Stairs            Wheelchair Mobility    Modified Rankin (Stroke Patients Only)       Balance Overall balance assessment: Needs assistance;History of Falls   Sitting balance-Leahy Scale: Fair     Standing balance support: No upper extremity supported Standing balance-Leahy Scale: Fair Standing balance comment: Requires assist with dynamic activities/gait.                             Pertinent Vitals/Pain Pain Assessment: 0-10 Pain Score: 8  Pain Location: Head Pain Descriptors / Indicators: Headache;Grimacing Pain Intervention(s): Monitored during session;Patient requesting pain meds-RN notified    Home Living Family/patient expects to be discharged to:: Private residence Living Arrangements: Spouse/significant other Available Help at Discharge: Family;Available 24 hours/day Type of Home: House Home Access: Stairs to enter Entrance Stairs-Rails: Psychiatric nurse of Steps: 2 Home Layout: One level Home Equipment: Walker - 2 wheels;Cane - single point;Shower seat      Prior Function Level of Independence: Needs assistance   Gait / Transfers Assistance Needed: Patient ambulates in the house with cane, or furniture walks.  Holds on to husband when outside of house.  ADL's / Homemaking Assistance Needed: Husband assists with meal prep, housekeeping, bathing.        Hand Dominance        Extremity/Trunk Assessment   Upper Extremity Assessment Upper Extremity Assessment: Generalized weakness    Lower Extremity Assessment Lower Extremity Assessment: Generalized weakness    Cervical / Trunk Assessment Cervical / Trunk  Assessment: Kyphotic (with forward head.  Decreased cervical ROM)  Communication   Communication: No difficulties  Cognition Arousal/Alertness: Awake/alert Behavior During Therapy: Flat affect Overall  Cognitive Status: Within Functional Limits for tasks assessed                                 General Comments: Slow to respond to questions      General Comments      Exercises     Assessment/Plan    PT Assessment Patient needs continued PT services  PT Problem List Decreased strength;Decreased activity tolerance;Decreased balance;Decreased mobility;Decreased knowledge of use of DME;Pain       PT Treatment Interventions DME instruction;Gait training;Functional mobility training;Therapeutic exercise;Stair training;Therapeutic activities;Balance training;Patient/family education    PT Goals (Current goals can be found in the Care Plan section)  Acute Rehab PT Goals Patient Stated Goal: To go home PT Goal Formulation: With patient/family Time For Goal Achievement: 07/09/16 Potential to Achieve Goals: Good    Frequency Min 3X/week   Barriers to discharge        Co-evaluation               End of Session Equipment Utilized During Treatment: Gait belt Activity Tolerance: Patient limited by fatigue Patient left: in bed;with call bell/phone within reach;with bed alarm set;with family/visitor present;with nursing/sitter in room Nurse Communication: Mobility status;Patient requests pain meds (Recommend HHPT) PT Visit Diagnosis: Unsteadiness on feet (R26.81);Other abnormalities of gait and mobility (R26.89);History of falling (Z91.81);Muscle weakness (generalized) (M62.81);Pain Pain - part of body:  (Headache)    Time: 8416-6063 PT Time Calculation (min) (ACUTE ONLY): 17 min   Charges:   PT Evaluation $PT Eval Moderate Complexity: 1 Procedure     PT G Codes:   PT G-Codes **NOT FOR INPATIENT CLASS** Functional Assessment Tool Used: AM-PAC 6 Clicks Basic Mobility Functional Limitation: Mobility: Walking and moving around Mobility: Walking and Moving Around Current Status (K1601): At least 40 percent but less than 60 percent impaired, limited or  restricted Mobility: Walking and Moving Around Goal Status [phone]): At least 1 percent but less than 20 percent impaired, limited or restricted    Carita Pian. Sanjuana Kava, [facility] Acute Rehab Services Pager [phone]   Despina Pole 07/02/2016, 3:05 PM

## 2016-07-02 NOTE — Progress Notes (Signed)
PROGRESS NOTE    Destiny Hughes  ERX:[id number] DOB: 12-Feb-1946 DOA: 07/01/2016 PCP: Minette Brine    Brief Narrative:  71 y.o. female with a Past Medical History significant for COPD, hypertension, hyperlipidemia, low thyroid who presents with AMS.  Likely multifactorial from dehydration and acute kidney injury.  Assessment & Plan:   Principal Problem:   Acute kidney injury ([place]) - We'll continue to hold nephrotoxic agents. Improving with IV fluid rehydration and improved oral intake. I still suspect patient is intravascularly depleted as such will reorder initial IV fluid orders. - Reassess BMP next a.m.  Active Problems:   Chronic neck pain - Stable on home medication regimen will continue  Bradycardia -Cardiology consulted    Essential hypertension - Soft blood pressures secondary to decreased intravascular volume continue to hold home antihypertensive regimen    COPD (chronic obstructive pulmonary disease) ([place]) - Compensated currently    Hypothyroidism - Stable on Synthroid no signs or symptoms reported that coincide with hypothyroidism at this point    GERD (gastroesophageal reflux disease) - Stable no new complaints    Diastolic CHF ([place]) - Again I suspect patient is intravascularly depleted. Continue gentle fluid rehydration    Hyperglycemia - Advance diet to diabetic diet    Hyponatremia - Currently getting normal saline IV fluid rehydration    Intractable nausea and vomiting - Resolving continue supportive therapy with antiemetics as needed.    DVT prophylaxis: Lovenox Code Status: Full Family Communication: Discussed with spouse at bedside Disposition Plan: pending improvement in condition   Consultants:   Cardiologist   Procedures: None   Antimicrobials: None   Subjective: The patient has no new complaints. No acute issues overnight. Stating that she feels better but still not able to eat back to baseline.  Objective: Vitals:   07/01/16 2001 07/02/16 0558 07/02/16 0614 07/02/16 1335  BP: 115/64 (!) 111/58  (!) 98/47  Pulse: (!) 53 63  (!) 56  Resp: 18 18  18   Temp: 97.4 F (36.3 C) 98.2 F (36.8 C)  98.2 F (36.8 C)  TempSrc: Oral     SpO2: 98% 95%  96%  Weight: 62.8 kg (138 lb 7.2 oz)  62.8 kg (138 lb 7.2 oz)   Height: 5\' 6"  (1.676 m)       Intake/Output Summary (Last 24 hours) at 07/02/16 1605 Last data filed at 07/02/16 0908  Gross per 24 hour  Intake             1760 ml  Output                0 ml  Net             1760 ml   Filed Weights   07/01/16 2001 07/02/16 0614  Weight: 62.8 kg (138 lb 7.2 oz) 62.8 kg (138 lb 7.2 oz)    Examination:  General exam: Appears calm and comfortable , In no acute distress Respiratory system: Clear to auscultation. Respiratory effort normal. Cardiovascular system: S1 & S2 heard, RRR. No JVD, murmurs Abdomen: Normal bowel sounds heard. Nondistended, soft Central nervous system: Alert and oriented. No focal neurological deficits. Extremities: Symmetric 5 x 5 power. Skin: No rashes, lesions or ulcers, limited exam Psychiatry: Judgement and insight appear normal. Mood & affect appropriate.   Data Reviewed: I have personally reviewed following labs and imaging studies  CBC:  Recent Labs Lab 07/01/16 1330 07/01/16 1347 07/02/16 0412  WBC 13.4*  --  12.8*  NEUTROABS 10.0*  --   --  HGB 20.4* 21.4* 16.5*  HCT 56.8* 63.0* 45.9  MCV 83.9  --  84.2  PLT 328  --  101   Basic Metabolic Panel:  Recent Labs Lab 07/01/16 1330 07/01/16 1347 07/01/16 2017 07/02/16 0412  NA 129* 128*  --  131*  K 4.5 4.5  --  4.3  CL 93* 98*  --  100*  CO2 20*  --   --  23  GLUCOSE 183* 181*  --  145*  BUN 47* 53*  --  45*  CREATININE 2.95* 2.70*  --  1.85*  CALCIUM 10.8*  --   --  8.9  MG  --   --  1.8  --   PHOS  --   --  5.7*  --    GFR: Estimated Creatinine Clearance: 25.7 mL/min (A) (by C-G formula based on SCr of 1.85 mg/dL (H)). Liver Function  Tests:  Recent Labs Lab 07/01/16 1330  AST 28  ALT 31  ALKPHOS 79  BILITOT 1.2  PROT 8.4*  ALBUMIN 4.7    Recent Labs Lab 07/01/16 1330  LIPASE 41   No results for input(s): AMMONIA in the last 168 hours. Coagulation Profile:  Recent Labs Lab 07/01/16 2017  INR 1.04   Cardiac Enzymes:  Recent Labs Lab 07/01/16 2017 07/01/16 2211 07/02/16 0412  TROPONINI 0.06* 0.07* 0.07*   BNP (last 3 results) No results for input(s): PROBNP in the last 8760 hours. HbA1C: No results for input(s): HGBA1C in the last 72 hours. CBG:  Recent Labs Lab 07/01/16 1751 07/01/16 2004 07/02/16 0801 07/02/16 1249  GLUCAP 134* 165* 114* 122*   Lipid Profile: No results for input(s): CHOL, HDL, LDLCALC, TRIG, CHOLHDL, LDLDIRECT in the last 72 hours. Thyroid Function Tests:  Recent Labs  07/01/16 2017  TSH 1.230   Anemia Panel:  Recent Labs  07/01/16 2017  VITAMINB12 674   Sepsis Labs:  Recent Labs Lab 07/01/16 1348 07/01/16 1757  LATICACIDVEN 1.04 0.76    No results found for this or any previous visit (from the past 240 hour(s)).       Radiology Studies: Ct Abdomen Pelvis Wo Contrast  Result Date: 07/01/2016 CLINICAL DATA:  Diffuse pain all over. Nausea. Weight loss. Difficulty eating. EXAM: CT ABDOMEN AND PELVIS WITHOUT CONTRAST TECHNIQUE: Multidetector CT imaging of the abdomen and pelvis was performed following the standard protocol without IV contrast. COMPARISON:  CT of the abdomen and pelvis 06/02/2016 FINDINGS: Lower chest: The lung bases are mildly degraded by patient motion. No focal nodule, mass, or airspace disease is present. Hepatobiliary: No focal liver abnormality is seen. Status post cholecystectomy. No biliary dilatation. Pancreas: Unremarkable. No pancreatic ductal dilatation or surrounding inflammatory changes. Spleen: Normal in size without focal abnormality. Adrenals/Urinary Tract: Adrenal glands are normal bilaterally. Kidneys and ureters are  within normal limits. There is no stone or hydronephrosis. The urinary bladder is within normal limits. Stomach/Bowel: The stomach and duodenum are within normal limits. Small bowel is unremarkable. Appendix is visualized and normal. The ascending and transverse colon are within normal limits. The descending and sigmoid colon are unremarkable. Vascular/Lymphatic: Aortic atherosclerosis. No enlarged abdominal or pelvic lymph nodes. Reproductive: Uterus and bilateral adnexa are unremarkable. Other: No abdominal wall hernia or abnormality. No abdominopelvic ascites. Musculoskeletal: Multilevel facet degenerative changes are present in the lumbar spine. These are most pronounced at L2-3 and L3-4 with slight anterolisthesis is present. Vertebral body heights alignment contained. There quickly blastic lesions. The bony pelvis is intact. Left total hip arthroplasty  is noted. IMPRESSION: 1. No acute or focal lesion to explain the patient's diffuse abdominal pain. 2. Cholecystectomy. 3. Atherosclerosis without aneurysm. 4. Mild degenerative changes of the lumbar spine are likely within normal limits for age. Electronically Signed   By: San Morelle M.D.   On: 07/01/2016 15:33   Ct Head Wo Contrast  Result Date: 07/01/2016 CLINICAL DATA:  71 year old female with confusion and hypotension. EXAM: CT HEAD WITHOUT CONTRAST TECHNIQUE: Contiguous axial images were obtained from the base of the skull through the vertex without intravenous contrast. COMPARISON:  Head CT without contrast 06/02/2016 and earlier FINDINGS: Brain: Stable cerebral volume. No midline shift, ventriculomegaly, mass effect, evidence of mass lesion, intracranial hemorrhage or evidence of cortically based acute infarction. Gray-white matter differentiation is within normal limits throughout the brain. Vascular: Calcified atherosclerosis at the skull base. No suspicious intracranial vascular hyperdensity. Skull: Stable and negative. No acute osseous  abnormality identified. Sinuses/Orbits: Visualized paranasal sinuses and mastoids are stable and well pneumatized. Other: Stable and negative orbit and scalp soft tissues. IMPRESSION: Stable and negative for age noncontrast CT appearance of the brain. Electronically Signed   By: Genevie Ann M.D.   On: 07/01/2016 17:43   Portable Chest 1 View  Result Date: 07/01/2016 CLINICAL DATA:  Encephalopathy EXAM: PORTABLE CHEST 1 VIEW COMPARISON:  Portable exam 1620 hours compared to 10/13/2015 FINDINGS: Intraspinal stimulator projects over the upper thoracic spine at T3-T5. Prior cervical fusion. Normal heart size, mediastinal contours, and pulmonary vascularity. Lungs emphysematous but clear. No pulmonary infiltrate, pleural effusion or pneumothorax. Bones demineralized. IMPRESSION: Probable COPD changes without acute infiltrate. Electronically Signed   By: Lavonia Dana M.D.   On: 07/01/2016 16:28    Scheduled Meds: . docusate sodium  100 mg Oral BID  . enoxaparin (LOVENOX) injection  30 mg Subcutaneous Q24H  . insulin aspart  0-5 Units Subcutaneous QHS  . insulin aspart  0-9 Units Subcutaneous TID WC  . levothyroxine  75 mcg Oral QAC breakfast   Continuous Infusions:   LOS: 0 days    Time spent: > 35 minutes  Velvet Bathe, MD [facility] Pager [phone]  If 7PM-7AM, please contact night-coverage [email or web address] Password [facility] 07/02/2016, 4:05 PM

## 2016-07-02 NOTE — Progress Notes (Addendum)
Progress Note  Patient Name: Destiny Hughes Date of Encounter: 07/02/2016    Subjective   No complaints  Inpatient Medications    Scheduled Meds: . digoxin  250 mcg Oral q morning - 10a  . docusate sodium  100 mg Oral BID  . enoxaparin (LOVENOX) injection  30 mg Subcutaneous Q24H  . insulin aspart  0-5 Units Subcutaneous QHS  . insulin aspart  0-9 Units Subcutaneous TID WC  . levothyroxine  75 mcg Oral QAC breakfast  . ondansetron (ZOFRAN) IV  4 mg Intravenous Q6H   Continuous Infusions:  PRN Meds: acetaminophen **OR** acetaminophen, hydrALAZINE, morphine injection, ondansetron **OR** ondansetron (ZOFRAN) IV, oxyCODONE-acetaminophen   Vital Signs    Vitals:   07/01/16 1900 07/01/16 2001 07/02/16 0558 07/02/16 0614  BP: 127/64 115/64 (!) 111/58   Pulse: (!) 59 (!) 53 63   Resp: 17 18 18    Temp:  97.4 F (36.3 C) 98.2 F (36.8 C)   TempSrc:  Oral    SpO2: 93% 98% 95%   Weight:  138 lb 7.2 oz (62.8 kg)  138 lb 7.2 oz (62.8 kg)  Height:  5\' 6"  (1.676 m)      Intake/Output Summary (Last 24 hours) at 07/02/16 0829 Last data filed at 07/02/16 0502  Gross per 24 hour  Intake             2520 ml  Output                0 ml  Net             2520 ml   Filed Weights   07/01/16 2001 07/02/16 0614  Weight: 138 lb 7.2 oz (62.8 kg) 138 lb 7.2 oz (62.8 kg)    Telemetry     - Personally Reviewed  ECG    - Personally Reviewed  Physical Exam   Vitals:   07/01/16 2001 07/02/16 0558  BP: 115/64 (!) 111/58  Pulse: (!) 53 63  Resp: 18 18  Temp: 97.4 F (36.3 C) 98.2 F (36.8 C)   .vitals2  GEN: No acute distress.   Neck: No JVD Cardiac: RRR, no murmurs, rubs, or gallops.  Respiratory: Clear to auscultation bilaterally. GI: Soft, nontender, non-distended  MS: No edema; No deformity. Neuro:  Nonfocal  Psych: Normal affect   Labs    Chemistry Recent Labs Lab 07/01/16 1330 07/01/16 1347 07/02/16 0412  NA 129* 128* 131*  K 4.5 4.5 4.3  CL 93*  98* 100*  CO2 20*  --  23  GLUCOSE 183* 181* 145*  BUN 47* 53* 45*  CREATININE 2.95* 2.70* 1.85*  CALCIUM 10.8*  --  8.9  PROT 8.4*  --   --   ALBUMIN 4.7  --   --   AST 28  --   --   ALT 31  --   --   ALKPHOS 79  --   --   BILITOT 1.2  --   --   GFRNONAA 15*  --  26*  GFRAA 17*  --  30*  ANIONGAP 16*  --  8     Hematology Recent Labs Lab 07/01/16 1330 07/01/16 1347 07/02/16 0412  WBC 13.4*  --  12.8*  RBC 6.77*  --  5.45*  HGB 20.4* 21.4* 16.5*  HCT 56.8* 63.0* 45.9  MCV 83.9  --  84.2  MCH 30.1  --  30.3  MCHC 35.9  --  35.9  RDW 13.8  --  14.0  PLT 328  --  270    Cardiac Enzymes Recent Labs Lab 07/01/16 2017 07/01/16 2211 07/02/16 0412  TROPONINI 0.06* 0.07* 0.07*   No results for input(s): TROPIPOC in the last 168 hours.   BNP Recent Labs Lab 07/01/16 1830  BNP 173.1*     DDimer No results for input(s): DDIMER in the last 168 hours.   Radiology    Ct Abdomen Pelvis Wo Contrast  Result Date: 07/01/2016 CLINICAL DATA:  Diffuse pain all over. Nausea. Weight loss. Difficulty eating. EXAM: CT ABDOMEN AND PELVIS WITHOUT CONTRAST TECHNIQUE: Multidetector CT imaging of the abdomen and pelvis was performed following the standard protocol without IV contrast. COMPARISON:  CT of the abdomen and pelvis 06/02/2016 FINDINGS: Lower chest: The lung bases are mildly degraded by patient motion. No focal nodule, mass, or airspace disease is present. Hepatobiliary: No focal liver abnormality is seen. Status post cholecystectomy. No biliary dilatation. Pancreas: Unremarkable. No pancreatic ductal dilatation or surrounding inflammatory changes. Spleen: Normal in size without focal abnormality. Adrenals/Urinary Tract: Adrenal glands are normal bilaterally. Kidneys and ureters are within normal limits. There is no stone or hydronephrosis. The urinary bladder is within normal limits. Stomach/Bowel: The stomach and duodenum are within normal limits. Small bowel is unremarkable.  Appendix is visualized and normal. The ascending and transverse colon are within normal limits. The descending and sigmoid colon are unremarkable. Vascular/Lymphatic: Aortic atherosclerosis. No enlarged abdominal or pelvic lymph nodes. Reproductive: Uterus and bilateral adnexa are unremarkable. Other: No abdominal wall hernia or abnormality. No abdominopelvic ascites. Musculoskeletal: Multilevel facet degenerative changes are present in the lumbar spine. These are most pronounced at L2-3 and L3-4 with slight anterolisthesis is present. Vertebral body heights alignment contained. There quickly blastic lesions. The bony pelvis is intact. Left total hip arthroplasty is noted. IMPRESSION: 1. No acute or focal lesion to explain the patient's diffuse abdominal pain. 2. Cholecystectomy. 3. Atherosclerosis without aneurysm. 4. Mild degenerative changes of the lumbar spine are likely within normal limits for age. Electronically Signed   By: San Morelle M.D.   On: 07/01/2016 15:33   Ct Head Wo Contrast  Result Date: 07/01/2016 CLINICAL DATA:  71 year old female with confusion and hypotension. EXAM: CT HEAD WITHOUT CONTRAST TECHNIQUE: Contiguous axial images were obtained from the base of the skull through the vertex without intravenous contrast. COMPARISON:  Head CT without contrast 06/02/2016 and earlier FINDINGS: Brain: Stable cerebral volume. No midline shift, ventriculomegaly, mass effect, evidence of mass lesion, intracranial hemorrhage or evidence of cortically based acute infarction. Gray-white matter differentiation is within normal limits throughout the brain. Vascular: Calcified atherosclerosis at the skull base. No suspicious intracranial vascular hyperdensity. Skull: Stable and negative. No acute osseous abnormality identified. Sinuses/Orbits: Visualized paranasal sinuses and mastoids are stable and well pneumatized. Other: Stable and negative orbit and scalp soft tissues. IMPRESSION: Stable and  negative for age noncontrast CT appearance of the brain. Electronically Signed   By: Genevie Ann M.D.   On: 07/01/2016 17:43   Portable Chest 1 View  Result Date: 07/01/2016 CLINICAL DATA:  Encephalopathy EXAM: PORTABLE CHEST 1 VIEW COMPARISON:  Portable exam 1620 hours compared to 10/13/2015 FINDINGS: Intraspinal stimulator projects over the upper thoracic spine at T3-T5. Prior cervical fusion. Normal heart size, mediastinal contours, and pulmonary vascularity. Lungs emphysematous but clear. No pulmonary infiltrate, pleural effusion or pneumothorax. Bones demineralized. IMPRESSION: Probable COPD changes without acute infiltrate. Electronically Signed   By: Lavonia Dana M.D.   On: 07/01/2016 16:28    Cardiac Studies  Patient Profile    Assessment & Plan    1. Bradycardia - digoxin and propanolol are being held. No tele overnight but by vitals rates improving, will place on tele today.  - from chart review unclear indication for digoxin, appears due to history of palpitations.. D/c at this time given bradycaria and severe AKI, likely will not restart. Follow heart rates and bp today, likely resume propanolol tomorrow.   2. Abnormal EKG - chronic inferior and lateral precordial ST depressions. - negative nuclear stress June 2017 - 08/2015 echo LVEF 60-65%, grade II diastolic dysfunction, mild MR  3. AKI - Cr trending down with IVFs  4. Elevated troponin - mild fairly flat troponin around 0.06 to 0.07. Nonspecific elevation in patient admitted hypotensive 70s/50s with severe AKI. Chronic ST/T changes on EKG - negative stress last year. We will repeat echo. At this time would not pursue ischemic testing, continue to follow trop trend.   4. Hypotension - bp's have imrpoved with IVFs alone. In combination with her labs findings (AKI, hemoconcentation, hyponatremia) likely hypovolemic hypotension - hold lisionpril,propanol at this time.   Merrily Pew, MD  07/02/2016, 8:29 AM

## 2016-07-02 NOTE — Care Management Note (Signed)
Case Management Note  Patient Details  Name: Destiny Hughes MRN: [id number] Date of Birth: 23-Jul-1945  Subjective/Objective:                 Spoke with patient and spouse. Provided choice and would like to use [facility] for [facility] PT. They declined need for DME. Referral made to [facility] with [facility].    Action/Plan:  DC to home w [facility] PT Expected Discharge Date:  07/02/16               Expected Discharge Plan:  [place]  In-House Referral:     Discharge planning Services  CM Consult  Post Acute Care Choice:  NA Choice offered to:  Patient, Spouse  DME Arranged:    DME Agency:     HH Arranged:  PT [place]:  [place]  Status of Service:  Completed, signed off  If discussed at [place] of Stay Meetings, dates discussed:    Additional Comments:  Carles Collet, RN 07/02/2016, 4:09 PM

## 2016-07-03 ENCOUNTER — Encounter (HOSPITAL_COMMUNITY): Payer: Self-pay | Admitting: *Deleted

## 2016-07-03 ENCOUNTER — Inpatient Hospital Stay (HOSPITAL_COMMUNITY): Payer: Medicare Other

## 2016-07-03 LAB — URINE CULTURE

## 2016-07-03 LAB — TROPONIN I: Troponin I: 0.03 ng/mL (ref <0.03)

## 2016-07-03 LAB — BASIC METABOLIC PANEL
Anion gap: 7 (ref 5–15)
BUN: 24 mg/dL — AB (ref 6–20)
CALCIUM: 8.9 mg/dL (ref 8.9–10.3)
CHLORIDE: 105 mmol/L (ref 101–111)
CO2: 23 mmol/L (ref 22–32)
CREATININE: 1.01 mg/dL — AB (ref 0.44–1.00)
GFR calc non Af Amer: 54 mL/min — ABNORMAL LOW (ref >60)
Glucose, Bld: 96 mg/dL (ref 65–99)
Potassium: 3.9 mmol/L (ref 3.5–5.1)
SODIUM: 135 mmol/L (ref 135–145)

## 2016-07-03 LAB — GLUCOSE, CAPILLARY
GLUCOSE-CAPILLARY: 106 mg/dL — AB (ref 65–99)
Glucose-Capillary: 101 mg/dL — ABNORMAL HIGH (ref 65–99)

## 2016-07-03 MED ORDER — PROPRANOLOL HCL 20 MG PO TABS: 20.0000 mg | ORAL_TABLET | Freq: Two times a day (BID) | ORAL | 0 refills | Status: DC | PRN

## 2016-07-03 NOTE — Care Management Note (Signed)
Case Management Note Original Note created by Carles Collet  Patient Details  Name: Destiny Hughes MRN: [id number] Date of Birth: 10/30/1945  Subjective/Objective:                 Spoke with patient and spouse. Provided choice and would like to use [facility] for [facility] PT. They declined need for DME. Referral made to [facility] with [facility].    Action/Plan:  DC to home w [facility] PT Expected Discharge Date:  07/03/16               Expected Discharge Plan:  [place]  In-House Referral:     Discharge planning Services  CM Consult  Post Acute Care Choice:  NA Choice offered to:  Patient, Spouse  DME Arranged:    DME Agency:     HH Arranged:  PT, RN [place] Agency:  [place]  Status of Service:  Completed, signed off  If discussed at [place] of Stay Meetings, dates discussed:    Additional Comments: Pt assessed prior to discharge.  Spouse requested [facility] - MD ordered, [place] contacted and second referral accepted - informed that pt is discharging today Maryclare Labrador, RN 07/03/2016, 2:17 PM

## 2016-07-03 NOTE — Discharge Summary (Signed)
Physician Discharge Summary  Destiny Hughes ZOX:[id number] DOB: Jun 03, 1945 DOA: 07/01/2016  PCP: Minette Brine  Admit date: 07/01/2016 Discharge date: 07/03/2016  Time spent: > 35 minutes  Recommendations for Outpatient Follow-up:  1. Reassess serum creatinine 2. Decide when to continue lasix and other blood pressure medication 3. Propranolol is to be administered prn palpitations 4. Digoxin held due to bradycardia   Discharge Diagnoses:  Principal Problem:   Acute kidney injury [facility]) Active Problems:   Chronic neck pain   Essential hypertension   COPD (chronic obstructive pulmonary disease) (HCC)   Hypothyroidism   GERD (gastroesophageal reflux disease)   Diastolic CHF (HCC)   Hyperglycemia   Hyponatremia   Intractable nausea and vomiting   Abnormal EKG   AKI (acute kidney injury) ([place])   Discharge Condition: stable  Diet recommendation: carb modified diet (discussed with patient)  Filed Weights   07/01/16 2001 07/02/16 0614 07/03/16 0436  Weight: 62.8 kg (138 lb 7.2 oz) 62.8 kg (138 lb 7.2 oz) 62.6 kg (138 lb)    History of present illness:   71 y.o. female with a Past Medical History significant for COPD, hypertension, hyperlipidemia, low thyroid who presents with AMS. On my exam patient is weak and tired. Likely multifactorial from dehydration and acute kidney injury.   Hospital Course:  Principal Problem:   Acute kidney injury ([place]) - improved after Holding nephrotoxic agents and improved oral intake. We'll continue to hold Lasix on discharge  Active Problems:   Chronic neck pain - Stable on home medication regimen will continue  Bradycardia -Cardiology consulted - Cardiology recommending holding digoxin on discharge and providing propranolol for palpitations as needed    Essential hypertension - Soft blood pressures secondary to decreased intravascular volume continue to hold home antihypertensive regimen on discharge. Will defer to primary care  physician or cardiologist to resume after discharge.    COPD (chronic obstructive pulmonary disease) (HCC) - Compensated currently    Hypothyroidism - Stable on Synthroid    GERD (gastroesophageal reflux disease) - Stable no new complaints    Diastolic CHF (HCC) - Improved with gentle IV fluid rehydration    Hyperglycemia - Continue carb modified diet and have requested patient follow-up with primary care physician for further evaluation recommendations.    Hyponatremia - Resolved with normal saline IV fluid rehydration    Intractable nausea and vomiting - Resolved   Procedures:  None  Consultations:  Cardiology for bradycardia  Discharge Exam: Vitals:   07/03/16 0218 07/03/16 0505  BP: (!) 118/56 114/61  Pulse: 73 78  Resp: 18 18  Temp: 99 F (37.2 C) 97.6 F (36.4 C)    General: Patient in no acute distress, alert and awake Cardiovascular: Regular rate and rhythm, no murmurs or rubs Respiratory: No increased work of breathing or wheezing  Discharge Instructions   Discharge Instructions    Call MD for:  extreme fatigue    Complete by:  As directed    Call MD for:  temperature >100.4    Complete by:  As directed    Diet - low sodium heart healthy    Complete by:  As directed    Diet - low sodium heart healthy    Complete by:  As directed    Discharge instructions    Complete by:  As directed    Please follow up with your cardiologist.  Call their office after hospital discharge to confirm appointment date and time.   Increase activity slowly    Complete by:  As directed    Increase activity slowly    Complete by:  As directed      Current Discharge Medication List    CONTINUE these medications which have CHANGED   Details  propranolol (INDERAL) 20 MG tablet Take 1 tablet (20 mg total) by mouth 2 (two) times daily as needed (palpitations). Qty: 30 tablet, Refills: 0      CONTINUE these medications which have NOT CHANGED   Details   levothyroxine (SYNTHROID, LEVOTHROID) 150 MCG tablet TAKE 1 TO 1+1/2 TABLETS DAILY AS DIRECTED Qty: 135 tablet, Refills: 1   Associated Diagnoses: Hypothyroidism    Multiple Vitamins-Minerals (ALIVE WOMENS 50+) TABS Take 1 tablet by mouth daily.    methadone (DOLOPHINE) 5 MG tablet Take 5 mg by mouth every 6 (six) hours.     oxyCODONE (ROXICODONE) 15 MG immediate release tablet Take 1 tablet by mouth every 6 (six) hours as needed for pain.  Refills: 0    sertraline (ZOLOFT) 100 MG tablet Take 100 mg by mouth at bedtime.       STOP taking these medications     digoxin (DIGOX) 0.25 MG tablet      furosemide (LASIX) 20 MG tablet      lisinopril (PRINIVIL,ZESTRIL) 10 MG tablet        Allergies  Allergen Reactions  . Albuterol Other (See Comments)    Had difficulty breathing after a nebulizer treatment  . Penicillins Anaphylaxis and Swelling    Has patient had a PCN reaction causing immediate rash, facial/tongue/throat swelling, SOB or lightheadedness with hypotension: Yes Has patient had a PCN reaction causing severe rash involving mucus membranes or skin necrosis: Rash Has patient had a PCN reaction that required hospitalization: No Has patient had a PCN reaction occurring within the last 10 years: No If all of the above answers are "NO", then may proceed with Cephalosporin use.  [name] [Kdc:Albuterol] Anaphylaxis  . Sulfa Antibiotics Swelling    Throat swells, but no shortness of breath noted  . Zanaflex [Tizanidine] Other (See Comments)    Possible confusion (??)  . Codeine Nausea And Vomiting  . Ecotrin [Aspirin] Nausea And Vomiting   Follow-up Information    [facility] Follow up.   Why:  You will be contacted in 1-2 days to set up your first home health visit.  Contact information: [address] [place] [address] [phone]            The results of significant diagnostics from this hospitalization (including imaging,  microbiology, ancillary and laboratory) are listed below for reference.    Significant Diagnostic Studies: Ct Abdomen Pelvis Wo Contrast  Result Date: 07/01/2016 CLINICAL DATA:  Diffuse pain all over. Nausea. Weight loss. Difficulty eating. EXAM: CT ABDOMEN AND PELVIS WITHOUT CONTRAST TECHNIQUE: Multidetector CT imaging of the abdomen and pelvis was performed following the standard protocol without IV contrast. COMPARISON:  CT of the abdomen and pelvis 06/02/2016 FINDINGS: Lower chest: The lung bases are mildly degraded by patient motion. No focal nodule, mass, or airspace disease is present. Hepatobiliary: No focal liver abnormality is seen. Status post cholecystectomy. No biliary dilatation. Pancreas: Unremarkable. No pancreatic ductal dilatation or surrounding inflammatory changes. Spleen: Normal in size without focal abnormality. Adrenals/Urinary Tract: Adrenal glands are normal bilaterally. Kidneys and ureters are within normal limits. There is no stone or hydronephrosis. The urinary bladder is within normal limits. Stomach/Bowel: The stomach and duodenum are within normal limits. Small bowel is unremarkable. Appendix is visualized and  normal. The ascending and transverse colon are within normal limits. The descending and sigmoid colon are unremarkable. Vascular/Lymphatic: Aortic atherosclerosis. No enlarged abdominal or pelvic lymph nodes. Reproductive: Uterus and bilateral adnexa are unremarkable. Other: No abdominal wall hernia or abnormality. No abdominopelvic ascites. Musculoskeletal: Multilevel facet degenerative changes are present in the lumbar spine. These are most pronounced at L2-3 and L3-4 with slight anterolisthesis is present. Vertebral body heights alignment contained. There quickly blastic lesions. The bony pelvis is intact. Left total hip arthroplasty is noted. IMPRESSION: 1. No acute or focal lesion to explain the patient's diffuse abdominal pain. 2. Cholecystectomy. 3. Atherosclerosis  without aneurysm. 4. Mild degenerative changes of the lumbar spine are likely within normal limits for age. Electronically Signed   By: San Morelle M.D.   On: 07/01/2016 15:33   Ct Head Wo Contrast  Result Date: 07/01/2016 CLINICAL DATA:  71 year old female with confusion and hypotension. EXAM: CT HEAD WITHOUT CONTRAST TECHNIQUE: Contiguous axial images were obtained from the base of the skull through the vertex without intravenous contrast. COMPARISON:  Head CT without contrast 06/02/2016 and earlier FINDINGS: Brain: Stable cerebral volume. No midline shift, ventriculomegaly, mass effect, evidence of mass lesion, intracranial hemorrhage or evidence of cortically based acute infarction. Gray-white matter differentiation is within normal limits throughout the brain. Vascular: Calcified atherosclerosis at the skull base. No suspicious intracranial vascular hyperdensity. Skull: Stable and negative. No acute osseous abnormality identified. Sinuses/Orbits: Visualized paranasal sinuses and mastoids are stable and well pneumatized. Other: Stable and negative orbit and scalp soft tissues. IMPRESSION: Stable and negative for age noncontrast CT appearance of the brain. Electronically Signed   By: Genevie Ann M.D.   On: 07/01/2016 17:43   Portable Chest 1 View  Result Date: 07/01/2016 CLINICAL DATA:  Encephalopathy EXAM: PORTABLE CHEST 1 VIEW COMPARISON:  Portable exam 1620 hours compared to 10/13/2015 FINDINGS: Intraspinal stimulator projects over the upper thoracic spine at T3-T5. Prior cervical fusion. Normal heart size, mediastinal contours, and pulmonary vascularity. Lungs emphysematous but clear. No pulmonary infiltrate, pleural effusion or pneumothorax. Bones demineralized. IMPRESSION: Probable COPD changes without acute infiltrate. Electronically Signed   By: Lavonia Dana M.D.   On: 07/01/2016 16:28    Microbiology: Recent Results (from the past 240 hour(s))  Culture, Urine     Status: Abnormal    Collection Time: 07/01/16  3:32 AM  Result Value Ref Range Status   Specimen Description URINE, RANDOM  Final   Special Requests NONE  Final   Culture MULTIPLE SPECIES PRESENT, SUGGEST RECOLLECTION (A)  Final   Report Status 07/03/2016 FINAL  Final     Labs: Basic Metabolic Panel:  Recent Labs Lab 07/01/16 1330 07/01/16 1347 07/01/16 2017 07/02/16 0412 07/03/16 0637  NA 129* 128*  --  131* 135  K 4.5 4.5  --  4.3 3.9  CL 93* 98*  --  100* 105  CO2 20*  --   --  23 23  GLUCOSE 183* 181*  --  145* 96  BUN 47* 53*  --  45* 24*  CREATININE 2.95* 2.70*  --  1.85* 1.01*  CALCIUM 10.8*  --   --  8.9 8.9  MG  --   --  1.8  --   --   PHOS  --   --  5.7*  --   --    Liver Function Tests:  Recent Labs Lab 07/01/16 1330  AST 28  ALT 31  ALKPHOS 79  BILITOT 1.2  PROT 8.4*  ALBUMIN 4.7  Recent Labs Lab 07/01/16 1330  LIPASE 41   No results for input(s): AMMONIA in the last 168 hours. CBC:  Recent Labs Lab 07/01/16 1330 07/01/16 1347 07/02/16 0412  WBC 13.4*  --  12.8*  NEUTROABS 10.0*  --   --   HGB 20.4* 21.4* 16.5*  HCT 56.8* 63.0* 45.9  MCV 83.9  --  84.2  PLT 328  --  270   Cardiac Enzymes:  Recent Labs Lab 07/01/16 2017 07/01/16 2211 07/02/16 0412 07/03/16 0637  TROPONINI 0.06* 0.07* 0.07* 0.03*   BNP: BNP (last 3 results)  Recent Labs  07/01/16 1830  BNP 173.1*    ProBNP (last 3 results) No results for input(s): PROBNP in the last 8760 hours.  CBG:  Recent Labs Lab 07/02/16 1249 07/02/16 1709 07/02/16 2107 07/03/16 0838 07/03/16 1241  GLUCAP 122* 128* 127* 101* 106*    Signed:  Velvet Bathe MD.  [facility] 07/03/2016, 1:28 PM

## 2016-07-03 NOTE — Progress Notes (Signed)
Nsg Discharge Note  Admit Date:  07/01/2016 Discharge date: 07/03/2016   Kathlynn Grate Cimo to be D/C'd Home per MD order.  AVS completed.  Copy for chart, and copy for patient signed, and dated. Patient/caregiver able to verbalize understanding.  Discharge Medication: Allergies as of 07/03/2016      Reactions   Albuterol Other (See Comments)   Had difficulty breathing after a nebulizer treatment   Penicillins Anaphylaxis, Swelling   Has patient had a PCN reaction causing immediate rash, facial/tongue/throat swelling, SOB or lightheadedness with hypotension: Yes Has patient had a PCN reaction causing severe rash involving mucus membranes or skin necrosis: Rash Has patient had a PCN reaction that required hospitalization: No Has patient had a PCN reaction occurring within the last 10 years: No If all of the above answers are "NO", then may proceed with Cephalosporin use.   Ventolin [kdc:albuterol] Anaphylaxis   Sulfa Antibiotics Swelling   Throat swells, but no shortness of breath noted   Zanaflex [tizanidine] Other (See Comments)   Possible confusion (??)   Codeine Nausea And Vomiting   Ecotrin [aspirin] Nausea And Vomiting      Medication List    STOP taking these medications   digoxin 0.25 MG tablet Commonly known as:  DIGOX   furosemide 20 MG tablet Commonly known as:  LASIX   lisinopril 10 MG tablet Commonly known as:  PRINIVIL,ZESTRIL     TAKE these medications   ALIVE WOMENS 50+ Tabs Take 1 tablet by mouth daily.   levothyroxine 150 MCG tablet Commonly known as:  SYNTHROID, LEVOTHROID TAKE 1 TO 1+1/2 TABLETS DAILY AS DIRECTED What changed:  See the new instructions.   methadone 5 MG tablet Commonly known as:  DOLOPHINE Take 5 mg by mouth every 6 (six) hours.   oxyCODONE 15 MG immediate release tablet Commonly known as:  ROXICODONE Take 1 tablet by mouth every 6 (six) hours as needed for pain.   propranolol 20 MG tablet Commonly known as:  INDERAL Take 1  tablet (20 mg total) by mouth 2 (two) times daily as needed (palpitations). What changed:  medication strength  how much to take  when to take this  reasons to take this   sertraline 100 MG tablet Commonly known as:  ZOLOFT Take 100 mg by mouth at bedtime.       Discharge Assessment: Vitals:   07/03/16 0218 07/03/16 0505  BP: (!) 118/56 114/61  Pulse: 73 78  Resp: 18 18  Temp: 99 F (37.2 C) 97.6 F (36.4 C)   Skin clean, dry and intact without evidence of skin break down, no evidence of skin tears noted. IV catheter discontinued intact. Site without signs and symptoms of complications - no redness or edema noted at insertion site, patient denies c/o pain - only slight tenderness at site.  Dressing with slight pressure applied.  D/c Instructions-Education: Discharge instructions given to patient/family with verbalized understanding. D/c education completed with patient/family including follow up instructions, medication list, d/c activities limitations if indicated, with other d/c instructions as indicated by MD - patient able to verbalize understanding, all questions fully answered. Patient instructed to return to ED, call 911, or call MD for any changes in condition.  Patient escorted via [place], and D/C home via private auto.  Salley Slaughter, RN 07/03/2016 1:42 PM

## 2016-07-03 NOTE — Progress Notes (Signed)
Progress Note  Patient Name: Destiny Hughes Date of Encounter: 07/03/2016    Subjective   No palpitations.   Inpatient Medications    Scheduled Meds: . docusate sodium  100 mg Oral BID  . enoxaparin (LOVENOX) injection  30 mg Subcutaneous Q24H  . insulin aspart  0-5 Units Subcutaneous QHS  . insulin aspart  0-9 Units Subcutaneous TID WC  . levothyroxine  75 mcg Oral QAC breakfast   Continuous Infusions:  PRN Meds: acetaminophen **OR** acetaminophen, hydrALAZINE, morphine injection, ondansetron **OR** ondansetron (ZOFRAN) IV, oxyCODONE-acetaminophen   Vital Signs    Vitals:   07/03/16 0016 07/03/16 0218 07/03/16 0436 07/03/16 0505  BP: (!) 131/58 (!) 118/56  114/61  Pulse: 67 73  78  Resp: 18 18  18   Temp: 98.2 F (36.8 C) 99 F (37.2 C)  97.6 F (36.4 C)  TempSrc:      SpO2: 96% 94%  95%  Weight:   138 lb (62.6 kg)   Height:        Intake/Output Summary (Last 24 hours) at 07/03/16 0726 Last data filed at 07/03/16 0157  Gross per 24 hour  Intake              510 ml  Output              700 ml  Net             -190 ml   Filed Weights   07/01/16 2001 07/02/16 0614 07/03/16 0436  Weight: 138 lb 7.2 oz (62.8 kg) 138 lb 7.2 oz (62.8 kg) 138 lb (62.6 kg)    Telemetry     - Personally Reviewed  ECG    - Personally Reviewed  Physical Exam   Vitals:   07/03/16 0218 07/03/16 0505  BP: (!) 118/56 114/61  Pulse: 73 78  Resp: 18 18  Temp: 99 F (37.2 C) 97.6 F (36.4 C)     GEN: No acute distress.   Neck: No JVD Cardiac: RRR, no murmurs, rubs, or gallops.  Respiratory: Clear to auscultation bilaterally. GI: Soft, nontender, non-distended  MS: No edema; No deformity. Neuro:  Nonfocal  Psych: Normal affect   Labs    Chemistry  Recent Labs Lab 07/01/16 1330 07/01/16 1347 07/02/16 0412  NA 129* 128* 131*  K 4.5 4.5 4.3  CL 93* 98* 100*  CO2 20*  --  23  GLUCOSE 183* 181* 145*  BUN 47* 53* 45*  CREATININE 2.95* 2.70* 1.85*    CALCIUM 10.8*  --  8.9  PROT 8.4*  --   --   ALBUMIN 4.7  --   --   AST 28  --   --   ALT 31  --   --   ALKPHOS 79  --   --   BILITOT 1.2  --   --   GFRNONAA 15*  --  26*  GFRAA 17*  --  30*  ANIONGAP 16*  --  8     Hematology  Recent Labs Lab 07/01/16 1330 07/01/16 1347 07/02/16 0412  WBC 13.4*  --  12.8*  RBC 6.77*  --  5.45*  HGB 20.4* 21.4* 16.5*  HCT 56.8* 63.0* 45.9  MCV 83.9  --  84.2  MCH 30.1  --  30.3  MCHC 35.9  --  35.9  RDW 13.8  --  14.0  PLT 328  --  270    Cardiac Enzymes  Recent Labs Lab 07/01/16 2017 07/01/16 2211 07/02/16  0412  TROPONINI 0.06* 0.07* 0.07*   No results for input(s): TROPIPOC in the last 168 hours.   BNP  Recent Labs Lab 07/01/16 1830  BNP 173.1*     DDimer No results for input(s): DDIMER in the last 168 hours.   Radiology    Ct Abdomen Pelvis Wo Contrast  Result Date: 07/01/2016 CLINICAL DATA:  Diffuse pain all over. Nausea. Weight loss. Difficulty eating. EXAM: CT ABDOMEN AND PELVIS WITHOUT CONTRAST TECHNIQUE: Multidetector CT imaging of the abdomen and pelvis was performed following the standard protocol without IV contrast. COMPARISON:  CT of the abdomen and pelvis 06/02/2016 FINDINGS: Lower chest: The lung bases are mildly degraded by patient motion. No focal nodule, mass, or airspace disease is present. Hepatobiliary: No focal liver abnormality is seen. Status post cholecystectomy. No biliary dilatation. Pancreas: Unremarkable. No pancreatic ductal dilatation or surrounding inflammatory changes. Spleen: Normal in size without focal abnormality. Adrenals/Urinary Tract: Adrenal glands are normal bilaterally. Kidneys and ureters are within normal limits. There is no stone or hydronephrosis. The urinary bladder is within normal limits. Stomach/Bowel: The stomach and duodenum are within normal limits. Small bowel is unremarkable. Appendix is visualized and normal. The ascending and transverse colon are within normal limits.  The descending and sigmoid colon are unremarkable. Vascular/Lymphatic: Aortic atherosclerosis. No enlarged abdominal or pelvic lymph nodes. Reproductive: Uterus and bilateral adnexa are unremarkable. Other: No abdominal wall hernia or abnormality. No abdominopelvic ascites. Musculoskeletal: Multilevel facet degenerative changes are present in the lumbar spine. These are most pronounced at L2-3 and L3-4 with slight anterolisthesis is present. Vertebral body heights alignment contained. There quickly blastic lesions. The bony pelvis is intact. Left total hip arthroplasty is noted. IMPRESSION: 1. No acute or focal lesion to explain the patient's diffuse abdominal pain. 2. Cholecystectomy. 3. Atherosclerosis without aneurysm. 4. Mild degenerative changes of the lumbar spine are likely within normal limits for age. Electronically Signed   By: San Morelle M.D.   On: 07/01/2016 15:33   Ct Head Wo Contrast  Result Date: 07/01/2016 CLINICAL DATA:  71 year old female with confusion and hypotension. EXAM: CT HEAD WITHOUT CONTRAST TECHNIQUE: Contiguous axial images were obtained from the base of the skull through the vertex without intravenous contrast. COMPARISON:  Head CT without contrast 06/02/2016 and earlier FINDINGS: Brain: Stable cerebral volume. No midline shift, ventriculomegaly, mass effect, evidence of mass lesion, intracranial hemorrhage or evidence of cortically based acute infarction. Gray-white matter differentiation is within normal limits throughout the brain. Vascular: Calcified atherosclerosis at the skull base. No suspicious intracranial vascular hyperdensity. Skull: Stable and negative. No acute osseous abnormality identified. Sinuses/Orbits: Visualized paranasal sinuses and mastoids are stable and well pneumatized. Other: Stable and negative orbit and scalp soft tissues. IMPRESSION: Stable and negative for age noncontrast CT appearance of the brain. Electronically Signed   By: Genevie Ann M.D.    On: 07/01/2016 17:43   Portable Chest 1 View  Result Date: 07/01/2016 CLINICAL DATA:  Encephalopathy EXAM: PORTABLE CHEST 1 VIEW COMPARISON:  Portable exam 1620 hours compared to 10/13/2015 FINDINGS: Intraspinal stimulator projects over the upper thoracic spine at T3-T5. Prior cervical fusion. Normal heart size, mediastinal contours, and pulmonary vascularity. Lungs emphysematous but clear. No pulmonary infiltrate, pleural effusion or pneumothorax. Bones demineralized. IMPRESSION: Probable COPD changes without acute infiltrate. Electronically Signed   By: Lavonia Dana M.D.   On: 07/01/2016 16:28    Cardiac Studies    Patient Profile   71 y.o. female history of palpitations on beta blocker and digoxin at  home admitted with bradycardia and hypotension.  Assessment & Plan    1. Bradycardia - digoxin and propanolol are being held.  - from chart review unclear indication for digoxin, appears due to history of palpitations.. D/c at this time given bradycaria and severe AKI, likely will not restart - heart rates low 60s, no ectopy or arrhythmias. She denies palpitations at this time. Soft bp's, will not restart propanolol at this time, continue to follow rates. May consider just prn propanolol at discharge.   2. Abnormal EKG - chronic inferior and lateral precordial ST depressions. - negative nuclear stress June 2017 - 08/2015 echo LVEF 60-65%, grade II diastolic dysfunction, mild MR  3. AKI - Cr trending down with IVFs  4. Elevated troponin - mild fairly flat troponin around 0.06 to 0.07. Nonspecific elevation in patient admitted hypotensive 70s/50s with severe AKI. Chronic ST/T changes on EKG - negative stress last year. F/u echo today.   At this time would not pursue ischemic testing  4. Hypotension - bp's have imrpoved with IVFs alone. In combination with her labs findings (AKI, hemoconcentation, hyponatremia) likely hypovolemic hypotension - still soft at times, continue to hold bp  meds.   Merrily Pew, MD  07/03/2016, 7:26 AM

## 2016-07-04 DIAGNOSIS — M542 Cervicalgia: Secondary | ICD-10-CM | POA: Diagnosis not present

## 2016-07-04 DIAGNOSIS — E039 Hypothyroidism, unspecified: Secondary | ICD-10-CM | POA: Diagnosis not present

## 2016-07-04 DIAGNOSIS — E785 Hyperlipidemia, unspecified: Secondary | ICD-10-CM | POA: Diagnosis not present

## 2016-07-04 DIAGNOSIS — I5030 Unspecified diastolic (congestive) heart failure: Secondary | ICD-10-CM | POA: Diagnosis not present

## 2016-07-04 DIAGNOSIS — F329 Major depressive disorder, single episode, unspecified: Secondary | ICD-10-CM | POA: Diagnosis not present

## 2016-07-04 DIAGNOSIS — J449 Chronic obstructive pulmonary disease, unspecified: Secondary | ICD-10-CM | POA: Diagnosis not present

## 2016-07-04 DIAGNOSIS — G894 Chronic pain syndrome: Secondary | ICD-10-CM | POA: Diagnosis not present

## 2016-07-04 DIAGNOSIS — K219 Gastro-esophageal reflux disease without esophagitis: Secondary | ICD-10-CM | POA: Diagnosis not present

## 2016-07-04 DIAGNOSIS — I110 Hypertensive heart disease with heart failure: Secondary | ICD-10-CM | POA: Diagnosis not present

## 2016-07-04 DIAGNOSIS — F17219 Nicotine dependence, cigarettes, with unspecified nicotine-induced disorders: Secondary | ICD-10-CM | POA: Diagnosis not present

## 2016-07-04 LAB — FOLATE RBC
FOLATE, HEMOLYSATE: 555.6 ng/mL
Folate, RBC: 1096 ng/mL (ref >498)
Hematocrit: 50.7 % — ABNORMAL HIGH (ref 34.0–46.6)

## 2016-07-05 DIAGNOSIS — E785 Hyperlipidemia, unspecified: Secondary | ICD-10-CM | POA: Diagnosis not present

## 2016-07-05 DIAGNOSIS — G894 Chronic pain syndrome: Secondary | ICD-10-CM | POA: Diagnosis not present

## 2016-07-05 DIAGNOSIS — I5030 Unspecified diastolic (congestive) heart failure: Secondary | ICD-10-CM | POA: Diagnosis not present

## 2016-07-05 DIAGNOSIS — M542 Cervicalgia: Secondary | ICD-10-CM | POA: Diagnosis not present

## 2016-07-05 DIAGNOSIS — J449 Chronic obstructive pulmonary disease, unspecified: Secondary | ICD-10-CM | POA: Diagnosis not present

## 2016-07-05 DIAGNOSIS — I110 Hypertensive heart disease with heart failure: Secondary | ICD-10-CM | POA: Diagnosis not present

## 2016-07-08 ENCOUNTER — Emergency Department (HOSPITAL_COMMUNITY): Payer: Medicare Other

## 2016-07-08 ENCOUNTER — Inpatient Hospital Stay (HOSPITAL_COMMUNITY)
Admission: EM | Admit: 2016-07-08 | Discharge: 2016-07-14 | DRG: 640 | Disposition: A | Discharge status: Home / Self Care | Payer: Medicare Other | Attending: Internal Medicine | Admitting: Internal Medicine

## 2016-07-08 ENCOUNTER — Encounter (HOSPITAL_COMMUNITY): Payer: Self-pay | Admitting: *Deleted

## 2016-07-08 DIAGNOSIS — W1830XA Fall on same level, unspecified, initial encounter: Secondary | ICD-10-CM | POA: Diagnosis present

## 2016-07-08 DIAGNOSIS — Z9049 Acquired absence of other specified parts of digestive tract: Secondary | ICD-10-CM

## 2016-07-08 DIAGNOSIS — I10 Essential (primary) hypertension: Secondary | ICD-10-CM | POA: Diagnosis present

## 2016-07-08 DIAGNOSIS — R112 Nausea with vomiting, unspecified: Secondary | ICD-10-CM | POA: Diagnosis present

## 2016-07-08 DIAGNOSIS — R001 Bradycardia, unspecified: Secondary | ICD-10-CM | POA: Diagnosis not present

## 2016-07-08 DIAGNOSIS — K3184 Gastroparesis: Secondary | ICD-10-CM | POA: Diagnosis present

## 2016-07-08 DIAGNOSIS — R079 Chest pain, unspecified: Secondary | ICD-10-CM

## 2016-07-08 DIAGNOSIS — Z880 Allergy status to penicillin: Secondary | ICD-10-CM

## 2016-07-08 DIAGNOSIS — N179 Acute kidney failure, unspecified: Secondary | ICD-10-CM | POA: Diagnosis present

## 2016-07-08 DIAGNOSIS — J449 Chronic obstructive pulmonary disease, unspecified: Secondary | ICD-10-CM

## 2016-07-08 DIAGNOSIS — K589 Irritable bowel syndrome without diarrhea: Secondary | ICD-10-CM | POA: Diagnosis present

## 2016-07-08 DIAGNOSIS — Z886 Allergy status to analgesic agent status: Secondary | ICD-10-CM

## 2016-07-08 DIAGNOSIS — Z96642 Presence of left artificial hip joint: Secondary | ICD-10-CM | POA: Diagnosis present

## 2016-07-08 DIAGNOSIS — E785 Hyperlipidemia, unspecified: Secondary | ICD-10-CM | POA: Diagnosis present

## 2016-07-08 DIAGNOSIS — Z9689 Presence of other specified functional implants: Secondary | ICD-10-CM | POA: Diagnosis present

## 2016-07-08 DIAGNOSIS — G9341 Metabolic encephalopathy: Secondary | ICD-10-CM | POA: Diagnosis not present

## 2016-07-08 DIAGNOSIS — I110 Hypertensive heart disease with heart failure: Secondary | ICD-10-CM | POA: Diagnosis not present

## 2016-07-08 DIAGNOSIS — I471 Supraventricular tachycardia, unspecified: Secondary | ICD-10-CM

## 2016-07-08 DIAGNOSIS — W19XXXA Unspecified fall, initial encounter: Secondary | ICD-10-CM | POA: Diagnosis not present

## 2016-07-08 DIAGNOSIS — R002 Palpitations: Secondary | ICD-10-CM | POA: Diagnosis present

## 2016-07-08 DIAGNOSIS — M549 Dorsalgia, unspecified: Secondary | ICD-10-CM | POA: Diagnosis present

## 2016-07-08 DIAGNOSIS — K219 Gastro-esophageal reflux disease without esophagitis: Secondary | ICD-10-CM | POA: Diagnosis present

## 2016-07-08 DIAGNOSIS — I341 Nonrheumatic mitral (valve) prolapse: Secondary | ICD-10-CM | POA: Diagnosis present

## 2016-07-08 DIAGNOSIS — R1011 Right upper quadrant pain: Secondary | ICD-10-CM | POA: Diagnosis not present

## 2016-07-08 DIAGNOSIS — F0390 Unspecified dementia without behavioral disturbance: Secondary | ICD-10-CM | POA: Diagnosis not present

## 2016-07-08 DIAGNOSIS — R634 Abnormal weight loss: Secondary | ICD-10-CM | POA: Diagnosis present

## 2016-07-08 DIAGNOSIS — J439 Emphysema, unspecified: Secondary | ICD-10-CM | POA: Diagnosis not present

## 2016-07-08 DIAGNOSIS — M542 Cervicalgia: Secondary | ICD-10-CM | POA: Diagnosis present

## 2016-07-08 DIAGNOSIS — E860 Dehydration: Secondary | ICD-10-CM | POA: Diagnosis present

## 2016-07-08 DIAGNOSIS — R0602 Shortness of breath: Secondary | ICD-10-CM | POA: Diagnosis not present

## 2016-07-08 DIAGNOSIS — H409 Unspecified glaucoma: Secondary | ICD-10-CM | POA: Diagnosis present

## 2016-07-08 DIAGNOSIS — I440 Atrioventricular block, first degree: Secondary | ICD-10-CM | POA: Diagnosis present

## 2016-07-08 DIAGNOSIS — E039 Hypothyroidism, unspecified: Secondary | ICD-10-CM | POA: Diagnosis not present

## 2016-07-08 DIAGNOSIS — R110 Nausea: Secondary | ICD-10-CM | POA: Diagnosis not present

## 2016-07-08 DIAGNOSIS — G894 Chronic pain syndrome: Secondary | ICD-10-CM | POA: Diagnosis present

## 2016-07-08 DIAGNOSIS — Z87442 Personal history of urinary calculi: Secondary | ICD-10-CM

## 2016-07-08 DIAGNOSIS — M1990 Unspecified osteoarthritis, unspecified site: Secondary | ICD-10-CM | POA: Diagnosis present

## 2016-07-08 DIAGNOSIS — S299XXA Unspecified injury of thorax, initial encounter: Secondary | ICD-10-CM | POA: Diagnosis not present

## 2016-07-08 DIAGNOSIS — Z833 Family history of diabetes mellitus: Secondary | ICD-10-CM

## 2016-07-08 DIAGNOSIS — R410 Disorientation, unspecified: Secondary | ICD-10-CM

## 2016-07-08 DIAGNOSIS — S20211A Contusion of right front wall of thorax, initial encounter: Secondary | ICD-10-CM | POA: Diagnosis not present

## 2016-07-08 DIAGNOSIS — I4891 Unspecified atrial fibrillation: Secondary | ICD-10-CM | POA: Diagnosis present

## 2016-07-08 DIAGNOSIS — Z79899 Other long term (current) drug therapy: Secondary | ICD-10-CM

## 2016-07-08 DIAGNOSIS — Z8249 Family history of ischemic heart disease and other diseases of the circulatory system: Secondary | ICD-10-CM

## 2016-07-08 DIAGNOSIS — E871 Hypo-osmolality and hyponatremia: Secondary | ICD-10-CM | POA: Diagnosis not present

## 2016-07-08 DIAGNOSIS — Z885 Allergy status to narcotic agent status: Secondary | ICD-10-CM

## 2016-07-08 DIAGNOSIS — F17210 Nicotine dependence, cigarettes, uncomplicated: Secondary | ICD-10-CM | POA: Diagnosis present

## 2016-07-08 DIAGNOSIS — D751 Secondary polycythemia: Secondary | ICD-10-CM | POA: Diagnosis present

## 2016-07-08 DIAGNOSIS — E038 Other specified hypothyroidism: Secondary | ICD-10-CM | POA: Diagnosis present

## 2016-07-08 DIAGNOSIS — I5030 Unspecified diastolic (congestive) heart failure: Secondary | ICD-10-CM | POA: Diagnosis not present

## 2016-07-08 DIAGNOSIS — F419 Anxiety disorder, unspecified: Secondary | ICD-10-CM | POA: Diagnosis present

## 2016-07-08 DIAGNOSIS — Z882 Allergy status to sulfonamides status: Secondary | ICD-10-CM

## 2016-07-08 DIAGNOSIS — Z888 Allergy status to other drugs, medicaments and biological substances status: Secondary | ICD-10-CM

## 2016-07-08 DIAGNOSIS — J90 Pleural effusion, not elsewhere classified: Secondary | ICD-10-CM | POA: Diagnosis not present

## 2016-07-08 DIAGNOSIS — F09 Unspecified mental disorder due to known physiological condition: Secondary | ICD-10-CM | POA: Diagnosis present

## 2016-07-08 DIAGNOSIS — F329 Major depressive disorder, single episode, unspecified: Secondary | ICD-10-CM | POA: Diagnosis present

## 2016-07-08 DIAGNOSIS — S20219A Contusion of unspecified front wall of thorax, initial encounter: Secondary | ICD-10-CM

## 2016-07-08 DIAGNOSIS — S0083XA Contusion of other part of head, initial encounter: Secondary | ICD-10-CM

## 2016-07-08 DIAGNOSIS — S0990XA Unspecified injury of head, initial encounter: Secondary | ICD-10-CM | POA: Diagnosis not present

## 2016-07-08 DIAGNOSIS — R1013 Epigastric pain: Secondary | ICD-10-CM | POA: Diagnosis not present

## 2016-07-08 DIAGNOSIS — Z9181 History of falling: Secondary | ICD-10-CM

## 2016-07-08 LAB — CBC
HCT: 50.1 % — ABNORMAL HIGH (ref 36.0–46.0)
HCT: 50.4 % — ABNORMAL HIGH (ref 36.0–46.0)
HEMOGLOBIN: 18.5 g/dL — AB (ref 12.0–15.0)
Hemoglobin: 18.7 g/dL — ABNORMAL HIGH (ref 12.0–15.0)
MCH: 29.9 pg (ref 26.0–34.0)
MCH: 30 pg (ref 26.0–34.0)
MCHC: 36.9 g/dL — ABNORMAL HIGH (ref 30.0–36.0)
MCHC: 37.1 g/dL — ABNORMAL HIGH (ref 30.0–36.0)
MCV: 81.1 fL (ref 78.0–100.0)
MCV: 80.9 fL (ref 78.0–100.0)
PLATELETS: 301 10*3/uL (ref 150–400)
Platelets: 302 10*3/uL (ref 150–400)
RBC: 6.18 MIL/uL — AB (ref 3.87–5.11)
RBC: 6.23 MIL/uL — AB (ref 3.87–5.11)
RDW: 13.7 % (ref 11.5–15.5)
RDW: 14.2 % (ref 11.5–15.5)
WBC: 16 10*3/uL — ABNORMAL HIGH (ref 4.0–10.5)
WBC: 14.4 10*3/uL — AB (ref 4.0–10.5)

## 2016-07-08 LAB — CBC WITH DIFFERENTIAL/PLATELET
BASOS ABS: 0 10*3/uL (ref 0.0–0.1)
Basophils Relative: 0 %
EOS ABS: 0 10*3/uL (ref 0.0–0.7)
Eosinophils Relative: 0 %
HEMATOCRIT: 53.6 % — AB (ref 36.0–46.0)
Hemoglobin: 20 g/dL — ABNORMAL HIGH (ref 12.0–15.0)
LYMPHS ABS: 3 10*3/uL (ref 0.7–4.0)
Lymphocytes Relative: 20 %
MCH: 30.1 pg (ref 26.0–34.0)
MCHC: 37.3 g/dL — AB (ref 30.0–36.0)
MCV: 80.7 fL (ref 78.0–100.0)
Monocytes Absolute: 0.9 10*3/uL (ref 0.1–1.0)
Monocytes Relative: 6 %
NEUTROS ABS: 11 10*3/uL — AB (ref 1.7–7.7)
Neutrophils Relative %: 74 %
Platelets: 333 10*3/uL (ref 150–400)
RBC: 6.64 MIL/uL — AB (ref 3.87–5.11)
RDW: 13.7 % (ref 11.5–15.5)
WBC: 14.9 10*3/uL — AB (ref 4.0–10.5)

## 2016-07-08 LAB — BASIC METABOLIC PANEL
ANION GAP: 12 (ref 5–15)
Anion gap: 13 (ref 5–15)
BUN: 30 mg/dL — ABNORMAL HIGH (ref 6–20)
BUN: 31 mg/dL — ABNORMAL HIGH (ref 6–20)
CALCIUM: 10.3 mg/dL (ref 8.9–10.3)
CALCIUM: 9.3 mg/dL (ref 8.9–10.3)
CO2: 18 mmol/L — ABNORMAL LOW (ref 22–32)
CO2: 17 mmol/L — ABNORMAL LOW (ref 22–32)
CREATININE: 1.67 mg/dL — AB (ref 0.44–1.00)
CREATININE: 1.55 mg/dL — AB (ref 0.44–1.00)
Chloride: 90 mmol/L — ABNORMAL LOW (ref 101–111)
Chloride: 94 mmol/L — ABNORMAL LOW (ref 101–111)
GFR, EST AFRICAN AMERICAN: 34 mL/min — AB (ref >60)
GFR, EST AFRICAN AMERICAN: 37 mL/min — AB (ref >60)
GFR, EST NON AFRICAN AMERICAN: 30 mL/min — AB (ref >60)
GFR, EST NON AFRICAN AMERICAN: 32 mL/min — AB (ref >60)
Glucose, Bld: 185 mg/dL — ABNORMAL HIGH (ref 65–99)
Glucose, Bld: 115 mg/dL — ABNORMAL HIGH (ref 65–99)
Potassium: 4.6 mmol/L (ref 3.5–5.1)
Potassium: 4.1 mmol/L (ref 3.5–5.1)
SODIUM: 121 mmol/L — AB (ref 135–145)
SODIUM: 123 mmol/L — AB (ref 135–145)

## 2016-07-08 LAB — VITAMIN B12: VITAMIN B 12: 682 pg/mL (ref 180–914)

## 2016-07-08 LAB — CREATININE, SERUM
Creatinine, Ser: 1.55 mg/dL — ABNORMAL HIGH (ref 0.44–1.00)
GFR calc Af Amer: 37 mL/min — ABNORMAL LOW (ref >60)
GFR calc non Af Amer: 32 mL/min — ABNORMAL LOW (ref >60)

## 2016-07-08 LAB — SODIUM, URINE, RANDOM: Sodium, Ur: <10 mmol/L

## 2016-07-08 LAB — OSMOLALITY, URINE: OSMOLALITY UR: 427 mosm/kg (ref 300–900)

## 2016-07-08 LAB — OSMOLALITY: OSMOLALITY: 265 mosm/kg — AB (ref 275–295)

## 2016-07-08 MED ORDER — ONDANSETRON HCL 4 MG/2ML IJ SOLN: 4.0000 mg | Freq: Four times a day (QID) | INTRAMUSCULAR | Status: DC | PRN

## 2016-07-08 MED ORDER — ALIVE WOMENS 50+ PO TABS: 1.0000 | ORAL_TABLET | Freq: Every day | ORAL | Status: DC

## 2016-07-08 MED ORDER — SODIUM CHLORIDE 0.9 % IV SOLN
INTRAVENOUS | Status: DC
  Administered 2016-07-08: 50 mL/h via INTRAVENOUS
  Administered 2016-07-09: 13:00:00 via INTRAVENOUS

## 2016-07-08 MED ORDER — SODIUM CHLORIDE 0.9 % IV BOLUS (SEPSIS)
1000.0000 mL | Freq: Once | INTRAVENOUS | Status: AC
  Administered 2016-07-08: 1000 mL via INTRAVENOUS

## 2016-07-08 MED ORDER — PANTOPRAZOLE SODIUM 40 MG IV SOLR
40.0000 mg | Freq: Two times a day (BID) | INTRAVENOUS | Status: DC
  Administered 2016-07-08 – 2016-07-09 (×3): 40 mg via INTRAVENOUS
  Filled 2016-07-08 (×4): qty 40

## 2016-07-08 MED ORDER — LEVOTHYROXINE SODIUM 75 MCG PO TABS
150.0000 ug | ORAL_TABLET | Freq: Every day | ORAL | Status: DC
  Administered 2016-07-09 – 2016-07-14 (×6): 150 ug via ORAL
  Filled 2016-07-08 (×8): qty 2

## 2016-07-08 MED ORDER — ONDANSETRON HCL 4 MG PO TABS: 4.0000 mg | ORAL_TABLET | Freq: Four times a day (QID) | ORAL | Status: DC | PRN

## 2016-07-08 MED ORDER — SODIUM CHLORIDE 0.9 % IV SOLN
INTRAVENOUS | Status: DC
  Administered 2016-07-08: 16:00:00 via INTRAVENOUS

## 2016-07-08 MED ORDER — ACETAMINOPHEN 325 MG PO TABS
650.0000 mg | ORAL_TABLET | Freq: Four times a day (QID) | ORAL | Status: DC | PRN
  Administered 2016-07-08 – 2016-07-13 (×11): 650 mg via ORAL
  Filled 2016-07-08 (×11): qty 2

## 2016-07-08 MED ORDER — SODIUM CHLORIDE 0.9 % IV SOLN: INTRAVENOUS | Status: DC

## 2016-07-08 MED ORDER — ADULT MULTIVITAMIN W/MINERALS CH
1.0000 | ORAL_TABLET | Freq: Every day | ORAL | Status: DC
  Administered 2016-07-08 – 2016-07-14 (×6): 1 via ORAL
  Filled 2016-07-08 (×7): qty 1

## 2016-07-08 MED ORDER — VITAMIN B-1 100 MG PO TABS
100.0000 mg | ORAL_TABLET | Freq: Every day | ORAL | Status: DC
  Administered 2016-07-08 – 2016-07-14 (×7): 100 mg via ORAL
  Filled 2016-07-08 (×7): qty 1

## 2016-07-08 MED ORDER — SODIUM CHLORIDE 0.9 % IV BOLUS (SEPSIS): 500.0000 mL | Freq: Once | INTRAVENOUS | Status: DC

## 2016-07-08 MED ORDER — ACETAMINOPHEN 650 MG RE SUPP: 650.0000 mg | Freq: Four times a day (QID) | RECTAL | Status: DC | PRN

## 2016-07-08 MED ORDER — SODIUM CHLORIDE 0.9% FLUSH
3.0000 mL | Freq: Two times a day (BID) | INTRAVENOUS | Status: DC
  Administered 2016-07-08 – 2016-07-13 (×10): 3 mL via INTRAVENOUS

## 2016-07-08 MED ORDER — SUCRALFATE 1 GM/10ML PO SUSP
1.0000 g | Freq: Three times a day (TID) | ORAL | Status: DC
  Administered 2016-07-08 – 2016-07-14 (×21): 1 g via ORAL
  Filled 2016-07-08 (×22): qty 10

## 2016-07-08 MED ORDER — ASPIRIN EC 81 MG PO TBEC
81.0000 mg | DELAYED_RELEASE_TABLET | Freq: Every day | ORAL | Status: DC
  Administered 2016-07-08 – 2016-07-14 (×4): 81 mg via ORAL
  Filled 2016-07-08 (×6): qty 1

## 2016-07-08 MED ORDER — ENOXAPARIN SODIUM 40 MG/0.4ML ~~LOC~~ SOLN
40.0000 mg | SUBCUTANEOUS | Status: DC
  Administered 2016-07-08 – 2016-07-13 (×6): 40 mg via SUBCUTANEOUS
  Filled 2016-07-08 (×6): qty 0.4

## 2016-07-08 NOTE — Progress Notes (Signed)
Received from ED, alert oriented  To person and place, disoriented to time and situation, cannot remember that she fell from home.  Complaints of pain in her Right ribcage.will endorsed accordingly.

## 2016-07-08 NOTE — ED Provider Notes (Signed)
[facility] DEPT Provider Note   CSN: [id number] Arrival date & time: 07/08/16  1113     History   Chief Complaint Chief Complaint  Patient presents with  . Fall    HPI Destiny Hughes is a 71 y.o. female.  Patient is a 71 year old female with past medical history of COPD, mitral valve prolapse, diabetes, and recent admission for abdominal pain. She presents today for evaluation of weakness. This is been ongoing for the past several days. She fell early this morning while attempting to go to the bathroom and hit her ribs in head. There was no loss of consciousness. She denies any difficulty breathing. She denies any new abdominal pain. She does admit to some loose stools and lack of appetite.   The history is provided by the patient.  Fall  This is a new problem. Episode onset: 3 days ago. The problem occurs constantly. The problem has been gradually worsening. Associated symptoms include abdominal pain. Pertinent negatives include no chest pain. Nothing aggravates the symptoms. Nothing relieves the symptoms. She has tried nothing for the symptoms. The treatment provided no relief.    Past Medical History:  Diagnosis Date  . Arthritis 01-25-11   back and most joints  . Blood transfusion    after Thoracic surgery ?'04  . Chronic back pain   . Chronic neck pain   . Collapse of right lung   . COPD (chronic obstructive pulmonary disease) (HCC)    Hx. Bronchitis occ, 01-25-11 somes issue now taking  Z-pak-started 01-24-11/Scar tissue  present  from previous lung collapse  . Depression   . Dyspnea   . Dysrhythmia    hx.PAT- tx. Inderal  . Emphysema of lung ([place])   . GERD (gastroesophageal reflux disease)    tx. TUMS  . Glaucoma   . Headache(784.0) 01-25-11   neck and nerve pain related.  Marland Kitchen Heart murmur   . Hyperlipemia   . Hypothyroidism    tx. Levothyroxine  . Nephrolithiasis   . Neuromuscular disorder ([place]) 01-25-11   Pain/nerve stimulator implanted -2 yrs ago.  .  Palpitation   . Tachycardia     Patient Active Problem List   Diagnosis Date Noted  . AKI (acute kidney injury) ([place]) 07/02/2016  . Acute kidney injury ([place]) 07/01/2016  . Hyperglycemia 07/01/2016  . Hyponatremia 07/01/2016  . Intractable nausea and vomiting 07/01/2016  . Abnormal EKG 07/01/2016  . Diastolic CHF ([place]) 28/41/3244  . Medication management 04/28/2015  . Emphysema lung ([place]) 12/26/2014  . GERD (gastroesophageal reflux disease) 12/26/2014  . Chronic bronchitis ([place]) 11/13/2014  . COPD (chronic obstructive pulmonary disease) ([place]) 10/03/2014  . At high risk for falls 10/03/2014  . Hypothyroidism 10/03/2014  . Mitral valve prolapse 10/03/2014  . Encounter for Medicare annual wellness exam 10/03/2014  . IBS (irritable bowel syndrome) 04/01/2014  . H/O total knee replacement 04/22/2013  . Essential hypertension 04/15/2013  . Vitamin D deficiency 04/15/2013  . Encounter for long-term (current) use of medications 04/15/2013  . Prediabetes 04/15/2013  . Osteoarthritis 01/31/2011  . Hyperlipemia   . Glaucoma   . Chronic neck pain   . Chronic back pain     Past Surgical History:  Procedure Laterality Date  . APPENDECTOMY  01-25-11  . CARPAL TUNNEL RELEASE  01-25-11   [place].  . CERVICAL SPINE SURGERY  01-25-11   2005-multiple levels  . CHOLECYSTECTOMY  01-25-11   '07  . COLONOSCOPY WITH PROPOFOL N/A 07/23/2012   Procedure: COLONOSCOPY WITH PROPOFOL;  Surgeon:  Garlan Fair, MD;  Location: [facility];  Service: Endoscopy;  Laterality: N/A;  . EYE SURGERY    . JOINT REPLACEMENT  01-25-11   6'03 LTHA-hemi  . KNEE ARTHROPLASTY  01-25-11   '04-right, revised x2  . SPINAL CORD STIMULATOR IMPLANT  2009 APPROX   DR. ELSNER  . THORACIC SPINE SURGERY  01-25-11   6'02- then nerve stimulator implanted after  . TOTAL KNEE REVISION  02/01/2011   Procedure: TOTAL KNEE REVISION;  Surgeon: Mauri Pole;  Location: [facility];  Service: Orthopedics;  Laterality: Right;  . TUBAL  LIGATION      OB History    No data available       Home Medications    Prior to Admission medications   Medication Sig Start Date End Date Taking? Authorizing Provider  levothyroxine (SYNTHROID, LEVOTHROID) 150 MCG tablet TAKE 1 TO 1+1/2 TABLETS DAILY AS DIRECTED Patient taking differently: Take 150 mcg by mouth in the morning before breakfast on Sun/Mon/Wed/Fri/Sat and 225 mcg on Tues/Thurs 05/14/16   Unk Pinto, MD  methadone (DOLOPHINE) 5 MG tablet Take 5 mg by mouth every 6 (six) hours.     Historical Provider, MD  Multiple Vitamins-Minerals (ALIVE WOMENS 50+) TABS Take 1 tablet by mouth daily.    Historical Provider, MD  oxyCODONE (ROXICODONE) 15 MG immediate release tablet Take 1 tablet by mouth every 6 (six) hours as needed for pain.  07/31/15   Historical Provider, MD  propranolol (INDERAL) 20 MG tablet Take 1 tablet (20 mg total) by mouth 2 (two) times daily as needed (palpitations). 07/03/16   Velvet Bathe, MD  sertraline (ZOLOFT) 100 MG tablet Take 100 mg by mouth at bedtime.     Historical Provider, MD    Family History Family History  Problem Relation Age of Onset  . Heart attack Father   . Emphysema Father   . Aneurysm Mother     CEREBRAL  . Emphysema Mother   . Dementia Mother   . Diabetes Son   . Hypertension Son   . Hypertension Son   . Cancer Sister     Widely Metastatic  . Asthma Son     x2  . Breast cancer Paternal Aunt   . Rheum arthritis Maternal Aunt     Social History Social History  Substance Use Topics  . Smoking status: Current Every Day Smoker    Packs/day: 0.75    Years: 50.00    Types: Cigarettes    Start date: 09/13/1961  . Smokeless tobacco: Never Used     Comment: Peak rate of 1ppd  . Alcohol use No     Comment: none in 10 years     Allergies   Albuterol; Penicillins; Ventolin [kdc:albuterol]; Sulfa antibiotics; Zanaflex [tizanidine]; Codeine; and Ecotrin [aspirin]   Review of Systems Review of Systems  Cardiovascular:  Negative for chest pain.  Gastrointestinal: Positive for abdominal pain.  All other systems reviewed and are negative.    Physical Exam Updated Vital Signs BP 111/78   Pulse 79   Temp 97.4 F (36.3 C) (Oral)   Resp 18   SpO2 97%   Physical Exam  Constitutional: She is oriented to person, place, and time. She appears well-developed and well-nourished. No distress.  HENT:  Head: Normocephalic and atraumatic.  Mouth/Throat: Oropharynx is clear and moist.  Neck: Normal range of motion. Neck supple.  Cardiovascular: Normal rate and regular rhythm.  Exam reveals no gallop and no friction rub.   No murmur heard.  Pulmonary/Chest: Effort normal and breath sounds normal. No respiratory distress. She has no wheezes.  Abdominal: Soft. Bowel sounds are normal. She exhibits no distension. There is tenderness.  There is some tenderness in the right upper quadrant and right lower quadrant. There is no rebound or guarding.  Musculoskeletal: Normal range of motion. She exhibits no edema.  Neurological: She is alert and oriented to person, place, and time. No cranial nerve deficit. She exhibits normal muscle tone. Coordination normal.  Skin: Skin is warm and dry. She is not diaphoretic.  Nursing note and vitals reviewed.    ED Treatments / Results  Labs (all labs ordered are listed, but only abnormal results are displayed) Labs Reviewed  BASIC METABOLIC PANEL - Abnormal; Notable for the following:       Result Value   Sodium 121 (*)    Chloride 90 (*)    CO2 18 (*)    Glucose, Bld 185 (*)    BUN 30 (*)    Creatinine, Ser 1.67 (*)    GFR calc non Af Amer 30 (*)    GFR calc Af Amer 34 (*)    All other components within normal limits  CBC WITH DIFFERENTIAL/PLATELET    EKG  EKG Interpretation None       Radiology Dg Ribs Unilateral W/chest Right  Result Date: 07/08/2016 CLINICAL DATA:  Fall EXAM: RIGHT RIBS AND CHEST - 3+ VIEW COMPARISON:  07/01/2016 FINDINGS: Cardiomediastinal  silhouette is unremarkable. No infiltrate or pulmonary edema. No right rib fracture is identified. No pneumothorax. Postcholecystectomy surgical clips are noted. Spinal stimulator wires are noted upper thoracic spine IMPRESSION: No infiltrate or pulmonary edema. No right rib fracture is identified. No pneumothorax. Electronically Signed   By: Lahoma Crocker M.D.   On: 07/08/2016 13:19    Procedures Procedures (including critical care time)  Medications Ordered in ED Medications  sodium chloride 0.9 % bolus 1,000 mL (not administered)     Initial Impression / Assessment and Plan / ED Course  I have reviewed the triage vital signs and the nursing notes.  Pertinent labs & imaging results that were available during my care of the patient were reviewed by me and considered in my medical decision making (see chart for details).  Patient presents here with weakness and falls. She injured her ribs and head. Imaging studies of these areas are negative.  The patient has been having difficulty tolerating by mouth at home. Her husband reports she has had no appetite and has been very weak and unsteady on her feet. Her laboratory studies today reveal a sodium of 121 which I suspect may be partly to blame. She is also hemoconcentrated with a hemoglobin of 20. Her electrolytes are showing some acute kidney injury as well. She was given 1 L of normal saline and will be admitted to the hospitalist service under the care of Dr. Nehemiah Settle  Final Clinical Impressions(s) / ED Diagnoses   Final diagnoses:  None    New Prescriptions New Prescriptions   No medications on file     Veryl Speak, MD 07/08/16 1555

## 2016-07-08 NOTE — ED Notes (Signed)
Patient transported to CT 

## 2016-07-08 NOTE — H&P (Addendum)
HISTORY AND PHYSICAL       PATIENT DETAILS Name: Destiny Hughes Age: 71 y.o. Sex: female Date of Birth: Nov 23, 1945 Admit Date: 07/08/2016 GOT:LXBWIO Moore   Patient coming from: Home   CHIEF COMPLAINT:  Fall-earlier this morning Poor appetite, nausea and intermittent vomiting for the past 1-2 weeks. Approximately 20 pound weight loss in the past 2 months. Short-term memory loss-approximately for one month and a half.   HPI: Destiny Hughes is a 71 y.o. female with medical history significant of COPD, ongoing tobacco use, chronic pain syndrome-previously on narcotics (discontinued 3 weeks back by her primary pain management M.D.)-recently admitted from 4/13-4/15 for bradycardia, acute kidney injury and subsequently discharged home-brought back to the ED for evaluation of fall, generalized weakness, and poor appetite. Apparently, approximately a month and a half back-patient had cataract surgery, following which per family patient started to get confused-especially with short-term memory loss. She then subsequently developed a poor appetite, nausea and intermittent vomiting. This was subsequently accompanied by mostly upper abdominal pain. As noted above, she was admitted last week for acute kidney injury and bradycardia, and subsequently discharged home. Since her discharge, although slightly improved, she continued to have epigastric pain and very poor oral intake. Per family, she has been only drinking soda and water. She continues to complain of mostly epigastric pain and hardly any appetite. Per family she continues to exhibit mostly short-term memory loss and confusion. Early this morning, patient sustained a mechanical fall, later this morning, she was seen by her home health RN who advised ED visit. In the emergency room she was found to have hyponatremia and acute renal failure, subsequently the hospitalist service was asked to admit this patient for further  evaluation and treatment.  She has chronic pain and is managed by pain management-she previously was on oxycodone and methadone-due to her confusion-this was Approximately 3 weeks back by her primary pain management M.D. She was subsequently referred to neurology, but at the neurologist office she was very dehydrated and vomiting and subsequently sent to the ED-following which she was admitted from 4/13-4/15.   Per family, patient has lost approximately 20 pounds in the past 1-2 months.  Furthermore, per chart review (neurology note on 4/13) patient's sister passed away in February 2018-following which she has had significant grieving, depression and anxiety.  Patient denies any fever, dysuria, shortness of breath, cough. She does have 1-2 loose stools a day on occasion.  ED Course:  In the emergency room she was found to have Na of 121, creatinine of 1.67, WBC count of 14.9 along with a hemoglobin of 20.0. She was given 1 L of IV fluid bolus, and subsequently started on 100 mL of hour of normal saline (this has subsequently been discontinued by this M.D.). CT head was negative for acute abnormalities, chest x-ray and right rib x-ray was negative for acute abnormalities as well.  Note: Lives at: Home Mobility:  Independen Chronic Indwelling Foley:no   REVIEW OF SYSTEMS:  Constitutional:   No night sweats,  Fevers, chills, fatigue.  HEENT:    No headaches, Dysphagia,Tooth/dental problems,Sore throat  Cardio-vascular: No chest pain,Orthopnea, PND,lower extremity edema, anasarca, palpitations  GI:  No  indigestion, diarrhea, melena or hematochezia  Resp: No shortness of breath, cough, hemoptysis,plueritic chest pain.   Skin:  No rash or lesions.  GU:  No dysuria, change in color of urine, no urgency or frequency.  No flank pain.  Musculoskeletal: No joint pain  or swelling.  No decreased range of motion.  No back pain.  Endocrine: No heat intolerance, no cold intolerance, no  polyuria, no polydipsia   ALLERGIES:   Allergies  Allergen Reactions  . Albuterol Other (See Comments)    Had difficulty breathing after a nebulizer treatment  . Penicillins Anaphylaxis and Swelling    Has patient had a PCN reaction causing immediate rash, facial/tongue/throat swelling, SOB or lightheadedness with hypotension: Yes Has patient had a PCN reaction causing severe rash involving mucus membranes or skin necrosis: Rash Has patient had a PCN reaction that required hospitalization: No Has patient had a PCN reaction occurring within the last 10 years: No If all of the above answers are "NO", then may proceed with Cephalosporin use.  [name] [Kdc:Albuterol] Anaphylaxis  . Sulfa Antibiotics Swelling    Throat swells, but no shortness of breath noted  . Zanaflex [Tizanidine] Other (See Comments)    Possible confusion (??)  . Codeine Nausea And Vomiting  . Ecotrin [Aspirin] Nausea And Vomiting    PAST MEDICAL HISTORY: Past Medical History:  Diagnosis Date  . Arthritis 01-25-11   back and most joints  . Blood transfusion    after Thoracic surgery ?'04  . Chronic back pain   . Chronic neck pain   . Collapse of right lung   . COPD (chronic obstructive pulmonary disease) (HCC)    Hx. Bronchitis occ, 01-25-11 somes issue now taking  Z-pak-started 01-24-11/Scar tissue  present  from previous lung collapse  . Depression   . Dyspnea   . Dysrhythmia    hx.PAT- tx. Inderal  . Emphysema of lung ([place])   . GERD (gastroesophageal reflux disease)    tx. TUMS  . Glaucoma   . Headache(784.0) 01-25-11   neck and nerve pain related.  Marland Kitchen Heart murmur   . Hyperlipemia   . Hypothyroidism    tx. Levothyroxine  . Nephrolithiasis   . Neuromuscular disorder ([place]) 01-25-11   Pain/nerve stimulator implanted -2 yrs ago.  . Palpitation   . Tachycardia     PAST SURGICAL HISTORY: Past Surgical History:  Procedure Laterality Date  . APPENDECTOMY  01-25-11  . CARPAL TUNNEL RELEASE  01-25-11     Bil.  . CERVICAL SPINE SURGERY  01-25-11   2005-multiple levels  . CHOLECYSTECTOMY  01-25-11   '07  . COLONOSCOPY WITH PROPOFOL N/A 07/23/2012   Procedure: COLONOSCOPY WITH PROPOFOL;  Surgeon: Garlan Fair, MD;  Location: [facility];  Service: Endoscopy;  Laterality: N/A;  . EYE SURGERY    . JOINT REPLACEMENT  01-25-11   6'03 LTHA-hemi  . KNEE ARTHROPLASTY  01-25-11   '04-right, revised x2  . SPINAL CORD STIMULATOR IMPLANT  2009 APPROX   DR. ELSNER  . THORACIC SPINE SURGERY  01-25-11   6'02- then nerve stimulator implanted after  . TOTAL KNEE REVISION  02/01/2011   Procedure: TOTAL KNEE REVISION;  Surgeon: Mauri Pole;  Location: [facility];  Service: Orthopedics;  Laterality: Right;  . TUBAL LIGATION      MEDICATIONS AT HOME: Prior to Admission medications   Medication Sig Start Date End Date Taking? Authorizing Provider  levothyroxine (SYNTHROID, LEVOTHROID) 150 MCG tablet TAKE 1 TO 1+1/2 TABLETS DAILY AS DIRECTED Patient taking differently: Take 150 mcg by mouth in the morning before breakfast on Sun/Mon/Wed/Fri/Sat and 225 mcg on Tues/Thurs 05/14/16   Unk Pinto, MD  Multiple Vitamins-Minerals (ALIVE WOMENS 50+) TABS Take 1 tablet by mouth daily.    Historical Provider, MD  propranolol (INDERAL) 20 MG tablet Take 1 tablet (20 mg total) by mouth 2 (two) times daily as needed (palpitations). 07/03/16   Velvet Bathe, MD    FAMILY HISTORY: Family History  Problem Relation Age of Onset  . Heart attack Father   . Emphysema Father   . Aneurysm Mother     CEREBRAL  . Emphysema Mother   . Dementia Mother   . Diabetes Son   . Hypertension Son   . Hypertension Son   . Cancer Sister     Widely Metastatic  . Asthma Son     x2  . Breast cancer Paternal Aunt   . Rheum arthritis Maternal Aunt    SOCIAL HISTORY:  reports that she has been smoking Cigarettes.  She started smoking about 54 years ago. She has a 37.50 pack-year smoking history. She has never used smokeless  tobacco. She reports that she does not drink alcohol or use drugs.  PHYSICAL EXAM: Blood pressure (!) 144/85, pulse 82, temperature 97.6 F (36.4 C), temperature source Oral, resp. rate 16, SpO2 98 %.  General appearance :Awake, alert-but slightly confused-answers versus appropriately after multiple attempts. Requires some assistance from the family. She appears to have a very flat affect, and appears chronically sick appearing Eyes:, pupils equally reactive to light and accomodation,no scleral icterus. HEENT: Atraumatic and Normocephalic Neck: supple, no JVD.  Resp:Good air entry bilaterally, no added sounds  CVS: S1 S2 regular, no murmurs.  GI: Bowel sounds present, mildly tender in the epigastric area, with no distention, rest of the abdomen is benign on exam.  Extremities: B/L Lower Ext shows no edema, both legs are warm to touch Neurology:  Nonfocal exam-appears to have generalized weakness. Psychiatric:  Alert and oriented x 3. Musculoskeletal:No digital cyanosis Skin:No Rash, warm and dry Wounds:N/A  LABS ON ADMISSION:  I have personally reviewed following labs and imaging studies  CBC:  Recent Labs Lab 07/02/16 0412 07/08/16 1228  WBC 12.8* 14.9*  NEUTROABS  --  11.0*  HGB 16.5* 20.0*  HCT 45.9 53.6*  MCV 84.2 80.7  PLT 270 474    Basic Metabolic Panel:  Recent Labs Lab 07/01/16 2017 07/02/16 0412 07/03/16 0637 07/08/16 1228  NA  --  131* 135 121*  K  --  4.3 3.9 4.6  CL  --  100* 105 90*  CO2  --  23 23 18*  GLUCOSE  --  145* 96 185*  BUN  --  45* 24* 30*  CREATININE  --  1.85* 1.01* 1.67*  CALCIUM  --  8.9 8.9 10.3  MG 1.8  --   --   --   PHOS 5.7*  --   --   --     GFR: Estimated Creatinine Clearance: 28.5 mL/min (A) (by C-G formula based on SCr of 1.67 mg/dL (H)).  Liver Function Tests: No results for input(s): AST, ALT, ALKPHOS, BILITOT, PROT, ALBUMIN in the last 168 hours. No results for input(s): LIPASE, AMYLASE in the last 168 hours. No  results for input(s): AMMONIA in the last 168 hours.  Coagulation Profile:  Recent Labs Lab 07/01/16 2017  INR 1.04    Cardiac Enzymes:  Recent Labs Lab 07/01/16 2017 07/01/16 2211 07/02/16 0412 07/03/16 0637  TROPONINI 0.06* 0.07* 0.07* 0.03*    BNP (last 3 results) No results for input(s): PROBNP in the last 8760 hours.  HbA1C: No results for input(s): HGBA1C in the last 72 hours.  CBG:  Recent Labs Lab 07/02/16 1249 07/02/16 1709  07/02/16 2107 07/03/16 0838 07/03/16 1241  GLUCAP 122* 128* 127* 101* 106*    Lipid Profile: No results for input(s): CHOL, HDL, LDLCALC, TRIG, CHOLHDL, LDLDIRECT in the last 72 hours.  Thyroid Function Tests: No results for input(s): TSH, T4TOTAL, FREET4, T3FREE, THYROIDAB in the last 72 hours.  Anemia Panel: No results for input(s): VITAMINB12, FOLATE, FERRITIN, TIBC, IRON, RETICCTPCT in the last 72 hours.  Urine analysis:    Component Value Date/Time   COLORURINE YELLOW 07/01/2016 1832   APPEARANCEUR HAZY (A) 07/01/2016 1832   LABSPEC 1.012 07/01/2016 1832   PHURINE 5.0 07/01/2016 1832   GLUCOSEU NEGATIVE 07/01/2016 1832   HGBUR LARGE (A) 07/01/2016 1832   BILIRUBINUR NEGATIVE 07/01/2016 1832   KETONESUR NEGATIVE 07/01/2016 1832   PROTEINUR 30 (A) 07/01/2016 1832   UROBILINOGEN 0.2 02/17/2014 1026   NITRITE NEGATIVE 07/01/2016 1832   LEUKOCYTESUR MODERATE (A) 07/01/2016 1832    Sepsis Labs: Lactic Acid, Venous    Component Value Date/Time   LATICACIDVEN 0.76 07/01/2016 1757     Microbiology: Recent Results (from the past 240 hour(s))  Culture, Urine     Status: Abnormal   Collection Time: 07/01/16  3:32 AM  Result Value Ref Range Status   Specimen Description URINE, RANDOM  Final   Special Requests NONE  Final   Culture MULTIPLE SPECIES PRESENT, SUGGEST RECOLLECTION (A)  Final   Report Status 07/03/2016 FINAL  Final      RADIOLOGIC STUDIES ON ADMISSION: Dg Ribs Unilateral W/chest Right  Result  Date: 07/08/2016 CLINICAL DATA:  Fall EXAM: RIGHT RIBS AND CHEST - 3+ VIEW COMPARISON:  07/01/2016 FINDINGS: Cardiomediastinal silhouette is unremarkable. No infiltrate or pulmonary edema. No right rib fracture is identified. No pneumothorax. Postcholecystectomy surgical clips are noted. Spinal stimulator wires are noted upper thoracic spine IMPRESSION: No infiltrate or pulmonary edema. No right rib fracture is identified. No pneumothorax. Electronically Signed   By: Lahoma Crocker M.D.   On: 07/08/2016 13:19   Ct Head Wo Contrast  Result Date: 07/08/2016 CLINICAL DATA:  Head trauma secondary to a fall last night. EXAM: CT HEAD WITHOUT CONTRAST TECHNIQUE: Contiguous axial images were obtained from the base of the skull through the vertex without intravenous contrast. COMPARISON:  07/01/2016 FINDINGS: Brain: No evidence of acute infarction, hemorrhage, hydrocephalus, extra-axial collection or mass lesion/mass effect. Vascular: No hyperdense vessel or unexpected calcification. Skull: Normal. Negative for fracture or focal lesion. Sinuses/Orbits: Normal. Other: None IMPRESSION: Stable and negative noncontrast CT appearance of the brain. Electronically Signed   By: Lorriane Shire M.D.   On: 07/08/2016 15:46    I have personally reviewed images of chest xray or the CT head  EKG:  Not done.  ASSESSMENT AND PLAN: Hyponatremia: Given history of poor oral intake, nausea with vomiting-suspect this is secondary to dehydration and mostly fluid/liquid intake. Patient has already been given 1 L fluid bolus and 100 mL/hr of normal saline in the emergency room. This has been discontinued, her volume status looks relatively stable to me, stat electrolytes are currently pending. Recent TSH within normal limits, will check serum/urine osmolality, and urine sodium. Repeat electrolytes pending-will recheck electrolytes in the morning. Will gently hydrate with normal saline 50 mL an hour, await repeat electrolytes.  Acute  kidney injury: Suspect hemodynamically mediated kidney injury due to dehydration. Hydrate and recheck electrolytes tomorrow morning.  Erythrocytosis: I suspect this is from chronic hypoxia-she has a long-standing history of COPD (severe per outpatient PCCM note review). I suspect her hemoglobin has increased due  to dehydration-reflecting hemoconcentration. She is being hydrated, I would add aspirin, and recheck CBC tomorrow morning. For completion of workup-will check JAK2/Exon 12 mutation. Erythropoietin level will also be checked. If her hematocrit/hemoglobin does not improve following hydration, she may need therapeutic phlebotomy and a hematology consultation as an outpatient.  Acute metabolic encephalopathy: I suspect her current mental status is probably from hyponatremia and acute kidney injury. However she has had short-term memory loss preceding her acute illness-recent RPR negative. Will check vitamin B12, HIV levels to complete workup. Once her electrolytes improved, and if she still continues to have short-term memory issues-she may need MRI of the brain, EEG and neurology evaluation. Furthermore, due to recent death of her sister-she could have bereavement playing a role here. May need psych involvement-if no organic cause found. Due to hyponatremia-I will stop Zoloft for now.  Intractable nausea/vomiting:? Etiology. Recent lipase levels, and recent CT scan of the abdomen negative. For now we will provide supportive care with as needed antiemetics, PPI and attempt to collect her electrolyte abnormalities. If she continues to have nausea and vomiting, she may need a GI evaluation-her symptoms have been ongoing for the past few weeks-and she already had a recent hospitalization. Alternatively, if all the workup continues to be negative-this may be psychogenic in etiology-in light of her recent death of her sister and bereavement process.  Fall: Sounds like a mechanical fall-patient denied syncope.  PT evaluation. Check orthostatic vital signs-given history of GI loss.PT eval  Upper abdominal pain: Recent CT of the abdomen negative-recent lipase levels negative. Per family she is at approximately 20 pound weight loss in the past 1 month or so. For now-would attempt to correct electrolytes abnormalities, place on scheduled PPI/Carafate-and consult GI for endoscopic evaluation and once her electrolytes are more stable.  History of recent bradycardia: Both digoxin and propranolol had been discontinued-she was evaluated by cardiology and the most recent admission.  COPD: Per outpatient chart review-this is apparently severe. Her lungs are currently clear.  Hypothyroidism: Recent TSH within normal limits-continue with levothyroxine.  Chronic pain syndrome: On oxycodone and methadone to 3 weeks back-this has currently been stopped-continue with supportive measures-avoid narcotics given nausea/vomiting. Note-her symptoms of nausea/vomiting/confusion-were present even prior to her stopping narcotics-therefore doubt narcotic withdrawal.  Approximately 20 pound weight loss: See above. TSH within normal limits-recent CT scan without any obvious malignancy. Per family, she has had colonoscopy in the past-see above regarding plans for endoscopic evaluation once her electrolyte abnormalities are corrected.   Further plan will depend as patient's clinical course evolves and further radiologic and laboratory data become available. Patient will be monitored closely.  Above noted plan was discussed with patient/family  face to face at bedside, they were in agreement.   CONSULTS: None  DVT Prophylaxis: Prophylactic Lovenox   Code Status: Full Code  Disposition Plan:  Discharge back home vs SNF possibly in 2-3 days, depending on clinical course  Admission status: Inpatient going to tele  The medical decision making on this patient was of high complexity and the patient is at high risk for clinical  deterioration, therefore this is a level 3 visit.  Total time spent  55 minutes.Greater than 50% of this time was spent in counseling, explanation of diagnosis, planning of further management, and coordination of care.  Oren Binet [facility] Pager [phone]  If 7PM-7AM, please contact night-coverage [email or web address] Password [name] 07/08/2016, 5:35 PM

## 2016-07-08 NOTE — ED Notes (Signed)
Admitting at bedside 

## 2016-07-08 NOTE — ED Triage Notes (Signed)
Pt family reports recent illness with abdominal pain and weakness. Pt was seen and admitted to the hospital for same. Pt was at home last night and tripped in the restroom. Pt had fell and hit the front of her head. Pt noted to have a hematoma to forehead. Pt was told to come in by home health RN.

## 2016-07-09 DIAGNOSIS — G9341 Metabolic encephalopathy: Secondary | ICD-10-CM

## 2016-07-09 LAB — CBC
HEMATOCRIT: 45 % (ref 36.0–46.0)
Hemoglobin: 16.3 g/dL — ABNORMAL HIGH (ref 12.0–15.0)
MCH: 29.6 pg (ref 26.0–34.0)
MCHC: 36.2 g/dL — ABNORMAL HIGH (ref 30.0–36.0)
MCV: 81.8 fL (ref 78.0–100.0)
Platelets: 277 10*3/uL (ref 150–400)
RBC: 5.5 MIL/uL — ABNORMAL HIGH (ref 3.87–5.11)
RDW: 13.7 % (ref 11.5–15.5)
WBC: 13.1 10*3/uL — AB (ref 4.0–10.5)

## 2016-07-09 LAB — HIV ANTIBODY (ROUTINE TESTING W REFLEX): HIV SCREEN 4TH GENERATION: NONREACTIVE

## 2016-07-09 LAB — BASIC METABOLIC PANEL
ANION GAP: 11 (ref 5–15)
BUN: 28 mg/dL — AB (ref 6–20)
CALCIUM: 9 mg/dL (ref 8.9–10.3)
CO2: 16 mmol/L — ABNORMAL LOW (ref 22–32)
Chloride: 96 mmol/L — ABNORMAL LOW (ref 101–111)
Creatinine, Ser: 1.31 mg/dL — ABNORMAL HIGH (ref 0.44–1.00)
GFR calc Af Amer: 46 mL/min — ABNORMAL LOW (ref >60)
GFR, EST NON AFRICAN AMERICAN: 40 mL/min — AB (ref >60)
Glucose, Bld: 102 mg/dL — ABNORMAL HIGH (ref 65–99)
POTASSIUM: 4.2 mmol/L (ref 3.5–5.1)
SODIUM: 123 mmol/L — AB (ref 135–145)

## 2016-07-09 LAB — URINALYSIS, ROUTINE W REFLEX MICROSCOPIC
BILIRUBIN URINE: NEGATIVE
Glucose, UA: NEGATIVE mg/dL
Hgb urine dipstick: NEGATIVE
Ketones, ur: 5 mg/dL — AB
Nitrite: NEGATIVE
PH: 6 (ref 5.0–8.0)
PROTEIN: NEGATIVE mg/dL
SPECIFIC GRAVITY, URINE: 1.015 (ref 1.005–1.030)

## 2016-07-09 LAB — ERYTHROPOIETIN: ERYTHROPOIETIN: 2.8 m[IU]/mL (ref 2.6–18.5)

## 2016-07-09 MED ORDER — METOPROLOL TARTRATE 12.5 MG HALF TABLET
12.5000 mg | ORAL_TABLET | Freq: Two times a day (BID) | ORAL | Status: DC
  Administered 2016-07-09 – 2016-07-11 (×5): 12.5 mg via ORAL
  Filled 2016-07-09 (×5): qty 1

## 2016-07-09 MED ORDER — TRAMADOL HCL 50 MG PO TABS
50.0000 mg | ORAL_TABLET | Freq: Four times a day (QID) | ORAL | Status: DC | PRN
  Administered 2016-07-09 – 2016-07-14 (×15): 50 mg via ORAL
  Filled 2016-07-09 (×16): qty 1

## 2016-07-09 NOTE — Evaluation (Signed)
Physical Therapy Evaluation Patient Details Name: Destiny Hughes MRN: [id number] DOB: 1946-01-10 Today's Date: 07/09/2016   History of Present Illness  Pt is a 71 y/o female admitted from home secondary to sustaining a fall. Pt unable to recall events of this fall and pt's husband reports that he did not witness the fall. Pt found to be hyponatremic and with an AKI. Of note, pt recently admitted and discharged from [facility] on 4/13-4/15 secondary to bradycardia. PMH including but not limited to COPD and chronic pain.  Clinical Impression  Pt presented supine in bed with HOB elevated, awake and willing to participate in therapy session. Prior to admission, pt reported that she was independent with ambulation and husband stated that he assists with dressing. [name] husband is available to provide 24/7 supervision at home upon d/c. Pt currently requires min A for bed mobility, min guard for transfers and min guard to ambulate with use of an AD. Pt would continue to benefit from skilled physical therapy services at this time while admitted and after d/c to address the below listed limitations in order to improve overall safety and independence with functional mobility.     Follow Up Recommendations Home health PT;Supervision/Assistance - 24 hour    Equipment Recommendations  3in1 (PT)    Recommendations for Other Services       Precautions / Restrictions Precautions Precautions: Fall Restrictions Weight Bearing Restrictions: No      Mobility  Bed Mobility Overal bed mobility: Needs Assistance Bed Mobility: Supine to Sit;Sit to Supine     Supine to sit: Min guard;HOB elevated Sit to supine: Min assist   General bed mobility comments: increased time and effort, VC'ing for sequencing, min guard to achieve sitting EOB, min A with LEs to return to supine  Transfers Overall transfer level: Needs assistance Equipment used: Rolling walker (2 wheeled) Transfers: Sit to/from Stand Sit to  Stand: Min guard         General transfer comment: increased time, VC'ing for bilateral hand placement and min guard for safety  Ambulation/Gait Ambulation/Gait assistance: Min guard Ambulation Distance (Feet): 75 Feet Assistive device: Rolling walker (2 wheeled) Gait Pattern/deviations: Step-through pattern;Decreased stride length;Drifts right/left Gait velocity: decreased Gait velocity interpretation: Below normal speed for age/gender General Gait Details: very slow cadence, modest instability but no LOB or need for physical assistance. pt required frequent cueing for improved posture as she has a preference for maintaining her head in capital and cervical flexion.  Stairs            Wheelchair Mobility    Modified Rankin (Stroke Patients Only)       Balance Overall balance assessment: Needs assistance;History of Falls Sitting-balance support: Feet supported Sitting balance-Leahy Scale: Fair Sitting balance - Comments: sat EOB with min guard   Standing balance support: During functional activity;Bilateral upper extremity supported Standing balance-Leahy Scale: Poor Standing balance comment: pt reliant on bilateral UEs on RW                             Pertinent Vitals/Pain Pain Assessment: Faces Faces Pain Scale: Hurts even more Pain Location: R trunk Pain Descriptors / Indicators: Sore;Grimacing;Guarding Pain Intervention(s): Monitored during session;Repositioned    Home Living Family/patient expects to be discharged to:: Private residence Living Arrangements: Spouse/significant other Available Help at Discharge: Family;Available 24 hours/day Type of Home: House Home Access: Stairs to enter Entrance Stairs-Rails: Right;Left Entrance Stairs-Number of Steps: 2 Home Layout: One level  Home Equipment: [place] - 2 wheels;Cane - single point;Shower seat      Prior Function Level of Independence: Needs assistance   Gait / Transfers Assistance  Needed: pt stated that she was independent with ambulation  ADL's / Homemaking Assistance Needed: [name] husband stated that he assists her with dressing        Hand Dominance        Extremity/Trunk Assessment   Upper Extremity Assessment Upper Extremity Assessment: Generalized weakness    Lower Extremity Assessment Lower Extremity Assessment: Generalized weakness    Cervical / Trunk Assessment Cervical / Trunk Assessment: Kyphotic;Other exceptions Cervical / Trunk Exceptions: pt with preference to maintain head in capital flexion and cervical flexion  Communication   Communication: No difficulties  Cognition Arousal/Alertness: Awake/alert Behavior During Therapy: Flat affect Overall Cognitive Status: Impaired/Different from baseline Area of Impairment: Problem solving;Following commands                       Following Commands: Follows one step commands with increased time     Problem Solving: Slow processing;Decreased initiation;Requires verbal cues        General Comments      Exercises     Assessment/Plan    PT Assessment Patient needs continued PT services  PT Problem List Decreased strength;Decreased activity tolerance;Decreased balance;Decreased mobility;Decreased coordination;Decreased knowledge of use of DME;Decreased safety awareness;Pain       PT Treatment Interventions DME instruction;Gait training;Stair training;Functional mobility training;Therapeutic activities;Therapeutic exercise;Balance training;Neuromuscular re-education;Patient/family education    PT Goals (Current goals can be found in the Care Plan section)  Acute Rehab PT Goals Patient Stated Goal: To go home PT Goal Formulation: With patient/family Time For Goal Achievement: 07/23/16 Potential to Achieve Goals: Good    Frequency Min 3X/week   Barriers to discharge        Co-evaluation               End of Session Equipment Utilized During Treatment: Gait  belt Activity Tolerance: Patient limited by fatigue Patient left: in bed;with call bell/phone within reach;with family/visitor present Nurse Communication: Mobility status PT Visit Diagnosis: Unsteadiness on feet (R26.81);Other abnormalities of gait and mobility (R26.89);Pain Pain - Right/Left: Right Pain - part of body:  (torso)    Time: 4128-7867 PT Time Calculation (min) (ACUTE ONLY): 24 min   Charges:   PT Evaluation $PT Eval Low Complexity: 1 Procedure PT Treatments $Gait Training: 8-22 mins   PT G Codes:        [place], PT, DPT [place] 07/09/2016, 1:47 PM

## 2016-07-09 NOTE — Progress Notes (Addendum)
PROGRESS NOTE        PATIENT DETAILS Name: Destiny Hughes Age: 71 y.o. Sex: female Date of Birth: [birth year] Admit Date: 07/08/2016 Admitting Physician Evalee Mutton Kristeen Mans, MD YME:BRAXEN Laurance Flatten  Brief Narrative: Patient is a 71 y.o. female with history of COPD, tobacco use, chronic pain syndrome (off narcotics for the past 3 weeks) recently admitted from 4/13-4/15 for evaluation of bradycardia, acute kidney injury-readmitted on 4/20 for evaluation of fall,, weakness, epigastric pain, and ongoing intermittent confusion. Found to have hyponatremia, acute renal failure and subsequently admitted to the hospitalist service. See below for further details  Subjective: Looks a lot better this morning-eating breakfast when I walked in-half her platelet was already M.D. Abdominal pain has improved. She seems to be alert-awake-and answers most of my questions more fluently than yesterday.  Claims to have pain in the right chest area where she hit her chest when she sustained a mechanical fall on 4/20. Chest pain is easily reproducible with palpation and is pleuritic in nature.  Telemetry: Personally reviewed-short burst of SVT last night.  Assessment/Plan: Hyponatremia: Secondary to dehydration/poor oral intake-and predominantly fluid intake. Volume status appears stable, slowly improving with IV fluids. Continue to follow electrolytes.  Acute kidney injury: Hemodynamically mediated kidney injury due to dehydration, improving with IV fluids, continue to follow electrolytes  Acute metabolic encephalopathy: Seems to have significantly improved this morning-suspect acute etiology is from hyponatremia and acute kidney injury. However it appears that she has developed some amount of short-term memory loss over the past few months-TSH,vitamin B12 within normal limits, HIV and RPR negative. Per patient's husband today-her confusion/memory loss seems to be an intermittent issue. For  now since she is improving with supportive measures-plans are to continue to correct her electrolytes, may need to obtain MRI of the brain and other workup if her mental status does not clear up in the next few days. Furthermore, her sister recently passed 15 sure if bereavement is playing a role here.  Fever:? Etiology-1 episode last night-blood cultures obtained-pending. Check UA. Chest x-ray on 4/20 negative for pneumonia. Since clinically improved-monitor off antimicrobial therapy. If continues to have fever, will need more workup.  Nausea with vomiting: Seems to have resolved-she seems to be tolerating diet. Continue supportive measures, if this reoccurs we will consult GI for endoscopic evaluation. Note recent CT scan of the abdomen personally reviewed, negative for acute abnormalities.  Epigastric pain/upper abdominal pain: Ongoing for the past month or so, family claims she has lost approximately 20 pounds. For now continue with PPI/Carafate-abdominal pain seems to be somewhat better with these measures. Once her electrolytes are more stable, we will consult gastroenterology. Note recent CT scan of the abdomen did not show any acute abnormalities.  Fall: Suspect mechanical fall-occurred on 4/20 prior to hospital admission. Patient denies syncopal episode. Await physical therapy evaluation.  Erythrocytosis/polycythemia: Suspect this is from chronic hypoxia-she has a long-standing history of COPD (per outpatient pulmonology note-this is severe) and hemoconcentration due to dehydration. Hemoglobin has decreased following hydration. Erythropoietin surprisingly borderline low, awaiting JAK2/Exon workup.  Short burst of SVT: She was admitted last week for bradycardia-during that admission both propranolol and digoxin were discontinued, with plans to start propranolol as needed. Repeat EKG today-suspect we could start low-dose beta blocker and monitor.  History of bradycardia: Recently admitted  last week-digoxin and propranolol were discontinued. No further episodes  on telemetry-in fact short bursts of SVT. See above discussion.  Hypothyroidism: Continue levothyroxine  History of COPD: Currently stable-follow  Approximately 20 pound weight loss: Reported per family-TSH within normal limits, recent CT of the abdomen on 4/20 without any acute abnormalities. Suspect will require endoscopic evaluation at some point.   History of chronic pain syndrome: Methadone/oxycodone discontinued approximately 3 weeks prior to this admission. She currently has right-sided rib pain due to a mechanical fall-and is on as needed tramadol.  DVT Prophylaxis: Prophylactic Lovenox   Code Status: Full code   Family Communication: Spouse at bedside  Disposition Plan: Remain inpatient-but will plan on Home health on discharge  Antimicrobial agents: Anti-infectives    None      Procedures: None  CONSULTS:  None  Time spent: 25- minutes-Greater than 50% of this time was spent in counseling, explanation of diagnosis, planning of further management, and coordination of care.  MEDICATIONS: Scheduled Meds: . aspirin EC  81 mg Oral Daily  . enoxaparin (LOVENOX) injection  40 mg Subcutaneous Q24H  . levothyroxine  150 mcg Oral QAC breakfast  . multivitamin with minerals  1 tablet Oral Daily  . pantoprazole (PROTONIX) IV  40 mg Intravenous Q12H  . sodium chloride flush  3 mL Intravenous Q12H  . sucralfate  1 g Oral TID WC & HS  . thiamine  100 mg Oral Daily   Continuous Infusions: . sodium chloride 100 mL/hr at 07/09/16 0823   PRN Meds:.acetaminophen **OR** acetaminophen, ondansetron **OR** ondansetron (ZOFRAN) IV, traMADol   PHYSICAL EXAM: Vital signs: Vitals:   07/08/16 1810 07/08/16 1842 07/09/16 0032 07/09/16 0520  BP: 118/64  112/60 (!) 96/57  Pulse: 82 82 75 83  Resp:  20 20 20   Temp:  (!) 102.2 F (39 C) 98.4 F (36.9 C)   TempSrc:  Oral Oral   SpO2: 98% 98% 97% 97%    Weight:  59.4 kg (131 lb)  59.4 kg (131 lb)  Height:  5\' 6"  (1.676 m)     Filed Weights   07/08/16 1842 07/09/16 0520  Weight: 59.4 kg (131 lb) 59.4 kg (131 lb)   Body mass index is 21.14 kg/m.   General appearance :Awake, alert, not in any distress. Speech Clear. Not toxic Looking Eyes:, pupils equally reactive to light and accomodation,no scleral icterus. HEENT: Atraumatic and Normocephalic Neck: supple, no JVD. No cervical lymphadenopathy. No thyromegaly Resp:Good air entry bilaterally, no added sounds  CVS: S1 S2 regular, no murmurs.  GI: Bowel sounds present, Non tender and not distended with no gaurding, rigidity or rebound. Extremities: B/L Lower Ext shows no edema, both legs are warm to touch Neurology:  speech clear,Non focal, sensation is grossly intact. Psychiatric: Normal judgment and insight.  Musculoskeletal:No digital cyanosis Skin:No Rash, warm and dry Wounds:N/A  I have personally reviewed following labs and imaging studies  LABORATORY DATA: CBC:  Recent Labs Lab 07/08/16 1228 07/08/16 1736 07/08/16 1909 07/09/16 0612  WBC 14.9* 16.0* 14.4* 13.1*  NEUTROABS 11.0*  --   --   --   HGB 20.0* 18.5* 18.7* 16.3*  HCT 53.6* 50.1* 50.4* 45.0  MCV 80.7 81.1 80.9 81.8  PLT 333 301 302 161    Basic Metabolic Panel:  Recent Labs Lab 07/03/16 0637 07/08/16 1228 07/08/16 1736 07/08/16 1909 07/09/16 0612  NA 135 121* 123*  --  123*  K 3.9 4.6 4.1  --  4.2  CL 105 90* 94*  --  96*  CO2 23 18* 17*  --  16*  GLUCOSE 96 185* 115*  --  102*  BUN 24* 30* 31*  --  28*  CREATININE 1.01* 1.67* 1.55* 1.55* 1.31*  CALCIUM 8.9 10.3 9.3  --  9.0    GFR: Estimated Creatinine Clearance: 36.3 mL/min (A) (by C-G formula based on SCr of 1.31 mg/dL (H)).  Liver Function Tests: No results for input(s): AST, ALT, ALKPHOS, BILITOT, PROT, ALBUMIN in the last 168 hours. No results for input(s): LIPASE, AMYLASE in the last 168 hours. No results for input(s): AMMONIA in  the last 168 hours.  Coagulation Profile: No results for input(s): INR, PROTIME in the last 168 hours.  Cardiac Enzymes:  Recent Labs Lab 07/03/16 0637  TROPONINI 0.03*    BNP (last 3 results) No results for input(s): PROBNP in the last 8760 hours.  HbA1C: No results for input(s): HGBA1C in the last 72 hours.  CBG:  Recent Labs Lab 07/02/16 1249 07/02/16 1709 07/02/16 2107 07/03/16 0838 07/03/16 1241  GLUCAP 122* 128* 127* 101* 106*    Lipid Profile: No results for input(s): CHOL, HDL, LDLCALC, TRIG, CHOLHDL, LDLDIRECT in the last 72 hours.  Thyroid Function Tests: No results for input(s): TSH, T4TOTAL, FREET4, T3FREE, THYROIDAB in the last 72 hours.  Anemia Panel:  Recent Labs  07/08/16 1909  VITAMINB12 682    Urine analysis:    Component Value Date/Time   COLORURINE YELLOW 07/01/2016 1832   APPEARANCEUR HAZY (A) 07/01/2016 1832   LABSPEC 1.012 07/01/2016 1832   PHURINE 5.0 07/01/2016 1832   GLUCOSEU NEGATIVE 07/01/2016 1832   HGBUR LARGE (A) 07/01/2016 1832   BILIRUBINUR NEGATIVE 07/01/2016 1832   KETONESUR NEGATIVE 07/01/2016 1832   PROTEINUR 30 (A) 07/01/2016 1832   UROBILINOGEN 0.2 02/17/2014 1026   NITRITE NEGATIVE 07/01/2016 1832   LEUKOCYTESUR MODERATE (A) 07/01/2016 1832    Sepsis Labs: Lactic Acid, Venous    Component Value Date/Time   LATICACIDVEN 0.76 07/01/2016 1757    MICROBIOLOGY: Recent Results (from the past 240 hour(s))  Culture, Urine     Status: Abnormal   Collection Time: 07/01/16  3:32 AM  Result Value Ref Range Status   Specimen Description URINE, RANDOM  Final   Special Requests NONE  Final   Culture MULTIPLE SPECIES PRESENT, SUGGEST RECOLLECTION (A)  Final   Report Status 07/03/2016 FINAL  Final    RADIOLOGY STUDIES/RESULTS: Ct Abdomen Pelvis Wo Contrast  Result Date: 07/01/2016 CLINICAL DATA:  Diffuse pain all over. Nausea. Weight loss. Difficulty eating. EXAM: CT ABDOMEN AND PELVIS WITHOUT CONTRAST  TECHNIQUE: Multidetector CT imaging of the abdomen and pelvis was performed following the standard protocol without IV contrast. COMPARISON:  CT of the abdomen and pelvis 06/02/2016 FINDINGS: Lower chest: The lung bases are mildly degraded by patient motion. No focal nodule, mass, or airspace disease is present. Hepatobiliary: No focal liver abnormality is seen. Status post cholecystectomy. No biliary dilatation. Pancreas: Unremarkable. No pancreatic ductal dilatation or surrounding inflammatory changes. Spleen: Normal in size without focal abnormality. Adrenals/Urinary Tract: Adrenal glands are normal bilaterally. Kidneys and ureters are within normal limits. There is no stone or hydronephrosis. The urinary bladder is within normal limits. Stomach/Bowel: The stomach and duodenum are within normal limits. Small bowel is unremarkable. Appendix is visualized and normal. The ascending and transverse colon are within normal limits. The descending and sigmoid colon are unremarkable. Vascular/Lymphatic: Aortic atherosclerosis. No enlarged abdominal or pelvic lymph nodes. Reproductive: Uterus and bilateral adnexa are unremarkable. Other: No abdominal wall hernia or abnormality. No abdominopelvic ascites. Musculoskeletal:  Multilevel facet degenerative changes are present in the lumbar spine. These are most pronounced at L2-3 and L3-4 with slight anterolisthesis is present. Vertebral body heights alignment contained. There quickly blastic lesions. The bony pelvis is intact. Left total hip arthroplasty is noted. IMPRESSION: 1. No acute or focal lesion to explain the patient's diffuse abdominal pain. 2. Cholecystectomy. 3. Atherosclerosis without aneurysm. 4. Mild degenerative changes of the lumbar spine are likely within normal limits for age. Electronically Signed   By: San Morelle M.D.   On: 07/01/2016 15:33   Dg Ribs Unilateral W/chest Right  Result Date: 07/08/2016 CLINICAL DATA:  Fall EXAM: RIGHT RIBS AND  CHEST - 3+ VIEW COMPARISON:  07/01/2016 FINDINGS: Cardiomediastinal silhouette is unremarkable. No infiltrate or pulmonary edema. No right rib fracture is identified. No pneumothorax. Postcholecystectomy surgical clips are noted. Spinal stimulator wires are noted upper thoracic spine IMPRESSION: No infiltrate or pulmonary edema. No right rib fracture is identified. No pneumothorax. Electronically Signed   By: Lahoma Crocker M.D.   On: 07/08/2016 13:19   Ct Head Wo Contrast  Result Date: 07/08/2016 CLINICAL DATA:  Head trauma secondary to a fall last night. EXAM: CT HEAD WITHOUT CONTRAST TECHNIQUE: Contiguous axial images were obtained from the base of the skull through the vertex without intravenous contrast. COMPARISON:  07/01/2016 FINDINGS: Brain: No evidence of acute infarction, hemorrhage, hydrocephalus, extra-axial collection or mass lesion/mass effect. Vascular: No hyperdense vessel or unexpected calcification. Skull: Normal. Negative for fracture or focal lesion. Sinuses/Orbits: Normal. Other: None IMPRESSION: Stable and negative noncontrast CT appearance of the brain. Electronically Signed   By: Lorriane Shire M.D.   On: 07/08/2016 15:46   Ct Head Wo Contrast  Result Date: 07/01/2016 CLINICAL DATA:  71 year old female with confusion and hypotension. EXAM: CT HEAD WITHOUT CONTRAST TECHNIQUE: Contiguous axial images were obtained from the base of the skull through the vertex without intravenous contrast. COMPARISON:  Head CT without contrast 06/02/2016 and earlier FINDINGS: Brain: Stable cerebral volume. No midline shift, ventriculomegaly, mass effect, evidence of mass lesion, intracranial hemorrhage or evidence of cortically based acute infarction. Gray-white matter differentiation is within normal limits throughout the brain. Vascular: Calcified atherosclerosis at the skull base. No suspicious intracranial vascular hyperdensity. Skull: Stable and negative. No acute osseous abnormality identified.  Sinuses/Orbits: Visualized paranasal sinuses and mastoids are stable and well pneumatized. Other: Stable and negative orbit and scalp soft tissues. IMPRESSION: Stable and negative for age noncontrast CT appearance of the brain. Electronically Signed   By: Genevie Ann M.D.   On: 07/01/2016 17:43   Portable Chest 1 View  Result Date: 07/01/2016 CLINICAL DATA:  Encephalopathy EXAM: PORTABLE CHEST 1 VIEW COMPARISON:  Portable exam 1620 hours compared to 10/13/2015 FINDINGS: Intraspinal stimulator projects over the upper thoracic spine at T3-T5. Prior cervical fusion. Normal heart size, mediastinal contours, and pulmonary vascularity. Lungs emphysematous but clear. No pulmonary infiltrate, pleural effusion or pneumothorax. Bones demineralized. IMPRESSION: Probable COPD changes without acute infiltrate. Electronically Signed   By: Lavonia Dana M.D.   On: 07/01/2016 16:28     LOS: 1 day   Oren Binet, MD  [facility] Pager:[phone]  If 7PM-7AM, please contact night-coverage [email or web address] Password [facility] 07/09/2016, 12:23 PM

## 2016-07-09 NOTE — Progress Notes (Signed)
Paged Dr Myna Hidalgo about patients's request for pain medicine. Verbal order received for PRN tramadol Q6. Medication verified with Pharmacist Santiago Glad for patient allergies. Med given will continue to monitor patient.

## 2016-07-10 ENCOUNTER — Inpatient Hospital Stay (HOSPITAL_COMMUNITY): Payer: Medicare Other

## 2016-07-10 DIAGNOSIS — D751 Secondary polycythemia: Secondary | ICD-10-CM

## 2016-07-10 DIAGNOSIS — J449 Chronic obstructive pulmonary disease, unspecified: Secondary | ICD-10-CM

## 2016-07-10 LAB — BASIC METABOLIC PANEL
ANION GAP: 7 (ref 5–15)
BUN: 19 mg/dL (ref 6–20)
CO2: 21 mmol/L — AB (ref 22–32)
Calcium: 8.4 mg/dL — ABNORMAL LOW (ref 8.9–10.3)
Chloride: 104 mmol/L (ref 101–111)
Creatinine, Ser: 0.96 mg/dL (ref 0.44–1.00)
GFR, EST NON AFRICAN AMERICAN: 58 mL/min — AB (ref >60)
GLUCOSE: 96 mg/dL (ref 65–99)
POTASSIUM: 3.7 mmol/L (ref 3.5–5.1)
Sodium: 132 mmol/L — ABNORMAL LOW (ref 135–145)

## 2016-07-10 LAB — URINE CULTURE: Culture: <10000 — AB

## 2016-07-10 LAB — CBC
HEMATOCRIT: 38.9 % (ref 36.0–46.0)
Hemoglobin: 13.7 g/dL (ref 12.0–15.0)
MCH: 28.8 pg (ref 26.0–34.0)
MCHC: 35.2 g/dL (ref 30.0–36.0)
MCV: 81.9 fL (ref 78.0–100.0)
Platelets: 267 10*3/uL (ref 150–400)
RBC: 4.75 MIL/uL (ref 3.87–5.11)
RDW: 14.2 % (ref 11.5–15.5)
WBC: 9.6 10*3/uL (ref 4.0–10.5)

## 2016-07-10 MED ORDER — DICLOFENAC SODIUM 1 % TD GEL
2.0000 g | Freq: Four times a day (QID) | TRANSDERMAL | Status: DC
  Administered 2016-07-10 – 2016-07-14 (×15): 2 g via TOPICAL
  Filled 2016-07-10: qty 100

## 2016-07-10 MED ORDER — PANTOPRAZOLE SODIUM 40 MG PO TBEC
40.0000 mg | DELAYED_RELEASE_TABLET | Freq: Two times a day (BID) | ORAL | Status: DC
  Administered 2016-07-10 – 2016-07-14 (×9): 40 mg via ORAL
  Filled 2016-07-10 (×9): qty 1

## 2016-07-10 NOTE — Plan of Care (Signed)
Problem: Activity: Goal: Risk for activity intolerance will decrease Outcome: Progressing Patient tolerates ambulation to the [facility] with one assist

## 2016-07-10 NOTE — Progress Notes (Addendum)
PROGRESS NOTE        PATIENT DETAILS Name: Destiny Hughes Age: 71 y.o. Sex: female Date of Birth: 31-Jul-1945 Admit Date: 07/08/2016 Admitting Physician Evalee Mutton Kristeen Mans, MD CWU:[id number] Laurance Flatten  Brief Narrative: Patient is a 71 y.o. female with history of COPD, tobacco use, chronic pain syndrome (off narcotics for the past 3 weeks) recently admitted from 4/13-4/15 for evaluation of bradycardia, acute kidney injury-readmitted on 4/20 for evaluation of fall,, weakness, epigastric pain, and ongoing intermittent confusion. Found to have hyponatremia, acute renal failure and subsequently admitted to the hospitalist service. See below for further details  Subjective: Dizzy this morning. No nausea or vomiting. Epigastric pain has markedly improved. She is awake and alert.  Main issue is right-sided chest pain-following a mechanical fall.  Telemetry: Sinus rhythm  Assessment/Plan: Hyponatremia: Secondary to dehydration/poor oral intake and predominantly liquid intake. Markedly improved with hydration and other supportive measures. Continue to follow electrolytes periodically.   Acute kidney injury: Resolved, likely hemodynamically mediated acute kidney injury in the setting of dehydration/vomiting.   Acute metabolic encephalopathy: Improved-awake and alert yesterday and today as well. Spouse at bedside also acknowledges clinical improvement. CT head on admission and negative for acute abnormalities. Per family, for the past few months patient has had issues with short-term memory loss-TSH, vitamin B12, RPR, HIV negative. Will obtain MRI brain today-she has follow-up with neurology as an outpatient. Furthermore, her sister recently passed 71 sure if bereavement is playing a role here.  Fever:? Etiology-1 episode on 4/20-none since then. Blood cultures, urine cultures-negative, chest x-ray on 4/22 show some potential infiltrates. For now would continue to monitor  off antimicrobial therapy, given significant weight loss-will obtain a CT chest.  Nausea with vomiting: Resolved with supportive measures. Similar issues last admission as well. No major abnormality seen on CT scan of the abdomen on 4/13. Given weight loss, recurrent nature of nausea/vomiting, will discuss with GI today. Continue as needed antiemetics.  Epigastric pain/upper abdominal pain: Ongoing for the past month or so, family claims she has lost approximately 20 pounds. Continue with PPI/Carafate, since her electrolytes are now significantly improved, will discuss case with GI to see if patient can get an endoscopic evaluation while inpatient.   No major abnormality seen on CT scan of the abdomen on 4/13  Fall: Suspect mechanical fall-occurred on 4/20 prior to hospital admission. Patient denies syncopal episode. Await physical therapy evaluation.  Polycythemia: Likely from COPD/tobacco history. Hb significantly elevated on admission-likely secondary to hemoconcentration given history of vomiting. Erythropoietin  Borderline-awaiting JAK2/Exon workup.  Short burst of SVT: Seen on 4/21-telemetry unremarkable for the past 24 hours. Have started low-dose beta blocker. Follow.  History of bradycardia: Recently admitted last week-digoxin and propranolol were discontinued. No further episodes on telemetry-in fact short bursts of SVT. TSH normal. See above discussion.  Hypothyroidism: Continue levothyroxine  History of COPD: Currently stable-follow  Approximately 20 pound weight loss: Her family has around 20 pound weight loss in the past few months-TSH within normal limits. See above regarding endoscopic evaluation.    History of chronic pain syndrome: Methadone/oxycodone discontinued approximately 3 weeks prior to this admission. She currently has right-sided rib pain due to a mechanical fall-and is on as needed tramadol.  DVT Prophylaxis: Prophylactic Lovenox   Code Status: Full code    Family Communication: Spouse at bedside  Disposition Plan: Remain inpatient-but will  plan on Home health on discharge  Antimicrobial agents: Anti-infectives    None      Procedures: None  CONSULTS:  None  Time spent: 25- minutes-Greater than 50% of this time was spent in counseling, explanation of diagnosis, planning of further management, and coordination of care.  MEDICATIONS: Scheduled Meds: . aspirin EC  81 mg Oral Daily  . enoxaparin (LOVENOX) injection  40 mg Subcutaneous Q24H  . levothyroxine  150 mcg Oral QAC breakfast  . metoprolol tartrate  12.5 mg Oral BID  . multivitamin with minerals  1 tablet Oral Daily  . pantoprazole  40 mg Oral BID  . sodium chloride flush  3 mL Intravenous Q12H  . sucralfate  1 g Oral TID WC & HS  . thiamine  100 mg Oral Daily   Continuous Infusions:  PRN Meds:.acetaminophen **OR** acetaminophen, ondansetron **OR** ondansetron (ZOFRAN) IV, traMADol   PHYSICAL EXAM: Vital signs: Vitals:   07/09/16 1741 07/09/16 2032 07/10/16 0636 07/10/16 1251  BP: 123/61 (!) 113/53 (!) 102/51 115/61  Pulse: 81 67 61 67  Resp:  18  20  Temp:  98.3 F (36.8 C) 99.1 F (37.3 C) 98.4 F (36.9 C)  TempSrc:  Oral Oral Oral  SpO2: 99% 98% 97% 99%  Weight:   61.2 kg (134 lb 14.4 oz)   Height:       Filed Weights   07/08/16 1842 07/09/16 0520 07/10/16 0636  Weight: 59.4 kg (131 lb) 59.4 kg (131 lb) 61.2 kg (134 lb 14.4 oz)   Body mass index is 21.77 kg/m.   General appearance :Awake, alert, not in any distress.  Eyes:, pupils equally reactive to light and accomodation,no scleral icterus. HEENT: Atraumatic and Normocephalic Neck: supple, no JVD. No cervical lymphadenopathy.  Resp:Good air entry bilaterally, no added sounds  CVS: S1 S2 regular, no murmurs.  GI: Bowel sounds present, Non tender and not distended with no gaurding, rigidity or rebound. Extremities: B/L Lower Ext shows no edema, both legs are warm to touch Neurology:  speech  clear,Non focal, sensation is grossly intact. Psychiatric: Normal judgment and insight. Alert and oriented x 3. Normal mood. Musculoskeletal:No digital cyanosis Skin:No Rash, warm and dry Wounds:N/A  I have personally reviewed following labs and imaging studies  LABORATORY DATA: CBC:  Recent Labs Lab 07/08/16 1228 07/08/16 1736 07/08/16 1909 07/09/16 0612 07/10/16 0445  WBC 14.9* 16.0* 14.4* 13.1* 9.6  NEUTROABS 11.0*  --   --   --   --   HGB 20.0* 18.5* 18.7* 16.3* 13.7  HCT 53.6* 50.1* 50.4* 45.0 38.9  MCV 80.7 81.1 80.9 81.8 81.9  PLT 333 301 302 277 725    Basic Metabolic Panel:  Recent Labs Lab 07/08/16 1228 07/08/16 1736 07/08/16 1909 07/09/16 0612 07/10/16 0445  NA 121* 123*  --  123* 132*  K 4.6 4.1  --  4.2 3.7  CL 90* 94*  --  96* 104  CO2 18* 17*  --  16* 21*  GLUCOSE 185* 115*  --  102* 96  BUN 30* 31*  --  28* 19  CREATININE 1.67* 1.55* 1.55* 1.31* 0.96  CALCIUM 10.3 9.3  --  9.0 8.4*    GFR: Estimated Creatinine Clearance: 49.6 mL/min (by C-G formula based on SCr of 0.96 mg/dL).  Liver Function Tests: No results for input(s): AST, ALT, ALKPHOS, BILITOT, PROT, ALBUMIN in the last 168 hours. No results for input(s): LIPASE, AMYLASE in the last 168 hours. No results for input(s): AMMONIA in the last  168 hours.  Coagulation Profile: No results for input(s): INR, PROTIME in the last 168 hours.  Cardiac Enzymes: No results for input(s): CKTOTAL, CKMB, CKMBINDEX, TROPONINI in the last 168 hours.  BNP (last 3 results) No results for input(s): PROBNP in the last 8760 hours.  HbA1C: No results for input(s): HGBA1C in the last 72 hours.  CBG: No results for input(s): GLUCAP in the last 168 hours.  Lipid Profile: No results for input(s): CHOL, HDL, LDLCALC, TRIG, CHOLHDL, LDLDIRECT in the last 72 hours.  Thyroid Function Tests: No results for input(s): TSH, T4TOTAL, FREET4, T3FREE, THYROIDAB in the last 72 hours.  Anemia Panel:  Recent  Labs  07/08/16 1909  VITAMINB12 682    Urine analysis:    Component Value Date/Time   COLORURINE YELLOW 07/09/2016 1156   APPEARANCEUR HAZY (A) 07/09/2016 1156   LABSPEC 1.015 07/09/2016 1156   PHURINE 6.0 07/09/2016 1156   GLUCOSEU NEGATIVE 07/09/2016 1156   HGBUR NEGATIVE 07/09/2016 1156   BILIRUBINUR NEGATIVE 07/09/2016 1156   KETONESUR 5 (A) 07/09/2016 1156   PROTEINUR NEGATIVE 07/09/2016 1156   UROBILINOGEN 0.2 02/17/2014 1026   NITRITE NEGATIVE 07/09/2016 1156   LEUKOCYTESUR SMALL (A) 07/09/2016 1156    Sepsis Labs: Lactic Acid, Venous    Component Value Date/Time   LATICACIDVEN 0.76 07/01/2016 1757    MICROBIOLOGY: Recent Results (from the past 240 hour(s))  Culture, Urine     Status: Abnormal   Collection Time: 07/01/16  3:32 AM  Result Value Ref Range Status   Specimen Description URINE, RANDOM  Final   Special Requests NONE  Final   Culture MULTIPLE SPECIES PRESENT, SUGGEST RECOLLECTION (A)  Final   Report Status 07/03/2016 FINAL  Final  Culture, blood (routine x 2)     Status: None (Preliminary result)   Collection Time: 07/09/16  6:16 AM  Result Value Ref Range Status   Specimen Description BLOOD LEFT ARM  Final   Special Requests IN PEDIATRIC BOTTLE Blood Culture adequate volume  Final   Culture NO GROWTH 1 DAY  Final   Report Status PENDING  Incomplete  Culture, blood (routine x 2)     Status: None (Preliminary result)   Collection Time: 07/09/16  6:21 AM  Result Value Ref Range Status   Specimen Description BLOOD LEFT ARM  Final   Special Requests IN PEDIATRIC BOTTLE Blood Culture adequate volume  Final   Culture NO GROWTH 1 DAY  Final   Report Status PENDING  Incomplete  Culture, Urine     Status: Abnormal   Collection Time: 07/09/16  3:29 PM  Result Value Ref Range Status   Specimen Description URINE, RANDOM  Final   Special Requests NONE  Final   Culture <10,000 COLONIES/mL INSIGNIFICANT GROWTH (A)  Final   Report Status 07/10/2016 FINAL   Final    RADIOLOGY STUDIES/RESULTS: Ct Abdomen Pelvis Wo Contrast  Result Date: 07/01/2016 CLINICAL DATA:  Diffuse pain all over. Nausea. Weight loss. Difficulty eating. EXAM: CT ABDOMEN AND PELVIS WITHOUT CONTRAST TECHNIQUE: Multidetector CT imaging of the abdomen and pelvis was performed following the standard protocol without IV contrast. COMPARISON:  CT of the abdomen and pelvis 06/02/2016 FINDINGS: Lower chest: The lung bases are mildly degraded by patient motion. No focal nodule, mass, or airspace disease is present. Hepatobiliary: No focal liver abnormality is seen. Status post cholecystectomy. No biliary dilatation. Pancreas: Unremarkable. No pancreatic ductal dilatation or surrounding inflammatory changes. Spleen: Normal in size without focal abnormality. Adrenals/Urinary Tract: Adrenal glands are normal  bilaterally. Kidneys and ureters are within normal limits. There is no stone or hydronephrosis. The urinary bladder is within normal limits. Stomach/Bowel: The stomach and duodenum are within normal limits. Small bowel is unremarkable. Appendix is visualized and normal. The ascending and transverse colon are within normal limits. The descending and sigmoid colon are unremarkable. Vascular/Lymphatic: Aortic atherosclerosis. No enlarged abdominal or pelvic lymph nodes. Reproductive: Uterus and bilateral adnexa are unremarkable. Other: No abdominal wall hernia or abnormality. No abdominopelvic ascites. Musculoskeletal: Multilevel facet degenerative changes are present in the lumbar spine. These are most pronounced at L2-3 and L3-4 with slight anterolisthesis is present. Vertebral body heights alignment contained. There quickly blastic lesions. The bony pelvis is intact. Left total hip arthroplasty is noted. IMPRESSION: 1. No acute or focal lesion to explain the patient's diffuse abdominal pain. 2. Cholecystectomy. 3. Atherosclerosis without aneurysm. 4. Mild degenerative changes of the lumbar spine are  likely within normal limits for age. Electronically Signed   By: San Morelle M.D.   On: 07/01/2016 15:33   Dg Ribs Unilateral W/chest Right  Result Date: 07/08/2016 CLINICAL DATA:  Fall EXAM: RIGHT RIBS AND CHEST - 3+ VIEW COMPARISON:  07/01/2016 FINDINGS: Cardiomediastinal silhouette is unremarkable. No infiltrate or pulmonary edema. No right rib fracture is identified. No pneumothorax. Postcholecystectomy surgical clips are noted. Spinal stimulator wires are noted upper thoracic spine IMPRESSION: No infiltrate or pulmonary edema. No right rib fracture is identified. No pneumothorax. Electronically Signed   By: Lahoma Crocker M.D.   On: 07/08/2016 13:19   Ct Head Wo Contrast  Result Date: 07/08/2016 CLINICAL DATA:  Head trauma secondary to a fall last night. EXAM: CT HEAD WITHOUT CONTRAST TECHNIQUE: Contiguous axial images were obtained from the base of the skull through the vertex without intravenous contrast. COMPARISON:  07/01/2016 FINDINGS: Brain: No evidence of acute infarction, hemorrhage, hydrocephalus, extra-axial collection or mass lesion/mass effect. Vascular: No hyperdense vessel or unexpected calcification. Skull: Normal. Negative for fracture or focal lesion. Sinuses/Orbits: Normal. Other: None IMPRESSION: Stable and negative noncontrast CT appearance of the brain. Electronically Signed   By: Lorriane Shire M.D.   On: 07/08/2016 15:46   Ct Head Wo Contrast  Result Date: 07/01/2016 CLINICAL DATA:  71 year old female with confusion and hypotension. EXAM: CT HEAD WITHOUT CONTRAST TECHNIQUE: Contiguous axial images were obtained from the base of the skull through the vertex without intravenous contrast. COMPARISON:  Head CT without contrast 06/02/2016 and earlier FINDINGS: Brain: Stable cerebral volume. No midline shift, ventriculomegaly, mass effect, evidence of mass lesion, intracranial hemorrhage or evidence of cortically based acute infarction. Gray-white matter differentiation is  within normal limits throughout the brain. Vascular: Calcified atherosclerosis at the skull base. No suspicious intracranial vascular hyperdensity. Skull: Stable and negative. No acute osseous abnormality identified. Sinuses/Orbits: Visualized paranasal sinuses and mastoids are stable and well pneumatized. Other: Stable and negative orbit and scalp soft tissues. IMPRESSION: Stable and negative for age noncontrast CT appearance of the brain. Electronically Signed   By: Genevie Ann M.D.   On: 07/01/2016 17:43   Dg Chest Port 1 View  Result Date: 07/10/2016 CLINICAL DATA:  Patient shortness of breath. EXAM: PORTABLE CHEST 1 VIEW COMPARISON:  Chest radiograph 07/08/2016 FINDINGS: Anterior cervical spinal fusion hardware. Spinal stimulator device projects over the upper thoracic spine. Monitoring leads overlie the patient. Stable cardiac and mediastinal contours. Heterogeneous opacities right mid lung. No pleural effusion or pneumothorax. IMPRESSION: Heterogeneous opacities right mid lung which may represent infection in the appropriate clinical setting. Followup PA and  lateral chest X-ray is recommended in 3-4 weeks following trial of antibiotic therapy to ensure resolution and exclude underlying malignancy. Electronically Signed   By: Lovey Newcomer M.D.   On: 07/10/2016 07:38   Portable Chest 1 View  Result Date: 07/01/2016 CLINICAL DATA:  Encephalopathy EXAM: PORTABLE CHEST 1 VIEW COMPARISON:  Portable exam 1620 hours compared to 10/13/2015 FINDINGS: Intraspinal stimulator projects over the upper thoracic spine at T3-T5. Prior cervical fusion. Normal heart size, mediastinal contours, and pulmonary vascularity. Lungs emphysematous but clear. No pulmonary infiltrate, pleural effusion or pneumothorax. Bones demineralized. IMPRESSION: Probable COPD changes without acute infiltrate. Electronically Signed   By: Lavonia Dana M.D.   On: 07/01/2016 16:28     LOS: 2 days   Oren Binet, MD  [facility] Pager:[phone]  If 7PM-7AM, please contact night-coverage [email or web address] Password [email or web address] 07/10/2016, 1:55 PM

## 2016-07-11 ENCOUNTER — Encounter (HOSPITAL_COMMUNITY): Payer: Self-pay | Admitting: Cardiology

## 2016-07-11 DIAGNOSIS — I471 Supraventricular tachycardia, unspecified: Secondary | ICD-10-CM

## 2016-07-11 DIAGNOSIS — E039 Hypothyroidism, unspecified: Secondary | ICD-10-CM

## 2016-07-11 LAB — BASIC METABOLIC PANEL
Anion gap: 6 (ref 5–15)
BUN: 14 mg/dL (ref 6–20)
CALCIUM: 8.5 mg/dL — AB (ref 8.9–10.3)
CHLORIDE: 102 mmol/L (ref 101–111)
CO2: 22 mmol/L (ref 22–32)
CREATININE: 0.78 mg/dL (ref 0.44–1.00)
Glucose, Bld: 101 mg/dL — ABNORMAL HIGH (ref 65–99)
Potassium: 3 mmol/L — ABNORMAL LOW (ref 3.5–5.1)
SODIUM: 130 mmol/L — AB (ref 135–145)

## 2016-07-11 LAB — CBC
HCT: 37.8 % (ref 36.0–46.0)
HEMOGLOBIN: 13.2 g/dL (ref 12.0–15.0)
MCH: 28.8 pg (ref 26.0–34.0)
MCHC: 34.9 g/dL (ref 30.0–36.0)
MCV: 82.4 fL (ref 78.0–100.0)
PLATELETS: 275 10*3/uL (ref 150–400)
RBC: 4.59 MIL/uL (ref 3.87–5.11)
RDW: 14.5 % (ref 11.5–15.5)
WBC: 10.2 10*3/uL (ref 4.0–10.5)

## 2016-07-11 MED ORDER — METOPROLOL TARTRATE 5 MG/5ML IV SOLN
2.5000 mg | Freq: Once | INTRAVENOUS | Status: AC
  Administered 2016-07-11: 1 mg via INTRAVENOUS
  Filled 2016-07-11: qty 5

## 2016-07-11 MED ORDER — POTASSIUM CHLORIDE CRYS ER 20 MEQ PO TBCR
30.0000 meq | EXTENDED_RELEASE_TABLET | Freq: Once | ORAL | Status: AC
  Administered 2016-07-12: 30 meq via ORAL
  Filled 2016-07-11: qty 1

## 2016-07-11 MED ORDER — POTASSIUM CHLORIDE CRYS ER 20 MEQ PO TBCR
40.0000 meq | EXTENDED_RELEASE_TABLET | Freq: Once | ORAL | Status: AC
  Administered 2016-07-11: 40 meq via ORAL
  Filled 2016-07-11: qty 2

## 2016-07-11 MED ORDER — METOPROLOL TARTRATE 12.5 MG HALF TABLET
12.5000 mg | ORAL_TABLET | Freq: Once | ORAL | Status: AC
  Administered 2016-07-11: 12.5 mg via ORAL
  Filled 2016-07-11: qty 1

## 2016-07-11 MED ORDER — METOPROLOL TARTRATE 25 MG PO TABS: 25.0000 mg | ORAL_TABLET | Freq: Two times a day (BID) | ORAL | Status: DC

## 2016-07-11 MED ORDER — METOPROLOL TARTRATE 12.5 MG HALF TABLET
12.5000 mg | ORAL_TABLET | Freq: Two times a day (BID) | ORAL | Status: DC
  Administered 2016-07-12 – 2016-07-13 (×2): 12.5 mg via ORAL
  Filled 2016-07-11 (×3): qty 1

## 2016-07-11 NOTE — Progress Notes (Signed)
Traid on call NP directed nurse to page cardiology. Cardiology paged.

## 2016-07-11 NOTE — Progress Notes (Signed)
Pt is alert and oriented sitting up in the bed. With right chest wall pain. Warm compress in place to relief pain until po analgesic is due.

## 2016-07-11 NOTE — Progress Notes (Signed)
Physical Therapy Treatment Patient Details Name: Destiny Hughes MRN: [id number] DOB: 10/16/1945 Today's Date: 07/11/2016    History of Present Illness Pt is a 71 y/o female admitted from home secondary to sustaining a fall. Pt unable to recall events of this fall and pt's husband reports that he did not witness the fall. Pt found to be hyponatremic and with an AKI. Of note, pt recently admitted and discharged from [facility] on 4/13-4/15 secondary to bradycardia. PMH including but not limited to COPD and chronic pain.    PT Comments    Patient with unsteady gait without AD and strongly encouraged to use RW or cane at home. Pt seemed reluctant to use AD. Family present. Continue to progress as tolerated with anticipated d/c home with HHPT.    Follow Up Recommendations  Home health PT;Supervision/Assistance - 24 hour     Equipment Recommendations  3in1 (PT)    Recommendations for Other Services       Precautions / Restrictions Precautions Precautions: Fall Restrictions Weight Bearing Restrictions: No    Mobility  Bed Mobility Overal bed mobility: Needs Assistance Bed Mobility: Supine to Sit;Sit to Supine     Supine to sit: HOB elevated;Supervision Sit to supine: Min guard   General bed mobility comments: increased time/effort; no physical assist required; use of rail  Transfers Overall transfer level: Needs assistance Equipment used: None Transfers: Sit to/from Stand Sit to Stand: Min guard         General transfer comment: min guard for safety  Ambulation/Gait Ambulation/Gait assistance: Min assist;Mod assist Ambulation Distance (Feet): 80 Feet   Gait Pattern/deviations: Step-through pattern;Decreased stride length;Drifts right/left Gait velocity: decreased   General Gait Details: cues for posture and forward gaze; grossly min A however mod A X1 due to LOB; significant disturbance to gait with head turns and directional changes   Stairs             Wheelchair Mobility    Modified Rankin (Stroke Patients Only)       Balance Overall balance assessment: Needs assistance;History of Falls Sitting-balance support: Feet supported Sitting balance-Leahy Scale: Good       Standing balance-Leahy Scale: Fair                              Cognition Arousal/Alertness: Awake/alert Behavior During Therapy: Flat affect Overall Cognitive Status: Impaired/Different from baseline Area of Impairment: Safety/judgement                         Safety/Judgement: Decreased awareness of deficits            Exercises      General Comments General comments (skin integrity, edema, etc.): husband and son present      Pertinent Vitals/Pain Pain Assessment: Faces Faces Pain Scale: Hurts little more Pain Descriptors / Indicators: Headache Pain Intervention(s): Monitored during session;Repositioned    Home Living                      Prior Function            PT Goals (current goals can now be found in the care plan section) Acute Rehab PT Goals Patient Stated Goal: To go home PT Goal Formulation: With patient/family Time For Goal Achievement: 07/23/16 Potential to Achieve Goals: Good Progress towards PT goals: Progressing toward goals    Frequency    Min 3X/week  PT Plan Current plan remains appropriate    Co-evaluation             End of Session Equipment Utilized During Treatment: Gait belt Activity Tolerance: Patient limited by fatigue Patient left: in bed;with call bell/phone within reach;with family/visitor present Nurse Communication: Mobility status PT Visit Diagnosis: Unsteadiness on feet (R26.81);Other abnormalities of gait and mobility (R26.89);Pain Pain - Right/Left: Right Pain - part of body:  (torso)     Time: 1411-1435 PT Time Calculation (min) (ACUTE ONLY): 24 min  Charges:  $Gait Training: 8-22 mins $Therapeutic Activity: 8-22 mins                     G Codes:       Earney Navy, PTA Pager: [phone]     Darliss Cheney 07/11/2016, 4:28 PM

## 2016-07-11 NOTE — Consult Note (Signed)
Referring Provider:   Dr. Nena Alexander Primary Care Physician:  Minette Brine Primary Gastroenterologist:  Dr. Howell Rucks  Reason for Consultation:  Nausea, vomiting, epigastric pain  HPI: Destiny Hughes is a 71 y.o. female admitted 3 days ago with weakness and metabolic disarray.  Unfortunately, the patient suffers from dementia, which seems, in the family's opinion, to have gotten substantially worse beginning several days after cataract surgery about 6 weeks ago.  Much of the history is provided by the patient's husband and son who are at the bedside.  Although she had been having epigastric pain, nausea and vomiting, food intolerance, and 20 lb weight loss prior to admission, the patient's husband indicates that for the past 2 or 3 days in the hospital, the pain has been resolved, and she has been tolerating food.  The patient underwent endoscopic evaluation by Dr. Howell Rucks 5 years ago, which was essentially negative. About 4 years ago, she had colonoscopy by him, showing 2 diminutive adenomatous polyps.  On a recent admission (for bradycardia), the patient underwent a CT scan which was negative for any source of the above symptoms.  On admission this time, the patient came in with a sodium of 120 and acute renal insufficiency, which have both now markedly improved.  The patient is not on ulcerogenic medications. Of note, however, she was recently detoxed from methadone and chronic opioid therapy and is no longer on those medications.  Also, the patient's sister died in 05/30/22, raising the question as to whether emotional stress could be contributing to the recent symptoms.  Past Medical History:  Diagnosis Date  . Arthritis 01-25-11   back and most joints  . Blood transfusion    after Thoracic surgery ?'04  . Chronic back pain   . Chronic neck pain   . Collapse of right lung   . COPD (chronic obstructive pulmonary disease) (HCC)    Hx. Bronchitis occ, 01-25-11 somes  issue now taking  Z-pak-started 01-24-11/Scar tissue  present  from previous lung collapse  . Depression   . Dyspnea   . Dysrhythmia    hx.PAT- tx. Inderal  . Emphysema of lung ([place])   . GERD (gastroesophageal reflux disease)    tx. TUMS  . Glaucoma   . Headache(784.0) 01-25-11   neck and nerve pain related.  Marland Kitchen Heart murmur   . Hyperlipemia   . Hypothyroidism    tx. Levothyroxine  . Nephrolithiasis   . Neuromuscular disorder ([place]) 01-25-11   Pain/nerve stimulator implanted -2 yrs ago.  . Palpitation   . Tachycardia     Past Surgical History:  Procedure Laterality Date  . APPENDECTOMY  01-25-11  . CARPAL TUNNEL RELEASE  01-25-11   Bil.  . CERVICAL SPINE SURGERY  01-25-11   2005-multiple levels  . CHOLECYSTECTOMY  01-25-11   '07  . COLONOSCOPY WITH PROPOFOL N/A 07/23/2012   Procedure: COLONOSCOPY WITH PROPOFOL;  Surgeon: Garlan Fair, MD;  Location: [facility];  Service: Endoscopy;  Laterality: N/A;  . EYE SURGERY    . JOINT REPLACEMENT  01-25-11   6'03 LTHA-hemi  . KNEE ARTHROPLASTY  01-25-11   '04-right, revised x2  . SPINAL CORD STIMULATOR IMPLANT  2009 APPROX   DR. ELSNER  . THORACIC SPINE SURGERY  01-25-11   6'02- then nerve stimulator implanted after  . TOTAL KNEE REVISION  02/01/2011   Procedure: TOTAL KNEE REVISION;  Surgeon: Mauri Pole;  Location: [facility];  Service: Orthopedics;  Laterality: Right;  . TUBAL LIGATION  Prior to Admission medications   Medication Sig Start Date End Date Taking? Authorizing Provider  acetaminophen (TYLENOL) 325 MG tablet Take 325-650 mg by mouth every 6 (six) hours as needed for headache (pain).   Yes Historical Provider, MD  levothyroxine (SYNTHROID, LEVOTHROID) 150 MCG tablet TAKE 1 TO 1+1/2 TABLETS DAILY AS DIRECTED Patient taking differently: Take 1 1/2 tablet (225 mcg) by mouth on Tuesday, Thursday before breakfast, take 1 tablet (150 mcg) before breakfast on Sunday, Monday, Wednesday, Friday, Saturday 05/14/16  Yes Unk Pinto, MD  Multiple Vitamin (MULTIVITAMIN WITH MINERALS) TABS tablet Take 1 tablet by mouth daily. Women's Alive Gummies   Yes Historical Provider, MD  promethazine (PHENERGAN) 25 MG tablet Take 25 mg by mouth every 6 (six) hours as needed for nausea or vomiting.   Yes Historical Provider, MD  propranolol (INDERAL) 20 MG tablet Take 1 tablet (20 mg total) by mouth 2 (two) times daily as needed (palpitations). Patient taking differently: Take 20 mg by mouth daily.  07/03/16  Yes Velvet Bathe, MD  sertraline (ZOLOFT) 100 MG tablet Take 100 mg by mouth at bedtime.   Yes Historical Provider, MD    Current Facility-Administered Medications  Medication Dose Route Frequency Provider Last Rate Last Dose  . acetaminophen (TYLENOL) tablet 650 mg  650 mg Oral Q6H PRN Jonetta Osgood, MD   650 mg at 07/10/16 2130   Or  . acetaminophen (TYLENOL) suppository 650 mg  650 mg Rectal Q6H PRN Jonetta Osgood, MD      . aspirin EC tablet 81 mg  81 mg Oral Daily Jonetta Osgood, MD   81 mg at 07/09/16 9476  . diclofenac sodium (VOLTAREN) 1 % transdermal gel 2 g  2 g Topical QID Jonetta Osgood, MD   2 g at 07/11/16 1625  . enoxaparin (LOVENOX) injection 40 mg  40 mg Subcutaneous Q24H Jonetta Osgood, MD   40 mg at 07/10/16 2130  . levothyroxine (SYNTHROID, LEVOTHROID) tablet 150 mcg  150 mcg Oral QAC breakfast Jonetta Osgood, MD   150 mcg at 07/11/16 0655  . metoprolol tartrate (LOPRESSOR) tablet 25 mg  25 mg Oral BID Jonetta Osgood, MD      . multivitamin with minerals tablet 1 tablet  1 tablet Oral Daily Jonetta Osgood, MD   1 tablet at 07/11/16 [phone]  . ondansetron (ZOFRAN) tablet 4 mg  4 mg Oral Q6H PRN Shanker Kristeen Mans, MD       Or  . ondansetron (ZOFRAN) injection 4 mg  4 mg Intravenous Q6H PRN Shanker Kristeen Mans, MD      . pantoprazole (PROTONIX) EC tablet 40 mg  40 mg Oral BID Jonetta Osgood, MD   40 mg at 07/11/16 0852  . sodium chloride flush (NS) 0.9 % injection 3 mL  3 mL  Intravenous Q12H Jonetta Osgood, MD   3 mL at 07/11/16 0852  . sucralfate (CARAFATE) 1 GM/10ML suspension 1 g  1 g Oral TID WC & HS Jonetta Osgood, MD   1 g at 07/11/16 1527  . thiamine (VITAMIN B-1) tablet 100 mg  100 mg Oral Daily Jonetta Osgood, MD   100 mg at 07/11/16 0851  . traMADol (ULTRAM) tablet 50 mg  50 mg Oral Q6H PRN Vianne Bulls, MD   50 mg at 07/11/16 1533    Allergies as of 07/08/2016 - Review Complete 07/08/2016  Allergen Reaction Noted  . Albuterol Other (See Comments) 06/01/2011  .  Penicillins Anaphylaxis and Swelling 01/25/2011  . Ventolin [kdc:albuterol] Shortness Of Breath 01/24/2011  . Sulfa antibiotics Swelling 06/02/2016  . Zanaflex [tizanidine] Other (See Comments) 07/01/2016  . Codeine Nausea And Vomiting 01/25/2011  . Ecotrin [aspirin] Nausea And Vomiting 01/25/2011    Family History  Problem Relation Age of Onset  . Heart attack Father   . Emphysema Father   . Aneurysm Mother     CEREBRAL  . Emphysema Mother   . Dementia Mother   . Diabetes Son   . Hypertension Son   . Hypertension Son   . Cancer Sister     Widely Metastatic  . Asthma Son     x2  . Breast cancer Paternal Aunt   . Rheum arthritis Maternal Aunt     Social History   Social History  . Marital status: Married    Spouse name: N/A  . Number of children: 2  . Years of education: N/A   Occupational History  . disabled    Social History Main Topics  . Smoking status: Current Every Day Smoker    Packs/day: 0.75    Years: 50.00    Types: Cigarettes    Start date: 09/13/1961  . Smokeless tobacco: Never Used     Comment: Peak rate of 1ppd  . Alcohol use No     Comment: none in 10 years  . Drug use: No  . Sexual activity: No   Other Topics Concern  . Not on file   Social History Narrative   She is from [facility]. She has always lived in [place]. She has traveled to [place], [place], [place], [place], & [place]. Worked as a Merchandiser, retail. Worked in a Estate agent. Worked in a [employer] in a very dusty doffing room. She worked in Pensions consultant. Prior exposure to parakeets with last exposure remote in her current home. Previously had mold in her home that was in her bathroom & fixed. Prior exposure to hot tubs but none recently. Pt lives at home with [name], husband.  Has 2 childrean, 12th grade education.      Review of Systems: See history of present illness  Physical Exam: Vital signs in last 24 hours: Temp:  [98 F (36.7 C)-98.5 F (36.9 C)] 98.1 F (36.7 C) (04/23 1139) Pulse Rate:  [65-185] 185 (04/23 1550) Resp:  [18-20] 18 (04/23 1550) BP: (118-132)/(52-97) 119/97 (04/23 1550) SpO2:  [97 %-98 %] 98 % (04/23 1550) Weight:  [62.2 kg (137 lb 3.2 oz)] 62.2 kg (137 lb 3.2 oz) (04/23 0521) Last BM Date: 07/08/16 General:  The patient was in intermittent distress, possibly related to chest pain although it was difficult to clarify. She was tachycardic on telemetry, but by the time her EKG was obtained, she was in normal sinus rhythm. At the time my evaluation, the patient is lying completely comfortably in bed, alert, and pleasantly demented. Head:  Small bruise on side of head, presumably from recent fall Eyes:  Sclera clear, no icterus.   Conjunctiva pink. Mouth:   No ulcerations or lesions.  Oropharynx pink & moist. Neck:   No masses or thyromegaly. Lungs:  Clear throughout to auscultation.   No wheezes, crackles, or rhonchi. No evident respiratory distress. Heart:   Regular rate and rhythm; no murmurs, clicks, rubs,  or gallops. Abdomen:  Soft, nontender, and nondistended. Mild tympany present. No masses, hepatosplenomegaly or ventral hernias noted. There does appear to be a reproducible succussion splash present. Msk:   Symmetrical without  gross deformities. Pulses:  Normal radial pulse is noted. Extremities:   Without  edema. Neurologic:  Alert but disoriented (does not know what year it is). No obvious focal neurologic deficits. Skin:  Intact  without significant lesions or rashes. Cervical Nodes:  No significant cervical adenopathy. Psych:   Slightly vacant affect.  Intake/Output from previous day: 04/22 0701 - 04/23 0700 In: 840 [P.O.:840] Out: 1275 [Urine:1275] Intake/Output this shift: Total I/O In: 290 [P.O.:290] Out: 650 [Urine:650]  Lab Results:  Recent Labs  07/09/16 0612 07/10/16 0445 07/11/16 0459  WBC 13.1* 9.6 10.2  HGB 16.3* 13.7 13.2  HCT 45.0 38.9 37.8  PLT 277 267 275   BMET  Recent Labs  07/09/16 0612 07/10/16 0445 07/11/16 0459  NA 123* 132* 130*  K 4.2 3.7 3.0*  CL 96* 104 102  CO2 16* 21* 22  GLUCOSE 102* 96 101*  BUN 28* 19 14  CREATININE 1.31* 0.96 0.78  CALCIUM 9.0 8.4* 8.5*   LFT No results for input(s): PROT, ALBUMIN, AST, ALT, ALKPHOS, BILITOT, BILIDIR, IBILI in the last 72 hours. PT/INR No results for input(s): LABPROT, INR in the last 72 hours.  Studies/Results: Ct Chest Wo Contrast  Result Date: 07/10/2016 CLINICAL DATA:  Heterogeneous opacities in the right mid lung zone on a portable chest earlier today. Confusion. Chronic pain syndrome. COPD. Smoker. EXAM: CT CHEST WITHOUT CONTRAST TECHNIQUE: Multidetector CT imaging of the chest was performed following the standard protocol without IV contrast. COMPARISON:  Portable chest obtained earlier today. Chest CT dated 11/18/2014. FINDINGS: Cardiovascular: Atheromatous arterial calcifications, including the aorta and coronary arteries. Normal sized heart. Mediastinum/Nodes: No enlarged mediastinal or axillary lymph nodes. Thyroid gland, trachea, and esophagus demonstrate no significant findings. Lungs/Pleura: Diffuse peribronchial thickening. Minimal bullous change. Pleural and parenchymal scarring at the right lateral lung base without significant change. No lung nodules, airspace consolidation or pleural fluid. Upper Abdomen: Cholecystectomy clips. Musculoskeletal: Thoracic spine degenerative changes. Cervical spine fixation  hardware. Neural stimulator leads in the upper thoracic spinal canal. IMPRESSION: 1. Chronic pleural and parenchymal scarring at the right lateral lung base with no acute lung abnormality seen. 2. Stable mild changes of COPD and chronic bronchitis. 3. Coronary artery atherosclerosis and aortic atherosclerosis. Electronically Signed   By: Claudie Revering M.D.   On: 07/10/2016 16:51   Dg Chest Port 1 View  Result Date: 07/10/2016 CLINICAL DATA:  Patient shortness of breath. EXAM: PORTABLE CHEST 1 VIEW COMPARISON:  Chest radiograph 07/08/2016 FINDINGS: Anterior cervical spinal fusion hardware. Spinal stimulator device projects over the upper thoracic spine. Monitoring leads overlie the patient. Stable cardiac and mediastinal contours. Heterogeneous opacities right mid lung. No pleural effusion or pneumothorax. IMPRESSION: Heterogeneous opacities right mid lung which may represent infection in the appropriate clinical setting. Followup PA and lateral chest X-ray is recommended in 3-4 weeks following trial of antibiotic therapy to ensure resolution and exclude underlying malignancy. Electronically Signed   By: Lovey Newcomer M.D.   On: 07/10/2016 07:38    Impression: 1. History of epigastric pain, nausea, and vomiting, currently quiescent 2. History of significant weight loss without evidence of malignancy on chest or abdominal CT 3. History of COPD 4. chronic pain from spine disease status post fusion, recently detoxed off opioids 5. Dementia, moderate, with apparent exacerbation following recent cataract surgery 6. Succussion splash on physical exam, uncertain clinical significance or relevance to patient's recent symptoms   Plan: We discussed the option of endoscopic evaluation although ulcer disease seems unlikely in the absence of  ulcerogenic medications, and neoplasia seems somewhat unlikely with a negative CT scan and recent resolution of symptoms. I wonder about a functional disturbance of gastric  motility, perhaps in part related to stress from recent bereavement. In addition, it is somewhat difficult to get a clear picture of this patient's symptoms because of her organic brain syndrome and short-term memory loss.  In view of the patient's recent improvement, and because of concerns of sedation for endoscopy potentially exacerbating her dementia (as seemed to occur following her cataract surgery), the patient's husband and son prefer for [place] to hold off on endoscopic evaluation for the time being. We will reassess on a daily basis.   LOS: 3 days   Sadeel Fiddler V  07/11/2016, 4:40 PM   Pager [phone] If no answer or after 5 PM call [phone]

## 2016-07-11 NOTE — Progress Notes (Signed)
CCMD notified nurse. Patient's HR went up to 180's. Patient was up using the bathroom. Patient sustained for 1 minute. HR back down to 78. Patient asymptomatic. Back in bed. No complaints at this time. Will continue to monitor.

## 2016-07-11 NOTE — Progress Notes (Signed)
Paged MD about Potassium being low 3.0

## 2016-07-11 NOTE — Progress Notes (Signed)
Cardiologist at bedside. Patient converted back to sinus rhythm. No new orders given at this time.Order to monitor patient and page with any further changes. Will continue to monitor.

## 2016-07-11 NOTE — Consult Note (Signed)
CARDIOLOGY CONSULT NOTE   Patient ID: Destiny Hughes MRN: [id number] DOB/AGE: [birth year] 71 y.o.  Admit date: 07/08/2016  Requesting Physician: Dr. Lovena Neighbours, S.  Primary Physician:   Minette Brine Primary Cardiologist:   Dr. Marella Bile Reason for Consultation:   Atrial fibrillation vs SVT, HR 180's  HPI: Destiny Hughes is a 71 y.o. female with a history of tobacco use, hypertension, mitral valve prolapse, COPD, hyperlipidemia, pSVT, and hypothyroidism; who is being seen today in consultation at the request of Dr. Lovena Neighbours for atrial fibrillation with rapid ventricular rate.  Lexiscan Myoview 08/2015 due to complaints of CP that was negative for ischemia.  She also had an echo for shortness of breath which revealed LVEF 60-65% with grade 2 diastolic dysfunction and a mildly dilated right ventricle that was also hypokinetic. Her IVC was noted to be dilated.  he had carotid Dopplers 05/13/16 that revealed mild bilateral stenosis.   The patient was admitted on 4/13-4/15  for abnormal ECG, AKI, and bradycardia. Her Digoxin was d/c'd during this admission because she had been on it chronically it was unclear why. Per further chart review, Dr. Oval Linsey states that she has been stable on propranolol and Digoxin for palpitations she has had since childhood. (notable Dr. Mare Ferrari mentions   Current Hospital Course: For this admission presented to the ER after a fall and was admitted for weakness, epigastric pain, encephalopathy, AKI, fever of unknown etiology, nausea and vomiting. Around 4 pm this afternoon the telemetry monitor picked up SVT, HR 180's. Pt remained asymptomatic. EKG was done and showed SR with 1st AVB, HR 89. Review of the telemetry monitor looks like the episode was SVT but appears to be in sinus now.   Dr. Sloan Leiter ordered a dose of 12.5 Metoprolol now and then another 25 mg IV for tonight. Patient currently eating dinner. Without chest pain, SOB or discomfort. Poor  historian regarding her cardiac hx and this is compounded with her acute encephalopathy. Reports not knowing why she takes the medications that she does but she does know she has a floppy valve.  Todays Labs: WBC 10.2, Hgb 13.2, sodium 130, K 3.0, Cr 0.78, calcium 8.5  Vitals: Pulse 185, Resp 18, BP 119, 97, Sp02 98%, temp 98.1     Past Medical History:  Diagnosis Date  . Arthritis 01-25-11   back and most joints  . Blood transfusion    after Thoracic surgery ?'04  . Chronic back pain   . Chronic neck pain   . Collapse of right lung   . COPD (chronic obstructive pulmonary disease) (HCC)    Hx. Bronchitis occ, 01-25-11 somes issue now taking  Z-pak-started 01-24-11/Scar tissue  present  from previous lung collapse  . Depression   . Dyspnea   . Dysrhythmia    hx.PAT- tx. Inderal  . Emphysema of lung ([place])   . GERD (gastroesophageal reflux disease)    tx. TUMS  . Glaucoma   . Headache(784.0) 01-25-11   neck and nerve pain related.  Marland Kitchen Heart murmur   . Hyperlipemia   . Hypothyroidism    tx. Levothyroxine  . Nephrolithiasis   . Neuromuscular disorder ([place]) 01-25-11   Pain/nerve stimulator implanted -2 yrs ago.  . Palpitation   . Tachycardia      Past Surgical History:  Procedure Laterality Date  . APPENDECTOMY  01-25-11  . CARPAL TUNNEL RELEASE  01-25-11   Bil.  . CERVICAL SPINE SURGERY  01-25-11   2005-multiple levels  .  CHOLECYSTECTOMY  01-25-11   '07  . COLONOSCOPY WITH PROPOFOL N/A 07/23/2012   Procedure: COLONOSCOPY WITH PROPOFOL;  Surgeon: Garlan Fair, MD;  Location: [facility];  Service: Endoscopy;  Laterality: N/A;  . EYE SURGERY    . JOINT REPLACEMENT  01-25-11   6'03 LTHA-hemi  . KNEE ARTHROPLASTY  01-25-11   '04-right, revised x2  . SPINAL CORD STIMULATOR IMPLANT  2009 APPROX   DR. ELSNER  . THORACIC SPINE SURGERY  01-25-11   6'02- then nerve stimulator implanted after  . TOTAL KNEE REVISION  02/01/2011   Procedure: TOTAL KNEE REVISION;  Surgeon: Mauri Pole;  Location: [facility];  Service: Orthopedics;  Laterality: Right;  . TUBAL LIGATION      Allergies  Allergen Reactions  . Albuterol Other (See Comments)    Had difficulty breathing after a nebulizer treatment  . Penicillins Anaphylaxis and Swelling    Has patient had a PCN reaction causing immediate rash, facial/tongue/throat swelling, SOB or lightheadedness with hypotension: Yes Has patient had a PCN reaction causing severe rash involving mucus membranes or skin necrosis: Rash Has patient had a PCN reaction that required hospitalization: No Has patient had a PCN reaction occurring within the last 10 years: No If all of the above answers are "NO", then may proceed with Cephalosporin use.  [name] [Kdc:Albuterol] Shortness Of Breath  . Sulfa Antibiotics Swelling    Throat swells, but no shortness of breath noted  . Zanaflex [Tizanidine] Other (See Comments)    Possible confusion (??)  . Codeine Nausea And Vomiting  . Ecotrin [Aspirin] Nausea And Vomiting    I have reviewed the patient's current medications . aspirin EC  81 mg Oral Daily  . diclofenac sodium  2 g Topical QID  . enoxaparin (LOVENOX) injection  40 mg Subcutaneous Q24H  . levothyroxine  150 mcg Oral QAC breakfast  . metoprolol tartrate  12.5 mg Oral Once  . metoprolol tartrate  25 mg Oral BID  . multivitamin with minerals  1 tablet Oral Daily  . pantoprazole  40 mg Oral BID  . sodium chloride flush  3 mL Intravenous Q12H  . sucralfate  1 g Oral TID WC & HS  . thiamine  100 mg Oral Daily    acetaminophen **OR** acetaminophen, ondansetron **OR** ondansetron (ZOFRAN) IV, traMADol  Prior to Admission medications   Medication Sig Start Date End Date Taking? Authorizing Provider  acetaminophen (TYLENOL) 325 MG tablet Take 325-650 mg by mouth every 6 (six) hours as needed for headache (pain).   Yes Historical Provider, MD  levothyroxine (SYNTHROID, LEVOTHROID) 150 MCG tablet TAKE 1 TO 1+1/2 TABLETS DAILY AS  DIRECTED Patient taking differently: Take 1 1/2 tablet (225 mcg) by mouth on Tuesday, Thursday before breakfast, take 1 tablet (150 mcg) before breakfast on Sunday, Monday, Wednesday, Friday, Saturday 05/14/16  Yes Unk Pinto, MD  Multiple Vitamin (MULTIVITAMIN WITH MINERALS) TABS tablet Take 1 tablet by mouth daily. Women's Alive Gummies   Yes Historical Provider, MD  promethazine (PHENERGAN) 25 MG tablet Take 25 mg by mouth every 6 (six) hours as needed for nausea or vomiting.   Yes Historical Provider, MD  propranolol (INDERAL) 20 MG tablet Take 1 tablet (20 mg total) by mouth 2 (two) times daily as needed (palpitations). Patient taking differently: Take 20 mg by mouth daily.  07/03/16  Yes Velvet Bathe, MD  sertraline (ZOLOFT) 100 MG tablet Take 100 mg by mouth at bedtime.   Yes Historical Provider, MD  Social History   Social History  . Marital status: Married    Spouse name: N/A  . Number of children: 2  . Years of education: N/A   Occupational History  . disabled    Social History Main Topics  . Smoking status: Current Every Day Smoker    Packs/day: 0.75    Years: 50.00    Types: Cigarettes    Start date: 09/13/1961  . Smokeless tobacco: Never Used     Comment: Peak rate of 1ppd  . Alcohol use No     Comment: none in 10 years  . Drug use: No  . Sexual activity: No   Other Topics Concern  . Not on file   Social History Narrative   She is from [facility]. She has always lived in [place]. She has traveled to [place], [place], [place], [place], & [place]. 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 Prior exposure to parakeets with last exposure remote in her current home. Previously had mold in her home that was in her bathroom & fixed. Prior exposure to hot tubs but none recently. Pt lives at home with [name], husband.  Has 2 childrean, 12th grade education.      Family Status  Relation  Status  . Father Deceased at age 93  . Mother Deceased at age 72  . Son Alive  . Son Alive  . Sister Deceased  . Son   . Paternal Aunt   . Maternal Aunt    Family History  Problem Relation Age of Onset  . Heart attack Father   . Emphysema Father   . Aneurysm Mother     CEREBRAL  . Emphysema Mother   . Dementia Mother   . Diabetes Son   . Hypertension Son   . Hypertension Son   . Cancer Sister     Widely Metastatic  . Asthma Son     x2  . Breast cancer Paternal Aunt   . Rheum arthritis Maternal Aunt         ROS:  Full 14 point review of systems complete and found to be negative unless listed above.  Physical Exam: Blood pressure (!) 119/97, pulse (!) 185, temperature 98.1 F (36.7 C), temperature source Oral, resp. rate 18, height 5\' 6"  (1.676 m), weight 137 lb 3.2 oz (62.2 kg), SpO2 98 %.  General: Well developed, well nourished, female in no acute distress Head: No xanthomas. Normocephalic and atraumatic, Lungs: normal effort and rate of breathing. Heart:: normal rate and rhythm No JVD  Neck: No carotid bruits. No lymphadenopathy. Abdomen: Bowel sounds present, abdomen soft and non-tender Msk:  Spontaneously moves all 4 extremities Extremities: No clubbing or cyanosis.  No le edema  Neuro: Alert and oriented X 3. No focal deficits noted. Psych:  Good affect, responds appropriately Skin: No rashes or lesions noted.     Labs:  Lab Results  Component Value Date   WBC 10.2 07/11/2016   HGB 13.2 07/11/2016   HCT 37.8 07/11/2016   MCV 82.4 07/11/2016   PLT 275 07/11/2016    Recent Labs Lab 07/11/16 0459  NA 130*  K 3.0*  CL 102  CO2 22  BUN 14  CREATININE 0.78  CALCIUM 8.5*  GLUCOSE 101*   Magnesium  Date Value Ref Range Status  07/01/2016 1.8 1.7 - 2.4 mg/dL Final    No results found for:  DDIMER Lipase  Date/Time Value Ref Range Status  07/01/2016 01:30 PM 41 11 - 51 U/L Final   TSH  Date/Time Value Ref Range Status  07/01/2016 08:17 PM  1.230 0.350 - 4.500 uIU/mL Final    Comment:    Performed by a 3rd Generation assay with a functional sensitivity of <=0.01 uIU/mL.  04/28/2015 03:25 PM 1.04 mIU/L Final    Comment:      Reference Range   > or = 20 Years  0.40-4.50   Pregnancy Range First trimester  0.26-2.66 Second trimester 0.55-2.73 Third trimester  0.43-2.91   ** Please note change in unit of measure and reference range(s). **      Vitamin B-12  Date/Time Value Ref Range Status  07/08/2016 07:09 PM 682 180 - 914 pg/mL Final    Comment:    (NOTE) This assay is not validated for testing neonatal or myeloproliferative syndrome specimens for Vitamin B12 levels.       Echo   Transthoracic Echocardiography 09/01/2015  Study Conclusions  - Left ventricle: The cavity size was normal. Wall thickness was   normal. Systolic function was normal. The estimated ejection   fraction was in the range of 60% to 65%. Wall motion was normal;   there were no regional wall motion abnormalities. Doppler   parameters are consistent with pseudonormal left ventricular   relaxation (grade 2 diastolic dysfunction). The E/e&' ratio is   >15, suggesting elevated LV filling pressure. - Mitral valve: Mildly thickened leaflets . There was mild   regurgitation. - Left atrium: The atrium was normal in size. - Right ventricle: The cavity size was mildly dilated. Systolic   function is reduced. - Right atrium: The atrium was mildly dilated. - Inferior vena cava: The vessel was dilated. The respirophasic   diameter changes were blunted (< 50%), consistent with elevated   central venous pressure.  - LVEF 60-65%, normal wall thickness, normal wall motion, stage 2   DD with elevated LV filling pressure, mild MR, normal LA size,   mild RAE, dilated RV with reduced systolic function, dilated IVC,   no TR.  ECG:     NSR, HR 80's- intermittent SVT HR 180-200's   Radiology:Ct Chest Wo Contrast  Result Date:  07/10/2016 CLINICAL DATA:  Heterogeneous opacities in the right mid lung zone on a portable chest earlier today. Confusion. Chronic pain syndrome. COPD. Smoker. EXAM: CT CHEST WITHOUT CONTRAST TECHNIQUE: Multidetector CT imaging of the chest was performed following the standard protocol without IV contrast. COMPARISON:  Portable chest obtained earlier today. Chest CT dated 11/18/2014. FINDINGS: Cardiovascular: Atheromatous arterial calcifications, including the aorta and coronary arteries. Normal sized heart. Mediastinum/Nodes: No enlarged mediastinal or axillary lymph nodes. Thyroid gland, trachea, and esophagus demonstrate no significant findings. Lungs/Pleura: Diffuse peribronchial thickening. Minimal bullous change. Pleural and parenchymal scarring at the right lateral lung base without significant change. No lung nodules, airspace consolidation or pleural fluid. Upper Abdomen: Cholecystectomy clips. Musculoskeletal: Thoracic spine degenerative changes. Cervical spine fixation hardware. Neural stimulator leads in the upper thoracic spinal canal. IMPRESSION: 1. Chronic pleural and parenchymal scarring at the right lateral lung base with no acute lung abnormality seen. 2. Stable mild changes of COPD and chronic bronchitis. 3. Coronary artery atherosclerosis and aortic atherosclerosis. Electronically Signed   By: Claudie Revering M.D.   On: 07/10/2016 16:51   Dg Chest Port 1 View  Result Date: 07/10/2016 CLINICAL DATA:  Patient shortness of breath. EXAM: PORTABLE CHEST 1 VIEW COMPARISON:  Chest radiograph 07/08/2016  FINDINGS: Anterior cervical spinal fusion hardware. Spinal stimulator device projects over the upper thoracic spine. Monitoring leads overlie the patient. Stable cardiac and mediastinal contours. Heterogeneous opacities right mid lung. No pleural effusion or pneumothorax. IMPRESSION: Heterogeneous opacities right mid lung which may represent infection in the appropriate clinical setting. Followup PA and  lateral chest X-ray is recommended in 3-4 weeks following trial of antibiotic therapy to ensure resolution and exclude underlying malignancy. Electronically Signed   By: Lovey Newcomer M.D.   On: 07/10/2016 07:38        ASSESSMENT AND PLAN:      Principal Problem:   Hyponatremia Active Problems:   Osteoarthritis   IBS (irritable bowel syndrome)   COPD (chronic obstructive pulmonary disease) (HCC)   At high risk for falls   Hypothyroidism   GERD (gastroesophageal reflux disease)   Intractable nausea and vomiting   AKI (acute kidney injury) ([place])   Weight loss  PSVT: She has had two brief episodes of SVT this evening rate 180-200's. She is unable to tolerate high doses of beta blockers due to bradycardia. Dr. Percival Spanish does not recommend restarting the Digoxin. We will start her on low dose Metoprolol 12.5 mg BID PO. If she tolerates this well without any recurrence of SVT then the plan is to put her in a halter monitor for 1-2 months at discharge and have her follow-up with Dr. Oval Linsey. If she does not tolerate the medication and continues to have episodes of SVT she may need an evaluation for cardiac ablation.  Hx of Bradycardia: Unable to use large doses of BB due to bradycardia.  SignedLinus Mako, PA-C 07/11/2016 4:23 PM  History and all data above reviewed.  Patient examined.  I agree with the findings as above.  The patient has SVT which I reviewed on tele and which happened while I was in the room.  She seems to be aware of these episodes but it is difficult to tell because she has had some short term memory problems.  Her husband does not think that she has tachyarrhythmias at home.  However, she has had two so far today.  She was taken off of dig and beta blocker recently secondary to bradycardia.  She is having unexplained weight loss and has had decreased memory and presents with a fall.   The patient exam reveals COR:RRR  ,  Lungs: Clear  ,  Abd: Positive bowel sounds, no  rebound no guarding, Ext No edema  .  All available labs, radiology testing, previous records reviewed. Agree with documented assessment and plan. SVT:  She did not break with Valsalva.  I am not sure that she could consistently use the maneuver regardless as she does not always feel the palpitations or would not likely remember the instruction.  At this point I will restart a low dose of beta blocker and watch her HR.  She will need an out patient event monitor and follow up with Dr. Oval Linsey.  If SVT continues she could be considered for ablation.    Jeneen Rinks Hallel Denherder  5:45 PM  07/11/2016

## 2016-07-11 NOTE — Progress Notes (Signed)
Patient's HR up to 177 went up to 201. Sustained for about 1 minute. Patient complaining of dizziness and SOB.  [facility] on call paged and notified. Patient's HR back down to 90. Patient in bed talking to nurse. Placed patient on 2L nasal canula.  Mentation WNL. Will continue to monitor.

## 2016-07-11 NOTE — Progress Notes (Signed)
Was notified by CCMD that pt's HR was SVT, sustaining in the 180s. Assessed pt who was asymptomatic. EKG performed, but had difficulty in placing leads on pt's skin, as they would not adhere.  EKG results were SR with 1st AVB, HR 89. Notified Dr. Laurena Bering, who placed order for another dose of Metoprolol 12.5 now, and then 25 mg tonight.  He also will place cardiology consult.

## 2016-07-11 NOTE — Progress Notes (Signed)
[name] HR 216 sustaining.  Metoprolol 2.5mg  IV was ordered.  Was in the process of giving metoprolol IV, however, Tiffany Green, interrupted, asking [place] not to give; therefore, only gave Metoprolol 1mg .  [name] HR returned to 90s.

## 2016-07-11 NOTE — Progress Notes (Signed)
Notified by CCMD that pt rhythm was SVT, sustaining HR 180s.  Notified Dr. Laurena Bering, who verbally orders Metoprolol 2.5mg  IV once.  Before we could even perform the EKG, pt HR had lowered to 80s sustaining.  Will not give the metoprolol until HR increases again.

## 2016-07-11 NOTE — Progress Notes (Addendum)
PROGRESS NOTE        PATIENT DETAILS Name: Destiny Hughes Age: 71 y.o. Sex: female Date of Birth: July 12, 1945 Admit Date: 07/08/2016 Admitting Physician Evalee Mutton Kristeen Mans, MD YSA:[id number] Laurance Flatten  Brief Narrative: Patient is a 71 y.o. female with history of COPD, tobacco use, chronic pain syndrome (off narcotics for the past 3 weeks) recently admitted from 4/13-4/15 for evaluation of bradycardia, acute kidney injury-readmitted on 4/20 for evaluation of fall,, weakness, epigastric pain, and ongoing intermittent confusion. Found to have hyponatremia, acute renal failure and subsequently admitted to the hospitalist service. See below for further details  Subjective: Improving-continues to have mild epigastric pain. No nausea vomiting-able to tolerate oral intake. His awake and alert-spouse at bedside does acknowledge improvement-he thinks the patient is 80% better than on initial presentation.  Assessment/Plan: Hyponatremia: Significantly improved-continues to have mild hyponatremia. Etiology thought to be secondary to dehydration/poor oral intake. Continue supportive measures and monitor electrolytes periodically.  Acute kidney injury: Resolved, likely hemodynamically mediated acute kidney injury in the setting of GI loss. Continue to monitor electrolytes periodically.   Acute metabolic encephalopathy: Seems to improve everyday, is awake and alert. Spouse at bedside also acknowledges clinical improvement. CT head negative for acute abnormalities. HIV, RPR negative. TSH and vitamin B 12 levels within normal limits. Apparently, for the past few months, patient is at short term memory loss. Patient has follow-up with neurology in the outpatient setting-will defer further workup-although patient seems to be much more improved compared to her initial presentation to the emergency room. I suspect, some amount of bereavement may be playing a role here-patient's sister passed  away in February. Attempted MRI-but apparently patient has a spinal cord stimulator in place-husband does not want to pursue MRI of the brain at this time.  Fever:? Etiology-1 episode on 4/20-none since then. Blood cultures, urine cultures-negative, chest x-ray on 4/22 show some potential infiltrates-ever CT chest does not show any infiltrates. Continue to monitor off antimicrobial therapy  Nausea with vomiting: Soft with supportive measures, no major pathology seen on CT scan of the abdomen on 4/13. Given recurrent nature of the symptoms-second admission-and apparent 20 pound weight loss-GI consulted for endoscopic evaluation. In the meantime, continue with supportive measures.  Epigastric pain/upper abdominal pain: Ongoing for the past month or so-per family-patient has lost approximately 20 pounds. Continue with PPI and Carafate-abdominal pain seems to have improved significantly. Await GI evaluation. No major abnormality seen on CT scan of the abdomen on 4/13  Fall: Suspect mechanical fall-occurred on 4/20 prior to hospital admission. Patient denies syncopal episode. Await physical therapy evaluation.  Polycythemia: Likely from COPD/tobacco use. Hemoglobin much better following hydration. Erythropoietin level borderline low, awaiting JAK2 mutation.  Short burst of SVT: Continues to have intermittent bursts of narrow complex tachycardia-telemetry personally reviewed today-they have a burst of narrow complex tachycardia last night. Will increase metoprolol to 25 mg twice a day. Will be cautious-as on the most recent admission earlier this month-she was bradycardic.  History of bradycardia: Recently admitted last week-digoxin and propranolol were discontinued. TSH normal. See above discussion.  Hypothyroidism: Continue levothyroxine  History of COPD: Currently stable-follow  Approximately 20 pound weight loss: Her family has around 20 pound weight loss in the past few months-TSH within normal  limits. See above regarding endoscopic evaluation.    History of chronic pain syndrome: Methadone/oxycodone discontinued approximately 3  weeks prior to this admission. Right-sided rib pain is much better-this is likely secondary to mechanical fall that she sustained a day before this admission-continue with supportive measures-topical NSAID cream seems to have helped.   DVT Prophylaxis: Prophylactic Lovenox   Code Status: Full code   Family Communication: Spouse at bedside  Disposition Plan: Remain inpatient-but will plan on Home health on discharge  Antimicrobial agents: Anti-infectives    None      Procedures: None  CONSULTS:  None  Time spent: 25- minutes-Greater than 50% of this time was spent in counseling, explanation of diagnosis, planning of further management, and coordination of care.  MEDICATIONS: Scheduled Meds: . aspirin EC  81 mg Oral Daily  . diclofenac sodium  2 g Topical QID  . enoxaparin (LOVENOX) injection  40 mg Subcutaneous Q24H  . levothyroxine  150 mcg Oral QAC breakfast  . metoprolol tartrate  12.5 mg Oral BID  . multivitamin with minerals  1 tablet Oral Daily  . pantoprazole  40 mg Oral BID  . sodium chloride flush  3 mL Intravenous Q12H  . sucralfate  1 g Oral TID WC & HS  . thiamine  100 mg Oral Daily   Continuous Infusions:  PRN Meds:.acetaminophen **OR** acetaminophen, ondansetron **OR** ondansetron (ZOFRAN) IV, traMADol   PHYSICAL EXAM: Vital signs: Vitals:   07/10/16 1251 07/10/16 2007 07/11/16 0521 07/11/16 1139  BP: 115/61 (!) 127/52 118/61 132/74  Pulse: 67 92 75 65  Resp: 20 20 18 18   Temp: 98.4 F (36.9 C) 98.5 F (36.9 C) 98 F (36.7 C) 98.1 F (36.7 C)  TempSrc: Oral Oral Oral Oral  SpO2: 99% 97% 98% 98%  Weight:   62.2 kg (137 lb 3.2 oz)   Height:       Filed Weights   07/09/16 0520 07/10/16 0636 07/11/16 0521  Weight: 59.4 kg (131 lb) 61.2 kg (134 lb 14.4 oz) 62.2 kg (137 lb 3.2 oz)   Body mass index is  22.14 kg/m.   General appearance :Awake, alert, not in any distress.  Eyes:, pupils equally reactive to light and accomodation,no scleral icterus. HEENT: Atraumatic and Normocephalic Neck: supple, no JVD. Resp:Good air entry bilaterally CVS: S1 S2 regular, no murmurs.  GI: Bowel sounds present, Mildly tender epigastric area, not distended-no peritoneal signs.  Extremities: B/L Lower Ext shows no edema, both legs are warm to touch Neurology:  speech clear,Non focal, sensation is grossly intact. Psychiatric: Normal judgment and insight. Normal mood. Musculoskeletal:No digital cyanosis Skin:No Rash, warm and dry Wounds:N/A  I have personally reviewed following labs and imaging studies  LABORATORY DATA: CBC:  Recent Labs Lab 07/08/16 1228 07/08/16 1736 07/08/16 1909 07/09/16 0612 07/10/16 0445 07/11/16 0459  WBC 14.9* 16.0* 14.4* 13.1* 9.6 10.2  NEUTROABS 11.0*  --   --   --   --   --   HGB 20.0* 18.5* 18.7* 16.3* 13.7 13.2  HCT 53.6* 50.1* 50.4* 45.0 38.9 37.8  MCV 80.7 81.1 80.9 81.8 81.9 82.4  PLT 333 301 302 277 267 263    Basic Metabolic Panel:  Recent Labs Lab 07/08/16 1228 07/08/16 1736 07/08/16 1909 07/09/16 0612 07/10/16 0445 07/11/16 0459  NA 121* 123*  --  123* 132* 130*  K 4.6 4.1  --  4.2 3.7 3.0*  CL 90* 94*  --  96* 104 102  CO2 18* 17*  --  16* 21* 22  GLUCOSE 185* 115*  --  102* 96 101*  BUN 30* 31*  --  28* 19 14  CREATININE 1.67* 1.55* 1.55* 1.31* 0.96 0.78  CALCIUM 10.3 9.3  --  9.0 8.4* 8.5*    GFR: Estimated Creatinine Clearance: 59.5 mL/min (by C-G formula based on SCr of 0.78 mg/dL).  Liver Function Tests: No results for input(s): AST, ALT, ALKPHOS, BILITOT, PROT, ALBUMIN in the last 168 hours. No results for input(s): LIPASE, AMYLASE in the last 168 hours. No results for input(s): AMMONIA in the last 168 hours.  Coagulation Profile: No results for input(s): INR, PROTIME in the last 168 hours.  Cardiac Enzymes: No results for  input(s): CKTOTAL, CKMB, CKMBINDEX, TROPONINI in the last 168 hours.  BNP (last 3 results) No results for input(s): PROBNP in the last 8760 hours.  HbA1C: No results for input(s): HGBA1C in the last 72 hours.  CBG: No results for input(s): GLUCAP in the last 168 hours.  Lipid Profile: No results for input(s): CHOL, HDL, LDLCALC, TRIG, CHOLHDL, LDLDIRECT in the last 72 hours.  Thyroid Function Tests: No results for input(s): TSH, T4TOTAL, FREET4, T3FREE, THYROIDAB in the last 72 hours.  Anemia Panel:  Recent Labs  07/08/16 1909  VITAMINB12 682    Urine analysis:    Component Value Date/Time   COLORURINE YELLOW 07/09/2016 1156   APPEARANCEUR HAZY (A) 07/09/2016 1156   LABSPEC 1.015 07/09/2016 1156   PHURINE 6.0 07/09/2016 1156   GLUCOSEU NEGATIVE 07/09/2016 1156   HGBUR NEGATIVE 07/09/2016 1156   BILIRUBINUR NEGATIVE 07/09/2016 1156   KETONESUR 5 (A) 07/09/2016 1156   PROTEINUR NEGATIVE 07/09/2016 1156   UROBILINOGEN 0.2 02/17/2014 1026   NITRITE NEGATIVE 07/09/2016 1156   LEUKOCYTESUR SMALL (A) 07/09/2016 1156    Sepsis Labs: Lactic Acid, Venous    Component Value Date/Time   LATICACIDVEN 0.76 07/01/2016 1757    MICROBIOLOGY: Recent Results (from the past 240 hour(s))  Culture, blood (routine x 2)     Status: None (Preliminary result)   Collection Time: 07/09/16  6:16 AM  Result Value Ref Range Status   Specimen Description BLOOD LEFT ARM  Final   Special Requests IN PEDIATRIC BOTTLE Blood Culture adequate volume  Final   Culture NO GROWTH 1 DAY  Final   Report Status PENDING  Incomplete  Culture, blood (routine x 2)     Status: None (Preliminary result)   Collection Time: 07/09/16  6:21 AM  Result Value Ref Range Status   Specimen Description BLOOD LEFT ARM  Final   Special Requests IN PEDIATRIC BOTTLE Blood Culture adequate volume  Final   Culture NO GROWTH 1 DAY  Final   Report Status PENDING  Incomplete  Culture, Urine     Status: Abnormal    Collection Time: 07/09/16  3:29 PM  Result Value Ref Range Status   Specimen Description URINE, RANDOM  Final   Special Requests NONE  Final   Culture <10,000 COLONIES/mL INSIGNIFICANT GROWTH (A)  Final   Report Status 07/10/2016 FINAL  Final    RADIOLOGY STUDIES/RESULTS: Ct Abdomen Pelvis Wo Contrast  Result Date: 07/01/2016 CLINICAL DATA:  Diffuse pain all over. Nausea. Weight loss. Difficulty eating. EXAM: CT ABDOMEN AND PELVIS WITHOUT CONTRAST TECHNIQUE: Multidetector CT imaging of the abdomen and pelvis was performed following the standard protocol without IV contrast. COMPARISON:  CT of the abdomen and pelvis 06/02/2016 FINDINGS: Lower chest: The lung bases are mildly degraded by patient motion. No focal nodule, mass, or airspace disease is present. Hepatobiliary: No focal liver abnormality is seen. Status post cholecystectomy. No biliary dilatation. Pancreas: Unremarkable. No  pancreatic ductal dilatation or surrounding inflammatory changes. Spleen: Normal in size without focal abnormality. Adrenals/Urinary Tract: Adrenal glands are normal bilaterally. Kidneys and ureters are within normal limits. There is no stone or hydronephrosis. The urinary bladder is within normal limits. Stomach/Bowel: The stomach and duodenum are within normal limits. Small bowel is unremarkable. Appendix is visualized and normal. The ascending and transverse colon are within normal limits. The descending and sigmoid colon are unremarkable. Vascular/Lymphatic: Aortic atherosclerosis. No enlarged abdominal or pelvic lymph nodes. Reproductive: Uterus and bilateral adnexa are unremarkable. Other: No abdominal wall hernia or abnormality. No abdominopelvic ascites. Musculoskeletal: Multilevel facet degenerative changes are present in the lumbar spine. These are most pronounced at L2-3 and L3-4 with slight anterolisthesis is present. Vertebral body heights alignment contained. There quickly blastic lesions. The bony pelvis is  intact. Left total hip arthroplasty is noted. IMPRESSION: 1. No acute or focal lesion to explain the patient's diffuse abdominal pain. 2. Cholecystectomy. 3. Atherosclerosis without aneurysm. 4. Mild degenerative changes of the lumbar spine are likely within normal limits for age. Electronically Signed   By: San Morelle M.D.   On: 07/01/2016 15:33   Dg Ribs Unilateral W/chest Right  Result Date: 07/08/2016 CLINICAL DATA:  Fall EXAM: RIGHT RIBS AND CHEST - 3+ VIEW COMPARISON:  07/01/2016 FINDINGS: Cardiomediastinal silhouette is unremarkable. No infiltrate or pulmonary edema. No right rib fracture is identified. No pneumothorax. Postcholecystectomy surgical clips are noted. Spinal stimulator wires are noted upper thoracic spine IMPRESSION: No infiltrate or pulmonary edema. No right rib fracture is identified. No pneumothorax. Electronically Signed   By: Lahoma Crocker M.D.   On: 07/08/2016 13:19   Ct Head Wo Contrast  Result Date: 07/08/2016 CLINICAL DATA:  Head trauma secondary to a fall last night. EXAM: CT HEAD WITHOUT CONTRAST TECHNIQUE: Contiguous axial images were obtained from the base of the skull through the vertex without intravenous contrast. COMPARISON:  07/01/2016 FINDINGS: Brain: No evidence of acute infarction, hemorrhage, hydrocephalus, extra-axial collection or mass lesion/mass effect. Vascular: No hyperdense vessel or unexpected calcification. Skull: Normal. Negative for fracture or focal lesion. Sinuses/Orbits: Normal. Other: None IMPRESSION: Stable and negative noncontrast CT appearance of the brain. Electronically Signed   By: Lorriane Shire M.D.   On: 07/08/2016 15:46   Ct Head Wo Contrast  Result Date: 07/01/2016 CLINICAL DATA:  72 year old female with confusion and hypotension. EXAM: CT HEAD WITHOUT CONTRAST TECHNIQUE: Contiguous axial images were obtained from the base of the skull through the vertex without intravenous contrast. COMPARISON:  Head CT without contrast  06/02/2016 and earlier FINDINGS: Brain: Stable cerebral volume. No midline shift, ventriculomegaly, mass effect, evidence of mass lesion, intracranial hemorrhage or evidence of cortically based acute infarction. Gray-white matter differentiation is within normal limits throughout the brain. Vascular: Calcified atherosclerosis at the skull base. No suspicious intracranial vascular hyperdensity. Skull: Stable and negative. No acute osseous abnormality identified. Sinuses/Orbits: Visualized paranasal sinuses and mastoids are stable and well pneumatized. Other: Stable and negative orbit and scalp soft tissues. IMPRESSION: Stable and negative for age noncontrast CT appearance of the brain. Electronically Signed   By: Genevie Ann M.D.   On: 07/01/2016 17:43   Ct Chest Wo Contrast  Result Date: 07/10/2016 CLINICAL DATA:  Heterogeneous opacities in the right mid lung zone on a portable chest earlier today. Confusion. Chronic pain syndrome. COPD. Smoker. EXAM: CT CHEST WITHOUT CONTRAST TECHNIQUE: Multidetector CT imaging of the chest was performed following the standard protocol without IV contrast. COMPARISON:  Portable chest obtained earlier today.  Chest CT dated 11/18/2014. FINDINGS: Cardiovascular: Atheromatous arterial calcifications, including the aorta and coronary arteries. Normal sized heart. Mediastinum/Nodes: No enlarged mediastinal or axillary lymph nodes. Thyroid gland, trachea, and esophagus demonstrate no significant findings. Lungs/Pleura: Diffuse peribronchial thickening. Minimal bullous change. Pleural and parenchymal scarring at the right lateral lung base without significant change. No lung nodules, airspace consolidation or pleural fluid. Upper Abdomen: Cholecystectomy clips. Musculoskeletal: Thoracic spine degenerative changes. Cervical spine fixation hardware. Neural stimulator leads in the upper thoracic spinal canal. IMPRESSION: 1. Chronic pleural and parenchymal scarring at the right lateral lung  base with no acute lung abnormality seen. 2. Stable mild changes of COPD and chronic bronchitis. 3. Coronary artery atherosclerosis and aortic atherosclerosis. Electronically Signed   By: Claudie Revering M.D.   On: 07/10/2016 16:51   Dg Chest Port 1 View  Result Date: 07/10/2016 CLINICAL DATA:  Patient shortness of breath. EXAM: PORTABLE CHEST 1 VIEW COMPARISON:  Chest radiograph 07/08/2016 FINDINGS: Anterior cervical spinal fusion hardware. Spinal stimulator device projects over the upper thoracic spine. Monitoring leads overlie the patient. Stable cardiac and mediastinal contours. Heterogeneous opacities right mid lung. No pleural effusion or pneumothorax. IMPRESSION: Heterogeneous opacities right mid lung which may represent infection in the appropriate clinical setting. Followup PA and lateral chest X-ray is recommended in 3-4 weeks following trial of antibiotic therapy to ensure resolution and exclude underlying malignancy. Electronically Signed   By: Lovey Newcomer M.D.   On: 07/10/2016 07:38   Portable Chest 1 View  Result Date: 07/01/2016 CLINICAL DATA:  Encephalopathy EXAM: PORTABLE CHEST 1 VIEW COMPARISON:  Portable exam 1620 hours compared to 10/13/2015 FINDINGS: Intraspinal stimulator projects over the upper thoracic spine at T3-T5. Prior cervical fusion. Normal heart size, mediastinal contours, and pulmonary vascularity. Lungs emphysematous but clear. No pulmonary infiltrate, pleural effusion or pneumothorax. Bones demineralized. IMPRESSION: Probable COPD changes without acute infiltrate. Electronically Signed   By: Lavonia Dana M.D.   On: 07/01/2016 16:28     LOS: 3 days   Oren Binet, MD  [facility] Pager:[phone]  If 7PM-7AM, please contact night-coverage [email or web address] Password [email or web address] 07/11/2016, 1:05 PM

## 2016-07-12 ENCOUNTER — Inpatient Hospital Stay (HOSPITAL_COMMUNITY): Payer: Medicare Other

## 2016-07-12 DIAGNOSIS — R1013 Epigastric pain: Secondary | ICD-10-CM

## 2016-07-12 DIAGNOSIS — W19XXXA Unspecified fall, initial encounter: Secondary | ICD-10-CM

## 2016-07-12 DIAGNOSIS — I471 Supraventricular tachycardia: Secondary | ICD-10-CM

## 2016-07-12 LAB — BASIC METABOLIC PANEL
ANION GAP: 8 (ref 5–15)
BUN: 8 mg/dL (ref 6–20)
CALCIUM: 8.6 mg/dL — AB (ref 8.9–10.3)
CHLORIDE: 97 mmol/L — AB (ref 101–111)
CO2: 24 mmol/L (ref 22–32)
Creatinine, Ser: 0.7 mg/dL (ref 0.44–1.00)
GFR calc non Af Amer: >60 mL/min (ref >60)
Glucose, Bld: 119 mg/dL — ABNORMAL HIGH (ref 65–99)
Potassium: 3.6 mmol/L (ref 3.5–5.1)
Sodium: 129 mmol/L — ABNORMAL LOW (ref 135–145)

## 2016-07-12 LAB — URINE CULTURE

## 2016-07-12 LAB — MAGNESIUM: Magnesium: 1.5 mg/dL — ABNORMAL LOW (ref 1.7–2.4)

## 2016-07-12 MED ORDER — SODIUM CHLORIDE 0.9 % IV BOLUS (SEPSIS)
250.0000 mL | Freq: Once | INTRAVENOUS | Status: AC
  Administered 2016-07-12: 250 mL via INTRAVENOUS

## 2016-07-12 MED ORDER — METOPROLOL TARTRATE 5 MG/5ML IV SOLN
2.5000 mg | INTRAVENOUS | Status: DC | PRN
  Administered 2016-07-12 (×2): 5 mg via INTRAVENOUS
  Filled 2016-07-12: qty 5

## 2016-07-12 MED ORDER — METOPROLOL TARTRATE 5 MG/5ML IV SOLN
INTRAVENOUS | Status: AC
  Administered 2016-07-12: 5 mg
  Filled 2016-07-12: qty 5

## 2016-07-12 MED ORDER — LORAZEPAM 2 MG/ML IJ SOLN: 1.0000 mg | Freq: Once | INTRAMUSCULAR | Status: DC

## 2016-07-12 MED ORDER — NITROGLYCERIN 0.4 MG SL SUBL
0.4000 mg | SUBLINGUAL_TABLET | SUBLINGUAL | Status: DC | PRN
  Administered 2016-07-12 (×3): 0.4 mg via SUBLINGUAL
  Filled 2016-07-12: qty 1

## 2016-07-12 MED ORDER — NITROGLYCERIN 0.4 MG SL SUBL
SUBLINGUAL_TABLET | SUBLINGUAL | Status: AC
  Administered 2016-07-12: 18:00:00
  Filled 2016-07-12: qty 1

## 2016-07-12 NOTE — Progress Notes (Signed)
[facility] Progress Note  Destiny Hughes 71 y.o. 18-Apr-1945  CC:  Nausea, vomiting, abdominal pain   Subjective: Patient continues to have some nausea but vomiting has resolved. Continues to complain of right sided abdominal pain which is not associated with food. There appears to be some discrepancy in history provided by patient and husband  ROS : Positive for some difficulty breathing, positive for right chest wall pain  negative for GI bleed.   Objective: Vital signs in last 24 hours: Vitals:   07/12/16 0546 07/12/16 0802  BP: (!) 126/55 126/65  Pulse: 62 78  Resp: 18 19  Temp: 98.2 F (36.8 C) 98.1 F (36.7 C)    Physical Exam:  General:  Alert, cooperative, No significant distress.      Eyes:  , EOM's intact,   Lungs:   Clear to auscultation bilaterally, respirations unlabored  Heart:  Regular rate and rhythm, S1, S2 normal  Abdomen:   Soft, Mild right upper quadrant discomfort on palpation. No peritoneal signs. bowel sounds active all four quadrants,  no masses,   Extremities: no  Edema, No clubbing        Lab Results:  Recent Labs  07/11/16 0459 07/12/16 0207  NA 130* 129*  K 3.0* 3.6  CL 102 97*  CO2 22 24  GLUCOSE 101* 119*  BUN 14 8  CREATININE 0.78 0.70  CALCIUM 8.5* 8.6*  MG  --  1.5*   No results for input(s): AST, ALT, ALKPHOS, BILITOT, PROT, ALBUMIN in the last 72 hours.  Recent Labs  07/10/16 0445 07/11/16 0459  WBC 9.6 10.2  HGB 13.7 13.2  HCT 38.9 37.8  MCV 81.9 82.4  PLT 267 275   No results for input(s): LABPROT, INR in the last 72 hours.    Assessment/Plan: - Nausea and vomiting. Improving patient does not have any further vomiting  - Epigastric/right upper quadrant pain. Negative CT abdomen for any acute issues. Normal white count. Pain could be from her recent fall. - Episode of SVT yesterday. - COPD - Chronic pain syndrome  Recommendations -------------------------- - Patient continues to decline  endoscopic evaluation because of concern of adverse effects from anesthesia.  We will get upper GI series for further evaluation of ongoing nausea. We will get hepatic function panel for tomorrow. May consider gastric emptying study if ongoing symptoms. She may need further cardiology workup for SVT. Discussed with the nursing staff. GI will follow   Otis Brace MD, [place] 07/12/2016, 9:55 AM  Pager [phone]  If no answer or after 5 PM call [phone]

## 2016-07-12 NOTE — Progress Notes (Signed)
Taught patient about deep breathing and coughing. Patient provided teach back.

## 2016-07-12 NOTE — Progress Notes (Signed)
Called to see pt re: elevated HR>>IV BB>>SR but BP low>>IVF helped BP but pt having chest pain.  Pt pain is mid-chest, has been having this quite a bit, can last hours. She also has chronic L abd pain, this is different. Pain is worse with deep inspiration and chest wall possibly a little tender. Few rales bases, upper airway congestion noted. Pt not volume-overloaded on exam.   See Rapid Response note for other info, pt doing better after interventions.  Dg Chest Port 1 View Result Date: 07/12/2016 CLINICAL DATA:  Chest pain EXAM: PORTABLE CHEST 1 VIEW COMPARISON:  07/10/2016 chest radiograph. FINDINGS: Surgical hardware from ACD[facility] is partially visualized overlying the lower cervical spine. Spinal stimulator lead overlies the upper thoracic canal. Stable cardiomediastinal silhouette with normal heart size. No pneumothorax. No pleural effusion. Stable chronic blunting of the right costophrenic angle compatible with mild pleural-parenchymal scarring. No pulmonary edema. No acute consolidative airspace disease. IMPRESSION: No active cardiopulmonary disease. Electronically Signed   By: Ilona Sorrel M.D.   On: 07/12/2016 19:01    Lenoard Aden 07/12/2016 8:30 PM Beeper [phone]

## 2016-07-12 NOTE — Progress Notes (Signed)
Stopped in  for f/u visit from prior Rapid response call at 1800.  Pt states she feels terrible and can feel "my heart beating fast again"  Apical HR 182   125/71 24   Pale and diaphoretic.   Central tele confirmed pt to be back in SVT  Lopressor 5 mg IV given per prn order.  Pt placed back on O2 at 2l [place] with O2 sats 99%  and encourage her to keep O2 in place.Pt converted to NSR rate 82 just prior to 12 lead EKG was being taken.  Pt c/o midsternal CP 8/10 while in SVT.  Once back in SR CP decreased to 2/10. Hand off to [place], RN to continue to monitor Cardiac rhythm.Treat CP as necessary.  Will continue to follow as needed.

## 2016-07-12 NOTE — Progress Notes (Signed)
[facility] notified that pt's HR 190s sustaining. 1750 - reassessed pt, symptomatic with 8/10 CP; notified RRT.  Notified Destiny Hughes, cardiology.  Destiny Hughes placed orders for Metoprolol IV 2.5 - 5mg  q2h PRN. Placed pt on O2 2L, performed EKG with results of SVT 190s. 1751 - gave #1 nitro SL.  Gave Metoprolol 5mg  IV.  1755 -  CP still 6/10.  Gave #2 nitro SL. Ordered  DG Chest port view STAT. 1809 - CP 4/10.  Gave #3 nitro SL.  Pt now converted back to 1st AVB. 1810 - Destiny Hughes arrives to assess pt.  Pt now asymptomatic.  Will await results from DG chest port view.  Pt in no acute distress.  Will report pt events to PM nurse.

## 2016-07-12 NOTE — Progress Notes (Signed)
Progress Note  Patient Name: Destiny Hughes Date of Encounter: 07/12/2016  Primary Cardiologist: Dr. Oval Linsey.   Subjective   Several episodes of short runs of SVT last evening.  No events since 10:30 pm.  She says that she feels light headed when this happens.  Her report is clouded by short term memory issues.   Inpatient Medications    Scheduled Meds: . aspirin EC  81 mg Oral Daily  . diclofenac sodium  2 g Topical QID  . enoxaparin (LOVENOX) injection  40 mg Subcutaneous Q24H  . levothyroxine  150 mcg Oral QAC breakfast  . metoprolol tartrate  12.5 mg Oral BID  . multivitamin with minerals  1 tablet Oral Daily  . pantoprazole  40 mg Oral BID  . sodium chloride flush  3 mL Intravenous Q12H  . sucralfate  1 g Oral TID WC & HS  . thiamine  100 mg Oral Daily   Continuous Infusions:  PRN Meds: acetaminophen **OR** acetaminophen, ondansetron **OR** ondansetron (ZOFRAN) IV, traMADol   Vital Signs    Vitals:   07/11/16 1139 07/11/16 1550 07/11/16 1950 07/12/16 0546  BP: 132/74 (!) 119/97 125/60 (!) 126/55  Pulse: 65 (!) 185 82 62  Resp: 18 18 18 18   Temp: 98.1 F (36.7 C)  98.7 F (37.1 C) 98.2 F (36.8 C)  TempSrc: Oral  Oral Oral  SpO2: 98% 98% 95% 95%  Weight:    135 lb 6.4 oz (61.4 kg)  Height:        Intake/Output Summary (Last 24 hours) at 07/12/16 0804 Last data filed at 07/12/16 0747  Gross per 24 hour  Intake             1052 ml  Output             1050 ml  Net                2 ml   Filed Weights   07/10/16 0636 07/11/16 0521 07/12/16 0546  Weight: 134 lb 14.4 oz (61.2 kg) 137 lb 3.2 oz (62.2 kg) 135 lb 6.4 oz (61.4 kg)    Telemetry    Several runs of SVT.  Otherwise NSR. - Personally Reviewed  ECG    NA - Personally Reviewed  Physical Exam   GEN: No acute distress.  Somewhat frail appearing Neck: No  JVD Cardiac: RRR, no murmurs, rubs, or gallops.  Respiratory:   Decreased breath sounds with few basilar crackles. GI: Soft,  nontender, non-distended  MS: No  edema; No deformity. Neuro:  Nonfocal but decreased memory. She did not remember me or our conversation from yesterday.   Psych: Normal affect   Labs    Chemistry Recent Labs Lab 07/10/16 0445 07/11/16 0459 07/12/16 0207  NA 132* 130* 129*  K 3.7 3.0* 3.6  CL 104 102 97*  CO2 21* 22 24  GLUCOSE 96 101* 119*  BUN 19 14 8   CREATININE 0.96 0.78 0.70  CALCIUM 8.4* 8.5* 8.6*  GFRNONAA 58* >60 >60  GFRAA >60 >60 >60  ANIONGAP 7 6 8      Hematology Recent Labs Lab 07/09/16 0612 07/10/16 0445 07/11/16 0459  WBC 13.1* 9.6 10.2  RBC 5.50* 4.75 4.59  HGB 16.3* 13.7 13.2  HCT 45.0 38.9 37.8  MCV 81.8 81.9 82.4  MCH 29.6 28.8 28.8  MCHC 36.2* 35.2 34.9  RDW 13.7 14.2 14.5  PLT 277 267 275    Cardiac EnzymesNo results for input(s): TROPONINI in the last  168 hours. No results for input(s): TROPIPOC in the last 168 hours.   BNPNo results for input(s): BNP, PROBNP in the last 168 hours.   DDimer No results for input(s): DDIMER in the last 168 hours.   Radiology    Ct Chest Wo Contrast  Result Date: 07/10/2016 CLINICAL DATA:  Heterogeneous opacities in the right mid lung zone on a portable chest earlier today. Confusion. Chronic pain syndrome. COPD. Smoker. EXAM: CT CHEST WITHOUT CONTRAST TECHNIQUE: Multidetector CT imaging of the chest was performed following the standard protocol without IV contrast. COMPARISON:  Portable chest obtained earlier today. Chest CT dated 11/18/2014. FINDINGS: Cardiovascular: Atheromatous arterial calcifications, including the aorta and coronary arteries. Normal sized heart. Mediastinum/Nodes: No enlarged mediastinal or axillary lymph nodes. Thyroid gland, trachea, and esophagus demonstrate no significant findings. Lungs/Pleura: Diffuse peribronchial thickening. Minimal bullous change. Pleural and parenchymal scarring at the right lateral lung base without significant change. No lung nodules, airspace consolidation or  pleural fluid. Upper Abdomen: Cholecystectomy clips. Musculoskeletal: Thoracic spine degenerative changes. Cervical spine fixation hardware. Neural stimulator leads in the upper thoracic spinal canal. IMPRESSION: 1. Chronic pleural and parenchymal scarring at the right lateral lung base with no acute lung abnormality seen. 2. Stable mild changes of COPD and chronic bronchitis. 3. Coronary artery atherosclerosis and aortic atherosclerosis. Electronically Signed   By: Claudie Revering M.D.   On: 07/10/2016 16:51    Cardiac Studies   NA  Patient Profile     71 y.o. female with a history of tobacco use, hypertension, mitral valve prolapse, COPD, hyperlipidemia, pSVT, and hypothyroidism; who is being seen in consultation at the request of Dr. Lovena Neighbours for narrow complex rhythm with rapid ventricular rate.  Assessment & Plan    SVT:  Events again last night.  She was treated with IV metoprolol.  We had started her on low dose PO beta blocker aware that Dr. Oval Linsey has recently been reducing this secondary to bradycardia.   I will ask EP to see to consider ablation.    Signed, Minus Breeding, MD  07/12/2016, 8:04 AM

## 2016-07-12 NOTE — Progress Notes (Signed)
Patient is active with [place] as prior to admission for [facility] / PT; Destiny Hughes [phone]

## 2016-07-12 NOTE — Consult Note (Signed)
ELECTROPHYSIOLOGY CONSULT NOTE    Patient ID: Destiny Hughes MRN: [id number], DOB/AGE: 73-06-1945 73 y.o.  Admit date: 07/08/2016 Date of Consult: 07/12/2016   Primary Physician: Minette Brine Primary Cardiologist: Dr. Oval Linsey  Reason for Consultation: SVT  HPI: Destiny Hughes is a 71 y.o. female who is being seen today for the evaluation of SVT and bradycardia at the request of H[place].  PMHx includes HTN, COPD/smoking, HLD, MVP, hypothyroidism, CBP with an implanted nerve stimulator, depression.  In review, it seems c/o palpitations go back to her childhood and as an adult was maintained on Digoxin and propanolol with SB and 1st degree AVblock being described by Dr. Mare Ferrari her prior cardiologist. I see PAT mention in her problem list, cardiology consult note mentions consulted for AFib/SVT.  I note she was hospitalized recently, discharged 07/03/16 for weakness/AMS this was felt to be multifactorial with dehydration, AKI,  though was found bradycardic and her digoxin was stopped and propanolol made to PRN for palpitations only.  Cardiology mentions SB 50's and concerns about possible dig toxicity. (SOB also seems to be a chronic symptom)  This admission she comes with intractable N/V, abdominal pain, anorexia and an un-intential weight loss of 20 pounds in 2 months functional decline and a mechanical fall (no syncope as reported).  She was found with acute encephalopathy, hyponatremic, again AKI, notable is that her methadone and oxycodone were discontinued 2/2 confusion by her pain management MD though N/V onset was reported prior to this and stopped in preparation for her neurological evaluation out patient.  GI is on the case, patient has declined EGD (fearful of sedation).  The patient/family report that all of her symptoms started in March after a cataract surgery and suspect what ever she got for that caused her issues.  (Timing also seems to correlate with the death of her  sister)  Wilburn Mylar she developed an episode of SVT treated with PO BB, she had more SVT last evening and was given a dose of IV BB.  The patient is aware of her tachycardia, can feel palpitations, but no associated symptoms with them, no CP or change in her baseline SOB, no near syncope or syncope.  Her husband says she has been having the same kind of palpitations for many years, though only 2-3x per year typically they just start/stop on their own.  LABS: Na+ 129 (up from 121 on admission) K+ 3.0 > 3.6 BUN/Creat 8/0.70 (down from 45/1.85) WBC 10.2 (down from 16) H/H 13/37 plts 275 Mag 1.5  Trop I 0.06, 0.07 x2, 0.03  07/01/16 TSH 1.2390   Past Medical History:  Diagnosis Date  . Arthritis 01-25-11   back and most joints  . Blood transfusion    after Thoracic surgery ?'04  . Chronic back pain   . Chronic neck pain   . Collapse of right lung   . COPD (chronic obstructive pulmonary disease) (HCC)    Hx. Bronchitis occ, 01-25-11 somes issue now taking  Z-pak-started 01-24-11/Scar tissue  present  from previous lung collapse  . Depression   . Dyspnea   . Dysrhythmia    hx.PAT- tx. Inderal  . Emphysema of lung ([place])   . GERD (gastroesophageal reflux disease)    tx. TUMS  . Glaucoma   . Headache(784.0) 01-25-11   neck and nerve pain related.  . Hyperlipemia   . Hypothyroidism    tx. Levothyroxine  . Nephrolithiasis   . Neuromuscular disorder ([place]) 01-25-11   Pain/nerve stimulator implanted -2  yrs ago.  . Palpitation   . Tachycardia      Surgical History:  Past Surgical History:  Procedure Laterality Date  . APPENDECTOMY  01-25-11  . CARPAL TUNNEL RELEASE  01-25-11   [place]il.  . CERVICAL SPINE SURGERY  01-25-11   2005-multiple levels  . CHOLECYSTECTOMY  01-25-11   '07  . COLONOSCOPY WITH PROPOFOL N/A 07/23/2012   Procedure: COLONOSCOPY WITH PROPOFOL;  Surgeon: Garlan Fair, MD;  Location: [facility] END[facility]COPY;  Service: Endoscopy;  Laterality: N/A;  . EYE SURGERY    . JOINT  REPLACEMENT  01-25-11   6'03 LTHA-hemi  . KNEE ARTHROPLASTY  01-25-11   '04-right, revised x2  . SPINAL CORD STIMULATOR IMPLANT  2009 APPROX   DR. ELSNER  . THORACIC SPINE SURGERY  01-25-11   6'02- then nerve stimulator implanted after  . TOTAL KNEE REVISION  02/01/2011   Procedure: TOTAL KNEE REVISION;  Surgeon: Mauri Pole;  Location: [facility] ORS;  Service: Orthopedics;  Laterality: Right;  . TUBAL LIGATION       Prescriptions Prior to Admission  Medication Sig Dispense Refill Last Dose  . acetaminophen (TYLENOL) 325 MG tablet Take 325-650 mg by mouth every 6 (six) hours as needed for headache (pain).   07/08/2016 at am  . levothyroxine (SYNTHROID, LEVOTHROID) 150 MCG tablet TAKE 1 TO 1+1/2 TABLETS DAILY AS DIRECTED (Patient taking differently: Take 1 1/2 tablet (225 mcg) by mouth on Tuesday, Thursday before breakfast, take 1 tablet (150 mcg) before breakfast on Sunday, Monday, Wednesday, Friday, Saturday) 135 tablet 1 07/08/2016 at am  . Multiple Vitamin (MULTIVITAMIN WITH MINERALS) TABS tablet Take 1 tablet by mouth daily. Women's Alive Gummies   07/07/2016 at am  . promethazine (PHENERGAN) 25 MG tablet Take 25 mg by mouth every 6 (six) hours as needed for nausea or vomiting.   07/07/2016 at pm  . propranolol (INDERAL) 20 MG tablet Take 1 tablet (20 mg total) by mouth 2 (two) times daily as needed (palpitations). (Patient taking differently: Take 20 mg by mouth daily. ) 30 tablet 0 07/08/2016 at 800  . sertraline (ZOLOFT) 100 MG tablet Take 100 mg by mouth at bedtime.   07/07/2016 at pm    Inpatient Medications:  . aspirin EC  81 mg Oral Daily  . diclofenac sodium  2 g Topical QID  . enoxaparin (LOVENOX) injection  40 mg Subcutaneous Q24H  . levothyroxine  150 mcg Oral QAC breakfast  . metoprolol tartrate  12.5 mg Oral BID  . multivitamin with minerals  1 tablet Oral Daily  . pantoprazole  40 mg Oral BID  . sodium chloride flush  3 mL Intravenous Q12H  . sucralfate  1 g Oral TID WC & HS  .  thiamine  100 mg Oral Daily    Allergies:  Allergies  Allergen Reactions  . Albuterol Other (See Comments)    Had difficulty breathing after a nebulizer treatment  . Penicillins Anaphylaxis and Swelling    Has patient had a PCN reaction causing immediate rash, facial/tongue/throat swelling, SOB or lightheadedness with hypotension: Yes Has patient had a PCN reaction causing severe rash involving mucus membranes or skin necrosis: Rash Has patient had a PCN reaction that required hospitalization: No Has patient had a PCN reaction occurring within the last 10 years: No If all of the above answers are "NO", then may proceed with Cephalosporin use.  [name] [Kdc:Albuterol] Shortness Of Breath  . Sulfa Antibiotics Swelling    Throat swells, but no shortness of  breath noted  . Zanaflex [Tizanidine] Other (See Comments)    Possible confusion (??)  . Codeine Nausea And Vomiting  . Ecotrin [Aspirin] Nausea And Vomiting    Social History   Social History  . Marital status: Married    Spouse name: N/A  . Number of children: 2  . Years of education: N/A   Occupational History  . disabled    Social History Main Topics  . Smoking status: Current Every Day Smoker    Packs/day: 0.75    Years: 50.00    Types: Cigarettes    Start date: 09/13/1961  . Smokeless tobacco: Never Used     Comment: Peak rate of 1ppd  . Alcohol use No     Comment: none in 10 years  . Drug use: No  . Sexual activity: No   Other Topics Concern  . Not on file   Social History Narrative   She is from [facility]. She has always lived in [place]. She has traveled to [place], [place], [place], [place], & [place]. 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 Prior exposure to parakeets with last exposure remote in her current home. Previously had mold in her home that was in her bathroom & fixed. Prior exposure to hot tubs but none  recently. Pt lives at home with [name], husband.  Has 2 childrean, 12th grade education.       Family History  Problem Relation Age of Onset  . Heart attack Father   . Emphysema Father   . Aneurysm Mother     CEREBRAL  . Emphysema Mother   . Dementia Mother   . Diabetes Son   . Hypertension Son   . Hypertension Son   . Cancer Sister     Widely Metastatic  . Asthma Son     x2  . Breast cancer Paternal Aunt   . Rheum arthritis Maternal Aunt      Review of Systems: All other systems reviewed and are otherwise negative except as noted above.  Physical Exam: Vitals:   07/11/16 1950 07/12/16 0546 07/12/16 0802 07/12/16 1150  BP: 125/60 (!) 126/55 126/65 138/73  Pulse: 82 62 78 69  Resp: 18 18 19 18   Temp: 98.7 F (37.1 C) 98.2 F (36.8 C) 98.1 F (36.7 C) 98.6 F (37 C)  TempSrc: Oral Oral Oral Oral  SpO2: 95% 95% 96% 98%  Weight:  135 lb 6.4 oz (61.4 kg)    Height:        GEN- The patient is thin, ill appearing, alert and oriented x 3 today, defers to her husband for some historical answers of events in theas couple weeks  HEENT: normocephalic, atraumatic; sclera clear, conjunctiva pink; hearing intact; oropharynx clear; neck supple, no JVP Lymph- no cervical lymphadenopathy Lungs- CTA b/l, soft scattered rhonchi b/l, no wheezing Heart- RRR, no murmurs, rubs or gallops, PMI not laterally displaced GI- soft, non-tender, non-distended Extremities- no clubbing, cyanosis, or edema MS- no significant deformity or atrophy Skin- warm and dry, no rash or lesion Psych- euthymic mood, full affect Neuro- no gross deficits observed  Labs:   Lab Results  Component Value Date   WBC 10.2 07/11/2016   HGB 13.2 07/11/2016   HCT 37.8 07/11/2016   MCV 82.4 07/11/2016   PLT 275 07/11/2016    Recent Labs Lab 07/12/16 0207  NA 129*  K 3.6  CL 97*  CO2 24  BUN 8  CREATININE 0.70  CALCIUM 8.6*  GLUCOSE 119*      Radiology/Studies:  Ct Abdomen Pelvis Wo  Contrast Result Date: 07/01/2016 CLINICAL DATA:  Diffuse pain all over. Nausea. Weight loss. Difficulty eating. EXAM: CT ABDOMEN AND PELVIS WITHOUT CONTRAST TECHNIQUE: Multidetector CT imaging of the abdomen and pelvis was performed following the standard protocol without IV contrast. COMPARISON:  CT of the abdomen and pelvis 06/02/2016 FINDINGS: Lower chest: The lung bases are mildly degraded by patient motion. No focal nodule, mass, or airspace disease is present. Hepatobiliary: No focal liver abnormality is seen. Status post cholecystectomy. No biliary dilatation. Pancreas: Unremarkable. No pancreatic ductal dilatation or surrounding inflammatory changes. Spleen: Normal in size without focal abnormality. Adrenals/Urinary Tract: Adrenal glands are normal bilaterally. Kidneys and ureters are within normal limits. There is no stone or hydronephrosis. The urinary bladder is within normal limits. Stomach/Bowel: The stomach and duodenum are within normal limits. Small bowel is unremarkable. Appendix is visualized and normal. The ascending and transverse colon are within normal limits. The descending and sigmoid colon are unremarkable. Vascular/Lymphatic: Aortic atherosclerosis. No enlarged abdominal or pelvic lymph nodes. Reproductive: Uterus and bilateral adnexa are unremarkable. Other: No abdominal wall hernia or abnormality. No abdominopelvic ascites. Musculoskeletal: Multilevel facet degenerative changes are present in the lumbar spine. These are most pronounced at L2-3 and L3-4 with slight anterolisthesis is present. Vertebral body heights alignment contained. There quickly blastic lesions. The bony pelvis is intact. Left total hip arthroplasty is noted. IMPRESSION: 1. No acute or focal lesion to explain the patient's diffuse abdominal pain. 2. Cholecystectomy. 3. Atherosclerosis without aneurysm. 4. Mild degenerative changes of the lumbar spine are likely within normal limits for age. Electronically Signed   By:  San Morelle M.D.   On: 07/01/2016 15:33   Dg Ribs Unilateral W/chest Right Result Date: 07/08/2016 CLINICAL DATA:  Fall EXAM: RIGHT RIBS AND CHEST - 3+ VIEW COMPARISON:  07/01/2016 FINDINGS: Cardiomediastinal silhouette is unremarkable. No infiltrate or pulmonary edema. No right rib fracture is identified. No pneumothorax. Postcholecystectomy surgical clips are noted. Spinal stimulator wires are noted upper thoracic spine IMPRESSION: No infiltrate or pulmonary edema. No right rib fracture is identified. No pneumothorax. Electronically Signed   By: Lahoma Crocker M.D.   On: 07/08/2016 13:19   Ct Head Wo Contrast Result Date: 07/08/2016 CLINICAL DATA:  Head trauma secondary to a fall last night. EXAM: CT HEAD WITHOUT CONTRAST TECHNIQUE: Contiguous axial images were obtained from the base of the skull through the vertex without intravenous contrast. COMPARISON:  07/01/2016 FINDINGS: Brain: No evidence of acute infarction, hemorrhage, hydrocephalus, extra-axial collection or mass lesion/mass effect. Vascular: No hyperdense vessel or unexpected calcification. Skull: Normal. Negative for fracture or focal lesion. Sinuses/Orbits: Normal. Other: None IMPRESSION: Stable and negative noncontrast CT appearance of the brain. Electronically Signed   By: Lorriane Shire M.D.   On: 07/08/2016 15:46    Ct Chest Wo Contrast Result Date: 07/10/2016 CLINICAL DATA:  Heterogeneous opacities in the right mid lung zone on a portable chest earlier today. Confusion. Chronic pain syndrome. COPD. Smoker. EXAM: CT CHEST WITHOUT CONTRAST TECHNIQUE: Multidetector CT imaging of the chest was performed following the standard protocol without IV contrast. COMPARISON:  Portable chest obtained earlier today. Chest CT dated 11/18/2014. FINDINGS: Cardiovascular: Atheromatous arterial calcifications, including the aorta and coronary arteries. Normal sized heart. Mediastinum/Nodes: No enlarged mediastinal or axillary lymph nodes. Thyroid  gland, trachea, and esophagus demonstrate no significant findings. Lungs/Pleura: Diffuse peribronchial  thickening. Minimal bullous change. Pleural and parenchymal scarring at the right lateral lung base without significant change. No lung nodules, airspace consolidation or pleural fluid. Upper Abdomen: Cholecystectomy clips. Musculoskeletal: Thoracic spine degenerative changes. Cervical spine fixation hardware. Neural stimulator leads in the upper thoracic spinal canal. IMPRESSION: 1. Chronic pleural and parenchymal scarring at the right lateral lung base with no acute lung abnormality seen. 2. Stable mild changes of COPD and chronic bronchitis. 3. Coronary artery atherosclerosis and aortic atherosclerosis. Electronically Signed   By: Claudie Revering M.D.   On: 07/10/2016 16:51   Dg Chest Port 1 View Result Date: 07/10/2016 CLINICAL DATA:  Patient shortness of breath. EXAM: PORTABLE CHEST 1 VIEW COMPARISON:  Chest radiograph 07/08/2016 FINDINGS: Anterior cervical spinal fusion hardware. Spinal stimulator device projects over the upper thoracic spine. Monitoring leads overlie the patient. Stable cardiac and mediastinal contours. Heterogeneous opacities right mid lung. No pleural effusion or pneumothorax. IMPRESSION: Heterogeneous opacities right mid lung which may represent infection in the appropriate clinical setting. Followup PA and lateral chest X-ray is recommended in 3-4 weeks following trial of antibiotic therapy to ensure resolution and exclude underlying malignancy. Electronically Signed   By: Lovey Newcomer M.D.   On: 07/10/2016 07:38      Reviewed by myself;  EKG:  SR 75bpm, 1st degree AVbock, PR 274ms, QRS 69ms, QTc 467ms, diffuse ST/T changes (chronically) EKG's yesterday appear unchanged. TELEMETRY: SR, generally 70's, no brady rate are noted, episodes of SVT 170's 3-6 minutes in duration  09/01/15: TTE Study Conclusions - Left ventricle: The cavity size was normal. Wall thickness was    normal. Systolic function was normal. The estimated ejection   fraction was in the range of 60% to 65%. Wall motion was normal;   there were no regional wall motion abnormalities. Doppler   parameters are consistent with pseudonormal left ventricular   relaxation (grade 2 diastolic dysfunction). The E/e&' ratio is   >15, suggesting elevated LV filling pressure. - Mitral valve: Mildly thickened leaflets . There was mild   regurgitation. - Left atrium: The atrium was normal in size. - Right ventricle: The cavity size was mildly dilated. Systolic   function is reduced. - Right atrium: The atrium was mildly dilated. - Inferior vena cava: The vessel was dilated. The respirophasic   diameter changes were blunted (< 50%), consistent with elevated   central venous pressure. Impressions: - LVEF 60-65%, normal wall thickness, normal wall motion, stage 2   DD with elevated LV filling pressure, mild MR, normal LA size,   mild RAE, dilated RV with reduced systolic function, dilated IVC,   no TR.   Assessment and Plan:   1. PSVT     Agree with restarting betablocker     No bradycardia has been observed here as of yet     EKGs from last week rates were 60/63bpm, and notes that I see remark of HR 50's      Patient has long history of palpitations that she and her husband report are the same she has felt here, though not so frequent, likely provoked more so with acute medical issues currently.  Would not pursue ablation at this time with ongoing acute abdominal pain, and GI issues, electrolyte imbalances, though improving.  Would titrate her BB to HR and BP tolerance for now.  Continue care with medicine team, GI, and primary cardiology.   Venetia Night, PA-C 07/12/2016 12:01 PM  I have seen and examined this patient with Tommye Standard.  Agree  with above, note added to reflect my findings.  On exam, RRR, no murmurs, lungs clear.  Had SVT during hospitalization for fall and GI complaints. Was  recently started on metoprolol but has had issues with bradycardia in the past. Patient is mildly symptomatic with palpitations. At this point, agree with metoprolol. She has not been bradycardic here. She may benefit from ablation in the future which was discussed with her family. Ashyr Hedgepath see her back in clinic to discuss further. In the meantime, if SVT recurs, can titrate beta blockers.   Mimi Debellis M. Adamarys Shall MD 07/12/2016 2:27 PM

## 2016-07-12 NOTE — Consult Note (Addendum)
   [facility] Inpatient Consult   07/12/2016  Darci Lykins [place] 15-Dec-1945 [id number]    Patient evaluated for long-term disease management services with [place] [facility] program. Martin Majestic to bedside to discuss and offer [place] Management services. Patient and husband are  agreeable and written consent signed.   Explained to patient that Mrs. Gantt  will receive post hospital transition of care calls and will be evaluated for monthly home visits. Confirmed Primary Care MD as Dr. Laurance Flatten.  Patient's husband- Cyani Kallstrom states he should be called for post discharge calls at [phone] since Mrs. Korzeniewski has had some memory issues.  Mr. Puskas (husband) states he has some confusion about patient's medications. He states he needs some help in figuring the medications out as they still have discontinued medications on the table at home. Agreeable to [facility] Pharmacist referral. Discussed [place] for follow up on COPD disease and symptom management and transition of care.  Mr. Deringer states he is appreciative of any assistance Mrs. Schlachter can receive.  Made inpatient RNCM aware that [place] [facility] will follow post discharge.  Will make referral for [facility] RNCM and [facility] Pharmacist.  Marthenia Rolling, [place], RN,BSN [facility] Liaison [phone]

## 2016-07-12 NOTE — Significant Event (Signed)
Rapid Response Event Note  Overview: Time Called: 1753 Arrival Time: 1800 Event Type: Respiratory, Cardiac  Initial Focused Assessment:  Called by Gabriel Cirri, RN for pt SVT rate 180 with c/o CP and SOB.  Given Lopressor 5 mg IV at 1751, 250cc NS bolus , and NTG .4 SL X 3  per MD order prior to my arrival.  Upon my arrival pt sitting up in bed, diaphoretic, oriented x 4 , follows commands.  Pt c/o 8/10 Left chest pain, constant, non radiating, onset upon ambulation to BR around 1730 hrs.  RA sats 88%  108/57  98.8 22 Bilateral BS diminished throughout with EW in upper lobes Bilaterally. Poor cough effort with rattling mostly nonproductive cough. Sputum scant and clear. At [place] pt noted to be in NSR rate 84  With left  CP decreased to 4/10 and SOB improved per pt.  Rhonda Barrett to bedside at 1820.  No new orders received.  Interventions:  O2 2l [place] with sats improved to 100% Stat PCXR: No active Cardiopulmonary disease Repeat EKG to document return to NSR rate 84 Insentive spirometer instructions with teach back.Pt achieved 566ml. EW cleared with cough and IS. Oral suctioning, and cough and deep breathing exercises. 1815:  112/73  84 22 97%  [place] 2l   Plan of Care (if not transferred):   Hand off report to [place], RN Encourage IS, cough and deep breath and use of oral suctioning. Monitor HR and VS.  Maintain 02sats >92%.  Will follow as needed   Event Summary: Name of Physician Notified: Rosaria Ferries, [place] at 1754    at    Outcome: Stayed in room and stabalized at [place]

## 2016-07-12 NOTE — Progress Notes (Signed)
[facility]  Patient Status: Active (receiving services up to time of hospitalization)   [facility] is providing the following services: RN and PT  If patient discharges after hours, please call [phone].   Destiny Hughes 07/12/2016, 10:30 AM

## 2016-07-12 NOTE — Care Management Important Message (Signed)
Important Message  Patient Details  Name: Destiny Hughes MRN: [id number] Date of Birth: 1945/11/16   Medicare Important Message Given:  Yes    [name] Montine Circle 07/12/2016, 3:03 PM

## 2016-07-12 NOTE — Progress Notes (Addendum)
Dr. Alessandra Bevels, GI, states that pt is scheduled for UGI with [facility], however, after speaking with Rana, X-Ray Dept, pt must be NPO.  Since pt has already eaten breakfast, this test must be done tomorrow, July 13, 2016.  Rana states that they will try to do test in AM as soon as they can.

## 2016-07-12 NOTE — Progress Notes (Signed)
PROGRESS NOTE        PATIENT DETAILS Name: Destiny Hughes Age: 71 y.o. Sex: female Date of Birth: 1945-06-23 Admit Date: 07/08/2016 Admitting Physician Evalee Mutton Kristeen Mans, MD [facility]:LEXNTZ Laurance Flatten  Brief Narrative: Patient is a 71 y.o. female with history of COPD, tobacco use, chronic pain syndrome (off narcotics for the past 3 weeks) recently admitted from 4/13-4/15 for evaluation of bradycardia, acute kidney injury-readmitted on 4/20 for evaluation of fall,, weakness, epigastric pain, and ongoing intermittent confusion. Found to have hyponatremia, acute renal failure and subsequently admitted to the hospitalist service. See below for further details  Subjective: Continues to have right-sided chest pain.  No nausea or vomiting.  Telemetry: Short bursts of SVT-multiple times yesterday until last evening. No major weakness this morning. (Telemetry personally reviewed)  Assessment/Plan: Hyponatremia: Significantly improved-continues to have mild hyponatremia. Etiology thought to be secondary to dehydration/poor oral intake. Continue supportive measures and monitor electrolytes periodically.  Acute kidney injury: Resolved, likely hemodynamically mediated acute kidney injury in the setting of GI loss. Continue to monitor electrolytes periodically.   Acute metabolic encephalopathy: Seems to improve everyday, is awake and alert. Spouse at bedside also acknowledges clinical improvement. CT head negative for acute abnormalities. HIV, RPR negative. TSH and vitamin B 12 levels within normal limits. Apparently, for the past few months, patient is at short term memory loss. Patient has follow-up with neurology in the outpatient setting-will defer further workup-although patient seems to be much more improved compared to her initial presentation to the emergency room. I suspect, some amount of bereavement may be playing a role here-patient's sister passed away in June 11, 2022.  Ordered MRI-but apparently patient has a spinal cord stimulator in place-husband does not want to pursue MRI of the brain at this time.  Fever:? Etiology-1 episode on 4/20-none since then. All cultures negative to date. Continue to monitor off antimicrobial therapy  Nausea with vomiting: Resolved with supportive measures-no pathology seen on CT scan of the abdomen on 4/14. Given recurrent nature, GI was consulted-evaluation currently in progress, apparently patient and family are hesitant to proceed with endoscopy-they're worried about sedation and its effect on her mentation, per family she had similar effects post cataract surgery. Will await further GI input, continue with as needed antiemetics.   Epigastric pain/upper abdominal pain: Improved with PPI and Carafate-apparently ongoing for the past month or so. Per family she is approximately lost 20 pounds. GI evaluation in progress, see above regarding EGD. Note CT chest and abdomen negative for malignancy.   Fall: Suspect mechanical fall-occurred on 4/20 prior to hospital admission. Patient denies syncopal episode. Appreciate PT evaluation, plans are for home health on discharge.  Polycythemia: Likely from COPD/tobacco use. Hemoglobin much better following hydration. Erythropoietin level borderline low, JAK2 mutation still pending.  Short burst of SVT: Continues to have episodes of transient SVT till yesterday evening, on metoprolol. Urology consulted on 4/23, EP evaluation pending. Note, during her most recent admission earlier this month-she was bradycardic  History of bradycardia: Telemetry stable-no bradycardic or episodes-tolerating beta blocker well. As noted above, she was admitted earlier this month, digoxin and propranolol were discontinued due to beta blocker. TSH normal .  Hypothyroidism: Continue levothyroxine  History of COPD: Currently stable-follow  Approximately 20 pound weight loss: Her family has around 20 pound weight loss  in the past few months-TSH within normal limits. See above  regarding endoscopic evaluation.    History of chronic pain syndrome: Methadone/oxycodone discontinued approximately 3 weeks prior to this admission. Continues to have intermittent right-sided rib pain-on topical NSAIDs and as needed Ultram.  DVT Prophylaxis: Prophylactic Lovenox   Code Status: Full code   Family Communication: Spouse at bedside  Disposition Plan: Remain inpatient-home when work up complete-but will plan on Home health on discharge  Antimicrobial agents: Anti-infectives    None      Procedures: None  CONSULTS:  None  Time spent: 25- minutes-Greater than 50% of this time was spent in counseling, explanation of diagnosis, planning of further management, and coordination of care.  MEDICATIONS: Scheduled Meds: . aspirin EC  81 mg Oral Daily  . diclofenac sodium  2 g Topical QID  . enoxaparin (LOVENOX) injection  40 mg Subcutaneous Q24H  . levothyroxine  150 mcg Oral QAC breakfast  . metoprolol tartrate  12.5 mg Oral BID  . multivitamin with minerals  1 tablet Oral Daily  . pantoprazole  40 mg Oral BID  . sodium chloride flush  3 mL Intravenous Q12H  . sucralfate  1 g Oral TID WC & HS  . thiamine  100 mg Oral Daily   Continuous Infusions:  PRN Meds:.acetaminophen **OR** acetaminophen, ondansetron **OR** ondansetron (ZOFRAN) IV, traMADol   PHYSICAL EXAM: Vital signs: Vitals:   07/11/16 1950 07/12/16 0546 07/12/16 0802 07/12/16 1150  BP: 125/60 (!) 126/55 126/65 138/73  Pulse: 82 62 78 69  Resp: 18 18 19 18   Temp: 98.7 F (37.1 C) 98.2 F (36.8 C) 98.1 F (36.7 C) 98.6 F (37 C)  TempSrc: Oral Oral Oral Oral  SpO2: 95% 95% 96% 98%  Weight:  61.4 kg (135 lb 6.4 oz)    Height:       Filed Weights   07/10/16 0636 07/11/16 0521 07/12/16 0546  Weight: 61.2 kg (134 lb 14.4 oz) 62.2 kg (137 lb 3.2 oz) 61.4 kg (135 lb 6.4 oz)   Body mass index is 21.85 kg/m.   General appearance  :Awake, alert, not in any distress.  Eyes:, pupils equally reactive to light and accomodation,no scleral icterus. HEENT: Atraumatic and Normocephalic Neck: supple, no JVD. Resp:Good air entry bilaterally,No rales or rhonchi CVS: S1 S2 regular, no murmurs.  GI: Bowel sounds present, Non tender and not distended with no gaurding, rigidity or rebound. Extremities: B/L Lower Ext shows no edema, both legs are warm to touch Neurology:  speech clear,Non focal, sensation is grossly intact. Psychiatric: Normal judgment and insight. Normal mood. Musculoskeletal:No digital cyanosis Skin:No Rash, warm and dry Wounds:N/A  I have personally reviewed following labs and imaging studies  LABORATORY DATA: CBC:  Recent Labs Lab 07/08/16 1228 07/08/16 1736 07/08/16 1909 07/09/16 0612 07/10/16 0445 07/11/16 0459  WBC 14.9* 16.0* 14.4* 13.1* 9.6 10.2  NEUTROABS 11.0*  --   --   --   --   --   HGB 20.0* 18.5* 18.7* 16.3* 13.7 13.2  HCT 53.6* 50.1* 50.4* 45.0 38.9 37.8  MCV 80.7 81.1 80.9 81.8 81.9 82.4  PLT 333 301 302 277 267 993    Basic Metabolic Panel:  Recent Labs Lab 07/08/16 1736 07/08/16 1909 07/09/16 0612 07/10/16 0445 07/11/16 0459 07/12/16 0207  NA 123*  --  123* 132* 130* 129*  K 4.1  --  4.2 3.7 3.0* 3.6  CL 94*  --  96* 104 102 97*  CO2 17*  --  16* 21* 22 24  GLUCOSE 115*  --  102* 96  101* 119*  BUN 31*  --  28* 19 14 8   CREATININE 1.55* 1.55* 1.31* 0.96 0.78 0.70  CALCIUM 9.3  --  9.0 8.4* 8.5* 8.6*  MG  --   --   --   --   --  1.5*    GFR: Estimated Creatinine Clearance: 59.5 mL/min (by C-G formula based on SCr of 0.7 mg/dL).  Liver Function Tests: No results for input(s): AST, ALT, ALKPHOS, BILITOT, PROT, ALBUMIN in the last 168 hours. No results for input(s): LIPASE, AMYLASE in the last 168 hours. No results for input(s): AMMONIA in the last 168 hours.  Coagulation Profile: No results for input(s): INR, PROTIME in the last 168 hours.  Cardiac  Enzymes: No results for input(s): CKTOTAL, CKMB, CKMBINDEX, TROPONINI in the last 168 hours.  BNP (last 3 results) No results for input(s): PROBNP in the last 8760 hours.  HbA1C: No results for input(s): HGBA1C in the last 72 hours.  CBG: No results for input(s): GLUCAP in the last 168 hours.  Lipid Profile: No results for input(s): CHOL, HDL, LDLCALC, TRIG, CHOLHDL, LDLDIRECT in the last 72 hours.  Thyroid Function Tests: No results for input(s): TSH, T4TOTAL, FREET4, T3FREE, THYROIDAB in the last 72 hours.  Anemia Panel: No results for input(s): VITAMINB12, FOLATE, FERRITIN, TIBC, IRON, RETICCTPCT in the last 72 hours.  Urine analysis:    Component Value Date/Time   COLORURINE YELLOW 07/09/2016 1156   APPEARANCEUR HAZY (A) 07/09/2016 1156   LABSPEC 1.015 07/09/2016 1156   PHURINE 6.0 07/09/2016 1156   GLUCOSEU NEGATIVE 07/09/2016 1156   HGBUR NEGATIVE 07/09/2016 1156   BILIRUBINUR NEGATIVE 07/09/2016 1156   KETONESUR 5 (A) 07/09/2016 1156   PROTEINUR NEGATIVE 07/09/2016 1156   UROBILINOGEN 0.2 02/17/2014 1026   NITRITE NEGATIVE 07/09/2016 1156   LEUKOCYTESUR SMALL (A) 07/09/2016 1156    Sepsis Labs: Lactic Acid, Venous    Component Value Date/Time   LATICACIDVEN 0.76 07/01/2016 1757    MICROBIOLOGY: Recent Results (from the past 240 hour(s))  Culture, blood (routine x 2)     Status: None (Preliminary result)   Collection Time: 07/09/16  6:16 AM  Result Value Ref Range Status   Specimen Description BLOOD LEFT ARM  Final   Special Requests IN PEDIATRIC BOTTLE Blood Culture adequate volume  Final   Culture NO GROWTH 3 DAYS  Final   Report Status PENDING  Incomplete  Culture, blood (routine x 2)     Status: None (Preliminary result)   Collection Time: 07/09/16  6:21 AM  Result Value Ref Range Status   Specimen Description BLOOD LEFT ARM  Final   Special Requests IN PEDIATRIC BOTTLE Blood Culture adequate volume  Final   Culture NO GROWTH 3 DAYS  Final    Report Status PENDING  Incomplete  Culture, Urine     Status: Abnormal   Collection Time: 07/09/16  3:29 PM  Result Value Ref Range Status   Specimen Description URINE, RANDOM  Final   Special Requests NONE  Final   Culture <10,000 COLONIES/mL INSIGNIFICANT GROWTH (A)  Final   Report Status 07/10/2016 FINAL  Final  Culture, Urine     Status: Abnormal   Collection Time: 07/11/16  3:57 AM  Result Value Ref Range Status   Specimen Description URINE, CLEAN CATCH  Final   Special Requests NONE  Final   Culture MULTIPLE SPECIES PRESENT, SUGGEST RECOLLECTION (A)  Final   Report Status 07/12/2016 FINAL  Final    RADIOLOGY STUDIES/RESULTS: Ct Abdomen Pelvis  Wo Contrast  Result Date: 07/01/2016 CLINICAL DATA:  Diffuse pain all over. Nausea. Weight loss. Difficulty eating. EXAM: CT ABDOMEN AND PELVIS WITHOUT CONTRAST TECHNIQUE: Multidetector CT imaging of the abdomen and pelvis was performed following the standard protocol without IV contrast. COMPARISON:  CT of the abdomen and pelvis 06/02/2016 FINDINGS: Lower chest: The lung bases are mildly degraded by patient motion. No focal nodule, mass, or airspace disease is present. Hepatobiliary: No focal liver abnormality is seen. Status post cholecystectomy. No biliary dilatation. Pancreas: Unremarkable. No pancreatic ductal dilatation or surrounding inflammatory changes. Spleen: Normal in size without focal abnormality. Adrenals/Urinary Tract: Adrenal glands are normal bilaterally. Kidneys and ureters are within normal limits. There is no stone or hydronephrosis. The urinary bladder is within normal limits. Stomach/Bowel: The stomach and duodenum are within normal limits. Small bowel is unremarkable. Appendix is visualized and normal. The ascending and transverse colon are within normal limits. The descending and sigmoid colon are unremarkable. Vascular/Lymphatic: Aortic atherosclerosis. No enlarged abdominal or pelvic lymph nodes. Reproductive: Uterus and  bilateral adnexa are unremarkable. Other: No abdominal wall hernia or abnormality. No abdominopelvic ascites. Musculoskeletal: Multilevel facet degenerative changes are present in the lumbar spine. These are most pronounced at L2-3 and L3-4 with slight anterolisthesis is present. Vertebral body heights alignment contained. There quickly blastic lesions. The bony pelvis is intact. Left total hip arthroplasty is noted. IMPRESSION: 1. No acute or focal lesion to explain the patient's diffuse abdominal pain. 2. Cholecystectomy. 3. Atherosclerosis without aneurysm. 4. Mild degenerative changes of the lumbar spine are likely within normal limits for age. Electronically Signed   By: San Morelle M.D.   On: 07/01/2016 15:33   Dg Ribs Unilateral W/chest Right  Result Date: 07/08/2016 CLINICAL DATA:  Fall EXAM: RIGHT RIBS AND CHEST - 3+ VIEW COMPARISON:  07/01/2016 FINDINGS: Cardiomediastinal silhouette is unremarkable. No infiltrate or pulmonary edema. No right rib fracture is identified. No pneumothorax. Postcholecystectomy surgical clips are noted. Spinal stimulator wires are noted upper thoracic spine IMPRESSION: No infiltrate or pulmonary edema. No right rib fracture is identified. No pneumothorax. Electronically Signed   By: Lahoma Crocker M.D.   On: 07/08/2016 13:19   Ct Head Wo Contrast  Result Date: 07/08/2016 CLINICAL DATA:  Head trauma secondary to a fall last night. EXAM: CT HEAD WITHOUT CONTRAST TECHNIQUE: Contiguous axial images were obtained from the base of the skull through the vertex without intravenous contrast. COMPARISON:  07/01/2016 FINDINGS: Brain: No evidence of acute infarction, hemorrhage, hydrocephalus, extra-axial collection or mass lesion/mass effect. Vascular: No hyperdense vessel or unexpected calcification. Skull: Normal. Negative for fracture or focal lesion. Sinuses/Orbits: Normal. Other: None IMPRESSION: Stable and negative noncontrast CT appearance of the brain. Electronically  Signed   By: Lorriane Shire M.D.   On: 07/08/2016 15:46   Ct Head Wo Contrast  Result Date: 07/01/2016 CLINICAL DATA:  71 year old female with confusion and hypotension. EXAM: CT HEAD WITHOUT CONTRAST TECHNIQUE: Contiguous axial images were obtained from the base of the skull through the vertex without intravenous contrast. COMPARISON:  Head CT without contrast 06/02/2016 and earlier FINDINGS: Brain: Stable cerebral volume. No midline shift, ventriculomegaly, mass effect, evidence of mass lesion, intracranial hemorrhage or evidence of cortically based acute infarction. Gray-white matter differentiation is within normal limits throughout the brain. Vascular: Calcified atherosclerosis at the skull base. No suspicious intracranial vascular hyperdensity. Skull: Stable and negative. No acute osseous abnormality identified. Sinuses/Orbits: Visualized paranasal sinuses and mastoids are stable and well pneumatized. Other: Stable and negative orbit and scalp  soft tissues. IMPRESSION: Stable and negative for age noncontrast CT appearance of the brain. Electronically Signed   By: Genevie Ann M.D.   On: 07/01/2016 17:43   Ct Chest Wo Contrast  Result Date: 07/10/2016 CLINICAL DATA:  Heterogeneous opacities in the right mid lung zone on a portable chest earlier today. Confusion. Chronic pain syndrome. COPD. Smoker. EXAM: CT CHEST WITHOUT CONTRAST TECHNIQUE: Multidetector CT imaging of the chest was performed following the standard protocol without IV contrast. COMPARISON:  Portable chest obtained earlier today. Chest CT dated 11/18/2014. FINDINGS: Cardiovascular: Atheromatous arterial calcifications, including the aorta and coronary arteries. Normal sized heart. Mediastinum/Nodes: No enlarged mediastinal or axillary lymph nodes. Thyroid gland, trachea, and esophagus demonstrate no significant findings. Lungs/Pleura: Diffuse peribronchial thickening. Minimal bullous change. Pleural and parenchymal scarring at the right  lateral lung base without significant change. No lung nodules, airspace consolidation or pleural fluid. Upper Abdomen: Cholecystectomy clips. Musculoskeletal: Thoracic spine degenerative changes. Cervical spine fixation hardware. Neural stimulator leads in the upper thoracic spinal canal. IMPRESSION: 1. Chronic pleural and parenchymal scarring at the right lateral lung base with no acute lung abnormality seen. 2. Stable mild changes of COPD and chronic bronchitis. 3. Coronary artery atherosclerosis and aortic atherosclerosis. Electronically Signed   By: Claudie Revering M.D.   On: 07/10/2016 16:51   Dg Chest Port 1 View  Result Date: 07/10/2016 CLINICAL DATA:  Patient shortness of breath. EXAM: PORTABLE CHEST 1 VIEW COMPARISON:  Chest radiograph 07/08/2016 FINDINGS: Anterior cervical spinal fusion hardware. Spinal stimulator device projects over the upper thoracic spine. Monitoring leads overlie the patient. Stable cardiac and mediastinal contours. Heterogeneous opacities right mid lung. No pleural effusion or pneumothorax. IMPRESSION: Heterogeneous opacities right mid lung which may represent infection in the appropriate clinical setting. Followup PA and lateral chest X-ray is recommended in 3-4 weeks following trial of antibiotic therapy to ensure resolution and exclude underlying malignancy. Electronically Signed   By: Lovey Newcomer M.D.   On: 07/10/2016 07:38   Portable Chest 1 View  Result Date: 07/01/2016 CLINICAL DATA:  Encephalopathy EXAM: PORTABLE CHEST 1 VIEW COMPARISON:  Portable exam 1620 hours compared to 10/13/2015 FINDINGS: Intraspinal stimulator projects over the upper thoracic spine at T3-T5. Prior cervical fusion. Normal heart size, mediastinal contours, and pulmonary vascularity. Lungs emphysematous but clear. No pulmonary infiltrate, pleural effusion or pneumothorax. Bones demineralized. IMPRESSION: Probable COPD changes without acute infiltrate. Electronically Signed   By: Lavonia Dana M.D.    On: 07/01/2016 16:28     LOS: 4 days   Oren Binet, MD  [facility] Pager:[phone]  If 7PM-7AM, please contact night-coverage [email or web address] Password [email or web address] 07/12/2016, 1:22 PM

## 2016-07-13 ENCOUNTER — Other Ambulatory Visit: Payer: Self-pay | Admitting: Cardiology

## 2016-07-13 ENCOUNTER — Encounter (HOSPITAL_COMMUNITY)
Admission: EM | Disposition: A | Discharge status: Home / Self Care | Payer: Self-pay | Source: Home / Self Care | Attending: Internal Medicine

## 2016-07-13 ENCOUNTER — Inpatient Hospital Stay (HOSPITAL_COMMUNITY): Payer: Medicare Other

## 2016-07-13 DIAGNOSIS — I471 Supraventricular tachycardia: Secondary | ICD-10-CM

## 2016-07-13 LAB — BASIC METABOLIC PANEL
Anion gap: 7 (ref 5–15)
BUN: 7 (ref 4–21)
BUN: 7 mg/dL (ref 6–20)
CHLORIDE: 94 mmol/L — AB (ref 101–111)
CO2: 28 mmol/L (ref 22–32)
Calcium: 9 mg/dL (ref 8.9–10.3)
Creatinine, Ser: 0.68 mg/dL (ref 0.44–1.00)
Creatinine: 0.7 (ref <=1.1)
GFR calc Af Amer: >60 mL/min (ref >60)
GFR calc non Af Amer: >60 mL/min (ref >60)
Glucose, Bld: 111 mg/dL — ABNORMAL HIGH (ref 65–99)
Glucose: 111
POTASSIUM: 3.9 mmol/L (ref 3.5–5.1)
Potassium: 3.9 (ref 3.4–5.3)
Sodium: 129 mmol/L — ABNORMAL LOW (ref 135–145)
Sodium: 129 — AB (ref 137–147)

## 2016-07-13 LAB — HEPATIC FUNCTION PANEL
ALK PHOS: 53 (ref 25–125)
ALT: 21 U/L (ref 14–54)
ALT: 21 (ref 7–35)
AST: 19 U/L (ref 15–41)
AST: 19 (ref 13–35)
Albumin: 3.1 g/dL — ABNORMAL LOW (ref 3.5–5.0)
Alkaline Phosphatase: 53 U/L (ref 38–126)
Bilirubin, Direct: <0.1 mg/dL — ABNORMAL LOW (ref 0.1–0.5)
Total Bilirubin: 0.6 mg/dL (ref 0.3–1.2)
Total Protein: 5.7 g/dL — ABNORMAL LOW (ref 6.5–8.1)

## 2016-07-13 SURGERY — ESOPHAGOGASTRODUODENOSCOPY (EGD) WITH PROPOFOL
Anesthesia: Monitor Anesthesia Care

## 2016-07-13 MED ORDER — METOPROLOL TARTRATE 25 MG PO TABS
25.0000 mg | ORAL_TABLET | Freq: Three times a day (TID) | ORAL | Status: DC
  Administered 2016-07-13 – 2016-07-14 (×3): 25 mg via ORAL
  Filled 2016-07-13 (×3): qty 1

## 2016-07-13 MED ORDER — MAGNESIUM SULFATE 2 GM/50ML IV SOLN
2.0000 g | Freq: Once | INTRAVENOUS | Status: AC
  Administered 2016-07-13: 2 g via INTRAVENOUS
  Filled 2016-07-13: qty 50

## 2016-07-13 MED ORDER — METOPROLOL TARTRATE 25 MG PO TABS: 25.0000 mg | ORAL_TABLET | Freq: Two times a day (BID) | ORAL | Status: DC

## 2016-07-13 NOTE — Progress Notes (Signed)
Progress Note  Patient Name: Destiny Hughes Date of Encounter: 07/13/2016  Primary Cardiologist: Dr. Oval Linsey.   Subjective   Several episodes of short runs of SVT again yesterday.  She is slightly confused this morning after taking a pain med.  She denies any acute pain or SOB  Inpatient Medications    Scheduled Meds: . aspirin EC  81 mg Oral Daily  . diclofenac sodium  2 g Topical QID  . enoxaparin (LOVENOX) injection  40 mg Subcutaneous Q24H  . levothyroxine  150 mcg Oral QAC breakfast  . metoprolol tartrate  25 mg Oral BID  . multivitamin with minerals  1 tablet Oral Daily  . pantoprazole  40 mg Oral BID  . sodium chloride flush  3 mL Intravenous Q12H  . sucralfate  1 g Oral TID WC & HS  . thiamine  100 mg Oral Daily   Continuous Infusions:  PRN Meds: acetaminophen **OR** acetaminophen, metoprolol, nitroGLYCERIN, ondansetron **OR** ondansetron (ZOFRAN) IV, traMADol   Vital Signs    Vitals:   07/12/16 1815 07/12/16 2120 07/13/16 0523 07/13/16 1100  BP: 112/73 135/67 (!) 147/80 138/74  Pulse: 84 81 71 82  Resp: (!) 22 20 20    Temp:  98.6 F (37 C) 98.2 F (36.8 C)   TempSrc:  Oral Oral   SpO2: 100% 93% 95%   Weight:      Height:        Intake/Output Summary (Last 24 hours) at 07/13/16 1219 Last data filed at 07/13/16 1128  Gross per 24 hour  Intake              850 ml  Output             1750 ml  Net             -900 ml   Filed Weights   07/10/16 0636 07/11/16 0521 07/12/16 0546  Weight: 134 lb 14.4 oz (61.2 kg) 137 lb 3.2 oz (62.2 kg) 135 lb 6.4 oz (61.4 kg)    Telemetry    Several runs of SVT.  Otherwise NSR. - Personally Reviewed  ECG    NA - Personally Reviewed  Physical Exam   GEN: No acute distress.  Somewhat confused today.  Neck: No  JVD Cardiac: RRR, no murmurs, rubs, or gallops.  Exam unchanged.  Respiratory:   Clear GI: Soft, nontender, non-distended  MS: No  edema; No deformity. Neuro:  Nonfocal.   Psych: Normal affect     Labs    Chemistry  Recent Labs Lab 07/11/16 0459 07/12/16 0207 07/13/16 0448  NA 130* 129* 129*  K 3.0* 3.6 3.9  CL 102 97* 94*  CO2 22 24 28   GLUCOSE 101* 119* 111*  BUN 14 8 7   CREATININE 0.78 0.70 0.68  CALCIUM 8.5* 8.6* 9.0  PROT  --   --  5.7*  ALBUMIN  --   --  3.1*  AST  --   --  19  ALT  --   --  21  ALKPHOS  --   --  53  BILITOT  --   --  0.6  GFRNONAA >60 >60 >60  GFRAA >60 >60 >60  ANIONGAP 6 8 7      Hematology  Recent Labs Lab 07/09/16 0612 07/10/16 0445 07/11/16 0459  WBC 13.1* 9.6 10.2  RBC 5.50* 4.75 4.59  HGB 16.3* 13.7 13.2  HCT 45.0 38.9 37.8  MCV 81.8 81.9 82.4  MCH 29.6 28.8 28.8  MCHC  36.2* 35.2 34.9  RDW 13.7 14.2 14.5  PLT 277 267 275    Cardiac EnzymesNo results for input(s): TROPONINI in the last 168 hours. No results for input(s): TROPIPOC in the last 168 hours.   BNPNo results for input(s): BNP, PROBNP in the last 168 hours.   DDimer No results for input(s): DDIMER in the last 168 hours.   Radiology    Dg Chest Port 1 View  Result Date: 07/12/2016 CLINICAL DATA:  Chest pain EXAM: PORTABLE CHEST 1 VIEW COMPARISON:  07/10/2016 chest radiograph. FINDINGS: Surgical hardware from [facility] is partially visualized overlying the lower cervical spine. Spinal stimulator lead overlies the upper thoracic canal. Stable cardiomediastinal silhouette with normal heart size. No pneumothorax. No pleural effusion. Stable chronic blunting of the right costophrenic angle compatible with mild pleural-parenchymal scarring. No pulmonary edema. No acute consolidative airspace disease. IMPRESSION: No active cardiopulmonary disease. Electronically Signed   By: Ilona Sorrel M.D.   On: 07/12/2016 19:01   Dg Ugi  W/kub  Result Date: 07/13/2016 CLINICAL DATA:  Nausea.  Epigastric pain.  Food intolerance. EXAM: UPPER GI SERIES WITH KUB TECHNIQUE: After obtaining a scout radiograph a routine upper GI series was performed using thin barium FLUOROSCOPY TIME:   Fluoroscopy Time:  2 minutes and 18 seconds Number of Acquired Spot Images: 1 COMPARISON:  Esophagram of 11/17/2014.  CT of 07/01/2016. FINDINGS: Exam is moderately degraded secondary to patient immobility and clinical status. Exam was performed with patient supine and in a minimally LPO oblique position. Preprocedure scout image demonstrates cholecystectomy clips. Mild convex left lumbar spine curvature. Non-obstructive bowel gas pattern. Evaluation of esophageal peristalsis demonstrates an incomplete primary peristaltic wave with mild proximal escape. Full column evaluation of the esophagus demonstrates no persistent narrowing or stricture. Normal single-contrast appearance of the stomach, without mass or ulcer identified. Prompt passage of contrast into the duodenal bulb and C-loop, which are normal in appearance. Normal proximal small bowel caliber. IMPRESSION: 1. Limited, single-contrast exam as described above. 2. Normal appearance of the stomach and duodenum, without explanation for patient's symptoms. 3. Mild esophageal dysmotility, likely presbyesophagus. Electronically Signed   By: Abigail Miyamoto M.D.   On: 07/13/2016 08:27    Cardiac Studies   NA  Patient Profile     71 y.o. female with a history of tobacco use, hypertension, mitral valve prolapse, COPD, hyperlipidemia, pSVT, and hypothyroidism; who is being seen in consultation at the request of Dr. Lovena Neighbours for narrow complex rhythm with rapid ventricular rate.  Assessment & Plan    SVT:  Events again last night.  EP saw her and will follow as an out patient.  No ablation at this time.  I will increase to metoprolol 25 mg tid.  She will need an out patient event monitor and then follow up with Dr. Oval Linsey.  Signed, Minus Breeding, MD  07/13/2016, 12:19 PM

## 2016-07-13 NOTE — Progress Notes (Signed)
PROGRESS NOTE        PATIENT DETAILS Name: Destiny Hughes Age: 71 y.o. Sex: female Date of Birth: [birth year] Admit Date: 07/08/2016 Admitting Physician Evalee Mutton Kristeen Mans, MD EQA:[id number] Laurance Flatten  Brief Narrative: Patient is a 71 y.o. female with history of COPD, tobacco use, chronic pain syndrome (off narcotics for the past 3 weeks) recently admitted from 4/13-4/15 for evaluation of bradycardia, acute kidney injury-readmitted on 4/20 for evaluation of fall,, weakness, epigastric pain, and ongoing intermittent confusion. Found to have hyponatremia, acute renal failure and subsequently admitted to the hospitalist service. See below for further details  Subjective: No nausea or vomiting last night  Right sided chest pain seems controlled with Ultram  Spouse now wants to pursue EGD-had refused it earlier-he seems to be confused between EGD and upper GI series.   Telemetry (personally reviewed): Multiple episodes of SVT last night.   Assessment/Plan: Hyponatremia: Improved-although mild hyponatremia persists. Etiology thought to be secondary to dehydration/poor oral intake. Continue to follow electrolytes periodically.   Acute kidney injury: Resolved, history of present illness secondary to hemodynamically mediated kidney injury in a setting of vomiting. Follow electrolytes periodically.  SVT: Had multiple episodes of SVT-last night-she was apparently symptomatic. On metoprolol, cardiology and EP following.  Acute metabolic encephalopathy: Resolved, is awake and alert. Etiology felt to be secondary to electrolyte abnormalities on admission. Appears to have short-term memory loss (?dementia) for the past few months. See discussion below.   Nausea with vomiting: Resolved-no obvious etiology apparent on CT scan of the abdomen. GI consulted for endoscopic evaluation-however family was hesitant to proceed sedation as they felt that some of her encephalopathy issues  occurred after recent cataract surgery where she was given sedation. GI following, upper GI series without any acute abnormalities. Spouse indicates that the family will consent to EGD-await GI follow-up today.  Epigastric pain/upper abdominal pain: Resolved, given reported history of 20 pound weight loss-GI consulted for endoscopic evaluation. Await GI input. See above.  History of bradycardia: Telemetry stable-no bradycardic or episodes-tolerating beta blocker well. As noted above, she was admitted earlier this month, digoxin and propranolol were discontinued due to beta blocker. TSH normal .  Fever:? Etiology-1 episode on 4/20-none since then. All cultures negative to date. Continue to monitor off antimicrobial therapy  Hypothyroidism: Continue levothyroxine  History of COPD: Currently stable-follow  Approximately 20 pound weight loss: Her family has around 20 pound weight loss in the past few months-TSH within normal limits. See above regarding endoscopic evaluation.    History of chronic pain syndrome: Methadone/oxycodone discontinued approximately 3 weeks prior to this admission. Continues to have intermittent right-sided rib pain-on topical NSAIDs and as needed Ultram.  ?Dementia//bereavement: Family reports confusion for the past few months-claims that this got worse after recent cataract surgery. Outpatient neurology evaluation in progress-but patient was hospitalized 2 for a vomiting etc. She is clinically significantly improved than her initial presentation. CT head negative for acute abnormalities. HIV, RPR negative. TSH and vitamin B 12 levels within normal limits. Ordered MRI-but apparently patient has a spinal cord stimulator in place-husband does not want to pursue MRI of the brain at this time. Patient has follow-up with neurology- defer further workup to the outpatient setting  Fall: Suspect mechanical fall-occurred on 4/20 prior to hospital admission. Patient denies syncopal  episode. Appreciate PT evaluation, plans are for home health on discharge.  Polycythemia: Likely from COPD/tobacco use. Hemoglobin much better following hydration. Erythropoietin level borderline low, JAK2 mutation still pending.  Hypothyroidism: Continue levothyroxine  History of COPD: Currently stable-follow  Approximately 20 pound weight loss: Her family has around 20 pound weight loss in the past few months-TSH within normal limits. See above regarding endoscopic evaluation.    History of chronic pain syndrome: Methadone/oxycodone discontinued approximately 3 weeks prior to this admission. Right-sided chest pain seems to have improved with topical NSAID and as needed Ultram.   DVT Prophylaxis: Prophylactic Lovenox   Code Status: Full code   Family Communication: Spouse at bedside  Disposition Plan: Remain inpatient-home when work up complete-but will plan on Home health on discharge  Antimicrobial agents: Anti-infectives    None      Procedures: None  CONSULTS:  None  Time spent: 25- minutes-Greater than 50% of this time was spent in counseling, explanation of diagnosis, planning of further management, and coordination of care.  MEDICATIONS: Scheduled Meds: . aspirin EC  81 mg Oral Daily  . diclofenac sodium  2 g Topical QID  . enoxaparin (LOVENOX) injection  40 mg Subcutaneous Q24H  . levothyroxine  150 mcg Oral QAC breakfast  . metoprolol tartrate  12.5 mg Oral BID  . multivitamin with minerals  1 tablet Oral Daily  . pantoprazole  40 mg Oral BID  . sodium chloride flush  3 mL Intravenous Q12H  . sucralfate  1 g Oral TID WC & HS  . thiamine  100 mg Oral Daily   Continuous Infusions: . magnesium sulfate 1 - 4 g bolus IVPB     PRN Meds:.acetaminophen **OR** acetaminophen, metoprolol, nitroGLYCERIN, ondansetron **OR** ondansetron (ZOFRAN) IV, traMADol   PHYSICAL EXAM: Vital signs: Vitals:   07/12/16 1800 07/12/16 1815 07/12/16 2120 07/13/16 0523  BP:  (!) 108/57 112/73 135/67 (!) 147/80  Pulse:  84 81 71  Resp: (!) 22 (!) 22 20 20   Temp: 98.8 F (37.1 C)  98.6 F (37 C) 98.2 F (36.8 C)  TempSrc:   Oral Oral  SpO2: (!) 88% 100% 93% 95%  Weight:      Height:       Filed Weights   07/10/16 0636 07/11/16 0521 07/12/16 0546  Weight: 61.2 kg (134 lb 14.4 oz) 62.2 kg (137 lb 3.2 oz) 61.4 kg (135 lb 6.4 oz)   Body mass index is 21.85 kg/m.   General appearance :Awake, alert, not in any distress.  Eyes:, pupils equally reactive to light and accomodation,no scleral icterus. HEENT: Atraumatic and Normocephalic Neck: supple, no JVD. Resp:Good air entry bilaterally, clear to auscultation anteriorly CVS: S1 S2 regular, no murmurs.  GI: Bowel sounds present, Non tender and not distended with no gaurding, rigidity or rebound. Extremities: B/L Lower Ext shows no edema, both legs are warm to touch Neurology:  speech clear,Non focal, sensation is grossly intact. Psychiatric: Normal judgment and insight. Normal mood. Musculoskeletal:No digital cyanosis Skin:No Rash, warm and dry Wounds:N/A  I have personally reviewed following labs and imaging studies  LABORATORY DATA: CBC:  Recent Labs Lab 07/08/16 1228 07/08/16 1736 07/08/16 1909 07/09/16 0612 07/10/16 0445 07/11/16 0459  WBC 14.9* 16.0* 14.4* 13.1* 9.6 10.2  NEUTROABS 11.0*  --   --   --   --   --   HGB 20.0* 18.5* 18.7* 16.3* 13.7 13.2  HCT 53.6* 50.1* 50.4* 45.0 38.9 37.8  MCV 80.7 81.1 80.9 81.8 81.9 82.4  PLT 333 301 302 277 267 300    Basic Metabolic  Panel:  Recent Labs Lab 07/09/16 0612 07/10/16 0445 07/11/16 0459 07/12/16 0207 07/13/16 0448  NA 123* 132* 130* 129* 129*  K 4.2 3.7 3.0* 3.6 3.9  CL 96* 104 102 97* 94*  CO2 16* 21* 22 24 28   GLUCOSE 102* 96 101* 119* 111*  BUN 28* 19 14 8 7   CREATININE 1.31* 0.96 0.78 0.70 0.68  CALCIUM 9.0 8.4* 8.5* 8.6* 9.0  MG  --   --   --  1.5*  --     GFR: Estimated Creatinine Clearance: 59.5 mL/min (by C-G  formula based on SCr of 0.68 mg/dL).  Liver Function Tests:  Recent Labs Lab 07/13/16 0448  AST 19  ALT 21  ALKPHOS 53  BILITOT 0.6  PROT 5.7*  ALBUMIN 3.1*   No results for input(s): LIPASE, AMYLASE in the last 168 hours. No results for input(s): AMMONIA in the last 168 hours.  Coagulation Profile: No results for input(s): INR, PROTIME in the last 168 hours.  Cardiac Enzymes: No results for input(s): CKTOTAL, CKMB, CKMBINDEX, TROPONINI in the last 168 hours.  BNP (last 3 results) No results for input(s): PROBNP in the last 8760 hours.  HbA1C: No results for input(s): HGBA1C in the last 72 hours.  CBG: No results for input(s): GLUCAP in the last 168 hours.  Lipid Profile: No results for input(s): CHOL, HDL, LDLCALC, TRIG, CHOLHDL, LDLDIRECT in the last 72 hours.  Thyroid Function Tests: No results for input(s): TSH, T4TOTAL, FREET4, T3FREE, THYROIDAB in the last 72 hours.  Anemia Panel: No results for input(s): VITAMINB12, FOLATE, FERRITIN, TIBC, IRON, RETICCTPCT in the last 72 hours.  Urine analysis:    Component Value Date/Time   COLORURINE YELLOW 07/09/2016 1156   APPEARANCEUR HAZY (A) 07/09/2016 1156   LABSPEC 1.015 07/09/2016 1156   PHURINE 6.0 07/09/2016 1156   GLUCOSEU NEGATIVE 07/09/2016 1156   HGBUR NEGATIVE 07/09/2016 1156   BILIRUBINUR NEGATIVE 07/09/2016 1156   KETONESUR 5 (A) 07/09/2016 1156   PROTEINUR NEGATIVE 07/09/2016 1156   UROBILINOGEN 0.2 02/17/2014 1026   NITRITE NEGATIVE 07/09/2016 1156   LEUKOCYTESUR SMALL (A) 07/09/2016 1156    Sepsis Labs: Lactic Acid, Venous    Component Value Date/Time   LATICACIDVEN 0.76 07/01/2016 1757    MICROBIOLOGY: Recent Results (from the past 240 hour(s))  Culture, blood (routine x 2)     Status: None (Preliminary result)   Collection Time: 07/09/16  6:16 AM  Result Value Ref Range Status   Specimen Description BLOOD LEFT ARM  Final   Special Requests IN PEDIATRIC BOTTLE Blood Culture adequate  volume  Final   Culture NO GROWTH 3 DAYS  Final   Report Status PENDING  Incomplete  Culture, blood (routine x 2)     Status: None (Preliminary result)   Collection Time: 07/09/16  6:21 AM  Result Value Ref Range Status   Specimen Description BLOOD LEFT ARM  Final   Special Requests IN PEDIATRIC BOTTLE Blood Culture adequate volume  Final   Culture NO GROWTH 3 DAYS  Final   Report Status PENDING  Incomplete  Culture, Urine     Status: Abnormal   Collection Time: 07/09/16  3:29 PM  Result Value Ref Range Status   Specimen Description URINE, RANDOM  Final   Special Requests NONE  Final   Culture <10,000 COLONIES/mL INSIGNIFICANT GROWTH (A)  Final   Report Status 07/10/2016 FINAL  Final  Culture, Urine     Status: Abnormal   Collection Time: 07/11/16  3:57 AM  Result Value Ref Range Status   Specimen Description URINE, CLEAN CATCH  Final   Special Requests NONE  Final   Culture MULTIPLE SPECIES PRESENT, SUGGEST RECOLLECTION (A)  Final   Report Status 07/12/2016 FINAL  Final    RADIOLOGY STUDIES/RESULTS: Ct Abdomen Pelvis Wo Contrast  Result Date: 07/01/2016 CLINICAL DATA:  Diffuse pain all over. Nausea. Weight loss. Difficulty eating. EXAM: CT ABDOMEN AND PELVIS WITHOUT CONTRAST TECHNIQUE: Multidetector CT imaging of the abdomen and pelvis was performed following the standard protocol without IV contrast. COMPARISON:  CT of the abdomen and pelvis 06/02/2016 FINDINGS: Lower chest: The lung bases are mildly degraded by patient motion. No focal nodule, mass, or airspace disease is present. Hepatobiliary: No focal liver abnormality is seen. Status post cholecystectomy. No biliary dilatation. Pancreas: Unremarkable. No pancreatic ductal dilatation or surrounding inflammatory changes. Spleen: Normal in size without focal abnormality. Adrenals/Urinary Tract: Adrenal glands are normal bilaterally. Kidneys and ureters are within normal limits. There is no stone or hydronephrosis. The urinary bladder  is within normal limits. Stomach/Bowel: The stomach and duodenum are within normal limits. Small bowel is unremarkable. Appendix is visualized and normal. The ascending and transverse colon are within normal limits. The descending and sigmoid colon are unremarkable. Vascular/Lymphatic: Aortic atherosclerosis. No enlarged abdominal or pelvic lymph nodes. Reproductive: Uterus and bilateral adnexa are unremarkable. Other: No abdominal wall hernia or abnormality. No abdominopelvic ascites. Musculoskeletal: Multilevel facet degenerative changes are present in the lumbar spine. These are most pronounced at L2-3 and L3-4 with slight anterolisthesis is present. Vertebral body heights alignment contained. There quickly blastic lesions. The bony pelvis is intact. Left total hip arthroplasty is noted. IMPRESSION: 1. No acute or focal lesion to explain the patient's diffuse abdominal pain. 2. Cholecystectomy. 3. Atherosclerosis without aneurysm. 4. Mild degenerative changes of the lumbar spine are likely within normal limits for age. Electronically Signed   By: San Morelle M.D.   On: 07/01/2016 15:33   Dg Ribs Unilateral W/chest Right  Result Date: 07/08/2016 CLINICAL DATA:  Fall EXAM: RIGHT RIBS AND CHEST - 3+ VIEW COMPARISON:  07/01/2016 FINDINGS: Cardiomediastinal silhouette is unremarkable. No infiltrate or pulmonary edema. No right rib fracture is identified. No pneumothorax. Postcholecystectomy surgical clips are noted. Spinal stimulator wires are noted upper thoracic spine IMPRESSION: No infiltrate or pulmonary edema. No right rib fracture is identified. No pneumothorax. Electronically Signed   By: Lahoma Crocker M.D.   On: 07/08/2016 13:19   Ct Head Wo Contrast  Result Date: 07/08/2016 CLINICAL DATA:  Head trauma secondary to a fall last night. EXAM: CT HEAD WITHOUT CONTRAST TECHNIQUE: Contiguous axial images were obtained from the base of the skull through the vertex without intravenous contrast.  COMPARISON:  07/01/2016 FINDINGS: Brain: No evidence of acute infarction, hemorrhage, hydrocephalus, extra-axial collection or mass lesion/mass effect. Vascular: No hyperdense vessel or unexpected calcification. Skull: Normal. Negative for fracture or focal lesion. Sinuses/Orbits: Normal. Other: None IMPRESSION: Stable and negative noncontrast CT appearance of the brain. Electronically Signed   By: Lorriane Shire M.D.   On: 07/08/2016 15:46   Ct Head Wo Contrast  Result Date: 07/01/2016 CLINICAL DATA:  71 year old female with confusion and hypotension. EXAM: CT HEAD WITHOUT CONTRAST TECHNIQUE: Contiguous axial images were obtained from the base of the skull through the vertex without intravenous contrast. COMPARISON:  Head CT without contrast 06/02/2016 and earlier FINDINGS: Brain: Stable cerebral volume. No midline shift, ventriculomegaly, mass effect, evidence of mass lesion, intracranial hemorrhage or evidence of cortically based acute infarction. Gray-white  matter differentiation is within normal limits throughout the brain. Vascular: Calcified atherosclerosis at the skull base. No suspicious intracranial vascular hyperdensity. Skull: Stable and negative. No acute osseous abnormality identified. Sinuses/Orbits: Visualized paranasal sinuses and mastoids are stable and well pneumatized. Other: Stable and negative orbit and scalp soft tissues. IMPRESSION: Stable and negative for age noncontrast CT appearance of the brain. Electronically Signed   By: Genevie Ann M.D.   On: 07/01/2016 17:43   Ct Chest Wo Contrast  Result Date: 07/10/2016 CLINICAL DATA:  Heterogeneous opacities in the right mid lung zone on a portable chest earlier today. Confusion. Chronic pain syndrome. COPD. Smoker. EXAM: CT CHEST WITHOUT CONTRAST TECHNIQUE: Multidetector CT imaging of the chest was performed following the standard protocol without IV contrast. COMPARISON:  Portable chest obtained earlier today. Chest CT dated 11/18/2014.  FINDINGS: Cardiovascular: Atheromatous arterial calcifications, including the aorta and coronary arteries. Normal sized heart. Mediastinum/Nodes: No enlarged mediastinal or axillary lymph nodes. Thyroid gland, trachea, and esophagus demonstrate no significant findings. Lungs/Pleura: Diffuse peribronchial thickening. Minimal bullous change. Pleural and parenchymal scarring at the right lateral lung base without significant change. No lung nodules, airspace consolidation or pleural fluid. Upper Abdomen: Cholecystectomy clips. Musculoskeletal: Thoracic spine degenerative changes. Cervical spine fixation hardware. Neural stimulator leads in the upper thoracic spinal canal. IMPRESSION: 1. Chronic pleural and parenchymal scarring at the right lateral lung base with no acute lung abnormality seen. 2. Stable mild changes of COPD and chronic bronchitis. 3. Coronary artery atherosclerosis and aortic atherosclerosis. Electronically Signed   By: Claudie Revering M.D.   On: 07/10/2016 16:51   Dg Chest Port 1 View  Result Date: 07/12/2016 CLINICAL DATA:  Chest pain EXAM: PORTABLE CHEST 1 VIEW COMPARISON:  07/10/2016 chest radiograph. FINDINGS: Surgical hardware from [facility] is partially visualized overlying the lower cervical spine. Spinal stimulator lead overlies the upper thoracic canal. Stable cardiomediastinal silhouette with normal heart size. No pneumothorax. No pleural effusion. Stable chronic blunting of the right costophrenic angle compatible with mild pleural-parenchymal scarring. No pulmonary edema. No acute consolidative airspace disease. IMPRESSION: No active cardiopulmonary disease. Electronically Signed   By: Ilona Sorrel M.D.   On: 07/12/2016 19:01   Dg Chest Port 1 View  Result Date: 07/10/2016 CLINICAL DATA:  Patient shortness of breath. EXAM: PORTABLE CHEST 1 VIEW COMPARISON:  Chest radiograph 07/08/2016 FINDINGS: Anterior cervical spinal fusion hardware. Spinal stimulator device projects over the upper  thoracic spine. Monitoring leads overlie the patient. Stable cardiac and mediastinal contours. Heterogeneous opacities right mid lung. No pleural effusion or pneumothorax. IMPRESSION: Heterogeneous opacities right mid lung which may represent infection in the appropriate clinical setting. Followup PA and lateral chest X-ray is recommended in 3-4 weeks following trial of antibiotic therapy to ensure resolution and exclude underlying malignancy. Electronically Signed   By: Lovey Newcomer M.D.   On: 07/10/2016 07:38   Portable Chest 1 View  Result Date: 07/01/2016 CLINICAL DATA:  Encephalopathy EXAM: PORTABLE CHEST 1 VIEW COMPARISON:  Portable exam 1620 hours compared to 10/13/2015 FINDINGS: Intraspinal stimulator projects over the upper thoracic spine at T3-T5. Prior cervical fusion. Normal heart size, mediastinal contours, and pulmonary vascularity. Lungs emphysematous but clear. No pulmonary infiltrate, pleural effusion or pneumothorax. Bones demineralized. IMPRESSION: Probable COPD changes without acute infiltrate. Electronically Signed   By: Lavonia Dana M.D.   On: 07/01/2016 16:28   Dg Duanne Limerick  W/kub  Result Date: 07/13/2016 CLINICAL DATA:  Nausea.  Epigastric pain.  Food intolerance. EXAM: UPPER GI SERIES WITH KUB TECHNIQUE: After obtaining  a scout radiograph a routine upper GI series was performed using thin barium FLUOROSCOPY TIME:  Fluoroscopy Time:  2 minutes and 18 seconds Number of Acquired Spot Images: 1 COMPARISON:  Esophagram of 11/17/2014.  CT of 07/01/2016. FINDINGS: Exam is moderately degraded secondary to patient immobility and clinical status. Exam was performed with patient supine and in a minimally LPO oblique position. Preprocedure scout image demonstrates cholecystectomy clips. Mild convex left lumbar spine curvature. Non-obstructive bowel gas pattern. Evaluation of esophageal peristalsis demonstrates an incomplete primary peristaltic wave with mild proximal escape. Full column evaluation of  the esophagus demonstrates no persistent narrowing or stricture. Normal single-contrast appearance of the stomach, without mass or ulcer identified. Prompt passage of contrast into the duodenal bulb and C-loop, which are normal in appearance. Normal proximal small bowel caliber. IMPRESSION: 1. Limited, single-contrast exam as described above. 2. Normal appearance of the stomach and duodenum, without explanation for patient's symptoms. 3. Mild esophageal dysmotility, likely presbyesophagus. Electronically Signed   By: Abigail Miyamoto M.D.   On: 07/13/2016 08:27     LOS: 5 days   Oren Binet, MD  [facility] Pager:[phone]  If 7PM-7AM, please contact night-coverage [email or web address] Password [facility] 07/13/2016, 9:35 AM

## 2016-07-13 NOTE — Progress Notes (Signed)
Physical Therapy Treatment Patient Details Name: Destiny Hughes MRN: [id number] DOB: 11-27-1945 Today's Date: 07/13/2016    History of Present Illness Pt is a 71 y/o female admitted from home secondary to sustaining a fall. Pt unable to recall events of this fall and pt's husband reports that he did not witness the fall. Pt found to be hyponatremic and with an AKI. Of note, pt recently admitted and discharged from [facility] on 4/13-4/15 secondary to bradycardia. PMH including but not limited to COPD and chronic pain.    PT Comments    Patient appeared weaker this session and less conversant than previous session. SpO2 between 86-91% on 1.5-2L O2 via [place] and HR between 73-118 during session. Pt much more steady with use of AD for ambulation. Pt continues to maintain cervical flexion and reported pain with extension--likely due to poor positioning in bed. Pt encouraged to get OOB with staff assist throughout the day so she is sitting upright. Husband present.  Continue to progress as tolerated.   Follow Up Recommendations  Home health PT;Supervision/Assistance - 24 hour     Equipment Recommendations  3in1 (PT)    Recommendations for Other Services       Precautions / Restrictions Precautions Precautions: Fall Restrictions Weight Bearing Restrictions: No    Mobility  Bed Mobility Overal bed mobility: Needs Assistance Bed Mobility: Supine to Sit     Supine to sit: HOB elevated;Min guard     General bed mobility comments: use of bed rail and HOB elevated; increased time and effort required; min guard for safety  Transfers Overall transfer level: Needs assistance Equipment used: Rolling walker (2 wheeled) Transfers: Sit to/from Stand Sit to Stand: Min guard         General transfer comment: cues for safe hand placement from EOB and BSC  Ambulation/Gait Ambulation/Gait assistance: Min assist Ambulation Distance (Feet): 60 Feet Assistive device: Rolling walker (2  wheeled) Gait Pattern/deviations: Step-through pattern;Decreased stride length;Trunk flexed Gait velocity: decreased   General Gait Details: multimodal cues for posture and forward gaze; pt maintained cervical flexion throughout session and reported pain with cervical extension; assist to steady with turning and cues for safe use of AD   Stairs            Wheelchair Mobility    Modified Rankin (Stroke Patients Only)       Balance Overall balance assessment: Needs assistance;History of Falls Sitting-balance support: Feet supported Sitting balance-Leahy Scale: Good       Standing balance-Leahy Scale: Poor                              Cognition Arousal/Alertness: Awake/alert Behavior During Therapy: Flat affect Overall Cognitive Status: Impaired/Different from baseline Area of Impairment: Safety/judgement;Memory;Problem solving                     Memory: Decreased short-term memory   Safety/Judgement: Decreased awareness of deficits   Problem Solving: Requires verbal cues;Requires tactile cues;Decreased initiation;Slow processing        Exercises      General Comments General comments (skin integrity, edema, etc.): pt on 1.5L O2 via  upon arrival and SpO2 91% HR  73 at rest; SpO2 86-90% on 2L with ambulation and HR up to 118; pt appeared weaker this session and less alert and conversant; husband present; pt encouraged to get OOB more often throughout the day with staff assist to recliner and to sit upright  for 1-2 hours at a time; husband and pt verbalized understanding      Pertinent Vitals/Pain Pain Assessment: Faces Faces Pain Scale: Hurts a little bit Pain Location: R trunk Pain Descriptors / Indicators: Sore Pain Intervention(s): Limited activity within patient's tolerance;Monitored during session;Premedicated before session;Repositioned    Home Living                      Prior Function            PT Goals (current  goals can now be found in the care plan section) Acute Rehab PT Goals Patient Stated Goal: To go home PT Goal Formulation: With patient/family Time For Goal Achievement: 07/23/16 Potential to Achieve Goals: Good Progress towards PT goals: Progressing toward goals    Frequency    Min 3X/week      PT Plan Current plan remains appropriate    Co-evaluation             End of Session Equipment Utilized During Treatment: Gait belt Activity Tolerance: Patient limited by fatigue Patient left: with call bell/phone within reach;with family/visitor present;in chair Nurse Communication: Mobility status PT Visit Diagnosis: Unsteadiness on feet (R26.81);Other abnormalities of gait and mobility (R26.89);Pain Pain - Right/Left: Right Pain - part of body:  (torso)     Time: 9480-1655 PT Time Calculation (min) (ACUTE ONLY): 27 min  Charges:  $Gait Training: 8-22 mins $Therapeutic Activity: 8-22 mins                    G Codes:       Earney Navy, PTA Pager: [phone]     Darliss Cheney 07/13/2016, 11:48 AM

## 2016-07-13 NOTE — Progress Notes (Signed)
Patient stable. No episodes of SVT since 10:21pm. Patient has maintained HR in the 70's. No complaints of chest pain. Pain med given for ribcage pain.

## 2016-07-13 NOTE — Progress Notes (Signed)
Pt en route to radiology, ccmd notified of pt location change.

## 2016-07-13 NOTE — Progress Notes (Signed)
CCMD notified nurse of patient's rhythm change to SVT. upon arrival to the room, Rapid Response was at bedside. Patient's HR 187. 5mg  of metoprolol given iv. Patient rhythm converted to sinus. Patient placed on O2.. Will continue to monitor patient.

## 2016-07-13 NOTE — Progress Notes (Signed)
[facility] Progress Note  Destiny Hughes 71 y.o. Sep 12, 1945  CC:  Nausea, vomiting, abdominal pain   Subjective:  Patient continues to have some nausea but denied any vomiting. Denied diarrhea. Abdominal pain is now at periumbilical area compared to yesterday which was at epigastric and right upper quadrant. Underwent upper GI series which was unremarkable except for presbyesophagus.  ROS :  positive for right chest wall pain  negative for GI bleed.   Objective: Vital signs in last 24 hours: Vitals:   07/12/16 2120 07/13/16 0523  BP: 135/67 (!) 147/80  Pulse: 81 71  Resp: 20 20  Temp: 98.6 F (37 C) 98.2 F (36.8 C)    Physical Exam:  General:  Alert, cooperative, No significant distress.      Eyes:  , EOM's intact,   Lungs:   Clear to auscultation bilaterally, respirations unlabored  Heart:  Regular rate and rhythm, S1, S2 normal  Abdomen:   Soft, Epigastric and periumbilical tenderness to palpation without any peritoneal signs. bowel sounds active all four quadrants,  no masses,   Extremities: no  Edema, No clubbing        Lab Results:  Recent Labs  07/12/16 0207 07/13/16 0448  NA 129* 129*  K 3.6 3.9  CL 97* 94*  CO2 24 28  GLUCOSE 119* 111*  BUN 8 7  CREATININE 0.70 0.68  CALCIUM 8.6* 9.0  MG 1.5*  --     Recent Labs  07/13/16 0448  AST 19  ALT 21  ALKPHOS 53  BILITOT 0.6  PROT 5.7*  ALBUMIN 3.1*    Recent Labs  07/11/16 0459  WBC 10.2  HGB 13.2  HCT 37.8  MCV 82.4  PLT 275   No results for input(s): LABPROT, INR in the last 72 hours.    Assessment/Plan: - Nausea and vomiting. Improving patient does not have any further vomiting . ?? Gastroparesis/ functional - Epigastric/right upper quadrant pain. Negative CT abdomen for any acute issues. Normal white count.  - Episode of SVT yesterday. - COPD - Chronic pain syndrome - Weight loss  Recommendations -------------------------- - Upper GI series report reviewed.  Normal appearance of the stomach and the duodenum. Tertiary contraction and esophagus noted. Results discussed with patient's husband. Patient's vomiting is resolved. They want to  continue conservative management at this time . They want the endoscopic workup only if symptoms persist. May consider gastric emptying study if ongoing symptoms. She may need further cardiology workup for SVT. Discussed with primary team. Advance diet to GI soft.[facility] will follow   Otis Brace MD, [place] 07/13/2016, 9:57 AM  Pager [phone]  If no answer or after 5 PM call [phone]

## 2016-07-14 ENCOUNTER — Other Ambulatory Visit: Payer: Self-pay | Admitting: Student

## 2016-07-14 DIAGNOSIS — R079 Chest pain, unspecified: Secondary | ICD-10-CM

## 2016-07-14 LAB — CULTURE, BLOOD (ROUTINE X 2)
CULTURE: NO GROWTH
Culture: NO GROWTH
SPECIAL REQUESTS: ADEQUATE
SPECIAL REQUESTS: ADEQUATE

## 2016-07-14 MED ORDER — PANTOPRAZOLE SODIUM 40 MG PO TBEC: 40.0000 mg | DELAYED_RELEASE_TABLET | Freq: Two times a day (BID) | ORAL | 0 refills | Status: DC

## 2016-07-14 MED ORDER — ACETAMINOPHEN 325 MG PO TABS: 650.0000 mg | ORAL_TABLET | Freq: Four times a day (QID) | ORAL | Status: DC | PRN

## 2016-07-14 MED ORDER — METOPROLOL TARTRATE 25 MG PO TABS: 25.0000 mg | ORAL_TABLET | Freq: Three times a day (TID) | ORAL | 0 refills | Status: DC

## 2016-07-14 MED ORDER — DICLOFENAC SODIUM 1 % TD GEL: 2.0000 g | Freq: Four times a day (QID) | TRANSDERMAL | 0 refills | Status: DC | PRN

## 2016-07-14 NOTE — Progress Notes (Signed)
[facility] Progress Note  Destiny Hughes 71 y.o. May 02, 1945  CC:  Nausea, vomiting, abdominal pain   Subjective:  Patient resting comfortably at this time. Husband at bedside. Denied any further nausea and vomiting. Denied diarrhea. Having normal bowel movements. Overall feeling better.  ROS :  positive for right chest wall pain  negative for GI bleed.   Objective: Vital signs in last 24 hours: Vitals:   07/14/16 0944 07/14/16 0946  BP: 138/70 138/70  Pulse: 71 71  Resp:    Temp:      Physical Exam:  General:  Alert, cooperative, No significant distress.      Eyes:  , EOM's intact,   Lungs:   Clear to auscultation bilaterally, respirations unlabored  Heart:  Regular rate and rhythm, S1, S2 normal  Abdomen:   Soft, Nondistended , NT , No  peritoneal signs. bowel sounds active all four quadrants,  no masses,   Extremities: no  Edema, No clubbing        Lab Results:  Recent Labs  07/12/16 0207 07/13/16 0448  NA 129* 129*  K 3.6 3.9  CL 97* 94*  CO2 24 28  GLUCOSE 119* 111*  BUN 8 7  CREATININE 0.70 0.68  CALCIUM 8.6* 9.0  MG 1.5*  --     Recent Labs  07/13/16 0448  AST 19  ALT 21  ALKPHOS 53  BILITOT 0.6  PROT 5.7*  ALBUMIN 3.1*   No results for input(s): WBC, NEUTROABS, HGB, HCT, MCV, PLT in the last 72 hours. No results for input(s): LABPROT, INR in the last 72 hours.    Assessment/Plan: - Nausea and vomiting.Resolved. ?? Gastroparesis/ functional - Epigastric/right upper quadrant pain. Negative CT abdomen for any acute issues. Normal white count. Improving - Episode of SVT yesterday. - COPD - Chronic pain syndrome - Weight loss  Recommendations -------------------------- - Patient is feeling better.  - Patient and family declined endoscopic workup during this hospitalization. They want the endoscopic workup only if symptoms persist. May consider gastric emptying study if ongoing symptoms. She may need further cardiology  workup for SVT.  - No plan for further inpatient GI workup. Okay to discharge from GI standpoint. - Follow-up with primary GI in 3-4 weeks.   Otis Brace MD, [place] 07/14/2016, 10:12 AM  Pager [phone]  If no answer or after 5 PM call [phone]

## 2016-07-14 NOTE — Progress Notes (Signed)
Patient's vital signs stable. Complaints of pain addressed. No concerns at this time. Patient in bed asleep.

## 2016-07-14 NOTE — Progress Notes (Signed)
Call placed to CCMD to notify of telemetry monitoring d/c.   

## 2016-07-14 NOTE — Discharge Summary (Signed)
PATIENT DETAILS Name: Destiny Hughes Age: 71 y.o. Sex: female Date of Birth: [birth year] MRN: [id number]. Admitting Physician: Jonetta Osgood, MD JJH:ERDEYC Birder Robson Date: 07/08/2016 Discharge date: 07/14/2016  Recommendations for Outpatient Follow-up:  1. Follow up with PCP in 1-2 weeks 2. Please obtain BMP/CBC in one week 3. Please ensure follow-up with cardiology, neurology and gastroenterology 4. Consider outpatient referral to counseling or psychiatry-patient appears to have some amount of debridement due to recent loss of her sister. 5. Please follow up on the following pending results:JAK2 levels-if positive will need referral to hematology  Admitted From:  Home   Disposition: Home with home health services   Home Health: Yes  Equipment/Devices: None  Discharge Condition: Stable  CODE STATUS: FULL CODE  Diet recommendation:  Heart Healthy   Brief Summary: See H&P, Labs, Consult and Test reports for all details in brief, Patient is a 71 y.o. female with history of COPD, tobacco use, chronic pain syndrome (off narcotics for the past 3 weeks) recently admitted from 4/13-4/15 for evaluation of bradycardia, acute kidney injury-readmitted on 4/20 for evaluation of fall,, weakness, epigastric pain, and ongoing intermittent confusion. Found to have hyponatremia, acute renal failure and subsequently admitted to the hospitalist service. See below for further details  Brief Hospital Course: Hyponatremia:Much Improved-continues to have hyponatremia persists. Etiology thought to be secondary to dehydration/poor oral intake. Continue to follow electrolytes periodically in the outpatient setting.  Acute kidney injury: Resolved, history of present illness secondary to hemodynamically mediated kidney injury in a setting of vomiting. Follow electrolytes periodically.  SVT: Hospital course complicated by multiple episodes of SVT requiring cardiology consultation and  titration of beta blocker dosing. She was also seen by EP service, who suggested outpatient follow-up for possible ablation consideration. Spoke with cardiology today-Dr. Kyung Bacca suggested that we continue with metoprolol 3 times a day on discharge. Appointment with cardiology clinic made for placement of event monitor. Cardiology will continue to follow the outpatient setting.   Acute metabolic encephalopathy: Resolved, is awake and alert. Etiology felt to be secondary to electrolyte abnormalities on admission. Appears to have short-term memory loss (?dementia) for the past few months and is being evaluated by neurology in the outpatient setting. See discussion below.   Nausea with vomiting: Resolved-no obvious etiology apparent on CT scan of the abdomen. Given long-standing history of narcotic use (although discontinued 3 weeks prior to this hospitalization)-could have gastroparesis. GI consulted for endoscopic evaluation-however family was hesitant to proceed sedation as they felt that some of her encephalopathy issues occurred after recent cataract surgery where she was given sedation. GI followed closely throughout this hospital stay, upper GI series without any acute abnormalities. She is now tolerating regular diet easily. Since GI not recommending endoscopic evaluation, and with no further nausea and vomiting while inpatient-stable for discharge with close outpatient follow-up with gastroenterology. Per GI M.D., if symptoms recur or she is hospitalized again, will likely need endoscopic evaluation.   Epigastric pain/upper abdominal pain: Resolved, given reported history of 20 pound weight loss-GI consulted for endoscopic evaluation. CT scan of the abdomen, upper GI series negative for major acute abnormalities. See above.  History of bradycardia: Telemetry stable-no bradycardic or episodes-tolerating beta blocker well. As noted above, she was admitted earlier this month, digoxin and  propranolol were discontinued due to beta blocker. TSH normal .  Fever:? Etiology-1 episode on 4/20-none since then. All cultures negative to date. Monitored off antimicrobial therapy  Hypothyroidism: Continue levothyroxine  History of COPD: Currently stable-follow  Approximately 20 pound weight loss: Her family has around 20 pound weight loss in the past few months-TSH within normal limits. See above regarding endoscopic evaluation.    History of chronic pain syndrome: Methadone/oxycodone discontinued approximately 3 weeks prior to this admission. Improved-with supportive measures, she is easily able to take deep breaths this morning on my exam which is a more acute change from her early part of her hospital bed. Patient and spouse both acknowledge significant improvement in her right chest area. This is thought to be secondary to the mechanical fall she had just prior to this admission. She was managed with topical NSAIDs and as needed Ultram. Spoke with both spouse and patient at bedside this morning-they do not want narcotics, and are okay with extra strength Tylenol and as needed topical NSAIDs. Have asked them to follow up with their outpatient pain management M.D. for further continued care  ?Dementia//bereavement: Family reports confusion for the past few months-claims that this got worse after recent cataract surgery. Outpatient neurology evaluation in progress-but patient was hospitalized 2 for a vomiting etc. She is clinically significantly improved than her initial presentation. CT head negative for acute abnormalities. HIV, RPR negative. TSH and vitamin B 12 levels within normal limits. Ordered MRI-but apparently patient has a spinal cord stimulator in place-husband does not want to pursue MRI of the brain at this time. Patient has follow-up with neurology- defer further workup to the outpatient setting. Patient continues to have a flat affect-suggest outpatient referral to  counseling/psychiatry if this continues. She is already on Zoloft.  Fall: Suspect mechanical fall-occurred on 4/20 prior to hospital admission. Patient denies syncopal episode. Appreciate PT evaluation, plans are for home health on discharge.  Polycythemia: Likely from COPD/tobacco use. Hemoglobin much better following hydration. Erythropoietin level borderline low, JAK2 mutation still pending-please follow, if positive, will need outpatient referral to hematology.  Hypothyroidism: Continue levothyroxine  History of COPD: Currently stable-follow  Approximately 20 pound weight loss: Her family has around 20 pound weight loss in the past few months-TSH within normal limits. See above regarding endoscopic evaluation.    History of chronic pain syndrome: Methadone/oxycodone discontinued approximately 3 weeks prior to this admission   Procedures/Studies: None  Discharge Diagnoses:  Principal Problem:   Hyponatremia Active Problems:   Osteoarthritis   IBS (irritable bowel syndrome)   COPD (chronic obstructive pulmonary disease) (HCC)   At high risk for falls   Hypothyroidism   GERD (gastroesophageal reflux disease)   Intractable nausea and vomiting   AKI (acute kidney injury) ([place])   Weight loss   SVT (supraventricular tachycardia) ([place])   Discharge Instructions:  Activity:  As tolerated with Full fall precautions use walker/cane & assistance as neede  Discharge Instructions    AMB Referral to [name]    Complete by:  As directed    Please assign to [facility] for COPD and transition of care. Please assign to [facility] Pharmacist for medication review. Please contact Shenay Torti (husband) for post discharge calls at [phone]. Written consent obtained. Currently at [facility]. Please call with questions. Marthenia Rolling, [place], [facility] Liaison-[phone]   Reason for consult:  Please assign to [facility]  Pharmacist   Diagnoses of:  COPD/ Pneumonia   Expected date of contact:  1-3 days (reserved for hospital discharges)   Call MD for:  persistant nausea and vomiting    Complete by:  As directed    Call MD for:  severe uncontrolled  pain    Complete by:  As directed    Diet - low sodium heart healthy    Complete by:  As directed    Discharge instructions    Complete by:  As directed    Follow with Primary MD  Minette Brine in 1 week  Please follow with your primary GI M.D. (Dr. Earle Gell) if you continue to have epigastric pain  Cardiology office will contact you-you have a appointment for a heart monitor placement next week.  Follow with pain management M.D. and your neurologist in the next few weeks.  Please get a complete blood count and chemistry panel checked by your Primary MD at your next visit, and again as instructed by your Primary MD.  Get Medicines reviewed and adjusted: Please take all your medications with you for your next visit with your Primary MD  Laboratory/radiological data: Please request your Primary MD to go over all hospital tests and procedure/radiological results at the follow up, please ask your Primary MD to get all Hospital records sent to his/her office.  In some cases, they will be blood work, cultures and biopsy results pending at the time of your discharge. Please request that your primary care M.D. follows up on these results.  Also Note the following: If you experience worsening of your admission symptoms, develop shortness of breath, life threatening emergency, suicidal or homicidal thoughts you must seek medical attention immediately by calling 911 or calling your MD immediately  if symptoms less severe.  You must read complete instructions/literature along with all the possible adverse reactions/side effects for all the Medicines you take and that have been prescribed to you. Take any new Medicines after you have completely understood and accpet  all the possible adverse reactions/side effects.   Do not drive when taking Pain medications or sleeping medications (Benzodaizepines)  Do not take more than prescribed Pain, Sleep and Anxiety Medications. It is not advisable to combine anxiety,sleep and pain medications without talking with your primary care practitioner  Special Instructions: If you have smoked or chewed Tobacco  in the last 2 yrs please stop smoking, stop any regular Alcohol  and or any Recreational drug use.  Wear Seat belts while driving.  Please note: You were cared for by a hospitalist during your hospital stay. Once you are discharged, your primary care physician will handle any further medical issues. Please note that NO REFILLS for any discharge medications will be authorized once you are discharged, as it is imperative that you return to your primary care physician (or establish a relationship with a primary care physician if you do not have one) for your post hospital discharge needs so that they can reassess your need for medications and monitor your lab values.   Increase activity slowly    Complete by:  As directed      Allergies as of 07/14/2016      Reactions   Albuterol Other (See Comments)   Had difficulty breathing after a nebulizer treatment   Penicillins Anaphylaxis, Swelling   Has patient had a PCN reaction causing immediate rash, facial/tongue/throat swelling, SOB or lightheadedness with hypotension: Yes Has patient had a PCN reaction causing severe rash involving mucus membranes or skin necrosis: Rash Has patient had a PCN reaction that required hospitalization: No Has patient had a PCN reaction occurring within the last 10 years: No If all of the above answers are "NO", then may proceed with Cephalosporin use.   Ventolin [kdc:albuterol] Shortness Of Breath  Sulfa Antibiotics Swelling   Throat swells, but no shortness of breath noted   Zanaflex [tizanidine] Other (See Comments)   Possible  confusion (??)   Codeine Nausea And Vomiting   Ecotrin [aspirin] Nausea And Vomiting      Medication List    STOP taking these medications   propranolol 20 MG tablet Commonly known as:  INDERAL     TAKE these medications   acetaminophen 325 MG tablet Commonly known as:  TYLENOL Take 2 tablets (650 mg total) by mouth every 6 (six) hours as needed for headache (pain). What changed:  how much to take   diclofenac sodium 1 % Gel Commonly known as:  VOLTAREN Apply 2 g topically 4 (four) times daily as needed. To right chest area for pain   levothyroxine 150 MCG tablet Commonly known as:  SYNTHROID, LEVOTHROID TAKE 1 TO 1+1/2 TABLETS DAILY AS DIRECTED What changed:  See the new instructions.   metoprolol tartrate 25 MG tablet Commonly known as:  LOPRESSOR Take 1 tablet (25 mg total) by mouth 3 (three) times daily.   multivitamin with minerals Tabs tablet Take 1 tablet by mouth daily. Women's Alive Gummies   pantoprazole 40 MG tablet Commonly known as:  PROTONIX Take 1 tablet (40 mg total) by mouth 2 (two) times daily.   promethazine 25 MG tablet Commonly known as:  PHENERGAN Take 25 mg by mouth every 6 (six) hours as needed for nausea or vomiting.   sertraline 100 MG tablet Commonly known as:  ZOLOFT Take 100 mg by mouth at bedtime.      Follow-up Information    Skeet Latch, MD Follow up on 08/31/2016.   Specialty:  Cardiology Why:  at 9:00. for Cardiology follow up for event monitor results. Please call office if having problems and need to be seen sooner. Contact information: [address] [place] [address] [phone]        [facility] Follow up on 07/21/2016.   Specialty:  Cardiology Why:  at 9:00 for event Monitor.  Contact information: [address], [place] [phone]       Andrey Spearman, MD. Schedule an appointment as soon as possible for a visit in 2 week(s).    Specialties:  Neurology, Radiology Contact information: [address] [place] [address] [phone]        Garlan Fair, MD. Schedule an appointment as soon as possible for a visit in 2 week(s).   Specialty:  Gastroenterology Contact information: [address] [place] [address] [phone]        Minette Brine. Schedule an appointment as soon as possible for a visit in 1 day(s).   Specialty:  General Practice Contact information: [place] [address] [phone]          Allergies  Allergen Reactions  . Albuterol Other (See Comments)    Had difficulty breathing after a nebulizer treatment  . Penicillins Anaphylaxis and Swelling    Has patient had a PCN reaction causing immediate rash, facial/tongue/throat swelling, SOB or lightheadedness with hypotension: Yes Has patient had a PCN reaction causing severe rash involving mucus membranes or skin necrosis: Rash Has patient had a PCN reaction that required hospitalization: No Has patient had a PCN reaction occurring within the last 10 years: No If all of the above answers are "NO", then may proceed with Cephalosporin use.  [name] [Kdc:Albuterol] Shortness Of Breath  .  Sulfa Antibiotics Swelling    Throat swells, but no shortness of breath noted  . Zanaflex [Tizanidine] Other (See Comments)    Possible confusion (??)  . Codeine Nausea And Vomiting  . Ecotrin [Aspirin] Nausea And Vomiting     Consultations:   cardiology and GI   Other Procedures/Studies: Ct Abdomen Pelvis Wo Contrast  Result Date: 07/01/2016 CLINICAL DATA:  Diffuse pain all over. Nausea. Weight loss. Difficulty eating. EXAM: CT ABDOMEN AND PELVIS WITHOUT CONTRAST TECHNIQUE: Multidetector CT imaging of the abdomen and pelvis was performed following the standard protocol without IV contrast. COMPARISON:  CT of the abdomen and pelvis 06/02/2016 FINDINGS: Lower chest: The lung  bases are mildly degraded by patient motion. No focal nodule, mass, or airspace disease is present. Hepatobiliary: No focal liver abnormality is seen. Status post cholecystectomy. No biliary dilatation. Pancreas: Unremarkable. No pancreatic ductal dilatation or surrounding inflammatory changes. Spleen: Normal in size without focal abnormality. Adrenals/Urinary Tract: Adrenal glands are normal bilaterally. Kidneys and ureters are within normal limits. There is no stone or hydronephrosis. The urinary bladder is within normal limits. Stomach/Bowel: The stomach and duodenum are within normal limits. Small bowel is unremarkable. Appendix is visualized and normal. The ascending and transverse colon are within normal limits. The descending and sigmoid colon are unremarkable. Vascular/Lymphatic: Aortic atherosclerosis. No enlarged abdominal or pelvic lymph nodes. Reproductive: Uterus and bilateral adnexa are unremarkable. Other: No abdominal wall hernia or abnormality. No abdominopelvic ascites. Musculoskeletal: Multilevel facet degenerative changes are present in the lumbar spine. These are most pronounced at L2-3 and L3-4 with slight anterolisthesis is present. Vertebral body heights alignment contained. There quickly blastic lesions. The bony pelvis is intact. Left total hip arthroplasty is noted. IMPRESSION: 1. No acute or focal lesion to explain the patient's diffuse abdominal pain. 2. Cholecystectomy. 3. Atherosclerosis without aneurysm. 4. Mild degenerative changes of the lumbar spine are likely within normal limits for age. Electronically Signed   By: San Morelle M.D.   On: 07/01/2016 15:33   Dg Ribs Unilateral W/chest Right  Result Date: 07/08/2016 CLINICAL DATA:  Fall EXAM: RIGHT RIBS AND CHEST - 3+ VIEW COMPARISON:  07/01/2016 FINDINGS: Cardiomediastinal silhouette is unremarkable. No infiltrate or pulmonary edema. No right rib fracture is identified. No pneumothorax. Postcholecystectomy surgical  clips are noted. Spinal stimulator wires are noted upper thoracic spine IMPRESSION: No infiltrate or pulmonary edema. No right rib fracture is identified. No pneumothorax. Electronically Signed   By: Lahoma Crocker M.D.   On: 07/08/2016 13:19   Ct Head Wo Contrast  Result Date: 07/08/2016 CLINICAL DATA:  Head trauma secondary to a fall last night. EXAM: CT HEAD WITHOUT CONTRAST TECHNIQUE: Contiguous axial images were obtained from the base of the skull through the vertex without intravenous contrast. COMPARISON:  07/01/2016 FINDINGS: Brain: No evidence of acute infarction, hemorrhage, hydrocephalus, extra-axial collection or mass lesion/mass effect. Vascular: No hyperdense vessel or unexpected calcification. Skull: Normal. Negative for fracture or focal lesion. Sinuses/Orbits: Normal. Other: None IMPRESSION: Stable and negative noncontrast CT appearance of the brain. Electronically Signed   By: Lorriane Shire M.D.   On: 07/08/2016 15:46   Ct Head Wo Contrast  Result Date: 07/01/2016 CLINICAL DATA:  71 year old female with confusion and hypotension. EXAM: CT HEAD WITHOUT CONTRAST TECHNIQUE: Contiguous axial images were obtained from the base of the skull through the vertex without intravenous contrast. COMPARISON:  Head CT without contrast 06/02/2016 and earlier FINDINGS: Brain: Stable cerebral volume. No midline shift, ventriculomegaly, mass effect, evidence of mass lesion, intracranial  hemorrhage or evidence of cortically based acute infarction. Gray-white matter differentiation is within normal limits throughout the brain. Vascular: Calcified atherosclerosis at the skull base. No suspicious intracranial vascular hyperdensity. Skull: Stable and negative. No acute osseous abnormality identified. Sinuses/Orbits: Visualized paranasal sinuses and mastoids are stable and well pneumatized. Other: Stable and negative orbit and scalp soft tissues. IMPRESSION: Stable and negative for age noncontrast CT appearance of the  brain. Electronically Signed   By: Genevie Ann M.D.   On: 07/01/2016 17:43   Ct Chest Wo Contrast  Result Date: 07/10/2016 CLINICAL DATA:  Heterogeneous opacities in the right mid lung zone on a portable chest earlier today. Confusion. Chronic pain syndrome. COPD. Smoker. EXAM: CT CHEST WITHOUT CONTRAST TECHNIQUE: Multidetector CT imaging of the chest was performed following the standard protocol without IV contrast. COMPARISON:  Portable chest obtained earlier today. Chest CT dated 11/18/2014. FINDINGS: Cardiovascular: Atheromatous arterial calcifications, including the aorta and coronary arteries. Normal sized heart. Mediastinum/Nodes: No enlarged mediastinal or axillary lymph nodes. Thyroid gland, trachea, and esophagus demonstrate no significant findings. Lungs/Pleura: Diffuse peribronchial thickening. Minimal bullous change. Pleural and parenchymal scarring at the right lateral lung base without significant change. No lung nodules, airspace consolidation or pleural fluid. Upper Abdomen: Cholecystectomy clips. Musculoskeletal: Thoracic spine degenerative changes. Cervical spine fixation hardware. Neural stimulator leads in the upper thoracic spinal canal. IMPRESSION: 1. Chronic pleural and parenchymal scarring at the right lateral lung base with no acute lung abnormality seen. 2. Stable mild changes of COPD and chronic bronchitis. 3. Coronary artery atherosclerosis and aortic atherosclerosis. Electronically Signed   By: Claudie Revering M.D.   On: 07/10/2016 16:51   Dg Chest Port 1 View  Result Date: 07/12/2016 CLINICAL DATA:  Chest pain EXAM: PORTABLE CHEST 1 VIEW COMPARISON:  07/10/2016 chest radiograph. FINDINGS: Surgical hardware from ACDF is partially visualized overlying the lower cervical spine. Spinal stimulator lead overlies the upper thoracic canal. Stable cardiomediastinal silhouette with normal heart size. No pneumothorax. No pleural effusion. Stable chronic blunting of the right costophrenic angle  compatible with mild pleural-parenchymal scarring. No pulmonary edema. No acute consolidative airspace disease. IMPRESSION: No active cardiopulmonary disease. Electronically Signed   By: Ilona Sorrel M.D.   On: 07/12/2016 19:01   Dg Chest Port 1 View  Result Date: 07/10/2016 CLINICAL DATA:  Patient shortness of breath. EXAM: PORTABLE CHEST 1 VIEW COMPARISON:  Chest radiograph 07/08/2016 FINDINGS: Anterior cervical spinal fusion hardware. Spinal stimulator device projects over the upper thoracic spine. Monitoring leads overlie the patient. Stable cardiac and mediastinal contours. Heterogeneous opacities right mid lung. No pleural effusion or pneumothorax. IMPRESSION: Heterogeneous opacities right mid lung which may represent infection in the appropriate clinical setting. Followup PA and lateral chest X-ray is recommended in 3-4 weeks following trial of antibiotic therapy to ensure resolution and exclude underlying malignancy. Electronically Signed   By: Lovey Newcomer M.D.   On: 07/10/2016 07:38   Portable Chest 1 View  Result Date: 07/01/2016 CLINICAL DATA:  Encephalopathy EXAM: PORTABLE CHEST 1 VIEW COMPARISON:  Portable exam 1620 hours compared to 10/13/2015 FINDINGS: Intraspinal stimulator projects over the upper thoracic spine at T3-T5. Prior cervical fusion. Normal heart size, mediastinal contours, and pulmonary vascularity. Lungs emphysematous but clear. No pulmonary infiltrate, pleural effusion or pneumothorax. Bones demineralized. IMPRESSION: Probable COPD changes without acute infiltrate. Electronically Signed   By: Lavonia Dana M.D.   On: 07/01/2016 16:28   Dg Duanne Limerick  W/kub  Result Date: 07/13/2016 CLINICAL DATA:  Nausea.  Epigastric pain.  Food intolerance.  EXAM: UPPER GI SERIES WITH KUB TECHNIQUE: After obtaining a scout radiograph a routine upper GI series was performed using thin barium FLUOROSCOPY TIME:  Fluoroscopy Time:  2 minutes and 18 seconds Number of Acquired Spot Images: 1 COMPARISON:   Esophagram of 11/17/2014.  CT of 07/01/2016. FINDINGS: Exam is moderately degraded secondary to patient immobility and clinical status. Exam was performed with patient supine and in a minimally LPO oblique position. Preprocedure scout image demonstrates cholecystectomy clips. Mild convex left lumbar spine curvature. Non-obstructive bowel gas pattern. Evaluation of esophageal peristalsis demonstrates an incomplete primary peristaltic wave with mild proximal escape. Full column evaluation of the esophagus demonstrates no persistent narrowing or stricture. Normal single-contrast appearance of the stomach, without mass or ulcer identified. Prompt passage of contrast into the duodenal bulb and C-loop, which are normal in appearance. Normal proximal small bowel caliber. IMPRESSION: 1. Limited, single-contrast exam as described above. 2. Normal appearance of the stomach and duodenum, without explanation for patient's symptoms. 3. Mild esophageal dysmotility, likely presbyesophagus. Electronically Signed   By: Abigail Miyamoto M.D.   On: 07/13/2016 08:27      TODAY-DAY OF DISCHARGE:  Subjective:   Guerry Bruin today has no headache,no chest abdominal pain,no new weakness tingling or numbness, feels much better wants to go home today.   Objective:   Blood pressure 138/70, pulse 71, temperature 98.5 F (36.9 C), temperature source Oral, resp. rate 20, height 5\' 6"  (1.676 m), weight 61.7 kg (136 lb), SpO2 95 %.  Intake/Output Summary (Last 24 hours) at 07/14/16 1000 Last data filed at 07/14/16 0859  Gross per 24 hour  Intake              600 ml  Output              500 ml  Net              100 ml   Filed Weights   07/11/16 0521 07/12/16 0546 07/14/16 0305  Weight: 62.2 kg (137 lb 3.2 oz) 61.4 kg (135 lb 6.4 oz) 61.7 kg (136 lb)    Exam: Awake Alert, Oriented *3, No new F.N deficits, Normal affect [place].AT,PERRAL Supple Neck,No JVD, No cervical lymphadenopathy appriciated.  Symmetrical Chest wall  movement, Good air movement bilaterally, CTAB RRR,No Gallops,Rubs or new Murmurs, No Parasternal Heave +ve B.Sounds, Abd Soft, Non tender, No organomegaly appriciated, No rebound -guarding or rigidity. No Cyanosis, Clubbing or edema, No new Rash or bruise   PERTINENT RADIOLOGIC STUDIES: Ct Abdomen Pelvis Wo Contrast  Result Date: 07/01/2016 CLINICAL DATA:  Diffuse pain all over. Nausea. Weight loss. Difficulty eating. EXAM: CT ABDOMEN AND PELVIS WITHOUT CONTRAST TECHNIQUE: Multidetector CT imaging of the abdomen and pelvis was performed following the standard protocol without IV contrast. COMPARISON:  CT of the abdomen and pelvis 06/02/2016 FINDINGS: Lower chest: The lung bases are mildly degraded by patient motion. No focal nodule, mass, or airspace disease is present. Hepatobiliary: No focal liver abnormality is seen. Status post cholecystectomy. No biliary dilatation. Pancreas: Unremarkable. No pancreatic ductal dilatation or surrounding inflammatory changes. Spleen: Normal in size without focal abnormality. Adrenals/Urinary Tract: Adrenal glands are normal bilaterally. Kidneys and ureters are within normal limits. There is no stone or hydronephrosis. The urinary bladder is within normal limits. Stomach/Bowel: The stomach and duodenum are within normal limits. Small bowel is unremarkable. Appendix is visualized and normal. The ascending and transverse colon are within normal limits. The descending and sigmoid colon are unremarkable. Vascular/Lymphatic: Aortic atherosclerosis. No enlarged abdominal  or pelvic lymph nodes. Reproductive: Uterus and bilateral adnexa are unremarkable. Other: No abdominal wall hernia or abnormality. No abdominopelvic ascites. Musculoskeletal: Multilevel facet degenerative changes are present in the lumbar spine. These are most pronounced at L2-3 and L3-4 with slight anterolisthesis is present. Vertebral body heights alignment contained. There quickly blastic lesions. The bony  pelvis is intact. Left total hip arthroplasty is noted. IMPRESSION: 1. No acute or focal lesion to explain the patient's diffuse abdominal pain. 2. Cholecystectomy. 3. Atherosclerosis without aneurysm. 4. Mild degenerative changes of the lumbar spine are likely within normal limits for age. Electronically Signed   By: San Morelle M.D.   On: 07/01/2016 15:33   Dg Ribs Unilateral W/chest Right  Result Date: 07/08/2016 CLINICAL DATA:  Fall EXAM: RIGHT RIBS AND CHEST - 3+ VIEW COMPARISON:  07/01/2016 FINDINGS: Cardiomediastinal silhouette is unremarkable. No infiltrate or pulmonary edema. No right rib fracture is identified. No pneumothorax. Postcholecystectomy surgical clips are noted. Spinal stimulator wires are noted upper thoracic spine IMPRESSION: No infiltrate or pulmonary edema. No right rib fracture is identified. No pneumothorax. Electronically Signed   By: Lahoma Crocker M.D.   On: 07/08/2016 13:19   Ct Head Wo Contrast  Result Date: 07/08/2016 CLINICAL DATA:  Head trauma secondary to a fall last night. EXAM: CT HEAD WITHOUT CONTRAST TECHNIQUE: Contiguous axial images were obtained from the base of the skull through the vertex without intravenous contrast. COMPARISON:  07/01/2016 FINDINGS: Brain: No evidence of acute infarction, hemorrhage, hydrocephalus, extra-axial collection or mass lesion/mass effect. Vascular: No hyperdense vessel or unexpected calcification. Skull: Normal. Negative for fracture or focal lesion. Sinuses/Orbits: Normal. Other: None IMPRESSION: Stable and negative noncontrast CT appearance of the brain. Electronically Signed   By: Lorriane Shire M.D.   On: 07/08/2016 15:46   Ct Head Wo Contrast  Result Date: 07/01/2016 CLINICAL DATA:  71 year old female with confusion and hypotension. EXAM: CT HEAD WITHOUT CONTRAST TECHNIQUE: Contiguous axial images were obtained from the base of the skull through the vertex without intravenous contrast. COMPARISON:  Head CT without  contrast 06/02/2016 and earlier FINDINGS: Brain: Stable cerebral volume. No midline shift, ventriculomegaly, mass effect, evidence of mass lesion, intracranial hemorrhage or evidence of cortically based acute infarction. Gray-white matter differentiation is within normal limits throughout the brain. Vascular: Calcified atherosclerosis at the skull base. No suspicious intracranial vascular hyperdensity. Skull: Stable and negative. No acute osseous abnormality identified. Sinuses/Orbits: Visualized paranasal sinuses and mastoids are stable and well pneumatized. Other: Stable and negative orbit and scalp soft tissues. IMPRESSION: Stable and negative for age noncontrast CT appearance of the brain. Electronically Signed   By: Genevie Ann M.D.   On: 07/01/2016 17:43   Ct Chest Wo Contrast  Result Date: 07/10/2016 CLINICAL DATA:  Heterogeneous opacities in the right mid lung zone on a portable chest earlier today. Confusion. Chronic pain syndrome. COPD. Smoker. EXAM: CT CHEST WITHOUT CONTRAST TECHNIQUE: Multidetector CT imaging of the chest was performed following the standard protocol without IV contrast. COMPARISON:  Portable chest obtained earlier today. Chest CT dated 11/18/2014. FINDINGS: Cardiovascular: Atheromatous arterial calcifications, including the aorta and coronary arteries. Normal sized heart. Mediastinum/Nodes: No enlarged mediastinal or axillary lymph nodes. Thyroid gland, trachea, and esophagus demonstrate no significant findings. Lungs/Pleura: Diffuse peribronchial thickening. Minimal bullous change. Pleural and parenchymal scarring at the right lateral lung base without significant change. No lung nodules, airspace consolidation or pleural fluid. Upper Abdomen: Cholecystectomy clips. Musculoskeletal: Thoracic spine degenerative changes. Cervical spine fixation hardware. Neural stimulator leads in the  upper thoracic spinal canal. IMPRESSION: 1. Chronic pleural and parenchymal scarring at the right  lateral lung base with no acute lung abnormality seen. 2. Stable mild changes of COPD and chronic bronchitis. 3. Coronary artery atherosclerosis and aortic atherosclerosis. Electronically Signed   By: Claudie Revering M.D.   On: 07/10/2016 16:51   Dg Chest Port 1 View  Result Date: 07/12/2016 CLINICAL DATA:  Chest pain EXAM: PORTABLE CHEST 1 VIEW COMPARISON:  07/10/2016 chest radiograph. FINDINGS: Surgical hardware from ACDF is partially visualized overlying the lower cervical spine. Spinal stimulator lead overlies the upper thoracic canal. Stable cardiomediastinal silhouette with normal heart size. No pneumothorax. No pleural effusion. Stable chronic blunting of the right costophrenic angle compatible with mild pleural-parenchymal scarring. No pulmonary edema. No acute consolidative airspace disease. IMPRESSION: No active cardiopulmonary disease. Electronically Signed   By: Ilona Sorrel M.D.   On: 07/12/2016 19:01   Dg Chest Port 1 View  Result Date: 07/10/2016 CLINICAL DATA:  Patient shortness of breath. EXAM: PORTABLE CHEST 1 VIEW COMPARISON:  Chest radiograph 07/08/2016 FINDINGS: Anterior cervical spinal fusion hardware. Spinal stimulator device projects over the upper thoracic spine. Monitoring leads overlie the patient. Stable cardiac and mediastinal contours. Heterogeneous opacities right mid lung. No pleural effusion or pneumothorax. IMPRESSION: Heterogeneous opacities right mid lung which may represent infection in the appropriate clinical setting. Followup PA and lateral chest X-ray is recommended in 3-4 weeks following trial of antibiotic therapy to ensure resolution and exclude underlying malignancy. Electronically Signed   By: Lovey Newcomer M.D.   On: 07/10/2016 07:38   Portable Chest 1 View  Result Date: 07/01/2016 CLINICAL DATA:  Encephalopathy EXAM: PORTABLE CHEST 1 VIEW COMPARISON:  Portable exam 1620 hours compared to 10/13/2015 FINDINGS: Intraspinal stimulator projects over the upper  thoracic spine at T3-T5. Prior cervical fusion. Normal heart size, mediastinal contours, and pulmonary vascularity. Lungs emphysematous but clear. No pulmonary infiltrate, pleural effusion or pneumothorax. Bones demineralized. IMPRESSION: Probable COPD changes without acute infiltrate. Electronically Signed   By: Lavonia Dana M.D.   On: 07/01/2016 16:28   Dg Duanne Limerick  W/kub  Result Date: 07/13/2016 CLINICAL DATA:  Nausea.  Epigastric pain.  Food intolerance. EXAM: UPPER GI SERIES WITH KUB TECHNIQUE: After obtaining a scout radiograph a routine upper GI series was performed using thin barium FLUOROSCOPY TIME:  Fluoroscopy Time:  2 minutes and 18 seconds Number of Acquired Spot Images: 1 COMPARISON:  Esophagram of 11/17/2014.  CT of 07/01/2016. FINDINGS: Exam is moderately degraded secondary to patient immobility and clinical status. Exam was performed with patient supine and in a minimally LPO oblique position. Preprocedure scout image demonstrates cholecystectomy clips. Mild convex left lumbar spine curvature. Non-obstructive bowel gas pattern. Evaluation of esophageal peristalsis demonstrates an incomplete primary peristaltic wave with mild proximal escape. Full column evaluation of the esophagus demonstrates no persistent narrowing or stricture. Normal single-contrast appearance of the stomach, without mass or ulcer identified. Prompt passage of contrast into the duodenal bulb and C-loop, which are normal in appearance. Normal proximal small bowel caliber. IMPRESSION: 1. Limited, single-contrast exam as described above. 2. Normal appearance of the stomach and duodenum, without explanation for patient's symptoms. 3. Mild esophageal dysmotility, likely presbyesophagus. Electronically Signed   By: Abigail Miyamoto M.D.   On: 07/13/2016 08:27     PERTINENT LAB RESULTS: CBC: No results for input(s): WBC, HGB, HCT, PLT in the last 72 hours. CMET CMP     Component Value Date/Time   NA 129 (L) 07/13/2016 0448   K  3.9 07/13/2016 0448   CL 94 (L) 07/13/2016 0448   CO2 28 07/13/2016 0448   GLUCOSE 111 (H) 07/13/2016 0448   BUN 7 07/13/2016 0448   CREATININE 0.68 07/13/2016 0448   CREATININE 0.87 12/17/2015 1304   CALCIUM 9.0 07/13/2016 0448   PROT 5.7 (L) 07/13/2016 0448   ALBUMIN 3.1 (L) 07/13/2016 0448   AST 19 07/13/2016 0448   ALT 21 07/13/2016 0448   ALKPHOS 53 07/13/2016 0448   BILITOT 0.6 07/13/2016 0448   GFRNONAA >60 07/13/2016 0448   GFRNONAA 65 04/28/2015 1525   GFRAA >60 07/13/2016 0448   GFRAA 75 04/28/2015 1525    GFR Estimated Creatinine Clearance: 59.5 mL/min (by C-G formula based on SCr of 0.68 mg/dL). No results for input(s): LIPASE, AMYLASE in the last 72 hours. No results for input(s): CKTOTAL, CKMB, CKMBINDEX, TROPONINI in the last 72 hours. Invalid input(s): POCBNP No results for input(s): DDIMER in the last 72 hours. No results for input(s): HGBA1C in the last 72 hours. No results for input(s): CHOL, HDL, LDLCALC, TRIG, CHOLHDL, LDLDIRECT in the last 72 hours. No results for input(s): TSH, T4TOTAL, T3FREE, THYROIDAB in the last 72 hours.  Invalid input(s): FREET3 No results for input(s): VITAMINB12, FOLATE, FERRITIN, TIBC, IRON, RETICCTPCT in the last 72 hours. Coags: No results for input(s): INR in the last 72 hours.  Invalid input(s): PT Microbiology: Recent Results (from the past 240 hour(s))  Culture, blood (routine x 2)     Status: None (Preliminary result)   Collection Time: 07/09/16  6:16 AM  Result Value Ref Range Status   Specimen Description BLOOD LEFT ARM  Final   Special Requests IN PEDIATRIC BOTTLE Blood Culture adequate volume  Final   Culture NO GROWTH 4 DAYS  Final   Report Status PENDING  Incomplete  Culture, blood (routine x 2)     Status: None (Preliminary result)   Collection Time: 07/09/16  6:21 AM  Result Value Ref Range Status   Specimen Description BLOOD LEFT ARM  Final   Special Requests IN PEDIATRIC BOTTLE Blood Culture adequate  volume  Final   Culture NO GROWTH 4 DAYS  Final   Report Status PENDING  Incomplete  Culture, Urine     Status: Abnormal   Collection Time: 07/09/16  3:29 PM  Result Value Ref Range Status   Specimen Description URINE, RANDOM  Final   Special Requests NONE  Final   Culture <10,000 COLONIES/mL INSIGNIFICANT GROWTH (A)  Final   Report Status 07/10/2016 FINAL  Final  Culture, Urine     Status: Abnormal   Collection Time: 07/11/16  3:57 AM  Result Value Ref Range Status   Specimen Description URINE, CLEAN CATCH  Final   Special Requests NONE  Final   Culture MULTIPLE SPECIES PRESENT, SUGGEST RECOLLECTION (A)  Final   Report Status 07/12/2016 FINAL  Final    FURTHER DISCHARGE INSTRUCTIONS:  Get Medicines reviewed and adjusted: Please take all your medications with you for your next visit with your Primary MD  Laboratory/radiological data: Please request your Primary MD to go over all hospital tests and procedure/radiological results at the follow up, please ask your Primary MD to get all Hospital records sent to his/her office.  In some cases, they will be blood work, cultures and biopsy results pending at the time of your discharge. Please request that your primary care M.D. goes through all the records of your hospital data and follows up on these results.  Also Note the following:  If you experience worsening of your admission symptoms, develop shortness of breath, life threatening emergency, suicidal or homicidal thoughts you must seek medical attention immediately by calling 911 or calling your MD immediately  if symptoms less severe.  You must read complete instructions/literature along with all the possible adverse reactions/side effects for all the Medicines you take and that have been prescribed to you. Take any new Medicines after you have completely understood and accpet all the possible adverse reactions/side effects.   Do not drive when taking Pain medications or sleeping  medications (Benzodaizepines)  Do not take more than prescribed Pain, Sleep and Anxiety Medications. It is not advisable to combine anxiety,sleep and pain medications without talking with your primary care practitioner  Special Instructions: If you have smoked or chewed Tobacco  in the last 2 yrs please stop smoking, stop any regular Alcohol  and or any Recreational drug use.  Wear Seat belts while driving.  Please note: You were cared for by a hospitalist during your hospital stay. Once you are discharged, your primary care physician will handle any further medical issues. Please note that NO REFILLS for any discharge medications will be authorized once you are discharged, as it is imperative that you return to your primary care physician (or establish a relationship with a primary care physician if you do not have one) for your post hospital discharge needs so that they can reassess your need for medications and monitor your lab values.  Total Time spent coordinating discharge including counseling, education and face to face time equals 45 minutes.  Signed: Shanker Ghimire 07/14/2016 10:00 AM

## 2016-07-14 NOTE — Progress Notes (Signed)
Progress Note  Patient Name: Destiny Hughes Date of Encounter: 07/14/2016  Primary Cardiologist: Dr. Oval Linsey.   Subjective   She was awake all night long last night.  Sleeping now.    Inpatient Medications    Scheduled Meds: . aspirin EC  81 mg Oral Daily  . diclofenac sodium  2 g Topical QID  . enoxaparin (LOVENOX) injection  40 mg Subcutaneous Q24H  . levothyroxine  150 mcg Oral QAC breakfast  . metoprolol tartrate  25 mg Oral TID  . multivitamin with minerals  1 tablet Oral Daily  . pantoprazole  40 mg Oral BID  . sodium chloride flush  3 mL Intravenous Q12H  . sucralfate  1 g Oral TID WC & HS  . thiamine  100 mg Oral Daily   Continuous Infusions:  PRN Meds: acetaminophen **OR** acetaminophen, metoprolol, nitroGLYCERIN, ondansetron **OR** ondansetron (ZOFRAN) IV, traMADol   Vital Signs    Vitals:   07/13/16 2141 07/13/16 2157 07/14/16 0305 07/14/16 0528  BP:  134/68  (!) 145/72  Pulse: 78 64  (!) 56  Resp:  20  20  Temp:  97.6 F (36.4 C)  98.5 F (36.9 C)  TempSrc:  Oral  Oral  SpO2:  96%  94%  Weight:   136 lb (61.7 kg)   Height:        Intake/Output Summary (Last 24 hours) at 07/14/16 0934 Last data filed at 07/14/16 0859  Gross per 24 hour  Intake              600 ml  Output              500 ml  Net              100 ml   Filed Weights   07/11/16 0521 07/12/16 0546 07/14/16 0305  Weight: 137 lb 3.2 oz (62.2 kg) 135 lb 6.4 oz (61.4 kg) 136 lb (61.7 kg)    Telemetry    NSR.  Artifact listed as asystole.  This was secondary to leads coming off.  Verified with nursing.  Patient was awake and talking when this was recorded.   - Personally Reviewed  ECG    NA - Personally Reviewed  Physical Exam   GEN:    Chronically ill appearing Neck: No  JVD Cardiac: RRR, no murmurs Respiratory:   Clear and no wheezing GI: Soft, nontender, non-distended  MS: No  edema; No deformity.   Labs    Chemistry  Recent Labs Lab 07/11/16 0459  07/12/16 0207 07/13/16 0448  NA 130* 129* 129*  K 3.0* 3.6 3.9  CL 102 97* 94*  CO2 22 24 28   GLUCOSE 101* 119* 111*  BUN 14 8 7   CREATININE 0.78 0.70 0.68  CALCIUM 8.5* 8.6* 9.0  PROT  --   --  5.7*  ALBUMIN  --   --  3.1*  AST  --   --  19  ALT  --   --  21  ALKPHOS  --   --  53  BILITOT  --   --  0.6  GFRNONAA >60 >60 >60  GFRAA >60 >60 >60  ANIONGAP 6 8 7      Hematology  Recent Labs Lab 07/09/16 0612 07/10/16 0445 07/11/16 0459  WBC 13.1* 9.6 10.2  RBC 5.50* 4.75 4.59  HGB 16.3* 13.7 13.2  HCT 45.0 38.9 37.8  MCV 81.8 81.9 82.4  MCH 29.6 28.8 28.8  MCHC 36.2* 35.2 34.9  RDW  13.7 14.2 14.5  PLT 277 267 275    Cardiac EnzymesNo results for input(s): TROPONINI in the last 168 hours. No results for input(s): TROPIPOC in the last 168 hours.   BNPNo results for input(s): BNP, PROBNP in the last 168 hours.   DDimer No results for input(s): DDIMER in the last 168 hours.   Radiology    Dg Chest Port 1 View  Result Date: 07/12/2016 CLINICAL DATA:  Chest pain EXAM: PORTABLE CHEST 1 VIEW COMPARISON:  07/10/2016 chest radiograph. FINDINGS: Surgical hardware from ACDF is partially visualized overlying the lower cervical spine. Spinal stimulator lead overlies the upper thoracic canal. Stable cardiomediastinal silhouette with normal heart size. No pneumothorax. No pleural effusion. Stable chronic blunting of the right costophrenic angle compatible with mild pleural-parenchymal scarring. No pulmonary edema. No acute consolidative airspace disease. IMPRESSION: No active cardiopulmonary disease. Electronically Signed   By: Ilona Sorrel M.D.   On: 07/12/2016 19:01   Dg Ugi  W/kub  Result Date: 07/13/2016 CLINICAL DATA:  Nausea.  Epigastric pain.  Food intolerance. EXAM: UPPER GI SERIES WITH KUB TECHNIQUE: After obtaining a scout radiograph a routine upper GI series was performed using thin barium FLUOROSCOPY TIME:  Fluoroscopy Time:  2 minutes and 18 seconds Number of Acquired  Spot Images: 1 COMPARISON:  Esophagram of 11/17/2014.  CT of 07/01/2016. FINDINGS: Exam is moderately degraded secondary to patient immobility and clinical status. Exam was performed with patient supine and in a minimally LPO oblique position. Preprocedure scout image demonstrates cholecystectomy clips. Mild convex left lumbar spine curvature. Non-obstructive bowel gas pattern. Evaluation of esophageal peristalsis demonstrates an incomplete primary peristaltic wave with mild proximal escape. Full column evaluation of the esophagus demonstrates no persistent narrowing or stricture. Normal single-contrast appearance of the stomach, without mass or ulcer identified. Prompt passage of contrast into the duodenal bulb and C-loop, which are normal in appearance. Normal proximal small bowel caliber. IMPRESSION: 1. Limited, single-contrast exam as described above. 2. Normal appearance of the stomach and duodenum, without explanation for patient's symptoms. 3. Mild esophageal dysmotility, likely presbyesophagus. Electronically Signed   By: Abigail Miyamoto M.D.   On: 07/13/2016 08:27    Cardiac Studies   NA  Patient Profile     71 y.o. female with a history of tobacco use, hypertension, mitral valve prolapse, COPD, hyperlipidemia, pSVT, and hypothyroidism; who is being seen in consultation at the request of Dr. Lovena Neighbours for narrow complex rhythm with rapid ventricular rate.  Assessment & Plan    SVT:   HR was low at times but she seemed to tolerate the low dose beta blocker with PM dose.  Looks like she did not get the AM dose.  She is scheduled to have an event monitor placed in our [employer] on 5/3.     Signed, Minus Breeding, MD  07/14/2016, 9:34 AM

## 2016-07-15 ENCOUNTER — Other Ambulatory Visit: Payer: Self-pay

## 2016-07-15 NOTE — Patient Outreach (Signed)
[facility]) Care Management  07/15/2016   Destiny Hughes Apr 26, 1945 [id number]  Subjective:  Spoke to patient's husband, [name] as directed by referral. [name] states he is getting things together. [name] stated patient (his wife was resting)  Objective:  Telephonic encounter  Current Medications:  Current Outpatient Prescriptions  Medication Sig Dispense Refill  . acetaminophen (TYLENOL) 325 MG tablet Take 2 tablets (650 mg total) by mouth every 6 (six) hours as needed for headache (pain).    Marland Kitchen diclofenac sodium (VOLTAREN) 1 % GEL Apply 2 g topically 4 (four) times daily as needed. To right chest area for pain 100 g 0  . levothyroxine (SYNTHROID, LEVOTHROID) 150 MCG tablet TAKE 1 TO 1+1/2 TABLETS DAILY AS DIRECTED (Patient taking differently: Take 1 1/2 tablet (225 mcg) by mouth on Tuesday, Thursday before breakfast, take 1 tablet (150 mcg) before breakfast on Sunday, Monday, Wednesday, Friday, Saturday) 135 tablet 1  . metoprolol tartrate (LOPRESSOR) 25 MG tablet Take 1 tablet (25 mg total) by mouth 3 (three) times daily. 90 tablet 0  . Multiple Vitamin (MULTIVITAMIN WITH MINERALS) TABS tablet Take 1 tablet by mouth daily. Women's Alive Gummies    . pantoprazole (PROTONIX) 40 MG tablet Take 1 tablet (40 mg total) by mouth 2 (two) times daily. 60 tablet 0  . promethazine (PHENERGAN) 25 MG tablet Take 25 mg by mouth every 6 (six) hours as needed for nausea or vomiting.    . sertraline (ZOLOFT) 100 MG tablet Take 100 mg by mouth at bedtime.     No current facility-administered medications for this visit.     Functional Status:  In your present state of health, do you have any difficulty performing the following activities: 07/15/2016 07/08/2016  Hearing? N N  Vision? Y N  Difficulty concentrating or making decisions? Y N  Walking or climbing stairs? Y N  Dressing or bathing? Y N  Doing errands, shopping? Y N  Preparing Food and eating ? N -  Using the  Toilet? N -  In the past six months, have you accidently leaked urine? N -  Do you have problems with loss of bowel control? N -  Managing your Medications? Y -  Managing your Finances? Y -  Housekeeping or managing your Housekeeping? Y -  Some recent data might be hidden    Fall/Depression Screening: Fall Risk  07/15/2016 04/28/2015 10/03/2014  Falls in the past year? - No Yes  Number falls in past yr: - - 2 or more  Injury with Fall? - - No  Risk Factor Category  - - High Fall Risk  Risk for fall due to : Impaired balance/gait;Impaired mobility;Impaired vision;Medication side effect - History of fall(s);Impaired balance/gait;Impaired mobility  Follow up - - Falls prevention discussed;Follow up appointment   PHQ 2/9 Scores 07/15/2016 04/28/2015 10/03/2014 05/21/2014  PHQ - 2 Score 0 0 2 0  PHQ- 9 Score - - 6 -   Fall Risk  07/15/2016 04/28/2015 10/03/2014 05/21/2014  Falls in the past year? - No Yes No  Number falls in past yr: - - 2 or more -  Injury with Fall? - - No -  Risk Factor Category  - - High Fall Risk -  Risk for fall due to : Impaired balance/gait;Impaired mobility;Impaired vision;Medication side effect - History of fall(s);Impaired balance/gait;Impaired mobility -  Follow up - - Falls prevention discussed;Follow up appointment -    [facility] CM Care Plan Problem One     Most Recent Value  Care Plan Problem One  Patient recently discharged from the hospital  Role Documenting the Problem One  Care Management [place] for Problem One  Active  THN Long Term Goal (31-90 days)  Patient will have no acute care admissions in the next 31 days.  THN Long Term Goal Start Date  07/15/16  THN CM Short Term Goal #1 (0-30 days)  In the next 28 days, patient will meet with the [facility] RNCM  for assessment of community to assess her need for community resources  [facility] CM Short Term Goal #1 Start Date  07/15/16  Interventions for Short Term Goal #1  Transition of care assesment completed      Assessment:  Patient recently discharged from the acute care setting. Patient's husband agreed to home visit, visit scheduled for the next 21 days.  Plan:   Telephone and home visit contact in the next 21 days for chronic disease education, assessment of need for community care coordination.

## 2016-07-16 DIAGNOSIS — M542 Cervicalgia: Secondary | ICD-10-CM | POA: Diagnosis not present

## 2016-07-16 DIAGNOSIS — E785 Hyperlipidemia, unspecified: Secondary | ICD-10-CM | POA: Diagnosis not present

## 2016-07-16 DIAGNOSIS — J449 Chronic obstructive pulmonary disease, unspecified: Secondary | ICD-10-CM | POA: Diagnosis not present

## 2016-07-16 DIAGNOSIS — I5030 Unspecified diastolic (congestive) heart failure: Secondary | ICD-10-CM | POA: Diagnosis not present

## 2016-07-16 DIAGNOSIS — I110 Hypertensive heart disease with heart failure: Secondary | ICD-10-CM | POA: Diagnosis not present

## 2016-07-16 DIAGNOSIS — G894 Chronic pain syndrome: Secondary | ICD-10-CM | POA: Diagnosis not present

## 2016-07-18 DIAGNOSIS — G894 Chronic pain syndrome: Secondary | ICD-10-CM | POA: Diagnosis not present

## 2016-07-18 DIAGNOSIS — I110 Hypertensive heart disease with heart failure: Secondary | ICD-10-CM | POA: Diagnosis not present

## 2016-07-18 DIAGNOSIS — E785 Hyperlipidemia, unspecified: Secondary | ICD-10-CM | POA: Diagnosis not present

## 2016-07-18 DIAGNOSIS — J449 Chronic obstructive pulmonary disease, unspecified: Secondary | ICD-10-CM | POA: Diagnosis not present

## 2016-07-18 DIAGNOSIS — M542 Cervicalgia: Secondary | ICD-10-CM | POA: Diagnosis not present

## 2016-07-18 DIAGNOSIS — I5030 Unspecified diastolic (congestive) heart failure: Secondary | ICD-10-CM | POA: Diagnosis not present

## 2016-07-20 DIAGNOSIS — N179 Acute kidney failure, unspecified: Secondary | ICD-10-CM | POA: Diagnosis not present

## 2016-07-20 DIAGNOSIS — I471 Supraventricular tachycardia: Secondary | ICD-10-CM | POA: Diagnosis not present

## 2016-07-20 DIAGNOSIS — G9340 Encephalopathy, unspecified: Secondary | ICD-10-CM | POA: Diagnosis not present

## 2016-07-20 DIAGNOSIS — E871 Hypo-osmolality and hyponatremia: Secondary | ICD-10-CM | POA: Diagnosis not present

## 2016-07-20 LAB — JAK2 EXONS 12-15

## 2016-07-20 LAB — JAK2  V617F QUAL. WITH REFLEX TO EXON 12

## 2016-07-21 ENCOUNTER — Ambulatory Visit (INDEPENDENT_AMBULATORY_CARE_PROVIDER_SITE_OTHER): Payer: Medicare Other

## 2016-07-21 ENCOUNTER — Other Ambulatory Visit: Payer: Self-pay

## 2016-07-21 DIAGNOSIS — J449 Chronic obstructive pulmonary disease, unspecified: Secondary | ICD-10-CM | POA: Diagnosis not present

## 2016-07-21 DIAGNOSIS — I110 Hypertensive heart disease with heart failure: Secondary | ICD-10-CM | POA: Diagnosis not present

## 2016-07-21 DIAGNOSIS — G894 Chronic pain syndrome: Secondary | ICD-10-CM | POA: Diagnosis not present

## 2016-07-21 DIAGNOSIS — I471 Supraventricular tachycardia: Secondary | ICD-10-CM

## 2016-07-21 DIAGNOSIS — E785 Hyperlipidemia, unspecified: Secondary | ICD-10-CM | POA: Diagnosis not present

## 2016-07-21 DIAGNOSIS — I5030 Unspecified diastolic (congestive) heart failure: Secondary | ICD-10-CM | POA: Diagnosis not present

## 2016-07-21 DIAGNOSIS — M542 Cervicalgia: Secondary | ICD-10-CM | POA: Diagnosis not present

## 2016-07-21 NOTE — Patient Outreach (Signed)
    Unsuccessful attempt made to contact patient via telephone.  Plan: Home visit previously scheduled later this month.

## 2016-07-22 ENCOUNTER — Other Ambulatory Visit: Payer: Self-pay | Admitting: Pharmacist

## 2016-07-22 NOTE — Patient Outreach (Signed)
[facility]) Care Management  07/22/2016  Akeylah Hendel Hagemeister [birth year] [id number]  Patient was referred to [place] by [facility] Liaison [place] due to patient's spouse report of needing help figuring out patient's medications.   Successful phone outreach to patient's spouse, on [facility] Consent form, and he is noted to be contact for patient.  Explained purpose of call and spouse reports he has medications figured out at this time and denies questions/concerns about patient's medications.    Based on chart review, appears there have been medication changes with recent hospital discharges.    Offered to review medications patient is taking and patient's spouse declined.    Plan:  Placed call to Drew, to update her that patient's spouse declined review of medications or pharmacy needs.   Will not open pharmacy case due to refusal and spouse denying pharmacy related needs for patient.   Karrie Meres, PharmD, [place] [phone]

## 2016-07-25 DIAGNOSIS — M542 Cervicalgia: Secondary | ICD-10-CM | POA: Diagnosis not present

## 2016-07-25 DIAGNOSIS — E785 Hyperlipidemia, unspecified: Secondary | ICD-10-CM | POA: Diagnosis not present

## 2016-07-25 DIAGNOSIS — G894 Chronic pain syndrome: Secondary | ICD-10-CM | POA: Diagnosis not present

## 2016-07-25 DIAGNOSIS — I110 Hypertensive heart disease with heart failure: Secondary | ICD-10-CM | POA: Diagnosis not present

## 2016-07-25 DIAGNOSIS — J449 Chronic obstructive pulmonary disease, unspecified: Secondary | ICD-10-CM | POA: Diagnosis not present

## 2016-07-25 DIAGNOSIS — I5030 Unspecified diastolic (congestive) heart failure: Secondary | ICD-10-CM | POA: Diagnosis not present

## 2016-07-26 ENCOUNTER — Other Ambulatory Visit: Payer: Self-pay

## 2016-07-27 ENCOUNTER — Other Ambulatory Visit: Payer: Self-pay | Admitting: Pharmacist

## 2016-07-27 NOTE — Patient Outreach (Signed)
[facility]) Care Management  07/27/2016  Mertice Uffelman Krabill 11-Apr-1945 [id number]  Patient was referred back to [place] by [place] for medication education and reconciliation.    Successful phone outreach to patient's spouse on [facility] Consent form, HIPAA details verified by patient's spouse.    Spouse reports that now he would like to meet with [place] to review patient's medications.  He prefers a home visit versus phone.    Plan:  Home visit scheduled.   Karrie Meres, PharmD, [place] [phone]

## 2016-07-27 NOTE — Patient Outreach (Signed)
[facility]) Care Management   07/26/2016  Brenlee Koskela Read 01-03-1946 [id number]  Phillis Thackeray Wessner is an 71 y.o. female  Subjective:  I am doing better each day. I have not fallen   Objective:   ROS   slender, frail elderly lady using cane to ambulate  Physical Exam  ROS  Encounter Medications:   Outpatient Encounter Prescriptions as of 07/26/2016  Medication Sig Note  . acetaminophen (TYLENOL) 325 MG tablet Take 2 tablets (650 mg total) by mouth every 6 (six) hours as needed for headache (pain).   Marland Kitchen diclofenac sodium (VOLTAREN) 1 % GEL Apply 2 g topically 4 (four) times daily as needed. To right chest area for pain   . levothyroxine (SYNTHROID, LEVOTHROID) 150 MCG tablet TAKE 1 TO 1+1/2 TABLETS DAILY AS DIRECTED (Patient taking differently: Take 1 1/2 tablet (225 mcg) by mouth on Tuesday, Thursday before breakfast, take 1 tablet (150 mcg) before breakfast on Sunday, Monday, Wednesday, Friday, Saturday)   . metoprolol tartrate (LOPRESSOR) 25 MG tablet Take 1 tablet (25 mg total) by mouth 3 (three) times daily.   . Multiple Vitamin (MULTIVITAMIN WITH MINERALS) TABS tablet Take 1 tablet by mouth daily. Women's Alive Gummies   . pantoprazole (PROTONIX) 40 MG tablet Take 1 tablet (40 mg total) by mouth 2 (two) times daily.   . promethazine (PHENERGAN) 25 MG tablet Take 25 mg by mouth every 6 (six) hours as needed for nausea or vomiting.   . sertraline (ZOLOFT) 100 MG tablet Take 100 mg by mouth at bedtime. 07/08/2016: Per spouse pt does have this at home and is still taking   No facility-administered encounter medications on file as of 07/26/2016.     Functional Status:   In your present state of health, do you have any difficulty performing the following activities: 07/15/2016 07/08/2016  Hearing? N N  Vision? Y N  Difficulty concentrating or making decisions? Y N  Walking or climbing stairs? Y N  Dressing or bathing? Y N  Doing errands, shopping? Y N   Preparing Food and eating ? N -  Using the Toilet? N -  In the past six months, have you accidently leaked urine? N -  Do you have problems with loss of bowel control? N -  Managing your Medications? Y -  Managing your Finances? Y -  Housekeeping or managing your Housekeeping? Y -  Some recent data might be hidden    Fall/Depression Screening:    Fall Risk  07/26/2016 07/15/2016 04/28/2015  Falls in the past year? Yes - No  Number falls in past yr: 2 or more - -  Injury with Fall? Yes - -  Risk Factor Category  High Fall Risk - -  Risk for fall due to : History of fall(s);Impaired balance/gait;Impaired mobility;Impaired vision Impaired balance/gait;Impaired mobility;Impaired vision;Medication side effect -  Follow up Falls prevention discussed - -   PHQ 2/9 Scores 07/26/2016 07/15/2016 04/28/2015 10/03/2014 05/21/2014  PHQ - 2 Score 0 0 0 2 0  PHQ- 9 Score - - - 6 -   THN CM Care Plan Problem One     Most Recent Value  Care Plan Problem One  Patient recently discharged from the hospital  Role Documenting the Problem One  Care Management [place] for Problem One  Active  THN Long Term Goal (31-90 days)  Patient will have no acute care admissions in the next 31 days.  THN Long Term Goal Start Date  07/15/16  Interventions for Problem One Long Term Goal  initial home visit to assess for community care coordination needs.  THN CM Short Term Goal #1 (0-30 days)  In the next 28 days, patient will meet with the [facility] RNCM  for assessment of community to assess her need for community resources  [facility] CM Short Term Goal #1 Start Date  07/15/16  Interventions for Short Term Goal #1  iniitla home visit    [facility] CM Care Plan Problem Two     Most Recent Value  Care Plan Problem Two  patient and caregiver will meet with [facility] pharmacist for medicaiton education in the next 28 days   Role Documenting the Problem Two  Care Management Coordinator     Assessment:   Patient, husband and son present  during this home visit. Patient, son and husband all agreed on the need for a pharmacy consult for education and reconciliation due to medication confusion. RNCM made pharmacy referral. CM reviewed with all on fall prevention, advanced planning.  Plan:  Telephone contact in the next 21 days for assessment of community care coordination  Needs.

## 2016-07-28 ENCOUNTER — Other Ambulatory Visit: Payer: Self-pay | Admitting: Pharmacist

## 2016-07-28 DIAGNOSIS — M542 Cervicalgia: Secondary | ICD-10-CM | POA: Diagnosis not present

## 2016-07-28 DIAGNOSIS — J449 Chronic obstructive pulmonary disease, unspecified: Secondary | ICD-10-CM | POA: Diagnosis not present

## 2016-07-28 DIAGNOSIS — I5030 Unspecified diastolic (congestive) heart failure: Secondary | ICD-10-CM | POA: Diagnosis not present

## 2016-07-28 DIAGNOSIS — I110 Hypertensive heart disease with heart failure: Secondary | ICD-10-CM | POA: Diagnosis not present

## 2016-07-28 DIAGNOSIS — G894 Chronic pain syndrome: Secondary | ICD-10-CM | POA: Diagnosis not present

## 2016-07-28 DIAGNOSIS — E785 Hyperlipidemia, unspecified: Secondary | ICD-10-CM | POA: Diagnosis not present

## 2016-07-28 NOTE — Patient Outreach (Signed)
[facility]) Care Management  [place]   07/28/2016  Sherron Mapp Heinicke 1945-03-28 [id number]  Subjective:  Initial home visit completed with patient.  Also present were patient's son and spouse which patient consented to. [employer] rotation student accompanied [facility] Pharmacist during home visit.     Patient deferred discussion regarding her medications to her spouse and son.   Her son reports his wife fills pill box weekly for patient.  Patient's husband reports he gives patient her medication.  Patient's husband expresses confusion on how and when to give patient promethazine.  Her spouse reports patient gets drowsy when taking promethazine and inquires about ondansetron.     Patient's spouse reports he has taken previously discontinued medications and placed in a cabinet away from patient's current active medications.    Patient was admitted 4/20-4/26/18 with hyponatremia and acute kidney injury and also 4/13-4/15/18.   She had multiple medications discontinued during previous hospitalization.   Objective:   Encounter Medications: Outpatient Encounter Prescriptions as of 07/28/2016  Medication Sig Note  . acetaminophen (TYLENOL) 325 MG tablet Take 2 tablets (650 mg total) by mouth every 6 (six) hours as needed for headache (pain).   Marland Kitchen diclofenac sodium (VOLTAREN) 1 % GEL Apply 2 g topically 4 (four) times daily as needed. To right chest area for pain   . levothyroxine (SYNTHROID, LEVOTHROID) 150 MCG tablet TAKE 1 TO 1+1/2 TABLETS DAILY AS DIRECTED (Patient taking differently: Take 1 1/2 tablet (225 mcg) by mouth on Tuesday, Thursday before breakfast, take 1 tablet (150 mcg) before breakfast on Sunday, Monday, Wednesday, Friday, Saturday)   . metoprolol tartrate (LOPRESSOR) 25 MG tablet Take 1 tablet (25 mg total) by mouth 3 (three) times daily.   . Multiple Vitamin (MULTIVITAMIN WITH MINERALS) TABS tablet Take 1 tablet by mouth daily. Women's Alive Gummies    . pantoprazole (PROTONIX) 40 MG tablet Take 1 tablet (40 mg total) by mouth 2 (two) times daily.   . promethazine (PHENERGAN) 25 MG tablet Take 25 mg by mouth every 6 (six) hours as needed for nausea or vomiting.   . sertraline (ZOLOFT) 100 MG tablet Take 100 mg by mouth at bedtime. 07/08/2016: Per spouse pt does have this at home and is still taking   No facility-administered encounter medications on file as of 07/28/2016.     Functional Status: In your present state of health, do you have any difficulty performing the following activities: 07/15/2016 07/08/2016  Hearing? N N  Vision? Y N  Difficulty concentrating or making decisions? Y N  Walking or climbing stairs? Y N  Dressing or bathing? Y N  Doing errands, shopping? Y N  Preparing Food and eating ? N -  Using the Toilet? N -  In the past six months, have you accidently leaked urine? N -  Do you have problems with loss of bowel control? N -  Managing your Medications? Y -  Managing your Finances? Y -  Housekeeping or managing your Housekeeping? Y -  Some recent data might be hidden    Fall/Depression Screening: Fall Risk  07/26/2016 07/15/2016 04/28/2015  Falls in the past year? Yes - No  Number falls in past yr: 2 or more - -  Injury with Fall? Yes - -  Risk Factor Category  High Fall Risk - -  Risk for fall due to : History of fall(s);Impaired balance/gait;Impaired mobility;Impaired vision Impaired balance/gait;Impaired mobility;Impaired vision;Medication side effect -  Follow up Falls prevention discussed - -  PHQ 2/9 Scores 07/26/2016 07/15/2016 04/28/2015 10/03/2014 05/21/2014  PHQ - 2 Score 0 0 0 2 0  PHQ- 9 Score - - - 6 -      Assessment:  Medications were reviewed comparing hospital discharge medication list from 07/14/16, medication list in this chart, and report of patient's medication from her spouse.  Patient was recently discharged from hospital and all medications have been reviewed.  Drugs sorted by  system:  Neurologic/Psychologic: -sertraline   Cardiovascular: -metoprolol tartrate  Gastrointestinal: -pantoprazole  -promethazine prn   Endocrine: -levothyroxine   Pain: -acetaminophen prn  -diclofenac gel prn  Vitamins/Minerals: -multivitamin   Following 07/03/16 discharge, digoxin, lisinopril, and furosemide where discontinued.   Suggest close monitoring of patient's sodium given hyponatremia---sertraline can be associated with hyponatremia.    Patient counseling: Patient's spouse was counseled promethazine was written by prescriber as needed, not scheduled---it was in pill box three times daily.  Patient's spouse did not want promethazine removed from pill box at time of home visit.    Counseled patient's spouse based on promethazine prescription it is only for when patient experiences nausea/vomiting. Patient's spouse was counseled they will need to discuss with PCP ondansetron versus promethazine.     Counseled patient's spouse non-control medications can be disposed of in clear zip-lock bag at local [employer].    Of note---there would be a risk of QT prolongation with concomitant sertraline and ondansetron.    Plan:  Will close pharmacy case.    Patient/spouse deny further pharmacy related questions.   Karrie Meres, PharmD, [place] [phone]

## 2016-07-29 ENCOUNTER — Telehealth: Payer: Self-pay | Admitting: Cardiology

## 2016-07-29 ENCOUNTER — Telehealth: Payer: Self-pay | Admitting: Diagnostic Neuroimaging

## 2016-07-29 ENCOUNTER — Other Ambulatory Visit: Payer: Self-pay

## 2016-07-29 NOTE — Telephone Encounter (Signed)
[facility] called and pt had 60 sec of atrial fib at 200 ms.  Pt was called and felt her heart race briefly.   In the hospital she had SVT at that rate.   Strips will need to be reviewed.

## 2016-07-29 NOTE — Telephone Encounter (Signed)
Patients husband called office in reference to scheduling a follow up visit with Dr. Leta Baptist after she was in ER in April.  No openings soon can patient be worked in?.  Please call

## 2016-07-29 NOTE — Telephone Encounter (Signed)
I can see patient in next 2-4 weeks. -VRP

## 2016-07-30 NOTE — Patient Outreach (Signed)
      This RNCM received call from [place],( [phone])  RN/ [place]. Amy expressed to this RNCM her concerns related to patient's declining health, mental status. Amy also reports concern related to patient's continuous smoking, her husband's medication confusion and frustration with caring for his wife with whom he states his frustrations. Amy and this RNCM collaborated on discussion with primary care provider for this patient.  Amy and this RNCM agreed on the following: Discuss requesting a palliative care consult due to continued smoking, declining health status, Change Phenergan to Zofran Request order for [facility]HSW and bath aide  This RNCM made visit to primary care provider, spoke with NP. NP agreed with suggestions.  This RNCM made call to Amy to update.  Plan: Follow up with telephone call in the next 21 days for assessment in reaching her case management goals.

## 2016-08-01 ENCOUNTER — Telehealth: Payer: Self-pay | Admitting: Cardiology

## 2016-08-01 ENCOUNTER — Ambulatory Visit: Payer: Medicare Other | Admitting: Pulmonary Disease

## 2016-08-01 NOTE — Telephone Encounter (Signed)
Spoke with husband and scheduled patient for hospital FU 08/22/16; advised she arrive 20-30 min early to check in. He verbalized understanding, appreciation.

## 2016-08-01 NOTE — Telephone Encounter (Signed)
[facility], Lelan Pons, called to report that the patient had 90 seconds of rapid aflutter with rates 190-200 bpm. She then returned to sinus rhythm in the 90's. When she called the patient, Ms. Baldi said that she had felt funny but could not describe specific complaints.   I called the patient and her son answered. He said that she had a brief feeling, but is fine now. She is lying down. Advised to call the office id she has any symptoms and speak tot he answering service. He verbalizes understanding.   Daune Perch, AGNP-C 08/01/2016  7:44 PM Pager: [phone]

## 2016-08-02 DIAGNOSIS — G894 Chronic pain syndrome: Secondary | ICD-10-CM | POA: Diagnosis not present

## 2016-08-02 DIAGNOSIS — I5030 Unspecified diastolic (congestive) heart failure: Secondary | ICD-10-CM | POA: Diagnosis not present

## 2016-08-02 DIAGNOSIS — J449 Chronic obstructive pulmonary disease, unspecified: Secondary | ICD-10-CM | POA: Diagnosis not present

## 2016-08-02 DIAGNOSIS — E785 Hyperlipidemia, unspecified: Secondary | ICD-10-CM | POA: Diagnosis not present

## 2016-08-02 DIAGNOSIS — M542 Cervicalgia: Secondary | ICD-10-CM | POA: Diagnosis not present

## 2016-08-02 DIAGNOSIS — I110 Hypertensive heart disease with heart failure: Secondary | ICD-10-CM | POA: Diagnosis not present

## 2016-08-02 NOTE — Telephone Encounter (Signed)
Can we get her in the atrial fibrillation clinic this week since I'm not in the office?

## 2016-08-02 NOTE — Telephone Encounter (Signed)
Called and scheduled appointment with A fib clinic for Thursday 08/04/16 at 2:15. Spoke with patients husband who stated that the patient has been confused "out of it" since she was discharged from the hospital on 07/14/16. Per husband the patient has not had any weakness and PT was out today to see her. Stated stroke had been ruled out in the hospital. Advised husband of appointment scheduled in A fib clinic. He did not know if he would be able to get her to appointment secondary to her current state. Explained the importance of treatment options with Afib and risk of stroke. He stated he would speak with his son and see if they could get patient to appointment. Number given to husband so that he could call Afib clinic for directions and code to get in

## 2016-08-03 ENCOUNTER — Ambulatory Visit
Admission: RE | Admit: 2016-08-03 | Discharge: 2016-08-03 | Disposition: A | Discharge status: Home / Self Care | Payer: Medicare Other | Source: Ambulatory Visit | Attending: Nurse Practitioner | Admitting: Nurse Practitioner

## 2016-08-03 ENCOUNTER — Other Ambulatory Visit: Payer: Self-pay | Admitting: Nurse Practitioner

## 2016-08-03 DIAGNOSIS — J189 Pneumonia, unspecified organism: Secondary | ICD-10-CM

## 2016-08-04 ENCOUNTER — Other Ambulatory Visit: Payer: Self-pay

## 2016-08-04 ENCOUNTER — Telehealth: Payer: Self-pay | Admitting: Cardiovascular Disease

## 2016-08-04 ENCOUNTER — Inpatient Hospital Stay (HOSPITAL_COMMUNITY): Admission: RE | Admit: 2016-08-04 | Payer: Medicare Other | Source: Ambulatory Visit | Admitting: Nurse Practitioner

## 2016-08-04 DIAGNOSIS — I5030 Unspecified diastolic (congestive) heart failure: Secondary | ICD-10-CM | POA: Diagnosis not present

## 2016-08-04 DIAGNOSIS — M542 Cervicalgia: Secondary | ICD-10-CM | POA: Diagnosis not present

## 2016-08-04 DIAGNOSIS — I110 Hypertensive heart disease with heart failure: Secondary | ICD-10-CM | POA: Diagnosis not present

## 2016-08-04 DIAGNOSIS — E785 Hyperlipidemia, unspecified: Secondary | ICD-10-CM | POA: Diagnosis not present

## 2016-08-04 DIAGNOSIS — G894 Chronic pain syndrome: Secondary | ICD-10-CM | POA: Diagnosis not present

## 2016-08-04 DIAGNOSIS — J449 Chronic obstructive pulmonary disease, unspecified: Secondary | ICD-10-CM | POA: Diagnosis not present

## 2016-08-04 NOTE — Telephone Encounter (Signed)
Received event monitor report from [facility]. Contacted Charlotte Sanes, RN. He stated report has been sent to [facility] office already.

## 2016-08-04 NOTE — Telephone Encounter (Signed)
Discussed w Dr. Percival Spanish ([facility]) who advised OK to keep upcoming a fib clinic visit as scheduled.  Faxing copy of telemetry report to attn  Roderic Palau NP

## 2016-08-04 NOTE — Telephone Encounter (Signed)
Pt of Dr. Terese Door to [facility]. Got alert of monitor event They are faxing strips to our clinic office fax #.  SVT occurrence w rate of 200 bpm - 60-90 seconds in duration  Called & spoke w patient, who reports doing physical therapy exercises at the time. only reported symptom was dizziness which resolved after a few minutes.  f/u rhythm 70-90 bpm  Pt has upcoming A Fib clinic appt on 5/22 - I have discussed w Stacy C Rn to let her know of situation.  Once I receive strips, will send copy to A Fib clinic fax [phone]  Routed to [facility] for [facility]

## 2016-08-04 NOTE — Telephone Encounter (Signed)
New message   [facility] is calling about abnormal EKG

## 2016-08-05 DIAGNOSIS — I110 Hypertensive heart disease with heart failure: Secondary | ICD-10-CM | POA: Diagnosis not present

## 2016-08-05 DIAGNOSIS — E785 Hyperlipidemia, unspecified: Secondary | ICD-10-CM | POA: Diagnosis not present

## 2016-08-05 DIAGNOSIS — J449 Chronic obstructive pulmonary disease, unspecified: Secondary | ICD-10-CM | POA: Diagnosis not present

## 2016-08-05 DIAGNOSIS — M542 Cervicalgia: Secondary | ICD-10-CM | POA: Diagnosis not present

## 2016-08-05 DIAGNOSIS — G894 Chronic pain syndrome: Secondary | ICD-10-CM | POA: Diagnosis not present

## 2016-08-05 DIAGNOSIS — I5030 Unspecified diastolic (congestive) heart failure: Secondary | ICD-10-CM | POA: Diagnosis not present

## 2016-08-05 NOTE — Patient Outreach (Signed)
[facility]) Care Management  08/05/2016   Destiny Hughes 08/18/1945 [id number]  Subjective:  [facility] RNCM spoke with patient who states patient has been getting better at times but he is exhausted.  Objective:  Telephonic encounter  Current Medications:  Current Outpatient Prescriptions  Medication Sig Dispense Refill  . acetaminophen (TYLENOL) 325 MG tablet Take 2 tablets (650 mg total) by mouth every 6 (six) hours as needed for headache (pain). (Patient not taking: Reported on 07/29/2016)    . diclofenac sodium (VOLTAREN) 1 % GEL Apply 2 g topically 4 (four) times daily as needed. To right chest area for pain 100 g 0  . levothyroxine (SYNTHROID, LEVOTHROID) 150 MCG tablet TAKE 1 TO 1+1/2 TABLETS DAILY AS DIRECTED (Patient taking differently: Take 1 1/2 tablet (225 mcg) by mouth on Tuesday, Thursday before breakfast, take 1 tablet (150 mcg) before breakfast on Sunday, Monday, Wednesday, Friday, Saturday) 135 tablet 1  . metoprolol tartrate (LOPRESSOR) 25 MG tablet Take 1 tablet (25 mg total) by mouth 3 (three) times daily. 90 tablet 0  . Multiple Vitamin (MULTIVITAMIN WITH MINERALS) TABS tablet Take 1 tablet by mouth daily. Women's Alive Gummies    . pantoprazole (PROTONIX) 40 MG tablet Take 1 tablet (40 mg total) by mouth 2 (two) times daily. 60 tablet 0  . promethazine (PHENERGAN) 25 MG tablet Take 25 mg by mouth every 6 (six) hours as needed for nausea or vomiting.    . sertraline (ZOLOFT) 100 MG tablet Take 100 mg by mouth at bedtime.     No current facility-administered medications for this visit.     Functional Status:  In your present state of health, do you have any difficulty performing the following activities: 07/15/2016 07/08/2016  Hearing? N N  Vision? Y N  Difficulty concentrating or making decisions? Y N  Walking or climbing stairs? Y N  Dressing or bathing? Y N  Doing errands, shopping? Y N  Preparing Food and eating ? N -  Using the Toilet? N -   In the past six months, have you accidently leaked urine? N -  Do you have problems with loss of bowel control? N -  Managing your Medications? Y -  Managing your Finances? Y -  Housekeeping or managing your Housekeeping? Y -  Some recent data might be hidden    Fall/Depression Screening: Fall Risk  07/26/2016 07/15/2016 04/28/2015  Falls in the past year? Yes - No  Number falls in past yr: 2 or more - -  Injury with Fall? Yes - -  Risk Factor Category  High Fall Risk - -  Risk for fall due to : History of fall(s);Impaired balance/gait;Impaired mobility;Impaired vision Impaired balance/gait;Impaired mobility;Impaired vision;Medication side effect -  Follow up Falls prevention discussed - -   PHQ 2/9 Scores 07/26/2016 07/15/2016 04/28/2015 10/03/2014 05/21/2014  PHQ - 2 Score 0 0 0 2 0  PHQ- 9 Score - - - 6 -   [facility] CM Care Plan Problem One     Most Recent Value  Care Plan Problem One  Patient recently discharged from the hospital  Role Documenting the Problem One  Care Management [place] for Problem One  Active  [facility]N Long Term Goal (31-90 days)  Patient will have no acute care admissions in the next 31 days.  [facility]N Long Term Goal Start Date  07/15/16  Interventions for Problem One Long Term Goal  telephone contact with patient's husband who advises patient continues to have difficult  days but is having some good days.  Husband also reports he is exhausted from providing care.    [facility] CM Short Term Goal #1 (0-30 days)  In the next 28 days, patient will meet with the [facility] RNCM  for assessment of community to assess her need for community resources  [facility] CM Short Term Goal #1 Start Date  07/15/16  Interventions for Short Term Goal #1  Community resourcess for respite and long term care disucssed with patient's husband.  Patient agrees to receive information on area facilities.      [facility] CM Care Plan Problem Two     Most Recent Value  Care Plan Problem Two  patient and caregiver will meet  with [facility] pharmacist for medicaiton education in the next 28 days   Role Documenting the Problem Two  Care Management Coordinator     Assessment:  Patinet's husband spoke with this RNCM during this telephonic encounter. Patient's husband states caring for patient is causing exhaustion. This RNCM gave patient's husband information on area long term care facilities.  Husband requested information on those within a ten mile racius. Patient's husband advised to look up area facilities online for further information. Husband agreed that he and his wife will meet with this RCNM within the next 14 days for further discussion of placement and the process for placement.   Plan:    Home visit in the next 21 days for assessment of need for long term care placement

## 2016-08-08 DIAGNOSIS — G894 Chronic pain syndrome: Secondary | ICD-10-CM | POA: Diagnosis not present

## 2016-08-08 DIAGNOSIS — J449 Chronic obstructive pulmonary disease, unspecified: Secondary | ICD-10-CM | POA: Diagnosis not present

## 2016-08-08 DIAGNOSIS — M542 Cervicalgia: Secondary | ICD-10-CM | POA: Diagnosis not present

## 2016-08-08 DIAGNOSIS — E785 Hyperlipidemia, unspecified: Secondary | ICD-10-CM | POA: Diagnosis not present

## 2016-08-08 DIAGNOSIS — I110 Hypertensive heart disease with heart failure: Secondary | ICD-10-CM | POA: Diagnosis not present

## 2016-08-08 DIAGNOSIS — I5030 Unspecified diastolic (congestive) heart failure: Secondary | ICD-10-CM | POA: Diagnosis not present

## 2016-08-09 ENCOUNTER — Encounter (HOSPITAL_COMMUNITY): Payer: Self-pay | Admitting: Nurse Practitioner

## 2016-08-09 ENCOUNTER — Ambulatory Visit (HOSPITAL_COMMUNITY)
Admission: RE | Admit: 2016-08-09 | Discharge: 2016-08-09 | Disposition: A | Discharge status: Home / Self Care | Payer: Medicare Other | Source: Ambulatory Visit | Attending: Nurse Practitioner | Admitting: Nurse Practitioner

## 2016-08-09 VITALS — BP 134/82 | HR 65 | Ht 66.0 in | Wt 130.6 lb

## 2016-08-09 DIAGNOSIS — Z7901 Long term (current) use of anticoagulants: Secondary | ICD-10-CM | POA: Insufficient documentation

## 2016-08-09 DIAGNOSIS — I480 Paroxysmal atrial fibrillation: Secondary | ICD-10-CM | POA: Diagnosis not present

## 2016-08-09 DIAGNOSIS — K219 Gastro-esophageal reflux disease without esophagitis: Secondary | ICD-10-CM | POA: Insufficient documentation

## 2016-08-09 DIAGNOSIS — E785 Hyperlipidemia, unspecified: Secondary | ICD-10-CM | POA: Insufficient documentation

## 2016-08-09 DIAGNOSIS — I4891 Unspecified atrial fibrillation: Secondary | ICD-10-CM | POA: Diagnosis present

## 2016-08-09 DIAGNOSIS — G894 Chronic pain syndrome: Secondary | ICD-10-CM | POA: Diagnosis not present

## 2016-08-09 DIAGNOSIS — F329 Major depressive disorder, single episode, unspecified: Secondary | ICD-10-CM | POA: Insufficient documentation

## 2016-08-09 DIAGNOSIS — I444 Left anterior fascicular block: Secondary | ICD-10-CM | POA: Insufficient documentation

## 2016-08-09 DIAGNOSIS — F17210 Nicotine dependence, cigarettes, uncomplicated: Secondary | ICD-10-CM | POA: Diagnosis not present

## 2016-08-09 DIAGNOSIS — E039 Hypothyroidism, unspecified: Secondary | ICD-10-CM | POA: Diagnosis not present

## 2016-08-09 DIAGNOSIS — J449 Chronic obstructive pulmonary disease, unspecified: Secondary | ICD-10-CM | POA: Diagnosis not present

## 2016-08-09 DIAGNOSIS — Z79899 Other long term (current) drug therapy: Secondary | ICD-10-CM | POA: Diagnosis not present

## 2016-08-09 MED ORDER — APIXABAN 5 MG PO TABS: 5.0000 mg | ORAL_TABLET | Freq: Two times a day (BID) | ORAL | 6 refills | Status: DC

## 2016-08-09 MED ORDER — APIXABAN 5 MG PO TABS: 5.0000 mg | ORAL_TABLET | Freq: Two times a day (BID) | ORAL | 0 refills | Status: DC

## 2016-08-09 MED ORDER — METOPROLOL SUCCINATE ER 50 MG PO TB24: 50.0000 mg | ORAL_TABLET | Freq: Two times a day (BID) | ORAL | 6 refills | Status: DC

## 2016-08-09 NOTE — Patient Instructions (Addendum)
Your physician has recommended you make the following change in your medication:  1)Start Eliquis 5mg  twice a day  2)Stop Metoprolol tartrate (lopressor)  3)Start Metoprolol succinate (toprol) 50mg  twice a day (take with food)  Let us know if her Heart rate (pulse) is staying less than 50 and if her top number of blood pressure is less than 100.  Melissa will be in touch with you to schedule appointment with Dr. Curt Bears

## 2016-08-10 DIAGNOSIS — J449 Chronic obstructive pulmonary disease, unspecified: Secondary | ICD-10-CM | POA: Diagnosis not present

## 2016-08-10 DIAGNOSIS — G894 Chronic pain syndrome: Secondary | ICD-10-CM | POA: Diagnosis not present

## 2016-08-10 DIAGNOSIS — I110 Hypertensive heart disease with heart failure: Secondary | ICD-10-CM | POA: Diagnosis not present

## 2016-08-10 DIAGNOSIS — E785 Hyperlipidemia, unspecified: Secondary | ICD-10-CM | POA: Diagnosis not present

## 2016-08-10 DIAGNOSIS — M542 Cervicalgia: Secondary | ICD-10-CM | POA: Diagnosis not present

## 2016-08-10 DIAGNOSIS — I5030 Unspecified diastolic (congestive) heart failure: Secondary | ICD-10-CM | POA: Diagnosis not present

## 2016-08-10 NOTE — Progress Notes (Signed)
Primary Care Physician: Minette Brine Referring Physician: [facility] ER f/u EP: Dr. Otilio Connors is a 71 y.o. female with a h/o  COPD, ongoing tobacco use, chronic pain syndrome-previously on narcotics (discontinued 3 weeks back by her primary pain management M.D.)-recently admitted from 4/13-4/15 for bradycardia, acute kidney injury and subsequently discharged home-brought back to the ED for evaluation of fall, generalized weakness, and poor appetite and readmitted. Hyponatremias present as well.. Apparently, approximately a month and a half back-patient had cataract surgery, following which per family patient started to get confused-especially with short-term memory loss. She then subsequently developed a poor appetite, nausea and intermittent vomiting. This was subsequently accompanied by mostly upper abdominal pain. She was noted to have episodes of "tacycardia" with rvr and event monitor was placed.  Pt is in the afib clinic for monitor showing episodes of RVR probable afib with a fairly regular v response or a 2:1atrial flutter. Family(son/husband) states that physically pt is improved form last hospitalization, no further falls. She is having memory issues and is pending appointment with neuro. Husband pours and supervises meds. She is off pain meds. Pt will answer some direct questions but most of interview is with son and husband. Pt is nodding off frequently during the visit. Bleeding history is denied. Chadsvasc score is at least 2.  Today, she denies symptoms of palpitations, chest pain, shortness of breath, orthopnea, PND, lower extremity edema, dizziness, presyncope, syncope, or neurologic sequela. The patient is tolerating medications without difficulties and is otherwise without complaint today.   Past Medical History:  Diagnosis Date  . Arthritis 01-25-11   back and most joints  . Blood transfusion    after Thoracic surgery ?'04  . Chronic back pain   . Chronic neck  pain   . Collapse of right lung   . COPD (chronic obstructive pulmonary disease) (HCC)    Hx. Bronchitis occ, 01-25-11 somes issue now taking  Z-pak-started 01-24-11/Scar tissue  present  from previous lung collapse  . Depression   . Dyspnea   . Dysrhythmia    hx.PAT- tx. Inderal  . Emphysema of lung ([place])   . GERD (gastroesophageal reflux disease)    tx. TUMS  . Glaucoma   . Headache(784.0) 01-25-11   neck and nerve pain related.  . Hyperlipemia   . Hypothyroidism    tx. Levothyroxine  . Nephrolithiasis   . Neuromuscular disorder ([place]) 01-25-11   Pain/nerve stimulator implanted -2 yrs ago.  . Palpitation   . Tachycardia    Past Surgical History:  Procedure Laterality Date  . APPENDECTOMY  01-25-11  . CARPAL TUNNEL RELEASE  01-25-11   Bil.  . CERVICAL SPINE SURGERY  01-25-11   2005-multiple levels  . CHOLECYSTECTOMY  01-25-11   '07  . COLONOSCOPY WITH PROPOFOL N/A 07/23/2012   Procedure: COLONOSCOPY WITH PROPOFOL;  Surgeon: Garlan Fair, MD;  Location: [facility] ENDOSCOPY;  Service: Endoscopy;  Laterality: N/A;  . EYE SURGERY    . JOINT REPLACEMENT  01-25-11   6'03 LTHA-hemi  . KNEE ARTHROPLASTY  01-25-11   '04-right, revised x2  . SPINAL CORD STIMULATOR IMPLANT  2009 APPROX   DR. ELSNER  . THORACIC SPINE SURGERY  01-25-11   6'02- then nerve stimulator implanted after  . TOTAL KNEE REVISION  02/01/2011   Procedure: TOTAL KNEE REVISION;  Surgeon: Mauri Pole;  Location: [facility] ORS;  Service: Orthopedics;  Laterality: Right;  . TUBAL LIGATION      Current Outpatient Prescriptions  Medication Sig Dispense Refill  . acetaminophen (TYLENOL) 325 MG tablet Take 2 tablets (650 mg total) by mouth every 6 (six) hours as needed for headache (pain).    Marland Kitchen diclofenac sodium (VOLTAREN) 1 % GEL Apply 2 g topically 4 (four) times daily as needed. To right chest area for pain 100 g 0  . levothyroxine (SYNTHROID, LEVOTHROID) 150 MCG tablet TAKE 1 TO 1+1/2 TABLETS DAILY AS DIRECTED (Patient taking  differently: Take 1 1/2 tablet (225 mcg) by mouth on Tuesday, Thursday before breakfast, take 1 tablet (150 mcg) before breakfast on Sunday, Monday, Wednesday, Friday, Saturday) 135 tablet 1  . Multiple Vitamin (MULTIVITAMIN WITH MINERALS) TABS tablet Take 1 tablet by mouth daily. Women's Alive Gummies    . pantoprazole (PROTONIX) 40 MG tablet Take 1 tablet (40 mg total) by mouth 2 (two) times daily. 60 tablet 0  . promethazine (PHENERGAN) 25 MG tablet Take 25 mg by mouth every 6 (six) hours as needed for nausea or vomiting.    . sertraline (ZOLOFT) 100 MG tablet Take 100 mg by mouth at bedtime.    Marland Kitchen apixaban (ELIQUIS) 5 MG TABS tablet Take 1 tablet (5 mg total) by mouth 2 (two) times daily. 60 tablet 6  . metoprolol succinate (TOPROL XL) 50 MG 24 hr tablet Take 1 tablet (50 mg total) by mouth 2 (two) times daily. Take with or immediately following a meal. 60 tablet 6   No current facility-administered medications for this encounter.     Allergies  Allergen Reactions  . Albuterol Other (See Comments)    Had difficulty breathing after a nebulizer treatment  . Penicillins Anaphylaxis and Swelling    Has patient had a PCN reaction causing immediate rash, facial/tongue/throat swelling, SOB or lightheadedness with hypotension: Yes Has patient had a PCN reaction causing severe rash involving mucus membranes or skin necrosis: Rash Has patient had a PCN reaction that required hospitalization: No Has patient had a PCN reaction occurring within the last 10 years: No If all of the above answers are "NO", then may proceed with Cephalosporin use.  [name] [Kdc:Albuterol] Shortness Of Breath  . Sulfa Antibiotics Swelling    Throat swells, but no shortness of breath noted  . Zanaflex [Tizanidine] Other (See Comments)    Possible confusion (??)  . Codeine Nausea And Vomiting  . Ecotrin [Aspirin] Nausea And Vomiting    Social History   Social History  . Marital status: Married    Spouse name: N/A   . Number of children: 2  . Years of education: N/A   Occupational History  . disabled    Social History Main Topics  . Smoking status: Current Every Day Smoker    Packs/day: 0.75    Years: 50.00    Types: Cigarettes    Start date: 09/13/1961  . Smokeless tobacco: Never Used     Comment: Peak rate of 1ppd  . Alcohol use No     Comment: none in 10 years  . Drug use: No  . Sexual activity: No   Other Topics Concern  . Not on file   Social History Narrative   She is from [place] [facility] [place]. She has always lived in [place]. She has traveled to [place], [place], [place], [place], & [place]. 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 Prior exposure to parakeets with last exposure remote in her current home. Previously had mold  in her home that was in her bathroom & fixed. Prior exposure to hot tubs but none recently. Pt lives at home with [name], husband.  Has 2 childrean, 12th grade education.      Family History  Problem Relation Age of Onset  . Heart attack Father   . Emphysema Father   . Aneurysm Mother        CEREBRAL  . Emphysema Mother   . Dementia Mother   . Diabetes Son   . Hypertension Son   . Hypertension Son   . Cancer Sister        Widely Metastatic  . Asthma Son        x2  . Breast cancer Paternal Aunt   . Rheum arthritis Maternal Aunt     ROS- All systems are reviewed and negative except as per the HPI above  Physical Exam: Vitals:   08/09/16 1445  BP: 134/82  Pulse: 65  Weight: 130 lb 9.6 oz (59.2 kg)  Height: 5\' 6"  (1.676 m)   Wt Readings from Last 3 Encounters:  08/09/16 130 lb 9.6 oz (59.2 kg)  07/14/16 136 lb (61.7 kg)  07/03/16 138 lb (62.6 kg)    Labs: Lab Results  Component Value Date   NA 129 (L) 07/13/2016   K 3.9 07/13/2016   CL 94 (L) 07/13/2016   CO2 28 07/13/2016   GLUCOSE 111 (H) 07/13/2016   BUN 7 07/13/2016   CREATININE 0.68 07/13/2016   CALCIUM  9.0 07/13/2016   PHOS 5.7 (H) 07/01/2016   MG 1.5 (L) 07/12/2016   Lab Results  Component Value Date   INR 1.04 07/01/2016   Lab Results  Component Value Date   CHOL 195 04/28/2015   HDL 43 (L) 04/28/2015   LDLCALC 114 04/28/2015   TRIG 188 (H) 04/28/2015     GEN- The patient is well appearing, alert and oriented x 3 today.   Head- normocephalic, atraumatic Eyes-  Sclera clear, conjunctiva pink Ears- hearing intact Oropharynx- clear Neck- supple, no JVP Lymph- no cervical lymphadenopathy Lungs- Clear to ausculation bilaterally, normal work of breathing Heart- Regular rate and rhythm, no murmurs, rubs or gallops, PMI not laterally displaced GI- soft, NT, ND, + BS Extremities- no clubbing, cyanosis, or edema MS- no significant deformity or atrophy Skin- no rash or lesion Psych- euthymic mood, full affect Neuro- strength and sensation are intact  EKG-NSR with LAFB pr int 188 ms qrs 86 ms, qtc 434 ms Epic records reviewed Event monitor reviewed Echo-Study Conclusions  - Left ventricle: The cavity size was normal. Wall thickness was   normal. Systolic function was normal. The estimated ejection   fraction was in the range of 60% to 65%. Wall motion was normal;   there were no regional wall motion abnormalities. Doppler   parameters are consistent with pseudonormal left ventricular   relaxation (grade 2 diastolic dysfunction). The E/e&' ratio is   >15, suggesting elevated LV filling pressure. - Mitral valve: Mildly thickened leaflets . There was mild   regurgitation. - Left atrium: The atrium was normal in size. - Right ventricle: The cavity size was mildly dilated. Systolic   function is reduced. - Right atrium: The atrium was mildly dilated. - Inferior vena cava: The vessel was dilated. The respirophasic   diameter changes were blunted (< 50%), consistent with elevated   central venous pressure.  Impressions:  - LVEF 60-65%, normal wall thickness, normal wall  motion, stage 2   DD with elevated LV filling  pressure, mild MR, normal LA size,   mild RAE, dilated RV with reduced systolic function, dilated IVC,   no TR.    Assessment and Plan: 1. afib with rvr (up to 160-190 bpm, non sustained), vrs aflutter 2;1 conduction Discussed with Dr. Curt Bears Will start eliquis 5 mg bid for chadsvasc score of at least 2 Change lopressor to toprol xl 50 mg bid Denies bleeding history Fall risk discussed, one recent fall prior to last hospitalization bu no recent Bleeding precautions discussed  F/u with Dr. Gaynelle Cage C. [place], [facility] [address] [place], [place] [address] [phone]

## 2016-08-11 ENCOUNTER — Ambulatory Visit: Payer: Self-pay

## 2016-08-13 ENCOUNTER — Telehealth: Payer: Self-pay | Admitting: Cardiology

## 2016-08-13 NOTE — Telephone Encounter (Signed)
[name] son called for due to rapid HR of 200 and elevated BP.  She has hx PAF or flutter, she is on eliquis and lopressor was changed to toprol XL 50 mg BID  She will take extra half if needed and this episode only lasted 5 min.  If last longer or pain then come to ER or take extra half of toprol.  Son understands.

## 2016-08-14 ENCOUNTER — Telehealth: Payer: Self-pay | Admitting: Cardiology

## 2016-08-14 NOTE — Telephone Encounter (Signed)
[name] family called twice earlier today with pt's HR elevated.  She had taken an extra 25 mg of toprol without change, discussed with Dr. Rayann Heman, pt on eliquis, if hR continues to be elevated take another half tab of toprol in 30 min.  They did call back about 2 hours later with continued elevated HR.  She was asymptomatic.  Discussed options of waiting til AM if still raqid HR to cal back, though if becomes symptomatic to come to ER.     I will send message to Roderic Palau to see pt Tuesday or at least this week.  Family was agreeable to this plan but if concerned they will present to ER.

## 2016-08-15 ENCOUNTER — Emergency Department (HOSPITAL_COMMUNITY): Payer: Medicare Other

## 2016-08-15 ENCOUNTER — Encounter (HOSPITAL_COMMUNITY): Payer: Self-pay | Admitting: *Deleted

## 2016-08-15 ENCOUNTER — Inpatient Hospital Stay (HOSPITAL_COMMUNITY)
Admission: EM | Admit: 2016-08-15 | Discharge: 2016-08-17 | DRG: 274 | Disposition: A | Discharge status: Home / Self Care | Payer: Medicare Other | Attending: Internal Medicine | Admitting: Internal Medicine

## 2016-08-15 DIAGNOSIS — H409 Unspecified glaucoma: Secondary | ICD-10-CM | POA: Diagnosis present

## 2016-08-15 DIAGNOSIS — I4892 Unspecified atrial flutter: Secondary | ICD-10-CM | POA: Diagnosis not present

## 2016-08-15 DIAGNOSIS — J449 Chronic obstructive pulmonary disease, unspecified: Secondary | ICD-10-CM | POA: Diagnosis not present

## 2016-08-15 DIAGNOSIS — I4891 Unspecified atrial fibrillation: Secondary | ICD-10-CM | POA: Diagnosis not present

## 2016-08-15 DIAGNOSIS — I440 Atrioventricular block, first degree: Secondary | ICD-10-CM | POA: Diagnosis present

## 2016-08-15 DIAGNOSIS — Z79899 Other long term (current) drug therapy: Secondary | ICD-10-CM | POA: Diagnosis not present

## 2016-08-15 DIAGNOSIS — F329 Major depressive disorder, single episode, unspecified: Secondary | ICD-10-CM | POA: Diagnosis present

## 2016-08-15 DIAGNOSIS — Z888 Allergy status to other drugs, medicaments and biological substances status: Secondary | ICD-10-CM

## 2016-08-15 DIAGNOSIS — I442 Atrioventricular block, complete: Secondary | ICD-10-CM | POA: Diagnosis not present

## 2016-08-15 DIAGNOSIS — I483 Typical atrial flutter: Secondary | ICD-10-CM | POA: Diagnosis not present

## 2016-08-15 DIAGNOSIS — I471 Supraventricular tachycardia: Secondary | ICD-10-CM | POA: Diagnosis present

## 2016-08-15 DIAGNOSIS — I484 Atypical atrial flutter: Secondary | ICD-10-CM

## 2016-08-15 DIAGNOSIS — E785 Hyperlipidemia, unspecified: Secondary | ICD-10-CM | POA: Diagnosis present

## 2016-08-15 DIAGNOSIS — Z880 Allergy status to penicillin: Secondary | ICD-10-CM | POA: Diagnosis not present

## 2016-08-15 DIAGNOSIS — Z7901 Long term (current) use of anticoagulants: Secondary | ICD-10-CM | POA: Diagnosis not present

## 2016-08-15 DIAGNOSIS — I110 Hypertensive heart disease with heart failure: Secondary | ICD-10-CM | POA: Diagnosis not present

## 2016-08-15 DIAGNOSIS — F17210 Nicotine dependence, cigarettes, uncomplicated: Secondary | ICD-10-CM | POA: Diagnosis present

## 2016-08-15 DIAGNOSIS — Z96642 Presence of left artificial hip joint: Secondary | ICD-10-CM | POA: Diagnosis present

## 2016-08-15 DIAGNOSIS — E039 Hypothyroidism, unspecified: Secondary | ICD-10-CM | POA: Diagnosis present

## 2016-08-15 DIAGNOSIS — R000 Tachycardia, unspecified: Secondary | ICD-10-CM | POA: Diagnosis not present

## 2016-08-15 DIAGNOSIS — M542 Cervicalgia: Secondary | ICD-10-CM | POA: Diagnosis not present

## 2016-08-15 DIAGNOSIS — J439 Emphysema, unspecified: Secondary | ICD-10-CM | POA: Diagnosis present

## 2016-08-15 DIAGNOSIS — I5030 Unspecified diastolic (congestive) heart failure: Secondary | ICD-10-CM | POA: Diagnosis not present

## 2016-08-15 DIAGNOSIS — Z96651 Presence of right artificial knee joint: Secondary | ICD-10-CM | POA: Diagnosis present

## 2016-08-15 DIAGNOSIS — I480 Paroxysmal atrial fibrillation: Secondary | ICD-10-CM | POA: Diagnosis present

## 2016-08-15 DIAGNOSIS — G894 Chronic pain syndrome: Secondary | ICD-10-CM | POA: Diagnosis present

## 2016-08-15 DIAGNOSIS — K219 Gastro-esophageal reflux disease without esophagitis: Secondary | ICD-10-CM | POA: Diagnosis present

## 2016-08-15 DIAGNOSIS — I10 Essential (primary) hypertension: Secondary | ICD-10-CM | POA: Diagnosis present

## 2016-08-15 DIAGNOSIS — R002 Palpitations: Secondary | ICD-10-CM | POA: Diagnosis not present

## 2016-08-15 LAB — CBC
HCT: 42.7 % (ref 36.0–46.0)
Hemoglobin: 15 g/dL (ref 12.0–15.0)
MCH: 30.2 pg (ref 26.0–34.0)
MCHC: 35.1 g/dL (ref 30.0–36.0)
MCV: 86.1 fL (ref 78.0–100.0)
Platelets: 337 10*3/uL (ref 150–400)
RBC: 4.96 MIL/uL (ref 3.87–5.11)
RDW: 14.8 % (ref 11.5–15.5)
WBC: 8.8 10*3/uL (ref 4.0–10.5)

## 2016-08-15 LAB — BASIC METABOLIC PANEL
Anion gap: 10 (ref 5–15)
BUN: 12 mg/dL (ref 6–20)
CO2: 25 mmol/L (ref 22–32)
Calcium: 9.4 mg/dL (ref 8.9–10.3)
Chloride: 100 mmol/L — ABNORMAL LOW (ref 101–111)
Creatinine, Ser: 0.89 mg/dL (ref 0.44–1.00)
GFR calc Af Amer: >60 mL/min (ref >60)
GFR calc non Af Amer: >60 mL/min (ref >60)
Glucose, Bld: 123 mg/dL — ABNORMAL HIGH (ref 65–99)
Potassium: 3.5 mmol/L (ref 3.5–5.1)
Sodium: 135 mmol/L (ref 135–145)

## 2016-08-15 LAB — I-STAT TROPONIN, ED: Troponin i, poc: 0.05 ng/mL (ref 0.00–0.08)

## 2016-08-15 MED ORDER — LEVOTHYROXINE SODIUM 75 MCG PO TABS: 150.0000 ug | ORAL_TABLET | ORAL | Status: DC

## 2016-08-15 MED ORDER — DILTIAZEM LOAD VIA INFUSION
15.0000 mg | Freq: Once | INTRAVENOUS | Status: AC
  Administered 2016-08-15: 15 mg via INTRAVENOUS
  Filled 2016-08-15: qty 15

## 2016-08-15 MED ORDER — APIXABAN 5 MG PO TABS: 5.0000 mg | ORAL_TABLET | Freq: Two times a day (BID) | ORAL | Status: DC

## 2016-08-15 MED ORDER — DILTIAZEM HCL 100 MG IV SOLR
5.0000 mg/h | INTRAVENOUS | Status: DC
  Administered 2016-08-15: 5 mg/h via INTRAVENOUS
  Filled 2016-08-15: qty 100

## 2016-08-15 MED ORDER — SODIUM CHLORIDE 0.9% FLUSH
3.0000 mL | Freq: Two times a day (BID) | INTRAVENOUS | Status: DC
  Administered 2016-08-15 – 2016-08-16 (×2): 3 mL via INTRAVENOUS

## 2016-08-15 MED ORDER — MAGNESIUM SULFATE 2 GM/50ML IV SOLN
2.0000 g | Freq: Once | INTRAVENOUS | Status: AC
  Administered 2016-08-15: 2 g via INTRAVENOUS
  Filled 2016-08-15: qty 50

## 2016-08-15 MED ORDER — HEPARIN (PORCINE) IN NACL 100-0.45 UNIT/ML-% IJ SOLN
950.0000 [IU]/h | INTRAMUSCULAR | Status: DC
  Administered 2016-08-15: 850 [IU]/h via INTRAVENOUS
  Filled 2016-08-15: qty 250

## 2016-08-15 MED ORDER — SODIUM CHLORIDE 0.9% FLUSH: 3.0000 mL | INTRAVENOUS | Status: DC | PRN

## 2016-08-15 MED ORDER — LEVOTHYROXINE SODIUM 75 MCG PO TABS
225.0000 ug | ORAL_TABLET | ORAL | Status: DC
  Administered 2016-08-17: 225 ug via ORAL
  Filled 2016-08-15: qty 3

## 2016-08-15 MED ORDER — ADULT MULTIVITAMIN W/MINERALS CH: 1.0000 | ORAL_TABLET | Freq: Every day | ORAL | Status: DC

## 2016-08-15 MED ORDER — PROMETHAZINE HCL 25 MG PO TABS
25.0000 mg | ORAL_TABLET | Freq: Four times a day (QID) | ORAL | Status: DC | PRN
  Administered 2016-08-15: 25 mg via ORAL
  Filled 2016-08-15: qty 1

## 2016-08-15 MED ORDER — PANTOPRAZOLE SODIUM 40 MG PO TBEC: 40.0000 mg | DELAYED_RELEASE_TABLET | Freq: Two times a day (BID) | ORAL | Status: DC

## 2016-08-15 MED ORDER — SERTRALINE HCL 100 MG PO TABS
100.0000 mg | ORAL_TABLET | Freq: Every day | ORAL | Status: DC
  Administered 2016-08-15 – 2016-08-16 (×2): 100 mg via ORAL
  Filled 2016-08-15 (×2): qty 1

## 2016-08-15 MED ORDER — ACETAMINOPHEN 325 MG PO TABS
650.0000 mg | ORAL_TABLET | Freq: Four times a day (QID) | ORAL | Status: DC | PRN
  Administered 2016-08-15: 650 mg via ORAL
  Filled 2016-08-15: qty 2

## 2016-08-15 MED ORDER — ACETAMINOPHEN 325 MG PO TABS: 650.0000 mg | ORAL_TABLET | Freq: Four times a day (QID) | ORAL | Status: DC | PRN

## 2016-08-15 MED ORDER — SERTRALINE HCL 100 MG PO TABS: 100.0000 mg | ORAL_TABLET | Freq: Every day | ORAL | Status: DC

## 2016-08-15 MED ORDER — METOPROLOL TARTRATE 5 MG/5ML IV SOLN
5.0000 mg | Freq: Once | INTRAVENOUS | Status: AC
  Administered 2016-08-15: 5 mg via INTRAVENOUS
  Filled 2016-08-15: qty 5

## 2016-08-15 MED ORDER — LEVOTHYROXINE SODIUM 75 MCG PO TABS: 225.0000 ug | ORAL_TABLET | ORAL | Status: DC

## 2016-08-15 MED ORDER — ADULT MULTIVITAMIN W/MINERALS CH
1.0000 | ORAL_TABLET | Freq: Every day | ORAL | Status: DC
  Administered 2016-08-17: 1 via ORAL
  Filled 2016-08-15: qty 1

## 2016-08-15 MED ORDER — SODIUM CHLORIDE 0.9 % IV SOLN: 250.0000 mL | INTRAVENOUS | Status: DC | PRN

## 2016-08-15 MED ORDER — SODIUM CHLORIDE 0.9 % IV SOLN
INTRAVENOUS | Status: DC
  Administered 2016-08-15 – 2016-08-17 (×3): via INTRAVENOUS

## 2016-08-15 MED ORDER — ONDANSETRON HCL 4 MG/2ML IJ SOLN: 4.0000 mg | Freq: Four times a day (QID) | INTRAMUSCULAR | Status: DC | PRN

## 2016-08-15 MED ORDER — METOPROLOL SUCCINATE ER 50 MG PO TB24: 50.0000 mg | ORAL_TABLET | Freq: Two times a day (BID) | ORAL | Status: DC

## 2016-08-15 MED ORDER — PANTOPRAZOLE SODIUM 40 MG PO TBEC
40.0000 mg | DELAYED_RELEASE_TABLET | Freq: Two times a day (BID) | ORAL | Status: DC
  Administered 2016-08-15 – 2016-08-17 (×4): 40 mg via ORAL
  Filled 2016-08-15 (×4): qty 1

## 2016-08-15 NOTE — ED Triage Notes (Signed)
Pt reports fast heart rate onset x 2-3 days with her family checking her HR at  Home, pts family reports HR 205 on Saturday, pt denies pain & SOB, hx of fast heart beat, pt A&O x4

## 2016-08-15 NOTE — H&P (Signed)
ADMISSION HISTORY & PHYSICAL  Patient Name: Destiny Hughes Date of Encounter: 08/15/2016 Primary Care Physician: Minette Brine Cardiologist: Dr. [facility] Problem List   Principal Problem:   Atrial flutter [facility])    Chief Complaint    Palpitations, weakness, fatigue  HPI  This is a 71 y.o. female with a past medical history significant for COPD, tobacco abuse, chronic pain syndrome (previously on narcotics) and admitted 4/13-15 for bradycardia - recently seen in the a-fib clinic by Roderic Palau, NP for atrial flutter with RVR at 2:1. CHADSVASC score felt to be at least 2. She had an echo and myoview in 08/2015 by Dr. Oval Linsey showing normal LVEF and no ischemia, normal LA size and mild RAE. She was advised after discussion with Dr. Curt Bears, by Butch Penny, to change to stop digoxin and changed lopressor to Toprol XL 50 mg BID. She was started on Eliquis on 08/10/16.  Phone calls over the weekend indicate that her HR has been persistently high, up to 200 and she feels very fatigued with this, so her family brought her into the ER.  PMHx   Past Medical History:  Diagnosis Date  . Arthritis 01-25-11   back and most joints  . Blood transfusion    after Thoracic surgery ?'04  . Chronic back pain   . Chronic neck pain   . Collapse of right lung   . COPD (chronic obstructive pulmonary disease) (HCC)    Hx. Bronchitis occ, 01-25-11 somes issue now taking  Z-pak-started 01-24-11/Scar tissue  present  from previous lung collapse  . Depression   . Dyspnea   . Dysrhythmia    hx.PAT- tx. Inderal  . Emphysema of lung ([place])   . GERD (gastroesophageal reflux disease)    tx. TUMS  . Glaucoma   . Headache(784.0) 01-25-11   neck and nerve pain related.  . Hyperlipemia   . Hypothyroidism    tx. Levothyroxine  . Nephrolithiasis   . Neuromuscular disorder ([place]) 01-25-11   Pain/nerve stimulator implanted -2 yrs ago.  . Palpitation   . Tachycardia     Past Surgical History:    Procedure Laterality Date  . APPENDECTOMY  01-25-11  . CARPAL TUNNEL RELEASE  01-25-11   Bil.  . CERVICAL SPINE SURGERY  01-25-11   2005-multiple levels  . CHOLECYSTECTOMY  01-25-11   '07  . COLONOSCOPY WITH PROPOFOL N/A 07/23/2012   Procedure: COLONOSCOPY WITH PROPOFOL;  Surgeon: Garlan Fair, MD;  Location: [facility];  Service: Endoscopy;  Laterality: N/A;  . EYE SURGERY    . JOINT REPLACEMENT  01-25-11   6'03 LTHA-hemi  . KNEE ARTHROPLASTY  01-25-11   '04-right, revised x2  . SPINAL CORD STIMULATOR IMPLANT  2009 APPROX   DR. ELSNER  . THORACIC SPINE SURGERY  01-25-11   6'02- then nerve stimulator implanted after  . TOTAL KNEE REVISION  02/01/2011   Procedure: TOTAL KNEE REVISION;  Surgeon: Mauri Pole;  Location: [facility] ORS;  Service: Orthopedics;  Laterality: Right;  . TUBAL LIGATION      FAMHx   Family History  Problem Relation Age of Onset  . Heart attack Father   . Emphysema Father   . Aneurysm Mother        CEREBRAL  . Emphysema Mother   . Dementia Mother   . Diabetes Son   . Hypertension Son   . Hypertension Son   . Cancer Sister        Widely Metastatic  . Asthma  Son        x2  . Breast cancer Paternal Aunt   . Rheum arthritis Maternal Aunt     SOCHx    reports that she has been smoking Cigarettes.  She started smoking about 54 years ago. She has a 37.50 pack-year smoking history. She has never used smokeless tobacco. She reports that she does not drink alcohol or use drugs.  Outpatient Medications   No current facility-administered medications on file prior to encounter.    Current Outpatient Prescriptions on File Prior to Encounter  Medication Sig Dispense Refill  . acetaminophen (TYLENOL) 325 MG tablet Take 2 tablets (650 mg total) by mouth every 6 (six) hours as needed for headache (pain).    Marland Kitchen apixaban (ELIQUIS) 5 MG TABS tablet Take 1 tablet (5 mg total) by mouth 2 (two) times daily. 60 tablet 6  . diclofenac sodium (VOLTAREN) 1 % GEL Apply 2 g  topically 4 (four) times daily as needed. To right chest area for pain 100 g 0  . levothyroxine (SYNTHROID, LEVOTHROID) 150 MCG tablet TAKE 1 TO 1+1/2 TABLETS DAILY AS DIRECTED (Patient taking differently: Take 1 1/2 tablet (225 mcg) by mouth on Tuesday, Thursday before breakfast, take 1 tablet (150 mcg) before breakfast on Sunday, Monday, Wednesday, Friday, Saturday) 135 tablet 1  . metoprolol succinate (TOPROL XL) 50 MG 24 hr tablet Take 1 tablet (50 mg total) by mouth 2 (two) times daily. Take with or immediately following a meal. 60 tablet 6  . Multiple Vitamin (MULTIVITAMIN WITH MINERALS) TABS tablet Take 1 tablet by mouth daily. Women's Alive Gummies    . pantoprazole (PROTONIX) 40 MG tablet Take 1 tablet (40 mg total) by mouth 2 (two) times daily. 60 tablet 0  . sertraline (ZOLOFT) 100 MG tablet Take 100 mg by mouth at bedtime.    . promethazine (PHENERGAN) 25 MG tablet Take 25 mg by mouth every 6 (six) hours as needed for nausea or vomiting.    . [DISCONTINUED] ferrous sulfate 325 (65 FE) MG tablet Take 1 tablet (325 mg total) by mouth 3 (three) times daily after meals.    . [DISCONTINUED] rivaroxaban (XARELTO) 10 MG TABS tablet Take 1 tablet (10 mg total) by mouth daily. 12 tablet     Inpatient Medications    Scheduled Meds:   Continuous Infusions: . sodium chloride 50 mL/hr at 08/15/16 1159    PRN Meds:    ALLERGIES   Allergies  Allergen Reactions  . Albuterol Other (See Comments)    Had difficulty breathing after a nebulizer treatment  . Penicillins Anaphylaxis and Swelling    Has patient had a PCN reaction causing immediate rash, facial/tongue/throat swelling, SOB or lightheadedness with hypotension: Yes Has patient had a PCN reaction causing severe rash involving mucus membranes or skin necrosis: Rash Has patient had a PCN reaction that required hospitalization: No Has patient had a PCN reaction occurring within the last 10 years: No If all of the above answers are  "NO", then may proceed with Cephalosporin use.  [name] [Kdc:Albuterol] Shortness Of Breath  . Sulfa Antibiotics Swelling    Throat swells, but no shortness of breath noted  . Zanaflex [Tizanidine] Other (See Comments)    Possible confusion (??)  . Codeine Nausea And Vomiting  . Ecotrin [Aspirin] Nausea And Vomiting    ROS   Pertinent items noted in HPI and remainder of comprehensive ROS otherwise negative.  Vitals   Vitals:   08/15/16 1330 08/15/16 1345 08/15/16 1400 08/15/16  1415  BP: 110/82 108/81 115/73 109/72  Pulse: (!) 138 (!) 137 (!) 139 (!) 138  Resp: 19 10 (!) 21 (!) 22  Temp:      TempSrc:      SpO2: 96% 97% 97% 97%   No intake or output data in the 24 hours ending 08/15/16 1446 There were no vitals filed for this visit.  Physical Exam   General appearance: alert, appears older than stated age, mild distress and pale Neck: no carotid bruit and no JVD Lungs: clear to auscultation bilaterally Heart: regular rate and rhythm and no murmur, tachycardic Abdomen: soft, non-tender; bowel sounds normal; no masses,  no organomegaly Extremities: extremities normal, atraumatic, no cyanosis or edema Pulses: 2+ and symmetric Skin: Skin color, texture, turgor normal. No rashes or lesions Neurologic: Mental status: Alert, oriented, thought content appropriate Psych: appears fatigued, frustruated  Labs   Results for orders placed or performed during the hospital encounter of 08/15/16 (from the past 48 hour(s))  Basic metabolic panel     Status: Abnormal   Collection Time: 08/15/16 11:13 AM  Result Value Ref Range   Sodium 135 135 - 145 mmol/L   Potassium 3.5 3.5 - 5.1 mmol/L   Chloride 100 (L) 101 - 111 mmol/L   CO2 25 22 - 32 mmol/L   Glucose, Bld 123 (H) 65 - 99 mg/dL   BUN 12 6 - 20 mg/dL   Creatinine, Ser 0.89 0.44 - 1.00 mg/dL   Calcium 9.4 8.9 - 10.3 mg/dL   GFR calc non Af Amer >60 >60 mL/min   GFR calc Af Amer >60 >60 mL/min    Comment: (NOTE) The eGFR  has been calculated using the CKD EPI equation. This calculation has not been validated in all clinical situations. eGFR's persistently <60 mL/min signify possible Chronic Kidney Disease.    Anion gap 10 5 - 15  CBC     Status: None   Collection Time: 08/15/16 11:13 AM  Result Value Ref Range   WBC 8.8 4.0 - 10.5 K/uL   RBC 4.96 3.87 - 5.11 MIL/uL   Hemoglobin 15.0 12.0 - 15.0 g/dL   HCT 42.7 36.0 - 46.0 %   MCV 86.1 78.0 - 100.0 fL   MCH 30.2 26.0 - 34.0 pg   MCHC 35.1 30.0 - 36.0 g/dL   RDW 14.8 11.5 - 15.5 %   Platelets 337 150 - 400 K/uL  I-stat troponin, ED     Status: None   Collection Time: 08/15/16 11:26 AM  Result Value Ref Range   Troponin i, poc 0.05 0.00 - 0.08 ng/mL   Comment 3            Comment: Due to the release kinetics of cTnI, a negative result within the first hours of the onset of symptoms does not rule out myocardial infarction with certainty. If myocardial infarction is still suspected, repeat the test at appropriate intervals.     ECG   Atrial flutter with primarily 2:1 AV block at 136 - Personally Reviewed  Telemetry   Atrial flutter with rapid ventricular response - Personally Reviewed  Radiology   Dg Chest Portable 1 View  Result Date: 08/15/2016 CLINICAL DATA:  Tachycardia beginning 2-3 days ago, history emphysema, COPD, GERD, hypertension EXAM: PORTABLE CHEST 1 VIEW COMPARISON:  Portable exam 1133 hours compared to 08/03/2016 FINDINGS: Intraspinal stimulator lead upper thoracic spine unchanged. Normal heart size, mediastinal contours, and pulmonary vascularity. Lungs hyperinflated consistent with COPD. No pulmonary infiltrate, pleural effusion or  pneumothorax. Diffuse osseous demineralization. Prior cervical spine fusion. IMPRESSION: Hyperinflated lungs consistent with history of COPD. No acute abnormalities. Electronically Signed   By: Lavonia Dana M.D.   On: 08/15/2016 11:55    Cardiac Studies   N/A  Assessment   Principal Problem:    Atrial flutter ([place])   Plan   1. Mrs. Guerrieri is in atrial flutter with primarily 2:1 AV Block - not improved with recent increase in her medications. I suspect she will be difficult to rate control. Will plan to admit for rate control - add IV diltiazem and continue po Toprol XL. Will try to arrange for TEE/DCCV tomorrow if possible. Continue Eliquis. [place] for diet today, but keep NPO p MN. Family present in the room and agreeable to this plan.  Time Spent Directly with Patient:  45 minutes  Length of Stay:  LOS: 0 days   Pixie Casino, MD, [place]  Attending Cardiologist  Direct Dial: [phone]  Fax: [phone]  Website: [email or web address] 08/15/2016, 2:46 PM

## 2016-08-15 NOTE — ED Notes (Signed)
Report given to RN Dawn on 3W. RN agrees patient is appropriate for stepdown. Remains on zoll.

## 2016-08-15 NOTE — Progress Notes (Signed)
D/w nursing after patient arrived on 3W. Reviewed telemetry and obtained a 12 lead EKG. She has been having atrial flutter with frequent long periods of no ventricular response. The longest ventricular "pause" was 7 seconds. She is symptomatic with this. She was given IV cardizem bolus in the ER - will hold further cardizem as well as her Toprol XL (she took this am). Plan was for TEE/DCCV, but may need pacemaker first. Keep NPO p MN. Will hold Eliquis and start IV heparin in case pacemaker is necessary.  Pixie Casino, MD, [place]  Attending Cardiologist  Direct Dial: [phone]  Fax: [phone]  Website:  [email or web address]

## 2016-08-15 NOTE — ED Notes (Signed)
Pt received 15 mg bolus of cardizem, pt rhythm noted to have lengthy pauses- symptomatic at this time. EDP notified and at bedside to assess. Cardizem stopped. Cardiology paged.

## 2016-08-15 NOTE — Progress Notes (Signed)
ANTICOAGULATION CONSULT NOTE - Initial Consult  Pharmacy Consult for Heparin Indication: atrial fibrillation  Allergies  Allergen Reactions  . Albuterol Other (See Comments)    Had difficulty breathing after a nebulizer treatment  . Penicillins Anaphylaxis and Swelling    Has patient had a PCN reaction causing immediate rash, facial/tongue/throat swelling, SOB or lightheadedness with hypotension: Yes Has patient had a PCN reaction causing severe rash involving mucus membranes or skin necrosis: Rash Has patient had a PCN reaction that required hospitalization: No Has patient had a PCN reaction occurring within the last 10 years: No If all of the above answers are "NO", then may proceed with Cephalosporin use.  Destiny Hughes [Kdc:Albuterol] Shortness Of Breath  . Sulfa Antibiotics Swelling    Throat swells, but no shortness of breath noted  . Zanaflex [Tizanidine] Other (See Comments)    Possible confusion (??)  . Codeine Nausea And Vomiting  . Ecotrin [Aspirin] Nausea And Vomiting    Patient Measurements:    Wt: 59.2 kg Ideal body weight: 59.3 kg (130 lb 11.7 oz)  Heparin Dosing Weight: 59 kg  Vital Signs: Temp: 97.8 F (36.6 C) (05/28 1111) Temp Source: Oral (05/28 1111) BP: 120/59 (05/28 1606) Pulse Rate: 74 (05/28 1606)  Labs:  Recent Labs  08/15/16 1113  HGB 15.0  HCT 42.7  PLT 337  CREATININE 0.89    Estimated Creatinine Clearance: 53.4 mL/min (by C-G formula based on SCr of 0.89 mg/dL).   Medical History: Past Medical History:  Diagnosis Date  . Arthritis 01-25-11   back and most joints  . Blood transfusion    after Thoracic surgery ?'04  . Chronic back pain   . Chronic neck pain   . Collapse of right lung   . COPD (chronic obstructive pulmonary disease) (HCC)    Hx. Bronchitis occ, 01-25-11 somes issue now taking  Z-pak-started 01-24-11/Scar tissue  present  from previous lung collapse  . Depression   . Dyspnea   . Dysrhythmia    hx.PAT- tx. Inderal   . Emphysema of lung ([place])   . GERD (gastroesophageal reflux disease)    tx. TUMS  . Glaucoma   . Headache(784.0) 01-25-11   neck and nerve pain related.  . Hyperlipemia   . Hypothyroidism    tx. Levothyroxine  . Nephrolithiasis   . Neuromuscular disorder ([place]) 01-25-11   Pain/nerve stimulator implanted -2 yrs ago.  . Palpitation   . Tachycardia     Assessment: 75 YOF recently found to have Afib with RVR and started on Eliquis on 5/23. The patient presented on 5/28 with weakness/fatigue and was admitted for rate control. Eliquis was held for possible need for pacemaker and pharmacy has been consulted to bridge with heparin.  It was clarified with the patient that her last dose of apixaban was this morning at 0700. Will plan to start the heparin drip ~12 hours after the last dose. It is expected that the heparin level will be falsely elevated initially - so will monitor both aPTT/heparin level until correlated.   Goal of Therapy:  Heparin level 0.3-0.7 units/ml aPTT 66-102 seconds Monitor platelets by anticoagulation protocol: Yes   Plan:  1. Start heparin at 850 units/hr (8.5 ml/hr) starting at 2000 today 2. Daily heparin level, aPTT, CBC q72h 3. Will continue to monitor for any signs/symptoms of bleeding and will follow up with heparin level in 8 hours   Thank you for allowing pharmacy to be a part of this patient's care.  Alycia Rossetti,  PharmD, BCPS Clinical Pharmacist 08/15/2016 5:27 PM

## 2016-08-15 NOTE — ED Provider Notes (Signed)
[facility] DEPT Provider Note   CSN: [id number] Arrival date & time: 08/15/16  1057  By signing my name below, I, Evelene Croon, attest that this documentation has been prepared under the direction and in the presence of Virgel Manifold, MD . Electronically Signed: Evelene Croon, Scribe. 08/15/2016. 11:46 AM.  History   Chief Complaint Chief Complaint  Patient presents with  . Tachycardia    The history is provided by the patient, medical records and a relative. No language interpreter was used.     HPI Comments:  Destiny Hughes is a 71 y.o. female with a history of SVT,  who presents to the Emergency Department complaining of persistent palpitations x a few days. She describes a heart racing sensation. Her family reports  tachycardia. He has recorded HRs ranging from 200 to the 140s. Pt was advised to double up on her metoprolol on 08/09/2016 but her HR remained elevated. Pt reports occasional SOB and nausea. She denies pain at this time. No cough, vomiting, or acute LE swelling.  She was started on eliquis on 08/09/2016 and taken off of Digoxin.   Past Medical History:  Diagnosis Date  . Arthritis 01-25-11   back and most joints  . Blood transfusion    after Thoracic surgery ?'04  . Chronic back pain   . Chronic neck pain   . Collapse of right lung   . COPD (chronic obstructive pulmonary disease) (HCC)    Hx. Bronchitis occ, 01-25-11 somes issue now taking  Z-pak-started 01-24-11/Scar tissue  present  from previous lung collapse  . Depression   . Dyspnea   . Dysrhythmia    hx.PAT- tx. Inderal  . Emphysema of lung ([place])   . GERD (gastroesophageal reflux disease)    tx. TUMS  . Glaucoma   . Headache(784.0) 01-25-11   neck and nerve pain related.  . Hyperlipemia   . Hypothyroidism    tx. Levothyroxine  . Nephrolithiasis   . Neuromuscular disorder ([place]) 01-25-11   Pain/nerve stimulator implanted -2 yrs ago.  . Palpitation   . Tachycardia     Patient Active Problem  List   Diagnosis Date Noted  . SVT (supraventricular tachycardia) ([place]) 07/11/2016  . Weight loss 07/08/2016  . AKI (acute kidney injury) ([place]) 07/02/2016  . Acute kidney injury ([place]) 07/01/2016  . Hyperglycemia 07/01/2016  . Hyponatremia 07/01/2016  . Intractable nausea and vomiting 07/01/2016  . Abnormal EKG 07/01/2016  . Diastolic CHF ([place]) 38/75/6433  . Medication management 04/28/2015  . Emphysema lung ([place]) 12/26/2014  . GERD (gastroesophageal reflux disease) 12/26/2014  . Chronic bronchitis ([place]) 11/13/2014  . COPD (chronic obstructive pulmonary disease) ([place]) 10/03/2014  . At high risk for falls 10/03/2014  . Hypothyroidism 10/03/2014  . Mitral valve prolapse 10/03/2014  . Encounter for Medicare annual wellness exam 10/03/2014  . IBS (irritable bowel syndrome) 04/01/2014  . H/O total knee replacement 04/22/2013  . Essential hypertension 04/15/2013  . Vitamin D deficiency 04/15/2013  . Encounter for long-term (current) use of medications 04/15/2013  . Prediabetes 04/15/2013  . Osteoarthritis 01/31/2011  . Hyperlipemia   . Glaucoma   . Chronic neck pain   . Chronic back pain     Past Surgical History:  Procedure Laterality Date  . APPENDECTOMY  01-25-11  . CARPAL TUNNEL RELEASE  01-25-11   [place].  . CERVICAL SPINE SURGERY  01-25-11   2005-multiple levels  . CHOLECYSTECTOMY  01-25-11   '07  . COLONOSCOPY WITH PROPOFOL N/A 07/23/2012   Procedure: COLONOSCOPY  WITH PROPOFOL;  Surgeon: Garlan Fair, MD;  Location: [facility] END[facility]COPY;  Service: Endoscopy;  Laterality: N/A;  . EYE SURGERY    . JOINT REPLACEMENT  01-25-11   6'03 LTHA-hemi  . KNEE ARTHROPLASTY  01-25-11   '04-right, revised x2  . SPINAL CORD STIMULATOR IMPLANT  2009 APPROX   DR. ELSNER  . THORACIC SPINE SURGERY  01-25-11   6'02- then nerve stimulator implanted after  . TOTAL KNEE REVISION  02/01/2011   Procedure: TOTAL KNEE REVISION;  Surgeon: Mauri Pole;  Location: [facility] ORS;  Service: Orthopedics;   Laterality: Right;  . TUBAL LIGATION      OB History    No data available       Home Medications    Prior to Admission medications   Medication Sig Start Date End Date Taking? Authorizing Provider  acetaminophen (TYLENOL) 325 MG tablet Take 2 tablets (650 mg total) by mouth every 6 (six) hours as needed for headache (pain). 07/14/16   Ghimire, Henreitta Leber, MD  apixaban (ELIQUIS) 5 MG TABS tablet Take 1 tablet (5 mg total) by mouth 2 (two) times daily. 08/09/16   Sherran Needs, NP  diclofenac sodium (VOLTAREN) 1 % GEL Apply 2 g topically 4 (four) times daily as needed. To right chest area for pain 07/14/16   Jonetta Osgood, MD  levothyroxine (SYNTHROID, LEVOTHROID) 150 MCG tablet TAKE 1 TO 1+1/2 TABLETS DAILY AS DIRECTED Patient taking differently: Take 1 1/2 tablet (225 mcg) by mouth on Tuesday, Thursday before breakfast, take 1 tablet (150 mcg) before breakfast on Sunday, Monday, Wednesday, Friday, Saturday 05/14/16   Unk Pinto, MD  metoprolol succinate (TOPROL XL) 50 MG 24 hr tablet Take 1 tablet (50 mg total) by mouth 2 (two) times daily. Take with or immediately following a meal. 08/09/16 08/09/17  Sherran Needs, NP  Multiple Vitamin (MULTIVITAMIN WITH MINERALS) TABS tablet Take 1 tablet by mouth daily. [employer] , Historical, MD  pantoprazole (PROTONIX) 40 MG tablet Take 1 tablet (40 mg total) by mouth 2 (two) times daily. 07/14/16   Ghimire, Henreitta Leber, MD  promethazine (PHENERGAN) 25 MG tablet Take 25 mg by mouth every 6 (six) hours as needed for nausea or vomiting.    [provider]  sertraline (ZOLOFT) 100 MG tablet Take 100 mg by mouth at bedtime.    [provider]    Family History Family History  Problem Relation Age of Onset  . Heart attack Father   . Emphysema Father   . Aneurysm Mother        CEREBRAL  . Emphysema Mother   . Dementia Mother   . Diabetes Son   . Hypertension Son   . Hypertension Son   . Cancer  Sister        Widely Metastatic  . Asthma Son        x2  . Breast cancer Paternal Aunt   . Rheum arthritis Maternal Aunt     Social History Social History  Substance Use Topics  . Smoking status: Current Every Day Smoker    Packs/day: 0.75    Years: 50.00    Types: Cigarettes    Start date: 09/13/1961  . Smokeless tobacco: Never Used     Comment: Peak rate of 1ppd  . Alcohol use No     Comment: none in 10 years     Allergies   Albuterol; Penicillins; Ventolin [kdc:albuterol]; Sulfa antibiotics; Zanaflex [tizanidine]; Codeine; and Ecotrin [aspirin]  Review of Systems Review of Systems  Constitutional: Positive for fatigue. Negative for fever.  Respiratory: Positive for shortness of breath. Negative for cough.   Cardiovascular: Positive for palpitations. Negative for leg swelling.  Gastrointestinal: Positive for nausea. Negative for vomiting.  All other systems reviewed and are negative.    Physical Exam Updated Vital Signs BP (!) 85/69 (BP Location: Left Arm)   Pulse (!) 148   Temp 97.8 F (36.6 C) (Oral)   Resp 18   SpO2 94%   Physical Exam  Constitutional: She is oriented to person, place, and time. She appears well-developed and well-nourished. No distress.  HENT:  Head: Normocephalic and atraumatic.  Eyes: EOM are normal.  Neck: Normal range of motion.  Cardiovascular: Normal heart sounds.  An irregularly irregular rhythm present. Tachycardia present.   Pulmonary/Chest: Effort normal and breath sounds normal.  Abdominal: Soft. She exhibits no distension. There is no tenderness.  Musculoskeletal: Normal range of motion. She exhibits no edema.  No LE swelling  Neurological: She is alert and oriented to person, place, and time.  Skin: Skin is warm and dry.  Psychiatric: She has a normal mood and affect. Judgment normal.  Nursing note and vitals reviewed.    ED Treatments / Results  DIAGNOSTIC STUDIES:  Oxygen Saturation is 98% on RA, normal by my  interpretation.    COORDINATION OF CARE:  11:32 AM Discussed treatment plan with pt at bedside and pt agreed to plan.  1:07 PM Pt reassessed and updated with results.   Labs (all labs ordered are listed, but only abnormal results are displayed) Labs Reviewed  BASIC METABOLIC PANEL - Abnormal; Notable for the following:       Result Value   Chloride 100 (*)    Glucose, Bld 123 (*)    All other components within normal limits  BASIC METABOLIC PANEL - Abnormal; Notable for the following:    Sodium 133 (*)    Potassium 3.3 (*)    Glucose, Bld 105 (*)    Creatinine, Ser 1.01 (*)    Calcium 8.8 (*)    GFR calc non Af Amer 54 (*)    All other components within normal limits  TSH - Abnormal; Notable for the following:    TSH 0.024 (*)    All other components within normal limits  APTT - Abnormal; Notable for the following:    aPTT 65 (*)    All other components within normal limits  APTT - Abnormal; Notable for the following:    aPTT 43 (*)    All other components within normal limits  HEPARIN LEVEL (UNFRACTIONATED) - Abnormal; Notable for the following:    Heparin Unfractionated <0.10 (*)    All other components within normal limits  MRSA PCR SCREENING  CBC  HEPARIN LEVEL (UNFRACTIONATED)  CBC  PROTIME-INR  HEPARIN LEVEL (UNFRACTIONATED)  APTT  I-STAT TROPOININ, ED    EKG  EKG Interpretation  Date/Time:  Monday Aug 15 2016 11:06:33 EDT Ventricular Rate:  136 PR Interval:    QRS Duration: 128 QT Interval:  346 QTC Calculation: 520 R Axis:   -86 Text Interpretation:  atrial flutter with primarily 2:1 block diffuse ST segment abnormalities Abnormal ECG Confirmed by Wilson Singer  MD, Annie Main ([id number]) on 08/15/2016 11:22:40 AM Also confirmed by Wilson Singer  MD, Nikki Rusnak [phone]), editor Verna Czech [phone])  on 08/15/2016 11:53:23 AM       Radiology No results found.  Procedures Procedures (including critical care time)  Medications Ordered in ED  Medications - No data to  display   Initial Impression / Assessment and Plan / ED Course  I have reviewed the triage vital signs and the nursing notes.  Pertinent labs & imaging results that were available during my care of the patient were reviewed by me and considered in my medical decision making (see chart for details).  70yF with palpitations and generalized weakness. Afib/flutter with RVR. Has been taking additional metoprolol at home without improvement. Given 5mg  IVx2 in the ED and HR still has been elevated up to 150. BP fine. Has only been on eliquis less than a week. Will discuss with cardiology.   3:55 PM Called to room my nursing. Pt remains in aflutter with mostly 4:1 block but episodes of >10:1. Pt says she doesn't feel great, but doesn't seem distressed. Awake and mentating fine. BP ok. Had cardizem bolus. Gtt held for now. Pads placed on patient. Admitted to cards.   Final Clinical Impressions(s) / ED Diagnoses   Final diagnoses:  Atrial fibrillation with rapid ventricular response (HCC)    New Prescriptions New Prescriptions   No medications on file   I personally preformed the services scribed in my presence. The recorded information has been reviewed is accurate. Virgel Manifold, MD.     Virgel Manifold, MD 08/21/16 [phone]

## 2016-08-16 ENCOUNTER — Encounter (HOSPITAL_COMMUNITY)
Admission: EM | Disposition: A | Discharge status: Home / Self Care | Payer: Self-pay | Source: Home / Self Care | Attending: Internal Medicine

## 2016-08-16 ENCOUNTER — Telehealth (HOSPITAL_COMMUNITY): Payer: Self-pay | Admitting: *Deleted

## 2016-08-16 ENCOUNTER — Inpatient Hospital Stay (HOSPITAL_COMMUNITY): Payer: Medicare Other

## 2016-08-16 DIAGNOSIS — I483 Typical atrial flutter: Secondary | ICD-10-CM

## 2016-08-16 DIAGNOSIS — I471 Supraventricular tachycardia: Secondary | ICD-10-CM

## 2016-08-16 DIAGNOSIS — I4892 Unspecified atrial flutter: Secondary | ICD-10-CM

## 2016-08-16 HISTORY — PX: A-FLUTTER ABLATION: EP1230

## 2016-08-16 LAB — CBC
HEMATOCRIT: 36.6 % (ref 36.0–46.0)
HEMOGLOBIN: 12.4 g/dL (ref 12.0–15.0)
MCH: 29.2 pg (ref 26.0–34.0)
MCHC: 33.9 g/dL (ref 30.0–36.0)
MCV: 86.3 fL (ref 78.0–100.0)
Platelets: 284 10*3/uL (ref 150–400)
RBC: 4.24 MIL/uL (ref 3.87–5.11)
RDW: 15.3 % (ref 11.5–15.5)
WBC: 9 10*3/uL (ref 4.0–10.5)

## 2016-08-16 LAB — BASIC METABOLIC PANEL
Anion gap: 10 (ref 5–15)
BUN: 13 mg/dL (ref 6–20)
CALCIUM: 8.8 mg/dL — AB (ref 8.9–10.3)
CHLORIDE: 101 mmol/L (ref 101–111)
CO2: 22 mmol/L (ref 22–32)
CREATININE: 1.01 mg/dL — AB (ref 0.44–1.00)
GFR calc non Af Amer: 54 mL/min — ABNORMAL LOW (ref >60)
GLUCOSE: 105 mg/dL — AB (ref 65–99)
Potassium: 3.3 mmol/L — ABNORMAL LOW (ref 3.5–5.1)
Sodium: 133 mmol/L — ABNORMAL LOW (ref 135–145)

## 2016-08-16 LAB — HEPARIN LEVEL (UNFRACTIONATED)
HEPARIN UNFRACTIONATED: 0.38 [IU]/mL (ref 0.30–0.70)
Heparin Unfractionated: 0.69 IU/mL (ref 0.30–0.70)

## 2016-08-16 LAB — TSH: TSH: 0.024 u[IU]/mL — ABNORMAL LOW (ref 0.350–4.500)

## 2016-08-16 LAB — PROTIME-INR
INR: 1.07
Prothrombin Time: 13.9 seconds (ref 11.4–15.2)

## 2016-08-16 LAB — MRSA PCR SCREENING: MRSA BY PCR: NEGATIVE

## 2016-08-16 LAB — APTT
aPTT: 65 seconds — ABNORMAL HIGH (ref 24–36)
aPTT: 43 seconds — ABNORMAL HIGH (ref 24–36)

## 2016-08-16 SURGERY — A-FLUTTER ABLATION

## 2016-08-16 MED ORDER — MIDAZOLAM HCL 5 MG/5ML IJ SOLN
INTRAMUSCULAR | Status: AC
  Filled 2016-08-16: qty 5

## 2016-08-16 MED ORDER — MIDAZOLAM HCL 5 MG/5ML IJ SOLN
INTRAMUSCULAR | Status: DC | PRN
Start: 2016-08-16 — End: 2016-08-16
  Administered 2016-08-16 (×6): 1 mg via INTRAVENOUS
  Administered 2016-08-16 (×2): 2 mg via INTRAVENOUS
  Administered 2016-08-16: 1 mg via INTRAVENOUS

## 2016-08-16 MED ORDER — ONDANSETRON HCL 4 MG/2ML IJ SOLN
4.0000 mg | Freq: Four times a day (QID) | INTRAMUSCULAR | Status: DC | PRN
  Administered 2016-08-16: 4 mg via INTRAVENOUS
  Filled 2016-08-16: qty 2

## 2016-08-16 MED ORDER — BUPIVACAINE HCL (PF) 0.25 % IJ SOLN
INTRAMUSCULAR | Status: AC
Start: 2016-08-16 — End: 2016-08-16
  Filled 2016-08-16: qty 30

## 2016-08-16 MED ORDER — HEPARIN (PORCINE) IN NACL 2-0.9 UNIT/ML-% IJ SOLN
INTRAMUSCULAR | Status: AC | PRN
  Administered 2016-08-16: 500 mL

## 2016-08-16 MED ORDER — SODIUM CHLORIDE 0.9 % IV SOLN: INTRAVENOUS | Status: DC

## 2016-08-16 MED ORDER — HEPARIN (PORCINE) IN NACL 2-0.9 UNIT/ML-% IJ SOLN
INTRAMUSCULAR | Status: AC
  Filled 2016-08-16: qty 500

## 2016-08-16 MED ORDER — ACETAMINOPHEN 325 MG PO TABS
650.0000 mg | ORAL_TABLET | ORAL | Status: DC | PRN
  Administered 2016-08-16 – 2016-08-17 (×3): 650 mg via ORAL
  Filled 2016-08-16 (×3): qty 2

## 2016-08-16 MED ORDER — SODIUM CHLORIDE 0.9 % IV SOLN: 250.0000 mL | INTRAVENOUS | Status: DC | PRN

## 2016-08-16 MED ORDER — BUPIVACAINE HCL (PF) 0.25 % IJ SOLN
INTRAMUSCULAR | Status: DC | PRN
  Administered 2016-08-16: 20 mL

## 2016-08-16 MED ORDER — SODIUM CHLORIDE 0.9% FLUSH
3.0000 mL | Freq: Two times a day (BID) | INTRAVENOUS | Status: DC
  Administered 2016-08-16: 3 mL via INTRAVENOUS

## 2016-08-16 MED ORDER — FENTANYL CITRATE (PF) 100 MCG/2ML IJ SOLN
INTRAMUSCULAR | Status: DC | PRN
  Administered 2016-08-16: 25 ug via INTRAVENOUS
  Administered 2016-08-16 (×5): 12.5 ug via INTRAVENOUS
  Administered 2016-08-16: 25 ug via INTRAVENOUS
  Administered 2016-08-16: 12.5 ug via INTRAVENOUS
  Administered 2016-08-16: 25 ug via INTRAVENOUS

## 2016-08-16 MED ORDER — SODIUM CHLORIDE 0.9% FLUSH: 3.0000 mL | INTRAVENOUS | Status: DC | PRN

## 2016-08-16 MED ORDER — ONDANSETRON HCL 4 MG/2ML IJ SOLN: 4.0000 mg | Freq: Four times a day (QID) | INTRAMUSCULAR | Status: DC | PRN

## 2016-08-16 MED ORDER — POTASSIUM CHLORIDE CRYS ER 20 MEQ PO TBCR
40.0000 meq | EXTENDED_RELEASE_TABLET | Freq: Once | ORAL | Status: AC
  Administered 2016-08-16: 40 meq via ORAL
  Filled 2016-08-16: qty 2

## 2016-08-16 MED ORDER — SODIUM CHLORIDE 0.9% FLUSH: 3.0000 mL | Freq: Two times a day (BID) | INTRAVENOUS | Status: DC

## 2016-08-16 MED ORDER — FENTANYL CITRATE (PF) 100 MCG/2ML IJ SOLN
INTRAMUSCULAR | Status: AC
  Filled 2016-08-16: qty 2

## 2016-08-16 MED ORDER — ACETAMINOPHEN 325 MG PO TABS: 650.0000 mg | ORAL_TABLET | ORAL | Status: DC | PRN

## 2016-08-16 SURGICAL SUPPLY — 11 items
BAG SNAP BAND KOVER 36X36 (MISCELLANEOUS) ×1 IMPLANT
CATH BLAZER 7FR 4MM LG 5031TK2 (ABLATOR) ×1 IMPLANT
CATH BLAZERPRIME XP (ABLATOR) ×1 IMPLANT
CATH JOSEPH QUAD ALLRED 6F REP (CATHETERS) ×2 IMPLANT
CATH POLARIS X 2.5/5/2.5 DECAP (CATHETERS) ×1 IMPLANT
PACK EP LATEX FREE (CUSTOM PROCEDURE TRAY) ×2
PACK EP LF (CUSTOM PROCEDURE TRAY) IMPLANT
PAD DEFIB LIFELINK (PAD) ×1 IMPLANT
SHEATH PINNACLE 6F 10CM (SHEATH) ×1 IMPLANT
SHEATH PINNACLE 8F 10CM (SHEATH) ×4 IMPLANT
SHIELD RADPAD SCOOP 12X17 (MISCELLANEOUS) ×1 IMPLANT

## 2016-08-16 NOTE — Interval H&P Note (Signed)
History and Physical Interval Note:  08/16/2016 2:54 PM  [name]  has presented today for surgery, with the diagnosis of aflutter  The various methods of treatment have been discussed with the patient and family. After consideration of risks, benefits and other options for treatment, the patient has consented to  Procedure(s): A-Flutter Ablation (N/A) as a surgical intervention .  The patient's history has been reviewed, patient examined, no change in status, stable for surgery.  I have reviewed the patient's chart and labs.  Questions were answered to the patient's satisfaction.     Destiny Hughes

## 2016-08-16 NOTE — Telephone Encounter (Signed)
I cld pt to offer appt for pt to come to afib clinic per Cecilie Kicks. See previous note.  Pts dose of metoprolol had been adjusted for HR and pt will go to ED should something emergent come up.  Waiting for pt Cl[facility] since I [facility]

## 2016-08-16 NOTE — Progress Notes (Addendum)
Site area: RFV x 3 Site Prior to Removal:  Level 0 Pressure Applied For: 25min Manual:  yes  Patient Status During Pull: stable  Post Pull Site:  Level 0 Post Pull Instructions Given: yes  Post Pull Pulses Present:  Dressing Applied:  tegaderm Bedrest begins @ 0221 till 2315 Comments:

## 2016-08-16 NOTE — Progress Notes (Signed)
ANTICOAGULATION CONSULT NOTE   Pharmacy Consult for Heparin Indication: atrial fibrillation    Patient Measurements: Height: 5\' 3"  (160 cm) Weight: 135 lb (61.2 kg) IBW/kg (Calculated) : 52.4  Wt: 59.2 kg Ideal body weight: 52.4 kg (115 lb 8.3 oz) Adjusted ideal body weight: 55.9 kg (123 lb 5 oz)  Heparin Dosing Weight: 59 kg  Vital Signs: Temp: 97.6 F (36.4 C) (05/29 0347) Temp Source: Oral (05/29 0347) BP: 119/96 (05/29 0347) Pulse Rate: 140 (05/29 0057)  Labs:  Recent Labs  08/15/16 1113 08/16/16 0353  HGB 15.0 12.4  HCT 42.7 36.6  PLT 337 284  APTT  --  65*  HEPARINUNFRC  --  0.69  CREATININE 0.89 1.01*    Estimated Creatinine Clearance: 41.6 mL/min (A) (by C-G formula based on SCr of 1.01 mg/dL (H)).   Medical History: Past Medical History:  Diagnosis Date  . Arthritis 01-25-11   back and most joints  . Blood transfusion    after Thoracic surgery ?'04  . Chronic back pain   . Chronic neck pain   . Collapse of right lung   . COPD (chronic obstructive pulmonary disease) (HCC)    Hx. Bronchitis occ, 01-25-11 somes issue now taking  Z-pak-started 01-24-11/Scar tissue  present  from previous lung collapse  . Depression   . Dyspnea   . Dysrhythmia    hx.PAT- tx. Inderal  . Emphysema of lung ([place])   . GERD (gastroesophageal reflux disease)    tx. TUMS  . Glaucoma   . Headache(784.0) 01-25-11   neck and nerve pain related.  . Hyperlipemia   . Hypothyroidism    tx. Levothyroxine  . Nephrolithiasis   . Neuromuscular disorder ([place]) 01-25-11   Pain/nerve stimulator implanted -2 yrs ago.  . Palpitation   . Tachycardia     Assessment: 51 YOF recently found to have Afib with RVR and started on Eliquis on 5/23. The patient presented on 5/28 with weakness/fatigue and was admitted for rate control. Eliquis was held for possible need for pacemaker and pharmacy has been consulted to bridge with heparin.  Initial aPTT is slightly subtherapeutic at 65 (goal  66-102). Although heparin level is therapeutic suspect apixaban is still affecting lab value to some extent since levels are not correlating. CBC remains stable.   Goal of Therapy:  Heparin level 0.3-0.7 units/ml aPTT 66-102 seconds Monitor platelets by anticoagulation protocol: Yes   Plan:  1. Increase heparin to 950 units/hr (9.5 ml/hr)  2. Daily heparin level, aPTT, CBC q72h 3. Will continue to monitor for any signs/symptoms of bleeding and will follow up with heparin level and aPTT in 8 hours   Thank you for allowing pharmacy to be a part of this patient's care.  Vincenza Hews, PharmD, BCPS 08/16/2016, 4:48 AM

## 2016-08-16 NOTE — Telephone Encounter (Signed)
-----   Message from Sherran Needs, NP sent at 08/16/2016  8:47 AM EDT ----- Please call

## 2016-08-16 NOTE — H&P (View-Only) (Signed)
ELECTROPHYSIOLOGY CONSULT NOTE    Patient ID: Destiny Hughes MRN: [id number], DOB/AGE: 73/18/1947 71 y.o.  Admit date: 08/15/2016 Date of Consult: 08/16/2016   Primary Physician: Minette Brine Primary Cardiologist: Dr. Oval Linsey Electrophysiologist; Dr. Curt Bears  HPI: Destiny Hughes is a 71 y.o. female who is being seen today for the evaluation of rapid Aflutter, possible tachy-brady syndrome at the request of Dr. Debara Pickett.  PMHx includes HTN, COPD/smoking, HLD, MVP, hypothyroidism, CBP with an implanted nerve stimulator, depression.   She was seen by EP last month during a hospital stay with GI issues (intractable N/V, she was found with acute encephalopathy, hyponatremic, again AKI, notable is that her methadone and oxycodone were discontinued 2/2 confusion by her pain management MD though N/V onset was reported prior to this and stopped in preparation for her neurological evaluation out patient), for an SVT.  At that time in review of her records, it seems c/o palpitations go back to her childhood and as an adult was maintained on Digoxin and propanolol with SB and 1st degree AVblock being described by Dr. Mare Ferrari her prior cardiologist. I see PAT mention in her problem list, cardiology consult note mentions consulted for AFib/SVT.  I note she was hospitalized recently, discharged 07/03/16 for weakness/AMS this was felt to be multifactorial with dehydration, AKI,  though was found bradycardic and her digoxin was stopped and propanolol made to PRN for palpitations only.  Cardiology mentions SB 50's and concerns about possible dig toxicity.  SVT 170's was observed, given her ongoing GI issues/electrolyte imbalance at the time, was decided to f/u out patient for EP/ablation discussion.  She was seen by AFib clinic 08/09/16 noted AFlutter 2:1 conduction, in discussion with Dr. Curt Bears her BB was changed to Toprol  50mg  BID and started on Eliquis which she states she has been compliant with  though only recently started.  She was admitted yesterday with palpitations noting her HR elevated at home persistently (though initially had improved to 90's) withc/o feeling very tired, fatigued.  She was found in AFlutter RVR and started on dilt gtt, she developed slowing with pauses reported of 7 seconds that the patient was lightheaded with, her eliquis changed to heparin gtt pending TEE/DCCBV vs perhaps pacer need.  Last hospitalization there was mention of some degree of bradycardia historically though not appreciated during her stay then back on BB tx.  LABS: K+ 3.3 ( I will replace) BUN/Creat 1.01 WBC 9.0 H/H 12/36 plts 284 TSH 0.024 (last month with SVT 1.230) on replacement poc Trop 0.05   Past Medical History:  Diagnosis Date  . Arthritis 01-25-11   back and most joints  . Blood transfusion    after Thoracic surgery ?'04  . Chronic back pain   . Chronic neck pain   . Collapse of right lung   . COPD (chronic obstructive pulmonary disease) (HCC)    Hx. Bronchitis occ, 01-25-11 somes issue now taking  Z-pak-started 01-24-11/Scar tissue  present  from previous lung collapse  . Depression   . Dyspnea   . Dysrhythmia    hx.PAT- tx. Inderal  . Emphysema of lung ([place])   . GERD (gastroesophageal reflux disease)    tx. TUMS  . Glaucoma   . Headache(784.0) 01-25-11   neck and nerve pain related.  . Hyperlipemia   . Hypothyroidism    tx. Levothyroxine  . Nephrolithiasis   . Neuromuscular disorder ([place]) 01-25-11   Pain/nerve stimulator implanted -2 yrs ago.  . Palpitation   . Tachycardia  Surgical History:  Past Surgical History:  Procedure Laterality Date  . APPENDECTOMY  01-25-11  . CARPAL TUNNEL RELEASE  01-25-11   Bil.  . CERVICAL SPINE SURGERY  01-25-11   2005-multiple levels  . CHOLECYSTECTOMY  01-25-11   '07  . COLONOSCOPY WITH PROPOFOL N/A 07/23/2012   Procedure: COLONOSCOPY WITH PROPOFOL;  Surgeon: Garlan Fair, MD;  Location: [facility];  Service:  Endoscopy;  Laterality: N/A;  . EYE SURGERY    . JOINT REPLACEMENT  01-25-11   6'03 LTHA-hemi  . KNEE ARTHROPLASTY  01-25-11   '04-right, revised x2  . SPINAL CORD STIMULATOR IMPLANT  2009 APPROX   DR. ELSNER  . THORACIC SPINE SURGERY  01-25-11   6'02- then nerve stimulator implanted after  . TOTAL KNEE REVISION  02/01/2011   Procedure: TOTAL KNEE REVISION;  Surgeon: Mauri Pole;  Location: [facility];  Service: Orthopedics;  Laterality: Right;  . TUBAL LIGATION       Prescriptions Prior to Admission  Medication Sig Dispense Refill Last Dose  . acetaminophen (TYLENOL) 325 MG tablet Take 2 tablets (650 mg total) by mouth every 6 (six) hours as needed for headache (pain).   08/13/2016 at Unknown time  . apixaban (ELIQUIS) 5 MG TABS tablet Take 1 tablet (5 mg total) by mouth 2 (two) times daily. 60 tablet 6 08/15/2016 at 0700  . diclofenac sodium (VOLTAREN) 1 % GEL Apply 2 g topically 4 (four) times daily as needed. To right chest area for pain 100 g 0 Past Week at Unknown time  . levothyroxine (SYNTHROID, LEVOTHROID) 150 MCG tablet TAKE 1 TO 1+1/2 TABLETS DAILY AS DIRECTED (Patient taking differently: Take 1 1/2 tablet (225 mcg) by mouth on Tuesday, Thursday before breakfast, take 1 tablet (150 mcg) before breakfast on Sunday, Monday, Wednesday, Friday, Saturday) 135 tablet 1 08/15/2016 at Unknown time  . metoprolol succinate (TOPROL XL) 50 MG 24 hr tablet Take 1 tablet (50 mg total) by mouth 2 (two) times daily. Take with or immediately following a meal. 60 tablet 6 08/15/2016 at 0700  . Multiple Vitamin (MULTIVITAMIN WITH MINERALS) TABS tablet Take 1 tablet by mouth daily. Women's Alive Gummies   08/13/2016 at Unknown time  . pantoprazole (PROTONIX) 40 MG tablet Take 1 tablet (40 mg total) by mouth 2 (two) times daily. 60 tablet 0 08/14/2016 at Unknown time  . sertraline (ZOLOFT) 100 MG tablet Take 100 mg by mouth at bedtime.   08/14/2016 at Unknown time  . promethazine (PHENERGAN) 25 MG tablet Take 25  mg by mouth every 6 (six) hours as needed for nausea or vomiting.   prn    Inpatient Medications:  . levothyroxine  150 mcg Oral Once per day on Tue Thu  . [START ON 08/17/2016] levothyroxine  225 mcg Oral Once per day on Sun Mon Wed Fri Sat  . multivitamin with minerals  1 tablet Oral Daily  . pantoprazole  40 mg Oral BID  . sertraline  100 mg Oral QHS  . sodium chloride flush  3 mL Intravenous Q12H    Allergies:  Allergies  Allergen Reactions  . Albuterol Other (See Comments)    Had difficulty breathing after a nebulizer treatment  . Penicillins Anaphylaxis and Swelling    Has patient had a PCN reaction causing immediate rash, facial/tongue/throat swelling, SOB or lightheadedness with hypotension: Yes Has patient had a PCN reaction causing severe rash involving mucus membranes or skin necrosis: Rash Has patient had a PCN reaction that required hospitalization: No  Has patient had a PCN reaction occurring within the last 10 years: No If all of the above answers are "NO", then may proceed with Cephalosporin use.  [name] [Kdc:Albuterol] Shortness Of Breath  . Sulfa Antibiotics Swelling    Throat swells, but no shortness of breath noted  . Zanaflex [Tizanidine] Other (See Comments)    Possible confusion (??)  . Codeine Nausea And Vomiting  . Ecotrin [Aspirin] Nausea And Vomiting    Social History   Social History  . Marital status: Married    Spouse name: N/A  . Number of children: 2  . Years of education: N/A   Occupational History  . disabled    Social History Main Topics  . Smoking status: Current Every Day Smoker    Packs/day: 0.75    Years: 50.00    Types: Cigarettes    Start date: 09/13/1961  . Smokeless tobacco: Never Used     Comment: Peak rate of 1ppd  . Alcohol use No     Comment: none in 10 years  . Drug use: No  . Sexual activity: No   Other Topics Concern  . Not on file   Social History Narrative   She is from [facility]. She has always lived in [place]. She  has traveled to [place], [place], [place], [place], & [place]. 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 Prior exposure to parakeets with last exposure remote in her current home. Previously had mold in her home that was in her bathroom & fixed. Prior exposure to hot tubs but none recently. Pt lives at home with [name], husband.  Has 2 childrean, 12th grade education.       Family History  Problem Relation Age of Onset  . Heart attack Father   . Emphysema Father   . Aneurysm Mother        CEREBRAL  . Emphysema Mother   . Dementia Mother   . Diabetes Son   . Hypertension Son   . Hypertension Son   . Cancer Sister        Widely Metastatic  . Asthma Son        x2  . Breast cancer Paternal Aunt   . Rheum arthritis Maternal Aunt      Review of Systems: All other systems reviewed and are otherwise negative except as noted above.  Physical Exam: Vitals:   08/16/16 0057 08/16/16 0347 08/16/16 0557 08/16/16 0824  BP: 105/75 (!) 119/96 110/65 (!) 134/98  Pulse: (!) 140  (!) 140 (!) 143  Resp: 18  18 (!) 21  Temp:  97.6 F (36.4 C)  98.1 F (36.7 C)  TempSrc:  Oral  Oral  SpO2: 99%  96% 96%  Weight:  135 lb (61.2 kg)    Height:        GEN- The patient somewhat chronically ill appearing, alert and oriented x 3 today.   HEENT: normocephalic, atraumatic; sclera clear, conjunctiva pink; hearing intact; oropharynx clear; neck supple, no JVP Lymph- no cervical lymphadenopathy Lungs-CTA b/l, normal work of breathing.  No wheezes, rales, rhonchi Heart- regular/tachycardic, no murmurs, rubs or gallops, PMI not laterally displaced GI- soft, non-tender, non-distended Extremities- no clubbing, cyanosis, or edema MS- no significant deformity or atrophy Skin- warm and dry, no rash or lesion Psych- euthymic mood, full affect Neuro- no gross deficits observed  Labs:   Lab Results  Component  Value Date   WBC 9.0 08/16/2016   HGB 12.4 08/16/2016   HCT 36.6 08/16/2016   MCV 86.3 08/16/2016   PLT 284 08/16/2016    Recent Labs Lab 08/16/16 0353  NA 133*  K 3.3*  CL 101  CO2 22  BUN 13  CREATININE 1.01*  CALCIUM 8.8*  GLUCOSE 105*      Radiology/Studies:  Dg Chest Portable 1 View Result Date: 08/15/2016 CLINICAL DATA:  Tachycardia beginning 2-3 days ago, history emphysema, COPD, GERD, hypertension EXAM: PORTABLE CHEST 1 VIEW COMPARISON:  Portable exam 1133 hours compared to 08/03/2016 FINDINGS: Intraspinal stimulator lead upper thoracic spine unchanged. Normal heart size, mediastinal contours, and pulmonary vascularity. Lungs hyperinflated consistent with COPD. No pulmonary infiltrate, pleural effusion or pneumothorax. Diffuse osseous demineralization. Prior cervical spine fusion. IMPRESSION: Hyperinflated lungs consistent with history of COPD. No acute abnormalities. Electronically Signed   By: Lavonia Dana M.D.   On: 08/15/2016 11:55    EKG:  AFlutter 136bpm AFlutter 74bpm AFlutter 141bpm 07/12/16: SR 84bpm, 1st degree AVBlock,  PR 252ms, QRS 35ms, QTc 469ms TELEMETRY: AFlutter this AM, 140's, she has had slowing with pauses reported 7 seconds  09/01/15: TTE Study Conclusions - Left ventricle: The cavity size was normal. Wall thickness was normal. Systolic function was normal. The estimated ejection fraction was in the range of 60% to 65%. Wall motion was normal; there were no regional wall motion abnormalities. Doppler parameters are consistent with pseudonormal left ventricular relaxation (grade 2 diastolic dysfunction). The E/e&' ratio is >15, suggesting elevated LV filling pressure. - Mitral valve: Mildly thickened leaflets . There was mild regurgitation. - Left atrium: The atrium was normal in size. - Right ventricle: The cavity size was mildly dilated. Systolic function is reduced. - Right atrium: The atrium was mildly dilated. - Inferior  vena cava: The vessel was dilated. The respirophasic diameter changes were blunted (<50%), consistent with elevated central venous pressure. Impressions: - LVEF 60-65%, normal wall thickness, normal wall motion, stage 2 DD with elevated LV filling pressure, mild MR, normal LA size, mild RAE, dilated RV with reduced systolic function, dilated IVC, no TR.   myoview in 08/2015 by Dr. Oval Linsey showing normal LVEF and no ischemia    Assessment and Plan:   1. AFlutter w/RVR, pauses     Recently started onEliquis, currently heparin gtt     Dr. Lovena Le has seen and discussed with the patient TEE and Aflutter ablation, risks/benefots and she would like to proceed.  Also discsued potential need for PPM implant should she have significant bradycardia, this if needed she is agreeable to as well.   Will try to arrange for today if schedule allows, will hold NPO    Signed, Tommye Standard, PA-C 08/16/2016 9:55 AM  EP Attending  Patient seen and examined. Agree with above. The patient has atrial flutter with a RVR and pauses of up to 7 seconds. These were not post termination pauses. I have discussed the treatment options with the patient and her family. They wish to proceed with EP study and catheter ablation of atrial flutter. There is a possibility that she will also need a PPM and I have also reviewed the risks/benefits/goals/expectations of PPM insertion and she wishes to proceed.   Mikle Bosworth.D.

## 2016-08-16 NOTE — CV Procedure (Signed)
    Transesophageal Echocardiogram Note  RANEEN JAFFER [id number] [birth year]  Procedure: Transesophageal Echocardiogram Indications: Atrial flutter, pre ablation   Procedure Details Consent: Obtained Time Out: Verified patient identification, verified procedure, site/side was marked, verified correct patient position, special equipment/implants available, Radiology Safety Procedures followed,  medications/allergies/relevent history reviewed, required imaging and test results available.  Performed  Medications:  During this procedure the patient is administered a total of Versed 6 mg and Fentanyl 87.5  mcg  to achieve and maintain moderate conscious sedation.  The patient's heart rate, blood pressure, and oxygen saturation are monitored continuously during the procedure. The period of conscious sedation is 30  minutes, of which I was present face-to-face 100% of this time. Technically difficult echo.   Left Ventrical:  Mild LV dysfunction   Mitral Valve: mild MR   Aortic Valve: normal   Tricuspid Valve: trivial TR   Pulmonic Valve: structurally normal   Left Atrium/ Left atrial appendage: no thrombi   Atrial septum:  Poorly visualized   Aorta: not visualized    Complications: No apparent complications Patient did tolerate procedure well.   Thayer Headings, Brooke Bonito., MD, [facility] 08/16/2016, 2:39 PM

## 2016-08-16 NOTE — Consult Note (Signed)
   [facility] CM Inpatient Consult   08/16/2016  Fallen Crisostomo Lipton [birth year] [id number]     Mrs. Sassano is active with [place] Management program. Please see chart review tab then notes for further patient outreach details by [facility] team.  Martin Majestic to bedside to speak with patient, husband, and son. Mrs. Altic was resting during bedside visit. She woke up briefly to speak with Probation officer.   Discussed ongoing [place] Management follow up. Patient and husband are agreeable. Mrs. Carpenito is also active with [place] for PT/RN services.  Offered encouragement and support. Will continue to follow. Will make inpatient RNCM and [place] aware of above.  Contact information provided to family. Mr. Cales and son express appreciation of visit.    Marthenia Rolling, MSN-Ed, RN,BSN [facility] Liaison [phone]

## 2016-08-16 NOTE — Plan of Care (Signed)
Problem: Safety: Goal: Ability to remain free from injury will improve Outcome: Progressing Patient is on bedrest for procedure, call bell with in easy reach, Pt husband and son at bedside.

## 2016-08-16 NOTE — Consult Note (Signed)
ELECTROPHYSIOLOGY CONSULT NOTE    Patient ID: Destiny Hughes MRN: [id number], DOB/AGE: May 13, 1945 71 y.o.  Admit date: 08/15/2016 Date of Consult: 08/16/2016   Primary Physician: Minette Brine Primary Cardiologist: Dr. Oval Linsey Electrophysiologist; Dr. Curt Bears  HPI: Destiny Hughes is a 71 y.o. female who is being seen today for the evaluation of rapid Aflutter, possible tachy-brady syndrome at the request of Dr. Debara Pickett.  PMHx includes HTN, COPD/smoking, HLD, MVP, hypothyroidism, CBP with an implanted nerve stimulator, depression.   She was seen by EP last month during a hospital stay with GI issues (intractable N/V, she was found with acute encephalopathy, hyponatremic, again AKI, notable is that her methadone and oxycodone were discontinued 2/2 confusion by her pain management MD though N/V onset was reported prior to this and stopped in preparation for her neurological evaluation out patient), for an SVT.  At that time in review of her records, it seems c/o palpitations go back to her childhood and as an adult was maintained on Digoxin and propanolol with SB and 1st degree AVblock being described by Dr. Mare Ferrari her prior cardiologist. I see PAT mention in her problem list, cardiology consult note mentions consulted for AFib/SVT.  I note she was hospitalized recently, discharged 07/03/16 for weakness/AMS this was felt to be multifactorial with dehydration, AKI,  though was found bradycardic and her digoxin was stopped and propanolol made to PRN for palpitations only.  Cardiology mentions SB 50's and concerns about possible dig toxicity.  SVT 170's was observed, given her ongoing GI issues/electrolyte imbalance at the time, was decided to f/u out patient for EP/ablation discussion.  She was seen by AFib clinic 08/09/16 noted AFlutter 2:1 conduction, in discussion with Dr. Curt Bears her BB was changed to Toprol  50mg  BID and started on Eliquis which she states she has been compliant with  though only recently started.  She was admitted yesterday with palpitations noting her HR elevated at home persistently (though initially had improved to 90's) withc/o feeling very tired, fatigued.  She was found in AFlutter RVR and started on dilt gtt, she developed slowing with pauses reported of 7 seconds that the patient was lightheaded with, her eliquis changed to heparin gtt pending TEE/DCCBV vs perhaps pacer need.  Last hospitalization there was mention of some degree of bradycardia historically though not appreciated during her stay then back on BB tx.  LABS: K+ 3.3 ( I will replace) BUN/Creat 1.01 WBC 9.0 H/H 12/36 plts 284 TSH 0.024 (last month with SVT 1.230) on replacement poc Trop 0.05   Past Medical History:  Diagnosis Date  . Arthritis 01-25-11   back and most joints  . Blood transfusion    after Thoracic surgery ?'04  . Chronic back pain   . Chronic neck pain   . Collapse of right lung   . COPD (chronic obstructive pulmonary disease) (HCC)    Hx. Bronchitis occ, 01-25-11 somes issue now taking  Z-pak-started 01-24-11/Scar tissue  present  from previous lung collapse  . Depression   . Dyspnea   . Dysrhythmia    hx.PAT- tx. Inderal  . Emphysema of lung ([place])   . GERD (gastroesophageal reflux disease)    tx. TUMS  . Glaucoma   . Headache(784.0) 01-25-11   neck and nerve pain related.  . Hyperlipemia   . Hypothyroidism    tx. Levothyroxine  . Nephrolithiasis   . Neuromuscular disorder ([place]) 01-25-11   Pain/nerve stimulator implanted -2 yrs ago.  . Palpitation   . Tachycardia  Surgical History:  Past Surgical History:  Procedure Laterality Date  . APPENDECTOMY  01-25-11  . CARPAL TUNNEL RELEASE  01-25-11   Bil.  . CERVICAL SPINE SURGERY  01-25-11   2005-multiple levels  . CHOLECYSTECTOMY  01-25-11   '07  . COLONOSCOPY WITH PROPOFOL N/A 07/23/2012   Procedure: COLONOSCOPY WITH PROPOFOL;  Surgeon: Garlan Fair, MD;  Location: [facility];  Service:  Endoscopy;  Laterality: N/A;  . EYE SURGERY    . JOINT REPLACEMENT  01-25-11   6'03 LTHA-hemi  . KNEE ARTHROPLASTY  01-25-11   '04-right, revised x2  . SPINAL CORD STIMULATOR IMPLANT  2009 APPROX   DR. ELSNER  . THORACIC SPINE SURGERY  01-25-11   6'02- then nerve stimulator implanted after  . TOTAL KNEE REVISION  02/01/2011   Procedure: TOTAL KNEE REVISION;  Surgeon: Mauri Pole;  Location: [facility];  Service: Orthopedics;  Laterality: Right;  . TUBAL LIGATION       Prescriptions Prior to Admission  Medication Sig Dispense Refill Last Dose  . acetaminophen (TYLENOL) 325 MG tablet Take 2 tablets (650 mg total) by mouth every 6 (six) hours as needed for headache (pain).   08/13/2016 at Unknown time  . apixaban (ELIQUIS) 5 MG TABS tablet Take 1 tablet (5 mg total) by mouth 2 (two) times daily. 60 tablet 6 08/15/2016 at 0700  . diclofenac sodium (VOLTAREN) 1 % GEL Apply 2 g topically 4 (four) times daily as needed. To right chest area for pain 100 g 0 Past Week at Unknown time  . levothyroxine (SYNTHROID, LEVOTHROID) 150 MCG tablet TAKE 1 TO 1+1/2 TABLETS DAILY AS DIRECTED (Patient taking differently: Take 1 1/2 tablet (225 mcg) by mouth on Tuesday, Thursday before breakfast, take 1 tablet (150 mcg) before breakfast on Sunday, Monday, Wednesday, Friday, Saturday) 135 tablet 1 08/15/2016 at Unknown time  . metoprolol succinate (TOPROL XL) 50 MG 24 hr tablet Take 1 tablet (50 mg total) by mouth 2 (two) times daily. Take with or immediately following a meal. 60 tablet 6 08/15/2016 at 0700  . Multiple Vitamin (MULTIVITAMIN WITH MINERALS) TABS tablet Take 1 tablet by mouth daily. Women's Alive Gummies   08/13/2016 at Unknown time  . pantoprazole (PROTONIX) 40 MG tablet Take 1 tablet (40 mg total) by mouth 2 (two) times daily. 60 tablet 0 08/14/2016 at Unknown time  . sertraline (ZOLOFT) 100 MG tablet Take 100 mg by mouth at bedtime.   08/14/2016 at Unknown time  . promethazine (PHENERGAN) 25 MG tablet Take 25  mg by mouth every 6 (six) hours as needed for nausea or vomiting.   prn    Inpatient Medications:  . levothyroxine  150 mcg Oral Once per day on Tue Thu  . [START ON 08/17/2016] levothyroxine  225 mcg Oral Once per day on Sun Mon Wed Fri Sat  . multivitamin with minerals  1 tablet Oral Daily  . pantoprazole  40 mg Oral BID  . sertraline  100 mg Oral QHS  . sodium chloride flush  3 mL Intravenous Q12H    Allergies:  Allergies  Allergen Reactions  . Albuterol Other (See Comments)    Had difficulty breathing after a nebulizer treatment  . Penicillins Anaphylaxis and Swelling    Has patient had a PCN reaction causing immediate rash, facial/tongue/throat swelling, SOB or lightheadedness with hypotension: Yes Has patient had a PCN reaction causing severe rash involving mucus membranes or skin necrosis: Rash Has patient had a PCN reaction that required hospitalization: No  Has patient had a PCN reaction occurring within the last 10 years: No If all of the above answers are "NO", then may proceed with Cephalosporin use.  [name] [Kdc:Albuterol] Shortness Of Breath  . Sulfa Antibiotics Swelling    Throat swells, but no shortness of breath noted  . Zanaflex [Tizanidine] Other (See Comments)    Possible confusion (??)  . Codeine Nausea And Vomiting  . Ecotrin [Aspirin] Nausea And Vomiting    Social History   Social History  . Marital status: Married    Spouse name: N/A  . Number of children: 2  . Years of education: N/A   Occupational History  . disabled    Social History Main Topics  . Smoking status: Current Every Day Smoker    Packs/day: 0.75    Years: 50.00    Types: Cigarettes    Start date: 09/13/1961  . Smokeless tobacco: Never Used     Comment: Peak rate of 1ppd  . Alcohol use No     Comment: none in 10 years  . Drug use: No  . Sexual activity: No   Other Topics Concern  . Not on file   Social History Narrative   She is [place] [place]. She has always lived in [place]. She  has traveled to [place], [place], [place], [place], & [place]. 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 Prior exposure to parakeets with last exposure remote in her current home. Previously had mold in her home that was in her bathroom & fixed. Prior exposure to hot tubs but none recently. Pt lives at home with [name], husband.  Has 2 childrean, 12th grade education.       Family History  Problem Relation Age of Onset  . Heart attack Father   . Emphysema Father   . Aneurysm Mother        CEREBRAL  . Emphysema Mother   . Dementia Mother   . Diabetes Son   . Hypertension Son   . Hypertension Son   . Cancer Sister        Widely Metastatic  . Asthma Son        x2  . Breast cancer Paternal Aunt   . Rheum arthritis Maternal Aunt      Review of Systems: All other systems reviewed and are otherwise negative except as noted above.  Physical Exam: Vitals:   08/16/16 0057 08/16/16 0347 08/16/16 0557 08/16/16 0824  BP: 105/75 (!) 119/96 110/65 (!) 134/98  Pulse: (!) 140  (!) 140 (!) 143  Resp: 18  18 (!) 21  Temp:  97.6 F (36.4 C)  98.1 F (36.7 C)  TempSrc:  Oral  Oral  SpO2: 99%  96% 96%  Weight:  135 lb (61.2 kg)    Height:        GEN- The patient somewhat chronically ill appearing, alert and oriented x 3 today.   HEENT: normocephalic, atraumatic; sclera clear, conjunctiva pink; hearing intact; oropharynx clear; neck supple, no JVP Lymph- no cervical lymphadenopathy Lungs-CTA b/l, normal work of breathing.  No wheezes, rales, rhonchi Heart- regular/tachycardic, no murmurs, rubs or gallops, PMI not laterally displaced GI- soft, non-tender, non-distended Extremities- no clubbing, cyanosis, or edema MS- no significant deformity or atrophy Skin- warm and dry, no rash or lesion Psych- euthymic mood, full affect Neuro- no gross deficits observed  Labs:   Lab Results  Component  Value Date   WBC 9.0 08/16/2016   HGB 12.4 08/16/2016   HCT 36.6 08/16/2016   MCV 86.3 08/16/2016   PLT 284 08/16/2016    Recent Labs Lab 08/16/16 0353  NA 133*  K 3.3*  CL 101  CO2 22  BUN 13  CREATININE 1.01*  CALCIUM 8.8*  GLUCOSE 105*      Radiology/Studies:  Dg Chest Portable 1 View Result Date: 08/15/2016 CLINICAL DATA:  Tachycardia beginning 2-3 days ago, history emphysema, COPD, GERD, hypertension EXAM: PORTABLE CHEST 1 VIEW COMPARISON:  Portable exam 1133 hours compared to 08/03/2016 FINDINGS: Intraspinal stimulator lead upper thoracic spine unchanged. Normal heart size, mediastinal contours, and pulmonary vascularity. Lungs hyperinflated consistent with COPD. No pulmonary infiltrate, pleural effusion or pneumothorax. Diffuse osseous demineralization. Prior cervical spine fusion. IMPRESSION: Hyperinflated lungs consistent with history of COPD. No acute abnormalities. Electronically Signed   By: Lavonia Dana M.D.   On: 08/15/2016 11:55    EKG:  AFlutter 136bpm AFlutter 74bpm AFlutter 141bpm 07/12/16: SR 84bpm, 1st degree AVBlock,  PR 245ms, QRS 37ms, QTc 418ms TELEMETRY: AFlutter this AM, 140's, she has had slowing with pauses reported 7 seconds  09/01/15: TTE Study Conclusions - Left ventricle: The cavity size was normal. Wall thickness was normal. Systolic function was normal. The estimated ejection fraction was in the range of 60% to 65%. Wall motion was normal; there were no regional wall motion abnormalities. Doppler parameters are consistent with pseudonormal left ventricular relaxation (grade 2 diastolic dysfunction). The E/e&' ratio is >15, suggesting elevated LV filling pressure. - Mitral valve: Mildly thickened leaflets . There was mild regurgitation. - Left atrium: The atrium was normal in size. - Right ventricle: The cavity size was mildly dilated. Systolic function is reduced. - Right atrium: The atrium was mildly dilated. - Inferior  vena cava: The vessel was dilated. The respirophasic diameter changes were blunted (<50%), consistent with elevated central venous pressure. Impressions: - LVEF 60-65%, normal wall thickness, normal wall motion, stage 2 DD with elevated LV filling pressure, mild MR, normal LA size, mild RAE, dilated RV with reduced systolic function, dilated IVC, no TR.   myoview in 08/2015 by Dr. Oval Linsey showing normal LVEF and no ischemia    Assessment and Plan:   1. AFlutter w/RVR, pauses     Recently started onEliquis, currently heparin gtt     Dr. Lovena Le has seen and discussed with the patient TEE and Aflutter ablation, risks/benefots and she would like to proceed.  Also discsued potential need for PPM implant should she have significant bradycardia, this if needed she is agreeable to as well.   Will try to arrange for today if schedule allows, will hold NPO    Signed, Tommye Standard, PA-C 08/16/2016 9:55 AM  EP Attending  Patient seen and examined. Agree with above. The patient has atrial flutter with a RVR and pauses of up to 7 seconds. These were not post termination pauses. I have discussed the treatment options with the patient and her family. They wish to proceed with EP study and catheter ablation of atrial flutter. There is a possibility that she will also need a PPM and I have also reviewed the risks/benefits/goals/expectations of PPM insertion and she wishes to proceed.   Mikle Bosworth.D.

## 2016-08-16 NOTE — Interval H&P Note (Signed)
History and Physical Interval Note:  08/16/2016 2:45 PM  Destiny Hughes  has presented today for surgery, with the diagnosis of aflutter  The various methods of treatment have been discussed with the patient and family. After consideration of risks, benefits and other options for treatment, the patient has consented to  Procedure(s): A-Flutter Ablation (N/A) as a surgical intervention .  The patient's history has been reviewed, patient examined, no change in status, stable for surgery.  I have reviewed the patient's chart and labs.  Questions were answered to the patient's satisfaction.     Mertie Moores

## 2016-08-17 ENCOUNTER — Telehealth (HOSPITAL_COMMUNITY): Payer: Self-pay | Admitting: *Deleted

## 2016-08-17 ENCOUNTER — Encounter (HOSPITAL_COMMUNITY): Payer: Self-pay | Admitting: Internal Medicine

## 2016-08-17 DIAGNOSIS — I442 Atrioventricular block, complete: Secondary | ICD-10-CM

## 2016-08-17 LAB — HEPARIN LEVEL (UNFRACTIONATED)

## 2016-08-17 LAB — APTT: APTT: 24 s (ref 24–36)

## 2016-08-17 MED ORDER — APIXABAN 5 MG PO TABS
5.0000 mg | ORAL_TABLET | Freq: Two times a day (BID) | ORAL | Status: DC
  Administered 2016-08-17: 5 mg via ORAL
  Filled 2016-08-17: qty 1

## 2016-08-17 MED ORDER — MORPHINE SULFATE (PF) 2 MG/ML IV SOLN
1.0000 mg | Freq: Once | INTRAVENOUS | Status: AC
  Administered 2016-08-17: 1 mg via INTRAVENOUS
  Filled 2016-08-17: qty 1

## 2016-08-17 MED ORDER — LEVOTHYROXINE SODIUM 150 MCG PO TABS: ORAL_TABLET | ORAL | 1 refills | Status: DC

## 2016-08-17 NOTE — Discharge Summary (Signed)
[facility]     ELECTROPHYSIOLOGY PROCEDURE DISCHARGE SUMMARY    Patient ID: Destiny Hughes,  MRN: [id number], DOB/AGE: 11/02/1945 71 y.o.  Admit date: 08/15/2016 Discharge date: 08/17/2016  Primary Care Physician: Minette Brine  Primary Cardiologist: Dr. Oval Linsey Electrophysiologist: Dr. Curt Bears  Primary Discharge Diagnosis:  1. Atrial Flutter w/RVR     CHA2DS2Vasc is 3, on Eliquis  Secondary Discharge Diagnosis:  1. SVT 2. COPD 3. HTN 4. Hypothyroidism  Allergies  Allergen Reactions  . Albuterol Other (See Comments)    Had difficulty breathing after a nebulizer treatment  . Penicillins Anaphylaxis and Swelling    Has patient had a PCN reaction causing immediate rash, facial/tongue/throat swelling, SOB or lightheadedness with hypotension: Yes Has patient had a PCN reaction causing severe rash involving mucus membranes or skin necrosis: Rash Has patient had a PCN reaction that required hospitalization: No Has patient had a PCN reaction occurring within the last 10 years: No If all of the above answers are "NO", then may proceed with Cephalosporin use.  [name] [Kdc:Albuterol] Shortness Of Breath  . Sulfa Antibiotics Swelling    Throat swells, but no shortness of breath noted  . Zanaflex [Tizanidine] Other (See Comments)    Possible confusion (??)  . Codeine Nausea And Vomiting  . Ecotrin [Aspirin] Nausea And Vomiting     Procedures This Admission: 1.  Electrophysiology study and radiofrequency catheter ablation on 08/16/16 by Dr Lovena Le.   CONCLUSIONS:  1. Isthmus-dependent right atrial flutter upon presentation.  2. Successful radiofrequency ablation of atrial flutter along the cavotricuspid isthmus with complete bidirectional isthmus block achieved.  3. Successful catheter ablation of inducible AVNRT 4. No inducible arrhythmias following ablation.  5. The patient had transient AV block after the ablation which resolved and did not return.    No early apparent  complications.   2. TEE 08/16/16: Dr. Acie Fredrickson Left Ventrical:  Mild LV dysfunction  Mitral Valve: mild MR  Aortic Valve: normal  Tricuspid Valve: trivial TR  Pulmonic Valve: structurally normal  Left Atrium/ Left atrial appendage: no thrombi  Atrial septum:  Poorly visualized  Aorta: not visualized    Complications: No apparent complications Patient did tolerate procedure well.  Brief HPI: Destiny Hughes is a 71 y.o. female with a past medical history as outlined above.  Admitted to [facility] with persistent tachy-palpitations and increasing fatigue, noted to be in rapid AFlutter, she was admitted for care and management  Hospital Course:  Initially treated with Diltiazem gtt that resulted in pauses, her diltiazem and toprol held, she c/w rapid AFlutter and EP was asked to evaluate, for management options.  SHe had been seenin AFib clinic a few days prior to her admission and started on Eliquis for a/c, she underwent TEE without thrombus followed by EPS/ablation procedure, with details as noted above.   She was monitored on telemetry overnight which demonstrated SR 70's-80';s 1st degree AVblock with infrequent blocked PACs, no high AVblock, no bradycardia.  Groin incisions were without complication.  She was examined by Dr Lovena Le who considered her stable for discharge to home. He recommends staying off BB/nodal blocking agents with routine post ablation f/u, her BP has been OK here, off medications, and for now, will monitor her BP, a BP visit has been arranged for the patient.  Follow up with Dr. Lovena Le has been arranged in 4 weeks.  Wound care and restrictions were reviewed with the patient prior to discharge. The patient was already getting home health services and these will be  resumed.  Physical Exam: Vitals:   08/17/16 0400 08/17/16 0546 08/17/16 0754 08/17/16 0811  BP:  108/63 121/63   Pulse:  67 70 69  Resp:  16 17 20   Temp:  97.7 F (36.5 C) 98.1 F (36.7 C)   TempSrc:  Oral  Oral   SpO2:  93% 94% 97%  Weight: 131 lb 4.8 oz (59.6 kg)     Height: 5\' 6"  (1.676 m)       GEN- The patient is well appearing, alert and oriented x 3 today.   HEENT: normocephalic, atraumatic; sclera clear, conjunctiva pink; hearing intact; oropharynx clear; neck supple, no JVP Lymph- no cervical lymphadenopathy Lungs- CTA b/l, normal work of breathing.  No wheezes, rales, rhonchi Heart- RRR, no murmurs, rubs or gallops, PMI not laterally displaced GI- soft, non-tender, non-distended, bowel sounds present, no hepatosplenomegaly Extremities- no clubbing, cyanosis, or edema; DP/PT/radial pulses 2+ bilaterally, groin without hematoma/bruit MS- no significant deformity or atrophy Skin- warm and dry, no rash or lesion Psych- euthymic mood, full affect Neuro- strength and sensation are intact   Labs:   Lab Results  Component Value Date   WBC 9.0 08/16/2016   HGB 12.4 08/16/2016   HCT 36.6 08/16/2016   MCV 86.3 08/16/2016   PLT 284 08/16/2016     Recent Labs Lab 08/16/16 0353  NA 133*  K 3.3*  CL 101  CO2 22  BUN 13  CREATININE 1.01*  CALCIUM 8.8*  GLUCOSE 105*    Discharge Medications:  Allergies as of 08/17/2016      Reactions   Albuterol Other (See Comments)   Had difficulty breathing after a nebulizer treatment   Penicillins Anaphylaxis, Swelling   Has patient had a PCN reaction causing immediate rash, facial/tongue/throat swelling, SOB or lightheadedness with hypotension: Yes Has patient had a PCN reaction causing severe rash involving mucus membranes or skin necrosis: Rash Has patient had a PCN reaction that required hospitalization: No Has patient had a PCN reaction occurring within the last 10 years: No If all of the above answers are "NO", then may proceed with Cephalosporin use.   Ventolin [kdc:albuterol] Shortness Of Breath   Sulfa Antibiotics Swelling   Throat swells, but no shortness of breath noted   Zanaflex [tizanidine] Other (See Comments)    Possible confusion (??)   Codeine Nausea And Vomiting   Ecotrin [aspirin] Nausea And Vomiting      Medication List    STOP taking these medications   metoprolol succinate 50 MG 24 hr tablet Commonly known as:  TOPROL XL     TAKE these medications   acetaminophen 325 MG tablet Commonly known as:  TYLENOL Take 2 tablets (650 mg total) by mouth every 6 (six) hours as needed for headache (pain).   apixaban 5 MG Tabs tablet Commonly known as:  ELIQUIS Take 1 tablet (5 mg total) by mouth 2 (two) times daily.   diclofenac sodium 1 % Gel Commonly known as:  VOLTAREN Apply 2 g topically 4 (four) times daily as needed. To right chest area for pain   levothyroxine 150 MCG tablet Commonly known as:  SYNTHROID, LEVOTHROID Take 1 1/2 tablet (225 mcg) by mouth on Tuesday, Thursday before breakfast, take 1 tablet (150 mcg) before breakfast on Sunday, Monday, Wednesday, Friday, Saturday   multivitamin with minerals Tabs tablet Take 1 tablet by mouth daily. Women's Alive Gummies   pantoprazole 40 MG tablet Commonly known as:  PROTONIX Take 1 tablet (40 mg total) by mouth 2 (two)  times daily.   promethazine 25 MG tablet Commonly known as:  PHENERGAN Take 25 mg by mouth every 6 (six) hours as needed for nausea or vomiting.   sertraline 100 MG tablet Commonly known as:  ZOLOFT Take 100 mg by mouth at bedtime.       Disposition: Home Discharge Instructions    Diet - low sodium heart healthy    Complete by:  As directed    Increase activity slowly    Complete by:  As directed      Follow-up Information    [facility] Follow up on 08/31/2016.   Specialty:  Cardiology Why:  BP evaluation visit Contact information: [address], [address] [place]       Evans Lance, MD Follow up on 09/26/2016.   Specialty:  Cardiology Why:  12:15PM Contact information: [address]  [address] [phone]           Duration of Discharge Encounter: Greater than 30 minutes including physician time.  Venetia Night, PA-C 08/17/2016 9:13 AM   EP Attending  Patient seen and examined. Agree with above. She is doing well after catheter ablation though she c/o some dizziness. No change in blood pressure. I would like her to ambulate in halls and if ok she can go home with usual followup.  Mikle Bosworth.D.

## 2016-08-17 NOTE — Plan of Care (Signed)
Problem: Safety: Goal: Ability to remain free from injury will improve Outcome: Progressing Patient calls for assistance, bed alarm maintained.   Problem: Pain Managment: Goal: General experience of comfort will improve Outcome: Progressing One time Morphine ordered for headache, given with relief. Tylenol given for second time for repeat headache.   Problem: Tissue Perfusion: Goal: Risk factors for ineffective tissue perfusion will decrease Outcome: Progressing Patient has remained SR w/ 1 Degree AV Block.   Problem: Activity: Goal: Risk for activity intolerance will decrease Outcome: Progressing Once bedrest ended at 2330, patient up to bedside commode. Steady getting out of bed. Some tenderness to right groin area, relieved with tylenol.

## 2016-08-17 NOTE — Progress Notes (Signed)
Pt and family made aware that the discharge can be held until tomorrow. Pt and family feel however at this point that symptoms of dizziness and headache are resolving to the point where they are able to go home. Cardiac monitor and IV's d/c'd at this time. Will d/c to home once education completed. Verbalized understanding. No further questions or concerns expressed.

## 2016-08-17 NOTE — Telephone Encounter (Signed)
Pt currently admitted.

## 2016-08-17 NOTE — Progress Notes (Signed)
RN called, patient became dizzy when she got OOB with reported HR 150, tele is reviewed, is ST, no SVT/ AFlutter, no bradycardia.  The patient denied feeling near syncopal, said she got up from a supine position and became transiently lightheaded.  She denies any symptoms.  D/W RN, keep OOB to chair, likely was briefly orthostatic w/reactive ST.  BP back in bed was reported 120/60.  Ambulate after lunch, if without symptoms and VSS, procedure site remains stable, OK to discharge.  Tommye Standard, PA-C  Mikle Bosworth.D.

## 2016-08-17 NOTE — Care Management Note (Signed)
Case Management Note  Patient Details  Name: Destiny Hughes MRN: [id number] Date of Birth: 1945-11-22  Subjective/Objective:    Pt presented for SVT-post ablation. Pt is from home with husband. Pt was previously active with [facility] for RN, PT, SW Aide. [facility] is aware that pt is hospitalized. Plan for d/c once stable.                 Action/Plan: Resumption orders obtained and SOC to begin within 24-48 hours post d/c. NO further needs from CM at this time.   Expected Discharge Date:  08/17/16               Expected Discharge Plan:  [place]  In-House Referral:  NA  Discharge planning Services  CM Consult  Post Acute Care Choice:  Home Health, Resumption of Svcs/PTA Provider Choice offered to:  Patient, Spouse  DME Arranged:  N/A DME Agency:  NA  HH Arranged:  RN, Social Work, PT, Nurse's Aide [place] Agency:  [facility]  Status of Service:  Completed, signed off  If discussed at [employer], dates discussed:    Additional Comments:  Bethena Roys, RN 08/17/2016, 11:02 AM

## 2016-08-17 NOTE — Progress Notes (Signed)
Patient reported dizziness and headache.  She has had negative orthostatic VS check, and has ambulated. Dizziness is reported as a sensation of motion, not lightheaded, no near syncope.  In further discussion with patient and family.  She has hx of severe back/neck pain, particularly C spine pain that has been felt to be the etiology her headache, a month ago prior to her hospital stay last month was taken off her pain medicines for neuro evaluation to evaluate these though ended up hospitalized without getting seen.  She has an appointment to see neuro next week for evaluation.  The patient's family feels reluctant for her to go home today, feels like observation tonight and better rest since last night she slept the best she has in weeks, they would feel better about her going home tomorrow.  Given she has home health care arranged feel she can go home with thi support and keep her planned f/u.  They remain hesitant and prefer she stay.  We will plan for discharge tomorrow morning.  I have made Dr. Lovena Le aware.  Her VSS, and telemetry remains sinus rhythm.  Tommye Standard, PA-C

## 2016-08-17 NOTE — Plan of Care (Signed)
Problem: Cardiac: Goal: Ability to achieve and maintain adequate cardiopulmonary perfusion will improve Outcome: Progressing S/P SVT ablation. HR = 80's in SR with 1st degree HB. Increase activity OOB.

## 2016-08-17 NOTE — Progress Notes (Signed)
CARDIAC REHAB PHASE I   PRE:  Rate/Rhythm: 84 SR    BP: sitting 110/65, standing 115/67, sitting and standing again 113/71    SaO2: 97 RA  MODE:  Ambulation: 50 ft   POST:  Rate/Rhythm: 124 ST, x1 missed beat/pause    BP: sitting 127/57     SaO2: 97 RA  Pt c/o significant dizziness with standing, could not stand for BP to be taken. Sat again and BP 115/67. Let pt rest then stood again, BP 113/71. C/o some dizziness but mostly a significant HA. Pt wobbly on her feet and we used gait belt, RW, assist x2. Able to walk in hall short distance which is about her norm. HR up to 124 ST at max. Pt continued to c/o HA, even after walking. Family present who sts she does not do much at home, walks around house. Husband is not able to get around well himself due to sciatic pain. Discussed with RN. They have neuro appt next week.   [place], ACSM 08/17/2016 3:17 PM

## 2016-08-17 NOTE — Discharge Instructions (Signed)
No driving for 1 4 days. No lifting over 5 lbs for 1 week. No vigorous or sexual activity for 1 week. You may return to work on 08/24/16. Keep procedure site clean & dry. If you notice increased pain, swelling, bleeding or pus, call/return!  You may shower, but no soaking baths/hot tubs/pools for 1 week.

## 2016-08-18 ENCOUNTER — Encounter: Payer: Self-pay | Admitting: *Deleted

## 2016-08-18 ENCOUNTER — Other Ambulatory Visit: Payer: Self-pay | Admitting: *Deleted

## 2016-08-18 DIAGNOSIS — M542 Cervicalgia: Secondary | ICD-10-CM | POA: Diagnosis not present

## 2016-08-18 DIAGNOSIS — I110 Hypertensive heart disease with heart failure: Secondary | ICD-10-CM | POA: Diagnosis not present

## 2016-08-18 DIAGNOSIS — E785 Hyperlipidemia, unspecified: Secondary | ICD-10-CM | POA: Diagnosis not present

## 2016-08-18 DIAGNOSIS — J449 Chronic obstructive pulmonary disease, unspecified: Secondary | ICD-10-CM | POA: Diagnosis not present

## 2016-08-18 DIAGNOSIS — G894 Chronic pain syndrome: Secondary | ICD-10-CM | POA: Diagnosis not present

## 2016-08-18 DIAGNOSIS — I5030 Unspecified diastolic (congestive) heart failure: Secondary | ICD-10-CM | POA: Diagnosis not present

## 2016-08-18 NOTE — Patient Outreach (Signed)
[facility]) Care Management [place] Telephone Outreach, Transition of Care day 1  08/18/2016  Destiny Hughes 1945/05/13 [id number]  Successful telephone outreach to Destiny Hughes, 71 y/o female currently active with [facility] CM.  Patient experienced hospital re-admission May 28-30, 2018 for palpitations and was found to be in A-Flutter with RVR, and had TEE with cardiac ablation 08/16/16.  Patient has history including, but not limited to, MVP, SVT with A-fib flutter, dCHF, HTN, HLD, COPD, GERD, and chronic osteoarthritis pain.   HIPAA/ identity verified with patient's spouse/ primary caregiver (on [facility] CM written consent) during phone call today, and I explained to patient's husband that I was covering for patient's primary [facility] RN [place], Destiny Hughes.  Today, patient's husband reports that patient is "holding steady and doing pretty good."  Reports that he has been checking patient's blood pressures and heart rates since her discharge home yesterday, and states, "they are all in a good range."  Medications: -- has all medications and is taking as prescribed post-hospital discharge -- verbalizes accurate understanding of medication changes made at hospital at time of discharge home; verifies that patient is no longer taking metoprolol -- family continues preparing/ administering patient's medications using weekly pill box -- husband verbalizes good general understanding of purpose, dosing, and scheduling of patient's medications -- patient was recently discharged from hospital and all medications were thoroughly reviewed with patient's husband during phone call today.  Home health [facility]) services: -- [place] services for RN through [facility] were in place at time of patient's admission, and husband verifies that they have continued -- expects [facility] RN "to arrive any minute now," stating that she previously scheduled visit with husband.  Provider appointments: --  husband provides accurate report of scheduled upcoming provider appointments compared to EMR -- Neuro provider appointment Monday 08/22/16 "for memory issues" -- cardiology appointments 08/31/16 for pharmacy and 09/05/16 for office visit -- Next PCP office visit "in July 2018" -- husband will transport patient to all scheduled appointments  Safety/ Mobility/ Falls: -- denies new falls, general fall risks/ prevention discussed with patient's husband today  Holiday representative needs: -- husband reports good family support system, with local son who assists in patient care needs as needed and is stable/ available -- denies additional community resource needs today   Self-health management of CHF, A-Fib, COPD:   -- Husband reports patient does not regularly weigh herself -- No current issues with breathing; is on room air without supplement -- Husband regularly monitors patient's BP and HR  Patient's husband denies further issues, concerns, or problems today and again states that everything is "under control."  I provided patient's husband with the main [facility] CM office phone number, and the [facility] CM 24-hour nurse advice phone number should issues arise prior to next scheduled [place] outreach, by primary [facility] RN CM Destiny Hughes.  Plan:  Will update patient's primary [facility] RN CM of today's successful telephone outreach for transition of care post-hospital discharge.  Oneta Rack, RN, BSN, [employer] [facility] Care Management  [phone]

## 2016-08-22 ENCOUNTER — Ambulatory Visit (INDEPENDENT_AMBULATORY_CARE_PROVIDER_SITE_OTHER): Payer: Medicare Other | Admitting: Diagnostic Neuroimaging

## 2016-08-22 ENCOUNTER — Encounter: Payer: Self-pay | Admitting: Diagnostic Neuroimaging

## 2016-08-22 VITALS — BP 146/93 | HR 84 | Ht 66.0 in | Wt 129.5 lb

## 2016-08-22 DIAGNOSIS — I6529 Occlusion and stenosis of unspecified carotid artery: Secondary | ICD-10-CM | POA: Diagnosis not present

## 2016-08-22 DIAGNOSIS — R410 Disorientation, unspecified: Secondary | ICD-10-CM

## 2016-08-22 DIAGNOSIS — G8929 Other chronic pain: Secondary | ICD-10-CM

## 2016-08-22 DIAGNOSIS — M542 Cervicalgia: Secondary | ICD-10-CM | POA: Diagnosis not present

## 2016-08-22 DIAGNOSIS — M25561 Pain in right knee: Secondary | ICD-10-CM | POA: Diagnosis not present

## 2016-08-22 DIAGNOSIS — F329 Major depressive disorder, single episode, unspecified: Secondary | ICD-10-CM

## 2016-08-22 DIAGNOSIS — F32A Depression, unspecified: Secondary | ICD-10-CM

## 2016-08-22 DIAGNOSIS — G894 Chronic pain syndrome: Secondary | ICD-10-CM | POA: Diagnosis not present

## 2016-08-22 NOTE — Patient Instructions (Signed)
Thank you for coming to see [place] at [facility]. I hope we have been able to provide you high quality care today.  You may receive a patient satisfaction survey over the next few weeks. We would appreciate your feedback and comments so that we may continue to improve ourselves and the health of our patients.   - treat underlying medical issues  - treat underlying pain and depression issues  - improve nutrition, physical activity, cognitive stimulating activities  - symptoms should gradually improve over time    ~~~~~~~~~~~~~~~~~~~~~~~~~~~~~~~~~~~~~~~~~~~~~~~~~~~~~~~~~~~~~~~~~  DR. Rayla Pember'S GUIDE TO HAPPY AND HEALTHY LIVING These are some of my general health and wellness recommendations. Some of them may apply to you better than others. Please use common sense as you try these suggestions and feel free to ask me any questions.   ACTIVITY/FITNESS Mental, social, emotional and physical stimulation are very important for brain and body health. Try learning a new activity (arts, music, language, sports, games).  Keep moving your body to the best of your abilities. You can do this at home, inside or outside, the park, community center, gym or anywhere you like. Consider a physical therapist or personal trainer to get started. Consider the app Sworkit. Fitness trackers such as smart-watches, smart-phones or Fitbits can help as well.   NUTRITION Eat more plants: colorful vegetables, nuts, seeds and berries.  Eat less sugar, salt, preservatives and processed foods.  Avoid toxins such as cigarettes and alcohol.  Drink water when you are thirsty. Warm water with a slice of lemon is an excellent morning drink to start the day.  Consider these websites for more information The Nutrition Source (https://www.henry-hernandez.biz/) Precision Nutrition (WindowBlog.ch)   RELAXATION Consider practicing mindfulness meditation or  other relaxation techniques such as deep breathing, prayer, yoga, tai chi, massage. See website mindful.org or the apps Headspace or Calm to help get started.   SLEEP Try to get at least 7-8+ hours sleep per day. Regular exercise and reduced caffeine will help you sleep better. Practice good sleep hygeine techniques. See website sleep.org for more information.   PLANNING Prepare estate planning, living will, healthcare POA documents. Sometimes this is best planned with the help of an attorney. Theconversationproject.org and agingwithdignity.org are excellent resources.

## 2016-08-22 NOTE — Progress Notes (Signed)
[facility]  PATIENT: Destiny Hughes DOB: 04/12/1945  REFERRING CLINICIAN: Othelia Pulling, NP HISTORY FROM: patient, husband, son REASON FOR VISIT: follow up   HISTORICAL  CHIEF COMPLAINT:  Chief Complaint  Patient presents with  . Hospitalization Follow-up    Terrible H/A since Hosp D/C    HISTORY OF PRESENT ILLNESS:    UPDATE 08/22/16: Since last visit, went to ER for admission 07/01/16-07/03/16, found to have AKI, dehydration. Then readmitted on 07/08/16-07/14/16 for a fall, AKI, hyponatremia. Then back to hosp 08/15/16-08/17/16 for palpitations, then dx with afib, treated with RF ablation. Now here for follow up of memory loss problem. Now having worsening PO intake. Patient not remembering to bathe, eat, dress properly.   PRIOR HPI (07/01/16): 71 year old female here for evaluation of memory loss. Patient has history of hypertension, COPD, hyperlipidemia, hypothyroidism, chronic pain, depression. Patient has been on chronic narcotic medications for many years, most recently on oxycodone and methadone, which was discontinued 1 week ago. In 06-14-2016 patient's sister passed away, and patient had significant grieving, depression and anxiety. 05/26/2016 patient had left cataract surgery and following this patient had progressively increasing confusion, nausea, vomiting, decreased by mouth intake, abdominal pain, lethargy and confusion. Patient went to the emergency room on 06/02/16, had CT scan of the head, CT of abdomen and pelvis, lab testing, was diagnosed with gastritis versus opiate withdrawal. Symptoms progressively worsened over the past month. She has had continued nausea, vomiting, confusion, lethargy, weight loss. Patient saw PCP who referred patient to me for evaluation of possible underlying cognitive disorder. Today patient is in a wheelchair, having significant vomiting in the office. She is lethargic, poorly responsive, has low blood pressure of 78/54. She was  able to complete Mini-Mental status testing and scored 20 out of 30. She follows some commands. She is able to respond verbally as well.    REVIEW OF SYSTEMS: Full 14 system review of systems performed and negative with exception of: dizziness headache confusion depression wheezing trouble swallowing neck pain diarrhea.    ALLERGIES: Allergies  Allergen Reactions  . Albuterol Other (See Comments)    Had difficulty breathing after a nebulizer treatment  . Penicillins Anaphylaxis and Swelling    Has patient had a PCN reaction causing immediate rash, facial/tongue/throat swelling, SOB or lightheadedness with hypotension: Yes Has patient had a PCN reaction causing severe rash involving mucus membranes or skin necrosis: Rash Has patient had a PCN reaction that required hospitalization: No Has patient had a PCN reaction occurring within the last 10 years: No If all of the above answers are "NO", then may proceed with Cephalosporin use.  [name] [Kdc:Albuterol] Shortness Of Breath  . Sulfa Antibiotics Swelling    Throat swells, but no shortness of breath noted  . Zanaflex [Tizanidine] Other (See Comments)    Possible confusion (??)  . Codeine Nausea And Vomiting  . Ecotrin [Aspirin] Nausea And Vomiting    HOME MEDICATIONS: Outpatient Medications Prior to Visit  Medication Sig Dispense Refill  . acetaminophen (TYLENOL) 325 MG tablet Take 2 tablets (650 mg total) by mouth every 6 (six) hours as needed for headache (pain).    Marland Kitchen apixaban (ELIQUIS) 5 MG TABS tablet Take 1 tablet (5 mg total) by mouth 2 (two) times daily. 60 tablet 6  . diclofenac sodium (VOLTAREN) 1 % GEL Apply 2 g topically 4 (four) times daily as needed. To right chest area for pain 100 g 0  . levothyroxine (SYNTHROID, LEVOTHROID) 150 MCG tablet Take  1 1/2 tablet (225 mcg) by mouth on Tuesday, Thursday before breakfast, take 1 tablet (150 mcg) before breakfast on Sunday, Monday, Wednesday, Friday, Saturday 135 tablet 1  .  Multiple Vitamin (MULTIVITAMIN WITH MINERALS) TABS tablet Take 1 tablet by mouth daily. Women's Alive Gummies    . pantoprazole (PROTONIX) 40 MG tablet Take 1 tablet (40 mg total) by mouth 2 (two) times daily. 60 tablet 0  . promethazine (PHENERGAN) 25 MG tablet Take 25 mg by mouth every 6 (six) hours as needed for nausea or vomiting.    . sertraline (ZOLOFT) 100 MG tablet Take 100 mg by mouth at bedtime.     No facility-administered medications prior to visit.     PAST MEDICAL HISTORY: Past Medical History:  Diagnosis Date  . Arthritis 01-25-11   back and most joints  . Blood transfusion    after Thoracic surgery ?'04  . Chronic back pain   . Chronic neck pain   . Collapse of right lung   . COPD (chronic obstructive pulmonary disease) (HCC)    Hx. Bronchitis occ, 01-25-11 somes issue now taking  Z-pak-started 01-24-11/Scar tissue  present  from previous lung collapse  . Depression   . Dyspnea   . Dysrhythmia    hx.PAT- tx. Inderal  . Emphysema of lung ([place])   . GERD (gastroesophageal reflux disease)    tx. TUMS  . Glaucoma   . Headache(784.0) 01-25-11   neck and nerve pain related.  . Hyperlipemia   . Hypothyroidism    tx. Levothyroxine  . Nephrolithiasis   . Neuromuscular disorder ([place]) 01-25-11   Pain/nerve stimulator implanted -2 yrs ago.  . Palpitation   . Tachycardia     PAST SURGICAL HISTORY: Past Surgical History:  Procedure Laterality Date  . A-FLUTTER ABLATION N/A 08/16/2016   Procedure: A-Flutter Ablation;  Surgeon: Evans Lance, MD;  Location: [facility];  Service: Cardiovascular;  Laterality: N/A;  . APPENDECTOMY  01-25-11  . CARPAL TUNNEL RELEASE  01-25-11   Bil.  . CERVICAL SPINE SURGERY  01-25-11   2005-multiple levels  . CHOLECYSTECTOMY  01-25-11   '07  . COLONOSCOPY WITH PROPOFOL N/A 07/23/2012   Procedure: COLONOSCOPY WITH PROPOFOL;  Surgeon: Garlan Fair, MD;  Location: [facility];  Service: Endoscopy;  Laterality: N/A;  . EYE SURGERY      . JOINT REPLACEMENT  01-25-11   6'03 LTHA-hemi  . KNEE ARTHROPLASTY  01-25-11   '04-right, revised x2  . SPINAL CORD STIMULATOR IMPLANT  2009 APPROX   DR. ELSNER  . THORACIC SPINE SURGERY  01-25-11   6'02- then nerve stimulator implanted after  . TOTAL KNEE REVISION  02/01/2011   Procedure: TOTAL KNEE REVISION;  Surgeon: Mauri Pole;  Location: [facility];  Service: Orthopedics;  Laterality: Right;  . TUBAL LIGATION      FAMILY HISTORY: Family History  Problem Relation Age of Onset  . Heart attack Father   . Emphysema Father   . Aneurysm Mother        CEREBRAL  . Emphysema Mother   . Dementia Mother   . Diabetes Son   . Hypertension Son   . Hypertension Son   . Cancer Sister        Widely Metastatic  . Asthma Son        x2  . Breast cancer Paternal Aunt   . Rheum arthritis Maternal Aunt     SOCIAL HISTORY:  Social History   Social History  .  Marital status: Married    Spouse name: N/A  . Number of children: 2  . Years of education: N/A   Occupational History  . disabled    Social History Main Topics  . Smoking status: Current Every Day Smoker    Packs/day: 0.75    Years: 50.00    Types: Cigarettes    Start date: 09/13/1961  . Smokeless tobacco: Never Used     Comment: Decreased Down to 2/3 a Day if Thinking Abouit It  . Alcohol use No     Comment: none in 10 years  . Drug use: No  . Sexual activity: No   Other Topics Concern  . Not on file   Social History Narrative   She is from [facility]. She has always lived in [place]. She has traveled to [place], [place], [place], [place], & [place]. 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 Prior exposure to parakeets with last exposure remote in her current home. Previously had mold in her home that was in her bathroom & fixed. Prior exposure to hot tubs but none recently. Pt lives at home with [name], husband.  Has 2 childrean,  12th grade education.       PHYSICAL EXAM  GENERAL EXAM/CONSTITUTIONAL: Vitals:  Vitals:   08/22/16 1431  BP: (!) 146/93  Pulse: 84  Weight: 129 lb 8 oz (58.7 kg)  Height: 5\' 6"  (1.676 m)   Body mass index is 20.9 kg/m. No exam data present   IN WHEELCHAIR; CALM, RELAXED, INTERACTIVE  CARDIOVASCULAR:  Examination of carotid arteries is normal; no carotid bruits  IRREGULAR RATE AND RHYTHM   Examination of peripheral vascular system by observation and palpation is normal  EYES:  Ophthalmoscopic exam of optic discs and posterior segments is normal; no papilledema or hemorrhages  MUSCULOSKELETAL:  Gait, strength, tone, movements noted in Neurologic exam below  NEUROLOGIC: MENTAL STATUS:  MMSE - Mini Mental State Exam 08/22/2016 07/01/2016  Orientation to time 0 1  Orientation to Place 3 3  Registration 3 3  Attention/ Calculation 1 3  Recall 2 1  Language- name 2 objects 2 2  Language- repeat 1 1  Language- follow 3 step command 3 3  Language- read & follow direction 1 1  Write a sentence 1 1  Copy design 1 1  Total score 18 20    Awake and alert  DECR memory   DECR attention and concentration  DECR FLUENCY; comprehension intact, naming intact,   fund of knowledge appropriate  CRANIAL NERVE:   2nd - no papilledema on fundoscopic exam  2nd, 3rd, 4th, 6th - pupils equal and reactive to light, visual fields full to confrontation, extraocular muscles intact, no nystagmus  5th - facial sensation symmetric  7th - facial strength symmetric  8th - hearing intact  9th - palate elevates symmetrically, uvula midline  11th - shoulder shrug symmetric  12th - tongue protrusion midline  MOTOR:   BUE 4+  BLE 5  NO TREMOR  SENSORY:   normal and symmetric to light touch  COORDINATION:   finger-nose-finger, fine finger movements SLOW  REFLEXES:   deep tendon reflexes TRACE and symmetric  GAIT/STATION:   IN WHEEL CHAIR; ABLE TO STAND WITH  ASSISTANCE    DIAGNOSTIC DATA (LABS, IMAGING, TESTING) - I reviewed patient records, labs, notes, testing and imaging myself where available.  Lab Results  Component  Value Date   WBC 9.0 08/16/2016   HGB 12.4 08/16/2016   HCT 36.6 08/16/2016   MCV 86.3 08/16/2016   PLT 284 08/16/2016      Component Value Date/Time   NA 133 (L) 08/16/2016 0353   K 3.3 (L) 08/16/2016 0353   CL 101 08/16/2016 0353   CO2 22 08/16/2016 0353   GLUCOSE 105 (H) 08/16/2016 0353   BUN 13 08/16/2016 0353   CREATININE 1.01 (H) 08/16/2016 0353   CREATININE 0.87 12/17/2015 1304   CALCIUM 8.8 (L) 08/16/2016 0353   PROT 5.7 (L) 07/13/2016 0448   ALBUMIN 3.1 (L) 07/13/2016 0448   AST 19 07/13/2016 0448   ALT 21 07/13/2016 0448   ALKPHOS 53 07/13/2016 0448   BILITOT 0.6 07/13/2016 0448   GFRNONAA 54 (L) 08/16/2016 0353   GFRNONAA 65 04/28/2015 1525   GFRAA >60 08/16/2016 0353   GFRAA 75 04/28/2015 1525   Lab Results  Component Value Date   CHOL 195 04/28/2015   HDL 43 (L) 04/28/2015   LDLCALC 114 04/28/2015   TRIG 188 (H) 04/28/2015   CHOLHDL 4.5 04/28/2015   Lab Results  Component Value Date   HGBA1C 6.4 (H) 04/28/2015   Lab Results  Component Value Date   VITAMINB12 682 07/08/2016   Lab Results  Component Value Date   TSH 0.024 (L) 08/16/2016    06/02/16 CT head [I reviewed images myself and agree with interpretation. -VRP]  - Stable atrophy with mild periventricular small vessel disease. No intracranial mass, hemorrhage, or extra-axial fluid collection. No acute infarct. Areas of arterial vascular calcification noted. Mild mucosal thickening in several ethmoid air cells.  06/02/16 CT abd/pelvis - No acute findings in the abdomen or pelvis. - Aortoiliac atherosclerosis.  07/08/16 CT head  - Stable and negative noncontrast CT appearance of the brain.    ASSESSMENT AND PLAN  71 y.o. year old female here with memory loss and confusion since early March 2018, in setting of her  sister passing away in Feb 2018, cataract surgery in May 26, 2016, chronic pain meds, stopping pain meds (oxycodone and methadone in April 2018), with 1 month of intractable nausea/vomiting, dehydration. Then with hypotension, vomiting, malaise and confusion, AKI, hyponatremia, and multiple hosp admissions from April - May 2018.      Dx: delirium due to (depression, chronic pain, decreased PO intake, dehydration, hyponatremia, narcotic withdrawal)  1. Delirium   2. Chronic pain syndrome   3. Neck pain   4. Chronic pain of right knee   5. Depression, unspecified depression type      PLAN:  I spent 25 minutes of face to face time with patient. Greater than 50% of time was spent in counseling and coordination of care with patient. In summary we discussed: - treat underlying medical issues - treat underlying pain and depression issues - improve nutrition, physical activity, cognitive stimulating activities - hopefully symptoms should gradually improve over time  Return in about 4 months (around 12/22/2016).    Penni Bombard, MD 08/21/2631, 3:54 PM Certified in Neurology, Neurophysiology and Neuroimaging  [facility] [address] [place], [place] [address] [phone]

## 2016-08-23 ENCOUNTER — Ambulatory Visit: Payer: Medicare Other | Admitting: Cardiology

## 2016-08-24 DIAGNOSIS — E785 Hyperlipidemia, unspecified: Secondary | ICD-10-CM | POA: Diagnosis not present

## 2016-08-24 DIAGNOSIS — M542 Cervicalgia: Secondary | ICD-10-CM | POA: Diagnosis not present

## 2016-08-24 DIAGNOSIS — G894 Chronic pain syndrome: Secondary | ICD-10-CM | POA: Diagnosis not present

## 2016-08-24 DIAGNOSIS — I5030 Unspecified diastolic (congestive) heart failure: Secondary | ICD-10-CM | POA: Diagnosis not present

## 2016-08-24 DIAGNOSIS — J449 Chronic obstructive pulmonary disease, unspecified: Secondary | ICD-10-CM | POA: Diagnosis not present

## 2016-08-24 DIAGNOSIS — I110 Hypertensive heart disease with heart failure: Secondary | ICD-10-CM | POA: Diagnosis not present

## 2016-08-25 DIAGNOSIS — I110 Hypertensive heart disease with heart failure: Secondary | ICD-10-CM | POA: Diagnosis not present

## 2016-08-25 DIAGNOSIS — E785 Hyperlipidemia, unspecified: Secondary | ICD-10-CM | POA: Diagnosis not present

## 2016-08-25 DIAGNOSIS — I5030 Unspecified diastolic (congestive) heart failure: Secondary | ICD-10-CM | POA: Diagnosis not present

## 2016-08-25 DIAGNOSIS — M542 Cervicalgia: Secondary | ICD-10-CM | POA: Diagnosis not present

## 2016-08-25 DIAGNOSIS — J449 Chronic obstructive pulmonary disease, unspecified: Secondary | ICD-10-CM | POA: Diagnosis not present

## 2016-08-25 DIAGNOSIS — G894 Chronic pain syndrome: Secondary | ICD-10-CM | POA: Diagnosis not present

## 2016-08-26 DIAGNOSIS — M542 Cervicalgia: Secondary | ICD-10-CM | POA: Diagnosis not present

## 2016-08-26 DIAGNOSIS — E785 Hyperlipidemia, unspecified: Secondary | ICD-10-CM | POA: Diagnosis not present

## 2016-08-26 DIAGNOSIS — J449 Chronic obstructive pulmonary disease, unspecified: Secondary | ICD-10-CM | POA: Diagnosis not present

## 2016-08-26 DIAGNOSIS — I5030 Unspecified diastolic (congestive) heart failure: Secondary | ICD-10-CM | POA: Diagnosis not present

## 2016-08-26 DIAGNOSIS — G894 Chronic pain syndrome: Secondary | ICD-10-CM | POA: Diagnosis not present

## 2016-08-26 DIAGNOSIS — I110 Hypertensive heart disease with heart failure: Secondary | ICD-10-CM | POA: Diagnosis not present

## 2016-08-31 ENCOUNTER — Ambulatory Visit (INDEPENDENT_AMBULATORY_CARE_PROVIDER_SITE_OTHER): Payer: Medicare Other | Admitting: Pharmacist

## 2016-08-31 ENCOUNTER — Ambulatory Visit: Payer: Medicare Other | Admitting: Cardiovascular Disease

## 2016-08-31 VITALS — BP 148/86 | HR 95

## 2016-08-31 DIAGNOSIS — I6529 Occlusion and stenosis of unspecified carotid artery: Secondary | ICD-10-CM

## 2016-08-31 DIAGNOSIS — I10 Essential (primary) hypertension: Secondary | ICD-10-CM | POA: Diagnosis not present

## 2016-08-31 MED ORDER — LISINOPRIL 5 MG PO TABS: 5.0000 mg | ORAL_TABLET | Freq: Every day | ORAL | 11 refills | Status: DC

## 2016-08-31 NOTE — Patient Instructions (Signed)
It was great to meet you today!  Please start taking Lisinopril 5 mg daily. Continue to monitor BP twice a daily and record numbers with dates and times. Bring values to next appointment with Destiny Hughes on June 18th. Call the clinic with any dizziness that's worse than baseline, or if BP > 150/90 for multiple times in a row.

## 2016-08-31 NOTE — Progress Notes (Signed)
Patient ID: Destiny Hughes                 DOB: December 11, 1945                      MRN: [id number]     HPI: Destiny Hughes is a 71 y.o. female patient of Dr. Oval Linsey, referred on hospital discharge to HTN clinic. PMH is significant for Afib (s/p ablation 08/16/16, on Eliquis), HTN, HLD, hypothyroidism, and COPD. Patient was recently seen by Dr. Oval Linsey on 05/20/16 with BP controlled on controlled on lisinopril 10mg  daily, propranolol 80mg  bid, and furosemide 20mg  daily (for dilated IVC). Since then, patient has been admitted several times with multiple medication changes (see below).   Medication changes during recent admissions:  07/01/16 - furosemide dc'd d/t AKI, propranolol changed to prn palpitations, lisinopril & digoxin dc'd for soft BP 07/08/16 - addition of metoprolol tid for SVT 08/15/16 - metoprolol dc'd d/t pauses, ablation performed 08/16/16  Patient presents today with husband and son in a wheelchair for BP evaluation since medication changes. She is no longer on any BP medications. She denies palpitations, but reports dizziness and headaches (which family states have been on-going prior to medication changes). Husband measures BP at home, but cannot recall all of the numbers. Their BP monitor has green-orange-red zones for different BP values; he states that the patient has been in the green zone most, some orange, no red. Patient and family note that she doesn't drink water unless prompted (short-term memory deficits per family).   Current HTN meds:  None  Previously tried: lisinopril 10 mg, metoprolol, propranolol, atenolol  BP goal: < 140/90 mmHg  Family History: The patient's family history includes Aneurysm in her mother; Asthma in her son; Breast cancer in her paternal aunt; Cancer in her sister; Diabetes in her son; Emphysema in her father and mother; Heart attack in her father; Hypertension in her son and son; Rheum arthritis in her maternal aunt.   Social History: The  patient reports that she has been smoking Cigarettes.  She started smoking about 54 years ago. She has a 37.50 pack-year smoking history. She has never used smokeless tobacco. She reports that she does not drink alcohol or use drugs.   Diet: Chronic low sodium - not on diet restrictions.   Exercise: Can't walk long distances, wheelchair for travel. No exercise.    Home BP readings: 140s/80-90s, BP monitor has a red/orange/green - has been in green zone mostly, some orange, no red   Wt Readings from Last 3 Encounters:  08/22/16 129 lb 8 oz (58.7 kg)  08/17/16 131 lb 4.8 oz (59.6 kg)  08/09/16 130 lb 9.6 oz (59.2 kg)   BP Readings from Last 3 Encounters:  08/22/16 (!) 146/93  08/17/16 (!) 117/59  08/09/16 134/82   Pulse Readings from Last 3 Encounters:  08/22/16 84  08/17/16 83  08/09/16 65    Renal function: Estimated Creatinine Clearance: 46.7 mL/min (A) (by C-G formula based on SCr of 1.01 mg/dL (H)).  Past Medical History:  Diagnosis Date  . Arthritis 01-25-11   back and most joints  . Blood transfusion    after Thoracic surgery ?'04  . Chronic back pain   . Chronic neck pain   . Collapse of right lung   . COPD (chronic obstructive pulmonary disease) (HCC)    Hx. Bronchitis occ, 01-25-11 somes issue now taking  Z-pak-started 01-24-11/Scar tissue  present  from previous lung collapse  .  Depression   . Dyspnea   . Dysrhythmia    hx.PAT- tx. Inderal  . Emphysema of lung ([place])   . GERD (gastroesophageal reflux disease)    tx. TUMS  . Glaucoma   . Headache(784.0) 01-25-11   neck and nerve pain related.  . Hyperlipemia   . Hypothyroidism    tx. Levothyroxine  . Nephrolithiasis   . Neuromuscular disorder ([place]) 01-25-11   Pain/nerve stimulator implanted -2 yrs ago.  . Palpitation   . Tachycardia     Current Outpatient Prescriptions on File Prior to Visit  Medication Sig Dispense Refill  . acetaminophen (TYLENOL) 325 MG tablet Take 2 tablets (650 mg total) by mouth  every 6 (six) hours as needed for headache (pain).    Marland Kitchen apixaban (ELIQUIS) 5 MG TABS tablet Take 1 tablet (5 mg total) by mouth 2 (two) times daily. 60 tablet 6  . diclofenac sodium (VOLTAREN) 1 % GEL Apply 2 g topically 4 (four) times daily as needed. To right chest area for pain 100 g 0  . levothyroxine (SYNTHROID, LEVOTHROID) 150 MCG tablet Take 1 1/2 tablet (225 mcg) by mouth on Tuesday, Thursday before breakfast, take 1 tablet (150 mcg) before breakfast on Sunday, Monday, Wednesday, Friday, Saturday 135 tablet 1  . Multiple Vitamin (MULTIVITAMIN WITH MINERALS) TABS tablet Take 1 tablet by mouth daily. Women's Alive Gummies    . pantoprazole (PROTONIX) 40 MG tablet Take 1 tablet (40 mg total) by mouth 2 (two) times daily. 60 tablet 0  . promethazine (PHENERGAN) 25 MG tablet Take 25 mg by mouth every 6 (six) hours as needed for nausea or vomiting.    . sertraline (ZOLOFT) 100 MG tablet Take 100 mg by mouth at bedtime.    . [DISCONTINUED] ferrous sulfate 325 (65 FE) MG tablet Take 1 tablet (325 mg total) by mouth 3 (three) times daily after meals.    . [DISCONTINUED] rivaroxaban (XARELTO) 10 MG TABS tablet Take 1 tablet (10 mg total) by mouth daily. 12 tablet    No current facility-administered medications on file prior to visit.     Allergies  Allergen Reactions  . Albuterol Other (See Comments)    Had difficulty breathing after a nebulizer treatment  . Penicillins Anaphylaxis and Swelling    Has patient had a PCN reaction causing immediate rash, facial/tongue/throat swelling, SOB or lightheadedness with hypotension: Yes Has patient had a PCN reaction causing severe rash involving mucus membranes or skin necrosis: Rash Has patient had a PCN reaction that required hospitalization: No Has patient had a PCN reaction occurring within the last 10 years: No If all of the above answers are "NO", then may proceed with Cephalosporin use.  [name] [Kdc:Albuterol] Shortness Of Breath  . Sulfa  Antibiotics Swelling    Throat swells, but no shortness of breath noted  . Zanaflex [Tizanidine] Other (See Comments)    Possible confusion (??)  . Codeine Nausea And Vomiting  . Ecotrin [Aspirin] Nausea And Vomiting     Assessment/Plan:  1. Hypertension - BP above goal of < 140/90 mmHg since discontinuation of all BP medications. Will restart lisinopril at lower dose of 5mg  daily. Will enter Bmet recheck with next appointment on June 18th. Encouraged family to check BP at home twice daily and record values.   Belia Heman, PharmD PGY1 Resident 08/31/2016 12:30 PM  Patient seen with: Tana Coast, PharmD [place] [address] [place], [place] [address] Phone: [phone]; Fax: [phone]

## 2016-09-01 ENCOUNTER — Other Ambulatory Visit: Payer: Self-pay

## 2016-09-01 DIAGNOSIS — M542 Cervicalgia: Secondary | ICD-10-CM | POA: Diagnosis not present

## 2016-09-01 DIAGNOSIS — G894 Chronic pain syndrome: Secondary | ICD-10-CM | POA: Diagnosis not present

## 2016-09-01 DIAGNOSIS — J449 Chronic obstructive pulmonary disease, unspecified: Secondary | ICD-10-CM | POA: Diagnosis not present

## 2016-09-01 DIAGNOSIS — E785 Hyperlipidemia, unspecified: Secondary | ICD-10-CM | POA: Diagnosis not present

## 2016-09-01 DIAGNOSIS — I5030 Unspecified diastolic (congestive) heart failure: Secondary | ICD-10-CM | POA: Diagnosis not present

## 2016-09-01 DIAGNOSIS — I110 Hypertensive heart disease with heart failure: Secondary | ICD-10-CM | POA: Diagnosis not present

## 2016-09-01 NOTE — Patient Outreach (Signed)
    Telephone contact with patient's husband who advised this RNCM he and his wife would be available for home visit next week.

## 2016-09-05 ENCOUNTER — Encounter: Payer: Self-pay | Admitting: Cardiology

## 2016-09-05 ENCOUNTER — Ambulatory Visit (INDEPENDENT_AMBULATORY_CARE_PROVIDER_SITE_OTHER): Payer: Medicare Other | Admitting: Cardiology

## 2016-09-05 ENCOUNTER — Other Ambulatory Visit: Payer: Medicare Other

## 2016-09-05 VITALS — BP 120/68 | HR 64 | Ht 66.0 in | Wt 132.0 lb

## 2016-09-05 DIAGNOSIS — E039 Hypothyroidism, unspecified: Secondary | ICD-10-CM | POA: Diagnosis not present

## 2016-09-05 DIAGNOSIS — J438 Other emphysema: Secondary | ICD-10-CM | POA: Diagnosis not present

## 2016-09-05 DIAGNOSIS — I4891 Unspecified atrial fibrillation: Secondary | ICD-10-CM

## 2016-09-05 DIAGNOSIS — I10 Essential (primary) hypertension: Secondary | ICD-10-CM

## 2016-09-05 DIAGNOSIS — Z79899 Other long term (current) drug therapy: Secondary | ICD-10-CM | POA: Diagnosis not present

## 2016-09-05 DIAGNOSIS — G894 Chronic pain syndrome: Secondary | ICD-10-CM | POA: Diagnosis not present

## 2016-09-05 DIAGNOSIS — I483 Typical atrial flutter: Secondary | ICD-10-CM | POA: Diagnosis not present

## 2016-09-05 DIAGNOSIS — I6529 Occlusion and stenosis of unspecified carotid artery: Secondary | ICD-10-CM

## 2016-09-05 MED ORDER — LEVOTHYROXINE SODIUM 150 MCG PO TABS: 150.0000 ug | ORAL_TABLET | Freq: Every day | ORAL | 1 refills | Status: DC

## 2016-09-05 NOTE — Progress Notes (Signed)
09/05/2016 Destiny Hughes   1945-05-27  [id number]  Primary Physician Minette Brine Primary Cardiologist: Dr [place]/ Dr Lovena Le  HPI:  71 y/o female with COPD, chronic pain syndrome, DJD, HTN, and recently PAF (flutter). She was admitted to the hospital 08/16/16 with tachycardia. She had just been hospitalized in April and seen in the AF clinic 08/09/16 with 2:1 atrial flutter. Eliquis was added 08/09/16 (CHADs VASc 2-3) and her Toprol was increased to 50 mg BID. Her HR remained elevated and she was brought to the ED 08/15/16. IV Diltiazem was added and the plan was for TEE/DCCV. She was seen in consult by Dr Lovena Le and the plan was changed to TEE/RFA. She did have one 7 second pause and Dr Lovena Le suggested no AV nodal blocking agents. She had her procedure 08/16/16 and tolerated this well. She was transiently hypotensive post op. She was discharged and seen as an OP 08/31/16 by Ms Ouida Sills [facility] and started on Lisinopril 5 mg. I'm seeing her now for follow up. In reviewing her records I noted the pt had a TSH of 0.024 on 08/16/16. I don't think was noted during her admission. Her B/P is acceptable. Her HR is high-98 sinus tach.    Current Outpatient Prescriptions  Medication Sig Dispense Refill  . acetaminophen (TYLENOL) 325 MG tablet Take 2 tablets (650 mg total) by mouth every 6 (six) hours as needed for headache (pain).    Marland Kitchen apixaban (ELIQUIS) 5 MG TABS tablet Take 1 tablet (5 mg total) by mouth 2 (two) times daily. 60 tablet 6  . diclofenac sodium (VOLTAREN) 1 % GEL Apply 2 g topically 4 (four) times daily as needed. To right chest area for pain 100 g 0  . levothyroxine (SYNTHROID, LEVOTHROID) 150 MCG tablet Take 1 tablet (150 mcg total) by mouth daily before breakfast. 90 tablet 1  . lisinopril (PRINIVIL,ZESTRIL) 5 MG tablet Take 1 tablet (5 mg total) by mouth daily. 30 tablet 11  . Multiple Vitamin (MULTIVITAMIN WITH MINERALS) TABS tablet Take 1 tablet by mouth daily. Women's Alive Gummies     . pantoprazole (PROTONIX) 40 MG tablet Take 1 tablet (40 mg total) by mouth 2 (two) times daily. 60 tablet 0  . promethazine (PHENERGAN) 25 MG tablet Take 25 mg by mouth every 6 (six) hours as needed for nausea or vomiting.    . sertraline (ZOLOFT) 100 MG tablet Take 100 mg by mouth at bedtime.     No current facility-administered medications for this visit.     Allergies  Allergen Reactions  . Albuterol Other (See Comments)    Had difficulty breathing after a nebulizer treatment  . Penicillins Anaphylaxis and Swelling    Has patient had a PCN reaction causing immediate rash, facial/tongue/throat swelling, SOB or lightheadedness with hypotension: Yes Has patient had a PCN reaction causing severe rash involving mucus membranes or skin necrosis: Rash Has patient had a PCN reaction that required hospitalization: No Has patient had a PCN reaction occurring within the last 10 years: No If all of the above answers are "NO", then may proceed with Cephalosporin use.  [name] [Kdc:Albuterol] Shortness Of Breath  . Sulfa Antibiotics Swelling    Throat swells, but no shortness of breath noted  . Zanaflex [Tizanidine] Other (See Comments)    Possible confusion (??)  . Codeine Nausea And Vomiting  . Ecotrin [Aspirin] Nausea And Vomiting    Past Medical History:  Diagnosis Date  . Arthritis 01-25-11   back and most joints  .  Blood transfusion    after Thoracic surgery ?'04  . Chronic back pain   . Chronic neck pain   . Collapse of right lung   . COPD (chronic obstructive pulmonary disease) (HCC)    Hx. Bronchitis occ, 01-25-11 somes issue now taking  Z-pak-started 01-24-11/Scar tissue  present  from previous lung collapse  . Depression   . Dyspnea   . Dysrhythmia    hx.PAT- tx. Inderal  . Emphysema of lung ([place])   . GERD (gastroesophageal reflux disease)    tx. TUMS  . Glaucoma   . Headache(784.0) 01-25-11   neck and nerve pain related.  . Hyperlipemia   . Hypothyroidism    tx.  Levothyroxine  . Nephrolithiasis   . Neuromuscular disorder ([place]) 01-25-11   Pain/nerve stimulator implanted -2 yrs ago.  . Palpitation   . Tachycardia     Social History   Social History  . Marital status: Married    Spouse name: N/A  . Number of children: 2  . Years of education: N/A   Occupational History  . disabled    Social History Main Topics  . Smoking status: Current Every Day Smoker    Packs/day: 0.75    Years: 50.00    Types: Cigarettes    Start date: 09/13/1961  . Smokeless tobacco: Never Used     Comment: Decreased Down to 2/3 a Day if Thinking Abouit It  . Alcohol use No     Comment: none in 10 years  . Drug use: No  . Sexual activity: No   Other Topics Concern  . Not on file   Social History Narrative   She is from [facility]. She has always lived in [place]. She has traveled to [place], [place], [place], [place], & [place]. 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 Prior exposure to parakeets with last exposure remote in her current home. Previously had mold in her home that was in her bathroom & fixed. Prior exposure to hot tubs but none recently. Pt lives at home with [name], husband.  Has 2 childrean, 12th grade education.       Family History  Problem Relation Age of Onset  . Heart attack Father   . Emphysema Father   . Aneurysm Mother        CEREBRAL  . Emphysema Mother   . Dementia Mother   . Diabetes Son   . Hypertension Son   . Hypertension Son   . Cancer Sister        Widely Metastatic  . Asthma Son        x2  . Breast cancer Paternal Aunt   . Rheum arthritis Maternal Aunt      Review of Systems: General: negative for chills, fever, night sweats or weight changes.  Cardiovascular: negative for chest pain, dyspnea on exertion, edema, orthopnea, palpitations, paroxysmal nocturnal dyspnea or shortness of breath Dermatological: negative for  rash Respiratory: negative for cough or wheezing Urologic: negative for hematuria Abdominal: negative for nausea, vomiting, diarrhea, bright red blood per rectum, melena, or hematemesis Neurologic: negative for visual changes, syncope, or dizziness All other systems reviewed and are otherwise negative except as noted above.    Blood pressure 120/68, pulse 64, height 5\' 6"  (1.676 m), weight 132 lb (59.9 kg).  General appearance: alert, cooperative and no distress, chronically ill appearing Lungs: clear to auscultation bilaterally Heart:  regular rate and rhythm Extremities: extremities normal, atraumatic, no cyanosis or edema Skin: Skin color, texture, turgor normal. No rashes or lesions Neurologic: Grossly normal  EKG Sinus tachycardia with PACs  ASSESSMENT AND PLAN:   Atrial flutter (HCC) S/P TEE followed by RFA 08/16/16  Essential hypertension B/P running slightly high but pt did have some orthostatic B/P's in the hospital (associated with pain medication)  Chronic pain disorder Chronic neck and back pain  Emphysema lung ([place]) CXR 08/15/16- hyperinflation  Hypothyroidism TSH on 08/16/16 was 0.024- will reduce Synthroid and have pt f/u with PCP ASAP   PLAN  I cut her Synthroid back to 150 mcg daily and asked her to f/u with her PCP about this ASAP. She see's Dr Lovena Le July 9th. I ordered a BMP and Mg++ today (both low in the hospital). I'll ask Dr Lovena Le about starting low dose beta blocker with her iatrogenic hyperthyroidism.   Destiny Ransom PA-C 09/05/2016 3:26 PM

## 2016-09-05 NOTE — Assessment & Plan Note (Signed)
B/P running slightly high but pt did have some orthostatic B/P's in the hospital (associated with pain medication)

## 2016-09-05 NOTE — Assessment & Plan Note (Signed)
CXR 08/15/16- hyperinflation

## 2016-09-05 NOTE — Assessment & Plan Note (Signed)
Chronic neck and back pain

## 2016-09-05 NOTE — Assessment & Plan Note (Signed)
TSH on 08/16/16 was 0.024- will reduce Synthroid and have pt f/u with PCP ASAP

## 2016-09-05 NOTE — Patient Instructions (Signed)
Medication Instructions:   HOLD your synthroid x1 day THEN take 1 tab daily. Follow up with your primary care physician about your TSH.  Labwork:   BMET and Magnesium today (non-fasting labs)  Testing/Procedures:  none   Follow-Up:  As scheduled with Dr. Lovena Le   If you need a refill on your cardiac medications before your next appointment, please call your pharmacy.

## 2016-09-05 NOTE — Assessment & Plan Note (Signed)
S/P TEE followed by RFA 08/16/16

## 2016-09-06 ENCOUNTER — Other Ambulatory Visit: Payer: Self-pay

## 2016-09-06 LAB — BASIC METABOLIC PANEL
BUN/Creatinine Ratio: 20 (ref 12–28)
BUN: 13 mg/dL (ref 8–27)
CO2: 24 mmol/L (ref 20–29)
Calcium: 9.5 mg/dL (ref 8.7–10.3)
Chloride: 98 mmol/L (ref 96–106)
Creatinine, Ser: 0.66 mg/dL (ref 0.57–1.00)
GFR calc Af Amer: 102 mL/min/{1.73_m2} (ref >59)
GFR calc non Af Amer: 89 mL/min/{1.73_m2} (ref >59)
Glucose: 165 mg/dL — ABNORMAL HIGH (ref 65–99)
Potassium: 4.5 mmol/L (ref 3.5–5.2)
Sodium: 139 mmol/L (ref 134–144)

## 2016-09-06 LAB — MAGNESIUM: Magnesium: 1.7 mg/dL (ref 1.6–2.3)

## 2016-09-06 NOTE — Patient Outreach (Signed)
[facility]) Care Management  09/06/2016  [place] Stell Muellner June 30, 1945 [id number]   This RNCM arrived at patient's home for scheduled home visit. RNCM was met at door by patient's husband who advises he forgot about today's home visit. Patient husband stated patient was still in the bed. Patient's husband continued by saying he was on his way to a doctor's appointment for himself. This RNCM advised husband this visit could be rescheduled, further advised that she would be calling later month to reschedule.  Plan: Telephone contact with patient to reschedule today's home visit.

## 2016-09-07 ENCOUNTER — Telehealth: Payer: Self-pay | Admitting: *Deleted

## 2016-09-07 MED ORDER — MAGNESIUM OXIDE 400 MG PO TABS: 400.0000 mg | ORAL_TABLET | Freq: Every day | ORAL | 3 refills | Status: DC

## 2016-09-07 NOTE — Telephone Encounter (Signed)
-----   Message from Destiny Hughes, [place] sent at 09/06/2016 12:44 PM EDT ----- This pt should be on Mag Ox 400 mg daily  LUKE KILROY PA-C 09/06/2016 12:44 PM

## 2016-09-07 NOTE — Telephone Encounter (Signed)
Results and recommendations discussed with patient husband (DPR), who verbalized understanding and thanks. Rx sent to pharmacy.

## 2016-09-08 ENCOUNTER — Encounter: Payer: Self-pay | Admitting: Internal Medicine

## 2016-09-13 ENCOUNTER — Other Ambulatory Visit: Payer: Self-pay

## 2016-09-13 NOTE — Patient Outreach (Signed)
      This RNCM received a telephone call from patient who requested a change of home visit time on June 29. This RNCM and patient agreed on time and date of home visit.

## 2016-09-16 ENCOUNTER — Other Ambulatory Visit: Payer: Self-pay

## 2016-09-16 ENCOUNTER — Ambulatory Visit: Payer: Medicare Other

## 2016-09-16 NOTE — Patient Outreach (Signed)
[facility]) Care Management   09/16/2016  Timesha Cervantez [place] 16-Mar-1946 [id number]  Narissa Beaufort Earhart is an 71 y.o. female.   Subjective:  I am having a lot of hot flashes.  Objective:   ROS  Well groomed woman sitting in recliner   Physical Exam  ROS  Encounter Medications:   Outpatient Encounter Prescriptions as of 09/16/2016  Medication Sig Note  . acetaminophen (TYLENOL) 325 MG tablet Take 2 tablets (650 mg total) by mouth every 6 (six) hours as needed for headache (pain).   Marland Kitchen apixaban (ELIQUIS) 5 MG TABS tablet Take 1 tablet (5 mg total) by mouth 2 (two) times daily.   . diclofenac sodium (VOLTAREN) 1 % GEL Apply 2 g topically 4 (four) times daily as needed. To right chest area for pain 08/18/2016: Has not needed recently   . levothyroxine (SYNTHROID, LEVOTHROID) 150 MCG tablet Take 1 tablet (150 mcg total) by mouth daily before breakfast.   . lisinopril (PRINIVIL,ZESTRIL) 5 MG tablet Take 1 tablet (5 mg total) by mouth daily.   . magnesium oxide (MAG-OX) 400 MG tablet Take 1 tablet (400 mg total) by mouth daily.   . Multiple Vitamin (MULTIVITAMIN WITH MINERALS) TABS tablet Take 1 tablet by mouth daily. Women's Alive Gummies   . pantoprazole (PROTONIX) 40 MG tablet Take 1 tablet (40 mg total) by mouth 2 (two) times daily.   . promethazine (PHENERGAN) 25 MG tablet Take 25 mg by mouth every 6 (six) hours as needed for nausea or vomiting.   . sertraline (ZOLOFT) 100 MG tablet Take 100 mg by mouth at bedtime.    No facility-administered encounter medications on file as of 09/16/2016.     Functional Status:   In your present state of health, do you have any difficulty performing the following activities: 08/15/2016 08/15/2016  Hearing? - N  Vision? - N  Difficulty concentrating or making decisions? - Y  Walking or climbing stairs? - Y  Dressing or bathing? - N  Doing errands, shopping? Y -  Conservation officer, nature and eating ? - -  Using the Toilet? - -  In the  past six months, have you accidently leaked urine? - -  Do you have problems with loss of bowel control? - -  Managing your Medications? - -  Managing your Finances? - -  Housekeeping or managing your Housekeeping? - -  Some recent data might be hidden    Fall/Depression Screening:    Fall Risk  07/26/2016 07/15/2016 04/28/2015  Falls in the past year? Yes - No  Number falls in past yr: 2 or more - -  Injury with Fall? Yes - -  Risk Factor Category  High Fall Risk - -  Risk for fall due to : History of fall(s);Impaired balance/gait;Impaired mobility;Impaired vision Impaired balance/gait;Impaired mobility;Impaired vision;Medication side effect -  Follow up Falls prevention discussed - -   PHQ 2/9 Scores 07/26/2016 07/15/2016 04/28/2015 10/03/2014 05/21/2014  PHQ - 2 Score 0 0 0 2 0  PHQ- 9 Score - - - 6 -   [facility] CM Care Plan Problem One     Most Recent Value  Care Plan Problem One  Patient recently discharged from the hospital  Role Documenting the Problem One  Care Management [place] for Problem One  Active  THN Long Term Goal   Patient will have no acute care admissions in the next 31 days.  [facility]N Long Term Goal Start Date  07/15/16  [facility] Long Term  Goal Met Date  08/14/16  Interventions for Problem One Long Term Goal  telephone contact with patient's husband who advises patient continues to have difficult days but is having some good days.  Husband also reports he is exhausted from providing care.    THN CM Short Term Goal #1   In the next 28 days, patient will meet with the [facility]N RNCM  for assessment of community to assess her need for community resources  THN CM Short Term Goal #1 Start Date  07/15/16  [facility] CM Short Term Goal #1 Met Date  09/16/16  Interventions for Short Term Goal #1  6/29 home visit to assess need for community resource referral    THN CM Care Plan Problem Two     Most Recent Value  Care Plan Problem Two  patient and caregiver will meet with [facility] pharmacist for  medicaiton education in the next 28 days   Role Documenting the Problem Two  Care Management Coordinator    THN CM Care Plan Problem Three     Most Recent Value  Care Plan Problem Three  Risk of hospital readmission related to recent hospitalization May 28-30, 2018 for A-flutter with RVR  Role Documenting the Problem Three  Care Management Coordinator  Care Plan for Problem Three  Active  THN Long Term Goal   Patient will not experience hospital readmission within the next 31 days, as evidenced by patient/ caregiver reporting and review of EMR during [facility] RN CM outreach  [facility]N Long Term Goal Start Date  08/18/16  [facility] Long Term Goal Met Date  09/16/16  Interventions for Problem Three Long Term Goal  6/29 patient has had no acute care admission in the last 31 days     THN CM Care Plan Problem One     Most Recent Value  Care Plan Problem One  Patient recently discharged from the hospital  Role Documenting the Problem One  Care Management Coordinator  Care Plan for Problem One  Active  THN Long Term Goal   Patient will have no acute care admissions in the next 31 days.  THN Long Term Goal Start Date  07/15/16  [facility]N Long Term Goal Met Date  08/14/16  Interventions for Problem One Long Term Goal  telephone contact with patient's husband who advises patient continues to have difficult days but is having some good days.  Husband also reports he is exhausted from providing care.    THN CM Short Term Goal #1   In the next 28 days, patient will meet with the [facility] RNCM  for assessment of community to assess her need for community resources  THN CM Short Term Goal #1 Start Date  07/15/16  [facility] CM Short Term Goal #1 Met Date  09/16/16  Interventions for Short Term Goal #1  6/29 home visit to assess need for community resource referral    THN CM Care Plan Problem Two     Most Recent Value  Care Plan Problem Two  patient and caregiver will meet with [facility] pharmacist for medicaiton education in the next 28 days    Role Documenting the Problem Two  Care Management Coordinator    THN CM Care Plan Problem Three     Most Recent Value  Care Plan Problem Three  Risk of hospital readmission related to recent hospitalization May 28-30, 2018 for A-flutter with RVR  Role Documenting the Problem Three  Care Management Coordinator  Care Plan for Problem Three  Active  THN   Long Term Goal   Patient will not experience hospital readmission within the next 31 days, as evidenced by patient/ caregiver reporting and review of EMR during [facility] RN CM outreach  [facility] Long Term Goal Start Date  08/18/16  [facility] Long Term Goal Met Date  09/16/16  Interventions for Problem Three Long Term Goal  6/29 patient has had no acute care admission in the last 31 days     Assessment:   Patient states she has been having hot flashes for the last 3 weeks. Patient's husband is currently having difficulty walking, has increased pain, per his report, unable to assist patient with ambulating into her doctor appointments. Patient and her husband requested assistance in securing a wheelchair. This RNCM made telephone call to Glade Lloyd, NP to request order and order be faxed to [facility] [phone]. This RNCM made call to [place] to advise them of the incoming order for the wheelchair. This RNCM was advised someone would have to come pick up the chair, wheelchair delivers are not made._0  Plan:  Home visit in the next 21 days to assess need for additional community care coordination needs.

## 2016-09-19 ENCOUNTER — Other Ambulatory Visit: Payer: Self-pay

## 2016-09-19 NOTE — Patient Outreach (Signed)
     This RNCM receive telephone call from patients husband who advises this RNCM of the following: *over the weekend during patient's blood pressure check, patient blood pressure has run 120-160/90-100, *patient continues to have sweating spells at intervals, *Patient reports having palpitations this morning,   This RNCM made telephone contact with primary care provider to report and to request a same day appointment, However, provider had no appointments until Thursday.  This RNCM advised patient and husband to call 911 if she has further palpitations or go to [facility].  This RNCM was advised by patient's husband patient has an appointment with her cardiologist on Monday, July 9th.

## 2016-09-22 ENCOUNTER — Emergency Department (HOSPITAL_COMMUNITY)
Admission: EM | Admit: 2016-09-22 | Discharge: 2016-09-22 | Disposition: A | Discharge status: Home / Self Care | Payer: Medicare Other | Attending: Emergency Medicine | Admitting: Emergency Medicine

## 2016-09-22 ENCOUNTER — Emergency Department (HOSPITAL_COMMUNITY): Payer: Medicare Other

## 2016-09-22 ENCOUNTER — Encounter (HOSPITAL_COMMUNITY): Payer: Self-pay | Admitting: Emergency Medicine

## 2016-09-22 DIAGNOSIS — J449 Chronic obstructive pulmonary disease, unspecified: Secondary | ICD-10-CM | POA: Diagnosis not present

## 2016-09-22 DIAGNOSIS — R071 Chest pain on breathing: Secondary | ICD-10-CM

## 2016-09-22 DIAGNOSIS — E039 Hypothyroidism, unspecified: Secondary | ICD-10-CM | POA: Insufficient documentation

## 2016-09-22 DIAGNOSIS — I110 Hypertensive heart disease with heart failure: Secondary | ICD-10-CM | POA: Diagnosis not present

## 2016-09-22 DIAGNOSIS — Z7901 Long term (current) use of anticoagulants: Secondary | ICD-10-CM | POA: Diagnosis not present

## 2016-09-22 DIAGNOSIS — Z79899 Other long term (current) drug therapy: Secondary | ICD-10-CM | POA: Diagnosis not present

## 2016-09-22 DIAGNOSIS — F17210 Nicotine dependence, cigarettes, uncomplicated: Secondary | ICD-10-CM | POA: Insufficient documentation

## 2016-09-22 DIAGNOSIS — Z96651 Presence of right artificial knee joint: Secondary | ICD-10-CM | POA: Insufficient documentation

## 2016-09-22 DIAGNOSIS — Z9049 Acquired absence of other specified parts of digestive tract: Secondary | ICD-10-CM | POA: Insufficient documentation

## 2016-09-22 DIAGNOSIS — I5030 Unspecified diastolic (congestive) heart failure: Secondary | ICD-10-CM | POA: Insufficient documentation

## 2016-09-22 DIAGNOSIS — R002 Palpitations: Secondary | ICD-10-CM

## 2016-09-22 DIAGNOSIS — R079 Chest pain, unspecified: Secondary | ICD-10-CM | POA: Diagnosis not present

## 2016-09-22 LAB — BASIC METABOLIC PANEL
Anion gap: 9 (ref 5–15)
BUN: 10 mg/dL (ref 6–20)
CHLORIDE: 89 mmol/L — AB (ref 101–111)
CO2: 25 mmol/L (ref 22–32)
CREATININE: 0.81 mg/dL (ref 0.44–1.00)
Calcium: 9.7 mg/dL (ref 8.9–10.3)
GFR calc non Af Amer: >60 mL/min (ref >60)
Glucose, Bld: 150 mg/dL — ABNORMAL HIGH (ref 65–99)
POTASSIUM: 4.5 mmol/L (ref 3.5–5.1)
SODIUM: 123 mmol/L — AB (ref 135–145)

## 2016-09-22 LAB — T4, FREE: Free T4: 1.59 ng/dL — ABNORMAL HIGH (ref 0.61–1.12)

## 2016-09-22 LAB — I-STAT TROPONIN, ED: Troponin i, poc: 0.05 ng/mL (ref 0.00–0.08)

## 2016-09-22 LAB — TSH: TSH: 0.095 u[IU]/mL — AB (ref 0.350–4.500)

## 2016-09-22 LAB — CBC
HEMATOCRIT: 41.8 % (ref 36.0–46.0)
Hemoglobin: 14 g/dL (ref 12.0–15.0)
MCH: 29.9 pg (ref 26.0–34.0)
MCHC: 33.5 g/dL (ref 30.0–36.0)
MCV: 89.1 fL (ref 78.0–100.0)
Platelets: 445 10*3/uL — ABNORMAL HIGH (ref 150–400)
RBC: 4.69 MIL/uL (ref 3.87–5.11)
RDW: 14.4 % (ref 11.5–15.5)
WBC: 8.5 10*3/uL (ref 4.0–10.5)

## 2016-09-22 LAB — D-DIMER, QUANTITATIVE: D-Dimer, Quant: 0.36 ug/mL-FEU (ref 0.00–0.50)

## 2016-09-22 LAB — MAGNESIUM: MAGNESIUM: 1.9 mg/dL (ref 1.7–2.4)

## 2016-09-22 MED ORDER — LEVALBUTEROL TARTRATE 45 MCG/ACT IN AERO: 2.0000 | INHALATION_SPRAY | RESPIRATORY_TRACT | 12 refills | Status: DC | PRN

## 2016-09-22 MED ORDER — SODIUM CHLORIDE 0.9 % IV BOLUS (SEPSIS)
500.0000 mL | Freq: Once | INTRAVENOUS | Status: AC
  Administered 2016-09-22: 500 mL via INTRAVENOUS

## 2016-09-22 MED ORDER — NITROGLYCERIN 0.4 MG SL SUBL
0.4000 mg | SUBLINGUAL_TABLET | SUBLINGUAL | Status: DC | PRN
  Administered 2016-09-22 (×2): 0.4 mg via SUBLINGUAL
  Filled 2016-09-22: qty 1

## 2016-09-22 MED ORDER — ACETAMINOPHEN 325 MG PO TABS
650.0000 mg | ORAL_TABLET | Freq: Once | ORAL | Status: AC
  Administered 2016-09-22: 650 mg via ORAL
  Filled 2016-09-22: qty 2

## 2016-09-22 NOTE — Consult Note (Signed)
Cardiology Consult Note:   Patient ID: Destiny Hughes; [id number]; [birth year]   Admission date: 09/22/2016  Primary Care Provider: Minette Brine Primary Cardiologist: Dr. Oval Linsey Primary Electrophysiologist:  Dr. Lovena Le  Chief Complaint:  CP/palpitations  Patient Profile:   Destiny Hughes is a 71 y.o. female with a history of COPD, MVP, chronic pain syndrome, tobacco abuse, hypothyroidism, DJD, HTN, and PAFib/flutter s/p RFA 07/2016 who presented to [facility] ED today with chest pain and palpitations.   History of Present Illness:   Destiny Hughes has a history of chronic palpitations that were treated with propranolol and digoxin. She had a stress test in 08/2015 that was negative for ischemia. She had an echo and myoview in 08/2015 by Dr. Oval Linsey showing normal LVEF and no ischemia, normal LA size and mild RAE.  She was admitted in 4/13-4/15/18 for AMS and found to have AKI and bradycardia. Digoxin was discontinued and propranolol was changed to PRN.  She was readmitted 4/20-4/26/18 for fall, weakness, AMS. Found to have hyponatremia and acute renal failure. She had runs of SVT and cardiology consulted and started a BB. She was discharged with an outpatient monitor. This showed atrial fibrillation and flutter with rapid rates 190-200bpm.   She was seen in the afib clinic and started on Eliquis.  She was readmitted 5/28-5/30/18 for persistent tachy-palpitations and increasing fatigue, noted to be in rapid AFlutter. Initially treated with Diltiazem gtt that resulted in pauses, her diltiazem and toprol held, she c/w rapid AFlutter and EP was asked to evaluate. She underwent TEE without thrombus followed by EPS/ablation procedure on 08/16/16 by Dr. Lovena Le. She was discharged off AV nodal blocking agents.   She was seen in the office on 09/05/16 by Loma Boston PA-C for follow up and was doing well. TSH was noted to be suppressed so synthroid was decreased.   She was in her usual state of  health until this AM while laying bed when she had sudden onset of chest pain and palpitations. Her family members in the room report this as she doesn't remember this and has a history of short term memory loss since cataract surgery earlier this year. She currently has chest pain that is rated as a 4/10. It is described as a dull and aching pain that radiates to her left neck. She has associated SOB and diaphoresis. However, she has chronic SOB and hot flashes. Chest pain not necessarily related to exertion but the most exertion she gets is walking to the bathroom. No LE edema, orthopnea or PND. No dizziness or syncope. Still smoking.    Past Medical History:  Diagnosis Date  . Chronic back pain   . Collapse of right lung   . COPD (chronic obstructive pulmonary disease) (HCC)    Hx. Bronchitis occ, 01-25-11 somes issue now taking  Z-pak-started 01-24-11/Scar tissue  present  from previous lung collapse  . Depression   . GERD (gastroesophageal reflux disease)    tx. TUMS  . Glaucoma   . Hyperlipemia   . Hypothyroidism    tx. Levothyroxine  . Nephrolithiasis   . Neuromuscular disorder ([place]) 01-25-11   Pain/nerve stimulator implanted -2 yrs ago.    Past Surgical History:  Procedure Laterality Date  . A-FLUTTER ABLATION N/A 08/16/2016   Procedure: A-Flutter Ablation;  Surgeon: Evans Lance, MD;  Location: [facility];  Service: Cardiovascular;  Laterality: N/A;  . APPENDECTOMY  01-25-11  . CARPAL TUNNEL RELEASE  01-25-11   Bil.  . CERVICAL SPINE  SURGERY  01-25-11   2005-multiple levels  . CHOLECYSTECTOMY  01-25-11   '07  . COLONOSCOPY WITH PROPOFOL N/A 07/23/2012   Procedure: COLONOSCOPY WITH PROPOFOL;  Surgeon: Garlan Fair, MD;  Location: [facility];  Service: Endoscopy;  Laterality: N/A;  . EYE SURGERY    . JOINT REPLACEMENT  01-25-11   6'03 LTHA-hemi  . KNEE ARTHROPLASTY  01-25-11   '04-right, revised x2  . SPINAL CORD STIMULATOR IMPLANT  2009 APPROX   DR. ELSNER  .  THORACIC SPINE SURGERY  01-25-11   6'02- then nerve stimulator implanted after  . TOTAL KNEE REVISION  02/01/2011   Procedure: TOTAL KNEE REVISION;  Surgeon: Mauri Pole;  Location: [facility];  Service: Orthopedics;  Laterality: Right;  . TUBAL LIGATION       Medications Prior to Admission: Prior to Admission medications   Medication Sig Start Date End Date Taking? Authorizing Provider  acetaminophen (TYLENOL) 325 MG tablet Take 2 tablets (650 mg total) by mouth every 6 (six) hours as needed for headache (pain). 07/14/16  Yes Ghimire, Henreitta Leber, MD  apixaban (ELIQUIS) 5 MG TABS tablet Take 1 tablet (5 mg total) by mouth 2 (two) times daily. 08/09/16  Yes Sherran Needs, NP  diclofenac sodium (VOLTAREN) 1 % GEL Apply 2 g topically 4 (four) times daily as needed. To right chest area for pain Patient taking differently: Apply 2 g topically 4 (four) times daily as needed. Only uses as needed for pain 07/14/16  Yes Ghimire, Henreitta Leber, MD  levothyroxine (SYNTHROID, LEVOTHROID) 150 MCG tablet Take 1 tablet (150 mcg total) by mouth daily before breakfast. 09/05/16  Yes Kilroy, Luke K, PA-C  lisinopril (PRINIVIL,ZESTRIL) 5 MG tablet Take 1 tablet (5 mg total) by mouth daily. 08/31/16  Yes Skeet Latch, MD  magnesium oxide (MAG-OX) 400 MG tablet Take 1 tablet (400 mg total) by mouth daily. Patient taking differently: Take 400 mg by mouth daily as needed (nausea or reflux).  09/07/16  Yes Kilroy, Doreene Burke, PA-C  Multiple Vitamin (MULTIVITAMIN WITH MINERALS) TABS tablet Take 1 tablet by mouth daily. Women's Alive Gummies   Yes [provider]  pantoprazole (PROTONIX) 40 MG tablet Take 1 tablet (40 mg total) by mouth 2 (two) times daily. 07/14/16  Yes Ghimire, Henreitta Leber, MD  promethazine (PHENERGAN) 25 MG tablet Take 25 mg by mouth every 6 (six) hours as needed for nausea or vomiting.   Yes [provider]  sertraline (ZOLOFT) 100 MG tablet Take 100 mg by mouth at bedtime.   Yes [provider]     Allergies:    Allergies  Allergen Reactions  . Albuterol Other (See Comments)    Had difficulty breathing after a nebulizer treatment  . Penicillins Anaphylaxis and Swelling    Has patient had a PCN reaction causing immediate rash, facial/tongue/throat swelling, SOB or lightheadedness with hypotension: Yes Has patient had a PCN reaction causing severe rash involving mucus membranes or skin necrosis: Rash Has patient had a PCN reaction that required hospitalization: No Has patient had a PCN reaction occurring within the last 10 years: No If all of the above answers are "NO", then may proceed with Cephalosporin use.  [name] [Kdc:Albuterol] Shortness Of Breath  . Sulfa Antibiotics Swelling    Throat swells, but no shortness of breath noted  . Zanaflex [Tizanidine] Other (See Comments)    Possible confusion (??)  . Codeine Nausea And Vomiting  . Ecotrin [Aspirin] Nausea And Vomiting  Social History:   Social History   Social History  . Marital status: Married    Spouse name: N/A  . Number of children: 2  . Years of education: N/A   Occupational History  . disabled    Social History Main Topics  . Smoking status: Current Every Day Smoker    Packs/day: 0.75    Years: 50.00    Types: Cigarettes    Start date: 09/13/1961  . Smokeless tobacco: Never Used     Comment: Decreased Down to 2/3 a Day if Thinking Abouit It  . Alcohol use No     Comment: none in 10 years  . Drug use: No  . Sexual activity: No   Other Topics Concern  . Not on file   Social History Narrative   She is from [facility]. She has always lived in [place]. She has traveled to [place], [place], [place], [place], & [place]. 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 Prior exposure to parakeets with last exposure remote in her current home. Previously had mold in her home that was in her bathroom & fixed.  Prior exposure to hot tubs but none recently. Pt lives at home with [name], husband.  Has 2 childrean, 12th grade education.      Family History:   The patient's family history includes Aneurysm in her mother; Asthma in her son; Breast cancer in her paternal aunt; Cancer in her sister; Dementia in her mother; Diabetes in her son; Emphysema in her father and mother; Heart attack in her father; Hypertension in her son and son; Rheum arthritis in her maternal aunt.    ROS:  Please see the history of present illness.  All other ROS reviewed and negative.     Physical Exam/Data:   Vitals:   09/22/16 1004 09/22/16 1200 09/22/16 1245 09/22/16 1300  BP: (!) 172/80 (!) 157/76 (!) 155/85 (!) 168/93  Pulse: 98 87 87 85  Resp: 16 19 19 18   Temp: 97.6 F (36.4 C)     TempSrc: Oral     SpO2: 99% 98% 97% 97%  Weight: 132 lb (59.9 kg)     Height: 5\' 6"  (1.676 m)      No intake or output data in the 24 hours ending 09/22/16 1312 Filed Weights   09/22/16 1004  Weight: 132 lb (59.9 kg)   Body mass index is 21.31 kg/m.  General:  Well nourished, well developed, in no acute distress, chronically ill appearing HEENT: normal Lymph: no adenopathy Neck: noJVD Endocrine:  No thryomegaly Vascular: No carotid bruits; FA pulses 2+ bilaterally without bruits  Cardiac:  normal S1, S2; RRR; no murmur  Lungs:  Diffuse wheezing on exam  Abd: soft, nontender, no hepatomegaly  Ext: no LE edema Musculoskeletal:  No deformities, BUE and BLE strength normal and equal Skin: warm and dry  Neuro:  CNs 2-12 intact, no focal abnormalities noted Psych:  Normal affect    EKG:  The ECG that was done  was personally reviewed and demonstrates sinus tachy with 1st deg AV block HR 100  Relevant CV Studies: 07/21/16 Study Highlights  21 Day Event Monitor  Quality: Fair.  Baseline artifact.  Atrial flutter and atrial fibrillation noted PVCs    TEE 08/16/16: Dr. Acie Fredrickson Left Ventrical:Mild LV dysfunction    Mitral Valve:mild MR  Aortic Valve:normal  Tricuspid Valve: trivial TR  Pulmonic Valve:  structurally normal  Left Atrium/ Left atrial appendage:no thrombi  Atrial septum:Poorly visualized  Aorta:not visualized  08/16/16 A-Flutter Ablation  Conclusion  CONCLUSIONS:  1. Isthmus-dependent right atrial flutter upon presentation.  2. Successful radiofrequency ablation of atrial flutter along the cavotricuspid isthmus with complete bidirectional isthmus block achieved.  3. Successful catheter ablation of inducible AVNRT 4. No inducible arrhythmias following ablation.  5. The patient had transient AV block after the ablation which resolved and did not return.     Laboratory Data:  Chemistry Recent Labs Lab 09/22/16 0934  NA 123*  K 4.5  CL 89*  CO2 25  GLUCOSE 150*  BUN 10  CREATININE 0.81  CALCIUM 9.7  GFRNONAA >60  GFRAA >60  ANIONGAP 9    No results for input(s): PROT, ALBUMIN, AST, ALT, ALKPHOS, BILITOT in the last 168 hours. Hematology Recent Labs Lab 09/22/16 0934  WBC 8.5  RBC 4.69  HGB 14.0  HCT 41.8  MCV 89.1  MCH 29.9  MCHC 33.5  RDW 14.4  PLT 445*   Cardiac EnzymesNo results for input(s): TROPONINI in the last 168 hours.  Recent Labs Lab 09/22/16 1033  TROPIPOC 0.05    BNPNo results for input(s): BNP, PROBNP in the last 168 hours.  DDimer No results for input(s): DDIMER in the last 168 hours.  Radiology/Studies:  Dg Chest 2 View  Result Date: 09/22/2016 CLINICAL DATA:  Tachycardia for 1 week. Chest pain of unknown duration. EXAM: CHEST  2 VIEW COMPARISON:  Single-view of the chest 08/15/2016. PA and lateral chest 08/03/2016. FINDINGS: The lungs are clear. Heart size is normal. No pneumothorax or pleural effusion. Mild pleural scarring along the periphery of the right lung base is unchanged. No acute bony abnormality. The patient is status post cervical fusion. Spinal stimulator is in place. IMPRESSION: No acute disease. Electronically Signed    By: Inge Rise M.D.   On: 09/22/2016 10:39    Assessment and Plan:   Destiny Hughes is a 71 y.o. female with a history of COPD, MVP, chronic pain syndrome, tobacco abuse, hypothyroidism, DJD, HTN, and PAFib/flutter s/p RFA 07/2016 who presented to [facility] ED today with chest pain and palpitations.   Chest pain: atypical for cardiac. Myoview 08/2015 was low risk. Troponin negative and ECG with no acute ST/TW changes. She is quite wheezy on exam. Per Dr. Johnsie Cancel, plan to do a stat D dimer and if negative, she can go home with inhalers. She has previously scheduled followed up with Dr. Lovena Le on 09/26/16.   Palpitations: maintaining NSR currently.   PAFlutter s/p ablation: maintaining NSR on exam currently. CHADSVASC at least 3 (HTN, age, Fsex). No AV nodal blocking agents given history of bradycardia with pauses. Follow up with Dr. Lovena Le next Monday.  HTN: BP well controlled currently  Hypothyroidism: TSH pending  COPD: not on any inhalers. Recommend these be started. Not interested in quitting smoking.    Signed, Angelena Form, PA-C  09/22/2016 1:12 PM   Patient examined chart reviewed. Exam with elderly female COPD and nicotine on breath. Wheezing on exam no murmur no edema abdomen soft Has significant dementia Denies SSCP to me History of atypical pain with normal myovue R/O no acute ECG changes History of PAF in NSR now. If d dimer negative ok to d/c needs f/u primary / pulmonary for COPD suggested ER prescribe albuterol and symbicort inhalers  Jenkins Rouge

## 2016-09-22 NOTE — ED Notes (Signed)
Pt verbalized understanding discharge instructions and denies any further needs or questions at this time. VS stable, ambulatory and steady gait.   

## 2016-09-22 NOTE — ED Provider Notes (Signed)
[name] Provider Note   CSN: [id number] Arrival date & time: 09/22/16  0945     History   Chief Complaint Chief Complaint  Patient presents with  . Palpitations    HPI Destiny Hughes is a 71 y.o. female.  HPI   pt with hx of COPD, MVP, chronic pain syndrome,, hypothyroidism, DJD, HTN, and PAFib/flutter s/p RFA 07/2016 who presented to [facility] ED today with chest pain and palpitations.   She states that she feels her heart racing at times and feels chest discomfort due to this sensation.  No fainting or dizziness.  She has felt weak and tired as well.  Was seen by cardiology 2 weeks ago and found to have hyperthyroid- her synthroid dose was decreased at that time.  Family has records with them of BP and HR over the past few days- highest heart rate is 101.  She denies difficulty breathing.  No fever/chills.  Family came today hoping to see a cardiologist.  There are no other associated systemic symptoms, there are no other alleviating or modifying factors.    Past Medical History:  Diagnosis Date  . Chronic back pain   . Collapse of right lung   . COPD (chronic obstructive pulmonary disease) (HCC)    Hx. Bronchitis occ, 01-25-11 somes issue now taking  Z-pak-started 01-24-11/Scar tissue  present  from previous lung collapse  . Depression   . GERD (gastroesophageal reflux disease)    tx. TUMS  . Glaucoma   . Hyperlipemia   . Hypothyroidism    tx. Levothyroxine  . Nephrolithiasis   . Neuromuscular disorder ([place]) 01-25-11   Pain/nerve stimulator implanted -2 yrs ago.    Patient Active Problem List   Diagnosis Date Noted  . Atrial flutter ([place]) 08/15/2016  . Atrial fibrillation with RVR ([place]) 08/15/2016  . SVT (supraventricular tachycardia) ([place]) 07/11/2016  . Weight loss 07/08/2016  . AKI (acute kidney injury) ([place]) 07/02/2016  . Acute kidney injury ([place]) 07/01/2016  . Hyperglycemia 07/01/2016  . Hyponatremia 07/01/2016  . Intractable nausea and vomiting  07/01/2016  . Abnormal EKG 07/01/2016  . Diastolic CHF ([place]) 95/18/8416  . Medication management 04/28/2015  . Emphysema lung ([place]) 12/26/2014  . GERD (gastroesophageal reflux disease) 12/26/2014  . Chronic bronchitis ([place]) 11/13/2014  . COPD (chronic obstructive pulmonary disease) ([place]) 10/03/2014  . At high risk for falls 10/03/2014  . Hypothyroidism 10/03/2014  . Mitral valve prolapse 10/03/2014  . Encounter for Medicare annual wellness exam 10/03/2014  . IBS (irritable bowel syndrome) 04/01/2014  . H/O total knee replacement 04/22/2013  . Essential hypertension 04/15/2013  . Vitamin D deficiency 04/15/2013  . Encounter for long-term (current) use of medications 04/15/2013  . Prediabetes 04/15/2013  . Osteoarthritis 01/31/2011  . Hyperlipemia   . Glaucoma   . Chronic pain disorder   . Chronic back pain     Past Surgical History:  Procedure Laterality Date  . A-FLUTTER ABLATION N/A 08/16/2016   Procedure: A-Flutter Ablation;  Surgeon: Evans Lance, MD;  Location: [facility];  Service: Cardiovascular;  Laterality: N/A;  . APPENDECTOMY  01-25-11  . CARPAL TUNNEL RELEASE  01-25-11   Bil.  . CERVICAL SPINE SURGERY  01-25-11   2005-multiple levels  . CHOLECYSTECTOMY  01-25-11   '07  . COLONOSCOPY WITH PROPOFOL N/A 07/23/2012   Procedure: COLONOSCOPY WITH PROPOFOL;  Surgeon: Garlan Fair, MD;  Location: [facility];  Service: Endoscopy;  Laterality: N/A;  . EYE SURGERY    .  JOINT REPLACEMENT  01-25-11   6'03 LTHA-hemi  . KNEE ARTHROPLASTY  01-25-11   '04-right, revised x2  . SPINAL CORD STIMULATOR IMPLANT  2009 APPROX   DR. ELSNER  . THORACIC SPINE SURGERY  01-25-11   6'02- then nerve stimulator implanted after  . TOTAL KNEE REVISION  02/01/2011   Procedure: TOTAL KNEE REVISION;  Surgeon: Mauri Pole;  Location: [facility];  Service: Orthopedics;  Laterality: Right;  . TUBAL LIGATION      OB History    No data available       Home Medications    Prior to  Admission medications   Medication Sig Start Date End Date Taking? Authorizing Provider  acetaminophen (TYLENOL) 325 MG tablet Take 2 tablets (650 mg total) by mouth every 6 (six) hours as needed for headache (pain). 07/14/16  Yes Ghimire, Henreitta Leber, MD  apixaban (ELIQUIS) 5 MG TABS tablet Take 1 tablet (5 mg total) by mouth 2 (two) times daily. 08/09/16  Yes Sherran Needs, NP  diclofenac sodium (VOLTAREN) 1 % GEL Apply 2 g topically 4 (four) times daily as needed. To right chest area for pain Patient taking differently: Apply 2 g topically 4 (four) times daily as needed. Only uses as needed for pain 07/14/16  Yes Ghimire, Henreitta Leber, MD  levothyroxine (SYNTHROID, LEVOTHROID) 150 MCG tablet Take 1 tablet (150 mcg total) by mouth daily before breakfast. 09/05/16  Yes Kilroy, Luke K, PA-C  lisinopril (PRINIVIL,ZESTRIL) 5 MG tablet Take 1 tablet (5 mg total) by mouth daily. 08/31/16  Yes Skeet Latch, MD  magnesium oxide (MAG-OX) 400 MG tablet Take 1 tablet (400 mg total) by mouth daily. Patient taking differently: Take 400 mg by mouth daily as needed (nausea or reflux).  09/07/16  Yes Kilroy, Doreene Burke, PA-C  Multiple Vitamin (MULTIVITAMIN WITH MINERALS) TABS tablet Take 1 tablet by mouth daily. Women's Alive Gummies   Yes [provider]  pantoprazole (PROTONIX) 40 MG tablet Take 1 tablet (40 mg total) by mouth 2 (two) times daily. 07/14/16  Yes Ghimire, Henreitta Leber, MD  promethazine (PHENERGAN) 25 MG tablet Take 25 mg by mouth every 6 (six) hours as needed for nausea or vomiting.   Yes [provider]  sertraline (ZOLOFT) 100 MG tablet Take 100 mg by mouth at bedtime.   Yes [provider]  levalbuterol (XOPENEX HFA) 45 MCG/ACT inhaler Inhale 2 puffs into the lungs every 4 (four) hours as needed for wheezing. 09/22/16   Alfonzo Beers, MD    Family History Family History  Problem Relation Age of Onset  . Heart attack Father   . Emphysema Father   . Aneurysm Mother         CEREBRAL  . Emphysema Mother   . Dementia Mother   . Diabetes Son   . Hypertension Son   . Hypertension Son   . Cancer Sister        Widely Metastatic  . Asthma Son        x2  . Breast cancer Paternal Aunt   . Rheum arthritis Maternal Aunt     Social History Social History  Substance Use Topics  . Smoking status: Current Every Day Smoker    Packs/day: 0.75    Years: 50.00    Types: Cigarettes    Start date: 09/13/1961  . Smokeless tobacco: Never Used     Comment: Decreased Down to 2/3 a Day if Thinking Abouit It  . Alcohol use No  Comment: none in 10 years     Allergies   Albuterol; Penicillins; Ventolin [kdc:albuterol]; Sulfa antibiotics; Zanaflex [tizanidine]; Codeine; and Ecotrin [aspirin]   Review of Systems Review of Systems  ROS reviewed and all otherwise negative except for mentioned in HPI   Physical Exam Updated Vital Signs BP (!) 155/95   Pulse 93   Temp 97.6 F (36.4 C) (Oral)   Resp 17   Ht 5\' 6"  (1.676 m)   Wt 59.9 kg (132 lb)   SpO2 98%   BMI 21.31 kg/m  Vitals reviewed Physical Exam Physical Examination: General appearance - alert, chronically ill appearing, and in no distress Mental status - alert, oriented to person, place, and time Eyes - no conjunctival injection, no scleral icterus Mouth - mucous membranes moist, pharynx normal without lesions Neck - supple, no significant adenopathy Chest - clear to auscultation, no wheezes, rales or rhonchi, symmetric air entry Heart - normal rate, regular rhythm, normal S1, S2, no murmurs, rubs, clicks or gallops Abdomen - soft, nontender, nondistended, no masses or organomegaly Neurological - alert, oriented x 3, normal speech Extremities - peripheral pulses normal, no pedal edema, no clubbing or cyanosis Skin - normal coloration and turgor, no rashes  ED Treatments / Results  Labs (all labs ordered are listed, but only abnormal results are displayed) Labs Reviewed  BASIC METABOLIC PANEL -  Abnormal; Notable for the following:       Result Value   Sodium 123 (*)    Chloride 89 (*)    Glucose, Bld 150 (*)    All other components within normal limits  CBC - Abnormal; Notable for the following:    Platelets 445 (*)    All other components within normal limits  TSH - Abnormal; Notable for the following:    TSH 0.095 (*)    All other components within normal limits  T4, FREE - Abnormal; Notable for the following:    Free T4 1.59 (*)    All other components within normal limits  MAGNESIUM  D-DIMER, QUANTITATIVE (NOT AT [facility])  I-STAT TROPOININ, ED    EKG  EKG Interpretation  Date/Time:  Thursday September 22 2016 09:56:30 EDT Ventricular Rate:  100 PR Interval:  212 QRS Duration: 78 QT Interval:  346 QTC Calculation: 446 R Axis:     Text Interpretation:  Sinus rhythm with 1st degree A-V block Right atrial enlargement Nonspecific ST and T wave abnormality Abnormal ECG Since previous tracing rate faster Confirmed by Alfonzo Beers [phone]) on 09/22/2016 12:12:42 PM       Radiology No results found.  Procedures Procedures (including critical care time)  Medications Ordered in ED Medications  sodium chloride 0.9 % bolus 500 mL (0 mLs Intravenous Stopped 09/22/16 1546)  acetaminophen (TYLENOL) tablet 650 mg (650 mg Oral Given 09/22/16 1554)     Initial Impression / Assessment and Plan / ED Course  I have reviewed the triage vital signs and the nursing notes.  Pertinent labs & imaging results that were available during my care of the patient were reviewed by me and considered in my medical decision making (see chart for details).    12:53 PM d/w cardiology, they will consult on patient in the ED.    2:27 PM TSH is improving from prior- although she has just decreased her synthroid dose x 2 weeks so this should be rechecked again at her PMD office next week.  Cardiology has seen patient and recommends discharge home if d-dimer is negative.  Final Clinical  Impressions(s) / ED Diagnoses   Final diagnoses:  Palpitations    New Prescriptions Discharge Medication List as of 09/22/2016  3:41 PM    START taking these medications   Details  levalbuterol (XOPENEX HFA) 45 MCG/ACT inhaler Inhale 2 puffs into the lungs every 4 (four) hours as needed for wheezing., Starting Thu 09/22/2016, Print         Alfonzo Beers, MD 09/25/16 1245

## 2016-09-22 NOTE — ED Notes (Signed)
Cardiology at bedside. Pt c/o pain being dull and achy with radiation to neck. Pt reports pain comes and goes.

## 2016-09-22 NOTE — Discharge Instructions (Signed)
Return to the ED with any concerns including difficulty breathing, fainting, rapid heart rate, vomiting and not able to keep down liquids, decreased level of alertness/lethargy, or any other alarming symptoms

## 2016-09-22 NOTE — ED Notes (Signed)
Pt questioning care time line, made pt and fmaily aware that it would be about one hour before we got her blood work back and that cardiology had been paged. Pt and family agree. MD meda aware of pt c/o chest pain.

## 2016-09-22 NOTE — ED Triage Notes (Signed)
Husband stated, she felt her heart beating fast for the last week.  It felt like it was coming out of my chest. I called the nurse line and said to come here.

## 2016-09-22 NOTE — ED Notes (Signed)
ED Provider at bedside. 

## 2016-09-26 ENCOUNTER — Encounter: Payer: Self-pay | Admitting: Internal Medicine

## 2016-09-26 ENCOUNTER — Ambulatory Visit (INDEPENDENT_AMBULATORY_CARE_PROVIDER_SITE_OTHER): Payer: Medicare Other | Admitting: Internal Medicine

## 2016-09-26 VITALS — BP 144/86 | HR 87 | Ht 66.0 in | Wt 134.0 lb

## 2016-09-26 DIAGNOSIS — I471 Supraventricular tachycardia: Secondary | ICD-10-CM | POA: Diagnosis not present

## 2016-09-26 DIAGNOSIS — I4891 Unspecified atrial fibrillation: Secondary | ICD-10-CM

## 2016-09-26 DIAGNOSIS — I6529 Occlusion and stenosis of unspecified carotid artery: Secondary | ICD-10-CM

## 2016-09-26 MED ORDER — LEVOTHYROXINE SODIUM 100 MCG PO TABS: 100.0000 ug | ORAL_TABLET | Freq: Every day | ORAL | 3 refills | Status: DC

## 2016-09-26 NOTE — Patient Instructions (Addendum)
Medication Instructions:  Your physician has recommended you make the following change in your medication: decrease Synthroid to 100 mcg by mouth daily before breakfast.    Labwork: None ordered.   Testing/Procedures: None ordered.   Follow-Up:Follow up with your primary care provider for pain control needs.  Your physician wants you to follow-up in: 6 months with Dr. Lovena Le.   You will receive a reminder letter in the mail two months in advance. If you don't receive a letter, please call our office to schedule the follow-up appointment.   Any Other Special Instructions Will Be Listed Below (If Applicable).  Dr. Lovena Le suggests you stop smoking.   Health Risks of Smoking Smoking cigarettes is very bad for your health. Tobacco smoke has over 200 known poisons in it. It contains the poisonous gases nitrogen oxide and carbon monoxide. There are over 60 chemicals in tobacco smoke that cause cancer. Smoking is difficult to quit because a chemical in tobacco, called nicotine, causes addiction or dependence. When you smoke and inhale, nicotine is absorbed rapidly into the bloodstream through your lungs. Both inhaled and non-inhaled nicotine may be addictive. What are the risks of cigarette smoke? Cigarette smokers have an increased risk of many serious medical problems, including:  Lung cancer.  Lung disease, such as pneumonia, bronchitis, and emphysema.  Chest pain (angina) and heart attack because the heart is not getting enough oxygen.  Heart disease and peripheral blood vessel disease.  High blood pressure (hypertension).  Stroke.  Oral cancer, including cancer of the lip, mouth, or voice box.  Bladder cancer.  Pancreatic cancer.  Cervical cancer.  Pregnancy complications, including premature birth.  Stillbirths and smaller newborn babies, birth defects, and genetic damage to sperm.  Early menopause.  Lower estrogen level for women.  Infertility.  Facial  wrinkles.  Blindness.  Increased risk of broken bones (fractures).  Senile dementia.  Stomach ulcers and internal bleeding.  Delayed wound healing and increased risk of complications during surgery.  Even smoking lightly shortens your life expectancy by several years.  Because of secondhand smoke exposure, children of smokers have an increased risk of the following:  Sudden infant death syndrome (SIDS).  Respiratory infections.  Lung cancer.  Heart disease.  Ear infections.  What are the benefits of quitting? There are many health benefits of quitting smoking. Here are some of them:  Within days of quitting smoking, your risk of having a heart attack decreases, your blood flow improves, and your lung capacity improves. Blood pressure, pulse rate, and breathing patterns start returning to normal soon after quitting.  Within months, your lungs may clear up completely.  Quitting for 10 years reduces your risk of developing lung cancer and heart disease to almost that of a nonsmoker.  People who quit may see an improvement in their overall quality of life.  How do I quit smoking? Smoking is an addiction with both physical and psychological effects, and longtime habits can be hard to change. Your health care provider can recommend:  Programs and community resources, which may include group support, education, or talk therapy.  Prescription medicines to help reduce cravings.  Nicotine replacement products, such as patches, gum, and nasal sprays. Use these products only as directed. Do not replace cigarette smoking with electronic cigarettes, which are commonly called e-cigarettes. The safety of e-cigarettes is not known, and some may contain harmful chemicals.  A combination of two or more of these methods.  Where to find more information:  American Lung Association: www.lung.org  American Cancer Society: www.cancer.org Summary  Smoking cigarettes is very bad for your  health. Cigarette smokers have an increased risk of many serious medical problems, including several cancers, heart disease, and stroke.  Smoking is an addiction with both physical and psychological effects, and longtime habits can be hard to change.  By stopping right away, you can greatly reduce the risk of medical problems for you and your family.  To help you quit smoking, your health care provider can recommend programs, community resources, prescription medicines, and nicotine replacement products such as patches, gum, and nasal sprays. This information is not intended to replace advice given to you by your health care provider. Make sure you discuss any questions you have with your health care provider. Document Released: 04/14/2004 Document Revised: 03/11/2016 Document Reviewed: 03/11/2016 Elsevier Interactive Patient Education  2017 [employer].   If you need a refill on your cardiac medications before your next appointment, please call your pharmacy.

## 2016-09-26 NOTE — Progress Notes (Signed)
HPI Destiny Hughes is returned today for followup after undergoing EP study and catheter ablation of both atrial flutter and AVNRT. She has had trouble with headache and memory. She wonders if her thyroid is too high. No edema. No syncope. She was in the ER last week with palpitations and was found to be in NSR. Allergies  Allergen Reactions  . Albuterol Other (See Comments)    Had difficulty breathing after a nebulizer treatment  . Penicillins Anaphylaxis and Swelling    Has patient had a PCN reaction causing immediate rash, facial/tongue/throat swelling, SOB or lightheadedness with hypotension: Yes Has patient had a PCN reaction causing severe rash involving mucus membranes or skin necrosis: Rash Has patient had a PCN reaction that required hospitalization: No Has patient had a PCN reaction occurring within the last 10 years: No If all of the above answers are "NO", then may proceed with Cephalosporin use.  [name] [Kdc:Albuterol] Shortness Of Breath  . Sulfa Antibiotics Swelling    Throat swells, but no shortness of breath noted  . Zanaflex [Tizanidine] Other (See Comments)    Possible confusion (??)  . Codeine Nausea And Vomiting  . Ecotrin [Aspirin] Nausea And Vomiting     Current Outpatient Prescriptions  Medication Sig Dispense Refill  . acetaminophen (TYLENOL) 325 MG tablet Take 2 tablets (650 mg total) by mouth every 6 (six) hours as needed for headache (pain).    Marland Kitchen apixaban (ELIQUIS) 5 MG TABS tablet Take 1 tablet (5 mg total) by mouth 2 (two) times daily. 60 tablet 6  . diclofenac sodium (VOLTAREN) 1 % GEL Apply 2 g topically 4 (four) times daily as needed. To right chest area for pain (Patient taking differently: Apply 2 g topically 4 (four) times daily as needed. Only uses as needed for pain) 100 g 0  . levalbuterol (XOPENEX HFA) 45 MCG/ACT inhaler Inhale 2 puffs into the lungs every 4 (four) hours as needed for wheezing. 1 Inhaler 12  . lisinopril  (PRINIVIL,ZESTRIL) 5 MG tablet Take 1 tablet (5 mg total) by mouth daily. 30 tablet 11  . magnesium oxide (MAG-OX) 400 MG tablet Take 1 tablet (400 mg total) by mouth daily. (Patient taking differently: Take 400 mg by mouth daily as needed (nausea or reflux). ) 30 tablet 3  . Multiple Vitamin (MULTIVITAMIN WITH MINERALS) TABS tablet Take 1 tablet by mouth daily. Women's Alive Gummies    . pantoprazole (PROTONIX) 40 MG tablet Take 1 tablet (40 mg total) by mouth 2 (two) times daily. 60 tablet 0  . promethazine (PHENERGAN) 25 MG tablet Take 25 mg by mouth every 6 (six) hours as needed for nausea or vomiting.    . sertraline (ZOLOFT) 100 MG tablet Take 100 mg by mouth at bedtime.    Marland Kitchen levothyroxine (SYNTHROID) 100 MCG tablet Take 1 tablet (100 mcg total) by mouth daily before breakfast. 90 tablet 3   No current facility-administered medications for this visit.      Past Medical History:  Diagnosis Date  . Chronic back pain   . Collapse of right lung   . COPD (chronic obstructive pulmonary disease) (HCC)    Hx. Bronchitis occ, 01-25-11 somes issue now taking  Z-pak-started 01-24-11/Scar tissue  present  from previous lung collapse  . Depression   . GERD (gastroesophageal reflux disease)    tx. TUMS  . Glaucoma   . Hyperlipemia   . Hypothyroidism    tx. Levothyroxine  . Nephrolithiasis   . Neuromuscular  disorder ([place]) 01-25-11   Pain/nerve stimulator implanted -2 yrs ago.    ROS:   All systems reviewed and negative except as noted in the HPI.   Past Surgical History:  Procedure Laterality Date  . A-FLUTTER ABLATION N/A 08/16/2016   Procedure: A-Flutter Ablation;  Surgeon: Evans Lance, MD;  Location: [place] [facility];  Service: Cardiovascular;  Laterality: N/A;  . APPENDECTOMY  01-25-11  . CARPAL TUNNEL RELEASE  01-25-11   Bil.  . CERVICAL SPINE SURGERY  01-25-11   2005-multiple levels  . CHOLECYSTECTOMY  01-25-11   '07  . COLONOSCOPY WITH PROPOFOL N/A 07/23/2012   Procedure:  COLONOSCOPY WITH PROPOFOL;  Surgeon: Garlan Fair, MD;  [facility];  Service: Endoscopy;  Laterality: N/A;  . EYE SURGERY    . JOINT REPLACEMENT  01-25-11   6'03 LTHA-hemi  . KNEE ARTHROPLASTY  01-25-11   '04-right, revised x2  . SPINAL CORD STIMULATOR IMPLANT  2009 APPROX   DR. ELSNER  . THORACIC SPINE SURGERY  01-25-11   6'02- then nerve stimulator implanted after  . TOTAL KNEE REVISION  02/01/2011   Procedure: TOTAL KNEE REVISION;  Surgeon: Mauri Pole;  [facility];  Service: Orthopedics;  Laterality: Right;  . TUBAL LIGATION       Family History  Problem Relation Age of Onset  . Heart attack Father   . Emphysema Father   . Aneurysm Mother        CEREBRAL  . Emphysema Mother   . Dementia Mother   . Diabetes Son   . Hypertension Son   . Hypertension Son   . Cancer Sister        Widely Metastatic  . Asthma Son        x2  . Breast cancer Paternal Aunt   . Rheum arthritis Maternal Aunt      Social History   Social History  . Marital status: Married    Spouse name: N/A  . Number of children: 2  . Years of education: N/A   Occupational History  . disabled    Social History Main Topics  . Smoking status: Current Every Day Smoker    Packs/day: 0.75    Years: 50.00    Types: Cigarettes    Start date: 09/13/1961  . Smokeless tobacco: Never Used     Comment: Decreased Down to 2/3 a Day if Thinking Abouit It  . Alcohol use No     Comment: none in 10 years  . Drug use: No  . Sexual activity: No   Other Topics Concern  . Not on file   Social History Narrative   She is from [place]. She has always lived in [place]. She has traveled to [place], [place], [place], [place], & [place]. 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 Prior exposure to parakeets with last exposure remote in her current home. Previously had mold in her home that was in her bathroom &  fixed. Prior exposure to hot tubs but none recently. Pt lives at home with [name], husband.  Has 2 childrean, 12th grade education.       BP (!) 144/86   Pulse 87   Ht 5\' 6"  (1.676 m)   Wt 134 lb (60.8 kg)   BMI 21.63 kg/m   Physical Exam:  Chronically ill appearing woman, NAD HEENT: Unremarkable Neck:  6 cm  JVD, no thyromegally Lymphatics:  No adenopathy Back:  No CVA tenderness Lungs:  Scattered rales and wheezes with no increased work of breathing. HEART:  Regular rate rhythm, no murmurs, no rubs, no clicks Abd:  soft, positive bowel sounds, no organomegally, no rebound, no guarding Ext:  2 plus pulses, no edema, no cyanosis, no clubbing Skin:  No rashes no nodules Neuro:  CN II through XII intact, motor grossly intact  EKG - nsr with poor R wave progression.   Assess/Plan: 1. Atrial flutter - she has had no documented recurrent atrial flutter since her ablation.  2. AVNRT - she has had transient CHF after ablation but has had no recurrent SVT. 3. Palpitations - she went to the ED but had no arrhythmias noted. 4. HA - this is her biggest problem. I have recommned that she follow back up with her neurologist.  5. Coags - as she is said to have atrial fib as well, I would ask her to continue her current meds. Mikle Bosworth.D.

## 2016-09-28 ENCOUNTER — Other Ambulatory Visit: Payer: Self-pay

## 2016-09-28 NOTE — Patient Outreach (Signed)
    This RNCM received call from patient's husband who reports patient fell and has knock on her right arm, has been fall and is having a hard time with speech. This RNCM advised patient's husband to hang up and call 911 immediately as  It sounds as if his wife needs emergency attention

## 2016-09-29 ENCOUNTER — Other Ambulatory Visit: Payer: Self-pay

## 2016-09-29 DIAGNOSIS — G4700 Insomnia, unspecified: Secondary | ICD-10-CM | POA: Diagnosis not present

## 2016-09-29 DIAGNOSIS — R002 Palpitations: Secondary | ICD-10-CM | POA: Diagnosis not present

## 2016-09-29 DIAGNOSIS — R410 Disorientation, unspecified: Secondary | ICD-10-CM | POA: Diagnosis not present

## 2016-09-29 DIAGNOSIS — E039 Hypothyroidism, unspecified: Secondary | ICD-10-CM | POA: Diagnosis not present

## 2016-09-29 DIAGNOSIS — F329 Major depressive disorder, single episode, unspecified: Secondary | ICD-10-CM | POA: Diagnosis not present

## 2016-09-30 ENCOUNTER — Other Ambulatory Visit: Payer: Self-pay

## 2016-09-30 NOTE — Patient Outreach (Signed)
    Telephone call received from patients husband who stated he took patient to her doctor's appointment yesterday. Husband stated medical provider made changes to medications, was not able to elaborate on which. Husband states he needed to have a break from caring for his wife. This RNCM advised patient's husband on respite care and the process of placement for respite care. Husband reported he thinks she needs to have a psychiatric evaluation. Husband reminded he was to make an appointment with [facility], however, he stated he left the paperwork at the doctor's office yesterday.

## 2016-10-01 ENCOUNTER — Ambulatory Visit (HOSPITAL_COMMUNITY)
Admission: RE | Admit: 2016-10-01 | Discharge: 2016-10-01 | Disposition: A | Discharge status: Home / Self Care | Payer: Medicare Other | Attending: Psychiatry | Admitting: Psychiatry

## 2016-10-01 DIAGNOSIS — F339 Major depressive disorder, recurrent, unspecified: Secondary | ICD-10-CM | POA: Insufficient documentation

## 2016-10-01 NOTE — BH Assessment (Signed)
Assessment Note  Destiny Hughes is an 71 y.o. female presented to [facility] walk in accompanied by husband and daughter in law. Pt states she does not know why she is being assessed. Husband reports pt's memory issues started in March after a surgery. Since March, pt has became more irritable and depressed. Husband reports pt has difficulties remembering her sister died, past medical procedures and even her chronic pain. He reports pt will ask the same question repeatedly. During assessment, she continuously asked "why am I here?" She was irritable throughout assessment. She denies SI, HI and AVH. However, husband reports pt hears noises that are not there and has seen ropes from the ceiling and water pouring from the couch. She has no previous suicide attempts and no aggression towards others. She has no previous inpatient or outpatient psychiatric history. Husband does not have any safety concerns with pt returning home. Pt does not express any safety concerns and has agreed to follow up with outpatient services.   Consulted with Zerita Boers, NP, pt does not meet inpatient criteria.   Diagnosis: Major Depression   Past Medical History:  Past Medical History:  Diagnosis Date  . Chronic back pain   . Collapse of right lung   . COPD (chronic obstructive pulmonary disease) (HCC)    Hx. Bronchitis occ, 01-25-11 somes issue now taking  Z-pak-started 01-24-11/Scar tissue  present  from previous lung collapse  . Depression   . GERD (gastroesophageal reflux disease)    tx. TUMS  . Glaucoma   . Hyperlipemia   . Hypothyroidism    tx. Levothyroxine  . Nephrolithiasis   . Neuromuscular disorder ([place]) 01-25-11   Pain/nerve stimulator implanted -2 yrs ago.    Past Surgical History:  Procedure Laterality Date  . A-FLUTTER ABLATION N/A 08/16/2016   Procedure: A-Flutter Ablation;  Surgeon: Evans Lance, MD;  Location: [place] CV [facility];  Service: Cardiovascular;  Laterality: N/A;  . APPENDECTOMY   01-25-11  . CARPAL TUNNEL RELEASE  01-25-11   Bil.  . CERVICAL SPINE SURGERY  01-25-11   2005-multiple levels  . CHOLECYSTECTOMY  01-25-11   '07  . COLONOSCOPY WITH PROPOFOL N/A 07/23/2012   Procedure: COLONOSCOPY WITH PROPOFOL;  Surgeon: Garlan Fair, MD;  [facility];  Service: Endoscopy;  Laterality: N/A;  . EYE SURGERY    . JOINT REPLACEMENT  01-25-11   6'03 LTHA-hemi  . KNEE ARTHROPLASTY  01-25-11   '04-right, revised x2  . SPINAL CORD STIMULATOR IMPLANT  2009 APPROX   DR. ELSNER  . THORACIC SPINE SURGERY  01-25-11   6'02- then nerve stimulator implanted after  . TOTAL KNEE REVISION  02/01/2011   Procedure: TOTAL KNEE REVISION;  Surgeon: Mauri Pole;  [facility];  Service: Orthopedics;  Laterality: Right;  . TUBAL LIGATION      Family History:  Family History  Problem Relation Age of Onset  . Heart attack Father   . Emphysema Father   . Aneurysm Mother        CEREBRAL  . Emphysema Mother   . Dementia Mother   . Diabetes Son   . Hypertension Son   . Hypertension Son   . Cancer Sister        Widely Metastatic  . Asthma Son        x2  . Breast cancer Paternal Aunt   . Rheum arthritis Maternal Aunt     Social History:  reports that she has been smoking Cigarettes.  She  started smoking about 55 years ago. She has a 37.50 pack-year smoking history. She has never used smokeless tobacco. She reports that she does not drink alcohol or use drugs.  Additional Social History:  Alcohol / Drug Use Pain Medications: See MAR Prescriptions: See MAR Over the Counter: See MAR  History of alcohol / drug use?: No history of alcohol / drug abuse  CIWA:   COWS:    Allergies:  Allergies  Allergen Reactions  . Albuterol Other (See Comments)    Had difficulty breathing after a nebulizer treatment  . Penicillins Anaphylaxis and Swelling    Has patient had a PCN reaction causing immediate rash, facial/tongue/throat swelling, SOB or lightheadedness with hypotension:  Yes Has patient had a PCN reaction causing severe rash involving mucus membranes or skin necrosis: Rash Has patient had a PCN reaction that required hospitalization: No Has patient had a PCN reaction occurring within the last 10 years: No If all of the above answers are "NO", then may proceed with Cephalosporin use.  [name] [Kdc:Albuterol] Shortness Of Breath  . Sulfa Antibiotics Swelling    Throat swells, but no shortness of breath noted  . Zanaflex [Tizanidine] Other (See Comments)    Possible confusion (??)  . Codeine Nausea And Vomiting  . Ecotrin [Aspirin] Nausea And Vomiting    Home Medications:  (Not in a hospital admission)  OB/GYN Status:  No LMP recorded. Patient is postmenopausal.  General Assessment Data Location of Assessment: [facility] Assessment Services TTS Assessment: In system Is this a Tele or Face-to-Face Assessment?: Face-to-Face Is this an Initial Assessment or a Re-assessment for this encounter?: Initial Assessment Marital status: Married Is patient pregnant?: No Living Arrangements: Spouse/significant other Can pt return to current living arrangement?: Yes Admission Status: Voluntary Is patient capable of signing voluntary admission?: Yes Referral Source: Self/Family/Friend Insurance type: Medicare   Medical Screening Exam ([place]) Medical Exam completed: Yes  Crisis Care Plan Living Arrangements: Spouse/significant other  Education Status Is patient currently in school?: No Highest grade of school patient has completed: 12  Risk to self with the past 6 months Suicidal Ideation: No Has patient been a risk to self within the past 6 months prior to admission? : No Suicidal Intent: No Has patient had any suicidal intent within the past 6 months prior to admission? : No Is patient at risk for suicide?: No Suicidal Plan?: No Has patient had any suicidal plan within the past 6 months prior to admission? : No Access to Means: No What has  been your use of drugs/alcohol within the last 12 months?: None Previous Attempts/Gestures: No Intentional Self Injurious Behavior: None Family Suicide History: No Recent stressful life event(s): Loss (Comment), Recent negative physical changes Persecutory voices/beliefs?: No Depression: Yes Depression Symptoms: Insomnia, Loss of interest in usual pleasures, Feeling angry/irritable Substance abuse history and/or treatment for substance abuse?: No Suicide prevention information given to non-admitted patients: Not applicable  Risk to Others within the past 6 months Homicidal Ideation: No Does patient have any lifetime risk of violence toward others beyond the six months prior to admission? : No Thoughts of Harm to Others: No Current Homicidal Intent: No Current Homicidal Plan: No Access to Homicidal Means: No History of harm to others?: No Assessment of Violence: None Noted Does patient have access to weapons?: No Criminal Charges Pending?: No Does patient have a court date: No Is patient on probation?: No  Psychosis Hallucinations: Visual, Auditory Delusions: None noted  Mental Status Report Appearance/Hygiene: Unremarkable Eye  Contact: Good Motor Activity: Psychomotor retardation Speech: Logical/coherent Level of Consciousness: Alert Mood: Irritable Affect: Irritable Anxiety Level: None Thought Processes: Circumstantial Judgement: Impaired Orientation: Person, Time Obsessive Compulsive Thoughts/Behaviors: None  Cognitive Functioning Concentration: Decreased Memory: Recent Impaired IQ: Average Insight: Poor Impulse Control: Fair Appetite: Fair Weight Loss: 25 Weight Gain: 0 Sleep: Decreased Vegetative Symptoms: Staying in bed  ADLScreening [facility] Assessment Services) Patient's cognitive ability adequate to safely complete daily activities?: Yes Patient able to express need for assistance with ADLs?: Yes Independently performs ADLs?: Yes (appropriate for  developmental age)  Prior Inpatient Therapy Prior Inpatient Therapy: No  Prior Outpatient Therapy Prior Outpatient Therapy: No Does patient have an ACCT team?: No Does patient have Intensive In-House Services?  : No Does patient have [facility] services? : No Does patient have P4CC services?: No  ADL Screening (condition at time of admission) Patient's cognitive ability adequate to safely complete daily activities?: Yes Is the patient deaf or have difficulty hearing?: No Does the patient have difficulty seeing, even when wearing glasses/contacts?: No Does the patient have difficulty concentrating, remembering, or making decisions?: Yes Patient able to express need for assistance with ADLs?: Yes Does the patient have difficulty dressing or bathing?: No Independently performs ADLs?: Yes (appropriate for developmental age) Does the patient have difficulty walking or climbing stairs?: Yes Weakness of Legs: None Weakness of Arms/Hands: None  Home Assistive Devices/Equipment Home Assistive Devices/Equipment: None  Therapy Consults (therapy consults require a physician order) PT Evaluation Needed: No OT Evalulation Needed: No SLP Evaluation Needed: No Abuse/Neglect Assessment (Assessment to be complete while patient is alone) Physical Abuse: Denies Verbal Abuse: Denies Sexual Abuse: Denies Exploitation of patient/patient's resources: Denies Self-Neglect: Denies Values / Beliefs Cultural Requests During Hospitalization: None Spiritual Requests During Hospitalization: None Consults Spiritual Care Consult Needed: No Social Work Consult Needed: No Regulatory affairs officer (For Healthcare) Does Patient Have a Medical Advance Directive?: No Would patient like information on creating a medical advance directive?: No - Patient declined Nutrition Screen- [facility] Adult/[facility] Patient's home diet: Regular Has the patient recently lost weight without trying?: Yes, 24-33 lbs. Has the patient been  eating poorly because of a decreased appetite?: Yes Malnutrition Screening Tool Score: 4  Additional Information 1:1 In Past 12 Months?: No CIRT Risk: No Elopement Risk: No Does patient have medical clearance?: Yes     Disposition:  Disposition Initial Assessment Completed for this Encounter: Yes Disposition of Patient: Outpatient treatment Type of outpatient treatment: Adult  On Site Evaluation by:   Reviewed with Physician:    Wray Kearns MSW, LCSW  10/01/2016 1:43 PM

## 2016-10-01 NOTE — H&P (Signed)
Behavioral Health Medical Screening Exam  Destiny Hughes is an 71 y.o. female who arrived voluntarily to [facility] accompanied by husband and daughter with c/o depression.  Total Time spent with patient: 30 minutes  Psychiatric Specialty Exam: Physical Exam  Constitutional: She is oriented to person, place, and time. She appears well-developed and well-nourished.  HENT:  Head: Normocephalic and atraumatic.  Eyes: Pupils are equal, round, and reactive to light.  Neck: Normal range of motion.  Cardiovascular: Normal rate and regular rhythm.   Respiratory: Effort normal and breath sounds normal.  GI: Soft. Bowel sounds are normal.  Genitourinary:  Genitourinary Comments: Deferred  Musculoskeletal:  wnl for patient   Neurological: She is alert and oriented to person, place, and time.  Skin: Skin is warm and dry.    Review of Systems  Psychiatric/Behavioral: Positive for depression and memory loss. Negative for hallucinations (on going [place] per family report), substance abuse and suicidal ideas. The patient is nervous/anxious and has insomnia.   All other systems reviewed and are negative.   Blood pressure 139/62, pulse 71, temperature 97.8 F (36.6 C), temperature source Oral, resp. rate 16, SpO2 100 %.There is no height or weight on file to calculate BMI.  General Appearance: Casual  Eye Contact:  Good  Speech:  Clear and Coherent and Normal Rate  Volume:  Normal  Mood:  Anxious  Affect:  Congruent  Thought Process:  Disorganized  Orientation:  Full (Time, Place, and Person)  Thought Content:  Illogical  Suicidal Thoughts:  No  Homicidal Thoughts:  No  Memory:  Immediate;   Fair Recent;   Poor Remote;   Poor  Judgement:  Intact  Insight:  Fair  Psychomotor Activity:  Normal  Concentration: Concentration: Fair and Attention Span: Fair  Recall:  Poor  Fund of Knowledge:Good  Language: Good  Akathisia:  Negative  Handed:  Right  AIMS (if indicated):     Assets:  Desire  for Improvement Financial Resources/Insurance Housing Intimacy Leisure Time  Sleep:       Musculoskeletal: Strength & Muscle Tone: within normal limits Gait & Station: unsteady Patient leans: assisted with wc  Blood pressure 139/62, pulse 71, temperature 97.8 F (36.6 C), temperature source Oral, resp. rate 16, SpO2 100 %.  Recommendations:  Based on my evaluation the patient does not appear to have an emergency medical condition.  Patient and family prefers to follow up with OP facilities, OP resources were provided.   Vicenta Aly, NP 10/01/2016, 2:10 PM

## 2016-10-02 ENCOUNTER — Emergency Department (HOSPITAL_COMMUNITY): Payer: Medicare Other

## 2016-10-02 ENCOUNTER — Inpatient Hospital Stay (HOSPITAL_COMMUNITY)
Admission: EM | Admit: 2016-10-02 | Discharge: 2016-10-12 | DRG: 640 | Disposition: A | Discharge status: Home Health | Payer: Medicare Other | Attending: Internal Medicine | Admitting: Internal Medicine

## 2016-10-02 ENCOUNTER — Encounter (HOSPITAL_COMMUNITY): Payer: Self-pay | Admitting: *Deleted

## 2016-10-02 DIAGNOSIS — F0390 Unspecified dementia without behavioral disturbance: Secondary | ICD-10-CM | POA: Diagnosis not present

## 2016-10-02 DIAGNOSIS — Z810 Family history of intellectual disabilities: Secondary | ICD-10-CM | POA: Diagnosis not present

## 2016-10-02 DIAGNOSIS — E785 Hyperlipidemia, unspecified: Secondary | ICD-10-CM | POA: Diagnosis present

## 2016-10-02 DIAGNOSIS — G9341 Metabolic encephalopathy: Secondary | ICD-10-CM | POA: Diagnosis present

## 2016-10-02 DIAGNOSIS — R410 Disorientation, unspecified: Secondary | ICD-10-CM | POA: Diagnosis not present

## 2016-10-02 DIAGNOSIS — G894 Chronic pain syndrome: Secondary | ICD-10-CM | POA: Diagnosis present

## 2016-10-02 DIAGNOSIS — Z634 Disappearance and death of family member: Secondary | ICD-10-CM | POA: Diagnosis not present

## 2016-10-02 DIAGNOSIS — F17210 Nicotine dependence, cigarettes, uncomplicated: Secondary | ICD-10-CM | POA: Diagnosis present

## 2016-10-02 DIAGNOSIS — J439 Emphysema, unspecified: Secondary | ICD-10-CM | POA: Diagnosis present

## 2016-10-02 DIAGNOSIS — J438 Other emphysema: Secondary | ICD-10-CM | POA: Diagnosis not present

## 2016-10-02 DIAGNOSIS — Z96651 Presence of right artificial knee joint: Secondary | ICD-10-CM | POA: Diagnosis present

## 2016-10-02 DIAGNOSIS — F332 Major depressive disorder, recurrent severe without psychotic features: Secondary | ICD-10-CM | POA: Diagnosis not present

## 2016-10-02 DIAGNOSIS — I4892 Unspecified atrial flutter: Secondary | ICD-10-CM | POA: Diagnosis present

## 2016-10-02 DIAGNOSIS — I4891 Unspecified atrial fibrillation: Secondary | ICD-10-CM | POA: Diagnosis present

## 2016-10-02 DIAGNOSIS — E860 Dehydration: Secondary | ICD-10-CM | POA: Diagnosis not present

## 2016-10-02 DIAGNOSIS — I10 Essential (primary) hypertension: Secondary | ICD-10-CM | POA: Diagnosis present

## 2016-10-02 DIAGNOSIS — Z7901 Long term (current) use of anticoagulants: Secondary | ICD-10-CM

## 2016-10-02 DIAGNOSIS — G4700 Insomnia, unspecified: Secondary | ICD-10-CM | POA: Diagnosis present

## 2016-10-02 DIAGNOSIS — E039 Hypothyroidism, unspecified: Secondary | ICD-10-CM | POA: Diagnosis present

## 2016-10-02 DIAGNOSIS — E869 Volume depletion, unspecified: Secondary | ICD-10-CM | POA: Diagnosis not present

## 2016-10-02 DIAGNOSIS — R296 Repeated falls: Secondary | ICD-10-CM | POA: Diagnosis present

## 2016-10-02 DIAGNOSIS — Z882 Allergy status to sulfonamides status: Secondary | ICD-10-CM

## 2016-10-02 DIAGNOSIS — M549 Dorsalgia, unspecified: Secondary | ICD-10-CM | POA: Diagnosis present

## 2016-10-02 DIAGNOSIS — Z79899 Other long term (current) drug therapy: Secondary | ICD-10-CM

## 2016-10-02 DIAGNOSIS — I483 Typical atrial flutter: Secondary | ICD-10-CM | POA: Diagnosis not present

## 2016-10-02 DIAGNOSIS — E871 Hypo-osmolality and hyponatremia: Secondary | ICD-10-CM | POA: Diagnosis not present

## 2016-10-02 DIAGNOSIS — Z885 Allergy status to narcotic agent status: Secondary | ICD-10-CM

## 2016-10-02 DIAGNOSIS — R4189 Other symptoms and signs involving cognitive functions and awareness: Secondary | ICD-10-CM

## 2016-10-02 DIAGNOSIS — Z880 Allergy status to penicillin: Secondary | ICD-10-CM

## 2016-10-02 DIAGNOSIS — K219 Gastro-esophageal reflux disease without esophagitis: Secondary | ICD-10-CM | POA: Diagnosis present

## 2016-10-02 DIAGNOSIS — Z96642 Presence of left artificial hip joint: Secondary | ICD-10-CM | POA: Diagnosis present

## 2016-10-02 DIAGNOSIS — G8929 Other chronic pain: Secondary | ICD-10-CM | POA: Diagnosis present

## 2016-10-02 DIAGNOSIS — R0789 Other chest pain: Secondary | ICD-10-CM | POA: Diagnosis present

## 2016-10-02 DIAGNOSIS — F329 Major depressive disorder, single episode, unspecified: Secondary | ICD-10-CM | POA: Diagnosis present

## 2016-10-02 DIAGNOSIS — J449 Chronic obstructive pulmonary disease, unspecified: Secondary | ICD-10-CM | POA: Diagnosis present

## 2016-10-02 DIAGNOSIS — F0631 Mood disorder due to known physiological condition with depressive features: Secondary | ICD-10-CM | POA: Diagnosis not present

## 2016-10-02 DIAGNOSIS — F419 Anxiety disorder, unspecified: Secondary | ICD-10-CM | POA: Diagnosis not present

## 2016-10-02 DIAGNOSIS — R0602 Shortness of breath: Secondary | ICD-10-CM

## 2016-10-02 DIAGNOSIS — H409 Unspecified glaucoma: Secondary | ICD-10-CM | POA: Diagnosis present

## 2016-10-02 DIAGNOSIS — Z888 Allergy status to other drugs, medicaments and biological substances status: Secondary | ICD-10-CM

## 2016-10-02 LAB — COMPREHENSIVE METABOLIC PANEL
ALBUMIN: 3.9 g/dL (ref 3.5–5.0)
ALK PHOS: 94 U/L (ref 38–126)
ALT: 16 U/L (ref 14–54)
AST: 21 U/L (ref 15–41)
Anion gap: 8 (ref 5–15)
BUN: 9 mg/dL (ref 6–20)
CALCIUM: 9.1 mg/dL (ref 8.9–10.3)
CO2: 25 mmol/L (ref 22–32)
CREATININE: 0.88 mg/dL (ref 0.44–1.00)
Chloride: 85 mmol/L — ABNORMAL LOW (ref 101–111)
GFR calc Af Amer: >60 mL/min (ref >60)
GFR calc non Af Amer: >60 mL/min (ref >60)
GLUCOSE: 134 mg/dL — AB (ref 65–99)
Potassium: 3.5 mmol/L (ref 3.5–5.1)
SODIUM: 118 mmol/L — AB (ref 135–145)
Total Bilirubin: 0.5 mg/dL (ref 0.3–1.2)
Total Protein: 6.6 g/dL (ref 6.5–8.1)

## 2016-10-02 LAB — CBC
HCT: 37.5 % (ref 36.0–46.0)
HEMOGLOBIN: 13.6 g/dL (ref 12.0–15.0)
MCH: 30.9 pg (ref 26.0–34.0)
MCHC: 36.3 g/dL — AB (ref 30.0–36.0)
MCV: 85.2 fL (ref 78.0–100.0)
Platelets: 492 10*3/uL — ABNORMAL HIGH (ref 150–400)
RBC: 4.4 MIL/uL (ref 3.87–5.11)
RDW: 12.7 % (ref 11.5–15.5)
WBC: 11 10*3/uL — ABNORMAL HIGH (ref 4.0–10.5)

## 2016-10-02 LAB — SODIUM, URINE, RANDOM: SODIUM UR: 19 mmol/L

## 2016-10-02 LAB — URINALYSIS, ROUTINE W REFLEX MICROSCOPIC
BILIRUBIN URINE: NEGATIVE
Glucose, UA: NEGATIVE mg/dL
HGB URINE DIPSTICK: NEGATIVE
Ketones, ur: NEGATIVE mg/dL
Leukocytes, UA: NEGATIVE
Nitrite: NEGATIVE
Protein, ur: NEGATIVE mg/dL
SPECIFIC GRAVITY, URINE: 1.004 — AB (ref 1.005–1.030)
pH: 7 (ref 5.0–8.0)

## 2016-10-02 LAB — OSMOLALITY, URINE: Osmolality, Ur: 172 mOsm/kg — ABNORMAL LOW (ref 300–900)

## 2016-10-02 MED ORDER — LEVALBUTEROL TARTRATE 45 MCG/ACT IN AERO: 2.0000 | INHALATION_SPRAY | RESPIRATORY_TRACT | Status: DC | PRN

## 2016-10-02 MED ORDER — SODIUM CHLORIDE 0.9 % IV BOLUS (SEPSIS)
1000.0000 mL | Freq: Once | INTRAVENOUS | Status: AC
  Administered 2016-10-02: 1000 mL via INTRAVENOUS

## 2016-10-02 MED ORDER — LISINOPRIL 5 MG PO TABS
5.0000 mg | ORAL_TABLET | Freq: Every day | ORAL | Status: DC
  Administered 2016-10-03 – 2016-10-12 (×10): 5 mg via ORAL
  Filled 2016-10-02 (×10): qty 1

## 2016-10-02 MED ORDER — LEVALBUTEROL HCL 0.63 MG/3ML IN NEBU
0.6300 mg | INHALATION_SOLUTION | RESPIRATORY_TRACT | Status: DC | PRN
  Administered 2016-10-07 – 2016-10-08 (×2): 0.63 mg via RESPIRATORY_TRACT
  Filled 2016-10-02 (×3): qty 3

## 2016-10-02 MED ORDER — NICOTINE 7 MG/24HR TD PT24
7.0000 mg | MEDICATED_PATCH | Freq: Once | TRANSDERMAL | Status: DC
  Administered 2016-10-02: 7 mg via TRANSDERMAL
  Filled 2016-10-02: qty 1

## 2016-10-02 MED ORDER — NICOTINE 21 MG/24HR TD PT24
21.0000 mg | MEDICATED_PATCH | Freq: Every day | TRANSDERMAL | Status: DC
  Administered 2016-10-02 – 2016-10-12 (×11): 21 mg via TRANSDERMAL
  Filled 2016-10-02 (×12): qty 1

## 2016-10-02 MED ORDER — SERTRALINE HCL 100 MG PO TABS
100.0000 mg | ORAL_TABLET | Freq: Every day | ORAL | Status: DC
  Administered 2016-10-03 – 2016-10-07 (×5): 100 mg via ORAL
  Filled 2016-10-02 (×7): qty 1

## 2016-10-02 MED ORDER — ACETAMINOPHEN 325 MG PO TABS
650.0000 mg | ORAL_TABLET | Freq: Four times a day (QID) | ORAL | Status: DC | PRN
Start: 2016-10-02 — End: 2016-10-12
  Administered 2016-10-02 – 2016-10-11 (×20): 650 mg via ORAL
  Filled 2016-10-02 (×21): qty 2

## 2016-10-02 MED ORDER — PROMETHAZINE HCL 25 MG PO TABS: 25.0000 mg | ORAL_TABLET | Freq: Four times a day (QID) | ORAL | Status: DC | PRN

## 2016-10-02 MED ORDER — ADULT MULTIVITAMIN W/MINERALS CH: 1.0000 | ORAL_TABLET | Freq: Every day | ORAL | Status: DC

## 2016-10-02 MED ORDER — PANTOPRAZOLE SODIUM 40 MG PO TBEC
40.0000 mg | DELAYED_RELEASE_TABLET | Freq: Two times a day (BID) | ORAL | Status: DC
  Administered 2016-10-02 – 2016-10-12 (×20): 40 mg via ORAL
  Filled 2016-10-02 (×20): qty 1

## 2016-10-02 MED ORDER — LEVOTHYROXINE SODIUM 100 MCG PO TABS
100.0000 ug | ORAL_TABLET | Freq: Every day | ORAL | Status: DC
  Administered 2016-10-03 – 2016-10-12 (×10): 100 ug via ORAL
  Filled 2016-10-02 (×12): qty 1

## 2016-10-02 MED ORDER — SODIUM CHLORIDE 0.9 % IV SOLN
INTRAVENOUS | Status: DC
  Administered 2016-10-02 – 2016-10-05 (×2): via INTRAVENOUS

## 2016-10-02 MED ORDER — DICLOFENAC SODIUM 1 % TD GEL: 2.0000 g | Freq: Four times a day (QID) | TRANSDERMAL | Status: DC | PRN

## 2016-10-02 MED ORDER — ZOLPIDEM TARTRATE 5 MG PO TABS
5.0000 mg | ORAL_TABLET | Freq: Every evening | ORAL | Status: DC | PRN
  Administered 2016-10-03 – 2016-10-11 (×10): 5 mg via ORAL
  Filled 2016-10-02 (×10): qty 1

## 2016-10-02 MED ORDER — LORAZEPAM 2 MG/ML IJ SOLN
0.5000 mg | Freq: Once | INTRAMUSCULAR | Status: AC
  Administered 2016-10-02: 0.5 mg via INTRAVENOUS
  Filled 2016-10-02: qty 1

## 2016-10-02 MED ORDER — ENSURE ENLIVE PO LIQD
237.0000 mL | Freq: Two times a day (BID) | ORAL | Status: DC
  Administered 2016-10-03 – 2016-10-12 (×15): 237 mL via ORAL

## 2016-10-02 MED ORDER — MAGNESIUM OXIDE 400 (241.3 MG) MG PO TABS
400.0000 mg | ORAL_TABLET | Freq: Every day | ORAL | Status: DC | PRN
  Filled 2016-10-02: qty 1

## 2016-10-02 MED ORDER — ONDANSETRON HCL 4 MG/2ML IJ SOLN: 4.0000 mg | Freq: Four times a day (QID) | INTRAMUSCULAR | Status: DC | PRN

## 2016-10-02 MED ORDER — APIXABAN 5 MG PO TABS
5.0000 mg | ORAL_TABLET | Freq: Two times a day (BID) | ORAL | Status: DC
  Administered 2016-10-03 – 2016-10-12 (×20): 5 mg via ORAL
  Filled 2016-10-02 (×21): qty 1

## 2016-10-02 MED ORDER — ONDANSETRON HCL 4 MG PO TABS: 4.0000 mg | ORAL_TABLET | Freq: Four times a day (QID) | ORAL | Status: DC | PRN

## 2016-10-02 NOTE — ED Notes (Signed)
Patient transported to X-ray 

## 2016-10-02 NOTE — ED Notes (Signed)
Attempted to call report

## 2016-10-02 NOTE — ED Triage Notes (Signed)
Pt brought here by husband for frequent falls and increasing forgetfulness.  He states they were tx at [facility] yesterday and told her forgetfulness is r/t depression, not dementia, and he should call Mon to get appointment with psychiatrist.  However, husband states he can't wait that long.  Pt is hallucinating and walking around with knives to fix things that aren't broken.  She also has bruises in various stages.  She is continually c/o RUQ pain and slight bruising noted.  Pt on eloquis.

## 2016-10-02 NOTE — ED Notes (Signed)
Pt becoming somewhat agitated. Continually asking why she is here and why she can't go home yet. Family and this RN explain to her at different points, but confusion continues.

## 2016-10-02 NOTE — ED Provider Notes (Signed)
[place] DEPT Provider Note   CSN: [id number] Arrival date & time: 10/02/16  1702     History   Chief Complaint Chief Complaint  Patient presents with  . Altered Mental Status  . Fall    HPI Destiny Hughes is a 71 y.o. female.  HPI  Patient presents with family members provide history of present illness. Patient is altered, has very poor memory, is delusional, level V caveat Family members note that the patient has had substantial change in interactivity for the past few months after the death of her sister. However, beyond those changes over the past 3 days patient has had new atypical behavior. Patient does not recall any of this, but apparently the patient has been hallucinating, and been walking around her house with knives, attempting to fix things. Patient denies any unusual behavioral, any confusion, disorientation. She does complain of right-sided chest wall pain. Family notes that she has had falls in the past, broke a rib several months ago. Patient denies dyspnea.   Past Medical History:  Diagnosis Date  . Chronic back pain   . Collapse of right lung   . COPD (chronic obstructive pulmonary disease) (HCC)    Hx. Bronchitis occ, 01-25-11 somes issue now taking  Z-pak-started 01-24-11/Scar tissue  present  from previous lung collapse  . Depression   . GERD (gastroesophageal reflux disease)    tx. TUMS  . Glaucoma   . Hyperlipemia   . Hypothyroidism    tx. Levothyroxine  . Nephrolithiasis   . Neuromuscular disorder ([place]) 01-25-11   Pain/nerve stimulator implanted -2 yrs ago.    Patient Active Problem List   Diagnosis Date Noted  . Atrial flutter ([place]) 08/15/2016  . Atrial fibrillation with RVR ([place]) 08/15/2016  . SVT (supraventricular tachycardia) ([place]) 07/11/2016  . Weight loss 07/08/2016  . AKI (acute kidney injury) ([place]) 07/02/2016  . Acute kidney injury ([place]) 07/01/2016  . Hyperglycemia 07/01/2016  . Hyponatremia 07/01/2016  . Intractable  nausea and vomiting 07/01/2016  . Abnormal EKG 07/01/2016  . Diastolic CHF ([place]) 65/99/3570  . Medication management 04/28/2015  . Emphysema lung ([place]) 12/26/2014  . GERD (gastroesophageal reflux disease) 12/26/2014  . Chronic bronchitis ([place]) 11/13/2014  . COPD (chronic obstructive pulmonary disease) ([place]) 10/03/2014  . At high risk for falls 10/03/2014  . Hypothyroidism 10/03/2014  . Mitral valve prolapse 10/03/2014  . Encounter for Medicare annual wellness exam 10/03/2014  . IBS (irritable bowel syndrome) 04/01/2014  . H/O total knee replacement 04/22/2013  . Essential hypertension 04/15/2013  . Vitamin D deficiency 04/15/2013  . Encounter for long-term (current) use of medications 04/15/2013  . Prediabetes 04/15/2013  . Osteoarthritis 01/31/2011  . Hyperlipemia   . Glaucoma   . Chronic pain disorder   . Chronic back pain     Past Surgical History:  Procedure Laterality Date  . A-FLUTTER ABLATION N/A 08/16/2016   Procedure: A-Flutter Ablation;  Surgeon: Evans Lance, MD;  Location: [place] CV LAB;  Service: Cardiovascular;  Laterality: N/A;  . APPENDECTOMY  01-25-11  . CARPAL TUNNEL RELEASE  01-25-11   Bil.  . CERVICAL SPINE SURGERY  01-25-11   2005-multiple levels  . CHOLECYSTECTOMY  01-25-11   '07  . COLONOSCOPY WITH PROPOFOL N/A 07/23/2012   Procedure: COLONOSCOPY WITH PROPOFOL;  Surgeon: Garlan Fair, MD;  [facility];  Service: Endoscopy;  Laterality: N/A;  . EYE SURGERY    . JOINT REPLACEMENT  01-25-11   6'03 LTHA-hemi  . KNEE ARTHROPLASTY  01-25-11   '04-right, revised x2  . SPINAL CORD STIMULATOR IMPLANT  2009 APPROX   DR. ELSNER  . THORACIC SPINE SURGERY  01-25-11   6'02- then nerve stimulator implanted after  . TOTAL KNEE REVISION  02/01/2011   Procedure: TOTAL KNEE REVISION;  Surgeon: Mauri Pole;  [facility];  Service: Orthopedics;  Laterality: Right;  . TUBAL LIGATION      OB History    No data available       Home  Medications    Prior to Admission medications   Medication Sig Start Date End Date Taking? Authorizing Provider  acetaminophen (TYLENOL) 325 MG tablet Take 2 tablets (650 mg total) by mouth every 6 (six) hours as needed for headache (pain). 07/14/16   Ghimire, Henreitta Leber, MD  apixaban (ELIQUIS) 5 MG TABS tablet Take 1 tablet (5 mg total) by mouth 2 (two) times daily. 08/09/16   Sherran Needs, NP  diclofenac sodium (VOLTAREN) 1 % GEL Apply 2 g topically 4 (four) times daily as needed. To right chest area for pain Patient taking differently: Apply 2 g topically 4 (four) times daily as needed. Only uses as needed for pain 07/14/16   Jonetta Osgood, MD  levalbuterol [facility] HFA) 45 MCG/ACT inhaler Inhale 2 puffs into the lungs every 4 (four) hours as needed for wheezing. 09/22/16   Alfonzo Beers, MD  levothyroxine (SYNTHROID) 100 MCG tablet Take 1 tablet (100 mcg total) by mouth daily before breakfast. 09/26/16   Evans Lance, MD  lisinopril (PRINIVIL,ZESTRIL) 5 MG tablet Take 1 tablet (5 mg total) by mouth daily. 08/31/16   Skeet Latch, MD  magnesium oxide (MAG-OX) 400 MG tablet Take 1 tablet (400 mg total) by mouth daily. Patient taking differently: Take 400 mg by mouth daily as needed (nausea or reflux).  09/07/16   Erlene Quan, PA-C  Multiple Vitamin (MULTIVITAMIN WITH MINERALS) TABS tablet Take 1 tablet by mouth daily. [employer] , Historical, MD  pantoprazole (PROTONIX) 40 MG tablet Take 1 tablet (40 mg total) by mouth 2 (two) times daily. 07/14/16   Ghimire, Henreitta Leber, MD  promethazine (PHENERGAN) 25 MG tablet Take 25 mg by mouth every 6 (six) hours as needed for nausea or vomiting.    [provider]  sertraline (ZOLOFT) 100 MG tablet Take 100 mg by mouth at bedtime.    [provider]    Family History Family History  Problem Relation Age of Onset  . Heart attack Father   . Emphysema Father   . Aneurysm Mother        CEREBRAL  .  Emphysema Mother   . Dementia Mother   . Diabetes Son   . Hypertension Son   . Hypertension Son   . Cancer Sister        Widely Metastatic  . Asthma Son        x2  . Breast cancer Paternal Aunt   . Rheum arthritis Maternal Aunt     Social History Social History  Substance Use Topics  . Smoking status: Current Every Day Smoker    Packs/day: 0.75    Years: 50.00    Types: Cigarettes    Start date: 09/13/1961  . Smokeless tobacco: Never Used     Comment: Decreased Down to 2/3 a Day if Thinking [place] It  . Alcohol use No     Comment: none in 10 years     Allergies   Albuterol; Penicillins;  Ventolin [kdc:albuterol]; Sulfa antibiotics; Zanaflex [tizanidine]; Codeine; and Ecotrin [aspirin]   Review of Systems Review of Systems  Unable to perform ROS: Mental status change     Physical Exam Updated Vital Signs BP (!) 156/70   Pulse 71   Temp 98 F (36.7 C) (Oral)   Resp 18   Ht 5\' 6"  (1.676 m)   Wt 65.8 kg (145 lb)   SpO2 98%   BMI 23.40 kg/m   Physical Exam  Constitutional: She appears well-developed and well-nourished. No distress.  HENT:  Head: Normocephalic and atraumatic.  Eyes: Conjunctivae and EOM are normal.  Cardiovascular: Normal rate and regular rhythm.   Pulmonary/Chest: Effort normal and breath sounds normal. No stridor. No respiratory distress.  Abdominal: She exhibits no distension.  Musculoskeletal: She exhibits no edema.  Neurological: She is alert. She displays atrophy. She displays no tremor. No cranial nerve deficit. She exhibits normal muscle tone. She displays no seizure activity.  Skin: Skin is warm and dry.  Psychiatric: Her mood appears anxious. Her affect is labile. Her speech is delayed and tangential. Thought content is delusional. Cognition and memory are impaired.  Patient has very poor recall, and forgets statements that were offered only moments earlier in the evaluation  Nursing note and vitals reviewed.    ED Treatments /  Results  Labs (all labs ordered are listed, but only abnormal results are displayed) Labs Reviewed  COMPREHENSIVE METABOLIC PANEL - Abnormal; Notable for the following:       Result Value   Sodium 118 (*)    Chloride 85 (*)    Glucose, Bld 134 (*)    All other components within normal limits  CBC - Abnormal; Notable for the following:    WBC 11.0 (*)    MCHC 36.3 (*)    Platelets 492 (*)    All other components within normal limits  URINALYSIS, ROUTINE W REFLEX MICROSCOPIC - Abnormal; Notable for the following:    Color, Urine STRAW (*)    Specific Gravity, Urine 1.004 (*)    All other components within normal limits     Procedures Procedures (including critical care time)  Medications Ordered in ED Medications  sodium chloride 0.9 % bolus 1,000 mL (1,000 mLs Intravenous New Bag/Given 10/02/16 1849)    Chart review notable for emergency department evaluation 2 weeks ago, behavioral health evaluation yesterday, with recommendation as below.  Recommendations:   Based on my evaluation the patient does not appear to have an emergency medical condition.  Patient and family prefers to follow up with OP facilities, OP resources were provided.     Vicenta Aly, NP 10/01/2016, 2:10 PM  Patient had CT scan performed within the last month, unremarkable. Initial labs notable for sodium of 118, though the patient has chronic hyponatremia, this is critically abnormal, lower than her prior values. This is likely contributing to her new delirium. Patient received fluid resuscitation, will require admission for further evaluation and management.  9:23 PM Patient agitated, but redirectable.  Initial Impression / Assessment and Plan / ED Course  I have reviewed the triage vital signs and the nursing notes.  Pertinent labs & imaging results that were available during my care of the patient were reviewed by me and considered in my medical decision making (see chart for  details).    Final Clinical Impressions(s) / ED Diagnoses  Delirium Hyponatremia  CRITICAL CARE Performed by: Carmin Muskrat Total critical care time: 35 minutes Critical care time was exclusive of separately  billable procedures and treating other patients. Critical care was necessary to treat or prevent imminent or life-threatening deterioration. Critical care was time spent personally by me on the following activities: development of treatment plan with patient and/or surrogate as well as nursing, discussions with consultants, evaluation of patient's response to treatment, examination of patient, obtaining history from patient or surrogate, ordering and performing treatments and interventions, ordering and review of laboratory studies, ordering and review of radiographic studies, pulse oximetry and re-evaluation of patient's condition.    Carmin Muskrat, MD 10/02/16 2123

## 2016-10-02 NOTE — ED Notes (Signed)
Admitting Provider in room speaking with family.

## 2016-10-02 NOTE — ED Notes (Signed)
Date and time results received: 10/02/16 6:30 PM  Test: Sodium Critical Value: 118  Name of Provider Notified: Vanita Panda MD  Orders Received

## 2016-10-02 NOTE — H&P (Signed)
History and Physical    Destiny Hughes JIR:[id number] DOB: 10-28-1945 DOA: 10/02/2016  PCP: Minette Brine  Patient coming from: Home  I have personally briefly reviewed patient's old medical records in [place]  Chief Complaint: Falls, AMS  HPI: Destiny Hughes is a 71 y.o. female with medical history significant of COPD, hypothyroidism, depression following death of sister back in 06/27/2022, hyponatremia chronically, admitted in April this year for sodium 121 which was believed to be due to poor PO intake and corrected with NS during admission.  CT chest on 4/22 was negative for any obvious malignancy.  UA at that time demonstrated urine sodium <10 and urine osm in the 400s.  Patient brought in by family for confusion, weakness, falls, and forgetfullness.  While she has ongoing chronic depression these symptoms have been worse especially over the past 3 days they state.  Symptoms are persistent, severe, worsening.  ED Course: Sodium in ED today is 118.  For comparison sodium on 7/5 was 123 and sodium on 6/18 was 139.  Prior to that as mentioned above her sodium has tended to run on the low side for quite some time.  No weight loss over recent month (had 20 lbs weight loss back in Jun 27, 2022 of this year).  Review of Systems: As per HPI otherwise 10 point review of systems negative.   Past Medical History:  Diagnosis Date  . Chronic back pain   . Collapse of right lung   . COPD (chronic obstructive pulmonary disease) (HCC)    Hx. Bronchitis occ, 01-25-11 somes issue now taking  Z-pak-started 01-24-11/Scar tissue  present  from previous lung collapse  . Depression   . GERD (gastroesophageal reflux disease)    tx. TUMS  . Glaucoma   . Hyperlipemia   . Hypothyroidism    tx. Levothyroxine  . Nephrolithiasis   . Neuromuscular disorder ([place]) 01-25-11   Pain/nerve stimulator implanted -2 yrs ago.    Past Surgical History:  Procedure Laterality Date  . A-FLUTTER ABLATION N/A  08/16/2016   Procedure: A-Flutter Ablation;  Surgeon: Evans Lance, MD;  Location: [place] [facility];  Service: Cardiovascular;  Laterality: N/A;  . APPENDECTOMY  01-25-11  . CARPAL TUNNEL RELEASE  01-25-11   Bil.  . CERVICAL SPINE SURGERY  01-25-11   2005-multiple levels  . CHOLECYSTECTOMY  01-25-11   '07  . COLONOSCOPY WITH PROPOFOL N/A 07/23/2012   Procedure: COLONOSCOPY WITH PROPOFOL;  Surgeon: Garlan Fair, MD;  [facility];  Service: Endoscopy;  Laterality: N/A;  . EYE SURGERY    . JOINT REPLACEMENT  01-25-11   6'03 LTHA-hemi  . KNEE ARTHROPLASTY  01-25-11   '04-right, revised x2  . SPINAL CORD STIMULATOR IMPLANT  06-27-2007 APPROX   DR. ELSNER  . THORACIC SPINE SURGERY  01-25-11   6'02- then nerve stimulator implanted after  . TOTAL KNEE REVISION  02/01/2011   Procedure: TOTAL KNEE REVISION;  Surgeon: Mauri Pole;  Location: [facility];  Service: Orthopedics;  Laterality: Right;  . TUBAL LIGATION       reports that she has been smoking Cigarettes.  She started smoking about 55 years ago. She has a 37.50 pack-year smoking history. She has never used smokeless tobacco. She reports that she does not drink alcohol or use drugs.  Allergies  Allergen Reactions  . Albuterol Other (See Comments)    Had difficulty breathing after a nebulizer treatment  . Penicillins Anaphylaxis and Swelling    Has patient  had a PCN reaction causing immediate rash, facial/tongue/throat swelling, SOB or lightheadedness with hypotension: Yes Has patient had a PCN reaction causing severe rash involving mucus membranes or skin necrosis: Rash Has patient had a PCN reaction that required hospitalization: No Has patient had a PCN reaction occurring within the last 10 years: No If all of the above answers are "NO", then may proceed with Cephalosporin use.  Destiny Hughes [Kdc:Albuterol] Shortness Of Breath  . Sulfa Antibiotics Swelling    Throat swells, but no shortness of breath noted  . Zanaflex  [Tizanidine] Other (See Comments)    Possible confusion (??)  . Codeine Nausea And Vomiting  . Ecotrin [Aspirin] Nausea And Vomiting    Family History  Problem Relation Age of Onset  . Heart attack Father   . Emphysema Father   . Aneurysm Mother        CEREBRAL  . Emphysema Mother   . Dementia Mother   . Diabetes Son   . Hypertension Son   . Hypertension Son   . Cancer Sister        Widely Metastatic  . Asthma Son        x2  . Breast cancer Paternal Aunt   . Rheum arthritis Maternal Aunt      Prior to Admission medications   Medication Sig Start Date End Date Taking? Authorizing Provider  acetaminophen (TYLENOL) 325 MG tablet Take 2 tablets (650 mg total) by mouth every 6 (six) hours as needed for headache (pain). 07/14/16   Ghimire, Henreitta Leber, MD  apixaban (ELIQUIS) 5 MG TABS tablet Take 1 tablet (5 mg total) by mouth 2 (two) times daily. 08/09/16   Sherran Needs, NP  diclofenac sodium (VOLTAREN) 1 % GEL Apply 2 g topically 4 (four) times daily as needed. To right chest area for pain Patient taking differently: Apply 2 g topically 4 (four) times daily as needed. Only uses as needed for pain 07/14/16   Jonetta Osgood, MD  levalbuterol [facility] HFA) 45 MCG/ACT inhaler Inhale 2 puffs into the lungs every 4 (four) hours as needed for wheezing. 09/22/16   Alfonzo Beers, MD  levothyroxine (SYNTHROID) 100 MCG tablet Take 1 tablet (100 mcg total) by mouth daily before breakfast. 09/26/16   Evans Lance, MD  lisinopril (PRINIVIL,ZESTRIL) 5 MG tablet Take 1 tablet (5 mg total) by mouth daily. 08/31/16   Skeet Latch, MD  magnesium oxide (MAG-OX) 400 MG tablet Take 1 tablet (400 mg total) by mouth daily. Patient taking differently: Take 400 mg by mouth daily as needed (nausea or reflux).  09/07/16   Erlene Quan, PA-C  Multiple Vitamin (MULTIVITAMIN WITH MINERALS) TABS tablet Take 1 tablet by mouth daily. [facility] [employer] , Historical, MD  pantoprazole (PROTONIX)  40 MG tablet Take 1 tablet (40 mg total) by mouth 2 (two) times daily. 07/14/16   Ghimire, Henreitta Leber, MD  promethazine (PHENERGAN) 25 MG tablet Take 25 mg by mouth every 6 (six) hours as needed for nausea or vomiting.    [provider]  sertraline (ZOLOFT) 100 MG tablet Take 100 mg by mouth at bedtime.    [provider]    Physical Exam: Vitals:   10/02/16 1800 10/02/16 1830 10/02/16 1845 10/02/16 1930  BP: (!) 156/70 138/76 (!) 106/93 (!) 165/89  Pulse: 71 70 74 74  Resp:      Temp:      TempSrc:      SpO2: 98% 99% 98% 99%  Weight:      Height:        Constitutional: NAD, calm, comfortable Eyes: PERRL, lids and conjunctivae normal ENMT: Mucous membranes are moist. Posterior pharynx clear of any exudate or lesions.Normal dentition.  Neck: normal, supple, no masses, no thyromegaly Respiratory: clear to auscultation bilaterally, no wheezing, no crackles. Normal respiratory effort. No accessory muscle use.  Cardiovascular: Regular rate and rhythm, no murmurs / rubs / gallops. No extremity edema. 2+ pedal pulses. No carotid bruits.  Abdomen: no tenderness, no masses palpated. No hepatosplenomegaly. Bowel sounds positive.  Musculoskeletal: no clubbing / cyanosis. No joint deformity upper and lower extremities. Good ROM, no contractures. Normal muscle tone.  Skin: no rashes, lesions, ulcers. No induration Neurologic: CN 2-12 grossly intact. Sensation intact, DTR normal. Strength 5/5 in all 4.  Psychiatric: Significant forgetfulness.   Labs on Admission: I have personally reviewed following labs and imaging studies  CBC:  Recent Labs Lab 10/02/16 1719  WBC 11.0*  HGB 13.6  HCT 37.5  MCV 85.2  PLT 381*   Basic Metabolic Panel:  Recent Labs Lab 10/02/16 1719  NA 118*  K 3.5  CL 85*  CO2 25  GLUCOSE 134*  BUN 9  CREATININE 0.88  CALCIUM 9.1   GFR: Estimated Creatinine Clearance: 53.3 mL/min (by C-G formula based on SCr of 0.88 mg/dL). Liver  Function Tests:  Recent Labs Lab 10/02/16 1719  AST 21  ALT 16  ALKPHOS 94  BILITOT 0.5  PROT 6.6  ALBUMIN 3.9   No results for input(s): LIPASE, AMYLASE in the last 168 hours. No results for input(s): AMMONIA in the last 168 hours. Coagulation Profile: No results for input(s): INR, PROTIME in the last 168 hours. Cardiac Enzymes: No results for input(s): CKTOTAL, CKMB, CKMBINDEX, TROPONINI in the last 168 hours. BNP (last 3 results) No results for input(s): PROBNP in the last 8760 hours. HbA1C: No results for input(s): HGBA1C in the last 72 hours. CBG: No results for input(s): GLUCAP in the last 168 hours. Lipid Profile: No results for input(s): CHOL, HDL, LDLCALC, TRIG, CHOLHDL, LDLDIRECT in the last 72 hours. Thyroid Function Tests: No results for input(s): TSH, T4TOTAL, FREET4, T3FREE, THYROIDAB in the last 72 hours. Anemia Panel: No results for input(s): VITAMINB12, FOLATE, FERRITIN, TIBC, IRON, RETICCTPCT in the last 72 hours. Urine analysis:    Component Value Date/Time   COLORURINE STRAW (A) 10/02/2016 1818   APPEARANCEUR CLEAR 10/02/2016 1818   LABSPEC 1.004 (L) 10/02/2016 1818   PHURINE 7.0 10/02/2016 1818   GLUCOSEU NEGATIVE 10/02/2016 1818   HGBUR NEGATIVE 10/02/2016 1818   BILIRUBINUR NEGATIVE 10/02/2016 1818   KETONESUR NEGATIVE 10/02/2016 1818   PROTEINUR NEGATIVE 10/02/2016 1818   UROBILINOGEN 0.2 02/17/2014 1026   NITRITE NEGATIVE 10/02/2016 1818   LEUKOCYTESUR NEGATIVE 10/02/2016 1818    Radiological Exams on Admission: Dg Chest 2 View  Result Date: 10/02/2016 CLINICAL DATA:  Hyponatremia. Dizziness and confusion. Shortness of breath. EXAM: CHEST  2 VIEW COMPARISON:  Two-view chest x-ray 09/22/2016. FINDINGS: The heart size is normal. The lungs are clear. Chronic scarring at the right lung base is stable. There is no edema or effusion. No focal airspace disease is present. Upper thoracic kyphosis is stable. IMPRESSION: 1. No acute cardiopulmonary  disease or significant interval change. Electronically Signed   By: San Morelle M.D.   On: 10/02/2016 20:41    EKG: Independently reviewed.  Assessment/Plan Principal Problem:   Hyponatremia Active Problems:   Essential hypertension   Emphysema lung ([place])  Atrial flutter ([place])    1. Hyponatremia - 1. Urine sodium is only 19, no where near the 40+ typically seen with SIADH.  Urine osm is 172.  SIADH therefore felt to be less likely. 2. IVF: 1L NS bolus in ED given, will put her on 100 cc/hr NS similar to last admission in April. 3. Repeat BMP at MN and 0600 to trend Sodium 2. HTN - Continue lisinopril 3. A.Flutter - continue eliquis 4. Emphysema - continue home nebs  DVT prophylaxis: Eliquis Code Status: Full Family Communication: Family at bedside Disposition Plan: Home after admit Consults called: None Admission status: Admit to inpatient   Etta Quill DO [facility] Pager [phone]  If 7AM-7PM, please contact day team taking care of patient [email or web address] Password [facility]  10/02/2016, 8:55 PM

## 2016-10-02 NOTE — ED Notes (Signed)
7mg  nicotine patch removed.

## 2016-10-02 NOTE — Progress Notes (Signed)
  Pt admitted to the unit. Pt is stable, alert and oriented per baseline. Oriented to room, staff, and call bell. Educated to call for any assistance. Bed in lowest position, call bell within reach- will continue to monitor. 

## 2016-10-03 ENCOUNTER — Inpatient Hospital Stay (HOSPITAL_COMMUNITY): Payer: Medicare Other

## 2016-10-03 LAB — AMMONIA: Ammonia: 11 umol/L (ref 9–35)

## 2016-10-03 LAB — BASIC METABOLIC PANEL
Anion gap: 5 (ref 5–15)
Anion gap: 8 (ref 5–15)
BUN: 8 mg/dL (ref 6–20)
BUN: 9 mg/dL (ref 6–20)
CALCIUM: 8.4 mg/dL — AB (ref 8.9–10.3)
CALCIUM: 8.7 mg/dL — AB (ref 8.9–10.3)
CO2: 23 mmol/L (ref 22–32)
CO2: 26 mmol/L (ref 22–32)
CREATININE: 0.75 mg/dL (ref 0.44–1.00)
CREATININE: 0.79 mg/dL (ref 0.44–1.00)
Chloride: 92 mmol/L — ABNORMAL LOW (ref 101–111)
Chloride: 93 mmol/L — ABNORMAL LOW (ref 101–111)
GFR calc Af Amer: >60 mL/min (ref >60)
GFR calc Af Amer: >60 mL/min (ref >60)
GLUCOSE: 119 mg/dL — AB (ref 65–99)
GLUCOSE: 161 mg/dL — AB (ref 65–99)
POTASSIUM: 3.6 mmol/L (ref 3.5–5.1)
POTASSIUM: 3.7 mmol/L (ref 3.5–5.1)
SODIUM: 123 mmol/L — AB (ref 135–145)
SODIUM: 124 mmol/L — AB (ref 135–145)

## 2016-10-03 LAB — TSH: TSH: 0.496 u[IU]/mL (ref 0.350–4.500)

## 2016-10-03 MED ORDER — IOPAMIDOL (ISOVUE-300) INJECTION 61%
INTRAVENOUS | Status: AC
  Administered 2016-10-04: 75 mL via INTRAVENOUS
  Filled 2016-10-03: qty 75

## 2016-10-03 MED ORDER — LORAZEPAM 0.5 MG PO TABS
0.5000 mg | ORAL_TABLET | Freq: Four times a day (QID) | ORAL | Status: DC | PRN
  Administered 2016-10-03 – 2016-10-04 (×2): 0.5 mg via ORAL
  Filled 2016-10-03 (×2): qty 1

## 2016-10-03 NOTE — Progress Notes (Signed)
[facility]                                                                              Patient Demographics  Destiny Hughes, is a 71 y.o. female, DOB - Aug 31, 1945, ONG:[id number]  Admit date - 10/02/2016   Admitting Physician Etta Quill, DO  Outpatient Primary MD for the patient is Minette Brine  Outpatient specialists:   LOS - 1  days    Chief Complaint  Patient presents with  . Altered Mental Status  . Fall       Brief summary  Destiny Hughes is a 71 y.o. female with medical history significant for but not limited to COPD, hypothyroidism, and chronic hyponatremia presenting with a three-week history of progressively worsening confusion with weakness forget fullness and falls associated with hyponatremia of 118,  from 139 on 09/05/2016.  Of note, she lost her sister in February 2018 and had been grieving with poor oral intake culminating with insidious onset of confusion, felt to be due to combination of depression and associated dehydration due to poor intake,  and hyponatremia with narcotic withdrawal.  Assessment & Plan    Principal Problem:   Hyponatremia Active Problems:   Essential hypertension   Emphysema lung (HCC)   Atrial flutter (HCC)   #1 Altered Mental Status: Etiology possibly toxic metabolic with ?? underlying dementia Supportive care for now Consider neurologic eval May benefit from psychiatric eval  #2 Hyponatremia: Etiology unclear Possibly due to decreased oral intake/dehydration Improving with saline infusion Monitor renal function  Code Status: Full code DVT Prophylaxis: On Eliquis Family Communication: Discussed with husband  Disposition Plan: To be determined  Time Spent in minutes 31 minutes  Procedures:    Consultants:   Neurology  Antimicrobials:      Medications  Scheduled Meds: . apixaban  5 mg Oral BID  . feeding supplement (ENSURE ENLIVE)  237 mL Oral BID BM  . levothyroxine  100  mcg Oral QAC breakfast  . lisinopril  5 mg Oral Daily  . nicotine  21 mg Transdermal Daily  . pantoprazole  40 mg Oral BID  . sertraline  100 mg Oral QHS   Continuous Infusions: . sodium chloride 100 mL/hr at 10/02/16 2111   PRN Meds:.acetaminophen, levalbuterol, magnesium oxide, ondansetron **OR** ondansetron (ZOFRAN) IV, zolpidem   Antibiotics   Anti-infectives    None        Subjective:   [name] was seen and examined today. Admitting H&P reviewed and noted no acute events reported overnight. Patient did not sleep overnight  Objective:   Vitals:   10/02/16 2110 10/02/16 2246 10/03/16 0514 10/03/16 1002  BP: (!) 161/88 (!) 146/86 (!) 146/75 (!) 158/75  Pulse: 84 86 86 98  Resp: 18 18 19 16   Temp:  97.8 F (36.6 C) 98 F (36.7 C) 98.4 F (36.9 C)  TempSrc:  Oral Oral Oral  SpO2: 97% 98% 99% 99%  Weight:  65.8 kg (145 lb)    Height:  5\' 6"  (1.676 m)      Intake/Output Summary (Last 24 hours) at 10/03/16 1254 Last data filed at 10/02/16 2300  Gross per 24 hour  Intake             2240 ml  Output                0 ml  Net             2240 ml     Wt Readings from Last 3 Encounters:  10/02/16 65.8 kg (145 lb)  09/26/16 60.8 kg (134 lb)  09/22/16 59.9 kg (132 lb)     Exam  General: NAD  HEENT: NCAT,  PERRL,MMM  Neck: SUPPLE, (-) JVD  Cardiovascular: RRR, (-) GALLOP, (-) MURMUR  Respiratory: CTA  Gastrointestinal: SOFT, (-) DISTENSION, BS(+), (_) TENDERNESS  Ext: (-) CYANOSIS, (-) EDEMA  Neuro: A, OX 2   Psych:NORMAL AFFECT/MOOD   Data Reviewed:  I have personally reviewed following labs and imaging studies  Micro Results No results found for this or any previous visit (from the past 240 hour(s)).  Radiology Reports Dg Chest 2 View  Result Date: 10/02/2016 CLINICAL DATA:  Hyponatremia. Dizziness and confusion. Shortness of breath. EXAM: CHEST  2 VIEW COMPARISON:  Two-view chest x-ray 09/22/2016. FINDINGS: The heart size is  normal. The lungs are clear. Chronic scarring at the right lung base is stable. There is no edema or effusion. No focal airspace disease is present. Upper thoracic kyphosis is stable. IMPRESSION: 1. No acute cardiopulmonary disease or significant interval change. Electronically Signed   By: San Morelle M.D.   On: 10/02/2016 20:41   Dg Chest 2 View  Result Date: 09/22/2016 CLINICAL DATA:  Tachycardia for 1 week. Chest pain of unknown duration. EXAM: CHEST  2 VIEW COMPARISON:  Single-view of the chest 08/15/2016. PA and lateral chest 08/03/2016. FINDINGS: The lungs are clear. Heart size is normal. No pneumothorax or pleural effusion. Mild pleural scarring along the periphery of the right lung base is unchanged. No acute bony abnormality. The patient is status post cervical fusion. Spinal stimulator is in place. IMPRESSION: No acute disease. Electronically Signed   By: Inge Rise M.D.   On: 09/22/2016 10:39    Lab Data:  CBC:  Recent Labs Lab 10/02/16 1719  WBC 11.0*  HGB 13.6  HCT 37.5  MCV 85.2  PLT 081*   Basic Metabolic Panel:  Recent Labs Lab 10/02/16 1719 10/03/16 0003 10/03/16 0602  NA 118* 123* 124*  K 3.5 3.7 3.6  CL 85* 92* 93*  CO2 25 26 23   GLUCOSE 134* 161* 119*  BUN 9 9 8   CREATININE 0.88 0.79 0.75  CALCIUM 9.1 8.4* 8.7*   GFR: Estimated Creatinine Clearance: 58.6 mL/min (by C-G formula based on SCr of 0.75 mg/dL). Liver Function Tests:  Recent Labs Lab 10/02/16 1719  AST 21  ALT 16  ALKPHOS 94  BILITOT 0.5  PROT 6.6  ALBUMIN 3.9   No results for input(s): LIPASE, AMYLASE in the last 168 hours. No results for input(s): AMMONIA in the last 168 hours. Coagulation Profile: No results for input(s): INR, PROTIME in the last 168 hours. Cardiac Enzymes: No results for input(s): CKTOTAL, CKMB, CKMBINDEX, TROPONINI in the last 168 hours. BNP (last 3 results) No results for input(s): PROBNP in the last 8760 hours. HbA1C: No results for  input(s): HGBA1C in the last 72 hours. CBG: No results for input(s): GLUCAP in the last 168 hours. Lipid Profile: No results for input(s): CHOL, HDL, LDLCALC, TRIG, CHOLHDL, LDLDIRECT in the last 72 hours. Thyroid Function Tests: No results for input(s): TSH, T4TOTAL, FREET4,  T3FREE, THYROIDAB in the last 72 hours. Anemia Panel: No results for input(s): VITAMINB12, FOLATE, FERRITIN, TIBC, IRON, RETICCTPCT in the last 72 hours. Urine analysis:    Component Value Date/Time   COLORURINE STRAW (A) 10/02/2016 1818   APPEARANCEUR CLEAR 10/02/2016 1818   LABSPEC 1.004 (L) 10/02/2016 1818   PHURINE 7.0 10/02/2016 1818   GLUCOSEU NEGATIVE 10/02/2016 1818   HGBUR NEGATIVE 10/02/2016 1818   BILIRUBINUR NEGATIVE 10/02/2016 1818   KETONESUR NEGATIVE 10/02/2016 1818   PROTEINUR NEGATIVE 10/02/2016 1818   UROBILINOGEN 0.2 02/17/2014 1026   NITRITE NEGATIVE 10/02/2016 1818   LEUKOCYTESUR NEGATIVE 10/02/2016 1818     OSEI-BONSU,Deiondra Denley M.D. [facility] 10/03/2016, 12:54 PM  Pager: [phone] Between 7am to 7pm - call Pager - [phone]  After 7pm go to [email or web address] - password [email or web address]  Call night coverage person covering after 7pm

## 2016-10-03 NOTE — Patient Outreach (Signed)
[facility]) Care Management  09/29/2016  Livian Vanderbeck Arutyunyan 06-26-1945 [id number]   Home visit for assessment of community care coordination needs. Same day appointment made due to patient's husband's complaint of change in gait andmental status. Spoke with patient's PCP, Ms. Laurance Flatten, NP who provided patient with same day appointment.

## 2016-10-03 NOTE — Evaluation (Signed)
Clinical/Bedside Swallow Evaluation Patient Details  Name: Destiny Hughes MRN: [id number] Date of Birth: 1945-09-17  Today's Date: 10/03/2016 Time: SLP Start Time (ACUTE ONLY): 0825 SLP Stop Time (ACUTE ONLY): 0837 SLP Time Calculation (min) (ACUTE ONLY): 12 min  Past Medical History:  Past Medical History:  Diagnosis Date  . Chronic back pain   . Collapse of right lung   . COPD (chronic obstructive pulmonary disease) (HCC)    Hx. Bronchitis occ, 01-25-11 somes issue now taking  Z-pak-started 01-24-11/Scar tissue  present  from previous lung collapse  . Depression   . GERD (gastroesophageal reflux disease)    tx. TUMS  . Glaucoma   . Hyperlipemia   . Hypothyroidism    tx. Levothyroxine  . Nephrolithiasis   . Neuromuscular disorder ([place]) 01-25-11   Pain/nerve stimulator implanted -2 yrs ago.   Past Surgical History:  Past Surgical History:  Procedure Laterality Date  . A-FLUTTER ABLATION N/A 08/16/2016   Procedure: A-Flutter Ablation;  Surgeon: Evans Lance, MD;  Location: [place] [facility];  Service: Cardiovascular;  Laterality: N/A;  . APPENDECTOMY  01-25-11  . CARPAL TUNNEL RELEASE  01-25-11   Bil.  . CERVICAL SPINE SURGERY  01-25-11   2005-multiple levels  . CHOLECYSTECTOMY  01-25-11   '07  . COLONOSCOPY WITH PROPOFOL N/A 07/23/2012   Procedure: COLONOSCOPY WITH PROPOFOL;  Surgeon: Garlan Fair, MD;  [facility];  Service: Endoscopy;  Laterality: N/A;  . EYE SURGERY    . JOINT REPLACEMENT  01-25-11   6'03 LTHA-hemi  . KNEE ARTHROPLASTY  01-25-11   '04-right, revised x2  . SPINAL CORD STIMULATOR IMPLANT  07-07-07 APPROX   DR. ELSNER  . THORACIC SPINE SURGERY  01-25-11   6'02- then nerve stimulator implanted after  . TOTAL KNEE REVISION  02/01/2011   Procedure: TOTAL KNEE REVISION;  Surgeon: Mauri Pole;  Location: [facility];  Service: Orthopedics;  Laterality: Right;  . TUBAL LIGATION     HPI:  Destiny Wiehe Trollingeris a 71 y.o.femalewith medical history  significant of COPD, GERD, neuromuscular disorder, hypothyroidism, depression following death of sister back in 07-Jul-2022, hyponatremia chronically, admitted in April this year for sodium 121 which was believed to be due to poor PO intake. This admission pt brought in by family for confusion, weakness, falls, and forgetfullness. Found to have hyponatremia. CXR No acute cardiopulmonary disease or significant interval change.   Assessment / Plan / Recommendation Clinical Impression  Pt with poor ability to retain information, repeating questions every 5 seconds. Vocal quality mildy wet following thin, consistently. No cough, throat clear or reports of globus sensation. Aspiration risk factors include history of COPD and GERD. Not convinced she needs objective assessment at present. Will follow tomorrow to determine safety with regular texture and thin liquids, pills with thin , straws allowed, sit upright. SLP Visit Diagnosis: Dysphagia, unspecified (R13.10)    Aspiration Risk   (mild-mod)    Diet Recommendation Regular;Thin liquid   Liquid Administration via: Cup;Straw Medication Administration: Whole meds with liquid Supervision: Patient able to self feed Compensations: Slow rate;Small sips/bites;Minimize environmental distractions Postural Changes: Seated upright at 90 degrees    Other  Recommendations Oral Care Recommendations: Oral care BID   Follow up Recommendations None      Frequency and Duration min 2x/week  2 weeks       Prognosis Prognosis for Safe Diet Advancement: Good Barriers to Reach Goals: Cognitive deficits      Swallow Study   General HPI:  Destiny Pennywell Trollingeris a 71 y.o.femalewith medical history significant of COPD, GERD, neuromuscular disorder, hypothyroidism, depression following death of sister back in , hyponatremia chronically, admitted in April this year for sodium 121 which was believed to be due to poor PO intake. This admission pt brought in by family  for confusion, weakness, falls, and forgetfullness. Found to have hyponatremia. CXR No acute cardiopulmonary disease or significant interval change. Type of Study: Bedside Swallow Evaluation Previous Swallow Assessment:  (none) Diet Prior to this Study: NPO Temperature Spikes Noted: No Respiratory Status: Room air History of Recent Intubation: No Behavior/Cognition: Alert;Cooperative;Requires cueing;Confused Oral Cavity Assessment: Within Functional Limits Oral Care Completed by SLP: No Oral Cavity - Dentition: Adequate natural dentition Vision: Functional for self-feeding Self-Feeding Abilities: Able to feed self Patient Positioning: Upright in bed Baseline Vocal Quality: Normal Volitional Cough: Strong Volitional Swallow: Able to elicit    Oral/Motor/Sensory Function Overall Oral Motor/Sensory Function: Within functional limits   Ice Chips Ice chips: Not tested   Thin Liquid Thin Liquid: Impaired Presentation: Cup;Straw Pharyngeal  Phase Impairments: Wet Vocal Quality    Nectar Thick Nectar Thick Liquid: Not tested   Honey Thick Honey Thick Liquid: Not tested   Puree Puree: Within functional limits   Solid   GO   Solid: Within functional limits        Houston Siren 10/03/2016,8:47 AM  Orbie Pyo Colvin Caroli.Ed [employer] [phone]

## 2016-10-03 NOTE — Consult Note (Signed)
NEURO HOSPITALIST CONSULT NOTE   Requestig physician: Dr. Vista Lawman   Reason for Consult: Possible ementia   History obtained from:  Currently patient is very anxious and unable to give a good history. I attempted to call the son who apparently is the better of historians however I was unable to get in contact with him via phone. And note he was in the room.  HPI:                                                                                                                                          Destiny Hughes is an 71 y.o. female with multiple medical issues. Back in April of this year patient was seen by Dr. Leta Baptist as an outpatient visit for memory decline. Apparently per his note in Jun 02, 2016 patient's sister passed away, and the patient has significant grieving and depression along with anxiety. Apparently over May the symptoms progressively worsened and patient was noted to have confusion, lethargy and weight loss. She did see her PCP who then referred her to Dr. Leta Baptist for further evaluation. Patient's Mini-Mental status testing score was 20 out of 30 at that time.  At that time differential included delirium due to (depression, decreased PO intake, dehydration, hyponatremia, narcotic withdrawal).   Family of brought patient back to the hospital on 10/02/2016 (3 months later) due to apparently increased confusion over the past 3 weeks, weakness, falls and forgetfulness. Per nurse who had spoken to the son (who is not in the room at this time and I could not get a hold of), stated that over the last 3 weeks her symptoms have worsened. While in the [facility] department patient's sodium was 118.   Upon consultation patient was resting comfortably in bed. I gently woke the patient up. Upon waking patient patient was very anxious, stating that she felt that she did not feel right, she felt as though her heart rate was very fast. Radial palpation of her heart rate  was in the 70s and patient was not diaphoretic, flush, nausea or vomiting. Patient was easily distracted by this feeling of uneasiness and was not able to fully participate in any form of history. She was able to tell me that she knew she was in the hospital, she knew his Zacarias Pontes, she did nose 2018 and the month was July. Any further questioning or further details patient would continually perseverate on the fact that she did not feel right. For this reason any formal form of Mini-Mental testing or questioning about her past was not capable.  Of note patient's TSH 11 days ago was 0.095 and T4 was 1.59  As stated I attempted to call her son but nobody answered.  Past Medical History:  Diagnosis Date  . Chronic  back pain   . Collapse of right lung   . COPD (chronic obstructive pulmonary disease) (HCC)    Hx. Bronchitis occ, 01-25-11 somes issue now taking  Z-pak-started 01-24-11/Scar tissue  present  from previous lung collapse  . Depression   . GERD (gastroesophageal reflux disease)    tx. TUMS  . Glaucoma   . Hyperlipemia   . Hypothyroidism    tx. Levothyroxine  . Nephrolithiasis   . Neuromuscular disorder ([place]) 01-25-11   Pain/nerve stimulator implanted -2 yrs ago.    Past Surgical History:  Procedure Laterality Date  . A-FLUTTER ABLATION N/A 08/16/2016   Procedure: A-Flutter Ablation;  Surgeon: Evans Lance, MD;  Location: [facility];  Service: Cardiovascular;  Laterality: N/A;  . APPENDECTOMY  01-25-11  . CARPAL TUNNEL RELEASE  01-25-11   Bil.  . CERVICAL SPINE SURGERY  01-25-11   2005-multiple levels  . CHOLECYSTECTOMY  01-25-11   '07  . COLONOSCOPY WITH PROPOFOL N/A 07/23/2012   Procedure: COLONOSCOPY WITH PROPOFOL;  Surgeon: Garlan Fair, MD;  Location: [facility];  Service: Endoscopy;  Laterality: N/A;  . EYE SURGERY    . JOINT REPLACEMENT  01-25-11   6'03 LTHA-hemi  . KNEE ARTHROPLASTY  01-25-11   '04-right, revised x2  . SPINAL CORD STIMULATOR IMPLANT  2009  APPROX   DR. ELSNER  . THORACIC SPINE SURGERY  01-25-11   6'02- then nerve stimulator implanted after  . TOTAL KNEE REVISION  02/01/2011   Procedure: TOTAL KNEE REVISION;  Surgeon: Mauri Pole;  Location: [facility];  Service: Orthopedics;  Laterality: Right;  . TUBAL LIGATION      Family History  Problem Relation Age of Onset  . Heart attack Father   . Emphysema Father   . Aneurysm Mother        CEREBRAL  . Emphysema Mother   . Dementia Mother   . Diabetes Son   . Hypertension Son   . Hypertension Son   . Cancer Sister        Widely Metastatic  . Asthma Son        x2  . Breast cancer Paternal Aunt   . Rheum arthritis Maternal Aunt     Social History:  reports that she has been smoking Cigarettes.  She started smoking about 55 years ago. She has a 37.50 pack-year smoking history. She has never used smokeless tobacco. She reports that she does not drink alcohol or use drugs.  Allergies  Allergen Reactions  . Albuterol Other (See Comments)    Had difficulty breathing after a nebulizer treatment  . Penicillins Anaphylaxis and Swelling    Has patient had a PCN reaction causing immediate rash, facial/tongue/throat swelling, SOB or lightheadedness with hypotension: Yes Has patient had a PCN reaction causing severe rash involving mucus membranes or skin necrosis: Rash Has patient had a PCN reaction that required hospitalization: No Has patient had a PCN reaction occurring within the last 10 years: No If all of the above answers are "NO", then may proceed with Cephalosporin use.  [name] [Kdc:Albuterol] Shortness Of Breath  . Sulfa Antibiotics Swelling    Throat swells, but no shortness of breath noted  . Zanaflex [Tizanidine] Other (See Comments)    Possible confusion (??)  . Codeine Nausea And Vomiting  . Ecotrin [Aspirin] Nausea And Vomiting    MEDICATIONS:  Prior to Admission:  Prescriptions Prior to Admission  Medication Sig Dispense Refill Last Dose  . apixaban (ELIQUIS) 5 MG TABS tablet Take 1 tablet (5 mg total) by mouth 2 (two) times daily. 60 tablet 6 10/02/2016 at am  . levalbuterol [facility] HFA) 45 MCG/ACT inhaler Inhale 2 puffs into the lungs every 4 (four) hours as needed for wheezing. 1 Inhaler 12  at prn  . levothyroxine (SYNTHROID) 100 MCG tablet Take 1 tablet (100 mcg total) by mouth daily before breakfast. 90 tablet 3 Past Month at Unknown time  . lisinopril (PRINIVIL,ZESTRIL) 5 MG tablet Take 1 tablet (5 mg total) by mouth daily. 30 tablet 11 10/02/2016 at Unknown time  . magnesium oxide (MAG-OX) 400 MG tablet Take 1 tablet (400 mg total) by mouth daily. (Patient taking differently: Take 400 mg by mouth daily as needed (nausea or reflux). ) 30 tablet 3 10/02/2016 at Unknown time  . pantoprazole (PROTONIX) 40 MG tablet Take 1 tablet (40 mg total) by mouth 2 (two) times daily. 60 tablet 0 10/01/2016 at Unknown time  . propranolol (INDERAL) 80 MG tablet Take 80 mg by mouth daily. For seven days Started on 09-29-16     . sertraline (ZOLOFT) 100 MG tablet Take 100 mg by mouth at bedtime.   10/01/2016 at Unknown time   Scheduled: . apixaban  5 mg Oral BID  . feeding supplement (ENSURE ENLIVE)  237 mL Oral BID BM  . levothyroxine  100 mcg Oral QAC breakfast  . lisinopril  5 mg Oral Daily  . nicotine  21 mg Transdermal Daily  . pantoprazole  40 mg Oral BID  . sertraline  100 mg Oral QHS     ROS:                                                                                                                                       History obtained from the patient  General ROS: negative for - chills, fatigue, fever, night sweats, weight gain or weight loss Psychological ROS: negative for - behavioral disorder, hallucinations, memory difficulties, mood swings or suicidal ideation Ophthalmic ROS: negative for - blurry vision, double vision,  eye pain or loss of vision ENT ROS: negative for - epistaxis, nasal discharge, oral lesions, sore throat, tinnitus or vertigo Allergy and Immunology ROS: negative for - hives or itchy/watery eyes Hematological and Lymphatic ROS: negative for - bleeding problems, bruising or swollen lymph nodes Endocrine ROS: negative for - galactorrhea, hair pattern changes, polydipsia/polyuria or temperature intolerance Respiratory ROS: negative for - cough, hemoptysis, shortness of breath or wheezing Cardiovascular ROS: negative for - chest pain, dyspnea on exertion, edema or irregular heartbeat Gastrointestinal ROS: negative for - abdominal pain, diarrhea, hematemesis, nausea/vomiting or stool incontinence Genito-Urinary ROS: negative for - dysuria, hematuria, incontinence or urinary frequency/urgency Musculoskeletal ROS: negative for - joint swelling or muscular weakness Neurological ROS: as noted in HPI Dermatological ROS:  negative for rash and skin lesion changes   Blood pressure (!) 158/84, pulse 98, temperature 98.4 F (36.9 C), temperature source Oral, resp. rate 16, height 5\' 6"  (1.676 m), weight 65.8 kg (145 lb), SpO2 99 %.   Neurologic Examination:                                                                                                      HEENT-  Normocephalic, no lesions, without obvious abnormality.  Normal external eye and conjunctiva.  Normal TM's bilaterally.  Normal auditory canals and external ears. Normal external nose, mucus membranes and septum.  Normal pharynx. Cardiovascular- S1, S2 normal, pulses palpable throughout   Lungs- chest clear, no wheezing, rales, normal symmetric air entry Abdomen- normal findings: bowel sounds normal Extremities- no edema Lymph-no adenopathy palpable Musculoskeletal-no joint tenderness, deformity or swelling Skin-warm and dry, no hyperpigmentation, vitiligo, or suspicious lesions  Neurological Examination Mental Status: Patient currently is  anxious, she is alert to [facility], month July, ear 2018 she is able to follow simple step such as holding her arms out and following commands such as raising her legs and doing musculoskeletal exam. Patient is perseverating on the fact that she does not feel right in that she is having a postural tremor. Cranial Nerves: II:  Visual fields grossly normal,  III,IV, VI: ptosis not present, extra-ocular motions intact bilaterally pupils equal, round, reactive to light and accommodation V,VII: smile symmetric, facial light touch sensation normal bilaterally VIII: hearing normal bilaterally IX,X: uvula rises symmetrically XI: bilateral shoulder shrug XII: midline tongue extension Motor: Right : Upper extremity   5/5    Left:     Upper extremity   5/5  Lower extremity   5/5     Lower extremity   5/5 Tone and bulk:normal tone throughout; no atrophy noted Sensory: Pinprick and light touch intact throughout, bilaterally Deep Tendon Reflexes: 2+ and symmetric throughout with the exception of a dropped right knee jerk secondary to surgery and bilateral ankle jerks which are 1+. Plantars: Right: downgoing   Left: downgoing Cerebellar: normal finger-to-nose and normal heel-to-shin test Gait: Not tested      Lab Results: Basic Metabolic Panel:  Recent Labs Lab 10/02/16 1719 10/03/16 0003 10/03/16 0602  NA 118* 123* 124*  K 3.5 3.7 3.6  CL 85* 92* 93*  CO2 25 26 23   GLUCOSE 134* 161* 119*  BUN 9 9 8   CREATININE 0.88 0.79 0.75  CALCIUM 9.1 8.4* 8.7*    Liver Function Tests:  Recent Labs Lab 10/02/16 1719  AST 21  ALT 16  ALKPHOS 94  BILITOT 0.5  PROT 6.6  ALBUMIN 3.9   No results for input(s): LIPASE, AMYLASE in the last 168 hours. No results for input(s): AMMONIA in the last 168 hours.  CBC:  Recent Labs Lab 10/02/16 1719  WBC 11.0*  HGB 13.6  HCT 37.5  MCV 85.2  PLT 492*    Cardiac Enzymes: No results for input(s): CKTOTAL, CKMB, CKMBINDEX, TROPONINI  in the last 168 hours.  Lipid Panel: No results for input(s): CHOL,  TRIG, HDL, CHOLHDL, VLDL, LDLCALC in the last 168 hours.  CBG: No results for input(s): GLUCAP in the last 168 hours.  Microbiology: Results for orders placed or performed during the hospital encounter of 08/15/16  MRSA PCR Screening     Status: None   Collection Time: 08/16/16 12:13 AM  Result Value Ref Range Status   MRSA by PCR NEGATIVE NEGATIVE Final    Comment:        The GeneXpert MRSA Assay (FDA approved for NASAL specimens only), is one component of a comprehensive MRSA colonization surveillance program. It is not intended to diagnose MRSA infection nor to guide or monitor treatment for MRSA infections.     Coagulation Studies: No results for input(s): LABPROT, INR in the last 72 hours.  Imaging: Dg Chest 2 View  Result Date: 10/02/2016 CLINICAL DATA:  Hyponatremia. Dizziness and confusion. Shortness of breath. EXAM: CHEST  2 VIEW COMPARISON:  Two-view chest x-ray 09/22/2016. FINDINGS: The heart size is normal. The lungs are clear. Chronic scarring at the right lung base is stable. There is no edema or effusion. No focal airspace disease is present. Upper thoracic kyphosis is stable. IMPRESSION: 1. No acute cardiopulmonary disease or significant interval change. Electronically Signed   By: San Morelle M.D.   On: 10/02/2016 20:41       Assessment and plan per attending neurologist  Etta Quill PA-C [facility] [phone]  10/03/2016, 3:06 PM Have seen the patient reviewed the above note.  Her onset has been relatively rapid, with the patient's husband stating that she did not have any problems prior to about February of this year. At the time of her first visit with Dr. Wynn Banker she scored a 20 out of 30 on the Mini-Mental. He felt like she was likely a multifactorial delirium and expected gradual improvement. She has not improved, and in fact has had increased weakness,  falls, forgetfulness.  On exam, she clearly has some short-term memory deficits she did not know the year or month for me.  Assessment/Plan: This is a 71 year old female with declining memory in the setting of hyponatremia. Most recent TSH which was obtained approximately 2 weeks ago was 0.0951 month prior to that it was 0.024, 3 months ago and was 1.2. Patient was admitted secondary to confusion and hyponatremia. Hyponatremia can sometimes be associated with cognitive deficiency, and she appears to bend slightly overtreated, though I would be surprised if this degree of hypothyroid will cause severity of her symptoms, but may be one of many factors. Her hyponatremia given the fall from 139-123 over the course of a couple of weeks could certainly be contributing to her mental status.  An MRI would be helpful, however unfortunately she is not a candidate due to spinal stimulator. I would favor getting an contrasted CT.   1) TSH 2) CT head with contrast 3) ammonia, RPR 4) neurology will continue to follow  Roland Rack, MD [facility] [phone]  If 7pm- 7am, please page neurology on call as listed in [place].

## 2016-10-03 NOTE — Patient Outreach (Signed)
   July,\ 13, 2018   Home visit for community care coordination. Call made to patient's primary caregiver due to husband and son's complaint of patient repetitively asking the same question, had bruising on right arm/hematomoa on right arm, gait is more unsteady  Spoke with Ms. Laurance Flatten, NP who made an appointment for patient at 345 pm today. Member's husband and son advised to have patient there on time, both stated intent to cmply.

## 2016-10-03 NOTE — Progress Notes (Signed)
Nutrition Brief Note  Patient identified on the Malnutrition Screening Tool (MST) Report  Wt Readings from Last 15 Encounters:  10/02/16 145 lb (65.8 kg)  09/26/16 134 lb (60.8 kg)  09/22/16 132 lb (59.9 kg)  09/05/16 132 lb (59.9 kg)  08/22/16 129 lb 8 oz (58.7 kg)  08/17/16 131 lb 4.8 oz (59.6 kg)  08/09/16 130 lb 9.6 oz (59.2 kg)  07/14/16 136 lb (61.7 kg)  07/03/16 138 lb (62.6 kg)  07/01/16 132 lb 6.4 oz (60.1 kg)  06/02/16 155 lb (70.3 kg)  05/20/16 154 lb 9.6 oz (70.1 kg)  04/27/16 152 lb 6.4 oz (69.1 kg)  04/20/16 154 lb 12.8 oz (70.2 kg)  12/09/15 155 lb 3.2 oz (70.4 kg)    Pt with 20 pound wt loss in March of this past year secondary to depression and poor po after her sister passed away. Weight has been stable since.   Body mass index is 23.4 kg/m.   Current diet order is Regular, patient is consuming approximately 100% of meals at this time. Pt with very good appetite, ate 100% of breakfast this AM, eating pack of NAB crackers while waiting on her lunch tray to arrive. Labs and medications reviewed.   No nutrition interventions warranted at this time. If nutrition issues arise, please consult RD.   Kerman Passey MS, RD, LDN [phone] Pager  [phone] Weekend/On-Call Pager

## 2016-10-04 LAB — BASIC METABOLIC PANEL
Anion gap: 10 (ref 5–15)
BUN: 5 mg/dL — AB (ref 6–20)
CALCIUM: 8.6 mg/dL — AB (ref 8.9–10.3)
CO2: 22 mmol/L (ref 22–32)
Chloride: 97 mmol/L — ABNORMAL LOW (ref 101–111)
Creatinine, Ser: 0.85 mg/dL (ref 0.44–1.00)
GFR calc Af Amer: >60 mL/min (ref >60)
GLUCOSE: 166 mg/dL — AB (ref 65–99)
Potassium: 3.3 mmol/L — ABNORMAL LOW (ref 3.5–5.1)
Sodium: 129 mmol/L — ABNORMAL LOW (ref 135–145)

## 2016-10-04 MED ORDER — LORAZEPAM 0.5 MG PO TABS
0.5000 mg | ORAL_TABLET | Freq: Four times a day (QID) | ORAL | Status: DC | PRN
Start: 2016-10-04 — End: 2016-10-12
  Administered 2016-10-04 – 2016-10-11 (×14): 0.5 mg via ORAL
  Filled 2016-10-04 (×16): qty 1

## 2016-10-04 NOTE — Progress Notes (Addendum)
Subjective: No significant changes  Exam: Vitals:   10/04/16 0516 10/04/16 0940  BP: 138/65 (!) 158/81  Pulse: 98 98  Resp: 17 17  Temp: 98.2 F (36.8 C) 98.5 F (36.9 C)   Gen: In bed, NAD Resp: non-labored breathing, no acute distress Abd: soft, nt  Neuro: MS: Awake, alert, gives month as June, not oriented to year she frequently  States things along lines of "well I would not know that because I can't remember"  CN: Pupils equal round reactive to light, extra movements intact Motor: Moves all extremities well Sensory: Intact to light touch  Pertinent Labs: TSH 0.45 Sodium 129   Impression:  This is a 71 year old female with declining memory in the setting of hyponatremia and overtreated hypothyroidism.   Hyperthyroidism can sometimes be associated with cognitive deficiency, and she appears to have been slightly overtreated, though I would be surprised if this degree of hyperthyroid will cause severity of her symptoms, but may be one of many factors. Her hyponatremia given the fall from 139-118 over the course of a couple of weeks could certainly be contributing to her mental status.  An MRI would be helpful, however unfortunately she is not a candidate due to spinal stimulator.   One condition that can cause both hyponatremia as well as encephalopathy is voltage-gated potassium channel antibodies. I will symptoms on the serum.  I do have some concerns given the character of her responses that there may be a component of pseudodementia. I would favor outpatient neuropsychometric testing  Spinal tap may be indicated, but given my concern for possible pseudodementia as well as the risk associated given her anticoagulation, I would favor neuropsych testing first.  Recommendations: 1) voltage-gated potassium channel antibody 2) outpatient neuropsychological testing 3) continue treatment of her underlying medical issues. 4) she can follow up with her outpatient  neurologist.  Roland Rack, MD [facility] [phone]  If 7pm- 7am, please page neurology on call as listed in [place].

## 2016-10-04 NOTE — Consult Note (Signed)
   [facility] Inpatient Consult   10/04/2016  Destiny Hughes [birth year] [id number]  Patient is currently active with [facility] for chronic disease management services.  Patient has been engaged by a [employer].  Our community based plan of care has focused on disease management and community resource support.  Met with the patient at the bedside.  Patient states she has been having vivid "bad" dreams that her "husband wants to put her in a nursing home" and that her heart is racing at times she feels.  Patient expressed feeling confused.  She consent to ongoing [facility] follow up.  Made Inpatient Case Manager aware that [facility] following. Of note, [facility] services does not replace or interfere with any services that are needed or arranged by inpatient case management or social work. Will follow for progress and disposition needs.  For additional questions or referrals please contact:  Natividad Brood, RN BSN [facility] Liaison  [phone] business mobile phone Toll free office [phone]

## 2016-10-04 NOTE — Progress Notes (Signed)
[facility]                                                                              Patient Demographics  Destiny Hughes, is a 71 y.o. female, DOB - 1946/02/09, WPV:[id number]  Admit date - 10/02/2016   Admitting Physician Etta Quill, DO  Outpatient Primary MD for the patient is Minette Brine  Outpatient specialists:   LOS - 2  days    Chief Complaint  Patient presents with  . Altered Mental Status  . Fall       Brief summary  Destiny Hughes is a 71 y.o. female with medical history significant for but not limited to COPD, hypothyroidism, and chronic hyponatremia presenting with a three-week history of progressively worsening confusion with weakness forget fullness and falls associated with hyponatremia of 118,  from 139 on 09/05/2016.  Of note, she lost her sister in February 2018 and had been grieving with poor oral intake culminating with insidious onset of confusion, felt to be due to combination of depression and associated dehydration due to poor intake,  and hyponatremia with narcotic withdrawal.  Assessment & Plan    Principal Problem:   Hyponatremia Active Problems:   Essential hypertension   Emphysema lung (HCC)   Atrial flutter (HCC)   #1 Altered Mental Status: Etiology possibly toxic metabolic with ?? underlying pseudodementia Supportive care for now Consider neurologic recommends- voltage-gated potassium channel antibody- pending outpatient neuropsychological testing continue treatment of her underlying medical issues. May benefit from psychiatric eval  #2 Hyponatremia: Etiology unclear Possibly due to decreased oral intake/dehydration Improving with saline infusion Monitor renal function  Code Status: Full code DVT Prophylaxis: On Eliquis Family Communication: Discussed with husband  Disposition Plan: To be determined  Time Spent in minutes 20 minutes  Procedures:    Consultants:    Neurology  Antimicrobials:      Medications  Scheduled Meds: . apixaban  5 mg Oral BID  . feeding supplement (ENSURE ENLIVE)  237 mL Oral BID BM  . levothyroxine  100 mcg Oral QAC breakfast  . lisinopril  5 mg Oral Daily  . nicotine  21 mg Transdermal Daily  . pantoprazole  40 mg Oral BID  . sertraline  100 mg Oral QHS   Continuous Infusions: . sodium chloride 100 mL/hr at 10/02/16 2111   PRN Meds:.acetaminophen, levalbuterol, LORazepam, magnesium oxide, ondansetron **OR** ondansetron (ZOFRAN) IV, zolpidem   Antibiotics   Anti-infectives    None        Subjective:   [place] Sylvan was seen and examined today.No new complaint  Objective:   Vitals:   10/03/16 1700 10/03/16 2100 10/04/16 0100 10/04/16 0516  BP: (!) 158/84 (!) 173/87 (!) 187/102 138/65  Pulse: (!) 104 (!) 114 (!) 118 98  Resp: 16 17 18 17   Temp: 98.7 F (37.1 C) 98.7 F (37.1 C) 98.6 F (37 C) 98.2 F (36.8 C)  TempSrc: Oral Oral Oral Oral  SpO2: 98% 97%  95%  Weight:  65.3 kg (144 lb)    Height:        Intake/Output Summary (Last 24 hours) at 10/04/16 0827 Last data  filed at 10/04/16 5625  Gross per 24 hour  Intake             1090 ml  Output                0 ml  Net             1090 ml     Wt Readings from Last 3 Encounters:  10/03/16 65.3 kg (144 lb)  09/26/16 60.8 kg (134 lb)  09/22/16 59.9 kg (132 lb)     Exam  General: NAD  HEENT: NCAT,  PERRL,MMM  Neck: SUPPLE, (-) JVD  Cardiovascular: RRR, (-) GALLOP, (-) MURMUR  Respiratory: CTA  Gastrointestinal: SOFT, (-) DISTENSION, BS(+), TENDERNESS  Ext: (-) CYANOSIS, (-) EDEMA  Neuro: A, OX 2   Psych:NORMAL AFFECT/MOOD   Data Reviewed:  I have personally reviewed following labs and imaging studies  Micro Results No results found for this or any previous visit (from the past 240 hour(s)).  Radiology Reports Dg Chest 2 View  Result Date: 10/02/2016 CLINICAL DATA:  Hyponatremia. Dizziness and confusion.  Shortness of breath. EXAM: CHEST  2 VIEW COMPARISON:  Two-view chest x-ray 09/22/2016. FINDINGS: The heart size is normal. The lungs are clear. Chronic scarring at the right lung base is stable. There is no edema or effusion. No focal airspace disease is present. Upper thoracic kyphosis is stable. IMPRESSION: 1. No acute cardiopulmonary disease or significant interval change. Electronically Signed   By: San Morelle M.D.   On: 10/02/2016 20:41   Dg Chest 2 View  Result Date: 09/22/2016 CLINICAL DATA:  Tachycardia for 1 week. Chest pain of unknown duration. EXAM: CHEST  2 VIEW COMPARISON:  Single-view of the chest 08/15/2016. PA and lateral chest 08/03/2016. FINDINGS: The lungs are clear. Heart size is normal. No pneumothorax or pleural effusion. Mild pleural scarring along the periphery of the right lung base is unchanged. No acute bony abnormality. The patient is status post cervical fusion. Spinal stimulator is in place. IMPRESSION: No acute disease. Electronically Signed   By: Inge Rise M.D.   On: 09/22/2016 10:39   Ct Head W & Wo Contrast  Result Date: 10/04/2016 CLINICAL DATA:  Increasing confusion for 3 weeks, falls and forgetfulness. History of hypertension, hyperlipidemia, atrial fibrillation. EXAM: CT HEAD WITHOUT AND WITH CONTRAST TECHNIQUE: Contiguous axial images were obtained from the base of the skull through the vertex without and with intravenous contrast CONTRAST:  12mL ISOVUE-300 IOPAMIDOL (ISOVUE-300) INJECTION 61% COMPARISON:  CT HEAD July 08, 2016 FINDINGS: BRAIN: No intraparenchymal hemorrhage, mass effect nor midline shift. The ventricles and sulci are normal for age. RIGHT frontal developmental venous anomaly. Patchy supratentorial white matter hypodensities less than expected for patient's age, though non-specific are most compatible with chronic small vessel ischemic disease. No acute large vascular territory infarcts. No abnormal extra-axial fluid collections. Basal  cisterns are patent. No suspicious intracranial enhancement. VASCULAR: Mild calcific atherosclerosis of the carotid siphons. SKULL: No skull fracture. No significant scalp soft tissue swelling. SINUSES/ORBITS: The mastoid air-cells and included paranasal sinuses are well-aerated.The included ocular globes and orbital contents are non-suspicious. OTHER: None. IMPRESSION: Negative CT HEAD with and without contrast for age. Electronically Signed   By: Elon Alas M.D.   On: 10/04/2016 00:40    Lab Data:  CBC:  Recent Labs Lab 10/02/16 1719  WBC 11.0*  HGB 13.6  HCT 37.5  MCV 85.2  PLT 638*   Basic Metabolic Panel:  Recent Labs Lab 10/02/16 1719 10/03/16  0003 10/03/16 0602  NA 118* 123* 124*  K 3.5 3.7 3.6  CL 85* 92* 93*  CO2 25 26 23   GLUCOSE 134* 161* 119*  BUN 9 9 8   CREATININE 0.88 0.79 0.75  CALCIUM 9.1 8.4* 8.7*   GFR: Estimated Creatinine Clearance: 58.6 mL/min (by C-G formula based on SCr of 0.75 mg/dL). Liver Function Tests:  Recent Labs Lab 10/02/16 1719  AST 21  ALT 16  ALKPHOS 94  BILITOT 0.5  PROT 6.6  ALBUMIN 3.9   No results for input(s): LIPASE, AMYLASE in the last 168 hours.  Recent Labs Lab 10/03/16 2104  AMMONIA 11   Coagulation Profile: No results for input(s): INR, PROTIME in the last 168 hours. Cardiac Enzymes: No results for input(s): CKTOTAL, CKMB, CKMBINDEX, TROPONINI in the last 168 hours. BNP (last 3 results) No results for input(s): PROBNP in the last 8760 hours. HbA1C: No results for input(s): HGBA1C in the last 72 hours. CBG: No results for input(s): GLUCAP in the last 168 hours. Lipid Profile: No results for input(s): CHOL, HDL, LDLCALC, TRIG, CHOLHDL, LDLDIRECT in the last 72 hours. Thyroid Function Tests:  Recent Labs  10/03/16 1551  TSH 0.496   Anemia Panel: No results for input(s): VITAMINB12, FOLATE, FERRITIN, TIBC, IRON, RETICCTPCT in the last 72 hours. Urine analysis:    Component Value Date/Time    COLORURINE STRAW (A) 10/02/2016 1818   APPEARANCEUR CLEAR 10/02/2016 1818   LABSPEC 1.004 (L) 10/02/2016 1818   PHURINE 7.0 10/02/2016 1818   GLUCOSEU NEGATIVE 10/02/2016 1818   HGBUR NEGATIVE 10/02/2016 1818   BILIRUBINUR NEGATIVE 10/02/2016 1818   KETONESUR NEGATIVE 10/02/2016 1818   PROTEINUR NEGATIVE 10/02/2016 1818   UROBILINOGEN 0.2 02/17/2014 1026   NITRITE NEGATIVE 10/02/2016 1818   LEUKOCYTESUR NEGATIVE 10/02/2016 1818     OSEI-BONSU,Brighton Delio M.D. [facility] 10/04/2016, 8:27 AM  Pager: [phone] Between 7am to 7pm - call Pager - [phone]  After 7pm go to [email or web address] - password [email or web address]  Call night coverage person covering after 7pm

## 2016-10-05 LAB — BASIC METABOLIC PANEL
ANION GAP: 9 (ref 5–15)
BUN: 8 mg/dL (ref 6–20)
CALCIUM: 9 mg/dL (ref 8.9–10.3)
CO2: 28 mmol/L (ref 22–32)
Chloride: 96 mmol/L — ABNORMAL LOW (ref 101–111)
Creatinine, Ser: 0.76 mg/dL (ref 0.44–1.00)
GLUCOSE: 102 mg/dL — AB (ref 65–99)
POTASSIUM: 3.5 mmol/L (ref 3.5–5.1)
Sodium: 133 mmol/L — ABNORMAL LOW (ref 135–145)

## 2016-10-05 LAB — RPR: RPR Ser Ql: NONREACTIVE

## 2016-10-05 MED ORDER — HYDRALAZINE HCL 20 MG/ML IJ SOLN
10.0000 mg | INTRAMUSCULAR | Status: DC | PRN
  Administered 2016-10-05: 10 mg via INTRAVENOUS
  Filled 2016-10-05: qty 1

## 2016-10-05 NOTE — Clinical Social Work Note (Signed)
CSW was told that patient's husband is interested in SNF placement for her following discharge. PT/OT evaluations pending. Patient's husband not at bedside so CSW left him a Advertising account executive.  Dayton Scrape, [place]

## 2016-10-05 NOTE — Progress Notes (Signed)
  Speech Language Pathology Treatment: Dysphagia  Patient Details Name: Destiny Hughes MRN: [id number] DOB: Jul 15, 1945 Today's Date: 10/05/2016 Time: 7829-5621 SLP Time Calculation (min) (ACUTE ONLY): 11 min  Assessment / Plan / Recommendation Clinical Impression  Pt less confused than Monday but anxious/paranoid that her "husband is trying to put me in a nursing home. He can't do that can he? I've been walking to the bathroom at night fine." She continues to have a subtle wet quality to voice noted after thin liquids which clears shortly after. No acute pulmonary disease and no change in nurses documented respirations. Continue regular/thin. No further ST needed. COPD increases aspiration risk therefore if pt is readmitted with respiratory difficulties or other illness and exhibits dysphagia, would recommend objective assessment at that time.    HPI HPI: Destiny Mccollister Trollingeris a 71 y.o.femalewith medical history significant of COPD, GERD, neuromuscular disorder, hypothyroidism, depression following death of sister back in 2022-06-21, hyponatremia chronically, admitted in April this year for sodium 121 which was believed to be due to poor PO intake. This admission pt brought in by family for confusion, weakness, falls, and forgetfullness. Found to have hyponatremia. CXR No acute cardiopulmonary disease or significant interval change.      SLP Plan  All goals met;Discharge SLP treatment due to (comment)       Recommendations  Diet recommendations: Regular;Thin liquid Liquids provided via: Cup;Straw Medication Administration: Whole meds with liquid Supervision: Patient able to self feed Compensations: Slow rate;Small sips/bites;Clear throat intermittently Postural Changes and/or Swallow Maneuvers: Seated upright 90 degrees                Oral Care Recommendations: Oral care BID Follow up Recommendations: None SLP Visit Diagnosis: Dysphagia, unspecified (R13.10) Plan: All goals  met;Discharge SLP treatment due to (comment)       GO                Houston Siren 10/05/2016, 11:22 AM  Orbie Pyo Colvin Caroli.Ed [employer] [phone]

## 2016-10-05 NOTE — Progress Notes (Signed)
PROGRESS NOTE    Destiny Hughes  VPX:[id number] DOB: 1946/03/04 DOA: 10/02/2016 PCP: Minette Brine    Brief Narrative:  71 y.o.femalewith medical history significant for but not limited to COPD, hypothyroidism, and chronic hyponatremia presenting with a three-week history of progressively worsening confusion with weakness forget fullness and falls associated with hyponatremia of 118,  from 139 on 09/05/2016.  Of note, she lost her sister in February 2018 and had been grieving with poor oral intake culminating with insidious onset of confusion, felt to be due to combination of depression and associated dehydration due to poor intake,  and hyponatremia with narcotic withdrawal.  Assessment & Plan:   Principal Problem:   Hyponatremia Active Problems:   Essential hypertension   Emphysema lung (HCC)   Atrial flutter (HCC)  #1 Altered Mental Status: Suspect toxicmetabolic encephalopathy Continue with supportive care for now Neurology has recommended voltage-gated potassium channel antibody- pending outpatient neuropsychological testing continue treatment of her underlying medical issues. Seems somewhat improved  #2 Hyponatremia: - Etiology unclear - Possibly due to decreased oral intake/dehydration - Improving with saline infusion - Repeat bmet in AM  #3 HTN - BP poorly controlled - Have added PRN hydralazine  #4 Emphysema - Stable at present - No wheezing on exam  #5 A. Flutter - Rate controlled - Stable  DVT prophylaxis: Eliquis Code Status: Full Family Communication: Pt in room, family not at bedside Disposition Plan: Uncertain at this time  Consultants:   Neurology  Procedures:     Antimicrobials: Anti-infectives    None       Subjective: Feeling better today. Still somewhat confused  Objective: Vitals:   10/05/16 0018 10/05/16 0545 10/05/16 0852 10/05/16 1026  BP: (!) 159/110 (!) 194/114 (!) 168/94 (!) 147/85  Pulse: (!) 104 (!) 111      Resp: 17 18    Temp: 98 F (36.7 C) 98.7 F (37.1 C)    TempSrc: Oral     SpO2: 96% 97%    Weight:  60.8 kg (134 lb)    Height:        Intake/Output Summary (Last 24 hours) at 10/05/16 1736 Last data filed at 10/05/16 1643  Gross per 24 hour  Intake          5518.33 ml  Output             1950 ml  Net          3568.33 ml   Filed Weights   10/02/16 2246 10/03/16 2100 10/05/16 0545  Weight: 65.8 kg (145 lb) 65.3 kg (144 lb) 60.8 kg (134 lb)    Examination:  General exam: Appears calm and comfortable  Respiratory system: Clear to auscultation. Respiratory effort normal. Cardiovascular system: S1 & S2 heard, RRR. Gastrointestinal system: Abdomen is nondistended, soft and nontender. No organomegaly or masses felt. Normal bowel sounds heard. Central nervous system: Alert and oriented. No focal neurological deficits. Extremities: Symmetric 5 x 5 power. Skin: No rashes, lesions  Psychiatry: Judgement and insight appear normal. Mood & affect appropriate.   Data Reviewed: I have personally reviewed following labs and imaging studies  CBC:  Recent Labs Lab 10/02/16 1719  WBC 11.0*  HGB 13.6  HCT 37.5  MCV 85.2  PLT 462*   Basic Metabolic Panel:  Recent Labs Lab 10/02/16 1719 10/03/16 0003 10/03/16 0602 10/04/16 0947 10/05/16 0433  NA 118* 123* 124* 129* 133*  K 3.5 3.7 3.6 3.3* 3.5  CL 85* 92* 93* 97* 96*  CO2 25  26 23 22 28   GLUCOSE 134* 161* 119* 166* 102*  BUN 9 9 8  5* 8  CREATININE 0.88 0.79 0.75 0.85 0.76  CALCIUM 9.1 8.4* 8.7* 8.6* 9.0   GFR: Estimated Creatinine Clearance: 58.6 mL/min (by C-G formula based on SCr of 0.76 mg/dL). Liver Function Tests:  Recent Labs Lab 10/02/16 1719  AST 21  ALT 16  ALKPHOS 94  BILITOT 0.5  PROT 6.6  ALBUMIN 3.9   No results for input(s): LIPASE, AMYLASE in the last 168 hours.  Recent Labs Lab 10/03/16 2104  AMMONIA 11   Coagulation Profile: No results for input(s): INR, PROTIME in the last 168  hours. Cardiac Enzymes: No results for input(s): CKTOTAL, CKMB, CKMBINDEX, TROPONINI in the last 168 hours. BNP (last 3 results) No results for input(s): PROBNP in the last 8760 hours. HbA1C: No results for input(s): HGBA1C in the last 72 hours. CBG: No results for input(s): GLUCAP in the last 168 hours. Lipid Profile: No results for input(s): CHOL, HDL, LDLCALC, TRIG, CHOLHDL, LDLDIRECT in the last 72 hours. Thyroid Function Tests:  Recent Labs  10/03/16 1551  TSH 0.496   Anemia Panel: No results for input(s): VITAMINB12, FOLATE, FERRITIN, TIBC, IRON, RETICCTPCT in the last 72 hours. Sepsis Labs: No results for input(s): PROCALCITON, LATICACIDVEN in the last 168 hours.  No results found for this or any previous visit (from the past 240 hour(s)).   Radiology Studies: Ct Head W & Wo Contrast  Result Date: 10/04/2016 CLINICAL DATA:  Increasing confusion for 3 weeks, falls and forgetfulness. History of hypertension, hyperlipidemia, atrial fibrillation. EXAM: CT HEAD WITHOUT AND WITH CONTRAST TECHNIQUE: Contiguous axial images were obtained from the base of the skull through the vertex without and with intravenous contrast CONTRAST:  75mL ISOVUE-300 IOPAMIDOL (ISOVUE-300) INJECTION 61% COMPARISON:  CT HEAD July 08, 2016 FINDINGS: BRAIN: No intraparenchymal hemorrhage, mass effect nor midline shift. The ventricles and sulci are normal for age. RIGHT frontal developmental venous anomaly. Patchy supratentorial white matter hypodensities less than expected for patient's age, though non-specific are most compatible with chronic small vessel ischemic disease. No acute large vascular territory infarcts. No abnormal extra-axial fluid collections. Basal cisterns are patent. No suspicious intracranial enhancement. VASCULAR: Mild calcific atherosclerosis of the carotid siphons. SKULL: No skull fracture. No significant scalp soft tissue swelling. SINUSES/ORBITS: The mastoid air-cells and included  paranasal sinuses are well-aerated.The included ocular globes and orbital contents are non-suspicious. OTHER: None. IMPRESSION: Negative CT HEAD with and without contrast for age. Electronically Signed   By: Elon Alas M.D.   On: 10/04/2016 00:40    Scheduled Meds: . apixaban  5 mg Oral BID  . feeding supplement (ENSURE ENLIVE)  237 mL Oral BID BM  . levothyroxine  100 mcg Oral QAC breakfast  . lisinopril  5 mg Oral Daily  . nicotine  21 mg Transdermal Daily  . pantoprazole  40 mg Oral BID  . sertraline  100 mg Oral QHS   Continuous Infusions: . sodium chloride 10 mL/hr at 10/05/16 1143     LOS: 3 days   Alease Fait, Orpah Melter, MD [facility] Pager [phone]  If 7PM-7AM, please contact night-coverage [email or web address] Password [facility] 10/05/2016, 5:36 PM

## 2016-10-06 LAB — SODIUM
SODIUM: 126 mmol/L — AB (ref 135–145)
SODIUM: 126 mmol/L — AB (ref 135–145)
SODIUM: 126 mmol/L — AB (ref 135–145)

## 2016-10-06 LAB — BASIC METABOLIC PANEL
ANION GAP: 7 (ref 5–15)
BUN: 7 mg/dL (ref 6–20)
CALCIUM: 9.4 mg/dL (ref 8.9–10.3)
CO2: 30 mmol/L (ref 22–32)
CREATININE: 0.86 mg/dL (ref 0.44–1.00)
Chloride: 90 mmol/L — ABNORMAL LOW (ref 101–111)
Glucose, Bld: 108 mg/dL — ABNORMAL HIGH (ref 65–99)
Potassium: 4.4 mmol/L (ref 3.5–5.1)
SODIUM: 127 mmol/L — AB (ref 135–145)

## 2016-10-06 MED ORDER — SODIUM CHLORIDE 0.9 % IV SOLN
INTRAVENOUS | Status: DC
  Administered 2016-10-06: 11:00:00 via INTRAVENOUS

## 2016-10-06 MED ORDER — PROPRANOLOL HCL 20 MG PO TABS
80.0000 mg | ORAL_TABLET | Freq: Every day | ORAL | Status: DC
  Administered 2016-10-06 – 2016-10-12 (×7): 80 mg via ORAL
  Filled 2016-10-06 (×7): qty 4

## 2016-10-06 NOTE — Care Management Important Message (Signed)
Important Message  Patient Details  Name: Destiny Hughes MRN: [id number] Date of Birth: 1945/09/23   Medicare Important Message Given:  Yes    Orbie Pyo 10/06/2016, 12:21 PM

## 2016-10-06 NOTE — Evaluation (Signed)
Physical Therapy Evaluation Patient Details Name: Destiny Hughes MRN: [id number] DOB: 02/11/1946 Today's Date: 10/06/2016   History of Present Illness  71 y/o female admitted from home with confusion, weakness, falls, toxic metabolic encephalopathy.  PMHx:  a-flutter, emphysema, COPD, lung collapse, HTN, chronic pain with R knee failed TKA x 3  Clinical Impression  Pt is getting up to walk with PT and noted impulsivity and safety issues, although she is able to walk with minor contact.  Given her R TKA failures and her falls, recommend HHPT for follow up to ensure home safety, and will progress therapy here for a longer walk as tolerated.  Nursing may assist her as well to increase distances and safety, and to monitor her vitals with O2 sat and pulses.      Follow Up Recommendations Supervision for mobility/OOB;Home health PT    Equipment Recommendations  None recommended by PT    Recommendations for Other Services       Precautions / Restrictions Precautions Precautions: Fall Restrictions Weight Bearing Restrictions: No      Mobility  Bed Mobility Overal bed mobility: Needs Assistance Bed Mobility: Supine to Sit;Sit to Supine     Supine to sit: Supervision Sit to supine: Supervision   General bed mobility comments: safety guarding due to falls, with mats on floor and bed alarm in place  Transfers Overall transfer level: Needs assistance Equipment used: 1 person hand held assist (IV pole) Transfers: Sit to/from Stand Sit to Stand: Supervision;Min guard         General transfer comment: reminders for safety and to be mindful of IV  Ambulation/Gait Ambulation/Gait assistance: Min guard Ambulation Distance (Feet): 45 Feet Assistive device: 1 person hand held assist (IV pole) Gait Pattern/deviations: Step-through pattern;Decreased stride length;Wide base of support;Shuffle Gait velocity: reduced Gait velocity interpretation: Below normal speed for  age/gender General Gait Details: pt is not attending to details of how close to obstacles she is, which may be PLOF  Stairs            Wheelchair Mobility    Modified Rankin (Stroke Patients Only)       Balance Overall balance assessment: History of Falls                                           Pertinent Vitals/Pain Pain Assessment: Faces Faces Pain Scale: Hurts a little bit Pain Location: R knee brusing Pain Descriptors / Indicators: Sore Pain Intervention(s): Monitored during session;Premedicated before session;Repositioned    Home Living Family/patient expects to be discharged to:: Private residence Living Arrangements: Spouse/significant other Available Help at Discharge: Family;Available 24 hours/day Type of Home: House Home Access: Stairs to enter Entrance Stairs-Rails: Chemical engineer of Steps: 2 Home Layout: One level Home Equipment: Walker - 2 wheels;Cane - single point;Shower seat      Prior Function Level of Independence: Needs assistance   Gait / Transfers Assistance Needed: pt did not need AD prior to this but has them  ADL's / Homemaking Assistance Needed: husband assists her care and the home        Hand Dominance   Dominant Hand: Right    Extremity/Trunk Assessment   Upper Extremity Assessment Upper Extremity Assessment: Overall WFL for tasks assessed    Lower Extremity Assessment Lower Extremity Assessment: Overall WFL for tasks assessed    Cervical / Trunk Assessment Cervical / Trunk Assessment:  Normal  Communication   Communication: No difficulties  Cognition Arousal/Alertness: Awake/alert Behavior During Therapy: Impulsive Overall Cognitive Status: History of cognitive impairments - at baseline                                        General Comments General comments (skin integrity, edema, etc.): has bruising on Lateral R knee from fall at home and was already in some  minor discomfort from failed TKA    Exercises     Assessment/Plan    PT Assessment Patient needs continued PT services  PT Problem List Decreased strength;Decreased range of motion;Decreased activity tolerance;Decreased balance;Decreased mobility;Decreased coordination;Decreased cognition;Decreased knowledge of use of DME;Decreased safety awareness;Decreased skin integrity;Pain       PT Treatment Interventions DME instruction;Gait training;Functional mobility training;Therapeutic activities;Therapeutic exercise;Balance training;Neuromuscular re-education;Patient/family education    PT Goals (Current goals can be found in the Care Plan section)  Acute Rehab PT Goals Patient Stated Goal: none stated x to get to BR PT Goal Formulation: With patient/family Time For Goal Achievement: 10/13/16 Potential to Achieve Goals: Good    Frequency Min 3X/week   Barriers to discharge   has family to assist her mobility    Co-evaluation               AM-PAC PT "6 Clicks" Daily Activity  Outcome Measure Difficulty turning over in bed (including adjusting bedclothes, sheets and blankets)?: A Little Difficulty moving from lying on back to sitting on the side of the bed? : A Little Difficulty sitting down on and standing up from a chair with arms (e.g., wheelchair, bedside commode, etc,.)?: Total Help needed moving to and from a bed to chair (including a wheelchair)?: A Little Help needed walking in hospital room?: A Little Help needed climbing 3-5 steps with a railing? : A Little 6 Click Score: 16    End of Session Equipment Utilized During Treatment: Gait belt Activity Tolerance: Patient tolerated treatment well;Patient limited by pain Patient left: in bed;with call bell/phone within reach;with bed alarm set;with family/visitor present Nurse Communication: Mobility status PT Visit Diagnosis: Unsteadiness on feet (R26.81);History of falling (Z91.81);Pain Pain - Right/Left: Right Pain  - part of body: Knee    Time: 3202-3343 PT Time Calculation (min) (ACUTE ONLY): 16 min   Charges:   PT Evaluation $PT Eval Low Complexity: 1 Procedure     PT G Codes:   PT G-Codes **NOT FOR INPATIENT CLASS** Functional Assessment Tool Used: AM-PAC 6 Clicks Basic Mobility    Ramond Dial 10/06/2016, 3:14 PM   Mee Hives, PT MS Acute Rehab Dept. Number: [place] and [place]

## 2016-10-06 NOTE — Progress Notes (Signed)
PROGRESS NOTE    Destiny Hughes  JWJ:[id number] DOB: Jan 27, 1946 DOA: 10/02/2016 PCP: Minette Brine    Brief Narrative:  71 y.o.femalewith medical history significant for but not limited to COPD, hypothyroidism, and chronic hyponatremia presenting with a three-week history of progressively worsening confusion with weakness forget fullness and falls associated with hyponatremia of 118,  from 139 on 09/05/2016.  Of note, she lost her sister in February 2018 and had been grieving with poor oral intake culminating with insidious onset of confusion, felt to be due to combination of depression and associated dehydration due to poor intake,  and hyponatremia with narcotic withdrawal.  Assessment & Plan:   Principal Problem:   Hyponatremia Active Problems:   Essential hypertension   Emphysema lung (HCC)   Atrial flutter (HCC)  #1 Altered Mental Status: Suspect toxicmetabolic encephalopathy Continue with supportive care for now Neurology has recommended voltage-gated potassium channel antibody- pending outpatient neuropsychological testing continue treatment of her underlying medical issues. Stable, although patient remains mildly confused (oriented x 1 - to place)  #2 Hyponatremia: - Etiology unclear - Possibly due to decreased oral intake/dehydration - Initially improved with IVF hydration, worse with IVF stopped - Will resume IVF. Check q4h sodium levels to ensure improvement  #3 HTN - Have added PRN hydralazine - Stable at present  #4 Emphysema - Stable at present - No wheezing on exam  #5 A. Flutter - Rate controlled - Stable  DVT prophylaxis: Eliquis Code Status: Full Family Communication: Pt in room, family not at bedside Disposition Plan: Uncertain at this time  Consultants:   Neurology  Procedures:     Antimicrobials: Anti-infectives    None      Subjective: Asking about going home  Objective: Vitals:   10/05/16 2259 10/06/16 0616 10/06/16  0903 10/06/16 1401  BP: (!) 158/80 (!) 150/76 (!) 158/89   Pulse: (!) 105 95 (!) 105 (!) 113  Resp: 18 18 18    Temp: 98.5 F (36.9 C) 98.1 F (36.7 C) 98.2 F (36.8 C)   TempSrc: Oral Oral Oral   SpO2: 98% 95% 95% 96%  Weight: 61.7 kg (136 lb)     Height:        Intake/Output Summary (Last 24 hours) at 10/06/16 1610 Last data filed at 10/06/16 1503  Gross per 24 hour  Intake           917.17 ml  Output              500 ml  Net           417.17 ml   Filed Weights   10/03/16 2100 10/05/16 0545 10/05/16 2259  Weight: 65.3 kg (144 lb) 60.8 kg (134 lb) 61.7 kg (136 lb)    Examination: General exam: Awake, laying in bed, in nad Respiratory system: Normal respiratory effort, no wheezing Cardiovascular system: regular rate, s1, s2 Gastrointestinal system: Soft, nondistended, positive BS Central nervous system: CN2-12 grossly intact, strength intact Extremities: Perfused, no clubbing Skin: Normal skin turgor, no notable skin lesions seen Psychiatry: Mood normal // no visual hallucinations    Data Reviewed: I have personally reviewed following labs and imaging studies  CBC:  Recent Labs Lab 10/02/16 1719  WBC 11.0*  HGB 13.6  HCT 37.5  MCV 85.2  PLT 621*   Basic Metabolic Panel:  Recent Labs Lab 10/03/16 0003 10/03/16 0602 10/04/16 0947 10/05/16 0433 10/06/16 0527 10/06/16 1036 10/06/16 1443  NA 123* 124* 129* 133* 127* 126* 126*  K 3.7 3.6  3.3* 3.5 4.4  --   --   CL 92* 93* 97* 96* 90*  --   --   CO2 26 23 22 28 30   --   --   GLUCOSE 161* 119* 166* 102* 108*  --   --   BUN 9 8 5* 8 7  --   --   CREATININE 0.79 0.75 0.85 0.76 0.86  --   --   CALCIUM 8.4* 8.7* 8.6* 9.0 9.4  --   --    GFR: Estimated Creatinine Clearance: 54.5 mL/min (by C-G formula based on SCr of 0.86 mg/dL). Liver Function Tests:  Recent Labs Lab 10/02/16 1719  AST 21  ALT 16  ALKPHOS 94  BILITOT 0.5  PROT 6.6  ALBUMIN 3.9   No results for input(s): LIPASE, AMYLASE in the  last 168 hours.  Recent Labs Lab 10/03/16 2104  AMMONIA 11   Coagulation Profile: No results for input(s): INR, PROTIME in the last 168 hours. Cardiac Enzymes: No results for input(s): CKTOTAL, CKMB, CKMBINDEX, TROPONINI in the last 168 hours. BNP (last 3 results) No results for input(s): PROBNP in the last 8760 hours. HbA1C: No results for input(s): HGBA1C in the last 72 hours. CBG: No results for input(s): GLUCAP in the last 168 hours. Lipid Profile: No results for input(s): CHOL, HDL, LDLCALC, TRIG, CHOLHDL, LDLDIRECT in the last 72 hours. Thyroid Function Tests: No results for input(s): TSH, T4TOTAL, FREET4, T3FREE, THYROIDAB in the last 72 hours. Anemia Panel: No results for input(s): VITAMINB12, FOLATE, FERRITIN, TIBC, IRON, RETICCTPCT in the last 72 hours. Sepsis Labs: No results for input(s): PROCALCITON, LATICACIDVEN in the last 168 hours.  No results found for this or any previous visit (from the past 240 hour(s)).   Radiology Studies: No results found.  Scheduled Meds: . apixaban  5 mg Oral BID  . feeding supplement (ENSURE ENLIVE)  237 mL Oral BID BM  . levothyroxine  100 mcg Oral QAC breakfast  . lisinopril  5 mg Oral Daily  . nicotine  21 mg Transdermal Daily  . pantoprazole  40 mg Oral BID  . propranolol  80 mg Oral Daily  . sertraline  100 mg Oral QHS   Continuous Infusions: . sodium chloride 10 mL/hr at 10/05/16 1143  . sodium chloride 100 mL/hr at 10/06/16 1040     LOS: 4 days   CHIU, Orpah Melter, MD [facility] Pager [phone]  If 7PM-7AM, please contact night-coverage [email or web address] Password [email or web address] 10/06/2016, 4:10 PM

## 2016-10-07 ENCOUNTER — Inpatient Hospital Stay (HOSPITAL_COMMUNITY): Payer: Medicare Other

## 2016-10-07 LAB — BASIC METABOLIC PANEL
ANION GAP: 9 (ref 5–15)
BUN: 8 mg/dL (ref 6–20)
CHLORIDE: 91 mmol/L — AB (ref 101–111)
CO2: 29 mmol/L (ref 22–32)
Calcium: 9.6 mg/dL (ref 8.9–10.3)
Creatinine, Ser: 0.8 mg/dL (ref 0.44–1.00)
GFR calc non Af Amer: >60 mL/min (ref >60)
Glucose, Bld: 106 mg/dL — ABNORMAL HIGH (ref 65–99)
POTASSIUM: 3.7 mmol/L (ref 3.5–5.1)
SODIUM: 129 mmol/L — AB (ref 135–145)

## 2016-10-07 LAB — OSMOLALITY, URINE: OSMOLALITY UR: 516 mosm/kg (ref 300–900)

## 2016-10-07 LAB — SODIUM
SODIUM: 127 mmol/L — AB (ref 135–145)
Sodium: 124 mmol/L — ABNORMAL LOW (ref 135–145)

## 2016-10-07 LAB — OSMOLALITY: OSMOLALITY: 258 mosm/kg — AB (ref 275–295)

## 2016-10-07 LAB — SODIUM, URINE, RANDOM: SODIUM UR: 78 mmol/L

## 2016-10-07 MED ORDER — SODIUM CHLORIDE 0.9 % IV SOLN
INTRAVENOUS | Status: DC
  Administered 2016-10-07 – 2016-10-10 (×4): via INTRAVENOUS

## 2016-10-07 NOTE — Evaluation (Signed)
Occupational Therapy Evaluation Patient Details Name: Destiny Hughes MRN: [id number] DOB: Apr 10, 1945 Today's Date: 10/07/2016    History of Present Illness 71 y/o female admitted from home with confusion, weakness, falls, toxic metabolic encephalopathy.  PMHx:  a-flutter, emphysema, COPD, lung collapse, HTN, chronic pain with R knee failed TKA x 3   Clinical Impression   Pt admitted with above. She demonstrates the below listed deficits and will benefit from continued OT to maximize safety and independence with BADLs.  Pt demonstrates significant memory impairments, and requires supervision - min guard assist for ADLs.  She has h/o frequent falls.   Family is very supportive, recommend HHOT at discharge.       Follow Up Recommendations  Home health OT;Supervision/Assistance - 24 hour    Equipment Recommendations  None recommended by OT    Recommendations for Other Services       Precautions / Restrictions Precautions Precautions: Fall Restrictions Weight Bearing Restrictions: No      Mobility Bed Mobility Overal bed mobility: Modified Independent                Transfers Overall transfer level: Needs assistance Equipment used: 1 person hand held assist Transfers: Sit to/from Stand;Stand Pivot Transfers Sit to Stand: Supervision Stand pivot transfers: Supervision            Balance Overall balance assessment: History of Falls                                         ADL either performed or assessed with clinical judgement   ADL Overall ADL's : Needs assistance/impaired Eating/Feeding: Independent   Grooming: Wash/dry hands;Wash/dry face;Oral care;Brushing hair;Supervision/safety;Standing   Upper Body Bathing: Set up;Sitting   Lower Body Bathing: Min guard;Sit to/from stand   Upper Body Dressing : Set up;Sitting   Lower Body Dressing: Min guard;Sit to/from stand   Toilet Transfer: Supervision/safety;Ambulation;Comfort height  toilet;Grab bars   Toileting- Clothing Manipulation and Hygiene: Sit to/from stand;Min guard       Functional mobility during ADLs: Min guard General ADL Comments: pt forgets what activities she has performed.  She physically is able to complete ADLs, requires min guard - supervision for balance due ot h/o falls      Vision Patient Visual Report: No change from baseline       Perception     Praxis      Pertinent Vitals/Pain Pain Assessment: Faces Faces Pain Scale: Hurts a little bit Pain Location: R knee  Pain Descriptors / Indicators: Aching;Grimacing Pain Intervention(s): Monitored during session     Hand Dominance Right   Extremity/Trunk Assessment Upper Extremity Assessment Upper Extremity Assessment: Overall WFL for tasks assessed   Lower Extremity Assessment Lower Extremity Assessment: Overall WFL for tasks assessed   Cervical / Trunk Assessment Cervical / Trunk Assessment: Normal   Communication Communication Communication: No difficulties   Cognition Arousal/Alertness: Awake/alert Behavior During Therapy: Impulsive Overall Cognitive Status: History of cognitive impairments - at baseline                                 General Comments: Pt demonstrates significant memory deficits.  Unable to retain info asking same questions repeatedly.  Son reports this is new onset since 3/18    General Comments  son present during eval     Exercises  Shoulder Instructions      Home Living Family/patient expects to be discharged to:: Private residence Living Arrangements: Spouse/significant other Available Help at Discharge: Family;Available 24 hours/day Type of Home: House Home Access: Stairs to enter [employer] of Steps: 2 Entrance Stairs-Rails: Left;Right Home Layout: One level     Bathroom Shower/Tub: Teacher, early years/pre: Standard     Home Equipment: Environmental consultant - 2 wheels;Cane - single point;Shower seat    Additional Comments: Pt lives with pt and son with spouse being primary caregiver       Prior Functioning/Environment Level of Independence: Needs assistance  Gait / Transfers Assistance Needed: Per son, pt ambulated intermittently with SPC.  H/o falling  ADL's / Homemaking Assistance Needed: Per son, spouse assists pt with bathing dressing and IADLs.  They have to supervise all activities due to new onset of memory loss with pt not recalling what she has and has not done             OT Problem List: Decreased strength;Decreased activity tolerance;Impaired balance (sitting and/or standing);Decreased cognition;Decreased safety awareness;Decreased knowledge of use of DME or AE      OT Treatment/Interventions: Self-care/ADL training;DME and/or AE instruction;Therapeutic activities;Patient/family education;Balance training;Cognitive remediation/compensation    OT Goals(Current goals can be found in the care plan section) Acute Rehab OT Goals Patient Stated Goal: per son, for cognition to improve  OT Goal Formulation: With patient/family Time For Goal Achievement: 10/14/16 Potential to Achieve Goals: Good ADL Goals Additional ADL Goal #1: Pt will complete am ADLs with supervision and no more than 2 cues with use of check list  to improve recall   OT Frequency: Min 2X/week   Barriers to D/C:            Co-evaluation              AM-PAC PT "6 Clicks" Daily Activity     Outcome Measure Help from another person eating meals?: None Help from another person taking care of personal grooming?: A Little Help from another person toileting, which includes using toliet, bedpan, or urinal?: A Little Help from another person bathing (including washing, rinsing, drying)?: A Little Help from another person to put on and taking off regular upper body clothing?: A Little Help from another person to put on and taking off regular lower body clothing?: A Little 6 Click Score: 19   End of  Session Nurse Communication: Mobility status  Activity Tolerance: Patient tolerated treatment well Patient left: in bed;with bed alarm set;with family/visitor present  OT Visit Diagnosis: Unsteadiness on feet (R26.81);Cognitive communication deficit (R41.841)                Time: 0160-1093 OT Time Calculation (min): 26 min Charges:  OT General Charges $OT Visit: 1 Procedure OT Evaluation $OT Eval Moderate Complexity: 1 Procedure OT Treatments $Self Care/Home Management : 8-22 mins G-Codes:     [facility], OTR/L [phone]   Lucille Passy M 10/07/2016, 11:43 AM

## 2016-10-07 NOTE — Progress Notes (Signed)
OT Cancellation Note  Patient Details Name: Destiny Hughes MRN: [id number] DOB: 1946-02-26   Cancelled Treatment:    Reason Eval/Treat Not Completed: Other (comment) (eating breakfast).  Victor, OTR/L [phone]   Lucille Passy M 10/07/2016, 9:01 AM

## 2016-10-07 NOTE — Progress Notes (Signed)
Helped pt up to bathroom. After pt was back in bed she stated she felt dizzy, out of breath and had some chest pain.   Upon asking her to rate her chest pain she asked her husband if she had chest pain. There was some confusion but pt stated her chest felt heavy.   Ausculted lungs, wheezing in bilateral upper lobes. Given nebulizer tx. Vital signs taken and WDL. O2 saturation 97% on RA.   Asked pt again if she had pain and she refused any pain.    Notified MD Wyline Copas of incident.  Will continue to monitor  Paulla Fore, RN

## 2016-10-07 NOTE — Progress Notes (Signed)
PROGRESS NOTE    Destiny Hughes Legacy  ZOX:[id number] DOB: 04-01-1945 DOA: 10/02/2016 PCP: Minette Brine    Brief Narrative:  71 y.o.femalewith medical history significant for but not limited to COPD, hypothyroidism, and chronic hyponatremia presenting with a three-week history of progressively worsening confusion with weakness forget fullness and falls associated with hyponatremia of 118,  from 139 on 09/05/2016.  Of note, she lost her sister in February 2018 and had been grieving with poor oral intake culminating with insidious onset of confusion, felt to be due to combination of depression and associated dehydration due to poor intake,  and hyponatremia with narcotic withdrawal.  Assessment & Plan:   Principal Problem:   Hyponatremia Active Problems:   Essential hypertension   Emphysema lung (HCC)   Atrial flutter (HCC)  #1 Altered Mental Status: Suspect toxicmetabolic encephalopathy Continue with supportive care for now Neurology has recommended voltage-gated potassium channel antibody- pending outpatient neuropsychological testing continue treatment of her underlying medical issues. Overall stable. Suspect related to peresistently low sodium. Plan per below Will consult Psychiatry  #2 Hyponatremia: - Suspected due to decreased oral intake/dehydration - Initially improved with IVF hydration, worse with IVF stopped - Trended down with IVF on 7/19, later improved after fluid restricting patient - sodium presently 127. Will discuss case with Nephrology  #3 HTN - Have added PRN hydralazine - Remains stable at present  #4 Emphysema - Stable at present - without wheezing   #5 A. Flutter - Rate controlled - currently stable  DVT prophylaxis: Eliquis Code Status: Full Family Communication: Pt in room, family at bedside Disposition Plan: Uncertain at this time  Consultants:   Neurology  Procedures:     Antimicrobials: Anti-infectives    None       Subjective: Eager to go home. Confused this AM  Objective: Vitals:   10/06/16 2031 10/07/16 0501 10/07/16 0900 10/07/16 1452  BP: (!) 152/72 (!) 158/86 (!) 148/88 (!) 151/76  Pulse: 80 73 78 72  Resp: 17 18 18    Temp:  98.6 F (37 C) 97.6 F (36.4 C)   TempSrc:   Oral   SpO2: 98% 98% 99% 97%  Weight:      Height:        Intake/Output Summary (Last 24 hours) at 10/07/16 1605 Last data filed at 10/07/16 0900  Gross per 24 hour  Intake              240 ml  Output                0 ml  Net              240 ml   Filed Weights   10/03/16 2100 10/05/16 0545 10/05/16 2259  Weight: 65.3 kg (144 lb) 60.8 kg (134 lb) 61.7 kg (136 lb)    Examination: General exam: Conversant, in no acute distress Respiratory system: normal chest rise, clear, no audible wheezing Cardiovascular system: regular rhythm, s1-s2 Gastrointestinal system: Nondistended, nontender, pos BS Central nervous system: No seizures, no tremors Extremities: No cyanosis, no joint deformities Skin: No rashes, no pallor Psychiatry: Affect normal // no auditory hallucinations   Data Reviewed: I have personally reviewed following labs and imaging studies  CBC:  Recent Labs Lab 10/02/16 1719  WBC 11.0*  HGB 13.6  HCT 37.5  MCV 85.2  PLT 811*   Basic Metabolic Panel:  Recent Labs Lab 10/03/16 0602 10/04/16 0947 10/05/16 0433 10/06/16 0527 10/06/16 1036 10/06/16 1443 10/06/16 2005 10/07/16 0445 10/07/16 1145  NA 124* 129* 133* 127* 126* 126* 126* 129* 127*  K 3.6 3.3* 3.5 4.4  --   --   --  3.7  --   CL 93* 97* 96* 90*  --   --   --  91*  --   CO2 23 22 28 30   --   --   --  29  --   GLUCOSE 119* 166* 102* 108*  --   --   --  106*  --   BUN 8 5* 8 7  --   --   --  8  --   CREATININE 0.75 0.85 0.76 0.86  --   --   --  0.80  --   CALCIUM 8.7* 8.6* 9.0 9.4  --   --   --  9.6  --    GFR: Estimated Creatinine Clearance: 58.6 mL/min (by C-G formula based on SCr of 0.8 mg/dL). Liver Function  Tests:  Recent Labs Lab 10/02/16 1719  AST 21  ALT 16  ALKPHOS 94  BILITOT 0.5  PROT 6.6  ALBUMIN 3.9   No results for input(s): LIPASE, AMYLASE in the last 168 hours.  Recent Labs Lab 10/03/16 2104  AMMONIA 11   Coagulation Profile: No results for input(s): INR, PROTIME in the last 168 hours. Cardiac Enzymes: No results for input(s): CKTOTAL, CKMB, CKMBINDEX, TROPONINI in the last 168 hours. BNP (last 3 results) No results for input(s): PROBNP in the last 8760 hours. HbA1C: No results for input(s): HGBA1C in the last 72 hours. CBG: No results for input(s): GLUCAP in the last 168 hours. Lipid Profile: No results for input(s): CHOL, HDL, LDLCALC, TRIG, CHOLHDL, LDLDIRECT in the last 72 hours. Thyroid Function Tests: No results for input(s): TSH, T4TOTAL, FREET4, T3FREE, THYROIDAB in the last 72 hours. Anemia Panel: No results for input(s): VITAMINB12, FOLATE, FERRITIN, TIBC, IRON, RETICCTPCT in the last 72 hours. Sepsis Labs: No results for input(s): PROCALCITON, LATICACIDVEN in the last 168 hours.  No results found for this or any previous visit (from the past 240 hour(s)).   Radiology Studies: No results found.  Scheduled Meds: . apixaban  5 mg Oral BID  . feeding supplement (ENSURE ENLIVE)  237 mL Oral BID BM  . levothyroxine  100 mcg Oral QAC breakfast  . lisinopril  5 mg Oral Daily  . nicotine  21 mg Transdermal Daily  . pantoprazole  40 mg Oral BID  . propranolol  80 mg Oral Daily  . sertraline  100 mg Oral QHS   Continuous Infusions: . sodium chloride 10 mL/hr at 10/05/16 1143     LOS: 5 days   Takeya Marquis, Orpah Melter, MD [facility] Pager [phone]  If 7PM-7AM, please contact night-coverage [email or web address] Password [email or web address] 10/07/2016, 4:05 PM

## 2016-10-07 NOTE — Clinical Social Work Note (Signed)
Clinical Social Work Assessment  Patient Details  Name: Destiny Hughes MRN: [id number] Date of Birth: Aug 01, 1945  Date of referral:  10/07/16               Reason for consult:  Facility Placement                Permission sought to share information with:  Facility Sport and exercise psychologist, Family Supports Permission granted to share information::  Yes, Verbal Permission Granted  Name::     Bari, Educational psychologist::  SNFs  Relationship::  Spouse   Contact Information:  [phone]   Housing/Transportation Living arrangements for the past 2 months:  [place] of Information:  Patient, Adult Children Patient Interpreter Needed:  None Criminal Activity/Legal Involvement Pertinent to Current Situation/Hospitalization:  No - Comment as needed Significant Relationships:  Adult Children, Spouse Lives with:  Spouse Do you feel safe going back to the place where you live?  Yes Need for family participation in patient care:  Yes (Comment)  Care giving concerns: Patient is from home with husband. Patient will be re-evaluated by PT for SNF, as patient's husband cannot fully manage her care at home.   Social Worker assessment / plan: CSW met with patient and son at bedside. Patient disoriented about situation. Son agreeable to SNF. CSW will send out referrals and support with discharge. Son with concern about patient's recent change in mental status, indicating in March patient suddenly started having trouble with short term memory.  Employment status:  Retired Forensic scientist:  Medicare PT Recommendations:  [place] / Referral to community resources:  [place]  Patient/Family's Response to care: Patient pleasant and appreciative of care.  Patient/Family's Understanding of and Emotional Response to Diagnosis, Current Treatment, and Prognosis: Family with good understanding of patient's medical conditions, though with  questions about patient's mental status. Family hopeful for some mobility recovery in SNF.  Emotional Assessment Appearance:  Appears stated age Attitude/Demeanor/Rapport:  Other (disoriented but pleasant) Affect (typically observed):  Appropriate, Calm, Other (disoriented) Orientation:  Oriented to Self, Oriented to Place Alcohol / Substance use:  Not Applicable Psych involvement (Current and /or in the community):  No (Comment)  Discharge Needs  Concerns to be addressed:  Discharge Planning Concerns Readmission within the last 30 days:  No Current discharge risk:  Physical Impairment Barriers to Discharge:  Continued Medical Work up   Estanislado Emms, LCSW 10/07/2016, 12:15 PM

## 2016-10-07 NOTE — Progress Notes (Signed)
PT Cancellation Note  Patient Details Name: Destiny Hughes MRN: [id number] DOB: 1946/02/08   Cancelled Treatment:    Reason Eval/Treat Not Completed: Medical issues which prohibited therapy Social work and Therapist, sports requesting PT to see pt, however, pt feeling lightheaded and dizzy and asking to come back later. Will reattempt as schedule allows.  Leighton Ruff, PT, DPT  Acute Rehabilitation Services  Pager: [phone]    Rudean Hitt 10/07/2016, 2:55 PM

## 2016-10-07 NOTE — Progress Notes (Signed)
CSW met with patient and husband at bedside and gave SNF bed offers, based on earlier conversation with family in which family was agreeable to SNF for rehab. Husband with significant concern about patient's mental status. Per MD, patient's altered mental status due to sodium levels. CSW reinforced MD report of reason for patient confusion. Husband continues to be preoccupied with patient's mental status. Husband now not agreeable to SNF and also notes he is unable to care for patient in home 24/7. CSW consulted with MD and MD planned to order psychiatry evaluation for patient. CSW provided empathy and reassurance to husband regarding the situation.  CSW to continue to follow and will support with disposition planning.  Estanislado Emms, [place]

## 2016-10-07 NOTE — NC FL2 (Signed)
[place] LEVEL OF CARE SCREENING TOOL     IDENTIFICATION  Patient Name: Destiny Hughes Birthdate: [birth year] Sex: female Admission Date (Current Location): 10/02/2016  [facility] and [place] Number:  Herbalist and Address:  [facility] [place], [place] [address] [place], [place] [address]      Provider Number: [id number]  Attending Physician Name and Address:  Donne Hazel, MD  Relative Name and Phone Number:  Nailani Full, Spouse [phone]     Current Level of Care: Hospital Recommended Level of Care: [place] Prior Approval Number:    Date Approved/Denied:   PASRR Number: [id number]  Discharge Plan: SNF    Current Diagnoses: Patient Active Problem List   Diagnosis Date Noted  . Atrial flutter ([place]) 08/15/2016  . Atrial fibrillation with RVR ([place]) 08/15/2016  . SVT (supraventricular tachycardia) ([place]) 07/11/2016  . Weight loss 07/08/2016  . AKI (acute kidney injury) ([place]) 07/02/2016  . Acute kidney injury ([place]) 07/01/2016  . Hyperglycemia 07/01/2016  . Hyponatremia 07/01/2016  . Intractable nausea and vomiting 07/01/2016  . Abnormal EKG 07/01/2016  . Diastolic CHF ([place]) 18/29/9371  . Medication management 04/28/2015  . Emphysema lung ([place]) 12/26/2014  . GERD (gastroesophageal reflux disease) 12/26/2014  . Chronic bronchitis ([place]) 11/13/2014  . COPD (chronic obstructive pulmonary disease) ([place]) 10/03/2014  . At high risk for falls 10/03/2014  . Hypothyroidism 10/03/2014  . Mitral valve prolapse 10/03/2014  . Encounter for Medicare annual wellness exam 10/03/2014  . IBS (irritable bowel syndrome) 04/01/2014  . H/O total knee replacement 04/22/2013  . Essential hypertension 04/15/2013  . Vitamin D deficiency 04/15/2013  . Encounter for long-term (current) use of medications 04/15/2013  . Prediabetes 04/15/2013  . Osteoarthritis 01/31/2011  . Hyperlipemia   . Glaucoma   . Chronic pain  disorder   . Chronic back pain     Orientation RESPIRATION BLADDER Height & Weight     Self, Place  Normal Continent Weight: 136 lb (61.7 kg) Height:  5\' 6"  (167.6 cm)  BEHAVIORAL SYMPTOMS/MOOD NEUROLOGICAL BOWEL NUTRITION STATUS      Continent Diet (regular)  AMBULATORY STATUS COMMUNICATION OF NEEDS Skin   Limited Assist Verbally Skin abrasions (ecchymosis on arms)                       Personal Care Assistance Level of Assistance  Bathing, Feeding, Dressing Bathing Assistance: Limited assistance Feeding assistance: Independent Dressing Assistance: Limited assistance     Functional Limitations Info  Sight, Hearing, Speech Sight Info: Adequate Hearing Info: Adequate Speech Info: Adequate    SPECIAL CARE FACTORS FREQUENCY  PT (By licensed PT), OT (By licensed OT)     PT Frequency: 5x/week OT Frequency: 5x/week            Contractures Contractures Info: Not present    Additional Factors Info  Code Status, Allergies, Psychotropic Code Status Info: Full Allergies Info: Albuterol, Penicillins, Ventolin IRC:[id number], Sulfa Antibiotics, Zanaflex Tizanidine, Codeine, Ecotrin Aspirin Psychotropic Info: ativan (PRN), zoloft         Current Medications (10/07/2016):  This is the current hospital active medication list Current Facility-Administered Medications  Medication Dose Route Frequency Provider Last Rate Last Dose  . 0.9 %  sodium chloride infusion   Intravenous Continuous Donne Hazel, MD 10 mL/hr at 10/05/16 1143    . acetaminophen (TYLENOL) tablet 650 mg  650 mg Oral Q6H PRN Etta Quill, DO   650 mg at  10/07/16 1448  . apixaban (ELIQUIS) tablet 5 mg  5 mg Oral BID Jennette Kettle M, DO   5 mg at 10/07/16 1047  . feeding supplement (ENSURE ENLIVE) (ENSURE ENLIVE) liquid 237 mL  237 mL Oral BID BM Jennette Kettle M, DO   237 mL at 10/06/16 1311  . hydrALAZINE (APRESOLINE) injection 10 mg  10 mg Intravenous Q4H PRN Donne Hazel, MD   10 mg at 10/05/16  2395  . levalbuterol (XOPENEX) nebulizer solution 0.63 mg  0.63 mg Nebulization Q4H PRN Etta Quill, DO      . levothyroxine (SYNTHROID, LEVOTHROID) tablet 100 mcg  100 mcg Oral QAC breakfast Etta Quill, DO   100 mcg at 10/07/16 0827  . lisinopril (PRINIVIL,ZESTRIL) tablet 5 mg  5 mg Oral Daily Jennette Kettle M, DO   5 mg at 10/07/16 1049  . LORazepam (ATIVAN) tablet 0.5 mg  0.5 mg Oral Q6H PRN Osei-Bonsu, Iona Beard, MD   0.5 mg at 10/06/16 1311  . magnesium oxide (MAG-OX) tablet 400 mg  400 mg Oral Daily PRN Etta Quill, DO      . nicotine (NICODERM CQ - dosed in mg/24 hours) patch 21 mg  21 mg Transdermal Daily Jennette Kettle M, DO   21 mg at 10/07/16 1050  . ondansetron (ZOFRAN) tablet 4 mg  4 mg Oral Q6H PRN Etta Quill, DO       Or  . ondansetron [facility]) injection 4 mg  4 mg Intravenous Q6H PRN Etta Quill, DO      . pantoprazole (PROTONIX) EC tablet 40 mg  40 mg Oral BID Jennette Kettle M, DO   40 mg at 10/07/16 1047  . propranolol (INDERAL) tablet 80 mg  80 mg Oral Daily Donne Hazel, MD   80 mg at 10/07/16 1047  . sertraline (ZOLOFT) tablet 100 mg  100 mg Oral QHS Jennette Kettle M, DO   100 mg at 10/06/16 2114  . zolpidem (AMBIEN) tablet 5 mg  5 mg Oral QHS PRN Etta Quill, DO   5 mg at 10/06/16 2114     Discharge Medications: Please see discharge summary for a list of discharge medications.  Relevant Imaging Results:  Relevant Lab Results:   Additional Information SSN: [id number]  Estanislado Emms, LCSW

## 2016-10-08 DIAGNOSIS — F0631 Mood disorder due to known physiological condition with depressive features: Secondary | ICD-10-CM

## 2016-10-08 DIAGNOSIS — Z810 Family history of intellectual disabilities: Secondary | ICD-10-CM

## 2016-10-08 DIAGNOSIS — R4189 Other symptoms and signs involving cognitive functions and awareness: Secondary | ICD-10-CM

## 2016-10-08 DIAGNOSIS — F17210 Nicotine dependence, cigarettes, uncomplicated: Secondary | ICD-10-CM

## 2016-10-08 LAB — CBC
HEMATOCRIT: 34.3 % — AB (ref 36.0–46.0)
Hemoglobin: 11.5 g/dL — ABNORMAL LOW (ref 12.0–15.0)
MCH: 29.9 pg (ref 26.0–34.0)
MCHC: 33.5 g/dL (ref 30.0–36.0)
MCV: 89.1 fL (ref 78.0–100.0)
PLATELETS: 424 10*3/uL — AB (ref 150–400)
RBC: 3.85 MIL/uL — ABNORMAL LOW (ref 3.87–5.11)
RDW: 13.3 % (ref 11.5–15.5)
WBC: 8.7 10*3/uL (ref 4.0–10.5)

## 2016-10-08 LAB — BASIC METABOLIC PANEL
Anion gap: 5 (ref 5–15)
BUN: 11 mg/dL (ref 6–20)
CO2: 29 mmol/L (ref 22–32)
CREATININE: 0.79 mg/dL (ref 0.44–1.00)
Calcium: 9 mg/dL (ref 8.9–10.3)
Chloride: 95 mmol/L — ABNORMAL LOW (ref 101–111)
GFR calc Af Amer: >60 mL/min (ref >60)
GLUCOSE: 108 mg/dL — AB (ref 65–99)
POTASSIUM: 4.1 mmol/L (ref 3.5–5.1)
Sodium: 129 mmol/L — ABNORMAL LOW (ref 135–145)

## 2016-10-08 LAB — SODIUM
SODIUM: 129 mmol/L — AB (ref 135–145)
Sodium: 129 mmol/L — ABNORMAL LOW (ref 135–145)
Sodium: 127 mmol/L — ABNORMAL LOW (ref 135–145)

## 2016-10-08 MED ORDER — GABAPENTIN 100 MG PO CAPS
100.0000 mg | ORAL_CAPSULE | Freq: Two times a day (BID) | ORAL | Status: DC
  Administered 2016-10-08 – 2016-10-12 (×8): 100 mg via ORAL
  Filled 2016-10-08 (×9): qty 1

## 2016-10-08 MED ORDER — VENLAFAXINE HCL ER 37.5 MG PO CP24
37.5000 mg | ORAL_CAPSULE | Freq: Every day | ORAL | Status: DC
  Administered 2016-10-09 – 2016-10-12 (×4): 37.5 mg via ORAL
  Filled 2016-10-08 (×4): qty 1

## 2016-10-08 NOTE — Progress Notes (Signed)
Pt c/o panic attack and needing oxygen. Lung sounds with expiratory wheezing noted. Gave PRN xopenex and ativan for anxiety as ordered.
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
## 2016-10-08 NOTE — Progress Notes (Signed)
PROGRESS NOTE    Destiny Hughes  UQJ:[id number] DOB: 07-21-1945 DOA: 10/02/2016 PCP: Minette Brine    Brief Narrative:  71 y.o.femalewith medical history significant for but not limited to COPD, hypothyroidism, and chronic hyponatremia presenting with a three-week history of progressively worsening confusion with weakness forget fullness and falls associated with hyponatremia of 118,  from 139 on 09/05/2016.  Of note, she lost her sister in February 2018 and had been grieving with poor oral intake culminating with insidious onset of confusion, felt to be due to combination of depression and associated dehydration due to poor intake,  and hyponatremia with narcotic withdrawal.  Assessment & Plan:   Principal Problem:   Hyponatremia Active Problems:   Essential hypertension   Emphysema lung (HCC)   Atrial flutter (HCC)   Depression due to physical illness   Cognitive retention disorder  #1 Altered Mental Status: Suspect toxicmetabolic encephalopathy Continue with supportive care for now Neurology has recommended voltage-gated potassium channel antibody- pending outpatient neuropsychological testing continue treatment of her underlying medical issues. Overall stable. Suspect related to peresistently low sodium. Plan per below Will consult Psychiatry  #2 Hyponatremia: - Suspected due to decreased oral intake/dehydration - Initially improved with IVF hydration, worse with IVF stopped - Trended down with IVF on 7/19, later improved after fluid restricting patient - With fluid restriction, Na trended down to 124 and IVF was resumed -Labs reviewed. Na now 129. Cont IVF as tolerated -Suspect pt may develop SIADH as urine OSM and Na are now higher  #3 HTN - Have added PRN hydralazine - Presently stable  #4 Emphysema - Stable at present - No wheezing on exam   #5 A. Flutter - Rate controlled - Presently stable  DVT prophylaxis: Eliquis Code Status: Full Family  Communication: Pt in room, family at bedside Disposition Plan: Uncertain at this time  Consultants:   Neurology  Psychatry  Procedures:     Antimicrobials: Anti-infectives    None      Subjective: Confused. Questioning about going home  Objective: Vitals:   10/07/16 1900 10/08/16 0359 10/08/16 0959 10/08/16 1000  BP: 128/61 133/60 (!) 146/75 (!) 164/71  Pulse: 66 69 78 91  Resp: 18 18  20   Temp: 98.5 F (36.9 C) 98.2 F (36.8 C)  98 F (36.7 C)  TempSrc: Oral Oral  Oral  SpO2: 98% 95%  96%  Weight: 61.2 kg (135 lb)     Height:        Intake/Output Summary (Last 24 hours) at 10/08/16 1522 Last data filed at 10/08/16 1001  Gross per 24 hour  Intake             1615 ml  Output              400 ml  Net             1215 ml   Filed Weights   10/05/16 0545 10/05/16 2259 10/07/16 1900  Weight: 60.8 kg (134 lb) 61.7 kg (136 lb) 61.2 kg (135 lb)    Examination: General exam: Awake, laying in bed, in nad Respiratory system: Normal respiratory effort, no wheezing Cardiovascular system: regular rate, s1, s2 Gastrointestinal system: Soft, nondistended, positive BS Central nervous system: CN2-12 grossly intact, strength intact Extremities: Perfused, no clubbing Skin: Normal skin turgor, no notable skin lesions seen Psychiatry: Mood normal // no visual hallucinations , appears forgetful  Data Reviewed: I have personally reviewed following labs and imaging studies  CBC:  Recent Labs Lab 10/02/16  1719 10/08/16 0408  WBC 11.0* 8.7  HGB 13.6 11.5*  HCT 37.5 34.3*  MCV 85.2 89.1  PLT 492* 283*   Basic Metabolic Panel:  Recent Labs Lab 10/04/16 0947 10/05/16 0433 10/06/16 0527  10/07/16 0445 10/07/16 1145 10/07/16 1644 10/08/16 0408 10/08/16 1140  NA 129* 133* 127*  < > 129* 127* 124* 129* 129*  K 3.3* 3.5 4.4  --  3.7  --   --  4.1  --   CL 97* 96* 90*  --  91*  --   --  95*  --   CO2 22 28 30   --  29  --   --  29  --   GLUCOSE 166* 102* 108*  --   106*  --   --  108*  --   BUN 5* 8 7  --  8  --   --  11  --   CREATININE 0.85 0.76 0.86  --  0.80  --   --  0.79  --   CALCIUM 8.6* 9.0 9.4  --  9.6  --   --  9.0  --   < > = values in this interval not displayed. GFR: Estimated Creatinine Clearance: 58.6 mL/min (by C-G formula based on SCr of 0.79 mg/dL). Liver Function Tests:  Recent Labs Lab 10/02/16 1719  AST 21  ALT 16  ALKPHOS 94  BILITOT 0.5  PROT 6.6  ALBUMIN 3.9   No results for input(s): LIPASE, AMYLASE in the last 168 hours.  Recent Labs Lab 10/03/16 2104  AMMONIA 11   Coagulation Profile: No results for input(s): INR, PROTIME in the last 168 hours. Cardiac Enzymes: No results for input(s): CKTOTAL, CKMB, CKMBINDEX, TROPONINI in the last 168 hours. BNP (last 3 results) No results for input(s): PROBNP in the last 8760 hours. HbA1C: No results for input(s): HGBA1C in the last 72 hours. CBG: No results for input(s): GLUCAP in the last 168 hours. Lipid Profile: No results for input(s): CHOL, HDL, LDLCALC, TRIG, CHOLHDL, LDLDIRECT in the last 72 hours. Thyroid Function Tests: No results for input(s): TSH, T4TOTAL, FREET4, T3FREE, THYROIDAB in the last 72 hours. Anemia Panel: No results for input(s): VITAMINB12, FOLATE, FERRITIN, TIBC, IRON, RETICCTPCT in the last 72 hours. Sepsis Labs: No results for input(s): PROCALCITON, LATICACIDVEN in the last 168 hours.  No results found for this or any previous visit (from the past 240 hour(s)).   Radiology Studies: Dg Chest Port 1 View  Result Date: 10/07/2016 CLINICAL DATA:  Shortness of breath, COPD, smoker EXAM: PORTABLE CHEST 1 VIEW COMPARISON:  Portable exam at 1732 hrs compared to 10/02/2016 FINDINGS: Intraspinal stimulator lead projects over upper thoracic spine at the T2-T3 levels. Normal heart size, mediastinal contours, and pulmonary vascularity. Atherosclerotic calcification aorta. Minimal central peribronchial thickening with scarring at lateral lower RIGHT  chest. Lungs mildly hyperinflated but otherwise clear. No pleural effusion or pneumothorax. Bones demineralized. IMPRESSION: Minimal bronchitic changes and RIGHT basilar scarring. No acute abnormalities. Aortic Atherosclerosis (ICD10-I70.0). Electronically Signed   By: Lavonia Dana M.D.   On: 10/07/2016 17:54    Scheduled Meds: . apixaban  5 mg Oral BID  . feeding supplement (ENSURE ENLIVE)  237 mL Oral BID BM  . gabapentin  100 mg Oral BID  . levothyroxine  100 mcg Oral QAC breakfast  . lisinopril  5 mg Oral Daily  . nicotine  21 mg Transdermal Daily  . pantoprazole  40 mg Oral BID  . propranolol  80 mg Oral  Daily  . [START ON 10/09/2016] venlafaxine XR  37.5 mg Oral Q breakfast   Continuous Infusions: . sodium chloride 10 mL/hr at 10/05/16 1143  . sodium chloride 100 mL/hr at 10/08/16 1105     LOS: 6 days   Tomislav Micale, Orpah Melter, MD [facility] Pager [phone]  If 7PM-7AM, please contact night-coverage [email or web address] Password [email or web address] 10/08/2016, 3:22 PM

## 2016-10-08 NOTE — Progress Notes (Signed)
PT Cancellation Note  Patient Details Name: Destiny Hughes MRN: [id number] DOB: [birth year]   Cancelled Treatment:    Reason Eval/Treat Not Completed: Patient at procedure or test/unavailable.  Pt seeing psychiatry on arrival. 10/08/2016  Donnella Sham, [place] [phone]  (pager)   Tessie Fass [name] 10/08/2016, 12:35 PM

## 2016-10-09 LAB — BASIC METABOLIC PANEL
Anion gap: 5 (ref 5–15)
BUN: 11 mg/dL (ref 6–20)
CHLORIDE: 100 mmol/L — AB (ref 101–111)
CO2: 27 mmol/L (ref 22–32)
CREATININE: 0.85 mg/dL (ref 0.44–1.00)
Calcium: 8.7 mg/dL — ABNORMAL LOW (ref 8.9–10.3)
GFR calc Af Amer: >60 mL/min (ref >60)
GFR calc non Af Amer: >60 mL/min (ref >60)
GLUCOSE: 106 mg/dL — AB (ref 65–99)
Potassium: 4.2 mmol/L (ref 3.5–5.1)
Sodium: 132 mmol/L — ABNORMAL LOW (ref 135–145)

## 2016-10-09 NOTE — Progress Notes (Signed)
PROGRESS NOTE    Destiny Hughes  WCH:[id number] DOB: Jun 10, 1945 DOA: 10/02/2016 PCP: Minette Brine    Brief Narrative:  71 y.o.femalewith medical history significant for but not limited to COPD, hypothyroidism, and chronic hyponatremia presenting with a three-week history of progressively worsening confusion with weakness forget fullness and falls associated with hyponatremia of 118,  from 139 on 09/05/2016.  Of note, she lost her sister in February 2018 and had been grieving with poor oral intake culminating with insidious onset of confusion, felt to be due to combination of depression and associated dehydration due to poor intake,  and hyponatremia with narcotic withdrawal.  Assessment & Plan:   Principal Problem:   Hyponatremia Active Problems:   Essential hypertension   Emphysema lung (HCC)   Atrial flutter (HCC)   Depression due to physical illness   Cognitive retention disorder  #1 Altered Mental Status: Suspect toxicmetabolic encephalopathy Continue with supportive care for now Neurology has recommended voltage-gated potassium channel antibody- pending outpatient neuropsychological testing continue treatment of her underlying medical issues. Suspect related to peresistently low sodium.  [place] with medication adjustments noted  #2 Hyponatremia: - Suspected due to decreased oral intake/dehydration - Initially improved with IVF hydration, worse with IVF stopped - Trended down with IVF on 7/19, later improved after fluid restricting patient - With fluid restriction, Na trended down to 124 and IVF was resumed - Zyprexa stopped per Psych recommendations, changed to effexor - Has improved to 132 with IVF.   #3 HTN - Have added PRN hydralazine - Currently stable  #4 Emphysema - Stable at present - without wheezing   #5 A. Flutter - Rate controlled - Remains stable  DVT prophylaxis: Eliquis Code Status: Full Family Communication: Pt in room,  family at bedside Disposition Plan: Uncertain at this time  Consultants:   Neurology  Psychatry  Procedures:     Antimicrobials: Anti-infectives    None      Subjective: Eager to go home  Objective: Vitals:   10/08/16 1000 10/08/16 1740 10/08/16 2007 10/09/16 0910  BP: (!) 164/71 (!) 169/73 (!) 155/67 (!) 160/80  Pulse: 91 92 72 86  Resp: 20 19 18 18   Temp: 98 F (36.7 C) 97.9 F (36.6 C) 97.9 F (36.6 C) 98.7 F (37.1 C)  TempSrc: Oral Oral Oral Oral  SpO2: 96% 98% 98% 98%  Weight:      Height:        Intake/Output Summary (Last 24 hours) at 10/09/16 1439 Last data filed at 10/09/16 1216  Gross per 24 hour  Intake             3404 ml  Output             1650 ml  Net             1754 ml   Filed Weights   10/05/16 0545 10/05/16 2259 10/07/16 1900  Weight: 60.8 kg (134 lb) 61.7 kg (136 lb) 61.2 kg (135 lb)    Examination: General exam: Conversant, in no acute distress Respiratory system: normal chest rise, clear, no audible wheezing Cardiovascular system: regular rhythm, s1-s2 Gastrointestinal system: Nondistended, nontender, pos BS Central nervous system: No seizures, no tremors Extremities: No cyanosis, no joint deformities Skin: No rashes, no pallor Psychiatry: Affect normal // no auditory hallucinations   Data Reviewed: I have personally reviewed following labs and imaging studies  CBC:  Recent Labs Lab 10/02/16 1719 10/08/16 0408  WBC 11.0* 8.7  HGB 13.6 11.5*  HCT  37.5 34.3*  MCV 85.2 89.1  PLT 492* 935*   Basic Metabolic Panel:  Recent Labs Lab 10/05/16 0433 10/06/16 0527  10/07/16 0445  10/08/16 0408 10/08/16 1140 10/08/16 1538 10/08/16 1959 10/09/16 0318  NA 133* 127*  < > 129*  < > 129* 129* 129* 127* 132*  K 3.5 4.4  --  3.7  --  4.1  --   --   --  4.2  CL 96* 90*  --  91*  --  95*  --   --   --  100*  CO2 28 30  --  29  --  29  --   --   --  27  GLUCOSE 102* 108*  --  106*  --  108*  --   --   --  106*  BUN 8 7  --   8  --  11  --   --   --  11  CREATININE 0.76 0.86  --  0.80  --  0.79  --   --   --  0.85  CALCIUM 9.0 9.4  --  9.6  --  9.0  --   --   --  8.7*  < > = values in this interval not displayed. GFR: Estimated Creatinine Clearance: 55.2 mL/min (by C-G formula based on SCr of 0.85 mg/dL). Liver Function Tests:  Recent Labs Lab 10/02/16 1719  AST 21  ALT 16  ALKPHOS 94  BILITOT 0.5  PROT 6.6  ALBUMIN 3.9   No results for input(s): LIPASE, AMYLASE in the last 168 hours.  Recent Labs Lab 10/03/16 2104  AMMONIA 11   Coagulation Profile: No results for input(s): INR, PROTIME in the last 168 hours. Cardiac Enzymes: No results for input(s): CKTOTAL, CKMB, CKMBINDEX, TROPONINI in the last 168 hours. BNP (last 3 results) No results for input(s): PROBNP in the last 8760 hours. HbA1C: No results for input(s): HGBA1C in the last 72 hours. CBG: No results for input(s): GLUCAP in the last 168 hours. Lipid Profile: No results for input(s): CHOL, HDL, LDLCALC, TRIG, CHOLHDL, LDLDIRECT in the last 72 hours. Thyroid Function Tests: No results for input(s): TSH, T4TOTAL, FREET4, T3FREE, THYROIDAB in the last 72 hours. Anemia Panel: No results for input(s): VITAMINB12, FOLATE, FERRITIN, TIBC, IRON, RETICCTPCT in the last 72 hours. Sepsis Labs: No results for input(s): PROCALCITON, LATICACIDVEN in the last 168 hours.  No results found for this or any previous visit (from the past 240 hour(s)).   Radiology Studies: Dg Chest Port 1 View  Result Date: 10/07/2016 CLINICAL DATA:  Shortness of breath, COPD, smoker EXAM: PORTABLE CHEST 1 VIEW COMPARISON:  Portable exam at 1732 hrs compared to 10/02/2016 FINDINGS: Intraspinal stimulator lead projects over upper thoracic spine at the T2-T3 levels. Normal heart size, mediastinal contours, and pulmonary vascularity. Atherosclerotic calcification aorta. Minimal central peribronchial thickening with scarring at lateral lower RIGHT chest. Lungs mildly  hyperinflated but otherwise clear. No pleural effusion or pneumothorax. Bones demineralized. IMPRESSION: Minimal bronchitic changes and RIGHT basilar scarring. No acute abnormalities. Aortic Atherosclerosis (ICD10-I70.0). Electronically Signed   By: Lavonia Dana M.D.   On: 10/07/2016 17:54    Scheduled Meds: . apixaban  5 mg Oral BID  . feeding supplement (ENSURE ENLIVE)  237 mL Oral BID BM  . gabapentin  100 mg Oral BID  . levothyroxine  100 mcg Oral QAC breakfast  . lisinopril  5 mg Oral Daily  . nicotine  21 mg Transdermal Daily  . pantoprazole  40 mg Oral BID  . propranolol  80 mg Oral Daily  . venlafaxine XR  37.5 mg Oral Q breakfast   Continuous Infusions: . sodium chloride 10 mL/hr at 10/05/16 1143  . sodium chloride 75 mL/hr at 10/09/16 1017     LOS: 7 days   Tehilla Coffel, Orpah Melter, MD [facility] Pager [phone]  If 7PM-7AM, please contact night-coverage [email or web address] Password [facility] 10/09/2016, 2:39 PM

## 2016-10-09 NOTE — Progress Notes (Signed)
Physical Therapy Evaluation Patient Details Name: Destiny Hughes MRN: [id number] DOB: 1945-07-20 Today's Date: 10/09/2016   History of Present Illness  71 y/o female admitted from home with confusion, weakness, falls, toxic metabolic encephalopathy.  PMHx:  a-flutter, emphysema, COPD, lung collapse, HTN, chronic pain with R knee failed TKA x 3  Clinical Impression  Patients cognition remains her biggest deficits. She was able to ambulate with no loss of balance using a walker. She likely does not need a walker and would be able to walk with a cane but she is impulsive. She will require assitance at home to not be a fall risk. She continues to have some soreness in her right knee with gait. Acute therapy will continue to work with the patient.     Follow Up Recommendations Supervision for mobility/OOB;Home health PT    Equipment Recommendations  None recommended by PT    Recommendations for Other Services       Precautions / Restrictions Precautions Precautions: Fall Restrictions Weight Bearing Restrictions: No      Mobility  Bed Mobility Overal bed mobility: Needs Assistance Bed Mobility: Supine to Sit;Sit to Supine     Supine to sit: Supervision Sit to supine: Supervision   General bed mobility comments: cuing for safety with lines   Transfers Overall transfer level: Needs assistance Equipment used: Rolling walker (2 wheeled) Transfers: Sit to/from [facility] Sit to Stand: Supervision Stand pivot transfers: Supervision       General transfer comment: supervision with RW for safety. Likely could have used a cane. Reported minor syncope   Ambulation/Gait Ambulation/Gait assistance: Min guard Ambulation Distance (Feet): 100 Feet Assistive device: Rolling walker (2 wheeled) Gait Pattern/deviations: Step-through pattern;Decreased stride length;Wide base of support;Shuffle   Gait velocity interpretation: Below normal speed for age/gender General  Gait Details: cuing for generla safety and direction. Patient reported continued syncope but had no LOB  Stairs            Wheelchair Mobility    Modified Rankin (Stroke Patients Only)       Balance Overall balance assessment: History of Falls;Needs assistance Sitting-balance support: No upper extremity supported Sitting balance-Leahy Scale: Good     Standing balance support: Bilateral upper extremity supported Standing balance-Leahy Scale: Poor                               Pertinent Vitals/Pain Pain Assessment: Faces Faces Pain Scale: Hurts little more Pain Location: right knee with ambualtion  Pain Descriptors / Indicators: Aching;Grimacing Pain Intervention(s): Limited activity within patient's tolerance;Monitored during session;Repositioned    Home Living Family/patient expects to be discharged to:: Private residence Living Arrangements: Spouse/significant other Available Help at Discharge: Family;Available 24 hours/day Type of Home: House Home Access: Stairs to enter Entrance Stairs-Rails: Chemical engineer of Steps: 2 Home Layout: One level        Prior Function Level of Independence: Needs assistance   Gait / Transfers Assistance Needed: Per son, pt ambulated intermittently with SPC.  H/o falling   ADL's / Homemaking Assistance Needed: Per son, spouse assists pt with bathing dressing and IADLs.  They have to supervise all activities due to new onset of memory loss with pt not recalling what she has and has not done         Hand Dominance   Dominant Hand: Right    Extremity/Trunk Assessment   Upper Extremity Assessment Upper Extremity Assessment: Overall WFL for tasks  assessed    Lower Extremity Assessment Lower Extremity Assessment: Generalized weakness    Cervical / Trunk Assessment Cervical / Trunk Assessment: Normal  Communication   Communication: No difficulties  Cognition Arousal/Alertness:  Awake/alert Behavior During Therapy: Impulsive Overall Cognitive Status: History of cognitive impairments - at baseline                                 General Comments: Pt demonstrates significant memory deficits.  Unable to retain info asking same questions repeatedly. Needs frequent remianders for safety       General Comments      Exercises     Assessment/Plan    PT Assessment Patient needs continued PT services  PT Problem List Decreased strength;Decreased range of motion;Decreased activity tolerance;Decreased balance;Decreased mobility;Decreased coordination;Decreased cognition;Decreased knowledge of use of DME;Decreased safety awareness;Decreased skin integrity;Pain       PT Treatment Interventions      PT Goals (Current goals can be found in the Care Plan section)  Acute Rehab PT Goals Patient Stated Goal: Per son patient sitll confused. She recently took an anxiety medication. she is willing to work with therapy  PT Goal Formulation: With patient/family Time For Goal Achievement: 10/13/16 Potential to Achieve Goals: Good    Frequency Min 3X/week   Barriers to discharge Other (comment)      Co-evaluation               AM-PAC PT "6 Clicks" Daily Activity  Outcome Measure Difficulty turning over in bed (including adjusting bedclothes, sheets and blankets)?: A Little Difficulty moving from lying on back to sitting on the side of the bed? : A Little Difficulty sitting down on and standing up from a chair with arms (e.g., wheelchair, bedside commode, etc,.)?: Total Help needed moving to and from a bed to chair (including a wheelchair)?: A Little Help needed walking in hospital room?: A Little Help needed climbing 3-5 steps with a railing? : A Little 6 Click Score: 16    End of Session         PT Visit Diagnosis: Unsteadiness on feet (R26.81);History of falling (Z91.81);Pain Pain - Right/Left: Right Pain - part of body: Knee    Time:  1450-1506 PT Time Calculation (min) (ACUTE ONLY): 16 min   Charges:         PT G Codes:          Carney Living PT DPT  10/09/2016, 3:53 PM

## 2016-10-10 LAB — BASIC METABOLIC PANEL
Anion gap: 6 (ref 5–15)
BUN: 10 mg/dL (ref 6–20)
CO2: 30 mmol/L (ref 22–32)
Calcium: 9 mg/dL (ref 8.9–10.3)
Chloride: 96 mmol/L — ABNORMAL LOW (ref 101–111)
Creatinine, Ser: 0.79 mg/dL (ref 0.44–1.00)
GFR calc Af Amer: >60 mL/min (ref >60)
GLUCOSE: 97 mg/dL (ref 65–99)
POTASSIUM: 4 mmol/L (ref 3.5–5.1)
Sodium: 132 mmol/L — ABNORMAL LOW (ref 135–145)

## 2016-10-10 LAB — SODIUM
Sodium: 129 mmol/L — ABNORMAL LOW (ref 135–145)
Sodium: 127 mmol/L — ABNORMAL LOW (ref 135–145)
Sodium: 126 mmol/L — ABNORMAL LOW (ref 135–145)

## 2016-10-10 LAB — POTASSIUM: POTASSIUM: 4.5 mmol/L (ref 3.5–5.1)

## 2016-10-10 MED ORDER — SODIUM CHLORIDE 0.9 % IV SOLN
INTRAVENOUS | Status: DC
Start: 2016-10-10 — End: 2016-10-10

## 2016-10-10 MED ORDER — DONEPEZIL HCL 5 MG PO TABS
5.0000 mg | ORAL_TABLET | Freq: Every day | ORAL | Status: DC
  Administered 2016-10-10 – 2016-10-11 (×2): 5 mg via ORAL
  Filled 2016-10-10 (×2): qty 1

## 2016-10-10 NOTE — Care Management Note (Signed)
Case Management Note  Patient Details  Name: Destiny Hughes MRN: [id number] Date of Birth: 09-06-1945  Subjective/Objective:        CM following for progression and d/c planning. Noted Pt recommendations, Pt and husband plan to pt to d/c to home.             Action/Plan: 10/10/2016 Met with pt and husband, husband making HH decisions as pt remains sl confused. Pt has used [facility] previously and husband wishes to use them again. Pt has a walker and Mr Sinopoli is unsure of condition, he will notify this CM. 3:1 ordered. [place] notified of need for 3:1 and [place] services. May d/c this pt later today depending upon midday labs.   Expected Discharge Date:               Expected Discharge Plan:  [place]  In-House Referral:  [facility]  Discharge planning Services  CM Consult  Post Acute Care Choice:  Durable Medical Equipment, Home Health Choice offered to:  Spouse  DME Arranged:  3-N-1 DME Agency:  [place] Arranged:  RN, PT, Nurse's Aide [place] Agency:  [place]  Status of Service:  In process, will continue to follow  If discussed at Long Length of Stay Meetings, dates discussed:    Additional Comments:  Adron Bene, RN 10/10/2016, 10:34 AM

## 2016-10-10 NOTE — Progress Notes (Signed)
PROGRESS NOTE    Destiny Hughes  FTD:[id number] DOB: [birth year] DOA: 10/02/2016 PCP: Minette Brine    Brief Narrative:  71 y.o.femalewith medical history significant for but not limited to COPD, hypothyroidism, and chronic hyponatremia presenting with a three-week history of progressively worsening confusion with weakness forget fullness and falls associated with hyponatremia of 118,  from 139 on 09/05/2016.  Of note, she lost her sister in February 2018 and had been grieving with poor oral intake culminating with insidious onset of confusion, felt to be due to combination of depression and associated dehydration due to poor intake,  and hyponatremia with narcotic withdrawal.  Assessment & Plan:   Principal Problem:   Hyponatremia Active Problems:   Essential hypertension   Emphysema lung (HCC)   Atrial flutter (HCC)   Depression due to physical illness   Cognitive retention disorder  #1 Altered Mental Status: Suspect toxicmetabolic encephalopathy Continue with supportive care for now Neurology has recommended voltage-gated potassium channel antibody- pending outpatient neuropsychological testing continue treatment of her underlying medical issues. Suspect related to peresistently low sodium.  Comfort Psychiatry with medication adjustments noted Appears to be stable  #2 Hyponatremia: - Suspected due to decreased oral intake/dehydration - Initially improved with IVF hydration, worse with IVF stopped - Trended down with IVF on 7/19, later improved after fluid restricting patient - With fluid restriction, Na trended down to 124 and IVF was resumed - Zyprexa stopped per Psych recommendations, changed to effexor - Na remains stable at 132 with IVF. Held IVF and repeated sodium with follow up value down to 129. [facility]. Recommendations to continue fluid restriction. Will follow up on Nephrology recommendations  #3 HTN - Have added PRN hydralazine -  Remains stable  #4 Emphysema - Stable at present - Denies wheezing   #5 A. Flutter - Rate controlled - Currently stable  DVT prophylaxis: Eliquis Code Status: Full Family Communication: Pt in room, family at bedside Disposition Plan: Uncertain at this time  Consultants:   Neurology  Psychatry  Procedures:     Antimicrobials: Anti-infectives    None      Subjective: Without complaints  Objective: Vitals:   10/09/16 2055 10/10/16 0122 10/10/16 0411 10/10/16 0829  BP: (!) 178/81 (!) 136/59 132/66 (!) 154/87  Pulse: 75  77 85  Resp: 19  14 16   Temp: 98.6 F (37 C)  98 F (36.7 C) 98.2 F (36.8 C)  TempSrc: Oral  Oral Oral  SpO2: 97%  95% 95%  Weight: 61.2 kg (135 lb)     Height:        Intake/Output Summary (Last 24 hours) at 10/10/16 1422 Last data filed at 10/10/16 0606  Gross per 24 hour  Intake          2356.41 ml  Output              100 ml  Net          2256.41 ml   Filed Weights   10/05/16 2259 10/07/16 1900 10/09/16 2055  Weight: 61.7 kg (136 lb) 61.2 kg (135 lb) 61.2 kg (135 lb)    Examination: General exam: Awake, laying in bed, in nad Respiratory system: Normal respiratory effort, no wheezing Cardiovascular system: regular rate, s1, s2 Gastrointestinal system: Soft, nondistended, positive BS Central nervous system: CN2-12 grossly intact, strength intact Extremities: Perfused, no clubbing Skin: Normal skin turgor, no notable skin lesions seen Psychiatry: Mood normal // no visual hallucinations   Data Reviewed: I have personally reviewed  following labs and imaging studies  CBC:  Recent Labs Lab 10/08/16 0408  WBC 8.7  HGB 11.5*  HCT 34.3*  MCV 89.1  PLT 811*   Basic Metabolic Panel:  Recent Labs Lab 10/06/16 0527  10/07/16 0445  10/08/16 0408  10/08/16 1538 10/08/16 1959 10/09/16 0318 10/10/16 0506 10/10/16 1106  NA 127*  < > 129*  < > 129*  < > 129* 127* 132* 132* 129*  K 4.4  --  3.7  --  4.1  --   --   --  4.2  4.0  --   CL 90*  --  91*  --  95*  --   --   --  100* 96*  --   CO2 30  --  29  --  29  --   --   --  27 30  --   GLUCOSE 108*  --  106*  --  108*  --   --   --  106* 97  --   BUN 7  --  8  --  11  --   --   --  11 10  --   CREATININE 0.86  --  0.80  --  0.79  --   --   --  0.85 0.79  --   CALCIUM 9.4  --  9.6  --  9.0  --   --   --  8.7* 9.0  --   < > = values in this interval not displayed. GFR: Estimated Creatinine Clearance: 58.6 mL/min (by C-G formula based on SCr of 0.79 mg/dL). Liver Function Tests: No results for input(s): AST, ALT, ALKPHOS, BILITOT, PROT, ALBUMIN in the last 168 hours. No results for input(s): LIPASE, AMYLASE in the last 168 hours.  Recent Labs Lab 10/03/16 2104  AMMONIA 11   Coagulation Profile: No results for input(s): INR, PROTIME in the last 168 hours. Cardiac Enzymes: No results for input(s): CKTOTAL, CKMB, CKMBINDEX, TROPONINI in the last 168 hours. BNP (last 3 results) No results for input(s): PROBNP in the last 8760 hours. HbA1C: No results for input(s): HGBA1C in the last 72 hours. CBG: No results for input(s): GLUCAP in the last 168 hours. Lipid Profile: No results for input(s): CHOL, HDL, LDLCALC, TRIG, CHOLHDL, LDLDIRECT in the last 72 hours. Thyroid Function Tests: No results for input(s): TSH, T4TOTAL, FREET4, T3FREE, THYROIDAB in the last 72 hours. Anemia Panel: No results for input(s): VITAMINB12, FOLATE, FERRITIN, TIBC, IRON, RETICCTPCT in the last 72 hours. Sepsis Labs: No results for input(s): PROCALCITON, LATICACIDVEN in the last 168 hours.  No results found for this or any previous visit (from the past 240 hour(s)).   Radiology Studies: No results found.  Scheduled Meds: . apixaban  5 mg Oral BID  . donepezil  5 mg Oral QHS  . feeding supplement (ENSURE ENLIVE)  237 mL Oral BID BM  . gabapentin  100 mg Oral BID  . levothyroxine  100 mcg Oral QAC breakfast  . lisinopril  5 mg Oral Daily  . nicotine  21 mg Transdermal  Daily  . pantoprazole  40 mg Oral BID  . propranolol  80 mg Oral Daily  . venlafaxine XR  37.5 mg Oral Q breakfast   Continuous Infusions: . sodium chloride 10 mL/hr at 10/05/16 1143     LOS: 8 days   CHIU, Orpah Melter, MD [facility] Pager [phone]  If 7PM-7AM, please contact night-coverage [email or web address] Password [email or web address] 10/10/2016, 2:22 PM

## 2016-10-10 NOTE — Care Management Important Message (Signed)
Important Message  Patient Details  Name: Destiny Hughes MRN: [id number] Date of Birth: 16-Feb-1946   Medicare Important Message Given:  Yes    Raphael Espe [address] 10/10/2016, 1:04 PM

## 2016-10-10 NOTE — Consult Note (Addendum)
Kathlynn Grate Czerniak Admit Date: 10/02/2016 10/10/2016 Alphonzo Grieve Requesting Physician:  Wyline Copas  Reason for Consult:  Hyponatremia HPI:  Ms. Basil is a 71yo female with PMH of COPD, Afib, HTN, hypothyroidism, chronic hyponatremia presenting to [facility] with increasing confusion and found to have hyponatremia of 118 on admission (was 139 on 09/05/2016). Previous admission in April 2018 also for hyponatremia which improved with IVF (at that time had N/V, AKI and was hypotonic, urine osm 400, urine sodium <10). On current admission, serum osm low at 258, Urine osm 172 and urine sodium 19. She was started on IV NS which improved her sodium but when stopped she would drop again so nephrology was consulted to help in management.  Per chart review, patient has had depression and change in mental status since her sister's passing in March 2018 with poor PO intake. Family was not available today to discuss her intake prior to this admission.   Patient unable to state why she is in the hospital; she states she eats normally. She endorses some dizziness on sitting up and standing, mild headache and mild shortness of breath. Currently denies nausea, vomiting.  PMH Incudes:  Depression  A fib s/p ablation 5/18  COPD  Hypothyroidism   Creat (mg/dL)  Date Value  12/17/2015 0.87  09/01/2015 1.04 (H)  04/28/2015 0.89  05/21/2014 0.91  02/17/2014 1.02  10/29/2013 1.13 (H)  07/24/2013 1.11 (H)  04/15/2013 1.09   Creatinine, Ser (mg/dL)  Date Value  10/10/2016 0.79  10/09/2016 0.85  10/08/2016 0.79  10/07/2016 0.80  10/06/2016 0.86  10/05/2016 0.76  10/04/2016 0.85  10/03/2016 0.75  10/03/2016 0.79  10/02/2016 0.88   I/Os: -2.7L in addition to 9 unmeasured occurrences   ROS Balance of 12 systems is negative w/ exceptions as above  PMH  Past Medical History:  Diagnosis Date  . Chronic back pain   . Collapse of right lung   . COPD (chronic obstructive pulmonary disease) (HCC)    Hx. Bronchitis occ, 01-25-11 somes issue now taking  Z-pak-started 01-24-11/Scar tissue  present  from previous lung collapse  . Depression   . GERD (gastroesophageal reflux disease)    tx. TUMS  . Glaucoma   . Hyperlipemia   . Hypothyroidism    tx. Levothyroxine  . Nephrolithiasis   . Neuromuscular disorder ([place]) 01-25-11   Pain/nerve stimulator implanted -2 yrs ago.   PSH  Past Surgical History:  Procedure Laterality Date  . A-FLUTTER ABLATION N/A 08/16/2016   Procedure: A-Flutter Ablation;  Surgeon: Evans Lance, MD;  Location: [facility];  Service: Cardiovascular;  Laterality: N/A;  . APPENDECTOMY  01-25-11  . CARPAL TUNNEL RELEASE  01-25-11   Bil.  . CERVICAL SPINE SURGERY  01-25-11   2005-multiple levels  . CHOLECYSTECTOMY  01-25-11   '07  . COLONOSCOPY WITH PROPOFOL N/A 07/23/2012   Procedure: COLONOSCOPY WITH PROPOFOL;  Surgeon: Garlan Fair, MD;  Location: [facility];  Service: Endoscopy;  Laterality: N/A;  . EYE SURGERY    . JOINT REPLACEMENT  01-25-11   6'03 LTHA-hemi  . KNEE ARTHROPLASTY  01-25-11   '04-right, revised x2  . SPINAL CORD STIMULATOR IMPLANT  2009 APPROX   DR. ELSNER  . THORACIC SPINE SURGERY  01-25-11   6'02- then nerve stimulator implanted after  . TOTAL KNEE REVISION  02/01/2011   Procedure: TOTAL KNEE REVISION;  Surgeon: Mauri Pole;  Location: [facility];  Service: Orthopedics;  Laterality: Right;  . TUBAL LIGATION  FH  Family History  Problem Relation Age of Onset  . Heart attack Father   . Emphysema Father   . Aneurysm Mother        CEREBRAL  . Emphysema Mother   . Dementia Mother   . Diabetes Son   . Hypertension Son   . Hypertension Son   . Cancer Sister        Widely Metastatic  . Asthma Son        x2  . Breast cancer Paternal Aunt   . Rheum arthritis Maternal Aunt    SH  reports that she has been smoking Cigarettes.  She started smoking about 55 years ago. She has a 37.50 pack-year smoking history. She has never used  smokeless tobacco. She reports that she does not drink alcohol or use drugs. Allergies  Allergies  Allergen Reactions  . Albuterol Other (See Comments)    Had difficulty breathing after a nebulizer treatment  . Penicillins Anaphylaxis and Swelling    Has patient had a PCN reaction causing immediate rash, facial/tongue/throat swelling, SOB or lightheadedness with hypotension: Yes Has patient had a PCN reaction causing severe rash involving mucus membranes or skin necrosis: Rash Has patient had a PCN reaction that required hospitalization: No Has patient had a PCN reaction occurring within the last 10 years: No If all of the above answers are "NO", then may proceed with Cephalosporin use.  [name] [Kdc:Albuterol] Shortness Of Breath  . Sulfa Antibiotics Swelling    Throat swells, but no shortness of breath noted  . Zanaflex [Tizanidine] Other (See Comments)    Possible confusion (??)  . Codeine Nausea And Vomiting  . Ecotrin [Aspirin] Nausea And Vomiting   Home medications Prior to Admission medications   Medication Sig Start Date End Date Taking? Authorizing Provider  apixaban (ELIQUIS) 5 MG TABS tablet Take 1 tablet (5 mg total) by mouth 2 (two) times daily. 08/09/16  Yes Sherran Needs, NP  levalbuterol [facility] HFA) 45 MCG/ACT inhaler Inhale 2 puffs into the lungs every 4 (four) hours as needed for wheezing. 09/22/16  Yes Alfonzo Beers, MD  levothyroxine (SYNTHROID) 100 MCG tablet Take 1 tablet (100 mcg total) by mouth daily before breakfast. 09/26/16  Yes Evans Lance, MD  lisinopril (PRINIVIL,ZESTRIL) 5 MG tablet Take 1 tablet (5 mg total) by mouth daily. 08/31/16  Yes Skeet Latch, MD  magnesium oxide (MAG-OX) 400 MG tablet Take 1 tablet (400 mg total) by mouth daily. Patient taking differently: Take 400 mg by mouth daily as needed (nausea or reflux).  09/07/16  Yes Kilroy, Luke K, PA-C  pantoprazole (PROTONIX) 40 MG tablet Take 1 tablet (40 mg total) by mouth 2 (two) times  daily. 07/14/16  Yes Ghimire, Henreitta Leber, MD  propranolol (INDERAL) 80 MG tablet Take 80 mg by mouth daily. For seven days Started on 09-29-16   Yes [provider]  sertraline (ZOLOFT) 100 MG tablet Take 100 mg by mouth at bedtime.   Yes [provider]    Current Medications Scheduled Meds: . apixaban  5 mg Oral BID  . donepezil  5 mg Oral QHS  . feeding supplement (ENSURE ENLIVE)  237 mL Oral BID BM  . gabapentin  100 mg Oral BID  . levothyroxine  100 mcg Oral QAC breakfast  . lisinopril  5 mg Oral Daily  . nicotine  21 mg Transdermal Daily  . pantoprazole  40 mg Oral BID  . propranolol  80 mg Oral Daily  .  venlafaxine XR  37.5 mg Oral Q breakfast   Continuous Infusions: . sodium chloride 10 mL/hr at 10/05/16 1143   PRN Meds:.acetaminophen, hydrALAZINE, levalbuterol, LORazepam, magnesium oxide, ondansetron **OR** ondansetron (ZOFRAN) IV, zolpidem  CBC  Recent Labs Lab 10/08/16 0408  WBC 8.7  HGB 11.5*  HCT 34.3*  MCV 89.1  PLT 009*   Basic Metabolic Panel  Recent Labs Lab 10/04/16 0947 10/05/16 0433 10/06/16 0527  10/07/16 0445  10/08/16 0408 10/08/16 1140 10/08/16 1538 10/08/16 1959 10/09/16 0318 10/10/16 0506 10/10/16 1106  NA 129* 133* 127*  < > 129*  < > 129* 129* 129* 127* 132* 132* 129*  K 3.3* 3.5 4.4  --  3.7  --  4.1  --   --   --  4.2 4.0  --   CL 97* 96* 90*  --  91*  --  95*  --   --   --  100* 96*  --   CO2 22 28 30   --  29  --  29  --   --   --  27 30  --   GLUCOSE 166* 102* 108*  --  106*  --  108*  --   --   --  106* 97  --   BUN 5* 8 7  --  8  --  11  --   --   --  11 10  --   CREATININE 0.85 0.76 0.86  --  0.80  --  0.79  --   --   --  0.85 0.79  --   CALCIUM 8.6* 9.0 9.4  --  9.6  --  9.0  --   --   --  8.7* 9.0  --   < > = values in this interval not displayed.  Physical Exam  Blood pressure (!) 154/87, pulse 85, temperature 98.2 F (36.8 C), temperature source Oral, resp. rate 16, height 5\' 6"  (1.676 m), weight 135 lb  (61.2 kg), SpO2 95 %. GEN: NAD, lying comfortably in bed ENT: moist mucous membranes EYES: anicteric, EOMI CV: RRR, no murmurs, rubs or gallops appreciated, pulses intact, no LE edema PULM: diminished breath sounds throughout, minimal end-expiratory wheezing, no rales ABD: soft, +BS, nontender SKIN: no rashes; healing ecchymoses EXT: moves all extremities freely, strength intact  Assessment Ms. Tamblyn is a 71yo female with symptomatic hyponatremia with co-morbidities of hypothyroidism, MDD, A fib s/p ablation, COPD. 1. Symptomatic hypotonic hyponatremia - she has normal renal function, appears euvolemic on exam, has had good urine output. Her hypothyroidism seems to be well controlled; no other signs of adrenal insufficiency.  2. MDD - psych evaluated, changed from zoloft to venlafaxine due to hyponatremia 3. COPD - patient with some shortness of breath and minimal wheezing; per primary 4. Hypothyroidism - on levothyroxine, TSH wnl on admission  Plan 1. Fluid restriction, strict ins/outs 2. Follow Bmet, urine osm, urine sodium  Alphonzo Grieve, MD PGY - 2 10/10/2016, 2:55 PM  Agree appears that initial presentation was consistent with volume depletion with low sodium and low urine osmolarity   This improved with IV fluid administration.  Now with increase in osm and higher Na  It appears that an element of SIADH is present. Agree with fluid restriction  Consider lasix

## 2016-10-10 NOTE — Consult Note (Signed)
   [facility] CM Inpatient Consult   10/10/2016  Destiny Hughes 1945-07-31 [id number]   Follow up:  Spoke with the patient and husband.  Patient with periods of confusion.  Husband states he would like a wheelchair to assist him in transporting his wife due to his severe back issues.  Spoke with inpatient RNCM, Malachy Mood regarding the patient's husband request for DME at discharge.  Noted psychiatry note from 10/08/16.  Ongoing [facility] Care Management involvement endorsed by the husband for post hospital follow up.  Will update [place].  For questions, please contact:  Natividad Brood, RN BSN [facility] Liaison  [phone] business mobile phone Toll free office [phone]

## 2016-10-10 NOTE — Progress Notes (Signed)
Physical Therapy Treatment Patient Details Name: Destiny Hughes MRN: [id number] DOB: October 19, 1945 Today's Date: 10/10/2016    History of Present Illness 71 y/o female admitted from home with confusion, weakness, falls, toxic metabolic encephalopathy.  PMHx:  a-flutter, emphysema, COPD, lung collapse, HTN, chronic pain with R knee failed TKA x 3    PT Comments    Pt is up to walk with symptoms of dizziness and by the time she returned to bed was getting ringing in her ears.  Took measures of O2 sat and pulse but could benefit from orthostatics next visit.  Pt is with husband who is still planning to take her home, which will require assistance for all gait and balance.  He is already assisting her to BR in hosp so is aware of her limitations.  Will follow acutely for progression of balance and strengthening and will expect her to make further progress if BP is stable next visit.   Follow Up Recommendations  Supervision for mobility/OOB;Home health PT     Equipment Recommendations  None recommended by PT    Recommendations for Other Services       Precautions / Restrictions Precautions Precautions: Fall Restrictions Weight Bearing Restrictions: No    Mobility  Bed Mobility Overal bed mobility: Needs Assistance Bed Mobility: Supine to Sit;Sit to Supine     Supine to sit: Min guard Sit to supine: Min guard   General bed mobility comments: using bed rails to get up  Transfers Overall transfer level: Needs assistance Equipment used: 1 person hand held assist Transfers: Sit to/from Stand Sit to Stand: Min guard         General transfer comment: Pt is demonstrating some indifference for instructions from PT and reminded her not to get up unsuccessfully  Ambulation/Gait Ambulation/Gait assistance: Min guard;Min assist Ambulation Distance (Feet): 40 Feet Assistive device: 1 person hand held assist Gait Pattern/deviations: Step-through pattern;Wide base of support;Trunk  flexed;Decreased stride length Gait velocity: reduced Gait velocity interpretation: Below normal speed for age/gender General Gait Details: dizziness with a walk to BR then worse with standing up from commode.  used [facility] as pt was in a hurry to get there due to fear of incontinence   Stairs            Wheelchair Mobility    Modified Rankin (Stroke Patients Only)       Balance Overall balance assessment: Needs assistance;History of Falls Sitting-balance support: Feet supported Sitting balance-Leahy Scale: Good     Standing balance support: Bilateral upper extremity supported Standing balance-Leahy Scale: Fair Standing balance comment: less than fair dynamic standing balance                            Cognition Arousal/Alertness: Lethargic;Awake/alert Behavior During Therapy: Impulsive Overall Cognitive Status: History of cognitive impairments - at baseline                                 General Comments: reminders for not standing alone which pt attempts again      Exercises General Exercises - Lower Extremity Ankle Circles/Pumps: AROM;Both;5 reps Quad Sets: AROM;Both;10 reps Heel Slides: AROM;Both;10 reps Hip ABduction/ADduction: AROM;Both;10 reps    General Comments        Pertinent Vitals/Pain Pain Assessment: No/denies pain    Home Living  Prior Function            PT Goals (current goals can now be found in the care plan section) Acute Rehab PT Goals Patient Stated Goal: to feel better Potential to Achieve Goals: Good Progress towards PT goals: Progressing toward goals    Frequency    Min 3X/week      PT Plan Current plan remains appropriate    Co-evaluation              AM-PAC PT "6 Clicks" Daily Activity  Outcome Measure  Difficulty turning over in bed (including adjusting bedclothes, sheets and blankets)?: A Little Difficulty moving from lying on back to sitting on the  side of the bed? : A Little Difficulty sitting down on and standing up from a chair with arms (e.g., wheelchair, bedside commode, etc,.)?: A Little Help needed moving to and from a bed to chair (including a wheelchair)?: A Little Help needed walking in hospital room?: A Little Help needed climbing 3-5 steps with a railing? : A Lot 6 Click Score: 17    End of Session Equipment Utilized During Treatment: Gait belt Activity Tolerance: Treatment limited secondary to medical complications (Comment) (dizziness with no drop in O2 sat and pulses in 70's) Patient left: in bed;with call bell/phone within reach;with family/visitor present Nurse Communication: Mobility status (informed nurse about the dizziness and then ringing in ears) PT Visit Diagnosis: Unsteadiness on feet (R26.81);History of falling (Z91.81);Pain     Time: 1132-1201 PT Time Calculation (min) (ACUTE ONLY): 29 min  Charges:  $Gait Training: 8-22 mins $Therapeutic Exercise: 8-22 mins                    G Codes:  Functional Assessment Tool Used: AM-PAC 6 Clicks Basic Mobility    Ramond Dial 10/10/2016, 1:50 PM   Mee Hives, PT MS Acute Rehab Dept. Number: [place] and [place]

## 2016-10-11 LAB — BASIC METABOLIC PANEL
Anion gap: 8 (ref 5–15)
BUN: 10 mg/dL (ref 6–20)
CALCIUM: 9.4 mg/dL (ref 8.9–10.3)
CHLORIDE: 90 mmol/L — AB (ref 101–111)
CO2: 29 mmol/L (ref 22–32)
CREATININE: 0.85 mg/dL (ref 0.44–1.00)
GFR calc non Af Amer: >60 mL/min (ref >60)
Glucose, Bld: 101 mg/dL — ABNORMAL HIGH (ref 65–99)
Potassium: 4.3 mmol/L (ref 3.5–5.1)
SODIUM: 127 mmol/L — AB (ref 135–145)

## 2016-10-11 LAB — SODIUM
SODIUM: 130 mmol/L — AB (ref 135–145)
Sodium: 126 mmol/L — ABNORMAL LOW (ref 135–145)

## 2016-10-11 LAB — CORTISOL: Cortisol, Plasma: 12.3 ug/dL

## 2016-10-11 NOTE — Progress Notes (Signed)
Occupational Therapy Treatment Patient Details Name: Destiny Hughes MRN: [id number] DOB: 03/12/1946 Today's Date: 10/11/2016    History of present illness 71 y/o female admitted from home with confusion, weakness, falls, toxic metabolic encephalopathy.  PMHx:  a-flutter, emphysema, COPD, lung collapse, HTN, chronic pain with R knee failed TKA x 3   OT comments  This 71 yo female admitted with above presents to acute OT not wanting to work on getting bathed, but was agreeable to get up and work with me. She is at an overall min guard A-S level and will have this at home from husband as well as she has a shower seat to sit on due to weakness and dizziness when standing. Husband is very aware he needs to be with her if she is up on her feet. Pt with O2 in nose, but not on when entered and O2 was 97% on RA; worked off of O2 the whole session and sats never below 95%--pt did however c/o of SOB intermittently. Pt c/o dizziness when she first sat up so I took BP and it was 145/80 and HR 77--I let her dizziness subside and then I had her stand and she then too c/o of increased dizziness with BP 130/72 and HR 93. Pt sat back down until dizziness subsided then we went for a short walk (20 feet) with increased c/o dizziness with BP 152/79 and HR 98.  Follow Up Recommendations  Home health OT;Supervision/Assistance - 24 hour    Equipment Recommendations  None recommended by OT       Precautions / Restrictions Precautions Precautions: Fall Restrictions Weight Bearing Restrictions: No       Mobility Bed Mobility Overal bed mobility: Modified Independent             General bed mobility comments: using bed rails to get up  Transfers Overall transfer level: Needs assistance Equipment used: Rolling walker (2 wheeled) Transfers: Sit to/from Stand Sit to Stand: Min guard              Balance Overall balance assessment: Needs assistance Sitting-balance support: No upper extremity  supported;Feet supported Sitting balance-Leahy Scale: Good     Standing balance support: No upper extremity supported;During functional activity Standing balance-Leahy Scale: Fair Standing balance comment: standing at sink to brush hair                           ADL either performed or assessed with clinical judgement   ADL Overall ADL's : Needs assistance/impaired                                       General ADL Comments: Pt with poor STM and needs cues for multi-step activities. She continues at [employer] A -S level for basic ADLs due to balance and memory and also at this time c/o dizziness when she stands up. Husband is very aware that he needs to be with her anytime she is up on her feet     Vision Patient Visual Report: No change from baseline            Cognition Arousal/Alertness: Awake/alert Behavior During Therapy: Impulsive Overall Cognitive Status: History of cognitive impairments - at baseline  Pertinent Vitals/ Pain       Pain Assessment: Faces Faces Pain Scale: Hurts even more Pain Location: left chest--intermittently; decribes it more laterally towards arm pit Pain Descriptors / Indicators: Grimacing Pain Intervention(s): Limited activity within patient's tolerance;Monitored during session;Repositioned         Frequency  Min 2X/week           Plan Discharge plan remains appropriate       AM-PAC PT "6 Clicks" Daily Activity     Outcome Measure   Help from another person eating meals?: None Help from another person taking care of personal grooming?: A Little Help from another person toileting, which includes using toliet, bedpan, or urinal?: A Little Help from another person bathing (including washing, rinsing, drying)?: A Little Help from another person to put on and taking off regular upper body clothing?: A Little Help from another person to  put on and taking off regular lower body clothing?: A Little 6 Click Score: 19    End of Session Equipment Utilized During Treatment: Gait belt  OT Visit Diagnosis: Unsteadiness on feet (R26.81);Cognitive communication deficit (R41.841)   Activity Tolerance Other (comment) (limited by dizziness)   Patient Left in chair;with call bell/phone within reach;with family/visitor present (husband in room and he reports he will be with her so no need for chair alarm)   Nurse Communication  (Pt c/o of left chest pain and charge RN in to assess. [name] primary RN pt asking about her morning meds and having SOB but sats no lower than 95% on RA)        Time: 6283-1517 OT Time Calculation (min): 33 min  Charges: OT General Charges $OT Visit: 1 Procedure OT Treatments $Self Care/Home Management : 23-37 mins  [place], OTR/L [phone] 10/11/2016

## 2016-10-11 NOTE — Progress Notes (Signed)
PROGRESS NOTE    Destiny Hughes  LGX:[id number] DOB: 03-08-1946 DOA: 10/02/2016 PCP: Minette Brine    Brief Narrative:  71 y.o.femalewith medical history significant for but not limited to COPD, hypothyroidism, and chronic hyponatremia presenting with a three-week history of progressively worsening confusion with weakness forget fullness and falls associated with hyponatremia of 118,  from 139 on 09/05/2016.  Of note, she lost her sister in February 2018 and had been grieving with poor oral intake culminating with insidious onset of confusion, felt to be due to combination of depression and associated dehydration due to poor intake,  and hyponatremia with narcotic withdrawal.  Assessment & Plan:   Principal Problem:   Hyponatremia Active Problems:   Essential hypertension   Emphysema lung (HCC)   Atrial flutter (HCC)   Depression due to physical illness   Cognitive retention disorder  #1 Altered Mental Status: Suspect toxicmetabolic encephalopathy Continue with supportive care for now Neurology has recommended voltage-gated potassium channel antibody- pending outpatient neuropsychological testing continue treatment of her underlying medical issues. Suspect related to peresistently low sodium.  Consulted Psychiatry with medication adjustments noted Remains confused, however seems to be chronic. Question underlying primary psychiatric d/o  #2 Hyponatremia: - Suspected due to decreased oral intake/dehydration - Initially improved with IVF hydration, worse with IVF stopped - Trended down with IVF on 7/19, later improved after fluid restricting patient - With fluid restriction, Na trended down to 124 and IVF was resumed - Zyprexa stopped per Psych recommendations, changed to effexor - Na remains stable at 132 with IVF. Held IVF and repeated sodium with follow up value down to 129. [facility] Nephrology. Recommendations to continue fluid restriction.  - sodium peaked to  130, now 126  #3 HTN - Have added PRN hydralazine - Currently stable  #4 Emphysema - Stable at present - on minimal o2, no wheezing on exam  #5 A. Flutter - Rate controlled - remains stable  DVT prophylaxis: Eliquis Code Status: Full Family Communication: Pt in room, family at bedside Disposition Plan: Uncertain at this time  Consultants:   Neurology  Psychatry  Procedures:     Antimicrobials: Anti-infectives    None      Subjective: No complaints at this time  Objective: Vitals:   10/10/16 2138 10/11/16 0437 10/11/16 0907 10/11/16 1711  BP: (!) 175/81 (!) 144/79 (!) 159/77 (!) 149/71  Pulse: 76 76 71 70  Resp: 16 15 16 16   Temp: 98.6 F (37 C) 97.9 F (36.6 C) 98.2 F (36.8 C) 98.6 F (37 C)  TempSrc:   Oral Oral  SpO2: 96% 97% 97% 99%  Weight: 65.8 kg (145 lb)     Height:        Intake/Output Summary (Last 24 hours) at 10/11/16 1845 Last data filed at 10/11/16 1821  Gross per 24 hour  Intake             1160 ml  Output             1700 ml  Net             -540 ml   Filed Weights   10/07/16 1900 10/09/16 2055 10/10/16 2138  Weight: 61.2 kg (135 lb) 61.2 kg (135 lb) 65.8 kg (145 lb)    Examination: General exam: Conversant, in no acute distress Respiratory system: normal chest rise, clear, no audible wheezing Cardiovascular system: regular rhythm, s1-s2 Gastrointestinal system: Nondistended, nontender, pos BS Central nervous system: No seizures, no tremors Extremities: No cyanosis,  no joint deformities Skin: No rashes, no pallor Psychiatry: Affect normal // no auditory hallucinations   Data Reviewed: I have personally reviewed following labs and imaging studies  CBC:  Recent Labs Lab 10/08/16 0408  WBC 8.7  HGB 11.5*  HCT 34.3*  MCV 89.1  PLT 956*   Basic Metabolic Panel:  Recent Labs Lab 10/07/16 0445  10/08/16 0408  10/09/16 0318 10/10/16 0506  10/10/16 1640 10/10/16 1850 10/10/16 2211 10/11/16 0433 10/11/16 0943  10/11/16 1558  NA 129*  < > 129*  < > 132* 132*  < > 127*  --  126* 127* 130* 126*  K 3.7  --  4.1  --  4.2 4.0  --   --  4.5  --  4.3  --   --   CL 91*  --  95*  --  100* 96*  --   --   --   --  90*  --   --   CO2 29  --  29  --  27 30  --   --   --   --  29  --   --   GLUCOSE 106*  --  108*  --  106* 97  --   --   --   --  101*  --   --   BUN 8  --  11  --  11 10  --   --   --   --  10  --   --   CREATININE 0.80  --  0.79  --  0.85 0.79  --   --   --   --  0.85  --   --   CALCIUM 9.6  --  9.0  --  8.7* 9.0  --   --   --   --  9.4  --   --   < > = values in this interval not displayed. GFR: Estimated Creatinine Clearance: 55.2 mL/min (by C-G formula based on SCr of 0.85 mg/dL). Liver Function Tests: No results for input(s): AST, ALT, ALKPHOS, BILITOT, PROT, ALBUMIN in the last 168 hours. No results for input(s): LIPASE, AMYLASE in the last 168 hours. No results for input(s): AMMONIA in the last 168 hours. Coagulation Profile: No results for input(s): INR, PROTIME in the last 168 hours. Cardiac Enzymes: No results for input(s): CKTOTAL, CKMB, CKMBINDEX, TROPONINI in the last 168 hours. BNP (last 3 results) No results for input(s): PROBNP in the last 8760 hours. HbA1C: No results for input(s): HGBA1C in the last 72 hours. CBG: No results for input(s): GLUCAP in the last 168 hours. Lipid Profile: No results for input(s): CHOL, HDL, LDLCALC, TRIG, CHOLHDL, LDLDIRECT in the last 72 hours. Thyroid Function Tests: No results for input(s): TSH, T4TOTAL, FREET4, T3FREE, THYROIDAB in the last 72 hours. Anemia Panel: No results for input(s): VITAMINB12, FOLATE, FERRITIN, TIBC, IRON, RETICCTPCT in the last 72 hours. Sepsis Labs: No results for input(s): PROCALCITON, LATICACIDVEN in the last 168 hours.  No results found for this or any previous visit (from the past 240 hour(s)).   Radiology Studies: No results found.  Scheduled Meds: . apixaban  5 mg Oral BID  . donepezil  5 mg Oral  QHS  . feeding supplement (ENSURE ENLIVE)  237 mL Oral BID BM  . gabapentin  100 mg Oral BID  . levothyroxine  100 mcg Oral QAC breakfast  . lisinopril  5 mg Oral Daily  . nicotine  21 mg Transdermal Daily  .  pantoprazole  40 mg Oral BID  . propranolol  80 mg Oral Daily  . venlafaxine XR  37.5 mg Oral Q breakfast   Continuous Infusions: . sodium chloride 10 mL/hr at 10/05/16 1143     LOS: 9 days   CHIU, Orpah Melter, MD [facility] Pager [phone]  If 7PM-7AM, please contact night-coverage [email or web address] Password [facility] 10/11/2016, 6:45 PM

## 2016-10-11 NOTE — Progress Notes (Signed)
Admit: 10/02/2016 LOS: 9  Destiny Hughes is a 71yo female with symptomatic hyponatremia with co-morbidities of hypothyroidism, MDD, A fib s/p ablation, COPD. Nephrology was consulted for hyponatremia management.  Subjective:  Patient with complaints of intermittent chest pain and some shortness of breath. Husband in room and states that patient has been eating 3 meals a day.    07/23 0701 - 07/24 0700 In: 840 [P.O.:840] Out: 1050 [Urine:1050]  Filed Weights   10/07/16 1900 10/09/16 2055 10/10/16 2138  Weight: 135 lb (61.2 kg) 135 lb (61.2 kg) 145 lb (65.8 kg)    Scheduled Meds: . apixaban  5 mg Oral BID  . donepezil  5 mg Oral QHS  . feeding supplement (ENSURE ENLIVE)  237 mL Oral BID BM  . gabapentin  100 mg Oral BID  . levothyroxine  100 mcg Oral QAC breakfast  . lisinopril  5 mg Oral Daily  . nicotine  21 mg Transdermal Daily  . pantoprazole  40 mg Oral BID  . propranolol  80 mg Oral Daily  . venlafaxine XR  37.5 mg Oral Q breakfast   Continuous Infusions: . sodium chloride 10 mL/hr at 10/05/16 1143   PRN Meds:.acetaminophen, hydrALAZINE, levalbuterol, LORazepam, magnesium oxide, ondansetron **OR** ondansetron (ZOFRAN) IV, zolpidem  Current Labs: reviewed Na 127>130, K 4.3, cortisol 12.3   Physical Exam:  Blood pressure (!) 159/77, pulse 71, temperature 98.2 F (36.8 C), temperature source Oral, resp. rate 16, height 5\' 6"  (1.676 m), weight 145 lb (65.8 kg), SpO2 97 %. Constitutional: NAD, pleasant CV: RRR, no murmurs, rubs or gallops appreciated, no LE edema Resp: CTAB, no increased work of breathing Abd: Soft, NDNT Ext: moves all extremities freely Skin: no rashes  A 1. Symptomatic hypotonic hyponatremia - initially trended down on fluid restriction but is improving again to 130 on repeat this morning.  2. MDD - psych evaluated, changed from zoloft to venlafaxine due to hyponatremia 3. COPD - per primary 4. Hypothyroidism - on levothyroxine, TSH wnl on  admission  P 1. Continue fluid restriction, and strict in/outs 2. Follow Bmet, Na. Urine osm and urine sodium not collected yet, will f/u.  Alphonzo Grieve, MD PGY - 2 10/11/2016, 12:54 PM   Recent Labs Lab 10/09/16 3419 10/10/16 0506  10/10/16 1850 10/10/16 2211 10/11/16 0433 10/11/16 0943  NA 132* 132*  < >  --  126* 127* 130*  K 4.2 4.0  --  4.5  --  4.3  --   CL 100* 96*  --   --   --  90*  --   CO2 27 30  --   --   --  29  --   GLUCOSE 106* 97  --   --   --  101*  --   BUN 11 10  --   --   --  10  --   CREATININE 0.85 0.79  --   --   --  0.85  --   CALCIUM 8.7* 9.0  --   --   --  9.4  --   < > = values in this interval not displayed.  Recent Labs Lab 10/08/16 0408  WBC 8.7  HGB 11.5*  HCT 34.3*  MCV 89.1  PLT 424*

## 2016-10-12 DIAGNOSIS — R4189 Other symptoms and signs involving cognitive functions and awareness: Secondary | ICD-10-CM

## 2016-10-12 DIAGNOSIS — Z634 Disappearance and death of family member: Secondary | ICD-10-CM

## 2016-10-12 DIAGNOSIS — F419 Anxiety disorder, unspecified: Secondary | ICD-10-CM

## 2016-10-12 DIAGNOSIS — G4700 Insomnia, unspecified: Secondary | ICD-10-CM

## 2016-10-12 DIAGNOSIS — E871 Hypo-osmolality and hyponatremia: Principal | ICD-10-CM

## 2016-10-12 DIAGNOSIS — F332 Major depressive disorder, recurrent severe without psychotic features: Secondary | ICD-10-CM

## 2016-10-12 DIAGNOSIS — J438 Other emphysema: Secondary | ICD-10-CM

## 2016-10-12 DIAGNOSIS — I483 Typical atrial flutter: Secondary | ICD-10-CM

## 2016-10-12 DIAGNOSIS — I10 Essential (primary) hypertension: Secondary | ICD-10-CM

## 2016-10-12 DIAGNOSIS — R410 Disorientation, unspecified: Secondary | ICD-10-CM

## 2016-10-12 DIAGNOSIS — F0631 Mood disorder due to known physiological condition with depressive features: Secondary | ICD-10-CM

## 2016-10-12 LAB — BASIC METABOLIC PANEL
ANION GAP: 8 (ref 5–15)
BUN: 15 mg/dL (ref 6–20)
CO2: 29 mmol/L (ref 22–32)
Calcium: 9.4 mg/dL (ref 8.9–10.3)
Chloride: 90 mmol/L — ABNORMAL LOW (ref 101–111)
Creatinine, Ser: 0.96 mg/dL (ref 0.44–1.00)
GFR calc Af Amer: >60 mL/min (ref >60)
GFR calc non Af Amer: 57 mL/min — ABNORMAL LOW (ref >60)
GLUCOSE: 111 mg/dL — AB (ref 65–99)
POTASSIUM: 4.6 mmol/L (ref 3.5–5.1)
Sodium: 127 mmol/L — ABNORMAL LOW (ref 135–145)

## 2016-10-12 LAB — CBC
HEMATOCRIT: 34.2 % — AB (ref 36.0–46.0)
HEMOGLOBIN: 11.9 g/dL — AB (ref 12.0–15.0)
MCH: 30.4 pg (ref 26.0–34.0)
MCHC: 34.8 g/dL (ref 30.0–36.0)
MCV: 87.5 fL (ref 78.0–100.0)
Platelets: 386 10*3/uL (ref 150–400)
RBC: 3.91 MIL/uL (ref 3.87–5.11)
RDW: 13.3 % (ref 11.5–15.5)
WBC: 9.1 10*3/uL (ref 4.0–10.5)

## 2016-10-12 LAB — SODIUM
Sodium: 123 mmol/L — ABNORMAL LOW (ref 135–145)
Sodium: 128 mmol/L — ABNORMAL LOW (ref 135–145)

## 2016-10-12 LAB — OSMOLALITY, URINE: Osmolality, Ur: 222 mOsm/kg — ABNORMAL LOW (ref 300–900)

## 2016-10-12 LAB — SODIUM, URINE, RANDOM: Sodium, Ur: 49 mmol/L

## 2016-10-12 MED ORDER — DONEPEZIL HCL 5 MG PO TABS: 5.0000 mg | ORAL_TABLET | Freq: Every day | ORAL | 0 refills | Status: DC

## 2016-10-12 MED ORDER — GABAPENTIN 100 MG PO CAPS: 100.0000 mg | ORAL_CAPSULE | Freq: Two times a day (BID) | ORAL | 0 refills | Status: DC

## 2016-10-12 MED ORDER — NICOTINE 21 MG/24HR TD PT24: 21.0000 mg | MEDICATED_PATCH | Freq: Every day | TRANSDERMAL | 0 refills | Status: DC

## 2016-10-12 MED ORDER — ZOLPIDEM TARTRATE 5 MG PO TABS: 5.0000 mg | ORAL_TABLET | Freq: Every evening | ORAL | 0 refills | Status: DC | PRN

## 2016-10-12 MED ORDER — VENLAFAXINE HCL ER 37.5 MG PO CP24: 37.5000 mg | ORAL_CAPSULE | Freq: Every day | ORAL | 0 refills | Status: DC

## 2016-10-12 MED ORDER — ENSURE ENLIVE PO LIQD: 237.0000 mL | Freq: Two times a day (BID) | ORAL | 0 refills | Status: DC

## 2016-10-12 NOTE — Progress Notes (Addendum)
Spoke w [facility], patient followed pta, was active w [facility], however may be candidate for "Home First" program through [facility] in conjunction w [name] for additional [facility] services. [place] liaison discussing w Market researcher.  Addendum: Patient not candidate for program per medical director, [facility] will follow after DC in conjunction w HH.

## 2016-10-12 NOTE — Discharge Summary (Addendum)
Physician Discharge Summary  Destiny Hughes KPT:[id number] DOB: April 18, 1945 DOA: 10/02/2016  PCP: Minette Brine  Admit date: 10/02/2016 Discharge date: 10/12/2016  Time spent: 45 minutes  Recommendations for Outpatient Follow-up:  Patient will be discharged to home with home health services, nursing, physical and occupational therapy, aide, and social work.  Patient will need to follow up with primary care provider within one week of discharge, repeat BMP.  Follow up with psychiatrist and neurologist in 1-2 weeks. Patient should continue medications as prescribed.  Patient should follow a heart healthy diet with 1255ml fluid restriction per day.  Discharge Diagnoses:  Acute encephalopathy, metabolic Hyponatremia Essential hypertension Emphysema Atrial flutter Hypothyroidism Physical deconditioning Insomnia  Discharge Condition: Stable  Diet recommendation: heart healthy diet with 1264ml fluid restriction per day  [facility] Weights   10/07/16 1900 10/09/16 Jun 29, 2053 10/10/16 June 29, 2136  Weight: 61.2 kg (135 lb) 61.2 kg (135 lb) 65.8 kg (145 lb)    History of present illness:  On 10/02/16 by Dr. Jennette Kettle Destiny Hughes is a 70 y.o. female with medical history significant of COPD, hypothyroidism, depression following death of sister back in 06-30-2022, hyponatremia chronically, admitted in April this year for sodium 121 which was believed to be due to poor PO intake and corrected with NS during admission.  CT chest on 4/22 was negative for any obvious malignancy.  UA at that time demonstrated urine sodium <10 and urine osm in the 400s. Patient brought in by family for confusion, weakness, falls, and forgetfullness.  While she has ongoing chronic depression these symptoms have been worse especially over the past 3 days they state.  Symptoms are persistent, severe, worsening.  Hospital Course:  Acute encephalopathy, metabolic -Appears to be improving, however, patient has poor memory  retention  -Neurology consulted and appreciated, recommended voltage-gated potassium Channel antibody, pending outpatient neuropsychological testing. Continue treatment of underlying medical issues -thought to be related to low sodium, however sodium has improved- however memory still impaired -Suspect underlying dementia -Psychiatry consulted and appreciated, continued Zoloft and made medication adjustments, and added Aricept -Discussed with son at bedside, patient has had memory issues for a few months. -Recommended patient be followed by her psychiatrist as well as outpatient physician on a more frequent basis for Mini-Mental status exams and for possible formal diagnosis of dementia -Will discharge patient with home health RN, Aide, social work to follow patient  Hyponatremia -Suspected to be secondary to increased volume intake -Initially improved with IV hydration. However trended downward with IV fluids. Improved with fluid restriction initially however sodium then trended back down to 124. -As noted above, psychiatry did make medication adjustments -Sodium currently 128 -Nephrology consulted and appreciated. -Discussed with Dr. Justin Mend. He suspects psychogenic polydipsia and excess fluid intake. Feels patient can be discharged with current sodium level and have close follow-up and monitoring with her primary care physician. Further recommendations at this time.  Essential hypertension -Continue lisinopril, propanolol   Emphysema -Appears to be stable, no wheezing on exam  Atrial flutter -appears to be rate controlled -Continue Eliquis  Hypothyroidism -Continue synthroid  Physical deconditioning -PT and OT consulted and recommended HH  Insomnia -Will discharge with ambien   Procedures: None  Consultations: Nephrology Psychiatry  Neurology   Discharge Exam: Vitals:   10/12/16 0503 10/12/16 0939  BP: 130/66 (!) 127/95  Pulse: (!) 58 86  Resp: 16 17  Temp: 97.9 F  (36.6 C) 98 F (36.7 C)     General: Well developed, well nourished, NAD, appears stated  age  39: NCAT, mucous membranes moist.  Cardiovascular: S1 S2 auscultated, no rubs, murmurs or gallops. Regular rate and rhythm.  Respiratory: Clear to auscultation bilaterally with equal chest rise  Abdomen: Soft, nontender, nondistended, + bowel sounds  Extremities: warm dry without cyanosis clubbing or edema  Neuro: AAOx2 (self, place), Nonfocal, memory impairment,  Psych: Appropriate and pleasant  Discharge Instructions Discharge Instructions    Discharge instructions    Complete by:  As directed    Patient will be discharged to home with home health services, nursing, physical and occupational therapy, aide, and social work.  Patient will need to follow up with primary care provider within one week of discharge, repeat BMP.  Follow up with psychiatrist and neurologist in 1-2 weeks. Patient should continue medications as prescribed.  Patient should follow a heart healthy diet with 1275ml fluid restriction per day.     Current Discharge Medication List    START taking these medications   Details  donepezil (ARICEPT) 5 MG tablet Take 1 tablet (5 mg total) by mouth at bedtime. Qty: 30 tablet, Refills: 0    feeding supplement, ENSURE ENLIVE, (ENSURE ENLIVE) LIQD Take 237 mLs by mouth 2 (two) times daily between meals. Qty: 60 Bottle, Refills: 0    gabapentin (NEURONTIN) 100 MG capsule Take 1 capsule (100 mg total) by mouth 2 (two) times daily. Qty: 60 capsule, Refills: 0    nicotine (NICODERM CQ - DOSED IN MG/24 HOURS) 21 mg/24hr patch Place 1 patch (21 mg total) onto the skin daily. Qty: 28 patch, Refills: 0    venlafaxine XR (EFFEXOR-XR) 37.5 MG 24 hr capsule Take 1 capsule (37.5 mg total) by mouth daily with breakfast. Qty: 30 capsule, Refills: 0    zolpidem (AMBIEN) 5 MG tablet Take 1 tablet (5 mg total) by mouth at bedtime as needed for sleep. Qty: 30 tablet, Refills: 0       CONTINUE these medications which have NOT CHANGED   Details  apixaban (ELIQUIS) 5 MG TABS tablet Take 1 tablet (5 mg total) by mouth 2 (two) times daily. Qty: 60 tablet, Refills: 6    levalbuterol (XOPENEX HFA) 45 MCG/ACT inhaler Inhale 2 puffs into the lungs every 4 (four) hours as needed for wheezing. Qty: 1 Inhaler, Refills: 12    levothyroxine (SYNTHROID) 100 MCG tablet Take 1 tablet (100 mcg total) by mouth daily before breakfast. Qty: 90 tablet, Refills: 3    lisinopril (PRINIVIL,ZESTRIL) 5 MG tablet Take 1 tablet (5 mg total) by mouth daily. Qty: 30 tablet, Refills: 11    magnesium oxide (MAG-OX) 400 MG tablet Take 1 tablet (400 mg total) by mouth daily. Qty: 30 tablet, Refills: 3    pantoprazole (PROTONIX) 40 MG tablet Take 1 tablet (40 mg total) by mouth 2 (two) times daily. Qty: 60 tablet, Refills: 0    propranolol (INDERAL) 80 MG tablet Take 80 mg by mouth daily. For seven days Started on 09-29-16      STOP taking these medications     sertraline (ZOLOFT) 100 MG tablet      acetaminophen (TYLENOL) 325 MG tablet        Allergies  Allergen Reactions  . Albuterol Other (See Comments)    Had difficulty breathing after a nebulizer treatment  . Penicillins Anaphylaxis and Swelling    Has patient had a PCN reaction causing immediate rash, facial/tongue/throat swelling, SOB or lightheadedness with hypotension: Yes Has patient had a PCN reaction causing severe rash involving mucus membranes or skin necrosis:  Rash Has patient had a PCN reaction that required hospitalization: No Has patient had a PCN reaction occurring within the last 10 years: No If all of the above answers are "NO", then may proceed with Cephalosporin use.  [name] [Kdc:Albuterol] Shortness Of Breath  . Sulfa Antibiotics Swelling    Throat swells, but no shortness of breath noted  . Zanaflex [Tizanidine] Other (See Comments)    Possible confusion (??)  . Codeine Nausea And Vomiting  . Ecotrin  [Aspirin] Nausea And Vomiting   Follow-up Information    Minette Brine. Schedule an appointment as soon as possible for a visit in 1 week(s).   Specialty:  General Practice Why:  Hospital follow up, repeat BMP Contact information: [address] [place] [address] [phone]            The results of significant diagnostics from this hospitalization (including imaging, microbiology, ancillary and laboratory) are listed below for reference.    Significant Diagnostic Studies: Dg Chest 2 View  Result Date: 10/02/2016 CLINICAL DATA:  Hyponatremia. Dizziness and confusion. Shortness of breath. EXAM: CHEST  2 VIEW COMPARISON:  Two-view chest x-ray 09/22/2016. FINDINGS: The heart size is normal. The lungs are clear. Chronic scarring at the right lung base is stable. There is no edema or effusion. No focal airspace disease is present. Upper thoracic kyphosis is stable. IMPRESSION: 1. No acute cardiopulmonary disease or significant interval change. Electronically Signed   By: San Morelle M.D.   On: 10/02/2016 20:41   Dg Chest 2 View  Result Date: 09/22/2016 CLINICAL DATA:  Tachycardia for 1 week. Chest pain of unknown duration. EXAM: CHEST  2 VIEW COMPARISON:  Single-view of the chest 08/15/2016. PA and lateral chest 08/03/2016. FINDINGS: The lungs are clear. Heart size is normal. No pneumothorax or pleural effusion. Mild pleural scarring along the periphery of the right lung base is unchanged. No acute bony abnormality. The patient is status post cervical fusion. Spinal stimulator is in place. IMPRESSION: No acute disease. Electronically Signed   By: Inge Rise M.D.   On: 09/22/2016 10:39   Ct Head W & Wo Contrast  Result Date: 10/04/2016 CLINICAL DATA:  Increasing confusion for 3 weeks, falls and forgetfulness. History of hypertension, hyperlipidemia, atrial fibrillation. EXAM: CT HEAD WITHOUT AND WITH CONTRAST TECHNIQUE: Contiguous axial images were obtained  from the base of the skull through the vertex without and with intravenous contrast CONTRAST:  66mL ISOVUE-300 IOPAMIDOL (ISOVUE-300) INJECTION 61% COMPARISON:  CT HEAD July 08, 2016 FINDINGS: BRAIN: No intraparenchymal hemorrhage, mass effect nor midline shift. The ventricles and sulci are normal for age. RIGHT frontal developmental venous anomaly. Patchy supratentorial white matter hypodensities less than expected for patient's age, though non-specific are most compatible with chronic small vessel ischemic disease. No acute large vascular territory infarcts. No abnormal extra-axial fluid collections. Basal cisterns are patent. No suspicious intracranial enhancement. VASCULAR: Mild calcific atherosclerosis of the carotid siphons. SKULL: No skull fracture. No significant scalp soft tissue swelling. SINUSES/ORBITS: The mastoid air-cells and included paranasal sinuses are well-aerated.The included ocular globes and orbital contents are non-suspicious. OTHER: None. IMPRESSION: Negative CT HEAD with and without contrast for age. Electronically Signed   By: Elon Alas M.D.   On: 10/04/2016 00:40   Dg Chest Port 1 View  Result Date: 10/07/2016 CLINICAL DATA:  Shortness of breath, COPD, smoker EXAM: PORTABLE CHEST 1 VIEW COMPARISON:  Portable exam at 1732 hrs compared to 10/02/2016 FINDINGS: Intraspinal stimulator lead projects over upper thoracic spine  at the T2-T3 levels. Normal heart size, mediastinal contours, and pulmonary vascularity. Atherosclerotic calcification aorta. Minimal central peribronchial thickening with scarring at lateral lower RIGHT chest. Lungs mildly hyperinflated but otherwise clear. No pleural effusion or pneumothorax. Bones demineralized. IMPRESSION: Minimal bronchitic changes and RIGHT basilar scarring. No acute abnormalities. Aortic Atherosclerosis (ICD10-I70.0). Electronically Signed   By: Lavonia Dana M.D.   On: 10/07/2016 17:54    Microbiology: No results found for this or any  previous visit (from the past 240 hour(s)).   Labs: Basic Metabolic Panel:  Recent Labs Lab 10/08/16 0408  10/09/16 2542 10/10/16 0506  10/10/16 1850  10/11/16 0433 10/11/16 0943 10/11/16 1558 10/11/16 2324 10/12/16 0337 10/12/16 1031  NA 129*  < > 132* 132*  < >  --   < > 127* 130* 126* 123* 127* 128*  K 4.1  --  4.2 4.0  --  4.5  --  4.3  --   --   --  4.6  --   CL 95*  --  100* 96*  --   --   --  90*  --   --   --  90*  --   CO2 29  --  27 30  --   --   --  29  --   --   --  29  --   GLUCOSE 108*  --  106* 97  --   --   --  101*  --   --   --  111*  --   BUN 11  --  11 10  --   --   --  10  --   --   --  15  --   CREATININE 0.79  --  0.85 0.79  --   --   --  0.85  --   --   --  0.96  --   CALCIUM 9.0  --  8.7* 9.0  --   --   --  9.4  --   --   --  9.4  --   < > = values in this interval not displayed. Liver Function Tests: No results for input(s): AST, ALT, ALKPHOS, BILITOT, PROT, ALBUMIN in the last 168 hours. No results for input(s): LIPASE, AMYLASE in the last 168 hours. No results for input(s): AMMONIA in the last 168 hours. CBC:  Recent Labs Lab 10/08/16 0408 10/12/16 0337  WBC 8.7 9.1  HGB 11.5* 11.9*  HCT 34.3* 34.2*  MCV 89.1 87.5  PLT 424* 386   Cardiac Enzymes: No results for input(s): CKTOTAL, CKMB, CKMBINDEX, TROPONINI in the last 168 hours. BNP: BNP (last 3 results)  Recent Labs  07/01/16 1830  BNP 173.1*    ProBNP (last 3 results) No results for input(s): PROBNP in the last 8760 hours.  CBG: No results for input(s): GLUCAP in the last 168 hours.     SignedCristal Ford  [facility] 10/12/2016, 3:54 PM

## 2016-10-12 NOTE — Discharge Instructions (Signed)
Confusion Confusion is the inability to think with your usual speed or clarity. Confusion may come on quickly or slowly over time. How quickly the confusion comes on depends on the cause. Confusion can be due to any number of causes. What are the causes?  Concussion, head injury, or head trauma.  Seizures.  Stroke.  Fever.  Brain tumor.  Age related decreased brain function (dementia).  Heightened emotional states like rage or terror.  Mental illness in which the person loses the ability to determine what is real and what is not (hallucinations).  Infections such as a urinary tract infection (UTI).  Toxic effects from alcohol, drugs, or prescription medicines.  Dehydration and an imbalance of salts in the body (electrolytes).  Lack of sleep.  Low blood sugar (diabetes).  Low levels of oxygen from conditions such as chronic lung disorders.  Drug interactions or other medicine side effects.  Nutritional deficiencies, especially niacin, thiamine, vitamin C, or vitamin B.  Sudden drop in body temperature (hypothermia).  Change in routine, such as when traveling or hospitalized. What are the signs or symptoms? People often describe their thinking as cloudy or unclear when they are confused. Confusion can also include feeling disoriented. That means you are unaware of where or who you are. You may also not know what the date or time is. If confused, you may also have difficulty paying attention, remembering, and making decisions. Some people also act aggressively when they are confused. How is this diagnosed? The medical evaluation of confusion may include:  Blood and urine tests.  X-rays.  Brain and nervous system tests.  Analyzing your brain waves (electroencephalogram or EEG).  Magnetic resonance imaging (MRI) of your head.  Computed tomography (CT) scan of your head.  Mental status tests in which your health care provider may ask many questions. Some of these  questions may seem silly or strange, but they are a very important test to help diagnose and treat confusion.  How is this treated? An admission to the hospital may not be needed, but a person with confusion should not be left alone. Stay with a family member or friend until the confusion clears. Avoid alcohol, pain relievers, or sedative drugs until you have fully recovered. Do not drive until directed by your health care provider. Follow these instructions at home: What family and friends can do:  To find out if someone is confused, ask the person to state his or her name, age, and the date. If the person is unsure or answers incorrectly, he or she is confused.  Always introduce yourself, no matter how well the person knows you.  Often remind the person of his or her location.  Place a calendar and clock near the confused person.  Help the person with his or her medicines. You may want to use a pill box, an alarm as a reminder, or give the person each dose as prescribed.  Talk about current events and plans for the day.  Try to keep the environment calm, quiet, and peaceful.  Make sure the person keeps follow-up visits with his or her health care provider.  How is this prevented? Ways to prevent confusion:  Avoid alcohol.  Eat a balanced diet.  Get enough sleep.  Take medicine only as directed by your health care provider.  Do not become isolated. Spend time with other people and make plans for your days.  Keep careful watch on your blood sugar levels if you are diabetic.  Get help right  right away if: °· You develop severe headaches, repeated vomiting, seizures, blackouts, or slurred speech. °· There is increasing confusion, weakness, numbness, restlessness, or personality changes. °· You develop a loss of balance, have marked dizziness, feel uncoordinated, or fall. °· You have delusions, hallucinations, or develop severe anxiety. °· Your family members think you need to be  rechecked. °This information is not intended to replace advice given to you by your health care provider. Make sure you discuss any questions you have with your health care provider. °Document Released: 04/14/2004 Document Revised: 09/25/2015 Document Reviewed: 04/12/2013 °Elsevier Interactive Patient Education © 2018 [employer] °Hyponatremia °Hyponatremia is when the amount of salt (sodium) in your blood is too low. When salt levels are low, your cells absorb extra water and they swell. The swelling happens throughout the body, but it mostly affects the brain. °Follow these instructions at home: °· Take medicines only as told by your doctor. Many medicines can make this condition worse. Talk with your doctor about any medicines that you are currently taking. °· Carefully follow a recommended diet as told by your doctor. °· Carefully follow instructions from your doctor about fluid restrictions. °· Keep all follow-up visits as told by your doctor. This is important. °· Do not drink alcohol. °Contact a doctor if: °· You feel sicker to your stomach (nauseous). °· You feel more confused. °· You feel more tired (fatigued). °· Your headache gets worse. °· You feel weaker. °· Your symptoms go away and then they come back. °· You have trouble following the diet instructions. °Get help right away if: °· You start to twitch and shake (have a seizure). °· You pass out (faint). °· You keep having watery poop (diarrhea). °· You keep throwing up (vomiting). °This information is not intended to replace advice given to you by your health care provider. Make sure you discuss any questions you have with your health care provider. °Document Released: 11/17/2010 Document Revised: 08/13/2015 Document Reviewed: 03/03/2014 °Elsevier Interactive Patient Education © 2018 [employer] Inc[employer] ° °

## 2016-10-12 NOTE — Progress Notes (Signed)
Physical Therapy Treatment Patient Details Name: Destiny Hughes MRN: [id number] DOB: 16-Jul-1945 Today's Date: 10/12/2016    History of Present Illness 71 y/o female admitted from home with confusion, weakness, falls, toxic metabolic encephalopathy.  PMHx:  a-flutter, emphysema, COPD, lung collapse, HTN, chronic pain with R knee failed TKA x 3    PT Comments    Pt is up to walk with PT and asked to get to BR x 2 with poor recall of safety instructions.  Pt is pretty much at her best level cognitively since admission, and not sure that this is baseline.  Will follow acutely as needed for strength and balance with RW, as pt is much more stable with the AD.   Follow Up Recommendations  Supervision for mobility/OOB;Home health PT     Equipment Recommendations  None recommended by PT    Recommendations for Other Services       Precautions / Restrictions Precautions Precautions: Fall Restrictions Weight Bearing Restrictions: No Other Position/Activity Restrictions: has bed mats on her floor    Mobility  Bed Mobility Overal bed mobility: Modified Independent Bed Mobility: Supine to Sit     Supine to sit: Supervision        Transfers Overall transfer level: Needs assistance Equipment used: Rolling walker (2 wheeled);1 person hand held assist Transfers: Sit to/from Stand Sit to Stand: Min guard         General transfer comment: following instructions better today, x in BR with ignoring of PT   Ambulation/Gait Ambulation/Gait assistance: Min assist Ambulation Distance (Feet): 120 Feet Assistive device: Rolling walker (2 wheeled);1 person hand held assist Gait Pattern/deviations: Step-through pattern;Wide base of support;Trunk flexed;Decreased stride length Gait velocity: reduced Gait velocity interpretation: Below normal speed for age/gender General Gait Details: pt was not unsteady with complaints about dizziness, but rather affected by mm weakness   Stairs             Wheelchair Mobility    Modified Rankin (Stroke Patients Only)       Balance     Sitting balance-Leahy Scale: Good       Standing balance-Leahy Scale: Fair Standing balance comment: standing at sink to wash hands                            Cognition Arousal/Alertness: Awake/alert Behavior During Therapy: Impulsive Overall Cognitive Status: History of cognitive impairments - at baseline                                 General Comments: pt stands alone consistently in the BR      Exercises      General Comments General comments (skin integrity, edema, etc.): Pt is more steady on RW but HHA reveals her instability on the LLE      Pertinent Vitals/Pain Pain Assessment: No/denies pain    Home Living                      Prior Function            PT Goals (current goals can now be found in the care plan section) Acute Rehab PT Goals Patient Stated Goal: to get stronger Progress towards PT goals: Progressing toward goals    Frequency    Min 3X/week      PT Plan Current plan remains appropriate    Co-evaluation  AM-PAC PT "6 Clicks" Daily Activity  Outcome Measure  Difficulty turning over in bed (including adjusting bedclothes, sheets and blankets)?: A Little Difficulty moving from lying on back to sitting on the side of the bed? : A Little Difficulty sitting down on and standing up from a chair with arms (e.g., wheelchair, bedside commode, etc,.)?: A Little Help needed moving to and from a bed to chair (including a wheelchair)?: A Little Help needed walking in hospital room?: A Little Help needed climbing 3-5 steps with a railing? : A Little 6 Click Score: 18    End of Session Equipment Utilized During Treatment: Gait belt Activity Tolerance: Patient limited by fatigue Patient left: in chair;with call bell/phone within reach;with family/visitor present Nurse Communication: Mobility  status PT Visit Diagnosis: Unsteadiness on feet (R26.81);History of falling (Z91.81);Pain     Time: 1638-4665 PT Time Calculation (min) (ACUTE ONLY): 26 min  Charges:  $Gait Training: 8-22 mins $Therapeutic Activity: 8-22 mins                    G Codes:  Functional Assessment Tool Used: AM-PAC 6 Clicks Basic Mobility    Ramond Dial 10/12/2016, 2:36 PM   Mee Hives, PT MS Acute Rehab Dept. Number: [place]

## 2016-10-12 NOTE — Progress Notes (Signed)
Admit: 10/02/2016 LOS: 10  [name] is a 71yo female with symptomatic hyponatremia with co-morbidities of hypothyroidism, MDD, A fib s/p ablation, COPD. Nephrology was consulted for hyponatremia management.  Subjective:  Patient with no complaints this morning. She is eating breakfast and has not had any nausea or vomiting. In speaking with son, he states that at home, patient is always provided with drinks and has them very readily available to keep her hydrated.     07/24 0701 - 07/25 0700 In: 1040 [P.O.:1040] Out: 1150 [Urine:1150]  Filed Weights   10/07/16 1900 10/09/16 2055 10/10/16 2138  Weight: 135 lb (61.2 kg) 135 lb (61.2 kg) 145 lb (65.8 kg)    Scheduled Meds: . apixaban  5 mg Oral BID  . donepezil  5 mg Oral QHS  . feeding supplement (ENSURE ENLIVE)  237 mL Oral BID BM  . gabapentin  100 mg Oral BID  . levothyroxine  100 mcg Oral QAC breakfast  . lisinopril  5 mg Oral Daily  . nicotine  21 mg Transdermal Daily  . pantoprazole  40 mg Oral BID  . propranolol  80 mg Oral Daily  . venlafaxine XR  37.5 mg Oral Q breakfast   Continuous Infusions: . sodium chloride 10 mL/hr at 10/05/16 1143   PRN Meds:.acetaminophen, hydrALAZINE, levalbuterol, LORazepam, magnesium oxide, ondansetron **OR** ondansetron (ZOFRAN) IV, zolpidem  Current Labs: reviewed Na 128, K 4.6   Physical Exam:  Blood pressure (!) 127/95, pulse 86, temperature 98 F (36.7 C), temperature source Oral, resp. rate 17, height 5\' 6"  (1.676 m), weight 145 lb (65.8 kg), SpO2 96 %. Constitutional: NAD, pleasant CV: RRR, no murmurs, rubs or gallops appreciated, no LE edema Resp: CTAB, no increased work of breathing Abd: Soft, NDNT Ext: moves all extremities freely Skin: no rashes  A 1. Symptomatic hypotonic hyponatremia - Overall stable with fluid restriction. Likely 2/2 poor solute intake compared to volume.  2. MDD - psych evaluated, changed from zoloft to venlafaxine due to hyponatremia 3. COPD -  per primary 4. Hypothyroidism - on levothyroxine, TSH wnl on admission  P 1. Continue fluid restriction, and strict in/outs with encouragement of solute intake. 2. Follow Bmets - expect gradual improvement in sodium 3. Nephrology will sign off; please call if further questions or concerns arise  Alphonzo Grieve, MD PGY - 2 10/12/2016, 11:36 AM   Recent Labs Lab 10/10/16 0506  10/10/16 1850  10/11/16 0433  10/11/16 2324 10/12/16 0337 10/12/16 1031  NA 132*  < >  --   < > 127*  < > 123* 127* 128*  K 4.0  --  4.5  --  4.3  --   --  4.6  --   CL 96*  --   --   --  90*  --   --  90*  --   CO2 30  --   --   --  29  --   --  29  --   GLUCOSE 97  --   --   --  101*  --   --  111*  --   BUN 10  --   --   --  10  --   --  15  --   CREATININE 0.79  --   --   --  0.85  --   --  0.96  --   CALCIUM 9.0  --   --   --  9.4  --   --  9.4  --   < > =  values in this interval not displayed.  Recent Labs Lab 10/08/16 0408 10/12/16 0337  WBC 8.7 9.1  HGB 11.5* 11.9*  HCT 34.3* 34.2*  MCV 89.1 87.5  PLT 424* 386
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
## 2016-10-12 NOTE — Care Management Note (Signed)
Case Management Note  Patient Details  Name: JOSALYN DETTMANN MRN: [id number] Date of Birth: 17-Aug-1945  Subjective/Objective:                 Patient will DC to home with full [facility] services through [facility]. Patient followed by [facility], notified Marthenia Rolling RN CM [facility] that patient will DC to home today. Patient will be picked up by son later this evening. If transportation issues arise, please call after hours (ED) Case Management.    Action/Plan:   Expected Discharge Date:  10/12/16               Expected Discharge Plan:  [place]  In-House Referral:  [facility]  Discharge planning Services  CM Consult  Post Acute Care Choice:  Durable Medical Equipment, Home Health Choice offered to:  Spouse  DME Arranged:  3-N-1 DME Agency:  [place]:  RN, PT, Nurse's Aide, Social Work [employer] Agency:  [place]  Status of Service:  Completed, signed off  If discussed at [employer], dates discussed:    Additional Comments:  Carles Collet, RN 10/12/2016, 3:56 PM

## 2016-10-12 NOTE — Consult Note (Signed)
   [facility] Inpatient Consult   10/12/2016  Destiny Hughes [birth year] [id number]     [facility] Care Management follow up.   Screened for potential Home 1st program. Discussed case with inpatient CM Medical Director prior to contacting Home First liaison. MD Director recommends that Home First program not appropriate at this time.   Will refer back to [facility] team for follow up once discharged from the hospital.   Marthenia Rolling, [place], RN,BSN [facility] Liaison [phone]

## 2016-10-13 NOTE — Progress Notes (Signed)
Patient was discharged to home on 7/25, AVS reviewed, prescriptions sent to patients pharmacy, IV removed, patient left floor via wheelchair with family members and staff

## 2016-10-14 ENCOUNTER — Other Ambulatory Visit: Payer: Self-pay

## 2016-10-15 NOTE — Patient Outreach (Signed)
[facility]) Care Management  10/14/2016   Destiny Hughes 1946/02/18 [id number]  Subjective:  This RNCM spoke with patient's husband who has been authorized to speak on her behalf. Husband reports his wife is repeating the same question over and over. Husband reports he does not want patient admitted for respite or long term care.  Objective:  Husband reports exhaustion but is not willing to explore options available to his wife.  Current Medications:  Current Outpatient Prescriptions  Medication Sig Dispense Refill  . apixaban (ELIQUIS) 5 MG TABS tablet Take 1 tablet (5 mg total) by mouth 2 (two) times daily. 60 tablet 6  . donepezil (ARICEPT) 5 MG tablet Take 1 tablet (5 mg total) by mouth at bedtime. 30 tablet 0  . feeding supplement, ENSURE ENLIVE, (ENSURE ENLIVE) LIQD Take 237 mLs by mouth 2 (two) times daily between meals. 60 Bottle 0  . gabapentin (NEURONTIN) 100 MG capsule Take 1 capsule (100 mg total) by mouth 2 (two) times daily. 60 capsule 0  . levalbuterol (XOPENEX HFA) 45 MCG/ACT inhaler Inhale 2 puffs into the lungs every 4 (four) hours as needed for wheezing. 1 Inhaler 12  . levothyroxine (SYNTHROID) 100 MCG tablet Take 1 tablet (100 mcg total) by mouth daily before breakfast. 90 tablet 3  . lisinopril (PRINIVIL,ZESTRIL) 5 MG tablet Take 1 tablet (5 mg total) by mouth daily. 30 tablet 11  . magnesium oxide (MAG-OX) 400 MG tablet Take 1 tablet (400 mg total) by mouth daily. (Patient taking differently: Take 400 mg by mouth daily as needed (nausea or reflux). ) 30 tablet 3  . nicotine (NICODERM CQ - DOSED IN MG/24 HOURS) 21 mg/24hr patch Place 1 patch (21 mg total) onto the skin daily. 28 patch 0  . pantoprazole (PROTONIX) 40 MG tablet Take 1 tablet (40 mg total) by mouth 2 (two) times daily. 60 tablet 0  . propranolol (INDERAL) 80 MG tablet Take 80 mg by mouth daily. For seven days Started on 09-29-16    . venlafaxine XR (EFFEXOR-XR) 37.5 MG 24 hr  capsule Take 1 capsule (37.5 mg total) by mouth daily with breakfast. 30 capsule 0  . zolpidem (AMBIEN) 5 MG tablet Take 1 tablet (5 mg total) by mouth at bedtime as needed for sleep. 30 tablet 0   No current facility-administered medications for this visit.     Functional Status:  In your present state of health, do you have any difficulty performing the following activities: 10/02/2016 08/15/2016  Hearing? N -  Vision? N -  Difficulty concentrating or making decisions? Y -  Walking or climbing stairs? Y -  Dressing or bathing? Y -  Doing errands, shopping? Y Y  Preparing Food and eating ? - -  Using the Toilet? - -  In the past six months, have you accidently leaked urine? - -  Do you have problems with loss of bowel control? - -  Managing your Medications? - -  Managing your Finances? - -  Housekeeping or managing your Housekeeping? - -  Some encounter information is confidential and restricted. Go to Review Flowsheets activity to see all data.  Some recent data might be hidden    Fall/Depression Screening: Fall Risk  07/26/2016 07/15/2016 04/28/2015  Falls in the past year? Yes - No  Number falls in past yr: 2 or more - -  Injury with Fall? Yes - -  Risk Factor Category  High Fall Risk - -  Risk for fall due to :  History of fall(s);Impaired balance/gait;Impaired mobility;Impaired vision Impaired balance/gait;Impaired mobility;Impaired vision;Medication side effect -  Follow up Falls prevention discussed - -   PHQ 2/9 Scores 07/26/2016 07/15/2016 04/28/2015 10/03/2014 05/21/2014  PHQ - 2 Score 0 0 0 2 0  PHQ- 9 Score - - - 6 -   THN CM Care Plan Problem One     Most Recent Value  Care Plan Problem One  Patient recently discharged from the hospital  Role Documenting the Problem One  Care Management [place] for Problem One  Active  THN Long Term Goal   Patient will have no acute care admissions in the next 31 days.  THN Long Term Goal Start Date  10/14/16  Interventions  for Problem One Long Term Goal  7/27 Transition of care call made for transtion of care assessment  THN CM Short Term Goal #1   in the next 28 days, patient will be in attendance to her medical appointments 100% of the time.  THN CM Short Term Goal #1 Start Date  10/07/16    [facility] CM Care Plan Problem Two     Most Recent Value  Care Plan Problem Two  patient and caregiver will meet with [facility] pharmacist for [facility] education in the next 28 days   Role Documenting the Problem Two  Care Management Coordinator    [facility] CM Care Plan Problem Three     Most Recent Value  Care Plan Problem Three  Risk of hospital readmission related to recent hospitalization May 28-30, 2018 for A-flutter with RVR  Role Documenting the Problem Three  Care Management [place] for Problem Three  Active  THN Long Term Goal   Patient will not experience hospital readmission within the next 31 days, as evidenced by patient/ caregiver reporting and review of EMR during [facility] RN CM outreach  [facility] Long Term Goal Start Date  10/14/16  Interventions for Problem Three Long Term Goal  7/27     Assessment:  Patient had recent hospitalization. Patient's husband and primary caregiver reports frustration with patient repeated questions. Husband expresses having back problems and need more rest. Patient's husband and son (works nights Friday through Tuesday and lives nearby) are her primary caregivers. Patient's husband has asked for a wheelchair several times, this RNCM advised him advanced home care does not deliver her would have to go pickup from store on [address]. This RNCM discussed an additional option with patient, husband and son where they could purchase one from [place] or a medical supply store.  This RNCM advised patient's husband to discuss options with their home care adviser, [place].  Plan:  Request SW referral for assessment of support system, need for intervention. Home visit in the next  21 days for community care coordination

## 2016-10-17 ENCOUNTER — Other Ambulatory Visit: Payer: Self-pay

## 2016-10-17 ENCOUNTER — Telehealth: Payer: Self-pay | Admitting: Diagnostic Neuroimaging

## 2016-10-17 NOTE — Telephone Encounter (Signed)
I called husband back and he has been talking to her pcp, to get referral to psychiatry and he appreciated call back and will touch base with them again.

## 2016-10-17 NOTE — Telephone Encounter (Signed)
Patient's son is calling. The patient was discharged from [facility] on 10-12-16. Patient's son would like patient to be referred to a psychiatrist

## 2016-10-17 NOTE — Telephone Encounter (Signed)
Called and no answer.  Will try again later.

## 2016-10-17 NOTE — Patient Outreach (Signed)
   This RNCM received call from patient's husband [name] stated the purpose of the call was to find out when [place] would be coming out. [name] stated he had not received a call from the agency which has been ordered to provide home health RN/PT/OT.  This RNCM made call to agency, spoke with Estill Bamberg who made several telephone calls to get information as to when services would be started. Estill Bamberg advised this RNCM she would call patient and her husband to advise them the start of care is tomorrow. This RNCM requested [facility] SW services also due to caregiver fatigue, [name] states his back is in severe pain and he needs rest.  This RNCM discussed respite care with [name], again, discussed the process of respite placement with [name] stated he does not want the word nursing home said in front of patient, all agreed to use the word rehabilitation instead.

## 2016-10-17 NOTE — Telephone Encounter (Signed)
Pt husband returned the call to [facility], he is asking to be called back

## 2016-10-17 NOTE — Telephone Encounter (Signed)
Ask PCP for referral. -VRP

## 2016-10-18 ENCOUNTER — Other Ambulatory Visit: Payer: Self-pay

## 2016-10-19 ENCOUNTER — Encounter (HOSPITAL_COMMUNITY): Payer: Self-pay | Admitting: Emergency Medicine

## 2016-10-19 ENCOUNTER — Emergency Department (HOSPITAL_COMMUNITY)
Admission: EM | Admit: 2016-10-19 | Discharge: 2016-10-20 | Disposition: A | Discharge status: Other Acute Inpatient Hospital | Payer: Medicare Other | Attending: Emergency Medicine | Admitting: Emergency Medicine

## 2016-10-19 ENCOUNTER — Emergency Department (HOSPITAL_COMMUNITY): Payer: Medicare Other

## 2016-10-19 DIAGNOSIS — F323 Major depressive disorder, single episode, severe with psychotic features: Secondary | ICD-10-CM | POA: Diagnosis present

## 2016-10-19 DIAGNOSIS — E039 Hypothyroidism, unspecified: Secondary | ICD-10-CM | POA: Diagnosis not present

## 2016-10-19 DIAGNOSIS — F0391 Unspecified dementia with behavioral disturbance: Secondary | ICD-10-CM | POA: Diagnosis not present

## 2016-10-19 DIAGNOSIS — F319 Bipolar disorder, unspecified: Secondary | ICD-10-CM | POA: Diagnosis not present

## 2016-10-19 DIAGNOSIS — I5031 Acute diastolic (congestive) heart failure: Secondary | ICD-10-CM | POA: Diagnosis not present

## 2016-10-19 DIAGNOSIS — F03918 Unspecified dementia, unspecified severity, with other behavioral disturbance: Secondary | ICD-10-CM | POA: Diagnosis present

## 2016-10-19 DIAGNOSIS — I110 Hypertensive heart disease with heart failure: Secondary | ICD-10-CM | POA: Diagnosis not present

## 2016-10-19 DIAGNOSIS — F3010 Manic episode without psychotic symptoms, unspecified: Secondary | ICD-10-CM | POA: Insufficient documentation

## 2016-10-19 DIAGNOSIS — F17210 Nicotine dependence, cigarettes, uncomplicated: Secondary | ICD-10-CM | POA: Insufficient documentation

## 2016-10-19 DIAGNOSIS — Z96659 Presence of unspecified artificial knee joint: Secondary | ICD-10-CM | POA: Insufficient documentation

## 2016-10-19 DIAGNOSIS — Z810 Family history of intellectual disabilities: Secondary | ICD-10-CM | POA: Diagnosis not present

## 2016-10-19 DIAGNOSIS — R9431 Abnormal electrocardiogram [ECG] [EKG]: Secondary | ICD-10-CM | POA: Diagnosis not present

## 2016-10-19 DIAGNOSIS — J449 Chronic obstructive pulmonary disease, unspecified: Secondary | ICD-10-CM | POA: Insufficient documentation

## 2016-10-19 DIAGNOSIS — F0631 Mood disorder due to known physiological condition with depressive features: Secondary | ICD-10-CM | POA: Diagnosis not present

## 2016-10-19 DIAGNOSIS — G4700 Insomnia, unspecified: Secondary | ICD-10-CM | POA: Diagnosis not present

## 2016-10-19 DIAGNOSIS — R4182 Altered mental status, unspecified: Secondary | ICD-10-CM | POA: Diagnosis not present

## 2016-10-19 DIAGNOSIS — Z79899 Other long term (current) drug therapy: Secondary | ICD-10-CM | POA: Insufficient documentation

## 2016-10-19 DIAGNOSIS — Z0000 Encounter for general adult medical examination without abnormal findings: Secondary | ICD-10-CM

## 2016-10-19 LAB — URINALYSIS, COMPLETE (UACMP) WITH MICROSCOPIC
BILIRUBIN URINE: NEGATIVE
Glucose, UA: NEGATIVE mg/dL
HGB URINE DIPSTICK: NEGATIVE
Ketones, ur: NEGATIVE mg/dL
NITRITE: NEGATIVE
PH: 6 (ref 5.0–8.0)
Protein, ur: NEGATIVE mg/dL
SPECIFIC GRAVITY, URINE: 1.005 (ref 1.005–1.030)

## 2016-10-19 LAB — CBC WITH DIFFERENTIAL/PLATELET
BASOS ABS: 0 10*3/uL (ref 0.0–0.1)
BASOS PCT: 0 %
EOS ABS: 0.1 10*3/uL (ref 0.0–0.7)
Eosinophils Relative: 1 %
HCT: 37 % (ref 36.0–46.0)
HEMOGLOBIN: 13.4 g/dL (ref 12.0–15.0)
Lymphocytes Relative: 24 %
Lymphs Abs: 2.3 10*3/uL (ref 0.7–4.0)
MCH: 31.3 pg (ref 26.0–34.0)
MCHC: 36.2 g/dL — ABNORMAL HIGH (ref 30.0–36.0)
MCV: 86.4 fL (ref 78.0–100.0)
Monocytes Absolute: 0.6 10*3/uL (ref 0.1–1.0)
Monocytes Relative: 6 %
NEUTROS PCT: 69 %
Neutro Abs: 6.5 10*3/uL (ref 1.7–7.7)
Platelets: 442 10*3/uL — ABNORMAL HIGH (ref 150–400)
RBC: 4.28 MIL/uL (ref 3.87–5.11)
RDW: 13.5 % (ref 11.5–15.5)
WBC: 9.4 10*3/uL (ref 4.0–10.5)

## 2016-10-19 LAB — COMPREHENSIVE METABOLIC PANEL
ALT: 16 U/L (ref 14–54)
AST: 20 U/L (ref 15–41)
Albumin: 4.3 g/dL (ref 3.5–5.0)
Alkaline Phosphatase: 77 U/L (ref 38–126)
Anion gap: 11 (ref 5–15)
BUN: 13 mg/dL (ref 6–20)
CHLORIDE: 91 mmol/L — AB (ref 101–111)
CO2: 26 mmol/L (ref 22–32)
CREATININE: 0.81 mg/dL (ref 0.44–1.00)
Calcium: 9.9 mg/dL (ref 8.9–10.3)
GFR calc Af Amer: >60 mL/min (ref >60)
Glucose, Bld: 138 mg/dL — ABNORMAL HIGH (ref 65–99)
Potassium: 4 mmol/L (ref 3.5–5.1)
Sodium: 128 mmol/L — ABNORMAL LOW (ref 135–145)
Total Bilirubin: 0.7 mg/dL (ref 0.3–1.2)
Total Protein: 7 g/dL (ref 6.5–8.1)

## 2016-10-19 LAB — RAPID URINE DRUG SCREEN, HOSP PERFORMED
AMPHETAMINES: NOT DETECTED
BARBITURATES: NOT DETECTED
Benzodiazepines: NOT DETECTED
Cocaine: NOT DETECTED
Opiates: NOT DETECTED
TETRAHYDROCANNABINOL: NOT DETECTED

## 2016-10-19 LAB — ETHANOL

## 2016-10-19 MED ORDER — LISINOPRIL 5 MG PO TABS
5.0000 mg | ORAL_TABLET | Freq: Every day | ORAL | Status: DC
  Administered 2016-10-19 – 2016-10-20 (×2): 5 mg via ORAL
  Filled 2016-10-19 (×2): qty 1

## 2016-10-19 MED ORDER — VENLAFAXINE HCL ER 37.5 MG PO CP24: 37.5000 mg | ORAL_CAPSULE | Freq: Every day | ORAL | Status: DC

## 2016-10-19 MED ORDER — MIDAZOLAM HCL 2 MG/2ML IJ SOLN
4.0000 mg | Freq: Once | INTRAMUSCULAR | Status: AC
  Administered 2016-10-19: 4 mg via INTRAMUSCULAR
  Filled 2016-10-19: qty 4

## 2016-10-19 MED ORDER — HALOPERIDOL LACTATE 5 MG/ML IJ SOLN
5.0000 mg | Freq: Once | INTRAMUSCULAR | Status: AC
  Administered 2016-10-19: 5 mg via INTRAMUSCULAR
  Filled 2016-10-19: qty 1

## 2016-10-19 MED ORDER — HALOPERIDOL LACTATE 5 MG/ML IJ SOLN
2.0000 mg | Freq: Once | INTRAMUSCULAR | Status: AC
  Administered 2016-10-19: 2 mg via INTRAMUSCULAR
  Filled 2016-10-19: qty 1

## 2016-10-19 MED ORDER — PANTOPRAZOLE SODIUM 40 MG PO TBEC
40.0000 mg | DELAYED_RELEASE_TABLET | Freq: Two times a day (BID) | ORAL | Status: DC
  Administered 2016-10-19 – 2016-10-20 (×3): 40 mg via ORAL
  Filled 2016-10-19 (×3): qty 1

## 2016-10-19 MED ORDER — STERILE WATER FOR INJECTION IJ SOLN
INTRAMUSCULAR | Status: AC
  Administered 2016-10-19: 1.2 mL
  Filled 2016-10-19: qty 10

## 2016-10-19 MED ORDER — ENSURE ENLIVE PO LIQD
237.0000 mL | Freq: Two times a day (BID) | ORAL | Status: DC
  Administered 2016-10-19 – 2016-10-20 (×2): 237 mL via ORAL
  Filled 2016-10-19 (×3): qty 237

## 2016-10-19 MED ORDER — LEVOTHYROXINE SODIUM 100 MCG PO TABS
100.0000 ug | ORAL_TABLET | Freq: Every day | ORAL | Status: DC
  Administered 2016-10-20: 100 ug via ORAL
  Filled 2016-10-19: qty 1

## 2016-10-19 MED ORDER — ZIPRASIDONE MESYLATE 20 MG IM SOLR
20.0000 mg | Freq: Once | INTRAMUSCULAR | Status: AC
  Administered 2016-10-19: 20 mg via INTRAMUSCULAR
  Filled 2016-10-19: qty 20

## 2016-10-19 MED ORDER — ASENAPINE MALEATE 5 MG SL SUBL
5.0000 mg | SUBLINGUAL_TABLET | Freq: Two times a day (BID) | SUBLINGUAL | Status: DC
  Administered 2016-10-19 – 2016-10-20 (×3): 5 mg via SUBLINGUAL
  Filled 2016-10-19 (×3): qty 1

## 2016-10-19 MED ORDER — DONEPEZIL HCL 5 MG PO TABS
5.0000 mg | ORAL_TABLET | Freq: Every day | ORAL | Status: DC
  Administered 2016-10-19: 5 mg via ORAL
  Filled 2016-10-19: qty 1

## 2016-10-19 MED ORDER — DIPHENHYDRAMINE HCL 50 MG/ML IJ SOLN
25.0000 mg | Freq: Once | INTRAMUSCULAR | Status: AC
  Administered 2016-10-19: 50 mg via INTRAMUSCULAR
  Filled 2016-10-19: qty 1

## 2016-10-19 MED ORDER — APIXABAN 5 MG PO TABS
5.0000 mg | ORAL_TABLET | Freq: Two times a day (BID) | ORAL | Status: DC
  Administered 2016-10-19 – 2016-10-20 (×3): 5 mg via ORAL
  Filled 2016-10-19 (×3): qty 1

## 2016-10-19 MED ORDER — LAMOTRIGINE 25 MG PO TABS
25.0000 mg | ORAL_TABLET | Freq: Every day | ORAL | Status: DC
  Administered 2016-10-19 – 2016-10-20 (×2): 25 mg via ORAL
  Filled 2016-10-19 (×2): qty 1

## 2016-10-19 MED ORDER — ACETAMINOPHEN 325 MG PO TABS: 650.0000 mg | ORAL_TABLET | ORAL | Status: DC | PRN

## 2016-10-19 MED ORDER — NICOTINE 21 MG/24HR TD PT24
21.0000 mg | MEDICATED_PATCH | Freq: Every day | TRANSDERMAL | Status: DC
  Administered 2016-10-20: 21 mg via TRANSDERMAL
  Filled 2016-10-19: qty 1

## 2016-10-19 MED ORDER — ALUM & MAG HYDROXIDE-SIMETH 200-200-20 MG/5ML PO SUSP: 30.0000 mL | Freq: Four times a day (QID) | ORAL | Status: DC | PRN

## 2016-10-19 NOTE — ED Notes (Signed)
Pt urinate but it was not enough she will try later

## 2016-10-19 NOTE — ED Notes (Addendum)
Apple juice/pedialyte mix provided.

## 2016-10-19 NOTE — ED Provider Notes (Signed)
[place] DEPT Provider Note   CSN: [id number] Arrival date & time: 10/19/16  0434     History   Chief Complaint Chief Complaint  Patient presents with  . IVC  . Manic Behavior    HPI Destiny Hughes is a 71 y.o. female.  The history is provided by a relative and medical records (IVC). History limited by: level 5 secondary to psychiatric disorder.  Mental Health Problem  Presenting symptoms: disorganized speech, disorganized thought process and hallucinations   Patient accompanied by:  Law enforcement Degree of incapacity (severity):  Severe Onset quality:  Unable to specify Timing:  Constant Progression:  Worsening Chronicity:  Recurrent Context: not alcohol use   Relieved by:  Nothing Worsened by:  Nothing Ineffective treatments:  None tried Associated symptoms: no abdominal pain   Risk factors: no hx of suicide attempts     Past Medical History:  Diagnosis Date  . Chronic back pain   . Collapse of right lung   . COPD (chronic obstructive pulmonary disease) (HCC)    Hx. Bronchitis occ, 01-25-11 somes issue now taking  Z-pak-started 01-24-11/Scar tissue  present  from previous lung collapse  . Depression   . GERD (gastroesophageal reflux disease)    tx. TUMS  . Glaucoma   . Hyperlipemia   . Hypothyroidism    tx. Levothyroxine  . Nephrolithiasis   . Neuromuscular disorder ([place]) 01-25-11   Pain/nerve stimulator implanted -2 yrs ago.    Patient Active Problem List   Diagnosis Date Noted  . Depression due to physical illness   . Cognitive retention disorder   . Atrial flutter ([place]) 08/15/2016  . Atrial fibrillation with RVR ([place]) 08/15/2016  . SVT (supraventricular tachycardia) ([place]) 07/11/2016  . Weight loss 07/08/2016  . AKI (acute kidney injury) ([place]) 07/02/2016  . Acute kidney injury ([place]) 07/01/2016  . Hyperglycemia 07/01/2016  . Hyponatremia 07/01/2016  . Intractable nausea and vomiting 07/01/2016  . Abnormal EKG 07/01/2016  . Diastolic CHF  ([place]) 42/68/3419  . Medication management 04/28/2015  . Emphysema lung ([place]) 12/26/2014  . GERD (gastroesophageal reflux disease) 12/26/2014  . Chronic bronchitis ([place]) 11/13/2014  . COPD (chronic obstructive pulmonary disease) ([place]) 10/03/2014  . At high risk for falls 10/03/2014  . Hypothyroidism 10/03/2014  . Mitral valve prolapse 10/03/2014  . Encounter for Medicare annual wellness exam 10/03/2014  . IBS (irritable bowel syndrome) 04/01/2014  . H/O total knee replacement 04/22/2013  . Essential hypertension 04/15/2013  . Vitamin D deficiency 04/15/2013  . Encounter for long-term (current) use of medications 04/15/2013  . Prediabetes 04/15/2013  . Osteoarthritis 01/31/2011  . Hyperlipemia   . Glaucoma   . Chronic pain disorder   . Chronic back pain     Past Surgical History:  Procedure Laterality Date  . A-FLUTTER ABLATION N/A 08/16/2016   Procedure: A-Flutter Ablation;  Surgeon: Evans Lance, MD;  Location: [facility] CV LAB;  Service: Cardiovascular;  Laterality: N/A;  . APPENDECTOMY  01-25-11  . CARPAL TUNNEL RELEASE  01-25-11   Bil.  . CERVICAL SPINE SURGERY  01-25-11   2005-multiple levels  . CHOLECYSTECTOMY  01-25-11   '07  . COLONOSCOPY WITH PROPOFOL N/A 07/23/2012   Procedure: COLONOSCOPY WITH PROPOFOL;  Surgeon: Garlan Fair, MD;  Location: [facility] ENDOSCOPY;  Service: Endoscopy;  Laterality: N/A;  . EYE SURGERY    . JOINT REPLACEMENT  01-25-11   6'03 LTHA-hemi  . KNEE ARTHROPLASTY  01-25-11   '04-right, revised x2  . SPINAL CORD STIMULATOR IMPLANT  2009 APPROX   DR. ELSNER  . THORACIC SPINE SURGERY  01-25-11   6'02- then nerve stimulator implanted after  . TOTAL KNEE REVISION  02/01/2011   Procedure: TOTAL KNEE REVISION;  Surgeon: Mauri Pole;  Location: [facility] ORS;  Service: Orthopedics;  Laterality: Right;  . TUBAL LIGATION      OB History    No data available       Home Medications    Prior to Admission medications   Medication Sig Start Date End  Date Taking? Authorizing Provider  apixaban (ELIQUIS) 5 MG TABS tablet Take 1 tablet (5 mg total) by mouth 2 (two) times daily. 08/09/16   Sherran Needs, NP  donepezil (ARICEPT) 5 MG tablet Take 1 tablet (5 mg total) by mouth at bedtime. 10/12/16   Mikhail, Velta Addison, DO  feeding supplement, ENSURE ENLIVE, (ENSURE ENLIVE) LIQD Take 237 mLs by mouth 2 (two) times daily between meals. 10/12/16   Mikhail, Velta Addison, DO  gabapentin (NEURONTIN) 100 MG capsule Take 1 capsule (100 mg total) by mouth 2 (two) times daily. 10/12/16   Cristal Ford, DO  levalbuterol [facility] HFA) 45 MCG/ACT inhaler Inhale 2 puffs into the lungs every 4 (four) hours as needed for wheezing. 09/22/16   Pixie Casino, MD  levothyroxine (SYNTHROID) 100 MCG tablet Take 1 tablet (100 mcg total) by mouth daily before breakfast. 09/26/16   Evans Lance, MD  lisinopril (PRINIVIL,ZESTRIL) 5 MG tablet Take 1 tablet (5 mg total) by mouth daily. 08/31/16   Skeet Latch, MD  magnesium oxide (MAG-OX) 400 MG tablet Take 1 tablet (400 mg total) by mouth daily. Patient taking differently: Take 400 mg by mouth daily as needed (nausea or reflux).  09/07/16   Erlene Quan, PA-C  nicotine (NICODERM CQ - DOSED IN MG/24 HOURS) 21 mg/24hr patch Place 1 patch (21 mg total) onto the skin daily. 10/13/16   Mikhail, Velta Addison, DO  pantoprazole (PROTONIX) 40 MG tablet Take 1 tablet (40 mg total) by mouth 2 (two) times daily. 07/14/16   Ghimire, Henreitta Leber, MD  propranolol (INDERAL) 80 MG tablet Take 80 mg by mouth daily. For seven days Started on 09-29-16    [provider]  venlafaxine XR (EFFEXOR-XR) 37.5 MG 24 hr capsule Take 1 capsule (37.5 mg total) by mouth daily with breakfast. 10/13/16   Cristal Ford, DO  zolpidem (AMBIEN) 5 MG tablet Take 1 tablet (5 mg total) by mouth at bedtime as needed for sleep. 10/12/16   Cristal Ford, DO    Family History Family History  Problem Relation Age of Onset  . Heart attack Father   . Emphysema  Father   . Aneurysm Mother        CEREBRAL  . Emphysema Mother   . Dementia Mother   . Diabetes Son   . Hypertension Son   . Hypertension Son   . Cancer Sister        Widely Metastatic  . Asthma Son        x2  . Breast cancer Paternal Aunt   . Rheum arthritis Maternal Aunt     Social History Social History  Substance Use Topics  . Smoking status: Current Every Day Smoker    Packs/day: 0.75    Years: 50.00    Types: Cigarettes    Start date: 09/13/1961  . Smokeless tobacco: Never Used     Comment: Decreased Down to 2/3 a Day if Thinking [place] It  . Alcohol use No  Comment: none in 10 years     Allergies   Albuterol; Penicillins; Ventolin [kdc:albuterol]; Sulfa antibiotics; Zanaflex [tizanidine]; Codeine; and Ecotrin [aspirin]   Review of Systems Review of Systems  Unable to perform ROS: Psychiatric disorder  Gastrointestinal: Negative for abdominal pain.  Psychiatric/Behavioral: Positive for hallucinations.     Physical Exam Updated Vital Signs BP (!) 150/79 (BP Location: Left Arm)   Pulse (!) 104   Temp 97.9 F (36.6 C) (Oral)   Resp 20   Ht 5\' 6"  (1.676 m)   Wt 65.8 kg (145 lb)   SpO2 99%   BMI 23.40 kg/m   Physical Exam  Constitutional: She appears well-developed and well-nourished. No distress.  HENT:  Head: Normocephalic and atraumatic.  Mouth/Throat: No oropharyngeal exudate.  Eyes: Pupils are equal, round, and reactive to light. Conjunctivae are normal.  Neck: Normal range of motion. Neck supple.  Cardiovascular: Normal rate, regular rhythm, normal heart sounds and intact distal pulses.   Pulmonary/Chest: Effort normal and breath sounds normal. No respiratory distress.  Abdominal: Soft. Bowel sounds are normal. She exhibits no mass. There is no tenderness. There is no rebound and no guarding.  Musculoskeletal: Normal range of motion.  Neurological: She is alert. She displays normal reflexes.  Skin: Skin is warm. Capillary refill takes less  than 2 seconds.  Psychiatric: Her affect is labile. Her speech is rapid and/or pressured and tangential.     ED Treatments / Results   Vitals:   10/19/16 0439  BP: (!) 150/79  Pulse: (!) 104  Resp: 20  Temp: 97.9 F (36.6 C)    Labs (all labs ordered are listed, but only abnormal results are displayed) Results for orders placed or performed during the hospital encounter of 10/19/16  Comprehensive metabolic panel  Result Value Ref Range   Sodium 128 (L) 135 - 145 mmol/L   Potassium 4.0 3.5 - 5.1 mmol/L   Chloride 91 (L) 101 - 111 mmol/L   CO2 26 22 - 32 mmol/L   Glucose, Bld 138 (H) 65 - 99 mg/dL   BUN 13 6 - 20 mg/dL   Creatinine, Ser 0.81 0.44 - 1.00 mg/dL   Calcium 9.9 8.9 - 10.3 mg/dL   Total Protein 7.0 6.5 - 8.1 g/dL   Albumin 4.3 3.5 - 5.0 g/dL   AST 20 15 - 41 U/L   ALT 16 14 - 54 U/L   Alkaline Phosphatase 77 38 - 126 U/L   Total Bilirubin 0.7 0.3 - 1.2 mg/dL   GFR calc non Af Amer >60 >60 mL/min   GFR calc Af Amer >60 >60 mL/min   Anion gap 11 5 - 15  Ethanol  Result Value Ref Range   Alcohol, Ethyl (B) <5 <5 mg/dL  CBC with Diff  Result Value Ref Range   WBC 9.4 4.0 - 10.5 K/uL   RBC 4.28 3.87 - 5.11 MIL/uL   Hemoglobin 13.4 12.0 - 15.0 g/dL   HCT 37.0 36.0 - 46.0 %   MCV 86.4 78.0 - 100.0 fL   MCH 31.3 26.0 - 34.0 pg   MCHC 36.2 (H) 30.0 - 36.0 g/dL   RDW 13.5 11.5 - 15.5 %   Platelets 442 (H) 150 - 400 K/uL   Neutrophils Relative % 69 %   Neutro Abs 6.5 1.7 - 7.7 K/uL   Lymphocytes Relative 24 %   Lymphs Abs 2.3 0.7 - 4.0 K/uL   Monocytes Relative 6 %   Monocytes Absolute 0.6 0.1 - 1.0 K/uL  Eosinophils Relative 1 %   Eosinophils Absolute 0.1 0.0 - 0.7 K/uL   Basophils Relative 0 %   Basophils Absolute 0.0 0.0 - 0.1 K/uL   Dg Chest 2 View  Result Date: 10/02/2016 CLINICAL DATA:  Hyponatremia. Dizziness and confusion. Shortness of breath. EXAM: CHEST  2 VIEW COMPARISON:  Two-view chest x-ray 09/22/2016. FINDINGS: The heart size is normal.  The lungs are clear. Chronic scarring at the right lung base is stable. There is no edema or effusion. No focal airspace disease is present. Upper thoracic kyphosis is stable. IMPRESSION: 1. No acute cardiopulmonary disease or significant interval change. Electronically Signed   By: San Morelle M.D.   On: 10/02/2016 20:41   Dg Chest 2 View  Result Date: 09/22/2016 CLINICAL DATA:  Tachycardia for 1 week. Chest pain of unknown duration. EXAM: CHEST  2 VIEW COMPARISON:  Single-view of the chest 08/15/2016. PA and lateral chest 08/03/2016. FINDINGS: The lungs are clear. Heart size is normal. No pneumothorax or pleural effusion. Mild pleural scarring along the periphery of the right lung base is unchanged. No acute bony abnormality. The patient is status post cervical fusion. Spinal stimulator is in place. IMPRESSION: No acute disease. Electronically Signed   By: Inge Rise M.D.   On: 09/22/2016 10:39   Ct Head W & Wo Contrast  Result Date: 10/04/2016 CLINICAL DATA:  Increasing confusion for 3 weeks, falls and forgetfulness. History of hypertension, hyperlipidemia, atrial fibrillation. EXAM: CT HEAD WITHOUT AND WITH CONTRAST TECHNIQUE: Contiguous axial images were obtained from the base of the skull through the vertex without and with intravenous contrast CONTRAST:  10mL ISOVUE-300 IOPAMIDOL (ISOVUE-300) INJECTION 61% COMPARISON:  CT HEAD [facility] 20, 2018 FINDINGS: BRAIN: No intraparenchymal hemorrhage, mass effect nor midline shift. The ventricles and sulci are normal for age. RIGHT frontal developmental venous anomaly. Patchy supratentorial white matter hypodensities less than expected for patient's age, though non-specific are most compatible with chronic small vessel ischemic disease. No acute large vascular territory infarcts. No abnormal extra-axial fluid collections. Basal cisterns are patent. No suspicious intracranial enhancement. VASCULAR: Mild calcific atherosclerosis of the carotid  siphons. SKULL: No skull fracture. No significant scalp soft tissue swelling. SINUSES/ORBITS: The mastoid air-cells and included paranasal sinuses are well-aerated.The included ocular globes and orbital contents are non-suspicious. OTHER: None. IMPRESSION: Negative CT HEAD with and without contrast for age. Electronically Signed   By: Elon Alas M.D.   On: 10/04/2016 00:40   Dg Chest Port 1 View  Result Date: 10/07/2016 CLINICAL DATA:  Shortness of breath, COPD, smoker EXAM: PORTABLE CHEST 1 VIEW COMPARISON:  Portable exam at 1732 hrs compared to 10/02/2016 FINDINGS: Intraspinal stimulator lead projects over upper thoracic spine at the T2-T3 levels. Normal heart size, mediastinal contours, and pulmonary vascularity. Atherosclerotic calcification aorta. Minimal central peribronchial thickening with scarring at lateral lower RIGHT chest. Lungs mildly hyperinflated but otherwise clear. No pleural effusion or pneumothorax. Bones demineralized. IMPRESSION: Minimal bronchitic changes and RIGHT basilar scarring. No acute abnormalities. Aortic Atherosclerosis (ICD10-I70.0). Electronically Signed   By: Lavonia Dana M.D.   On: 10/07/2016 17:54     Procedures Procedures (including critical care time)   EKG Interpretation  Date/Time:  Wednesday October 19 2016 04:56:49 EDT Ventricular Rate:  100 PR Interval:  230 QRS Duration: 86 QT Interval:  350 QTC Calculation: 451 R Axis:   -53 Text Interpretation:  Sinus rhythm with 1st degree A-V block Right atrial enlargement Pulmonary disease pattern Confirmed by Randal Buba, Kriston Mckinnie ([id number]) on 10/19/2016 5:21:35 AM  Final Clinical Impressions(s) / ED Diagnoses  Manic:  Sodium is above patient's discharge level and patient has a long history of this problem based on review of medical records. I do not feel this is the cause of the patient's symptoms.   We will restrict free water and given patient pedialyte to prevent sodium from decreasing and I suspect  her sodium will increase as I suspect based on chloride that the patient has a component of psychogenic polydipsia.  Medically cleared for psychiatry     Caroline, Vyron Fronczak, MD 10/19/16 [phone]
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
## 2016-10-19 NOTE — BH Assessment (Signed)
[facility] Assessment Progress Note  Per Corena Pilgrim, MD, this pt requires psychiatric hospitalization at this time.  Pt presents under IVC initiated by her husband, which Dr Darleene Cleaver has upheld.  The following facilities have been contacted to seek placement for this pt, with results as noted:  Beds available, information sent, decision pending:  [facility]   Pt was not referred to these facilities; they are not able to program for dementia patients:  [facility]   At capacity:  [facility]   [place], [place] Triage Specialist [phone]

## 2016-10-19 NOTE — ED Notes (Signed)
Pt stating "Milon Score, Twitty, titty, titty, is that [employer].  I like her because she's got big brown eyes.  If you would turn on that light I would be so happy because 27 is showing I'm crazy.  It's Jesus & somewhere in this room I have a computer that has a red light.  If that's not true, I'll kiss your royal red butt.  This is real bad lady.  I 1 instead of 2 dots.  And her name is Event organiser.  This is bad lady.  This very bad.  I lost a son and you lost a computer."  Pt continues rambling.

## 2016-10-19 NOTE — ED Notes (Signed)
Pt attempting to leave department.  Pt was re-directed back to room.
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
## 2016-10-19 NOTE — ED Triage Notes (Addendum)
Pt brought in by [employer] under IVC.  Pt is alert but disoriented to date, place, & time.  Pt with loose associations.

## 2016-10-19 NOTE — ED Notes (Signed)
Son called stating "my dad wanted me to remind you all my mother has a battery pack in her TENS unit for her back and she can't have any MRI's or it will explode."

## 2016-10-19 NOTE — BH Assessment (Addendum)
Assessment Note  Destiny Hughes is an 71 y.o. female. She presents to [facility] with IVC papers. The IVC papers state, "Patient is not sleeping. Has hallucinations, hears buzzing, and bells ringing. Found by others talking to herself constantly. Is hostile toward others. Threatens family members." IVC papers initiated by [employer] [phone].   Writer met with patient face to face. She was unaware of the circumstances related to this ER visit. She was calm, cooperative, and pleasant. She denies suicidal thoughts. No prior suicide attempts noted. She denies self mutilating behaviors. Depressive symptom includes insomnia. Appetite is good. No significant weight loss or gain. Sleeping patterns are fair. Denies homicidal thoughts. She is currently calm and cooperative. No legal issues. No AVH's reported. Patient does not respond to internal stimuli. No substance abuse reported. Patient does not have a outpatient therapist or psychiatrist. She denies a history of INPT hospitalizations. Spouse and 2 sons are considered her support systems. No history of physical, sexual, and/or emotional abuse. Patient is dressed in scrubs. Speech is normal. Mood is appropriate.    Diagnosis: Dementia with psychosis and Major Depressive Disorder, Recurrent, Severe, without psychotic features  Past Medical History:  Past Medical History:  Diagnosis Date  . Chronic back pain   . Collapse of right lung   . COPD (chronic obstructive pulmonary disease) (HCC)    Hx. Bronchitis occ, 01-25-11 somes issue now taking  Z-pak-started 01-24-11/Scar tissue  present  from previous lung collapse  . Depression   . GERD (gastroesophageal reflux disease)    tx. TUMS  . Glaucoma   . Hyperlipemia   . Hypothyroidism    tx. Levothyroxine  . Nephrolithiasis   . Neuromuscular disorder ([place]) 01-25-11   Pain/nerve stimulator implanted -2 yrs ago.    Past Surgical History:  Procedure Laterality Date  . A-FLUTTER ABLATION  N/A 08/16/2016   Procedure: A-Flutter Ablation;  Surgeon: Evans Lance, MD;  Location: [facility];  Service: Cardiovascular;  Laterality: N/A;  . APPENDECTOMY  01-25-11  . CARPAL TUNNEL RELEASE  01-25-11   Bil.  . CERVICAL SPINE SURGERY  01-25-11   2005-multiple levels  . CHOLECYSTECTOMY  01-25-11   '07  . COLONOSCOPY WITH PROPOFOL N/A 07/23/2012   Procedure: COLONOSCOPY WITH PROPOFOL;  Surgeon: Garlan Fair, MD;  Location: [facility];  Service: Endoscopy;  Laterality: N/A;  . EYE SURGERY    . JOINT REPLACEMENT  01-25-11   6'03 LTHA-hemi  . KNEE ARTHROPLASTY  01-25-11   '04-right, revised x2  . SPINAL CORD STIMULATOR IMPLANT  2009 APPROX   DR. ELSNER  . THORACIC SPINE SURGERY  01-25-11   6'02- then nerve stimulator implanted after  . TOTAL KNEE REVISION  02/01/2011   Procedure: TOTAL KNEE REVISION;  Surgeon: Mauri Pole;  Location: [facility];  Service: Orthopedics;  Laterality: Right;  . TUBAL LIGATION      Family History:  Family History  Problem Relation Age of Onset  . Heart attack Father   . Emphysema Father   . Aneurysm Mother        CEREBRAL  . Emphysema Mother   . Dementia Mother   . Diabetes Son   . Hypertension Son   . Hypertension Son   . Cancer Sister        Widely Metastatic  . Asthma Son        x2  . Breast cancer Paternal Aunt   . Rheum arthritis Maternal Aunt     Social History:  reports  that she has been smoking Cigarettes.  She started smoking about 55 years ago. She has a 37.50 pack-year smoking history. She has never used smokeless tobacco. She reports that she does not drink alcohol or use drugs.  Additional Social History:  Alcohol / Drug Use Pain Medications: See MAR Prescriptions: See MAR Over the Counter: See MAR  History of alcohol / drug use?: No history of alcohol / drug abuse  CIWA: CIWA-Ar BP: (!) 150/79 Pulse Rate: (!) 104 COWS:    Allergies:  Allergies  Allergen Reactions  . Albuterol Other (See Comments)    Had  difficulty breathing after a nebulizer treatment  . Penicillins Anaphylaxis and Swelling    Has patient had a PCN reaction causing immediate rash, facial/tongue/throat swelling, SOB or lightheadedness with hypotension: Yes Has patient had a PCN reaction causing severe rash involving mucus membranes or skin necrosis: Rash Has patient had a PCN reaction that required hospitalization: No Has patient had a PCN reaction occurring within the last 10 years: No If all of the above answers are "NO", then may proceed with Cephalosporin use.  [name] [Kdc:Albuterol] Shortness Of Breath  . Sulfa Antibiotics Swelling    Throat swells, but no shortness of breath noted  . Zanaflex [Tizanidine] Other (See Comments)    Possible confusion (??)  . Codeine Nausea And Vomiting  . Ecotrin [Aspirin] Nausea And Vomiting    Home Medications:  (Not in a hospital admission)  OB/GYN Status:  No LMP recorded. Patient is postmenopausal.  General Assessment Data [facility] of Assessment: [facility] TTS Assessment: In system Is this a Tele or Face-to-Face Assessment?: Face-to-Face Is this an Initial Assessment or a Re-assessment for this encounter?: Initial Assessment Marital status: Married [place] name:  Kellie Simmering ) Is patient pregnant?: No Pregnancy Status: No Living Arrangements: Spouse/significant other Can pt return to current living arrangement?: Yes Admission Status: Other (Comment) Is patient capable of signing voluntary admission?: Yes Referral Source: Self/Family/Friend Insurance type:  (Medicare )  Medical Screening Exam ([place]) Medical Exam completed: Yes  Crisis Care Plan Living Arrangements: Spouse/significant other Legal Guardian: Other: (no legal guardian ) Name of Psychiatrist:  ("I have no idea") Name of Therapist:  ("I have no idea")  Education Status Is patient currently in school?: No Current Grade:  (n/a) Highest grade of school patient has completed: 12 Name of school:   (n/a) Contact person:  (n/a)  Risk to self with the past 6 months Suicidal Ideation: No Has patient been a risk to self within the past 6 months prior to admission? : No Suicidal Intent: No Has patient had any suicidal intent within the past 6 months prior to admission? : No Is patient at risk for suicide?: No (No ) Suicidal Plan?: No Has patient had any suicidal plan within the past 6 months prior to admission? : No Access to Means: No What has been your use of drugs/alcohol within the last 12 months?:  (none reported) Previous Attempts/Gestures: No How many times?:  (0) Other Self Harm Risks:  (no self harm risks) Triggers for Past Attempts:  (no past attempts or gestures ) Intentional Self Injurious Behavior: None Family Suicide History: No Recent stressful life event(s): Loss (Comment) Persecutory voices/beliefs?: No Depression: Yes Depression Symptoms: Insomnia Substance abuse history and/or treatment for substance abuse?: No Suicide prevention information given to non-admitted patients: Not applicable  Risk to Others within the past 6 months Homicidal Ideation: No Does patient have any lifetime risk of violence toward others beyond  the six months prior to admission? : No Thoughts of Harm to Others: No Current Homicidal Intent: No Current Homicidal Plan: No Access to Homicidal Means: No Identified Victim:  (n/a) History of harm to others?: No Assessment of Violence: None Noted Violent Behavior Description:  (Patient is calm and cooperative ) Does patient have access to weapons?: No Criminal Charges Pending?: No Does patient have a court date: No Is patient on probation?: No  Psychosis Hallucinations: None noted (pt denies) Delusions: None noted  Mental Status Report Appearance/Hygiene: Unremarkable Eye Contact: Good Motor Activity: Freedom of movement Speech: Logical/coherent Level of Consciousness: Alert Mood: Irritable Affect: Irritable Anxiety Level:  None Thought Processes: Circumstantial Judgement: Impaired Orientation: Person, Time, Situation, Place Obsessive Compulsive Thoughts/Behaviors: None  Cognitive Functioning Concentration: Decreased Memory: Recent Intact, Remote Intact IQ: Above Average Insight: Poor Impulse Control: Fair Appetite: Fair Weight Loss:  (25 pounds of weight loss ) Weight Gain:  (0) Sleep: Decreased Total Hours of Sleep:  ("I am not really sure") Vegetative Symptoms: Staying in bed  ADLScreening [facility] Assessment Services) Patient's cognitive ability adequate to safely complete daily activities?: Yes Patient able to express need for assistance with ADLs?: Yes Independently performs ADLs?: Yes (appropriate for developmental age)  Prior Inpatient Therapy Prior Inpatient Therapy: Yes Prior Therapy Dates:  (n/a) Prior Therapy Facilty/Provider(s):  [facility]) Reason for Treatment:  (n/a)  Prior Outpatient Therapy Prior Outpatient Therapy: No Prior Therapy Dates:  (n/a) Prior Therapy Facilty/Provider(s):  (n/a) Reason for Treatment:  (n/a) Does patient have an ACCT team?: No Does patient have Intensive In-House Services?  : No Does patient have [facility] services? : No Does patient have P4CC services?: No  ADL Screening (condition at time of admission) Patient's cognitive ability adequate to safely complete daily activities?: Yes Is the patient deaf or have difficulty hearing?: No Does the patient have difficulty seeing, even when wearing glasses/contacts?: No Does the patient have difficulty concentrating, remembering, or making decisions?: No Patient able to express need for assistance with ADLs?: Yes Does the patient have difficulty dressing or bathing?: No Independently performs ADLs?: Yes (appropriate for developmental age) Does the patient have difficulty walking or climbing stairs?: No Weakness of Legs: None Weakness of Arms/Hands: None  Home Assistive Devices/Equipment Home Assistive  Devices/Equipment: None    Abuse/Neglect Assessment (Assessment to be complete while patient is alone) Physical Abuse: Denies Verbal Abuse: Denies Sexual Abuse: Denies Exploitation of patient/patient's resources: Denies Self-Neglect: Denies Values / Beliefs Cultural Requests During Hospitalization: None Spiritual Requests During Hospitalization: None   Advance Directives (For Healthcare) Does Patient Have a Medical Advance Directive?: Yes Type of Advance Directive: Healthcare Power of Attorney, Living will Copy of [place] in Chart?: No - copy requested Copy of Living Will in Chart?: No - copy requested Would patient like information on creating a medical advance directive?: No - Patient declined Nutrition Screen- [facility] Patient's home diet: Regular  Additional Information 1:1 In Past 12 Months?: No CIRT Risk: No Elopement Risk: No Does patient have medical clearance?: Yes     Disposition: Per Dr. Darleene Cleaver and Lurline Del, NP, patient meets criteria for INPT (Geropsychiatric) treatment. Disposition Initial Assessment Completed for this Encounter: Yes  On Site Evaluation by:   Reviewed with Physician:    Waldon Merl 10/19/2016 8:19 AM

## 2016-10-19 NOTE — Progress Notes (Signed)
Anderson Malta at [facility] called requesting pt's need for a bed.  CSW confirmed pt needs a bed.  CSW awaiting return call from [facility].  Alphonse Guild. Jawanda Passey, Reed Pandy, CSI Clinical Social Worker Ph: [phone]

## 2016-10-20 DIAGNOSIS — G8929 Other chronic pain: Secondary | ICD-10-CM | POA: Diagnosis not present

## 2016-10-20 DIAGNOSIS — R05 Cough: Secondary | ICD-10-CM | POA: Diagnosis not present

## 2016-10-20 DIAGNOSIS — F3010 Manic episode without psychotic symptoms, unspecified: Secondary | ICD-10-CM | POA: Diagnosis not present

## 2016-10-20 DIAGNOSIS — J9601 Acute respiratory failure with hypoxia: Secondary | ICD-10-CM | POA: Diagnosis not present

## 2016-10-20 DIAGNOSIS — I480 Paroxysmal atrial fibrillation: Secondary | ICD-10-CM | POA: Diagnosis present

## 2016-10-20 DIAGNOSIS — I110 Hypertensive heart disease with heart failure: Secondary | ICD-10-CM | POA: Diagnosis not present

## 2016-10-20 DIAGNOSIS — E038 Other specified hypothyroidism: Secondary | ICD-10-CM | POA: Diagnosis not present

## 2016-10-20 DIAGNOSIS — Z810 Family history of intellectual disabilities: Secondary | ICD-10-CM

## 2016-10-20 DIAGNOSIS — I259 Chronic ischemic heart disease, unspecified: Secondary | ICD-10-CM | POA: Diagnosis not present

## 2016-10-20 DIAGNOSIS — F0631 Mood disorder due to known physiological condition with depressive features: Secondary | ICD-10-CM

## 2016-10-20 DIAGNOSIS — J441 Chronic obstructive pulmonary disease with (acute) exacerbation: Secondary | ICD-10-CM | POA: Diagnosis not present

## 2016-10-20 DIAGNOSIS — J9602 Acute respiratory failure with hypercapnia: Secondary | ICD-10-CM | POA: Diagnosis not present

## 2016-10-20 DIAGNOSIS — M545 Low back pain: Secondary | ICD-10-CM | POA: Diagnosis not present

## 2016-10-20 DIAGNOSIS — I10 Essential (primary) hypertension: Secondary | ICD-10-CM | POA: Diagnosis not present

## 2016-10-20 DIAGNOSIS — G4700 Insomnia, unspecified: Secondary | ICD-10-CM

## 2016-10-20 DIAGNOSIS — Z882 Allergy status to sulfonamides status: Secondary | ICD-10-CM | POA: Diagnosis not present

## 2016-10-20 DIAGNOSIS — Z885 Allergy status to narcotic agent status: Secondary | ICD-10-CM | POA: Diagnosis not present

## 2016-10-20 DIAGNOSIS — Z720 Tobacco use: Secondary | ICD-10-CM | POA: Diagnosis not present

## 2016-10-20 DIAGNOSIS — F323 Major depressive disorder, single episode, severe with psychotic features: Secondary | ICD-10-CM | POA: Diagnosis not present

## 2016-10-20 DIAGNOSIS — Z888 Allergy status to other drugs, medicaments and biological substances status: Secondary | ICD-10-CM | POA: Diagnosis not present

## 2016-10-20 DIAGNOSIS — E039 Hypothyroidism, unspecified: Secondary | ICD-10-CM | POA: Diagnosis not present

## 2016-10-20 DIAGNOSIS — G309 Alzheimer's disease, unspecified: Secondary | ICD-10-CM | POA: Diagnosis not present

## 2016-10-20 DIAGNOSIS — F17210 Nicotine dependence, cigarettes, uncomplicated: Secondary | ICD-10-CM

## 2016-10-20 DIAGNOSIS — R0789 Other chest pain: Secondary | ICD-10-CM | POA: Diagnosis not present

## 2016-10-20 DIAGNOSIS — Z79899 Other long term (current) drug therapy: Secondary | ICD-10-CM | POA: Diagnosis not present

## 2016-10-20 DIAGNOSIS — M549 Dorsalgia, unspecified: Secondary | ICD-10-CM | POA: Diagnosis present

## 2016-10-20 DIAGNOSIS — Z7901 Long term (current) use of anticoagulants: Secondary | ICD-10-CM | POA: Diagnosis not present

## 2016-10-20 DIAGNOSIS — Z880 Allergy status to penicillin: Secondary | ICD-10-CM | POA: Diagnosis not present

## 2016-10-20 DIAGNOSIS — I4891 Unspecified atrial fibrillation: Secondary | ICD-10-CM | POA: Diagnosis present

## 2016-10-20 DIAGNOSIS — Z818 Family history of other mental and behavioral disorders: Secondary | ICD-10-CM | POA: Diagnosis not present

## 2016-10-20 DIAGNOSIS — F0391 Unspecified dementia with behavioral disturbance: Secondary | ICD-10-CM | POA: Diagnosis not present

## 2016-10-20 DIAGNOSIS — Z886 Allergy status to analgesic agent status: Secondary | ICD-10-CM | POA: Diagnosis not present

## 2016-10-20 DIAGNOSIS — F0281 Dementia in other diseases classified elsewhere with behavioral disturbance: Secondary | ICD-10-CM | POA: Diagnosis not present

## 2016-10-20 DIAGNOSIS — I5031 Acute diastolic (congestive) heart failure: Secondary | ICD-10-CM | POA: Diagnosis not present

## 2016-10-20 DIAGNOSIS — K219 Gastro-esophageal reflux disease without esophagitis: Secondary | ICD-10-CM | POA: Diagnosis not present

## 2016-10-20 DIAGNOSIS — E785 Hyperlipidemia, unspecified: Secondary | ICD-10-CM | POA: Diagnosis present

## 2016-10-20 DIAGNOSIS — E222 Syndrome of inappropriate secretion of antidiuretic hormone: Secondary | ICD-10-CM | POA: Diagnosis not present

## 2016-10-20 DIAGNOSIS — E871 Hypo-osmolality and hyponatremia: Secondary | ICD-10-CM | POA: Diagnosis not present

## 2016-10-20 DIAGNOSIS — J449 Chronic obstructive pulmonary disease, unspecified: Secondary | ICD-10-CM | POA: Diagnosis present

## 2016-10-20 DIAGNOSIS — R062 Wheezing: Secondary | ICD-10-CM | POA: Diagnosis not present

## 2016-10-20 DIAGNOSIS — F17200 Nicotine dependence, unspecified, uncomplicated: Secondary | ICD-10-CM | POA: Diagnosis not present

## 2016-10-20 MED ORDER — ONDANSETRON 4 MG PO TBDP
4.0000 mg | ORAL_TABLET | Freq: Once | ORAL | Status: AC
  Administered 2016-10-20: 4 mg via ORAL
  Filled 2016-10-20: qty 1

## 2016-10-20 NOTE — ED Notes (Signed)
Patient resting in bed, watching TV.  Calm and cooperative.  Sitter at bedside.

## 2016-10-20 NOTE — ED Notes (Signed)
Dr. Dina Rich informed of pt c/o nausea.  Orders received.  See orders.

## 2016-10-20 NOTE — ED Notes (Signed)
Pt OOB & in to hallway stating "I'm supposed to be leaving this morning in 3 hours.  My husband is coming to get me."  Pt redirected back to room.  Pt in to BR.

## 2016-10-20 NOTE — ED Notes (Signed)
Report called to Educational psychologist at [facility].

## 2016-10-20 NOTE — Patient Outreach (Signed)
    This RNCM received a call from patient's husband, Destiny Hughes advises this RNCM he is at wits end, his wife keeps asking the same question over and over again. Destiny Hughes states [facility] Nurse had come out today, stated he wanted to know when they were going to send a Education officer, museum out.  This RNCM advised Destiny Hughes that I would have to call advance to see if I could get some information as to when the social worker was going to come. This RNCM also reviewed the process of admission to an assisted living facility for respite care. Destiny Hughes then stated he wanted to know what happened to the wheelchair she was suppose to get. This RNCM cautioned Destiny Hughes, requested we focus on one issue at a time.  Destiny Hughes was talking very fast, sounded slurred. This RNCM made call to [place]. Spoke to representative who advised that a Education officer, museum, Carlos American has been assigned to patient's case and the representative was not able to give a date as the Education officer, museum sets her own schedule.  This RNCM made call to Destiny Hughes to advise him a Education officer, museum from [place] has been assigned to his wife's case. RNCM also advised Destiny Hughes the social worker would call to schedule a time to come out. This RNCM also advised Destiny Hughes that in order to get the patient and him resources, I was going to contact Adult Scientist, forensic for a Education officer, museum could be assigned from the [place].  Destiny Hughes was agreeable to have Adult Scientist, forensic. Destiny Hughes was advised the physical therapist would evaluate his wife to see if a wheelchair is needed.  Destiny Hughes advised about in home aide services that could be hired, but privately paid to assist with patient to give him time to rest. Destiny Hughes said he would think about it but did not think he would want it. Destiny Hughes assured this RNCM even though he is at his witt's end, he is not homicidal or suicidal.  This RNCM made call to Adult Protective  Services, spoke to Ms. Marcello Moores who took the report. Ms. Marcello Moores advised this RNCM she would assign a Education officer, museum to assist with assessing needs of this couple

## 2016-10-20 NOTE — ED Notes (Signed)
Sheriff called for transport  

## 2016-10-20 NOTE — BH Assessment (Addendum)
[facility] Assessment Progress Note  Per Corena Pilgrim, MD, this pt requires psychiatric hospitalization at this time.  Pt presents under IVC initiated by pt's husband, which Dr Darleene Cleaver has upheld.  At 13:25 Melissa calls from [facility] to report that pt has been accepted to their facility by Dr Ananias Pilgrim.  [place], DO concurs with this decision.  [name] nurse, Caren Griffins, has been notified, and agrees to call report to [phone].  Pt is to be transported via [facility].  Jalene Mullet, [place] Triage Specialist [phone]

## 2016-10-20 NOTE — Consult Note (Signed)
[facility] Consult   Reason for Consult:  Psychosis, depression, disorganized behavior Referring Physician:  EDP Patient Identification: Destiny Hughes MRN:  [id number] Principal Diagnosis: Major depression with psychotic features [facility]) Diagnosis:   Patient Active Problem List   Diagnosis Date Noted  . Major depression with psychotic features ([place]) [F32.3] 10/19/2016    Priority: High  . Dementia with psychosis [F03.91] 10/19/2016    Priority: High  . Depression due to physical illness [F06.31]   . Cognitive retention disorder [R41.89]   . Atrial flutter ([place]) [I48.92] 08/15/2016  . Atrial fibrillation with RVR ([place]) [I48.91] 08/15/2016  . SVT (supraventricular tachycardia) ([place]) [I47.1] 07/11/2016  . Weight loss [R63.4] 07/08/2016  . AKI (acute kidney injury) ([place]) [N17.9] 07/02/2016  . Acute kidney injury ([place]) [N17.9] 07/01/2016  . Hyperglycemia [R73.9] 07/01/2016  . Hyponatremia [E87.1] 07/01/2016  . Intractable nausea and vomiting [R11.2] 07/01/2016  . Abnormal EKG [R94.31] 07/01/2016  . Diastolic CHF ([place]) [J82.50] 10/13/2015  . Medication management [Z79.899] 04/28/2015  . Emphysema lung ([place]) [J43.9] 12/26/2014  . GERD (gastroesophageal reflux disease) [K21.9] 12/26/2014  . Chronic bronchitis ([place]) [J42] 11/13/2014  . COPD (chronic obstructive pulmonary disease) ([place]) [J44.9] 10/03/2014  . At high risk for falls [Z91.81] 10/03/2014  . Hypothyroidism [E03.9] 10/03/2014  . Mitral valve prolapse [I34.1] 10/03/2014  . Encounter for Medicare annual wellness exam [N39.76] 10/03/2014  . IBS (irritable bowel syndrome) [K58.9] 04/01/2014  . H/O total knee replacement [Z96.659] 04/22/2013  . Essential hypertension [I10] 04/15/2013  . Vitamin D deficiency [E55.9] 04/15/2013  . Encounter for long-term (current) use of medications [Z79.899] 04/15/2013  . Prediabetes [R73.03] 04/15/2013  . Osteoarthritis [M19.90] 01/31/2011  . Hyperlipemia [E78.5]   .  Glaucoma [H40.9]   . Chronic pain disorder [G89.4]   . Chronic back pain [M54.9, G89.29]     Total Time spent with patient: 45 minutes  Subjective:   Destiny Hughes is a 71 y.o. female patient admitted with disorganized behavior and psychosis.  HPI:  Patient with history of Depression, anxiety and Dementia. Patient was IVC'd by her family due to worsening depression, psychosis, disorganized behavior due to not sleeping for days. Patient states that she has been seeing stuffs,  hears buzzing sound and bells ringing. Patient has been observed talking to herself, she appears to be out of touch with reality. Patient appears calm and pleasant this morning but nurses reports that she was combative last night-probably sundowning.  Past Psychiatric History: as above  Risk to Self: Suicidal Ideation: No Suicidal Intent: No Is patient at risk for suicide?: No (No ) Suicidal Plan?: No Access to Means: No What has been your use of drugs/alcohol within the last 12 months?:  (none reported) How many times?:  (0) Other Self Harm Risks:  (no self harm risks) Triggers for Past Attempts:  (no past attempts or gestures ) Intentional Self Injurious Behavior: None Risk to Others: Homicidal Ideation: No Thoughts of Harm to Others: No Current Homicidal Intent: No Current Homicidal Plan: No Access to Homicidal Means: No Identified Victim:  (n/a) History of harm to others?: No Assessment of Violence: None Noted Violent Behavior Description:  (Patient is calm and cooperative ) Does patient have access to weapons?: No Criminal Charges Pending?: No Does patient have a court date: No Prior Inpatient Therapy: Prior Inpatient Therapy: Yes Prior Therapy Dates:  (n/a) Prior Therapy Facilty/Provider(s):  [facility]) Reason for Treatment:  (n/a) Prior Outpatient Therapy: Prior Outpatient Therapy: No Prior Therapy Dates:  (n/a) Prior Therapy  Facilty/Provider(s):  (n/a) Reason for Treatment:  (n/a) Does  patient have an ACCT team?: No Does patient have Intensive In-House Services?  : No Does patient have [facility] services? : No Does patient have P4CC services?: No  Past Medical History:  Past Medical History:  Diagnosis Date  . Chronic back pain   . Collapse of right lung   . COPD (chronic obstructive pulmonary disease) (HCC)    Hx. Bronchitis occ, 01-25-11 somes issue now taking  Z-pak-started 01-24-11/Scar tissue  present  from previous lung collapse  . Depression   . GERD (gastroesophageal reflux disease)    tx. TUMS  . Glaucoma   . Hyperlipemia   . Hypothyroidism    tx. Levothyroxine  . Nephrolithiasis   . Neuromuscular disorder ([place]) 01-25-11   Pain/nerve stimulator implanted -2 yrs ago.    Past Surgical History:  Procedure Laterality Date  . A-FLUTTER ABLATION N/A 08/16/2016   Procedure: A-Flutter Ablation;  Surgeon: Evans Lance, MD;  Location: [place] [facility];  Service: Cardiovascular;  Laterality: N/A;  . APPENDECTOMY  01-25-11  . CARPAL TUNNEL RELEASE  01-25-11   Bil.  . CERVICAL SPINE SURGERY  01-25-11   2005-multiple levels  . CHOLECYSTECTOMY  01-25-11   '07  . COLONOSCOPY WITH PROPOFOL N/A 07/23/2012   Procedure: COLONOSCOPY WITH PROPOFOL;  Surgeon: Garlan Fair, MD;  Location: [facility];  Service: Endoscopy;  Laterality: N/A;  . EYE SURGERY    . JOINT REPLACEMENT  01-25-11   6'03 LTHA-hemi  . KNEE ARTHROPLASTY  01-25-11   '04-right, revised x2  . SPINAL CORD STIMULATOR IMPLANT  2009 APPROX   DR. ELSNER  . THORACIC SPINE SURGERY  01-25-11   6'02- then nerve stimulator implanted after  . TOTAL KNEE REVISION  02/01/2011   Procedure: TOTAL KNEE REVISION;  Surgeon: Mauri Pole;  Location: [facility];  Service: Orthopedics;  Laterality: Right;  . TUBAL LIGATION     Family History:  Family History  Problem Relation Age of Onset  . Heart attack Father   . Emphysema Father   . Aneurysm Mother        CEREBRAL  . Emphysema Mother   . Dementia Mother   .  Diabetes Son   . Hypertension Son   . Hypertension Son   . Cancer Sister        Widely Metastatic  . Asthma Son        x2  . Breast cancer Paternal Aunt   . Rheum arthritis Maternal Aunt    Family Psychiatric  History:  Social History:  History  Alcohol Use No    Comment: none in 10 years     History  Drug Use No    Social History   Social History  . Marital status: Married    Spouse name: N/A  . Number of children: 2  . Years of education: N/A   Occupational History  . disabled    Social History Main Topics  . Smoking status: Current Every Day Smoker    Packs/day: 0.75    Years: 50.00    Types: Cigarettes    Start date: 09/13/1961  . Smokeless tobacco: Never Used     Comment: Decreased Down to 2/3 a Day if Thinking Abouit It  . Alcohol use No     Comment: none in 10 years  . Drug use: No  . Sexual activity: No   Other Topics Concern  . None   Social History Narrative  She is from [facility]. She has always lived in [place]. She has traveled to [place], [place], [place], [place], & [place]. 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 Prior exposure to parakeets with last exposure remote in her current home. Previously had mold in her home that was in her bathroom & fixed. Prior exposure to hot tubs but none recently. Pt lives at home with [name], husband.  Has 2 childrean, 12th grade education.     Additional Social History:    Allergies:   Allergies  Allergen Reactions  . Albuterol Other (See Comments)    Had difficulty breathing after a nebulizer treatment  . Penicillins Anaphylaxis and Swelling    Has patient had a PCN reaction causing immediate rash, facial/tongue/throat swelling, SOB or lightheadedness with hypotension: Yes Has patient had a PCN reaction causing severe rash involving mucus membranes or skin necrosis: Rash Has patient had a PCN reaction that required  hospitalization: No Has patient had a PCN reaction occurring within the last 10 years: No If all of the above answers are "NO", then may proceed with Cephalosporin use.  [name] [Kdc:Albuterol] Shortness Of Breath  . Sulfa Antibiotics Swelling    Throat swells, but no shortness of breath noted  . Zanaflex [Tizanidine] Other (See Comments)    Possible confusion (??)  . Codeine Nausea And Vomiting  . Ecotrin [Aspirin] Nausea And Vomiting    Labs:  Results for orders placed or performed during the hospital encounter of 10/19/16 (from the past 48 hour(s))  Comprehensive metabolic panel     Status: Abnormal   Collection Time: 10/19/16  4:55 AM  Result Value Ref Range   Sodium 128 (L) 135 - 145 mmol/L   Potassium 4.0 3.5 - 5.1 mmol/L   Chloride 91 (L) 101 - 111 mmol/L   CO2 26 22 - 32 mmol/L   Glucose, Bld 138 (H) 65 - 99 mg/dL   BUN 13 6 - 20 mg/dL   Creatinine, Ser 0.81 0.44 - 1.00 mg/dL   Calcium 9.9 8.9 - 10.3 mg/dL   Total Protein 7.0 6.5 - 8.1 g/dL   Albumin 4.3 3.5 - 5.0 g/dL   AST 20 15 - 41 U/L   ALT 16 14 - 54 U/L   Alkaline Phosphatase 77 38 - 126 U/L   Total Bilirubin 0.7 0.3 - 1.2 mg/dL   GFR calc non Af Amer >60 >60 mL/min   GFR calc Af Amer >60 >60 mL/min    Comment: (NOTE) The eGFR has been calculated using the CKD EPI equation. This calculation has not been validated in all clinical situations. eGFR's persistently <60 mL/min signify possible Chronic Kidney Disease.    Anion gap 11 5 - 15  Ethanol     Status: None   Collection Time: 10/19/16  4:55 AM  Result Value Ref Range   Alcohol, Ethyl (B) <5 <5 mg/dL    Comment:        LOWEST DETECTABLE LIMIT FOR SERUM ALCOHOL IS 5 mg/dL FOR MEDICAL PURPOSES ONLY   CBC with Diff     Status: Abnormal   Collection Time: 10/19/16  4:55 AM  Result Value Ref Range   WBC 9.4 4.0 - 10.5 K/uL   RBC 4.28 3.87 - 5.11 MIL/uL   Hemoglobin 13.4 12.0 - 15.0 g/dL   HCT 37.0 36.0 - 46.0 %   MCV 86.4 78.0 - 100.0  fL   MCH  31.3 26.0 - 34.0 pg   MCHC 36.2 (H) 30.0 - 36.0 g/dL   RDW 13.5 11.5 - 15.5 %   Platelets 442 (H) 150 - 400 K/uL   Neutrophils Relative % 69 %   Neutro Abs 6.5 1.7 - 7.7 K/uL   Lymphocytes Relative 24 %   Lymphs Abs 2.3 0.7 - 4.0 K/uL   Monocytes Relative 6 %   Monocytes Absolute 0.6 0.1 - 1.0 K/uL   Eosinophils Relative 1 %   Eosinophils Absolute 0.1 0.0 - 0.7 K/uL   Basophils Relative 0 %   Basophils Absolute 0.0 0.0 - 0.1 K/uL  Urine rapid drug screen (hosp performed)     Status: None   Collection Time: 10/19/16  6:19 AM  Result Value Ref Range   Opiates NONE DETECTED NONE DETECTED   Cocaine NONE DETECTED NONE DETECTED   Benzodiazepines NONE DETECTED NONE DETECTED   Amphetamines NONE DETECTED NONE DETECTED   Tetrahydrocannabinol NONE DETECTED NONE DETECTED   Barbiturates NONE DETECTED NONE DETECTED    Comment:        DRUG SCREEN FOR MEDICAL PURPOSES ONLY.  IF CONFIRMATION IS NEEDED FOR ANY PURPOSE, NOTIFY LAB WITHIN 5 DAYS.        LOWEST DETECTABLE LIMITS FOR URINE DRUG SCREEN Drug Class       Cutoff (ng/mL) Amphetamine      1000 Barbiturate      200 Benzodiazepine   570 Tricyclics       177 Opiates          300 Cocaine          300 THC              50   Urinalysis, Complete w Microscopic     Status: Abnormal   Collection Time: 10/19/16  9:17 AM  Result Value Ref Range   Color, Urine STRAW (A) YELLOW   APPearance CLEAR CLEAR   Specific Gravity, Urine 1.005 1.005 - 1.030   pH 6.0 5.0 - 8.0   Glucose, UA NEGATIVE NEGATIVE mg/dL   Hgb urine dipstick NEGATIVE NEGATIVE   Bilirubin Urine NEGATIVE NEGATIVE   Ketones, ur NEGATIVE NEGATIVE mg/dL   Protein, ur NEGATIVE NEGATIVE mg/dL   Nitrite NEGATIVE NEGATIVE   Leukocytes, UA TRACE (A) NEGATIVE   RBC / HPF 0-5 0 - 5 RBC/hpf   WBC, UA 0-5 0 - 5 WBC/hpf   Bacteria, UA RARE (A) NONE SEEN   Squamous Epithelial / LPF 0-5 (A) NONE SEEN   Mucous PRESENT     Current Facility-Administered Medications  Medication Dose  Route Frequency Provider Last Rate Last Dose  . acetaminophen (TYLENOL) tablet 650 mg  650 mg Oral Q4H PRN Palumbo, April, MD      . alum & mag hydroxide-simeth (MAALOX/MYLANTA) 200-200-20 MG/5ML suspension 30 mL  30 mL Oral Q6H PRN Palumbo, April, MD      . apixaban (ELIQUIS) tablet 5 mg  5 mg Oral BID Corena Pilgrim, MD   5 mg at 10/20/16 0928  . asenapine (SAPHRIS) sublingual tablet 5 mg  5 mg Sublingual BID Darleene Cleaver, Xue Low, MD   5 mg at 10/20/16 0927  . donepezil (ARICEPT) tablet 5 mg  5 mg Oral QHS Keyleigh Manninen, MD   5 mg at 10/19/16 2145  . feeding supplement (ENSURE ENLIVE) (ENSURE ENLIVE) liquid 237 mL  237 mL Oral BID BM Shelisa Fern, MD   237 mL at 10/20/16 0928  . lamoTRIgine (LAMICTAL) tablet 25  mg  25 mg Oral Daily Corena Pilgrim, MD   25 mg at 10/20/16 0929  . levothyroxine (SYNTHROID, LEVOTHROID) tablet 100 mcg  100 mcg Oral QAC breakfast Corena Pilgrim, MD   100 mcg at 10/20/16 0823  . lisinopril (PRINIVIL,ZESTRIL) tablet 5 mg  5 mg Oral Daily Steffani Dionisio, MD   5 mg at 10/20/16 0927  . nicotine (NICODERM CQ - dosed in mg/24 hours) patch 21 mg  21 mg Transdermal Daily Palumbo, April, MD   21 mg at 10/20/16 9798  . pantoprazole (PROTONIX) EC tablet 40 mg  40 mg Oral BID Corena Pilgrim, MD   40 mg at 10/20/16 9211   Current Outpatient Prescriptions  Medication Sig Dispense Refill  . apixaban (ELIQUIS) 5 MG TABS tablet Take 1 tablet (5 mg total) by mouth 2 (two) times daily. 60 tablet 6  . donepezil (ARICEPT) 5 MG tablet Take 1 tablet (5 mg total) by mouth at bedtime. 30 tablet 0  . gabapentin (NEURONTIN) 100 MG capsule Take 1 capsule (100 mg total) by mouth 2 (two) times daily. 60 capsule 0  . levalbuterol (XOPENEX HFA) 45 MCG/ACT inhaler Inhale 2 puffs into the lungs every 4 (four) hours as needed for wheezing. 1 Inhaler 12  . levothyroxine (SYNTHROID) 100 MCG tablet Take 1 tablet (100 mcg total) by mouth daily before breakfast. 90 tablet 3  . lisinopril  (PRINIVIL,ZESTRIL) 5 MG tablet Take 1 tablet (5 mg total) by mouth daily. 30 tablet 11  . magnesium oxide (MAG-OX) 400 MG tablet Take 1 tablet (400 mg total) by mouth daily. (Patient taking differently: Take 400 mg by mouth daily as needed (nausea or reflux). ) 30 tablet 3  . nicotine (NICODERM CQ - DOSED IN MG/24 HOURS) 21 mg/24hr patch Place 1 patch (21 mg total) onto the skin daily. 28 patch 0  . pantoprazole (PROTONIX) 40 MG tablet Take 1 tablet (40 mg total) by mouth 2 (two) times daily. 60 tablet 0  . venlafaxine XR (EFFEXOR-XR) 37.5 MG 24 hr capsule Take 1 capsule (37.5 mg total) by mouth daily with breakfast. 30 capsule 0  . zolpidem (AMBIEN) 5 MG tablet Take 1 tablet (5 mg total) by mouth at bedtime as needed for sleep. 30 tablet 0    Musculoskeletal: Strength & Muscle Tone: within normal limits Gait & Station: normal Patient leans: N/A  Psychiatric Specialty Exam: Physical Exam  Psychiatric: Thought content normal. Her mood appears anxious. Her speech is delayed. She is agitated, actively hallucinating and combative. Cognition and memory are impaired. She expresses impulsivity. She exhibits a depressed mood.    Review of Systems  Constitutional: Negative.   HENT: Negative.   Eyes: Negative.   Respiratory: Negative.   Gastrointestinal: Negative.   Skin: Negative.   Neurological: Negative.   Endo/Heme/Allergies: Negative.   Psychiatric/Behavioral: Positive for depression, hallucinations and memory loss. The patient is nervous/anxious and has insomnia.     Blood pressure (!) 143/68, pulse 94, temperature 98 F (36.7 C), temperature source Oral, resp. rate 15, height '5\' 6"'  (1.676 m), weight 65.8 kg (145 lb), SpO2 96 %.Body mass index is 23.4 kg/m.  General Appearance: Casual  Eye Contact:  Fair  Speech:  Clear and Coherent  Volume:  Decreased  Mood:  Anxious, Depressed and Dysphoric  Affect:  Constricted  Thought Process:  Disorganized  Orientation:  Other:  only to person  and place  Thought Content:  Illogical  Suicidal Thoughts:  No  Homicidal Thoughts:  No  Memory:  Immediate;  Fair Recent;   Poor Remote;   Poor  Judgement:  Impaired  Insight:  Lacking  Psychomotor Activity:  Restlessness  Concentration:  Concentration: Fair and Attention Span: Fair  Recall:  Poor  Fund of Knowledge:  Poor  Language:  Good  Akathisia:  No  Handed:  Right  AIMS (if indicated):     Assets:  Communication Skills Social Support Others:  family  ADL's:  Impaired  Cognition:  Impaired,  Moderate  Sleep:   poor     Treatment Plan Summary: Daily contact with patient to assess and evaluate symptoms and progress in treatment and Medication management Continue Saphris 5 mg qhs for psychosis and Lamictal 25 mg qam for agitation.  Disposition: Recommend psychiatric Inpatient admission when medically cleared.  Corena Pilgrim, MD 10/20/2016 10:39 AM

## 2016-10-20 NOTE — ED Notes (Signed)
Pt provided diet coke.  Pt drank 2-3 sips & c/o nausea.  Emesis bag provided.

## 2016-10-20 NOTE — Progress Notes (Signed)
Patient is calm and cooperative at this time. Will continue to monitor patient.

## 2016-10-20 NOTE — BH Assessment (Signed)
[place] Assessment Progress Note  Per Corena Pilgrim, MD, this pt continues to require psychiatric hospitalization at this time.  The following facilities have been contacted to seek placement for this pt, with results as noted:  Beds available, information sent, decision pending:  [place]:  [facility]   Additionally, pt is still under consideration at [facility], with decision pending.   Jalene Mullet, [place] Triage Specialist [phone]

## 2016-10-21 ENCOUNTER — Other Ambulatory Visit: Payer: Self-pay

## 2016-10-21 NOTE — Patient Outreach (Signed)
[facility]) Care Management  10/21/2016  Destiny Hughes Batch 07-20-1945 [id number]   This RNCM arrived for home visit for scheduled home viist. Patient and husband were not at home, this RNCM was advised by family friend patient had been admitted to behavior health hospital in [place], [place].  Plan: Make attempt to re-engage once patient is discharged back home.

## 2016-10-23 DIAGNOSIS — Z720 Tobacco use: Secondary | ICD-10-CM | POA: Insufficient documentation

## 2016-10-23 DIAGNOSIS — R0789 Other chest pain: Secondary | ICD-10-CM | POA: Insufficient documentation

## 2016-11-03 ENCOUNTER — Other Ambulatory Visit: Payer: Self-pay

## 2016-11-03 DIAGNOSIS — J441 Chronic obstructive pulmonary disease with (acute) exacerbation: Secondary | ICD-10-CM | POA: Diagnosis not present

## 2016-11-03 DIAGNOSIS — J449 Chronic obstructive pulmonary disease, unspecified: Secondary | ICD-10-CM | POA: Diagnosis not present

## 2016-11-03 DIAGNOSIS — Z882 Allergy status to sulfonamides status: Secondary | ICD-10-CM | POA: Diagnosis not present

## 2016-11-03 DIAGNOSIS — Z888 Allergy status to other drugs, medicaments and biological substances status: Secondary | ICD-10-CM | POA: Diagnosis not present

## 2016-11-03 DIAGNOSIS — K219 Gastro-esophageal reflux disease without esophagitis: Secondary | ICD-10-CM | POA: Diagnosis not present

## 2016-11-03 DIAGNOSIS — Z66 Do not resuscitate: Secondary | ICD-10-CM | POA: Diagnosis present

## 2016-11-03 DIAGNOSIS — I10 Essential (primary) hypertension: Secondary | ICD-10-CM | POA: Diagnosis not present

## 2016-11-03 DIAGNOSIS — Z885 Allergy status to narcotic agent status: Secondary | ICD-10-CM | POA: Diagnosis not present

## 2016-11-03 DIAGNOSIS — R0602 Shortness of breath: Secondary | ICD-10-CM | POA: Diagnosis not present

## 2016-11-03 DIAGNOSIS — J9601 Acute respiratory failure with hypoxia: Secondary | ICD-10-CM | POA: Diagnosis not present

## 2016-11-03 DIAGNOSIS — J9811 Atelectasis: Secondary | ICD-10-CM | POA: Diagnosis not present

## 2016-11-03 DIAGNOSIS — Z886 Allergy status to analgesic agent status: Secondary | ICD-10-CM | POA: Diagnosis not present

## 2016-11-03 DIAGNOSIS — J9602 Acute respiratory failure with hypercapnia: Secondary | ICD-10-CM | POA: Diagnosis not present

## 2016-11-03 DIAGNOSIS — F0391 Unspecified dementia with behavioral disturbance: Secondary | ICD-10-CM | POA: Diagnosis not present

## 2016-11-03 DIAGNOSIS — E039 Hypothyroidism, unspecified: Secondary | ICD-10-CM | POA: Diagnosis not present

## 2016-11-03 DIAGNOSIS — I4891 Unspecified atrial fibrillation: Secondary | ICD-10-CM | POA: Diagnosis present

## 2016-11-03 DIAGNOSIS — R0603 Acute respiratory distress: Secondary | ICD-10-CM | POA: Diagnosis not present

## 2016-11-03 DIAGNOSIS — I700 Atherosclerosis of aorta: Secondary | ICD-10-CM | POA: Diagnosis not present

## 2016-11-03 DIAGNOSIS — I081 Rheumatic disorders of both mitral and tricuspid valves: Secondary | ICD-10-CM | POA: Diagnosis not present

## 2016-11-03 DIAGNOSIS — I482 Chronic atrial fibrillation: Secondary | ICD-10-CM | POA: Diagnosis not present

## 2016-11-03 DIAGNOSIS — Z880 Allergy status to penicillin: Secondary | ICD-10-CM | POA: Diagnosis not present

## 2016-11-03 DIAGNOSIS — E871 Hypo-osmolality and hyponatremia: Secondary | ICD-10-CM | POA: Diagnosis not present

## 2016-11-03 NOTE — Patient Outreach (Signed)
    Unsuccessful attempt made to contact patient and her caregiver by telephone for community care coordination. Also, case presented during Difficult Case Discussion. Recommendation: Attempt contact, attempt to re-engage once patient is discharged to the community

## 2016-11-07 DIAGNOSIS — Z885 Allergy status to narcotic agent status: Secondary | ICD-10-CM | POA: Diagnosis not present

## 2016-11-07 DIAGNOSIS — Z880 Allergy status to penicillin: Secondary | ICD-10-CM | POA: Diagnosis not present

## 2016-11-07 DIAGNOSIS — Z882 Allergy status to sulfonamides status: Secondary | ICD-10-CM | POA: Diagnosis not present

## 2016-11-07 DIAGNOSIS — Z888 Allergy status to other drugs, medicaments and biological substances status: Secondary | ICD-10-CM | POA: Diagnosis not present

## 2016-11-07 DIAGNOSIS — Z720 Tobacco use: Secondary | ICD-10-CM | POA: Diagnosis not present

## 2016-11-07 DIAGNOSIS — M1611 Unilateral primary osteoarthritis, right hip: Secondary | ICD-10-CM | POA: Diagnosis not present

## 2016-11-07 DIAGNOSIS — J189 Pneumonia, unspecified organism: Secondary | ICD-10-CM | POA: Diagnosis not present

## 2016-11-07 DIAGNOSIS — G8929 Other chronic pain: Secondary | ICD-10-CM | POA: Diagnosis not present

## 2016-11-07 DIAGNOSIS — Z79899 Other long term (current) drug therapy: Secondary | ICD-10-CM | POA: Diagnosis not present

## 2016-11-07 DIAGNOSIS — M545 Low back pain: Secondary | ICD-10-CM | POA: Diagnosis not present

## 2016-11-07 DIAGNOSIS — F0281 Dementia in other diseases classified elsewhere with behavioral disturbance: Secondary | ICD-10-CM | POA: Diagnosis not present

## 2016-11-07 DIAGNOSIS — R079 Chest pain, unspecified: Secondary | ICD-10-CM | POA: Diagnosis not present

## 2016-11-07 DIAGNOSIS — F0391 Unspecified dementia with behavioral disturbance: Secondary | ICD-10-CM | POA: Diagnosis not present

## 2016-11-07 DIAGNOSIS — R0789 Other chest pain: Secondary | ICD-10-CM | POA: Diagnosis not present

## 2016-11-07 DIAGNOSIS — K219 Gastro-esophageal reflux disease without esophagitis: Secondary | ICD-10-CM | POA: Diagnosis not present

## 2016-11-07 DIAGNOSIS — G309 Alzheimer's disease, unspecified: Secondary | ICD-10-CM | POA: Diagnosis not present

## 2016-11-07 DIAGNOSIS — M549 Dorsalgia, unspecified: Secondary | ICD-10-CM | POA: Diagnosis present

## 2016-11-07 DIAGNOSIS — J984 Other disorders of lung: Secondary | ICD-10-CM | POA: Diagnosis not present

## 2016-11-07 DIAGNOSIS — I482 Chronic atrial fibrillation: Secondary | ICD-10-CM | POA: Diagnosis not present

## 2016-11-07 DIAGNOSIS — J449 Chronic obstructive pulmonary disease, unspecified: Secondary | ICD-10-CM | POA: Diagnosis not present

## 2016-11-07 DIAGNOSIS — F419 Anxiety disorder, unspecified: Secondary | ICD-10-CM | POA: Diagnosis present

## 2016-11-07 DIAGNOSIS — I10 Essential (primary) hypertension: Secondary | ICD-10-CM | POA: Diagnosis not present

## 2016-11-07 DIAGNOSIS — E871 Hypo-osmolality and hyponatremia: Secondary | ICD-10-CM | POA: Diagnosis not present

## 2016-11-07 DIAGNOSIS — N281 Cyst of kidney, acquired: Secondary | ICD-10-CM | POA: Diagnosis not present

## 2016-11-07 DIAGNOSIS — R42 Dizziness and giddiness: Secondary | ICD-10-CM | POA: Diagnosis not present

## 2016-11-07 DIAGNOSIS — R05 Cough: Secondary | ICD-10-CM | POA: Diagnosis not present

## 2016-11-07 DIAGNOSIS — F17210 Nicotine dependence, cigarettes, uncomplicated: Secondary | ICD-10-CM | POA: Diagnosis present

## 2016-11-07 DIAGNOSIS — Z886 Allergy status to analgesic agent status: Secondary | ICD-10-CM | POA: Diagnosis not present

## 2016-11-07 DIAGNOSIS — F329 Major depressive disorder, single episode, unspecified: Secondary | ICD-10-CM | POA: Diagnosis present

## 2016-11-07 DIAGNOSIS — E038 Other specified hypothyroidism: Secondary | ICD-10-CM | POA: Diagnosis not present

## 2016-11-07 DIAGNOSIS — J441 Chronic obstructive pulmonary disease with (acute) exacerbation: Secondary | ICD-10-CM | POA: Diagnosis not present

## 2016-11-07 DIAGNOSIS — J9601 Acute respiratory failure with hypoxia: Secondary | ICD-10-CM | POA: Diagnosis not present

## 2016-11-07 DIAGNOSIS — F29 Unspecified psychosis not due to a substance or known physiological condition: Secondary | ICD-10-CM | POA: Insufficient documentation

## 2016-11-07 DIAGNOSIS — I480 Paroxysmal atrial fibrillation: Secondary | ICD-10-CM | POA: Diagnosis not present

## 2016-11-07 DIAGNOSIS — F0390 Unspecified dementia without behavioral disturbance: Secondary | ICD-10-CM | POA: Diagnosis present

## 2016-11-07 DIAGNOSIS — J9602 Acute respiratory failure with hypercapnia: Secondary | ICD-10-CM | POA: Diagnosis not present

## 2016-11-07 DIAGNOSIS — R109 Unspecified abdominal pain: Secondary | ICD-10-CM | POA: Diagnosis not present

## 2016-11-07 DIAGNOSIS — N289 Disorder of kidney and ureter, unspecified: Secondary | ICD-10-CM | POA: Diagnosis not present

## 2016-11-07 DIAGNOSIS — E039 Hypothyroidism, unspecified: Secondary | ICD-10-CM | POA: Diagnosis present

## 2016-11-10 ENCOUNTER — Other Ambulatory Visit: Payer: Self-pay

## 2016-11-10 NOTE — Patient Outreach (Signed)
   This RNCM was successful, after several telephone attempts, in contacting patient's husand. Patient's husband advised this RNCM he has been going to see his wife who remains inpatient in [facility] Patient's husband added that the anticipated discharge for patient is Saturday, November 15, 2016. Patient states he would like for this [facility] RNCM to continue as patient's case manager. Patient's husband states that he thinks this RNCM assisting he and his wife with case management would help him.   This RNCM advises patient's husband she would make contact with them in the next 28 days for community care coordination.

## 2016-11-14 DIAGNOSIS — J449 Chronic obstructive pulmonary disease, unspecified: Secondary | ICD-10-CM | POA: Diagnosis not present

## 2016-11-14 DIAGNOSIS — I10 Essential (primary) hypertension: Secondary | ICD-10-CM | POA: Diagnosis not present

## 2016-11-14 DIAGNOSIS — F0391 Unspecified dementia with behavioral disturbance: Secondary | ICD-10-CM | POA: Diagnosis not present

## 2016-11-14 DIAGNOSIS — F29 Unspecified psychosis not due to a substance or known physiological condition: Secondary | ICD-10-CM | POA: Diagnosis not present

## 2016-11-14 DIAGNOSIS — M545 Low back pain: Secondary | ICD-10-CM | POA: Diagnosis not present

## 2016-11-14 DIAGNOSIS — I480 Paroxysmal atrial fibrillation: Secondary | ICD-10-CM | POA: Diagnosis not present

## 2016-11-15 ENCOUNTER — Encounter (HOSPITAL_COMMUNITY): Payer: Self-pay

## 2016-11-15 ENCOUNTER — Emergency Department (HOSPITAL_COMMUNITY)
Admission: EM | Admit: 2016-11-15 | Discharge: 2016-11-15 | Disposition: A | Discharge status: Home / Self Care | Payer: Medicare Other | Attending: Emergency Medicine | Admitting: Emergency Medicine

## 2016-11-15 DIAGNOSIS — Z880 Allergy status to penicillin: Secondary | ICD-10-CM | POA: Diagnosis not present

## 2016-11-15 DIAGNOSIS — Z885 Allergy status to narcotic agent status: Secondary | ICD-10-CM | POA: Insufficient documentation

## 2016-11-15 DIAGNOSIS — J449 Chronic obstructive pulmonary disease, unspecified: Secondary | ICD-10-CM | POA: Insufficient documentation

## 2016-11-15 DIAGNOSIS — Z7901 Long term (current) use of anticoagulants: Secondary | ICD-10-CM | POA: Insufficient documentation

## 2016-11-15 DIAGNOSIS — I5030 Unspecified diastolic (congestive) heart failure: Secondary | ICD-10-CM | POA: Diagnosis not present

## 2016-11-15 DIAGNOSIS — I110 Hypertensive heart disease with heart failure: Secondary | ICD-10-CM | POA: Diagnosis not present

## 2016-11-15 DIAGNOSIS — K625 Hemorrhage of anus and rectum: Secondary | ICD-10-CM | POA: Diagnosis not present

## 2016-11-15 DIAGNOSIS — Z79899 Other long term (current) drug therapy: Secondary | ICD-10-CM | POA: Insufficient documentation

## 2016-11-15 DIAGNOSIS — F17210 Nicotine dependence, cigarettes, uncomplicated: Secondary | ICD-10-CM | POA: Diagnosis not present

## 2016-11-15 DIAGNOSIS — E039 Hypothyroidism, unspecified: Secondary | ICD-10-CM | POA: Diagnosis not present

## 2016-11-15 LAB — CBC
HEMATOCRIT: 34 % — AB (ref 36.0–46.0)
HEMOGLOBIN: 11 g/dL — AB (ref 12.0–15.0)
MCH: 29.8 pg (ref 26.0–34.0)
MCHC: 32.4 g/dL (ref 30.0–36.0)
MCV: 92.1 fL (ref 78.0–100.0)
Platelets: 293 10*3/uL (ref 150–400)
RBC: 3.69 MIL/uL — ABNORMAL LOW (ref 3.87–5.11)
RDW: 13.7 % (ref 11.5–15.5)
WBC: 12.3 10*3/uL — ABNORMAL HIGH (ref 4.0–10.5)

## 2016-11-15 LAB — URINALYSIS, ROUTINE W REFLEX MICROSCOPIC
BILIRUBIN URINE: NEGATIVE
GLUCOSE, UA: NEGATIVE mg/dL
Ketones, ur: NEGATIVE mg/dL
Nitrite: NEGATIVE
Protein, ur: NEGATIVE mg/dL
SPECIFIC GRAVITY, URINE: 1.01 (ref 1.005–1.030)
pH: 8 (ref 5.0–8.0)

## 2016-11-15 LAB — COMPREHENSIVE METABOLIC PANEL
ALBUMIN: 3.3 g/dL — AB (ref 3.5–5.0)
ALK PHOS: 62 U/L (ref 38–126)
ALT: 13 U/L — ABNORMAL LOW (ref 14–54)
ANION GAP: 7 (ref 5–15)
AST: 17 U/L (ref 15–41)
BILIRUBIN TOTAL: 0.8 mg/dL (ref 0.3–1.2)
BUN: 12 mg/dL (ref 6–20)
CALCIUM: 8.9 mg/dL (ref 8.9–10.3)
CO2: 31 mmol/L (ref 22–32)
Chloride: 95 mmol/L — ABNORMAL LOW (ref 101–111)
Creatinine, Ser: 1.19 mg/dL — ABNORMAL HIGH (ref 0.44–1.00)
GFR calc Af Amer: 51 mL/min — ABNORMAL LOW (ref >60)
GFR calc non Af Amer: 44 mL/min — ABNORMAL LOW (ref >60)
GLUCOSE: 232 mg/dL — AB (ref 65–99)
Potassium: 4.1 mmol/L (ref 3.5–5.1)
SODIUM: 133 mmol/L — AB (ref 135–145)
Total Protein: 5.7 g/dL — ABNORMAL LOW (ref 6.5–8.1)

## 2016-11-15 LAB — URINALYSIS, MICROSCOPIC (REFLEX)

## 2016-11-15 LAB — POC OCCULT BLOOD, ED: FECAL OCCULT BLD: POSITIVE — AB

## 2016-11-15 MED ORDER — HYDROCORTISONE ACETATE 25 MG RE SUPP: 25.0000 mg | Freq: Two times a day (BID) | RECTAL | 0 refills | Status: DC

## 2016-11-15 NOTE — ED Provider Notes (Signed)
[facility] DEPT Provider Note   CSN: [id number] Arrival date & time: 11/15/16  1324     History   Chief Complaint No chief complaint on file.   HPI Destiny Hughes is a 71 y.o. female with a pmh of dementia with psychotic episodes, recently released after inpatient admission for psychosis. She presents today with rectal bleeding. The patient has some short-term memory issues. Her husband answers questions states that she seemed to be straining this morning to make a bowel movement. He notes that the foot was filled with blood. She is complaining of suprapubic pain and also notes that she's had some burning with urination. She denies any rectal urgency. He states that she had stool and blood. She denies recent severe constipation. She does not take any opiate medications.  HPI  Past Medical History:  Diagnosis Date  . Chronic back pain   . Collapse of right lung   . COPD (chronic obstructive pulmonary disease) (HCC)    Hx. Bronchitis occ, 01-25-11 somes issue now taking  Z-pak-started 01-24-11/Scar tissue  present  from previous lung collapse  . Depression   . GERD (gastroesophageal reflux disease)    tx. TUMS  . Glaucoma   . Hyperlipemia   . Hypothyroidism    tx. Levothyroxine  . Nephrolithiasis   . Neuromuscular disorder ([place]) 01-25-11   Pain/nerve stimulator implanted -2 yrs ago.    Patient Active Problem List   Diagnosis Date Noted  . Major depression with psychotic features ([place]) 10/19/2016  . Dementia with psychosis 10/19/2016  . Depression due to physical illness   . Cognitive retention disorder   . Atrial flutter ([place]) 08/15/2016  . Atrial fibrillation with RVR ([place]) 08/15/2016  . SVT (supraventricular tachycardia) ([place]) 07/11/2016  . Weight loss 07/08/2016  . AKI (acute kidney injury) ([place]) 07/02/2016  . Acute kidney injury ([place]) 07/01/2016  . Hyperglycemia 07/01/2016  . Hyponatremia 07/01/2016  . Intractable nausea and vomiting 07/01/2016  . Abnormal  EKG 07/01/2016  . Diastolic CHF ([place]) 40/12/2723  . Medication management 04/28/2015  . Emphysema lung ([place]) 12/26/2014  . GERD (gastroesophageal reflux disease) 12/26/2014  . Chronic bronchitis ([place]) 11/13/2014  . COPD (chronic obstructive pulmonary disease) ([place]) 10/03/2014  . At high risk for falls 10/03/2014  . Hypothyroidism 10/03/2014  . Mitral valve prolapse 10/03/2014  . Encounter for Medicare annual wellness exam 10/03/2014  . IBS (irritable bowel syndrome) 04/01/2014  . H/O total knee replacement 04/22/2013  . Essential hypertension 04/15/2013  . Vitamin D deficiency 04/15/2013  . Encounter for long-term (current) use of medications 04/15/2013  . Prediabetes 04/15/2013  . Osteoarthritis 01/31/2011  . Hyperlipemia   . Glaucoma   . Chronic pain disorder   . Chronic back pain     Past Surgical History:  Procedure Laterality Date  . A-FLUTTER ABLATION N/A 08/16/2016   Procedure: A-Flutter Ablation;  Surgeon: Evans Lance, MD;  Location: [facility];  Service: Cardiovascular;  Laterality: N/A;  . APPENDECTOMY  01-25-11  . CARPAL TUNNEL RELEASE  01-25-11   [place].  . CERVICAL SPINE SURGERY  01-25-11   2005-multiple levels  . CHOLECYSTECTOMY  01-25-11   '07  . COLONOSCOPY WITH PROPOFOL N/A 07/23/2012   Procedure: COLONOSCOPY WITH PROPOFOL;  Surgeon: Garlan Fair, MD;  Location: [facility];  Service: Endoscopy;  Laterality: N/A;  . EYE SURGERY    . JOINT REPLACEMENT  01-25-11   6'03 LTHA-hemi  . KNEE ARTHROPLASTY  01-25-11   '04-right, revised x2  .  SPINAL CORD STIMULATOR IMPLANT  2009 APPROX   DR. ELSNER  . THORACIC SPINE SURGERY  01-25-11   6'02- then nerve stimulator implanted after  . TOTAL KNEE REVISION  02/01/2011   Procedure: TOTAL KNEE REVISION;  Surgeon: Mauri Pole;  Location: [facility];  Service: Orthopedics;  Laterality: Right;  . TUBAL LIGATION      OB History    No data available       Home Medications    Prior to Admission medications     Medication Sig Start Date End Date Taking? Authorizing Provider  apixaban (ELIQUIS) 5 MG TABS tablet Take 1 tablet (5 mg total) by mouth 2 (two) times daily. 08/09/16   Sherran Needs, NP  donepezil (ARICEPT) 5 MG tablet Take 1 tablet (5 mg total) by mouth at bedtime. 10/12/16   Mikhail, Velta Addison, DO  gabapentin (NEURONTIN) 100 MG capsule Take 1 capsule (100 mg total) by mouth 2 (two) times daily. 10/12/16   Cristal Ford, DO  levalbuterol [facility] HFA) 45 MCG/ACT inhaler Inhale 2 puffs into the lungs every 4 (four) hours as needed for wheezing. 09/22/16   Pixie Casino, MD  levothyroxine (SYNTHROID) 100 MCG tablet Take 1 tablet (100 mcg total) by mouth daily before breakfast. 09/26/16   Evans Lance, MD  lisinopril (PRINIVIL,ZESTRIL) 5 MG tablet Take 1 tablet (5 mg total) by mouth daily. 08/31/16   Skeet Latch, MD  magnesium oxide (MAG-OX) 400 MG tablet Take 1 tablet (400 mg total) by mouth daily. Patient taking differently: Take 400 mg by mouth daily as needed (nausea or reflux).  09/07/16   Erlene Quan, PA-C  nicotine (NICODERM CQ - DOSED IN MG/24 HOURS) 21 mg/24hr patch Place 1 patch (21 mg total) onto the skin daily. 10/13/16   Mikhail, Velta Addison, DO  pantoprazole (PROTONIX) 40 MG tablet Take 1 tablet (40 mg total) by mouth 2 (two) times daily. 07/14/16   Ghimire, Henreitta Leber, MD  venlafaxine XR (EFFEXOR-XR) 37.5 MG 24 hr capsule Take 1 capsule (37.5 mg total) by mouth daily with breakfast. 10/13/16   Cristal Ford, DO  zolpidem (AMBIEN) 5 MG tablet Take 1 tablet (5 mg total) by mouth at bedtime as needed for sleep. 10/12/16   Cristal Ford, DO    Family History Family History  Problem Relation Age of Onset  . Heart attack Father   . Emphysema Father   . Aneurysm Mother        CEREBRAL  . Emphysema Mother   . Dementia Mother   . Diabetes Son   . Hypertension Son   . Hypertension Son   . Cancer Sister        Widely Metastatic  . Asthma Son        x2  . Breast cancer  Paternal Aunt   . Rheum arthritis Maternal Aunt     Social History Social History  Substance Use Topics  . Smoking status: Current Every Day Smoker    Packs/day: 0.75    Years: 50.00    Types: Cigarettes    Start date: 09/13/1961  . Smokeless tobacco: Never Used     Comment: Decreased Down to 2/3 a Day if Thinking [place] It  . Alcohol use No     Comment: none in 10 years     Allergies   Albuterol; Penicillins; Ventolin [kdc:albuterol]; Sulfa antibiotics; Zanaflex [tizanidine]; Codeine; and Ecotrin [aspirin]   Review of Systems Review of Systems Ten systems reviewed and are negative for acute change, except  as noted in the HPI.    Physical Exam Updated Vital Signs BP 127/65   Pulse 74   Temp 98.5 F (36.9 C) (Oral)   Resp 16   Ht 5\' 6"  (1.676 m)   Wt 59 kg (130 lb)   SpO2 99%   BMI 20.98 kg/m   Physical Exam  Constitutional: She is oriented to person, place, and time. She appears well-developed and well-nourished. No distress.  HENT:  Head: Normocephalic and atraumatic.  Eyes: Conjunctivae are normal. No scleral icterus.  Neck: Normal range of motion.  Cardiovascular: Normal rate, regular rhythm and normal heart sounds.  Exam reveals no gallop and no friction rub.   No murmur heard. Pulmonary/Chest: Effort normal and breath sounds normal. No respiratory distress.  Abdominal: Soft. Bowel sounds are normal. She exhibits no distension and no mass. There is no tenderness. There is no guarding.  Genitourinary:  Genitourinary Comments: Digital Rectal Exam reveals sphincter with good tone. Non thrombosed external hemorrhoids. No masses or fissures.No pain on rectal exam. Stool color is brown with no overt blood.   Neurological: She is alert and oriented to person, place, and time.  Skin: Skin is warm and dry. She is not diaphoretic.  Psychiatric: Her behavior is normal.  Nursing note and vitals reviewed.    ED Treatments / Results  Labs (all labs ordered are  listed, but only abnormal results are displayed) Labs Reviewed  COMPREHENSIVE METABOLIC PANEL - Abnormal; Notable for the following:       Result Value   Sodium 133 (*)    Chloride 95 (*)    Glucose, Bld 232 (*)    Creatinine, Ser 1.19 (*)    Total Protein 5.7 (*)    Albumin 3.3 (*)    ALT 13 (*)    GFR calc non Af Amer 44 (*)    GFR calc Af Amer 51 (*)    All other components within normal limits  CBC - Abnormal; Notable for the following:    WBC 12.3 (*)    RBC 3.69 (*)    Hemoglobin 11.0 (*)    HCT 34.0 (*)    All other components within normal limits  POC OCCULT BLOOD, ED - Abnormal; Notable for the following:    Fecal Occult Bld POSITIVE (*)    All other components within normal limits  URINALYSIS, ROUTINE W REFLEX MICROSCOPIC    EKG  EKG Interpretation None       Radiology No results found.  Procedures Procedures (including critical care time)  Medications Ordered in ED Medications - No data to display   Initial Impression / Assessment and Plan / ED Course  I have reviewed the triage vital signs and the nursing notes.  Pertinent labs & imaging results that were available during my care of the patient were reviewed by me and considered in my medical decision making (see chart for details).     Patient with bleeding from her rectum. No active bleeding on my examination. She was occult stool positive. Negative urinalysis, no abdominal tenderness on my examination. I suspect bleeding internal hemorrhoids. She'll be discharged with Anusol. I discussed reasons to seek immediate medical care with the family who agrees with plan of care. She appears safe for discharge at this time.  Final Clinical Impressions(s) / ED Diagnoses   Final diagnoses:  Rectal bleeding    New Prescriptions New Prescriptions   No medications on file     Margarita Mail, PA-C 11/15/16 1628  Lajean Saver, MD 11/18/16 [phone]

## 2016-11-15 NOTE — Discharge Instructions (Signed)
Get help right away if:  You have new or increased rectal bleeding.  You have black or dark red stools.  You vomit blood or something that looks like coffee grounds.  You have pain or tenderness in your abdomen.  You have a fever.  You feel weak.  You feel nauseous.  You faint.  You have severe pain in your rectum.  You cannot have a bowel movement.

## 2016-11-15 NOTE — ED Triage Notes (Signed)
Patient just released from [facility] for [facility] and has developed rectal pain with reported bright red rectal bleeding. Spouse also reports hemorrhoid for several days and thinks patient may be impacted

## 2016-11-15 NOTE — ED Notes (Signed)
Pt had large bloody stool in bathroom per family

## 2016-11-17 ENCOUNTER — Other Ambulatory Visit: Payer: Self-pay

## 2016-11-17 DIAGNOSIS — E871 Hypo-osmolality and hyponatremia: Secondary | ICD-10-CM | POA: Diagnosis not present

## 2016-11-17 DIAGNOSIS — J189 Pneumonia, unspecified organism: Secondary | ICD-10-CM | POA: Diagnosis not present

## 2016-11-17 DIAGNOSIS — K921 Melena: Secondary | ICD-10-CM | POA: Diagnosis not present

## 2016-11-17 DIAGNOSIS — F0391 Unspecified dementia with behavioral disturbance: Secondary | ICD-10-CM | POA: Diagnosis not present

## 2016-11-20 ENCOUNTER — Emergency Department (HOSPITAL_COMMUNITY): Payer: Medicare Other

## 2016-11-20 ENCOUNTER — Encounter (HOSPITAL_COMMUNITY): Payer: Self-pay | Admitting: Emergency Medicine

## 2016-11-20 ENCOUNTER — Emergency Department (HOSPITAL_COMMUNITY)
Admission: EM | Admit: 2016-11-20 | Discharge: 2016-11-26 | Disposition: A | Discharge status: Home / Self Care | Payer: Medicare Other | Attending: Emergency Medicine | Admitting: Emergency Medicine

## 2016-11-20 DIAGNOSIS — Z810 Family history of intellectual disabilities: Secondary | ICD-10-CM | POA: Diagnosis not present

## 2016-11-20 DIAGNOSIS — F323 Major depressive disorder, single episode, severe with psychotic features: Secondary | ICD-10-CM | POA: Diagnosis present

## 2016-11-20 DIAGNOSIS — E785 Hyperlipidemia, unspecified: Secondary | ICD-10-CM | POA: Insufficient documentation

## 2016-11-20 DIAGNOSIS — R443 Hallucinations, unspecified: Secondary | ICD-10-CM | POA: Diagnosis not present

## 2016-11-20 DIAGNOSIS — F0631 Mood disorder due to known physiological condition with depressive features: Secondary | ICD-10-CM | POA: Diagnosis not present

## 2016-11-20 DIAGNOSIS — F0391 Unspecified dementia with behavioral disturbance: Secondary | ICD-10-CM | POA: Diagnosis not present

## 2016-11-20 DIAGNOSIS — I5032 Chronic diastolic (congestive) heart failure: Secondary | ICD-10-CM | POA: Diagnosis not present

## 2016-11-20 DIAGNOSIS — E039 Hypothyroidism, unspecified: Secondary | ICD-10-CM | POA: Diagnosis not present

## 2016-11-20 DIAGNOSIS — F17210 Nicotine dependence, cigarettes, uncomplicated: Secondary | ICD-10-CM | POA: Diagnosis not present

## 2016-11-20 DIAGNOSIS — J449 Chronic obstructive pulmonary disease, unspecified: Secondary | ICD-10-CM | POA: Diagnosis not present

## 2016-11-20 DIAGNOSIS — R4189 Other symptoms and signs involving cognitive functions and awareness: Secondary | ICD-10-CM | POA: Diagnosis not present

## 2016-11-20 DIAGNOSIS — I4891 Unspecified atrial fibrillation: Secondary | ICD-10-CM | POA: Insufficient documentation

## 2016-11-20 DIAGNOSIS — Z79899 Other long term (current) drug therapy: Secondary | ICD-10-CM | POA: Insufficient documentation

## 2016-11-20 DIAGNOSIS — R4182 Altered mental status, unspecified: Secondary | ICD-10-CM | POA: Diagnosis not present

## 2016-11-20 DIAGNOSIS — R9431 Abnormal electrocardiogram [ECG] [EKG]: Secondary | ICD-10-CM | POA: Diagnosis not present

## 2016-11-20 HISTORY — DX: Major depressive disorder, single episode, severe with psychotic features: F32.3

## 2016-11-20 HISTORY — DX: Other symptoms and signs involving cognitive functions and awareness: R41.89

## 2016-11-20 HISTORY — DX: Unspecified dementia with behavioral disturbance: F03.91

## 2016-11-20 HISTORY — DX: Abnormal reflex: R29.2

## 2016-11-20 HISTORY — DX: Unspecified dementia, unspecified severity, with psychotic disturbance: F03.92

## 2016-11-20 LAB — I-STAT CHEM 8, ED
BUN: 17 mg/dL (ref 6–20)
CREATININE: 1 mg/dL (ref 0.44–1.00)
Calcium, Ion: 1.13 mmol/L — ABNORMAL LOW (ref 1.15–1.40)
Chloride: 96 mmol/L — ABNORMAL LOW (ref 101–111)
GLUCOSE: 126 mg/dL — AB (ref 65–99)
HEMATOCRIT: 31 % — AB (ref 36.0–46.0)
HEMOGLOBIN: 10.5 g/dL — AB (ref 12.0–15.0)
POTASSIUM: 4 mmol/L (ref 3.5–5.1)
Sodium: 135 mmol/L (ref 135–145)
TCO2: 28 mmol/L (ref 22–32)

## 2016-11-20 LAB — URINALYSIS, ROUTINE W REFLEX MICROSCOPIC
BILIRUBIN URINE: NEGATIVE
Glucose, UA: NEGATIVE mg/dL
HGB URINE DIPSTICK: NEGATIVE
Ketones, ur: NEGATIVE mg/dL
Leukocytes, UA: NEGATIVE
Nitrite: NEGATIVE
Protein, ur: NEGATIVE mg/dL
Specific Gravity, Urine: 1.013 (ref 1.005–1.030)
pH: 6 (ref 5.0–8.0)

## 2016-11-20 LAB — CBC
HCT: 32 % — ABNORMAL LOW (ref 36.0–46.0)
Hemoglobin: 10.4 g/dL — ABNORMAL LOW (ref 12.0–15.0)
MCH: 29.5 pg (ref 26.0–34.0)
MCHC: 32.5 g/dL (ref 30.0–36.0)
MCV: 90.9 fL (ref 78.0–100.0)
PLATELETS: 297 10*3/uL (ref 150–400)
RBC: 3.52 MIL/uL — ABNORMAL LOW (ref 3.87–5.11)
RDW: 13.8 % (ref 11.5–15.5)
WBC: 8.5 10*3/uL (ref 4.0–10.5)

## 2016-11-20 LAB — AMMONIA: Ammonia: 18 umol/L (ref 9–35)

## 2016-11-20 LAB — CBG MONITORING, ED: Glucose-Capillary: 167 mg/dL — ABNORMAL HIGH (ref 65–99)

## 2016-11-20 MED ORDER — ACETAMINOPHEN 500 MG PO TABS
500.0000 mg | ORAL_TABLET | Freq: Once | ORAL | Status: AC
  Administered 2016-11-20: 500 mg via ORAL
  Filled 2016-11-20: qty 1

## 2016-11-20 NOTE — ED Triage Notes (Signed)
husband stated, she was discharge from a mental Health unit in [place], and she is no better,. She is up all night like every 2 hours.  ?she has fallen numerous times and Im not able to get her up off the floor cause of my back.

## 2016-11-20 NOTE — BH Assessment (Signed)
CSW contacted patient's nurse at gather additional information prior to completing psych assessment.  Janett Billow, RN will set up tele-assessment in room. Patient to be assessed in 10 mins.   Rigoberto Noel, MSW, LCSW Triage Specialist [phone],

## 2016-11-20 NOTE — ED Notes (Signed)
ED Provider at bedside. 

## 2016-11-20 NOTE — ED Provider Notes (Signed)
[facility] DEPT Provider Note   CSN: [id number] Arrival date & time: 11/20/16  1352     History   Chief Complaint Chief Complaint  Patient presents with  . Hallucinations  . Altered Mental Status    HPI Destiny Hughes is a 71 y.o. female.  This is a 71 year old female with no major depressive disorder with psychosis who presents with worsening hallucinations and behavioral health concerns by family.  Patient was recently admitted at [facility] in [place] for psychosis. Her medications were titrated and optimized there and she was discharged.  Patient was also recently seen here in the ED a few days prior for unrelated complaint with rectal bleeding.  Patient also has history of dementia and short-term memory issues.  Her husband and son who are at bedside state patient has been acting much more psychotic over the past couple days, she exhibits negative symptoms such as decreased motion and walking around the house with her hand on the wall feeling for objects behind it.  They deny any other symptoms including dyspnea, chest pain, abdominal pain, increased urinary frequency, dysuria.  They deny any rashes, no recent travel, no fevers.   The history is provided by medical records, the spouse and a relative. The history is limited by the condition of the patient.   Past Medical History:  Diagnosis Date  . Chronic back pain   . Collapse of right lung   . COPD (chronic obstructive pulmonary disease) (HCC)    Hx. Bronchitis occ, 01-25-11 somes issue now taking  Z-pak-started 01-24-11/Scar tissue  present  from previous lung collapse  . Depression   . GERD (gastroesophageal reflux disease)    tx. TUMS  . Glaucoma   . Hyperlipemia   . Hypothyroidism    tx. Levothyroxine  . Nephrolithiasis   . Neuromuscular disorder ([name]) 01-25-11   Pain/nerve stimulator implanted -2 yrs ago.    Patient Active Problem List   Diagnosis Date Noted  . Major depression with psychotic features  ([place]) 10/19/2016  . Dementia with psychosis 10/19/2016  . Depression due to physical illness   . Cognitive retention disorder   . Atrial flutter ([place]) 08/15/2016  . Atrial fibrillation with RVR ([place]) 08/15/2016  . SVT (supraventricular tachycardia) ([place]) 07/11/2016  . Weight loss 07/08/2016  . AKI (acute kidney injury) ([place]) 07/02/2016  . Acute kidney injury ([place]) 07/01/2016  . Hyperglycemia 07/01/2016  . Hyponatremia 07/01/2016  . Intractable nausea and vomiting 07/01/2016  . Abnormal EKG 07/01/2016  . Diastolic CHF ([place]) 28/36/6294  . Medication management 04/28/2015  . Emphysema lung ([place]) 12/26/2014  . GERD (gastroesophageal reflux disease) 12/26/2014  . Chronic bronchitis ([place]) 11/13/2014  . COPD (chronic obstructive pulmonary disease) ([place]) 10/03/2014  . At high risk for falls 10/03/2014  . Hypothyroidism 10/03/2014  . Mitral valve prolapse 10/03/2014  . Encounter for Medicare annual wellness exam 10/03/2014  . IBS (irritable bowel syndrome) 04/01/2014  . H/O total knee replacement 04/22/2013  . Essential hypertension 04/15/2013  . Vitamin D deficiency 04/15/2013  . Encounter for long-term (current) use of medications 04/15/2013  . Prediabetes 04/15/2013  . Osteoarthritis 01/31/2011  . Hyperlipemia   . Glaucoma   . Chronic pain disorder   . Chronic back pain     Past Surgical History:  Procedure Laterality Date  . A-FLUTTER ABLATION N/A 08/16/2016   Procedure: A-Flutter Ablation;  Surgeon: Evans Lance, MD;  Location: [facility];  Service: Cardiovascular;  Laterality: N/A;  . APPENDECTOMY  01-25-11  .  CARPAL TUNNEL RELEASE  01-25-11   [place].  . CERVICAL SPINE SURGERY  01-25-11   2005-multiple levels  . CHOLECYSTECTOMY  01-25-11   '07  . COLONOSCOPY WITH PROPOFOL N/A 07/23/2012   Procedure: COLONOSCOPY WITH PROPOFOL;  Surgeon: Garlan Fair, MD;  Location: [facility];  Service: Endoscopy;  Laterality: N/A;  . EYE SURGERY    . JOINT REPLACEMENT  01-25-11     6'03 LTHA-hemi  . KNEE ARTHROPLASTY  01-25-11   '04-right, revised x2  . SPINAL CORD STIMULATOR IMPLANT  2009 APPROX   DR. ELSNER  . THORACIC SPINE SURGERY  01-25-11   6'02- then nerve stimulator implanted after  . TOTAL KNEE REVISION  02/01/2011   Procedure: TOTAL KNEE REVISION;  Surgeon: Mauri Pole;  Location: [facility];  Service: Orthopedics;  Laterality: Right;  . TUBAL LIGATION      OB History    No data available       Home Medications    Prior to Admission medications   Medication Sig Start Date End Date Taking? Authorizing Provider  amLODipine (NORVASC) 5 MG tablet Take 5 mg by mouth daily. 11/13/16  Yes [provider]  apixaban (ELIQUIS) 5 MG TABS tablet Take 1 tablet (5 mg total) by mouth 2 (two) times daily. 08/09/16  Yes Sherran Needs, NP  benztropine (COGENTIN) 0.5 MG tablet Take 0.5 mg by mouth 2 (two) times daily. 11/13/16  Yes [provider]  donepezil (ARICEPT) 5 MG tablet Take 1 tablet (5 mg total) by mouth at bedtime. 10/12/16  Yes Mikhail, Maryann, DO  gabapentin (NEURONTIN) 100 MG capsule Take 1 capsule (100 mg total) by mouth 2 (two) times daily. 10/12/16  Yes Mikhail, Velta Addison, DO  hydrocortisone (ANUSOL-HC) 25 MG suppository Place 1 suppository (25 mg total) rectally 2 (two) times daily. For 7 days 11/15/16  Yes Harris, Abigail, PA-C  ipratropium (ATROVENT) 0.02 % nebulizer solution Take by nebulization 2 (two) times daily. 11/18/16  Yes [provider]  levalbuterol (XOPENEX HFA) 45 MCG/ACT inhaler Inhale 2 puffs into the lungs every 4 (four) hours as needed for wheezing. 09/22/16  Yes Mabe, Forbes Cellar, MD  levothyroxine (SYNTHROID, LEVOTHROID) 125 MCG tablet Take 125 mcg by mouth daily before breakfast.   Yes [provider]  lisinopril (PRINIVIL,ZESTRIL) 10 MG tablet Take 20 mg by mouth daily. 11/13/16  Yes [provider]  Melatonin 3 MG TABS Take 3 mg by mouth at bedtime.   Yes [provider]  memantine  (NAMENDA) 5 MG tablet Take 5 mg by mouth daily. 11/13/16  Yes [provider]  nicotine (NICODERM CQ - DOSED IN MG/24 HOURS) 14 mg/24hr patch Place 14 mg onto the skin daily.   Yes [provider]  pantoprazole (PROTONIX) 40 MG tablet Take 1 tablet (40 mg total) by mouth 2 (two) times daily. 07/14/16  Yes Ghimire, Henreitta Leber, MD  propranolol (INDERAL) 80 MG tablet Take 40 mg by mouth 3 (three) times daily. 11/07/16  Yes [provider]  risperiDONE (RISPERDAL) 1 MG tablet Take 1 mg by mouth 3 (three) times daily. 11/13/16  Yes [provider]  sodium chloride 1 g tablet Take 1 g by mouth 3 (three) times daily. 11/07/16  Yes [provider]  traZODone (DESYREL) 150 MG tablet Take 300 mg by mouth at bedtime as needed. for sleep 11/13/16  Yes [provider]  lisinopril (PRINIVIL,ZESTRIL) 5 MG tablet Take 1 tablet (5 mg total) by mouth daily. Patient not taking: Reported  on 11/20/2016 08/31/16   Skeet Latch, MD  magnesium oxide (MAG-OX) 400 MG tablet Take 1 tablet (400 mg total) by mouth daily. Patient not taking: Reported on 11/20/2016 09/07/16   Erlene Quan, PA-C    Family History Family History  Problem Relation Age of Onset  . Heart attack Father   . Emphysema Father   . Aneurysm Mother        CEREBRAL  . Emphysema Mother   . Dementia Mother   . Diabetes Son   . Hypertension Son   . Hypertension Son   . Cancer Sister        Widely Metastatic  . Asthma Son        x2  . Breast cancer Paternal Aunt   . Rheum arthritis Maternal Aunt     Social History Social History  Substance Use Topics  . Smoking status: Current Every Day Smoker    Packs/day: 0.75    Years: 50.00    Types: Cigarettes    Start date: 09/13/1961  . Smokeless tobacco: Never Used     Comment: Decreased Down to 2/3 a Day if Thinking [place] It  . Alcohol use No     Comment: none in 10 years     Allergies   Albuterol; Penicillins; Ventolin [kdc:albuterol]; Sulfa  antibiotics; Zanaflex [tizanidine]; Codeine; and Ecotrin [aspirin]   Review of Systems Review of Systems  Unable to perform ROS: Dementia     Physical Exam Updated Vital Signs BP 140/73 (BP Location: Right Arm)   Pulse 76   Temp 98.7 F (37.1 C) (Oral)   Resp 16   Ht 5\' 6"  (1.676 m)   Wt 59 kg (130 lb)   SpO2 95%   BMI 20.98 kg/m   Physical Exam  Constitutional: She appears well-developed and well-nourished. No distress.  HENT:  Head: Normocephalic and atraumatic.  Eyes: Conjunctivae are normal.  Neck: Neck supple.  Cardiovascular: Normal rate and regular rhythm.   No murmur heard. Pulmonary/Chest: Effort normal and breath sounds normal. No respiratory distress.  Abdominal: Soft. There is no tenderness.  Musculoskeletal: She exhibits no edema.  Neurological: She is alert.  Uncooperative with neuro exam. No obvious deficits.  Skin: Skin is warm and dry.  Psychiatric: Thought content normal. Her mood appears not anxious. Her affect is blunt. Her affect is not angry. Her speech is tangential. Her speech is not rapid and/or pressured, not delayed and not slurred. She is withdrawn and actively hallucinating. Cognition and memory are impaired. She does not exhibit a depressed mood.  Disheveled appearance, hair uncombed.  Patient does not appear distressed however inattentive to my exam and answers questions appropriately.  Impaired memory. She is inattentive.  Nursing note and vitals reviewed.  ED Treatments / Results  Labs (all labs ordered are listed, but only abnormal results are displayed) Labs Reviewed  URINALYSIS, ROUTINE W REFLEX MICROSCOPIC - Abnormal; Notable for the following:       Result Value   Color, Urine STRAW (*)    All other components within normal limits  CBC - Abnormal; Notable for the following:    RBC 3.52 (*)    Hemoglobin 10.4 (*)    HCT 32.0 (*)    All other components within normal limits  I-STAT CHEM 8, ED - Abnormal; Notable for the  following:    Chloride 96 (*)    Glucose, Bld 126 (*)    Calcium, Ion 1.13 (*)    Hemoglobin 10.5 (*)  HCT 31.0 (*)    All other components within normal limits  CBG MONITORING, ED - Abnormal; Notable for the following:    Glucose-Capillary 167 (*)    All other components within normal limits  AMMONIA    EKG  EKG Interpretation None       Radiology Dg Chest Portable 1 View  Result Date: 11/20/2016 CLINICAL DATA:  Altered mental status EXAM: PORTABLE CHEST 1 VIEW COMPARISON:  10/19/2016 chest radiograph. FINDINGS: Partially visualized surgical hardware from ACDF overlying the lower cervical spine. Spinal stimulator lead terminates over the upper thoracic spine. Stable cardiomediastinal silhouette with normal heart size. No pneumothorax. No pleural effusion. Stable right costophrenic angle scarring. No pulmonary edema. No acute consolidative airspace disease. IMPRESSION: Stable right costophrenic angle scarring. No active pulmonary disease. Electronically Signed   By: Ilona Sorrel M.D.   On: 11/20/2016 18:07    Procedures Procedures (including critical care time)  Medications Ordered in ED Medications  lisinopril (PRINIVIL,ZESTRIL) tablet 5 mg (not administered)  magnesium oxide (MAG-OX) tablet 400 mg (not administered)  amLODipine (NORVASC) tablet 5 mg (not administered)  apixaban (ELIQUIS) tablet 5 mg (not administered)  benztropine (COGENTIN) tablet 0.5 mg (0.5 mg Oral Given 11/21/16 0054)  donepezil (ARICEPT) tablet 5 mg (5 mg Oral Given 11/21/16 0044)  gabapentin (NEURONTIN) capsule 100 mg (100 mg Oral Given 11/21/16 0046)  lisinopril (PRINIVIL,ZESTRIL) tablet 20 mg (not administered)  levothyroxine (SYNTHROID, LEVOTHROID) tablet 125 mcg (not administered)  Melatonin TABS 3 mg (3 mg Oral Given 11/21/16 0044)  memantine (NAMENDA) tablet 5 mg (not administered)  nicotine (NICODERM CQ - dosed in mg/24 hours) patch 14 mg (not administered)  pantoprazole (PROTONIX) EC tablet 40 mg  (40 mg Oral Given 11/21/16 0044)  propranolol (INDERAL) tablet 40 mg (not administered)  risperiDONE (RISPERDAL) tablet 1 mg (not administered)  sodium chloride tablet 1 g (not administered)  traZODone (DESYREL) tablet 300 mg (300 mg Oral Given 11/21/16 0048)  levalbuterol (XOPENEX) nebulizer solution 0.63 mg (not administered)  ipratropium (ATROVENT) nebulizer solution 0.5 mg (0.5 mg Nebulization Given 11/21/16 0056)  acetaminophen (TYLENOL) tablet 1,000 mg (1,000 mg Oral Given 11/21/16 0046)  acetaminophen (TYLENOL) tablet 500 mg (500 mg Oral Given 11/20/16 1916)     Initial Impression / Assessment and Plan / ED Course  I have reviewed the triage vital signs and the nursing notes.  Pertinent labs & imaging results that were available during my care of the patient were reviewed by me and considered in my medical decision making (see chart for details).     This is a 71 year old female with no major depressive disorder with psychosis who presents with worsening hallucinations and behavioral health concerns by family.  Patient was recently admitted at [facility] in [place] for psychosis.  Given behavior consistent with prior psychiatric admissions and with family at bedside confirming psychiatric behavior and concern for medication optimization, psychiatry consulted for behavioral health evaluation.  Basic laboratory studies ordered which showed no leukocytosis, ammonia level unremarkable, electrolytes largely unchanged.  CBG 167.  Urine studies unremarkable for infectious etiology.  No falls, fevers, or concerning picture for meningitis or encephalitis. No history of ingestion or overdose. Will not obtain CT head at this time.  Patient medically cleared. Labs and imaging ordered and read by myself. Radiographs interpreted by radiology.  Final Clinical Impressions(s) / ED Diagnoses   Final diagnoses:  Hallucinations    New Prescriptions New Prescriptions   No medications on file       Aldona Lento, MD  11/21/16 [place], [place], DO 11/21/16 1309

## 2016-11-20 NOTE — BH Assessment (Signed)
Clinician completed tele-assessment with patient, patient's husband and son present.   Clinician consulted with Hughie Closs, NP who recommends patient be evaluated in the AM by psychiatry.   Clinician provided updates to patient's nurse Janett Billow.  Rigoberto Noel, MSW, LCSW Triage Specialist [phone],

## 2016-11-20 NOTE — ED Notes (Addendum)
Pt repeatedly getting up from the bed on her own, despite encouragement to stay in the bed and requests that she tell someone if she needs to get up. Pt has shuffling gait, but has been able to ambulate okay to bathroom. Has gotten up, stating she needs to urinate, three times now in the past 50min. Observed to be pacing in room, in circles.   Placing NT in room directly to serve as sitter. Contacted Staffing regarding sitter status after 2300. Spoke with Tyrone Nine, MD about medication orders.

## 2016-11-20 NOTE — BH Assessment (Signed)
Tele Assessment Note   Patient Name: Destiny Hughes MRN: [id number] Referring Physician: Tyrone Nine, MD Location of Patient: [facility] Location of Provider: Other: [facility]  Raneem Mendolia Ehrich is an 71 y.o. female who presents to [facility] with husband and son due to issues with bowels and coughing up phlegm. Patient denies SI, HI, SA. Patient endorses VH, "seeing a little girl on couch this morning."  Patient and family unaware of how often the [place] occurs. Patient pleasant to clinician during assessment but irritable with family members. Patient Ox3, not oriented to time.  Collateral from husband [name] and son Aaron Edelman: Patient not sleeping at all. Patient lives in the home with husband who also has health issues. Patient often falls when walking, irritable, fidgety. Husband stated she falls several times and he cannot pick her up. Had to call son at 4am to come pick up mother off the floor. Patient not sleeping, one hour at a time, up to 3 hours a day at most.   Patient was discharged from [facility] a week ago. Patient sees Darcey Nora, [place] [phone]). Patient referred to [facility] for psychiatry- initial appointment on 11/30- family feels like appointment is too far out and needs help being able to sleep.  Patient about to begin to start with [facility] for at home health services [phone]).    Diagnosis: Dementia with Psychosis  Past Medical History:  Past Medical History:  Diagnosis Date  . Chronic back pain   . Collapse of right lung   . COPD (chronic obstructive pulmonary disease) (HCC)    Hx. Bronchitis occ, 01-25-11 somes issue now taking  Z-pak-started 01-24-11/Scar tissue  present  from previous lung collapse  . Depression   . GERD (gastroesophageal reflux disease)    tx. TUMS  . Glaucoma   . Hyperlipemia   . Hypothyroidism    tx. Levothyroxine  . Nephrolithiasis   . Neuromuscular disorder ([place]) 01-25-11   Pain/nerve stimulator implanted -2 yrs ago.     Past Surgical History:  Procedure Laterality Date  . A-FLUTTER ABLATION N/A 08/16/2016   Procedure: A-Flutter Ablation;  Surgeon: Evans Lance, MD;  Location: [facility];  Service: Cardiovascular;  Laterality: N/A;  . APPENDECTOMY  01-25-11  . CARPAL TUNNEL RELEASE  01-25-11   Bil.  . CERVICAL SPINE SURGERY  01-25-11   2005-multiple levels  . CHOLECYSTECTOMY  01-25-11   '07  . COLONOSCOPY WITH PROPOFOL N/A 07/23/2012   Procedure: COLONOSCOPY WITH PROPOFOL;  Surgeon: Garlan Fair, MD;  Location: [facility];  Service: Endoscopy;  Laterality: N/A;  . EYE SURGERY    . JOINT REPLACEMENT  01-25-11   6'03 LTHA-hemi  . KNEE ARTHROPLASTY  01-25-11   '04-right, revised x2  . SPINAL CORD STIMULATOR IMPLANT  2009 APPROX   DR. ELSNER  . THORACIC SPINE SURGERY  01-25-11   6'02- then nerve stimulator implanted after  . TOTAL KNEE REVISION  02/01/2011   Procedure: TOTAL KNEE REVISION;  Surgeon: Mauri Pole;  Location: [facility];  Service: Orthopedics;  Laterality: Right;  . TUBAL LIGATION      Family History:  Family History  Problem Relation Age of Onset  . Heart attack Father   . Emphysema Father   . Aneurysm Mother        CEREBRAL  . Emphysema Mother   . Dementia Mother   . Diabetes Son   . Hypertension Son   . Hypertension Son   . Cancer Sister  Widely Metastatic  . Asthma Son        x2  . Breast cancer Paternal Aunt   . Rheum arthritis Maternal Aunt     Social History:  reports that she has been smoking Cigarettes.  She started smoking about 55 years ago. She has a 37.50 pack-year smoking history. She has never used smokeless tobacco. She reports that she does not drink alcohol or use drugs.  Additional Social History:  Alcohol / Drug Use Pain Medications: See MAR  Prescriptions: See MAR Over the Counter: See MAR History of alcohol / drug use?: No history of alcohol / drug abuse  CIWA: CIWA-Ar Pulse Rate: 68 COWS:    PATIENT STRENGTHS: (choose at  least two) Active sense of humor Communication skills Supportive family/friends  Allergies:  Allergies  Allergen Reactions  . Albuterol Other (See Comments)    Had difficulty breathing after a nebulizer treatment  . Penicillins Anaphylaxis and Swelling    Has patient had a PCN reaction causing immediate rash, facial/tongue/throat swelling, SOB or lightheadedness with hypotension: Yes Has patient had a PCN reaction causing severe rash involving mucus membranes or skin necrosis: Rash Has patient had a PCN reaction that required hospitalization: No Has patient had a PCN reaction occurring within the last 10 years: No If all of the above answers are "NO", then may proceed with Cephalosporin use.  [name] [Kdc:Albuterol] Shortness Of Breath  . Sulfa Antibiotics Swelling    Throat swells, but no shortness of breath noted  . Zanaflex [Tizanidine] Other (See Comments)    Possible confusion (??)  . Codeine Nausea And Vomiting  . Ecotrin [Aspirin] Nausea And Vomiting    Home Medications:  (Not in a hospital admission)  OB/GYN Status:  No LMP recorded. Patient is postmenopausal.  General Assessment Data Location of Assessment: [facility] ED TTS Assessment: In system Is this a Tele or Face-to-Face Assessment?: Tele Assessment Is this an Initial Assessment or a Re-assessment for this encounter?: Initial Assessment Marital status: Married Is patient pregnant?: No Pregnancy Status: No Living Arrangements: Spouse/significant other Can pt return to current living arrangement?: Yes Admission Status: Voluntary Is patient capable of signing voluntary admission?: Yes Referral Source: Self/Family/Friend Insurance type: Medicare     Crisis Care Plan Living Arrangements: Spouse/significant other Name of Psychiatrist: Patient referred to [facility] Name of Therapist: none     Risk to self with the past 6 months Suicidal Ideation: No Has patient been a risk to self within the past 6  months prior to admission? : No Suicidal Intent: No Has patient had any suicidal intent within the past 6 months prior to admission? : No Is patient at risk for suicide?: Yes Suicidal Plan?: No Has patient had any suicidal plan within the past 6 months prior to admission? : No Access to Means: No What has been your use of drugs/alcohol within the last 12 months?: none reported Previous Attempts/Gestures: No How many times?: 0 Other Self Harm Risks: none reported Triggers for Past Attempts: None known Intentional Self Injurious Behavior: None Family Suicide History: No Recent stressful life event(s): Other (Comment) (health issues) Persecutory voices/beliefs?: No Depression: Yes Depression Symptoms: Insomnia, Feeling angry/irritable Substance abuse history and/or treatment for substance abuse?: No Suicide prevention information given to non-admitted patients: Not applicable  Risk to Others within the past 6 months Homicidal Ideation: No Does patient have any lifetime risk of violence toward others beyond the six months prior to admission? : No Thoughts of Harm to Others: No Current  Homicidal Intent: No Current Homicidal Plan: No Access to Homicidal Means: No History of harm to others?: No Assessment of Violence: None Noted Violent Behavior Description: none reported Does patient have access to weapons?: No Criminal Charges Pending?: No Does patient have a court date: No Is patient on probation?: No  Psychosis Hallucinations: Visual Delusions: None noted  Mental Status Report Appearance/Hygiene: Disheveled Eye Contact: Fair Motor Activity: Freedom of movement Speech: Logical/coherent Level of Consciousness: Alert Mood: Irritable, Pleasant Affect: Irritable Anxiety Level: Minimal Thought Processes: Coherent, Relevant Judgement: Unimpaired Orientation: Person, Place, Situation Obsessive Compulsive Thoughts/Behaviors: None  Cognitive Functioning Concentration:  Normal Memory: Remote Impaired, Recent Impaired IQ: Average Insight: Fair Impulse Control: Fair Appetite: Fair Sleep: Decreased Total Hours of Sleep: 3 Vegetative Symptoms: Staying in bed  ADLScreening [facility] Assessment Services) Patient's cognitive ability adequate to safely complete daily activities?: No Patient able to express need for assistance with ADLs?: No Independently performs ADLs?: No  Prior Inpatient Therapy Prior Inpatient Therapy: Yes Prior Therapy Dates: 10/2016 Prior Therapy Facilty/Provider(s): [place]  Reason for Treatment: psychosis/ Dementia  Prior Outpatient Therapy Prior Outpatient Therapy: No Prior Therapy Dates: NA Prior Therapy Facilty/Provider(s): NA Reason for Treatment: NA Does patient have an ACCT team?: No Does patient have Intensive In-House Services?  : No Does patient have [facility] services? : No Does patient have P4CC services?: No  ADL Screening (condition at time of admission) Patient's cognitive ability adequate to safely complete daily activities?: No Is the patient deaf or have difficulty hearing?: No Does the patient have difficulty seeing, even when wearing glasses/contacts?: No Does the patient have difficulty concentrating, remembering, or making decisions?: Yes Patient able to express need for assistance with ADLs?: No Does the patient have difficulty dressing or bathing?: Yes Independently performs ADLs?: No       Abuse/Neglect Assessment (Assessment to be complete while patient is alone) Physical Abuse: Denies Verbal Abuse: Denies Sexual Abuse: Denies Exploitation of patient/patient's resources: Denies Self-Neglect: Denies     Regulatory affairs officer (For Healthcare) Does Patient Have a Medical Advance Directive?: No Copy of Healthcare Power of Attorney in Chart?: No - copy requested Copy of Living Will in Chart?: No - copy requested    Additional Information 1:1 In Past 12 Months?: No CIRT Risk: No Elopement Risk:  No Does patient have medical clearance?: Yes     Disposition:  Disposition Initial Assessment Completed for this Encounter: Yes Disposition of Patient: Other dispositions Type of outpatient treatment:  (To be evaluated by AM psych) Other disposition(s): Other (Comment)   Tiaria Biby R Breia Ocampo 11/20/2016 7:00 PM

## 2016-11-20 NOTE — ED Notes (Signed)
Pt belongings sent with family. Pt will be moved over to Pod [facility] once med reconciliation is completed with pt and family.

## 2016-11-20 NOTE — ED Notes (Signed)
TTS at bedside. Pt being assessed at this time.

## 2016-11-20 NOTE — ED Notes (Addendum)
Pt taken to restroom for urine sample. Pt urinated but sample missed the collection hat. Will reattempt. PO fluids given.

## 2016-11-20 NOTE — ED Notes (Signed)
Potable xray at bedside.

## 2016-11-21 ENCOUNTER — Encounter (HOSPITAL_COMMUNITY): Payer: Self-pay | Admitting: *Deleted

## 2016-11-21 DIAGNOSIS — R451 Restlessness and agitation: Secondary | ICD-10-CM

## 2016-11-21 DIAGNOSIS — F419 Anxiety disorder, unspecified: Secondary | ICD-10-CM

## 2016-11-21 DIAGNOSIS — R443 Hallucinations, unspecified: Secondary | ICD-10-CM | POA: Diagnosis not present

## 2016-11-21 DIAGNOSIS — F323 Major depressive disorder, single episode, severe with psychotic features: Secondary | ICD-10-CM | POA: Diagnosis not present

## 2016-11-21 DIAGNOSIS — R4189 Other symptoms and signs involving cognitive functions and awareness: Secondary | ICD-10-CM | POA: Diagnosis not present

## 2016-11-21 DIAGNOSIS — R450 Nervousness: Secondary | ICD-10-CM

## 2016-11-21 DIAGNOSIS — R413 Other amnesia: Secondary | ICD-10-CM

## 2016-11-21 DIAGNOSIS — F0631 Mood disorder due to known physiological condition with depressive features: Secondary | ICD-10-CM | POA: Diagnosis not present

## 2016-11-21 DIAGNOSIS — G4700 Insomnia, unspecified: Secondary | ICD-10-CM

## 2016-11-21 DIAGNOSIS — R4587 Impulsiveness: Secondary | ICD-10-CM

## 2016-11-21 DIAGNOSIS — Z810 Family history of intellectual disabilities: Secondary | ICD-10-CM | POA: Diagnosis not present

## 2016-11-21 DIAGNOSIS — R45850 Homicidal ideations: Secondary | ICD-10-CM

## 2016-11-21 DIAGNOSIS — F0391 Unspecified dementia with behavioral disturbance: Secondary | ICD-10-CM | POA: Diagnosis not present

## 2016-11-21 DIAGNOSIS — F17210 Nicotine dependence, cigarettes, uncomplicated: Secondary | ICD-10-CM | POA: Diagnosis not present

## 2016-11-21 MED ORDER — LISINOPRIL 20 MG PO TABS
20.0000 mg | ORAL_TABLET | Freq: Every day | ORAL | Status: DC
  Administered 2016-11-21 – 2016-11-26 (×6): 20 mg via ORAL
  Filled 2016-11-21 (×5): qty 1

## 2016-11-21 MED ORDER — SODIUM CHLORIDE 1 G PO TABS
1.0000 g | ORAL_TABLET | Freq: Three times a day (TID) | ORAL | Status: DC
  Administered 2016-11-21 – 2016-11-26 (×14): 1 g via ORAL
  Filled 2016-11-21 (×23): qty 1

## 2016-11-21 MED ORDER — MELATONIN 3 MG PO TABS
3.0000 mg | ORAL_TABLET | Freq: Every day | ORAL | Status: DC
  Administered 2016-11-21 – 2016-11-24 (×4): 3 mg via ORAL
  Filled 2016-11-21 (×8): qty 1

## 2016-11-21 MED ORDER — APIXABAN 5 MG PO TABS
5.0000 mg | ORAL_TABLET | Freq: Two times a day (BID) | ORAL | Status: DC
  Administered 2016-11-21 – 2016-11-26 (×10): 5 mg via ORAL
  Filled 2016-11-21 (×14): qty 1

## 2016-11-21 MED ORDER — AMLODIPINE BESYLATE 5 MG PO TABS
5.0000 mg | ORAL_TABLET | Freq: Every day | ORAL | Status: DC
  Administered 2016-11-21 – 2016-11-26 (×5): 5 mg via ORAL
  Filled 2016-11-21 (×6): qty 1

## 2016-11-21 MED ORDER — IPRATROPIUM BROMIDE 0.02 % IN SOLN
0.5000 mg | Freq: Two times a day (BID) | RESPIRATORY_TRACT | Status: DC
  Administered 2016-11-21 – 2016-11-26 (×11): 0.5 mg via RESPIRATORY_TRACT
  Filled 2016-11-21 (×11): qty 2.5

## 2016-11-21 MED ORDER — PROPRANOLOL HCL 40 MG PO TABS
40.0000 mg | ORAL_TABLET | Freq: Three times a day (TID) | ORAL | Status: DC
  Administered 2016-11-21 – 2016-11-26 (×14): 40 mg via ORAL
  Filled 2016-11-21 (×22): qty 1

## 2016-11-21 MED ORDER — PANTOPRAZOLE SODIUM 40 MG PO TBEC
40.0000 mg | DELAYED_RELEASE_TABLET | Freq: Two times a day (BID) | ORAL | Status: DC
  Administered 2016-11-21 – 2016-11-26 (×12): 40 mg via ORAL
  Filled 2016-11-21 (×12): qty 1

## 2016-11-21 MED ORDER — MEMANTINE HCL 10 MG PO TABS
5.0000 mg | ORAL_TABLET | Freq: Every day | ORAL | Status: DC
  Administered 2016-11-21 – 2016-11-26 (×6): 5 mg via ORAL
  Filled 2016-11-21 (×7): qty 1

## 2016-11-21 MED ORDER — DONEPEZIL HCL 5 MG PO TABS
5.0000 mg | ORAL_TABLET | Freq: Every day | ORAL | Status: DC
  Administered 2016-11-21 – 2016-11-25 (×6): 5 mg via ORAL
  Filled 2016-11-21 (×6): qty 1

## 2016-11-21 MED ORDER — LEVOTHYROXINE SODIUM 125 MCG PO TABS
125.0000 ug | ORAL_TABLET | Freq: Every day | ORAL | Status: DC
  Administered 2016-11-21 – 2016-11-26 (×6): 125 ug via ORAL
  Filled 2016-11-21 (×7): qty 1

## 2016-11-21 MED ORDER — GABAPENTIN 100 MG PO CAPS
100.0000 mg | ORAL_CAPSULE | Freq: Two times a day (BID) | ORAL | Status: DC
  Administered 2016-11-21 – 2016-11-26 (×11): 100 mg via ORAL
  Filled 2016-11-21 (×12): qty 1

## 2016-11-21 MED ORDER — LEVALBUTEROL HCL 0.63 MG/3ML IN NEBU
0.6300 mg | INHALATION_SOLUTION | RESPIRATORY_TRACT | Status: DC | PRN
Start: 2016-11-21 — End: 2016-11-26

## 2016-11-21 MED ORDER — RISPERIDONE 1 MG PO TABS
1.0000 mg | ORAL_TABLET | Freq: Three times a day (TID) | ORAL | Status: DC
  Administered 2016-11-21 – 2016-11-26 (×16): 1 mg via ORAL
  Filled 2016-11-21 (×18): qty 1

## 2016-11-21 MED ORDER — ACETAMINOPHEN 500 MG PO TABS
1000.0000 mg | ORAL_TABLET | Freq: Four times a day (QID) | ORAL | Status: DC | PRN
  Administered 2016-11-21 – 2016-11-25 (×8): 1000 mg via ORAL
  Filled 2016-11-21 (×8): qty 2

## 2016-11-21 MED ORDER — LISINOPRIL 2.5 MG PO TABS
5.0000 mg | ORAL_TABLET | Freq: Every day | ORAL | Status: DC
  Administered 2016-11-21 – 2016-11-23 (×3): 5 mg via ORAL
  Filled 2016-11-21 (×3): qty 2

## 2016-11-21 MED ORDER — MAGNESIUM OXIDE 400 (241.3 MG) MG PO TABS
400.0000 mg | ORAL_TABLET | Freq: Every day | ORAL | Status: DC
  Administered 2016-11-21 – 2016-11-26 (×6): 400 mg via ORAL
  Filled 2016-11-21 (×9): qty 1

## 2016-11-21 MED ORDER — TRAZODONE HCL 100 MG PO TABS
300.0000 mg | ORAL_TABLET | Freq: Every evening | ORAL | Status: DC | PRN
  Administered 2016-11-21 – 2016-11-25 (×4): 300 mg via ORAL
  Filled 2016-11-21 (×5): qty 3

## 2016-11-21 MED ORDER — NICOTINE 14 MG/24HR TD PT24
14.0000 mg | MEDICATED_PATCH | Freq: Every day | TRANSDERMAL | Status: DC
  Administered 2016-11-21 – 2016-11-26 (×4): 14 mg via TRANSDERMAL
  Filled 2016-11-21 (×5): qty 1

## 2016-11-21 MED ORDER — BENZTROPINE MESYLATE 1 MG PO TABS
0.5000 mg | ORAL_TABLET | Freq: Two times a day (BID) | ORAL | Status: DC
  Administered 2016-11-21 – 2016-11-26 (×11): 0.5 mg via ORAL
  Filled 2016-11-21 (×12): qty 1

## 2016-11-21 NOTE — ED Notes (Signed)
Regular breakfast tray ordered.  

## 2016-11-21 NOTE — Consult Note (Signed)
[facility] Consult   Reason for Consult:  Psychosis, depression, disorganized behavior Referring Physician:  EDP Location of Patient: [facility] Location of Provider: TTS Assessment Department   Patient Identification: SHAMEL GALYEAN MRN:  [id number] Principal Diagnosis: Major depression with psychotic features [facility]) Diagnosis:   Patient Active Problem List   Diagnosis Date Noted  . Major depression with psychotic features ([place]) [F32.3] 10/19/2016  . Dementia with psychosis [F03.91] 10/19/2016  . Depression due to physical illness [F06.31]   . Cognitive retention disorder [R41.89]   . Atrial flutter ([place]) [I48.92] 08/15/2016  . Atrial fibrillation with RVR ([place]) [I48.91] 08/15/2016  . SVT (supraventricular tachycardia) ([place]) [I47.1] 07/11/2016  . Weight loss [R63.4] 07/08/2016  . AKI (acute kidney injury) ([place]) [N17.9] 07/02/2016  . Acute kidney injury ([place]) [N17.9] 07/01/2016  . Hyperglycemia [R73.9] 07/01/2016  . Hyponatremia [E87.1] 07/01/2016  . Intractable nausea and vomiting [R11.2] 07/01/2016  . Abnormal EKG [R94.31] 07/01/2016  . Diastolic CHF ([place]) [H37.16] 10/13/2015  . Medication management [Z79.899] 04/28/2015  . Emphysema lung ([place]) [J43.9] 12/26/2014  . GERD (gastroesophageal reflux disease) [K21.9] 12/26/2014  . Chronic bronchitis ([place]) [J42] 11/13/2014  . COPD (chronic obstructive pulmonary disease) ([place]) [J44.9] 10/03/2014  . At high risk for falls [Z91.81] 10/03/2014  . Hypothyroidism [E03.9] 10/03/2014  . Mitral valve prolapse [I34.1] 10/03/2014  . Encounter for Medicare annual wellness exam [R67.89] 10/03/2014  . IBS (irritable bowel syndrome) [K58.9] 04/01/2014  . H/O total knee replacement [Z96.659] 04/22/2013  . Essential hypertension [I10] 04/15/2013  . Vitamin D deficiency [E55.9] 04/15/2013  . Encounter for long-term (current) use of medications [Z79.899] 04/15/2013  . Prediabetes [R73.03] 04/15/2013  . Osteoarthritis [M19.90] 01/31/2011  .  Hyperlipemia [E78.5]   . Glaucoma [H40.9]   . Chronic pain disorder [G89.4]   . Chronic back pain [M54.9, G89.29]     Total Time spent with patient: 30 minutes  Subjective:   GENENE KILMAN is a 71 y.o. female patient admitted with disorganized behavior and psychosis.  HPI:  Patient with history of Depression, anxiety and Dementia. Patient was IVC'd by her family due to worsening depression, psychosis, disorganized behavior due to not sleeping for days. Patient states that she has been seeing "a little boy.",  And sometimes "hears things." but is unable to clearly elaborate. Patient completes the assessment with her eyes closed and is noted to be irritable. At times provided nonsensical responses to questions posed by this Probation officer. At first was able to state her name but later identified both her location and name as "[place]." When asked the year patient stated "It's 19 something." Patient reports feeling depressed but is not able to identify symptoms. She did reports seeing a "little boy every five minutes." Denies any suicidal ideation. However reports "I was thinking of hurt my husband. No he did not do anything to me. I don't feel that way now but I did yesterday. I don't know what changed." The patient has evident cognitive impairment as evidence by being only able to recall one item of three after two minutes. Patient has very poor insight into her dementia stating "I'm ready to go home. I'm fine now." Areal laughs when asked why she no longer would like to harm her husband. According to notes husband and son express concern that patient is not sleeping at home and falling often requiring her son to come add random hours to assist husband with her care.   Past Psychiatric History: as above  Risk to Self: Suicidal  Ideation: No Suicidal Intent: No Is patient at risk for suicide?: Yes Suicidal Plan?: No Access to Means: No What has been your use of drugs/alcohol within the last 12  months?: none reported How many times?: 0 Other Self Harm Risks: none reported Triggers for Past Attempts: None known Intentional Self Injurious Behavior: None Risk to Others: Homicidal Ideation: Makes vague remarks about hurting husband but no reported history of violence  Thoughts of Harm to Others: No Current Homicidal Intent: No Current Homicidal Plan: No Access to Homicidal Means: No History of harm to others?: No Assessment of Violence: None Noted Violent Behavior Description: none reported Does patient have access to weapons?: No Criminal Charges Pending?: No Does patient have a court date: No Prior Inpatient Therapy: Prior Inpatient Therapy: Yes Prior Therapy Dates: 10/2016 Prior Therapy Facilty/Provider(s): [facility]  Reason for Treatment: psychosis/ Dementia Prior Outpatient Therapy: Prior Outpatient Therapy: No Prior Therapy Dates: NA Prior Therapy Facilty/Provider(s): NA Reason for Treatment: NA Does patient have an ACCT team?: No Does patient have Intensive In-House Services?  : No Does patient have [facility] services? : No Does patient have P4CC services?: No  Past Medical History:  Past Medical History:  Diagnosis Date  . Chronic back pain   . Collapse of right lung   . COPD (chronic obstructive pulmonary disease) (HCC)    Hx. Bronchitis occ, 01-25-11 somes issue now taking  Z-pak-started 01-24-11/Scar tissue  present  from previous lung collapse  . Depression   . GERD (gastroesophageal reflux disease)    tx. TUMS  . Glaucoma   . Hyperlipemia   . Hypothyroidism    tx. Levothyroxine  . Nephrolithiasis   . Neuromuscular disorder ([place]) 01-25-11   Pain/nerve stimulator implanted -2 yrs ago.    Past Surgical History:  Procedure Laterality Date  . A-FLUTTER ABLATION N/A 08/16/2016   Procedure: A-Flutter Ablation;  Surgeon: Evans Lance, MD;  Location: [facility];  Service: Cardiovascular;  Laterality: N/A;  . APPENDECTOMY  01-25-11  . CARPAL TUNNEL  RELEASE  01-25-11   Bil.  . CERVICAL SPINE SURGERY  01-25-11   2005-multiple levels  . CHOLECYSTECTOMY  01-25-11   '07  . COLONOSCOPY WITH PROPOFOL N/A 07/23/2012   Procedure: COLONOSCOPY WITH PROPOFOL;  Surgeon: Garlan Fair, MD;  Location: [facility];  Service: Endoscopy;  Laterality: N/A;  . EYE SURGERY    . JOINT REPLACEMENT  01-25-11   6'03 LTHA-hemi  . KNEE ARTHROPLASTY  01-25-11   '04-right, revised x2  . SPINAL CORD STIMULATOR IMPLANT  2009 APPROX   DR. ELSNER  . THORACIC SPINE SURGERY  01-25-11   6'02- then nerve stimulator implanted after  . TOTAL KNEE REVISION  02/01/2011   Procedure: TOTAL KNEE REVISION;  Surgeon: Mauri Pole;  Location: [facility];  Service: Orthopedics;  Laterality: Right;  . TUBAL LIGATION     Family History:  Family History  Problem Relation Age of Onset  . Heart attack Father   . Emphysema Father   . Aneurysm Mother        CEREBRAL  . Emphysema Mother   . Dementia Mother   . Diabetes Son   . Hypertension Son   . Hypertension Son   . Cancer Sister        Widely Metastatic  . Asthma Son        x2  . Breast cancer Paternal Aunt   . Rheum arthritis Maternal Aunt    Family Psychiatric  History: Unknown Social History:  History  Alcohol Use No    Comment: none in 10 years     History  Drug Use No    Social History   Social History  . Marital status: Married    Spouse name: N/A  . Number of children: 2  . Years of education: N/A   Occupational History  . disabled    Social History Main Topics  . Smoking status: Current Every Day Smoker    Packs/day: 0.75    Years: 50.00    Types: Cigarettes    Start date: 09/13/1961  . Smokeless tobacco: Never Used     Comment: Decreased Down to 2/3 a Day if Thinking Abouit It  . Alcohol use No     Comment: none in 10 years  . Drug use: No  . Sexual activity: No   Other Topics Concern  . None   Social History Narrative   She is from [facility]. She has always lived in [place]. She has traveled to  [place], [place], [place], [place], & [place]. 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 Prior exposure to parakeets with last exposure remote in her current home. Previously had mold in her home that was in her bathroom & fixed. Prior exposure to hot tubs but none recently. Pt lives at home with [name], husband.  Has 2 childrean, 12th grade education.     Additional Social History:    Allergies:   Allergies  Allergen Reactions  . Albuterol Other (See Comments)    Had difficulty breathing after a nebulizer treatment  . Penicillins Anaphylaxis and Swelling    Has patient had a PCN reaction causing immediate rash, facial/tongue/throat swelling, SOB or lightheadedness with hypotension: Yes Has patient had a PCN reaction causing severe rash involving mucus membranes or skin necrosis: Rash Has patient had a PCN reaction that required hospitalization: No Has patient had a PCN reaction occurring within the last 10 years: No If all of the above answers are "NO", then may proceed with Cephalosporin use.  [name] [Kdc:Albuterol] Shortness Of Breath  . Sulfa Antibiotics Swelling    Throat swells, but no shortness of breath noted  . Zanaflex [Tizanidine] Other (See Comments)    Possible confusion (??)  . Codeine Nausea And Vomiting  . Ecotrin [Aspirin] Nausea And Vomiting    Labs:  Results for orders placed or performed during the hospital encounter of 11/20/16 (from the past 48 hour(s))  CBC     Status: Abnormal   Collection Time: 11/20/16  5:50 PM  Result Value Ref Range   WBC 8.5 4.0 - 10.5 K/uL   RBC 3.52 (L) 3.87 - 5.11 MIL/uL   Hemoglobin 10.4 (L) 12.0 - 15.0 g/dL   HCT 32.0 (L) 36.0 - 46.0 %   MCV 90.9 78.0 - 100.0 fL   MCH 29.5 26.0 - 34.0 pg   MCHC 32.5 30.0 - 36.0 g/dL   RDW 13.8 11.5 - 15.5 %   Platelets 297 150 - 400 K/uL  Ammonia     Status: None   Collection Time: 11/20/16   5:50 PM  Result Value Ref Range   Ammonia 18 9 - 35 umol/L  I-Stat Chem 8, ED     Status: Abnormal   Collection Time: 11/20/16  6:06 PM  Result Value Ref Range   Sodium 135 135 - 145 mmol/L   Potassium 4.0 3.5 -  5.1 mmol/L   Chloride 96 (L) 101 - 111 mmol/L   BUN 17 6 - 20 mg/dL   Creatinine, Ser 1.00 0.44 - 1.00 mg/dL   Glucose, Bld 126 (H) 65 - 99 mg/dL   Calcium, Ion 1.13 (L) 1.15 - 1.40 mmol/L   TCO2 28 22 - 32 mmol/L   Hemoglobin 10.5 (L) 12.0 - 15.0 g/dL   HCT 31.0 (L) 36.0 - 46.0 %  Urinalysis, Routine w reflex microscopic     Status: Abnormal   Collection Time: 11/20/16  8:56 PM  Result Value Ref Range   Color, Urine STRAW (A) YELLOW   APPearance CLEAR CLEAR   Specific Gravity, Urine 1.013 1.005 - 1.030   pH 6.0 5.0 - 8.0   Glucose, UA NEGATIVE NEGATIVE mg/dL   Hgb urine dipstick NEGATIVE NEGATIVE   Bilirubin Urine NEGATIVE NEGATIVE   Ketones, ur NEGATIVE NEGATIVE mg/dL   Protein, ur NEGATIVE NEGATIVE mg/dL   Nitrite NEGATIVE NEGATIVE   Leukocytes, UA NEGATIVE NEGATIVE  CBG monitoring, ED     Status: Abnormal   Collection Time: 11/20/16 10:02 PM  Result Value Ref Range   Glucose-Capillary 167 (H) 65 - 99 mg/dL    Current Facility-Administered Medications  Medication Dose Route Frequency Provider Last Rate Last Dose  . acetaminophen (TYLENOL) tablet 1,000 mg  1,000 mg Oral Q6H PRN Deno Etienne, DO   1,000 mg at 11/21/16 0841  . amLODipine (NORVASC) tablet 5 mg  5 mg Oral Daily Deno Etienne, DO   5 mg at 11/21/16 9833  . apixaban (ELIQUIS) tablet 5 mg  5 mg Oral BID Deno Etienne, DO   5 mg at 11/21/16 0844  . benztropine (COGENTIN) tablet 0.5 mg  0.5 mg Oral BID Deno Etienne, DO   0.5 mg at 11/21/16 0841  . donepezil (ARICEPT) tablet 5 mg  5 mg Oral QHS Deno Etienne, DO   5 mg at 11/21/16 0044  . gabapentin (NEURONTIN) capsule 100 mg  100 mg Oral BID Deno Etienne, DO   100 mg at 11/21/16 0840  . ipratropium (ATROVENT) nebulizer solution 0.5 mg  0.5 mg Nebulization BID Deno Etienne, DO   0.5 mg at 11/21/16 0056  . levalbuterol (XOPENEX) nebulizer solution 0.63 mg  0.63 mg Inhalation Q4H PRN Deno Etienne, DO      . levothyroxine (SYNTHROID, LEVOTHROID) tablet 125 mcg  125 mcg Oral QAC breakfast Deno Etienne, DO      . lisinopril (PRINIVIL,ZESTRIL) tablet 20 mg  20 mg Oral Daily Deno Etienne, DO   20 mg at 11/21/16 8250  . lisinopril (PRINIVIL,ZESTRIL) tablet 5 mg  5 mg Oral Daily Deno Etienne, DO   5 mg at 11/21/16 5397  . magnesium oxide (MAG-OX) tablet 400 mg  400 mg Oral Daily Deno Etienne, DO   400 mg at 11/21/16 0844  . Melatonin TABS 3 mg  3 mg Oral QHS Deno Etienne, DO   3 mg at 11/21/16 0044  . memantine (NAMENDA) tablet 5 mg  5 mg Oral Daily Deno Etienne, DO   5 mg at 11/21/16 6734  . nicotine (NICODERM CQ - dosed in mg/24 hours) patch 14 mg  14 mg Transdermal Daily Deno Etienne, DO   14 mg at 11/21/16 0842  . pantoprazole (PROTONIX) EC tablet 40 mg  40 mg Oral BID Deno Etienne, DO   40 mg at 11/21/16 0840  . propranolol (INDERAL) tablet 40 mg  40 mg Oral TID Deno Etienne, DO   40 mg at 11/21/16  0845  . risperiDONE (RISPERDAL) tablet 1 mg  1 mg Oral TID Deno Etienne, DO   1 mg at 11/21/16 0841  . sodium chloride tablet 1 g  1 g Oral TID Deno Etienne, DO   1 g at 11/21/16 0845  . traZODone (DESYREL) tablet 300 mg  300 mg Oral QHS PRN Deno Etienne, DO   300 mg at 11/21/16 8099   Current Outpatient Prescriptions  Medication Sig Dispense Refill  . amLODipine (NORVASC) 5 MG tablet Take 5 mg by mouth daily.  0  . apixaban (ELIQUIS) 5 MG TABS tablet Take 1 tablet (5 mg total) by mouth 2 (two) times daily. 60 tablet 6  . benztropine (COGENTIN) 0.5 MG tablet Take 0.5 mg by mouth 2 (two) times daily.  0  . donepezil (ARICEPT) 5 MG tablet Take 1 tablet (5 mg total) by mouth at bedtime. 30 tablet 0  . gabapentin (NEURONTIN) 100 MG capsule Take 1 capsule (100 mg total) by mouth 2 (two) times daily. 60 capsule 0  . hydrocortisone (ANUSOL-HC) 25 MG suppository Place 1 suppository (25 mg total)  rectally 2 (two) times daily. For 7 days 14 suppository 0  . ipratropium (ATROVENT) 0.02 % nebulizer solution Take by nebulization 2 (two) times daily.  0  . levalbuterol (XOPENEX HFA) 45 MCG/ACT inhaler Inhale 2 puffs into the lungs every 4 (four) hours as needed for wheezing. 1 Inhaler 12  . levothyroxine (SYNTHROID, LEVOTHROID) 125 MCG tablet Take 125 mcg by mouth daily before breakfast.    . lisinopril (PRINIVIL,ZESTRIL) 10 MG tablet Take 20 mg by mouth daily.  0  . Melatonin 3 MG TABS Take 3 mg by mouth at bedtime.    . memantine (NAMENDA) 5 MG tablet Take 5 mg by mouth daily.  0  . nicotine (NICODERM CQ - DOSED IN MG/24 HOURS) 14 mg/24hr patch Place 14 mg onto the skin daily.    . pantoprazole (PROTONIX) 40 MG tablet Take 1 tablet (40 mg total) by mouth 2 (two) times daily. 60 tablet 0  . propranolol (INDERAL) 80 MG tablet Take 40 mg by mouth 3 (three) times daily.    . risperiDONE (RISPERDAL) 1 MG tablet Take 1 mg by mouth 3 (three) times daily.  0  . sodium chloride 1 g tablet Take 1 g by mouth 3 (three) times daily.    . traZODone (DESYREL) 150 MG tablet Take 300 mg by mouth at bedtime as needed. for sleep  0  . lisinopril (PRINIVIL,ZESTRIL) 5 MG tablet Take 1 tablet (5 mg total) by mouth daily. (Patient not taking: Reported on 11/20/2016) 30 tablet 11  . magnesium oxide (MAG-OX) 400 MG tablet Take 1 tablet (400 mg total) by mouth daily. (Patient not taking: Reported on 11/20/2016) 30 tablet 3    Musculoskeletal:  Unable to assess via camera   Psychiatric Specialty Exam: Physical Exam  Psychiatric: Thought content normal. Her mood appears anxious. Her speech is delayed. She is agitated, actively hallucinating and combative. Cognition and memory are impaired. She expresses impulsivity. She exhibits a depressed mood.    Review of Systems  Constitutional: Negative.   HENT: Negative.   Eyes: Negative.   Respiratory: Negative.   Gastrointestinal: Negative.   Skin: Negative.    Neurological: Negative.   Endo/Heme/Allergies: Negative.   Psychiatric/Behavioral: Positive for depression, hallucinations and memory loss. Negative for substance abuse and suicidal ideas. The patient is nervous/anxious and has insomnia.     Blood pressure (!) 127/50, pulse (!) 56, temperature 98.4  F (36.9 C), temperature source Oral, resp. rate 16, height 5\' 6"  (1.676 m), weight 59 kg (130 lb), SpO2 99 %.Body mass index is 20.98 kg/m.  General Appearance: Casual  Eye Contact:  Fair  Speech:  Clear and Coherent  Volume:  Decreased  Mood:  Anxious, Depressed and Dysphoric  Affect:  Constricted  Thought Process:  Disorganized  Orientation:  Other:  only to person but was unreliable   Thought Content:  Illogical  Suicidal Thoughts:  No  Homicidal Thoughts:  Yes.  without intent/plan towards husband  Memory:  Immediate;   Fair Recent;   Poor Remote;   Poor  Judgement:  Impaired  Insight:  Lacking  Psychomotor Activity:  Decreased  Concentration:  Concentration: Fair and Attention Span: Fair  Recall:  Poor  Fund of Knowledge:  Poor  Language:  Good  Akathisia:  No  Handed:  Right  AIMS (if indicated):     Assets:  Communication Skills Social Support Others:  family  ADL's:  Impaired  Cognition:  Impaired,  Moderate  Sleep:   poor     Treatment Plan Summary: Daily contact with patient to assess and evaluate symptoms and progress in treatment and Medication management Continue Risperdal 1 mg TID for psychosis and Trazodone for insomnia.   Disposition: Recommend psychiatric Inpatient admission when medically cleared.   This service was provided via telemedicine using a 2-way, interactive audio and video technology.  Names of all persons participating in this telemedicine service and their role in this encounter.  Name: Elmarie Shiley Role: PMHNP-C Name: Geryl Councilman Role: Nurse Tech Name: Role:  Elmarie Shiley, NP 11/21/2016 9:37 AM

## 2016-11-21 NOTE — ED Provider Notes (Addendum)
Resting comfortably in bed. Alert. Denies complaint. Stable.   Orlie Dakin, MD 11/21/16 [phone] ED ECG REPORT   Date: 11/21/2016  Rate: 50  Rhythm: normal sinus rhythm  QRS Axis: left  Intervals: normal  ST/T Wave abnormalities: nonspecific T wave changes  Conduction Disutrbances:none  Narrative Interpretation:   Old EKG Reviewed: rate slower than previous tracing  I have personally reviewed the EKG tracing and agree with the computerized printout as noted.   Orlie Dakin, MD 11/21/16 1122

## 2016-11-21 NOTE — ED Triage Notes (Signed)
Pt walked  To cabinet to spit.

## 2016-11-21 NOTE — Progress Notes (Signed)
Per Elmarie Shiley, NP, patient is recommended for inpatient gero-psych treatment.   CSW requesting patient have an EKG ordered to complete referral packet for possible inpatient gero-psych placement.   Gordan Payment, RN notified.   Radonna Ricker MSW, [facility] Disposition [phone]

## 2016-11-21 NOTE — ED Notes (Signed)
Patient was given Crackers and Coke.

## 2016-11-21 NOTE — ED Triage Notes (Signed)
PT walking in room and pulled Code alarm.

## 2016-11-21 NOTE — ED Triage Notes (Signed)
PT 's Husband  Visited at 1230 to 1300. Pt was calm while Husband  was in room. Pt became restless after Husband left. Pt started to walk in room  And  Pt took off her pants and started making baby type sounds . Pt then started talking loudly to herself.

## 2016-11-21 NOTE — ED Notes (Signed)
Pt pacing room, refusing to sit down. Keeps leaning forward, RN and sitter made several attempts at getting pt back to chair or bed, pt kept on refusing. Pt mocking and being verbally aggressive towards staff. After 5 minutes of this behavior, pt sat back in recliner and closed her eyes.

## 2016-11-21 NOTE — ED Notes (Signed)
Lunch was ordered for patient.

## 2016-11-21 NOTE — ED Notes (Signed)
Pt pacing room after phone call with husband. Pt redirected to lay in bed. Pt calm and cooperative at this time.

## 2016-11-21 NOTE — ED Triage Notes (Signed)
Pt telling Sitter to shut up after pt was asked not to pull code alarm.

## 2016-11-21 NOTE — ED Notes (Signed)
Patient was given a snack and Drink, and A Regular Diet was ordered for Dinner.

## 2016-11-21 NOTE — ED Notes (Signed)
Patient became very restless and wanted to stand.  While standing patient complained of leg pain and one point returned to the chair and put her leg up on the bedside table. Staff recommended a recliner and patient accepted.  Patient sat in the recliner and expressed that she would be able to relax a while there.

## 2016-11-21 NOTE — Progress Notes (Signed)
Patient meets criteria for inpatient treatment. CSW faxed referrals to the following inpatient facilities for review:  [facility], [facility], [facility], [facility]    TTS will continue to seek bed placement.   Radonna Ricker MSW, [place] Disposition [phone]

## 2016-11-22 ENCOUNTER — Encounter (HOSPITAL_COMMUNITY): Payer: Self-pay | Admitting: *Deleted

## 2016-11-22 DIAGNOSIS — R443 Hallucinations, unspecified: Secondary | ICD-10-CM | POA: Diagnosis not present

## 2016-11-22 MED ORDER — QUETIAPINE FUMARATE 50 MG PO TABS
50.0000 mg | ORAL_TABLET | Freq: Every day | ORAL | Status: DC
  Administered 2016-11-22 – 2016-11-25 (×4): 50 mg via ORAL
  Filled 2016-11-22 (×5): qty 1

## 2016-11-22 NOTE — ED Notes (Signed)
Pt asking RN to listen to her story re: a woman who is attracted to her husband. States she is going to call police if this woman does not leave her husband alone.

## 2016-11-22 NOTE — ED Notes (Signed)
Pt eating lunch. Spouse visiting.

## 2016-11-22 NOTE — ED Notes (Signed)
Visitor now leaving. Pt acted appropriately during visitation - advised of [facility]H of seeing 2 girls w/her.

## 2016-11-22 NOTE — ED Notes (Addendum)
Pt noted to continue w/restlessness - walking about in room - attempting to leave room - re-directed by [employer].

## 2016-11-22 NOTE — ED Notes (Signed)
Crushed pt's meds and gave w/apple sauce - pt tolerated well. Pt continues to be restless and talking continuously.

## 2016-11-22 NOTE — ED Notes (Signed)
Spouse visited w/pt - advised will keep him updated on plan for pt. States he is not able to take pt home the way she is currently.

## 2016-11-22 NOTE — ED Notes (Signed)
Pt took most of meds and then spit her water out - no meds noted. Pt refused other meds.

## 2016-11-22 NOTE — ED Notes (Signed)
Pt continues to attempt to leave room, pt taken on walk around department and laid back in bed.  Pt can be redirected back to bed.

## 2016-11-22 NOTE — ED Notes (Signed)
Female visiting w/pt.  

## 2016-11-22 NOTE — ED Provider Notes (Signed)
Resting comfortably in bed. Alert. Denies complaint   Orlie Dakin, MD 11/22/16 [phone]

## 2016-11-22 NOTE — ED Notes (Signed)
Spouse visiting w/pt.  

## 2016-11-22 NOTE — ED Notes (Signed)
Advised spouse that he may visit a few more min if he would like d/t pt appears to be in a better mood when he is present.

## 2016-11-22 NOTE — ED Notes (Signed)
Destiny Hughes, [place], aware of request for NP, [place], for med recommendations d/t per report, pt did not sleep last night, has not rested well today d/t restless.

## 2016-11-22 NOTE — ED Notes (Signed)
Pt up to rest room at this time and refusing to put on clean brief.

## 2016-11-22 NOTE — ED Notes (Signed)
Pt having increasing agitation at this time and ripping up cup in bed. Pt also attempting to pace in room and having to be helped back to bed multiple times.

## 2016-11-22 NOTE — ED Notes (Signed)
Patient was given a snack and Drink, and a Regular Diet was Taken.

## 2016-11-22 NOTE — ED Notes (Signed)
Regular Diet was ordered for Lunch. 

## 2016-11-22 NOTE — ED Notes (Signed)
Pt attempting to get up of out recliner w/o assistance. Pt assisted to bathroom - pt ambulates via walker.

## 2016-11-22 NOTE — ED Notes (Signed)
Pt noted to be restless.

## 2016-11-22 NOTE — ED Notes (Signed)
Pt speaking w/son on phone.

## 2016-11-22 NOTE — ED Notes (Signed)
Pt swallowed 1st Tylenol w/o difficulty - attempted to swallow 2nd - pt spit it out - states unable to swallow. Took pill w/apple sauce then ate apple sauce w/o difficulty afterward.

## 2016-11-22 NOTE — ED Notes (Signed)
Regular Diet ordered for Dinner. 

## 2016-11-22 NOTE — ED Notes (Signed)
Sitting in chair watching tv.

## 2016-11-22 NOTE — Progress Notes (Signed)
CSW received a phone call from [place] in admissions at [facility] facility.   Per Lenna Sciara, the patient was declined for inpatient gero-psych treatment due to patient recently being discharged from their facility a week ago.   Melissa stated that the reviewing physician recommends for the patient's prescription of Trazodone be increased.    TTS will continue to seek possible placement for gero-psych beds.   Radonna Ricker MSW, [facility] Disposition [phone]

## 2016-11-23 DIAGNOSIS — R443 Hallucinations, unspecified: Secondary | ICD-10-CM | POA: Diagnosis not present

## 2016-11-23 MED ORDER — LORAZEPAM 1 MG PO TABS
1.0000 mg | ORAL_TABLET | Freq: Once | ORAL | Status: AC
  Administered 2016-11-23: 1 mg via ORAL
  Filled 2016-11-23: qty 1

## 2016-11-23 NOTE — ED Notes (Signed)
TTS at bedside in progress. 

## 2016-11-23 NOTE — ED Notes (Signed)
Pharmacy called again regarding missing meds.  Ben, pharmacist, stated he will have meds resent.

## 2016-11-23 NOTE — ED Notes (Signed)
Family at bedside. 

## 2016-11-23 NOTE — ED Notes (Signed)
Pt awake and alert, calm and cooperative eating breakfast. Has been reevaluated by TTS just now. Pt states "that ball of anger last night just unraveled after that pill they gave me."

## 2016-11-23 NOTE — ED Notes (Signed)
Lunch at bedside.  Patient asleep.  Will warm up food when she is awake and ready to eat.

## 2016-11-23 NOTE — ED Notes (Signed)
Pharmacy notified of missing doses of propanolol and sodium chloride.

## 2016-11-23 NOTE — ED Notes (Signed)
Pt husband at bedside visiting.

## 2016-11-23 NOTE — ED Notes (Signed)
Pt eating lunch

## 2016-11-23 NOTE — ED Notes (Signed)
Pt given peanut butter, crackers, and coke for snack.

## 2016-11-23 NOTE — ED Notes (Signed)
Dinner at bedside

## 2016-11-23 NOTE — ED Notes (Addendum)
Dinner tray ordered and cookies/coke given for snack.

## 2016-11-23 NOTE — ED Notes (Signed)
Pt is agitated and not wanting anyone to help her put her clothes back on.  Tech at bedside.  Patient being very rude to staff.  Told, by this nurse, to calm down or security will be called.

## 2016-11-23 NOTE — ED Notes (Signed)
Breakfast order placed ?

## 2016-11-23 NOTE — BHH Counselor (Signed)
Reassessment-Pt appears disoriented. Pt had flight of ideas. Pt states "I can't remember anything." Pt states "My parents want me in the hospital so they can find something wrong."  TTS will continue to seek placement.  Lorenza Cambridge, [facility] Triage Specialist

## 2016-11-23 NOTE — ED Notes (Signed)
Pt took meds without issue  

## 2016-11-23 NOTE — ED Notes (Addendum)
Pt woke up to go to BR.  Used walker.  Pt given new brief, which she was able to partially put on, only needing help with securing it.  Pt asking for something for "anxiety or restlessness."  Told her I would speak to the MD about medicine, but explained that she got several medications last night and we may be limited in our options.  Will speak to EDP.

## 2016-11-24 ENCOUNTER — Encounter (HOSPITAL_COMMUNITY): Payer: Self-pay | Admitting: Registered Nurse

## 2016-11-24 DIAGNOSIS — F17210 Nicotine dependence, cigarettes, uncomplicated: Secondary | ICD-10-CM

## 2016-11-24 DIAGNOSIS — Z810 Family history of intellectual disabilities: Secondary | ICD-10-CM

## 2016-11-24 DIAGNOSIS — R4189 Other symptoms and signs involving cognitive functions and awareness: Secondary | ICD-10-CM

## 2016-11-24 DIAGNOSIS — R443 Hallucinations, unspecified: Secondary | ICD-10-CM | POA: Diagnosis not present

## 2016-11-24 DIAGNOSIS — F0631 Mood disorder due to known physiological condition with depressive features: Secondary | ICD-10-CM

## 2016-11-24 DIAGNOSIS — F0391 Unspecified dementia with behavioral disturbance: Secondary | ICD-10-CM | POA: Diagnosis not present

## 2016-11-24 DIAGNOSIS — F323 Major depressive disorder, single episode, severe with psychotic features: Secondary | ICD-10-CM

## 2016-11-24 NOTE — ED Notes (Addendum)
When this RN went to scan patient's bracelet, patient became agitated stating that her bracelet was on her ankle.  No bracelet noted, patient became argumentative with RN.  Sitter able to redirect and reassure.  Patient then cooperative, took all meds as asked.

## 2016-11-24 NOTE — Progress Notes (Signed)
CSW advised to seek possible placement for inpatient gero-psych at [facility].   CSW contacted Kennyth Lose with [facility] regarding bed availability for a gero-psych bed. Per Kennyth Lose, there are no available beds today (11/24/16).   TTS will continue to seek possible placement.   Radonna Ricker MSW, [facility] Disposition [phone]

## 2016-11-24 NOTE — ED Notes (Signed)
Pt had episode of incontinence, linen and clothes changed.

## 2016-11-24 NOTE — ED Notes (Signed)
Called pharmacy to follow up on previous request for meds

## 2016-11-24 NOTE — ED Notes (Signed)
Sitter here now , assisted pt up to ambulate to BR, now she is eating breakfast

## 2016-11-24 NOTE — ED Notes (Signed)
Patient agitated but easily re-directable.  Patient wanting to walk to bathroom with no bottoms on.  This RN and sitter encouraged patient to put pants and underwear on.  Patient agreed to underwear.  This RN reminded patient that there were other patients.  Patient agreed to underwear, but would not agree to pants.  Patient helped to bathroom and then back in to bed.

## 2016-11-24 NOTE — ED Notes (Addendum)
Patient's son called for update.  Called him back and updated.

## 2016-11-24 NOTE — Consult Note (Signed)
Telepsych Consultation   Reason for Consult:  Hallucinations Referring Physician:  Aldona Lento, MD Location of Patient: [facility] Location of Provider: [facility]  Patient Identification: Destiny Hughes MRN:  [id number] Principal Diagnosis: Major depression with psychotic features [facility]) Diagnosis:   Patient Active Problem List   Diagnosis Date Noted  . Major depression with psychotic features ([place]) [F32.3] 10/19/2016  . Dementia with psychosis [F03.91] 10/19/2016  . Depression due to physical illness [F06.31]   . Cognitive retention disorder [R41.89]   . Atrial flutter ([place]) [I48.92] 08/15/2016  . Atrial fibrillation with RVR ([place]) [I48.91] 08/15/2016  . SVT (supraventricular tachycardia) ([place]) [I47.1] 07/11/2016  . Weight loss [R63.4] 07/08/2016  . AKI (acute kidney injury) ([place]) [N17.9] 07/02/2016  . Acute kidney injury ([place]) [N17.9] 07/01/2016  . Hyperglycemia [R73.9] 07/01/2016  . Hyponatremia [E87.1] 07/01/2016  . Intractable nausea and vomiting [R11.2] 07/01/2016  . Abnormal EKG [R94.31] 07/01/2016  . Diastolic CHF ([place]) [Q11.94] 10/13/2015  . Medication management [Z79.899] 04/28/2015  . Emphysema lung ([place]) [J43.9] 12/26/2014  . GERD (gastroesophageal reflux disease) [K21.9] 12/26/2014  . Chronic bronchitis ([place]) [J42] 11/13/2014  . COPD (chronic obstructive pulmonary disease) ([place]) [J44.9] 10/03/2014  . At high risk for falls [Z91.81] 10/03/2014  . Hypothyroidism [E03.9] 10/03/2014  . Mitral valve prolapse [I34.1] 10/03/2014  . Encounter for Medicare annual wellness exam [R74.08] 10/03/2014  . IBS (irritable bowel syndrome) [K58.9] 04/01/2014  . H/O total knee replacement [Z96.659] 04/22/2013  . Essential hypertension [I10] 04/15/2013  . Vitamin D deficiency [E55.9] 04/15/2013  . Encounter for long-term (current) use of medications [Z79.899] 04/15/2013  . Prediabetes [R73.03] 04/15/2013  . Osteoarthritis [M19.90] 01/31/2011  . Hyperlipemia [E78.5]    . Glaucoma [H40.9]   . Chronic pain disorder [G89.4]   . Chronic back pain [M54.9, G89.29]     Total Time spent with patient: 45 minutes  Subjective:   Destiny Hughes is a 71 y.o. female patient brought to [facility] by her husband and son with complaints of bowl and coughing issues  HPI:  Destiny Hughes 71 y.o. 71 y.o. patient seen via telepsych by this provider, consulted with Dr Dwyane Dee, and chart reviewed on 11/24/16.   On evaluation:  Destiny Hughes is alert to self.  Patient unaware of where she is at this present time and states that she lives with her husband, son and grandchildren.  She does state that she is not sleeping at night because she needs the "mud hall" but was unable to explain what the mud hall was.  Patient not a good historian and was unable to answer most questions.  No family at bed side.  Patient did state that she did not want to kill her self or anyone else.  When asked if she was seeing or hearing things that other didn't patient responded that "they say that can't when it's right in front of their faces" but wasn't able to elaborate more.  Patient has history of Dementia and recommend that patient follow up with neurology or resources that can assist with dementia.   Patient cleared psychiatrically cleared related to appears that current symptoms is possible related to progression of dementia. She may keep her outpatient appointment with [facility] on 11/30 and continue current psychotropic medications. Patient to continue with [facility] for at home health services [phone]).     Past Psychiatric History: Depression  Risk to Self: Suicidal Ideation: No Suicidal Intent: No Is patient at risk for suicide?: Yes Suicidal Plan?:  No Access to Means: No What has been your use of drugs/alcohol within the last 12 months?: none reported How many times?: 0 Other Self Harm Risks: none reported Triggers for Past Attempts: None  known Intentional Self Injurious Behavior: None Risk to Others: Homicidal Ideation: No Thoughts of Harm to Others: No Current Homicidal Intent: No Current Homicidal Plan: No Access to Homicidal Means: No History of harm to others?: No Assessment of Violence: None Noted Violent Behavior Description: none reported Does patient have access to weapons?: No Criminal Charges Pending?: No Does patient have a court date: No Prior Inpatient Therapy: Prior Inpatient Therapy: Yes Prior Therapy Dates: 10/2016 Prior Therapy Facilty/Provider(s): [place]  Reason for Treatment: psychosis/ Dementia Prior Outpatient Therapy: Prior Outpatient Therapy: No Prior Therapy Dates: NA Prior Therapy Facilty/Provider(s): NA Reason for Treatment: NA Does patient have an ACCT team?: No Does patient have Intensive In-House Services?  : No Does patient have [facility] services? : No Does patient have P4CC services?: No  Past Medical History:  Past Medical History:  Diagnosis Date  . Chronic back pain   . Cognitive retention disorder   . Collapse of right lung   . COPD (chronic obstructive pulmonary disease) (HCC)    Hx. Bronchitis occ, 01-25-11 somes issue now taking  Z-pak-started 01-24-11/Scar tissue  present  from previous lung collapse  . Dementia with psychosis   . Depression   . Depression   . GERD (gastroesophageal reflux disease)    tx. TUMS  . Glaucoma   . Hyperlipemia   . Hypothyroidism    tx. Levothyroxine  . Major depression with psychotic features ([place])   . Nephrolithiasis   . Neuromuscular disorder ([place]) 01-25-11   Pain/nerve stimulator implanted -2 yrs ago.  Marland Kitchen Reflex, gag, absent   . Reflex, gag, absent     Past Surgical History:  Procedure Laterality Date  . A-FLUTTER ABLATION N/A 08/16/2016   Procedure: A-Flutter Ablation;  Surgeon: Evans Lance, MD;  Location: [place] [facility];  Service: Cardiovascular;  Laterality: N/A;  . APPENDECTOMY  01-25-11  . CARPAL TUNNEL RELEASE   01-25-11   [place].  . CERVICAL SPINE SURGERY  01-25-11   2005-multiple levels  . CHOLECYSTECTOMY  01-25-11   '07  . COLONOSCOPY WITH PROPOFOL N/A 07/23/2012   Procedure: COLONOSCOPY WITH PROPOFOL;  Surgeon: Garlan Fair, MD;  Location: [facility];  Service: Endoscopy;  Laterality: N/A;  . EYE SURGERY    . JOINT REPLACEMENT  01-25-11   6'03 LTHA-hemi  . KNEE ARTHROPLASTY  01-25-11   '04-right, revised x2  . no gag reflex     Pt does not havea gag reflex following  siurgery to neck. Family unsure of date. Pt takes pill in apple sauce.  . SPINAL CORD STIMULATOR IMPLANT  2009 APPROX   DR. ELSNER  . THORACIC SPINE SURGERY  01-25-11   6'02- then nerve stimulator implanted after  . TOTAL KNEE REVISION  02/01/2011   Procedure: TOTAL KNEE REVISION;  Surgeon: Mauri Pole;  Location: [facility];  Service: Orthopedics;  Laterality: Right;  . TUBAL LIGATION     Family History:  Family History  Problem Relation Age of Onset  . Heart attack Father   . Emphysema Father   . Aneurysm Mother        CEREBRAL  . Emphysema Mother   . Dementia Mother   . Diabetes Son   . Hypertension Son   . Hypertension Son   . Cancer Sister  Widely Metastatic  . Asthma Son        x2  . Breast cancer Paternal Aunt   . Rheum arthritis Maternal Aunt    Family Psychiatric  History: Unaware Social History:  History  Alcohol Use No    Comment: none in 10 years     History  Drug Use No    Social History   Social History  . Marital status: Married    Spouse name: N/A  . Number of children: 2  . Years of education: N/A   Occupational History  . disabled    Social History Main Topics  . Smoking status: Current Every Day Smoker    Packs/day: 0.75    Years: 50.00    Types: Cigarettes    Start date: 09/13/1961  . Smokeless tobacco: Never Used     Comment: Decreased Down to 2/3 a Day if Thinking Abouit It  . Alcohol use No     Comment: none in 10 years  . Drug use: No  . Sexual activity: No    Other Topics Concern  . None   Social History Narrative   She is from [place] [facility] [place]. She has always lived in [place]. She has traveled to [place], [place], [place], [place], & [place]. 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 Prior exposure to parakeets with last exposure remote in her current home. Previously had mold in her home that was in her bathroom & fixed. Prior exposure to hot tubs but none recently. Pt lives at home with [name], husband.  Has 2 childrean, 12th grade education.     Additional Social History:    Allergies:   Allergies  Allergen Reactions  . Albuterol Other (See Comments)    Had difficulty breathing after a nebulizer treatment  . Penicillins Anaphylaxis and Swelling    Has patient had a PCN reaction causing immediate rash, facial/tongue/throat swelling, SOB or lightheadedness with hypotension: Yes Has patient had a PCN reaction causing severe rash involving mucus membranes or skin necrosis: Rash Has patient had a PCN reaction that required hospitalization: No Has patient had a PCN reaction occurring within the last 10 years: No If all of the above answers are "NO", then may proceed with Cephalosporin use.  [name] [Kdc:Albuterol] Shortness Of Breath  . Sulfa Antibiotics Swelling    Throat swells, but no shortness of breath noted  . Zanaflex [Tizanidine] Other (See Comments)    Possible confusion (??)  . Codeine Nausea And Vomiting  . Ecotrin [Aspirin] Nausea And Vomiting    Labs: No results found for this or any previous visit (from the past 48 hour(s)).  Medications:  Current Facility-Administered Medications  Medication Dose Route Frequency Provider Last Rate Last Dose  . acetaminophen (TYLENOL) tablet 1,000 mg  1,000 mg Oral Q6H PRN Deno Etienne, DO   1,000 mg at 11/24/16 1343  . amLODipine (NORVASC) tablet 5 mg  5 mg Oral Daily Deno Etienne, DO   5 mg at 11/24/16 1042   . apixaban (ELIQUIS) tablet 5 mg  5 mg Oral BID Deno Etienne, DO   5 mg at 11/24/16 1046  . benztropine (COGENTIN) tablet 0.5 mg  0.5 mg Oral BID Deno Etienne, DO   0.5 mg at 11/24/16 1042  . donepezil (ARICEPT) tablet 5 mg  5 mg Oral QHS Deno Etienne, DO   5 mg at 11/23/16 2121  .  gabapentin (NEURONTIN) capsule 100 mg  100 mg Oral BID Deno Etienne, DO   100 mg at 11/24/16 1044  . ipratropium (ATROVENT) nebulizer solution 0.5 mg  0.5 mg Nebulization BID Deno Etienne, DO   0.5 mg at 11/24/16 1054  . levalbuterol (XOPENEX) nebulizer solution 0.63 mg  0.63 mg Inhalation Q4H PRN Deno Etienne, DO      . levothyroxine (SYNTHROID, LEVOTHROID) tablet 125 mcg  125 mcg Oral QAC breakfast Deno Etienne, DO   125 mcg at 11/24/16 1041  . lisinopril (PRINIVIL,ZESTRIL) tablet 20 mg  20 mg Oral Daily Deno Etienne, DO   20 mg at 11/24/16 1043  . magnesium oxide (MAG-OX) tablet 400 mg  400 mg Oral Daily Deno Etienne, DO   400 mg at 11/23/16 1021  . Melatonin TABS 3 mg  3 mg Oral QHS Deno Etienne, DO   3 mg at 11/22/16 2121  . memantine (NAMENDA) tablet 5 mg  5 mg Oral Daily Deno Etienne, DO   5 mg at 11/24/16 1043  . nicotine (NICODERM CQ - dosed in mg/24 hours) patch 14 mg  14 mg Transdermal Daily Deno Etienne, DO   14 mg at 11/24/16 1343  . pantoprazole (PROTONIX) EC tablet 40 mg  40 mg Oral BID Deno Etienne, DO   40 mg at 11/24/16 1041  . propranolol (INDERAL) tablet 40 mg  40 mg Oral TID Deno Etienne, DO   40 mg at 11/24/16 1053  . QUEtiapine (SEROQUEL) tablet 50 mg  50 mg Oral QHS Lindon Romp A, NP   50 mg at 11/23/16 2122  . risperiDONE (RISPERDAL) tablet 1 mg  1 mg Oral TID Deno Etienne, DO   1 mg at 11/24/16 1042  . sodium chloride tablet 1 g  1 g Oral TID Deno Etienne, DO   1 g at 11/24/16 1046  . traZODone (DESYREL) tablet 300 mg  300 mg Oral QHS PRN Deno Etienne, DO   300 mg at 11/22/16 2113   Current Outpatient Prescriptions  Medication Sig Dispense Refill  . amLODipine (NORVASC) 5 MG tablet Take 5 mg by mouth daily.  0  . apixaban  (ELIQUIS) 5 MG TABS tablet Take 1 tablet (5 mg total) by mouth 2 (two) times daily. 60 tablet 6  . benztropine (COGENTIN) 0.5 MG tablet Take 0.5 mg by mouth 2 (two) times daily.  0  . donepezil (ARICEPT) 5 MG tablet Take 1 tablet (5 mg total) by mouth at bedtime. 30 tablet 0  . gabapentin (NEURONTIN) 100 MG capsule Take 1 capsule (100 mg total) by mouth 2 (two) times daily. 60 capsule 0  . hydrocortisone (ANUSOL-HC) 25 MG suppository Place 1 suppository (25 mg total) rectally 2 (two) times daily. For 7 days 14 suppository 0  . ipratropium (ATROVENT) 0.02 % nebulizer solution Take by nebulization 2 (two) times daily.  0  . levalbuterol (XOPENEX HFA) 45 MCG/ACT inhaler Inhale 2 puffs into the lungs every 4 (four) hours as needed for wheezing. 1 Inhaler 12  . levothyroxine (SYNTHROID, LEVOTHROID) 125 MCG tablet Take 125 mcg by mouth daily before breakfast.    . lisinopril (PRINIVIL,ZESTRIL) 10 MG tablet Take 20 mg by mouth daily.  0  . Melatonin 3 MG TABS Take 3 mg by mouth at bedtime.    . memantine (NAMENDA) 5 MG tablet Take 5 mg by mouth daily.  0  . nicotine (NICODERM CQ - DOSED IN MG/24 HOURS) 14 mg/24hr patch Place 14 mg onto the skin daily.    Marland Kitchen  pantoprazole (PROTONIX) 40 MG tablet Take 1 tablet (40 mg total) by mouth 2 (two) times daily. 60 tablet 0  . propranolol (INDERAL) 80 MG tablet Take 40 mg by mouth 3 (three) times daily.    . risperiDONE (RISPERDAL) 1 MG tablet Take 1 mg by mouth 3 (three) times daily.  0  . sodium chloride 1 g tablet Take 1 g by mouth 3 (three) times daily.    . traZODone (DESYREL) 150 MG tablet Take 300 mg by mouth at bedtime as needed. for sleep  0  . lisinopril (PRINIVIL,ZESTRIL) 5 MG tablet Take 1 tablet (5 mg total) by mouth daily. (Patient not taking: Reported on 11/20/2016) 30 tablet 11  . magnesium oxide (MAG-OX) 400 MG tablet Take 1 tablet (400 mg total) by mouth daily. (Patient not taking: Reported on 11/20/2016) 30 tablet 3    Musculoskeletal: Strength &  Muscle Tone: Unable to evaluate at this time Gait & Station: Did not see patient ambulate Patient leans: N/A  Psychiatric Specialty Exam: Physical Exam  ROS  Blood pressure (!) 123/51, pulse 60, temperature 97.7 F (36.5 C), temperature source Oral, resp. rate 17, height 5\' 6"  (1.676 m), weight 59 kg (130 lb), SpO2 99 %.Body mass index is 20.98 kg/m.  General Appearance: Disheveled  Eye Contact:  None  Speech:  Garbled and Slow  Volume:  Decreased  Mood:  Unable to determine at this time  Affect:  Flat  Thought Process:  Disorganized  Orientation:  Other:  To person only  Thought Content:  Unable to determine at this time  Suicidal Thoughts:  No  Homicidal Thoughts:  No  Memory:  Immediate;   Poor Recent;   Poor Remote;   Poor  Judgement:  Impaired  Insight:  Unable to determine  Psychomotor Activity:  Decreased and Weakness  Concentration:  Concentration: Poor and Attention Span: Poor  Recall:  Poor  Fund of Knowledge:  Poor  Language:  Poor  Akathisia:  No  Handed:  Right  AIMS (if indicated):     Assets:  Social Support  ADL's:  Impaired  Cognition:  Impaired,  Severe  Sleep:        Treatment Plan Summary: Daily contact with patient to assess and evaluate symptoms and progress in treatment, Medication management and Plan Cleared psychiatrically; referral to neurology  Disposition: No evidence of imminent risk to self or others at present.   Patient does not meet criteria for psychiatric inpatient admission.  This service was provided via telemedicine using a 2-way, interactive audio and video technology.  Names of all persons participating in this telemedicine service and their role in this encounter. Name: Earleen Newport NP Role: Telepsych  Name: Dr Dwyane Dee Role: Psychiatrist  Name:  Role:   Name:  Role:     Earleen Newport, NP 11/24/2016 2:11 PM

## 2016-11-24 NOTE — ED Notes (Signed)
Pt in room, pt ripped shirt of twice, walking around room, threw cabinets and in hallways. Compliant with staff, needs redirection.

## 2016-11-24 NOTE — ED Notes (Signed)
Patient ambulated to rest room with sitter, gait steady.

## 2016-11-24 NOTE — ED Notes (Signed)
Patient up, walking away from sitter in hallway.  This RN stood to assist, sitter monitoring patient and walking with patient. Patient offered walker, which she refused.  Patient offered wheelchair ride back to her room which patient refused.  Patient states "I'm just going to stand here until I fall".  Patient began to be irritated with sitter staying so close to her in the hallway.  Patient became agitated and began to grab items on the counter.  Patient's family arrived during episode, helped to escort patient back to her room.  Patient back in room, calm, talking with family at this time

## 2016-11-24 NOTE — ED Notes (Signed)
Patient became aggressive with sitter and needed to be redirected.  Patient backed in to another patient's room.  This RN able to redirect and patient's family witnessed incident.  Patient back in room calmly speaking to family at this time.

## 2016-11-24 NOTE — ED Notes (Signed)
Dinner tray ordered.

## 2016-11-25 DIAGNOSIS — R443 Hallucinations, unspecified: Secondary | ICD-10-CM | POA: Diagnosis not present

## 2016-11-25 NOTE — ED Notes (Signed)
Patient was given Catheryn Bacon, Peanut Butter, and Ginger Ale, and A Regular Diet was ordered for Lunch.

## 2016-11-25 NOTE — ED Notes (Signed)
Patient was given a Cup of Sprite.

## 2016-11-25 NOTE — ED Notes (Signed)
Pt up walking around room, stopping occasionally to press nose against wall.

## 2016-11-25 NOTE — ED Notes (Signed)
Pt continually leaving room and not responding well to staff instructions to return to room.

## 2016-11-25 NOTE — ED Notes (Signed)
Patient ate %100 of her Lunch

## 2016-11-25 NOTE — ED Notes (Signed)
Lunch tray delivered.

## 2016-11-25 NOTE — ED Notes (Signed)
Patient was given Catheryn Bacon.

## 2016-11-25 NOTE — ED Notes (Signed)
Regular Diet ordered for Dinner. 

## 2016-11-26 DIAGNOSIS — R443 Hallucinations, unspecified: Secondary | ICD-10-CM | POA: Diagnosis not present

## 2016-11-26 MED ORDER — OLANZAPINE 5 MG PO TABS: 5.0000 mg | ORAL_TABLET | Freq: Every day | ORAL | 0 refills | Status: DC

## 2016-11-26 MED ORDER — OLANZAPINE 5 MG PO TBDP
5.0000 mg | ORAL_TABLET | Freq: Every day | ORAL | Status: DC
  Administered 2016-11-26: 5 mg via ORAL
  Filled 2016-11-26: qty 1

## 2016-11-26 NOTE — ED Notes (Signed)
Dr Ralene Bathe advised she will still be a few min. Family aware of delay.

## 2016-11-26 NOTE — ED Notes (Signed)
Pt noted to be soiled w/urine upon awakening. NT assisted w/cleansing pt and changing bed linen. Pt noted to be irritable and confused. NT placed draw sheet on bed - pt wanted a blanket placed on top of this and placed a towel on top of blanket. Pt then asking for blanket - additional blanket given to pt.

## 2016-11-26 NOTE — ED Notes (Signed)
Family leaving at this time - advised them RN spoke w/Hannah, SW, and Dr Ralene Bathe re: Telepsych note entered on 11/24/16 - stating pt is psych cleared. Family advised they have not been notified. State they are unable to provide care for pt at home - requesting for pt to be placed in nursing facility if not going to psych. Advised will keep them updated on tx plan - spouse [phone] hm and [phone] cell.

## 2016-11-26 NOTE — ED Notes (Signed)
Destiny Hughes, SW, aware family w/pt.

## 2016-11-26 NOTE — Progress Notes (Signed)
LCSW following for disposition  Discussed with RN patient medically stable/psychiatriclaly stable for discharge today from ED Family reporting they cannot take care of her.  Will meet with family around 1pm today to finalize plans. [place] reports patient is psychiatrically cleared.  Lane Hacker, MSW Clinical Social Work: [employer]maker Coverage for :  [phone]

## 2016-11-26 NOTE — ED Notes (Addendum)
Family aware waiting for Dr Ralene Bathe to perform d/c paperwork. Pt lying on bed - sleeps for short periods then attempts to get up from bed.

## 2016-11-26 NOTE — ED Notes (Signed)
D/C paperwork given to spouse - Sign pad not working. Voiced understanding of d/c instructions and questions answered to satisfaction of spouse and daughter.

## 2016-11-26 NOTE — ED Provider Notes (Signed)
Pt discharged home in care of family.  She has been cleared by psychiatry.  Zyprexa started per Psychiatry recommendations.     Quintella Reichert, MD 11/26/16 [phone]

## 2016-11-26 NOTE — ED Notes (Addendum)
Spouse and daughter-in-law visiting w/pt. Advised they spoke w/SW who advised them she will be in to see them during visitation time.

## 2016-11-26 NOTE — ED Notes (Signed)
Spouse and daughter-in-law visiting w/pt.

## 2016-11-26 NOTE — ED Notes (Signed)
Breakfast tray ordered 

## 2016-11-26 NOTE — Progress Notes (Signed)
LCSW met with family at bedside. Discussed concerns regarding patient going back home and their plans for placement. LCSW provided education regarding why placement was not able to be completed at this time in ED. Patient primary dx Dementia with behaviors   Family voiced frustration and worry with taking patient home. LCSW assessed for additional supports in which it is husband, son, and daughter. Daughter reports patient has been declining since March of this year and went to [place] for geri placement.  Completed 3 weeks and then released to come right back to hospital.  LCSW explored next steps with placement and ALF. Patient needs locked unit for memory care. She is independent with walking and ADLs but needs 24 hour supervision. She is confused and observed to get upset/frustrated quickly. She can be redirected.  LCSW provided husband and daughter information regarding ALFs, locked units, and private duty sitter list.  Family will take patient home this evening and plan to follow up on their own accord for memory care. LCSW also provided education to apply for medicaid and Special Assistance medicaid for placement.  No other needs at this time. Family voices understanding of information.  Lane Hacker, MSW Clinical Social Work: [employer] Coverage for :  [phone]

## 2016-11-26 NOTE — ED Notes (Signed)
In room to get vitals.  Patient noted to be hostile and uncooperative.

## 2016-11-26 NOTE — ED Notes (Signed)
Destiny Hughes, SW, in w/pt and family.

## 2016-11-26 NOTE — ED Notes (Signed)
Verified w/Tina, NP, [facility] - rx for Zyprexa 5mg  QHS needed for Dr Ralene Bathe.

## 2016-12-02 DIAGNOSIS — F29 Unspecified psychosis not due to a substance or known physiological condition: Secondary | ICD-10-CM | POA: Diagnosis not present

## 2016-12-02 DIAGNOSIS — M545 Low back pain: Secondary | ICD-10-CM | POA: Diagnosis not present

## 2016-12-02 DIAGNOSIS — I10 Essential (primary) hypertension: Secondary | ICD-10-CM | POA: Diagnosis not present

## 2016-12-02 DIAGNOSIS — F0391 Unspecified dementia with behavioral disturbance: Secondary | ICD-10-CM | POA: Diagnosis not present

## 2016-12-02 DIAGNOSIS — I480 Paroxysmal atrial fibrillation: Secondary | ICD-10-CM | POA: Diagnosis not present

## 2016-12-02 DIAGNOSIS — J449 Chronic obstructive pulmonary disease, unspecified: Secondary | ICD-10-CM | POA: Diagnosis not present

## 2016-12-12 ENCOUNTER — Encounter: Payer: Self-pay | Admitting: *Deleted

## 2016-12-12 ENCOUNTER — Other Ambulatory Visit: Payer: Self-pay | Admitting: *Deleted

## 2016-12-12 NOTE — Patient Outreach (Signed)
Telephone follow up for co-worker. Destiny Hughes had multiple ED visits through August for mental health issues. She was finally stablized and sent home with her family. I spoke to Destiny Hughes today and he reports they now have a sitter and this is working out well and they are continuing to pursue [facility] as an option.  I have advised that I will close her case today but they may still call me for future needs.  [name]. [name], MSN, [facility] Gerontological Nurse Practitioner [facility] [phone]

## 2016-12-12 NOTE — Patient Outreach (Signed)
  Review previous note.

## 2016-12-23 DIAGNOSIS — J449 Chronic obstructive pulmonary disease, unspecified: Secondary | ICD-10-CM | POA: Diagnosis not present

## 2016-12-23 DIAGNOSIS — F0391 Unspecified dementia with behavioral disturbance: Secondary | ICD-10-CM | POA: Diagnosis not present

## 2016-12-23 DIAGNOSIS — I479 Paroxysmal tachycardia, unspecified: Secondary | ICD-10-CM | POA: Diagnosis not present

## 2016-12-23 DIAGNOSIS — E871 Hypo-osmolality and hyponatremia: Secondary | ICD-10-CM | POA: Diagnosis not present

## 2016-12-23 DIAGNOSIS — F329 Major depressive disorder, single episode, unspecified: Secondary | ICD-10-CM | POA: Diagnosis not present

## 2016-12-23 DIAGNOSIS — E039 Hypothyroidism, unspecified: Secondary | ICD-10-CM | POA: Diagnosis not present

## 2016-12-23 DIAGNOSIS — Z741 Need for assistance with personal care: Secondary | ICD-10-CM | POA: Diagnosis not present

## 2016-12-23 LAB — HEPATIC FUNCTION PANEL
ALT: 9 (ref 7–35)
AST: 11 — AB (ref 13–35)

## 2016-12-23 LAB — CBC AND DIFFERENTIAL
HEMATOCRIT: 35 — AB (ref 36–46)
Hemoglobin: 11.4 — AB (ref 12.0–16.0)
WBC: 7.3

## 2016-12-23 LAB — BASIC METABOLIC PANEL
Creatinine: 0.9 (ref 0.5–1.1)
POTASSIUM: 4.9 (ref 3.4–5.3)
SODIUM: 136 — AB (ref 137–147)

## 2016-12-26 ENCOUNTER — Ambulatory Visit: Payer: Medicare Other | Admitting: Diagnostic Neuroimaging

## 2016-12-28 DIAGNOSIS — I10 Essential (primary) hypertension: Secondary | ICD-10-CM | POA: Diagnosis not present

## 2016-12-28 DIAGNOSIS — M545 Low back pain: Secondary | ICD-10-CM | POA: Diagnosis not present

## 2016-12-28 DIAGNOSIS — F29 Unspecified psychosis not due to a substance or known physiological condition: Secondary | ICD-10-CM | POA: Diagnosis not present

## 2016-12-28 DIAGNOSIS — I480 Paroxysmal atrial fibrillation: Secondary | ICD-10-CM | POA: Diagnosis not present

## 2016-12-28 DIAGNOSIS — F0391 Unspecified dementia with behavioral disturbance: Secondary | ICD-10-CM | POA: Diagnosis not present

## 2016-12-28 DIAGNOSIS — J449 Chronic obstructive pulmonary disease, unspecified: Secondary | ICD-10-CM | POA: Diagnosis not present

## 2017-01-05 DIAGNOSIS — J449 Chronic obstructive pulmonary disease, unspecified: Secondary | ICD-10-CM | POA: Diagnosis not present

## 2017-01-05 DIAGNOSIS — M545 Low back pain: Secondary | ICD-10-CM | POA: Diagnosis not present

## 2017-01-05 DIAGNOSIS — I10 Essential (primary) hypertension: Secondary | ICD-10-CM | POA: Diagnosis not present

## 2017-01-05 DIAGNOSIS — F29 Unspecified psychosis not due to a substance or known physiological condition: Secondary | ICD-10-CM | POA: Diagnosis not present

## 2017-01-05 DIAGNOSIS — F0391 Unspecified dementia with behavioral disturbance: Secondary | ICD-10-CM | POA: Diagnosis not present

## 2017-01-05 DIAGNOSIS — I480 Paroxysmal atrial fibrillation: Secondary | ICD-10-CM | POA: Diagnosis not present

## 2017-01-12 DIAGNOSIS — I480 Paroxysmal atrial fibrillation: Secondary | ICD-10-CM | POA: Diagnosis not present

## 2017-01-12 DIAGNOSIS — M545 Low back pain: Secondary | ICD-10-CM | POA: Diagnosis not present

## 2017-01-12 DIAGNOSIS — F0391 Unspecified dementia with behavioral disturbance: Secondary | ICD-10-CM | POA: Diagnosis not present

## 2017-01-12 DIAGNOSIS — J449 Chronic obstructive pulmonary disease, unspecified: Secondary | ICD-10-CM | POA: Diagnosis not present

## 2017-01-12 DIAGNOSIS — F29 Unspecified psychosis not due to a substance or known physiological condition: Secondary | ICD-10-CM | POA: Diagnosis not present

## 2017-01-12 DIAGNOSIS — I10 Essential (primary) hypertension: Secondary | ICD-10-CM | POA: Diagnosis not present

## 2017-01-13 DIAGNOSIS — F17210 Nicotine dependence, cigarettes, uncomplicated: Secondary | ICD-10-CM | POA: Diagnosis not present

## 2017-01-13 DIAGNOSIS — F0391 Unspecified dementia with behavioral disturbance: Secondary | ICD-10-CM | POA: Diagnosis not present

## 2017-01-13 DIAGNOSIS — I480 Paroxysmal atrial fibrillation: Secondary | ICD-10-CM | POA: Diagnosis not present

## 2017-01-13 DIAGNOSIS — M545 Low back pain: Secondary | ICD-10-CM | POA: Diagnosis not present

## 2017-01-13 DIAGNOSIS — J449 Chronic obstructive pulmonary disease, unspecified: Secondary | ICD-10-CM | POA: Diagnosis not present

## 2017-01-13 DIAGNOSIS — I10 Essential (primary) hypertension: Secondary | ICD-10-CM | POA: Diagnosis not present

## 2017-01-17 DIAGNOSIS — I10 Essential (primary) hypertension: Secondary | ICD-10-CM | POA: Diagnosis not present

## 2017-01-17 DIAGNOSIS — I480 Paroxysmal atrial fibrillation: Secondary | ICD-10-CM | POA: Diagnosis not present

## 2017-01-17 DIAGNOSIS — F17210 Nicotine dependence, cigarettes, uncomplicated: Secondary | ICD-10-CM | POA: Diagnosis not present

## 2017-01-17 DIAGNOSIS — J449 Chronic obstructive pulmonary disease, unspecified: Secondary | ICD-10-CM | POA: Diagnosis not present

## 2017-01-17 DIAGNOSIS — F0391 Unspecified dementia with behavioral disturbance: Secondary | ICD-10-CM | POA: Diagnosis not present

## 2017-01-17 DIAGNOSIS — M545 Low back pain: Secondary | ICD-10-CM | POA: Diagnosis not present

## 2017-01-24 DIAGNOSIS — M545 Low back pain: Secondary | ICD-10-CM | POA: Diagnosis not present

## 2017-01-24 DIAGNOSIS — I480 Paroxysmal atrial fibrillation: Secondary | ICD-10-CM | POA: Diagnosis not present

## 2017-01-24 DIAGNOSIS — I10 Essential (primary) hypertension: Secondary | ICD-10-CM | POA: Diagnosis not present

## 2017-01-24 DIAGNOSIS — F17210 Nicotine dependence, cigarettes, uncomplicated: Secondary | ICD-10-CM | POA: Diagnosis not present

## 2017-01-24 DIAGNOSIS — J449 Chronic obstructive pulmonary disease, unspecified: Secondary | ICD-10-CM | POA: Diagnosis not present

## 2017-01-24 DIAGNOSIS — F0391 Unspecified dementia with behavioral disturbance: Secondary | ICD-10-CM | POA: Diagnosis not present

## 2017-01-31 DIAGNOSIS — I10 Essential (primary) hypertension: Secondary | ICD-10-CM | POA: Diagnosis not present

## 2017-01-31 DIAGNOSIS — F17210 Nicotine dependence, cigarettes, uncomplicated: Secondary | ICD-10-CM | POA: Diagnosis not present

## 2017-01-31 DIAGNOSIS — F0391 Unspecified dementia with behavioral disturbance: Secondary | ICD-10-CM | POA: Diagnosis not present

## 2017-01-31 DIAGNOSIS — I480 Paroxysmal atrial fibrillation: Secondary | ICD-10-CM | POA: Diagnosis not present

## 2017-01-31 DIAGNOSIS — M545 Low back pain: Secondary | ICD-10-CM | POA: Diagnosis not present

## 2017-01-31 DIAGNOSIS — J449 Chronic obstructive pulmonary disease, unspecified: Secondary | ICD-10-CM | POA: Diagnosis not present

## 2017-02-07 DIAGNOSIS — J449 Chronic obstructive pulmonary disease, unspecified: Secondary | ICD-10-CM | POA: Diagnosis not present

## 2017-02-07 DIAGNOSIS — F17210 Nicotine dependence, cigarettes, uncomplicated: Secondary | ICD-10-CM | POA: Diagnosis not present

## 2017-02-07 DIAGNOSIS — I10 Essential (primary) hypertension: Secondary | ICD-10-CM | POA: Diagnosis not present

## 2017-02-07 DIAGNOSIS — M545 Low back pain: Secondary | ICD-10-CM | POA: Diagnosis not present

## 2017-02-07 DIAGNOSIS — F0391 Unspecified dementia with behavioral disturbance: Secondary | ICD-10-CM | POA: Diagnosis not present

## 2017-02-07 DIAGNOSIS — I480 Paroxysmal atrial fibrillation: Secondary | ICD-10-CM | POA: Diagnosis not present

## 2017-02-15 ENCOUNTER — Ambulatory Visit (INDEPENDENT_AMBULATORY_CARE_PROVIDER_SITE_OTHER): Payer: Medicare Other | Admitting: Psychiatry

## 2017-02-15 ENCOUNTER — Encounter (HOSPITAL_COMMUNITY): Payer: Self-pay | Admitting: Psychiatry

## 2017-02-15 VITALS — BP 126/70 | HR 60 | Ht 64.75 in | Wt 152.0 lb

## 2017-02-15 DIAGNOSIS — Z87891 Personal history of nicotine dependence: Secondary | ICD-10-CM

## 2017-02-15 DIAGNOSIS — F05 Delirium due to known physiological condition: Secondary | ICD-10-CM

## 2017-02-15 DIAGNOSIS — F0390 Unspecified dementia without behavioral disturbance: Secondary | ICD-10-CM | POA: Diagnosis not present

## 2017-02-15 DIAGNOSIS — F0392 Unspecified dementia, unspecified severity, with psychotic disturbance: Secondary | ICD-10-CM

## 2017-02-15 DIAGNOSIS — I6529 Occlusion and stenosis of unspecified carotid artery: Secondary | ICD-10-CM | POA: Diagnosis not present

## 2017-02-15 DIAGNOSIS — Z810 Family history of intellectual disabilities: Secondary | ICD-10-CM

## 2017-02-15 NOTE — Progress Notes (Signed)
Psychiatric Initial Adult Assessment   Patient Identification: Destiny Hughes MRN:  [id number] Date of Evaluation:  02/15/2017 Referral Source: [facility] Chief Complaint:   Chief Complaint    Other     Visit Diagnosis: No diagnosis found.  History of Present Illness: this patient is a 71 year old married female who seen together with her husband Destiny Hughes and her son Destiny Hughes. The patient is been out of the [facility] emergency room and home for the last one month. She was there for approximately one week receiving psychiatric services. A few months before that experienced her first admission to [facility] psychiatric hospital for visual hallucinations agitation and other features of delirium. They were told that she had a electrolyte imbalance. She stayed at [facility] for 2 weeks was started on a number of medications. The patient is been married over 39 years. She seems to have a stable marriage. She is Stable Marriage. She has no grandchildren.In her descriptions of her history the patient is showing clear signs memory impairment. For example she believes that she has grandchildren when her husband assures [place] she does not. The patient denies being depressed. Her family says she's quiet doesn't talk very much. They do not report that she crying or complaining of being depressed. The patient is sleeping well in the last few weeks has become much more erratic. She sleeps very is constant day. Her husband is always with her. The patient's appetite is very good. She's got a reasonable amount of energy. She denies feeling worthless. She's not suicidal now and never has been. She enjoys watching television and listening to music. The patient has no history of alcohol use. There is no evidence of a history of mania. Apparently she was very agitated before she went to [place] but I suspect is related to delirium. Nonetheless at this time she shows no evidence of euphoria or  irritability. She is not agitated or violent. She is calm and pleasant she is cooperative. Her family says she's doing very well. She's not oversedated. Financially she stable and the only stress he can think of is her chronic knee pain in the death of her sister in May 02, 2022. The patient has atrial fibrillation and is on a blood thinner. She's had ablation the past. She has hypertension and thyroid disease. The patient is essentially has no psychiatric history up until this year when she went to [place].She never seen a psychiatrist or ever been in therapy. Infectious never been on any psychiatric medications. In the hospital they placed her on Zyprexa 5 mg at night Risperdal 1 mg 3 times a day Cogentin 0.5 mg twice a day and trazodone which she's been taken off of. She's also low dose of Namenda 5 mg a day and some Aricept.  Associated Signs/Symptoms: Depression Symptoms:  fatigue, (Hypo) Manic Symptoms:   Anxiety Symptoms:   Psychotic Symptoms:   PTSD Symptoms:   Past Psychiatric History: none  Previous Psychotropic Medications: Yes   Substance Abuse History in the last 12 months:  No.  Consequences of Substance Abuse:   Past Medical History:  Past Medical History:  Diagnosis Date  . Chronic back pain   . Cognitive retention disorder   . Collapse of right lung   . COPD (chronic obstructive pulmonary disease) (HCC)    Hx. Bronchitis occ, 01-25-11 somes issue now taking  Z-pak-started 01-24-11/Scar tissue  present  from previous lung collapse  . Dementia with psychosis   . Depression   . Depression   .  GERD (gastroesophageal reflux disease)    tx. TUMS  . Glaucoma   . Hyperlipemia   . Hypothyroidism    tx. Levothyroxine  . Major depression with psychotic features ([place])   . Nephrolithiasis   . Neuromuscular disorder ([place]) 01-25-11   Pain/nerve stimulator implanted -2 yrs ago.  Marland Kitchen Reflex, gag, absent   . Reflex, gag, absent     Past Surgical History:  Procedure Laterality  Date  . A-FLUTTER ABLATION N/A 08/16/2016   Procedure: A-Flutter Ablation;  Surgeon: Evans Lance, MD;  Location: [place] [facility];  Service: Cardiovascular;  Laterality: N/A;  . APPENDECTOMY  01-25-11  . CARPAL TUNNEL RELEASE  01-25-11   [place].  . CERVICAL SPINE SURGERY  01-25-11   2005-multiple levels  . CHOLECYSTECTOMY  01-25-11   '07  . COLONOSCOPY WITH PROPOFOL N/A 07/23/2012   Procedure: COLONOSCOPY WITH PROPOFOL;  Surgeon: Garlan Fair, MD;  Location: [facility];  Service: Endoscopy;  Laterality: N/A;  . EYE SURGERY    . JOINT REPLACEMENT  01-25-11   6'03 LTHA-hemi  . KNEE ARTHROPLASTY  01-25-11   '04-right, revised x2  . no gag reflex     Pt does not havea gag reflex following  siurgery to neck. Family unsure of date. Pt takes pill in apple sauce.  . SPINAL CORD STIMULATOR IMPLANT  2009 APPROX   DR. ELSNER  . THORACIC SPINE SURGERY  01-25-11   6'02- then nerve stimulator implanted after  . TOTAL KNEE REVISION  02/01/2011   Procedure: TOTAL KNEE REVISION;  Surgeon: Mauri Pole;  Location: [facility];  Service: Orthopedics;  Laterality: Right;  . TUBAL LIGATION      Family Psychiatric History:   Family History:  Family History  Problem Relation Age of Onset  . Heart attack Father   . Emphysema Father   . Aneurysm Mother        CEREBRAL  . Emphysema Mother   . Dementia Mother   . Diabetes Son   . Hypertension Son   . Hypertension Son   . Cancer Sister        Widely Metastatic  . Asthma Son        x2  . Breast cancer Paternal Aunt   . Rheum arthritis Maternal Aunt     Social History:   Social History   Socioeconomic History  . Marital status: Married    Spouse name: None  . Number of children: 2  . Years of education: None  . Highest education level: None  Social Needs  . Financial resource strain: None  . Food insecurity - worry: None  . Food insecurity - inability: None  . Transportation needs - medical: None  . Transportation needs - non-medical:  None  Occupational History  . Occupation: disabled  Tobacco Use  . Smoking status: Former Smoker    Packs/day: 0.75    Years: 50.00    Pack years: 37.50    Types: Cigarettes    Start date: 09/13/1961    Last attempt to quit: 05/2016    Years since quitting: 0.7  . Smokeless tobacco: Never Used  . Tobacco comment: Decreased Down to 2/3 a Day if Thinking Abouit It  Substance and Sexual Activity  . Alcohol use: No    Alcohol/week: 0.0 oz    Comment: none in 10 years  . Drug use: No  . Sexual activity: No  Other Topics Concern  . None  Social History Narrative   She is from  [place]. She has always lived in [place]. She has traveled to [place], [place], [place], [place], & [place]. 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 Prior exposure to parakeets with last exposure remote in her current home. Previously had mold in her home that was in her bathroom & fixed. Prior exposure to hot tubs but none recently. Pt lives at home with Destiny Hughes, husband.  Has 2 childrean, 12th grade education.      Additional Social History:   Allergies:   Allergies  Allergen Reactions  . Albuterol Other (See Comments)    Had difficulty breathing after a nebulizer treatment  . Penicillins Anaphylaxis and Swelling    Has patient had a PCN reaction causing immediate rash, facial/tongue/throat swelling, SOB or lightheadedness with hypotension: Yes Has patient had a PCN reaction causing severe rash involving mucus membranes or skin necrosis: Rash Has patient had a PCN reaction that required hospitalization: No Has patient had a PCN reaction occurring within the last 10 years: No If all of the above answers are "NO", then may proceed with Cephalosporin use.  [name] [Kdc:Albuterol] Shortness Of Breath  . Sulfa Antibiotics Swelling    Throat swells, but no shortness of breath noted  . Zanaflex [Tizanidine] Other (See  Comments)    Possible confusion (??)  . Codeine Nausea And Vomiting  . Ecotrin [Aspirin] Nausea And Vomiting    Metabolic Disorder Labs: Lab Results  Component Value Date   HGBA1C 6.4 (H) 04/28/2015   MPG 137 (H) 04/28/2015   MPG 134 (H) 05/21/2014   No results found for: PROLACTIN Lab Results  Component Value Date   CHOL 195 04/28/2015   TRIG 188 (H) 04/28/2015   HDL 43 (L) 04/28/2015   CHOLHDL 4.5 04/28/2015   VLDL 38 (H) 04/28/2015   LDLCALC 114 04/28/2015   LDLCALC 126 (H) 05/21/2014     Current Medications: Current Outpatient Medications  Medication Sig Dispense Refill  . amLODipine (NORVASC) 5 MG tablet Take 5 mg by mouth daily.  0  . apixaban (ELIQUIS) 5 MG TABS tablet Take 1 tablet (5 mg total) by mouth 2 (two) times daily. 60 tablet 6  . benztropine (COGENTIN) 0.5 MG tablet Take 0.5 mg by mouth 2 (two) times daily.  0  . donepezil (ARICEPT) 5 MG tablet Take 1 tablet (5 mg total) by mouth at bedtime. 30 tablet 0  . gabapentin (NEURONTIN) 100 MG capsule Take 1 capsule (100 mg total) by mouth 2 (two) times daily. 60 capsule 0  . hydrocortisone (ANUSOL-HC) 25 MG suppository Place 1 suppository (25 mg total) rectally 2 (two) times daily. For 7 days 14 suppository 0  . ipratropium (ATROVENT) 0.02 % nebulizer solution Take by nebulization 2 (two) times daily.  0  . levalbuterol (XOPENEX HFA) 45 MCG/ACT inhaler Inhale 2 puffs into the lungs every 4 (four) hours as needed for wheezing. 1 Inhaler 12  . levothyroxine (SYNTHROID, LEVOTHROID) 125 MCG tablet Take 125 mcg by mouth daily before breakfast.    . lisinopril (PRINIVIL,ZESTRIL) 10 MG tablet Take 20 mg by mouth daily.  0  . lisinopril (PRINIVIL,ZESTRIL) 5 MG tablet Take 1 tablet (5 mg total) by mouth daily. (Patient not taking: Reported on 11/20/2016) 30 tablet 11  . magnesium oxide (MAG-OX) 400 MG tablet Take 1 tablet (400 mg total) by mouth daily. (Patient not taking: Reported on 11/20/2016) 30 tablet 3  .  Melatonin 3 MG  TABS Take 3 mg by mouth at bedtime.    . memantine (NAMENDA) 5 MG tablet Take 5 mg by mouth daily.  0  . nicotine (NICODERM CQ - DOSED IN MG/24 HOURS) 14 mg/24hr patch Place 14 mg onto the skin daily.    Marland Kitchen OLANZapine (ZYPREXA) 5 MG tablet Take 1 tablet (5 mg total) by mouth at bedtime. 14 tablet 0  . pantoprazole (PROTONIX) 40 MG tablet Take 1 tablet (40 mg total) by mouth 2 (two) times daily. 60 tablet 0  . propranolol (INDERAL) 80 MG tablet Take 40 mg by mouth 3 (three) times daily.    . risperiDONE (RISPERDAL) 1 MG tablet Take 1 mg by mouth 3 (three) times daily.  0  . sodium chloride 1 g tablet Take 1 g by mouth 3 (three) times daily.    . traZODone (DESYREL) 150 MG tablet Take 300 mg by mouth at bedtime as needed. for sleep  0   No current facility-administered medications for this visit.     Neurologic: Headache: No Seizure: No Paresthesias:No  Musculoskeletal: Strength & Muscle Tone: decreased Gait & Station: unsteady Patient leans: N/A  Psychiatric Specialty Exam: ROS  Blood pressure 126/70, pulse 60, height 5' 4.75" (1.645 m), weight 152 lb (68.9 kg), SpO2 95 %.Body mass index is 25.49 kg/m.  General Appearance: Casual  Eye Contact:  Good  Speech:  Clear and Coherent  Volume:  Normal  Mood:  NA  Affect:  Blunt  Thought Process:  Goal Directed  Orientation:  Full (Time, Place, and Person)  Thought Content:  WDL  Suicidal Thoughts:  No  Homicidal Thoughts:  No  Memory:  Immediate;   Poor  Judgement:  Fair  Insight:  Lacking  Psychomotor Activity:  Normal  Concentration:    Recall:  Poor  Fund of Knowledge:Poor  Language: Fair  Akathisia:  No  Handed:  Right  AIMS (if indicated):   Assets:  Communication Skills  ADL's:  Impaired  Cognition: Impaired,  Moderate  Sleep:      Treatment Plan Summary:  t this timeI believe this patient has a memory impairment and a recent episode of delirium. I think it explains her psychotic symptoms and her stated being  very agitated. At this time we'll slowly adjusted down her medications. It is noted she is on 2 antipsychotic medicines but Risperdal and Zyprexa. Because she's not sleeping well at night sleeping perhaps too much during the day will change her Risperdal taking 1 mg 3 times a day to taking 1 mg 2 at night. She'll take none during the day. Also reduce her Zyprexa from 5 mg down to 2.5 mg at night. The patient will continue taking Aricept 5 mg but in the next month or 2 will consider increasing that. We'll also relook at her Namenda and consider adjusting that as well. Noted is the patient is no longer on trazodone. At this time the patient is safe for she is living. Her husband is there all the time and a responsible son Destiny Hughes sees them every day. The patient is not making it tends to elope. She's not violent or aggressive. She certainly not suicidal. Overall she's doing quite well. Essentially she is recovering from a delirious state where she should multiple psychiatric symptomatology.She is on multiple medications. Patient coming up soon as their husbands to the operating room will be in a rehabilitation center. At that time Destiny Hughes her son will take over his care her care. For  now the patient return to see me in 6 weeks where that time consider further reductions in her neuroleptic medicine.   Jerral Ralph, MD 11/28/20184:13 PM

## 2017-02-16 DIAGNOSIS — M545 Low back pain: Secondary | ICD-10-CM | POA: Diagnosis not present

## 2017-02-16 DIAGNOSIS — I480 Paroxysmal atrial fibrillation: Secondary | ICD-10-CM | POA: Diagnosis not present

## 2017-02-16 DIAGNOSIS — F0391 Unspecified dementia with behavioral disturbance: Secondary | ICD-10-CM | POA: Diagnosis not present

## 2017-02-16 DIAGNOSIS — F17210 Nicotine dependence, cigarettes, uncomplicated: Secondary | ICD-10-CM | POA: Diagnosis not present

## 2017-02-16 DIAGNOSIS — J449 Chronic obstructive pulmonary disease, unspecified: Secondary | ICD-10-CM | POA: Diagnosis not present

## 2017-02-16 DIAGNOSIS — I10 Essential (primary) hypertension: Secondary | ICD-10-CM | POA: Diagnosis not present

## 2017-02-23 ENCOUNTER — Other Ambulatory Visit (HOSPITAL_COMMUNITY): Payer: Self-pay | Admitting: Psychiatry

## 2017-03-02 DIAGNOSIS — I480 Paroxysmal atrial fibrillation: Secondary | ICD-10-CM | POA: Diagnosis not present

## 2017-03-02 DIAGNOSIS — M545 Low back pain: Secondary | ICD-10-CM | POA: Diagnosis not present

## 2017-03-02 DIAGNOSIS — I10 Essential (primary) hypertension: Secondary | ICD-10-CM | POA: Diagnosis not present

## 2017-03-02 DIAGNOSIS — F0391 Unspecified dementia with behavioral disturbance: Secondary | ICD-10-CM | POA: Diagnosis not present

## 2017-03-02 DIAGNOSIS — F17210 Nicotine dependence, cigarettes, uncomplicated: Secondary | ICD-10-CM | POA: Diagnosis not present

## 2017-03-02 DIAGNOSIS — J449 Chronic obstructive pulmonary disease, unspecified: Secondary | ICD-10-CM | POA: Diagnosis not present

## 2017-03-21 HISTORY — PX: EYE SURGERY: SHX253

## 2017-03-29 ENCOUNTER — Other Ambulatory Visit: Payer: Self-pay | Admitting: Cardiovascular Disease

## 2017-03-29 NOTE — Telephone Encounter (Signed)
Please review for refill, Thanks !  

## 2017-04-07 ENCOUNTER — Encounter (HOSPITAL_COMMUNITY): Payer: Self-pay | Admitting: Psychiatry

## 2017-04-07 ENCOUNTER — Ambulatory Visit (INDEPENDENT_AMBULATORY_CARE_PROVIDER_SITE_OTHER): Payer: Medicare Other | Admitting: Psychiatry

## 2017-04-07 VITALS — BP 132/78 | HR 68 | Ht 66.0 in | Wt 135.0 lb

## 2017-04-07 DIAGNOSIS — Z736 Limitation of activities due to disability: Secondary | ICD-10-CM

## 2017-04-07 DIAGNOSIS — F05 Delirium due to known physiological condition: Secondary | ICD-10-CM

## 2017-04-07 DIAGNOSIS — Z87891 Personal history of nicotine dependence: Secondary | ICD-10-CM

## 2017-04-07 DIAGNOSIS — Z810 Family history of intellectual disabilities: Secondary | ICD-10-CM | POA: Diagnosis not present

## 2017-04-07 DIAGNOSIS — F0391 Unspecified dementia with behavioral disturbance: Secondary | ICD-10-CM | POA: Diagnosis not present

## 2017-04-07 NOTE — Progress Notes (Signed)
Psychiatric Initial Adult Assessment   Patient Identification: Destiny Hughes MRN:  [id number] Date of Evaluation:  04/07/2017 Referral Source: [facility] Chief Complaint:    At this time the patient is doing very well. She seen with her husband [name] and her son [name]. The patient has done well coming off Zyprexa. This patient was hospitalized at [facility] this year. She never been in a psychiatric hospital. Reason for hospitalization was because she had electrolyte imbalance probably related to dehydration. As a result she became delirious became agitated and psychotic. The patient stated the hospital for a few weeks it was treated with multiple psychotropic medications. This included Zyprexa 5 mg and 3 mg of Risperdal. Importantly she was also started on some medicines are memory cleaning Namenda and Aricept. After her first visit we slowly discontinued her Zyprexa and reduce her Risperdal from taking 3 mg a day to taking just 2 mg at night. The family says she's doing much better. She is more alert more with it and with me she is much more animated and engaging. Their biggest complaint and really there only complaint is that she continues to have a memory problem. I suspect this is likely a functional still recovering from delirium but I do suspect that she has an underlying dementing process. I cannot be sure this and I will not do a dementia evaluation in the next 6 months. For today we'll go ahead and start reducing her Risperdal further. The patient denies being depressed or anxious. She is sleeping and eating well. She has no anxiety. She is functioning much better.Her husband [name] is very supportive.  Visit Diagnosis:  Associated Signs/Symptoms: Depression Symptoms:  fatigue, (Hypo) Manic Symptoms:   Anxiety Symptoms:   Psychotic Symptoms:   PTSD Symptoms:   Past Psychiatric History: none  Previous Psychotropic Medications: Yes    Substance Abuse History in the last 12 months:  No.  Consequences of Substance Abuse:   Past Medical History:  Past Medical History:  Diagnosis Date  . Chronic back pain   . Cognitive retention disorder   . Collapse of right lung   . COPD (chronic obstructive pulmonary disease) (HCC)    Hx. Bronchitis occ, 01-25-11 somes issue now taking  Z-pak-started 01-24-11/Scar tissue  present  from previous lung collapse  . Dementia with psychosis   . Depression   . Depression   . GERD (gastroesophageal reflux disease)    tx. TUMS  . Glaucoma   . Hyperlipemia   . Hypothyroidism    tx. Levothyroxine  . Major depression with psychotic features ([place])   . Nephrolithiasis   . Neuromuscular disorder ([place]) 01-25-11   Pain/nerve stimulator implanted -2 yrs ago.  Marland Kitchen Reflex, gag, absent   . Reflex, gag, absent     Past Surgical History:  Procedure Laterality Date  . A-FLUTTER ABLATION N/A 08/16/2016   Procedure: A-Flutter Ablation;  Surgeon: Evans Lance, MD;  Location: [facility] [place] [facility];  Service: Cardiovascular;  Laterality: N/A;  . APPENDECTOMY  01-25-11  . CARPAL TUNNEL RELEASE  01-25-11   Bil.  . CERVICAL SPINE SURGERY  01-25-11   2005-multiple levels  . CHOLECYSTECTOMY  01-25-11   '07  . COLONOSCOPY WITH PROPOFOL N/A 07/23/2012   Procedure: COLONOSCOPY WITH PROPOFOL;  Surgeon: Garlan Fair, MD;  Location: [facility];  Service: Endoscopy;  Laterality: N/A;  . EYE SURGERY    . JOINT REPLACEMENT  01-25-11   6'03 LTHA-hemi  . KNEE ARTHROPLASTY  01-25-11   '04-right, revised x2  . no gag reflex     Pt does not havea gag reflex following  siurgery to neck. Family unsure of date. Pt takes pill in apple sauce.  . SPINAL CORD STIMULATOR IMPLANT  2009 APPROX   DR. ELSNER  . THORACIC SPINE SURGERY  01-25-11   6'02- then nerve stimulator implanted after  . TOTAL KNEE REVISION  02/01/2011   Procedure: TOTAL KNEE REVISION;  Surgeon: Mauri Pole;  Location: [facility];  Service: Orthopedics;   Laterality: Right;  . TUBAL LIGATION      Family Psychiatric History:   Family History:  Family History  Problem Relation Age of Onset  . Heart attack Father   . Emphysema Father   . Aneurysm Mother        CEREBRAL  . Emphysema Mother   . Dementia Mother   . Diabetes Son   . Hypertension Son   . Hypertension Son   . Cancer Sister        Widely Metastatic  . Asthma Son        x2  . Breast cancer Paternal Aunt   . Rheum arthritis Maternal Aunt     Social History:   Social History   Socioeconomic History  . Marital status: Married    Spouse name: None  . Number of children: 2  . Years of education: None  . Highest education level: None  Social Needs  . Financial resource strain: None  . Food insecurity - worry: None  . Food insecurity - inability: None  . Transportation needs - medical: None  . Transportation needs - non-medical: None  Occupational History  . Occupation: disabled  Tobacco Use  . Smoking status: Former Smoker    Packs/day: 0.75    Years: 50.00    Pack years: 37.50    Types: Cigarettes    Start date: 09/13/1961    Last attempt to quit: 05/2016    Years since quitting: 0.8  . Smokeless tobacco: Never Used  . Tobacco comment: Decreased Down to 2/3 a Day if Thinking Abouit It  Substance and Sexual Activity  . Alcohol use: No    Alcohol/week: 0.0 oz    Comment: none in 10 years  . Drug use: No  . Sexual activity: No  Other Topics Concern  . None  Social History Narrative   She is from [facility]. She has always lived in [place]. She has traveled to [place], [place], [place], [place], & [place]. 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 Prior exposure to parakeets with last exposure remote in her current home. Previously had mold in her home that was in her bathroom & fixed. Prior exposure to hot tubs but none recently. Pt lives at home with [name], husband.  Has 2  childrean, 12th grade education.      Additional Social History:   Allergies:   Allergies  Allergen Reactions  . Albuterol Other (See Comments)    Had difficulty breathing after a nebulizer treatment  . Penicillins Anaphylaxis and Swelling    Has patient had a PCN reaction causing immediate rash, facial/tongue/throat swelling, SOB or lightheadedness with hypotension: Yes Has patient had a PCN reaction causing severe rash involving mucus membranes or skin necrosis: Rash Has patient had a PCN reaction that required hospitalization: No Has patient had a PCN reaction occurring within the last  10 years: No If all of the above answers are "NO", then may proceed with Cephalosporin use.  [name] [Kdc:Albuterol] Shortness Of Breath  . Sulfa Antibiotics Swelling    Throat swells, but no shortness of breath noted  . Zanaflex [Tizanidine] Other (See Comments)    Possible confusion (??)  . Codeine Nausea And Vomiting  . Ecotrin [Aspirin] Nausea And Vomiting    Metabolic Disorder Labs: Lab Results  Component Value Date   HGBA1C 6.4 (H) 04/28/2015   MPG 137 (H) 04/28/2015   MPG 134 (H) 05/21/2014   No results found for: PROLACTIN Lab Results  Component Value Date   CHOL 195 04/28/2015   TRIG 188 (H) 04/28/2015   HDL 43 (L) 04/28/2015   CHOLHDL 4.5 04/28/2015   VLDL 38 (H) 04/28/2015   LDLCALC 114 04/28/2015   LDLCALC 126 (H) 05/21/2014     Current Medications: Current Outpatient Medications  Medication Sig Dispense Refill  . amLODipine (NORVASC) 5 MG tablet Take 5 mg by mouth daily.  0  . apixaban (ELIQUIS) 5 MG TABS tablet Take 1 tablet (5 mg total) by mouth 2 (two) times daily. 60 tablet 6  . benztropine (COGENTIN) 0.5 MG tablet TAKE ONE TABLET TWICE DAILY 60 tablet 3  . donepezil (ARICEPT) 5 MG tablet Take 1 tablet (5 mg total) by mouth at bedtime. 30 tablet 0  . gabapentin (NEURONTIN) 100 MG capsule Take 1 capsule (100 mg total) by mouth 2 (two) times daily. 60 capsule 0   . hydrocortisone (ANUSOL-HC) 25 MG suppository Place 1 suppository (25 mg total) rectally 2 (two) times daily. For 7 days 14 suppository 0  . ipratropium (ATROVENT) 0.02 % nebulizer solution Take by nebulization 2 (two) times daily.  0  . levalbuterol (XOPENEX HFA) 45 MCG/ACT inhaler Inhale 2 puffs into the lungs every 4 (four) hours as needed for wheezing. 1 Inhaler 12  . levothyroxine (SYNTHROID, LEVOTHROID) 125 MCG tablet Take 125 mcg by mouth daily before breakfast.    . lisinopril (PRINIVIL,ZESTRIL) 10 MG tablet Take 20 mg by mouth daily.  0  . lisinopril (PRINIVIL,ZESTRIL) 5 MG tablet Take 1 tablet (5 mg total) by mouth daily. 30 tablet 11  . magnesium oxide (MAG-OX) 400 MG tablet Take 1 tablet (400 mg total) by mouth daily. 30 tablet 3  . Melatonin 3 MG TABS Take 3 mg by mouth at bedtime.    . memantine (NAMENDA) 5 MG tablet Take 5 mg by mouth daily.  0  . nicotine (NICODERM CQ - DOSED IN MG/24 HOURS) 14 mg/24hr patch Place 14 mg onto the skin daily.    Marland Kitchen OLANZapine (ZYPREXA) 5 MG tablet Take 1 tablet (5 mg total) by mouth at bedtime. 14 tablet 0  . pantoprazole (PROTONIX) 40 MG tablet Take 1 tablet (40 mg total) by mouth 2 (two) times daily. 60 tablet 0  . propranolol (INDERAL) 80 MG tablet Take 40 mg by mouth 3 (three) times daily.    . risperiDONE (RISPERDAL) 1 MG tablet Take 1 mg by mouth 3 (three) times daily.  0  . sodium chloride 1 g tablet Take 1 g by mouth 3 (three) times daily.    . traZODone (DESYREL) 150 MG tablet Take 300 mg by mouth at bedtime as needed. for sleep  0   No current facility-administered medications for this visit.     Neurologic: Headache: No Seizure: No Paresthesias:No  Musculoskeletal: Strength & Muscle Tone: decreased Gait & Station: unsteady Patient leans: N/A  Psychiatric Specialty  Exam: ROS  Blood pressure 132/78, pulse 68, height 5\' 6"  (1.676 m), weight 135 lb (61.2 kg).Body mass index is 21.79 kg/m.  General Appearance: Casual  Eye  Contact:  Good  Speech:  Clear and Coherent  Volume:  Normal  Mood:  NA  Affect:  Blunt  Thought Process:  Goal Directed  Orientation:  Full (Time, Place, and Person)  Thought Content:  WDL  Suicidal Thoughts:  No  Homicidal Thoughts:  No  Memory:  Immediate;   Poor  Judgement:  Fair  Insight:  Lacking  Psychomotor Activity:  Normal  Concentration:    Recall:  Poor  Fund of Knowledge:Poor  Language: Fair  Akathisia:  No  Handed:  Right  AIMS (if indicated):   Assets:  Communication Skills  ADL's:  Impaired  Cognition: Impaired,  Moderate  Sleep:      Treatment Plan Summary:  At this time we will reduce her Risperdaldown to taking just 0.5 mg at night. Her next visit we'll likely get rid of it. It should be the patient does have thyroid disease and we'll clarify what her thyroid level is. Her next visit we she'll feel better than memory assessment and consider getting the blood work for dementia and confirming if she's had a CAT scan or not. At the age of 89 she should have a CAT scan for memory evaluation. For now the patient is doing better. She is off Zyprexa and eventually will be ordered in significant dose Risperdal which will likely discontinue on her next visit. Patient shows no evidence of tardive dyskinesia. She denies chest pain or shortness of breath. She is functioning fairly well.this patient she'll return to see me in 3 months. She is not suicidal nor she homicidal. She is calm and compliant

## 2017-04-13 ENCOUNTER — Encounter: Payer: Self-pay | Admitting: Family Medicine

## 2017-04-13 ENCOUNTER — Ambulatory Visit (INDEPENDENT_AMBULATORY_CARE_PROVIDER_SITE_OTHER): Payer: Medicare Other | Admitting: Family Medicine

## 2017-04-13 VITALS — BP 130/76 | HR 63 | Temp 99.3°F | Ht 65.5 in | Wt 158.1 lb

## 2017-04-13 DIAGNOSIS — Z23 Encounter for immunization: Secondary | ICD-10-CM | POA: Diagnosis not present

## 2017-04-13 DIAGNOSIS — I10 Essential (primary) hypertension: Secondary | ICD-10-CM | POA: Diagnosis not present

## 2017-04-13 DIAGNOSIS — F0392 Unspecified dementia, unspecified severity, with psychotic disturbance: Secondary | ICD-10-CM

## 2017-04-13 DIAGNOSIS — F0391 Unspecified dementia with behavioral disturbance: Secondary | ICD-10-CM

## 2017-04-13 DIAGNOSIS — Z7689 Persons encountering health services in other specified circumstances: Secondary | ICD-10-CM

## 2017-04-13 NOTE — Patient Instructions (Addendum)
Dementia Caregiver Guide Dementia is a term used to describe a number of symptoms that affect memory and thinking. The most common symptoms include:  Memory loss.  Trouble with language and communication.  Trouble concentrating.  Poor judgment.  Problems with reasoning.  Child-like behavior and language.  Extreme anxiety.  Angry outbursts.  Wandering from home or public places.  Dementia usually gets worse slowly over time. In the early stages, people with dementia can stay independent and safe with some help. In later stages, they need help with daily tasks such as dressing, grooming, and using the bathroom. How to help the person with dementia cope Dementia can be frightening and confusing. Here are some tips to help the person with dementia cope with changes caused by the disease. General tips  Keep the person on track with his or her routine.  Try to identify areas where the person may need help.  Be supportive, patient, calm, and encouraging.  Gently remind the person that adjusting to changes takes time.  Help with the tasks that the person has asked for help with.  Keep the person involved in daily tasks and decisions as much as possible.  Encourage conversation, but try not to get frustrated or harried if the person struggles to find words or does not seem to appreciate your help. Communication tips  When the person is talking or seems frustrated, make eye contact and hold the person's hand.  Ask specific questions that need yes or no answers.  Use simple words, short sentences, and a calm voice. Only give one direction at a time.  When offering choices, limit them to just 1 or 2.  Avoid correcting the person in a negative way.  If the person is struggling to find the right words, gently try to help him or her. How to recognize symptoms of stress Symptoms of stress in caregivers include:  Feeling frustrated or angry with the person with  dementia.  Denying that the person has dementia or that his or her symptoms will not improve.  Feeling hopeless and unappreciated.  Difficulty sleeping.  Difficulty concentrating.  Feeling anxious, irritable, or depressed.  Developing stress-related health problems.  Feeling like you have too little time for your own life.  Follow these instructions at home:  Make sure that you and the person you are caring for: ? Get regular sleep. ? Exercise regularly. ? Eat regular, nutritious meals. ? Drink enough fluid to keep your urine clear or pale yellow. ? Take over-the-counter and prescription medicines only as told by your health care providers. ? Attend all scheduled health care appointments.  Join a support group with others who are caregivers.  Ask about respite care resources so that you can have a regular break from the stress of caregiving.  Look for signs of stress in yourself and in the person you are caring for. If you notice signs of stress, take steps to manage it.  Consider any safety risks and take steps to avoid them.  Organize medications in a pill box for each day of the week.  Create a plan to handle any legal or financial matters. Get legal or financial advice if needed.  Keep a calendar in a central location to remind the person of appointments or other activities. Tips for reducing the risk of injury  Keep floors clear of clutter. Remove rugs, magazine racks, and floor lamps.  Keep hallways well lit, especially at night.  Put a handrail and nonslip mat in the bathtub   or shower.  Put childproof locks on cabinets that contain dangerous items, such as medicines, alcohol, guns, toxic cleaning items, sharp tools or utensils, matches, and lighters.  Put the locks in places where the person cannot see or reach them easily. This will help ensure that the person does not wander out of the house and get lost.  Be prepared for emergencies. Keep a list of  emergency phone numbers and addresses in a convenient area.  Remove car keys and lock garage doors so that the person does not try to get in the car and drive.  Have the person wear a bracelet that tracks locations and identifies the person as having memory problems. This should be worn at all times for safety. Where to find support: Many individuals and organizations offer support. These include:  Support groups for people with dementia and for caregivers.  Counselors or therapists.  Home health care services.  Adult day care centers.  Where to find more information: Alzheimer's Association: www.alz.org Contact a health care provider if:  The person's health is rapidly getting worse.  You are no longer able to care for the person.  Caring for the person is affecting your physical and emotional health.  The person threatens himself or herself, you, or anyone else. Summary  Dementia is a term used to describe a number of symptoms that affect memory and thinking.  Dementia usually gets worse slowly over time.  Take steps to reduce the person's risk of injury, and to plan for future care.  Caregivers need support, relief from caregiving, and time for their own lives. This information is not intended to replace advice given to you by your health care provider. Make sure you discuss any questions you have with your health care provider. Document Released: 02/09/2016 Document Revised: 02/09/2016 Document Reviewed: 02/09/2016 Elsevier Interactive Patient Education  2018 [employer]  

## 2017-04-13 NOTE — Progress Notes (Signed)
Patient presents to clinic today to establish care. Patient is accompanied by her son and husband.  SUBJECTIVE: PMH: Patient is a 72 yo female with pmh sig for dementia, hypertension, COPD, hypothyroidism, diastolic heart failure, depression.  Patient was formally seen in [place] by Darcey Nora.  Family cannot recall the name of office.  Patient is also followed by psychiatry and cardiology.  Patient's husband is interested in the patient having a flu shot today.  Per son, patient has chronic rhinorrhea status post tear duct surgery years ago.  Cardiac conditions: -Patient is currently followed by Dr. Lovena Le, cardiologist. -They have an appointment next week -Patient has a history of diastolic heart failure, A. fib, hypertension. -Patient is currently taking propranolol, lisinopril, Norvasc. -Not currently checking BP at home. -Per family members patient has been stable.  Dementia with psychosis: -Patient is followed by Dr. Letta Moynahan, psychiatry -Patient was just seen yesterday where medication adjustments were made. -Per family Risperdal was discontinued. -Patient is currently taking olanzapine, melatonin. -Unclear if patient is still taking donepezil as per husband patient no longer wanted to take this medication.  Allergies: Albuterol Penicillin Ventolin-shortness of breath Sulfa-edema Zanaflex Codeine-nausea vomiting Ecotrin-nausea vomiting  Past surgical history: Tonsillectomy Appendectomy  Social history: Patient is married.  She is currently retired.  She is a former smoker.  Patient states she quit 1 year ago.  Patient is not currently using alcohol or drugs.  Past Medical History:  Diagnosis Date  . Chronic back pain   . Cognitive retention disorder   . Collapse of right lung   . COPD (chronic obstructive pulmonary disease) (HCC)    Hx. Bronchitis occ, 01-25-11 somes issue now taking  Z-pak-started 01-24-11/Scar tissue  present  from previous lung collapse    . Dementia with psychosis   . Depression   . Depression   . GERD (gastroesophageal reflux disease)    tx. TUMS  . Glaucoma   . Hyperlipemia   . Hypothyroidism    tx. Levothyroxine  . Major depression with psychotic features ([place])   . Nephrolithiasis   . Neuromuscular disorder ([place]) 01-25-11   Pain/nerve stimulator implanted -2 yrs ago.  Marland Kitchen Reflex, gag, absent   . Reflex, gag, absent     Past Surgical History:  Procedure Laterality Date  . A-FLUTTER ABLATION N/A 08/16/2016   Procedure: A-Flutter Ablation;  Surgeon: Evans Lance, MD;  Location: [facility];  Service: Cardiovascular;  Laterality: N/A;  . APPENDECTOMY  01-25-11  . CARPAL TUNNEL RELEASE  01-25-11   Bil.  . CERVICAL SPINE SURGERY  01-25-11   2005-multiple levels  . CHOLECYSTECTOMY  01-25-11   '07  . COLONOSCOPY WITH PROPOFOL N/A 07/23/2012   Procedure: COLONOSCOPY WITH PROPOFOL;  Surgeon: Garlan Fair, MD;  Location: [facility];  Service: Endoscopy;  Laterality: N/A;  . EYE SURGERY    . JOINT REPLACEMENT  01-25-11   6'03 LTHA-hemi  . KNEE ARTHROPLASTY  01-25-11   '04-right, revised x2  . no gag reflex     Pt does not havea gag reflex following  siurgery to neck. Family unsure of date. Pt takes pill in apple sauce.  . SPINAL CORD STIMULATOR IMPLANT  2009 APPROX   DR. ELSNER  . THORACIC SPINE SURGERY  01-25-11   6'02- then nerve stimulator implanted after  . TOTAL KNEE REVISION  02/01/2011   Procedure: TOTAL KNEE REVISION;  Surgeon: Mauri Pole;  Location: [facility];  Service: Orthopedics;  Laterality: Right;  . TUBAL  LIGATION      Current Outpatient Medications on File Prior to Visit  Medication Sig Dispense Refill  . amLODipine (NORVASC) 5 MG tablet Take 5 mg by mouth daily.  0  . apixaban (ELIQUIS) 5 MG TABS tablet Take 1 tablet (5 mg total) by mouth 2 (two) times daily. 60 tablet 6  . benztropine (COGENTIN) 0.5 MG tablet TAKE ONE TABLET TWICE DAILY 60 tablet 3  . donepezil (ARICEPT) 5 MG tablet  Take 1 tablet (5 mg total) by mouth at bedtime. 30 tablet 0  . gabapentin (NEURONTIN) 100 MG capsule Take 1 capsule (100 mg total) by mouth 2 (two) times daily. 60 capsule 0  . levothyroxine (SYNTHROID, LEVOTHROID) 125 MCG tablet Take 125 mcg by mouth daily before breakfast.    . lisinopril (PRINIVIL,ZESTRIL) 10 MG tablet Take 20 mg by mouth daily.  0  . lisinopril (PRINIVIL,ZESTRIL) 5 MG tablet Take 1 tablet (5 mg total) by mouth daily. 30 tablet 11  . magnesium oxide (MAG-OX) 400 MG tablet Take 1 tablet (400 mg total) by mouth daily. 30 tablet 3  . Melatonin 3 MG TABS Take 3 mg by mouth at bedtime.    . memantine (NAMENDA) 5 MG tablet Take 5 mg by mouth daily.  0  . nicotine (NICODERM CQ - DOSED IN MG/24 HOURS) 14 mg/24hr patch Place 14 mg onto the skin daily.    Marland Kitchen OLANZapine (ZYPREXA) 5 MG tablet Take 1 tablet (5 mg total) by mouth at bedtime. 14 tablet 0  . pantoprazole (PROTONIX) 40 MG tablet Take 1 tablet (40 mg total) by mouth 2 (two) times daily. 60 tablet 0  . propranolol (INDERAL) 80 MG tablet Take 40 mg by mouth 3 (three) times daily.    . risperiDONE (RISPERDAL) 1 MG tablet Take 1 mg by mouth 3 (three) times daily.  0  . sodium chloride 1 g tablet Take 1 g by mouth 3 (three) times daily.    . traZODone (DESYREL) 150 MG tablet Take 300 mg by mouth at bedtime as needed. for sleep  0  . hydrocortisone (ANUSOL-HC) 25 MG suppository Place 1 suppository (25 mg total) rectally 2 (two) times daily. For 7 days (Patient not taking: Reported on 04/13/2017) 14 suppository 0  . ipratropium (ATROVENT) 0.02 % nebulizer solution Take by nebulization 2 (two) times daily.  0  . levalbuterol (XOPENEX HFA) 45 MCG/ACT inhaler Inhale 2 puffs into the lungs every 4 (four) hours as needed for wheezing. (Patient not taking: Reported on 04/13/2017) 1 Inhaler 12  . [DISCONTINUED] ferrous sulfate 325 (65 FE) MG tablet Take 1 tablet (325 mg total) by mouth 3 (three) times daily after meals.    . [DISCONTINUED]  rivaroxaban (XARELTO) 10 MG TABS tablet Take 1 tablet (10 mg total) by mouth daily. 12 tablet    No current facility-administered medications on file prior to visit.     Allergies  Allergen Reactions  . Albuterol Other (See Comments)    Had difficulty breathing after a nebulizer treatment  . Penicillins Anaphylaxis and Swelling    Has patient had a PCN reaction causing immediate rash, facial/tongue/throat swelling, SOB or lightheadedness with hypotension: Yes Has patient had a PCN reaction causing severe rash involving mucus membranes or skin necrosis: Rash Has patient had a PCN reaction that required hospitalization: No Has patient had a PCN reaction occurring within the last 10 years: No If all of the above answers are "NO", then may proceed with Cephalosporin use.  [name] [Kdc:Albuterol] Shortness  Of Breath  . Sulfa Antibiotics Swelling    Throat swells, but no shortness of breath noted  . Zanaflex [Tizanidine] Other (See Comments)    Possible confusion (??)  . Codeine Nausea And Vomiting  . Ecotrin [Aspirin] Nausea And Vomiting    Family History  Problem Relation Age of Onset  . Heart attack Father   . Emphysema Father   . Aneurysm Mother        CEREBRAL  . Emphysema Mother   . Dementia Mother   . Diabetes Son   . Hypertension Son   . Hypertension Son   . Cancer Sister        Widely Metastatic  . Asthma Son        x2  . Breast cancer Paternal Aunt   . Rheum arthritis Maternal Aunt     Social History   Socioeconomic History  . Marital status: Married    Spouse name: Not on file  . Number of children: 2  . Years of education: Not on file  . Highest education level: Not on file  Social Needs  . Financial resource strain: Not on file  . Food insecurity - worry: Not on file  . Food insecurity - inability: Not on file  . Transportation needs - medical: Not on file  . Transportation needs - non-medical: Not on file  Occupational History  . Occupation:  disabled  Tobacco Use  . Smoking status: Former Smoker    Packs/day: 0.75    Years: 50.00    Pack years: 37.50    Types: Cigarettes    Start date: 09/13/1961    Last attempt to quit: 05/2016    Years since quitting: 0.9  . Smokeless tobacco: Never Used  . Tobacco comment: Decreased Down to 2/3 a Day if Thinking Abouit It  Substance and Sexual Activity  . Alcohol use: No    Alcohol/week: 0.0 oz    Comment: none in 10 years  . Drug use: No  . Sexual activity: No  Other Topics Concern  . Not on file  Social History Narrative   She is from [facility]. She has always lived in [place]. She has traveled to [place], [place], [place], [place], & [place]. 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 Prior exposure to parakeets with last exposure remote in her current home. Previously had mold in her home that was in her bathroom & fixed. Prior exposure to hot tubs but none recently. Pt lives at home with [name], husband.  Has 2 childrean, 12th grade education.      ROS General: Denies fever, chills, night sweats, changes in weight, changes in appetite HEENT: Denies headaches, ear pain, changes in vision, rhinorrhea, sore throat CV: Denies CP, palpitations, SOB, orthopnea Pulm: Denies SOB, cough, wheezing GI: Denies abdominal pain, nausea, vomiting, diarrhea, constipation GU: Denies dysuria, hematuria, frequency, vaginal discharge Msk: Denies muscle cramps, joint pains Neuro: Denies weakness, numbness, tingling Skin: Denies rashes, bruising Psych: Denies depression, anxiety, hallucinations  BP 130/76 (BP Location: Right Arm, Patient Position: Sitting, Cuff Size: Large)   Pulse 63   Temp 99.3 F (37.4 C) (Oral)   Ht 5' 5.5" (1.664 m)   Wt 158 lb 1.6 oz (71.7 kg)   BMI 25.91 kg/m   Physical Exam Gen. Pleasant, well developed, well-nourished, in NAD HEENT - [place]/AT, PERRL, EOMI, conjunctive clear, no  scleral icterus, no  nasal drainage, pharynx without erythema or exudate.  TMs normal bilaterally.  No cervical lymphadenopathy. Lungs: no use of accessory muscles, CTAB, no wheezes, rales or rhonchi Cardiovascular: RRR, No r/g/m, no peripheral edema Abdomen: BS present, soft, nontender,nondistended Musculoskeletal: No deformities, moves all four extremities, no cyanosis or clubbing, normal tone Neuro: Alert, CN II-XII intact, normal gait Skin:  Warm, dry, intact, no lesions Psych: normal affect, mood appropriate  No results found for this or any previous visit (from the past 2160 hour(s)).  Assessment/Plan: Essential hypertension -Continue taking Norvasc, lisinopril, propranolol daily -Encouraged to keep follow-up with cardiologist Dr. Lovena Le next week.  Dementia with psychosis -Continue following psychiatrist Dr. Letta Moynahan  Encounter to establish care -We reviewed the PMH, PSH, FH, SH, Meds and Allergies. -We provided refills for any medications we will prescribe as needed. -We addressed current concerns per orders and patient instructions. -We have asked for records for pertinent exams, studies, vaccines and notes from previous providers. -We have advised patient to follow up per instructions below.  Need for immunization against influenza  - Plan: CANCELED: Flu Vaccine QUAD 36+ mos IM   F/u prn  Grier Mitts, MD

## 2017-04-17 ENCOUNTER — Other Ambulatory Visit: Payer: Self-pay | Admitting: Emergency Medicine

## 2017-04-17 MED ORDER — LEVOTHYROXINE SODIUM 125 MCG PO TABS: 125.0000 ug | ORAL_TABLET | Freq: Every day | ORAL | 0 refills | Status: DC

## 2017-04-18 ENCOUNTER — Encounter: Payer: Self-pay | Admitting: Internal Medicine

## 2017-04-18 ENCOUNTER — Ambulatory Visit (INDEPENDENT_AMBULATORY_CARE_PROVIDER_SITE_OTHER): Payer: Medicare Other | Admitting: Internal Medicine

## 2017-04-18 VITALS — BP 128/72 | HR 61 | Ht 65.5 in | Wt 161.0 lb

## 2017-04-18 DIAGNOSIS — I483 Typical atrial flutter: Secondary | ICD-10-CM | POA: Diagnosis not present

## 2017-04-18 DIAGNOSIS — R002 Palpitations: Secondary | ICD-10-CM | POA: Diagnosis not present

## 2017-04-18 NOTE — Patient Instructions (Addendum)
Medication Instructions:  Your physician has recommended you make the following change in your medication:  1.   Stop taking your Eliquis.  Labwork: None ordered.  Testing/Procedures: None ordered.  Follow-Up: Your physician wants you to follow-up in: 6 months with Dr. Lovena Le.    You will receive a reminder letter in the mail two months in advance. If you don't receive a letter, please call our office to schedule the follow-up appointment.   Any Other Special Instructions Will Be Listed Below (If Applicable).   If you need a refill on your cardiac medications before your next appointment, please call your pharmacy.

## 2017-04-18 NOTE — Progress Notes (Signed)
HPI Destiny Hughes returns today for ongoing evaluation and management. She has a h/o SVT and atrial flutter and is s/p catheter ablation of both over a year ago. In the interim, she has done well with no chest pain or sob. No palpitations. No syncope.  Allergies  Allergen Reactions  . Albuterol Other (See Comments) and Shortness Of Breath    Had difficulty breathing after a nebulizer treatment  . Penicillins Anaphylaxis and Swelling    Has patient had a PCN reaction causing immediate rash, facial/tongue/throat swelling, SOB or lightheadedness with hypotension: Yes Has patient had a PCN reaction causing severe rash involving mucus membranes or skin necrosis: Rash Has patient had a PCN reaction that required hospitalization: No Has patient had a PCN reaction occurring within the last 10 years: No If all of the above answers are "NO", then may proceed with Cephalosporin use.  Destiny Hughes [Kdc:Albuterol] Shortness Of Breath  . Sulfa Antibiotics Swelling    Throat swells, but no shortness of breath noted  . Zanaflex [Tizanidine] Other (See Comments)    Possible confusion (??)  . Codeine Nausea And Vomiting  . Ecotrin [Aspirin] Nausea And Vomiting     Current Outpatient Medications  Medication Sig Dispense Refill  . amLODipine (NORVASC) 5 MG tablet Take 5 mg by mouth daily.  0  . apixaban (ELIQUIS) 5 MG TABS tablet Take 1 tablet (5 mg total) by mouth 2 (two) times daily. 60 tablet 6  . benztropine (COGENTIN) 0.5 MG tablet TAKE ONE TABLET TWICE DAILY 60 tablet 3  . donepezil (ARICEPT) 5 MG tablet Take 1 tablet (5 mg total) by mouth at bedtime. 30 tablet 0  . gabapentin (NEURONTIN) 100 MG capsule Take 1 capsule (100 mg total) by mouth 2 (two) times daily. 60 capsule 0  . hydrocortisone (ANUSOL-HC) 25 MG suppository Place 1 suppository (25 mg total) rectally 2 (two) times daily. For 7 days 14 suppository 0  . ipratropium (ATROVENT) 0.02 % nebulizer solution Take by nebulization 2  (two) times daily.  0  . levalbuterol (XOPENEX HFA) 45 MCG/ACT inhaler Inhale 2 puffs into the lungs every 4 (four) hours as needed for wheezing. 1 Inhaler 12  . levothyroxine (SYNTHROID, LEVOTHROID) 125 MCG tablet Take 1 tablet (125 mcg total) by mouth daily before breakfast. 90 tablet 0  . lisinopril (PRINIVIL,ZESTRIL) 10 MG tablet Take 20 mg by mouth daily.  0  . lisinopril (PRINIVIL,ZESTRIL) 5 MG tablet Take 1 tablet (5 mg total) by mouth daily. 30 tablet 11  . magnesium oxide (MAG-OX) 400 MG tablet Take 1 tablet (400 mg total) by mouth daily. 30 tablet 3  . Melatonin 3 MG TABS Take 3 mg by mouth at bedtime.    . memantine (NAMENDA) 5 MG tablet Take 5 mg by mouth daily.  0  . nicotine (NICODERM CQ - DOSED IN MG/24 HOURS) 14 mg/24hr patch Place 14 mg onto the skin daily.    Marland Kitchen OLANZapine (ZYPREXA) 5 MG tablet Take 1 tablet (5 mg total) by mouth at bedtime. 14 tablet 0  . pantoprazole (PROTONIX) 40 MG tablet Take 1 tablet (40 mg total) by mouth 2 (two) times daily. 60 tablet 0  . propranolol (INDERAL) 80 MG tablet Take 40 mg by mouth 3 (three) times daily.    . risperiDONE (RISPERDAL) 1 MG tablet Take 1 mg by mouth 3 (three) times daily.  0  . sodium chloride 1 g tablet Take 1 g by mouth 3 (three) times daily.    Marland Kitchen  traZODone (DESYREL) 150 MG tablet Take 300 mg by mouth at bedtime as needed. for sleep  0   No current facility-administered medications for this visit.      Past Medical History:  Diagnosis Date  . Chronic back pain   . Cognitive retention disorder   . Collapse of right lung   . COPD (chronic obstructive pulmonary disease) (HCC)    Hx. Bronchitis occ, 01-25-11 somes issue now taking  Z-pak-started 01-24-11/Scar tissue  present  from previous lung collapse  . Dementia with psychosis   . Depression   . Depression   . GERD (gastroesophageal reflux disease)    tx. TUMS  . Glaucoma   . Hyperlipemia   . Hypothyroidism    tx. Levothyroxine  . Major depression with psychotic  features ([place])   . Nephrolithiasis   . Neuromuscular disorder ([place]) 01-25-11   Pain/nerve stimulator implanted -2 yrs ago.  Marland Kitchen Reflex, gag, absent   . Reflex, gag, absent     ROS:   All systems reviewed and negative except as noted in the HPI.   Past Surgical History:  Procedure Laterality Date  . A-FLUTTER ABLATION N/A 08/16/2016   Procedure: A-Flutter Ablation;  Surgeon: Evans Lance, MD;  Location: [facility];  Service: Cardiovascular;  Laterality: N/A;  . APPENDECTOMY  01-25-11  . CARPAL TUNNEL RELEASE  01-25-11   Bil.  . CERVICAL SPINE SURGERY  01-25-11   2005-multiple levels  . CHOLECYSTECTOMY  01-25-11   '07  . COLONOSCOPY WITH PROPOFOL N/A 07/23/2012   Procedure: COLONOSCOPY WITH PROPOFOL;  Surgeon: Garlan Fair, MD;  Location: [facility];  Service: Endoscopy;  Laterality: N/A;  . EYE SURGERY    . JOINT REPLACEMENT  01-25-11   6'03 LTHA-hemi  . KNEE ARTHROPLASTY  01-25-11   '04-right, revised x2  . no gag reflex     Pt does not havea gag reflex following  siurgery to neck. Family unsure of date. Pt takes pill in apple sauce.  . SPINAL CORD STIMULATOR IMPLANT  2009 APPROX   DR. ELSNER  . THORACIC SPINE SURGERY  01-25-11   6'02- then nerve stimulator implanted after  . TOTAL KNEE REVISION  02/01/2011   Procedure: TOTAL KNEE REVISION;  Surgeon: Mauri Pole;  Location: [facility];  Service: Orthopedics;  Laterality: Right;  . TUBAL LIGATION       Family History  Problem Relation Age of Onset  . Heart attack Father   . Emphysema Father   . Aneurysm Mother        CEREBRAL  . Emphysema Mother   . Dementia Mother   . Diabetes Son   . Hypertension Son   . Hypertension Son   . Cancer Sister        Widely Metastatic  . Asthma Son        x2  . Breast cancer Paternal Aunt   . Rheum arthritis Maternal Aunt      Social History   Socioeconomic History  . Marital status: Married    Spouse name: Not on file  . Number of children: 2  . Years of education:  Not on file  . Highest education level: Not on file  Social Needs  . Financial resource strain: Not on file  . Food insecurity - worry: Not on file  . Food insecurity - inability: Not on file  . Transportation needs - medical: Not on file  . Transportation needs - non-medical: Not on file  Occupational History  .  Occupation: disabled  Tobacco Use  . Smoking status: Former Smoker    Packs/day: 0.75    Years: 50.00    Pack years: 37.50    Types: Cigarettes    Start date: 09/13/1961    Last attempt to quit: 05/2016    Years since quitting: 0.9  . Smokeless tobacco: Never Used  . Tobacco comment: Decreased Down to 2/3 a Day if Thinking Abouit It  Substance and Sexual Activity  . Alcohol use: No    Alcohol/week: 0.0 oz    Comment: none in 10 years  . Drug use: No  . Sexual activity: No  Other Topics Concern  . Not on file  Social History Narrative   She is from [facility]. She has always lived in [place]. She has traveled to [place], [place], [place], [place], & [place]. 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 Prior exposure to parakeets with last exposure remote in her current home. Previously had mold in her home that was in her bathroom & fixed. Prior exposure to hot tubs but none recently. Pt lives at home with [name], husband.  Has 2 childrean, 12th grade education.       BP 128/72   Pulse 61   Ht 5' 5.5" (1.664 m)   Wt 161 lb (73 kg)   BMI 26.38 kg/m   Physical Exam:  Well appearing 72 yo woman, NAD HEENT: Unremarkable Neck:  No JVD, no thyromegally Lymphatics:  No adenopathy Back:  No CVA tenderness Lungs:  Clear with no wheezes HEART:  Regular rate rhythm, no murmurs, no rubs, no clicks Abd:  soft, positive bowel sounds, no organomegally, no rebound, no guarding Ext:  2 plus pulses, no edema, no cyanosis, no clubbing Skin:  No rashes no nodules Neuro:  CN II through XII  intact, motor grossly intact  EKG - NSR with PAC's  DEVICE  Normal device function.  See PaceArt for details.   Assess/Plan: 1. SVT/Atrial flutter - she has had no recurrent arrhythmias. She will stop her Eliquis as she has had no recurrent arrhythmias since her ablation. 2. Memory loss - she has very little short term memory. Her husband makes sure she takes her medication.  3. Rectal bleeding -none recently. I have reiveiwed her systemic anti-coag and at this time, the risk of anti-coag is greater than the clotting risk and she will hold her anti-coagulation.

## 2017-04-25 ENCOUNTER — Encounter: Payer: Self-pay | Admitting: Family Medicine

## 2017-04-28 ENCOUNTER — Telehealth: Payer: Self-pay

## 2017-04-28 ENCOUNTER — Ambulatory Visit: Payer: Self-pay | Admitting: Family Medicine

## 2017-04-28 ENCOUNTER — Ambulatory Visit: Payer: Medicare Other

## 2017-04-28 VITALS — BP 98/58 | HR 62 | Temp 98.4°F

## 2017-04-28 DIAGNOSIS — R6889 Other general symptoms and signs: Secondary | ICD-10-CM

## 2017-04-28 NOTE — Progress Notes (Signed)
Pt was scheduled for Vital signs check Bp taken 98/58, Pulse 62, Temp 98.4, and Oxygen 95%. Pt husband stated that pt needed to see Dr Cain Saupe for a cough and sore throat but patient was scheduled for Saturday clinic at 11am due to no openings with any provider at the practice.Pt was Advised to go to Emergency room if worse but husband declined stated that he will bring wife to the clinic on Saturday.

## 2017-04-28 NOTE — Telephone Encounter (Signed)
A visit note has been routed to the provider for cosigning

## 2017-04-29 ENCOUNTER — Encounter: Payer: Self-pay | Admitting: Internal Medicine

## 2017-04-29 ENCOUNTER — Ambulatory Visit (INDEPENDENT_AMBULATORY_CARE_PROVIDER_SITE_OTHER): Payer: Medicare Other | Admitting: Internal Medicine

## 2017-04-29 VITALS — BP 118/62 | HR 61 | Temp 98.8°F | Resp 16 | Wt 154.0 lb

## 2017-04-29 DIAGNOSIS — J029 Acute pharyngitis, unspecified: Secondary | ICD-10-CM | POA: Diagnosis not present

## 2017-04-29 DIAGNOSIS — H6123 Impacted cerumen, bilateral: Secondary | ICD-10-CM

## 2017-04-29 DIAGNOSIS — H6983 Other specified disorders of Eustachian tube, bilateral: Secondary | ICD-10-CM

## 2017-04-29 DIAGNOSIS — J441 Chronic obstructive pulmonary disease with (acute) exacerbation: Secondary | ICD-10-CM

## 2017-04-29 LAB — POC INFLUENZA A&B (BINAX/QUICKVUE)
INFLUENZA A, POC: NEGATIVE
INFLUENZA B, POC: NEGATIVE

## 2017-04-29 MED ORDER — PREDNISONE 20 MG PO TABS
40.0000 mg | ORAL_TABLET | Freq: Every day | ORAL | 0 refills | Status: DC
Start: 2017-04-29 — End: 2017-09-10

## 2017-04-29 MED ORDER — FLUTICASONE PROPIONATE 50 MCG/ACT NA SUSP: NASAL | 0 refills | Status: DC

## 2017-04-29 MED ORDER — TIOTROPIUM BROMIDE MONOHYDRATE 2.5 MCG/ACT IN AERS: INHALATION_SPRAY | RESPIRATORY_TRACT | 0 refills | Status: DC

## 2017-04-29 MED ORDER — SALINE SPRAY 0.65 % NA SOLN: 1.0000 | NASAL | 0 refills | Status: DC | PRN

## 2017-04-29 MED ORDER — FLUTICASONE PROPIONATE (INHAL) 250 MCG/BLIST IN AEPB: INHALATION_SPRAY | RESPIRATORY_TRACT | 0 refills | Status: DC

## 2017-04-29 MED ORDER — LEVOFLOXACIN 750 MG PO TABS: 750.0000 mg | ORAL_TABLET | Freq: Every day | ORAL | 0 refills | Status: DC

## 2017-04-29 NOTE — Progress Notes (Signed)
Chief Complaint  Patient presents with  . Follow-up   Sick visit with husband  She c/o cough, wheezing, sore throat, nasal drainage+clear , h/a, left ear pain, dry cough x 1 week. Unknown if has fever, no chills. NO meds tried. She is a former smoker with COPD who is not on inhalers allergic to albuterol been given samples of Breo, Lafata, Daliresp by Dr. Ashok Hughes and has not seen him in a while currently not uses any maintenance inhalers and did not like powder consistency of BReo.  She was a smoker who quit 1 year ago. Per pulm notes she has severe COPD   Review of Systems  Constitutional: Negative for fever.  HENT: Positive for ear pain and sore throat. Negative for ear discharge and hearing loss.   Eyes: Negative for blurred vision.  Respiratory: Positive for cough. Negative for sputum production.   Cardiovascular: Negative for chest pain.  Skin: Negative for rash.  Neurological: Positive for headaches.   Past Medical History:  Diagnosis Date  . Chronic back pain   . Cognitive retention disorder   . Collapse of right lung   . COPD (chronic obstructive pulmonary disease) (HCC)    Hx. Bronchitis occ, 01-25-11 somes issue now taking  Z-pak-started 01-24-11/Scar tissue  present  from previous lung collapse  . Dementia with psychosis   . Depression   . Depression   . GERD (gastroesophageal reflux disease)    tx. TUMS  . Glaucoma   . Hyperlipemia   . Hypothyroidism    tx. Levothyroxine  . Major depression with psychotic features ([place])   . Nephrolithiasis   . Neuromuscular disorder ([place]) 01-25-11   Pain/nerve stimulator implanted -2 yrs ago.  Marland Kitchen Reflex, gag, absent   . Reflex, gag, absent    Past Surgical History:  Procedure Laterality Date  . A-FLUTTER ABLATION N/A 08/16/2016   Procedure: A-Flutter Ablation;  Surgeon: Destiny Lance, MD;  Location: [facility];  Service: Cardiovascular;  Laterality: N/A;  . APPENDECTOMY  01-25-11  . CARPAL TUNNEL RELEASE  01-25-11   Bil.   . CERVICAL SPINE SURGERY  01-25-11   2005-multiple levels  . CHOLECYSTECTOMY  01-25-11   '07  . COLONOSCOPY WITH PROPOFOL N/A 07/23/2012   Procedure: COLONOSCOPY WITH PROPOFOL;  Surgeon: Destiny Fair, MD;  Location: [facility];  Service: Endoscopy;  Laterality: N/A;  . EYE SURGERY    . JOINT REPLACEMENT  01-25-11   6'03 LTHA-hemi  . KNEE ARTHROPLASTY  01-25-11   '04-right, revised x2  . no gag reflex     Pt does not havea gag reflex following  siurgery to neck. Family unsure of date. Pt takes pill in apple sauce.  . SPINAL CORD STIMULATOR IMPLANT  2009 APPROX   Destiny Hughes  . THORACIC SPINE SURGERY  01-25-11   6'02- then nerve stimulator implanted after  . TOTAL KNEE REVISION  02/01/2011   Procedure: TOTAL KNEE REVISION;  Surgeon: Destiny Hughes;  Location: [facility];  Service: Orthopedics;  Laterality: Right;  . TUBAL LIGATION     Family History  Problem Relation Age of Onset  . Heart attack Father   . Emphysema Father   . Aneurysm Mother        CEREBRAL  . Emphysema Mother   . Dementia Mother   . Diabetes Son   . Hypertension Son   . Hypertension Son   . Cancer Sister        Widely Metastatic  . Asthma Son  x2  . Breast cancer Paternal Aunt   . Rheum arthritis Maternal Aunt    Social History   Socioeconomic History  . Marital status: Married    Spouse name: Not on file  . Number of children: 2  . Years of education: Not on file  . Highest education level: Not on file  Social Needs  . Financial resource strain: Not on file  . Food insecurity - worry: Not on file  . Food insecurity - inability: Not on file  . Transportation needs - medical: Not on file  . Transportation needs - non-medical: Not on file  Occupational History  . Occupation: disabled  Tobacco Use  . Smoking status: Former Smoker    Packs/day: 0.75    Years: 50.00    Pack years: 37.50    Types: Cigarettes    Start date: 09/13/1961    Last attempt to quit: 05/2016    Years since quitting:  0.9  . Smokeless tobacco: Never Used  . Tobacco comment: Decreased Down to 2/3 a Day if Thinking Abouit It  Substance and Sexual Activity  . Alcohol use: No    Alcohol/week: 0.0 oz    Comment: none in 10 years  . Drug use: No  . Sexual activity: No  Other Topics Concern  . Not on file  Social History Narrative   She is from [facility]. She has always lived in [place]. She has traveled to [place], [place], [place], [place], & [place]. 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 Prior exposure to parakeets with last exposure remote in her current home. Previously had mold in her home that was in her bathroom & fixed. Prior exposure to hot tubs but none recently. Pt lives at home with Destiny Hughes, husband.  Has 2 childrean, 12th grade education.     Current Meds  Medication Sig  . amLODipine (NORVASC) 5 MG tablet Take 5 mg by mouth daily.  . benztropine (COGENTIN) 0.5 MG tablet TAKE ONE TABLET TWICE DAILY  . donepezil (ARICEPT) 5 MG tablet Take 1 tablet (5 mg total) by mouth at bedtime.  . ferrous sulfate 325 (65 FE) MG tablet Take 325 mg by mouth 3 (three) times daily after meals.  . gabapentin (NEURONTIN) 100 MG capsule Take 1 capsule (100 mg total) by mouth 2 (two) times daily.  Marland Kitchen ipratropium (ATROVENT) 0.02 % nebulizer solution Take by nebulization 2 (two) times daily.  Marland Kitchen levothyroxine (SYNTHROID, LEVOTHROID) 125 MCG tablet Take 1 tablet (125 mcg total) by mouth daily before breakfast.  . lisinopril (PRINIVIL,ZESTRIL) 5 MG tablet Take 1 tablet (5 mg total) by mouth daily.  . magnesium oxide (MAG-OX) 400 MG tablet Take 1 tablet (400 mg total) by mouth daily.  . Melatonin 3 MG TABS Take 3 mg by mouth at bedtime.  . memantine (NAMENDA) 5 MG tablet Take 5 mg by mouth daily.  . pantoprazole (PROTONIX) 40 MG tablet Take 1 tablet (40 mg total) by mouth 2 (two) times daily.  . propranolol (INDERAL) 80 MG tablet Take 40  mg by mouth 3 (three) times daily.  . risperiDONE (RISPERDAL) 1 MG tablet Take 1 mg by mouth 3 (three) times daily.  . sodium chloride 1 g tablet Take 1 g by mouth 3 (three) times daily.   Allergies  Allergen Reactions  . Albuterol Other (See Comments) and Shortness Of Breath    Had difficulty  breathing after a nebulizer treatment  . Penicillins Anaphylaxis and Swelling    Has patient had a PCN reaction causing immediate rash, facial/tongue/throat swelling, SOB or lightheadedness with hypotension: Yes Has patient had a PCN reaction causing severe rash involving mucus membranes or skin necrosis: Rash Has patient had a PCN reaction that required hospitalization: No Has patient had a PCN reaction occurring within the last 10 years: No If all of the above answers are "NO", then may proceed with Cephalosporin use.  [name] [Kdc:Albuterol] Shortness Of Breath  . Sulfa Antibiotics Swelling    Throat swells, but no shortness of breath noted  . Zanaflex [Tizanidine] Other (See Comments)    Possible confusion (??)  . Codeine Nausea And Vomiting  . Ecotrin [Aspirin] Nausea And Vomiting   Recent Results (from the past 2160 hour(s))  POC Influenza A&B (Binax test)     Status: None   Collection Time: 04/29/17 10:56 AM  Result Value Ref Range   Influenza A, POC Negative Negative   Influenza B, POC Negative Negative   Objective  Body mass index is 25.24 kg/m. Wt Readings from Last 3 Encounters:  04/29/17 154 lb (69.9 kg)  04/18/17 161 lb (73 kg)  04/13/17 158 lb 1.6 oz (71.7 kg)   Temp Readings from Last 3 Encounters:  04/29/17 98.8 F (37.1 C) (Oral)  04/28/17 98.4 F (36.9 C) (Oral)  04/13/17 99.3 F (37.4 C) (Oral)   BP Readings from Last 3 Encounters:  04/29/17 118/62  04/28/17 (!) 98/58  04/18/17 128/72   Pulse Readings from Last 3 Encounters:  04/29/17 61  04/28/17 62  04/18/17 61   O2 sat room air 95%  Physical Exam  Constitutional: She is oriented to person, place,  and time and well-developed, well-nourished, and in no distress. Vital signs are normal.  HENT:  Head: Normocephalic and atraumatic.  Mouth/Throat: Oropharynx is clear and moist and mucous membranes are normal.  B/l cerumen impaction   Eyes: Conjunctivae are normal. Pupils are equal, round, and reactive to light.  Cardiovascular: Normal rate, regular rhythm and normal heart sounds.  Pulmonary/Chest: Effort normal. She has wheezes. She has rales.  Neurological: She is alert and oriented to person, place, and time. Gait normal. Gait normal.  Skin: Skin is warm, dry and intact.  Psychiatric: Mood, memory, affect and judgment normal.  Nursing note and vitals reviewed.   Assessment   1. COPD exacerbation with flu negative  2. B/l cerumen impaction and eustachian tube dysfunction   Plan  1. Supportive care given handout Prednisone 40 mg qd x 1 week  Levaquin 750 mg x 1 week  Unable to tolerate albuterol rec use neb machine at home tid prn  Given sample of spiriva 2.5 respimat to use 2 puffs 1x per day  flovent 250 1 puff bid sent to pharmacy  Prev did not like Breo. Was given Lafata and Daliresp by Dr. Ashok Hughes rec use maintenance inhalers qd  F/u with PCP in 1 week if not improving consider pulm referral  2.  RN Cristopher Peru cleaned ears today and clear  NS, Flonase, OTC antihistamine  Provider: Dr. Olivia Mackie McLean-Scocuzza-Internal Medicine

## 2017-04-29 NOTE — Patient Instructions (Addendum)
Please take Levoquin 750 mg daily with food x 1 week  Please take prednisone 40 mg daily x 1 week  Use nebulizer up to 3x per day  Use spiriva  And Flovent   Chronic Obstructive Pulmonary Disease Chronic obstructive pulmonary disease (COPD) is a long-term (chronic) lung problem. When you have COPD, it is hard for air to get in and out of your lungs. The way your lungs work will never return to normal. Usually the condition gets worse over time. There are things you can do to keep yourself as healthy as possible. Your doctor may treat your condition with:  Medicines.  Quitting smoking, if you smoke.  Rehabilitation. This may involve a team of specialists.  Oxygen.  Exercise and changes to your diet.  Lung surgery.  Comfort measures (palliative care).  Follow these instructions at home: Medicines  Take over-the-counter and prescription medicines only as told by your doctor.  Talk to your doctor before taking any cough or allergy medicines. You may need to avoid medicines that cause your lungs to be dry. Lifestyle  If you smoke, stop. Smoking makes the problem worse. If you need help quitting, ask your doctor.  Avoid being around things that make your breathing worse. This may include smoke, chemicals, and fumes.  Stay active, but remember to also rest.  Learn and use tips on how to relax.  Make sure you get enough sleep. Most adults need at least 7 hours a night.  Eat healthy foods. Eat smaller meals more often. Rest before meals. Controlled breathing  Learn and use tips on how to control your breathing as told by your doctor. Try: ? Breathing in (inhaling) through your nose for 1 second. Then, pucker your lips and breath out (exhale) through your lips for 2 seconds. ? Putting one hand on your belly (abdomen). Breathe in slowly through your nose for 1 second. Your hand on your belly should move out. Pucker your lips and breathe out slowly through your lips. Your hand on  your belly should move in as you breathe out. Controlled coughing  Learn and use controlled coughing to clear mucus from your lungs. The steps are: 1. Lean your head a little forward. 2. Breathe in deeply. 3. Try to hold your breath for 3 seconds. 4. Keep your mouth slightly open while coughing 2 times. 5. Spit any mucus out into a tissue. 6. Rest and do the steps again 1 or 2 times as needed. General instructions  Make sure you get all the shots (vaccines) that your doctor recommends. Ask your doctor about a flu shot and a pneumonia shot.  Use oxygen therapy and therapy to help improve your lungs (pulmonary rehabilitation) if told by your doctor. If you need home oxygen therapy, ask your doctor if you should buy a tool to measure your oxygen level (oximeter).  Make a COPD action plan with your doctor. This helps you know what to do if you feel worse than usual.  Manage any other conditions you have as told by your doctor.  Avoid going outside when it is very hot, cold, or humid.  Avoid people who have a sickness you can catch (contagious).  Keep all follow-up visits as told by your doctor. This is important. Contact a doctor if:  You cough up more mucus than usual.  There is a change in the color or thickness of the mucus.  It is harder to breathe than usual.  Your breathing is faster than usual.  You have trouble sleeping.  You need to use your medicines more often than usual.  You have trouble doing your normal activities such as getting dressed or walking around the house. Get help right away if:  You have shortness of breath while resting.  You have shortness of breath that stops you from: ? Being able to talk. ? Doing normal activities.  Your chest hurts for longer than 5 minutes.  Your skin color is more blue than usual.  Your pulse oximeter shows that you have low oxygen for longer than 5 minutes.  You have a fever.  You feel too tired to breathe  normally. Summary  Chronic obstructive pulmonary disease (COPD) is a long-term lung problem.  The way your lungs work will never return to normal. Usually the condition gets worse over time. There are things you can do to keep yourself as healthy as possible.  Take over-the-counter and prescription medicines only as told by your doctor.  If you smoke, stop. Smoking makes the problem worse. This information is not intended to replace advice given to you by your health care provider. Make sure you discuss any questions you have with your health care provider. Document Released: 08/24/2007 Document Revised: 08/13/2015 Document Reviewed: 11/01/2012 Elsevier Interactive Patient Education  2017 [employer].

## 2017-05-05 ENCOUNTER — Encounter: Payer: Self-pay | Admitting: Family Medicine

## 2017-05-05 ENCOUNTER — Ambulatory Visit (INDEPENDENT_AMBULATORY_CARE_PROVIDER_SITE_OTHER): Payer: Medicare Other | Admitting: Family Medicine

## 2017-05-05 ENCOUNTER — Encounter: Payer: Self-pay | Admitting: Emergency Medicine

## 2017-05-05 VITALS — BP 110/70 | HR 70 | Temp 98.3°F | Wt 153.0 lb

## 2017-05-05 DIAGNOSIS — J441 Chronic obstructive pulmonary disease with (acute) exacerbation: Secondary | ICD-10-CM

## 2017-05-05 NOTE — Patient Instructions (Addendum)
Chronic Obstructive Pulmonary Disease Exacerbation Chronic obstructive pulmonary disease (COPD) is a long-term (chronic) condition that affects the lungs. COPD is a general term that can be used to describe many different lung problems that cause lung swelling (inflammation) and limit airflow, including chronic bronchitis and emphysema. COPD exacerbations are episodes when breathing symptoms become much worse and require extra treatment. COPD exacerbations are usually caused by infections. Without treatment, COPD exacerbations can be severe and even life threatening. Frequent COPD exacerbations can cause further damage to the lungs. What are the causes? This condition may be caused by:  Respiratory infections, including viral and bacterial infections.  Exposure to smoke.  Exposure to air pollution, chemical fumes, or dust.  Things that give you an allergic reaction (allergens).  Not taking your usual COPD medicines as directed.  Underlying medical problems, such as congestive heart failure or infections not involving the lungs.  In many cases, the cause (trigger) of this condition is not known. What increases the risk? The following factors may make you more likely to develop this condition:  Smoking cigarettes.  Old age.  Frequent prior COPD exacerbations.  What are the signs or symptoms? Symptoms of this condition include:  Increased coughing.  Increased production of mucus from your lungs (sputum).  Increased wheezing.  Increased shortness of breath.  Rapid or labored breathing.  Chest tightness.  Less energy than usual.  Sleep disruption from symptoms.  Confusion or increased sleepiness.  Often these symptoms happen or get worse even with the use of medicines. How is this diagnosed? This condition is diagnosed based on:  Your medical history.  A physical exam.  You may also have tests, including:  A chest X-ray.  Blood tests.  Lung (pulmonary)  function tests.  How is this treated? Treatment for this condition depends on the severity and cause of the symptoms. You may need to be admitted to a hospital for treatment. Some of the treatments commonly used to treat COPD exacerbations are:  Antibiotic medicines. These may be used for severe exacerbations caused by a lung infection, such as pneumonia.  Bronchodilators. These are inhaled medicines that expand the air passages and allow increased airflow.  Steroid medicines. These act to reduce inflammation in the airways. They may be given with an inhaler, taken by mouth, or given through an IV tube inserted into one of your veins.  Supplemental oxygen therapy.  Airway clearing techniques, such as noninvasive ventilation (NIV) and positive expiratory pressure (PEP). These provide respiratory support through a mask or other noninvasive device. An example of this would be using a continuous positive airway pressure (CPAP) machine to improve delivery of oxygen into your lungs.  Follow these instructions at home: Medicines  Take over-the-counter and prescription medicines only as told by your health care provider. It is important to use correct technique with inhaled medicines.  If you were prescribed an antibiotic medicine or oral steroid, take it as told by your health care provider. Do not stop taking the medicine even if you start to feel better. Lifestyle  Eat a healthy diet.  Exercise regularly.  Get plenty of sleep.  Avoid exposure to all substances that irritate the airway, especially to tobacco smoke.  Wash your hands often with soap and water to reduce the risk of infection. If soap and water are not available, use hand sanitizer.  During flu season, avoid enclosed spaces that are crowded with people. General instructions  Drink enough fluid to keep your urine clear or pale yellow (  unless you have a medical condition that requires fluid restriction).  Use a cool mist  vaporizer. This humidifies the air and makes it easier for you to clear your chest when you cough.  If you have a home nebulizer and oxygen, continue to use them as told by your health care provider.  Keep all follow-up visits as told by your health care provider. This is important. How is this prevented?  Stay up-to-date on pneumococcal and influenza (flu) vaccines. A flu shot is recommended every year to help prevent exacerbations.  Do not use any products that contain nicotine or tobacco, such as cigarettes and e-cigarettes. Quitting smoking is very important in preventing COPD from getting worse and in preventing exacerbations from happening as often. If you need help quitting, ask your health care provider.  Follow all instructions for pulmonary rehabilitation after a recent exacerbation. This can help prevent future exacerbations.  Work with your health care provider to develop and follow an action plan. This tells you what steps to take when you experience certain symptoms. Contact a health care provider if:  You have a worsening of your regular COPD symptoms. Get help right away if:  You have worsening shortness of breath, even when resting.  You have trouble talking.  You have severe chest pain.  You cough up blood.  You have a fever.  You have weakness, vomit repeatedly, or faint.  You feel confused.  You are not able to sleep because of your symptoms.  You have trouble doing daily activities. Summary  COPD exacerbations are episodes when breathing symptoms become much worse and require extra treatment above your normal treatment.  Exacerbations can be severe and even life threatening. Frequent COPD exacerbations can cause further damage to your lungs.  COPD exacerbations are usually triggered by infections such as the flu, colds, and even pneumonia.  Treatment for this condition depends on the severity and cause of the symptoms. You may need to be admitted to a  hospital for treatment.  Quitting smoking is very important to prevent COPD from getting worse and to prevent exacerbations from happening as often. This information is not intended to replace advice given to you by your health care provider. Make sure you discuss any questions you have with your health care provider. Document Released: 01/02/2007 Document Revised: 04/11/2016 Document Reviewed: 04/11/2016 Elsevier Interactive Patient Education  2018 [employer]  

## 2017-05-05 NOTE — Progress Notes (Signed)
Subjective:    Patient ID: Destiny Hughes, female    DOB: Apr 01, 1945, 72 y.o.   MRN: [id number]  No chief complaint on file. Patient is accompanied by her son and her husband.  HPI Patient was seen today for follow-up.  Patient was seen in Saturday clinic for coughing.  Patient was diagnosed with COPD exacerbation.  She was started on prednisone, Levaquin, and given a sample of Spiriva Respimat inhaler.  Per family patient has been taking all of the medications.  Patient is out of Spiriva inhaler as they had difficulty working the inhaler and wasted several sprays of medication.  In the past patient was on Flovent.  Family is unsure if she still has this inhaler.  Patient participates in a program at [facility].  She states she needs a note to return back.  Past Medical History:  Diagnosis Date  . Chronic back pain   . Cognitive retention disorder   . Collapse of right lung   . COPD (chronic obstructive pulmonary disease) (HCC)    Hx. Bronchitis occ, 01-25-11 somes issue now taking  Z-pak-started 01-24-11/Scar tissue  present  from previous lung collapse  . Dementia with psychosis   . Depression   . Depression   . GERD (gastroesophageal reflux disease)    tx. TUMS  . Glaucoma   . Hyperlipemia   . Hypothyroidism    tx. Levothyroxine  . Major depression with psychotic features ([place])   . Nephrolithiasis   . Neuromuscular disorder ([place]) 01-25-11   Pain/nerve stimulator implanted -2 yrs ago.  Marland Kitchen Reflex, gag, absent   . Reflex, gag, absent     Allergies  Allergen Reactions  . Albuterol Other (See Comments) and Shortness Of Breath    Had difficulty breathing after a nebulizer treatment  . Penicillins Anaphylaxis and Swelling    Has patient had a PCN reaction causing immediate rash, facial/tongue/throat swelling, SOB or lightheadedness with hypotension: Yes Has patient had a PCN reaction causing severe rash involving mucus membranes or skin necrosis: Rash Has patient had a PCN  reaction that required hospitalization: No Has patient had a PCN reaction occurring within the last 10 years: No If all of the above answers are "NO", then may proceed with Cephalosporin use.  [name] [Kdc:Albuterol] Shortness Of Breath  . Sulfa Antibiotics Swelling    Throat swells, but no shortness of breath noted  . Zanaflex [Tizanidine] Other (See Comments)    Possible confusion (??)  . Codeine Nausea And Vomiting  . Ecotrin [Aspirin] Nausea And Vomiting    ROS General: Denies fever, chills, night sweats, changes in weight, changes in appetite HEENT: Denies ear pain, changes in vision, rhinorrhea, sore throat  +HAs CV: Denies CP, palpitations, SOB, orthopnea Pulm: Denies SOB, wheezing   +cough GI: Denies abdominal pain, nausea, vomiting, diarrhea, constipation GU: Denies dysuria, hematuria, frequency, vaginal discharge Msk: Denies muscle cramps, joint pains Neuro: Denies weakness, numbness, tingling Skin: Denies rashes, bruising Psych: Denies depression, anxiety, hallucinations     Objective:    Blood pressure 110/70, pulse 70, temperature 98.3 F (36.8 C), temperature source Oral, weight 153 lb (69.4 kg), SpO2 96 %.   Gen. Pleasant, well-nourished, in no distress, normal affect   HEENT: [place]/AT, face symmetric, conjunctiva clear, no scleral icterus, PERRLA, nares patent without drainage, pharynx without erythema or exudate. Neck: No JVD, no thyromegaly, no carotid bruits Lungs: coughing, no accessory muscle use, faint scattered wheezes, no rales Cardiovascular: RRR, no m/r/g, no peripheral edema Abdomen: BS present, soft,  NT/ND Neuro:  A&Ox3, CN II-XII intact, normal gait Skin:  Warm, no lesions/ rash   Wt Readings from Last 3 Encounters:  05/05/17 153 lb (69.4 kg)  04/29/17 154 lb (69.9 kg)  04/18/17 161 lb (73 kg)    Lab Results  Component Value Date   WBC 8.5 11/20/2016   HGB 10.5 (L) 11/20/2016   HCT 31.0 (L) 11/20/2016   PLT 297 11/20/2016   GLUCOSE 126  (H) 11/20/2016   CHOL 195 04/28/2015   TRIG 188 (H) 04/28/2015   HDL 43 (L) 04/28/2015   LDLCALC 114 04/28/2015   ALT 13 (L) 11/15/2016   AST 17 11/15/2016   NA 135 11/20/2016   K 4.0 11/20/2016   CL 96 (L) 11/20/2016   CREATININE 1.00 11/20/2016   BUN 17 11/20/2016   CO2 31 11/15/2016   TSH 0.496 10/03/2016   INR 1.07 08/16/2016   HGBA1C 6.4 (H) 04/28/2015   MICROALBUR 0.4 05/22/2014    Assessment/Plan:  COPD exacerbation (HCC) -Finish course of Levaquin 750 mg and prednisone 40 mg (total course 1 wk) -Okay to start back on Flovent inhaler as inadvertently out of Spiriva. -Continue sodium chloride nasal spray, Flonase -Follow-up PRN     -consider referral to pulm  -given a note stating patient can return to activities at [facility].  Grier Mitts, MD.

## 2017-05-12 ENCOUNTER — Other Ambulatory Visit: Payer: Self-pay | Admitting: Family Medicine

## 2017-05-19 NOTE — Telephone Encounter (Signed)
Maryann pharmacist with [facility] [phone] or doc line [phone]  Calling to f/u on RX meds for pt who is out - pt husband has been by several times and is at the pharmacy now. Pharmacy is open tonight til 6pm  and tomorrow til 3pm. Velta Addison states request was sent electronically 3x and via fax yesterday. Please send asap.

## 2017-05-19 NOTE — Telephone Encounter (Deleted)
Maryann pharmacist with [facility] [phone] or doc line [phone]  Calling to f/u on RX meds for pt who is out - pt husband has been by several times and is at the pharmacy now. Pharmacy is open tonight til 6pm  and tomorrow til 3pm. Velta Addison states request was sent electronically 3x and via fax yesterday. Please send asap.

## 2017-05-22 NOTE — Telephone Encounter (Signed)
Dr. Volanda Napoleon, you have not filled these medications for this patient before. Okay to fill as requested?

## 2017-05-22 NOTE — Telephone Encounter (Signed)
This is the first request I am hearing about.  Unsure if the other refill request were sent to the pt's previous provider.  Ok to refill Namenda and propranolol.

## 2017-05-23 ENCOUNTER — Other Ambulatory Visit: Payer: Self-pay | Admitting: *Deleted

## 2017-05-23 MED ORDER — LISINOPRIL 5 MG PO TABS: 5.0000 mg | ORAL_TABLET | Freq: Every day | ORAL | 5 refills | Status: DC

## 2017-06-02 ENCOUNTER — Other Ambulatory Visit: Payer: Self-pay | Admitting: Family Medicine

## 2017-06-07 ENCOUNTER — Other Ambulatory Visit: Payer: Self-pay | Admitting: Family Medicine

## 2017-06-09 ENCOUNTER — Ambulatory Visit: Payer: Self-pay | Admitting: *Deleted

## 2017-06-09 ENCOUNTER — Encounter (HOSPITAL_COMMUNITY): Payer: Self-pay | Admitting: Psychiatry

## 2017-06-09 ENCOUNTER — Ambulatory Visit (INDEPENDENT_AMBULATORY_CARE_PROVIDER_SITE_OTHER): Payer: Medicare Other | Admitting: Psychiatry

## 2017-06-09 VITALS — BP 132/86 | HR 64 | Ht 65.0 in | Wt 160.0 lb

## 2017-06-09 DIAGNOSIS — Z87891 Personal history of nicotine dependence: Secondary | ICD-10-CM

## 2017-06-09 DIAGNOSIS — Z79899 Other long term (current) drug therapy: Secondary | ICD-10-CM | POA: Diagnosis not present

## 2017-06-09 DIAGNOSIS — F0390 Unspecified dementia without behavioral disturbance: Secondary | ICD-10-CM | POA: Diagnosis not present

## 2017-06-09 IMAGING — RF DG UGI W/ KUB
9 of 10 series · 14 of 16 positions shown · non-contrast
Comparison: Esophagram of 11/17/2014.  CT of 07/01/2016.

CLINICAL DATA: Nausea.  Epigastric pain.  Food intolerance.

EXAM:
UPPER GI SERIES WITH KUB
TECHNIQUE: After obtaining a scout radiograph a routine upper GI series was
performed using thin barium
FLUOROSCOPY TIME:  Fluoroscopy Time:  2 minutes and 18 seconds
Number of Acquired Spot Images: 1

[Series 1: fluoro_barium singleshot_bw · 0.20mm/px · 1 of 1 slices shown]
[im 1/1]
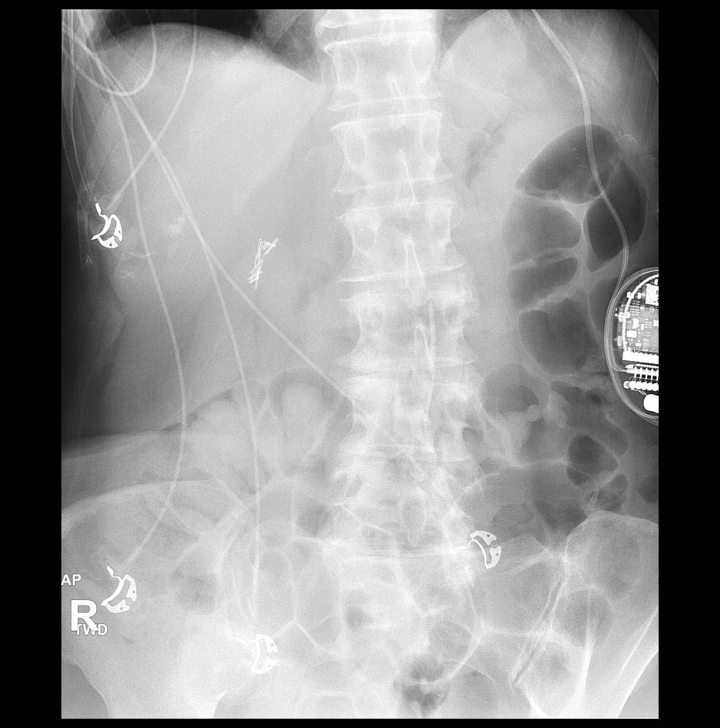

[Series 2: cp_standard · 0.56mm/px · 3 of 87 frames shown (1 of 8)]
[frame 14/87]
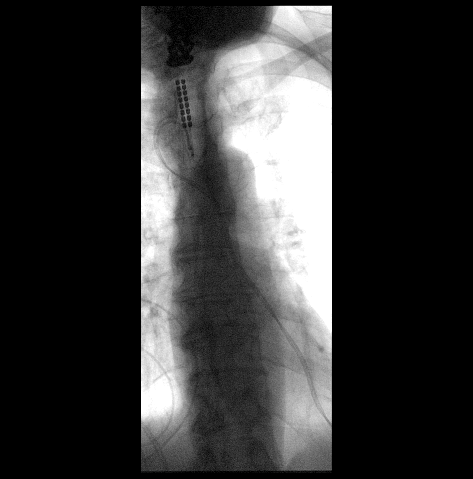
[frame 17/87]
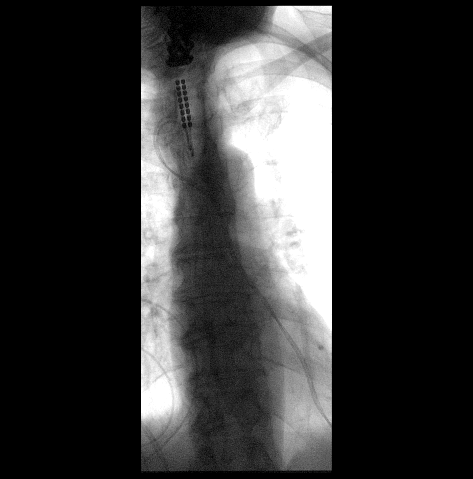
[frame 74/87]
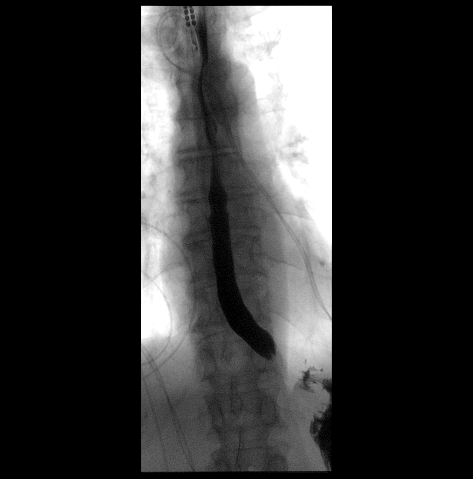

[Series 3: cp_standard · 0.57mm/px · 4 of 140 frames shown (2 of 8)]
[frame 22/140]
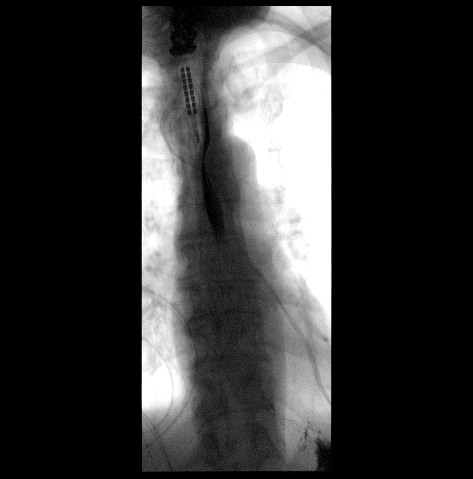
[frame 40/140]
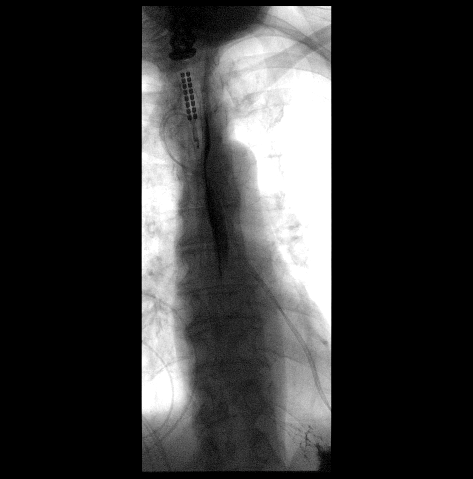
[frame 71/140]
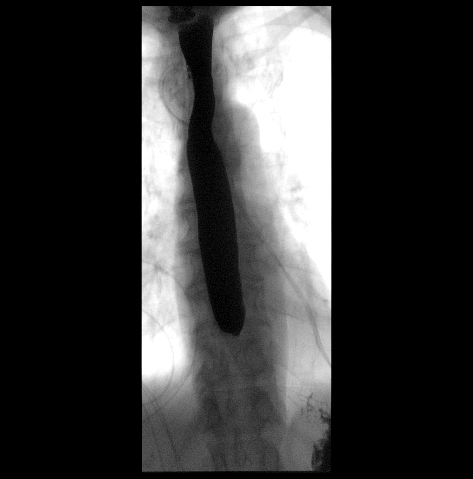
[frame 120/140]
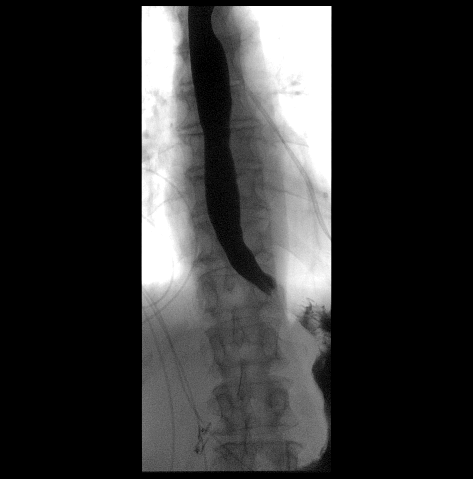

[Series 4: cp_standard · 0.28mm/px · 1 of 1 slices shown (3 of 8)]
[im 1/1]
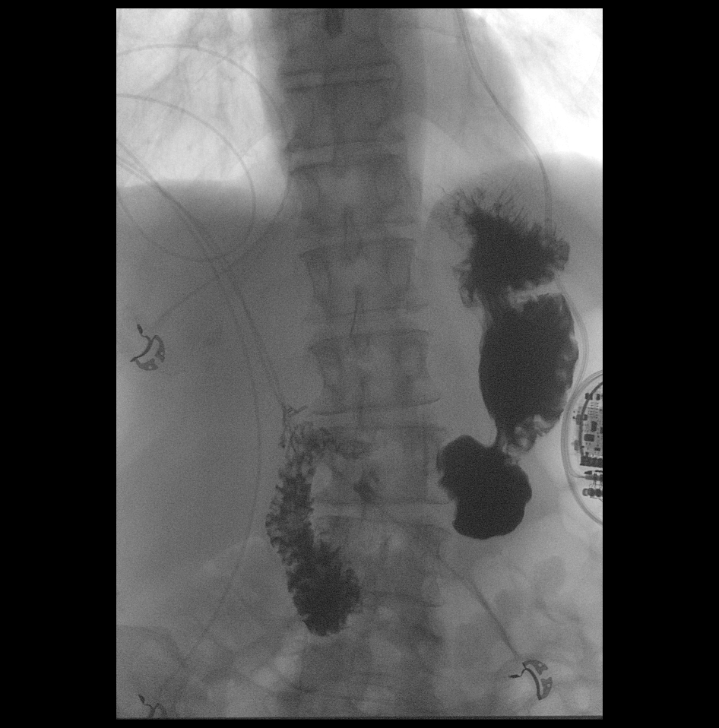

[Series 5: cp_standard · 0.28mm/px · 1 of 1 slices shown (4 of 8)]
[im 1/1]
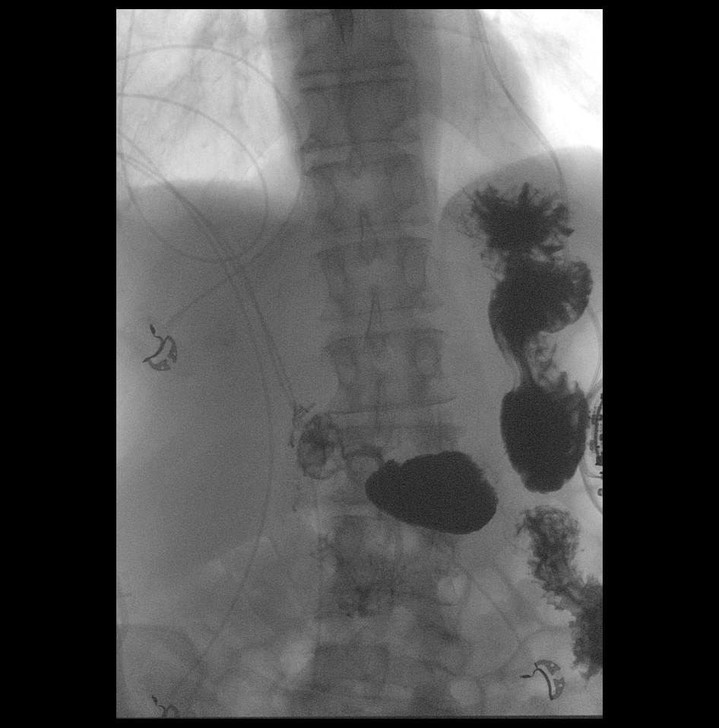

[Series 7: cp_standard · 0.19mm/px · 1 of 1 slices shown (5 of 8)]
[im 1/1]
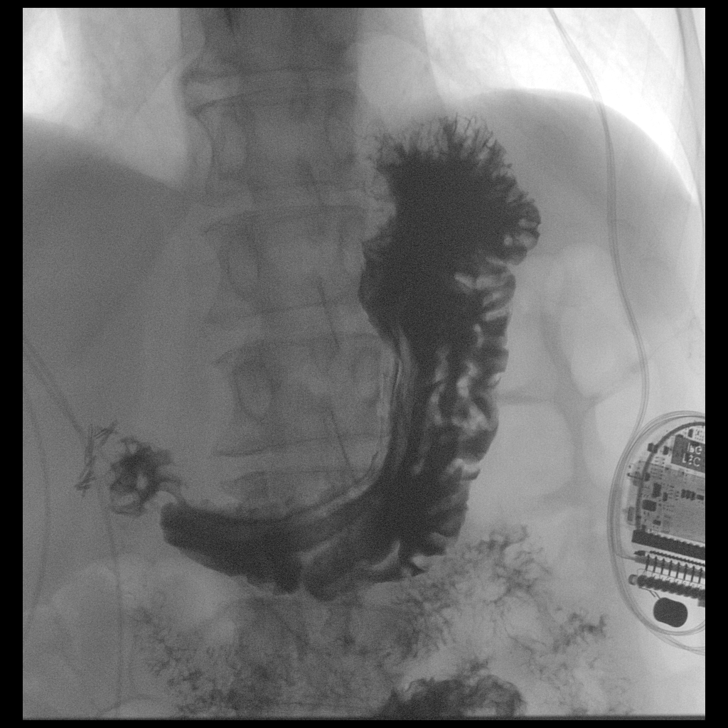

[Series 8: cp_standard · 0.19mm/px · 1 of 1 slices shown (6 of 8)]
[im 1/1]
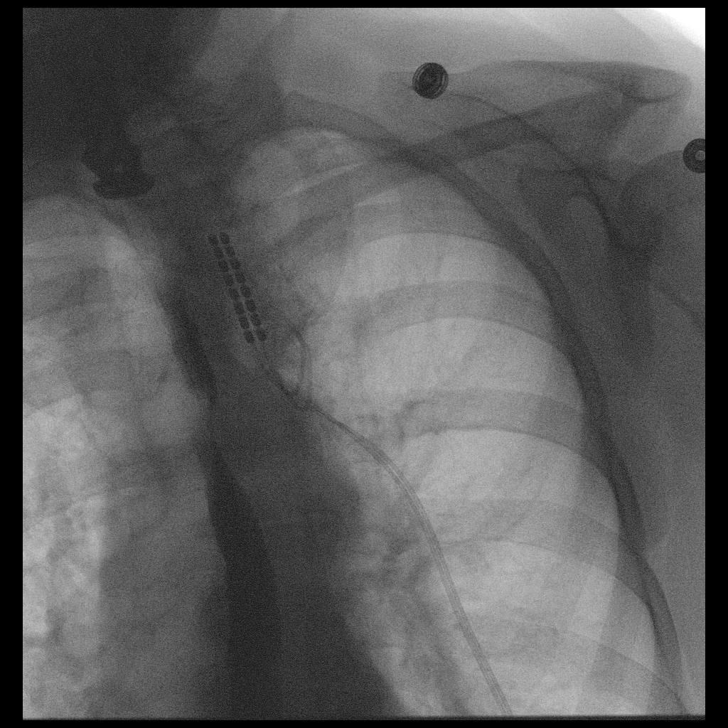

[Series 9: cp_standard · 0.20mm/px · 1 of 1 slices shown (7 of 8)]
[im 1/1]
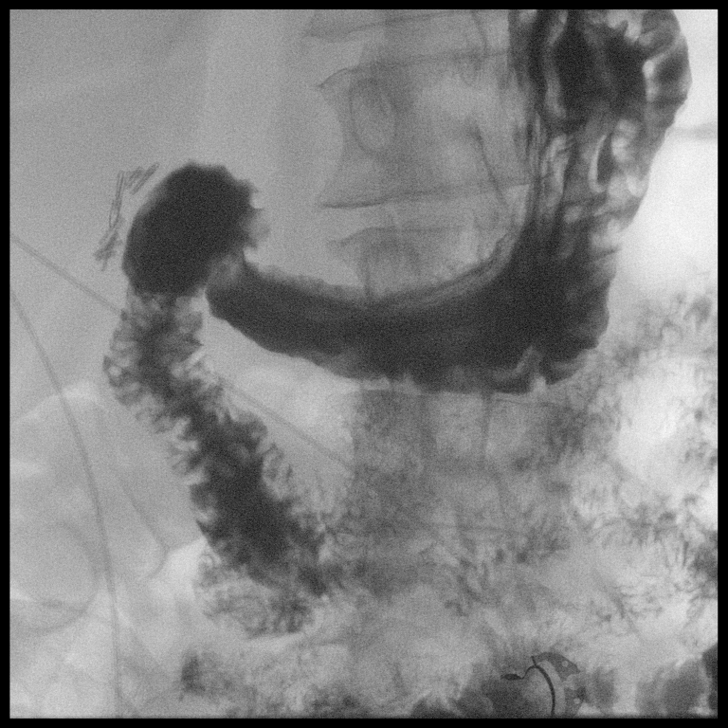

[Series 10: cp_standard · 0.20mm/px · 1 of 1 slices shown (8 of 8)]
[im 1/1]
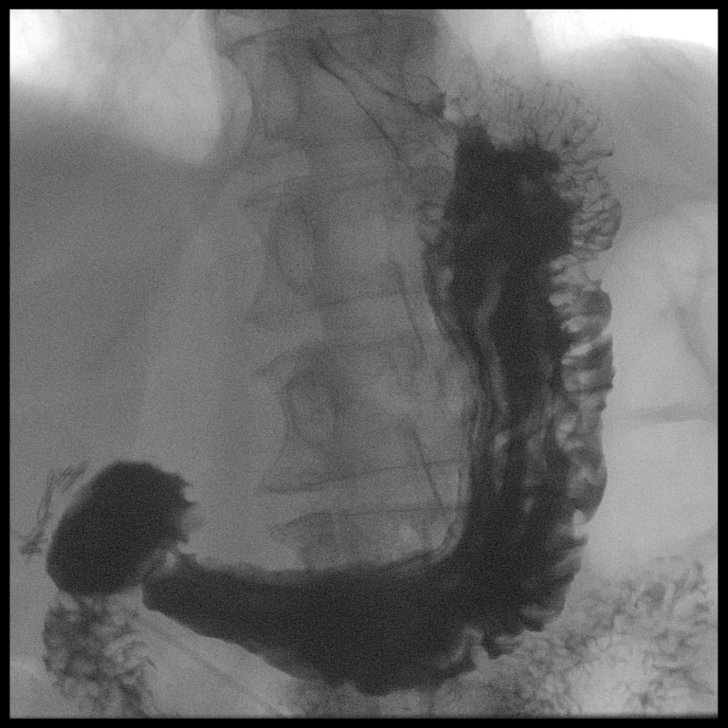

[14 of 16 positions shown; findings below may reference images not displayed]

FINDINGS: Exam is moderately degraded secondary to patient immobility and
clinical status. Exam was performed with patient supine and in a
minimally LPO oblique position.

Preprocedure scout image demonstrates cholecystectomy clips. Mild
convex left lumbar spine curvature. Non-obstructive bowel gas
pattern.

Evaluation of esophageal peristalsis demonstrates an incomplete
primary peristaltic wave with mild proximal escape.

Full column evaluation of the esophagus demonstrates no persistent
narrowing or stricture.

Normal single-contrast appearance of the stomach, without mass or
ulcer identified. Prompt passage of contrast into the duodenal bulb
and C-loop, which are normal in appearance. Normal proximal small
bowel caliber.
IMPRESSION: 1. Limited, single-contrast exam as described above.
2. Normal appearance of the stomach and duodenum, without
explanation for patient's symptoms.
3. Mild esophageal dysmotility, likely presbyesophagus.

## 2017-06-09 NOTE — Telephone Encounter (Signed)
Husband called in (on [place]) questioning what kind of medication OTC he could give his wife, Orabelle for a runny nose.   She has had a clear nasal discharge from her nose for several months.   Denies any other symptoms of URI, not feeling bad.   Only other issue he mentioned was she has dry eyes and uses Refresh eye drops.   I let him know that is fine to use.  She is on many medications so I am going to route this note to Dr. Volanda Napoleon' nurse pool for advise.   I let the husband know someone will contact him with further advice.   He verbalized understanding and was agreeable to this plan. Answer Assessment - Initial Assessment Questions 1. ONSET: "When did the nasal discharge start?"      Had a runny nose for several months.   2. AMOUNT: "How much discharge is there?"      Clear discharge.   Comes and goes about every 15-30 minutes 3. COUGH: "Do you have a cough?" If yes, ask: "Describe the color of your sputum" (clear, white, yellow, green)     She also has dry eyes.  I think she may have a runny nose from the dry eyes.   I'm Refresh is using that.      No cough 4. RESPIRATORY DISTRESS: "Describe your breathing."      No shortness of breath 5. FEVER: "Do you have a fever?" If so, ask: "What is your temperature, how was it measured, and when did it start?"     No fever or sore throat, earache, etc. 6. SEVERITY: "Overall, how bad are you feeling right now?" (e.g., doesn't interfere with normal activities, staying home from school/work, staying in bed)      Her nose gets a little irritated from rubbing it. 7. OTHER SYMPTOMS: "Do you have any other symptoms?" (e.g., sore throat, earache, wheezing, vomiting)     None 8. PREGNANCY: "Is there any chance you are pregnant?" "When was your last menstrual period?"     Not asked  Protocols used: COMMON COLD-A-AH

## 2017-06-09 NOTE — Progress Notes (Signed)
Psychiatric Initial Adult Assessment   Patient Identification: Destiny Hughes MRN:  [id number] Date of Evaluation:  06/09/2017 Referral Source: [facility] Chief Complaint:    Today the patient is seen with her son and her husband [name]. Brita Romp actually doing very well. She does have significant memory problems. Her condition is affected by the fact that she has cataracts and that she's having a hard time seeing somewhat. Overall she is very articulate very clear her thinking. Generally she sleeping and eating very well. She shows no evidence of psychosis she watches television and enjoys the prevention. She is disoriented to the date. She does need assistance dressing every morning by her husband but now she has has no problems going to the bathroom. Her self hygiene is poor. The patient is not aggressive at all. She denies being depressed. She's done very well coming off Zyprexa on a small dose of Risperdal which will stop in 2 weeks. He'll return to see me in 6 weeks and we'll reevaluate the issue of dementia. The family is interested in having a workup for this.  Visit Diagnosis:  Associated Signs/Symptoms: Depression Symptoms:  fatigue, (Hypo) Manic Symptoms:   Anxiety Symptoms:   Psychotic Symptoms:   PTSD Symptoms:   Past Psychiatric History: none  Previous Psychotropic Medications: Yes   Substance Abuse History in the last 12 months:  No.  Consequences of Substance Abuse:   Past Medical History:  Past Medical History:  Diagnosis Date  . Chronic back pain   . Cognitive retention disorder   . Collapse of right lung   . COPD (chronic obstructive pulmonary disease) (HCC)    Hx. Bronchitis occ, 01-25-11 somes issue now taking  Z-pak-started 01-24-11/Scar tissue  present  from previous lung collapse  . Dementia with psychosis   . Depression   . Depression   . GERD (gastroesophageal reflux disease)    tx. TUMS  . Glaucoma   . Hyperlipemia   .  Hypothyroidism    tx. Levothyroxine  . Major depression with psychotic features ([place])   . Nephrolithiasis   . Neuromuscular disorder ([place]) 01-25-11   Pain/nerve stimulator implanted -2 yrs ago.  Marland Kitchen Reflex, gag, absent   . Reflex, gag, absent     Past Surgical History:  Procedure Laterality Date  . A-FLUTTER ABLATION N/A 08/16/2016   Procedure: A-Flutter Ablation;  Surgeon: Evans Lance, MD;  Location: [facility];  Service: Cardiovascular;  Laterality: N/A;  . APPENDECTOMY  01-25-11  . CARPAL TUNNEL RELEASE  01-25-11   [place].  . CERVICAL SPINE SURGERY  01-25-11   2005-multiple levels  . CHOLECYSTECTOMY  01-25-11   '07  . COLONOSCOPY WITH PROPOFOL N/A 07/23/2012   Procedure: COLONOSCOPY WITH PROPOFOL;  Surgeon: Garlan Fair, MD;  Location: [facility];  Service: Endoscopy;  Laterality: N/A;  . EYE SURGERY    . JOINT REPLACEMENT  01-25-11   6'03 LTHA-hemi  . KNEE ARTHROPLASTY  01-25-11   '04-right, revised x2  . no gag reflex     Pt does not havea gag reflex following  siurgery to neck. Family unsure of date. Pt takes pill in apple sauce.  . SPINAL CORD STIMULATOR IMPLANT  2009 APPROX   DR. ELSNER  . THORACIC SPINE SURGERY  01-25-11   6'02- then nerve stimulator implanted after  . TOTAL KNEE REVISION  02/01/2011   Procedure: TOTAL KNEE REVISION;  Surgeon: Mauri Pole;  Location: [facility];  Service: Orthopedics;  Laterality: Right;  .  TUBAL LIGATION      Family Psychiatric History:   Family History:  Family History  Problem Relation Age of Onset  . Heart attack Father   . Emphysema Father   . Aneurysm Mother        CEREBRAL  . Emphysema Mother   . Dementia Mother   . Diabetes Son   . Hypertension Son   . Hypertension Son   . Cancer Sister        Widely Metastatic  . Asthma Son        x2  . Breast cancer Paternal Aunt   . Rheum arthritis Maternal Aunt     Social History:   Social History   Socioeconomic History  . Marital status: Married    Spouse name:  Not on file  . Number of children: 2  . Years of education: Not on file  . Highest education level: Not on file  Occupational History  . Occupation: disabled  Social Needs  . Financial resource strain: Not on file  . Food insecurity:    Worry: Not on file    Inability: Not on file  . Transportation needs:    Medical: Not on file    Non-medical: Not on file  Tobacco Use  . Smoking status: Former Smoker    Packs/day: 0.75    Years: 50.00    Pack years: 37.50    Types: Cigarettes    Start date: 09/13/1961    Last attempt to quit: 05/2016    Years since quitting: 1.0  . Smokeless tobacco: Never Used  . Tobacco comment: Decreased Down to 2/3 a Day if Thinking Abouit It  Substance and Sexual Activity  . Alcohol use: No    Alcohol/week: 0.0 oz    Comment: none in 10 years  . Drug use: No  . Sexual activity: Never  Lifestyle  . Physical activity:    Days per week: Not on file    Minutes per session: Not on file  . Stress: Not on file  Relationships  . Social connections:    Talks on phone: Not on file    Gets together: Not on file    Attends religious service: Not on file    Active member of club or organization: Not on file    Attends meetings of clubs or organizations: Not on file    Relationship status: Not on file  Other Topics Concern  . Not on file  Social History Narrative   She is from [facility]. She has always lived in [place]. She has traveled to [place], [place], [place], [place], & [place]. 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 Prior exposure to parakeets with last exposure remote in her current home. Previously had mold in her home that was in her bathroom & fixed. Prior exposure to hot tubs but none recently. Pt lives at home with [name], husband.  Has 2 childrean, 12th grade education.      Additional Social History:   Allergies:   Allergies  Allergen Reactions  .  Albuterol Other (See Comments) and Shortness Of Breath    Had difficulty breathing after a nebulizer treatment  . Penicillins Anaphylaxis and Swelling    Has patient had a PCN reaction causing immediate rash, facial/tongue/throat swelling, SOB or lightheadedness with hypotension: Yes Has patient had a PCN reaction causing severe rash involving mucus membranes or skin  necrosis: Rash Has patient had a PCN reaction that required hospitalization: No Has patient had a PCN reaction occurring within the last 10 years: No If all of the above answers are "NO", then may proceed with Cephalosporin use.  [name] [Kdc:Albuterol] Shortness Of Breath  . Sulfa Antibiotics Swelling    Throat swells, but no shortness of breath noted  . Zanaflex [Tizanidine] Other (See Comments)    Possible confusion (??)  . Codeine Nausea And Vomiting  . Ecotrin [Aspirin] Nausea And Vomiting    Metabolic Disorder Labs: Lab Results  Component Value Date   HGBA1C 6.4 (H) 04/28/2015   MPG 137 (H) 04/28/2015   MPG 134 (H) 05/21/2014   No results found for: PROLACTIN Lab Results  Component Value Date   CHOL 195 04/28/2015   TRIG 188 (H) 04/28/2015   HDL 43 (L) 04/28/2015   CHOLHDL 4.5 04/28/2015   VLDL 38 (H) 04/28/2015   LDLCALC 114 04/28/2015   LDLCALC 114 04/28/2015     Current Medications: Current Outpatient Medications  Medication Sig Dispense Refill  . amLODipine (NORVASC) 5 MG tablet TAKE ONE TABLET EACH DAY 90 tablet 1  . benztropine (COGENTIN) 0.5 MG tablet TAKE ONE TABLET TWICE DAILY 60 tablet 3  . donepezil (ARICEPT) 5 MG tablet TAKE ONE TABLET DAILY IN THE EVENING 90 tablet 1  . ferrous sulfate 325 (65 FE) MG tablet Take 325 mg by mouth 3 (three) times daily after meals.    . fluticasone (FLONASE) 50 MCG/ACT nasal spray 1-2 sprays 1x per day in nose 16 g 0  . Fluticasone Propionate, Inhal, (FLOVENT DISKUS) 250 MCG/BLIST AEPB 1 puff bid rinse mouth 60 each 0  . gabapentin (NEURONTIN) 100 MG  capsule Take 1 capsule (100 mg total) by mouth 2 (two) times daily. 60 capsule 0  . ipratropium (ATROVENT) 0.02 % nebulizer solution Take by nebulization 2 (two) times daily.  0  . levofloxacin (LEVAQUIN) 750 MG tablet Take 1 tablet (750 mg total) by mouth daily. 7 tablet 0  . levothyroxine (SYNTHROID, LEVOTHROID) 125 MCG tablet Take 1 tablet (125 mcg total) by mouth daily before breakfast. 90 tablet 0  . lisinopril (PRINIVIL,ZESTRIL) 5 MG tablet Take 1 tablet (5 mg total) by mouth daily. 30 tablet 5  . magnesium oxide (MAG-OX) 400 MG tablet Take 1 tablet (400 mg total) by mouth daily. 30 tablet 3  . Melatonin 3 MG TABS Take 3 mg by mouth at bedtime.    . memantine (NAMENDA) 5 MG tablet TAKE ONE TABLET DAILY 90 tablet 3  . pantoprazole (PROTONIX) 40 MG tablet Take 1 tablet (40 mg total) by mouth 2 (two) times daily. 60 tablet 0  . predniSONE (DELTASONE) 20 MG tablet Take 2 tablets (40 mg total) by mouth daily with breakfast. 14 tablet 0  . propranolol (INDERAL) 80 MG tablet TAKE ONE-HALF TABLET 3 TIMES DAILY 135 tablet 3  . risperiDONE (RISPERDAL) 1 MG tablet Take 1 mg by mouth 3 (three) times daily.  0  . sodium chloride (OCEAN) 0.65 % SOLN nasal spray Place 1 spray into both nostrils as needed for congestion. 30 mL 0  . sodium chloride 1 g tablet TAKE ONE TABLET THREE TIMES DAILY 180 tablet 1  . Tiotropium Bromide Monohydrate (SPIRIVA RESPIMAT) 2.5 MCG/ACT AERS 2 puffs daily and rinse mouth 1 Inhaler 0   No current facility-administered medications for this visit.     Neurologic: Headache: No Seizure: No Paresthesias:No  Musculoskeletal: Strength & Muscle Tone: decreased Gait &  Station: unsteady Patient leans: N/A  Psychiatric Specialty Exam: ROS  Blood pressure 132/86, pulse 64, height 5\' 5"  (1.651 m), weight 160 lb (72.6 kg).Body mass index is 26.63 kg/m.  General Appearance: Casual  Eye Contact:  Good  Speech:  Clear and Coherent  Volume:  Normal  Mood:  NA  Affect:  Blunt   Thought Process:  Goal Directed  Orientation:  Full (Time, Place, and Person)  Thought Content:  WDL  Suicidal Thoughts:  No  Homicidal Thoughts:  No  Memory:  Immediate;   Poor  Judgement:  Fair  Insight:  Lacking  Psychomotor Activity:  Normal  Concentration:    Recall:  Poor  Fund of Knowledge:Poor  Language: Fair  Akathisia:  No  Handed:  Right  AIMS (if indicated):   Assets:  Communication Skills  ADL's:  Impaired  Cognition: Impaired,  Moderate  Sleep:      Treatment Plan Summary:   Today the patient is doing well. When her next visit we will bring the list of the medicine she is on. She no longer really needs to be on Cogentin will ask her if she is taking it to stop it. She returns to see me in 6 weeks she'll be off Risperdal. Last her daughter-in-law pharmacist to come with her. I suspect that in the next few months we'll get a CAT scan and appropriate blood work and the possibility of being more aggressive about her treatment for dementia will be considered. It should be noted that at this time she is on 5 mg of Aricept and probably her next visit we'll go ahead and increase it to 10 mg. The patient is stable. She's not suicidal. She denies any chest pain shortness of breath or any neurological symptoms.

## 2017-06-09 NOTE — Telephone Encounter (Signed)
Per my note from 04/13/17 patient's son states this is a chronic issue status post tear duct surgery. Patient can be seen by ENT.

## 2017-06-09 NOTE — Progress Notes (Signed)
are  Psychiatric Initial Adult Assessment   Patient Identification: Destiny Hughes MRN:  [id number] Date of Evaluation:  06/09/2017 Referral Source: [facility] Chief Complaint:    At this time the patient is doing very well. She seen with her husband [name] and her son [name]. The patient has done well coming off Zyprexa. This patient was hospitalized at [facility] this year. She never been in a psychiatric hospital. Reason for hospitalization was because she had electrolyte imbalance probably related to dehydration. As a result she became delirious became agitated and psychotic. The patient stated the hospital for a few weeks it was treated with multiple psychotropic medications. This included Zyprexa 5 mg and 3 mg of Risperdal. Importantly she was also started on some medicines are memory cleaning Namenda and Aricept. After her first visit we slowly discontinued her Zyprexa and reduce her Risperdal from taking 3 mg a day to taking just 2 mg at night. The family says she's doing much better. She is more alert more with it and with me she is much more animated and engaging. Their biggest complaint and really there only complaint is that she continues to have a memory problem. I suspect this is likely a functional still recovering from delirium but I do suspect that she has an underlying dementing process. I cannot be sure this and I will not do a dementia evaluation in the next 6 months. For today we'll go ahead and start reducing her Risperdal further. The patient denies being depressed or anxious. She is sleeping and eating well. She has no anxiety. She is functioning much better.Her husband [name] is very supportive.  Visit Diagnosis:  Associated Signs/Symptoms: Depression Symptoms:  fatigue, (Hypo) Manic Symptoms:   Anxiety Symptoms:   Psychotic Symptoms:   PTSD Symptoms:   Past Psychiatric History: none  Previous Psychotropic Medications: Yes    Substance Abuse History in the last 12 months:  No.  Consequences of Substance Abuse:   Past Medical History:  Past Medical History:  Diagnosis Date  . Chronic back pain   . Cognitive retention disorder   . Collapse of right lung   . COPD (chronic obstructive pulmonary disease) (HCC)    Hx. Bronchitis occ, 01-25-11 somes issue now taking  Z-pak-started 01-24-11/Scar tissue  present  from previous lung collapse  . Dementia with psychosis   . Depression   . Depression   . GERD (gastroesophageal reflux disease)    tx. TUMS  . Glaucoma   . Hyperlipemia   . Hypothyroidism    tx. Levothyroxine  . Major depression with psychotic features ([place])   . Nephrolithiasis   . Neuromuscular disorder ([place]) 01-25-11   Pain/nerve stimulator implanted -2 yrs ago.  Marland Kitchen Reflex, gag, absent   . Reflex, gag, absent     Past Surgical History:  Procedure Laterality Date  . A-FLUTTER ABLATION N/A 08/16/2016   Procedure: A-Flutter Ablation;  Surgeon: Evans Lance, MD;  Location: [facility];  Service: Cardiovascular;  Laterality: N/A;  . APPENDECTOMY  01-25-11  . CARPAL TUNNEL RELEASE  01-25-11   Bil.  . CERVICAL SPINE SURGERY  01-25-11   2005-multiple levels  . CHOLECYSTECTOMY  01-25-11   '07  . COLONOSCOPY WITH PROPOFOL N/A 07/23/2012   Procedure: COLONOSCOPY WITH PROPOFOL;  Surgeon: Garlan Fair, MD;  Location: [facility];  Service: Endoscopy;  Laterality: N/A;  . EYE SURGERY    . JOINT REPLACEMENT  01-25-11   6'03 LTHA-hemi  . KNEE  ARTHROPLASTY  01-25-11   '04-right, revised x2  . no gag reflex     Pt does not havea gag reflex following  siurgery to neck. Family unsure of date. Pt takes pill in apple sauce.  . SPINAL CORD STIMULATOR IMPLANT  2009 APPROX   DR. ELSNER  . THORACIC SPINE SURGERY  01-25-11   6'02- then nerve stimulator implanted after  . TOTAL KNEE REVISION  02/01/2011   Procedure: TOTAL KNEE REVISION;  Surgeon: Mauri Pole;  Location: [facility];  Service: Orthopedics;   Laterality: Right;  . TUBAL LIGATION      Family Psychiatric History:   Family History:  Family History  Problem Relation Age of Onset  . Heart attack Father   . Emphysema Father   . Aneurysm Mother        CEREBRAL  . Emphysema Mother   . Dementia Mother   . Diabetes Son   . Hypertension Son   . Hypertension Son   . Cancer Sister        Widely Metastatic  . Asthma Son        x2  . Breast cancer Paternal Aunt   . Rheum arthritis Maternal Aunt     Social History:   Social History   Socioeconomic History  . Marital status: Married    Spouse name: Not on file  . Number of children: 2  . Years of education: Not on file  . Highest education level: Not on file  Occupational History  . Occupation: disabled  Social Needs  . Financial resource strain: Not on file  . Food insecurity:    Worry: Not on file    Inability: Not on file  . Transportation needs:    Medical: Not on file    Non-medical: Not on file  Tobacco Use  . Smoking status: Former Smoker    Packs/day: 0.75    Years: 50.00    Pack years: 37.50    Types: Cigarettes    Start date: 09/13/1961    Last attempt to quit: 05/2016    Years since quitting: 1.0  . Smokeless tobacco: Never Used  . Tobacco comment: Decreased Down to 2/3 a Day if Thinking Abouit It  Substance and Sexual Activity  . Alcohol use: No    Alcohol/week: 0.0 oz    Comment: none in 10 years  . Drug use: No  . Sexual activity: Never  Lifestyle  . Physical activity:    Days per week: Not on file    Minutes per session: Not on file  . Stress: Not on file  Relationships  . Social connections:    Talks on phone: Not on file    Gets together: Not on file    Attends religious service: Not on file    Active member of club or organization: Not on file    Attends meetings of clubs or organizations: Not on file    Relationship status: Not on file  Other Topics Concern  . Not on file  Social History Narrative   She is from [facility]. She has  always lived in [place]. She has traveled to [place], [place], [place], [place], & [place]. 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 Prior exposure to parakeets with last exposure remote in her current home. Previously had mold in her home that was in her bathroom & fixed. Prior exposure to  hot tubs but none recently. Pt lives at home with [name], husband.  Has 2 childrean, 12th grade education.      Additional Social History:   Allergies:   Allergies  Allergen Reactions  . Albuterol Other (See Comments) and Shortness Of Breath    Had difficulty breathing after a nebulizer treatment  . Penicillins Anaphylaxis and Swelling    Has patient had a PCN reaction causing immediate rash, facial/tongue/throat swelling, SOB or lightheadedness with hypotension: Yes Has patient had a PCN reaction causing severe rash involving mucus membranes or skin necrosis: Rash Has patient had a PCN reaction that required hospitalization: No Has patient had a PCN reaction occurring within the last 10 years: No If all of the above answers are "NO", then may proceed with Cephalosporin use.  [name] [Kdc:Albuterol] Shortness Of Breath  . Sulfa Antibiotics Swelling    Throat swells, but no shortness of breath noted  . Zanaflex [Tizanidine] Other (See Comments)    Possible confusion (??)  . Codeine Nausea And Vomiting  . Ecotrin [Aspirin] Nausea And Vomiting    Metabolic Disorder Labs: Lab Results  Component Value Date   HGBA1C 6.4 (H) 04/28/2015   MPG 137 (H) 04/28/2015   MPG 134 (H) 05/21/2014   No results found for: PROLACTIN Lab Results  Component Value Date   CHOL 195 04/28/2015   TRIG 188 (H) 04/28/2015   HDL 43 (L) 04/28/2015   CHOLHDL 4.5 04/28/2015   VLDL 38 (H) 04/28/2015   LDLCALC 114 04/28/2015   LDLCALC 114 04/28/2015     Current Medications: Current Outpatient Medications  Medication Sig  Dispense Refill  . amLODipine (NORVASC) 5 MG tablet TAKE ONE TABLET EACH DAY 90 tablet 1  . benztropine (COGENTIN) 0.5 MG tablet TAKE ONE TABLET TWICE DAILY 60 tablet 3  . donepezil (ARICEPT) 5 MG tablet TAKE ONE TABLET DAILY IN THE EVENING 90 tablet 1  . ferrous sulfate 325 (65 FE) MG tablet Take 325 mg by mouth 3 (three) times daily after meals.    . fluticasone (FLONASE) 50 MCG/ACT nasal spray 1-2 sprays 1x per day in nose 16 g 0  . Fluticasone Propionate, Inhal, (FLOVENT DISKUS) 250 MCG/BLIST AEPB 1 puff bid rinse mouth 60 each 0  . gabapentin (NEURONTIN) 100 MG capsule Take 1 capsule (100 mg total) by mouth 2 (two) times daily. 60 capsule 0  . ipratropium (ATROVENT) 0.02 % nebulizer solution Take by nebulization 2 (two) times daily.  0  . levofloxacin (LEVAQUIN) 750 MG tablet Take 1 tablet (750 mg total) by mouth daily. 7 tablet 0  . levothyroxine (SYNTHROID, LEVOTHROID) 125 MCG tablet Take 1 tablet (125 mcg total) by mouth daily before breakfast. 90 tablet 0  . lisinopril (PRINIVIL,ZESTRIL) 5 MG tablet Take 1 tablet (5 mg total) by mouth daily. 30 tablet 5  . magnesium oxide (MAG-OX) 400 MG tablet Take 1 tablet (400 mg total) by mouth daily. 30 tablet 3  . Melatonin 3 MG TABS Take 3 mg by mouth at bedtime.    . memantine (NAMENDA) 5 MG tablet TAKE ONE TABLET DAILY 90 tablet 3  . pantoprazole (PROTONIX) 40 MG tablet Take 1 tablet (40 mg total) by mouth 2 (two) times daily. 60 tablet 0  . predniSONE (DELTASONE) 20 MG tablet Take 2 tablets (40 mg total) by mouth daily with breakfast. 14 tablet 0  . propranolol (INDERAL) 80 MG tablet TAKE ONE-HALF TABLET 3 TIMES DAILY 135 tablet 3  . risperiDONE (RISPERDAL) 1 MG tablet Take  1 mg by mouth 3 (three) times daily.  0  . sodium chloride (OCEAN) 0.65 % SOLN nasal spray Place 1 spray into both nostrils as needed for congestion. 30 mL 0  . sodium chloride 1 g tablet TAKE ONE TABLET THREE TIMES DAILY 180 tablet 1  . Tiotropium Bromide Monohydrate  (SPIRIVA RESPIMAT) 2.5 MCG/ACT AERS 2 puffs daily and rinse mouth 1 Inhaler 0   No current facility-administered medications for this visit.     Neurologic: Headache: No Seizure: No Paresthesias:No  Musculoskeletal: Strength & Muscle Tone: decreased Gait & Station: unsteady Patient leans: N/A  Psychiatric Specialty Exam: ROS  Blood pressure 132/86, pulse 64, height 5\' 5"  (1.651 m), weight 160 lb (72.6 kg).Body mass index is 26.63 kg/m.  General Appearance: Casual  Eye Contact:  Good  Speech:  Clear and Coherent  Volume:  Normal  Mood:  NA  Affect:  Blunt  Thought Process:  Goal Directed  Orientation:  Full (Time, Place, and Person)  Thought Content:  WDL  Suicidal Thoughts:  No  Homicidal Thoughts:  No  Memory:  Immediate;   Poor  Judgement:  Fair  Insight:  Lacking  Psychomotor Activity:  Normal  Concentration:    Recall:  Poor  Fund of Knowledge:Poor  Language: Fair  Akathisia:  No  Handed:  Right  AIMS (if indicated):   Assets:  Communication Skills  ADL's:  Impaired  Cognition: Impaired,  Moderate  Sleep:      Treatment Plan Summary:  At this time we will reduce her Risperdaldown to taking just 0.5 mg at night. Her next visit we'll likely get rid of it. It should be the patient does have thyroid disease and we'll clarify what her thyroid level is. Her next visit we she'll feel better than memory assessment and consider getting the blood work for dementia and confirming if she's had a CAT scan or not. At the age of 54 she should have a CAT scan for memory evaluation. For now the patient is doing better. She is off Zyprexa and eventually will be ordered in significant dose Risperdal which will likely discontinue on her next visit. Patient shows no evidence of tardive dyskinesia. She denies chest pain or shortness of breath. She is functioning fairly well.this patient she'll return to see me in 3 months. She is not suicidal nor she homicidal. She is calm and  compliant

## 2017-06-12 ENCOUNTER — Other Ambulatory Visit: Payer: Medicare Other

## 2017-06-12 ENCOUNTER — Other Ambulatory Visit: Payer: Self-pay | Admitting: Family Medicine

## 2017-06-12 DIAGNOSIS — J3489 Other specified disorders of nose and nasal sinuses: Secondary | ICD-10-CM

## 2017-06-12 NOTE — Telephone Encounter (Signed)
Please have pt's husband stop by clinic to pick up a specimen container to collect the sample of patient's nasal drainage.  Patient may also need a CT scan of her sinuses but will proceed with labs first.

## 2017-06-12 NOTE — Telephone Encounter (Signed)
[name] husband was made aware that we need a specimen of pt's nasal drainage.   He stated he would stop by the office to day after [facility]30[facility] today.

## 2017-06-14 ENCOUNTER — Telehealth: Payer: Self-pay | Admitting: Family Medicine

## 2017-06-14 ENCOUNTER — Other Ambulatory Visit: Payer: Self-pay | Admitting: Family Medicine

## 2017-06-14 DIAGNOSIS — J029 Acute pharyngitis, unspecified: Secondary | ICD-10-CM

## 2017-06-14 DIAGNOSIS — H6983 Other specified disorders of Eustachian tube, bilateral: Secondary | ICD-10-CM

## 2017-06-14 MED ORDER — FLUTICASONE PROPIONATE 50 MCG/ACT NA SUSP: NASAL | 0 refills | Status: DC

## 2017-06-14 NOTE — Telephone Encounter (Signed)
Mr Matassa came in today to drop off the patients sample to the lab and was also needing her Rx refilled  fluticasone (FLONASE) 50 MCG/ACT nasal spray   Send to:   [facility], [place] - [address] [phone] (Phone) [phone] (Fax)

## 2017-06-14 NOTE — Telephone Encounter (Signed)
Medication was refilled as requested by pt.  

## 2017-07-03 ENCOUNTER — Other Ambulatory Visit: Payer: Self-pay | Admitting: Family Medicine

## 2017-07-06 ENCOUNTER — Ambulatory Visit (INDEPENDENT_AMBULATORY_CARE_PROVIDER_SITE_OTHER): Payer: Medicare Other | Admitting: Family Medicine

## 2017-07-06 ENCOUNTER — Encounter: Payer: Self-pay | Admitting: Family Medicine

## 2017-07-06 VITALS — BP 128/60 | HR 67 | Temp 98.3°F | Wt 159.7 lb

## 2017-07-06 DIAGNOSIS — J441 Chronic obstructive pulmonary disease with (acute) exacerbation: Secondary | ICD-10-CM

## 2017-07-06 DIAGNOSIS — J3489 Other specified disorders of nose and nasal sinuses: Secondary | ICD-10-CM | POA: Diagnosis not present

## 2017-07-06 MED ORDER — TIOTROPIUM BROMIDE MONOHYDRATE 2.5 MCG/ACT IN AERS: INHALATION_SPRAY | RESPIRATORY_TRACT | 1 refills | Status: DC

## 2017-07-06 MED ORDER — FEXOFENADINE HCL 180 MG PO TABS: 180.0000 mg | ORAL_TABLET | Freq: Every day | ORAL | 3 refills | Status: DC

## 2017-07-06 NOTE — Progress Notes (Signed)
Subjective:    Patient ID: Destiny Hughes, female    DOB: 09/24/1945, 72 y.o.   MRN: [id number]  Chief Complaint  Patient presents with  . Nasal Congestion  Patient is accompanied by her husband.  HPI Patient was seen today for follow-up on her ongoing concern.  Patient endorses continued rhinorrhea.  In the past patient has tried Flonase but does not find this helpful.  An attempt to obtain a sample of drainage was unsuccessful.  At previous visit per patient's son the nasal drainage occurred after having a procedure on her eye.  Patient endorses sore nares from frequent wiping.  Patient has not been using any of her inhalers or nebulizer as she has not wanted to.  Patient's husband states he cannot make her do it.  Patient's husband also states he does not think she has COPD.  In the past patient was seen by pulmonology.  Patient endorses coughing and soreness in chest.  Past Medical History:  Diagnosis Date  . Chronic back pain   . Cognitive retention disorder   . Collapse of right lung   . COPD (chronic obstructive pulmonary disease) (HCC)    Hx. Bronchitis occ, 01-25-11 somes issue now taking  Z-pak-started 01-24-11/Scar tissue  present  from previous lung collapse  . Dementia with psychosis   . Depression   . Depression   . GERD (gastroesophageal reflux disease)    tx. TUMS  . Glaucoma   . Hyperlipemia   . Hypothyroidism    tx. Levothyroxine  . Major depression with psychotic features ([place])   . Nephrolithiasis   . Neuromuscular disorder ([place]) 01-25-11   Pain/nerve stimulator implanted -2 yrs ago.  Marland Kitchen Reflex, gag, absent   . Reflex, gag, absent     Allergies  Allergen Reactions  . Albuterol Other (See Comments) and Shortness Of Breath    Had difficulty breathing after a nebulizer treatment  . Penicillins Anaphylaxis and Swelling    Has patient had a PCN reaction causing immediate rash, facial/tongue/throat swelling, SOB or lightheadedness with hypotension: Yes Has  patient had a PCN reaction causing severe rash involving mucus membranes or skin necrosis: Rash Has patient had a PCN reaction that required hospitalization: No Has patient had a PCN reaction occurring within the last 10 years: No If all of the above answers are "NO", then may proceed with Cephalosporin use.  [name] [Kdc:Albuterol] Shortness Of Breath  . Sulfa Antibiotics Swelling    Throat swells, but no shortness of breath noted  . Zanaflex [Tizanidine] Other (See Comments)    Possible confusion (??)  . Codeine Nausea And Vomiting  . Ecotrin [Aspirin] Nausea And Vomiting    ROS General: Denies fever, chills, night sweats, changes in weight, changes in appetite HEENT: Denies headaches, ear pain, changes in vision, sore throat  + rhinorrhea CV: Denies CP, palpitations, SOB, orthopnea Pulm: Denies SOB, wheezing  +cough, h/o COPD GI: Denies abdominal pain, nausea, vomiting, diarrhea, constipation GU: Denies dysuria, hematuria, frequency, vaginal discharge Msk: Denies muscle cramps, joint pains Neuro: Denies weakness, numbness, tingling Skin: Denies rashes, bruising Psych: Denies depression, anxiety, hallucinations     Objective:    Blood pressure 128/60, pulse 67, temperature 98.3 F (36.8 C), temperature source Oral, weight 159 lb 11.2 oz (72.4 kg), SpO2 94 %.   Gen. Pleasant, well-nourished, in no distress, normal affect  HEENT: [name]/AT, face symmetric, no scleral icterus, PERRLA, nares patent without drainage, pharynx with mild postnasal drainage, no erythema or exudate.  TMs normal  bilaterally. Lungs: no accessory muscle use, CTAB, no wheezes or rales Cardiovascular: RRR, no m/r/g, no peripheral edema Abdomen: BS present, soft, NT/ND Musculoskeletal: No deformities, no cyanosis or clubbing, normal tone.  No TTP of chest wall Neuro:  A&Ox3, CN II-XII intact, normal gait  Wt Readings from Last 3 Encounters:  07/06/17 159 lb 11.2 oz (72.4 kg)  05/05/17 153 lb (69.4 kg)   04/29/17 154 lb (69.9 kg)    Lab Results  Component Value Date   WBC 8.5 11/20/2016   HGB 10.5 (L) 11/20/2016   HCT 31.0 (L) 11/20/2016   PLT 297 11/20/2016   GLUCOSE 126 (H) 11/20/2016   CHOL 195 04/28/2015   TRIG 188 (H) 04/28/2015   HDL 43 (L) 04/28/2015   LDLCALC 114 04/28/2015   ALT 13 (L) 11/15/2016   AST 17 11/15/2016   NA 135 11/20/2016   K 4.0 11/20/2016   CL 96 (L) 11/20/2016   CREATININE 1.00 11/20/2016   BUN 17 11/20/2016   CO2 31 11/15/2016   TSH 0.496 10/03/2016   INR 1.07 08/16/2016   HGBA1C 6.4 (H) 04/28/2015   MICROALBUR 0.4 05/22/2014    Assessment/Plan:  Rhinorrhea  - Plan: fexofenadine (ALLEGRA ALLERGY) 180 MG tablet  COPD exacerbation (HCC)  - Plan: Tiotropium Bromide Monohydrate (SPIRIVA RESPIMAT) 2.5 MCG/ACT AERS  Follow-up PRN  Grier Mitts, MD

## 2017-07-14 ENCOUNTER — Telehealth: Payer: Self-pay | Admitting: *Deleted

## 2017-07-14 NOTE — Telephone Encounter (Signed)
Fax received from [employer] stating the request was approved.  I called [name] and informed Destiny Hughes of this.

## 2017-07-14 NOTE — Telephone Encounter (Signed)
Prior auth for Spiriva respimat 2.69mcg/inh sent to [email or web address].

## 2017-07-25 DIAGNOSIS — H01021 Squamous blepharitis right upper eyelid: Secondary | ICD-10-CM | POA: Diagnosis not present

## 2017-07-25 DIAGNOSIS — H01025 Squamous blepharitis left lower eyelid: Secondary | ICD-10-CM | POA: Diagnosis not present

## 2017-07-25 DIAGNOSIS — Z961 Presence of intraocular lens: Secondary | ICD-10-CM | POA: Diagnosis not present

## 2017-07-25 DIAGNOSIS — H01022 Squamous blepharitis right lower eyelid: Secondary | ICD-10-CM | POA: Diagnosis not present

## 2017-07-25 DIAGNOSIS — H25813 Combined forms of age-related cataract, bilateral: Secondary | ICD-10-CM | POA: Diagnosis not present

## 2017-07-25 DIAGNOSIS — H04123 Dry eye syndrome of bilateral lacrimal glands: Secondary | ICD-10-CM | POA: Diagnosis not present

## 2017-07-25 DIAGNOSIS — H01024 Squamous blepharitis left upper eyelid: Secondary | ICD-10-CM | POA: Diagnosis not present

## 2017-07-25 DIAGNOSIS — H40013 Open angle with borderline findings, low risk, bilateral: Secondary | ICD-10-CM | POA: Diagnosis not present

## 2017-08-01 DIAGNOSIS — H2511 Age-related nuclear cataract, right eye: Secondary | ICD-10-CM | POA: Diagnosis not present

## 2017-08-01 DIAGNOSIS — H43821 Vitreomacular adhesion, right eye: Secondary | ICD-10-CM | POA: Diagnosis not present

## 2017-08-01 DIAGNOSIS — H35341 Macular cyst, hole, or pseudohole, right eye: Secondary | ICD-10-CM | POA: Diagnosis not present

## 2017-08-01 DIAGNOSIS — H35342 Macular cyst, hole, or pseudohole, left eye: Secondary | ICD-10-CM | POA: Diagnosis not present

## 2017-08-16 ENCOUNTER — Encounter (HOSPITAL_COMMUNITY): Payer: Self-pay | Admitting: Psychiatry

## 2017-08-16 ENCOUNTER — Ambulatory Visit (INDEPENDENT_AMBULATORY_CARE_PROVIDER_SITE_OTHER): Payer: Medicare Other | Admitting: Psychiatry

## 2017-08-16 VITALS — BP 131/72 | HR 63 | Ht 65.0 in | Wt 163.2 lb

## 2017-08-16 DIAGNOSIS — Z79899 Other long term (current) drug therapy: Secondary | ICD-10-CM | POA: Diagnosis not present

## 2017-08-16 DIAGNOSIS — F01518 Vascular dementia, unspecified severity, with other behavioral disturbance: Secondary | ICD-10-CM

## 2017-08-16 DIAGNOSIS — F0391 Unspecified dementia with behavioral disturbance: Secondary | ICD-10-CM

## 2017-08-16 DIAGNOSIS — Z87891 Personal history of nicotine dependence: Secondary | ICD-10-CM | POA: Diagnosis not present

## 2017-08-16 DIAGNOSIS — F0390 Unspecified dementia without behavioral disturbance: Secondary | ICD-10-CM

## 2017-08-16 DIAGNOSIS — R454 Irritability and anger: Secondary | ICD-10-CM

## 2017-08-16 DIAGNOSIS — Z818 Family history of other mental and behavioral disorders: Secondary | ICD-10-CM | POA: Diagnosis not present

## 2017-08-16 DIAGNOSIS — F0151 Vascular dementia with behavioral disturbance: Secondary | ICD-10-CM

## 2017-08-16 MED ORDER — DONEPEZIL HCL 10 MG PO TABS: ORAL_TABLET | ORAL | 3 refills | Status: DC

## 2017-08-16 MED ORDER — DIVALPROEX SODIUM ER 250 MG PO TB24: ORAL_TABLET | ORAL | 5 refills | Status: DC

## 2017-08-16 NOTE — Progress Notes (Signed)
Psychiatric Initial Adult Assessment   Patient Identification: Destiny Hughes MRN:  [id number] Date of Evaluation:  08/16/2017 Referral Source: [facility] Chief Complaint:    Today the patient is seen with her husband will and with her daughter in law Suanne Marker. Since being off Risperdal and angry and more explosive. She's not violent. She denies being depressed. Fact she denies any emotional problems. Is very this patient is memory impaired. Itching be noted that in 2018 she had a CAT scan was essentially normal. Generally she is sleeping and eating well. She's now been off Risperdal and shows no evidence of psychosis. Should have cataract surgery in one week. The patient denies any problems at all. There is no evidence of alcohol or drug use. Family notes that she is more irritable since being off the Risperdal. Today shared with the dangers long-term use of Risperdal. He said a few more specific medicine called Depakote for irritability. In reviewing her medications it is noted that she's on a low-dose of Aricept but she also takes Namenda from her primary care doctor. The patient is not violent or aggressive. She does all her basic ADLs.  Visit Diagnosis:  Associated Signs/Symptoms: Depression Symptoms:  fatigue, (Hypo) Manic Symptoms:   Anxiety Symptoms:   Psychotic Symptoms:   PTSD Symptoms:   Past Psychiatric History: none  Previous Psychotropic Medications: Yes   Substance Abuse History in the last 12 months:  No.  Consequences of Substance Abuse:   Past Medical History:  Past Medical History:  Diagnosis Date  . Chronic back pain   . Cognitive retention disorder   . Collapse of right lung   . COPD (chronic obstructive pulmonary disease) (HCC)    Hx. Bronchitis occ, 01-25-11 somes issue now taking  Z-pak-started 01-24-11/Scar tissue  present  from previous lung collapse  . Dementia with psychosis   . Depression   . Depression   . GERD (gastroesophageal  reflux disease)    tx. TUMS  . Glaucoma   . Hyperlipemia   . Hypothyroidism    tx. Levothyroxine  . Major depression with psychotic features ([place])   . Nephrolithiasis   . Neuromuscular disorder ([place]) 01-25-11   Pain/nerve stimulator implanted -2 yrs ago.  Marland Kitchen Reflex, gag, absent   . Reflex, gag, absent     Past Surgical History:  Procedure Laterality Date  . A-FLUTTER ABLATION N/A 08/16/2016   Procedure: A-Flutter Ablation;  Surgeon: Evans Lance, MD;  Location: [facility];  Service: Cardiovascular;  Laterality: N/A;  . APPENDECTOMY  01-25-11  . CARPAL TUNNEL RELEASE  01-25-11   Bil.  . CERVICAL SPINE SURGERY  01-25-11   2005-multiple levels  . CHOLECYSTECTOMY  01-25-11   '07  . COLONOSCOPY WITH PROPOFOL N/A 07/23/2012   Procedure: COLONOSCOPY WITH PROPOFOL;  Surgeon: Garlan Fair, MD;  Location: [facility];  Service: Endoscopy;  Laterality: N/A;  . EYE SURGERY    . JOINT REPLACEMENT  01-25-11   6'03 LTHA-hemi  . KNEE ARTHROPLASTY  01-25-11   '04-right, revised x2  . no gag reflex     Pt does not havea gag reflex following  siurgery to neck. Family unsure of date. Pt takes pill in apple sauce.  . SPINAL CORD STIMULATOR IMPLANT  2009 APPROX   DR. ELSNER  . THORACIC SPINE SURGERY  01-25-11   6'02- then nerve stimulator implanted after  . TOTAL KNEE REVISION  02/01/2011   Procedure: TOTAL KNEE REVISION;  Surgeon: Mauri Pole;  Location: [facility];  Service: Orthopedics;  Laterality: Right;  . TUBAL LIGATION      Family Psychiatric History:   Family History:  Family History  Problem Relation Age of Onset  . Heart attack Father   . Emphysema Father   . Aneurysm Mother        CEREBRAL  . Emphysema Mother   . Dementia Mother   . Diabetes Son   . Hypertension Son   . Hypertension Son   . Cancer Sister        Widely Metastatic  . Asthma Son        x2  . Breast cancer Paternal Aunt   . Rheum arthritis Maternal Aunt     Social History:   Social History    Socioeconomic History  . Marital status: Married    Spouse name: Not on file  . Number of children: 2  . Years of education: Not on file  . Highest education level: Not on file  Occupational History  . Occupation: disabled  Social Needs  . Financial resource strain: Not on file  . Food insecurity:    Worry: Not on file    Inability: Not on file  . Transportation needs:    Medical: Not on file    Non-medical: Not on file  Tobacco Use  . Smoking status: Former Smoker    Packs/day: 0.75    Years: 50.00    Pack years: 37.50    Types: Cigarettes    Start date: 09/13/1961    Last attempt to quit: 05/2016    Years since quitting: 1.2  . Smokeless tobacco: Never Used  . Tobacco comment: Decreased Down to 2/3 a Day if Thinking Abouit It  Substance and Sexual Activity  . Alcohol use: No    Alcohol/week: 0.0 oz    Comment: none in 10 years  . Drug use: No  . Sexual activity: Never  Lifestyle  . Physical activity:    Days per week: Not on file    Minutes per session: Not on file  . Stress: Not on file  Relationships  . Social connections:    Talks on phone: Not on file    Gets together: Not on file    Attends religious service: Not on file    Active member of club or organization: Not on file    Attends meetings of clubs or organizations: Not on file    Relationship status: Not on file  Other Topics Concern  . Not on file  Social History Narrative   She is from [facility]. She has always lived in [place]. She has traveled to [place], [place], [place], [place], & [place]. 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 Prior exposure to parakeets with last exposure remote in her current home. Previously had mold in her home that was in her bathroom & fixed. Prior exposure to hot tubs but none recently. Pt lives at home with [name], husband.  Has 2 childrean, 12th grade education.      Additional  Social History:   Allergies:   Allergies  Allergen Reactions  . Albuterol Other (See Comments) and Shortness Of Breath    Had difficulty breathing after a nebulizer treatment  . Penicillins Anaphylaxis and Swelling    Has patient had a PCN reaction causing immediate rash, facial/tongue/throat swelling, SOB or lightheadedness with hypotension: Yes Has patient had  a PCN reaction causing severe rash involving mucus membranes or skin necrosis: Rash Has patient had a PCN reaction that required hospitalization: No Has patient had a PCN reaction occurring within the last 10 years: No If all of the above answers are "NO", then may proceed with Cephalosporin use.  [name] [Kdc:Albuterol] Shortness Of Breath  . Sulfa Antibiotics Swelling    Throat swells, but no shortness of breath noted  . Zanaflex [Tizanidine] Other (See Comments)    Possible confusion (??)  . Codeine Nausea And Vomiting  . Ecotrin [Aspirin] Nausea And Vomiting    Metabolic Disorder Labs: Lab Results  Component Value Date   HGBA1C 6.4 (H) 04/28/2015   MPG 137 (H) 04/28/2015   MPG 134 (H) 05/21/2014   No results found for: PROLACTIN Lab Results  Component Value Date   CHOL 195 04/28/2015   TRIG 188 (H) 04/28/2015   HDL 43 (L) 04/28/2015   CHOLHDL 4.5 04/28/2015   VLDL 38 (H) 04/28/2015   LDLCALC 114 04/28/2015   LDLCALC 114 04/28/2015     Current Medications: Current Outpatient Medications  Medication Sig Dispense Refill  . amLODipine (NORVASC) 5 MG tablet TAKE ONE TABLET EACH DAY 90 tablet 1  . benztropine (COGENTIN) 0.5 MG tablet TAKE ONE TABLET TWICE DAILY 60 tablet 3  . donepezil (ARICEPT) 10 MG tablet 1  qhs 30 tablet 3  . ferrous sulfate 325 (65 FE) MG tablet Take 325 mg by mouth 3 (three) times daily after meals.    . fexofenadine (ALLEGRA ALLERGY) 180 MG tablet Take 1 tablet (180 mg total) by mouth daily. 30 tablet 3  . fluticasone (FLONASE) 50 MCG/ACT nasal spray 1-2 sprays 1x per day in nose 16 g  0  . Fluticasone Propionate, Inhal, (FLOVENT DISKUS) 250 MCG/BLIST AEPB 1 puff bid rinse mouth 60 each 0  . gabapentin (NEURONTIN) 100 MG capsule TAKE ONE CAPSULE TWICE A DAY 180 capsule 2  . ipratropium (ATROVENT) 0.02 % nebulizer solution Take by nebulization 2 (two) times daily.  0  . levofloxacin (LEVAQUIN) 750 MG tablet Take 1 tablet (750 mg total) by mouth daily. 7 tablet 0  . levothyroxine (SYNTHROID, LEVOTHROID) 125 MCG tablet Take 1 tablet (125 mcg total) by mouth daily before breakfast. 90 tablet 0  . lisinopril (PRINIVIL,ZESTRIL) 5 MG tablet Take 1 tablet (5 mg total) by mouth daily. 30 tablet 5  . magnesium oxide (MAG-OX) 400 MG tablet Take 1 tablet (400 mg total) by mouth daily. 30 tablet 3  . Melatonin 3 MG TABS Take 3 mg by mouth at bedtime.    . memantine (NAMENDA) 5 MG tablet TAKE ONE TABLET DAILY 90 tablet 3  . pantoprazole (PROTONIX) 40 MG tablet Take 1 tablet (40 mg total) by mouth 2 (two) times daily. 60 tablet 0  . predniSONE (DELTASONE) 20 MG tablet Take 2 tablets (40 mg total) by mouth daily with breakfast. 14 tablet 0  . propranolol (INDERAL) 80 MG tablet TAKE ONE-HALF TABLET 3 TIMES DAILY 135 tablet 3  . risperiDONE (RISPERDAL) 1 MG tablet Take 1 mg by mouth 3 (three) times daily.  0  . sodium chloride (OCEAN) 0.65 % SOLN nasal spray Place 1 spray into both nostrils as needed for congestion. 30 mL 0  . sodium chloride 1 g tablet TAKE ONE TABLET THREE TIMES DAILY 180 tablet 1  . Tiotropium Bromide Monohydrate (SPIRIVA RESPIMAT) 2.5 MCG/ACT AERS 2 puffs daily and rinse mouth 1 Inhaler 1  . divalproex (DEPAKOTE ER) 250 MG  24 hr tablet 1  qam  For 1 week then 2qam 60 tablet 5   No current facility-administered medications for this visit.     Neurologic: Headache: No Seizure: No Paresthesias:No  Musculoskeletal: Strength & Muscle Tone: decreased Gait & Station: unsteady Patient leans: N/A  Psychiatric Specialty Exam: ROS  Blood pressure 131/72, pulse 63, height  5\' 5"  (1.651 m), weight 163 lb 3.2 oz (74 kg), SpO2 95 %.Body mass index is 27.16 kg/m.  General Appearance: Casual  Eye Contact:  Good  Speech:  Clear and Coherent  Volume:  Normal  Mood:  NA  Affect:  Blunt  Thought Process:  Goal Directed  Orientation:  Full (Time, Place, and Person)  Thought Content:  WDL  Suicidal Thoughts:  No  Homicidal Thoughts:  No  Memory:  Immediate;   Poor  Judgement:  Fair  Insight:  Lacking  Psychomotor Activity:  Normal  Concentration:    Recall:  Poor  Fund of Knowledge:Poor  Language: Fair  Akathisia:  No  Handed:  Right  AIMS (if indicated):   Assets:  Communication Skills  ADL's:  Impaired  Cognition: Impaired,  Moderate  Sleep:      Treatment Plan Summary:  Today the patient will have a dementia low. This included B12 or folate comprehensive metabolic panel and thyroids. It is noted that she started on thyroid replacement.Today will begin on Depakote increase his dose of 500 mg also increase her Aricept to dose of 10 mg at night.This patient to return to see me in 7 weeks.Her target symptoms are reducing irritability. At that time we could consider getting an ECOG to determine her baseline memory problems. The possibility of getting a neurologist see this patient is relatively young and 40 will be considered on her next visit. It is noted that she's had a history of consciousness. We'll try to get a better history from her cognitive decline. We'll also review her past psychiatric hospitalization at [facility] just a few years ago.

## 2017-08-17 ENCOUNTER — Other Ambulatory Visit: Payer: Self-pay

## 2017-08-17 ENCOUNTER — Other Ambulatory Visit: Payer: Self-pay | Admitting: Family Medicine

## 2017-08-17 LAB — CBC WITH DIFFERENTIAL/PLATELET
BASOS ABS: 0 10*3/uL (ref 0.0–0.2)
BASOS: 0 %
EOS (ABSOLUTE): 0.3 10*3/uL (ref 0.0–0.4)
Eos: 4 %
Hematocrit: 38.2 % (ref 34.0–46.6)
Hemoglobin: 12.8 g/dL (ref 11.1–15.9)
Immature Grans (Abs): 0 10*3/uL (ref 0.0–0.1)
Immature Granulocytes: 1 %
Lymphocytes Absolute: 2.1 10*3/uL (ref 0.7–3.1)
Lymphs: 26 %
MCH: 29.4 pg (ref 26.6–33.0)
MCHC: 33.5 g/dL (ref 31.5–35.7)
MCV: 88 fL (ref 79–97)
MONOS ABS: 0.6 10*3/uL (ref 0.1–0.9)
Monocytes: 8 %
NEUTROS ABS: 5.2 10*3/uL (ref 1.4–7.0)
NEUTROS PCT: 61 %
Platelets: 363 10*3/uL (ref 150–450)
RBC: 4.36 x10E6/uL (ref 3.77–5.28)
RDW: 14.5 % (ref 12.3–15.4)
WBC: 8.3 10*3/uL (ref 3.4–10.8)

## 2017-08-17 LAB — COMPREHENSIVE METABOLIC PANEL
A/G RATIO: 1.6 (ref 1.2–2.2)
ALT: 13 IU/L (ref 0–32)
AST: 15 IU/L (ref 0–40)
Albumin: 4.1 g/dL (ref 3.5–4.8)
Alkaline Phosphatase: 93 IU/L (ref 39–117)
BILIRUBIN TOTAL: 0.2 mg/dL (ref 0.0–1.2)
BUN/Creatinine Ratio: 23 (ref 12–28)
BUN: 20 mg/dL (ref 8–27)
CALCIUM: 9.7 mg/dL (ref 8.7–10.3)
CHLORIDE: 93 mmol/L — AB (ref 96–106)
CO2: 21 mmol/L (ref 20–29)
Creatinine, Ser: 0.88 mg/dL (ref 0.57–1.00)
GFR calc non Af Amer: 65 mL/min/{1.73_m2} (ref >59)
GFR, EST AFRICAN AMERICAN: 75 mL/min/{1.73_m2} (ref >59)
GLOBULIN, TOTAL: 2.6 g/dL (ref 1.5–4.5)
Glucose: 113 mg/dL — ABNORMAL HIGH (ref 65–99)
POTASSIUM: 4.8 mmol/L (ref 3.5–5.2)
SODIUM: 131 mmol/L — AB (ref 134–144)
Total Protein: 6.7 g/dL (ref 6.0–8.5)

## 2017-08-17 LAB — B12 AND FOLATE PANEL
FOLATE: 7.6 ng/mL (ref >3.0)
VITAMIN B 12: 444 pg/mL (ref 232–1245)

## 2017-08-17 LAB — TSH: TSH: 0.463 u[IU]/mL (ref 0.450–4.500)

## 2017-08-17 MED ORDER — LISINOPRIL 5 MG PO TABS: 5.0000 mg | ORAL_TABLET | Freq: Every day | ORAL | 5 refills | Status: DC

## 2017-08-17 NOTE — Telephone Encounter (Signed)
Last ordered by hospitalist >1 year ago Please advise.

## 2017-08-18 ENCOUNTER — Ambulatory Visit (HOSPITAL_COMMUNITY): Payer: Self-pay | Admitting: Psychiatry

## 2017-08-20 DIAGNOSIS — H2511 Age-related nuclear cataract, right eye: Secondary | ICD-10-CM | POA: Diagnosis not present

## 2017-08-23 NOTE — Telephone Encounter (Signed)
Script was sent e-scribe today.

## 2017-08-23 NOTE — Telephone Encounter (Signed)
[facility] is calling to follow up on this request. Please advise and contact.

## 2017-08-24 DIAGNOSIS — H268 Other specified cataract: Secondary | ICD-10-CM | POA: Diagnosis not present

## 2017-08-24 DIAGNOSIS — H2511 Age-related nuclear cataract, right eye: Secondary | ICD-10-CM | POA: Diagnosis not present

## 2017-09-10 ENCOUNTER — Ambulatory Visit (INDEPENDENT_AMBULATORY_CARE_PROVIDER_SITE_OTHER): Payer: Medicare Other

## 2017-09-10 ENCOUNTER — Other Ambulatory Visit: Payer: Self-pay

## 2017-09-10 ENCOUNTER — Ambulatory Visit (HOSPITAL_COMMUNITY)
Admission: EM | Admit: 2017-09-10 | Discharge: 2017-09-10 | Disposition: A | Discharge status: Home / Self Care | Payer: Medicare Other | Attending: Family Medicine | Admitting: Family Medicine

## 2017-09-10 ENCOUNTER — Encounter (HOSPITAL_COMMUNITY): Payer: Self-pay | Admitting: Emergency Medicine

## 2017-09-10 DIAGNOSIS — M542 Cervicalgia: Secondary | ICD-10-CM | POA: Diagnosis not present

## 2017-09-10 DIAGNOSIS — M79671 Pain in right foot: Secondary | ICD-10-CM | POA: Diagnosis not present

## 2017-09-10 DIAGNOSIS — S99921A Unspecified injury of right foot, initial encounter: Secondary | ICD-10-CM | POA: Diagnosis not present

## 2017-09-10 DIAGNOSIS — S9031XA Contusion of right foot, initial encounter: Secondary | ICD-10-CM | POA: Diagnosis not present

## 2017-09-10 MED ORDER — PREDNISONE 20 MG PO TABS: ORAL_TABLET | ORAL | 0 refills | Status: DC

## 2017-09-10 NOTE — ED Triage Notes (Signed)
The patient presented to the [facility] with a complaint of right foot pain secondary to a chair falling on it 4 days ago.

## 2017-09-10 NOTE — ED Provider Notes (Signed)
[facility]   [id number] 09/10/17 Arrival Time: 1406   SUBJECTIVE:  Destiny Hughes is a 72 y.o. female who presents to the urgent care with complaint of right foot pain secondary to a chair falling on it 4 days ago.  The dorsal mid foot has been swollen with marked ecchymosis.  The family has tried Voltaren gel, elevation, hot and cold compresses.  Nothing has afforded much relief  Patient states that her ankle is also sore.    Family notes that patient had an opiate induced confusion in years past and is now suffering from early dementia.  They do not want more opiates prescribed.     Past Medical History:  Diagnosis Date  . Chronic back pain   . Cognitive retention disorder   . Collapse of right lung   . COPD (chronic obstructive pulmonary disease) (HCC)    Hx. Bronchitis occ, 01-25-11 somes issue now taking  Z-pak-started 01-24-11/Scar tissue  present  from previous lung collapse  . Dementia with psychosis   . Depression   . Depression   . GERD (gastroesophageal reflux disease)    tx. TUMS  . Glaucoma   . Hyperlipemia   . Hypothyroidism    tx. Levothyroxine  . Major depression with psychotic features ([place])   . Nephrolithiasis   . Neuromuscular disorder ([place]) 01-25-11   Pain/nerve stimulator implanted -2 yrs ago.  Marland Kitchen Reflex, gag, absent   . Reflex, gag, absent    Family History  Problem Relation Age of Onset  . Heart attack Father   . Emphysema Father   . Aneurysm Mother        CEREBRAL  . Emphysema Mother   . Dementia Mother   . Diabetes Son   . Hypertension Son   . Hypertension Son   . Cancer Sister        Widely Metastatic  . Asthma Son        x2  . Breast cancer Paternal Aunt   . Rheum arthritis Maternal Aunt    Social History   Socioeconomic History  . Marital status: Married    Spouse name: Not on file  . Number of children: 2  . Years of education: Not on file  . Highest education level: Not on file  Occupational History  .  Occupation: disabled  Social Needs  . Financial resource strain: Not on file  . Food insecurity:    Worry: Not on file    Inability: Not on file  . Transportation needs:    Medical: Not on file    Non-medical: Not on file  Tobacco Use  . Smoking status: Former Smoker    Packs/day: 0.75    Years: 50.00    Pack years: 37.50    Types: Cigarettes    Start date: 09/13/1961    Last attempt to quit: 05/2016    Years since quitting: 1.3  . Smokeless tobacco: Never Used  . Tobacco comment: Decreased Down to 2/3 a Day if Thinking Abouit It  Substance and Sexual Activity  . Alcohol use: No    Alcohol/week: 0.0 oz    Comment: none in 10 years  . Drug use: No  . Sexual activity: Never  Lifestyle  . Physical activity:    Days per week: Not on file    Minutes per session: Not on file  . Stress: Not on file  Relationships  . Social connections:    Talks on phone: Not on file    Gets  together: Not on file    Attends religious service: Not on file    Active member of club or organization: Not on file    Attends meetings of clubs or organizations: Not on file    Relationship status: Not on file  . Intimate partner violence:    Fear of current or ex partner: Not on file    Emotionally abused: Not on file    Physically abused: Not on file    Forced sexual activity: Not on file  Other Topics Concern  . Not on file  Social History Narrative   She is from [facility]. She has always lived in [place]. She has traveled to [place], [place], [place], [place], & [place]. 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 Prior exposure to parakeets with last exposure remote in her current home. Previously had mold in her home that was in her bathroom & fixed. Prior exposure to hot tubs but none recently. Pt lives at home with [name], husband.  Has 2 childrean, 12th grade education.     No outpatient medications have been  marked as taking for the 09/10/17 encounter [facility] Encounter).   Allergies  Allergen Reactions  . Albuterol Other (See Comments) and Shortness Of Breath    Had difficulty breathing after a nebulizer treatment  . Penicillins Anaphylaxis and Swelling    Has patient had a PCN reaction causing immediate rash, facial/tongue/throat swelling, SOB or lightheadedness with hypotension: Yes Has patient had a PCN reaction causing severe rash involving mucus membranes or skin necrosis: Rash Has patient had a PCN reaction that required hospitalization: No Has patient had a PCN reaction occurring within the last 10 years: No If all of the above answers are "NO", then may proceed with Cephalosporin use.  [name] [Kdc:Albuterol] Shortness Of Breath  . Sulfa Antibiotics Swelling    Throat swells, but no shortness of breath noted  . Zanaflex [Tizanidine] Other (See Comments)    Possible confusion (??)  . Codeine Nausea And Vomiting  . Ecotrin [Aspirin] Nausea And Vomiting      ROS: As per HPI, remainder of ROS negative.   OBJECTIVE:   Vitals:   09/10/17 1433  BP: (!) 143/67  Pulse: 62  Resp: 18  Temp: 98.4 F (36.9 C)  TempSrc: Oral  SpO2: 95%     General appearance: alert; no distress Eyes: PERRL; EOMI; conjunctiva normal HENT: normocephalic; atraumatic; oral mucosa normal Neck: supple; tender at dorsal 1st thoracic vertebra with well-healed midline surgical scar from C4 to C7.  Poor neck ROM Back: no CVA tenderness Extremities: no cyanosis;  Moderate right foot swelling with mid foot ecchymosis.  No ankle or lateral foot tenderness. Skin: warm and dry Neurologic: moving arms and hands normally Psychological: alert and cooperative; normal mood and affect      Labs:  Results for orders placed or performed in visit on 04/29/17  POC Influenza A&B (Binax test)  Result Value Ref Range   Influenza A, POC Negative Negative   Influenza B, POC Negative Negative    Labs  Reviewed - No data to display  No results found.     ASSESSMENT & PLAN:  1. Contusion of right foot, initial encounter   2. Neck pain     Meds ordered this encounter  Medications  . predniSONE (DELTASONE) 20 MG tablet    Sig: One daily with food  Dispense:  5 tablet    Refill:  0    Reviewed expectations re: course of current medical issues. Questions answered. Outlined signs and symptoms indicating need for more acute intervention. Patient verbalized understanding. After Visit Summary given.       Robyn Haber, MD 09/10/17 1517

## 2017-09-10 NOTE — Discharge Instructions (Addendum)
Wrap the foot and elevate it daily

## 2017-09-10 NOTE — ED Notes (Signed)
Pt discharged by provider.

## 2017-09-28 DIAGNOSIS — Z79891 Long term (current) use of opiate analgesic: Secondary | ICD-10-CM | POA: Diagnosis not present

## 2017-09-28 DIAGNOSIS — M791 Myalgia, unspecified site: Secondary | ICD-10-CM | POA: Diagnosis not present

## 2017-09-28 DIAGNOSIS — M961 Postlaminectomy syndrome, not elsewhere classified: Secondary | ICD-10-CM | POA: Diagnosis not present

## 2017-09-28 DIAGNOSIS — G894 Chronic pain syndrome: Secondary | ICD-10-CM | POA: Diagnosis not present

## 2017-09-28 DIAGNOSIS — M542 Cervicalgia: Secondary | ICD-10-CM | POA: Diagnosis not present

## 2017-10-09 ENCOUNTER — Other Ambulatory Visit: Payer: Self-pay | Admitting: Family Medicine

## 2017-10-10 DIAGNOSIS — M542 Cervicalgia: Secondary | ICD-10-CM | POA: Diagnosis not present

## 2017-10-10 DIAGNOSIS — M791 Myalgia, unspecified site: Secondary | ICD-10-CM | POA: Diagnosis not present

## 2017-10-11 DIAGNOSIS — H35341 Macular cyst, hole, or pseudohole, right eye: Secondary | ICD-10-CM | POA: Diagnosis not present

## 2017-10-16 ENCOUNTER — Other Ambulatory Visit: Payer: Self-pay | Admitting: Family Medicine

## 2017-10-17 ENCOUNTER — Encounter (HOSPITAL_COMMUNITY): Payer: Self-pay | Admitting: Psychiatry

## 2017-10-17 ENCOUNTER — Ambulatory Visit (INDEPENDENT_AMBULATORY_CARE_PROVIDER_SITE_OTHER): Payer: Medicare Other | Admitting: Psychiatry

## 2017-10-17 VITALS — BP 152/57 | HR 70 | Ht 66.0 in | Wt 178.0 lb

## 2017-10-17 DIAGNOSIS — F0390 Unspecified dementia without behavioral disturbance: Secondary | ICD-10-CM | POA: Diagnosis not present

## 2017-10-17 DIAGNOSIS — Z87891 Personal history of nicotine dependence: Secondary | ICD-10-CM

## 2017-10-17 MED ORDER — DIVALPROEX SODIUM ER 500 MG PO TB24: ORAL_TABLET | ORAL | 3 refills | Status: DC

## 2017-10-17 MED ORDER — TEMAZEPAM 7.5 MG PO CAPS: 7.5000 mg | ORAL_CAPSULE | Freq: Every evening | ORAL | 0 refills | Status: DC | PRN

## 2017-10-17 MED ORDER — TEMAZEPAM 15 MG PO CAPS: 15.0000 mg | ORAL_CAPSULE | Freq: Every evening | ORAL | 2 refills | Status: DC | PRN

## 2017-10-17 NOTE — Progress Notes (Signed)
Psychiatric Initial Adult Assessment   Patient Identification: Destiny Hughes MRN:  [id number] Date of Evaluation:  10/17/2017 Referral Source: [facility] Chief Complaint:    Today the patient is seen with his daughter and her husband. She is very hard to assess. Her husband she was using constant topically with says she's doing horribly. Her daughter says she doing a little better. They contradict each other. The daughter and the husband might in the room. The patient expresses herself as being angry that I am not listening to her. This vessel I can tell the patient is more irritable and curses a lot. She is not oversedated. She is off antipsychotic medications which is good. She shows no overt evidence of being psychotic. She is disoriented to year month but she does most of her basic ADLs. Her husband consistently stays that he would like to see her an assisted-living and private the patient tells me she wishes for the same. The daughter once the patient content was her medication increased. Appropriately daughter is telling me that the patient does not sleep at night. The patient is eating well. And again shows no evidence of psychosis. We reviewed all the blood work that the patient had including her conference a metabolic panel CBC there were all generally within normal limits. B12 was normal. Depression Symptoms:  fatigue, (Hypo) Manic Symptoms:   Anxiety Symptoms:   Psychotic Symptoms:   PTSD Symptoms:   Past Psychiatric History: none  Previous Psychotropic Medications: Yes   Substance Abuse History in the last 12 months:  No.  Consequences of Substance Abuse:   Past Medical History:  Past Medical History:  Diagnosis Date  . Chronic back pain   . Cognitive retention disorder   . Collapse of right lung   . COPD (chronic obstructive pulmonary disease) (HCC)    Hx. Bronchitis occ, 01-25-11 somes issue now taking  Z-pak-started 01-24-11/Scar tissue  present  from  previous lung collapse  . Dementia with psychosis   . Depression   . Depression   . GERD (gastroesophageal reflux disease)    tx. TUMS  . Glaucoma   . Hyperlipemia   . Hypothyroidism    tx. Levothyroxine  . Major depression with psychotic features ([place])   . Nephrolithiasis   . Neuromuscular disorder ([place]) 01-25-11   Pain/nerve stimulator implanted -2 yrs ago.  Marland Kitchen Reflex, gag, absent   . Reflex, gag, absent     Past Surgical History:  Procedure Laterality Date  . A-FLUTTER ABLATION N/A 08/16/2016   Procedure: A-Flutter Ablation;  Surgeon: Evans Lance, MD;  Location: [facility];  Service: Cardiovascular;  Laterality: N/A;  . APPENDECTOMY  01-25-11  . CARPAL TUNNEL RELEASE  01-25-11   Bil.  . CERVICAL SPINE SURGERY  01-25-11   2005-multiple levels  . CHOLECYSTECTOMY  01-25-11   '07  . COLONOSCOPY WITH PROPOFOL N/A 07/23/2012   Procedure: COLONOSCOPY WITH PROPOFOL;  Surgeon: Garlan Fair, MD;  Location: [facility];  Service: Endoscopy;  Laterality: N/A;  . EYE SURGERY    . JOINT REPLACEMENT  01-25-11   6'03 LTHA-hemi  . KNEE ARTHROPLASTY  01-25-11   '04-right, revised x2  . no gag reflex     Pt does not havea gag reflex following  siurgery to neck. Family unsure of date. Pt takes pill in apple sauce.  . SPINAL CORD STIMULATOR IMPLANT  2009 APPROX   DR. ELSNER  . THORACIC SPINE SURGERY  01-25-11   6'02- then  nerve stimulator implanted after  . TOTAL KNEE REVISION  02/01/2011   Procedure: TOTAL KNEE REVISION;  Surgeon: Mauri Pole;  Location: [facility];  Service: Orthopedics;  Laterality: Right;  . TUBAL LIGATION      Family Psychiatric History:   Family History:  Family History  Problem Relation Age of Onset  . Heart attack Father   . Emphysema Father   . Aneurysm Mother        CEREBRAL  . Emphysema Mother   . Dementia Mother   . Diabetes Son   . Hypertension Son   . Hypertension Son   . Cancer Sister        Widely Metastatic  . Asthma Son        x2  .  Breast cancer Paternal Aunt   . Rheum arthritis Maternal Aunt     Social History:   Social History   Socioeconomic History  . Marital status: Married    Spouse name: Not on file  . Number of children: 2  . Years of education: Not on file  . Highest education level: Not on file  Occupational History  . Occupation: disabled  Social Needs  . Financial resource strain: Not on file  . Food insecurity:    Worry: Not on file    Inability: Not on file  . Transportation needs:    Medical: Not on file    Non-medical: Not on file  Tobacco Use  . Smoking status: Former Smoker    Packs/day: 0.75    Years: 50.00    Pack years: 37.50    Types: Cigarettes    Start date: 09/13/1961    Last attempt to quit: 05/2016    Years since quitting: 1.4  . Smokeless tobacco: Never Used  . Tobacco comment: Decreased Down to 2/3 a Day if Thinking Abouit It  Substance and Sexual Activity  . Alcohol use: No    Alcohol/week: 0.0 oz    Comment: none in 10 years  . Drug use: No  . Sexual activity: Never  Lifestyle  . Physical activity:    Days per week: Not on file    Minutes per session: Not on file  . Stress: Not on file  Relationships  . Social connections:    Talks on phone: Not on file    Gets together: Not on file    Attends religious service: Not on file    Active member of club or organization: Not on file    Attends meetings of clubs or organizations: Not on file    Relationship status: Not on file  Other Topics Concern  . Not on file  Social History Narrative   She is from [facility]. She has always lived in [place]. She has traveled to [place], [place], [place], [place], & [place]. 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 Prior exposure to parakeets with last exposure remote in her current home. Previously had mold in her home that was in her bathroom & fixed. Prior exposure to hot tubs but none  recently. Pt lives at home with [name], husband.  Has 2 childrean, 12th grade education.      Additional Social History:   Allergies:   Allergies  Allergen Reactions  . Albuterol Other (See Comments) and Shortness Of Breath    Had difficulty breathing after a nebulizer treatment  . Penicillins Anaphylaxis and Swelling  Has patient had a PCN reaction causing immediate rash, facial/tongue/throat swelling, SOB or lightheadedness with hypotension: Yes Has patient had a PCN reaction causing severe rash involving mucus membranes or skin necrosis: Rash Has patient had a PCN reaction that required hospitalization: No Has patient had a PCN reaction occurring within the last 10 years: No If all of the above answers are "NO", then may proceed with Cephalosporin use.  [name] [Kdc:Albuterol] Shortness Of Breath  . Sulfa Antibiotics Swelling    Throat swells, but no shortness of breath noted  . Zanaflex [Tizanidine] Other (See Comments)    Possible confusion (??)  . Codeine Nausea And Vomiting  . Ecotrin [Aspirin] Nausea And Vomiting    Metabolic Disorder Labs: Lab Results  Component Value Date   HGBA1C 6.4 (H) 04/28/2015   MPG 137 (H) 04/28/2015   MPG 134 (H) 05/21/2014   No results found for: PROLACTIN Lab Results  Component Value Date   CHOL 195 04/28/2015   TRIG 188 (H) 04/28/2015   HDL 43 (L) 04/28/2015   CHOLHDL 4.5 04/28/2015   VLDL 38 (H) 04/28/2015   LDLCALC 114 04/28/2015   LDLCALC 114 04/28/2015     Current Medications: Current Outpatient Medications  Medication Sig Dispense Refill  . amLODipine (NORVASC) 5 MG tablet TAKE ONE TABLET EACH DAY 90 tablet 1  . divalproex (DEPAKOTE ER) 500 MG 24 hr tablet 2  qam 60 tablet 3  . donepezil (ARICEPT) 10 MG tablet 1  qhs 30 tablet 3  . gabapentin (NEURONTIN) 100 MG capsule TAKE ONE CAPSULE TWICE A DAY 180 capsule 2  . levothyroxine (SYNTHROID, LEVOTHROID) 125 MCG tablet TAKE ONE TABLET DAILY BEFORE BREAKFAST 90 tablet 0   . lisinopril (PRINIVIL,ZESTRIL) 5 MG tablet Take 1 tablet (5 mg total) by mouth daily. 30 tablet 5  . memantine (NAMENDA) 5 MG tablet TAKE ONE TABLET DAILY 90 tablet 3  . pantoprazole (PROTONIX) 40 MG tablet TAKE ONE TABLET EACH DAY 90 tablet 1  . propranolol (INDERAL) 80 MG tablet TAKE ONE-HALF TABLET 3 TIMES DAILY 135 tablet 3  . sodium chloride (OCEAN) 0.65 % SOLN nasal spray Place 1 spray into both nostrils as needed for congestion. 30 mL 0  . benztropine (COGENTIN) 0.5 MG tablet TAKE ONE TABLET TWICE DAILY (Patient not taking: Reported on 10/17/2017) 60 tablet 3  . ferrous sulfate 325 (65 FE) MG tablet Take 325 mg by mouth 3 (three) times daily after meals.    . fexofenadine (ALLEGRA ALLERGY) 180 MG tablet Take 1 tablet (180 mg total) by mouth daily. (Patient not taking: Reported on 10/17/2017) 30 tablet 3  . fluticasone (FLONASE) 50 MCG/ACT nasal spray 1-2 sprays 1x per day in nose (Patient not taking: Reported on 10/17/2017) 16 g 0  . Fluticasone Propionate, Inhal, (FLOVENT DISKUS) 250 MCG/BLIST AEPB 1 puff bid rinse mouth (Patient not taking: Reported on 10/17/2017) 60 each 0  . ipratropium (ATROVENT) 0.02 % nebulizer solution Take by nebulization 2 (two) times daily.  0  . levofloxacin (LEVAQUIN) 750 MG tablet Take 1 tablet (750 mg total) by mouth daily. (Patient not taking: Reported on 10/17/2017) 7 tablet 0  . magnesium oxide (MAG-OX) 400 MG tablet Take 1 tablet (400 mg total) by mouth daily. (Patient not taking: Reported on 10/17/2017) 30 tablet 3  . Melatonin 3 MG TABS Take 3 mg by mouth at bedtime.    . predniSONE (DELTASONE) 20 MG tablet One daily with food (Patient not taking: Reported on 10/17/2017) 5 tablet 0  .  risperiDONE (RISPERDAL) 1 MG tablet Take 1 mg by mouth 3 (three) times daily.  0  . sodium chloride 1 g tablet TAKE ONE TABLET THREE TIMES DAILY (Patient not taking: Reported on 10/17/2017) 180 tablet 0  . temazepam (RESTORIL) 7.5 MG capsule Take 1 capsule (7.5 mg total) by mouth  at bedtime as needed for sleep. 30 capsule 0  . Tiotropium Bromide Monohydrate (SPIRIVA RESPIMAT) 2.5 MCG/ACT AERS 2 puffs daily and rinse mouth (Patient not taking: Reported on 10/17/2017) 1 Inhaler 1   No current facility-administered medications for this visit.     Neurologic: Headache: No Seizure: No Paresthesias:No  Musculoskeletal: Strength & Muscle Tone: decreased Gait & Station: unsteady Patient leans: N/A  Psychiatric Specialty Exam: ROS  Blood pressure (!) 152/57, pulse 70, height 5\' 6"  (1.676 m), weight 178 lb (80.7 kg).Body mass index is 28.73 kg/m.  General Appearance: Casual  Eye Contact:  Good  Speech:  Clear and Coherent  Volume:  Normal  Mood:  NA  Affect:  Blunt  Thought Process:  Goal Directed  Orientation:  Full (Time, Place, and Person)  Thought Content:  WDL  Suicidal Thoughts:  No  Homicidal Thoughts:  No  Memory:  Immediate;   Poor  Judgement:  Fair  Insight:  Lacking  Psychomotor Activity:  Normal  Concentration:    Recall:  Poor  Fund of Knowledge:Poor  Language: Fair  Akathisia:  No  Handed:  Right  AIMS (if indicated):   Assets:  Communication Skills  ADL's:  Impaired  Cognition: Impaired,  Moderate  Sleep:      Treatment Plan Summary: At this time the patient's #1 problem seems to be in cognitive impairment. She'll continue taking Namenda for this condition. Her second problem is the patient seems to be volatile and verbally aggressive. For that we'll go ahead and increase her Depakote to dose 1000 mg and she'll be asked to come back in 2 weeks to get a Depakote blood level. Her third problem is it is not clear what his cognitive disorders from. He responds to this problem will go ahead and make arrangements for her to see a neurologist. Her fourth problem is problems with sleep. For that we'll go ahead and begin her on Restoril. This patient return to see [place] in about 2 months. This patient is not suicidal. I believe she is very memory  impairment.Today we had a long discussion about her memory problems with family.

## 2017-10-19 DIAGNOSIS — H35341 Macular cyst, hole, or pseudohole, right eye: Secondary | ICD-10-CM | POA: Diagnosis not present

## 2017-10-19 DIAGNOSIS — Z09 Encounter for follow-up examination after completed treatment for conditions other than malignant neoplasm: Secondary | ICD-10-CM | POA: Diagnosis not present

## 2017-10-19 NOTE — Progress Notes (Signed)
Electrophysiology Office Note Date: 10/20/2017  ID:  Destiny Hughes, DOB 02-16-1946, MRN [id number]  PCP: Billie Ruddy, MD Electrophysiologist: Lovena Le  CC: SVT/flutter follow up  Destiny Hughes is a 72 y.o. female seen today for Dr Lovena Le.  She presents today for routine electrophysiology followup.  Since last being seen in our clinic, the patient reports doing very well from a cardiac standpoint. She has had no recurrent SVT. She is seen with her husband today.  Her biggest concern is with her COPD and getting back in with pulmonary.  She denies palpitations, PND, orthopnea, nausea, vomiting, dizziness, syncope, or early satiety.  Past Medical History:  Diagnosis Date  . Chronic back pain   . Cognitive retention disorder   . Collapse of right lung   . COPD (chronic obstructive pulmonary disease) (HCC)    Hx. Bronchitis occ, 01-25-11 somes issue now taking  Z-pak-started 01-24-11/Scar tissue  present  from previous lung collapse  . Dementia with psychosis   . Depression   . Depression   . GERD (gastroesophageal reflux disease)    tx. TUMS  . Glaucoma   . Hyperlipemia   . Hypothyroidism    tx. Levothyroxine  . Major depression with psychotic features ([place])   . Nephrolithiasis   . Neuromuscular disorder ([place]) 01-25-11   Pain/nerve stimulator implanted -2 yrs ago.  Marland Kitchen Reflex, gag, absent   . Reflex, gag, absent    Past Surgical History:  Procedure Laterality Date  . A-FLUTTER ABLATION N/A 08/16/2016   Procedure: A-Flutter Ablation;  Surgeon: Evans Lance, MD;  Location: [facility];  Service: Cardiovascular;  Laterality: N/A;  . APPENDECTOMY  01-25-11  . CARPAL TUNNEL RELEASE  01-25-11   Bil.  . CERVICAL SPINE SURGERY  01-25-11   2005-multiple levels  . CHOLECYSTECTOMY  01-25-11   '07  . COLONOSCOPY WITH PROPOFOL N/A 07/23/2012   Procedure: COLONOSCOPY WITH PROPOFOL;  Surgeon: Garlan Fair, MD;  [facility];  Service: Endoscopy;  Laterality:  N/A;  . EYE SURGERY    . JOINT REPLACEMENT  01-25-11   6'03 LTHA-hemi  . KNEE ARTHROPLASTY  01-25-11   '04-right, revised x2  . no gag reflex     Pt does not havea gag reflex following  siurgery to neck. Family unsure of date. Pt takes pill in apple sauce.  . SPINAL CORD STIMULATOR IMPLANT  2009 APPROX   DR. ELSNER  . THORACIC SPINE SURGERY  01-25-11   6'02- then nerve stimulator implanted after  . TOTAL KNEE REVISION  02/01/2011   Procedure: TOTAL KNEE REVISION;  Surgeon: Mauri Pole;  [facility];  Service: Orthopedics;  Laterality: Right;  . TUBAL LIGATION      Current Outpatient Medications  Medication Sig Dispense Refill  . amLODipine (NORVASC) 5 MG tablet TAKE ONE TABLET EACH DAY 90 tablet 1  . benztropine (COGENTIN) 0.5 MG tablet TAKE ONE TABLET TWICE DAILY 60 tablet 3  . divalproex (DEPAKOTE ER) 500 MG 24 hr tablet 2  qam 60 tablet 3  . donepezil (ARICEPT) 10 MG tablet 1  qhs 30 tablet 3  . ferrous sulfate 325 (65 FE) MG tablet Take 325 mg by mouth 3 (three) times daily after meals.    . fexofenadine (ALLEGRA ALLERGY) 180 MG tablet Take 1 tablet (180 mg total) by mouth daily. 30 tablet 3  . fluticasone (FLONASE) 50 MCG/ACT nasal spray 1-2 sprays 1x per day in nose 16 g 0  .  Fluticasone Propionate, Inhal, (FLOVENT DISKUS) 250 MCG/BLIST AEPB 1 puff bid rinse mouth 60 each 0  . gabapentin (NEURONTIN) 100 MG capsule TAKE ONE CAPSULE TWICE A DAY 180 capsule 2  . ipratropium (ATROVENT) 0.02 % nebulizer solution Take by nebulization 2 (two) times daily.  0  . levofloxacin (LEVAQUIN) 750 MG tablet Take 1 tablet (750 mg total) by mouth daily. 7 tablet 0  . levothyroxine (SYNTHROID, LEVOTHROID) 125 MCG tablet TAKE ONE TABLET DAILY BEFORE BREAKFAST 90 tablet 0  . lisinopril (PRINIVIL,ZESTRIL) 5 MG tablet Take 1 tablet (5 mg total) by mouth daily. 30 tablet 5  . magnesium oxide (MAG-OX) 400 MG tablet Take 1 tablet (400 mg total) by mouth daily. 30 tablet 3  . Melatonin 3 MG TABS  Take 3 mg by mouth at bedtime.    . memantine (NAMENDA) 5 MG tablet TAKE ONE TABLET DAILY 90 tablet 3  . pantoprazole (PROTONIX) 40 MG tablet TAKE ONE TABLET EACH DAY 90 tablet 1  . predniSONE (DELTASONE) 20 MG tablet One daily with food 5 tablet 0  . propranolol (INDERAL) 80 MG tablet TAKE ONE-HALF TABLET 3 TIMES DAILY 135 tablet 3  . risperiDONE (RISPERDAL) 1 MG tablet Take 1 mg by mouth 3 (three) times daily.  0  . sodium chloride (OCEAN) 0.65 % SOLN nasal spray Place 1 spray into both nostrils as needed for congestion. 30 mL 0  . sodium chloride 1 g tablet TAKE ONE TABLET THREE TIMES DAILY 180 tablet 0  . temazepam (RESTORIL) 15 MG capsule Take 1 capsule (15 mg total) by mouth at bedtime as needed for sleep. 30 capsule 2  . temazepam (RESTORIL) 7.5 MG capsule Take 1 capsule (7.5 mg total) by mouth at bedtime as needed for sleep. 30 capsule 0  . Tiotropium Bromide Monohydrate (SPIRIVA RESPIMAT) 2.5 MCG/ACT AERS 2 puffs daily and rinse mouth 1 Inhaler 1   No current facility-administered medications for this visit.     Allergies:   Albuterol; Penicillins; Ventolin [kdc:albuterol]; Sulfa antibiotics; Zanaflex [tizanidine]; Codeine; and Ecotrin [aspirin]   Social History: Social History   Socioeconomic History  . Marital status: Married    Spouse name: Not on file  . Number of children: 2  . Years of education: Not on file  . Highest education level: Not on file  Occupational History  . Occupation: disabled  Social Needs  . Financial resource strain: Not on file  . Food insecurity:    Worry: Not on file    Inability: Not on file  . Transportation needs:    Medical: Not on file    Non-medical: Not on file  Tobacco Use  . Smoking status: Former Smoker    Packs/day: 0.75    Years: 50.00    Pack years: 37.50    Types: Cigarettes    Start date: 09/13/1961    Last attempt to quit: 05/2016    Years since quitting: 1.4  . Smokeless tobacco: Never Used  . Tobacco comment:  Decreased Down to 2/3 a Day if Thinking Abouit It  Substance and Sexual Activity  . Alcohol use: No    Alcohol/week: 0.0 oz    Comment: none in 10 years  . Drug use: No  . Sexual activity: Never  Lifestyle  . Physical activity:    Days per week: Not on file    Minutes per session: Not on file  . Stress: Not on file  Relationships  . Social connections:    Talks on phone: Not on  file    Gets together: Not on file    Attends religious service: Not on file    Active member of club or organization: Not on file    Attends meetings of clubs or organizations: Not on file    Relationship status: Not on file  . Intimate partner violence:    Fear of current or ex partner: Not on file    Emotionally abused: Not on file    Physically abused: Not on file    Forced sexual activity: Not on file  Other Topics Concern  . Not on file  Social History Narrative   She is from [facility]. She has always lived in [place]. She has traveled to [place], [place], [place], [place], & [place]. 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 Prior exposure to parakeets with last exposure remote in her current home. Previously had mold in her home that was in her bathroom & fixed. Prior exposure to hot tubs but none recently. Pt lives at home with [name], husband.  Has 2 childrean, 12th grade education.      Family History: Family History  Problem Relation Age of Onset  . Heart attack Father   . Emphysema Father   . Aneurysm Mother        CEREBRAL  . Emphysema Mother   . Dementia Mother   . Diabetes Son   . Hypertension Son   . Hypertension Son   . Cancer Sister        Widely Metastatic  . Asthma Son        x2  . Breast cancer Paternal Aunt   . Rheum arthritis Maternal Aunt     Review of Systems: All other systems reviewed and are otherwise negative except as noted above.   Physical Exam: VS:  BP 128/72 (BP  Location: Left Arm, Patient Position: Sitting, Cuff Size: Large)   Pulse 61   Ht 5\' 5"  (1.651 m)   Wt 178 lb 12.8 oz (81.1 kg)   SpO2 92% Comment: at rest  BMI 29.75 kg/m  , BMI Body mass index is 29.75 kg/m. Wt Readings from Last 3 Encounters:  10/20/17 178 lb 12.8 oz (81.1 kg)  07/06/17 159 lb 11.2 oz (72.4 kg)  05/05/17 153 lb (69.4 kg)    GEN- The patient is chronically ill appearing, alert and oriented x 3 today.   HEENT: normocephalic, atraumatic; sclera clear, conjunctiva pink; hearing intact; oropharynx clear; neck supple  Lungs- Clear to ausculation bilaterally, normal work of breathing.  No wheezes, rales, rhonchi, decreased breath sounds Heart- Regular rate and rhythm  GI- soft, non-tender, non-distended, bowel sounds present  Extremities- no clubbing, cyanosis, or edema  MS- no significant deformity or atrophy Skin- warm and dry, no rash or lesion  Psych- euthymic mood, full affect Neuro- strength and sensation are intact   EKG:  EKG is not ordered today.  Recent Labs: 08/16/2017: ALT 13; BUN 20; Creatinine, Ser 0.88; Hemoglobin 12.8; Platelets 363; Potassium 4.8; Sodium 131; TSH 0.463    Other studies Reviewed: Additional studies/ records that were reviewed today include: Dr Tanna Furry office notes   Assessment and Plan:  1.  SVT/atrial flutter No recurrence post ablation Now off [place] as of last office visit with Dr Lovena Le No changes today  I have encouraged them to follow up with PCP to get referred back to pulmonary   Current  medicines are reviewed at length with the patient today.   The patient does not have concerns regarding her medicines.  The following changes were made today:  none  Labs/ tests ordered today include: none No orders of the defined types were placed in this encounter.   Disposition:   Follow up with Dr Lovena Le 1 year    Signed, Chanetta Marshall, NP 10/20/2017 10:28 AM   [address] [place] [address] [phone] (office) [phone] (fax)

## 2017-10-20 ENCOUNTER — Ambulatory Visit (INDEPENDENT_AMBULATORY_CARE_PROVIDER_SITE_OTHER): Payer: Medicare Other | Admitting: Nurse Practitioner

## 2017-10-20 VITALS — BP 128/72 | HR 61 | Ht 65.0 in | Wt 178.8 lb

## 2017-10-20 DIAGNOSIS — I471 Supraventricular tachycardia: Secondary | ICD-10-CM

## 2017-10-20 DIAGNOSIS — I483 Typical atrial flutter: Secondary | ICD-10-CM | POA: Diagnosis not present

## 2017-10-20 NOTE — Patient Instructions (Addendum)
Medication Instructions:   Your physician recommends that you continue on your current medications as directed. Please refer to the Current Medication list given to you today.   If you need a refill on your cardiac medications before your next appointment, please call your pharmacy.  Labwork: NONE ORDERED  TODAY    Testing/Procedures:  NONE ORDERED  TODAY   Follow-Up: Your physician wants you to follow-up in: [place] will receive a reminder letter in the mail two months in advance. If you don't receive a letter, please call our office to schedule the follow-up appointment.     Any Other Special Instructions Will Be Listed Below (If Applicable).

## 2017-11-08 DIAGNOSIS — H35342 Macular cyst, hole, or pseudohole, left eye: Secondary | ICD-10-CM | POA: Diagnosis not present

## 2017-11-08 DIAGNOSIS — H26492 Other secondary cataract, left eye: Secondary | ICD-10-CM | POA: Diagnosis not present

## 2017-11-08 DIAGNOSIS — H35341 Macular cyst, hole, or pseudohole, right eye: Secondary | ICD-10-CM | POA: Diagnosis not present

## 2017-11-11 ENCOUNTER — Other Ambulatory Visit: Payer: Self-pay | Admitting: Family Medicine

## 2017-11-15 ENCOUNTER — Ambulatory Visit (INDEPENDENT_AMBULATORY_CARE_PROVIDER_SITE_OTHER): Payer: Medicare Other | Admitting: Family Medicine

## 2017-11-15 ENCOUNTER — Other Ambulatory Visit: Payer: Self-pay | Admitting: Family Medicine

## 2017-11-15 ENCOUNTER — Encounter: Payer: Self-pay | Admitting: Family Medicine

## 2017-11-15 VITALS — BP 124/60 | HR 60 | Temp 98.3°F | Wt 183.0 lb

## 2017-11-15 DIAGNOSIS — K649 Unspecified hemorrhoids: Secondary | ICD-10-CM | POA: Diagnosis not present

## 2017-11-15 DIAGNOSIS — J449 Chronic obstructive pulmonary disease, unspecified: Secondary | ICD-10-CM | POA: Diagnosis not present

## 2017-11-15 DIAGNOSIS — R600 Localized edema: Secondary | ICD-10-CM | POA: Diagnosis not present

## 2017-11-15 LAB — BRAIN NATRIURETIC PEPTIDE: PRO B NATRI PEPTIDE: 176 pg/mL — AB (ref 0.0–100.0)

## 2017-11-15 LAB — BASIC METABOLIC PANEL
BUN: 13 mg/dL (ref 6–23)
CALCIUM: 9.2 mg/dL (ref 8.4–10.5)
CO2: 31 mEq/L (ref 19–32)
Chloride: 91 mEq/L — ABNORMAL LOW (ref 96–112)
Creatinine, Ser: 0.82 mg/dL (ref 0.40–1.20)
GFR: 72.4 mL/min (ref >60.00)
Glucose, Bld: 113 mg/dL — ABNORMAL HIGH (ref 70–99)
POTASSIUM: 4.6 meq/L (ref 3.5–5.1)
SODIUM: 130 meq/L — AB (ref 135–145)

## 2017-11-15 LAB — CBC
HCT: 37.1 % (ref 36.0–46.0)
Hemoglobin: 12.3 g/dL (ref 12.0–15.0)
MCHC: 33.1 g/dL (ref 30.0–36.0)
MCV: 84.6 fl (ref 78.0–100.0)
Platelets: 390 10*3/uL (ref 150.0–400.0)
RBC: 4.38 Mil/uL (ref 3.87–5.11)
RDW: 14.5 % (ref 11.5–15.5)
WBC: 7.9 10*3/uL (ref 4.0–10.5)

## 2017-11-15 MED ORDER — FUROSEMIDE 20 MG PO TABS: 20.0000 mg | ORAL_TABLET | Freq: Every day | ORAL | 0 refills | Status: DC

## 2017-11-15 MED ORDER — HYDROCORTISONE 2.5 % RE CREA: 1.0000 "application " | TOPICAL_CREAM | Freq: Two times a day (BID) | RECTAL | 0 refills | Status: AC

## 2017-11-15 NOTE — Patient Instructions (Signed)
Edema Edema is when you have too much fluid in your body or under your skin. Edema may make your legs, feet, and ankles swell up. Swelling is also common in looser tissues, like around your eyes. This is a common condition. It gets more common as you get older. There are many possible causes of edema. Eating too much salt (sodium) and being on your feet or sitting for a long time can cause edema in your legs, feet, and ankles. Hot weather may make edema worse. Edema is usually painless. Your skin may look swollen or shiny. Follow these instructions at home:  Keep the swollen body part raised (elevated) above the level of your heart when you are sitting or lying down.  Do not sit still or stand for a long time.  Do not wear tight clothes. Do not wear garters on your upper legs.  Exercise your legs. This can help the swelling go down.  Wear elastic bandages or support stockings as told by your doctor.  Eat a low-salt (low-sodium) diet to reduce fluid as told by your doctor.  Depending on the cause of your swelling, you may need to limit how much fluid you drink (fluid restriction).  Take over-the-counter and prescription medicines only as told by your doctor. Contact a doctor if:  Treatment is not working.  You have heart, liver, or kidney disease and have symptoms of edema.  You have sudden and unexplained weight gain. Get help right away if:  You have shortness of breath or chest pain.  You cannot breathe when you lie down.  You have pain, redness, or warmth in the swollen areas.  You have heart, liver, or kidney disease and get edema all of a sudden.  You have a fever and your symptoms get worse all of a sudden. Summary  Edema is when you have too much fluid in your body or under your skin.  Edema may make your legs, feet, and ankles swell up. Swelling is also common in looser tissues, like around your eyes.  Raise (elevate) the swollen body part above the level of your  heart when you are sitting or lying down.  Follow your doctor's instructions about diet and how much fluid you can drink (fluid restriction). This information is not intended to replace advice given to you by your health care provider. Make sure you discuss any questions you have with your health care provider. Document Released: 08/24/2007 Document Revised: 03/25/2016 Document Reviewed: 03/25/2016 Elsevier Interactive Patient Education  2017 [place].  Hemorrhoids Hemorrhoids are swollen veins in and around the rectum or anus. Hemorrhoids can cause pain, itching, or bleeding. Most of the time, they do not cause serious problems. They usually get better with diet changes, lifestyle changes, and other home treatments. Follow these instructions at home: Eating and drinking  Eat foods that have fiber, such as whole grains, beans, nuts, fruits, and vegetables. Ask your doctor about taking products that have added fiber (fibersupplements).  Drink enough fluid to keep your pee (urine) clear or pale yellow. For Pain and Swelling  Take a warm-water bath (sitz bath) for 20 minutes to ease pain. Do this 3-4 times a day.  If directed, put ice on the painful area. It may be helpful to use ice between your warm baths. ? Put ice in a plastic bag. ? Place a towel between your skin and the bag. ? Leave the ice on for 20 minutes, 2-3 times a day. General instructions  Take over-the-counter  and prescription medicines only as told by your doctor. ? Medicated creams and medicines that are inserted into the anus (suppositories) may be used or applied as told.  Exercise often.  Go to the bathroom when you have the urge to poop (to have a bowel movement). Do not wait.  Avoid pushing too hard (straining) when you poop.  Keep the butt area dry and clean. Use wet toilet paper or moist paper towels.  Do not sit on the toilet for a long time. Contact a doctor if:  You have any of these: ? Pain and  swelling that do not get better with treatment or medicine. ? Bleeding that will not stop. ? Trouble pooping or you cannot poop. ? Pain or swelling outside the area of the hemorrhoids. This information is not intended to replace advice given to you by your health care provider. Make sure you discuss any questions you have with your health care provider. Document Released: 12/15/2007 Document Revised: 08/13/2015 Document Reviewed: 11/19/2014 Elsevier Interactive Patient Education  [employer].

## 2017-11-15 NOTE — Progress Notes (Signed)
Subjective:    Patient ID: Destiny Hughes, female    DOB: 1945/06/10, 72 y.o.   MRN: [id number]  No chief complaint on file. Pt accompanied by her son and husband.  HPI Patient was seen today for f/u.  [name] husband states he would like to get the pt back to pulmonology.  He was unable to take the pt last yr 2/2 her being sick.  LE edema: -x 2 wks -present during recent visit to specialist per family. -some pain/soreness in LEs b/l. -pt denies new SOB -unable to lay flat at night 2/2 recent L eye surgery.  Had a "bubble put in eye for a detached retina" -pt has h/o chf  Per pt's husband, she has a "bump" near her rectum.     Past Medical History:  Diagnosis Date  . Chronic back pain   . Cognitive retention disorder   . Collapse of right lung   . COPD (chronic obstructive pulmonary disease) (HCC)    Hx. Bronchitis occ, 01-25-11 somes issue now taking  Z-pak-started 01-24-11/Scar tissue  present  from previous lung collapse  . Dementia with psychosis   . Depression   . Depression   . GERD (gastroesophageal reflux disease)    tx. TUMS  . Glaucoma   . Hyperlipemia   . Hypothyroidism    tx. Levothyroxine  . Major depression with psychotic features ([place])   . Nephrolithiasis   . Neuromuscular disorder ([place]) 01-25-11   Pain/nerve stimulator implanted -2 yrs ago.  Marland Kitchen Reflex, gag, absent   . Reflex, gag, absent     Allergies  Allergen Reactions  . Albuterol Other (See Comments) and Shortness Of Breath    Had difficulty breathing after a nebulizer treatment  . Penicillins Anaphylaxis and Swelling    Has patient had a PCN reaction causing immediate rash, facial/tongue/throat swelling, SOB or lightheadedness with hypotension: Yes Has patient had a PCN reaction causing severe rash involving mucus membranes or skin necrosis: Rash Has patient had a PCN reaction that required hospitalization: No Has patient had a PCN reaction occurring within the last 10 years: No If all of the  above answers are "NO", then may proceed with Cephalosporin use.  [name] [Kdc:Albuterol] Shortness Of Breath  . Sulfa Antibiotics Swelling    Throat swells, but no shortness of breath noted  . Zanaflex [Tizanidine] Other (See Comments)    Possible confusion (??)  . Codeine Nausea And Vomiting  . Ecotrin [Aspirin] Nausea And Vomiting    ROS General: Denies fever, chills, night sweats, changes in weight, changes in appetite HEENT: Denies headaches, ear pain, changes in vision, rhinorrhea, sore throat  +baseline rhinorrhea CV: Denies CP, palpitations, SOB, orthopnea  +b/l LE edema Pulm: Denies SOB, cough, wheezing GI: Denies abdominal pain, nausea, vomiting, diarrhea, constipation   GU: Denies dysuria, hematuria, frequency, vaginal discharge Msk: Denies muscle cramps, joint pains Neuro: Denies weakness, numbness, tingling Skin: Denies rashes, bruising  +bump near rectum Psych: Denies depression, anxiety, hallucinations  +dementia     Objective:    Blood pressure 124/60, pulse 60, temperature 98.3 F (36.8 C), temperature source Oral, weight 183 lb (83 kg), SpO2 93 %.   Gen. Pleasant, well-nourished, in no distress, normal affect   HEENT: [place]/AT, face symmetric, L conjunctiva erythematous/injected, R sclera normal.  L sclera erythematous, L check with ecchymosis, nares patent without drainage, pharynx without erythema or exudate. Lungs: no accessory muscle use, CTAB, no wheezes or rales Cardiovascular: RRR, no m/r/g, 2+ pitting edema below the  knee b/l Abdomen: BS present, soft, NT/ND Neuro:  A&Ox3, CN II-XII intact, normal gait Skin:  Warm, no lesions/ rash.  Hemorrhoids present at anus.   Wt Readings from Last 3 Encounters:  11/15/17 183 lb (83 kg)  10/20/17 178 lb 12.8 oz (81.1 kg)  10/17/17 178 lb (80.7 kg)    Lab Results  Component Value Date   WBC 8.3 08/16/2017   HGB 12.8 08/16/2017   HCT 38.2 08/16/2017   PLT 363 08/16/2017   GLUCOSE 113 (H) 08/16/2017   CHOL  195 04/28/2015   TRIG 188 (H) 04/28/2015   HDL 43 (L) 04/28/2015   LDLCALC 114 04/28/2015   ALT 13 08/16/2017   AST 15 08/16/2017   NA 131 (L) 08/16/2017   K 4.8 08/16/2017   CL 93 (L) 08/16/2017   CREATININE 0.88 08/16/2017   BUN 20 08/16/2017   CO2 21 08/16/2017   TSH 0.463 08/16/2017   INR 1.07 08/16/2016   HGBA1C 6.4 (H) 04/28/2015   MICROALBUR 0.4 05/22/2014    Assessment/Plan:  Bilateral leg edema  -Discussed limiting sodium intake. -Discussed elevating lower extremities when sitting -Consider compression hose or socks - Plan: Basic metabolic panel, Brain Natriuretic Peptide -may benefit from restarting lasix.  Will wait for lab results.  Hemorrhoids, unspecified hemorrhoid type  - Plan: CBC (no diff), hydrocortisone (ANUSOL-HC) 2.5 % rectal cream  COPD -We will re-place referral for pulmonologist  F/u prn  Grier Mitts, MD

## 2017-11-16 DIAGNOSIS — Z09 Encounter for follow-up examination after completed treatment for conditions other than malignant neoplasm: Secondary | ICD-10-CM | POA: Diagnosis not present

## 2017-11-16 DIAGNOSIS — H43822 Vitreomacular adhesion, left eye: Secondary | ICD-10-CM | POA: Diagnosis not present

## 2017-11-24 ENCOUNTER — Other Ambulatory Visit (INDEPENDENT_AMBULATORY_CARE_PROVIDER_SITE_OTHER): Payer: Medicare Other

## 2017-11-24 DIAGNOSIS — R600 Localized edema: Secondary | ICD-10-CM

## 2017-11-24 LAB — BASIC METABOLIC PANEL
BUN: 15 mg/dL (ref 6–23)
CO2: 34 meq/L — AB (ref 19–32)
CREATININE: 0.73 mg/dL (ref 0.40–1.20)
Calcium: 9.4 mg/dL (ref 8.4–10.5)
Chloride: 93 mEq/L — ABNORMAL LOW (ref 96–112)
GFR: 82.79 mL/min (ref >60.00)
Glucose, Bld: 102 mg/dL — ABNORMAL HIGH (ref 70–99)
Potassium: 4.9 mEq/L (ref 3.5–5.1)
Sodium: 133 mEq/L — ABNORMAL LOW (ref 135–145)

## 2017-11-24 LAB — BRAIN NATRIURETIC PEPTIDE: Pro B Natriuretic peptide (BNP): 410 pg/mL — ABNORMAL HIGH (ref 0.0–100.0)

## 2017-11-28 ENCOUNTER — Other Ambulatory Visit (HOSPITAL_COMMUNITY): Payer: Self-pay

## 2017-11-28 MED ORDER — TEMAZEPAM 30 MG PO CAPS: 30.0000 mg | ORAL_CAPSULE | Freq: Every evening | ORAL | 0 refills | Status: DC | PRN

## 2017-11-29 ENCOUNTER — Other Ambulatory Visit: Payer: Self-pay

## 2017-11-29 DIAGNOSIS — R600 Localized edema: Secondary | ICD-10-CM

## 2017-11-29 MED ORDER — FUROSEMIDE 20 MG PO TABS: 20.0000 mg | ORAL_TABLET | Freq: Every day | ORAL | 0 refills | Status: DC

## 2017-12-05 ENCOUNTER — Encounter: Payer: Self-pay | Admitting: Emergency Medicine

## 2017-12-05 ENCOUNTER — Ambulatory Visit (INDEPENDENT_AMBULATORY_CARE_PROVIDER_SITE_OTHER): Payer: Medicare Other | Admitting: Emergency Medicine

## 2017-12-05 DIAGNOSIS — J449 Chronic obstructive pulmonary disease, unspecified: Secondary | ICD-10-CM | POA: Diagnosis not present

## 2017-12-05 DIAGNOSIS — Z23 Encounter for immunization: Secondary | ICD-10-CM | POA: Diagnosis not present

## 2017-12-05 MED ORDER — LEVALBUTEROL HCL 0.63 MG/3ML IN NEBU: 0.6300 mg | INHALATION_SOLUTION | RESPIRATORY_TRACT | 5 refills | Status: DC | PRN

## 2017-12-05 MED ORDER — TIOTROPIUM BROMIDE-OLODATEROL 2.5-2.5 MCG/ACT IN AERS: 2.0000 | INHALATION_SPRAY | Freq: Every day | RESPIRATORY_TRACT | 0 refills | Status: DC

## 2017-12-05 NOTE — Patient Instructions (Signed)
Walking oximetry today on room air. We will try starting Stiolto 2 puffs once daily.  If you benefit from this medication then we will order it through your pharmacy and continue it. Stop using Atrovent (ipratropium) nebulizer treatments. We will replace this with Xopenex albuterol treatments which you can use up to every 6 hours if needed for shortness of breath. Flu shot today Follow with Dr [name] in 2 months or sooner if you have any problems.

## 2017-12-05 NOTE — Assessment & Plan Note (Signed)
Severe obstructive lung disease.  She is not on a stable bronchodilator regimen in large part because she is had difficulty tolerating different options, affording different options.  I do not want to perturb her atrial fibrillation which is also been a problem.  I will try Stiolto, Xopenex nebs as needed to see if she benefits.  Follow-up in 2 months to assess her clinical status on this regimen.  She may have occult desaturation, especially given her chronic progressive edema.  We will perform a walking oximetry today and recommend oxygen if she qualifies.  Walking oximetry today on room air. We will try starting Stiolto 2 puffs once daily.  If you benefit from this medication then we will order it through your pharmacy and continue it. Stop using Atrovent (ipratropium) nebulizer treatments. We will replace this with Xopenex albuterol treatments which you can use up to every 6 hours if needed for shortness of breath. Flu shot today Follow with Dr [name] in 2 months or sooner if you have any problems.

## 2017-12-05 NOTE — Progress Notes (Signed)
Subjective:    Patient ID: Destiny Hughes, female    DOB: 02/10/1946, 72 y.o.   MRN: [id number]  HPI 72 year old former smoker with a history of depression, dementia, GERD, hyperlipidemia, hypothyroidism, chronic pain with a implanted nerve stimulator, A Fib s/p cardioversion last year.  She is been followed by Destiny Hughes in our office for very severe COPD with emphysematous change.  FEV1 0.75 L (35% predicted) 08/27/2015. Last seen here 04/2016.   Since last visit she has had a lot of trouble with delirium, probable component dementia / depression, effects of narcotics (have been weaned significantly). Has had a lot of adjustments made w assistance of psychiatry.   She and family report decreased functional capacity, more dyspnea w exertion. She has gained some weight, some edema over the last several months. She has a daily non-prod cough. A lot of clear nasal gtt, on fexofenadine.  She's been tried on breo, [place], spiriva before - either cost or side effects were barriers.    Review of Systems  Past Medical History:  Diagnosis Date  . Chronic back pain   . Cognitive retention disorder   . Collapse of right lung   . COPD (chronic obstructive pulmonary disease) (HCC)    Hx. Bronchitis occ, 01-25-11 somes issue now taking  Z-pak-started 01-24-11/Scar tissue  present  from previous lung collapse  . Dementia with psychosis   . Depression   . Depression   . GERD (gastroesophageal reflux disease)    tx. TUMS  . Glaucoma   . Hyperlipemia   . Hypothyroidism    tx. Levothyroxine  . Major depression with psychotic features (Destiny Hughes)   . Nephrolithiasis   . Neuromuscular disorder ([place]) 01-25-11   Pain/nerve stimulator implanted -2 yrs ago.  Marland Kitchen Reflex, gag, absent   . Reflex, gag, absent      Family History  Problem Relation Age of Onset  . Heart attack Father   . Emphysema Father   . Aneurysm Mother        CEREBRAL  . Emphysema Mother   . Dementia Mother   . Diabetes Son   .  Hypertension Son   . Hypertension Son   . Cancer Sister        Widely Metastatic  . Asthma Son        x2  . Breast cancer Paternal Aunt   . Rheum arthritis Maternal Aunt      Social History   Socioeconomic History  . Marital status: Married    Spouse name: Not on file  . Number of children: 2  . Years of education: Not on file  . Highest education level: Not on file  Occupational History  . Occupation: disabled  Social Needs  . Financial resource strain: Not on file  . Food insecurity:    Worry: Not on file    Inability: Not on file  . Transportation needs:    Medical: Not on file    Non-medical: Not on file  Tobacco Use  . Smoking status: Former Smoker    Packs/day: 0.75    Years: 50.00    Pack years: 37.50    Types: Cigarettes    Start date: 09/13/1961    Last attempt to quit: 05/2016    Years since quitting: 1.5  . Smokeless tobacco: Never Used  . Tobacco comment: Decreased Down to 2/3 a Day if Thinking [place] It  Substance and Sexual Activity  . Alcohol use: No    Alcohol/week: 0.0 standard drinks  Comment: none in 10 years  . Drug use: No  . Sexual activity: Never  Lifestyle  . Physical activity:    Days per week: Not on file    Minutes per session: Not on file  . Stress: Not on file  Relationships  . Social connections:    Talks on phone: Not on file    Gets together: Not on file    Attends religious service: Not on file    Active member of club or organization: Not on file    Attends meetings of clubs or organizations: Not on file    Relationship status: Not on file  . Intimate partner violence:    Fear of current or ex partner: Not on file    Emotionally abused: Not on file    Physically abused: Not on file    Forced sexual activity: Not on file  Other Topics Concern  . Not on file  Social History Narrative   She is from [facility]. She has always lived in [place]. She has traveled to [place], [place], [place], [place], & [place]. 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 Prior exposure to parakeets with last exposure remote in her current home. Previously had mold in her home that was in her bathroom & fixed. Prior exposure to hot tubs but none recently. Pt lives at home with Destiny Hughes, husband.  Has 2 childrean, 12th grade education.       Allergies  Allergen Reactions  . Albuterol Other (See Comments) and Shortness Of Breath    Had difficulty breathing after a nebulizer treatment  . Penicillins Anaphylaxis and Swelling    Has patient had a PCN reaction causing immediate rash, facial/tongue/throat swelling, SOB or lightheadedness with hypotension: Yes Has patient had a PCN reaction causing severe rash involving mucus membranes or skin necrosis: Rash Has patient had a PCN reaction that required hospitalization: No Has patient had a PCN reaction occurring within the last 10 years: No If all of the above answers are "NO", then may proceed with Cephalosporin use.  Destiny Hughes [Kdc:Albuterol] Shortness Of Breath  . Sulfa Antibiotics Swelling    Throat swells, but no shortness of breath noted  . Zanaflex [Tizanidine] Other (See Comments)    Possible confusion (??)  . Codeine Nausea And Vomiting  . Ecotrin [Aspirin] Nausea And Vomiting     Outpatient Medications Prior to Visit  Medication Sig Dispense Refill  . amLODipine (NORVASC) 5 MG tablet TAKE ONE TABLET EACH DAY 90 tablet 1  . divalproex (DEPAKOTE ER) 500 MG 24 hr tablet 2  qam 60 tablet 3  . donepezil (ARICEPT) 10 MG tablet 1  qhs 30 tablet 3  . furosemide (LASIX) 20 MG tablet Take 1 tablet (20 mg total) by mouth daily. 30 tablet 0  . gabapentin (NEURONTIN) 100 MG capsule TAKE ONE CAPSULE TWICE A DAY 180 capsule 2  . ipratropium (ATROVENT) 0.02 % nebulizer solution INHALE 2.43ml BY NEBULIZER EVERY 6 HOURS 75 mL 0  . levothyroxine (SYNTHROID, LEVOTHROID) 125 MCG tablet TAKE ONE TABLET DAILY  BEFORE BREAKFAST 90 tablet 0  . lisinopril (PRINIVIL,ZESTRIL) 5 MG tablet Take 1 tablet (5 mg total) by mouth daily. 30 tablet 5  . magnesium oxide (MAG-OX) 400 MG tablet Take 1 tablet (400 mg total) by mouth daily. 30 tablet 3  . Melatonin 3 MG TABS Take 3 mg by mouth at  bedtime.    . memantine (NAMENDA) 5 MG tablet TAKE ONE TABLET DAILY 90 tablet 3  . propranolol (INDERAL) 80 MG tablet TAKE ONE-HALF TABLET 3 TIMES DAILY 135 tablet 3  . sodium chloride 1 g tablet TAKE ONE TABLET THREE TIMES DAILY 180 tablet 0  . temazepam (RESTORIL) 30 MG capsule Take 1 capsule (30 mg total) by mouth at bedtime as needed for sleep. 30 capsule 0  . predniSONE (DELTASONE) 20 MG tablet One daily with food 5 tablet 0   No facility-administered medications prior to visit.         Objective:   Physical Exam Vitals:   12/05/17 1409  BP: 118/74  Pulse: 60  SpO2: 95%  Weight: 185 lb (83.9 kg)  Height: 5\' 5"  (1.651 m)   Gen: Pleasant, well-nourished, in no distress, somewhat pressured affect,  ENT: No lesions,  mouth clear,  oropharynx clear, no postnasal drip  Neck: No JVD, no stridor  Lungs: No use of accessory muscles, decreased bilaterally, no wheezing, no crackles  Cardiovascular: RRR, heart sounds normal, no murmur or gallops, 1-2+ peripheral edema  Musculoskeletal: No deformities, no cyanosis or clubbing  Neuro: alert, forgetful, some pressured speech.  Moves all extremities, no apparent focal deficits  Skin: Warm, no lesions or rash     Assessment & Plan:  COPD (chronic obstructive pulmonary disease) (HCC) Severe obstructive lung disease.  She is not on a stable bronchodilator regimen in large part because she is had difficulty tolerating different options, affording different options.  I do not want to perturb her atrial fibrillation which is also been a problem.  I will try Stiolto, Xopenex nebs as needed to see if she benefits.  Follow-up in 2 months to assess her clinical status on this  regimen.  She may have occult desaturation, especially given her chronic progressive edema.  We will perform a walking oximetry today and recommend oxygen if she qualifies.  Walking oximetry today on room air. We will try starting Stiolto 2 puffs once daily.  If you benefit from this medication then we will order it through your pharmacy and continue it. Stop using Atrovent (ipratropium) nebulizer treatments. We will replace this with Xopenex albuterol treatments which you can use up to every 6 hours if needed for shortness of breath. Flu shot today Follow with Dr [name] in 2 months or sooner if you have any problems.  Baltazar Apo, MD, PhD 12/05/2017, 2:34 PM [facility] [phone] or if no answer [phone]

## 2017-12-06 ENCOUNTER — Other Ambulatory Visit: Payer: Self-pay | Admitting: Family Medicine

## 2017-12-06 DIAGNOSIS — G894 Chronic pain syndrome: Secondary | ICD-10-CM | POA: Diagnosis not present

## 2017-12-06 DIAGNOSIS — Z79891 Long term (current) use of opiate analgesic: Secondary | ICD-10-CM | POA: Diagnosis not present

## 2017-12-06 DIAGNOSIS — M791 Myalgia, unspecified site: Secondary | ICD-10-CM | POA: Diagnosis not present

## 2017-12-06 DIAGNOSIS — M961 Postlaminectomy syndrome, not elsewhere classified: Secondary | ICD-10-CM | POA: Diagnosis not present

## 2017-12-07 ENCOUNTER — Ambulatory Visit (INDEPENDENT_AMBULATORY_CARE_PROVIDER_SITE_OTHER): Payer: Medicare Other | Admitting: Surgery

## 2017-12-07 ENCOUNTER — Encounter (INDEPENDENT_AMBULATORY_CARE_PROVIDER_SITE_OTHER): Payer: Self-pay | Admitting: Surgery

## 2017-12-07 ENCOUNTER — Ambulatory Visit (INDEPENDENT_AMBULATORY_CARE_PROVIDER_SITE_OTHER): Payer: Self-pay

## 2017-12-07 DIAGNOSIS — M25561 Pain in right knee: Secondary | ICD-10-CM

## 2017-12-07 DIAGNOSIS — G8929 Other chronic pain: Secondary | ICD-10-CM

## 2017-12-07 DIAGNOSIS — M1712 Unilateral primary osteoarthritis, left knee: Secondary | ICD-10-CM | POA: Diagnosis not present

## 2017-12-07 DIAGNOSIS — M25562 Pain in left knee: Secondary | ICD-10-CM

## 2017-12-07 NOTE — Progress Notes (Signed)
Office Visit Note   Patient: Destiny Hughes           Date of Birth: 10-10-1945           MRN: [id number] Visit Date: 12/07/2017              Requested by: Billie Ruddy, MD [place], [place] [address] PCP: Billie Ruddy, MD   Assessment & Plan: Visit Diagnoses:  1. Chronic pain of both knees   2. Arthritis of left knee     Plan: Today I recommend conservative treatment with left knee injection.  Patient sent left knee was prepped with Betadine and intra-articular Marcaine/Depo-Medrol injection performed.  After sitting for a few minutes patient did report good improvement of her pain with Marcaine in place.  Follow-up in a few weeks for recheck with Dr. Lorin Mercy.  We did discuss possibly trying Visco supplementation.  We also discussed that ultimately may come down to needing total knee replacement but she is hoping to build to put that off for as long as possible.  She is status post right total knee replacement several years ago and eventually went on to have 2 revisions so she is a little hesitant in wanting to have the left knee done.  Follow-Up Instructions: Return in about 4 weeks (around 01/04/2018) for with yates recheck left knee.   Orders:  Orders Placed This Encounter  Procedures  . XR Knee 1-2 Views Right  . XR Knee 1-2 Views Left   No orders of the defined types were placed in this encounter.     Procedures: Large Joint Inj: L knee on 12/08/2017 2:18 PM Indications: pain and joint swelling Details: 22 G 1.5 in needle, superolateral approach Medications: 3 mL lidocaine 1 %; 6 mL bupivacaine 0.25 %; 80 mg methylPREDNISolone acetate 40 MG/ML Aspirate: 75 mL serous Outcome: tolerated well, no immediate complications Consent was given by the patient. Patient was prepped and draped in the usual sterile fashion.       Clinical Data: No additional findings.   Subjective: Chief Complaint  Patient presents with  . Left Knee - Pain  .  Right Knee - Pain    HPI 72 year old white female with history of end-stage DJD left knee and pain comes in for evaluation.  She has not been seen in our clinic for several years.  She has known history of left knee DJD and was last seen by Dr. Marchia Bond in 2017.  States that she has had right total knee replacement Dr. Lorin Mercy several years ago and then went on to have 2 total knee revisions by Dr. Paralee Cancel.  Left knee pain when she is ambulating, getting up seated position and stairs.  Has swelling and popping.  Knee has been stiff. Review of Systems No current cardiac pulmonary GI GU issues  Objective: Vital Signs: There were no vitals taken for this visit.  Physical Exam  Constitutional: No distress.  HENT:  Head: Normocephalic and atraumatic.  Eyes: Pupils are equal, round, and reactive to light.  Neck: Normal range of motion.  Pulmonary/Chest: No respiratory distress.  Musculoskeletal:  Gait antalgic.  Left knee 3+ effusion.  Knee range of motion about 0 to 90 degrees.  Positive crepitus.  Joint line tender.  No signs of infection.  Calf nontender.  Neurovascular intact.  Skin: Skin is warm and dry.  Psychiatric: She has a normal mood and affect.    Ortho Exam  Specialty Comments:  No specialty comments available.  Imaging: No results found.   PMFS History: Patient Active Problem List   Diagnosis Date Noted  . Hallucinations   . Psychosis ([place]) 11/07/2016  . Acute respiratory failure with hypoxia and hypercapnia ([place]) 11/03/2016  . Other chest pain 10/23/2016  . Tobacco abuse 10/23/2016  . Major depression with psychotic features ([place]) 10/19/2016  . Dementia with behavioral disturbance 10/19/2016  . Depression due to physical illness   . Cognitive retention disorder   . Atrial flutter ([place]) 08/15/2016  . Paroxysmal atrial fibrillation ([place]) 08/15/2016  . SVT (supraventricular tachycardia) ([place]) 07/11/2016  . Weight loss 07/08/2016  . AKI (acute kidney  injury) ([place]) 07/02/2016  . Acute kidney injury ([place]) 07/01/2016  . Hyperglycemia 07/01/2016  . Hyponatremia 07/01/2016  . Intractable nausea and vomiting 07/01/2016  . Abnormal EKG 07/01/2016  . Diastolic CHF ([place]) 99/83/3825  . Medication management 04/28/2015  . Emphysema lung ([place]) 12/26/2014  . Gastroesophageal reflux disease without esophagitis 12/26/2014  . Chronic bronchitis ([place]) 11/13/2014  . COPD (chronic obstructive pulmonary disease) ([place]) 10/03/2014  . At high risk for falls 10/03/2014  . Other specified hypothyroidism 10/03/2014  . Mitral valve prolapse 10/03/2014  . Encounter for Medicare annual wellness exam 10/03/2014  . IBS (irritable bowel syndrome) 04/01/2014  . S/P total knee replacement 04/22/2013  . Knee pain, chronic 04/22/2013  . Essential hypertension 04/15/2013  . Vitamin D deficiency 04/15/2013  . Encounter for long-term (current) use of medications 04/15/2013  . Prediabetes 04/15/2013  . Osteoarthritis 01/31/2011  . Hyperlipemia   . Glaucoma   . Chronic pain disorder   . Chronic back pain    Past Medical History:  Diagnosis Date  . Chronic back pain   . Cognitive retention disorder   . Collapse of right lung   . COPD (chronic obstructive pulmonary disease) ([facility])    Hx. Bronchitis occ, 01-25-11 somes issue now taking  Z-pak-started 01-24-11/Scar tissue  present  from previous lung collapse  . Dementia with psychosis   . Depression   . Depression   . GERD (gastroesophageal reflux disease)    tx. TUMS  . Glaucoma   . Hyperlipemia   . Hypothyroidism    tx. Levothyroxine  . Major depression with psychotic features ([place])   . Nephrolithiasis   . Neuromuscular disorder ([place]) 01-25-11   Pain/nerve stimulator implanted -2 yrs ago.  Marland Kitchen Reflex, gag, absent   . Reflex, gag, absent     Family History  Problem Relation Age of Onset  . Heart attack Father   . Emphysema Father   . Aneurysm Mother        CEREBRAL  . Emphysema Mother   . Dementia  Mother   . Diabetes Son   . Hypertension Son   . Hypertension Son   . Cancer Sister        Widely Metastatic  . Asthma Son        x2  . Breast cancer Paternal Aunt   . Rheum arthritis Maternal Aunt     Past Surgical History:  Procedure Laterality Date  . A-FLUTTER ABLATION N/A 08/16/2016   Procedure: A-Flutter Ablation;  Surgeon: Evans Lance, MD;  Location: [facility];  Service: Cardiovascular;  Laterality: N/A;  . APPENDECTOMY  01-25-11  . CARPAL TUNNEL RELEASE  01-25-11   Bil.  . CERVICAL SPINE SURGERY  01-25-11   2005-multiple levels  . CHOLECYSTECTOMY  01-25-11   '07  . COLONOSCOPY WITH PROPOFOL N/A 07/23/2012  Procedure: COLONOSCOPY WITH PROPOFOL;  Surgeon: Garlan Fair, MD;  Location: [facility];  Service: Endoscopy;  Laterality: N/A;  . EYE SURGERY    . JOINT REPLACEMENT  01-25-11   6'03 LTHA-hemi  . KNEE ARTHROPLASTY  01-25-11   '04-right, revised x2  . no gag reflex     Pt does not havea gag reflex following  siurgery to neck. Family unsure of date. Pt takes pill in apple sauce.  . SPINAL CORD STIMULATOR IMPLANT  2009 APPROX   DR. ELSNER  . THORACIC SPINE SURGERY  01-25-11   6'02- then nerve stimulator implanted after  . TOTAL KNEE REVISION  02/01/2011   Procedure: TOTAL KNEE REVISION;  Surgeon: Mauri Pole;  Location: [facility];  Service: Orthopedics;  Laterality: Right;  . TUBAL LIGATION     Social History   Occupational History  . Occupation: disabled  Tobacco Use  . Smoking status: Former Smoker    Packs/day: 0.75    Years: 50.00    Pack years: 37.50    Types: Cigarettes    Start date: 09/13/1961    Last attempt to quit: 05/2016    Years since quitting: 1.5  . Smokeless tobacco: Never Used  . Tobacco comment: Decreased Down to 2/3 a Day if Thinking [place] It  Substance and Sexual Activity  . Alcohol use: No    Alcohol/week: 0.0 standard drinks    Comment: none in 10 years  . Drug use: No  . Sexual activity: Never

## 2017-12-08 ENCOUNTER — Encounter (INDEPENDENT_AMBULATORY_CARE_PROVIDER_SITE_OTHER): Payer: Self-pay | Admitting: Surgery

## 2017-12-08 ENCOUNTER — Telehealth: Payer: Self-pay | Admitting: Emergency Medicine

## 2017-12-08 DIAGNOSIS — M1712 Unilateral primary osteoarthritis, left knee: Secondary | ICD-10-CM | POA: Diagnosis not present

## 2017-12-08 DIAGNOSIS — M25562 Pain in left knee: Secondary | ICD-10-CM | POA: Diagnosis not present

## 2017-12-08 DIAGNOSIS — M25561 Pain in right knee: Secondary | ICD-10-CM | POA: Diagnosis not present

## 2017-12-08 DIAGNOSIS — G8929 Other chronic pain: Secondary | ICD-10-CM | POA: Diagnosis not present

## 2017-12-08 MED ORDER — LIDOCAINE HCL 1 % IJ SOLN
3.0000 mL | INTRAMUSCULAR | Status: AC | PRN
  Administered 2017-12-08: 3 mL

## 2017-12-08 MED ORDER — BUPIVACAINE HCL 0.25 % IJ SOLN
6.0000 mL | INTRAMUSCULAR | Status: AC | PRN
  Administered 2017-12-08: 6 mL via INTRA_ARTICULAR

## 2017-12-08 MED ORDER — METHYLPREDNISOLONE ACETATE 40 MG/ML IJ SUSP
80.0000 mg | INTRAMUSCULAR | Status: AC | PRN
  Administered 2017-12-08: 80 mg

## 2017-12-08 NOTE — Telephone Encounter (Signed)
Received PA request from [name] for levalbuterol 0.63 mg Pt ID number [id number] Called [phone] to initiate PA  Pt with Atrial flutter and COPD  Can not take plain albuterol  Was advised that this has to be completed on CMM  Key-[id number]  Will forward to LL to f/u on

## 2017-12-09 ENCOUNTER — Encounter: Payer: Self-pay | Admitting: Nurse Practitioner

## 2017-12-11 NOTE — Telephone Encounter (Signed)
Pt is calling back about the nebulizer from [employer] [id number]

## 2017-12-11 NOTE — Telephone Encounter (Signed)
Spoke with Lattie Haw at [employer] at [phone]. She stated that they did not have a PA on file for levalbuterol. Advised her that one had been started via CMM on 12/08/17, she still did not see this in the patient's record. She gave me a new key of [id number].  PA has been started again. Will check back later for a determination. Left message for patient to call back for update.

## 2017-12-13 ENCOUNTER — Telehealth (INDEPENDENT_AMBULATORY_CARE_PROVIDER_SITE_OTHER): Payer: Self-pay | Admitting: Surgery

## 2017-12-13 NOTE — Telephone Encounter (Signed)
Please advise. Patient has follow up appointment with you on 01/10/18.  Would you like for her to be seen sooner?

## 2017-12-13 NOTE — Telephone Encounter (Signed)
Patient's husband [name]) called advised patient is still complaining of pain in her left knee. [name] said patient can put just a little weight on her knee. He advised the knee is still swollen. [name] asked what can be done to help with the pain. The number to contact [name] is [phone]

## 2017-12-14 ENCOUNTER — Telehealth: Payer: Self-pay | Admitting: Family Medicine

## 2017-12-14 NOTE — Telephone Encounter (Signed)
Called and advised, rescheduled appt to see Dr. Lorin Mercy on Tues 10/15

## 2017-12-14 NOTE — Telephone Encounter (Signed)
Copied from [place] [id number]. Topic: General - Other >> Dec 14, 2017 12:16 PM Burchel, Abbi R wrote: Ivin Booty ([facility]) asking if pt needs to be pre-medicated before dental work.  Please fax info to:   Fx: [phone]

## 2017-12-14 NOTE — Telephone Encounter (Signed)
Please have Jeneen Rinks advise.

## 2017-12-14 NOTE — Telephone Encounter (Signed)
Okay to schedule sooner appointment with Dr. Lorin Mercy.  Recommend using ice off and on as needed.  Need to give injection more time to take effect.

## 2017-12-14 NOTE — Telephone Encounter (Signed)
Patients husband [name] left a voicemail to check on this message, he has not received a call from [facility] about patients left knee being very painful. Please advise # [phone]

## 2017-12-15 NOTE — Telephone Encounter (Signed)
Called and Spoke with Verdis Frederickson with [facility] at [phone]. She stated that they did not have a PA on file for levalbuterol nebulizer medication for the second time. Advised her that one had been started via CMM again today while on the phone with her 12/15/17, she still did not see this in the patient's record. She gave me a new Key: [facility] is not able to process this request through [place], please contact the plan at [phone] or fax in request to [phone]. *NOTICE ABOUT OPIOIDS*: Effective Mar 21, 2017 Medicare Part D plans have implemented new opioid safety edits, including a 7 day supply limit for opioid naiive patients. If your patient is opioid naiive, decrease the quantity and days supply of the script to less than 7 days. If a day supply > 7 days is needed, please contact the plan at [phone] or fax in request to [phone]. For claims rejecting for 68-TMH Duplicate [place] Opioids and/or 88-DUR Opioid/Benzo Drug interaction no coverage determination is required. Please contact the filling pharmacy directly.  Attempted to call patient today regarding above info on neb meds. I did not receive an answer at time of call. I have left a voicemail message for pt to return call. X1  Routing message to [place] regarding PA has been tried 3 times with [facility].

## 2017-12-15 NOTE — Telephone Encounter (Signed)
Unsure if pt would need to be medicated, typically does well with re-direction and her husband being present.  May depend on what she is having done.

## 2017-12-18 NOTE — Telephone Encounter (Signed)
Please Advise if ok to fax a letter stating pt does not need pre-med to [facility]

## 2017-12-18 NOTE — Telephone Encounter (Signed)
Again, unable to make this determination, as unsure of what pt is having done.

## 2017-12-18 NOTE — Telephone Encounter (Signed)
Called left a message with Tiamarie with [facility], left a detailed message with Dr Volanda Napoleon instructions regarding pt appointment

## 2017-12-18 NOTE — Telephone Encounter (Signed)
[employer], called and asked for a fax to be sent basically just for where the pt does not need pre-med;  Fax: [phone]

## 2017-12-19 ENCOUNTER — Other Ambulatory Visit (HOSPITAL_COMMUNITY): Payer: Self-pay | Admitting: Psychiatry

## 2017-12-19 NOTE — Telephone Encounter (Signed)
Spoke with [employer] with [facility] states that pt is having filling and cleaning of her teeth. Please advise

## 2017-12-20 ENCOUNTER — Ambulatory Visit (HOSPITAL_COMMUNITY): Payer: Self-pay | Admitting: Psychiatry

## 2017-12-20 DIAGNOSIS — G8912 Acute post-thoracotomy pain: Secondary | ICD-10-CM | POA: Diagnosis not present

## 2017-12-20 DIAGNOSIS — Z4542 Encounter for adjustment and management of neuropacemaker (brain) (peripheral nerve) (spinal cord): Secondary | ICD-10-CM | POA: Diagnosis not present

## 2017-12-21 ENCOUNTER — Other Ambulatory Visit (HOSPITAL_COMMUNITY): Payer: Self-pay

## 2017-12-21 MED ORDER — DONEPEZIL HCL 10 MG PO TABS: ORAL_TABLET | ORAL | 0 refills | Status: DC

## 2017-12-21 MED ORDER — TEMAZEPAM 30 MG PO CAPS: 30.0000 mg | ORAL_CAPSULE | Freq: Every evening | ORAL | 0 refills | Status: DC | PRN

## 2017-12-22 NOTE — Telephone Encounter (Signed)
If dental office has a form they can fax it over, otherwise ok to give VO as previously stated.

## 2017-12-22 NOTE — Telephone Encounter (Signed)
Left detailed message on machine at dental office for someone to fax a form.

## 2017-12-25 ENCOUNTER — Telehealth: Payer: Self-pay

## 2017-12-25 ENCOUNTER — Telehealth: Payer: Self-pay | Admitting: Emergency Medicine

## 2017-12-25 ENCOUNTER — Other Ambulatory Visit: Payer: Self-pay | Admitting: Anesthesiology

## 2017-12-25 NOTE — Telephone Encounter (Signed)
Called pt's insurance company at [phone]. Started PA over the phone. PA has been approved.  Spoke with pt's husband. He is aware of this information. Nothing further was needed at this time.

## 2017-12-25 NOTE — Telephone Encounter (Signed)
   [facility] Pre-operative Risk Assessment    Request for surgical clearance:  1. What type of surgery is being performed?  Spinal cord stimulator battery replacement   2. When is this surgery scheduled?  01/12/18   3. What type of clearance is required (medical clearance vs. Pharmacy clearance to hold med vs. Both)?  medical  4. Are there any medications that need to be held prior to surgery and how long?    5. Practice name and name of physician performing surgery?  [facility]/ Dr Maryjean Ka   6. What is your office phone number [phone]    7.   What is your office fax number [phone]  8.   Anesthesia type (None, local, MAC, general) ?     Legrand Como  Marik Sedore 12/25/2017, 4:14 PM  _________________________________________________________________   (provider comments below)

## 2017-12-26 NOTE — Telephone Encounter (Signed)
Called and spoke to [place], [employer] [phone] ext [phone].  She stated that the levalbuterol prescription needed to be rebilled at  Pharmacy with her medicare part B.  Mechele Claude is refaxing [facility] a correct letter and calling the Patient with correct information. Called [name] and was placed on hold.  Pharmacist that deals with PA's had stepped out and is to return call re guarding prescription/PA.

## 2017-12-26 NOTE — Telephone Encounter (Signed)
[facility] [phone] ext [phone]) returned phone call

## 2017-12-26 NOTE — Telephone Encounter (Signed)
Joanne/[facility] calling about PA [phone] opt 3.

## 2017-12-26 NOTE — Telephone Encounter (Signed)
Called [facility]. Was placed on hold for over 12 minutes. Selected option 1 to keep my place and have them call us back.

## 2017-12-26 NOTE — Telephone Encounter (Signed)
Destiny Hughes, Pharmacist [name], returned call.  She stated that they were rebilling levalbuterol through Patients Medicare B. She was going to call Patient to let her know and to find out if she owned or was renting her neb machine.  Per Destiny Hughes, Medicare B requires her to own her neb machine.  She stated that if there were in other issues or questions they would contact [facility].  Nothing further at this time.

## 2017-12-26 NOTE — Telephone Encounter (Signed)
   Primary Cardiologist: Cristopher Peru, MD  Chart reviewed as part of pre-operative protocol coverage. Patient was contacted 12/26/2017 in reference to pre-operative risk assessment for pending surgery as outlined below.  Lauri Purdum Hughes was last seen on 10/20/17 by Ms Destiny Jude, NP for Dr. Lovena Le.  Since that day, Destiny Hughes has done well with no further a flutter.  She has no chest pain.  Does have DOE related to her COPD.  Hx of negative stress test.    Therefore, based on ACC/AHA guidelines, the patient would be at acceptable risk for the planned procedure without further cardiovascular testing.   I will route this recommendation to the requesting party via Epic fax function and remove from pre-op pool.  Please call with questions.  Cecilie Kicks, NP 12/26/2017, 3:40 PM

## 2017-12-27 ENCOUNTER — Other Ambulatory Visit: Payer: Self-pay | Admitting: Family Medicine

## 2017-12-27 DIAGNOSIS — R600 Localized edema: Secondary | ICD-10-CM

## 2017-12-28 ENCOUNTER — Other Ambulatory Visit: Payer: Self-pay | Admitting: Family Medicine

## 2017-12-28 NOTE — Telephone Encounter (Signed)
Advised to follow the dentistry patient questionnaire for pt.

## 2017-12-28 NOTE — Telephone Encounter (Signed)
Spoke with Destiny Hughes with [facility] advised that pt has a list of allergies on Penicillins,Sulfa and Codeine.

## 2018-01-02 ENCOUNTER — Encounter (INDEPENDENT_AMBULATORY_CARE_PROVIDER_SITE_OTHER): Payer: Self-pay | Admitting: Orthopaedic Surgery

## 2018-01-02 ENCOUNTER — Ambulatory Visit (INDEPENDENT_AMBULATORY_CARE_PROVIDER_SITE_OTHER): Payer: Medicare Other | Admitting: Orthopaedic Surgery

## 2018-01-02 VITALS — BP 124/66 | HR 62 | Ht 65.0 in | Wt 185.0 lb

## 2018-01-02 DIAGNOSIS — M1712 Unilateral primary osteoarthritis, left knee: Secondary | ICD-10-CM

## 2018-01-02 NOTE — Progress Notes (Signed)
Office Visit Note   Patient: Destiny Hughes           Date of Birth: [birth year]           MRN: [id number] Visit Date: 01/02/2018              Requested by: Billie Ruddy, MD [place], [place] [address] PCP: Billie Ruddy, MD   Assessment & Plan: Visit Diagnoses:  1. Unilateral primary osteoarthritis, left knee     Plan: Left knee injection performed which she tolerated well.  Hopefully she will get some relief from this.  Her knee x-rays do demonstrate medial compartment arthritis with joint space narrowing and marginal osteophytes.  She continues to have pain despite 2 revision surgeries on her right knee and has a well fixed cemented revision prosthesis.  She can follow-up with me on a as needed basis.  X-ray results were reviewed with patient and family members.  Follow-Up Instructions: No follow-ups on file.   Orders:  Orders Placed This Encounter  Procedures  . Large Joint Inj: L knee   No orders of the defined types were placed in this encounter.     Procedures: Large Joint Inj: L knee on 01/02/2018 3:47 PM Indications: joint swelling and pain Details: 22 G 1.5 in needle, anterolateral approach  Arthrogram: No  Medications: 0.5 mL lidocaine 1 %; 3 mL bupivacaine 0.5 %; 40 mg methylPREDNISolone acetate 40 MG/ML Outcome: tolerated well, no immediate complications Procedure, treatment alternatives, risks and benefits explained, specific risks discussed. Consent was given by the patient. Immediately prior to procedure a time out was called to verify the correct patient, procedure, equipment, support staff and site/side marked as required. Patient was prepped and draped in the usual sterile fashion.       Clinical Data: No additional findings.   Subjective: Chief Complaint  Patient presents with  . Left Knee - Pain, Follow-up    HPI 72 year old female returns with ongoing problems with left knee pain.  She got some temporary  relief the injection requesting a repeat injection.  Visco injection was previously discussed.  Right total knee arthroplasty computer-assisted done by me several years ago she later had 2 revision surgeries in all 1 revision surgery the tibia could not be removed since it was solid and these procedures were done by Dr. Ihor Gully.  She has a revision component states her right knee continues to give her pain..  Review of Systems review of systems positive for right knee arthroplasty with revision surgeries and persistent pain.  Chronic pain disorder, chronic back pain, hypertension, prediabetes, IBS, COPD, emphysema, diastolic heart failure, mitral valve prolapse, depression, dementia, psychosis with hallucinations.  Previously chronic main pain management with Dr. Nicholaus Bloom 2018.  Now she takes temazepam just at night no narcotics listed.   Objective: Vital Signs: BP 124/66   Pulse 62   Ht 5\' 5"  (1.651 m)   Wt 185 lb (83.9 kg)   BMI 30.79 kg/m   Physical Exam  Constitutional: She is oriented to person, place, and time. She appears well-developed.  HENT:  Head: Normocephalic.  Right Ear: External ear normal.  Left Ear: External ear normal.  Eyes: Pupils are equal, round, and reactive to light.  Neck: No tracheal deviation present. No thyromegaly present.  Cardiovascular: Normal rate.  Pulmonary/Chest: Effort normal.  Abdominal: Soft.  Neurological: She is alert and oriented to person, place, and time.  Skin: Skin is warm and dry.  Psychiatric: She  has a normal mood and affect. Her behavior is normal.    Ortho Exam well-healed midline incision right knee.  Patient is in a wheelchair.  She has left knee tenderness and crepitus with knee range of motion more medial on the lateral joint line tenderness and pain with patellofemoral loading with quadriceps contracture.  She has bilateral quad weakness abductor weakness.  Specialty Comments:  No specialty comments available.  Imaging: No  results found.   PMFS History: Patient Active Problem List   Diagnosis Date Noted  . Hallucinations   . Psychosis ([place]) 11/07/2016  . Acute respiratory failure with hypoxia and hypercapnia ([place]) 11/03/2016  . Other chest pain 10/23/2016  . Tobacco abuse 10/23/2016  . Major depression with psychotic features ([place]) 10/19/2016  . Dementia with behavioral disturbance ([place]) 10/19/2016  . Depression due to physical illness   . Cognitive retention disorder   . Atrial flutter ([place]) 08/15/2016  . Paroxysmal atrial fibrillation ([place]) 08/15/2016  . SVT (supraventricular tachycardia) ([place]) 07/11/2016  . Weight loss 07/08/2016  . AKI (acute kidney injury) ([place]) 07/02/2016  . Acute kidney injury ([place]) 07/01/2016  . Hyperglycemia 07/01/2016  . Hyponatremia 07/01/2016  . Intractable nausea and vomiting 07/01/2016  . Abnormal EKG 07/01/2016  . Diastolic CHF ([place]) 25/95/6387  . Medication management 04/28/2015  . Emphysema lung ([place]) 12/26/2014  . Gastroesophageal reflux disease without esophagitis 12/26/2014  . Chronic bronchitis ([place]) 11/13/2014  . COPD (chronic obstructive pulmonary disease) ([place]) 10/03/2014  . At high risk for falls 10/03/2014  . Other specified hypothyroidism 10/03/2014  . Mitral valve prolapse 10/03/2014  . Encounter for Medicare annual wellness exam 10/03/2014  . IBS (irritable bowel syndrome) 04/01/2014  . S/P total knee replacement 04/22/2013  . Knee pain, chronic 04/22/2013  . Essential hypertension 04/15/2013  . Vitamin D deficiency 04/15/2013  . Encounter for long-term (current) use of medications 04/15/2013  . Prediabetes 04/15/2013  . Osteoarthritis 01/31/2011  . Hyperlipemia   . Glaucoma   . Chronic pain disorder   . Chronic back pain    Past Medical History:  Diagnosis Date  . Chronic back pain   . Cognitive retention disorder   . Collapse of right lung   . COPD (chronic obstructive pulmonary disease) (HCC)    Hx. Bronchitis occ, 01-25-11 somes  issue now taking  Z-pak-started 01-24-11/Scar tissue  present  from previous lung collapse  . Dementia with psychosis ([place])   . Depression   . Depression   . GERD (gastroesophageal reflux disease)    tx. TUMS  . Glaucoma   . Hyperlipemia   . Hypothyroidism    tx. Levothyroxine  . Major depression with psychotic features ([place])   . Nephrolithiasis   . Neuromuscular disorder ([place]) 01-25-11   Pain/nerve stimulator implanted -2 yrs ago.  Marland Kitchen Reflex, gag, absent   . Reflex, gag, absent     Family History  Problem Relation Age of Onset  . Heart attack Father   . Emphysema Father   . Aneurysm Mother        CEREBRAL  . Emphysema Mother   . Dementia Mother   . Diabetes Son   . Hypertension Son   . Hypertension Son   . Cancer Sister        Widely Metastatic  . Asthma Son        x2  . Breast cancer Paternal Aunt   . Rheum arthritis Maternal Aunt     Past Surgical History:  Procedure Laterality Date  . A-FLUTTER  ABLATION N/A 08/16/2016   Procedure: A-Flutter Ablation;  Surgeon: Evans Lance, MD;  Location: [facility];  Service: Cardiovascular;  Laterality: N/A;  . APPENDECTOMY  01-25-11  . CARPAL TUNNEL RELEASE  01-25-11   Bil.  . CERVICAL SPINE SURGERY  01-25-11   2005-multiple levels  . CHOLECYSTECTOMY  01-25-11   '07  . COLONOSCOPY WITH PROPOFOL N/A 07/23/2012   Procedure: COLONOSCOPY WITH PROPOFOL;  Surgeon: Garlan Fair, MD;  [facility]: [facility];  Service: Endoscopy;  Laterality: N/A;  . detatched retna Bilateral   . EYE SURGERY    . JOINT REPLACEMENT  01-25-11   6'03 LTHA-hemi  . KNEE ARTHROPLASTY  01-25-11   '04-right, revised x2  . no gag reflex     Pt does not havea gag reflex following  siurgery to neck. Family unsure of date. Pt takes pill in apple sauce.  . SPINAL CORD STIMULATOR IMPLANT  2009 APPROX   DR. ELSNER  . THORACIC SPINE SURGERY  01-25-11   6'02- then nerve stimulator implanted after  . TOTAL KNEE REVISION  02/01/2011   Procedure: TOTAL KNEE  REVISION;  Surgeon: Mauri Pole;  [facility];  Service: Orthopedics;  Laterality: Right;  . TUBAL LIGATION     Social History   Occupational History  . Occupation: disabled  Tobacco Use  . Smoking status: Former Smoker    Packs/day: 0.75    Years: 50.00    Pack years: 37.50    Types: Cigarettes    Start date: 09/13/1961    Last attempt to quit: 05/2016    Years since quitting: 1.6  . Smokeless tobacco: Never Used  . Tobacco comment: Decreased Down to 2/3 a Day if Thinking Abouit It  Substance and Sexual Activity  . Alcohol use: No    Alcohol/week: 0.0 standard drinks    Comment: none in 10 years  . Drug use: No  . Sexual activity: Never

## 2018-01-03 ENCOUNTER — Ambulatory Visit (INDEPENDENT_AMBULATORY_CARE_PROVIDER_SITE_OTHER): Payer: Medicare Other | Admitting: Psychiatry

## 2018-01-03 ENCOUNTER — Encounter (HOSPITAL_COMMUNITY): Payer: Self-pay | Admitting: Psychiatry

## 2018-01-03 ENCOUNTER — Encounter

## 2018-01-03 VITALS — BP 140/76 | Ht 66.0 in | Wt 190.0 lb

## 2018-01-03 DIAGNOSIS — F0151 Vascular dementia with behavioral disturbance: Secondary | ICD-10-CM | POA: Diagnosis not present

## 2018-01-03 DIAGNOSIS — Z79899 Other long term (current) drug therapy: Secondary | ICD-10-CM

## 2018-01-03 DIAGNOSIS — F01518 Vascular dementia, unspecified severity, with other behavioral disturbance: Secondary | ICD-10-CM

## 2018-01-03 MED ORDER — METHYLPREDNISOLONE ACETATE 40 MG/ML IJ SUSP
40.0000 mg | INTRAMUSCULAR | Status: AC | PRN
  Administered 2018-01-02: 40 mg via INTRA_ARTICULAR

## 2018-01-03 MED ORDER — LIDOCAINE HCL 1 % IJ SOLN
0.5000 mL | INTRAMUSCULAR | Status: AC | PRN
  Administered 2018-01-02: .5 mL

## 2018-01-03 MED ORDER — TEMAZEPAM 30 MG PO CAPS: 30.0000 mg | ORAL_CAPSULE | Freq: Every evening | ORAL | 4 refills | Status: DC | PRN

## 2018-01-03 MED ORDER — BUPIVACAINE HCL 0.5 % IJ SOLN
3.0000 mL | INTRAMUSCULAR | Status: AC | PRN
  Administered 2018-01-02: 3 mL via INTRA_ARTICULAR

## 2018-01-03 NOTE — Addendum Note (Signed)
Addended by: Dennie Maizes E on: 01/03/2018 02:04 PM   Modules accepted: Orders

## 2018-01-03 NOTE — Progress Notes (Signed)
Psychiatric Initial Adult Assessment   Patient Identification: Destiny Hughes MRN:  [id number] Date of Evaluation:  01/03/2018 Referral Source: [facility] Chief Complaint:   Today the patient is seen with her husband and her son.  The closer evaluation is apparent the patient is irritable and aggressive only with her husband.  When she is with other people and other family member she is pleasant easy-going really is not agitated.  She takes 1000 mg of Depakote but unfortunately never came to get her blood work.  The patient denies any chest pain shortness of breath.  Denies any physical complaints.  Patient denies auditory or visual hallucinations and she is not paranoid.  Her mood seems to be generally stable.  She does all of her basic ADLs.  She feeds herself dresses herself and bathe herself.  She does not of the instrumental ADLs.  Her husband is very involved in her care.  The patient's son and daughter-in-law also are very involved.  [facility] and for reasons that are not clear stopped going.  Today we talked to [facility] and they said they will be calling the family back to give her a new location to receive care.  Therefore we coordinated her care with [facility] which is a daycare program.  Overall the patient is sleeping fairly well.  She eats very well.  Her short-term memory is clearly impacted.  The patient is not suicidal makes no attempts to elope. (Hypo) Manic Symptoms:   Anxiety Symptoms:   Psychotic Symptoms:   PTSD Symptoms:   Past Psychiatric History: none  Previous Psychotropic Medications: Yes   Substance Abuse History in the last 12 months:  No.  Consequences of Substance Abuse:   Past Medical History:  Past Medical History:  Diagnosis Date  . Chronic back pain   . Cognitive retention disorder   . Collapse of right lung   . COPD (chronic obstructive pulmonary disease) (HCC)    Hx.  Bronchitis occ, 01-25-11 somes issue now taking  Z-pak-started 01-24-11/Scar tissue  present  from previous lung collapse  . Dementia with psychosis ([place])   . Depression   . Depression   . GERD (gastroesophageal reflux disease)    tx. TUMS  . Glaucoma   . Hyperlipemia   . Hypothyroidism    tx. Levothyroxine  . Major depression with psychotic features ([place])   . Nephrolithiasis   . Neuromuscular disorder ([place]) 01-25-11   Pain/nerve stimulator implanted -2 yrs ago.  Marland Kitchen Reflex, gag, absent   . Reflex, gag, absent     Past Surgical History:  Procedure Laterality Date  . A-FLUTTER ABLATION N/A 08/16/2016   Procedure: A-Flutter Ablation;  Surgeon: Evans Lance, MD;  Location: [facility];  Service: Cardiovascular;  Laterality: N/A;  . APPENDECTOMY  01-25-11  . CARPAL TUNNEL RELEASE  01-25-11   Bil.  . CERVICAL SPINE SURGERY  01-25-11   2005-multiple levels  . CHOLECYSTECTOMY  01-25-11   '07  . COLONOSCOPY WITH PROPOFOL N/A 07/23/2012   Procedure: COLONOSCOPY WITH PROPOFOL;  Surgeon: Garlan Fair, MD;  [facility];  Service: Endoscopy;  Laterality: N/A;  . detatched retna Bilateral   . EYE SURGERY    . JOINT REPLACEMENT  01-25-11   6'03 LTHA-hemi  . KNEE ARTHROPLASTY  01-25-11   '04-right, revised x2  . no gag reflex     Pt does not havea gag reflex following  siurgery to neck.  Family unsure of date. Pt takes pill in apple sauce.  . SPINAL CORD STIMULATOR IMPLANT  2009 APPROX   DR. ELSNER  . THORACIC SPINE SURGERY  01-25-11   6'02- then nerve stimulator implanted after  . TOTAL KNEE REVISION  02/01/2011   Procedure: TOTAL KNEE REVISION;  Surgeon: Mauri Pole;  [facility];  Service: Orthopedics;  Laterality: Right;  . TUBAL LIGATION      Family Psychiatric History:   Family History:  Family History  Problem Relation Age of Onset  . Heart attack Father   . Emphysema Father   . Aneurysm Mother        CEREBRAL  . Emphysema Mother   . Dementia Mother    . Diabetes Son   . Hypertension Son   . Hypertension Son   . Cancer Sister        Widely Metastatic  . Asthma Son        x2  . Breast cancer Paternal Aunt   . Rheum arthritis Maternal Aunt     Social History:   Social History   Socioeconomic History  . Marital status: Married    Spouse name: Not on file  . Number of children: 2  . Years of education: Not on file  . Highest education level: Not on file  Occupational History  . Occupation: disabled  Social Needs  . Financial resource strain: Not on file  . Food insecurity:    Worry: Not on file    Inability: Not on file  . Transportation needs:    Medical: Not on file    Non-medical: Not on file  Tobacco Use  . Smoking status: Former Smoker    Packs/day: 0.75    Years: 50.00    Pack years: 37.50    Types: Cigarettes    Start date: 09/13/1961    Last attempt to quit: 05/2016    Years since quitting: 1.6  . Smokeless tobacco: Never Used  . Tobacco comment: Decreased Down to 2/3 a Day if Thinking Abouit It  Substance and Sexual Activity  . Alcohol use: No    Alcohol/week: 0.0 standard drinks    Comment: none in 10 years  . Drug use: No  . Sexual activity: Never  Lifestyle  . Physical activity:    Days per week: Not on file    Minutes per session: Not on file  . Stress: Not on file  Relationships  . Social connections:    Talks on phone: Not on file    Gets together: Not on file    Attends religious service: Not on file    Active member of club or organization: Not on file    Attends meetings of clubs or organizations: Not on file    Relationship status: Not on file  Other Topics Concern  . Not on file  Social History Narrative   She is from [facility]. She has always lived in [place]. She has traveled to [place], [place], [place], [place], & [place]. 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 Prior exposure to parakeets with  last exposure remote in her current home. Previously had mold in her home that was in her bathroom & fixed. Prior exposure to hot tubs but none recently. Pt lives at home with [name], husband.  Has 2 childrean, 12th grade education.      Additional Social History:  Allergies:   Allergies  Allergen Reactions  . Albuterol Shortness Of Breath and Other (See Comments)  . Penicillins Anaphylaxis, Swelling and Other (See Comments)    Has patient had a PCN reaction causing immediate rash, facial/tongue/throat swelling, SOB or lightheadedness with hypotension: Yes Has patient had a PCN reaction causing severe rash involving mucus membranes or skin necrosis: Rash Has patient had a PCN reaction that required hospitalization: No Has patient had a PCN reaction occurring within the last 10 years: No If all of the above answers are "NO", then may proceed with Cephalosporin use.  . Sulfa Antibiotics Swelling and Other (See Comments)    Throat swells, but no shortness of breath noted  . Zanaflex [Tizanidine] Other (See Comments)    Possible confusion  . Codeine Nausea And Vomiting  . Ecotrin [Aspirin] Nausea And Vomiting    Metabolic Disorder Labs: Lab Results  Component Value Date   HGBA1C 6.4 (H) 04/28/2015   MPG 137 (H) 04/28/2015   MPG 134 (H) 05/21/2014   No results found for: PROLACTIN Lab Results  Component Value Date   CHOL 195 04/28/2015   TRIG 188 (H) 04/28/2015   HDL 43 (L) 04/28/2015   CHOLHDL 4.5 04/28/2015   VLDL 38 (H) 04/28/2015   LDLCALC 114 04/28/2015   LDLCALC 114 04/28/2015     Current Medications: Current Outpatient Medications  Medication Sig Dispense Refill  . acetaminophen (TYLENOL) 325 MG tablet Take 650 mg by mouth every 4 (four) hours as needed.    Marland Kitchen amLODipine (NORVASC) 5 MG tablet TAKE ONE TABLET DAILY 90 tablet 1  . aspirin EC 81 MG tablet Take 81 mg by mouth at bedtime.    . divalproex (DEPAKOTE ER) 500 MG 24 hr tablet 2  qam (Patient taking  differently: Take 500 mg by mouth daily. ) 60 tablet 3  . donepezil (ARICEPT) 10 MG tablet 1  qhs (Patient taking differently: Take 10 mg by mouth at bedtime. ) 30 tablet 0  . fexofenadine (ALLEGRA) 180 MG tablet Take 180 mg by mouth daily.    . furosemide (LASIX) 20 MG tablet TAKE ONE TABLET DAILY 30 tablet 0  . gabapentin (NEURONTIN) 100 MG capsule TAKE ONE CAPSULE TWICE A DAY 180 capsule 2  . levalbuterol (XOPENEX) 0.63 MG/3ML nebulizer solution Take 3 mLs (0.63 mg total) by nebulization every 4 (four) hours as needed for wheezing or shortness of breath. 360 mL 5  . levothyroxine (SYNTHROID, LEVOTHROID) 125 MCG tablet TAKE ONE TABLET DAILY BEFORE BREAKFAST (Patient taking differently: Take 125 mcg by mouth at bedtime. ) 90 tablet 0  . lisinopril (PRINIVIL,ZESTRIL) 10 MG tablet Take 10 mg by mouth daily.    . Melatonin 10 MG CAPS Take 10 mg by mouth at bedtime.    . memantine (NAMENDA) 5 MG tablet TAKE ONE TABLET DAILY 90 tablet 3  . pantoprazole (PROTONIX) 40 MG tablet Take 40 mg by mouth daily.    . propranolol (INDERAL) 80 MG tablet TAKE ONE-HALF TABLET 3 TIMES DAILY (Patient taking differently: Take 40 mg by mouth 3 (three) times daily. ) 135 tablet 3  . sodium chloride 1 g tablet TAKE ONE TABLET THREE TIMES DAILY 180 tablet 0  . temazepam (RESTORIL) 30 MG capsule Take 1 capsule (30 mg total) by mouth at bedtime as needed for sleep. 30 capsule 0  . Tiotropium Bromide-Olodaterol (STIOLTO RESPIMAT) 2.5-2.5 MCG/ACT AERS Inhale 2 puffs into the lungs daily. 2 Inhaler 0  . trolamine salicylate (ASPERCREME) 10 % cream Apply  1 application topically as needed for muscle pain.    . magnesium oxide (MAG-OX) 400 MG tablet Take 1 tablet (400 mg total) by mouth daily. (Patient not taking: Reported on 12/28/2017) 30 tablet 3   No current facility-administered medications for this visit.     Neurologic: Headache: No Seizure: No Paresthesias:No  Musculoskeletal: Strength & Muscle Tone:  decreased Gait & Station: unsteady Patient leans: N/A  Psychiatric Specialty Exam: ROS  Blood pressure 140/76, height 5\' 6"  (1.676 m), weight 190 lb (86.2 kg).Body mass index is 30.67 kg/m.  General Appearance: Casual  Eye Contact:  Good  Speech:  Clear and Coherent  Volume:  Normal  Mood:  NA  Affect:  depressed  Thought Process:  Goal Directed  Orientation:  Full (Time, Place, and Person)  Thought Content:  WDL  Suicidal Thoughts:  No  Homicidal Thoughts:  No  Memory:  Immediate;   Poor  Judgement:  Fair  Insight:  Lacking  Psychomotor Activity:  Normal  Concentration:    Recall:  Poor  Fund of Knowledge:Poor  Language: Fair  Akathisia:  No  Handed:  Right  AIMS (if indicated):   Assets:  Communication Skills  ADL's:  Impaired  Cognition: Impaired,  Moderate  Sleep:      Treatment Plan Summary: At this time patient is #1 problem of memory disorder.  Most likely that Alzheimer's.  At this time she will continue taking Namenda.  We will also make efforts to get her into a day treatment program at [facility] but a different one.  Her second problem is that of agitation which is related to her memory impairment.  At this time she is taking Depakote 1000 mg.  The pill is very large and have instructed them to cut it in half to give her 4 hands equaling a total of 1000.  Today the patient will get a Depakote level that was not done at the appropriate time will make sure that it is not too high.  Her third problem is sleep issues.  She will continue taking Restoril and continues taking melatonin.  Overall the patient is fairly stable.  She is compliant she is cooperative.  Noted again as her agitation is directly related to her husband.  She is never really been violent.

## 2018-01-04 LAB — COMPREHENSIVE METABOLIC PANEL
ALBUMIN: 4.2 g/dL (ref 3.5–4.8)
ALT: 8 IU/L (ref 0–32)
AST: 11 IU/L (ref 0–40)
Albumin/Globulin Ratio: 1.4 (ref 1.2–2.2)
Alkaline Phosphatase: 82 IU/L (ref 39–117)
BUN/Creatinine Ratio: 16 (ref 12–28)
BUN: 14 mg/dL (ref 8–27)
CALCIUM: 9 mg/dL (ref 8.7–10.3)
CHLORIDE: 84 mmol/L — AB (ref 96–106)
CO2: 26 mmol/L (ref 20–29)
Creatinine, Ser: 0.9 mg/dL (ref 0.57–1.00)
GFR, EST AFRICAN AMERICAN: 73 mL/min/{1.73_m2} (ref >59)
GFR, EST NON AFRICAN AMERICAN: 63 mL/min/{1.73_m2} (ref >59)
GLUCOSE: 121 mg/dL — AB (ref 65–99)
Globulin, Total: 3 g/dL (ref 1.5–4.5)
Potassium: 4.9 mmol/L (ref 3.5–5.2)
Sodium: 126 mmol/L — ABNORMAL LOW (ref 134–144)
TOTAL PROTEIN: 7.2 g/dL (ref 6.0–8.5)

## 2018-01-04 LAB — VALPROIC ACID LEVEL: Valproic Acid Lvl: 70 ug/mL (ref 50–100)

## 2018-01-04 NOTE — Pre-Procedure Instructions (Signed)
Jolane Bankhead Goh  01/04/2018      [facility] - [place], [place] - [address] [place] [address] Phone: [phone] Fax: [phone]    Your procedure is scheduled on Friday October 25.  Report to [facility] Admitting at 9:30 A.M.  Call this number if you have problems the morning of surgery:  [phone]   Remember:  Do not eat or drink after midnight.    Take these medicines the morning of surgery with A SIP OF WATER:   Amlodipine (norvasc) Propranolol (Inderal) Divalproex (Depakote) Pantoprazole (protonix) Fexofenadine (Allegra) Gabapentin (neurontin) Memantine (Namenda) Stiolto Respimat Xopenex if needed  Acetaminophen (tylenol) if needed  7 days prior to surgery STOP taking any  Aleve, Naproxen, Ibuprofen, Motrin, Advil, Goody's, BC's, all herbal medications, fish oil, and all vitamins  FOLLOW your surgeon's instructions on stopping Aspirin.     Do not wear jewelry, make-up or nail polish.  Do not wear lotions, powders, or perfumes, or deodorant.  Do not shave 48 hours prior to surgery.  Men may shave face and neck.  Do not bring valuables to the hospital.  [facility] is not responsible for any belongings or valuables.  Contacts, dentures or bridgework may not be worn into surgery.  Leave your suitcase in the car.  After surgery it may be brought to your room.  For patients admitted to the hospital, discharge time will be determined by your treatment team.  Patients discharged the day of surgery will not be allowed to drive home.    Special instructions:    [facility]- Preparing For Surgery  Before surgery, you can play an important role. Because skin is not sterile, your skin needs to be as free of germs as possible. You can reduce the number of germs on your skin by washing with CHG (chlorahexidine gluconate) Soap before surgery.  CHG is an antiseptic cleaner which kills germs and bonds with the skin to  continue killing germs even after washing.    Oral Hygiene is also important to reduce your risk of infection.  Remember - BRUSH YOUR TEETH THE MORNING OF SURGERY WITH YOUR REGULAR TOOTHPASTE  Please do not use if you have an allergy to CHG or antibacterial soaps. If your skin becomes reddened/irritated stop using the CHG.  Do not shave (including legs and underarms) for at least 48 hours prior to first CHG shower. It is OK to shave your face.  Please follow these instructions carefully.   1. Shower the NIGHT BEFORE SURGERY and the MORNING OF SURGERY with CHG.   2. If you chose to wash your hair, wash your hair first as usual with your normal shampoo.  3. After you shampoo, rinse your hair and body thoroughly to remove the shampoo.  4. Use CHG as you would any other liquid soap. You can apply CHG directly to the skin and wash gently with a scrungie or a clean washcloth.   5. Apply the CHG Soap to your body ONLY FROM THE NECK DOWN.  Do not use on open wounds or open sores. Avoid contact with your eyes, ears, mouth and genitals (private parts). Wash Face and genitals (private parts)  with your normal soap.  6. Wash thoroughly, paying special attention to the area where your surgery will be performed.  7. Thoroughly rinse your body with warm water from the neck down.  8. DO NOT shower/wash with your normal soap after using and rinsing off  the CHG Soap.  9. Pat yourself dry with a CLEAN TOWEL.  10. Wear CLEAN PAJAMAS to bed the night before surgery, wear comfortable clothes the morning of surgery  11. Place CLEAN SHEETS on your bed the night of your first shower and DO NOT SLEEP WITH PETS.    Day of Surgery:  Do not apply any deodorants/lotions.  Please wear clean clothes to the hospital/surgery center.   Remember to brush your teeth WITH YOUR REGULAR TOOTHPASTE.    Please read over the following fact sheets that you were given. Coughing and Deep Breathing and Surgical Site  Infection Prevention

## 2018-01-05 ENCOUNTER — Encounter (HOSPITAL_COMMUNITY): Payer: Self-pay

## 2018-01-05 ENCOUNTER — Telehealth: Payer: Self-pay | Admitting: Emergency Medicine

## 2018-01-05 ENCOUNTER — Other Ambulatory Visit: Payer: Self-pay

## 2018-01-05 ENCOUNTER — Encounter (HOSPITAL_COMMUNITY)
Admission: RE | Admit: 2018-01-05 | Discharge: 2018-01-05 | Disposition: A | Discharge status: Home / Self Care | Payer: Medicare Other | Source: Ambulatory Visit | Attending: Anesthesiology | Admitting: Anesthesiology

## 2018-01-05 DIAGNOSIS — Z79899 Other long term (current) drug therapy: Secondary | ICD-10-CM | POA: Insufficient documentation

## 2018-01-05 DIAGNOSIS — Z79890 Hormone replacement therapy (postmenopausal): Secondary | ICD-10-CM | POA: Insufficient documentation

## 2018-01-05 DIAGNOSIS — E039 Hypothyroidism, unspecified: Secondary | ICD-10-CM | POA: Insufficient documentation

## 2018-01-05 DIAGNOSIS — E785 Hyperlipidemia, unspecified: Secondary | ICD-10-CM | POA: Diagnosis not present

## 2018-01-05 DIAGNOSIS — R413 Other amnesia: Secondary | ICD-10-CM | POA: Diagnosis not present

## 2018-01-05 DIAGNOSIS — Z7982 Long term (current) use of aspirin: Secondary | ICD-10-CM | POA: Diagnosis not present

## 2018-01-05 DIAGNOSIS — J449 Chronic obstructive pulmonary disease, unspecified: Secondary | ICD-10-CM | POA: Diagnosis not present

## 2018-01-05 DIAGNOSIS — Z87891 Personal history of nicotine dependence: Secondary | ICD-10-CM | POA: Insufficient documentation

## 2018-01-05 DIAGNOSIS — Z01812 Encounter for preprocedural laboratory examination: Secondary | ICD-10-CM | POA: Diagnosis not present

## 2018-01-05 HISTORY — DX: Headache, unspecified: R51.9

## 2018-01-05 HISTORY — DX: Headache: R51

## 2018-01-05 HISTORY — DX: Personal history of urinary calculi: Z87.442

## 2018-01-05 HISTORY — DX: Essential (primary) hypertension: I10

## 2018-01-05 LAB — SURGICAL PCR SCREEN
MRSA, PCR: NEGATIVE
STAPHYLOCOCCUS AUREUS: NEGATIVE

## 2018-01-05 LAB — CBC
HCT: 39 % (ref 36.0–46.0)
HEMOGLOBIN: 12.3 g/dL (ref 12.0–15.0)
MCH: 26.4 pg (ref 26.0–34.0)
MCHC: 31.5 g/dL (ref 30.0–36.0)
MCV: 83.7 fL (ref 80.0–100.0)
PLATELETS: 438 10*3/uL — AB (ref 150–400)
RBC: 4.66 MIL/uL (ref 3.87–5.11)
RDW: 13.7 % (ref 11.5–15.5)
WBC: 6.7 10*3/uL (ref 4.0–10.5)
nRBC: 0 % (ref 0.0–0.2)

## 2018-01-05 MED ORDER — TIOTROPIUM BROMIDE-OLODATEROL 2.5-2.5 MCG/ACT IN AERS: 2.0000 | INHALATION_SPRAY | Freq: Every day | RESPIRATORY_TRACT | 1 refills | Status: DC

## 2018-01-05 NOTE — Telephone Encounter (Signed)
Rx has sent to pharmacy and husband is aware. Nothing more needed at this time.

## 2018-01-05 NOTE — Progress Notes (Signed)
Seen for PAT appt., chart will be referred to anesth. Consult due to lab values, respiratory status & cardiac history. Pt. Denies all chest complaints today.  Pt. Didn't have any distress in breathing or coughing throughout this visit.  Her responses Destiny Hughes are appropriate to all questions. Husband & other family member present for visit ; Son, & daughter in law.

## 2018-01-06 ENCOUNTER — Other Ambulatory Visit: Payer: Self-pay | Admitting: Family Medicine

## 2018-01-08 ENCOUNTER — Telehealth: Payer: Self-pay | Admitting: Emergency Medicine

## 2018-01-08 MED ORDER — TIOTROPIUM BROMIDE-OLODATEROL 2.5-2.5 MCG/ACT IN AERS: 2.0000 | INHALATION_SPRAY | Freq: Every day | RESPIRATORY_TRACT | 2 refills | Status: DC

## 2018-01-08 NOTE — Telephone Encounter (Signed)
Called and spoke to pt's spouse, Destiny Hughes ([place]). Destiny Hughes is requesting Rx for Stiolto to be sent to [facility].  Rx for stiolto has been sent to preferred pharmacy. Nothing further is needed.

## 2018-01-08 NOTE — Progress Notes (Addendum)
Anesthesia Chart Review:  Case:  [id number] Date/Time:  01/12/18 1115   Procedure:  Spinal cord stimulator battery replacement (N/A ) - Spinal cord stimulator battery replacement   Anesthesia type:  General   Pre-op diagnosis:  Battery end of life of spinal cord stimulator   Location:  [facility] OR ROOM 72 / [facility]   Surgeon:  Clydell Hakim, MD      DISCUSSION: Patient is a 72 year old female scheduled for the above procedure.  History includes COPD (severe), former smoker (quit 05/19/16), HTN, afib/flutter (had been on b-blocker and digoxin for palpitations, but discontinued ~ 07/01/16 for bradycardia, readmit 07/11/16 with SVT/aflutter; s/p ablation 08/16/16), HLD, hypothyroidism, glaucoma, GERD, dementia/cognitive retention disorder, major depression with psychotic features, abnormal ("absent" gag reflex), chronic pain syndrome, spinal cord stimulator '09.    Per Cecilie Kicks, NP ([facility]) on 12/26/17:  "...Kathlynn Grate Siguenza has done well with no further a flutter.  She has no chest pain.  Does have DOE related to her COPD.  Hx of negative stress test.  Therefore, based on ACC/AHA guidelines, the patient would be at acceptable risk for the planned procedure without further cardiovascular testing."   Patient with history of severe COPD. No acute pulmonary symptoms noted during PAT visit by PAT RN.   She had a CMET 01/03/18 per her psychiatrist.  Sodium 126, chloride 84. (Previously 133/93 11/24/17, 130/91 11/15/17, 131/93 08/16/17.) She is on sodium chloride tablets (has been for years per Mr. Waszak; started during [place] admission when her kidneys were almost "failing"). Contributing medications could include Lasix and Depakote. I don't see any specific recommendations following 01/03/18 labs, but she is sodium chloride tablets per her PCP (which appear to have been started earlier this year for hyponatremia).    Reviewed above with anesthesiologist Roberts Gaudy,  MD. Would recommend PCP input regarding worsening hyponatremia and would want labs repeated as well prior to surgery. I confirmed with patient's husband Sanoe Hazan that she continues to take NaCl 1 gram TID. He says her BLE edema is somewhat better and breathing has been much better on Stioloto (can hopefully pick up refill on 01/09/18). I have left a voice message with Raquel Sarna at Dr. Maryjean Ka' office and sent a staff message to Dr. Volanda Napoleon. Will follow-up for recommendations.  ADDENDUM 01/09/18 3:16 PM: Dr. Volanda Napoleon aware of labs. Initially had requested labs be repeated at PCP office, but spoke with nurse Apolonio Schneiders who now asked that labs be drawn at PAT. Our scheduler will arrange. I also spoke with Dr. Maryjean Ka. He is okay proceeding with current labs, but reviewed Dr. Verneda Skill recommendations with him. Will await follow-up BMET results.   ADDENDUM 01/10/18 12:31 PM: Patient came in for repeat labs this morning. Still taking NaCl as prescribed. Feels in her usual state of health, although reports mild cough for several months. Has an occasional wheeze. Edema has been better on Lasix and keeping legs elevated when possible. She is out of Stioloto samples and learned it is not covered by her insurance. They are picking up another sample today while waiting on new recommendations from Dr. [name]. She felt it was helping her breathing quite a bit. Heart RRR on exam, no murmur noted. Lungs diminished but no wheezes or rhonchi noted. She is in a hospital wheelchair. Ankles are large but no pitting edema. Unfortunately, Na now 124 and Cl 82. K 5.0, Cr 0.85. Results called to nurse Izora Gala with Dr. Volanda Napoleon. Dr. Volanda Napoleon recommended evaluation in the ED.  I have notified Mr. Joa and called the [facility] ED to alert them of patient's arrival arrival this afternoon. Chart will be left for follow-up.  ADDENDUM 01/11/18 11:59 PM: Recheck Na/Cl 126/85 in ED. Lisinopril was held, and patient was discharged home with  recommendation for PCP follow-up. Reviewed repeat lab results with anesthesiologist Suella Broad, MD. Will have patient arrive earlier (2 1/2 hours, instead of 2 hours) to repeat STAT BMET. Definitive anesthesia plan pending of results. If worsening hyponatremia case would likely be cancelled or at least delayed until treatment measures carried out. I have notified Mr. Fife and also RN Lakea at Dr. Maryjean Ka' office of this. I asked Mr. Stout to let Dr. Maryjean Ka' staff know if any additional recommendations from Dr. Volanda Napoleon that would pertain to upcoming surgery. He will schedule a follow-up appointment for his wife with Dr. Volanda Napoleon in the near future.    VS: BP (!) 123/59   Pulse 71   Temp 36.7 C   Resp 20   Ht 5' 5.5" (1.664 m)   Wt 85.8 kg   SpO2 96%   BMI 31.01 kg/m   PROVIDERS: Billie Ruddy, MD is PCP. Last visit 11/15/17.  Cristopher Peru, MD is EP cardiologist Last visit with Chanetta Marshall, NP on 10/20/17. (Previously had been seeing Skeet Latch, MD for primary cardiology, but has primarily been seeing EP since ablation 2018.) She had no recurrent SVT. Her [place] were previously discontinued per Dr. Lovena Le. She was referred back to pulmonology.Baltazar Apo, MD is pulmonologist. Last visit 12/05/17 for COPD follow-up. At that time, not on a stable bronchodilator regiment in large part due to difficulty tolerating or being able to afford different options. She was started on Stioloto 2 puffs daily and Atrovent replaced with Xopenex nebs PRN (although it looks like Xopenex hasn't been approved by insurance yet). Two month follow-up recommended. Andrey Spearman, MD is neurologist. Last visit 08/22/16.  Norma Fredrickson, MD is psychiatrist. Last Progress Note dated 01/03/18.     LABS: Most recent labs include: Lab Results  Component Value Date   WBC 6.7 01/05/2018   HGB 12.3 01/05/2018   HCT 39.0 01/05/2018   PLT 438 (H) 01/05/2018   GLUCOSE 121 (H) 01/03/2018   ALT 8  01/03/2018   AST 11 01/03/2018   NA 126 (L) 01/03/2018   K 4.9 01/03/2018   CL 84 (L) 01/03/2018   CREATININE 0.90 01/03/2018   BUN 14 01/03/2018   CO2 26 01/03/2018    PFT: - 08/27/15: FVC 1.09 L (34%) FEV1 0.75 L (31%) FEV1/FVC 0.69 FEF 25-75 0.49 L (25%). DLCO uncorrected 42%.  - 12/26/14: FVC 1.23 L (38%) FEV1 0.78 L (32%) FEV1/FVC 0.64 FEF 25-75 0.43 L (21%) TLC 4.96 L (92%) RV 128% ERV 27% DLCO uncorrected 51%   EKG: 04/18/17: SR with marked sinus arrhythmia with first degree AV block. Anterior infarct (age undetermined).   CV: TTE 11/05/16 ([place]): Summary:  The left ventricle is normal in size. There is no thrombus. There is normal left ventricular wall thickness. Left ventricular systolic function is normal. Ejection Fraction = >55%. No regional wall motion abnormalities noted.  The left atrial size is normal. Right atrial size is normal. The interatrial septum is intact with no evidence for an atrial septal defect. Trace mitral regurgitation. Mild tricuspid regurgitation. Right ventricular systolic pressure is elevated at 30- 50mmHg. (Comparison TTE 09/01/15: LVEF 60-65%, normal wall thickness, normal wall motion, stage 2 DD with elevated  LV filling pressure, mild MR, normal LA size, mild RAE, dilated RV with reduced systolic function, dilated IVC, no TR.)  TEE 08/16/16: Study Conclusions - Aortic valve: No evidence of vegetation. - Mitral valve: There was mild regurgitation. - Left atrium: No evidence of thrombus in the atrial cavity or   appendage. - Tricuspid valve: No evidence of vegetation.   EP study/radiofrequency catheter ablation 08/16/16:    CONCLUSIONS:  1. Isthmus-dependent right atrial flutter upon presentation.  2. Successful radiofrequency ablation of atrial flutter along the cavotricuspid isthmus with complete bidirectional isthmus block achieved.  3. Successful catheter ablation of inducible AVNRT 4. No inducible arrhythmias following  ablation.  5. The patient had transient AV block after the ablation which resolved and did not return.  21 day Event monitor 07/2016: Atrial flutter and atrial fibrillation noted PVCs  Carotid U/S 05/13/16: Impressions: Heterogeneous plaque, bilaterally. Stable 1-39% bilateral ICA stenosis. Normal subclavian arteries, bilaterally. Patent vertebral arteries with antegrade flow.  Nuclear stress test 09/08/15:  Nuclear stress EF: 62%.  There was no ST segment deviation noted during stress.  The study is normal.  This is a low risk study.  The left ventricular ejection fraction is normal (55-65%).   Past Medical History:  Diagnosis Date  . Arthritis    OA- knees & back  neck  . Chronic back pain   . Cognitive retention disorder   . Collapse of right lung   . COPD (chronic obstructive pulmonary disease) (HCC)    Hx. Bronchitis occ, 01-25-11 somes issue now taking  Z-pak-started 01-24-11/Scar tissue  present  from previous lung collapse  . Dementia with psychosis ([place])   . Depression   . Depression   . Dyspnea   . Dysrhythmia 2018   treated with Ablation   . GERD (gastroesophageal reflux disease)    tx. TUMS  . Glaucoma   . Headache   . History of kidney stones    surgically treated  . Hyperlipemia   . Hypertension   . Hypothyroidism    tx. Levothyroxine  . Major depression with psychotic features ([place])   . Nephrolithiasis   . Neuromuscular disorder ([place]) 01-25-11   Pain/nerve stimulator implanted -2 yrs ago.  Marland Kitchen Reflex, gag, absent   . Reflex, gag, absent     Past Surgical History:  Procedure Laterality Date  . A-FLUTTER ABLATION N/A 08/16/2016   Procedure: A-Flutter Ablation;  Surgeon: Evans Lance, MD;  Location: [facility];  Service: Cardiovascular;  Laterality: N/A;  . ANTERIOR CERVICAL DECOMP/DISCECTOMY FUSION     3 levels, per family  . APPENDECTOMY  01-25-11  . CARPAL TUNNEL RELEASE  01-25-11   [place].  . CERVICAL SPINE SURGERY  01-25-11    2005-multiple levels  . CHOLECYSTECTOMY  01-25-11   '07  . COLONOSCOPY WITH PROPOFOL N/A 07/23/2012   Procedure: COLONOSCOPY WITH PROPOFOL;  Surgeon: Garlan Fair, MD;  Location: [facility];  Service: Endoscopy;  Laterality: N/A;  . detatched retna Bilateral    done July & August- 2019  . EYE SURGERY Bilateral 2019   repair of retina detachment- Dr. Zadie Rhine at surgical center   . JOINT REPLACEMENT  01-25-11   6'03 LTHA-hemi  . KNEE ARTHROPLASTY  01-25-11   '04-right, revised x2  . no gag reflex     Pt does not havea gag reflex following  siurgery to neck. Family unsure of date. Pt takes pill in apple sauce.  . SPINAL CORD STIMULATOR IMPLANT  2009 APPROX  DR. Ellene Route  . THORACIC SPINE SURGERY  01-25-11   6'02- then nerve stimulator implanted after  . TOTAL KNEE REVISION  02/01/2011   Procedure: TOTAL KNEE REVISION;  Surgeon: Mauri Pole;  Location: [facility];  Service: Orthopedics;  Laterality: Right;  . TUBAL LIGATION      MEDICATIONS: . acetaminophen (TYLENOL) 325 MG tablet  . amLODipine (NORVASC) 5 MG tablet  . aspirin EC 81 MG tablet  . divalproex (DEPAKOTE ER) 500 MG 24 hr tablet  . donepezil (ARICEPT) 10 MG tablet  . fexofenadine (ALLEGRA) 180 MG tablet  . furosemide (LASIX) 20 MG tablet  . gabapentin (NEURONTIN) 100 MG capsule  . levalbuterol (XOPENEX) 0.63 MG/3ML nebulizer solution  . levothyroxine (SYNTHROID, LEVOTHROID) 125 MCG tablet  . lisinopril (PRINIVIL,ZESTRIL) 10 MG tablet  . magnesium oxide (MAG-OX) 400 MG tablet  . Melatonin 10 MG CAPS  . memantine (NAMENDA) 5 MG tablet  . pantoprazole (PROTONIX) 40 MG tablet  . propranolol (INDERAL) 80 MG tablet  . sodium chloride 1 g tablet  . temazepam (RESTORIL) 30 MG capsule  . Tiotropium Bromide-Olodaterol (STIOLTO RESPIMAT) 2.5-2.5 MCG/ACT AERS  . trolamine salicylate (ASPERCREME) 10 % cream   No current facility-administered medications for this encounter.   Patient is not taking magnesium.    [facility] Stay [facility]/Anesthesiology Phone [phone] 01/08/2018 5:10 PM

## 2018-01-10 ENCOUNTER — Ambulatory Visit (INDEPENDENT_AMBULATORY_CARE_PROVIDER_SITE_OTHER): Payer: Medicare Other | Admitting: Surgery

## 2018-01-10 ENCOUNTER — Other Ambulatory Visit: Payer: Self-pay

## 2018-01-10 ENCOUNTER — Encounter (HOSPITAL_COMMUNITY): Payer: Self-pay

## 2018-01-10 ENCOUNTER — Telehealth: Payer: Self-pay | Admitting: Emergency Medicine

## 2018-01-10 ENCOUNTER — Encounter (HOSPITAL_COMMUNITY)
Admission: RE | Admit: 2018-01-10 | Discharge: 2018-01-10 | Disposition: A | Discharge status: Home / Self Care | Payer: Medicare Other | Source: Ambulatory Visit | Attending: Anesthesiology | Admitting: Anesthesiology

## 2018-01-10 ENCOUNTER — Emergency Department (HOSPITAL_COMMUNITY)
Admission: EM | Admit: 2018-01-10 | Discharge: 2018-01-10 | Disposition: A | Discharge status: Home / Self Care | Payer: Medicare Other | Attending: Emergency Medicine | Admitting: Emergency Medicine

## 2018-01-10 DIAGNOSIS — F329 Major depressive disorder, single episode, unspecified: Secondary | ICD-10-CM | POA: Diagnosis not present

## 2018-01-10 DIAGNOSIS — Z79890 Hormone replacement therapy (postmenopausal): Secondary | ICD-10-CM | POA: Diagnosis not present

## 2018-01-10 DIAGNOSIS — M961 Postlaminectomy syndrome, not elsewhere classified: Secondary | ICD-10-CM | POA: Diagnosis not present

## 2018-01-10 DIAGNOSIS — K219 Gastro-esophageal reflux disease without esophagitis: Secondary | ICD-10-CM | POA: Diagnosis not present

## 2018-01-10 DIAGNOSIS — Z87891 Personal history of nicotine dependence: Secondary | ICD-10-CM | POA: Insufficient documentation

## 2018-01-10 DIAGNOSIS — Z79899 Other long term (current) drug therapy: Secondary | ICD-10-CM | POA: Diagnosis not present

## 2018-01-10 DIAGNOSIS — Z9682 Presence of neurostimulator: Secondary | ICD-10-CM | POA: Diagnosis not present

## 2018-01-10 DIAGNOSIS — Z7982 Long term (current) use of aspirin: Secondary | ICD-10-CM | POA: Diagnosis not present

## 2018-01-10 DIAGNOSIS — M5416 Radiculopathy, lumbar region: Secondary | ICD-10-CM | POA: Diagnosis not present

## 2018-01-10 DIAGNOSIS — I5030 Unspecified diastolic (congestive) heart failure: Secondary | ICD-10-CM | POA: Diagnosis not present

## 2018-01-10 DIAGNOSIS — Z886 Allergy status to analgesic agent status: Secondary | ICD-10-CM | POA: Diagnosis not present

## 2018-01-10 DIAGNOSIS — I110 Hypertensive heart disease with heart failure: Secondary | ICD-10-CM | POA: Insufficient documentation

## 2018-01-10 DIAGNOSIS — F0391 Unspecified dementia with behavioral disturbance: Secondary | ICD-10-CM | POA: Diagnosis not present

## 2018-01-10 DIAGNOSIS — M1990 Unspecified osteoarthritis, unspecified site: Secondary | ICD-10-CM | POA: Diagnosis not present

## 2018-01-10 DIAGNOSIS — G8929 Other chronic pain: Secondary | ICD-10-CM | POA: Diagnosis not present

## 2018-01-10 DIAGNOSIS — J449 Chronic obstructive pulmonary disease, unspecified: Secondary | ICD-10-CM | POA: Diagnosis not present

## 2018-01-10 DIAGNOSIS — E039 Hypothyroidism, unspecified: Secondary | ICD-10-CM | POA: Insufficient documentation

## 2018-01-10 DIAGNOSIS — I10 Essential (primary) hypertension: Secondary | ICD-10-CM | POA: Diagnosis not present

## 2018-01-10 DIAGNOSIS — Z882 Allergy status to sulfonamides status: Secondary | ICD-10-CM | POA: Diagnosis not present

## 2018-01-10 DIAGNOSIS — Z880 Allergy status to penicillin: Secondary | ICD-10-CM | POA: Diagnosis not present

## 2018-01-10 DIAGNOSIS — Z888 Allergy status to other drugs, medicaments and biological substances status: Secondary | ICD-10-CM | POA: Diagnosis not present

## 2018-01-10 DIAGNOSIS — Z462 Encounter for fitting and adjustment of other devices related to nervous system and special senses: Secondary | ICD-10-CM | POA: Diagnosis not present

## 2018-01-10 DIAGNOSIS — T85193A Other mechanical complication of implanted electronic neurostimulator, generator, initial encounter: Secondary | ICD-10-CM | POA: Diagnosis not present

## 2018-01-10 DIAGNOSIS — Z96651 Presence of right artificial knee joint: Secondary | ICD-10-CM | POA: Diagnosis not present

## 2018-01-10 DIAGNOSIS — E871 Hypo-osmolality and hyponatremia: Secondary | ICD-10-CM

## 2018-01-10 DIAGNOSIS — E785 Hyperlipidemia, unspecified: Secondary | ICD-10-CM | POA: Diagnosis not present

## 2018-01-10 DIAGNOSIS — Z01812 Encounter for preprocedural laboratory examination: Secondary | ICD-10-CM | POA: Diagnosis not present

## 2018-01-10 LAB — BASIC METABOLIC PANEL
ANION GAP: 10 (ref 5–15)
ANION GAP: 11 (ref 5–15)
BUN: 12 mg/dL (ref 8–23)
BUN: 15 mg/dL (ref 8–23)
CALCIUM: 9.3 mg/dL (ref 8.9–10.3)
CHLORIDE: 82 mmol/L — AB (ref 98–111)
CO2: 32 mmol/L (ref 22–32)
CO2: 30 mmol/L (ref 22–32)
CREATININE: 0.92 mg/dL (ref 0.44–1.00)
Calcium: 9.4 mg/dL (ref 8.9–10.3)
Chloride: 85 mmol/L — ABNORMAL LOW (ref 98–111)
Creatinine, Ser: 0.85 mg/dL (ref 0.44–1.00)
GFR calc non Af Amer: >60 mL/min (ref >60)
GFR calc non Af Amer: 60 mL/min — ABNORMAL LOW (ref >60)
Glucose, Bld: 111 mg/dL — ABNORMAL HIGH (ref 70–99)
Glucose, Bld: 174 mg/dL — ABNORMAL HIGH (ref 70–99)
POTASSIUM: 5 mmol/L (ref 3.5–5.1)
Potassium: 4.4 mmol/L (ref 3.5–5.1)
Sodium: 124 mmol/L — ABNORMAL LOW (ref 135–145)
Sodium: 126 mmol/L — ABNORMAL LOW (ref 135–145)

## 2018-01-10 LAB — CBC WITH DIFFERENTIAL/PLATELET
ABS IMMATURE GRANULOCYTES: 0.11 10*3/uL — AB (ref 0.00–0.07)
BASOS ABS: 0 10*3/uL (ref 0.0–0.1)
Basophils Relative: 0 %
Eosinophils Absolute: 0.2 10*3/uL (ref 0.0–0.5)
Eosinophils Relative: 3 %
HEMATOCRIT: 38.4 % (ref 36.0–46.0)
Hemoglobin: 12.4 g/dL (ref 12.0–15.0)
IMMATURE GRANULOCYTES: 1 %
LYMPHS ABS: 1.5 10*3/uL (ref 0.7–4.0)
LYMPHS PCT: 19 %
MCH: 27.3 pg (ref 26.0–34.0)
MCHC: 32.3 g/dL (ref 30.0–36.0)
MCV: 84.4 fL (ref 80.0–100.0)
Monocytes Absolute: 0.7 10*3/uL (ref 0.1–1.0)
Monocytes Relative: 8 %
NEUTROS ABS: 5.7 10*3/uL (ref 1.7–7.7)
NEUTROS PCT: 69 %
NRBC: 0 % (ref 0.0–0.2)
Platelets: 430 10*3/uL — ABNORMAL HIGH (ref 150–400)
RBC: 4.55 MIL/uL (ref 3.87–5.11)
RDW: 13.8 % (ref 11.5–15.5)
WBC: 8.2 10*3/uL (ref 4.0–10.5)

## 2018-01-10 NOTE — ED Notes (Signed)
RN attempted to place patient in gown and hook patient up to monitor. Pt and family upset that she has been waiting. RN thanked them for their patience. Pt notified a provider will come to assess the patient and notify her of the lab results. Pt refuses to change into gown. Will continue to monitor.

## 2018-01-10 NOTE — Telephone Encounter (Signed)
Called and spoke with patients pharmacy [facility], they stated that her insurance will cover [place] and Bevespi but will not cover [place].   RB please advise, thank you.   Called and spoke with patients husband, he is aware that RB is out of the office until 10/28 and he will discuss this at that time.

## 2018-01-10 NOTE — Discharge Instructions (Signed)
Stop taking the lisinopril as this can lower your sodium.  Your sodium level today is 126, which is the same as it was on 10/16.  Call Dr. Volanda Napoleon for an appointment tomorrow to help further adjust medicines and treat your sodium.  If she develops any new or concerning symptoms such as confusion out of the normal for her, vomiting, seizure-like activity, severe headache or weakness, or any other new/concerning symptoms and return to the ER for evaluation.

## 2018-01-10 NOTE — ED Provider Notes (Signed)
[facility]GENC[facility] DEPARTMENT Provider Note   CSN: [id number] Arrival date & time: 01/10/18  1345   LEVEL 5 CAVEAT - DEMENTIA   History   Chief Complaint Chief Complaint  Patient presents with  . Abnormal Lab    HPI Destiny Hughes is a 72 y.o. female.  HPI  72 year old female with a history of dementia presents for hyponatremia.  She was told to come to the ER to have her sodium level rechecked and to be evaluated for hyponatremia.  She is currently on sodium chloride tablets because of chronic hyponatremia.  The patient went to a preop visit with anesthesia today and had labs drawn that showed a sodium level of 124.  Patient has a baseline history of dementia but is not acting abnormal for her.  She has chronic weakness/fatigue for months but no new symptoms.  No focal weakness.  No vomiting or diarrhea or seizure-like activity.  History is mostly taken from the husband and son.  Patient is due to have a spinal cord stim later removed in 2 days.  Past Medical History:  Diagnosis Date  . Arthritis    OA- knees & back  neck  . Chronic back pain   . Cognitive retention disorder   . Collapse of right lung   . COPD (chronic obstructive pulmonary disease) (HCC)    Hx. Bronchitis occ, 01-25-11 somes issue now taking  Z-pak-started 01-24-11/Scar tissue  present  from previous lung collapse  . Dementia with psychosis ([place])   . Depression   . Depression   . Dyspnea   . Dysrhythmia 2018   treated with Ablation   . GERD (gastroesophageal reflux disease)    tx. TUMS  . Glaucoma   . Headache   . History of kidney stones    surgically treated  . Hyperlipemia   . Hypertension   . Hypothyroidism    tx. Levothyroxine  . Major depression with psychotic features ([place])   . Nephrolithiasis   . Neuromuscular disorder ([place]) 01-25-11   Pain/nerve stimulator implanted -2 yrs ago.  Marland Kitchen Reflex, gag, absent   . Reflex, gag, absent     Patient Active Problem List   Diagnosis Date Noted  . Hallucinations   . Psychosis ([place]) 11/07/2016  . Acute respiratory failure with hypoxia and hypercapnia ([place]) 11/03/2016  . Other chest pain 10/23/2016  . Tobacco abuse 10/23/2016  . Major depression with psychotic features ([place]) 10/19/2016  . Dementia with behavioral disturbance ([place]) 10/19/2016  . Depression due to physical illness   . Cognitive retention disorder   . Atrial flutter ([place]) 08/15/2016  . Paroxysmal atrial fibrillation ([place]) 08/15/2016  . SVT (supraventricular tachycardia) ([place]) 07/11/2016  . Weight loss 07/08/2016  . AKI (acute kidney injury) ([place]) 07/02/2016  . Acute kidney injury ([place]) 07/01/2016  . Hyperglycemia 07/01/2016  . Hyponatremia 07/01/2016  . Intractable nausea and vomiting 07/01/2016  . Abnormal EKG 07/01/2016  . Diastolic CHF ([place]) 85/04/7739  . Medication management 04/28/2015  . Emphysema lung ([place]) 12/26/2014  . Gastroesophageal reflux disease without esophagitis 12/26/2014  . Chronic bronchitis ([place]) 11/13/2014  . COPD (chronic obstructive pulmonary disease) ([place]) 10/03/2014  . At high risk for falls 10/03/2014  . Other specified hypothyroidism 10/03/2014  . Mitral valve prolapse 10/03/2014  . Encounter for Medicare annual wellness exam 10/03/2014  . IBS (irritable bowel syndrome) 04/01/2014  . S/P total knee replacement 04/22/2013  . Knee pain, chronic 04/22/2013  . Essential hypertension 04/15/2013  . Vitamin D  deficiency 04/15/2013  . Encounter for long-term (current) use of medications 04/15/2013  . Prediabetes 04/15/2013  . Osteoarthritis 01/31/2011  . Hyperlipemia   . Glaucoma   . Chronic pain disorder   . Chronic back pain     Past Surgical History:  Procedure Laterality Date  . A-FLUTTER ABLATION N/A 08/16/2016   Procedure: A-Flutter Ablation;  Surgeon: Evans Lance, MD;  Location: [facility];  Service: Cardiovascular;  Laterality: N/A;  . ANTERIOR CERVICAL DECOMP/DISCECTOMY FUSION     3  levels, per family  . APPENDECTOMY  01-25-11  . CARPAL TUNNEL RELEASE  01-25-11   Bil.  . CERVICAL SPINE SURGERY  01-25-11   2005-multiple levels  . CHOLECYSTECTOMY  01-25-11   '07  . COLONOSCOPY WITH PROPOFOL N/A 07/23/2012   Procedure: COLONOSCOPY WITH PROPOFOL;  Surgeon: Garlan Fair, MD;  Location: [facility];  Service: Endoscopy;  Laterality: N/A;  . detatched retna Bilateral    done July & August- 2019  . EYE SURGERY Bilateral 2019   repair of retina detachment- Dr. Zadie Rhine at surgical center   . JOINT REPLACEMENT  01-25-11   6'03 LTHA-hemi  . KNEE ARTHROPLASTY  01-25-11   '04-right, revised x2  . no gag reflex     Pt does not havea gag reflex following  siurgery to neck. Family unsure of date. Pt takes pill in apple sauce.  . SPINAL CORD STIMULATOR IMPLANT  2009 APPROX   DR. ELSNER  . THORACIC SPINE SURGERY  01-25-11   6'02- then nerve stimulator implanted after  . TOTAL KNEE REVISION  02/01/2011   Procedure: TOTAL KNEE REVISION;  Surgeon: Mauri Pole;  Location: [facility];  Service: Orthopedics;  Laterality: Right;  . TUBAL LIGATION       OB History   None      Home Medications    Prior to Admission medications   Medication Sig Start Date End Date Taking? Authorizing Provider  amLODipine (NORVASC) 5 MG tablet TAKE ONE TABLET DAILY Patient taking differently: Take 5 mg by mouth daily.  12/07/17  Yes Billie Ruddy, MD  aspirin EC 81 MG tablet Take 81 mg by mouth at bedtime.   Yes [provider]  divalproex (DEPAKOTE ER) 500 MG 24 hr tablet 2  qam Patient taking differently: Take 1,000 mg by mouth every morning.  10/17/17  Yes Plovsky, Berneta Sages, MD  donepezil (ARICEPT) 10 MG tablet 1  qhs Patient taking differently: Take 10 mg by mouth at bedtime.  12/21/17  Yes Plovsky, Berneta Sages, MD  fexofenadine (ALLEGRA) 180 MG tablet Take 180 mg by mouth daily.   Yes [provider]  furosemide (LASIX) 20 MG tablet TAKE ONE TABLET DAILY Patient taking differently:  Take 20 mg by mouth daily.  12/28/17  Yes Billie Ruddy, MD  gabapentin (NEURONTIN) 100 MG capsule TAKE ONE CAPSULE TWICE A DAY Patient taking differently: Take 100 mg by mouth 2 (two) times daily.  07/03/17  Yes Billie Ruddy, MD  levothyroxine (SYNTHROID, LEVOTHROID) 125 MCG tablet TAKE ONE TABLET DAILY BEFORE BREAKFAST Patient taking differently: Take 125 mcg by mouth at bedtime.  01/09/18  Yes Billie Ruddy, MD  lisinopril (PRINIVIL,ZESTRIL) 10 MG tablet Take 20 mg by mouth daily.    Yes [provider]  memantine (NAMENDA) 5 MG tablet TAKE ONE TABLET DAILY Patient taking differently: Take 5 mg by mouth daily.  05/22/17  Yes Billie Ruddy, MD  pantoprazole (PROTONIX) 40 MG tablet Take 40 mg  by mouth daily.   Yes [provider]  propranolol (INDERAL) 80 MG tablet TAKE ONE-HALF TABLET 3 TIMES DAILY Patient taking differently: Take 40 mg by mouth 3 (three) times daily.  05/22/17  Yes Billie Ruddy, MD  sodium chloride 1 g tablet TAKE ONE TABLET THREE TIMES DAILY Patient taking differently: Take 1 g by mouth 3 (three) times daily.  10/16/17  Yes Billie Ruddy, MD  temazepam (RESTORIL) 30 MG capsule Take 1 capsule (30 mg total) by mouth at bedtime as needed for sleep. 01/03/18  Yes Plovsky, Berneta Sages, MD  Tiotropium Bromide-Olodaterol (STIOLTO RESPIMAT) 2.5-2.5 MCG/ACT AERS Inhale 2 puffs into the lungs daily. 01/08/18  Yes Collene Gobble, MD  levalbuterol Penne Lash) 0.63 MG/3ML nebulizer solution Take 3 mLs (0.63 mg total) by nebulization every 4 (four) hours as needed for wheezing or shortness of breath. Patient not taking: Reported on 01/10/2018 12/05/17   Collene Gobble, MD  magnesium oxide (MAG-OX) 400 MG tablet Take 1 tablet (400 mg total) by mouth daily. Patient not taking: Reported on 12/28/2017 09/07/16   Erlene Quan, PA-C    Family History Family History  Problem Relation Age of Onset  . Heart attack Father   . Emphysema Father   . Aneurysm Mother         CEREBRAL  . Emphysema Mother   . Dementia Mother   . Diabetes Son   . Hypertension Son   . Hypertension Son   . Cancer Sister        Widely Metastatic  . Asthma Son        x2  . Breast cancer Paternal Aunt   . Rheum arthritis Maternal Aunt     Social History Social History   Tobacco Use  . Smoking status: Former Smoker    Packs/day: 0.75    Years: 50.00    Pack years: 37.50    Types: Cigarettes    Start date: 09/13/1961    Last attempt to quit: 05/2016    Years since quitting: 1.6  . Smokeless tobacco: Never Used  . Tobacco comment: Decreased Down to 2/3 a Day if Thinking [place] It  Substance Use Topics  . Alcohol use: No    Alcohol/week: 0.0 standard drinks    Comment: none in 10 years  . Drug use: No     Allergies   Albuterol; Penicillins; Sulfa antibiotics; Zanaflex [tizanidine]; Codeine; and Ecotrin [aspirin]   Review of Systems Review of Systems  Unable to perform ROS: Dementia     Physical Exam Updated Vital Signs BP (!) 154/64   Pulse 60   Temp 97.6 F (36.4 C) (Oral)   Resp 18   Ht 5\' 5"  (1.651 m)   Wt 85.7 kg   SpO2 100%   BMI 31.45 kg/m   Physical Exam  Constitutional: She appears well-developed and well-nourished. No distress.  HENT:  Head: Normocephalic and atraumatic.  Right Ear: External ear normal.  Left Ear: External ear normal.  Nose: Nose normal.  Eyes: Pupils are equal, round, and reactive to light. EOM are normal. Right eye exhibits no discharge. Left eye exhibits no discharge.  Cardiovascular: Normal rate, regular rhythm and normal heart sounds.  Pulmonary/Chest: Effort normal and breath sounds normal.  Abdominal: Soft. She exhibits no distension. There is no tenderness.  Neurological: She is alert. She is disoriented.  CN 3-12 grossly intact. 5/5 strength in all 4 extremities. Grossly normal sensation. Normal finger to nose.   Skin: Skin is warm and  dry. She is not diaphoretic.  Nursing note and vitals  reviewed.    ED Treatments / Results  Labs (all labs ordered are listed, but only abnormal results are displayed) Labs Reviewed  CBC WITH DIFFERENTIAL/PLATELET - Abnormal; Notable for the following components:      Result Value   Platelets 430 (*)    Abs Immature Granulocytes 0.11 (*)    All other components within normal limits  BASIC METABOLIC PANEL - Abnormal; Notable for the following components:   Sodium 126 (*)    Chloride 85 (*)    Glucose, Bld 174 (*)    GFR calc non Af Amer 60 (*)    All other components within normal limits    EKG None  Radiology No results found.  Procedures Procedures (including critical care time)  Medications Ordered in ED Medications - No data to display   Initial Impression / Assessment and Plan / ED Course  I have reviewed the triage vital signs and the nursing notes.  Pertinent labs & imaging results that were available during my care of the patient were reviewed by me and considered in my medical decision making (see chart for details).     Patient is acting at her baseline.  No focal neuro deficits.  She is a little confused on the date but otherwise seems at her baseline.  Sodium is 126 on check this afternoon.  Other labs are stable for her.  On chart review, on 10/16, she had a similar sodium level.  Thus, this obviously is at least subacute to chronic.  I do not think emergent admission or treatment is warranted then.  I have discussed that lisinopril could lower her sodium and she should stop this and follow-up with her doctor tomorrow to help adjust medicines.  Levothyroxine could also induce hyponatremia, but I do not think she should stop this right away.  Otherwise, she appears stable for an outpatient work-up and treatment of this.  We discussed return precautions.  Final Clinical Impressions(s) / ED Diagnoses   Final diagnoses:  Hyponatremia    ED Discharge Orders    None       Sherwood Gambler, MD 01/10/18 1818

## 2018-01-10 NOTE — Telephone Encounter (Signed)
Called and spoke with patients husband, he stated that he wanted a different medication called in for the patient since their insurance will not cover it. Advised patients husband that he will need to call us back with information on what inhalers their insurance will cover. He will call us back with this information. In the meantime I have placed a sample up front for the patient. Nothing further needed.

## 2018-01-10 NOTE — ED Triage Notes (Signed)
Pt was seen at [facility] today for pre surgery blood work, pt sent here for evaluation for low sodium.

## 2018-01-10 NOTE — ED Notes (Signed)
ED Provider at bedside. 

## 2018-01-10 NOTE — ED Notes (Signed)
Patient verbalizes understanding of discharge instructions. Opportunity for questioning and answers were provided. Armband removed by staff, pt discharged from ED. Pt refused d/c vital signs.

## 2018-01-10 NOTE — ED Provider Notes (Signed)
Patient placed in Quick Look pathway, seen and evaluated   Chief Complaint: abnormal labs  HPI: Destiny Hughes is a 72 y.o. female who presents to the ED from her doctor's office for low sodium. Patient had pre surgery blood work and sent here. Patient with hx of low sodium  ROS: Patient denies any symptoms  Physical Exam:  BP 128/64 (BP Location: Right Arm)   Pulse 95   Temp 97.6 F (36.4 C) (Oral)   Resp 20   Ht 5\' 5"  (1.651 m)   Wt 85.7 kg   SpO2 95%   BMI 31.45 kg/m    Gen: No distress  Neuro: Awake and Alert  Skin: Warm and dry   Initiation of care has begun. The patient has been counseled on the process, plan, and necessity for staying for the completion/evaluation, and the remainder of the medical screening examination    Ashley Murrain, NP 01/10/18 1405    Dorie Rank, MD 01/13/18 [phone]

## 2018-01-11 ENCOUNTER — Telehealth: Payer: Self-pay | Admitting: Family Medicine

## 2018-01-11 NOTE — Telephone Encounter (Signed)
Copied from [place] [id number]. Topic: Quick Communication - See Telephone Encounter >> Jan 11, 2018  8:57 AM Margot Ables wrote: CRM for notification. See Telephone encounter for: 01/11/18. Pt went to ER after preop labs revealed low sodium. Not much done at the ER but recheck of labs and advised pt husband to have her stop taking lisinopril. Pt is supposed to have surgery 10/25 to replace a back stimulator. He is wondering if it will be cancelled due to the lab work. No appt available today with Dr. Volanda Napoleon today. Mr. Lagoy is asking for advice on how to proceed.

## 2018-01-11 NOTE — Telephone Encounter (Signed)
The lasix can cause low potassium.

## 2018-01-11 NOTE — Telephone Encounter (Signed)
[name] son calling in:  States that father gets confused sometimes.  Pt is having surgery tomorrow - that is good to go.  They are wondering if low sodium can be caused by Lasix.

## 2018-01-11 NOTE — Anesthesia Preprocedure Evaluation (Addendum)
Anesthesia Evaluation  Patient identified by MRN, date of birth, ID band Patient awake    Reviewed: Allergy & Precautions, NPO status , Patient's Chart, lab work & pertinent test results  Airway Mallampati: II  TM Distance: >3 FB Neck ROM: Full    Dental no notable dental hx.    Pulmonary COPD, former smoker,    Pulmonary exam normal breath sounds clear to auscultation       Cardiovascular hypertension, Pt. on medications Normal cardiovascular exam+ dysrhythmias Atrial Fibrillation  Rhythm:Regular Rate:Normal     Neuro/Psych PSYCHIATRIC DISORDERS Depression Dementia negative neurological ROS     GI/Hepatic negative GI ROS, Neg liver ROS,   Endo/Other  Hypothyroidism   Renal/GU Renal diseaseProblems with hyponatremia. BMET to be checked today  Will proceed if not worsening  negative genitourinary   Musculoskeletal negative musculoskeletal ROS (+)   Abdominal   Peds negative pediatric ROS (+)  Hematology negative hematology ROS (+)   Anesthesia Other Findings   Reproductive/Obstetrics negative OB ROS                           Anesthesia Physical Anesthesia Plan  ASA: III  Anesthesia Plan: General   Post-op Pain Management:    Induction: Intravenous  PONV Risk Score and Plan: Ondansetron, Dexamethasone and Treatment may vary due to age or medical condition  Airway Management Planned: Oral ETT  Additional Equipment:   Intra-op Plan:   Post-operative Plan: Extubation in OR  Informed Consent: I have reviewed the patients History and Physical, chart, labs and discussed the procedure including the risks, benefits and alternatives for the proposed anesthesia with the patient or authorized representative who has indicated his/her understanding and acceptance.   Dental advisory given  Plan Discussed with: CRNA and Surgeon  Anesthesia Plan Comments: (See PAT note written 01/11/2018  by Myra Gianotti, PA-C. )       Anesthesia Quick Evaluation

## 2018-01-11 NOTE — Telephone Encounter (Signed)
Pt husband states that the ED has stopped the lisinopril, pt is concerned if this is ok and wants your advise if she should do anything else before surgery tomorrow.

## 2018-01-11 NOTE — Telephone Encounter (Signed)
Spoke with pt husband voiced understanding that low sodium is caused by low potassium and not lasix.

## 2018-01-12 ENCOUNTER — Ambulatory Visit (HOSPITAL_COMMUNITY): Payer: Medicare Other | Admitting: Vascular Surgery

## 2018-01-12 ENCOUNTER — Ambulatory Visit (HOSPITAL_COMMUNITY)
Admission: RE | Admit: 2018-01-12 | Discharge: 2018-01-12 | Disposition: A | Discharge status: Home / Self Care | Payer: Medicare Other | Source: Ambulatory Visit | Attending: Anesthesiology | Admitting: Anesthesiology

## 2018-01-12 ENCOUNTER — Other Ambulatory Visit: Payer: Self-pay

## 2018-01-12 ENCOUNTER — Encounter (HOSPITAL_COMMUNITY)
Admission: RE | Disposition: A | Discharge status: Home / Self Care | Payer: Self-pay | Source: Ambulatory Visit | Attending: Anesthesiology

## 2018-01-12 ENCOUNTER — Encounter (HOSPITAL_COMMUNITY): Payer: Self-pay | Admitting: *Deleted

## 2018-01-12 DIAGNOSIS — M545 Low back pain: Secondary | ICD-10-CM | POA: Diagnosis not present

## 2018-01-12 DIAGNOSIS — Z79890 Hormone replacement therapy (postmenopausal): Secondary | ICD-10-CM | POA: Insufficient documentation

## 2018-01-12 DIAGNOSIS — G894 Chronic pain syndrome: Secondary | ICD-10-CM | POA: Diagnosis not present

## 2018-01-12 DIAGNOSIS — M5416 Radiculopathy, lumbar region: Secondary | ICD-10-CM | POA: Diagnosis not present

## 2018-01-12 DIAGNOSIS — M961 Postlaminectomy syndrome, not elsewhere classified: Secondary | ICD-10-CM | POA: Insufficient documentation

## 2018-01-12 DIAGNOSIS — F329 Major depressive disorder, single episode, unspecified: Secondary | ICD-10-CM | POA: Insufficient documentation

## 2018-01-12 DIAGNOSIS — Z882 Allergy status to sulfonamides status: Secondary | ICD-10-CM | POA: Insufficient documentation

## 2018-01-12 DIAGNOSIS — T85193A Other mechanical complication of implanted electronic neurostimulator, generator, initial encounter: Secondary | ICD-10-CM | POA: Diagnosis not present

## 2018-01-12 DIAGNOSIS — J449 Chronic obstructive pulmonary disease, unspecified: Secondary | ICD-10-CM | POA: Insufficient documentation

## 2018-01-12 DIAGNOSIS — K219 Gastro-esophageal reflux disease without esophagitis: Secondary | ICD-10-CM | POA: Diagnosis not present

## 2018-01-12 DIAGNOSIS — M1990 Unspecified osteoarthritis, unspecified site: Secondary | ICD-10-CM | POA: Insufficient documentation

## 2018-01-12 DIAGNOSIS — I10 Essential (primary) hypertension: Secondary | ICD-10-CM | POA: Insufficient documentation

## 2018-01-12 DIAGNOSIS — Z4542 Encounter for adjustment and management of neuropacemaker (brain) (peripheral nerve) (spinal cord): Secondary | ICD-10-CM | POA: Diagnosis not present

## 2018-01-12 DIAGNOSIS — Z886 Allergy status to analgesic agent status: Secondary | ICD-10-CM | POA: Insufficient documentation

## 2018-01-12 DIAGNOSIS — Z9682 Presence of neurostimulator: Secondary | ICD-10-CM | POA: Diagnosis not present

## 2018-01-12 DIAGNOSIS — Z888 Allergy status to other drugs, medicaments and biological substances status: Secondary | ICD-10-CM | POA: Insufficient documentation

## 2018-01-12 DIAGNOSIS — E039 Hypothyroidism, unspecified: Secondary | ICD-10-CM | POA: Insufficient documentation

## 2018-01-12 DIAGNOSIS — Z01812 Encounter for preprocedural laboratory examination: Secondary | ICD-10-CM | POA: Diagnosis not present

## 2018-01-12 DIAGNOSIS — Z87891 Personal history of nicotine dependence: Secondary | ICD-10-CM | POA: Insufficient documentation

## 2018-01-12 DIAGNOSIS — G8929 Other chronic pain: Secondary | ICD-10-CM | POA: Diagnosis not present

## 2018-01-12 DIAGNOSIS — Z462 Encounter for fitting and adjustment of other devices related to nervous system and special senses: Secondary | ICD-10-CM | POA: Insufficient documentation

## 2018-01-12 DIAGNOSIS — Z96651 Presence of right artificial knee joint: Secondary | ICD-10-CM | POA: Insufficient documentation

## 2018-01-12 DIAGNOSIS — Z880 Allergy status to penicillin: Secondary | ICD-10-CM | POA: Insufficient documentation

## 2018-01-12 DIAGNOSIS — E785 Hyperlipidemia, unspecified: Secondary | ICD-10-CM | POA: Insufficient documentation

## 2018-01-12 DIAGNOSIS — Z79899 Other long term (current) drug therapy: Secondary | ICD-10-CM | POA: Insufficient documentation

## 2018-01-12 DIAGNOSIS — Z7982 Long term (current) use of aspirin: Secondary | ICD-10-CM | POA: Insufficient documentation

## 2018-01-12 HISTORY — PX: SPINAL CORD STIMULATOR BATTERY EXCHANGE: SHX6202

## 2018-01-12 LAB — BASIC METABOLIC PANEL
ANION GAP: 7 (ref 5–15)
BUN: 14 mg/dL (ref 8–23)
CO2: 30 mmol/L (ref 22–32)
Calcium: 9 mg/dL (ref 8.9–10.3)
Chloride: 87 mmol/L — ABNORMAL LOW (ref 98–111)
Creatinine, Ser: 0.88 mg/dL (ref 0.44–1.00)
GFR calc Af Amer: >60 mL/min (ref >60)
GFR calc non Af Amer: >60 mL/min (ref >60)
GLUCOSE: 121 mg/dL — AB (ref 70–99)
POTASSIUM: 4.7 mmol/L (ref 3.5–5.1)
Sodium: 124 mmol/L — ABNORMAL LOW (ref 135–145)

## 2018-01-12 SURGERY — SPINAL CORD STIMULATOR BATTERY EXCHANGE
Anesthesia: General | Site: Hip

## 2018-01-12 MED ORDER — PROPOFOL 10 MG/ML IV BOLUS
INTRAVENOUS | Status: AC
  Filled 2018-01-12: qty 20

## 2018-01-12 MED ORDER — DEXMEDETOMIDINE HCL 200 MCG/2ML IV SOLN
INTRAVENOUS | Status: DC | PRN
  Administered 2018-01-12: 8 ug via INTRAVENOUS

## 2018-01-12 MED ORDER — LIDOCAINE-EPINEPHRINE 1 %-1:100000 IJ SOLN
INTRAMUSCULAR | Status: AC
  Filled 2018-01-12: qty 1

## 2018-01-12 MED ORDER — MIDAZOLAM HCL 2 MG/2ML IJ SOLN
1.0000 mg | Freq: Once | INTRAMUSCULAR | Status: AC
  Administered 2018-01-12: 1 mg via INTRAVENOUS

## 2018-01-12 MED ORDER — VANCOMYCIN HCL IN DEXTROSE 1-5 GM/200ML-% IV SOLN
1000.0000 mg | INTRAVENOUS | Status: AC
  Administered 2018-01-12: 1000 mg via INTRAVENOUS
  Filled 2018-01-12: qty 200

## 2018-01-12 MED ORDER — GLYCOPYRROLATE 0.2 MG/ML IJ SOLN
INTRAMUSCULAR | Status: DC | PRN
  Administered 2018-01-12: 0.2 mg via INTRAVENOUS

## 2018-01-12 MED ORDER — PROPOFOL 10 MG/ML IV BOLUS
INTRAVENOUS | Status: DC | PRN
  Administered 2018-01-12: 140 mg via INTRAVENOUS

## 2018-01-12 MED ORDER — LIDOCAINE 2% (20 MG/ML) 5 ML SYRINGE
INTRAMUSCULAR | Status: AC
  Filled 2018-01-12: qty 5

## 2018-01-12 MED ORDER — SUGAMMADEX SODIUM 200 MG/2ML IV SOLN
INTRAVENOUS | Status: DC | PRN
  Administered 2018-01-12: 250 mg via INTRAVENOUS

## 2018-01-12 MED ORDER — PROMETHAZINE HCL 25 MG/ML IJ SOLN: 6.2500 mg | INTRAMUSCULAR | Status: DC | PRN

## 2018-01-12 MED ORDER — ONDANSETRON HCL 4 MG/2ML IJ SOLN
INTRAMUSCULAR | Status: AC
  Filled 2018-01-12: qty 2

## 2018-01-12 MED ORDER — 0.9 % SODIUM CHLORIDE (POUR BTL) OPTIME
TOPICAL | Status: DC | PRN
  Administered 2018-01-12: 1000 mL

## 2018-01-12 MED ORDER — DEXAMETHASONE SODIUM PHOSPHATE 10 MG/ML IJ SOLN
INTRAMUSCULAR | Status: DC | PRN
  Administered 2018-01-12: 10 mg via INTRAVENOUS

## 2018-01-12 MED ORDER — LIDOCAINE 2% (20 MG/ML) 5 ML SYRINGE
INTRAMUSCULAR | Status: DC | PRN
  Administered 2018-01-12: 60 mg via INTRAVENOUS

## 2018-01-12 MED ORDER — EPHEDRINE SULFATE-NACL 50-0.9 MG/10ML-% IV SOSY
PREFILLED_SYRINGE | INTRAVENOUS | Status: DC | PRN
  Administered 2018-01-12: 10 mg via INTRAVENOUS

## 2018-01-12 MED ORDER — MIDAZOLAM HCL 2 MG/2ML IJ SOLN
INTRAMUSCULAR | Status: AC
  Administered 2018-01-12: 1 mg via INTRAVENOUS
  Filled 2018-01-12: qty 2

## 2018-01-12 MED ORDER — CLINDAMYCIN HCL 150 MG PO CAPS: 150.0000 mg | ORAL_CAPSULE | Freq: Three times a day (TID) | ORAL | 0 refills | Status: AC

## 2018-01-12 MED ORDER — FENTANYL CITRATE (PF) 250 MCG/5ML IJ SOLN
INTRAMUSCULAR | Status: AC
  Filled 2018-01-12: qty 5

## 2018-01-12 MED ORDER — FENTANYL CITRATE (PF) 100 MCG/2ML IJ SOLN
INTRAMUSCULAR | Status: DC | PRN
  Administered 2018-01-12 (×2): 50 ug via INTRAVENOUS

## 2018-01-12 MED ORDER — ROCURONIUM BROMIDE 50 MG/5ML IV SOSY
PREFILLED_SYRINGE | INTRAVENOUS | Status: AC
  Filled 2018-01-12: qty 5

## 2018-01-12 MED ORDER — LIDOCAINE-EPINEPHRINE 1 %-1:100000 IJ SOLN
INTRAMUSCULAR | Status: DC | PRN
  Administered 2018-01-12: 15 mL

## 2018-01-12 MED ORDER — PHENYLEPHRINE 40 MCG/ML (10ML) SYRINGE FOR IV PUSH (FOR BLOOD PRESSURE SUPPORT)
PREFILLED_SYRINGE | INTRAVENOUS | Status: DC | PRN
  Administered 2018-01-12: 80 ug via INTRAVENOUS

## 2018-01-12 MED ORDER — LACTATED RINGERS IV SOLN
INTRAVENOUS | Status: DC
  Administered 2018-01-12: 10:00:00 via INTRAVENOUS

## 2018-01-12 MED ORDER — HYDROCODONE-ACETAMINOPHEN 5-325 MG PO TABS: 1.0000 | ORAL_TABLET | ORAL | 0 refills | Status: AC | PRN

## 2018-01-12 MED ORDER — DEXAMETHASONE SODIUM PHOSPHATE 10 MG/ML IJ SOLN
INTRAMUSCULAR | Status: AC
  Filled 2018-01-12: qty 1

## 2018-01-12 MED ORDER — SODIUM CHLORIDE 0.9 % IV SOLN
INTRAVENOUS | Status: DC | PRN
  Administered 2018-01-12: 500 mL

## 2018-01-12 MED ORDER — SODIUM CHLORIDE 0.9 % IV SOLN
INTRAVENOUS | Status: DC | PRN
  Administered 2018-01-12: 13:00:00 via INTRAVENOUS

## 2018-01-12 MED ORDER — FENTANYL CITRATE (PF) 100 MCG/2ML IJ SOLN: 25.0000 ug | INTRAMUSCULAR | Status: DC | PRN

## 2018-01-12 MED ORDER — ROCURONIUM BROMIDE 100 MG/10ML IV SOLN
INTRAVENOUS | Status: DC | PRN
  Administered 2018-01-12: 50 mg via INTRAVENOUS

## 2018-01-12 MED ORDER — ONDANSETRON HCL 4 MG/2ML IJ SOLN
INTRAMUSCULAR | Status: DC | PRN
  Administered 2018-01-12: 4 mg via INTRAVENOUS

## 2018-01-12 MED ORDER — CHLORHEXIDINE GLUCONATE CLOTH 2 % EX PADS: 6.0000 | MEDICATED_PAD | Freq: Once | CUTANEOUS | Status: DC

## 2018-01-12 SURGICAL SUPPLY — 44 items
ADH SKN CLS APL DERMABOND .7 (GAUZE/BANDAGES/DRESSINGS) ×1
BINDER ABD UNIV 12 45-62 (WOUND CARE) ×1 IMPLANT
BINDER ABDOMINAL 46IN 62IN (WOUND CARE) ×2
BLADE CLIPPER SURG (BLADE) IMPLANT
CHLORAPREP W/TINT 26ML (MISCELLANEOUS) ×2 IMPLANT
COVER WAND RF STERILE (DRAPES) ×2 IMPLANT
DERMABOND ADVANCED (GAUZE/BANDAGES/DRESSINGS) ×1
DERMABOND ADVANCED .7 DNX12 (GAUZE/BANDAGES/DRESSINGS) ×1 IMPLANT
DRAPE LAPAROTOMY 100X72X124 (DRAPES) ×2 IMPLANT
DRSG OPSITE POSTOP 3X4 (GAUZE/BANDAGES/DRESSINGS) ×1 IMPLANT
GLOVE BIO SURGEON STRL SZ7.5 (GLOVE) ×1 IMPLANT
GLOVE BIOGEL PI IND STRL 6.5 (GLOVE) IMPLANT
GLOVE BIOGEL PI IND STRL 7.5 (GLOVE) ×2 IMPLANT
GLOVE BIOGEL PI INDICATOR 6.5 (GLOVE) ×1
GLOVE BIOGEL PI INDICATOR 7.5 (GLOVE) ×2
GLOVE ECLIPSE 7.5 STRL STRAW (GLOVE) ×2 IMPLANT
GLOVE EXAM NITRILE LRG STRL (GLOVE) IMPLANT
GLOVE EXAM NITRILE XL STR (GLOVE) IMPLANT
GLOVE EXAM NITRILE XS STR PU (GLOVE) IMPLANT
GLOVE SS BIOGEL STRL SZ 6.5 (GLOVE) IMPLANT
GLOVE SUPERSENSE BIOGEL SZ 6.5 (GLOVE) ×1
GOWN STRL REUS W/ TWL LRG LVL3 (GOWN DISPOSABLE) IMPLANT
GOWN STRL REUS W/TWL LRG LVL3 (GOWN DISPOSABLE) ×2
KIT BASIN OR (CUSTOM PROCEDURE TRAY) ×2 IMPLANT
KIT CHARGING (KITS) ×1
KIT CHARGING PRECISION NEURO (KITS) IMPLANT
KIT REMOTE CONTROL 112802 FREE (KITS) ×2 IMPLANT
KIT TURNOVER KIT B (KITS) ×2 IMPLANT
NDL HYPO 25X1 1.5 SAFETY (NEEDLE) ×1 IMPLANT
NEEDLE HYPO 25X1 1.5 SAFETY (NEEDLE) ×2 IMPLANT
PACK LAMINECTOMY NEURO (CUSTOM PROCEDURE TRAY) ×2 IMPLANT
PAD ARMBOARD 7.5X6 YLW CONV (MISCELLANEOUS) ×10 IMPLANT
PRECISION MONT MRIPULSE 303103 (Spinal Cord Stimulator) ×2 IMPLANT
SPONGE LAP 4X18 RFD (DISPOSABLE) IMPLANT
STIMULATOR SPINAL MRI PULSE (Spinal Cord Stimulator) IMPLANT
SUT MNCRL AB 3-0 PS2 18 (SUTURE) ×1 IMPLANT
SUT MNCRL AB 4-0 PS2 18 (SUTURE) ×1 IMPLANT
SUT SILK 2 0 PERMA HAND 18 BK (SUTURE) IMPLANT
SUT VIC AB 2-0 CP2 18 (SUTURE) ×2 IMPLANT
SYR 10ML LL (SYRINGE) IMPLANT
SYR EPIDURAL 5ML GLASS (SYRINGE) IMPLANT
TOWEL GREEN STERILE (TOWEL DISPOSABLE) ×2 IMPLANT
TOWEL GREEN STERILE FF (TOWEL DISPOSABLE) ×2 IMPLANT
WATER STERILE IRR 1000ML POUR (IV SOLUTION) ×2 IMPLANT

## 2018-01-12 NOTE — Discharge Instructions (Addendum)

## 2018-01-12 NOTE — H&P (Signed)
Destiny Hughes is an 72 y.o. female.   Chief Complaint: SCS IPG failure HPI: patient in Dr. Hardin Negus care, referred for IPG replacement.   Patient underwent SCS placement in 2009 with Dr. Ellene Route, but sometime about 2 years ago the battery failed. The patient is a poor historian but her recollection--and that of her family-is that the device was still helping her ~50% or more at the time the battery failed.  More recently the patient has been having cognitive issues, and her psychiatrist has indicated to the family that a brain MRI would be helpful. Patient is referred for IPG replacement, with MRI compatible IPG>  Past Medical History:  Diagnosis Date  . Arthritis    OA- knees & back  neck  . Chronic back pain   . Cognitive retention disorder   . Collapse of right lung   . COPD (chronic obstructive pulmonary disease) (HCC)    Hx. Bronchitis occ, 01-25-11 somes issue now taking  Z-pak-started 01-24-11/Scar tissue  present  from previous lung collapse  . Dementia with psychosis ([place])   . Depression   . Depression   . Dyspnea   . Dysrhythmia 2018   treated with Ablation   . GERD (gastroesophageal reflux disease)    tx. TUMS  . Glaucoma   . Headache   . History of kidney stones    surgically treated  . Hyperlipemia   . Hypertension   . Hypothyroidism    tx. Levothyroxine  . Major depression with psychotic features ([place])   . Nephrolithiasis   . Neuromuscular disorder ([place]) 01-25-11   Pain/nerve stimulator implanted -2 yrs ago.  Marland Kitchen Reflex, gag, absent   . Reflex, gag, absent     Past Surgical History:  Procedure Laterality Date  . A-FLUTTER ABLATION N/A 08/16/2016   Procedure: A-Flutter Ablation;  Surgeon: Evans Lance, MD;  Location: [facility];  Service: Cardiovascular;  Laterality: N/A;  . ANTERIOR CERVICAL DECOMP/DISCECTOMY FUSION     3 levels, per family  . APPENDECTOMY  01-25-11  . CARPAL TUNNEL RELEASE  01-25-11   Bil.  . CERVICAL SPINE SURGERY  01-25-11    2005-multiple levels  . CHOLECYSTECTOMY  01-25-11   '07  . COLONOSCOPY WITH PROPOFOL N/A 07/23/2012   Procedure: COLONOSCOPY WITH PROPOFOL;  Surgeon: Garlan Fair, MD;  Location: [facility];  Service: Endoscopy;  Laterality: N/A;  . detatched retna Bilateral    done July & August- 2019  . EYE SURGERY Bilateral 2019   repair of retina detachment- Dr. Zadie Rhine at surgical center   . JOINT REPLACEMENT  01-25-11   6'03 LTHA-hemi  . KNEE ARTHROPLASTY  01-25-11   '04-right, revised x2  . no gag reflex     Pt does not havea gag reflex following  siurgery to neck. Family unsure of date. Pt takes pill in apple sauce.  . SPINAL CORD STIMULATOR IMPLANT  2009 APPROX   DR. ELSNER  . THORACIC SPINE SURGERY  01-25-11   6'02- then nerve stimulator implanted after  . TOTAL KNEE REVISION  02/01/2011   Procedure: TOTAL KNEE REVISION;  Surgeon: Mauri Pole;  Location: [facility];  Service: Orthopedics;  Laterality: Right;  . TUBAL LIGATION      Family History  Problem Relation Age of Onset  . Heart attack Father   . Emphysema Father   . Aneurysm Mother        CEREBRAL  . Emphysema Mother   . Dementia Mother   . Diabetes Son   .  Hypertension Son   . Hypertension Son   . Cancer Sister        Widely Metastatic  . Asthma Son        x2  . Breast cancer Paternal Aunt   . Rheum arthritis Maternal Aunt    Social History:  reports that she quit smoking about 19 months ago. Her smoking use included cigarettes. She started smoking about 56 years ago. She has a 37.50 pack-year smoking history. She has never used smokeless tobacco. She reports that she does not drink alcohol or use drugs.  Allergies:  Allergies  Allergen Reactions  . Albuterol Shortness Of Breath and Other (See Comments)  . Penicillins Anaphylaxis, Swelling and Other (See Comments)    Has patient had a PCN reaction causing immediate rash, facial/tongue/throat swelling, SOB or lightheadedness with hypotension: Yes Has patient had a PCN  reaction causing severe rash involving mucus membranes or skin necrosis: Rash Has patient had a PCN reaction that required hospitalization: No Has patient had a PCN reaction occurring within the last 10 years: No If all of the above answers are "NO", then may proceed with Cephalosporin use.  . Sulfa Antibiotics Swelling and Other (See Comments)    Throat swells, but no shortness of breath noted  . Codeine Nausea And Vomiting  . Ecotrin [Aspirin] Nausea And Vomiting  . Zanaflex [Tizanidine] Other (See Comments)    Possible confusion    Medications Prior to Admission  Medication Sig Dispense Refill  . amLODipine (NORVASC) 5 MG tablet TAKE ONE TABLET DAILY (Patient taking differently: Take 5 mg by mouth daily. ) 90 tablet 1  . aspirin EC 81 MG tablet Take 81 mg by mouth at bedtime.    . divalproex (DEPAKOTE ER) 500 MG 24 hr tablet 2  qam (Patient taking differently: Take 1,000 mg by mouth every morning. ) 60 tablet 3  . donepezil (ARICEPT) 10 MG tablet 1  qhs (Patient taking differently: Take 10 mg by mouth at bedtime. ) 30 tablet 0  . fexofenadine (ALLEGRA) 180 MG tablet Take 180 mg by mouth daily.    . furosemide (LASIX) 20 MG tablet TAKE ONE TABLET DAILY (Patient taking differently: Take 20 mg by mouth daily. ) 30 tablet 0  . gabapentin (NEURONTIN) 100 MG capsule TAKE ONE CAPSULE TWICE A DAY (Patient taking differently: Take 100 mg by mouth 2 (two) times daily. ) 180 capsule 2  . levalbuterol (XOPENEX) 0.63 MG/3ML nebulizer solution Take 3 mLs (0.63 mg total) by nebulization every 4 (four) hours as needed for wheezing or shortness of breath. 360 mL 5  . levothyroxine (SYNTHROID, LEVOTHROID) 125 MCG tablet TAKE ONE TABLET DAILY BEFORE BREAKFAST (Patient taking differently: Take 125 mcg by mouth at bedtime. ) 90 tablet 0  . memantine (NAMENDA) 5 MG tablet TAKE ONE TABLET DAILY (Patient taking differently: Take 5 mg by mouth daily. ) 90 tablet 3  . pantoprazole (PROTONIX) 40 MG tablet Take 40 mg  by mouth daily.    . propranolol (INDERAL) 80 MG tablet TAKE ONE-HALF TABLET 3 TIMES DAILY (Patient taking differently: Take 40 mg by mouth 3 (three) times daily. ) 135 tablet 3  . sodium chloride 1 g tablet TAKE ONE TABLET THREE TIMES DAILY (Patient taking differently: Take 1 g by mouth 3 (three) times daily. ) 180 tablet 0  . temazepam (RESTORIL) 30 MG capsule Take 1 capsule (30 mg total) by mouth at bedtime as needed for sleep. 30 capsule 4  . Tiotropium Bromide-Olodaterol (STIOLTO RESPIMAT)  2.5-2.5 MCG/ACT AERS Inhale 2 puffs into the lungs daily. 1 Inhaler 2  . magnesium oxide (MAG-OX) 400 MG tablet Take 1 tablet (400 mg total) by mouth daily. (Patient not taking: Reported on 12/28/2017) 30 tablet 3    Results for orders placed or performed during the hospital encounter of 01/12/18 (from the past 48 hour(s))  Basic metabolic panel     Status: Abnormal   Collection Time: 01/12/18  9:15 AM  Result Value Ref Range   Sodium 124 (L) 135 - 145 mmol/L   Potassium 4.7 3.5 - 5.1 mmol/L   Chloride 87 (L) 98 - 111 mmol/L   CO2 30 22 - 32 mmol/L   Glucose, Bld 121 (H) 70 - 99 mg/dL   BUN 14 8 - 23 mg/dL   Creatinine, Ser 0.88 0.44 - 1.00 mg/dL   Calcium 9.0 8.9 - 10.3 mg/dL   GFR calc non Af Amer >60 >60 mL/min   GFR calc Af Amer >60 >60 mL/min    Comment: (NOTE) The eGFR has been calculated using the CKD EPI equation. This calculation has not been validated in all clinical situations. eGFR's persistently <60 mL/min signify possible Chronic Kidney Disease.    Anion gap 7 5 - 15    Comment: Performed at [facility] [address], [place], [place] [address]   No results found.  Review of Systems  Constitutional: Negative.   HENT: Negative.   Eyes: Negative.   Respiratory: Negative.   Cardiovascular: Negative.   Gastrointestinal: Negative.   Genitourinary: Negative.   Musculoskeletal: Positive for back pain and neck pain. Negative for falls and myalgias.  Skin: Negative.    Neurological: Negative.   Endo/Heme/Allergies: Negative.   Psychiatric/Behavioral: Negative.     Blood pressure (!) 172/59, pulse (!) 52, temperature 97.8 F (36.6 C), temperature source Oral, resp. rate 18, height '5\' 5"'  (1.651 m), weight 86.2 kg, SpO2 96 %. Physical Exam  Constitutional: She appears well-developed and well-nourished.  HENT:  Head: Normocephalic and atraumatic.  Eyes: Pupils are equal, round, and reactive to light. EOM are normal.  Neck: Normal range of motion.  Cardiovascular: Normal rate.  Respiratory: Effort normal.  Musculoskeletal: Normal range of motion.  Neurological: She is alert.  Skin: Skin is warm and dry.  Psychiatric: She has a normal mood and affect. Her behavior is normal. Thought content normal.     Assessment/Plan SCS, battery failure PLAN: replace IPG, [employer] MRI compatible.   Bonna Gains, MD 01/12/2018, 10:18 AM

## 2018-01-12 NOTE — Transfer of Care (Signed)
Immediate Anesthesia Transfer of Care Note  Patient: Destiny Hughes  Procedure(s) Performed: Spinal cord stimulator battery replacement (N/A Hip)  Patient Location: PACU  Anesthesia Type:General  Level of Consciousness: awake and alert   Airway & Oxygen Therapy: Patient Spontanous Breathing and Patient connected to face mask oxygen  Post-op Assessment: Report given to RN and Post -op Vital signs reviewed and stable  Post vital signs: Reviewed and stable  Last Vitals:  Vitals Value Taken Time  BP 128/62 01/12/2018  1:58 PM  Temp 36.6 C 01/12/2018  1:58 PM  Pulse 83 01/12/2018  2:06 PM  Resp 16 01/12/2018  2:06 PM  SpO2 89 % 01/12/2018  2:06 PM  Vitals shown include unvalidated device data.  Last Pain:  Vitals:   01/12/18 1358  TempSrc:   PainSc: 0-No pain         Complications: No apparent anesthesia complications

## 2018-01-12 NOTE — Anesthesia Postprocedure Evaluation (Signed)
Anesthesia Post Note  Patient: Destiny Hughes  Procedure(s) Performed: Spinal cord stimulator battery replacement (N/A Hip)     Patient location during evaluation: PACU Anesthesia Type: General Level of consciousness: awake and alert Pain management: pain level controlled Vital Signs Assessment: post-procedure vital signs reviewed and stable Respiratory status: spontaneous breathing, nonlabored ventilation, respiratory function stable and patient connected to nasal cannula oxygen Cardiovascular status: blood pressure returned to baseline and stable Postop Assessment: no apparent nausea or vomiting Anesthetic complications: no    Last Vitals:  Vitals:   01/12/18 1413 01/12/18 1420  BP: 127/68 127/68  Pulse: 80 76  Resp: 11 13  Temp:  36.6 C  SpO2: 96% 94%    Last Pain:  Vitals:   01/12/18 1420  TempSrc:   PainSc: 0-No pain                 Emmie Frakes S

## 2018-01-12 NOTE — Op Note (Signed)
PREOP DX: 1) lumbago  2) lumbar radiculopathy  3) lumbar post-laminectomy syndrome  4) chronic pain  POSTOP DX: 1) lumbago  2) lumbar radiculopathy  3) lumbar post-laminectomy syndrome  4) chronic pain  PROCEDURES PERFORMED:removal of [employer] SCS generator, inplant [employer] Montage MRI compatible SCS generator SURGEON:Melodie Ashworth  ASSISTANT: NONE  ANESTHESIA: GETA EBL: <20cc  DESCRIPTION OF PROCEDURE: After a discussion of risks, benefits and alternatives, informed consent was obtained. The patient was taken to the [facility] after induction of general anesthesia by the anesthesia team, she was turned prone onto a Jackson table, all pressure points padded, SCD's placed. A timeout was taken to verify the correct patient, position, personnel, availability of appropriate equipment, and administration of perioperative antibiotics.  The lumbar area over her IPG was  widely prepped with chloraprep and draped into a sterile field.  The previous incision/scar was opened with a 10 blade and sharp dissection used to expose and deliver the SCS generator onto the field. A self-torquing wrench was used to loosen the leads and remove them from the battery. The surrounding scar tissue was fenestrated in multiple sites with the Bovie, to hopefully prevent formation of a seroma. The wiring was the carefully cleaned and placed into a new Montage IPGt.   The pocket was then copiously irrigated with bacitracin-containing irrigation.  The pocket incision was closed with a deeper layer of 2-0 vicryl interrupted sutures, and the skin closed with a running 3-0 monocryl subcuticular suture and dermabond .    Needle, sponge, and instrument counts were correct x2 at the end of the case.  The patient was then carefully awakened from anesthesia, turned supine, an abdominal binder placed.  COMPLICATIONS: NONE  CONDITION: Stable throughout the course of the procedure and immediately afterward   DISPOSITION: discharge to home, with pain medicine. Discussed care with the patient and her family. Followup in clinic will be scheduled in 10-14 days.

## 2018-01-12 NOTE — Anesthesia Procedure Notes (Signed)
Procedure Name: Intubation Date/Time: 01/12/2018 1:05 PM Performed by: [name], CRNA Pre-anesthesia Checklist: Patient identified, Emergency Drugs available, Suction available and Patient being monitored Patient Re-evaluated:Patient Re-evaluated prior to induction Oxygen Delivery Method: Circle system utilized Preoxygenation: Pre-oxygenation with 100% oxygen Induction Type: IV induction Ventilation: Mask ventilation without difficulty Laryngoscope Size: Glidescope and 3 Grade View: Grade I Tube type: Oral Tube size: 7.0 mm Number of attempts: 1 Airway Equipment and Method: Rigid stylet Placement Confirmation: ETT inserted through vocal cords under direct vision,  positive ETCO2 and breath sounds checked- equal and bilateral Secured at: 20 cm Tube secured with: Tape Dental Injury: Teeth and Oropharynx as per pre-operative assessment

## 2018-01-12 NOTE — Progress Notes (Signed)
Anesthesia made aware of Na+ 124. No new orders at this time.

## 2018-01-15 ENCOUNTER — Encounter (HOSPITAL_COMMUNITY): Payer: Self-pay | Admitting: Anesthesiology

## 2018-01-15 NOTE — Telephone Encounter (Signed)
I would recommend bevespi 2 puffs twice a day because she has had trouble tolerating powdered inhaler before

## 2018-01-16 MED ORDER — GLYCOPYRROLATE-FORMOTEROL 9-4.8 MCG/ACT IN AERO: 2.0000 | INHALATION_SPRAY | Freq: Two times a day (BID) | RESPIRATORY_TRACT | 3 refills | Status: DC

## 2018-01-16 NOTE — Telephone Encounter (Signed)
Pt husband / son are aware that lasix can cause low potassium

## 2018-01-16 NOTE — Telephone Encounter (Signed)
Spoke with Destiny Hughes and advised him that Dr. [name] wants pt to try Bevespi. He agreed and I sent the Rx to [facility]. He will let us know if there is any problems with this Rx. Nothing further is needed at this time.

## 2018-01-18 ENCOUNTER — Other Ambulatory Visit (HOSPITAL_COMMUNITY): Payer: Self-pay | Admitting: Psychiatry

## 2018-01-25 DIAGNOSIS — H35341 Macular cyst, hole, or pseudohole, right eye: Secondary | ICD-10-CM | POA: Diagnosis not present

## 2018-01-25 DIAGNOSIS — Z09 Encounter for follow-up examination after completed treatment for conditions other than malignant neoplasm: Secondary | ICD-10-CM | POA: Diagnosis not present

## 2018-01-25 DIAGNOSIS — H35342 Macular cyst, hole, or pseudohole, left eye: Secondary | ICD-10-CM | POA: Diagnosis not present

## 2018-01-29 ENCOUNTER — Other Ambulatory Visit (HOSPITAL_COMMUNITY): Payer: Self-pay

## 2018-01-29 MED ORDER — DONEPEZIL HCL 10 MG PO TABS: ORAL_TABLET | ORAL | 0 refills | Status: DC

## 2018-01-29 MED ORDER — DIVALPROEX SODIUM ER 500 MG PO TB24: ORAL_TABLET | ORAL | 0 refills | Status: DC

## 2018-01-30 ENCOUNTER — Telehealth: Payer: Self-pay | Admitting: Family Medicine

## 2018-01-30 NOTE — Telephone Encounter (Signed)
Copied from [place] [id number]. Topic: General - Other >> Jan 30, 2018 12:34 PM Lennox Solders wrote: Reason for CRM: pt husband [name] is calling the allegra 180 is not working. Pt is still having running nose.  The swelling in pt feet has gone down and husband would like to know if she still needs to take lasix if so please send refill to   [facility] [address]

## 2018-01-31 NOTE — Telephone Encounter (Signed)
Please advise 

## 2018-02-01 NOTE — Telephone Encounter (Signed)
Pt does not need to continue the lasix at this time if the swelling has improved.  Pt should be elevating her legs when sitting and using ted hose or compression socks.  Pt can try another allergy medicine if the allegra is not working.  Also consider following back up with ENT for symptoms.

## 2018-02-01 NOTE — Telephone Encounter (Signed)
Husband calling back and advised of Dr Volanda Napoleon recommendations.  He states her legs and the swelling is better.  He would like to know which allergy med Dr Volanda Napoleon would have her try, giving her health issues and other meds that she takes.  Ok to call into pharmacy as it is less with pt's insurance. Or OTC not too costly. [facility], [place] - [address] [phone] (Phone) [phone] (Fax)

## 2018-02-01 NOTE — Telephone Encounter (Signed)
Spoke with pt husband voiced understanding on Dr Volanda Napoleon recommendations/instructions

## 2018-02-05 ENCOUNTER — Ambulatory Visit (INDEPENDENT_AMBULATORY_CARE_PROVIDER_SITE_OTHER): Payer: Medicare Other | Admitting: Emergency Medicine

## 2018-02-05 ENCOUNTER — Encounter: Payer: Self-pay | Admitting: Emergency Medicine

## 2018-02-05 DIAGNOSIS — J310 Chronic rhinitis: Secondary | ICD-10-CM

## 2018-02-05 DIAGNOSIS — J449 Chronic obstructive pulmonary disease, unspecified: Secondary | ICD-10-CM

## 2018-02-05 MED ORDER — AEROCHAMBER MV MISC: 0 refills | Status: DC

## 2018-02-05 MED ORDER — LEVALBUTEROL HCL 0.63 MG/3ML IN NEBU: 0.6300 mg | INHALATION_SOLUTION | RESPIRATORY_TRACT | 5 refills | Status: DC | PRN

## 2018-02-05 NOTE — Patient Instructions (Addendum)
We will work on Environmental education officer through a prior authorization since you have not tolerated powdered inhalers, have not benefited from [place]. Until then please continue Bevespi 2 puffs twice a day.  We will add a spacer to see if this helps with delivery and gives you more benefit.  This medication is a maintenance medication, take it every day at the same time We will write a prescription for Xopenex nebulized up to every 4 hours if needed for additional shortness of breath, chest tightness, wheezing.  This medication as a rescue medication to be used if you having breathing difficulty Flu shot up-to-date, pneumonia shot up-to-date Please start loratadine (Claritin) 10 mg once daily. We will write a prescription for Astelin nasal spray, 2 sprays each nostril 2-3 times daily if needed for nasal congestion and drainage Follow with Dr [name] in 3 months or sooner if you have any problems.

## 2018-02-05 NOTE — Assessment & Plan Note (Signed)
Daily persistent drainage, certainly contributing to her dry cough and putting her at risk for flares of her COPD.  Need to treat more aggressively.  She did not get much benefit on Allegra so she has stopped this.  Please start loratadine (Claritin) 10 mg once daily. We will write a prescription for Astelin nasal spray, 2 sprays each nostril 2-3 times daily if needed for nasal congestion and drainage

## 2018-02-05 NOTE — Progress Notes (Signed)
Subjective:    Patient ID: Destiny Hughes, female    DOB: Dec 16, 1945, 72 y.o.   MRN: [id number]  HPI 72 year old former smoker with a history of depression, dementia, GERD, hyperlipidemia, hypothyroidism, chronic pain with a implanted nerve stimulator, A Fib s/p cardioversion last year.  She is been followed by Dr. Ashok Cordia in our office for very severe COPD with emphysematous change.  FEV1 0.75 L (35% predicted) 08/27/2015. Last seen here 04/2016.   Since last visit she has had a lot of trouble with delirium, probable component dementia / depression, effects of narcotics (have been weaned significantly). Has had a lot of adjustments made w assistance of psychiatry.   She and family report decreased functional capacity, more dyspnea w exertion. She has gained some weight, some edema over the last several months. She has a daily non-prod cough. A lot of clear nasal gtt, on fexofenadine.  She's been tried on breo, [place], spiriva before - either cost or side effects were barriers.   [name] 02/05/18 --this is a follow-up visit for 72 year old woman with a history of severe obstructive lung disease.  She also has depression, dementia, GERD, hypothyroidism, chronic pain with an implanted nerve stimulator, atrial fibrillation.  At her last visit we initiated a trial of Stiolto, stop Atrovent nebs.  She returns today reporting that the Stiolto was beneficial but that it was not covered by insurance.  Because the patient has had difficulty with powdered medications before we tried Bevespi.  They report that the Bevespi does not help as much as the Stiolto. She remains dyspneic. She has daily cough, non-prod. Worse with talking. Lots of rhinitis - especially in the am. No longer on allegra. She never got her xopenex. No flares, no abx or pred. Flu shot up to date. PNA shots up to date.    Review of Systems  Past Medical History:  Diagnosis Date  . Arthritis    OA- knees & back  neck  . Chronic back pain     . Cognitive retention disorder   . Collapse of right lung   . COPD (chronic obstructive pulmonary disease) (HCC)    Hx. Bronchitis occ, 01-25-11 somes issue now taking  Z-pak-started 01-24-11/Scar tissue  present  from previous lung collapse  . Dementia with psychosis ([place])   . Depression   . Depression   . Dyspnea   . Dysrhythmia 2018   treated with Ablation   . GERD (gastroesophageal reflux disease)    tx. TUMS  . Glaucoma   . Headache   . History of kidney stones    surgically treated  . Hyperlipemia   . Hypertension   . Hypothyroidism    tx. Levothyroxine  . Major depression with psychotic features ([place])   . Nephrolithiasis   . Neuromuscular disorder ([place]) 01-25-11   Pain/nerve stimulator implanted -2 yrs ago.  Marland Kitchen Reflex, gag, absent   . Reflex, gag, absent      Family History  Problem Relation Age of Onset  . Heart attack Father   . Emphysema Father   . Aneurysm Mother        CEREBRAL  . Emphysema Mother   . Dementia Mother   . Diabetes Son   . Hypertension Son   . Hypertension Son   . Cancer Sister        Widely Metastatic  . Asthma Son        x2  . Breast cancer Paternal Aunt   . Rheum arthritis Maternal Aunt  Social History   Socioeconomic History  . Marital status: Married    Spouse name: Not on file  . Number of children: 2  . Years of education: Not on file  . Highest education level: Not on file  Occupational History  . Occupation: disabled  Social Needs  . Financial resource strain: Not on file  . Food insecurity:    Worry: Not on file    Inability: Not on file  . Transportation needs:    Medical: Not on file    Non-medical: Not on file  Tobacco Use  . Smoking status: Former Smoker    Packs/day: 0.75    Years: 50.00    Pack years: 37.50    Types: Cigarettes    Start date: 09/13/1961    Last attempt to quit: 05/2016    Years since quitting: 1.7  . Smokeless tobacco: Never Used  . Tobacco comment: Decreased Down to 2/3 a Day if  Thinking Abouit It  Substance and Sexual Activity  . Alcohol use: No    Alcohol/week: 0.0 standard drinks    Comment: none in 10 years  . Drug use: No  . Sexual activity: Never  Lifestyle  . Physical activity:    Days per week: Not on file    Minutes per session: Not on file  . Stress: Not on file  Relationships  . Social connections:    Talks on phone: Not on file    Gets together: Not on file    Attends religious service: Not on file    Active member of club or organization: Not on file    Attends meetings of clubs or organizations: Not on file    Relationship status: Not on file  . Intimate partner violence:    Fear of current or ex partner: Not on file    Emotionally abused: Not on file    Physically abused: Not on file    Forced sexual activity: Not on file  Other Topics Concern  . Not on file  Social History Narrative   She is from [facility]. She has always lived in [place]. She has traveled to [place], [place], [place], [place], & [place]. 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 Prior exposure to parakeets with last exposure remote in her current home. Previously had mold in her home that was in her bathroom & fixed. Prior exposure to hot tubs but none recently. Pt lives at home with [name], husband.  Has 2 childrean, 12th grade education.       Allergies  Allergen Reactions  . Albuterol Shortness Of Breath and Other (See Comments)  . Penicillins Anaphylaxis, Swelling and Other (See Comments)    Has patient had a PCN reaction causing immediate rash, facial/tongue/throat swelling, SOB or lightheadedness with hypotension: Yes Has patient had a PCN reaction causing severe rash involving mucus membranes or skin necrosis: Rash Has patient had a PCN reaction that required hospitalization: No Has patient had a PCN reaction occurring within the last 10 years: No If all of the above answers  are "NO", then may proceed with Cephalosporin use.  . Sulfa Antibiotics Swelling and Other (See Comments)    Throat swells, but no shortness of breath noted  . Codeine Nausea And Vomiting  . Ecotrin [Aspirin] Nausea And Vomiting  . Zanaflex [Tizanidine] Other (See Comments)    Possible confusion  Outpatient Medications Prior to Visit  Medication Sig Dispense Refill  . amLODipine (NORVASC) 5 MG tablet TAKE ONE TABLET DAILY (Patient taking differently: Take 5 mg by mouth daily. ) 90 tablet 1  . aspirin EC 81 MG tablet Take 81 mg by mouth at bedtime.    . divalproex (DEPAKOTE ER) 500 MG 24 hr tablet 2  qam 60 tablet 0  . donepezil (ARICEPT) 10 MG tablet 1  qhs 30 tablet 0  . gabapentin (NEURONTIN) 100 MG capsule TAKE ONE CAPSULE TWICE A DAY (Patient taking differently: Take 100 mg by mouth 2 (two) times daily. ) 180 capsule 2  . Glycopyrrolate-Formoterol (BEVESPI AEROSPHERE) 9-4.8 MCG/ACT AERO Inhale 2 puffs into the lungs 2 (two) times daily. 1 Inhaler 3  . levothyroxine (SYNTHROID, LEVOTHROID) 125 MCG tablet TAKE ONE TABLET DAILY BEFORE BREAKFAST (Patient taking differently: Take 125 mcg by mouth at bedtime. ) 90 tablet 0  . memantine (NAMENDA) 5 MG tablet TAKE ONE TABLET DAILY (Patient taking differently: Take 5 mg by mouth daily. ) 90 tablet 3  . pantoprazole (PROTONIX) 40 MG tablet Take 40 mg by mouth daily.    . propranolol (INDERAL) 80 MG tablet TAKE ONE-HALF TABLET 3 TIMES DAILY (Patient taking differently: Take 40 mg by mouth 3 (three) times daily. ) 135 tablet 3  . sodium chloride 1 g tablet TAKE ONE TABLET THREE TIMES DAILY (Patient taking differently: Take 1 g by mouth 3 (three) times daily. ) 180 tablet 0  . temazepam (RESTORIL) 30 MG capsule Take 1 capsule (30 mg total) by mouth at bedtime as needed for sleep. 30 capsule 4  . Tiotropium Bromide-Olodaterol (STIOLTO RESPIMAT) 2.5-2.5 MCG/ACT AERS Inhale 2 puffs into the lungs daily. 1 Inhaler 2  . fexofenadine (ALLEGRA) 180 MG  tablet Take 180 mg by mouth daily.    . furosemide (LASIX) 20 MG tablet TAKE ONE TABLET DAILY (Patient taking differently: Take 20 mg by mouth daily. ) 30 tablet 0  . levalbuterol (XOPENEX) 0.63 MG/3ML nebulizer solution Take 3 mLs (0.63 mg total) by nebulization every 4 (four) hours as needed for wheezing or shortness of breath. 360 mL 5   No facility-administered medications prior to visit.         Objective:   Physical Exam Vitals:   02/05/18 0957  BP: 126/84  Pulse: 60  SpO2: 94%  Weight: 192 lb (87.1 kg)  Height: 5' 5.5" (1.664 m)   Gen: Pleasant, well-nourished, in no distress, in a wheelchair  ENT: No lesions,  mouth clear,  oropharynx clear, no postnasal drip  Neck: No JVD, no stridor  Lungs: No use of accessory muscles, decreased bilaterally, no wheezing, no crackles  Cardiovascular: RRR, heart sounds normal, no murmur or gallops, 1+ peripheral edema  Musculoskeletal: No deformities, no cyanosis or clubbing  Neuro: alert, forgetful, some pressured speech.  Moves all extremities, no apparent focal deficits  Skin: Warm, no lesions or rash     Assessment & Plan:  COPD (chronic obstructive pulmonary disease) (HCC) Severe disease with significant exertional limitation.  No flare since last time.  She benefited the most from [facility] but this was not covered by her insurance, tried [employer].  She has not benefited as well from this.  I wonder if this could be partly related to technique, we will try a spacer.  In the meantime I think we need to try to get a prior authorization for the Stiolto since it gave her the most benefit.  She has failed powdered inhalers  in the past, I do not think Anoro is a good option.  Flu shot and pneumonia shots are up-to-date   We will work on obtaining Stiolto through a prior authorization since you have not tolerated powdered inhalers, have not benefited from [place]. Until then please continue Bevespi 2 puffs twice a day.  We will add a  spacer to see if this helps with delivery and gives you more benefit.  This medication is a maintenance medication, take it every day at the same time We will write a prescription for Xopenex nebulized up to every 4 hours if needed for additional shortness of breath, chest tightness, wheezing.  This medication as a rescue medication to be used if you having breathing difficulty Flu shot up-to-date, pneumonia shot up-to-date Follow with Dr [name] in 3 months or sooner if you have any problems.  Chronic rhinitis Daily persistent drainage, certainly contributing to her dry cough and putting her at risk for flares of her COPD.  Need to treat more aggressively.  She did not get much benefit on Allegra so she has stopped this.  Please start loratadine (Claritin) 10 mg once daily. We will write a prescription for Astelin nasal spray, 2 sprays each nostril 2-3 times daily if needed for nasal congestion and drainage  Baltazar Apo, MD, PhD 02/05/2018, 10:25 AM [facility] [phone] or if no answer [phone]

## 2018-02-05 NOTE — Assessment & Plan Note (Signed)
Severe disease with significant exertional limitation.  No flare since last time.  She benefited the most from [facility] but this was not covered by her insurance, tried [employer].  She has not benefited as well from this.  I wonder if this could be partly related to technique, we will try a spacer.  In the meantime I think we need to try to get a prior authorization for the Stiolto since it gave her the most benefit.  She has failed powdered inhalers in the past, I do not think Anoro is a good option.  Flu shot and pneumonia shots are up-to-date   We will work on obtaining Stiolto through a prior authorization since you have not tolerated powdered inhalers, have not benefited from [place]. Until then please continue Bevespi 2 puffs twice a day.  We will add a spacer to see if this helps with delivery and gives you more benefit.  This medication is a maintenance medication, take it every day at the same time We will write a prescription for Xopenex nebulized up to every 4 hours if needed for additional shortness of breath, chest tightness, wheezing.  This medication as a rescue medication to be used if you having breathing difficulty Flu shot up-to-date, pneumonia shot up-to-date Follow with Dr [name] in 3 months or sooner if you have any problems.

## 2018-02-06 ENCOUNTER — Encounter: Payer: Self-pay | Admitting: Emergency Medicine

## 2018-02-06 NOTE — Telephone Encounter (Signed)
error 

## 2018-02-07 NOTE — Telephone Encounter (Signed)
Will close encounter

## 2018-02-08 ENCOUNTER — Telehealth: Payer: Self-pay | Admitting: Emergency Medicine

## 2018-02-08 NOTE — Telephone Encounter (Signed)
Per triage protocol, this message will need to be followed up on by triage.

## 2018-02-08 NOTE — Telephone Encounter (Signed)
Called and spoke to [place] and the advised that the Xopenex is not covered it needs a PA.   PA phone #-[phone]  [id number]   Part [id number] Group# [id number]  Started PA on cover my med's PA # is [id number]  I will wall husband and let him know that we started the appeal and will call once we receive an answer. He verbalized understanding nothing further is needed at this time.  Will send to [facility] for follow up.

## 2018-02-13 ENCOUNTER — Telehealth: Payer: Self-pay | Admitting: Emergency Medicine

## 2018-02-13 MED ORDER — AEROCHAMBER MV MISC: 0 refills | Status: DC

## 2018-02-13 NOTE — Telephone Encounter (Signed)
They were calling to see if we had Aero chamber avalible we did not have any when they were here for their appointment. We do have so here today I will place this in the front for pick up. Nothing further needed at this time.

## 2018-02-13 NOTE — Telephone Encounter (Signed)
Patient husband is here in the lobby and needs someone to show him how to use inhaler.

## 2018-02-13 NOTE — Telephone Encounter (Signed)
Martin Majestic out and spoke with the husbend and wife I showed them how to use the device and nothing further is needed at this time.

## 2018-02-14 ENCOUNTER — Encounter: Payer: Self-pay | Admitting: Family Medicine

## 2018-02-14 ENCOUNTER — Ambulatory Visit (INDEPENDENT_AMBULATORY_CARE_PROVIDER_SITE_OTHER): Payer: Medicare Other

## 2018-02-14 ENCOUNTER — Ambulatory Visit (INDEPENDENT_AMBULATORY_CARE_PROVIDER_SITE_OTHER): Payer: Medicare Other | Admitting: Family Medicine

## 2018-02-14 VITALS — BP 110/60 | HR 58 | Temp 98.1°F | Wt 192.0 lb

## 2018-02-14 DIAGNOSIS — J449 Chronic obstructive pulmonary disease, unspecified: Secondary | ICD-10-CM

## 2018-02-14 DIAGNOSIS — R600 Localized edema: Secondary | ICD-10-CM | POA: Diagnosis not present

## 2018-02-14 DIAGNOSIS — I10 Essential (primary) hypertension: Secondary | ICD-10-CM | POA: Diagnosis not present

## 2018-02-14 DIAGNOSIS — R251 Tremor, unspecified: Secondary | ICD-10-CM

## 2018-02-14 DIAGNOSIS — R0602 Shortness of breath: Secondary | ICD-10-CM | POA: Diagnosis not present

## 2018-02-14 DIAGNOSIS — I5032 Chronic diastolic (congestive) heart failure: Secondary | ICD-10-CM | POA: Diagnosis not present

## 2018-02-14 LAB — CBC
HEMATOCRIT: 39.1 % (ref 36.0–46.0)
HEMOGLOBIN: 12.9 g/dL (ref 12.0–15.0)
MCHC: 32.9 g/dL (ref 30.0–36.0)
MCV: 84 fl (ref 78.0–100.0)
Platelets: 367 10*3/uL (ref 150.0–400.0)
RBC: 4.65 Mil/uL (ref 3.87–5.11)
RDW: 16.3 % — ABNORMAL HIGH (ref 11.5–15.5)
WBC: 6.5 10*3/uL (ref 4.0–10.5)

## 2018-02-14 LAB — BASIC METABOLIC PANEL
BUN: 15 mg/dL (ref 6–23)
CALCIUM: 9.5 mg/dL (ref 8.4–10.5)
CO2: 34 meq/L — AB (ref 19–32)
Chloride: 89 mEq/L — ABNORMAL LOW (ref 96–112)
Creatinine, Ser: 0.87 mg/dL (ref 0.40–1.20)
GFR: 67.57 mL/min (ref >60.00)
GLUCOSE: 109 mg/dL — AB (ref 70–99)
Potassium: 4.9 mEq/L (ref 3.5–5.1)
SODIUM: 129 meq/L — AB (ref 135–145)

## 2018-02-14 LAB — BRAIN NATRIURETIC PEPTIDE: Pro B Natriuretic peptide (BNP): 166 pg/mL — ABNORMAL HIGH (ref 0.0–100.0)

## 2018-02-14 MED ORDER — FUROSEMIDE 20 MG PO TABS: 20.0000 mg | ORAL_TABLET | Freq: Every day | ORAL | 0 refills | Status: DC

## 2018-02-14 NOTE — Patient Instructions (Signed)
Preventing Heart Failure Heart failure is a condition in which the heart has trouble pumping blood. This may mean that the heart cannot pump enough blood out to the body, or that the heart does not fill up with enough blood. Either of those problems can lead to symptoms such as fatigue, trouble breathing, and swelling throughout the body. This is a common medical condition that affects not only the heart, but the entire body. Making certain nutrition and lifestyle changes can help you prevent heart failure and avoid serious health problems. What nutrition changes can be made?  If you are overweight or obese, reduce how many calories you eat each day so that you lose weight. Work with your health care provider or a diet and nutrition specialist (dietitian) to determine how many calories you need each day.  Eat foods that are low in salt (sodium). Avoid adding extra salt to foods.  Eat a well-balanced diet that includes a lot of: ? Fresh fruits and vegetables. ? Whole grains. ? Lean meats. ? Beans. ? Fat-free or low-fat dairy products.  Avoid foods that contain a lot of: ? Trans fats. ? Saturated fats. ? Sugar. ? Cholesterol. What lifestyle changes can be made?  Do not use any products that contain nicotine or tobacco, such as cigarettes and e-cigarettes. If you need help quitting or reducing how much you smoke, ask your health care provider.  Stop using alcohol, or limit alcohol intake to no more than 1 drink a day for nonpregnant women and 2 drinks a day for men. One drink equals 12 oz of beer, 5 oz of wine, or 1 oz of hard liquor.  Exercise for at least 150 minutes each week, or as much as told by your health care provider. ? Do moderate-intensity exercise, such as brisk walking, bicycling, or water aerobics. ? Ask your health care provider which activities are safe for you.  See a health care provider regularly for screening and wellness checks. Know your heart health indicators,  such as: ? Blood pressure. ? Cholesterol levels. ? Blood sugar (glucose) levels. ? Weight and BMI.  If you have diabetes, manage your condition and follow your treatment plan as instructed.  Try to get 7-9 hours of sleep each night. To help with sleep: ? Keep your bedroom cool and dark. ? Do not eat a heavy meal during the hour before you go to bed. ? Do not drink alcohol or caffeinated drinks before bed. ? Avoid screen time before bedtime. This means avoiding television, computers, tablets, and cell phones.  Find ways to relax and manage stress. These may include: ? Breathing exercises. ? Meditation. ? Yoga. ? Listening to music. Why are these changes important?  A well-balanced diet with the appropriate amount of calories can keep your body weight at a healthy level, which reduces strain on your heart.  A low-sodium diet can help keep your blood pressure in a normal range and keep your blood vessels working properly.  Quitting smoking and limiting alcohol intake can reduce harmful effects that these substances have on your heart and blood vessels.  Regular exercise can keep your heart strong so it can pump blood normally.  Managing diabetes helps your blood circulate and can help you maintain a healthy weight.  Managing stress helps to reduce the risk of high blood pressure and heart problems. What can happen if changes are not made? Heart failure can cause very serious problems that may get worse over time, such as:  Extreme  fatigue during normal physical activities.  Shortness of breath or trouble breathing.  Swelling in your abdomen, legs, ankles, feet, or neck.  Fluid buildup throughout the body.  Weight gain.  Cough.  Frequent urination.  What can I do to lower my risk? You may be able to lower your risk of heart failure by:  Losing weight or keeping your weight under control.  Working with your health care provider to manage your: ? Cholesterol. ? Blood  pressure. ? Diabetes, if this applies.  Eating a healthy diet.  Exercising regularly.  Avoiding unhealthy habits, such as smoking, drinking, or using drugs.  Getting plenty of sleep.  Managing your stress.  How is this treated? Heart failure cannot be cured except by heart transplant, but treatment can help to improve your quality of life. Treatment may include:  Medicines to help: ? Lower blood pressure. ? Remove excess sodium from your body. ? Relax blood vessels. ? Improve heart function. ? Control other symptoms of heart failure.  Surgery to open blocked coronary arteries or repair damaged heart valves.  Implantation of a biventricular pacemaker to improve heart muscle function (cardiac resynchronization therapy). This device paces both the right ventricle and left ventricle.  Implantation of a device to treat serious abnormal heart rhythms (implantable cardioverter defibrillator, ICD).  Implantation of a mechanical heart pump to improve the pumping ability of your heart (left ventricular assist device, LVAD).  Heart transplant. This treatment is considered for certain people who do not improve with other treatments.  Where to find more information:  National Heart, Lung, and Blood Institute: ClickDebate.gl  Centers for Disease Control and Prevention: LawyerNetworking.com.cy  NIH Senior Health: https://www.[place]-brown.info/  American Heart Association: ReligiousCamps.at.jsp Contact a health care provider if:  You have rapid weight gain.  You have increasing shortness of breath that is unusual for you.  You tire easily, or you are unable to participate in your usual activities.  You cough more than normal, especially with physical activity.  You have any swelling or more swelling in areas  such as your hands, feet, ankles, or abdomen. Summary  Heart failure can be prevented by making changes to your diet and your lifestyle.  It is important to eat a healthy diet, manage your weight, exercise regularly, manage stress, avoid drugs and alcohol, and keep your cholesterol and blood pressure under control.  Heart failure can cause very serious problems over time. This information is not intended to replace advice given to you by your health care provider. Make sure you discuss any questions you have with your health care provider. Document Released: 10/27/2015 Document Revised: 05/24/2016 Document Reviewed: 10/27/2015 Elsevier Interactive Patient Education  2018 [employer].  Edema Edema is an abnormal buildup of fluids in your bodytissues. Edema is somewhatdependent on gravity to pull the fluid to the lowest place in your body. That makes the condition more common in the legs and thighs (lower extremities). Painless swelling of the feet and ankles is common and becomes more likely as you get older. It is also common in looser tissues, like around your eyes. When the affected area is squeezed, the fluid may move out of that spot and leave a dent for a few moments. This dent is called pitting. What are the causes? There are many possible causes of edema. Eating too much salt and being on your feet or sitting for a long time can cause edema in your legs and ankles. Hot weather may make edema worse. Common medical causes of  edema include:  Heart failure.  Liver disease.  Kidney disease.  Weak blood vessels in your legs.  Cancer.  An injury.  Pregnancy.  Some medications.  Obesity.  What are the signs or symptoms? Edema is usually painless.Your skin may look swollen or shiny. How is this diagnosed? Your health care provider may be able to diagnose edema by asking about your medical history and doing a physical exam. You may need to have tests such as X-rays, an  electrocardiogram, or blood tests to check for medical conditions that may cause edema. How is this treated? Edema treatment depends on the cause. If you have heart, liver, or kidney disease, you need the treatment appropriate for these conditions. General treatment may include:  Elevation of the affected body part above the level of your heart.  Compression of the affected body part. Pressure from elastic bandages or support stockings squeezes the tissues and forces fluid back into the blood vessels. This keeps fluid from entering the tissues.  Restriction of fluid and salt intake.  Use of a water pill (diuretic). These medications are appropriate only for some types of edema. They pull fluid out of your body and make you urinate more often. This gets rid of fluid and reduces swelling, but diuretics can have side effects. Only use diuretics as directed by your health care provider.  Follow these instructions at home:  Keep the affected body part above the level of your heart when you are lying down.  Do not sit still or stand for prolonged periods.  Do not put anything directly under your knees when lying down.  Do not wear constricting clothing or garters on your upper legs.  Exercise your legs to work the fluid back into your blood vessels. This may help the swelling go down.  Wear elastic bandages or support stockings to reduce ankle swelling as directed by your health care provider.  Eat a low-salt diet to reduce fluid if your health care provider recommends it.  Only take medicines as directed by your health care provider. Contact a health care provider if:  Your edema is not responding to treatment.  You have heart, liver, or kidney disease and notice symptoms of edema.  You have edema in your legs that does not improve after elevating them.  You have sudden and unexplained weight gain. Get help right away if:  You develop shortness of breath or chest pain.  You  cannot breathe when you lie down.  You develop pain, redness, or warmth in the swollen areas.  You have heart, liver, or kidney disease and suddenly get edema.  You have a fever and your symptoms suddenly get worse. This information is not intended to replace advice given to you by your health care provider. Make sure you discuss any questions you have with your health care provider. Document Released: 03/07/2005 Document Revised: 08/13/2015 Document Reviewed: 12/28/2012 Elsevier Interactive Patient Education  2017 [employer].

## 2018-02-14 NOTE — Progress Notes (Signed)
Subjective:    Patient ID: Destiny Hughes, female    DOB: [birth year], 72 y.o.   MRN: [id number]  No chief complaint on file. Patient accompanied by her husband and son.  HPI Patient is a 55 yo with h/o dementia, hyponatremia, former smoker w/ COPD, HLD, hypothyroidism, h/o SVT and atrial flutter s/p cath ablation.  Pt was seen today for f/u and acute concern.  H/o CHF: -B/l LE edema, SOB, DOE, weight gain x "a while" -no longer taking lasix -pt notes adding salt to her food after it is cooked. -in the past was on sodium tablets 2/2   COPD: -following with Pulm, Dr. [name] -per pt's husband, Stiloto inhaler works great, but Market researcher cover it. -currently taking Bevespi inhaler.  Using xopenex neb soln prn  HTN: -norvasc 5 mg, propranolol 80 mg   Chronic pain: -s/p battery replacement for spinal cord stimulator -replacement MRI compatible -followed by Dr. Maryjean Ka, Neurosurgery -family feels like tremor in L hand worse since procedure.  Past Medical History:  Diagnosis Date  . Arthritis    OA- knees & back  neck  . Chronic back pain   . Cognitive retention disorder   . Collapse of right lung   . COPD (chronic obstructive pulmonary disease) (HCC)    Hx. Bronchitis occ, 01-25-11 somes issue now taking  Z-pak-started 01-24-11/Scar tissue  present  from previous lung collapse  . Dementia with psychosis ([place])   . Depression   . Depression   . Dyspnea   . Dysrhythmia 2018   treated with Ablation   . GERD (gastroesophageal reflux disease)    tx. TUMS  . Glaucoma   . Headache   . History of kidney stones    surgically treated  . Hyperlipemia   . Hypertension   . Hypothyroidism    tx. Levothyroxine  . Major depression with psychotic features ([place])   . Nephrolithiasis   . Neuromuscular disorder ([place]) 01-25-11   Pain/nerve stimulator implanted -2 yrs ago.  Marland Kitchen Reflex, gag, absent   . Reflex, gag, absent     Allergies  Allergen Reactions  . Albuterol Shortness Of  Breath and Other (See Comments)  . Penicillins Anaphylaxis, Swelling and Other (See Comments)    Has patient had a PCN reaction causing immediate rash, facial/tongue/throat swelling, SOB or lightheadedness with hypotension: Yes Has patient had a PCN reaction causing severe rash involving mucus membranes or skin necrosis: Rash Has patient had a PCN reaction that required hospitalization: No Has patient had a PCN reaction occurring within the last 10 years: No If all of the above answers are "NO", then may proceed with Cephalosporin use.  . Sulfa Antibiotics Swelling and Other (See Comments)    Throat swells, but no shortness of breath noted  . Codeine Nausea And Vomiting  . Ecotrin [Aspirin] Nausea And Vomiting  . Zanaflex [Tizanidine] Other (See Comments)    Possible confusion    ROS General: Denies fever, chills, night sweats, changes in weight, changes in appetite HEENT: Denies headaches, ear pain, changes in vision, rhinorrhea, sore throat CV: Denies CP, palpitations, SOB, orthopnea  +B/l LE edema Pulm: Denies SOB, wheezing  +cough GI: Denies abdominal pain, nausea, vomiting, diarrhea, constipation GU: Denies dysuria, hematuria, frequency, vaginal discharge Msk: Denies muscle cramps, joint pains Neuro: Denies weakness, numbness, tingling  +tremor LUE Skin: Denies rashes, bruising Psych: Denies depression, anxiety, hallucinations    Objective:    Blood pressure 110/60, pulse (!) 58, temperature 98.1 F (36.7 C), temperature source  Oral, weight 192 lb (87.1 kg), SpO2 93 %.  Gen. Pleasant, well-nourished, in no distress, normal affect.  B/l thighs appear larger than normal. HEENT: [place]/AT, face symmetric, no scleral icterus, PERRLA, nares patent without drainage Lungs: coughing, no accessory muscle use, CTAB, no wheezes or rales Cardiovascular: RRR, no m/r/g, 2 + pitting edema below the knee b/l.   Neuro:  A&Ox3, CN II-XII intact, normal gait Skin:  Warm, no lesions/ rash.    Wt  Readings from Last 3 Encounters:  02/14/18 192 lb (87.1 kg)  02/05/18 192 lb (87.1 kg)  01/12/18 190 lb (86.2 kg)    Lab Results  Component Value Date   WBC 8.2 01/10/2018   HGB 12.4 01/10/2018   HCT 38.4 01/10/2018   PLT 430 (H) 01/10/2018   GLUCOSE 121 (H) 01/12/2018   CHOL 195 04/28/2015   TRIG 188 (H) 04/28/2015   HDL 43 (L) 04/28/2015   LDLCALC 114 04/28/2015   ALT 8 01/03/2018   AST 11 01/03/2018   NA 124 (L) 01/12/2018   K 4.7 01/12/2018   CL 87 (L) 01/12/2018   CREATININE 0.88 01/12/2018   BUN 14 01/12/2018   CO2 30 01/12/2018   TSH 0.463 08/16/2017   INR 1.07 08/16/2016   HGBA1C 6.4 (H) 04/28/2015   MICROALBUR 0.4 05/22/2014    Assessment/Plan:  Bilateral lower extremity edema  -concerned 2/2 CHF exacerbation -discussed limiting sodium intake, elevating LEs when sitting, wearing TED hose. -given handout - Plan: DG Chest 2 View, Brain Natriuretic Peptide, Basic metabolic panel, furosemide (LASIX) 20 MG tablet  Chronic diastolic congestive heart failure (HCC) -discussed need to reduce sodium intake -discussed Cardiology f/u - Plan: Brain Natriuretic Peptide----Elevated at 166.  Chronic obstructive pulmonary disease, unspecified COPD type ([place])  -encouraged to f/u with Pulmonology.  Discuss cost of Stiolto -continue Programme researcher, broadcasting/film/video - Plan: DG Chest 2 View, CBC (no diff)  Essential hypertension -controlled  -lifestyle modifications -continue current meds.  Tremor -noticeable LUE tremor, worse since SCS battery replacement -given time, discussed f/u appt to further address -also advised to mention to neurosurgery.  F/u in the next few wks for tremor  Grier Mitts, MD

## 2018-02-16 ENCOUNTER — Encounter: Payer: Self-pay | Admitting: Family Medicine

## 2018-02-16 DIAGNOSIS — R251 Tremor, unspecified: Secondary | ICD-10-CM | POA: Insufficient documentation

## 2018-02-23 ENCOUNTER — Other Ambulatory Visit (HOSPITAL_COMMUNITY): Payer: Self-pay

## 2018-02-23 ENCOUNTER — Other Ambulatory Visit: Payer: Self-pay | Admitting: Family Medicine

## 2018-02-23 MED ORDER — DONEPEZIL HCL 10 MG PO TABS: ORAL_TABLET | ORAL | 0 refills | Status: DC

## 2018-03-12 ENCOUNTER — Other Ambulatory Visit: Payer: Self-pay | Admitting: Family Medicine

## 2018-03-12 DIAGNOSIS — R600 Localized edema: Secondary | ICD-10-CM

## 2018-03-19 ENCOUNTER — Other Ambulatory Visit: Payer: Self-pay | Admitting: Cardiovascular Disease

## 2018-03-26 ENCOUNTER — Other Ambulatory Visit (HOSPITAL_COMMUNITY): Payer: Self-pay | Admitting: Psychiatry

## 2018-03-26 ENCOUNTER — Other Ambulatory Visit (HOSPITAL_COMMUNITY): Payer: Self-pay

## 2018-03-29 DIAGNOSIS — G8912 Acute post-thoracotomy pain: Secondary | ICD-10-CM | POA: Diagnosis not present

## 2018-03-31 ENCOUNTER — Other Ambulatory Visit: Payer: Self-pay | Admitting: Family Medicine

## 2018-04-06 ENCOUNTER — Ambulatory Visit (INDEPENDENT_AMBULATORY_CARE_PROVIDER_SITE_OTHER): Payer: Medicare Other | Admitting: Psychiatry

## 2018-04-06 ENCOUNTER — Encounter (HOSPITAL_COMMUNITY): Payer: Self-pay | Admitting: Psychiatry

## 2018-04-06 VITALS — BP 136/75 | HR 60 | Ht 65.5 in | Wt 187.0 lb

## 2018-04-06 DIAGNOSIS — F329 Major depressive disorder, single episode, unspecified: Secondary | ICD-10-CM | POA: Diagnosis not present

## 2018-04-06 DIAGNOSIS — F03918 Unspecified dementia, unspecified severity, with other behavioral disturbance: Secondary | ICD-10-CM

## 2018-04-06 DIAGNOSIS — F0391 Unspecified dementia with behavioral disturbance: Secondary | ICD-10-CM | POA: Diagnosis not present

## 2018-04-06 MED ORDER — MEMANTINE HCL 5 MG PO TABS: ORAL_TABLET | ORAL | 4 refills | Status: DC

## 2018-04-06 MED ORDER — BUPROPION HCL ER (XL) 150 MG PO TB24: 150.0000 mg | ORAL_TABLET | ORAL | 2 refills | Status: DC

## 2018-04-06 MED ORDER — DONEPEZIL HCL 10 MG PO TABS: ORAL_TABLET | ORAL | 8 refills | Status: DC

## 2018-04-06 MED ORDER — DIVALPROEX SODIUM ER 500 MG PO TB24: ORAL_TABLET | ORAL | 5 refills | Status: DC

## 2018-04-06 MED ORDER — TEMAZEPAM 7.5 MG PO CAPS: 30.0000 mg | ORAL_CAPSULE | Freq: Every evening | ORAL | 4 refills | Status: DC | PRN

## 2018-04-06 MED ORDER — BUPROPION HCL ER (XL) 150 MG PO TB24: ORAL_TABLET | ORAL | 4 refills | Status: DC

## 2018-04-06 NOTE — Progress Notes (Signed)
Psychiatric Initial Adult Assessment   Patient Identification: Destiny Hughes MRN:  [id number] Date of Evaluation:  04/06/2018 Referral Source: [facility] Chief Complaint: Depression Today the patient presents saying that she feels depressed and does not feel as alert as usual.  Her chief complaint does seem to be more intense depression.  She also says he just does not feel alert as if she feels lethargic.  She is seen today with her son and her husband.  The patient actually is sleeping fairly well taking Restoril at night.  Her appetite is good.  Her energy level is reduced.  Her thinking and concentration is only fair.  She describes intense worthlessness.  She denies being suicidal.  In our session she cried and according to the family she is crying a lot.  She no longer is aggressive or irritable towards her husband.  Overall her agitation is much better.  Today also there are moments where she smiled and laughed which is different for her.  So while she describes persistent sadness and her family agrees with this release with me she seems to be in a good mood.  Financially she is stable.  She denies psychotic symptoms.  She denies use of alcohol or drugs.  The patient does describe mild to moderate fatigue.  She does not do that much for enjoyment other than watching TV. (Hypo) Manic Symptoms:   Anxiety Symptoms:   Psychotic Symptoms:   PTSD Symptoms:   Past Psychiatric History: none  Previous Psychotropic Medications: Yes   Substance Abuse History in the last 12 months:  No.  Consequences of Substance Abuse:   Past Medical History:  Past Medical History:  Diagnosis Date  . Arthritis    OA- knees & back  neck  . Chronic back pain   . Cognitive retention disorder   . Collapse of right lung   . COPD (chronic obstructive pulmonary disease) (HCC)    Hx. Bronchitis occ, 01-25-11 somes issue now taking  Z-pak-started 01-24-11/Scar tissue  present  from previous lung  collapse  . Dementia with psychosis ([facility])   . Depression   . Depression   . Dyspnea   . Dysrhythmia 2018   treated with Ablation   . GERD (gastroesophageal reflux disease)    tx. TUMS  . Glaucoma   . Headache   . History of kidney stones    surgically treated  . Hyperlipemia   . Hypertension   . Hypothyroidism    tx. Levothyroxine  . Major depression with psychotic features ([place])   . Nephrolithiasis   . Neuromuscular disorder ([place]) 01-25-11   Pain/nerve stimulator implanted -2 yrs ago.  Marland Kitchen Reflex, gag, absent   . Reflex, gag, absent     Past Surgical History:  Procedure Laterality Date  . A-FLUTTER ABLATION N/A 08/16/2016   Procedure: A-Flutter Ablation;  Surgeon: Evans Lance, MD;  Location: [facility];  Service: Cardiovascular;  Laterality: N/A;  . ANTERIOR CERVICAL DECOMP/DISCECTOMY FUSION     3 levels, per family  . APPENDECTOMY  01-25-11  . CARPAL TUNNEL RELEASE  01-25-11   [place].  . CERVICAL SPINE SURGERY  01-25-11   2005-multiple levels  . CHOLECYSTECTOMY  01-25-11   '07  . COLONOSCOPY WITH PROPOFOL N/A 07/23/2012   Procedure: COLONOSCOPY WITH PROPOFOL;  Surgeon: Garlan Fair, MD;  Location: [facility];  Service: Endoscopy;  Laterality: N/A;  . detatched retna Bilateral    done July & August- 2019  . EYE  SURGERY Bilateral 2019   repair of retina detachment- Dr. Zadie Rhine at surgical center   . JOINT REPLACEMENT  01-25-11   6'03 LTHA-hemi  . KNEE ARTHROPLASTY  01-25-11   '04-right, revised x2  . no gag reflex     Pt does not havea gag reflex following  siurgery to neck. Family unsure of date. Pt takes pill in apple sauce.  . SPINAL CORD STIMULATOR BATTERY EXCHANGE N/A 01/12/2018   Procedure: Spinal cord stimulator battery replacement;  Surgeon: Clydell Hakim, MD;  Location: [facility];  Service: Neurosurgery;  Laterality: N/A;  left  . SPINAL CORD STIMULATOR IMPLANT  2009 APPROX   DR. ELSNER  . THORACIC SPINE SURGERY  01-25-11   6'02- then nerve stimulator  implanted after  . TOTAL KNEE REVISION  02/01/2011   Procedure: TOTAL KNEE REVISION;  Surgeon: Mauri Pole;  Location: [facility];  Service: Orthopedics;  Laterality: Right;  . TUBAL LIGATION      Family Psychiatric History:   Family History:  Family History  Problem Relation Age of Onset  . Heart attack Father   . Emphysema Father   . Aneurysm Mother        CEREBRAL  . Emphysema Mother   . Dementia Mother   . Diabetes Son   . Hypertension Son   . Hypertension Son   . Cancer Sister        Widely Metastatic  . Asthma Son        x2  . Breast cancer Paternal Aunt   . Rheum arthritis Maternal Aunt     Social History:   Social History   Socioeconomic History  . Marital status: Married    Spouse name: Not on file  . Number of children: 2  . Years of education: Not on file  . Highest education level: Not on file  Occupational History  . Occupation: disabled  Social Needs  . Financial resource strain: Not on file  . Food insecurity:    Worry: Not on file    Inability: Not on file  . Transportation needs:    Medical: Not on file    Non-medical: Not on file  Tobacco Use  . Smoking status: Former Smoker    Packs/day: 0.75    Years: 50.00    Pack years: 37.50    Types: Cigarettes    Start date: 09/13/1961    Last attempt to quit: 05/2016    Years since quitting: 1.8  . Smokeless tobacco: Never Used  . Tobacco comment: Decreased Down to 2/3 a Day if Thinking Abouit It  Substance and Sexual Activity  . Alcohol use: No    Alcohol/week: 0.0 standard drinks    Comment: none in 10 years  . Drug use: No  . Sexual activity: Never  Lifestyle  . Physical activity:    Days per week: Not on file    Minutes per session: Not on file  . Stress: Not on file  Relationships  . Social connections:    Talks on phone: Not on file    Gets together: Not on file    Attends religious service: Not on file    Active member of club or organization: Not on file    Attends meetings of  clubs or organizations: Not on file    Relationship status: Not on file  Other Topics Concern  . Not on file  Social History Narrative   She is from [facility]. She has always lived in [place]. She has traveled  to [place], [place], [place], [place], & [place]. 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 Prior exposure to parakeets with last exposure remote in her current home. Previously had mold in her home that was in her bathroom & fixed. Prior exposure to hot tubs but none recently. Pt lives at home with [name], husband.  Has 2 childrean, 12th grade education.      Additional Social History:   Allergies:   Allergies  Allergen Reactions  . Albuterol Shortness Of Breath and Other (See Comments)  . Penicillins Anaphylaxis, Swelling and Other (See Comments)    Has patient had a PCN reaction causing immediate rash, facial/tongue/throat swelling, SOB or lightheadedness with hypotension: Yes Has patient had a PCN reaction causing severe rash involving mucus membranes or skin necrosis: Rash Has patient had a PCN reaction that required hospitalization: No Has patient had a PCN reaction occurring within the last 10 years: No If all of the above answers are "NO", then may proceed with Cephalosporin use.  . Sulfa Antibiotics Swelling and Other (See Comments)    Throat swells, but no shortness of breath noted  . Codeine Nausea And Vomiting  . Ecotrin [Aspirin] Nausea And Vomiting  . Zanaflex [Tizanidine] Other (See Comments)    Possible confusion    Metabolic Disorder Labs: Lab Results  Component Value Date   HGBA1C 6.4 (H) 04/28/2015   MPG 137 (H) 04/28/2015   MPG 134 (H) 05/21/2014   No results found for: PROLACTIN Lab Results  Component Value Date   CHOL 195 04/28/2015   TRIG 188 (H) 04/28/2015   HDL 43 (L) 04/28/2015   CHOLHDL 4.5 04/28/2015   VLDL 38 (H) 04/28/2015   LDLCALC 114 04/28/2015    LDLCALC 114 04/28/2015     Current Medications: Current Outpatient Medications  Medication Sig Dispense Refill  . amLODipine (NORVASC) 5 MG tablet TAKE ONE TABLET DAILY (Patient taking differently: Take 5 mg by mouth daily. ) 90 tablet 1  . aspirin EC 81 MG tablet Take 81 mg by mouth at bedtime.    . divalproex (DEPAKOTE ER) 500 MG 24 hr tablet 2  qam 60 tablet 5  . donepezil (ARICEPT) 10 MG tablet 1  qhs 30 tablet 8  . furosemide (LASIX) 20 MG tablet TAKE ONE TABLET DAILY 30 tablet 0  . gabapentin (NEURONTIN) 100 MG capsule TAKE ONE CAPSULE TWICE A DAY 180 capsule 2  . Glycopyrrolate-Formoterol (BEVESPI AEROSPHERE) 9-4.8 MCG/ACT AERO Inhale 2 puffs into the lungs 2 (two) times daily. 1 Inhaler 3  . levalbuterol (XOPENEX) 0.63 MG/3ML nebulizer solution Take 3 mLs (0.63 mg total) by nebulization every 4 (four) hours as needed for wheezing or shortness of breath. 360 mL 5  . levothyroxine (SYNTHROID, LEVOTHROID) 125 MCG tablet TAKE ONE TABLET DAILY BEFORE BREAKFAST (Patient taking differently: Take 125 mcg by mouth at bedtime. ) 90 tablet 0  . lisinopril (PRINIVIL,ZESTRIL) 5 MG tablet TAKE ONE TABLET EACH DAY 30 tablet 5  . memantine (NAMENDA) 5 MG tablet 1  bid 60 tablet 4  . pantoprazole (PROTONIX) 40 MG tablet Take 40 mg by mouth daily.    . pantoprazole (PROTONIX) 40 MG tablet TAKE ONE TABLET DAILY 90 tablet 0  . propranolol (INDERAL) 80 MG tablet TAKE ONE-HALF TABLET 3 TIMES DAILY (Patient taking differently: Take 40 mg by mouth 3 (three) times daily. ) 135 tablet 3  . sodium  chloride 1 g tablet TAKE ONE TABLET THREE TIMES DAILY 180 tablet 0  . Spacer/Aero-Holding Chambers (AEROCHAMBER MV) inhaler Use as instructed 1 each 0  . temazepam (RESTORIL) 7.5 MG capsule Take 4 capsules (30 mg total) by mouth at bedtime as needed for sleep. 30 capsule 4  . Tiotropium Bromide-Olodaterol (STIOLTO RESPIMAT) 2.5-2.5 MCG/ACT AERS Inhale 2 puffs into the lungs daily. 1 Inhaler 2  . buPROPion (WELLBUTRIN  XL) 150 MG 24 hr tablet Take 1 tablet (150 mg total) by mouth every morning. 30 tablet 2  . buPROPion (WELLBUTRIN XL) 150 MG 24 hr tablet 1  qam  For 1 week then 2  qam 60 tablet 4   No current facility-administered medications for this visit.     Neurologic: Headache: No Seizure: No Paresthesias:No  Musculoskeletal: Strength & Muscle Tone: decreased Gait & Station: unsteady Patient leans: N/A  Psychiatric Specialty Exam: ROS  Blood pressure 136/75, pulse 60, height 5' 5.5" (1.664 m), weight 187 lb (84.8 kg), SpO2 (!) 86 %.Body mass index is 30.65 kg/m.  General Appearance: Casual  Eye Contact:  Good  Speech:  Clear and Coherent  Volume:  Normal  Mood:  NA  Affect:  depressed  Thought Process:  Goal Directed  Orientation:  Full (Time, Place, and Person)  Thought Content:  WDL  Suicidal Thoughts:  No  Homicidal Thoughts:  No  Memory:  Immediate;   Poor  Judgement:  Fair  Insight:  Lacking  Psychomotor Activity:  Normal  Concentration:    Recall:  Poor  Fund of Knowledge:Poor  Language: Fair  Akathisia:  No  Handed:  Right  AIMS (if indicated):   Assets:  Communication Skills  ADL's:  Impaired  Cognition: Impaired,  Moderate  Sleep:      Treatment Plan Summary: Today the patient describes a new problem.  Her first problem which is new is that of persistent depression with crying.  It is associated with a drop in energy.  The patient also has somatic complaints which is consistent with geriatric depression.  I think this is very relevant as it may in fact affect her cognitive abilities.  Today we will begin her on Wellbutrin.  She will build up to a dose of 300 mg XL.  She will continue taking Restoril for sleep.  Her second problem is that of Alzheimer's dementia.  I am treating that as a second problem by giving her Aricept and Namenda.  Today we will actually increase her Namenda to a more appropriate dose of 5 mg twice daily and over the next few months we will  probably maximize it to 10 mg twice daily.  The patient's third problem is that of agitation.  The agitation in the past has almost gotten violent.  I think it could be related to her depression or to her Alzheimer's.  Nonetheless she takes Depakote which helps his agitation and her irritability.  Her third problem technically is agitation/irritability.  Her fourth problem has been that of insomnia which actually is much better taking Restoril.  This patient is not suicidal.  She is not homicidal.  I would describe her level of morbidity as being moderate.  She does describe some fatigue.  Today we reviewed her blood work including her Depakote which all appear to be in an acceptable range.

## 2018-04-09 ENCOUNTER — Telehealth: Payer: Self-pay | Admitting: Family Medicine

## 2018-04-09 ENCOUNTER — Other Ambulatory Visit: Payer: Self-pay | Admitting: Neurological Surgery

## 2018-04-09 NOTE — Telephone Encounter (Signed)
Copied from [place]. Topic: Quick Communication - See Telephone Encounter >> Apr 09, 2018  9:37 AM Blase Mess A wrote: CRM for notification. See Telephone encounter for: 04/09/18.  Patient's husband is calling regarding furosemide (LASIX) 20 MG tablet [[id number]] .  Wanted to know if wanted to the patient to continue to take the medication. Her legs are not swelling.  If Dr. Volanda Napoleon wants her to continue the patient is in need of a refill. Please advise  Patient is requesting call back to advise [phone] [facility] - [place], [place] - [address] [phone] (Phone) [phone] (Fax)

## 2018-04-09 NOTE — Telephone Encounter (Signed)
Please Advice if ok to continue taking Lasix

## 2018-04-09 NOTE — Telephone Encounter (Signed)
No need to take lasix at this time if swelling has resolved.

## 2018-04-10 ENCOUNTER — Telehealth: Payer: Self-pay

## 2018-04-10 NOTE — Telephone Encounter (Signed)
Spoke with pt husband voiced understanding of dr Volanda Napoleon recommendations in regard to pt lasix

## 2018-04-10 NOTE — Telephone Encounter (Signed)
   [facility] Pre-operative Risk Assessment    Request for surgical clearance:  1. What type of surgery is being performed? Removal of spinal cord stimulator, thoracic paddle lead and generator   2. When is this surgery scheduled? 04/27/18   3. What type of clearance is required (medical clearance vs. Pharmacy clearance to hold med vs. Both)? Pharmacy  4. Are there any medications that need to be held prior to surgery and how long? Aspirin   5. Practice name and name of physician performing surgery? [facility]   6. What is your office phone number [phone]    7.   What is your office fax number [phone]  8.   Anesthesia type (None, local, MAC, general) ? General   Mady Haagensen 04/10/2018, 9:19 AM  _________________________________________________________________   (provider comments below)

## 2018-04-11 ENCOUNTER — Other Ambulatory Visit (HOSPITAL_COMMUNITY): Payer: Self-pay

## 2018-04-11 ENCOUNTER — Other Ambulatory Visit: Payer: Self-pay | Admitting: Family Medicine

## 2018-04-11 MED ORDER — TEMAZEPAM 30 MG PO CAPS: 30.0000 mg | ORAL_CAPSULE | Freq: Every evening | ORAL | 4 refills | Status: DC | PRN

## 2018-04-11 NOTE — Telephone Encounter (Signed)
   Primary Cardiologist: Cristopher Peru, MD  Chart reviewed as part of pre-operative protocol coverage.   Patient may hold aspirin 7 days prior to her upcoming spinal cord stimulator surgery.   I will route this recommendation to the requesting party via Epic fax function and remove from the pre-op pool.  Please call with questions.  Abigail Butts, PA-C 04/11/2018, 4:17 PM

## 2018-04-12 ENCOUNTER — Other Ambulatory Visit: Payer: Self-pay | Admitting: Family Medicine

## 2018-04-12 DIAGNOSIS — J029 Acute pharyngitis, unspecified: Secondary | ICD-10-CM

## 2018-04-12 DIAGNOSIS — H6983 Other specified disorders of Eustachian tube, bilateral: Secondary | ICD-10-CM

## 2018-04-13 ENCOUNTER — Telehealth: Payer: Self-pay

## 2018-04-13 NOTE — Telephone Encounter (Signed)
Pt husband is aware that pt does not need to continue taking Lasix if she has no swelling per dr Volanda Napoleon

## 2018-04-14 ENCOUNTER — Other Ambulatory Visit: Payer: Self-pay | Admitting: Family Medicine

## 2018-04-14 DIAGNOSIS — R600 Localized edema: Secondary | ICD-10-CM

## 2018-04-18 NOTE — Pre-Procedure Instructions (Signed)
Ryleah Miramontes Levan  04/18/2018      [facility] - [place], [place] - [address] [place] [address] Phone: [phone] Fax: [phone]    Your procedure is scheduled on Friday, Feb. 7th   Report to [facility] Admitting at 10 AM             (posted surgery time 12:04p - 1:22p)   Call this number if you have problems the morning of surgery:  [phone]   Remember:   Do not eat any foods or drink any liquids after midnight, Thursday.              6-7 days prior to surgery, STOP TAKING any Vitamins, Herbal Supplements, Anti-inflammatories, Blood Thinners.    Take these medicines the morning of surgery with A SIP OF WATER : Inderal, Protonix, Namenda, Gabapentin, Norvasc, Depakote.    Do not wear jewelry - no rings or watches.  Do not wear lotions, colognes or deodorant.   Men may shave face and neck.  Do not bring valuables to the hospital.  [facility] is not responsible for any belongings or valuables.  Contacts, dentures or bridgework may not be worn into surgery.  Leave your suitcase in the car.  After surgery it may be brought to your room.  For patients admitted to the hospital, discharge time will be determined by your treatment team.  Please read over the following fact sheets that you were given. MRSA Information and Surgical Site Infection Prevention      [facility]- Preparing For Surgery  Before surgery, you can play an important role. Because skin is not sterile, your skin needs to be as free of germs as possible. You can reduce the number of germs on your skin by washing with CHG (chlorahexidine gluconate) Soap before surgery.  CHG is an antiseptic cleaner which kills germs and bonds with the skin to continue killing germs even after washing.    Oral Hygiene is also important to reduce your risk of infection.    Remember - BRUSH YOUR TEETH THE MORNING OF SURGERY WITH YOUR REGULAR TOOTHPASTE  Please do not use if  you have an allergy to CHG or antibacterial soaps. If your skin becomes reddened/irritated stop using the CHG.  Do not shave (including legs and underarms) for at least 48 hours prior to first CHG shower. It is OK to shave your face.  Please follow these instructions carefully.   1. Shower the NIGHT BEFORE SURGERY and the MORNING OF SURGERY with CHG.   2. If you chose to wash your hair, wash your hair first as usual with your normal shampoo.  3. After you shampoo, rinse your hair and body thoroughly to remove the shampoo.  4. Use CHG as you would any other liquid soap. You can apply CHG directly to the skin and wash gently with a scrungie or a clean washcloth.   5. Apply the CHG Soap to your body ONLY FROM THE NECK DOWN.  Do not use on open wounds or open sores. Avoid contact with your eyes, ears, mouth and genitals (private parts). Wash Face and genitals (private parts)  with your normal soap.  6. Wash thoroughly, paying special attention to the area where your surgery will be performed.  7. Thoroughly rinse your body with warm water from the neck down.  8. DO NOT shower/wash with your normal soap after using and rinsing off the CHG Soap.  9.  Pat yourself dry with a CLEAN TOWEL.  10. Wear CLEAN PAJAMAS to bed the night before surgery, wear comfortable clothes the morning of surgery  11. Place CLEAN SHEETS on your bed the night of your first shower and DO NOT SLEEP WITH PETS.  Day of Surgery:  Do not apply any deodorants/lotions.  Please wear clean clothes to the hospital/surgery center.    Remember to brush your teeth WITH YOUR REGULAR TOOTHPASTE.

## 2018-04-19 ENCOUNTER — Encounter (HOSPITAL_COMMUNITY): Payer: Self-pay

## 2018-04-19 ENCOUNTER — Other Ambulatory Visit: Payer: Self-pay

## 2018-04-19 ENCOUNTER — Encounter (HOSPITAL_COMMUNITY): Payer: Self-pay | Admitting: Physician Assistant

## 2018-04-19 ENCOUNTER — Encounter (HOSPITAL_COMMUNITY)
Admission: RE | Admit: 2018-04-19 | Discharge: 2018-04-19 | Disposition: A | Discharge status: Home / Self Care | Payer: Medicare Other | Source: Ambulatory Visit | Attending: Neurological Surgery | Admitting: Neurological Surgery

## 2018-04-19 DIAGNOSIS — Z01818 Encounter for other preprocedural examination: Secondary | ICD-10-CM | POA: Insufficient documentation

## 2018-04-19 LAB — BASIC METABOLIC PANEL
Anion gap: 11 (ref 5–15)
BUN: 6 mg/dL — ABNORMAL LOW (ref 8–23)
CALCIUM: 8.8 mg/dL — AB (ref 8.9–10.3)
CO2: 29 mmol/L (ref 22–32)
CREATININE: 0.83 mg/dL (ref 0.44–1.00)
Chloride: 85 mmol/L — ABNORMAL LOW (ref 98–111)
GFR calc Af Amer: >60 mL/min (ref >60)
GFR calc non Af Amer: >60 mL/min (ref >60)
Glucose, Bld: 116 mg/dL — ABNORMAL HIGH (ref 70–99)
Potassium: 3.9 mmol/L (ref 3.5–5.1)
Sodium: 125 mmol/L — ABNORMAL LOW (ref 135–145)

## 2018-04-19 LAB — CBC
HCT: 40.8 % (ref 36.0–46.0)
Hemoglobin: 13.5 g/dL (ref 12.0–15.0)
MCH: 27.8 pg (ref 26.0–34.0)
MCHC: 33.1 g/dL (ref 30.0–36.0)
MCV: 84.1 fL (ref 80.0–100.0)
Platelets: 329 10*3/uL (ref 150–400)
RBC: 4.85 MIL/uL (ref 3.87–5.11)
RDW: 14.5 % (ref 11.5–15.5)
WBC: 9.8 10*3/uL (ref 4.0–10.5)
nRBC: 0 % (ref 0.0–0.2)

## 2018-04-19 LAB — SURGICAL PCR SCREEN
MRSA, PCR: NEGATIVE
Staphylococcus aureus: NEGATIVE

## 2018-04-19 NOTE — Progress Notes (Addendum)
PCP -  Cardiologist -  EP  Dr. Crissie Sickles  Chest x-ray - 01/2018  EKG - 03/2017  Stress Test - 2017  ECHO - 07/2016  Cardiac Cath -  N/A  Sleep Study - N/A CPAP - N/A  Fasting Blood Sugar -  Checks Blood Sugar _____ times a day  Blood Thinner Instructions:  Aspirin Instructions: to stop 7 days prior to surgery  (last dose 1/24 per spouse)  Anesthesia review: yes  Patient denies shortness of breath, fever, cough and chest pain at PAT appointment   Patient verbalized understanding of instructions that were given to them at the PAT appointment. Patient was also instructed that they will need to review over the PAT instructions again at home before surgery.  Does have some sinus drainage, clear, no temp.

## 2018-04-20 NOTE — Telephone Encounter (Signed)
Pt is taking the Rx as needed and not daily. Not due for refills

## 2018-04-20 NOTE — Progress Notes (Addendum)
Anesthesia Chart Review:  Case:  [id number] Date/Time:  04/27/18 1145   Procedure:  Removal of spinal cord stimulator, thoracic paddle lead and generator (N/A ) - Removal of spinal cord stimulator, thoracic paddle lead and generator   Anesthesia type:  General   Pre-op diagnosis:  Post thoracotomy pain syndrome   Location:  [facility] OR ROOM 21 / [place] [facility]   Surgeon:  Kristeen Miss, MD      DISCUSSION: Patient is a 73 year old female scheduled for the above procedure. She is s/p removal of[place], inplant [employer] Montage MRI compatible SCS generator on 01/12/18. She had issues with hyponatremia prior to this surgery (see my previous note 01/05/18 for more details).    History includes COPD (severe), former smoker (quit 05/19/16), HTN, afib/flutter (had been on b-blocker and digoxin for palpitations, but discontinued ~ 07/01/16 for bradycardia, readmit 07/11/16 with SVT/aflutter; s/p ablation 08/16/16), HLD, hypothyroidism, glaucoma, GERD, dementia/cognitive retention disorder, major depression with psychotic features, abnormal ("absent" gag reflex), chronic pain syndrome, spinal cord stimulator '09.    Roby Lofts, PA-C with [facility] gave permission to hold ASA 7 days prior to surgery. However, she has new lateral T wave abnormality, so we will need additional cardiology input as per discussion with anesthesiologist Myrtie Soman, MD. I discussed this with Destiny Hughes. He and patient reported that she has not had any chest pain.   As above, patient had issues with hyponatremia back in 12/2017. She was up to 129 at her last PCP visit on 02/14/18, but was started on Lasix for some concern of diastolic HF exacerbation. Patient has been on daily Lasix since then, but on 04/13/18 Dr. Volanda Napoleon felt it could be stopped if edema was better. Unfortunately, Na is back down to 125. I reviewed this with Dr. Kalman Shan. Labs suggest chronic hyponatremia, so if asymptomatic he  recommended confirming patient is holding Lasix and continuing NaCl tablets. She will also need close follow-up. I called and spoke with Destiny Hughes the husband and caregiver of Destiny Hughes. He confirms that Lasix has been on hold for 2 days and that she takes 3-4 NaCl tablets daily. She uses salt liberally. He says she is not drinking excessive amounts of fluid, but discussed limiting fluid to ~ 1000 mL/day over the next 24-48 hours while waiting further input from PCP or covering provider. He thinks she is starting to develop some edema now that Lasix is back on hold. She has chronic DOE with minimal exertion which he says has not changed recently but has gradually gotten worse over the past six months. She has underlying confusion. She did have an "upset stomach" and poor appetite earlier this week when on an over-there-counter anti-histamine, but this was stopped a few days ago, but symptoms and her appetite have improved. He believes Dr. Carita Pian office has a Saturday Clinic, so he was advised to contact them on 04/21/18 for an appointment or at least inquire about further instructions regarding management of hyponatremia. Dr. Volanda Napoleon had recently told them to hold Lasix, but if now swelling is starting to return then may need to reconsider with also taking her hyponatremia into consideration. In the interim, I did review symptoms that would warrant further evaluation in the ED such as chest pain, worsening SOB or edema, N/V, lethargy, change in baseline mental status, muscle cramping/twitching, headaches, loss of appetite.   I will contact Dr. Clarice Pole office during regular business hours next week to confirm he has reviewed labs and discuss  need for additional cardiology input. Will follow-up weekend events to ensure follow-up arrangement have been made for hyponatremia.   ADDENDUM 04/24/18 1:54 PM: I contacted Dr. Volanda Napoleon' office on 04/23/18 regarding Na 125, and staff forwarded message for her review.  Also Janett Billow at Dr. Clarice Pole office updated that hyponatremia should be improving prior to surgery and that additional cardiology input would be needed given EKG change. She contacted Dr. Tanna Furry office who wants patient re-evaluated. Patient also now has an appointment with Dr. [name] on 04/26/18 due to exertional dyspnea, rhinorrhea, cough. Per Janett Billow, surgery will be delayed until these issues further addressed.    VS: BP (!) 159/73   Pulse (!) 58   Temp 36.7 C   Resp 20   Ht 5\' 6"  (1.676 m)   Wt 85.9 kg   SpO2 93%   BMI 30.57 kg/m    PROVIDERS: Billie Ruddy, MD is PCP. Last visit 02/14/18. Some concern for diastolic CHF exacerbation at that time and was started on Lasix (by notes, discontinued 04/13/18).   Baltazar Apo, MD is pulmonologist. Last visit 02/05/18. At previous visit she was started on Stiolto which was beneficial but unfortunately not covered by her insusrance.     Cristopher Peru, MD is EP cardiologist Last visit with Chanetta Marshall, NP on 10/20/17. (Previously had been seeing Skeet Latch, MD for primary cardiology, but has primarily been seeing EP since ablation 2018.) She had no recurrent SVT. Her [place] were previously discontinued per Dr. Lovena Le. She was referred back to pulmonology. Andrey Spearman, MD is neurologist. Last visit 08/22/16.  Norma Fredrickson, MD is psychiatrist. Last Progress Note dated 04/06/18.     LABS: Preoperative labs reviewed. Hyponatremia as discussed above.  (all labs ordered are listed, but only abnormal results are displayed)  Labs Reviewed  BASIC METABOLIC PANEL - Abnormal; Notable for the following components:      Result Value   Sodium 125 (*)    Chloride 85 (*)    Glucose, Bld 116 (*)    BUN 6 (*)    Calcium 8.8 (*)    All other components within normal limits  SURGICAL PCR SCREEN  CBC   Sodium  Date Value  04/19/2018 125 mmol/L (L)  02/14/2018 129 mEq/L (L)  01/12/2018 124 mmol/L (L)  01/10/2018 126 mmol/L (L)   01/10/2018 124 mmol/L (L)  01/03/2018 126 mmol/L (L)  11/24/2017 133 mEq/L (L)  08/16/2017 131 mmol/L (L)  12/23/2016 136 (A)  09/05/2016 139 mmol/L  07/13/2016 129 (A)  06/02/2016 129 (A)    PFT: - 08/27/15: FVC 1.09 L (34%) FEV1 0.75 L (31%) FEV1/FVC 0.69 FEF 25-75 0.49 L (25%). DLCO uncorrected 42%.  - 12/26/14: FVC 1.23 L (38%) FEV1 0.78 L (32%) FEV1/FVC 0.64 FEF 25-75 0.43 L (21%) TLC 4.96 L (92%) RV 128% ERV 27% DLCO uncorrected 51%   IMAGES: CXR 02/14/18: IMPRESSION: Hyperinflated lungs.  Scarring at the lung bases.   EKG: 04/19/18: SB at 51 bpm with first degree AV block. LAD. Cannot rule out anterior infarct (age undetermined). ST/T wave abnormality, consider lateral ischemia. New since 11/21/16 tracing and new in V4-5 and more pronounced in V6 when compared to 04/18/17 tracing.   04/18/17: SR with marked sinus arrhythmia with first degree AV block. Anterior infarct (age undetermined).   CV: TTE 11/05/16 ([place]): Summary:  The left ventricle is normal in size. There is no thrombus. There is normal left ventricular wall thickness. Left ventricular systolic function is  normal. Ejection Fraction = >55%. No regional wall motion abnormalities noted.  The left atrial size is normal. Right atrial size is normal. The interatrial septum is intact with no evidence for an atrial septal defect. Trace mitral regurgitation. Mild tricuspid regurgitation. Right ventricular systolic pressure is elevated at 30- 76mmHg. (Comparison TTE 09/01/15: LVEF 60-65%, normal wall thickness, normal wall motion, stage 2 DD with elevated LV filling pressure, mild MR, normal LA size,mild RAE, dilated RV with reduced systolic function, dilated IVC,no TR.)  TEE 08/16/16: Study Conclusions - Aortic valve: No evidence of vegetation. - Mitral valve: There was mild regurgitation. - Left atrium: No evidence of thrombus in the atrial cavity or appendage. - Tricuspid valve: No evidence of  vegetation.   EP study/radiofrequency catheter ablation 08/16/16: CONCLUSIONS:  1. Isthmus-dependent right atrial flutter upon presentation.  2. Successful radiofrequency ablation of atrial flutter along the cavotricuspid isthmus with complete bidirectional isthmus block achieved.  3. Successful catheter ablation of inducible AVNRT 4. No inducible arrhythmias following ablation.  5. The patient had transient AV block after the ablation which resolved and did not return. 21 day Event monitor 07/2016: Atrial flutter and atrial fibrillation noted PVCs  Carotid U/S 05/13/16: Impressions: Heterogeneous plaque, bilaterally. Stable 1-39% bilateral ICA stenosis. Normal subclavian arteries, bilaterally. Patent vertebral arteries with antegrade flow.  Nuclear stress test 09/08/15:  Nuclear stress EF: 62%.  There was no ST segment deviation noted during stress.  The study is normal.  This is a low risk study.  The left ventricular ejection fraction is normal (55-65%).   Past Medical History:  Diagnosis Date  . Arthritis    OA- knees & back  neck  . Chronic back pain   . Cognitive retention disorder   . Collapse of right lung   . COPD (chronic obstructive pulmonary disease) (HCC)    Hx. Bronchitis occ, 01-25-11 somes issue now taking  Z-pak-started 01-24-11/Scar tissue  present  from previous lung collapse  . Dementia with psychosis ([place])   . Depression   . Depression   . Dyspnea   . Dysrhythmia 2018   treated with Ablation   . GERD (gastroesophageal reflux disease)    tx. TUMS  . Glaucoma   . Headache   . History of kidney stones    surgically treated  . Hyperlipemia   . Hypertension   . Hypothyroidism    tx. Levothyroxine  . Major depression with psychotic features ([place])   . Nephrolithiasis   . Neuromuscular disorder ([place]) 01-25-11   Pain/nerve stimulator implanted -2 yrs ago.  Marland Kitchen Reflex, gag, absent   . Reflex, gag, absent     Past Surgical History:  Procedure  Laterality Date  . A-FLUTTER ABLATION N/A 08/16/2016   Procedure: A-Flutter Ablation;  Surgeon: Evans Lance, MD;  Location: [facility];  Service: Cardiovascular;  Laterality: N/A;  . ANTERIOR CERVICAL DECOMP/DISCECTOMY FUSION     3 levels, per family  . APPENDECTOMY  01-25-11  . CARPAL TUNNEL RELEASE  01-25-11   Bil.  . CERVICAL SPINE SURGERY  01-25-11   2005-multiple levels  . CHOLECYSTECTOMY  01-25-11   '07  . COLONOSCOPY WITH PROPOFOL N/A 07/23/2012   Procedure: COLONOSCOPY WITH PROPOFOL;  Surgeon: Garlan Fair, MD;  Location: [facility];  Service: Endoscopy;  Laterality: N/A;  . detatched retna Bilateral    done July & August- 2019  . EYE SURGERY Bilateral 2019   repair of retina detachment- Dr. Zadie Rhine at surgical center   . JOINT  REPLACEMENT  01-25-11   6'03 LTHA-hemi  . KNEE ARTHROPLASTY  01-25-11   '04-right, revised x2  . no gag reflex     Pt does not havea gag reflex following  siurgery to neck. Family unsure of date. Pt takes pill in apple sauce.  . SPINAL CORD STIMULATOR BATTERY EXCHANGE N/A 01/12/2018   Procedure: Spinal cord stimulator battery replacement;  Surgeon: Clydell Hakim, MD;  Location: [facility];  Service: Neurosurgery;  Laterality: N/A;  left  . SPINAL CORD STIMULATOR IMPLANT  2009 APPROX   DR. ELSNER  . THORACIC SPINE SURGERY  01-25-11   6'02- then nerve stimulator implanted after  . TOTAL KNEE REVISION  02/01/2011   Procedure: TOTAL KNEE REVISION;  Surgeon: Mauri Pole;  Location: [facility];  Service: Orthopedics;  Laterality: Right;  . TUBAL LIGATION      MEDICATIONS: . acetaminophen (TYLENOL) 325 MG tablet  . amLODipine (NORVASC) 5 MG tablet  . aspirin EC 81 MG tablet  . buPROPion (WELLBUTRIN XL) 150 MG 24 hr tablet  . buPROPion (WELLBUTRIN XL) 150 MG 24 hr tablet  . divalproex (DEPAKOTE ER) 500 MG 24 hr tablet  . donepezil (ARICEPT) 10 MG tablet  . fluticasone (FLONASE) 50 MCG/ACT nasal spray  . furosemide (LASIX) 20 MG tablet  .  gabapentin (NEURONTIN) 100 MG capsule  . Glycopyrrolate-Formoterol (BEVESPI AEROSPHERE) 9-4.8 MCG/ACT AERO  . levalbuterol (XOPENEX) 0.63 MG/3ML nebulizer solution  . levothyroxine (SYNTHROID, LEVOTHROID) 125 MCG tablet  . lisinopril (PRINIVIL,ZESTRIL) 5 MG tablet  . memantine (NAMENDA) 5 MG tablet  . pantoprazole (PROTONIX) 40 MG tablet  . propranolol (INDERAL) 80 MG tablet  . sodium chloride 1 g tablet  . Spacer/Aero-Holding Chambers (AEROCHAMBER MV) inhaler  . temazepam (RESTORIL) 30 MG capsule  . Tiotropium Bromide-Olodaterol (STIOLTO RESPIMAT) 2.5-2.5 MCG/ACT AERS   No current facility-administered medications for this encounter.   Last ASA 04/13/18.   Myra Gianotti, PA-C Surgical Short Stay/Anesthesiology [facility] Phone [phone] [facility] Phone [phone] 04/20/2018 7:00 PM

## 2018-04-23 ENCOUNTER — Telehealth: Payer: Self-pay | Admitting: Family Medicine

## 2018-04-23 ENCOUNTER — Telehealth: Payer: Self-pay | Admitting: Internal Medicine

## 2018-04-23 NOTE — Telephone Encounter (Signed)
Please advise 

## 2018-04-23 NOTE — Telephone Encounter (Signed)
Follow Up:    Anderson Malta, from Dr Clarice Pole Office called. She said pt had an EKG on Friday and it was abnormal. She wants you to review it in Epic and let her know what you think please.

## 2018-04-23 NOTE — Telephone Encounter (Signed)
Copied from [place] [id number]. Topic: Quick Communication - See Telephone Encounter >> Apr 23, 2018  9:38 AM Ahmed Prima L wrote: CRM for notification. See Telephone encounter for: 04/23/18.  Ebony Hail, [place] with Short Stay @ [facility] called and wanted to let Dr Volanda Napoleon know her sodium was 125 on 1/30. She was unsure if Dr Volanda Napoleon was aware of this.  Call back @ [phone]

## 2018-04-23 NOTE — Telephone Encounter (Addendum)
[name] husband calling to advise pt's swelling has returned in her legs.  He states when he takes her socks off, there are indentations.  He states her feet do not look swollen. If Dr Volanda Napoleon thinks she should resume, pt will need a new Rx for  furosemide (LASIX) 20 MG tablet  [facility] - [place], [place] [address] [phone] (Phone) [phone] (Fax)   Pt is having surgery on Friday.

## 2018-04-23 NOTE — Telephone Encounter (Signed)
Please advise, pt has a refill for lasix in the pharmacy

## 2018-04-23 NOTE — Telephone Encounter (Signed)
Ok to take lasix 20 mg for the next 2 days then stop.  Pt should be elevating feet when sitting.

## 2018-04-24 ENCOUNTER — Telehealth: Payer: Self-pay | Admitting: Emergency Medicine

## 2018-04-24 NOTE — Telephone Encounter (Signed)
lmtcb x1 for pt. 

## 2018-04-24 NOTE — Telephone Encounter (Signed)
Yes. Please try to get the [place] started.

## 2018-04-24 NOTE — Telephone Encounter (Signed)
Called patient, patients husband picked up and advised that we had to speak to him. Advised patients husband of response below. He stated that he would contact the insurance and call us back.

## 2018-04-24 NOTE — Telephone Encounter (Signed)
Called patients insurance company [employer] [place]. Spoke with [place], she stated that the patient did not require a PA for Stiolto as they follow medicare guidelines. Patient would need this ran through her part B insurance for the medication. Per patient she does not have part B she only have part F through [employer] [place].   [place] please advise, thank you.

## 2018-04-24 NOTE — Telephone Encounter (Signed)
Spoke with pt husband voiced understanding that lasix was refilled and dr Volanda Napoleon advises pt to take it for the next 2 days and then stop and to have pt elevate her feet while sitting

## 2018-04-24 NOTE — Telephone Encounter (Addendum)
Dr. Lovena Le reviewed EKG and feels the patient needs to be seen prior to clearing for surgery.  Please notify Dr. Clarice Pole office and schedule patient an appointment with APP for clearance.

## 2018-04-24 NOTE — Telephone Encounter (Signed)
Patient will need to call her insurance and speak with them about the cost of medication and coverage. She can call back once she finds out and we can send the stiolto through for her or try a different option.

## 2018-04-24 NOTE — Telephone Encounter (Signed)
It appears that this patient was cleared for surgery by Roby Lofts on 04/11/18.   Since that time, our office has been asked to review an EKG from an outside office.   I will forward to Dr. Lovena Le for review.

## 2018-04-24 NOTE — Telephone Encounter (Signed)
Spoke with pt's husband (DPR).  He stated she was SOB with exertion, clear drainage from nose, cough with clear mucus.  Feels that Destiny Hughes is not effective and worried about surgery on 04/27/18.  Has appointment on 04/26/18.  At last office visit in November, Dr. [name] mentioned a PA for stiolto and does not appear to be started.  Would you like [place] to start one?  Send to physician of the morning as DR. Byrum is not available.

## 2018-04-24 NOTE — Telephone Encounter (Signed)
SPOKE WITH HUSBAND DPR  TO SCHEDULE CLEARANCE APPT  WITH HAMMOND 05-03-18 AT 10:30 PM PT ASKED TO ARRIVE 15 MINS BEFORE TIME

## 2018-04-26 ENCOUNTER — Encounter: Payer: Self-pay | Admitting: Emergency Medicine

## 2018-04-26 ENCOUNTER — Ambulatory Visit (INDEPENDENT_AMBULATORY_CARE_PROVIDER_SITE_OTHER): Payer: Medicare Other | Admitting: Emergency Medicine

## 2018-04-26 VITALS — BP 136/76 | HR 57 | Temp 97.9°F | Ht 65.5 in | Wt 188.0 lb

## 2018-04-26 DIAGNOSIS — J449 Chronic obstructive pulmonary disease, unspecified: Secondary | ICD-10-CM

## 2018-04-26 MED ORDER — TIOTROPIUM BROMIDE-OLODATEROL 2.5-2.5 MCG/ACT IN AERS: 2.0000 | INHALATION_SPRAY | Freq: Every day | RESPIRATORY_TRACT | 0 refills | Status: DC

## 2018-04-26 MED ORDER — TIOTROPIUM BROMIDE-OLODATEROL 2.5-2.5 MCG/ACT IN AERS: 2.0000 | INHALATION_SPRAY | Freq: Every day | RESPIRATORY_TRACT | 11 refills | Status: DC

## 2018-04-26 MED ORDER — IPRATROPIUM BROMIDE 0.03 % NA SOLN: 2.0000 | Freq: Two times a day (BID) | NASAL | 12 refills | Status: DC

## 2018-04-26 NOTE — Progress Notes (Signed)
Subjective:    Patient ID: Lyndel Pleasure, female    DOB: [birth year], 73 y.o.   MRN: [id number]  HPI 73 year old former smoker with a history of depression, dementia, GERD, hyperlipidemia, hypothyroidism, chronic pain with a implanted nerve stimulator, A Fib s/p cardioversion last year.  She is been followed by Dr. Ashok Cordia in our office for very severe COPD with emphysematous change.  FEV1 0.75 L (35% predicted) 08/27/2015. Last seen here 04/2016.   Since last visit she has had a lot of trouble with delirium, probable component dementia / depression, effects of narcotics (have been weaned significantly). Has had a lot of adjustments made w assistance of psychiatry.   She and family report decreased functional capacity, more dyspnea w exertion. She has gained some weight, some edema over the last several months. She has a daily non-prod cough. A lot of clear nasal gtt, on fexofenadine.  She's been tried on breo, [place], spiriva before - either cost or side effects were barriers.   [name] 02/05/18 --this is a follow-up visit for 73 year old woman with a history of severe obstructive lung disease.  She also has depression, dementia, GERD, hypothyroidism, chronic pain with an implanted nerve stimulator, atrial fibrillation.  At her last visit we initiated a trial of Stiolto, stop Atrovent nebs.  She returns today reporting that the Stiolto was beneficial but that it was not covered by insurance.  Because the patient has had difficulty with powdered medications before we tried Bevespi.  They report that the Bevespi does not help as much as the Stiolto. She remains dyspneic. She has daily cough, non-prod. Worse with talking. Lots of rhinitis - especially in the am. No longer on allegra. She never got her xopenex. No flares, no abx or pred. Flu shot up to date. PNA shots up to date.   Acute OV 04/26/18 --Mr. Fisher is 29 and has a history of severe COPD, atrial fibrillation, depression, GERD,  hypothyroidism, chronic rhinitis.  At her last visit we tried getting a prior authorization for Stiolto which has been the most helpful bronchodilator.  She is currently on bevespi. ? Whether she has an albuterol HFA right now.  For her chronic rhinitis we tried changing Allegra to loratadine, added Atrovent nasal spray - she never got it, ? why. Never got Stiolto so she is back on bevespi.  She is here describing a lot of nasal congestion   Review of Systems      Objective:   Physical Exam Vitals:   04/26/18 1147  BP: 136/76  Pulse: (!) 57  Temp: 97.9 F (36.6 C)  TempSrc: Oral  SpO2: 93%  Weight: 188 lb (85.3 kg)  Height: 5' 5.5" (1.664 m)   Gen: Pleasant, well-nourished, in no distress, in a wheelchair  ENT: No lesions,  mouth clear,  oropharynx clear, no postnasal drip  Neck: No JVD, no stridor  Lungs: No use of accessory muscles, decreased bilaterally, no wheezing, no crackles  Cardiovascular: RRR, heart sounds normal, no murmur or gallops, 1+ peripheral edema  Musculoskeletal: No deformities, no cyanosis or clubbing  Neuro: alert, forgetful, some pressured speech.  Moves all extremities, no apparent focal deficits  Skin: Warm, no lesions or rash     Assessment & Plan:  COPD (chronic obstructive pulmonary disease) (HCC) We will work on trying to get [employer] approved by [employer].  In the meantime we will give you samples of this.  Please use 2 puffs once daily.  This replaces your Bevespi.  Stop  the [place] now. Use albuterol 2 puffs up to every 4 hours if needed for shortness of breath, chest tightness, wheezing.  We will give you a prescription for this. Follow with APP in 2 to 3 weeks to assess status of the Stiolto prior authorization with your insurance Follow with Dr [name] in 3 months or sooner if you have any problems.  Chronic rhinitis Continue to use Benadryl as needed Continue loratadine 10 mg daily (Claritin). We will reorder ipratropium nasal spray,  use 2 sprays each nostril 2-3 times daily as needed for nasal congestion and drainage.   Baltazar Apo, MD, PhD 04/26/2018, 12:12 PM [facility] [phone] or if no answer [phone]

## 2018-04-26 NOTE — Assessment & Plan Note (Signed)
Continue to use Benadryl as needed Continue loratadine 10 mg daily (Claritin). We will reorder ipratropium nasal spray, use 2 sprays each nostril 2-3 times daily as needed for nasal congestion and drainage.

## 2018-04-26 NOTE — Assessment & Plan Note (Signed)
We will work on trying to get [employer] approved by [employer].  In the meantime we will give you samples of this.  Please use 2 puffs once daily.  This replaces your Bevespi.  Stop the Bevespi now. Use albuterol 2 puffs up to every 4 hours if needed for shortness of breath, chest tightness, wheezing.  We will give you a prescription for this. Follow with APP in 2 to 3 weeks to assess status of the Stiolto prior authorization with your insurance Follow with Dr [name] in 3 months or sooner if you have any problems.

## 2018-04-26 NOTE — Patient Instructions (Signed)
We will work on trying to get [employer] approved by [employer].  In the meantime we will give you samples of this.  Please use 2 puffs once daily.  This replaces your Bevespi.  Stop the Bevespi now. Use albuterol 2 puffs up to every 4 hours if needed for shortness of breath, chest tightness, wheezing.  We will give you a prescription for this. Continue to use Benadryl as needed Continue loratadine 10 mg daily (Claritin). We will reorder ipratropium nasal spray, use 2 sprays each nostril 2-3 times daily as needed for nasal congestion and drainage. Follow with APP in 2 to 3 weeks to assess status of the Stiolto prior authorization with your insurance Follow with Dr [name] in 3 months or sooner if you have any problems.

## 2018-04-27 ENCOUNTER — Ambulatory Visit (HOSPITAL_COMMUNITY): Admission: RE | Admit: 2018-04-27 | Payer: Medicare Other | Source: Home / Self Care | Admitting: Neurological Surgery

## 2018-04-27 SURGERY — LUMBAR SPINAL CORD STIMULATOR REMOVAL
Anesthesia: General

## 2018-04-30 ENCOUNTER — Other Ambulatory Visit: Payer: Self-pay | Admitting: Family Medicine

## 2018-05-02 ENCOUNTER — Telehealth: Payer: Self-pay | Admitting: Pulmonary Disease

## 2018-05-02 DIAGNOSIS — J449 Chronic obstructive pulmonary disease, unspecified: Secondary | ICD-10-CM

## 2018-05-02 MED ORDER — UMECLIDINIUM-VILANTEROL 62.5-25 MCG/INH IN AEPB: 1.0000 | INHALATION_SPRAY | Freq: Every day | RESPIRATORY_TRACT | Status: DC

## 2018-05-02 NOTE — Telephone Encounter (Signed)
rec'd fax from [place] requesting refill on Stiolto Respimat 2.5 Called and spoke with patient regarding fax Pt states stiolto respimat is not being covered under her insurance, and working well for her Pt tried [employer], did not work well for her at all VS advised to call in Anoro for pt Pt verbalized that she will con't samples of stiolto respimat 2.5 then try Anoro. Nothing further needed.

## 2018-05-03 ENCOUNTER — Encounter: Payer: Self-pay | Admitting: Cardiology

## 2018-05-03 ENCOUNTER — Ambulatory Visit (INDEPENDENT_AMBULATORY_CARE_PROVIDER_SITE_OTHER): Payer: Medicare Other | Admitting: Cardiology

## 2018-05-03 VITALS — BP 150/74 | HR 64 | Ht 65.5 in | Wt 188.4 lb

## 2018-05-03 DIAGNOSIS — R9431 Abnormal electrocardiogram [ECG] [EKG]: Secondary | ICD-10-CM

## 2018-05-03 DIAGNOSIS — E039 Hypothyroidism, unspecified: Secondary | ICD-10-CM | POA: Diagnosis not present

## 2018-05-03 DIAGNOSIS — I10 Essential (primary) hypertension: Secondary | ICD-10-CM | POA: Diagnosis not present

## 2018-05-03 DIAGNOSIS — I471 Supraventricular tachycardia: Secondary | ICD-10-CM | POA: Diagnosis not present

## 2018-05-03 DIAGNOSIS — Z01810 Encounter for preprocedural cardiovascular examination: Secondary | ICD-10-CM | POA: Diagnosis not present

## 2018-05-03 NOTE — Patient Instructions (Signed)
Medication Instructions:  Your physician recommends that you continue on your current medications as directed. Please refer to the Current Medication list given to you today.  If you need a refill on your cardiac medications before your next appointment, please call your pharmacy.   Lab work: None  If you have labs (blood work) drawn today and your tests are completely normal, you will receive your results only by: Marland Kitchen MyChart Message (if you have MyChart) OR . A paper copy in the mail If you have any lab test that is abnormal or we need to change your treatment, we will call you to review the results.  Testing/Procedures: Your physician has requested that you have a lexiscan myoview. For further information please visit [email or web address]. Please follow instruction sheet, as given.    Follow-Up: At [facility], you and your health needs are our priority.  As part of our continuing mission to provide you with exceptional heart care, we have created designated Provider Care Teams.  These Care Teams include your primary Cardiologist (physician) and Advanced Practice Providers (APPs -  Physician Assistants and Nurse Practitioners) who all work together to provide you with the care you need, when you need it. You will need a follow up appointment in 6 months.  Please call our office 2 months in advance to schedule this appointment.  You may see Dr. Lovena Le  or one of the following Advanced Practice Providers on your designated Care Team:   Chanetta Marshall, NP . Tommye Standard, PA-C  Any Other Special Instructions Will Be Listed Below (If Applicable).

## 2018-05-03 NOTE — Progress Notes (Signed)
Cardiology Office Note:    Date:  05/03/2018   ID:  Destiny Hughes, DOB 1945/08/26, MRN [id number]  PCP:  Billie Ruddy, MD  Cardiologist:  Cristopher Peru, MD  Referring MD: Billie Ruddy, MD   Chief Complaint  Patient presents with  . Pre-op Exam    History of Present Illness:    Destiny Hughes is a 73 y.o. female with a past medical history significant for SVT/AFlutter s/p ablation 07/2016, COPD, chronic back pain, depression, GERD, HLD, HTN, hypothyroidism. She had a normal adenosine Cardiolite in 2007 and in 2011.  Normal Lexiscan Myoview in 08/2015. Echo in 08/2015 showed LVEF 60-65% with grade 2 diastolic dysfunction and a mildly dilated right ventricle that was also hypokinetic. Her IVC was noted to be dilated. She was started on Lasix 20 mg oral daily and her breathing has improved. She had carotid Dopplers 05/13/16 that revealed mild bilateral stenosis.   Destiny Hughes is here today for pre-op clearance. She had been cleared but then had an abnormal EKG at her pre-surgery workup. She is planned for Removal of spinal cord stimulator, thoracic paddle lead and generator.  Destiny Hughes is here today with her husband and son. She is coughing a lot as she get choked on her saliva. She has dysphagia for many years and told that she has scarring after neck surgery.   She is not very active. She mostly goes from chair to kitchen to bedroom. The only place she goes out is to doctor's visits. She has no energy. She has chronic knee and back/neck pain that limits her activity. She denies chest pain/pressure with activity. She has chronic shortness of breath related to COPD and is followed by pulmonology. She has occ edema, not bad today. She sleeps on the couch but she says it is because she falls asleep watching TV. Unable to discern if she has orthopnea.   Her family reports that she has short term memory loss and that is why they are here with her.   Past Medical History:    Diagnosis Date  . Arthritis    OA- knees & back  neck  . Chronic back pain   . Cognitive retention disorder   . Collapse of right lung   . COPD (chronic obstructive pulmonary disease) (HCC)    Hx. Bronchitis occ, 01-25-11 somes issue now taking  Z-pak-started 01-24-11/Scar tissue  present  from previous lung collapse  . Dementia with psychosis ([place])   . Depression   . Depression   . Dyspnea   . Dysrhythmia 2018   treated with Ablation   . GERD (gastroesophageal reflux disease)    tx. TUMS  . Glaucoma   . Headache   . History of kidney stones    surgically treated  . Hyperlipemia   . Hypertension   . Hypothyroidism    tx. Levothyroxine  . Major depression with psychotic features ([place])   . Nephrolithiasis   . Neuromuscular disorder ([place]) 01-25-11   Pain/nerve stimulator implanted -2 yrs ago.  Marland Kitchen Reflex, gag, absent   . Reflex, gag, absent     Past Surgical History:  Procedure Laterality Date  . A-FLUTTER ABLATION N/A 08/16/2016   Procedure: A-Flutter Ablation;  Surgeon: Evans Lance, MD;  Location: [facility];  Service: Cardiovascular;  Laterality: N/A;  . ANTERIOR CERVICAL DECOMP/DISCECTOMY FUSION     3 levels, per family  . APPENDECTOMY  01-25-11  . CARPAL TUNNEL RELEASE  01-25-11   Bil.  Marland Kitchen  CERVICAL SPINE SURGERY  01-25-11   2005-multiple levels  . CHOLECYSTECTOMY  01-25-11   '07  . COLONOSCOPY WITH PROPOFOL N/A 07/23/2012   Procedure: COLONOSCOPY WITH PROPOFOL;  Surgeon: Garlan Fair, MD;  Location: [facility];  Service: Endoscopy;  Laterality: N/A;  . detatched retna Bilateral    done July & August- 2019  . EYE SURGERY Bilateral 2019   repair of retina detachment- Dr. Zadie Rhine at surgical center   . JOINT REPLACEMENT  01-25-11   6'03 LTHA-hemi  . KNEE ARTHROPLASTY  01-25-11   '04-right, revised x2  . no gag reflex     Pt does not havea gag reflex following  siurgery to neck. Family unsure of date. Pt takes pill in apple sauce.  . SPINAL CORD STIMULATOR  BATTERY EXCHANGE N/A 01/12/2018   Procedure: Spinal cord stimulator battery replacement;  Surgeon: Clydell Hakim, MD;  Location: [facility];  Service: Neurosurgery;  Laterality: N/A;  left  . SPINAL CORD STIMULATOR IMPLANT  2009 APPROX   DR. ELSNER  . THORACIC SPINE SURGERY  01-25-11   6'02- then nerve stimulator implanted after  . TOTAL KNEE REVISION  02/01/2011   Procedure: TOTAL KNEE REVISION;  Surgeon: Mauri Pole;  Location: [facility];  Service: Orthopedics;  Laterality: Right;  . TUBAL LIGATION      Current Medications: Current Meds  Medication Sig  . acetaminophen (TYLENOL) 325 MG tablet Take 650 mg by mouth every 6 (six) hours as needed (pain.).  Marland Kitchen amLODipine (NORVASC) 5 MG tablet TAKE ONE TABLET DAILY (Patient taking differently: Take 5 mg by mouth daily. )  . buPROPion (WELLBUTRIN XL) 150 MG 24 hr tablet Take 1 tablet (150 mg total) by mouth every morning.  Marland Kitchen buPROPion (WELLBUTRIN XL) 150 MG 24 hr tablet 1  qam  For 1 week then 2  qam  . divalproex (DEPAKOTE ER) 500 MG 24 hr tablet 2  qam (Patient taking differently: Take 500 mg by mouth 2 (two) times daily. 2  qam)  . donepezil (ARICEPT) 10 MG tablet 1  qhs (Patient taking differently: Take 10 mg by mouth at bedtime. )  . furosemide (LASIX) 20 MG tablet TAKE ONE TABLET DAILY  . gabapentin (NEURONTIN) 100 MG capsule TAKE ONE CAPSULE TWICE A DAY (Patient taking differently: Take 100 mg by mouth 2 (two) times daily. )  . levalbuterol (XOPENEX) 0.63 MG/3ML nebulizer solution Take 3 mLs (0.63 mg total) by nebulization every 4 (four) hours as needed for wheezing or shortness of breath.  . levothyroxine (SYNTHROID, LEVOTHROID) 125 MCG tablet TAKE ONE TABLET DAILY BEFORE BREAKFAST (Patient taking differently: Take 125 mcg by mouth at bedtime. )  . lisinopril (PRINIVIL,ZESTRIL) 5 MG tablet TAKE ONE TABLET EACH DAY (Patient taking differently: Take 5 mg by mouth daily. )  . memantine (NAMENDA) 5 MG tablet 1  bid (Patient taking differently: Take  5 mg by mouth 2 (two) times daily. )  . pantoprazole (PROTONIX) 40 MG tablet TAKE ONE TABLET DAILY (Patient taking differently: Take 40 mg by mouth daily before breakfast. )  . propranolol (INDERAL) 80 MG tablet TAKE ONE-HALF TABLET 3 TIMES DAILY  . sodium chloride 1 g tablet TAKE ONE TABLET THREE TIMES DAILY (Patient taking differently: Take 1 g by mouth 3 (three) times daily. )   Current Facility-Administered Medications for the 05/03/18 encounter (Office Visit) with Daune Perch, NP  Medication  . umeclidinium-vilanterol (ANORO ELLIPTA) 62.5-25 MCG/INH 1 puff     Allergies:   Albuterol; Penicillins; Sulfa antibiotics;  Codeine; Ecotrin [aspirin]; and Zanaflex [tizanidine]   Social History   Socioeconomic History  . Marital status: Married    Spouse name: Not on file  . Number of children: 2  . Years of education: Not on file  . Highest education level: Not on file  Occupational History  . Occupation: disabled  Social Needs  . Financial resource strain: Not on file  . Food insecurity:    Worry: Not on file    Inability: Not on file  . Transportation needs:    Medical: Not on file    Non-medical: Not on file  Tobacco Use  . Smoking status: Former Smoker    Packs/day: 0.75    Years: 50.00    Pack years: 37.50    Types: Cigarettes    Start date: 09/13/1961    Last attempt to quit: 05/2016    Years since quitting: 1.9  . Smokeless tobacco: Never Used  . Tobacco comment: Decreased Down to 2/3 a Day if Thinking Abouit It  Substance and Sexual Activity  . Alcohol use: No    Alcohol/week: 0.0 standard drinks    Comment: none in 10 years  . Drug use: No  . Sexual activity: Never  Lifestyle  . Physical activity:    Days per week: Not on file    Minutes per session: Not on file  . Stress: Not on file  Relationships  . Social connections:    Talks on phone: Not on file    Gets together: Not on file    Attends religious service: Not on file    Active member of club or  organization: Not on file    Attends meetings of clubs or organizations: Not on file    Relationship status: Not on file  Other Topics Concern  . Not on file  Social History Narrative   She is from [facility]. She has always lived in [place]. She has traveled to [place], [place], [place], [place], & [place]. 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 Prior exposure to parakeets with last exposure remote in her current home. Previously had mold in her home that was in her bathroom & fixed. Prior exposure to hot tubs but none recently. Pt lives at home with [name], husband.  Has 2 childrean, 12th grade education.       Family History: The patient's family history includes Aneurysm in her mother; Asthma in her son; Breast cancer in her paternal aunt; Cancer in her sister; Dementia in her mother; Diabetes in her son; Emphysema in her father and mother; Heart attack in her father; Hypertension in her son and son; Rheum arthritis in her maternal aunt. ROS:   Please see the history of present illness.     All other systems reviewed and are negative.  EKGs/Labs/Other Studies Reviewed:    The following studies were reviewed today:  TEE 08/16/16 Study Conclusions  - Aortic valve: No evidence of vegetation. - Mitral valve: There was mild regurgitation. - Left atrium: No evidence of thrombus in the atrial cavity or   appendage. - Tricuspid valve: No evidence of vegetation.   EKG:  EKG is not ordered today.  The ekg from 04/19/18 is personally reviewed and  demonstrates Sinus bradycardia with 1st degree A-V block, 51 bpm, Left axis deviation,Cannot rule out Anterior infarct , age undetermined, ST & T wave abnormality with scooping ST segment  inferior and laterally,  -New since last tracing 11/21/16  Recent Labs: 08/16/2017: TSH 0.463 01/03/2018: ALT 8 02/14/2018: Pro B Natriuretic peptide (BNP) 166.0 04/19/2018: BUN  6; Creatinine, Ser 0.83; Hemoglobin 13.5; Platelets 329; Potassium 3.9; Sodium 125   Recent Lipid Panel    Component Value Date/Time   CHOL 195 04/28/2015 1525   TRIG 188 (H) 04/28/2015 1525   HDL 43 (L) 04/28/2015 1525   CHOLHDL 4.5 04/28/2015 1525   VLDL 38 (H) 04/28/2015 1525   LDLCALC 114 04/28/2015 1525    Physical Exam:    VS:  BP (!) 150/74   Pulse 64   Ht 5' 5.5" (1.664 m)   Wt 188 lb 6.4 oz (85.5 kg)   SpO2 92%   BMI 30.87 kg/m     Wt Readings from Last 3 Encounters:  05/03/18 188 lb 6.4 oz (85.5 kg)  04/26/18 188 lb (85.3 kg)  04/19/18 189 lb 6.4 oz (85.9 kg)     Physical Exam  Constitutional: She is oriented to person, place, and time. No distress.  Chronically ill appearing with stooped posture  HENT:  Head: Normocephalic and atraumatic.  Neck: No JVD present.  Slightly limited ROM due to prior neck surgery and neck pain  Cardiovascular: Normal rate, regular rhythm, normal heart sounds and intact distal pulses. Exam reveals no friction rub.  No murmur heard. Pulmonary/Chest: Breath sounds normal. No respiratory distress. She has no wheezes. She has no rales.  Abdominal: Soft. Bowel sounds are normal.  Musculoskeletal:        General: Edema present.     Comments: Trace ankle/pretibial edema  Neurological: She is alert and oriented to person, place, and time.  Slight resting tremor of left hand, not present with intention Forgetfulness requiring repeated directions.   Skin: Skin is warm and dry.  Psychiatric: She has a normal mood and affect. Her behavior is normal. Judgment and thought content normal.  Vitals reviewed.    ASSESSMENT:    1. Preop cardiovascular exam   2. Abnormal EKG   3. Essential hypertension   4. SVT (supraventricular tachycardia) ([place])   5. Hypothyroidism, unspecified type    PLAN:    In order of problems listed above:  Pre-surgical evaluation -Pt with abnormal EKG, ST changes inferolaterally. No chest discomfort but  chronic DOE related to COPD.  No obvious anginal symptoms however, pt is not very active at all, making it difficult to evaluate for exertional symptoms.  -He has no prior history of CAD or heart failure.  She has more at risk with surgical procedure related to her respiratory status than her cardiac status at this point. -Will check lexiscan myoveiw to evaluate for myocardial ischemia. If normal she can proceed with surgery.  If her study is high risk, cardiac catheterization would be need to be considered.  She is very much in hopes of having the hardware removed from her back as this is inhibiting her from being able to get an MRI of her head.   Hypertension -On amlodipine 5 mg, lisinopril 5 mg, lasix 20 mg daily. BP is not optimal, but was very well controlled at her last few office visits.  Continue current therapy and we will continue to monitor.   SVT/atrial flutter -S/P ablation 07/2016 per Dr. Lovena Le, no reoccurrences. Now off anticoagulation.  She still takes propranolol to control any fast heart rate.  Hypothyroidism -On synthroid per PCP  Medication Adjustments/Labs and Tests Ordered: Current medicines are reviewed at length with the patient  today.  Concerns regarding medicines are outlined above. Labs and tests ordered and medication changes are outlined in the patient instructions below:  Patient Instructions  Medication Instructions:  Your physician recommends that you continue on your current medications as directed. Please refer to the Current Medication list given to you today.  If you need a refill on your cardiac medications before your next appointment, please call your pharmacy.   Lab work: None  If you have labs (blood work) drawn today and your tests are completely normal, you will receive your results only by: Marland Kitchen MyChart Message (if you have MyChart) OR . A paper copy in the mail If you have any lab test that is abnormal or we need to change your treatment, we will  call you to review the results.  Testing/Procedures: Your physician has requested that you have a lexiscan myoview. For further information please visit [email or web address]. Please follow instruction sheet, as given.    Follow-Up: At [facility], you and your health needs are our priority.  As part of our continuing mission to provide you with exceptional heart care, we have created designated Provider Care Teams.  These Care Teams include your primary Cardiologist (physician) and Advanced Practice Providers (APPs -  Physician Assistants and Nurse Practitioners) who all work together to provide you with the care you need, when you need it. You will need a follow up appointment in 6 months.  Please call our office 2 months in advance to schedule this appointment.  You may see Dr. Lovena Le  or one of the following Advanced Practice Providers on your designated Care Team:   Chanetta Marshall, NP . Tommye Standard, PA-C  Any Other Special Instructions Will Be Listed Below (If Applicable).         Signed, Daune Perch, NP  05/03/2018 6:34 PM    [facility]

## 2018-05-04 ENCOUNTER — Telehealth (HOSPITAL_COMMUNITY): Payer: Self-pay | Admitting: *Deleted

## 2018-05-04 NOTE — Telephone Encounter (Signed)
Patient given detailed instructions per Myocardial Perfusion Study Information Sheet for the test on 05/07/18 at 10:30. Patient notified to arrive 15 minutes early and that it is imperative to arrive on time for appointment to keep from having the test rescheduled.  If you need to cancel or reschedule your appointment, please call the office within 24 hours of your appointment. . Patient verbalized understanding.Destiny Hughes

## 2018-05-07 ENCOUNTER — Ambulatory Visit (HOSPITAL_COMMUNITY): Payer: Medicare Other

## 2018-05-08 ENCOUNTER — Ambulatory Visit: Payer: Self-pay | Admitting: Emergency Medicine

## 2018-05-09 ENCOUNTER — Ambulatory Visit (HOSPITAL_COMMUNITY): Payer: Medicare Other | Attending: Internal Medicine

## 2018-05-09 DIAGNOSIS — I480 Paroxysmal atrial fibrillation: Secondary | ICD-10-CM | POA: Diagnosis not present

## 2018-05-09 DIAGNOSIS — R9431 Abnormal electrocardiogram [ECG] [EKG]: Secondary | ICD-10-CM | POA: Diagnosis not present

## 2018-05-09 LAB — MYOCARDIAL PERFUSION IMAGING
CHL CUP NUCLEAR SSS: 6
LV dias vol: 67 mL (ref 46–106)
LV sys vol: 20 mL
Peak HR: 76 {beats}/min
Rest HR: 56 {beats}/min
SDS: 2
SRS: 4
TID: 1

## 2018-05-09 MED ORDER — TECHNETIUM TC 99M TETROFOSMIN IV KIT
32.4000 | PACK | Freq: Once | INTRAVENOUS | Status: AC | PRN
  Administered 2018-05-09: 32.4 via INTRAVENOUS
  Filled 2018-05-09: qty 33

## 2018-05-09 MED ORDER — REGADENOSON 0.4 MG/5ML IV SOLN
0.4000 mg | Freq: Once | INTRAVENOUS | Status: AC
  Administered 2018-05-09: 0.4 mg via INTRAVENOUS

## 2018-05-09 MED ORDER — TECHNETIUM TC 99M TETROFOSMIN IV KIT
10.4000 | PACK | Freq: Once | INTRAVENOUS | Status: AC | PRN
  Administered 2018-05-09: 10.4 via INTRAVENOUS
  Filled 2018-05-09: qty 11

## 2018-05-11 NOTE — Telephone Encounter (Signed)
   Primary Cardiologist: Cristopher Peru, MD  Chart reviewed as part of pre-operative protocol coverage.  She had abnormal EKG at surgical pre-op assessment. She was seen by me in the office on 05/03/2018. She was having no significant anginal symptoms. She has chronic DOE related to COPD. She underwent nuclear stress test on 05/09/18 which showed EF 71% with no reversible ischemia.   Based on ACC/AHA guidelines, Destiny Hughes would be at acceptable risk for the planned procedure without further cardiovascular testing.   Patient may hold aspirin 7 days prior to her upcoming spinal cord stimulator surgery.   I will route this recommendation to the requesting party via Epic fax function and remove from pre-op pool.  Please call with questions.  Daune Perch, NP 05/11/2018, 12:27 PM

## 2018-05-11 NOTE — Telephone Encounter (Signed)
Pt now cleared after normal stress test. See prior surgical clearance request from 04/10/2018 for full clearance note.

## 2018-05-14 ENCOUNTER — Ambulatory Visit (INDEPENDENT_AMBULATORY_CARE_PROVIDER_SITE_OTHER): Payer: Medicare Other | Admitting: Family Medicine

## 2018-05-14 ENCOUNTER — Encounter: Payer: Self-pay | Admitting: Family Medicine

## 2018-05-14 VITALS — BP 98/60 | HR 74 | Temp 97.7°F | Wt 187.0 lb

## 2018-05-14 DIAGNOSIS — H9193 Unspecified hearing loss, bilateral: Secondary | ICD-10-CM | POA: Diagnosis not present

## 2018-05-14 DIAGNOSIS — J302 Other seasonal allergic rhinitis: Secondary | ICD-10-CM

## 2018-05-14 NOTE — Progress Notes (Signed)
Subjective:    Patient ID: Destiny Hughes, female    DOB: May 10, 1945, 73 y.o.   MRN: [id number]  No chief complaint on file. Pt accompanied by her husband and son.  HPI  Pt is a 73 yo female with pmh sig for dementia, COPD, HTN, MVP, Afib, chronic rhinitis, IBS, GERD, OA, HLD, glaucoma, chronic pain.  Patient was seen today for acute concern.  Per family pt with decreased hearing.  Son says pt has the tv volume on "40" in the house.  Pt also noted to having difficulty hearing when people speak to her.  Pt also with L ear pain and sinus pressure.  Duration unknown.  Pt using flonase and atrovent 0/03% nasal spray.  May help some per family.  In the past pt tried an allergy pill that did not help much.  Unsure which med that was.  Pt has a h/o chronic nasal drainage s/p tear duct surgery.  Pt has been following up with Pulm.  Given samples of Stiloto inhaler.  Insurance still won't cover the med.  Past Medical History:  Diagnosis Date  . Arthritis    OA- knees & back  neck  . Chronic back pain   . Cognitive retention disorder   . Collapse of right lung   . COPD (chronic obstructive pulmonary disease) (HCC)    Hx. Bronchitis occ, 01-25-11 somes issue now taking  Z-pak-started 01-24-11/Scar tissue  present  from previous lung collapse  . Dementia with psychosis ([place])   . Depression   . Depression   . Dyspnea   . Dysrhythmia 2018   treated with Ablation   . GERD (gastroesophageal reflux disease)    tx. TUMS  . Glaucoma   . Headache   . History of kidney stones    surgically treated  . Hyperlipemia   . Hypertension   . Hypothyroidism    tx. Levothyroxine  . Major depression with psychotic features ([place])   . Nephrolithiasis   . Neuromuscular disorder ([place]) 01-25-11   Pain/nerve stimulator implanted -2 yrs ago.  Marland Kitchen Reflex, gag, absent   . Reflex, gag, absent     Allergies  Allergen Reactions  . Albuterol Shortness Of Breath and Other (See Comments)  . Penicillins Anaphylaxis,  Swelling and Other (See Comments)    Has patient had a PCN reaction causing immediate rash, facial/tongue/throat swelling, SOB or lightheadedness with hypotension: Yes Has patient had a PCN reaction causing severe rash involving mucus membranes or skin necrosis: Rash Has patient had a PCN reaction that required hospitalization: No Has patient had a PCN reaction occurring within the last 10 years: No If all of the above answers are "NO", then may proceed with Cephalosporin use.  . Sulfa Antibiotics Swelling and Other (See Comments)    Throat swells, but no shortness of breath noted  . Codeine Nausea And Vomiting  . Ecotrin [Aspirin] Nausea And Vomiting  . Zanaflex [Tizanidine] Other (See Comments)    Possible confusion    ROS General: Denies fever, chills, night sweats, changes in weight, changes in appetite HEENT: Denies headaches, changes in vision, sore throat  +ear pain, decreased hearing, chronic rhinorrhea CV: Denies CP, palpitations, SOB, orthopnea Pulm: Denies SOB, cough, wheezing  +COPD GI: Denies abdominal pain, nausea, vomiting, diarrhea, constipation GU: Denies dysuria, hematuria, frequency, vaginal discharge Msk: Denies muscle cramps, joint pains Neuro: Denies weakness, numbness, tingling Skin: Denies rashes, bruising Psych: Denies depression, anxiety, hallucinations    Objective:    Blood pressure 98/60, pulse  74, temperature 97.7 F (36.5 C), temperature source Oral, weight 187 lb (84.8 kg), SpO2 94 %.  Gen. Pleasant, well-nourished, in no distress, normal affect   HEENT: [place]/AT, face symmetric, no scleral icterus, PERRLA, nares patent without drainage, pharynx with postnasal drainage, no erythema or exudate.  TMs normal b/l.  Hearing checked, L decreased > R. Lungs: no accessory muscle use, CTAB, no wheezes or rales Cardiovascular: RRR, no m/r/g, no peripheral edema Neuro:  A&Ox2 (person, place), CN II-XII intact, normal gait   Wt Readings from Last 3 Encounters:   05/14/18 187 lb (84.8 kg)  05/07/18 188 lb (85.3 kg)  05/03/18 188 lb 6.4 oz (85.5 kg)    Lab Results  Component Value Date   WBC 9.8 04/19/2018   HGB 13.5 04/19/2018   HCT 40.8 04/19/2018   PLT 329 04/19/2018   GLUCOSE 116 (H) 04/19/2018   CHOL 195 04/28/2015   TRIG 188 (H) 04/28/2015   HDL 43 (L) 04/28/2015   LDLCALC 114 04/28/2015   ALT 8 01/03/2018   AST 11 01/03/2018   NA 125 (L) 04/19/2018   K 3.9 04/19/2018   CL 85 (L) 04/19/2018   CREATININE 0.83 04/19/2018   BUN 6 (L) 04/19/2018   CO2 29 04/19/2018   TSH 0.463 08/16/2017   INR 1.07 08/16/2016   HGBA1C 6.4 (H) 04/28/2015   MICROALBUR 0.4 05/22/2014    Assessment/Plan:  Decreased hearing of both ears  -L ear failed hearing test.  R ear passed (missed one tone).  Results documented in flow sheet - Plan: Ambulatory referral to Audiology  Seasonal allergies  -discussed continuing flonase and atrovent nasal sparys -consider taking OTC allergy med such as Zyrtec, claritin, allegra, or xyzal.  F/u prn  Grier Mitts, MD

## 2018-05-14 NOTE — Patient Instructions (Signed)

## 2018-05-17 ENCOUNTER — Ambulatory Visit: Payer: Self-pay | Admitting: Pulmonary Disease

## 2018-05-18 ENCOUNTER — Other Ambulatory Visit: Payer: Self-pay | Admitting: Family Medicine

## 2018-05-18 ENCOUNTER — Telehealth: Payer: Self-pay

## 2018-05-18 DIAGNOSIS — R600 Localized edema: Secondary | ICD-10-CM

## 2018-05-18 NOTE — Telephone Encounter (Signed)
[name] husband sent a Mychart message asking what his wife was to do next after being cleared for surgery by [place]. I notified him that they should call the office doing the surgery for what to do next. I let him know that our office is only responsible for giving her cardiac clearance, Pt verbalized understanding and thanked me for the call.

## 2018-05-18 NOTE — Telephone Encounter (Signed)
OK for surgery.

## 2018-05-21 ENCOUNTER — Encounter: Payer: Self-pay | Admitting: Emergency Medicine

## 2018-05-21 ENCOUNTER — Ambulatory Visit (INDEPENDENT_AMBULATORY_CARE_PROVIDER_SITE_OTHER): Payer: Medicare Other | Admitting: Emergency Medicine

## 2018-05-21 ENCOUNTER — Other Ambulatory Visit: Payer: Self-pay | Admitting: Family Medicine

## 2018-05-21 VITALS — BP 132/82 | HR 67 | Ht 66.0 in | Wt 190.0 lb

## 2018-05-21 DIAGNOSIS — R05 Cough: Secondary | ICD-10-CM

## 2018-05-21 DIAGNOSIS — R053 Chronic cough: Secondary | ICD-10-CM | POA: Insufficient documentation

## 2018-05-21 DIAGNOSIS — J449 Chronic obstructive pulmonary disease, unspecified: Secondary | ICD-10-CM

## 2018-05-21 MED ORDER — UMECLIDINIUM-VILANTEROL 62.5-25 MCG/INH IN AEPB: 1.0000 | INHALATION_SPRAY | Freq: Every day | RESPIRATORY_TRACT | 5 refills | Status: DC

## 2018-05-21 NOTE — Assessment & Plan Note (Signed)
She has benefited from the Stiolto samples but it does not sound like she is going to be able to obtain these because they are not on her insurance formulary.  We can try Anoro but she has not tolerated powdered inhalers up till now.  If she fails the Anoro I may be able to do a prior authorization for the [employer].  I will asked her to get her insurance formulary to help clarify.  We will try Anoro 1 inhalation once daily to see if it helps your breathing and if you can tolerate it with regard to your cough.  If the medication helps you and if it is covered by your insurance then we will continue it.  Please get a copy of your insurance inhaler formulary for [place] to review at your next visit. You are at moderately increased risk for any kind of surgery but this does not preclude having the procedure done.  You can proceed if the benefits outweigh this risk. Follow with Dr. [name] in 2 months or sooner if you have any problems.

## 2018-05-21 NOTE — Progress Notes (Signed)
Subjective:    Patient ID: Destiny Hughes, female    DOB: 1946/03/17, 73 y.o.   MRN: [id number]  HPI 73 year old former smoker with a history of depression, dementia, GERD, hyperlipidemia, hypothyroidism, chronic pain with a implanted nerve stimulator, A Fib s/p cardioversion last year.  She is been followed by Dr. Ashok Cordia in our office for very severe COPD with emphysematous change.  FEV1 0.75 L (35% predicted) 08/27/2015. Last seen here 04/2016.   Since last visit she has had a lot of trouble with delirium, probable component dementia / depression, effects of narcotics (have been weaned significantly). Has had a lot of adjustments made w assistance of psychiatry.   She and family report decreased functional capacity, more dyspnea w exertion. She has gained some weight, some edema over the last several months. She has a daily non-prod cough. A lot of clear nasal gtt, on fexofenadine.  She's been tried on breo, [place], spiriva before - either cost or side effects were barriers.   [name] 02/05/18 --this is a follow-up visit for 73 year old woman with a history of severe obstructive lung disease.  She also has depression, dementia, GERD, hypothyroidism, chronic pain with an implanted nerve stimulator, atrial fibrillation.  At her last visit we initiated a trial of Stiolto, stop Atrovent nebs.  She returns today reporting that the Stiolto was beneficial but that it was not covered by insurance.  Because the patient has had difficulty with powdered medications before we tried Bevespi.  They report that the Bevespi does not help as much as the Stiolto. She remains dyspneic. She has daily cough, non-prod. Worse with talking. Lots of rhinitis - especially in the am. No longer on allegra. She never got her xopenex. No flares, no abx or pred. Flu shot up to date. PNA shots up to date.   Acute OV 04/26/18 --Destiny Hughes is 73 and has a history of severe COPD, atrial fibrillation, depression, GERD,  hypothyroidism, chronic rhinitis.  At her last visit we tried getting a prior authorization for Stiolto which has been the most helpful bronchodilator.  She is currently on bevespi. ? Whether she has an albuterol HFA right now.  For her chronic rhinitis we tried changing Allegra to loratadine, added Atrovent nasal spray - she never got it, ? why. Never got Stiolto so she is back on bevespi.  She is here describing a lot of nasal congestion  [name] 05/21/2018 --follow-up visit today for 73 year old woman with severe COPD, atrial fibrillation, depression, GERD, hypothyroidism, chronic rhinitis and cough.  She is here today for follow-up and for surgical clearance.  She is going to have her spinal cord stimulator removed.  We performed spirometry today that I reviewed.  This shows severe obstruction with an FEV1 of 0.9 L (39% predicted), this is stable and actually improved compared with her most recent pulmonary function testing that was done 08/27/2015.  Currently managed on Stiolto samples - she believes that it works well, helps her breathing. She is on zyrtec, atrovent NS, fluticasone NS. She is on lisinopril, also protonix.  Still having some cough although drainage may be a bit better. She is planning to see ENT.    Review of Systems      Objective:   Physical Exam Vitals:   05/21/18 1029  BP: 132/82  Pulse: 67  SpO2: 96%  Weight: 190 lb (86.2 kg)  Height: 5\' 6"  (1.676 m)   Gen: Pleasant, well-nourished, in no distress, in a wheelchair  ENT: No lesions,  mouth clear,  oropharynx clear, no postnasal drip  Neck: No JVD, no stridor  Lungs: No use of accessory muscles, decreased bilaterally, no wheezing, no crackles  Cardiovascular: RRR, heart sounds normal, no murmur or gallops, 1+ peripheral edema  Musculoskeletal: No deformities, no cyanosis or clubbing  Neuro: alert, forgetful, some pressured speech.  Moves all extremities, no apparent focal deficits  Skin: Warm, no lesions or rash      Assessment & Plan:  COPD (chronic obstructive pulmonary disease) ([place]) She has benefited from the Stiolto samples but it does not sound like she is going to be able to obtain these because they are not on her insurance formulary.  We can try Anoro but she has not tolerated powdered inhalers up till now.  If she fails the Anoro I may be able to do a prior authorization for the [employer].  I will asked her to get her insurance formulary to help clarify.  We will try Anoro 1 inhalation once daily to see if it helps your breathing and if you can tolerate it with regard to your cough.  If the medication helps you and if it is covered by your insurance then we will continue it.  Please get a copy of your insurance inhaler formulary for [place] to review at your next visit. You are at moderately increased risk for any kind of surgery but this does not preclude having the procedure done.  You can proceed if the benefits outweigh this risk. Follow with Dr. [name] in 2 months or sooner if you have any problems.   Chronic cough Please continue your nasal sprays as ordered, Zyrtec as ordered.  Agree with going to see ENT regarding your continued nasal drainage.  This is likely contributing to your cough. Please continue your Protonix.  Breakthrough reflux could also contribute to cough. I suspect that your lisinopril is also contributing to your cough.  Please discuss with Dr. Volanda Napoleon the possibility of changing this to an alternative medication such as valsartan.   Baltazar Apo, MD, PhD 05/21/2018, 5:10 PM [facility] [phone] or if no answer [phone]

## 2018-05-21 NOTE — Patient Instructions (Signed)
We will try Anoro 1 inhalation once daily to see if it helps your breathing and if you can tolerate it with regard to your cough.  If the medication helps you and if it is covered by your insurance then we will continue it.  Please get a copy of your insurance inhaler formulary for [place] to review at your next visit. You are at moderately increased risk for any kind of surgery but this does not preclude having the procedure done.  You can proceed if the benefits outweigh this risk. Please continue your nasal sprays as ordered, Zyrtec as ordered.  Agree with going to see ENT regarding your continued nasal drainage.  This is likely contributing to your cough. Please continue your Protonix.  Breakthrough reflux could also contribute to cough. I suspect that your lisinopril is also contributing to your cough.  Please discuss with Dr. Volanda Napoleon the possibility of changing this to an alternative medication such as valsartan. Follow with Dr. [name] in 2 months or sooner if you have any problems.

## 2018-05-21 NOTE — Assessment & Plan Note (Signed)
Please continue your nasal sprays as ordered, Zyrtec as ordered.  Agree with going to see ENT regarding your continued nasal drainage.  This is likely contributing to your cough. Please continue your Protonix.  Breakthrough reflux could also contribute to cough. I suspect that your lisinopril is also contributing to your cough.  Please discuss with Dr. Volanda Napoleon the possibility of changing this to an alternative medication such as valsartan.

## 2018-05-25 ENCOUNTER — Telehealth: Payer: Self-pay | Admitting: *Deleted

## 2018-05-25 ENCOUNTER — Other Ambulatory Visit: Payer: Self-pay

## 2018-05-25 DIAGNOSIS — R600 Localized edema: Secondary | ICD-10-CM

## 2018-05-25 MED ORDER — FUROSEMIDE 20 MG PO TABS: 20.0000 mg | ORAL_TABLET | Freq: Every day | ORAL | 0 refills | Status: DC

## 2018-05-25 NOTE — Telephone Encounter (Signed)
Spoke with pt husband states that pt has been scheduled for an appointment with [facility]

## 2018-05-25 NOTE — Telephone Encounter (Signed)
Copied from [place] [id number]. Topic: Referral - Status >> May 25, 2018 11:31 AM Scherrie Gerlach wrote: Reason for CRM: husband calling to follow up on referral to [facility].  The referral states closed, all visits scheduled, but pt has not heard anything from that office. I called their office and left message on their VM.

## 2018-05-29 ENCOUNTER — Other Ambulatory Visit: Payer: Self-pay | Admitting: Neurological Surgery

## 2018-05-30 NOTE — Pre-Procedure Instructions (Addendum)
Destiny Hughes  05/30/2018      [facility] - [place], [place] - [address] [place] [address] Phone: [phone] Fax: [phone]    Your procedure is scheduled on Monday 06/04/18  Report to [facility], Entrance A at 1000 A.M.  Call this number if you have problems the morning of surgery:  [phone]   Remember:  Do not eat or drink after midnight.  Take these medicines the morning of surgery with A SIP OF WATER :  Divalproex (DEPAKOTE ER) ,Fluticasone (FLONASE), Gabapentin (Neurontin), Ipratropium,(ATROVENT), Memantine (NAMENDA), Pantoprazole (PROTONIX), Propranolol (INDERAL), and Umeclidinium-vilanterol (ANORO ELLIPTA)- Bring your [facility] ELLIPTA) with you on day of Surgery.  As needed: Acetaminophen (TYLENOL).   As of today ,STOP taking any Aspirin (unless otherwise instructed by your surgeon), Aleve, Naproxen, Ibuprofen, Motrin, Advil, Goody's, BC's, all herbal medications, fish oil, and all vitamins.    Do not wear jewelry, make-up or nail polish.  Do not wear lotions, powders, or perfumes, or deodorant.  Do not shave 48 hours prior to surgery.  Men may shave face and neck.  Do not bring valuables to the hospital.  [facility] is not responsible for any belongings or valuables.  Contacts, dentures or bridgework may not be worn into surgery.  Leave your suitcase in the car.  After surgery it may be brought to your room.  For patients admitted to the hospital, discharge time will be determined by your treatment team.  Patients discharged the day of surgery will not be allowed to drive home.   Special instructions: [facility]- Preparing For Surgery  Before surgery, you can play an important role. Because skin is not sterile, your skin needs to be as free of germs as possible. You can reduce the number of germs on your skin by washing with CHG (chlorahexidine gluconate) Soap before surgery.  CHG is an antiseptic cleaner which  kills germs and bonds with the skin to continue killing germs even after washing.    Oral Hygiene is also important to reduce your risk of infection.  Remember - BRUSH YOUR TEETH THE MORNING OF SURGERY WITH YOUR REGULAR TOOTHPASTE  Please do not use if you have an allergy to CHG or antibacterial soaps. If your skin becomes reddened/irritated stop using the CHG.  Do not shave (including legs and underarms) for at least 48 hours prior to first CHG shower. It is OK to shave your face.  Please follow these instructions carefully.   1. Shower the NIGHT BEFORE SURGERY and the MORNING OF SURGERY with CHG.   2. If you chose to wash your hair, wash your hair first as usual with your normal shampoo.  3. After you shampoo, rinse your hair and body thoroughly to remove the shampoo.  4. Use CHG as you would any other liquid soap. You can apply CHG directly to the skin and wash gently with a scrungie or a clean washcloth.   5. Apply the CHG Soap to your body ONLY FROM THE NECK DOWN.  Do not use on open wounds or open sores. Avoid contact with your eyes, ears, mouth and genitals (private parts). Wash Face and genitals (private parts)  with your normal soap.  6. Wash thoroughly, paying special attention to the area where your surgery will be performed.  7. Thoroughly rinse your body with warm water from the neck down.  8. DO NOT shower/wash with your normal soap after using and rinsing off the CHG  Soap.  9. Pat yourself dry with a CLEAN TOWEL.  10. Wear CLEAN PAJAMAS to bed the night before surgery, wear comfortable clothes the morning of surgery  11. Place CLEAN SHEETS on your bed the night of your first shower and DO NOT SLEEP WITH PETS.  Day of Surgery:  Do not apply any deodorants/lotions.  Please wear clean clothes to the hospital/surgery center.   Remember to brush your teeth WITH YOUR REGULAR TOOTHPASTE.  Please read over the following fact sheets that you were given. Pain Booklet,  Coughing and Deep Breathing and Surgical Site Infection Prevention

## 2018-05-31 ENCOUNTER — Encounter (HOSPITAL_COMMUNITY)
Admission: RE | Admit: 2018-05-31 | Discharge: 2018-05-31 | Disposition: A | Discharge status: Home / Self Care | Payer: Medicare Other | Source: Ambulatory Visit | Attending: Neurological Surgery | Admitting: Neurological Surgery

## 2018-05-31 ENCOUNTER — Encounter (HOSPITAL_COMMUNITY): Payer: Self-pay

## 2018-05-31 ENCOUNTER — Other Ambulatory Visit: Payer: Self-pay

## 2018-05-31 DIAGNOSIS — Z01812 Encounter for preprocedural laboratory examination: Secondary | ICD-10-CM | POA: Insufficient documentation

## 2018-05-31 LAB — CBC
HCT: 42.3 % (ref 36.0–46.0)
Hemoglobin: 13.6 g/dL (ref 12.0–15.0)
MCH: 27.4 pg (ref 26.0–34.0)
MCHC: 32.2 g/dL (ref 30.0–36.0)
MCV: 85.3 fL (ref 80.0–100.0)
Platelets: 309 10*3/uL (ref 150–400)
RBC: 4.96 MIL/uL (ref 3.87–5.11)
RDW: 15.4 % (ref 11.5–15.5)
WBC: 6.5 10*3/uL (ref 4.0–10.5)
nRBC: 0 % (ref 0.0–0.2)

## 2018-05-31 LAB — BASIC METABOLIC PANEL
Anion gap: 10 (ref 5–15)
BUN: 17 mg/dL (ref 8–23)
CO2: 26 mmol/L (ref 22–32)
Calcium: 9.1 mg/dL (ref 8.9–10.3)
Chloride: 93 mmol/L — ABNORMAL LOW (ref 98–111)
Creatinine, Ser: 1.07 mg/dL — ABNORMAL HIGH (ref 0.44–1.00)
GFR calc Af Amer: 59 mL/min — ABNORMAL LOW (ref >60)
GFR calc non Af Amer: 51 mL/min — ABNORMAL LOW (ref >60)
GLUCOSE: 116 mg/dL — AB (ref 70–99)
Potassium: 5.3 mmol/L — ABNORMAL HIGH (ref 3.5–5.1)
Sodium: 129 mmol/L — ABNORMAL LOW (ref 135–145)

## 2018-05-31 LAB — SURGICAL PCR SCREEN
MRSA, PCR: NEGATIVE
Staphylococcus aureus: NEGATIVE

## 2018-05-31 NOTE — Progress Notes (Signed)
PCP - Volanda Napoleon  Cardiologist - Taylor  Chest x-ray - 02/14/18 (E)  EKG - 04/19/18 (E)  Stress Test - 05/09/18 (E)  ECHO - 08/16/16 (E)  Cardiac Cath - Denies  AICD- N/A PM- N/A LOOP-N/A  Sleep Study - Denies CPAP -   LABS- CBC, BMP, PCR  05/31/18  Anesthesia- Y (Cardiac history)   Pt denies having chest pain, sob, or fever at this time. All instructions explained to the pt, with a verbal understanding of the material. Pt agrees to go over the instructions while at home for a better understanding. The opportunity to ask questions was provided.

## 2018-05-31 NOTE — Progress Notes (Signed)
Message left for Jessica from Dr. Clarice Pole office regarding abnormal BMP results.

## 2018-06-01 ENCOUNTER — Other Ambulatory Visit: Payer: Self-pay | Admitting: Neurological Surgery

## 2018-06-01 ENCOUNTER — Other Ambulatory Visit: Payer: Self-pay | Admitting: Family Medicine

## 2018-06-01 NOTE — Progress Notes (Signed)
Anesthesia Chart Review:  Case:  [id number] Date/Time:  06/04/18 1145   Procedure:  Removal of spinal cord stimulator, thoracic paddle and generator (N/A ) - Removal of spinal cord stimulator, thoracic paddle and generator   Anesthesia type:  General   Pre-op diagnosis:  Post thoracotomy pain   Location:  [facility] OR ROOM 20 / [facility]   Surgeon:  Kristeen Miss, MD      DISCUSSION: Patient is a 73 year old female scheduled for the above procedure. Surgery was initially scheduled for 04/27/18, but was postponed due hyponatremia and need for additional cardiology and pulmonology input. (See my previous note from encounter 04/19/18 for additional details.)She is s/p removal of[employer]ien[employer] PrecisionSCS generator, implant [place] MRI compatible SCS generator on 01/12/18.   History includes COPD (severe), former smoker (quit 05/19/16), HTN, afib/flutter (had been on b-blocker and digoxin for palpitations, but discontinued ~ 07/01/16 for bradycardia, readmit 07/11/16 with SVT/aflutter; s/p ablation 08/16/16), HLD, hypothyroidism, glaucoma, GERD, dementia/cognitive retention disorder, major depression with psychotic features, abnormal ("absent" gag reflex), chronic pain syndrome, spinal cord stimulator '09, hyponatremia (on NaCl tablets).   - She was seen for preoperative cardiology evaluation due to EKG changes. A Lexiscan Myoview was ordered and done on 05/09/18. Results were normal, so per Daune Perch, NP, "She can proceed with surgery." Cardiology had previously given permission to hold ASA 7 days prior to surgery.   - She was seen by pulmonologist Dr. [name] on 05/21/18. He wrote, "You are at moderately increased risk for any kind of surgery but this does not preclude having the procedure done.  You can proceed if the benefits outweigh this risk."   Patient's preoperative BMET showed a stable, improved Na up to 129. K was 5.3, but slight hemolysis. Per surgeon, plan for STAT BMET on arrival.  Patient has known chronic hyponatremia, so would anticipate that it will remain low. If follow-up labs acceptable and no acute symptomatic changes then I would anticipate that she can proceed as planned.   VS: BP 134/64   Pulse (!) 55   Temp 36.7 C   Resp 18   Ht 5\' 6"  (1.676 m)   Wt 86.2 kg   SpO2 95%   BMI 30.67 kg/m    PROVIDERS: - Billie Ruddy, MD is PCP. Last visit 02/14/18.  Baltazar Apo, MD is pulmonologist. Last visit 05/21/18. Stiolto had proven beneficial to patient, but unfortunately not covered by her insusrance. He is trying her on Anoro, and if fails therapy he will try prior authorization for the Stiolto. There is mention of referral to ENT regarding continued nasal drainage. - Cristopher Peru, MD is EP cardiologist. Last visit with Daune Perch, NP on 05/03/18. (Previously had been seeing Skeet Latch, MD for primary cardiology, but has primarily been seeing EP since ablation 2018.) She had no recurrent SVT. Her [place] were previously discontinued per Dr. Lovena Le.  Leta Baptist, Bonnita Levan, MD is neurologist. Last visit 08/22/16.  Norma Fredrickson, MD is psychiatrist. Last Progress Note dated 04/06/18.   LABS: Preoperative labs noted. Na 129, which is good for her. K 5.3, but with slight hemolysis. CBC WNL. Na, K results called to surgeon. He requests STAT BMET on the day of surgery.  (all labs ordered are listed, but only abnormal results are displayed)  Labs Reviewed  BASIC METABOLIC PANEL - Abnormal; Notable for the following components:      Result Value   Sodium 129 (*)    Potassium 5.3 (*)  Chloride 93 (*)    Glucose, Bld 116 (*)    Creatinine, Ser 1.07 (*)    GFR calc non Af Amer 51 (*)    GFR calc Af Amer 59 (*)    All other components within normal limits  SURGICAL PCR SCREEN  CBC   Sodium trends: Sodium  Date Value  05/31/2018 129 mmol/L (L)  04/19/2018 125 mmol/L (L)  02/14/2018 129 mEq/L (L)  01/12/2018 124 mmol/L (L)  01/10/2018 126  mmol/L (L)  01/10/2018 124 mmol/L (L)  01/03/2018 126 mmol/L (L)  08/16/2017 131 mmol/L (L)  12/23/2016 136 (A)  09/05/2016 139 mmol/L  07/13/2016 129 (A)  06/02/2016 129 (A)    PFT/Spirometry: - 05/21/18: FVC 1.6 L (51%)  FEV1 0.9 L (39%)  FEV1/FVC 58% (78%)  FEF25-75% 0.5 L (25%). Severe airway obstruction. -08/27/15: FVC 1.09 L (34%) FEV1 0.75 L (31%) FEV1/FVC 0.69 FEF 25-75 0.49 L (25%). DLCO uncorrected 42%.  -12/26/14: FVC 1.23 L (38%) FEV1 0.78 L (32%) FEV1/FVC 0.64 FEF 25-75 0.43 L (21%) TLC 4.96 L (92%) RV 128% ERV 27% DLCO uncorrected 51%   IMAGES: CXR 02/14/18: IMPRESSION: Hyperinflated lungs. Scarring at the lung bases.   EKG: - EKG 04/19/18: SB at 51 bpm with first degree AV block. LAD. Cannot rule out anterior infarct (age undetermined). ST/T wave abnormality, consider lateral ischemia. New since 11/21/16 tracing and new in V4-5 and more pronounced in V6 when compared to 04/18/17 tracing.  - EKG 04/18/17: SR with marked sinus arrhythmia with first degree AV block. Anterior infarct (age undetermined).   CV: Nuclear stress test 05/09/18:  Nuclear stress EF: 71%.  No T wave inversion was noted during stress.  There was no ST segment deviation noted during stress.  The study is normal. No reversible ischemia. LVEF 71% with normal wall motion. This is a low risk study.  TTE 11/05/16 ([place]): Summary: The left ventricle is normal in size. There is no thrombus. There is normal left ventricular wall thickness. Left ventricular systolic function is normal. Ejection Fraction = >55%. No regional wall motion abnormalitiesnoted.  The left atrial size is normal. Right atrial size is normal. The interatrial septum is intact with no evidence for an atrial septal defect. Trace mitral regurgitation. Mild tricuspid regurgitation.Right ventricular systolic pressure is elevated at 30- 57mmHg. (Comparison TTE 09/01/15:LVEF 60-65%, normal wall thickness,  normal wall motion, stage 2 DD with elevated LV filling pressure, mild MR, normal LA size,mild RAE, dilated RV with reduced systolic function, dilated IVC,no TR.)  TEE 08/16/16: Study Conclusions - Aortic valve: No evidence of vegetation. - Mitral valve: There was mild regurgitation. - Left atrium: No evidence of thrombus in the atrial cavity or appendage. - Tricuspid valve: No evidence of vegetation.  EPstudy/radiofrequency catheter ablation 08/16/16: CONCLUSIONS:  1. Isthmus-dependent right atrial flutter upon presentation.  2. Successful radiofrequency ablation of atrial flutter along the cavotricuspid isthmus with complete bidirectional isthmus block achieved.  3. Successful catheter ablation of inducible AVNRT 4. No inducible arrhythmias following ablation.  5. The patient had transient AV block after the ablation which resolved and did not return. 21 day Event monitor 07/2016: Atrial flutter and atrial fibrillation noted PVCs  Carotid U/S 05/13/16: Impressions: Heterogeneous plaque, bilaterally. Stable 1-39% bilateral ICA stenosis. Normal subclavian arteries, bilaterally. Patent vertebral arteries with antegrade flow.   Past Medical History:  Diagnosis Date  . Arthritis    OA- knees & back  neck  . Chronic back pain   . Cognitive retention disorder   .  Collapse of right lung   . COPD (chronic obstructive pulmonary disease) (HCC)    Hx. Bronchitis occ, 01-25-11 somes issue now taking  Z-pak-started 01-24-11/Scar tissue  present  from previous lung collapse  . Dementia with psychosis ([place])   . Depression   . Depression   . Dyspnea   . Dysrhythmia 2018   treated with Ablation   . GERD (gastroesophageal reflux disease)    tx. TUMS  . Glaucoma   . Headache   . History of kidney stones    surgically treated  . Hyperlipemia   . Hypertension   . Hypothyroidism    tx. Levothyroxine  . Major depression with psychotic features ([place])   . Nephrolithiasis   .  Neuromuscular disorder ([place]) 01-25-11   Pain/nerve stimulator implanted -2 yrs ago.  Marland Kitchen Reflex, gag, absent   . Reflex, gag, absent     Past Surgical History:  Procedure Laterality Date  . A-FLUTTER ABLATION N/A 08/16/2016   Procedure: A-Flutter Ablation;  Surgeon: Evans Lance, MD;  Location: [facility];  Service: Cardiovascular;  Laterality: N/A;  . ANTERIOR CERVICAL DECOMP/DISCECTOMY FUSION     3 levels, per family  . APPENDECTOMY  01-25-11  . CARPAL TUNNEL RELEASE  01-25-11   [place].  . CERVICAL SPINE SURGERY  01-25-11   2005-multiple levels  . CHOLECYSTECTOMY  01-25-11   '07  . COLONOSCOPY WITH PROPOFOL N/A 07/23/2012   Procedure: COLONOSCOPY WITH PROPOFOL;  Surgeon: Garlan Fair, MD;  Location: [facility];  Service: Endoscopy;  Laterality: N/A;  . detatched retna Bilateral    done July & August- 2019  . EYE SURGERY Bilateral 2019   repair of retina detachment- Dr. Zadie Rhine at surgical center   . JOINT REPLACEMENT  01-25-11   6'03 LTHA-hemi  . KNEE ARTHROPLASTY  01-25-11   '04-right, revised x2  . no gag reflex     Pt does not havea gag reflex following  siurgery to neck. Family unsure of date. Pt takes pill in apple sauce.  . SPINAL CORD STIMULATOR BATTERY EXCHANGE N/A 01/12/2018   Procedure: Spinal cord stimulator battery replacement;  Surgeon: Clydell Hakim, MD;  Location: [facility];  Service: Neurosurgery;  Laterality: N/A;  left  . SPINAL CORD STIMULATOR IMPLANT  2009 APPROX   DR. ELSNER  . THORACIC SPINE SURGERY  01-25-11   6'02- then nerve stimulator implanted after  . TOTAL KNEE REVISION  02/01/2011   Procedure: TOTAL KNEE REVISION;  Surgeon: Mauri Pole;  Location: [facility];  Service: Orthopedics;  Laterality: Right;  . TUBAL LIGATION      MEDICATIONS: . acetaminophen (TYLENOL) 325 MG tablet  . amLODipine (NORVASC) 5 MG tablet  . buPROPion (WELLBUTRIN XL) 150 MG 24 hr tablet  . divalproex (DEPAKOTE ER) 500 MG 24 hr tablet  . donepezil (ARICEPT) 10 MG tablet   . fluticasone (FLONASE) 50 MCG/ACT nasal spray  . furosemide (LASIX) 20 MG tablet  . gabapentin (NEURONTIN) 100 MG capsule  . ipratropium (ATROVENT) 0.03 % nasal spray  . levalbuterol (XOPENEX) 0.63 MG/3ML nebulizer solution  . levothyroxine (SYNTHROID, LEVOTHROID) 125 MCG tablet  . lisinopril (PRINIVIL,ZESTRIL) 10 MG tablet  . lisinopril (PRINIVIL,ZESTRIL) 5 MG tablet  . memantine (NAMENDA) 5 MG tablet  . pantoprazole (PROTONIX) 40 MG tablet  . propranolol (INDERAL) 80 MG tablet  . sodium chloride 1 g tablet  . temazepam (RESTORIL) 30 MG capsule  . umeclidinium-vilanterol (ANORO ELLIPTA) 62.5-25 MCG/INH AEPB   . umeclidinium-vilanterol (ANORO ELLIPTA) 62.5-25 MCG/INH  1 puff  Amlodipine and Wellbutrin are documented as patient not taking. PAT RN instructed to hold ASA until after surgery unless otherwise instructed by surgeon.   Myra Gianotti, PA-C Surgical Short Stay/Anesthesiology [facility] Phone [phone] [facility] Phone [phone] 06/01/2018 11:03 AM

## 2018-06-01 NOTE — Anesthesia Preprocedure Evaluation (Addendum)
Anesthesia Evaluation  Patient identified by MRN, date of birth, ID band Patient awake    Reviewed: Allergy & Precautions, NPO status , Patient's Chart, lab work & pertinent test results  Airway Mallampati: II  TM Distance: >3 FB Neck ROM: Full    Dental  (+) Teeth Intact, Dental Advisory Given   Pulmonary COPD, former smoker,    breath sounds clear to auscultation       Cardiovascular hypertension, Pt. on medications +CHF  + dysrhythmias  Rhythm:Regular Rate:Normal     Neuro/Psych  Headaches, Depression Schizophrenia Dementia    GI/Hepatic GERD  Medicated,  Endo/Other  Hypothyroidism   Renal/GU      Musculoskeletal  (+) Arthritis ,   Abdominal Normal abdominal exam  (+)   Peds  Hematology   Anesthesia Other Findings   Reproductive/Obstetrics                           Lab Results  Component Value Date   WBC 6.5 05/31/2018   HGB 13.6 05/31/2018   HCT 42.3 05/31/2018   MCV 85.3 05/31/2018   PLT 309 05/31/2018   Lab Results  Component Value Date   CREATININE 1.07 (H) 05/31/2018   BUN 17 05/31/2018   NA 129 (L) 05/31/2018   K 5.3 (H) 05/31/2018   CL 93 (L) 05/31/2018   CO2 26 05/31/2018   Lab Results  Component Value Date   INR 1.07 08/16/2016   INR 1.04 07/01/2016   INR 0.99 01/25/2011   EKG: sinus bradycardia, 1st degree AV block.   Nuclear stress EF: 71%.  No T wave inversion was noted during stress.  There was no ST segment deviation noted during stress.  The study is normal.  Anesthesia Physical Anesthesia Plan  ASA: III  Anesthesia Plan: General   Post-op Pain Management:    Induction: Intravenous  PONV Risk Score and Plan: 4 or greater and Ondansetron, Dexamethasone and Treatment may vary due to age or medical condition  Airway Management Planned: Oral ETT  Additional Equipment: None  Intra-op Plan:   Post-operative Plan: Extubation in  OR  Informed Consent: I have reviewed the patients History and Physical, chart, labs and discussed the procedure including the risks, benefits and alternatives for the proposed anesthesia with the patient or authorized representative who has indicated his/her understanding and acceptance.     Dental advisory given  Plan Discussed with: CRNA  Anesthesia Plan Comments: (PAT note written 06/01/2018 by Myra Gianotti, PA-C. Has chronic hyponatremia on NaCl tablets.   Albuterol intraop. )     Anesthesia Quick Evaluation

## 2018-06-04 ENCOUNTER — Ambulatory Visit (HOSPITAL_COMMUNITY)
Admission: RE | Admit: 2018-06-04 | Discharge: 2018-06-04 | Disposition: A | Discharge status: Home / Self Care | Payer: Medicare Other | Attending: Neurological Surgery | Admitting: Neurological Surgery

## 2018-06-04 ENCOUNTER — Encounter (HOSPITAL_COMMUNITY): Payer: Self-pay

## 2018-06-04 ENCOUNTER — Ambulatory Visit (HOSPITAL_COMMUNITY): Payer: Medicare Other | Admitting: Vascular Surgery

## 2018-06-04 ENCOUNTER — Ambulatory Visit (HOSPITAL_COMMUNITY): Payer: Medicare Other | Admitting: Anesthesiology

## 2018-06-04 ENCOUNTER — Encounter (HOSPITAL_COMMUNITY)
Admission: RE | Disposition: A | Discharge status: Home / Self Care | Payer: Self-pay | Source: Home / Self Care | Attending: Neurological Surgery

## 2018-06-04 ENCOUNTER — Other Ambulatory Visit: Payer: Self-pay

## 2018-06-04 DIAGNOSIS — Z79890 Hormone replacement therapy (postmenopausal): Secondary | ICD-10-CM | POA: Diagnosis not present

## 2018-06-04 DIAGNOSIS — Y831 Surgical operation with implant of artificial internal device as the cause of abnormal reaction of the patient, or of later complication, without mention of misadventure at the time of the procedure: Secondary | ICD-10-CM | POA: Insufficient documentation

## 2018-06-04 DIAGNOSIS — F0390 Unspecified dementia without behavioral disturbance: Secondary | ICD-10-CM | POA: Diagnosis not present

## 2018-06-04 DIAGNOSIS — T85192A Other mechanical complication of implanted electronic neurostimulator (electrode) of spinal cord, initial encounter: Secondary | ICD-10-CM | POA: Diagnosis present

## 2018-06-04 DIAGNOSIS — Z96651 Presence of right artificial knee joint: Secondary | ICD-10-CM | POA: Insufficient documentation

## 2018-06-04 DIAGNOSIS — G8912 Acute post-thoracotomy pain: Secondary | ICD-10-CM | POA: Diagnosis not present

## 2018-06-04 DIAGNOSIS — J449 Chronic obstructive pulmonary disease, unspecified: Secondary | ICD-10-CM | POA: Diagnosis not present

## 2018-06-04 DIAGNOSIS — K219 Gastro-esophageal reflux disease without esophagitis: Secondary | ICD-10-CM | POA: Diagnosis not present

## 2018-06-04 DIAGNOSIS — I110 Hypertensive heart disease with heart failure: Secondary | ICD-10-CM | POA: Insufficient documentation

## 2018-06-04 DIAGNOSIS — F209 Schizophrenia, unspecified: Secondary | ICD-10-CM | POA: Insufficient documentation

## 2018-06-04 DIAGNOSIS — F329 Major depressive disorder, single episode, unspecified: Secondary | ICD-10-CM | POA: Diagnosis not present

## 2018-06-04 DIAGNOSIS — T85112A Breakdown (mechanical) of implanted electronic neurostimulator (electrode) of spinal cord, initial encounter: Secondary | ICD-10-CM | POA: Diagnosis not present

## 2018-06-04 DIAGNOSIS — Z87891 Personal history of nicotine dependence: Secondary | ICD-10-CM | POA: Insufficient documentation

## 2018-06-04 DIAGNOSIS — I509 Heart failure, unspecified: Secondary | ICD-10-CM | POA: Diagnosis not present

## 2018-06-04 DIAGNOSIS — E039 Hypothyroidism, unspecified: Secondary | ICD-10-CM | POA: Diagnosis not present

## 2018-06-04 DIAGNOSIS — G8922 Chronic post-thoracotomy pain: Secondary | ICD-10-CM | POA: Insufficient documentation

## 2018-06-04 HISTORY — PX: SPINAL CORD STIMULATOR REMOVAL: SHX5379

## 2018-06-04 LAB — BASIC METABOLIC PANEL
Anion gap: 8 (ref 5–15)
BUN: 13 mg/dL (ref 8–23)
CO2: 27 mmol/L (ref 22–32)
CREATININE: 0.92 mg/dL (ref 0.44–1.00)
Calcium: 8.9 mg/dL (ref 8.9–10.3)
Chloride: 93 mmol/L — ABNORMAL LOW (ref 98–111)
GFR calc Af Amer: >60 mL/min (ref >60)
GFR calc non Af Amer: >60 mL/min (ref >60)
Glucose, Bld: 120 mg/dL — ABNORMAL HIGH (ref 70–99)
Potassium: 4.2 mmol/L (ref 3.5–5.1)
Sodium: 128 mmol/L — ABNORMAL LOW (ref 135–145)

## 2018-06-04 SURGERY — LUMBAR SPINAL CORD STIMULATOR REMOVAL
Anesthesia: General | Site: Back

## 2018-06-04 MED ORDER — ALUM & MAG HYDROXIDE-SIMETH 200-200-20 MG/5ML PO SUSP: 30.0000 mL | Freq: Four times a day (QID) | ORAL | Status: DC | PRN

## 2018-06-04 MED ORDER — SODIUM CHLORIDE 0.9 % IV SOLN: 250.0000 mL | INTRAVENOUS | Status: DC

## 2018-06-04 MED ORDER — ACETAMINOPHEN 325 MG PO TABS: 650.0000 mg | ORAL_TABLET | Freq: Four times a day (QID) | ORAL | Status: DC | PRN

## 2018-06-04 MED ORDER — ONDANSETRON HCL 4 MG/2ML IJ SOLN
INTRAMUSCULAR | Status: DC | PRN
  Administered 2018-06-04: 4 mg via INTRAVENOUS

## 2018-06-04 MED ORDER — SENNA 8.6 MG PO TABS: 1.0000 | ORAL_TABLET | Freq: Two times a day (BID) | ORAL | Status: DC

## 2018-06-04 MED ORDER — UMECLIDINIUM-VILANTEROL 62.5-25 MCG/INH IN AEPB
1.0000 | INHALATION_SPRAY | Freq: Every day | RESPIRATORY_TRACT | Status: DC
  Filled 2018-06-04: qty 14

## 2018-06-04 MED ORDER — ROCURONIUM BROMIDE 10 MG/ML (PF) SYRINGE
PREFILLED_SYRINGE | INTRAVENOUS | Status: DC | PRN
  Administered 2018-06-04: 50 mg via INTRAVENOUS

## 2018-06-04 MED ORDER — FENTANYL CITRATE (PF) 250 MCG/5ML IJ SOLN
INTRAMUSCULAR | Status: AC
  Filled 2018-06-04: qty 5

## 2018-06-04 MED ORDER — FLUTICASONE PROPIONATE 50 MCG/ACT NA SUSP
2.0000 | Freq: Every day | NASAL | Status: DC
  Filled 2018-06-04: qty 16

## 2018-06-04 MED ORDER — GABAPENTIN 100 MG PO CAPS: 100.0000 mg | ORAL_CAPSULE | Freq: Two times a day (BID) | ORAL | Status: DC

## 2018-06-04 MED ORDER — CHLORHEXIDINE GLUCONATE CLOTH 2 % EX PADS: 6.0000 | MEDICATED_PAD | Freq: Once | CUTANEOUS | Status: DC

## 2018-06-04 MED ORDER — PROPRANOLOL HCL 40 MG PO TABS
40.0000 mg | ORAL_TABLET | Freq: Three times a day (TID) | ORAL | Status: DC
  Filled 2018-06-04: qty 1

## 2018-06-04 MED ORDER — LIDOCAINE 2% (20 MG/ML) 5 ML SYRINGE
INTRAMUSCULAR | Status: DC | PRN
  Administered 2018-06-04: 50 mg via INTRAVENOUS

## 2018-06-04 MED ORDER — ACETAMINOPHEN 325 MG PO TABS: 650.0000 mg | ORAL_TABLET | ORAL | Status: DC | PRN

## 2018-06-04 MED ORDER — BISACODYL 10 MG RE SUPP: 10.0000 mg | Freq: Every day | RECTAL | Status: DC | PRN

## 2018-06-04 MED ORDER — POLYETHYLENE GLYCOL 3350 17 G PO PACK: 17.0000 g | PACK | Freq: Every day | ORAL | Status: DC | PRN

## 2018-06-04 MED ORDER — SODIUM CHLORIDE 1 G PO TABS
1.0000 g | ORAL_TABLET | Freq: Three times a day (TID) | ORAL | Status: DC
  Filled 2018-06-04: qty 1

## 2018-06-04 MED ORDER — DEXAMETHASONE SODIUM PHOSPHATE 10 MG/ML IJ SOLN
INTRAMUSCULAR | Status: DC | PRN
  Administered 2018-06-04: 5 mg via INTRAVENOUS

## 2018-06-04 MED ORDER — FENTANYL CITRATE (PF) 250 MCG/5ML IJ SOLN
INTRAMUSCULAR | Status: DC | PRN
  Administered 2018-06-04: 100 ug via INTRAVENOUS

## 2018-06-04 MED ORDER — ACETAMINOPHEN 650 MG RE SUPP: 650.0000 mg | RECTAL | Status: DC | PRN

## 2018-06-04 MED ORDER — KETOROLAC TROMETHAMINE 15 MG/ML IJ SOLN
7.5000 mg | Freq: Four times a day (QID) | INTRAMUSCULAR | Status: DC
  Administered 2018-06-04: 7.5 mg via INTRAVENOUS

## 2018-06-04 MED ORDER — PANTOPRAZOLE SODIUM 40 MG PO TBEC: 40.0000 mg | DELAYED_RELEASE_TABLET | Freq: Every day | ORAL | Status: DC

## 2018-06-04 MED ORDER — LACTATED RINGERS IV SOLN: INTRAVENOUS | Status: DC

## 2018-06-04 MED ORDER — THROMBIN 5000 UNITS EX SOLR
CUTANEOUS | Status: DC | PRN
  Administered 2018-06-04 (×2): 5000 [IU] via TOPICAL

## 2018-06-04 MED ORDER — MEMANTINE HCL 5 MG PO TABS: 5.0000 mg | ORAL_TABLET | Freq: Two times a day (BID) | ORAL | Status: DC

## 2018-06-04 MED ORDER — ONDANSETRON HCL 4 MG PO TABS: 4.0000 mg | ORAL_TABLET | Freq: Four times a day (QID) | ORAL | Status: DC | PRN

## 2018-06-04 MED ORDER — LIDOCAINE-EPINEPHRINE 1 %-1:100000 IJ SOLN
INTRAMUSCULAR | Status: DC | PRN
  Administered 2018-06-04: 5 mL

## 2018-06-04 MED ORDER — CYCLOBENZAPRINE HCL 10 MG PO TABS
10.0000 mg | ORAL_TABLET | Freq: Three times a day (TID) | ORAL | Status: DC | PRN
  Administered 2018-06-04: 10 mg via ORAL

## 2018-06-04 MED ORDER — EPHEDRINE 5 MG/ML INJ
INTRAVENOUS | Status: AC
  Filled 2018-06-04: qty 10

## 2018-06-04 MED ORDER — BUPIVACAINE HCL (PF) 0.5 % IJ SOLN
INTRAMUSCULAR | Status: AC
  Filled 2018-06-04: qty 30

## 2018-06-04 MED ORDER — PHENOL 1.4 % MT LIQD: 1.0000 | OROMUCOSAL | Status: DC | PRN

## 2018-06-04 MED ORDER — CYCLOBENZAPRINE HCL 10 MG PO TABS
ORAL_TABLET | ORAL | Status: AC
  Filled 2018-06-04: qty 1

## 2018-06-04 MED ORDER — DONEPEZIL HCL 10 MG PO TABS
10.0000 mg | ORAL_TABLET | Freq: Every day | ORAL | Status: DC
  Filled 2018-06-04: qty 1

## 2018-06-04 MED ORDER — ACETAMINOPHEN 160 MG/5ML PO SOLN: 325.0000 mg | Freq: Once | ORAL | Status: DC

## 2018-06-04 MED ORDER — 0.9 % SODIUM CHLORIDE (POUR BTL) OPTIME
TOPICAL | Status: DC | PRN
  Administered 2018-06-04: 1000 mL

## 2018-06-04 MED ORDER — PROPOFOL 10 MG/ML IV BOLUS
INTRAVENOUS | Status: AC
  Filled 2018-06-04: qty 20

## 2018-06-04 MED ORDER — EPHEDRINE SULFATE-NACL 50-0.9 MG/10ML-% IV SOSY
PREFILLED_SYRINGE | INTRAVENOUS | Status: DC | PRN
  Administered 2018-06-04: 10 mg via INTRAVENOUS

## 2018-06-04 MED ORDER — FENTANYL CITRATE (PF) 100 MCG/2ML IJ SOLN
INTRAMUSCULAR | Status: AC
  Filled 2018-06-04: qty 2

## 2018-06-04 MED ORDER — THROMBIN 5000 UNITS EX SOLR
CUTANEOUS | Status: AC
  Filled 2018-06-04: qty 15000

## 2018-06-04 MED ORDER — LISINOPRIL 10 MG PO TABS: 10.0000 mg | ORAL_TABLET | Freq: Every day | ORAL | Status: DC

## 2018-06-04 MED ORDER — OXYCODONE-ACETAMINOPHEN 5-325 MG PO TABS
ORAL_TABLET | ORAL | Status: AC
  Filled 2018-06-04: qty 2

## 2018-06-04 MED ORDER — IPRATROPIUM BROMIDE 0.06 % NA SOLN
2.0000 | Freq: Two times a day (BID) | NASAL | Status: DC
  Filled 2018-06-04: qty 15

## 2018-06-04 MED ORDER — MEPERIDINE HCL 50 MG/ML IJ SOLN: 6.2500 mg | INTRAMUSCULAR | Status: DC | PRN

## 2018-06-04 MED ORDER — KETOROLAC TROMETHAMINE 30 MG/ML IJ SOLN
INTRAMUSCULAR | Status: AC
  Filled 2018-06-04: qty 1

## 2018-06-04 MED ORDER — DOCUSATE SODIUM 100 MG PO CAPS: 100.0000 mg | ORAL_CAPSULE | Freq: Two times a day (BID) | ORAL | Status: DC

## 2018-06-04 MED ORDER — OXYCODONE-ACETAMINOPHEN 5-325 MG PO TABS
1.0000 | ORAL_TABLET | ORAL | Status: DC | PRN
  Administered 2018-06-04: 2 via ORAL

## 2018-06-04 MED ORDER — SODIUM CHLORIDE 0.9% FLUSH: 3.0000 mL | Freq: Two times a day (BID) | INTRAVENOUS | Status: DC

## 2018-06-04 MED ORDER — ALBUTEROL SULFATE HFA 108 (90 BASE) MCG/ACT IN AERS
INHALATION_SPRAY | RESPIRATORY_TRACT | Status: DC | PRN
  Administered 2018-06-04: 1 via RESPIRATORY_TRACT

## 2018-06-04 MED ORDER — THROMBIN 5000 UNITS EX SOLR
OROMUCOSAL | Status: DC | PRN
  Administered 2018-06-04: 5 mL via TOPICAL

## 2018-06-04 MED ORDER — ONDANSETRON HCL 4 MG/2ML IJ SOLN: 4.0000 mg | Freq: Four times a day (QID) | INTRAMUSCULAR | Status: DC | PRN

## 2018-06-04 MED ORDER — FLEET ENEMA 7-19 GM/118ML RE ENEM: 1.0000 | ENEMA | Freq: Once | RECTAL | Status: DC | PRN

## 2018-06-04 MED ORDER — FUROSEMIDE 20 MG PO TABS: 20.0000 mg | ORAL_TABLET | Freq: Every day | ORAL | Status: DC

## 2018-06-04 MED ORDER — LACTATED RINGERS IV SOLN
INTRAVENOUS | Status: DC
  Administered 2018-06-04: 10:00:00 via INTRAVENOUS

## 2018-06-04 MED ORDER — DIVALPROEX SODIUM ER 500 MG PO TB24: 1000.0000 mg | ORAL_TABLET | Freq: Every day | ORAL | Status: DC

## 2018-06-04 MED ORDER — AMLODIPINE BESYLATE 5 MG PO TABS: 5.0000 mg | ORAL_TABLET | Freq: Every day | ORAL | Status: DC

## 2018-06-04 MED ORDER — SODIUM CHLORIDE 0.9 % IV SOLN
INTRAVENOUS | Status: DC | PRN
  Administered 2018-06-04: 30 ug/min via INTRAVENOUS

## 2018-06-04 MED ORDER — MENTHOL 3 MG MT LOZG: 1.0000 | LOZENGE | OROMUCOSAL | Status: DC | PRN

## 2018-06-04 MED ORDER — FENTANYL CITRATE (PF) 100 MCG/2ML IJ SOLN
25.0000 ug | INTRAMUSCULAR | Status: DC | PRN
  Administered 2018-06-04: 50 ug via INTRAVENOUS

## 2018-06-04 MED ORDER — SODIUM CHLORIDE 0.9% FLUSH: 3.0000 mL | INTRAVENOUS | Status: DC | PRN

## 2018-06-04 MED ORDER — LIDOCAINE-EPINEPHRINE 1 %-1:100000 IJ SOLN
INTRAMUSCULAR | Status: AC
  Filled 2018-06-04: qty 1

## 2018-06-04 MED ORDER — ACETAMINOPHEN 10 MG/ML IV SOLN: 1000.0000 mg | Freq: Once | INTRAVENOUS | Status: DC | PRN

## 2018-06-04 MED ORDER — HEMOSTATIC AGENTS (NO CHARGE) OPTIME
TOPICAL | Status: DC | PRN
  Administered 2018-06-04: 1 via TOPICAL

## 2018-06-04 MED ORDER — PROPOFOL 10 MG/ML IV BOLUS
INTRAVENOUS | Status: DC | PRN
  Administered 2018-06-04: 140 mg via INTRAVENOUS

## 2018-06-04 MED ORDER — SODIUM CHLORIDE 0.9 % IV SOLN
INTRAVENOUS | Status: DC | PRN
  Administered 2018-06-04: 500 mL

## 2018-06-04 MED ORDER — VANCOMYCIN HCL IN DEXTROSE 1-5 GM/200ML-% IV SOLN
1000.0000 mg | INTRAVENOUS | Status: AC
  Administered 2018-06-04: 1000 mg via INTRAVENOUS
  Filled 2018-06-04: qty 200

## 2018-06-04 MED ORDER — OXYCODONE-ACETAMINOPHEN 5-325 MG PO TABS: 1.0000 | ORAL_TABLET | ORAL | 0 refills | Status: DC | PRN

## 2018-06-04 MED ORDER — LEVOTHYROXINE SODIUM 25 MCG PO TABS: 125.0000 ug | ORAL_TABLET | Freq: Every day | ORAL | Status: DC

## 2018-06-04 MED ORDER — SUGAMMADEX SODIUM 200 MG/2ML IV SOLN
INTRAVENOUS | Status: DC | PRN
  Administered 2018-06-04: 200 mg via INTRAVENOUS

## 2018-06-04 MED ORDER — BUPIVACAINE HCL (PF) 0.5 % IJ SOLN
INTRAMUSCULAR | Status: DC | PRN
  Administered 2018-06-04: 20 mL
  Administered 2018-06-04: 5 mL

## 2018-06-04 MED ORDER — TEMAZEPAM 15 MG PO CAPS: 30.0000 mg | ORAL_CAPSULE | Freq: Every day | ORAL | Status: DC

## 2018-06-04 MED ORDER — PROMETHAZINE HCL 25 MG/ML IJ SOLN: 6.2500 mg | INTRAMUSCULAR | Status: DC | PRN

## 2018-06-04 MED ORDER — ACETAMINOPHEN 325 MG PO TABS: 325.0000 mg | ORAL_TABLET | Freq: Once | ORAL | Status: DC

## 2018-06-04 SURGICAL SUPPLY — 53 items
ADH SKN CLS APL DERMABOND .7 (GAUZE/BANDAGES/DRESSINGS) ×1
APL SKNCLS STERI-STRIP NONHPOA (GAUZE/BANDAGES/DRESSINGS)
BAG DECANTER FOR FLEXI CONT (MISCELLANEOUS) ×3 IMPLANT
BENZOIN TINCTURE PRP APPL 2/3 (GAUZE/BANDAGES/DRESSINGS) IMPLANT
BLADE CLIPPER SURG (BLADE) IMPLANT
CARTRIDGE OIL MAESTRO DRILL (MISCELLANEOUS) IMPLANT
CLOSURE WOUND 1/2 X4 (GAUZE/BANDAGES/DRESSINGS)
COVER WAND RF STERILE (DRAPES) ×3 IMPLANT
DECANTER SPIKE VIAL GLASS SM (MISCELLANEOUS) ×3 IMPLANT
DERMABOND ADVANCED (GAUZE/BANDAGES/DRESSINGS) ×2
DERMABOND ADVANCED .7 DNX12 (GAUZE/BANDAGES/DRESSINGS) ×1 IMPLANT
DIFFUSER DRILL AIR PNEUMATIC (MISCELLANEOUS) IMPLANT
DRAPE LAPAROTOMY 100X72X124 (DRAPES) ×3 IMPLANT
DRAPE POUCH INSTRU U-SHP 10X18 (DRAPES) ×3 IMPLANT
DURAPREP 26ML APPLICATOR (WOUND CARE) ×3 IMPLANT
ELECT REM PT RETURN 9FT ADLT (ELECTROSURGICAL) ×3
ELECTRODE REM PT RTRN 9FT ADLT (ELECTROSURGICAL) ×1 IMPLANT
GAUZE 4X4 16PLY RFD (DISPOSABLE) IMPLANT
GLOVE BIO SURGEON STRL SZ 6.5 (GLOVE) ×3 IMPLANT
GLOVE BIO SURGEONS STRL SZ 6.5 (GLOVE) ×3
GLOVE BIOGEL PI IND STRL 6.5 (GLOVE) ×4 IMPLANT
GLOVE BIOGEL PI IND STRL 8.5 (GLOVE) ×1 IMPLANT
GLOVE BIOGEL PI INDICATOR 6.5 (GLOVE) ×8
GLOVE BIOGEL PI INDICATOR 8.5 (GLOVE) ×2
GLOVE ECLIPSE 8.5 STRL (GLOVE) ×3 IMPLANT
GLOVE EXAM NITRILE XL STR (GLOVE) IMPLANT
GLOVE SURG SS PI 6.0 STRL IVOR (GLOVE) ×12 IMPLANT
GOWN STRL REUS W/ TWL LRG LVL3 (GOWN DISPOSABLE) IMPLANT
GOWN STRL REUS W/ TWL XL LVL3 (GOWN DISPOSABLE) IMPLANT
GOWN STRL REUS W/TWL 2XL LVL3 (GOWN DISPOSABLE) ×9 IMPLANT
GOWN STRL REUS W/TWL LRG LVL3 (GOWN DISPOSABLE) ×6
GOWN STRL REUS W/TWL XL LVL3 (GOWN DISPOSABLE)
HEMOSTAT POWDER KIT SURGIFOAM (HEMOSTASIS) ×2 IMPLANT
KIT BASIN OR (CUSTOM PROCEDURE TRAY) ×3 IMPLANT
KIT TURNOVER KIT B (KITS) ×3 IMPLANT
NEEDLE HYPO 22GX1.5 SAFETY (NEEDLE) ×3 IMPLANT
NS IRRIG 1000ML POUR BTL (IV SOLUTION) ×3 IMPLANT
OIL CARTRIDGE MAESTRO DRILL (MISCELLANEOUS)
PACK LAMINECTOMY NEURO (CUSTOM PROCEDURE TRAY) ×3 IMPLANT
PAD ARMBOARD 7.5X6 YLW CONV (MISCELLANEOUS) ×7 IMPLANT
SPONGE LAP 4X18 RFD (DISPOSABLE) IMPLANT
SPONGE SURGIFOAM ABS GEL SZ50 (HEMOSTASIS) ×3 IMPLANT
STAPLER SKIN PROX WIDE 3.9 (STAPLE) IMPLANT
STRIP CLOSURE SKIN 1/2X4 (GAUZE/BANDAGES/DRESSINGS) IMPLANT
SUT SILK 0 TIES 10X30 (SUTURE) IMPLANT
SUT VIC AB 0 CT1 18XCR BRD8 (SUTURE) ×1 IMPLANT
SUT VIC AB 0 CT1 8-18 (SUTURE) ×3
SUT VIC AB 2-0 CP2 18 (SUTURE) ×3 IMPLANT
SUT VIC AB 3-0 SH 8-18 (SUTURE) ×3 IMPLANT
SYR CONTROL 10ML LL (SYRINGE) ×3 IMPLANT
TOWEL GREEN STERILE (TOWEL DISPOSABLE) ×3 IMPLANT
TOWEL GREEN STERILE FF (TOWEL DISPOSABLE) ×3 IMPLANT
WATER STERILE IRR 1000ML POUR (IV SOLUTION) ×3 IMPLANT

## 2018-06-04 NOTE — Op Note (Signed)
Date of surgery: 06/04/2018 Preoperative diagnosis: Spinal cord stimulator malfunction with broken stimulator leads Removal of spinal cord stimulator Surgeon: Kristeen Miss Anesthesia: General endotracheal Indications: Destiny Hughes is a 73 year old individual who had a thoracic spinal cord stimulator placed for postthoracotomy pain a number of years ago.  She found that over time this has become not useful in controlling her pain and it was determined a few years ago that she had some broken leads she is not keen on getting it repaired as it had become ineffective.  She needs to obtain an MRI of the brain but because of the spinal cord stimulator she is unable to do so she therefore requests that the spinal cord stimulator be removed.  Procedure: The patient was brought to the operating room supine on the stretcher.  After the smooth induction of general endotracheal anesthesia, she was carefully turned prone.  The back was prepped with alcohol DuraPrep and draped in a sterile fashion.  An incision was made over the previous laminotomy site in the mid thoracic spine and this was carried down to the thoracodorsal fascia.  A small bump could be felt where the leads were going into this region and the leads were uncovered.  As the leads were uncovered gently dissecting the tissues away it was clear that there was a fracture through at least 1 of the wires were some of the strands of metal had been coming through.  This was further uncovered and it was noted that the one lead was clearly fractured the other was released from a holding ring.  Then a small laminotomy was created with leads entered under the lamina.  This allowed to free the paddle electrode and then the paddle electrode could be removed en bloc.  A second incision was made over the spinal cord stimulator generator.  The incision was carried down to the generator itself and then the pocket that the generator was then was opened then by  gradually manipulating the stimulator generator it could be brought out through the incision.  The leads were then cut sharply with scissors and the leads were pulled out through the thoracic incision.  The inferior incision was inspected for any debris none was found and then the incisions were both closed with 2-0 Vicryl in the thoracodorsal fascia with 2-0 Vicryl in the subcutaneous tissues and 3-0 Vicryl subcuticularly.  Dermabond was placed on the skin after a 3-0 subcuticular closure.  Blood loss for the procedure was nil.  Patient was returned to recovery room in stable condition.

## 2018-06-04 NOTE — Discharge Summary (Signed)
Physician Discharge Summary  Patient ID: CATHEY FREDENBURG MRN: [id number] DOB/AGE: [birth year] 73 y.o.  Admit date: 06/04/2018 Discharge date: 06/04/2018  Admission Diagnoses:nonfunctioning spinal cord stimulator  Discharge Diagnoses: Non functioning spinal cord stimulator Active Problems:   Spinal cord stimulator dysfunction ([place])   Discharged Condition: good  Hospital Course: uneventful  Consults: None  Significant Diagnostic Studies: none  Treatments: removal of spinal cord stimulator  Discharge Exam: Blood pressure (!) 166/77, pulse 64, temperature (!) 97.4 F (36.3 C), temperature source Oral, resp. rate 18, height 5\' 6"  (1.676 m), weight 86.6 kg, SpO2 94 %. normal gait and station  Disposition: Discharge disposition: 01-Home or Self Care       Discharge Instructions    Call MD for:  redness, tenderness, or signs of infection (pain, swelling, redness, odor or green/yellow discharge around incision site)   Complete by:  As directed    Call MD for:  severe uncontrolled pain   Complete by:  As directed    Call MD for:  temperature >100.4   Complete by:  As directed    Diet - low sodium heart healthy   Complete by:  As directed    Discharge instructions   Complete by:  As directed    Okay to shower. Do not apply salves or appointments to incision. No heavy lifting with the upper extremities greater than 15 pounds. May resume driving when not requiring pain medication and patient feels comfortable with doing so.   Incentive spirometry RT   Complete by:  As directed    Increase activity slowly   Complete by:  As directed      Allergies as of 06/04/2018      Reactions   Albuterol Shortness Of Breath, Other (See Comments)   Penicillins Anaphylaxis, Swelling, Other (See Comments)   Has patient had a PCN reaction causing immediate rash, facial/tongue/throat swelling, SOB or lightheadedness with hypotension: Yes Has patient had a PCN reaction causing severe rash  involving mucus membranes or skin necrosis: Rash Has patient had a PCN reaction that required hospitalization: No Has patient had a PCN reaction occurring within the last 10 years: No If all of the above answers are "NO", then may proceed with Cephalosporin use.   Sulfa Antibiotics Swelling, Other (See Comments)   Throat swells, but no shortness of breath noted   Codeine Nausea And Vomiting   Ecotrin [aspirin] Nausea And Vomiting   Zanaflex [tizanidine] Other (See Comments)   Possible confusion      Medication List    TAKE these medications   acetaminophen 325 MG tablet Commonly known as:  TYLENOL Take 650 mg by mouth every 6 (six) hours as needed (pain.).   amLODipine 5 MG tablet Commonly known as:  NORVASC TAKE ONE TABLET EACH DAY   buPROPion 150 MG 24 hr tablet Commonly known as:  Wellbutrin XL Take 1 tablet (150 mg total) by mouth every morning.   divalproex 500 MG 24 hr tablet Commonly known as:  DEPAKOTE ER 2  qam What changed:    how much to take  how to take this  when to take this  additional instructions   donepezil 10 MG tablet Commonly known as:  ARICEPT 1  qhs What changed:    how much to take  how to take this  when to take this  additional instructions   fluticasone 50 MCG/ACT nasal spray Commonly known as:  FLONASE Place 2 sprays into both nostrils daily.   furosemide 20 MG tablet  Commonly known as:  LASIX Take 1 tablet (20 mg total) by mouth daily.   gabapentin 100 MG capsule Commonly known as:  NEURONTIN TAKE ONE CAPSULE TWICE A DAY   ipratropium 0.03 % nasal spray Commonly known as:  ATROVENT Place 2 sprays into both nostrils every 12 (twelve) hours.   levalbuterol 0.63 MG/3ML nebulizer solution Commonly known as:  Xopenex Take 3 mLs (0.63 mg total) by nebulization every 4 (four) hours as needed for wheezing or shortness of breath.   levothyroxine 125 MCG tablet Commonly known as:  SYNTHROID, LEVOTHROID TAKE ONE TABLET  DAILY BEFORE BREAKFAST What changed:  See the new instructions.   lisinopril 10 MG tablet Commonly known as:  PRINIVIL,ZESTRIL Take 10 mg by mouth daily.   lisinopril 5 MG tablet Commonly known as:  PRINIVIL,ZESTRIL TAKE ONE TABLET EACH DAY   memantine 5 MG tablet Commonly known as:  NAMENDA 1  bid What changed:    how much to take  how to take this  when to take this  additional instructions   oxyCODONE-acetaminophen 5-325 MG tablet Commonly known as:  PERCOCET/ROXICET Take 1-2 tablets by mouth every 3 (three) hours as needed for moderate pain or severe pain.   pantoprazole 40 MG tablet Commonly known as:  PROTONIX TAKE ONE TABLET DAILY   propranolol 80 MG tablet Commonly known as:  INDERAL TAKE ONE-HALF TABLET 3 TIMES DAILY What changed:  See the new instructions.   sodium chloride 1 g tablet TAKE ONE TABLET THREE TIMES DAILY   temazepam 30 MG capsule Commonly known as:  RESTORIL Take 30 mg by mouth at bedtime.   umeclidinium-vilanterol 62.5-25 MCG/INH Aepb Commonly known as:  Anoro Ellipta Inhale 1 puff into the lungs daily.        Signed: Blanchie Dessert Rolande Moe 06/04/2018, 5:06 PM

## 2018-06-04 NOTE — Transfer of Care (Signed)
Immediate Anesthesia Transfer of Care Note  Patient: Destiny Hughes  Procedure(s) Performed: Removal of spinal cord stimulator, thoracic paddle and generator (N/A Back)  Patient Location: PACU  Anesthesia Type:General  Level of Consciousness: awake, alert , oriented and patient cooperative  Airway & Oxygen Therapy: Patient Spontanous Breathing  Post-op Assessment: Report given to RN, Post -op Vital signs reviewed and stable and Patient moving all extremities X 4  Post vital signs: Reviewed and stable  Last Vitals:  Vitals Value Taken Time  BP 158/103 06/04/2018  2:47 PM  Temp    Pulse 57 06/04/2018  2:48 PM  Resp 21 06/04/2018  2:48 PM  SpO2 98 % 06/04/2018  2:48 PM  Vitals shown include unvalidated device data.  Last Pain:  Vitals:   06/04/18 1007  TempSrc:   PainSc: 0-No pain      Patients Stated Pain Goal: 3 (16/38/46 6599)  Complications: No apparent anesthesia complications

## 2018-06-04 NOTE — Progress Notes (Signed)
Patient ID: Destiny Hughes, female   DOB: [birth year], 73 y.o.   MRN: [id number] Stable post op. Voiding Dressing dry Ok to discharge

## 2018-06-04 NOTE — Discharge Instructions (Signed)
Wound Care Leave incision open to air. You may shower. Do not scrub directly on incision.  Do not put any creams, lotions, or ointments on incision. Activity Walk each and every day, increasing distance each day. No lifting greater than 5 lbs.  Avoid excessive neck motion. No driving for 2 weeks; may ride as a passenger locally. Diet Resume your normal diet.  Return to Work Will be discussed at you follow up appointment. Call Your Doctor If Any of These Occur Redness, drainage, or swelling at the wound.  Temperature greater than 101 degrees. Severe pain not relieved by pain medication. Increased difficulty swallowing. Incision starts to come apart. Follow Up Appt Call today for appointment in 2-4 weeks ([phone]) or for problems.  If you have any hardware placed in your spine, you will need an x-ray before your appointment. 

## 2018-06-04 NOTE — Anesthesia Postprocedure Evaluation (Signed)
Anesthesia Post Note  Patient: Destiny Hughes  Procedure(s) Performed: Removal of spinal cord stimulator, thoracic paddle and generator (N/A Back)     Patient location during evaluation: PACU Anesthesia Type: General Level of consciousness: awake and alert Pain management: pain level controlled Vital Signs Assessment: post-procedure vital signs reviewed and stable Respiratory status: spontaneous breathing, nonlabored ventilation, respiratory function stable and patient connected to nasal cannula oxygen Cardiovascular status: blood pressure returned to baseline and stable Postop Assessment: no apparent nausea or vomiting Anesthetic complications: no    Last Vitals:  Vitals:   06/04/18 1447 06/04/18 1601  BP: (!) 158/103 (!) 166/77  Pulse: (!) 57 64  Resp: (!) 23 18  Temp:  (!) 36.3 C  SpO2: 100% 94%    Last Pain:  Vitals:   06/04/18 1601  TempSrc: Oral  PainSc:                  Effie Berkshire

## 2018-06-04 NOTE — Evaluation (Signed)
Physical Therapy Evaluation Patient Details Name: Destiny Hughes MRN: [id number] DOB: [birth year] Today's Date: 06/04/2018   History of Present Illness  Pt is a 73 y/o female s/p removal of spinal cord stimulator. PMH includes COPD, depression, glaucoma, HTN, s/p ACDF, s/p spinal cord stimulator.   Clinical Impression  Pt admitted secondary to problem above with deficits below. Pt with some unsteadiness, and wooziness during mobility which limited ambulation. Required min guard to min A for mobility without AD. Discussed use of RW, however, pt and pt's son reports pt would rather use cane. Educated son about how to assist pt with stair navigation. Will continue to follow acutely to maximize functional mobility independence and safety.     Follow Up Recommendations Home health PT;Supervision/Assistance - 24 hour    Equipment Recommendations  None recommended by PT    Recommendations for Other Services       Precautions / Restrictions Precautions Precautions: Back Precaution Booklet Issued: No Precaution Comments: Reviewed back precautions with pt.  Restrictions Weight Bearing Restrictions: No      Mobility  Bed Mobility               General bed mobility comments: In bathroom upon entry.   Transfers Overall transfer level: Needs assistance Equipment used: None Transfers: Sit to/from Stand Sit to Stand: Min guard         General transfer comment: Min guard for safety.   Ambulation/Gait Ambulation/Gait assistance: Min guard;Min assist Gait Distance (Feet): 100 Feet Assistive device: None Gait Pattern/deviations: Step-through pattern;Decreased stride length;Drifts right/left Gait velocity: Decreased    General Gait Details: Slow, unsteady gait. Required min guard to occasional min A for steadying. Pt reaching out for handrail in hall for increase steadying. Discussed using RW, however, pt and son reports they would prefer for pt to use cane.    Stairs Stairs: Yes       General stair comments: Pt unsteady and reports wooziness, so did not get to attempt stair training, however, did verbally review safe sequencing during stair management, and how to assist pt appropriately with stair navigation.   Wheelchair Mobility    Modified Rankin (Stroke Patients Only)       Balance Overall balance assessment: Needs assistance Sitting-balance support: No upper extremity supported;Feet supported Sitting balance-Leahy Scale: Good     Standing balance support: No upper extremity supported;During functional activity Standing balance-Leahy Scale: Fair                               Pertinent Vitals/Pain Pain Assessment: Faces Faces Pain Scale: Hurts little more Pain Location: back  Pain Descriptors / Indicators: Aching;Operative site guarding Pain Intervention(s): Limited activity within patient's tolerance;Monitored during session;Repositioned    Home Living Family/patient expects to be discharged to:: Private residence Living Arrangements: Spouse/significant other Available Help at Discharge: Family;Available 24 hours/day Type of Home: House Home Access: Stairs to enter Entrance Stairs-Rails: Left Entrance Stairs-Number of Steps: 2 Home Layout: One level Home Equipment: Cane - single point;Shower seat      Prior Function Level of Independence: Needs assistance   Gait / Transfers Assistance Needed: Reports occasional use of cane, however, otherwise will furniture walk   ADL's / Homemaking Assistance Needed: Son reports pt's husband assists with ADLs.         Hand Dominance        Extremity/Trunk Assessment   Upper Extremity Assessment Upper Extremity Assessment: Defer to OT evaluation  Lower Extremity Assessment Lower Extremity Assessment: Generalized weakness    Cervical / Trunk Assessment Cervical / Trunk Assessment: Other exceptions Cervical / Trunk Exceptions: s/p spinal cord  stimulator removal.   Communication   Communication: No difficulties  Cognition Arousal/Alertness: Awake/alert Behavior During Therapy: WFL for tasks assessed/performed Overall Cognitive Status: History of cognitive impairments - at baseline                                 General Comments: Per pt's son, pt with dementia at baseline       General Comments General comments (skin integrity, edema, etc.): [name] son present throughout session.     Exercises     Assessment/Plan    PT Assessment Patient needs continued PT services  PT Problem List Decreased strength;Decreased balance;Decreased mobility;Decreased cognition;Decreased knowledge of use of DME;Decreased safety awareness;Pain       PT Treatment Interventions Gait training;DME instruction;Stair training;Functional mobility training;Therapeutic activities;Therapeutic exercise;Balance training;Patient/family education    PT Goals (Current goals can be found in the Care Plan section)  Acute Rehab PT Goals Patient Stated Goal: to go home tonight PT Goal Formulation: With patient Time For Goal Achievement: 06/18/18 Potential to Achieve Goals: Good    Frequency Min 5X/week   Barriers to discharge        Co-evaluation               AM-PAC PT "6 Clicks" Mobility  Outcome Measure Help needed turning from your back to your side while in a flat bed without using bedrails?: A Little Help needed moving from lying on your back to sitting on the side of a flat bed without using bedrails?: A Little Help needed moving to and from a bed to a chair (including a wheelchair)?: A Little Help needed standing up from a chair using your arms (e.g., wheelchair or bedside chair)?: A Little Help needed to walk in hospital room?: A Little Help needed climbing 3-5 steps with a railing? : A Lot 6 Click Score: 17    End of Session   Activity Tolerance: Treatment limited secondary to medical complications  (Comment)("wooziness") Patient left: in bed;with call bell/phone within reach;with family/visitor present(sitting EOB ) Nurse Communication: Mobility status PT Visit Diagnosis: Unsteadiness on feet (R26.81);Muscle weakness (generalized) (M62.81)    Time: 1701-1720 PT Time Calculation (min) (ACUTE ONLY): 19 min   Charges:   PT Evaluation $PT Eval Low Complexity: [place], PT, DPT  Acute Rehabilitation Services  Pager: [phone] Office: [phone]   Rudean Hitt 06/04/2018, 5:35 PM

## 2018-06-04 NOTE — Anesthesia Procedure Notes (Signed)
Procedure Name: Intubation Date/Time: 06/04/2018 1:59 PM Performed by: Julieta Bellini, CRNA Pre-anesthesia Checklist: Patient identified, Emergency Drugs available, Suction available and Patient being monitored Patient Re-evaluated:Patient Re-evaluated prior to induction Oxygen Delivery Method: Circle system utilized Preoxygenation: Pre-oxygenation with 100% oxygen Induction Type: IV induction Ventilation: Mask ventilation without difficulty Laryngoscope Size: 3 and Mac Grade View: Grade I Tube type: Oral Tube size: 7.0 mm Number of attempts: 1 Airway Equipment and Method: Stylet and Oral airway Placement Confirmation: ETT inserted through vocal cords under direct vision,  positive ETCO2 and breath sounds checked- equal and bilateral Secured at: 21 cm Tube secured with: Tape Dental Injury: Teeth and Oropharynx as per pre-operative assessment

## 2018-06-04 NOTE — H&P (Signed)
Destiny Hughes is an 73 y.o. female.   Chief Complaint: Nonfunctional spinal cord stimulator HPI: Destiny Hughes is a 73 year old individual who had a spinal cord stimulator placed about 12 years ago for postthoracotomy pain syndrome.  She notes that it lost its effectiveness and she has not used it in a number of years.  She has necessity to obtain an MRI of various parts of her body including her brain because of concerns of some early stage dementia.  She would like to have a spinal cord stimulator removed.  She is now admitted for that procedure.  Past Medical History:  Diagnosis Date  . Arthritis    OA- knees & back  neck  . Chronic back pain   . Cognitive retention disorder   . Collapse of right lung   . COPD (chronic obstructive pulmonary disease) (HCC)    Hx. Bronchitis occ, 01-25-11 somes issue now taking  Z-pak-started 01-24-11/Scar tissue  present  from previous lung collapse  . Dementia with psychosis ([place])   . Depression   . Depression   . Dyspnea   . Dysrhythmia 2018   treated with Ablation   . GERD (gastroesophageal reflux disease)    tx. TUMS  . Glaucoma   . Headache   . History of kidney stones    surgically treated  . Hyperlipemia   . Hypertension   . Hypothyroidism    tx. Levothyroxine  . Major depression with psychotic features ([place])   . Nephrolithiasis   . Neuromuscular disorder ([place]) 01-25-11   Pain/nerve stimulator implanted -2 yrs ago.  Marland Kitchen Reflex, gag, absent   . Reflex, gag, absent     Past Surgical History:  Procedure Laterality Date  . A-FLUTTER ABLATION N/A 08/16/2016   Procedure: A-Flutter Ablation;  Surgeon: Evans Lance, MD;  Location: [facility];  Service: Cardiovascular;  Laterality: N/A;  . ANTERIOR CERVICAL DECOMP/DISCECTOMY FUSION     3 levels, per family  . APPENDECTOMY  01-25-11  . CARPAL TUNNEL RELEASE  01-25-11   [place].  . CERVICAL SPINE SURGERY  01-25-11   2005-multiple levels  . CHOLECYSTECTOMY  01-25-11   '07  .  COLONOSCOPY WITH PROPOFOL N/A 07/23/2012   Procedure: COLONOSCOPY WITH PROPOFOL;  Surgeon: Garlan Fair, MD;  Location: [facility];  Service: Endoscopy;  Laterality: N/A;  . detatched retna Bilateral    done July & August- 2019  . EYE SURGERY Bilateral 2019   repair of retina detachment- Dr. Zadie Rhine at surgical center   . JOINT REPLACEMENT  01-25-11   6'03 LTHA-hemi  . KNEE ARTHROPLASTY  01-25-11   '04-right, revised x2  . no gag reflex     Pt does not havea gag reflex following  siurgery to neck. Family unsure of date. Pt takes pill in apple sauce.  . SPINAL CORD STIMULATOR BATTERY EXCHANGE N/A 01/12/2018   Procedure: Spinal cord stimulator battery replacement;  Surgeon: Clydell Hakim, MD;  Location: [facility];  Service: Neurosurgery;  Laterality: N/A;  left  . SPINAL CORD STIMULATOR IMPLANT  2009 APPROX   DR. Urho Rio  . THORACIC SPINE SURGERY  01-25-11   6'02- then nerve stimulator implanted after  . TOTAL KNEE REVISION  02/01/2011   Procedure: TOTAL KNEE REVISION;  Surgeon: Mauri Pole;  Location: [facility];  Service: Orthopedics;  Laterality: Right;  . TUBAL LIGATION      Family History  Problem Relation Age of Onset  . Heart attack Father   . Emphysema Father   .  Aneurysm Mother        CEREBRAL  . Emphysema Mother   . Dementia Mother   . Diabetes Son   . Hypertension Son   . Hypertension Son   . Cancer Sister        Widely Metastatic  . Asthma Son        x2  . Breast cancer Paternal Aunt   . Rheum arthritis Maternal Aunt    Social History:  reports that she quit smoking about 2 years ago. Her smoking use included cigarettes. She started smoking about 56 years ago. She has a 37.50 pack-year smoking history. She has never used smokeless tobacco. She reports that she does not drink alcohol or use drugs.  Allergies:  Allergies  Allergen Reactions  . Albuterol Shortness Of Breath and Other (See Comments)  . Penicillins Anaphylaxis, Swelling and Other (See Comments)    Has  patient had a PCN reaction causing immediate rash, facial/tongue/throat swelling, SOB or lightheadedness with hypotension: Yes Has patient had a PCN reaction causing severe rash involving mucus membranes or skin necrosis: Rash Has patient had a PCN reaction that required hospitalization: No Has patient had a PCN reaction occurring within the last 10 years: No If all of the above answers are "NO", then may proceed with Cephalosporin use.  . Sulfa Antibiotics Swelling and Other (See Comments)    Throat swells, but no shortness of breath noted  . Codeine Nausea And Vomiting  . Ecotrin [Aspirin] Nausea And Vomiting  . Zanaflex [Tizanidine] Other (See Comments)    Possible confusion    No medications prior to admission.    No results found for this or any previous visit (from the past 48 hour(s)). No results found.  Review of Systems  Constitutional: Negative.   HENT: Negative.   Eyes: Negative.   Respiratory: Negative.   Cardiovascular: Negative.   Gastrointestinal: Negative.   Genitourinary: Negative.   Musculoskeletal: Positive for back pain.  Skin: Negative.   Neurological: Negative.   Endo/Heme/Allergies: Negative.   Psychiatric/Behavioral: Negative.     There were no vitals taken for this visit. Physical Exam  Constitutional: She is oriented to person, place, and time. She appears well-developed and well-nourished.  HENT:  Head: Normocephalic and atraumatic.  Eyes: Pupils are equal, round, and reactive to light. Conjunctivae and EOM are normal.  Neck: Normal range of motion. Neck supple.  Cardiovascular: Normal rate and regular rhythm.  Respiratory: Effort normal and breath sounds normal.  GI: Soft. Bowel sounds are normal.  Musculoskeletal:     Comments: Full range of motion is noted Station and gait are intact  Neurological: She is alert and oriented to person, place, and time. She has normal reflexes.  Station and gait are normal.  Well-healed midline incision and  flank incision from spinal cord stimulator generator.  Cranial nerve examination is normal.  Skin: Skin is warm and dry.  Psychiatric: She has a normal mood and affect. Her behavior is normal. Judgment and thought content normal.     Assessment/Plan Nonfunctioning spinal cord stimulator.  Plan: Removal of spinal cord stimulator generator and paddle electrode.  Earleen Newport, MD 06/04/2018, 7:46 AM

## 2018-06-04 NOTE — Progress Notes (Addendum)
Patient is discharged from room 3C10 at this time. Alert and in stable condition. IV site d/c'd and instructions read to patient and family with understanding verbalized. Left unit via wheelchair with all belongings at side.

## 2018-06-05 ENCOUNTER — Encounter (HOSPITAL_COMMUNITY): Payer: Self-pay | Admitting: Neurological Surgery

## 2018-06-08 ENCOUNTER — Encounter (HOSPITAL_COMMUNITY): Payer: Self-pay | Admitting: Psychiatry

## 2018-06-08 ENCOUNTER — Ambulatory Visit (INDEPENDENT_AMBULATORY_CARE_PROVIDER_SITE_OTHER): Payer: Medicare Other | Admitting: Psychiatry

## 2018-06-08 ENCOUNTER — Other Ambulatory Visit: Payer: Self-pay

## 2018-06-08 VITALS — BP 153/77 | HR 60 | Temp 97.5°F | Ht 66.0 in | Wt 189.0 lb

## 2018-06-08 DIAGNOSIS — F05 Delirium due to known physiological condition: Secondary | ICD-10-CM

## 2018-06-08 DIAGNOSIS — F0392 Unspecified dementia, unspecified severity, with psychotic disturbance: Secondary | ICD-10-CM

## 2018-06-08 DIAGNOSIS — F0390 Unspecified dementia without behavioral disturbance: Secondary | ICD-10-CM | POA: Diagnosis not present

## 2018-06-08 NOTE — Progress Notes (Signed)
Psychiatric Initial Adult Assessment   Patient Identification: Destiny Hughes MRN:  [id number] Date of Evaluation:  06/08/2018 Referral Source: [facility] Chief Complaint: Depression  Today the patient is seen with her husband and her son.  The patient recently had her TENS unit removed from her back.  Apparently this was done in an effort to be able to have an MRI of her brain.  It is not clear who actually wanted the MRI to go to turn out that I am the one who is going to order an MRI of the brain.  The patient has a very confounded confusing past psychiatric history.  Approximately a decade ago she began having chronic back pain and was treated with opiates.  She apparently got to the point where she was on methadone.  It became a problem for her.  Approximately 3 years ago her sister died.  She became very depressed at that time.  Previous to that she had no psychiatric history.  Her family claims that since being depressed her memory seem to change and she became much less functional.  Previous to that she was driving and cooking and playing a big role in their family.  After her sister's death the patient became so upset she stopped doing very much at all.  Eventually a year later approximately 2 years ago the patient was so distressed that she kept going to the emergency room.  There was belief that it was due to a physical thing but that was not clear 5.  Instead determined that she ended up going to the [facility] psychiatric hospital.  She spent 2 weeks there.  She was placed on I believe some Alzheimer's medicines and her psychiatric medicines were adjusted.  Since then the patient has done poorly.  On her last visit she was crying a great deal.  She is very somatic.  We began on Wellbutrin.  According to her family she is now showing some improvement.  She no longer is chronic.  She is now alert feels better about herself but her energy level is still low.  She continues  taking Restoril for sleep.  Her appetite is good.  She is 3 chronic medical problems.  She complains of chronic headaches, back pain that comes and goes and a history of palpitations.  On the other hand the patient still enjoys things.  She likes watching TV particularly about abdominal programs.  The patient denies being suicidal.  Recently the person who operated on her offered her some opiates for pain control.  In reality the patient is having minimal pain and I recommended that she hold off taking any opiates on a regular basis. (Hypo) Manic Symptoms:   Anxiety Symptoms:   Psychotic Symptoms:   PTSD Symptoms:   Past Psychiatric History: none  Previous Psychotropic Medications: Yes   Substance Abuse History in the last 12 months:  No.  Consequences of Substance Abuse:   Past Medical History:  Past Medical History:  Diagnosis Date  . Arthritis    OA- knees & back  neck  . Chronic back pain   . Cognitive retention disorder   . Collapse of right lung   . COPD (chronic obstructive pulmonary disease) (HCC)    Hx. Bronchitis occ, 01-25-11 somes issue now taking  Z-pak-started 01-24-11/Scar tissue  present  from previous lung collapse  . Dementia with psychosis ([place])   . Depression   . Depression   . Dyspnea   . Dysrhythmia 2018  treated with Ablation   . GERD (gastroesophageal reflux disease)    tx. TUMS  . Glaucoma   . Headache   . History of kidney stones    surgically treated  . Hyperlipemia   . Hypertension   . Hypothyroidism    tx. Levothyroxine  . Major depression with psychotic features ([place])   . Nephrolithiasis   . Neuromuscular disorder ([place]) 01-25-11   Pain/nerve stimulator implanted -2 yrs ago.  Marland Kitchen Reflex, gag, absent   . Reflex, gag, absent     Past Surgical History:  Procedure Laterality Date  . A-FLUTTER ABLATION N/A 08/16/2016   Procedure: A-Flutter Ablation;  Surgeon: Evans Lance, MD;  Location: [facility];  Service: Cardiovascular;   Laterality: N/A;  . ANTERIOR CERVICAL DECOMP/DISCECTOMY FUSION     3 levels, per family  . APPENDECTOMY  01-25-11  . CARPAL TUNNEL RELEASE  01-25-11   Bil.  . CERVICAL SPINE SURGERY  01-25-11   2005-multiple levels  . CHOLECYSTECTOMY  01-25-11   '07  . COLONOSCOPY WITH PROPOFOL N/A 07/23/2012   Procedure: COLONOSCOPY WITH PROPOFOL;  Surgeon: Garlan Fair, MD;  Location: [facility];  Service: Endoscopy;  Laterality: N/A;  . detatched retna Bilateral    done July & August- 2019  . EYE SURGERY Bilateral 2019   repair of retina detachment- Dr. Zadie Rhine at surgical center   . JOINT REPLACEMENT  01-25-11   6'03 LTHA-hemi  . KNEE ARTHROPLASTY  01-25-11   '04-right, revised x2  . no gag reflex     Pt does not havea gag reflex following  siurgery to neck. Family unsure of date. Pt takes pill in apple sauce.  . SPINAL CORD STIMULATOR BATTERY EXCHANGE N/A 01/12/2018   Procedure: Spinal cord stimulator battery replacement;  Surgeon: Clydell Hakim, MD;  Location: [facility];  Service: Neurosurgery;  Laterality: N/A;  left  . SPINAL CORD STIMULATOR IMPLANT  2009 APPROX   DR. ELSNER  . SPINAL CORD STIMULATOR REMOVAL N/A 06/04/2018   Procedure: Removal of spinal cord stimulator, thoracic paddle and generator;  Surgeon: Kristeen Miss, MD;  Location: [facility];  Service: Neurosurgery;  Laterality: N/A;  . THORACIC SPINE SURGERY  01-25-11   6'02- then nerve stimulator implanted after  . TOTAL KNEE REVISION  02/01/2011   Procedure: TOTAL KNEE REVISION;  Surgeon: Mauri Pole;  Location: [facility];  Service: Orthopedics;  Laterality: Right;  . TUBAL LIGATION      Family Psychiatric History:   Family History:  Family History  Problem Relation Age of Onset  . Heart attack Father   . Emphysema Father   . Aneurysm Mother        CEREBRAL  . Emphysema Mother   . Dementia Mother   . Diabetes Son   . Hypertension Son   . Hypertension Son   . Cancer Sister        Widely Metastatic  . Asthma Son        x2  .  Breast cancer Paternal Aunt   . Rheum arthritis Maternal Aunt     Social History:   Social History   Socioeconomic History  . Marital status: Married    Spouse name: Not on file  . Number of children: 2  . Years of education: Not on file  . Highest education level: Not on file  Occupational History  . Occupation: disabled  Social Needs  . Financial resource strain: Not on file  . Food insecurity:    Worry:  Not on file    Inability: Not on file  . Transportation needs:    Medical: Not on file    Non-medical: Not on file  Tobacco Use  . Smoking status: Former Smoker    Packs/day: 0.75    Years: 50.00    Pack years: 37.50    Types: Cigarettes    Start date: 09/13/1961    Last attempt to quit: 05/2016    Years since quitting: 2.0  . Smokeless tobacco: Never Used  . Tobacco comment: Decreased Down to 2/3 a Day if Thinking Abouit It  Substance and Sexual Activity  . Alcohol use: No    Alcohol/week: 0.0 standard drinks    Comment: none in 10 years  . Drug use: No  . Sexual activity: Never  Lifestyle  . Physical activity:    Days per week: Not on file    Minutes per session: Not on file  . Stress: Not on file  Relationships  . Social connections:    Talks on phone: Not on file    Gets together: Not on file    Attends religious service: Not on file    Active member of club or organization: Not on file    Attends meetings of clubs or organizations: Not on file    Relationship status: Not on file  Other Topics Concern  . Not on file  Social History Narrative   She is from [facility]. She has always lived in [place]. She has traveled to [place], [place], [place], [place], & [place]. 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 Prior exposure to parakeets with last exposure remote in her current home. Previously had mold in her home that was in her bathroom & fixed. Prior exposure to hot tubs  but none recently. Pt lives at home with [name], husband.  Has 2 childrean, 12th grade education.      Additional Social History:   Allergies:   Allergies  Allergen Reactions  . Albuterol Shortness Of Breath and Other (See Comments)  . Penicillins Anaphylaxis, Swelling and Other (See Comments)    Has patient had a PCN reaction causing immediate rash, facial/tongue/throat swelling, SOB or lightheadedness with hypotension: Yes Has patient had a PCN reaction causing severe rash involving mucus membranes or skin necrosis: Rash Has patient had a PCN reaction that required hospitalization: No Has patient had a PCN reaction occurring within the last 10 years: No If all of the above answers are "NO", then may proceed with Cephalosporin use.  . Sulfa Antibiotics Swelling and Other (See Comments)    Throat swells, but no shortness of breath noted  . Codeine Nausea And Vomiting  . Ecotrin [Aspirin] Nausea And Vomiting  . Zanaflex [Tizanidine] Other (See Comments)    Possible confusion    Metabolic Disorder Labs: Lab Results  Component Value Date   HGBA1C 6.4 (H) 04/28/2015   MPG 137 (H) 04/28/2015   MPG 134 (H) 05/21/2014   No results found for: PROLACTIN Lab Results  Component Value Date   CHOL 195 04/28/2015   TRIG 188 (H) 04/28/2015   HDL 43 (L) 04/28/2015   CHOLHDL 4.5 04/28/2015   VLDL 38 (H) 04/28/2015   LDLCALC 114 04/28/2015   LDLCALC 114 04/28/2015     Current Medications: Current Outpatient Medications  Medication Sig Dispense Refill  . acetaminophen (TYLENOL) 325 MG tablet Take 650 mg by mouth  every 6 (six) hours as needed (pain.).    Marland Kitchen amLODipine (NORVASC) 5 MG tablet TAKE ONE TABLET EACH DAY 90 tablet 1  . buPROPion (WELLBUTRIN XL) 150 MG 24 hr tablet Take 1 tablet (150 mg total) by mouth every morning. 30 tablet 2  . divalproex (DEPAKOTE ER) 500 MG 24 hr tablet 2  qam (Patient taking differently: Take 1,000 mg by mouth daily. ) 60 tablet 5  . donepezil (ARICEPT)  10 MG tablet 1  qhs (Patient taking differently: Take 10 mg by mouth at bedtime. ) 30 tablet 8  . fluticasone (FLONASE) 50 MCG/ACT nasal spray Place 2 sprays into both nostrils daily.    . furosemide (LASIX) 20 MG tablet Take 1 tablet (20 mg total) by mouth daily. 30 tablet 0  . gabapentin (NEURONTIN) 100 MG capsule TAKE ONE CAPSULE TWICE A DAY (Patient taking differently: Take 100 mg by mouth 2 (two) times daily. ) 180 capsule 2  . ipratropium (ATROVENT) 0.03 % nasal spray Place 2 sprays into both nostrils every 12 (twelve) hours.    Marland Kitchen levalbuterol (XOPENEX) 0.63 MG/3ML nebulizer solution Take 3 mLs (0.63 mg total) by nebulization every 4 (four) hours as needed for wheezing or shortness of breath. 360 mL 5  . levothyroxine (SYNTHROID, LEVOTHROID) 125 MCG tablet TAKE ONE TABLET DAILY BEFORE BREAKFAST (Patient taking differently: Take 125 mcg by mouth at bedtime. ) 90 tablet 0  . lisinopril (PRINIVIL,ZESTRIL) 10 MG tablet Take 10 mg by mouth daily.    Marland Kitchen lisinopril (PRINIVIL,ZESTRIL) 5 MG tablet TAKE ONE TABLET EACH DAY 30 tablet 5  . memantine (NAMENDA) 5 MG tablet 1  bid (Patient taking differently: Take 5 mg by mouth 2 (two) times daily. ) 60 tablet 4  . oxyCODONE-acetaminophen (PERCOCET/ROXICET) 5-325 MG tablet Take 1-2 tablets by mouth every 3 (three) hours as needed for moderate pain or severe pain. 30 tablet 0  . pantoprazole (PROTONIX) 40 MG tablet TAKE ONE TABLET DAILY (Patient taking differently: Take 40 mg by mouth daily. ) 90 tablet 0  . propranolol (INDERAL) 80 MG tablet TAKE ONE-HALF TABLET 3 TIMES DAILY (Patient taking differently: Take 40 mg by mouth 3 (three) times daily. ) 135 tablet 1  . sodium chloride 1 g tablet TAKE ONE TABLET THREE TIMES DAILY (Patient taking differently: Take 1 g by mouth 3 (three) times daily. ) 180 tablet 0  . temazepam (RESTORIL) 30 MG capsule Take 30 mg by mouth at bedtime.    Marland Kitchen umeclidinium-vilanterol (ANORO ELLIPTA) 62.5-25 MCG/INH AEPB Inhale 1 puff into  the lungs daily. 60 each 5   Current Facility-Administered Medications  Medication Dose Route Frequency Provider Last Rate Last Dose  . umeclidinium-vilanterol (ANORO ELLIPTA) 62.5-25 MCG/INH 1 puff  1 puff Inhalation Daily Chesley Mires, MD        Neurologic: Headache: No Seizure: No Paresthesias:No  Musculoskeletal: Strength & Muscle Tone: decreased Gait & Station: unsteady Patient leans: N/A  Psychiatric Specialty Exam: ROS  Blood pressure (!) 153/77, pulse 60, temperature (!) 97.5 F (36.4 C), height 5\' 6"  (1.676 m), weight 189 lb (85.7 kg).Body mass index is 30.51 kg/m.  General Appearance: Casual  Eye Contact:  Good  Speech:  Clear and Coherent  Volume:  Normal  Mood:  NA  Affect:  depressed  Thought Process:  Goal Directed  Orientation:  Full (Time, Place, and Person)  Thought Content:  WDL  Suicidal Thoughts:  No  Homicidal Thoughts:  No  Memory:  Immediate;   Poor  Judgement:  Fair  Insight:  Lacking  Psychomotor Activity:  Normal  Concentration:    Recall:  Poor  Fund of Knowledge:Poor  Language: Fair  Akathisia:  No  Handed:  Right  AIMS (if indicated):   Assets:  Communication Skills  ADL's:  Impaired  Cognition: Impaired,  Moderate  Sleep:      Treatment Plan Summary: This patient is #1 problem is that of clinical depression.  She is taking Wellbutrin 300 mg and will continue taking that.  Her second problem has to do with a memory loss.  At this time we will go ahead and order an MRI of her brain.  Note is that she is already had a blood work-up for dementia.  I am not exactly sure what the underlying etiology is of her memory loss.  When her next visit she will start off with a Mini-Mental status exam and will take a close look at that.  The etiology of her memory loss could be related to chronic depression.  That is the case I suspect that I will be increasing her Wellbutrin in the near future.  Her third problem is that of insomnia she takes Restoril  for this and does well.  Her fourth problem would have been agitation and she takes Depakote for that.  This seems to be fairly well controlled at this time.  The patient is not suicidal.  At this time the patient is not taking the full dose of Namenda and will talk about that at her next visit.

## 2018-06-14 DIAGNOSIS — H903 Sensorineural hearing loss, bilateral: Secondary | ICD-10-CM | POA: Diagnosis not present

## 2018-06-14 DIAGNOSIS — H6691 Otitis media, unspecified, right ear: Secondary | ICD-10-CM | POA: Diagnosis not present

## 2018-06-14 DIAGNOSIS — H9313 Tinnitus, bilateral: Secondary | ICD-10-CM | POA: Diagnosis not present

## 2018-06-25 ENCOUNTER — Other Ambulatory Visit: Payer: Self-pay | Admitting: Family Medicine

## 2018-06-25 DIAGNOSIS — R600 Localized edema: Secondary | ICD-10-CM

## 2018-06-26 DIAGNOSIS — Z4542 Encounter for adjustment and management of neuropacemaker (brain) (peripheral nerve) (spinal cord): Secondary | ICD-10-CM | POA: Diagnosis not present

## 2018-06-28 ENCOUNTER — Other Ambulatory Visit: Payer: Self-pay | Admitting: Family Medicine

## 2018-06-28 NOTE — Telephone Encounter (Signed)
Please Advise

## 2018-06-28 NOTE — Telephone Encounter (Signed)
No longer my patient   [place]

## 2018-07-09 ENCOUNTER — Other Ambulatory Visit: Payer: Self-pay | Admitting: Family Medicine

## 2018-07-23 ENCOUNTER — Encounter: Payer: Self-pay | Admitting: Nurse Practitioner

## 2018-07-23 ENCOUNTER — Ambulatory Visit (INDEPENDENT_AMBULATORY_CARE_PROVIDER_SITE_OTHER): Payer: Medicare Other | Admitting: Nurse Practitioner

## 2018-07-23 ENCOUNTER — Ambulatory Visit: Payer: Self-pay | Admitting: Emergency Medicine

## 2018-07-23 ENCOUNTER — Other Ambulatory Visit: Payer: Self-pay

## 2018-07-23 ENCOUNTER — Other Ambulatory Visit: Payer: Self-pay | Admitting: General Surgery

## 2018-07-23 VITALS — BP 132/78 | HR 62 | Temp 98.7°F | Ht 66.0 in | Wt 195.8 lb

## 2018-07-23 DIAGNOSIS — J310 Chronic rhinitis: Secondary | ICD-10-CM

## 2018-07-23 DIAGNOSIS — J449 Chronic obstructive pulmonary disease, unspecified: Secondary | ICD-10-CM | POA: Diagnosis not present

## 2018-07-23 MED ORDER — TIOTROPIUM BROMIDE-OLODATEROL 2.5-2.5 MCG/ACT IN AERS: 2.0000 | INHALATION_SPRAY | Freq: Every day | RESPIRATORY_TRACT | 0 refills | Status: DC

## 2018-07-23 NOTE — Progress Notes (Signed)
@Patient  ID: Destiny Hughes, female    DOB: 29-Jun-1945, 73 y.o.   MRN: [id number]  Chief Complaint  Patient presents with  . Follow-up    2 month follow up - COPD, Chronic Cough    Referring provider: Billie Ruddy, MD  HPI  73 year old former smoker with a history of depression, dementia, GERD, hyperlipidemia, hypothyroidism, chronic pain with a implanted nerve stimulator, A Fib s/p cardioversion last year.  She is been followed by Dr. Ashok Cordia in our office for very severe COPD with emphysematous change.  FEV1 0.75 L (35% predicted) 08/27/2015. Currently followed by Dr. [name].   Tests: CXR 02/15/19 - Hyperinflated lungs.  Scarring at the lung bases. CT chest 07/10/16 - Chronic pleural and parenchymal scarring at the right lateral lung base with no acute lung abnormality seen. Stable mild changes of COPD and chronic bronchitis.  PFT: PFT Results Latest Ref Rng & Units 08/27/2015 12/26/2014  FVC-Pre L 1.09 1.23  FVC-Predicted Pre % 34 38  Pre FEV1/FVC % % 69 64  FEV1-Pre L 0.75 0.78  FEV1-Predicted Pre % 31 32  DLCO UNC% % 42 51  DLCO COR %Predicted % 60 71  TLC L - 4.96  TLC % Predicted % - 92  RV % Predicted % - 128    OV 07/23/18 - follow up Patient has a follow-up visit today.  She was last seen by Dr. [name] on 05/27/2018.  She was prescribed Anoro for a trial at that visit for COPD.  She states that she has not had much improvement with this medication.  She still complains of shortness of breath with exertion.  Patient has tried Brynda Peon, and [employer]. The only inhaler that has shown relief was Stolto. Unfortunately patient is having issues affording the medication.  Patient has chronic sinus congestion and nasal drainage.  At her last visit with Dr. [name] it was discussed there would be beneficial for her to see ENT in regards to this possibly contributing to her cough.  She states that she did not get a referral placed to ENT has not seen them yet.  It was also discussed  at her last visit that she should contact her PCP to discuss possibility of changing lisinopril Prialt to an alternative medication.  Patient has failed to do this as of yet. Denies f/c/s, n/v/d, hemoptysis, PND, leg swelling.    Allergies  Allergen Reactions  . Albuterol Shortness Of Breath and Other (See Comments)  . Penicillins Anaphylaxis, Swelling and Other (See Comments)    Has patient had a PCN reaction causing immediate rash, facial/tongue/throat swelling, SOB or lightheadedness with hypotension: Yes Has patient had a PCN reaction causing severe rash involving mucus membranes or skin necrosis: Rash Has patient had a PCN reaction that required hospitalization: No Has patient had a PCN reaction occurring within the last 10 years: No If all of the above answers are "NO", then may proceed with Cephalosporin use.  . Sulfa Antibiotics Swelling and Other (See Comments)    Throat swells, but no shortness of breath noted  . Codeine Nausea And Vomiting  . Ecotrin [Aspirin] Nausea And Vomiting  . Zanaflex [Tizanidine] Other (See Comments)    Possible confusion    Immunization History  Administered Date(s) Administered  . Influenza, High Dose Seasonal PF 04/13/2017, 12/05/2017  . Pneumococcal Conjugate-13 10/03/2014  . Pneumococcal Polysaccharide-23 10/18/2011  . Td 03/21/2010    Past Medical History:  Diagnosis Date  . Arthritis    OA- knees &  back  neck  . Chronic back pain   . Cognitive retention disorder   . Collapse of right lung   . COPD (chronic obstructive pulmonary disease) (HCC)    Hx. Bronchitis occ, 01-25-11 somes issue now taking  Z-pak-started 01-24-11/Scar tissue  present  from previous lung collapse  . Dementia with psychosis ([place])   . Depression   . Depression   . Dyspnea   . Dysrhythmia 2018   treated with Ablation   . GERD (gastroesophageal reflux disease)    tx. TUMS  . Glaucoma   . Headache   . History of kidney stones    surgically treated  .  Hyperlipemia   . Hypertension   . Hypothyroidism    tx. Levothyroxine  . Major depression with psychotic features ([place])   . Nephrolithiasis   . Neuromuscular disorder ([place]) 01-25-11   Pain/nerve stimulator implanted -2 yrs ago.  Marland Kitchen Reflex, gag, absent   . Reflex, gag, absent     Tobacco History: Social History   Tobacco Use  Smoking Status Former Smoker  . Packs/day: 0.75  . Years: 50.00  . Pack years: 37.50  . Types: Cigarettes  . Start date: 09/13/1961  . Last attempt to quit: 05/2016  . Years since quitting: 2.1  Smokeless Tobacco Never Used  Tobacco Comment   Decreased Down to 2/3 a Day if Thinking Abouit It   Counseling given: Not Answered Comment: Decreased Down to 2/3 a Day if Thinking Abouit It   Outpatient Encounter Medications as of 07/23/2018  Medication Sig  . acetaminophen (TYLENOL) 325 MG tablet Take 650 mg by mouth every 6 (six) hours as needed (pain.).  Marland Kitchen amLODipine (NORVASC) 5 MG tablet TAKE ONE TABLET EACH DAY  . buPROPion (WELLBUTRIN XL) 150 MG 24 hr tablet Take 1 tablet (150 mg total) by mouth every morning.  . divalproex (DEPAKOTE ER) 500 MG 24 hr tablet 2  qam (Patient taking differently: Take 1,000 mg by mouth daily. )  . donepezil (ARICEPT) 10 MG tablet 1  qhs (Patient taking differently: Take 10 mg by mouth at bedtime. )  . fluticasone (FLONASE) 50 MCG/ACT nasal spray 1 OR 2 SPRAYS IN NOSE ONCE A DAY  . furosemide (LASIX) 20 MG tablet TAKE ONE TABLET EACH DAY  . gabapentin (NEURONTIN) 100 MG capsule TAKE ONE CAPSULE TWICE A DAY (Patient taking differently: Take 100 mg by mouth 2 (two) times daily. )  . ipratropium (ATROVENT) 0.03 % nasal spray Place 2 sprays into both nostrils every 12 (twelve) hours.  Marland Kitchen levalbuterol (XOPENEX) 0.63 MG/3ML nebulizer solution Take 3 mLs (0.63 mg total) by nebulization every 4 (four) hours as needed for wheezing or shortness of breath.  . levothyroxine (SYNTHROID) 125 MCG tablet Take 1 tablet (125 mcg total) by mouth at  bedtime.  Marland Kitchen lisinopril (PRINIVIL,ZESTRIL) 10 MG tablet Take 10 mg by mouth daily.  Marland Kitchen lisinopril (PRINIVIL,ZESTRIL) 5 MG tablet TAKE ONE TABLET EACH DAY  . memantine (NAMENDA) 5 MG tablet 1  bid (Patient taking differently: Take 5 mg by mouth 2 (two) times daily. )  . oxyCODONE-acetaminophen (PERCOCET/ROXICET) 5-325 MG tablet Take 1-2 tablets by mouth every 3 (three) hours as needed for moderate pain or severe pain.  . pantoprazole (PROTONIX) 40 MG tablet TAKE ONE TABLET DAILY (Patient taking differently: Take 40 mg by mouth daily. )  . propranolol (INDERAL) 80 MG tablet TAKE ONE-HALF TABLET 3 TIMES DAILY (Patient taking differently: Take 40 mg by mouth 3 (three) times daily. )  .  sodium chloride 1 g tablet TAKE ONE TABLET THREE TIMES DAILY  . temazepam (RESTORIL) 30 MG capsule Take 30 mg by mouth at bedtime.  . Tiotropium Bromide-Olodaterol (STIOLTO RESPIMAT) 2.5-2.5 MCG/ACT AERS Inhale 2 puffs into the lungs daily.  . [DISCONTINUED] umeclidinium-vilanterol (ANORO ELLIPTA) 62.5-25 MCG/INH AEPB Inhale 1 puff into the lungs daily. (Patient not taking: Reported on 07/23/2018)   Facility-Administered Encounter Medications as of 07/23/2018  Medication  . umeclidinium-vilanterol (ANORO ELLIPTA) 62.5-25 MCG/INH 1 puff     Review of Systems  Review of Systems  Constitutional: Negative.  Negative for chills and fever.  HENT: Negative.   Respiratory: Positive for shortness of breath. Negative for cough and wheezing.   Cardiovascular: Negative.  Negative for chest pain, palpitations and leg swelling.  Gastrointestinal: Negative.   Allergic/Immunologic: Negative.   Neurological: Negative.   Psychiatric/Behavioral: Negative.        Physical Exam  BP 132/78 (BP Location: Left Arm, Patient Position: Sitting, Cuff Size: Normal)   Pulse 62   Temp 98.7 F (37.1 C)   Ht 5\' 6"  (1.676 m)   Wt 195 lb 12.8 oz (88.8 kg)   SpO2 93%   BMI 31.60 kg/m   Wt Readings from Last 5 Encounters:  07/23/18 195  lb 12.8 oz (88.8 kg)  06/04/18 191 lb (86.6 kg)  05/31/18 190 lb (86.2 kg)  05/21/18 190 lb (86.2 kg)  05/14/18 187 lb (84.8 kg)     Physical Exam Vitals signs and nursing note reviewed.  Constitutional:      General: She is not in acute distress.    Appearance: She is well-developed.  Cardiovascular:     Rate and Rhythm: Normal rate and regular rhythm.  Pulmonary:     Effort: Pulmonary effort is normal. No respiratory distress.     Breath sounds: Normal breath sounds. No wheezing or rhonchi.  Musculoskeletal:        General: No swelling.  Neurological:     Mental Status: She is alert and oriented to person, place, and time.       Assessment & Plan:   COPD (chronic obstructive pulmonary disease) ([place]) Patient has a follow-up visit today.  She has tried Anoro and states that it does not help her.  She still struggles with shortness of breath with exertion.  Patient has tried Brynda Peon, and [employer].  She has had good results with Stiolto and we will give samples of this today.  Hopefully we can get a prior auth for this medication so that she can afford it.   Patient Instructions  We have tried Anoro 1 inhalation once daily to see if it would help your breathing and if you can tolerate it with regard to your cough. Since you could not tolerate medication we will give you samples of stiolto 2 puffs daily and try to get it approved through your insurance.  Please continue your nasal sprays as ordered, Zyrtec as ordered.   Will place referral for ENT regarding your continued nasal drainage.  This is likely contributing to your cough. Please continue your Protonix.  Breakthrough reflux could also contribute to cough. We suspect that your lisinopril is also contributing to your cough.  Please discuss with Dr. Volanda Napoleon the possibility of changing this to an alternative medication such as valsartan.  Follow with Dr. [name] in 4 months or sooner if you have any problems.         Fenton Foy, NP 07/23/2018

## 2018-07-23 NOTE — Assessment & Plan Note (Signed)
Patient has a follow-up visit today.  She has tried Anoro and states that it does not help her.  She still struggles with shortness of breath with exertion.  Patient has tried Brynda Peon, and [employer].  She has had good results with Stiolto and we will give samples of this today.  Hopefully we can get a prior auth for this medication so that she can afford it.   Patient Instructions  We have tried Anoro 1 inhalation once daily to see if it would help your breathing and if you can tolerate it with regard to your cough. Since you could not tolerate medication we will give you samples of stiolto 2 puffs daily and try to get it approved through your insurance.  Please continue your nasal sprays as ordered, Zyrtec as ordered.   Will place referral for ENT regarding your continued nasal drainage.  This is likely contributing to your cough. Please continue your Protonix.  Breakthrough reflux could also contribute to cough. We suspect that your lisinopril is also contributing to your cough.  Please discuss with Dr. Volanda Napoleon the possibility of changing this to an alternative medication such as valsartan.  Follow with Dr. [name] in 4 months or sooner if you have any problems.

## 2018-07-23 NOTE — Patient Instructions (Addendum)
We have tried Anoro 1 inhalation once daily to see if it would help your breathing and if you can tolerate it with regard to your cough. Since you could not tolerate medication we will give you samples of stiolto 2 puffs daily and try to get it approved through your insurance.  Please continue your nasal sprays as ordered, Zyrtec as ordered.  Will place referral for ENT regarding your continued nasal drainage.  This is likely contributing to your cough. Please continue your Protonix.  Breakthrough reflux could also contribute to cough. We suspect that your lisinopril is also contributing to your cough.  Please discuss with Dr. Volanda Napoleon the possibility of changing this to an alternative medication such as valsartan.  Follow with Dr. [name] in 4 months or sooner if you have any problems.

## 2018-07-24 ENCOUNTER — Encounter: Payer: Self-pay | Admitting: Family Medicine

## 2018-07-28 ENCOUNTER — Encounter: Payer: Self-pay | Admitting: Family Medicine

## 2018-07-29 ENCOUNTER — Other Ambulatory Visit: Payer: Self-pay | Admitting: Family Medicine

## 2018-07-29 DIAGNOSIS — R600 Localized edema: Secondary | ICD-10-CM

## 2018-08-07 ENCOUNTER — Other Ambulatory Visit (HOSPITAL_COMMUNITY): Payer: Self-pay

## 2018-08-07 DIAGNOSIS — R413 Other amnesia: Secondary | ICD-10-CM

## 2018-08-08 ENCOUNTER — Other Ambulatory Visit: Payer: Self-pay

## 2018-08-08 ENCOUNTER — Ambulatory Visit (INDEPENDENT_AMBULATORY_CARE_PROVIDER_SITE_OTHER): Payer: Medicare Other | Admitting: Psychiatry

## 2018-08-08 DIAGNOSIS — F0150 Vascular dementia without behavioral disturbance: Secondary | ICD-10-CM

## 2018-08-08 DIAGNOSIS — F329 Major depressive disorder, single episode, unspecified: Secondary | ICD-10-CM

## 2018-08-08 DIAGNOSIS — F0153 Vascular dementia, unspecified severity, with mood disturbance: Secondary | ICD-10-CM

## 2018-08-08 MED ORDER — DIVALPROEX SODIUM ER 500 MG PO TB24: 1000.0000 mg | ORAL_TABLET | Freq: Every day | ORAL | 4 refills | Status: DC

## 2018-08-08 MED ORDER — BUPROPION HCL ER (XL) 150 MG PO TB24: 150.0000 mg | ORAL_TABLET | ORAL | 2 refills | Status: DC

## 2018-08-08 MED ORDER — TEMAZEPAM 30 MG PO CAPS: 30.0000 mg | ORAL_CAPSULE | Freq: Every day | ORAL | 4 refills | Status: DC

## 2018-08-08 MED ORDER — DONEPEZIL HCL 10 MG PO TABS: 10.0000 mg | ORAL_TABLET | Freq: Every day | ORAL | 5 refills | Status: DC

## 2018-08-08 NOTE — Progress Notes (Signed)
Psychiatric Initial Adult Assessment   Patient Identification: Destiny Hughes MRN:  [id number] Date of Evaluation:  08/08/2018 Referral Source: [facility] Chief Complaint: Depression  Today this patient was interviewed with her husband.  She is being treated here for dementia with depression.  Over the last 6 months we have taken away much of her medicines and the patient has remarkably improved in terms of her cognitive ability.  Today she is talkative friendly engaging.  Her thoughts are clear connected and organized.  She expresses herself very well.  Typically she is quiet withdrawn appears depressed and cannot speak clearly.  Today she articulates words tells me exactly how she feels and what she is doing.  She is dramatically improved taking Wellbutrin and Restoril.  She still takes Depakote.  She demonstrated no agitation at all.  Apparently in an effort to get an MRI it required her to take a TENS unit out of her body.  For whatever reason she is much better without the TENS unit and now is ready to get an MRI.  We initially wanted the MRI to do a dementia evaluation.  At this time the patient is completely oriented.  It is hard to detect any specific cognitive problems but at this time it is reasonable to go ahead and get the MRI.  He is sleeping and eating well.  She is got good energy.  Her thoughts are clear and organized.  She is not suicidal.  She drinks no alcohol uses no drugs.  The patient denies chest pain shortness of breath or any respiratory symptoms at all.  Physically she is very well and emotionally she is quite stable. (Hypo) Manic Symptoms:   Anxiety Symptoms:   Psychotic Symptoms:   PTSD Symptoms:   Past Psychiatric History: none  Previous Psychotropic Medications: Yes   Substance Abuse History in the last 12 months:  No.  Consequences of Substance Abuse:   Past Medical History:  Past Medical History:  Diagnosis Date  . Arthritis    OA- knees  & back  neck  . Chronic back pain   . Cognitive retention disorder   . Collapse of right lung   . COPD (chronic obstructive pulmonary disease) (HCC)    Hx. Bronchitis occ, 01-25-11 somes issue now taking  Z-pak-started 01-24-11/Scar tissue  present  from previous lung collapse  . Dementia with psychosis ([place])   . Depression   . Depression   . Dyspnea   . Dysrhythmia 2018   treated with Ablation   . GERD (gastroesophageal reflux disease)    tx. TUMS  . Glaucoma   . Headache   . History of kidney stones    surgically treated  . Hyperlipemia   . Hypertension   . Hypothyroidism    tx. Levothyroxine  . Major depression with psychotic features ([place])   . Nephrolithiasis   . Neuromuscular disorder ([place]) 01-25-11   Pain/nerve stimulator implanted -2 yrs ago.  Marland Kitchen Reflex, gag, absent   . Reflex, gag, absent     Past Surgical History:  Procedure Laterality Date  . A-FLUTTER ABLATION N/A 08/16/2016   Procedure: A-Flutter Ablation;  Surgeon: Evans Lance, MD;  Location: [facility];  Service: Cardiovascular;  Laterality: N/A;  . ANTERIOR CERVICAL DECOMP/DISCECTOMY FUSION     3 levels, per family  . APPENDECTOMY  01-25-11  . CARPAL TUNNEL RELEASE  01-25-11   Bil.  . CERVICAL SPINE SURGERY  01-25-11   2005-multiple levels  .  CHOLECYSTECTOMY  01-25-11   '07  . COLONOSCOPY WITH PROPOFOL N/A 07/23/2012   Procedure: COLONOSCOPY WITH PROPOFOL;  Surgeon: Garlan Fair, MD;  Location: [facility];  Service: Endoscopy;  Laterality: N/A;  . detatched retna Bilateral    done July & August- 2019  . EYE SURGERY Bilateral 2019   repair of retina detachment- Dr. Zadie Rhine at surgical center   . JOINT REPLACEMENT  01-25-11   6'03 LTHA-hemi  . KNEE ARTHROPLASTY  01-25-11   '04-right, revised x2  . no gag reflex     Pt does not havea gag reflex following  siurgery to neck. Family unsure of date. Pt takes pill in apple sauce.  . SPINAL CORD STIMULATOR BATTERY EXCHANGE N/A 01/12/2018   Procedure:  Spinal cord stimulator battery replacement;  Surgeon: Clydell Hakim, MD;  Location: [facility];  Service: Neurosurgery;  Laterality: N/A;  left  . SPINAL CORD STIMULATOR IMPLANT  2009 APPROX   DR. ELSNER  . SPINAL CORD STIMULATOR REMOVAL N/A 06/04/2018   Procedure: Removal of spinal cord stimulator, thoracic paddle and generator;  Surgeon: Kristeen Miss, MD;  Location: [facility];  Service: Neurosurgery;  Laterality: N/A;  . THORACIC SPINE SURGERY  01-25-11   6'02- then nerve stimulator implanted after  . TOTAL KNEE REVISION  02/01/2011   Procedure: TOTAL KNEE REVISION;  Surgeon: Mauri Pole;  [facility]: [facility];  Service: Orthopedics;  Laterality: Right;  . TUBAL LIGATION      Family Psychiatric History:   Family History:  Family History  Problem Relation Age of Onset  . Heart attack Father   . Emphysema Father   . Aneurysm Mother        CEREBRAL  . Emphysema Mother   . Dementia Mother   . Diabetes Son   . Hypertension Son   . Hypertension Son   . Cancer Sister        Widely Metastatic  . Asthma Son        x2  . Breast cancer Paternal Aunt   . Rheum arthritis Maternal Aunt     Social History:   Social History   Socioeconomic History  . Marital status: Married    Spouse name: Not on file  . Number of children: 2  . Years of education: Not on file  . Highest education level: Not on file  Occupational History  . Occupation: disabled  Social Needs  . Financial resource strain: Not on file  . Food insecurity:    Worry: Not on file    Inability: Not on file  . Transportation needs:    Medical: Not on file    Non-medical: Not on file  Tobacco Use  . Smoking status: Former Smoker    Packs/day: 0.75    Years: 50.00    Pack years: 37.50    Types: Cigarettes    Start date: 09/13/1961    Last attempt to quit: 05/2016    Years since quitting: 2.2  . Smokeless tobacco: Never Used  . Tobacco comment: Decreased Down to 2/3 a Day if Thinking Abouit It  Substance and Sexual  Activity  . Alcohol use: No    Alcohol/week: 0.0 standard drinks    Comment: none in 10 years  . Drug use: No  . Sexual activity: Never  Lifestyle  . Physical activity:    Days per week: Not on file    Minutes per session: Not on file  . Stress: Not on file  Relationships  . Social connections:  Talks on phone: Not on file    Gets together: Not on file    Attends religious service: Not on file    Active member of club or organization: Not on file    Attends meetings of clubs or organizations: Not on file    Relationship status: Not on file  Other Topics Concern  . Not on file  Social History Narrative   She is from [place] [facility]. She has always lived in [place]. She has traveled to [place], [place], [place], [place], & [place]. 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 Prior exposure to parakeets with last exposure remote in her current home. Previously had mold in her home that was in her bathroom & fixed. Prior exposure to hot tubs but none recently. Pt lives at home with [name], husband.  Has 2 childrean, 12th grade education.      Additional Social History:   Allergies:   Allergies  Allergen Reactions  . Albuterol Shortness Of Breath and Other (See Comments)  . Penicillins Anaphylaxis, Swelling and Other (See Comments)    Has patient had a PCN reaction causing immediate rash, facial/tongue/throat swelling, SOB or lightheadedness with hypotension: Yes Has patient had a PCN reaction causing severe rash involving mucus membranes or skin necrosis: Rash Has patient had a PCN reaction that required hospitalization: No Has patient had a PCN reaction occurring within the last 10 years: No If all of the above answers are "NO", then may proceed with Cephalosporin use.  . Sulfa Antibiotics Swelling and Other (See Comments)    Throat swells, but no shortness of breath noted  . Codeine Nausea And  Vomiting  . Ecotrin [Aspirin] Nausea And Vomiting  . Zanaflex [Tizanidine] Other (See Comments)    Possible confusion    Metabolic Disorder Labs: Lab Results  Component Value Date   HGBA1C 6.4 (H) 04/28/2015   MPG 137 (H) 04/28/2015   MPG 134 (H) 05/21/2014   No results found for: PROLACTIN Lab Results  Component Value Date   CHOL 195 04/28/2015   TRIG 188 (H) 04/28/2015   HDL 43 (L) 04/28/2015   CHOLHDL 4.5 04/28/2015   VLDL 38 (H) 04/28/2015   LDLCALC 114 04/28/2015   LDLCALC 114 04/28/2015     Current Medications: Current Outpatient Medications  Medication Sig Dispense Refill  . acetaminophen (TYLENOL) 325 MG tablet Take 650 mg by mouth every 6 (six) hours as needed (pain.).    Marland Kitchen amLODipine (NORVASC) 5 MG tablet TAKE ONE TABLET EACH DAY 90 tablet 1  . buPROPion (WELLBUTRIN XL) 150 MG 24 hr tablet Take 1 tablet (150 mg total) by mouth every morning. 30 tablet 2  . divalproex (DEPAKOTE ER) 500 MG 24 hr tablet 2  qam (Patient taking differently: Take 1,000 mg by mouth daily. ) 60 tablet 5  . donepezil (ARICEPT) 10 MG tablet 1  qhs (Patient taking differently: Take 10 mg by mouth at bedtime. ) 30 tablet 8  . fluticasone (FLONASE) 50 MCG/ACT nasal spray 1 OR 2 SPRAYS IN NOSE ONCE A DAY 16 g 5  . furosemide (LASIX) 20 MG tablet TAKE ONE TABLET DAILY 30 tablet 0  . gabapentin (NEURONTIN) 100 MG capsule TAKE ONE CAPSULE TWICE A DAY (Patient taking differently: Take 100 mg by mouth 2 (two) times daily. ) 180 capsule 2  . ipratropium (ATROVENT) 0.03 % nasal spray Place 2 sprays into both  nostrils every 12 (twelve) hours.    Marland Kitchen levalbuterol (XOPENEX) 0.63 MG/3ML nebulizer solution Take 3 mLs (0.63 mg total) by nebulization every 4 (four) hours as needed for wheezing or shortness of breath. 360 mL 5  . levothyroxine (SYNTHROID) 125 MCG tablet Take 1 tablet (125 mcg total) by mouth at bedtime. 90 tablet 0  . lisinopril (PRINIVIL,ZESTRIL) 10 MG tablet Take 10 mg by mouth daily.    Marland Kitchen  lisinopril (PRINIVIL,ZESTRIL) 5 MG tablet TAKE ONE TABLET EACH DAY 30 tablet 5  . memantine (NAMENDA) 5 MG tablet 1  bid (Patient taking differently: Take 5 mg by mouth 2 (two) times daily. ) 60 tablet 4  . oxyCODONE-acetaminophen (PERCOCET/ROXICET) 5-325 MG tablet Take 1-2 tablets by mouth every 3 (three) hours as needed for moderate pain or severe pain. 30 tablet 0  . pantoprazole (PROTONIX) 40 MG tablet TAKE ONE TABLET DAILY (Patient taking differently: Take 40 mg by mouth daily. ) 90 tablet 0  . propranolol (INDERAL) 80 MG tablet TAKE ONE-HALF TABLET 3 TIMES DAILY (Patient taking differently: Take 40 mg by mouth 3 (three) times daily. ) 135 tablet 1  . sodium chloride 1 g tablet TAKE ONE TABLET THREE TIMES DAILY 180 tablet 0  . temazepam (RESTORIL) 30 MG capsule Take 30 mg by mouth at bedtime.    . Tiotropium Bromide-Olodaterol (STIOLTO RESPIMAT) 2.5-2.5 MCG/ACT AERS Inhale 2 puffs into the lungs daily. 2 Inhaler 0   Current Facility-Administered Medications  Medication Dose Route Frequency Provider Last Rate Last Dose  . umeclidinium-vilanterol (ANORO ELLIPTA) 62.5-25 MCG/INH 1 puff  1 puff Inhalation Daily Chesley Mires, MD        Neurologic: Headache: No Seizure: No Paresthesias:No  Musculoskeletal: Strength & Muscle Tone: decreased Gait & Station: unsteady Patient leans: N/A  Psychiatric Specialty Exam: ROS  There were no vitals taken for this visit.There is no height or weight on file to calculate BMI.  General Appearance: Casual  Eye Contact:  Good  Speech:  Clear and Coherent  Volume:  Normal  Mood:  NA  Affect:  depressed  Thought Process:  Goal Directed  Orientation:  Full (Time, Place, and Person)  Thought Content:  WDL  Suicidal Thoughts:  No  Homicidal Thoughts:  No  Memory:  Immediate;   Poor  Judgement:  Fair  Insight:  Lacking  Psychomotor Activity:  Normal  Concentration:    Recall:  Poor  Fund of Knowledge:Poor  Language: Fair  Akathisia:  No   Handed:  Right  AIMS (if indicated):   Assets:  Communication Skills  ADL's:  Impaired  Cognition: Impaired,  Moderate  Sleep:      Treatment Plan Summary: This patient is #1 problem is that of major depression.  I think for a variety of factors she was cognitively impaired possibly from depression.  Now she is on Wellbutrin and her thoughts are clear and organized.  She is cognitively much better.  Also removing much of her psychotropic medications has been beneficial.  Her second problem is that of insomnia.  She takes Restoril regularly and sleeps well.  She is eating well.  She is functioning much better.  She will return to see me in 3 months.  We will go ahead and order the MRI of her brain to rule out any explanations for her cognitive deficit.  We will likely do a Mini-Mental Status exam at her next visit.  For now she will continue taking amantadine and Aricept.  The possibility of reducing his  medicines will be considered on her next visit.  For now she will continue her Wellbutrin and Restoril as ordered.

## 2018-08-10 DIAGNOSIS — J310 Chronic rhinitis: Secondary | ICD-10-CM | POA: Diagnosis not present

## 2018-08-10 DIAGNOSIS — R05 Cough: Secondary | ICD-10-CM | POA: Diagnosis not present

## 2018-08-17 ENCOUNTER — Other Ambulatory Visit: Payer: Self-pay | Admitting: Family Medicine

## 2018-09-04 ENCOUNTER — Other Ambulatory Visit: Payer: Self-pay | Admitting: Family Medicine

## 2018-09-04 DIAGNOSIS — R600 Localized edema: Secondary | ICD-10-CM

## 2018-09-11 ENCOUNTER — Other Ambulatory Visit (HOSPITAL_COMMUNITY): Payer: Self-pay | Admitting: *Deleted

## 2018-09-11 MED ORDER — MEMANTINE HCL 5 MG PO TABS: ORAL_TABLET | ORAL | 0 refills | Status: DC

## 2018-09-17 ENCOUNTER — Other Ambulatory Visit: Payer: Self-pay | Admitting: Cardiovascular Disease

## 2018-09-20 DIAGNOSIS — J310 Chronic rhinitis: Secondary | ICD-10-CM | POA: Diagnosis not present

## 2018-09-20 DIAGNOSIS — J342 Deviated nasal septum: Secondary | ICD-10-CM | POA: Diagnosis not present

## 2018-09-20 DIAGNOSIS — H938X3 Other specified disorders of ear, bilateral: Secondary | ICD-10-CM | POA: Diagnosis not present

## 2018-09-25 ENCOUNTER — Telehealth: Payer: Self-pay | Admitting: Pulmonary Disease

## 2018-09-25 MED ORDER — STIOLTO RESPIMAT 2.5-2.5 MCG/ACT IN AERS: 2.0000 | INHALATION_SPRAY | Freq: Every day | RESPIRATORY_TRACT | 3 refills | Status: DC

## 2018-09-25 NOTE — Telephone Encounter (Signed)
Called and spoke with pt's husband [name] stated that pt was given samples of Stiolto that she is almost out of. Stiolto is not covered by insurance. Before an Rx was sent to pharmacy, [name] was wanting to know if we could send Rx to pharmacy for pt of an inhaler that is a covered alternative covered by insurance that is equivalent to the [employer].   Tried to call insurance company to see if they could provide [place] with a list of covered inhalers but they were unable to help out. Called pt's pharmacy and was told that the Anoro and Charolotte Eke are covered by pt's insurance.  Looking at pt's medication list, pt has tried anoro and bevespi in the past so we should be able to do a PA to get Stiolto approved for pt due to it giving pt relief.  Medication name and strength: Stiolto 2.20mcg Provider: [name] Pharmacy: [facility] Patient insurance ID: [id number] Phone: [phone] Fax: [phone]  Was the PA started on CMM?  yes If yes, please enter the Key: [id number] Timeframe for approval/denial: Your information has been submitted to [place] Medicare Part D. [employer] Part D will review the request and will issue a decision, typically within 1-3 days from your submission. You can check the updated outcome later by reopening this request.  If [employer] has not responded in 1-3 days or if you have any questions about your ePA request, please contact [place] Medicare Part D at [phone]. If you think there may be a problem with your PA request, use our live chat feature at the bottom right.  Will keep encounter open to follow up on PA status.

## 2018-09-25 NOTE — Telephone Encounter (Signed)
Checked covermymeds and saw that the PA for pt's Stiolto had been approved  This request has received a Favorable outcome.  Please note any additional information provided by [employer] Part D at the bottom of this request.  This approval authorizes your coverage from 06/27/2018 - 09/25/2019.  Called pt's pharmacy to let them know that we received approval on the PA we did for pt's Stiolto. Per pharmacy, due to pt having Medicare Part D Coverage, for the Stiolto it has been worked out that pt will have to pay 50% of the copay for the [employer] and insurance will pay the other part. Listed below you can see the costs that pt will have to pay for the Stiolto, Anoro, and Bevespi which are all covered by insurance whether it requires a PA to be done or if it is already a covered off of the formulary list.  [phone] after insurance coverage (50% copay) for [employer] $47 after insurance coverage for [employer] $47 after insurance coverage for [employer] and spoke with pt's husband [name] stating the info I found out in regards to the cost of the [place], Anoro, and the [employer]. Asked [name] if they had ever tried to apply for patient assistance for the [employer] and he said they had not. I stated to [name] that I was going to mail patient assistance application to their address for them to fill out and then we could see if pt could be able to be approved for patient assistance. [name] verbalized understanding.  Application placed in mail for pt. Nothing further needed.

## 2018-09-26 ENCOUNTER — Other Ambulatory Visit (HOSPITAL_COMMUNITY): Payer: Self-pay

## 2018-09-26 MED ORDER — BUPROPION HCL ER (XL) 150 MG PO TB24: 300.0000 mg | ORAL_TABLET | ORAL | 2 refills | Status: DC

## 2018-10-02 NOTE — Telephone Encounter (Signed)
[name] husband [name] dropped off the completed patient assistance application. Application has been faxed to [facility].

## 2018-10-08 ENCOUNTER — Other Ambulatory Visit: Payer: Self-pay

## 2018-10-08 ENCOUNTER — Telehealth: Payer: Self-pay | Admitting: Emergency Medicine

## 2018-10-08 ENCOUNTER — Ambulatory Visit
Admission: RE | Admit: 2018-10-08 | Discharge: 2018-10-08 | Disposition: A | Discharge status: Home / Self Care | Payer: Medicare Other | Source: Ambulatory Visit | Attending: Psychiatry | Admitting: Psychiatry

## 2018-10-08 ENCOUNTER — Other Ambulatory Visit: Payer: Self-pay | Admitting: Family Medicine

## 2018-10-08 DIAGNOSIS — R413 Other amnesia: Secondary | ICD-10-CM

## 2018-10-08 NOTE — Telephone Encounter (Signed)
Called and spoke with Lannette Donath to get an update on pt's application. Per Lannette Donath, she was unable to find pt's application in their system. She stated that they do have a high volume of patients trying to get in. [place] asked if I could refax the application to them at either [phone] or an alternate fax number of [phone].   Called and spoke with pt's husband [name] letting him know that for some reason the fax was not received by [place] [facility] Patient [facility]. Stated to him that we will refax the application to the main fax number for [facility] as well as alternative fax number I was given but stated to him that I noticed a signature that was left off. Stated to him that it would be best for him to come by and sign this page prior to [place] refaxing the application as that could also be a hold up with them processing the application for pt. [name] stated he would try to come by either today if not tomorrow to sign the missing page.  I have placed application up front for pt/husband [name] to sign missing page. Will leave encounter open as application will need to be refaxed to [facility] and we should go ahead and fax it to both fax numbers documented above.

## 2018-10-09 NOTE — Telephone Encounter (Signed)
Pt came by office yesterday 7/20 and signed the one page that was missing a signature.  Paperwork has been refaxed to [employer] at both fax numbers I was given. Will check back later this week to see if we have an update from [facility] and then will update pt.

## 2018-10-15 MED ORDER — ANORO ELLIPTA 62.5-25 MCG/INH IN AEPB: 1.0000 | INHALATION_SPRAY | Freq: Every day | RESPIRATORY_TRACT | 3 refills | Status: DC

## 2018-10-15 NOTE — Telephone Encounter (Signed)
Called and spoke with pt's husband Destiny Hughes letting him know that [facility] said it was fine for pt to go back on the anoro. Stated to him that [facility] said for her to keep track of any response she gets so this can be reevaluated at next OV and Destiny Hughes verbalized understanding. Verified preferred pharmacy and sent Rx in for pt. Nothing further needed.

## 2018-10-15 NOTE — Telephone Encounter (Signed)
Yes I am Ok with going back to [employer]. Have her keep track of any response she gets so we can discuss next OV

## 2018-10-15 NOTE — Telephone Encounter (Signed)
Destiny Hughes husband states received letter from [employer].  Would like to know if he needs to bring letter in to the office.  Destiny Hughes phone number is [phone].

## 2018-10-15 NOTE — Telephone Encounter (Signed)
Called and spoke with pt's husband [name] who stated that pt received a letter from [facility] stating that some info was still missing from application and application could not be processed at this time. Stated to [name] that I would call [employer] to get further info from them and he verbalized understanding.  After speaking with [name] in regards to the application, he then stated to me that we could stop trying to see if we could get pt approved for assistance with the Stiolto as he said that he does not like this inhaler and pt has also mentioned that she does not like the Stiolto. [name] said that once pt finishes the current Stiolto inhaler, pt will be switching back to [employer] which she still has some left of that. Below is info from last OV with [place] 5/4:  Patient was prescribed Anoro for a trial at that visit for COPD. She states that she has not had much improvement with this medication.  She still complains of shortness of breath with exertion. Patient has tried Brynda Peon, and [employer]. The only inhaler that has shown relief was Stolto.   After speaking with [name] again, he verbalized that he does not like the Stiolto inhaler and he said that pt does not really like it either and was going to go back to the Anoro. Dr. [name], please advise on this if you are fine with pt going back to the [facility] or if you have any other options for pt in regards to meds. Thanks!

## 2018-10-17 ENCOUNTER — Other Ambulatory Visit (HOSPITAL_COMMUNITY): Payer: Self-pay

## 2018-10-17 MED ORDER — MEMANTINE HCL 5 MG PO TABS: ORAL_TABLET | ORAL | 0 refills | Status: DC

## 2018-10-21 ENCOUNTER — Other Ambulatory Visit: Payer: Self-pay | Admitting: Family Medicine

## 2018-10-21 DIAGNOSIS — R600 Localized edema: Secondary | ICD-10-CM

## 2018-10-22 ENCOUNTER — Telehealth: Payer: Self-pay | Admitting: Emergency Medicine

## 2018-10-22 NOTE — Telephone Encounter (Signed)
Called and spoke with pt's husband Destiny Hughes said they received boxes of stiolto in the mail but due to pt being switched off of the stiolto, he did not know what needed to be done in regards to the inhaler boxes. I asked Destiny Hughes if a form with phone number had been sent with the inhalers and he said that there was. Stated to him to contact that phone number and tell them that pt was just switched from the stiolto to a different inhaler and due to this, pt no longer needs the boxes of stiolto and ask them what they should do in regards to them (if they need to mail inhalers back to the patient assistance pharmacy). Destiny Hughes verbalized understanding. Nothing further needed.

## 2018-10-31 ENCOUNTER — Other Ambulatory Visit: Payer: Self-pay | Admitting: Family Medicine

## 2018-11-02 ENCOUNTER — Other Ambulatory Visit: Payer: Self-pay

## 2018-11-02 ENCOUNTER — Ambulatory Visit (INDEPENDENT_AMBULATORY_CARE_PROVIDER_SITE_OTHER): Payer: Medicare Other | Admitting: Psychiatry

## 2018-11-02 DIAGNOSIS — G4709 Other insomnia: Secondary | ICD-10-CM | POA: Diagnosis not present

## 2018-11-02 DIAGNOSIS — F0150 Vascular dementia without behavioral disturbance: Secondary | ICD-10-CM

## 2018-11-02 MED ORDER — BUPROPION HCL ER (XL) 150 MG PO TB24: 300.0000 mg | ORAL_TABLET | ORAL | 2 refills | Status: DC

## 2018-11-02 MED ORDER — MEMANTINE HCL 5 MG PO TABS: ORAL_TABLET | ORAL | 0 refills | Status: DC

## 2018-11-02 MED ORDER — TEMAZEPAM 30 MG PO CAPS: 30.0000 mg | ORAL_CAPSULE | Freq: Every day | ORAL | 4 refills | Status: DC

## 2018-11-02 MED ORDER — DONEPEZIL HCL 10 MG PO TABS: 10.0000 mg | ORAL_TABLET | Freq: Every day | ORAL | 5 refills | Status: DC

## 2018-11-02 NOTE — Progress Notes (Signed)
Psychiatric Initial Adult Assessment   Patient Identification: Destiny Hughes MRN:  [id number] Date of Evaluation:  11/02/2018 Referral Source: [facility] Chief Complaint: Major depression remission  Today the patient is doing very well.  She is at her baseline.  Today we reviewed her MRI results demonstrating that there is no significant abnormalities.  She was interviewed with her husband Destiny Hughes.  The patient denies daily depression.  Her husband Destiny Hughes says she is doing very well.  She is sleeping and eating well.  She shows no evidence of psychosis.  She denies chest pain shortness of breath or any physical complaints.  The patient is functioning very well.  She has no real complaints at all.  Cognitively she is very stable.  Her next visit we will do a Mini-Mental Status exam.  The patient takes Wellbutrin on a regular basis.  She is not anhedonic.  She is not suicidal.  Removing much of her psychotropic medications over the last year or 2 has been very beneficial.  I believe Wellbutrin is very helpful.  Patient now is interactive and articulate.   (Hypo) Manic Symptoms:   Anxiety Symptoms:   Psychotic Symptoms:   PTSD Symptoms:   Past Psychiatric History: none  Previous Psychotropic Medications: Yes   Substance Abuse History in the last 12 months:  No.  Consequences of Substance Abuse:   Past Medical History:  Past Medical History:  Diagnosis Date  . Arthritis    OA- knees & back  neck  . Chronic back pain   . Cognitive retention disorder   . Collapse of right lung   . COPD (chronic obstructive pulmonary disease) (HCC)    Hx. Bronchitis occ, 01-25-11 somes issue now taking  Z-pak-started 01-24-11/Scar tissue  present  from previous lung collapse  . Dementia with psychosis ([place])   . Depression   . Depression   . Dyspnea   . Dysrhythmia 2018   treated with Ablation   . GERD (gastroesophageal reflux disease)    tx. TUMS  . Glaucoma   . Headache   .  History of kidney stones    surgically treated  . Hyperlipemia   . Hypertension   . Hypothyroidism    tx. Levothyroxine  . Major depression with psychotic features ([place])   . Nephrolithiasis   . Neuromuscular disorder ([place]) 01-25-11   Pain/nerve stimulator implanted -2 yrs ago.  Marland Kitchen Reflex, gag, absent   . Reflex, gag, absent     Past Surgical History:  Procedure Laterality Date  . A-FLUTTER ABLATION N/A 08/16/2016   Procedure: A-Flutter Ablation;  Surgeon: Evans Lance, MD;  Location: [facility];  Service: Cardiovascular;  Laterality: N/A;  . ANTERIOR CERVICAL DECOMP/DISCECTOMY FUSION     3 levels, per family  . APPENDECTOMY  01-25-11  . CARPAL TUNNEL RELEASE  01-25-11   Bil.  . CERVICAL SPINE SURGERY  01-25-11   2005-multiple levels  . CHOLECYSTECTOMY  01-25-11   '07  . COLONOSCOPY WITH PROPOFOL N/A 07/23/2012   Procedure: COLONOSCOPY WITH PROPOFOL;  Surgeon: Garlan Fair, MD;  [facility];  Service: Endoscopy;  Laterality: N/A;  . detatched retna Bilateral    done July & August- 2019  . EYE SURGERY Bilateral 2019   repair of retina detachment- Dr. Zadie Rhine at surgical center   . JOINT REPLACEMENT  01-25-11   6'03 LTHA-hemi  . KNEE ARTHROPLASTY  01-25-11   '04-right, revised x2  . no gag reflex  Pt does not havea gag reflex following  siurgery to neck. Family unsure of date. Pt takes pill in apple sauce.  . SPINAL CORD STIMULATOR BATTERY EXCHANGE N/A 01/12/2018   Procedure: Spinal cord stimulator battery replacement;  Surgeon: Clydell Hakim, MD;  Location: [facility];  Service: Neurosurgery;  Laterality: N/A;  left  . SPINAL CORD STIMULATOR IMPLANT  2009 APPROX   DR. ELSNER  . SPINAL CORD STIMULATOR REMOVAL N/A 06/04/2018   Procedure: Removal of spinal cord stimulator, thoracic paddle and generator;  Surgeon: Kristeen Miss, MD;  Location: [facility];  Service: Neurosurgery;  Laterality: N/A;  . THORACIC SPINE SURGERY  01-25-11   6'02- then nerve stimulator implanted  after  . TOTAL KNEE REVISION  02/01/2011   Procedure: TOTAL KNEE REVISION;  Surgeon: Mauri Pole;  [facility];  Service: Orthopedics;  Laterality: Right;  . TUBAL LIGATION      Family Psychiatric History:   Family History:  Family History  Problem Relation Age of Onset  . Heart attack Father   . Emphysema Father   . Aneurysm Mother        CEREBRAL  . Emphysema Mother   . Dementia Mother   . Diabetes Son   . Hypertension Son   . Hypertension Son   . Cancer Sister        Widely Metastatic  . Asthma Son        x2  . Breast cancer Paternal Aunt   . Rheum arthritis Maternal Aunt     Social History:   Social History   Socioeconomic History  . Marital status: Married    Spouse name: Not on file  . Number of children: 2  . Years of education: Not on file  . Highest education level: Not on file  Occupational History  . Occupation: disabled  Social Needs  . Financial resource strain: Not on file  . Food insecurity    Worry: Not on file    Inability: Not on file  . Transportation needs    Medical: Not on file    Non-medical: Not on file  Tobacco Use  . Smoking status: Former Smoker    Packs/day: 0.75    Years: 50.00    Pack years: 37.50    Types: Cigarettes    Start date: 09/13/1961    Quit date: 05/2016    Years since quitting: 2.4  . Smokeless tobacco: Never Used  . Tobacco comment: Decreased Down to 2/3 a Day if Thinking Abouit It  Substance and Sexual Activity  . Alcohol use: No    Alcohol/week: 0.0 standard drinks    Comment: none in 10 years  . Drug use: No  . Sexual activity: Never  Lifestyle  . Physical activity    Days per week: Not on file    Minutes per session: Not on file  . Stress: Not on file  Relationships  . Social Herbalist on phone: Not on file    Gets together: Not on file    Attends religious service: Not on file    Active member of club or organization: Not on file    Attends meetings of clubs or organizations:  Not on file    Relationship status: Not on file  Other Topics Concern  . Not on file  Social History Narrative   She is from [facility]. She has always lived in [place]. She has traveled to [place], [place], [place], [place], & [place]. 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 Prior exposure to parakeets with last exposure remote in her current home. Previously had mold in her home that was in her bathroom & fixed. Prior exposure to hot tubs but none recently. Pt lives at home with [name], husband.  Has 2 childrean, 12th grade education.      Additional Social History:   Allergies:   Allergies  Allergen Reactions  . Albuterol Shortness Of Breath and Other (See Comments)  . Penicillins Anaphylaxis, Swelling and Other (See Comments)    Has patient had a PCN reaction causing immediate rash, facial/tongue/throat swelling, SOB or lightheadedness with hypotension: Yes Has patient had a PCN reaction causing severe rash involving mucus membranes or skin necrosis: Rash Has patient had a PCN reaction that required hospitalization: No Has patient had a PCN reaction occurring within the last 10 years: No If all of the above answers are "NO", then may proceed with Cephalosporin use.  . Sulfa Antibiotics Swelling and Other (See Comments)    Throat swells, but no shortness of breath noted  . Codeine Nausea And Vomiting  . Ecotrin [Aspirin] Nausea And Vomiting  . Zanaflex [Tizanidine] Other (See Comments)    Possible confusion    Metabolic Disorder Labs: Lab Results  Component Value Date   HGBA1C 6.4 (H) 04/28/2015   MPG 137 (H) 04/28/2015   MPG 134 (H) 05/21/2014   No results found for: PROLACTIN Lab Results  Component Value Date   CHOL 195 04/28/2015   TRIG 188 (H) 04/28/2015   HDL 43 (L) 04/28/2015   CHOLHDL 4.5 04/28/2015   VLDL 38 (H) 04/28/2015   LDLCALC 114 04/28/2015   LDLCALC 114 04/28/2015      Current Medications: Current Outpatient Medications  Medication Sig Dispense Refill  . acetaminophen (TYLENOL) 325 MG tablet Take 650 mg by mouth every 6 (six) hours as needed (pain.).    Marland Kitchen amLODipine (NORVASC) 5 MG tablet TAKE ONE TABLET EACH DAY 90 tablet 1  . buPROPion (WELLBUTRIN XL) 150 MG 24 hr tablet Take 2 tablets (300 mg total) by mouth every morning. 60 tablet 2  . divalproex (DEPAKOTE ER) 500 MG 24 hr tablet Take 2 tablets (1,000 mg total) by mouth daily. 60 tablet 4  . donepezil (ARICEPT) 10 MG tablet Take 1 tablet (10 mg total) by mouth at bedtime. 30 tablet 5  . fluticasone (FLONASE) 50 MCG/ACT nasal spray 1 OR 2 SPRAYS IN NOSE ONCE A DAY 16 g 5  . furosemide (LASIX) 20 MG tablet TAKE ONE TABLET DAILY 30 tablet 0  . gabapentin (NEURONTIN) 100 MG capsule TAKE ONE CAPSULE TWICE A DAY (Patient taking differently: Take 100 mg by mouth 2 (two) times daily. ) 180 capsule 2  . ipratropium (ATROVENT) 0.03 % nasal spray Place 2 sprays into both nostrils every 12 (twelve) hours.    Marland Kitchen levalbuterol (XOPENEX) 0.63 MG/3ML nebulizer solution Take 3 mLs (0.63 mg total) by nebulization every 4 (four) hours as needed for wheezing or shortness of breath. 360 mL 5  . levothyroxine (SYNTHROID) 125 MCG tablet TAKE ONE TABLET AT BEDTIME 90 tablet 0  . lisinopril (PRINIVIL,ZESTRIL) 10 MG tablet Take 10 mg by mouth daily.    Marland Kitchen lisinopril (ZESTRIL) 5 MG tablet TAKE ONE TABLET DAILY 30 tablet 1  . memantine (NAMENDA) 5 MG tablet 1  bid 60 tablet 0  . oxyCODONE-acetaminophen (PERCOCET/ROXICET) 5-325 MG tablet Take  1-2 tablets by mouth every 3 (three) hours as needed for moderate pain or severe pain. 30 tablet 0  . pantoprazole (PROTONIX) 40 MG tablet TAKE ONE TABLET DAILY 90 tablet 0  . propranolol (INDERAL) 80 MG tablet TAKE ONE-HALF TABLET 3 TIMES DAILY 135 tablet 1  . sodium chloride 1 g tablet TAKE ONE TABLET THREE TIMES DAILY 180 tablet 0  . temazepam (RESTORIL) 30 MG capsule Take 1 capsule (30  mg total) by mouth at bedtime. 30 capsule 4  . umeclidinium-vilanterol (ANORO ELLIPTA) 62.5-25 MCG/INH AEPB Inhale 1 puff into the lungs daily. 60 each 3   No current facility-administered medications for this visit.     Neurologic: Headache: No Seizure: No Paresthesias:No  Musculoskeletal: Strength & Muscle Tone: decreased Gait & Station: unsteady Patient leans: N/A  Psychiatric Specialty Exam: ROS  There were no vitals taken for this visit.There is no height or weight on file to calculate BMI.  General Appearance: Casual  Eye Contact:  Good  Speech:  Clear and Coherent  Volume:  Normal  Mood:  NA  Affect:  depressed  Thought Process:  Goal Directed  Orientation:  Full (Time, Place, and Person)  Thought Content:  WDL  Suicidal Thoughts:  No  Homicidal Thoughts:  No  Memory:  Immediate;   Poor  Judgement:  Fair  Insight:  Lacking  Psychomotor Activity:  Normal  Concentration:    Recall:  Poor  Fund of Knowledge:Poor  Language: Fair  Akathisia:  No  Handed:  Right  AIMS (if indicated):   Assets:  Communication Skills  ADL's:  Impaired  Cognition: Impaired,  Moderate  Sleep:      Treatment Plan Summary: This patient's first problem is that of major depression.  At this time she is in remission.  At this time she will continue taking Wellbutrin.  Her second problem is that of insomnia.  She takes Restoril for this and does well.  She was in the midst of a work-up for dementia but now it appears that she at least has only mild dementia if anything.  When she seen again in 4 months we should consider discontinuing her Namenda.  I do not believe this latter agent would be indicated.  Also at that time review all her blood work that she has had for cognitive deficit.  Patient is very stable.  She is functioning well.  She is not fatigued.  She will be seen again in 4 months.

## 2018-11-10 ENCOUNTER — Other Ambulatory Visit: Payer: Self-pay | Admitting: Family Medicine

## 2018-11-16 ENCOUNTER — Other Ambulatory Visit: Payer: Self-pay | Admitting: Internal Medicine

## 2018-11-28 ENCOUNTER — Other Ambulatory Visit: Payer: Self-pay | Admitting: Family Medicine

## 2018-11-29 DIAGNOSIS — D229 Melanocytic nevi, unspecified: Secondary | ICD-10-CM | POA: Diagnosis not present

## 2018-11-29 DIAGNOSIS — L821 Other seborrheic keratosis: Secondary | ICD-10-CM | POA: Diagnosis not present

## 2018-11-29 DIAGNOSIS — L570 Actinic keratosis: Secondary | ICD-10-CM | POA: Diagnosis not present

## 2018-11-29 DIAGNOSIS — D1801 Hemangioma of skin and subcutaneous tissue: Secondary | ICD-10-CM | POA: Diagnosis not present

## 2018-11-29 DIAGNOSIS — D485 Neoplasm of uncertain behavior of skin: Secondary | ICD-10-CM | POA: Diagnosis not present

## 2018-11-29 DIAGNOSIS — L814 Other melanin hyperpigmentation: Secondary | ICD-10-CM | POA: Diagnosis not present

## 2018-12-10 ENCOUNTER — Other Ambulatory Visit (HOSPITAL_COMMUNITY): Payer: Self-pay

## 2018-12-10 MED ORDER — MEMANTINE HCL 5 MG PO TABS: ORAL_TABLET | ORAL | 0 refills | Status: DC

## 2018-12-17 ENCOUNTER — Other Ambulatory Visit: Payer: Self-pay

## 2018-12-17 ENCOUNTER — Ambulatory Visit (INDEPENDENT_AMBULATORY_CARE_PROVIDER_SITE_OTHER): Payer: Medicare Other | Admitting: Internal Medicine

## 2018-12-17 ENCOUNTER — Encounter: Payer: Self-pay | Admitting: Internal Medicine

## 2018-12-17 VITALS — BP 128/72 | HR 60 | Ht 67.0 in | Wt 203.0 lb

## 2018-12-17 DIAGNOSIS — R635 Abnormal weight gain: Secondary | ICD-10-CM

## 2018-12-17 DIAGNOSIS — E039 Hypothyroidism, unspecified: Secondary | ICD-10-CM | POA: Diagnosis not present

## 2018-12-17 DIAGNOSIS — I471 Supraventricular tachycardia, unspecified: Secondary | ICD-10-CM

## 2018-12-17 DIAGNOSIS — Z23 Encounter for immunization: Secondary | ICD-10-CM | POA: Diagnosis not present

## 2018-12-17 DIAGNOSIS — I4892 Unspecified atrial flutter: Secondary | ICD-10-CM

## 2018-12-17 LAB — T4, FREE: Free T4: 1.48 ng/dL (ref 0.82–1.77)

## 2018-12-17 LAB — TSH: TSH: 2.3 u[IU]/mL (ref 0.450–4.500)

## 2018-12-17 NOTE — Progress Notes (Signed)
HPI Destiny Hughes returns today for followup. She has a h/o SVT and atrial flutter and since she underwent catheter ablation she has done well with no chest pain or sob. No syncope. She has gained weight. She denies dietary indiscretion.  Allergies  Allergen Reactions  . Albuterol Shortness Of Breath and Other (See Comments)  . Penicillins Anaphylaxis, Swelling and Other (See Comments)    Has patient had a PCN reaction causing immediate rash, facial/tongue/throat swelling, SOB or lightheadedness with hypotension: Yes Has patient had a PCN reaction causing severe rash involving mucus membranes or skin necrosis: Rash Has patient had a PCN reaction that required hospitalization: No Has patient had a PCN reaction occurring within the last 10 years: No If all of the above answers are "NO", then may proceed with Cephalosporin use.  . Sulfa Antibiotics Swelling and Other (See Comments)    Throat swells, but no shortness of breath noted  . Codeine Nausea And Vomiting  . Ecotrin [Aspirin] Nausea And Vomiting  . Zanaflex [Tizanidine] Other (See Comments)    Possible confusion     Current Outpatient Medications  Medication Sig Dispense Refill  . acetaminophen (TYLENOL) 325 MG tablet Take 650 mg by mouth every 6 (six) hours as needed (pain.).    Marland Kitchen amLODipine (NORVASC) 5 MG tablet TAKE ONE TABLET EACH DAY 90 tablet 1  . buPROPion (WELLBUTRIN XL) 150 MG 24 hr tablet Take 2 tablets (300 mg total) by mouth every morning. 60 tablet 2  . divalproex (DEPAKOTE ER) 500 MG 24 hr tablet Take 2 tablets (1,000 mg total) by mouth daily. 60 tablet 4  . donepezil (ARICEPT) 10 MG tablet Take 1 tablet (10 mg total) by mouth at bedtime. 30 tablet 5  . fluticasone (FLONASE) 50 MCG/ACT nasal spray 1 OR 2 SPRAYS IN NOSE ONCE A DAY 16 g 5  . furosemide (LASIX) 20 MG tablet TAKE ONE TABLET DAILY 30 tablet 0  . gabapentin (NEURONTIN) 100 MG capsule TAKE ONE CAPSULE TWICE A DAY (Patient taking differently: Take  100 mg by mouth 2 (two) times daily. ) 180 capsule 2  . ipratropium (ATROVENT) 0.03 % nasal spray Place 2 sprays into both nostrils every 12 (twelve) hours.    Marland Kitchen levalbuterol (XOPENEX) 0.63 MG/3ML nebulizer solution Take 3 mLs (0.63 mg total) by nebulization every 4 (four) hours as needed for wheezing or shortness of breath. 360 mL 5  . levothyroxine (SYNTHROID) 125 MCG tablet TAKE ONE TABLET AT BEDTIME 90 tablet 0  . lisinopril (PRINIVIL,ZESTRIL) 10 MG tablet Take 10 mg by mouth daily.    . memantine (NAMENDA) 5 MG tablet 1  bid 60 tablet 0  . oxyCODONE-acetaminophen (PERCOCET/ROXICET) 5-325 MG tablet Take 1-2 tablets by mouth every 3 (three) hours as needed for moderate pain or severe pain. 30 tablet 0  . pantoprazole (PROTONIX) 40 MG tablet TAKE ONE TABLET DAILY 90 tablet 0  . propranolol (INDERAL) 80 MG tablet TAKE ONE-HALF TABLET 3 TIMES DAILY 135 tablet 1  . sodium chloride 1 g tablet TAKE ONE TABLET THREE TIMES DAILY 180 tablet 0  . temazepam (RESTORIL) 30 MG capsule Take 1 capsule (30 mg total) by mouth at bedtime. 30 capsule 4  . umeclidinium-vilanterol (ANORO ELLIPTA) 62.5-25 MCG/INH AEPB Inhale 1 puff into the lungs daily. 60 each 3   No current facility-administered medications for this visit.      Past Medical History:  Diagnosis Date  . Arthritis    OA- knees & back  neck  . Chronic back pain   . Cognitive retention disorder   . Collapse of right lung   . COPD (chronic obstructive pulmonary disease) (HCC)    Hx. Bronchitis occ, 01-25-11 somes issue now taking  Z-pak-started 01-24-11/Scar tissue  present  from previous lung collapse  . Dementia with psychosis ([place])   . Depression   . Depression   . Dyspnea   . Dysrhythmia 2018   treated with Ablation   . GERD (gastroesophageal reflux disease)    tx. TUMS  . Glaucoma   . Headache   . History of kidney stones    surgically treated  . Hyperlipemia   . Hypertension   . Hypothyroidism    tx. Levothyroxine  . Major  depression with psychotic features ([place])   . Nephrolithiasis   . Neuromuscular disorder ([place]) 01-25-11   Pain/nerve stimulator implanted -2 yrs ago.  Marland Kitchen Reflex, gag, absent   . Reflex, gag, absent     ROS:   All systems reviewed and negative except as noted in the HPI.   Past Surgical History:  Procedure Laterality Date  . A-FLUTTER ABLATION N/A 08/16/2016   Procedure: A-Flutter Ablation;  Surgeon: Evans Lance, MD;  Location: [facility];  Service: Cardiovascular;  Laterality: N/A;  . ANTERIOR CERVICAL DECOMP/DISCECTOMY FUSION     3 levels, per family  . APPENDECTOMY  01-25-11  . CARPAL TUNNEL RELEASE  01-25-11   Bil.  . CERVICAL SPINE SURGERY  01-25-11   2005-multiple levels  . CHOLECYSTECTOMY  01-25-11   '07  . COLONOSCOPY WITH PROPOFOL N/A 07/23/2012   Procedure: COLONOSCOPY WITH PROPOFOL;  Surgeon: Garlan Fair, MD;  [facility];  Service: Endoscopy;  Laterality: N/A;  . detatched retna Bilateral    done July & August- 2019  . EYE SURGERY Bilateral 2019   repair of retina detachment- Dr. Zadie Rhine at surgical center   . JOINT REPLACEMENT  01-25-11   6'03 LTHA-hemi  . KNEE ARTHROPLASTY  01-25-11   '04-right, revised x2  . no gag reflex     Pt does not havea gag reflex following  siurgery to neck. Family unsure of date. Pt takes pill in apple sauce.  . SPINAL CORD STIMULATOR BATTERY EXCHANGE N/A 01/12/2018   Procedure: Spinal cord stimulator battery replacement;  Surgeon: Clydell Hakim, MD;  Location: [facility];  Service: Neurosurgery;  Laterality: N/A;  left  . SPINAL CORD STIMULATOR IMPLANT  2009 APPROX   DR. ELSNER  . SPINAL CORD STIMULATOR REMOVAL N/A 06/04/2018   Procedure: Removal of spinal cord stimulator, thoracic paddle and generator;  Surgeon: Kristeen Miss, MD;  Location: [facility];  Service: Neurosurgery;  Laterality: N/A;  . THORACIC SPINE SURGERY  01-25-11   6'02- then nerve stimulator implanted after  . TOTAL KNEE REVISION  02/01/2011   Procedure:  TOTAL KNEE REVISION;  Surgeon: Mauri Pole;  [facility];  Service: Orthopedics;  Laterality: Right;  . TUBAL LIGATION       Family History  Problem Relation Age of Onset  . Heart attack Father   . Emphysema Father   . Aneurysm Mother        CEREBRAL  . Emphysema Mother   . Dementia Mother   . Diabetes Son   . Hypertension Son   . Hypertension Son   . Cancer Sister        Widely Metastatic  . Asthma Son        x2  . Breast cancer  Paternal Aunt   . Rheum arthritis Maternal Aunt      Social History   Socioeconomic History  . Marital status: Married    Spouse name: Not on file  . Number of children: 2  . Years of education: Not on file  . Highest education level: Not on file  Occupational History  . Occupation: disabled  Social Needs  . Financial resource strain: Not on file  . Food insecurity    Worry: Not on file    Inability: Not on file  . Transportation needs    Medical: Not on file    Non-medical: Not on file  Tobacco Use  . Smoking status: Former Smoker    Packs/day: 0.75    Years: 50.00    Pack years: 37.50    Types: Cigarettes    Start date: 09/13/1961    Quit date: 05/2016    Years since quitting: 2.5  . Smokeless tobacco: Never Used  . Tobacco comment: Decreased Down to 2/3 a Day if Thinking Abouit It  Substance and Sexual Activity  . Alcohol use: No    Alcohol/week: 0.0 standard drinks    Comment: none in 10 years  . Drug use: No  . Sexual activity: Never  Lifestyle  . Physical activity    Days per week: Not on file    Minutes per session: Not on file  . Stress: Not on file  Relationships  . Social Herbalist on phone: Not on file    Gets together: Not on file    Attends religious service: Not on file    Active member of club or organization: Not on file    Attends meetings of clubs or organizations: Not on file    Relationship status: Not on file  . Intimate partner violence    Fear of current or ex partner: Not on  file    Emotionally abused: Not on file    Physically abused: Not on file    Forced sexual activity: Not on file  Other Topics Concern  . Not on file  Social History Narrative   She is from [facility]. She has always lived in [place]. She has traveled to [place], [place], [place], [place], & [place]. 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 Prior exposure to parakeets with last exposure remote in her current home. Previously had mold in her home that was in her bathroom & fixed. Prior exposure to hot tubs but none recently. Pt lives at home with [name], husband.  Has 2 childrean, 12th grade education.       BP 128/72   Pulse 60   Ht 5\' 7"  (1.702 m)   Wt 203 lb (92.1 kg)   SpO2 95%   BMI 31.79 kg/m   Physical Exam:  Well appearing NAD HEENT: Unremarkable Neck:  No JVD, no thyromegally Lymphatics:  No adenopathy Back:  No CVA tenderness Lungs:  Clear with no wheezes HEART:  Regular rate rhythm, no murmurs, no rubs, no clicks Abd:  soft, positive bowel sounds, no organomegally, no rebound, no guarding Ext:  2 plus pulses, no edema, no cyanosis, no clubbing Skin:  No rashes no nodules Neuro:  CN II through XII intact, motor grossly intact  EKG - nsr   Assess/Plan: 1. SVT/Atrial flutter - she is s/p ablation of both and doing well from an arrhythmia perspective.  2. Weight gain/obesity - she denies dietary non-compliance. I will have her check her TSH and T4 as she is on synthroid. 3. HTN - her SBP is well controlled. Continue.  Mikle Bosworth.D.

## 2018-12-17 NOTE — Patient Instructions (Addendum)
Medication Instructions:  Your physician recommends that you continue on your current medications as directed. Please refer to the Current Medication list given to you today.  Labwork: You will get lab work today:  TSH and free T4  Testing/Procedures: None ordered.  Follow-Up: Your physician wants you to follow-up in: as needed with Dr. Lovena Le.     Any Other Special Instructions Will Be Listed Below (If Applicable).  If you need a refill on your cardiac medications before your next appointment, please call your pharmacy.

## 2018-12-21 ENCOUNTER — Other Ambulatory Visit: Payer: Self-pay | Admitting: Family Medicine

## 2018-12-24 DIAGNOSIS — L905 Scar conditions and fibrosis of skin: Secondary | ICD-10-CM | POA: Diagnosis not present

## 2018-12-24 DIAGNOSIS — D485 Neoplasm of uncertain behavior of skin: Secondary | ICD-10-CM | POA: Diagnosis not present

## 2018-12-25 ENCOUNTER — Telehealth: Payer: Self-pay | Admitting: Emergency Medicine

## 2018-12-25 NOTE — Telephone Encounter (Signed)
Called and spoke to pt's husband, Destiny Hughes, and pt. Pt states she doesn't think the Anoro is helping. Per pt's chart Nicole Kindred changed her from Anoro to [place] back in May. Pt states she has not started taking this even though they have received several months supply of Stiolto from pt assistance. Advised pt, per Tonya's recs, to change to the [employer]. Appt made to see RB on 10/19. Pt verbalized understanding and also aware to call back if medication doesn't help.    We have tried Anoro 1 inhalation once daily to see if it would help your breathing and if you can tolerate it with regard to your cough. Since you could not tolerate medication we will give you samples of stiolto 2 puffs daily and try to get it approved through your insurance.  Please continue your nasal sprays as ordered, Zyrtec as ordered.  Will place referral for ENT regarding your continued nasal drainage.  This is likely contributing to your cough. Please continue your Protonix.  Breakthrough reflux could also contribute to cough. We suspect that your lisinopril is also contributing to your cough.  Please discuss with Dr. Volanda Napoleon the possibility of changing this to an alternative medication such as valsartan.   Follow with Dr. [name] in 4 months or sooner if you have any problems.

## 2018-12-29 ENCOUNTER — Other Ambulatory Visit: Payer: Self-pay | Admitting: Family Medicine

## 2019-01-07 ENCOUNTER — Other Ambulatory Visit: Payer: Self-pay

## 2019-01-07 ENCOUNTER — Other Ambulatory Visit: Payer: Self-pay | Admitting: Family Medicine

## 2019-01-07 ENCOUNTER — Ambulatory Visit (INDEPENDENT_AMBULATORY_CARE_PROVIDER_SITE_OTHER): Payer: Medicare Other | Admitting: Emergency Medicine

## 2019-01-07 ENCOUNTER — Encounter: Payer: Self-pay | Admitting: Emergency Medicine

## 2019-01-07 DIAGNOSIS — R600 Localized edema: Secondary | ICD-10-CM

## 2019-01-07 DIAGNOSIS — J449 Chronic obstructive pulmonary disease, unspecified: Secondary | ICD-10-CM | POA: Diagnosis not present

## 2019-01-07 DIAGNOSIS — J9601 Acute respiratory failure with hypoxia: Secondary | ICD-10-CM

## 2019-01-07 DIAGNOSIS — J9602 Acute respiratory failure with hypercapnia: Secondary | ICD-10-CM | POA: Diagnosis not present

## 2019-01-07 MED ORDER — FUROSEMIDE 20 MG PO TABS: 20.0000 mg | ORAL_TABLET | Freq: Every day | ORAL | 0 refills | Status: DC

## 2019-01-07 MED ORDER — ALBUTEROL SULFATE (2.5 MG/3ML) 0.083% IN NEBU: 2.5000 mg | INHALATION_SOLUTION | Freq: Four times a day (QID) | RESPIRATORY_TRACT | 11 refills | Status: DC | PRN

## 2019-01-07 MED ORDER — LEVOTHYROXINE SODIUM 125 MCG PO TABS: 125.0000 ug | ORAL_TABLET | Freq: Every day | ORAL | 0 refills | Status: DC

## 2019-01-07 NOTE — Assessment & Plan Note (Signed)
Severe disease with significant functional limitation.  She does not exert enough to desaturate or qualify for supplemental oxygen.  Within a difficult time finding an adequate bronchodilator regimen.  She is currently on Stiolto, tolerating and probably benefiting.  I have asked him to ensure that she only takes it once daily-they were taking 1 puff twice a day.  She needs albuterol that she can use as needed.  We will send a prescription for nebs today that she should be able to get through Medicare part B

## 2019-01-07 NOTE — Assessment & Plan Note (Signed)
I have attempted to establish desaturation with exertion to see if she will qualify for oxygen but she cannot walk far enough to actually desaturate

## 2019-01-07 NOTE — Progress Notes (Signed)
   Subjective:    Patient ID: Destiny Hughes, female    DOB: June 02, 1945, 73 y.o.   MRN: [id number]  HPI 73 year old former smoker with a history of depression, dementia, GERD, hyperlipidemia, hypothyroidism, chronic pain with a implanted nerve stimulator, A Fib s/p cardioversion last year.  She is been followed by Dr. Ashok Cordia in our office for very severe COPD with emphysematous change.  FEV1 0.75 L (35% predicted) 08/27/2015. Last seen here 04/2016.   [name] 01/07/2019 --73 year old former smoker with dementia, GERD, depression, hyperlipidemia and hypothyroidism, atrial fibrillation.  She has very severe COPD with emphysematous change, severe obstruction noted on pulmonary function testing last 08/27/2015.  She is been tried on multiple bronchodilators including Spiriva, Bevespi, Anoro.  She may have gotten benefit from [facility] but it was cost prohibitive.  Since last time she has been able get patient assistance for Stiolto. She does not have any albuterol or rescue meds.  She has persistent rhinorrhea, does not believe that she gets any benefit from steroid nasal spray.  She is on an antihistamine. She has throat clearing, minimal cough.  She did not desaturate on ambulation 12/05/2017.  Her functional capacity and walk distance are both limited by dyspnea.    Review of Systems      Objective:   Physical Exam Vitals:   01/07/19 1455  BP: (!) 128/94  Pulse: 92  SpO2: 93%  Weight: 198 lb (89.8 kg)  Height: 5\' 6"  (1.676 m)   Gen: Pleasant, well-nourished, in no distress, in a wheelchair  ENT: No lesions,  mouth clear,  oropharynx clear, no postnasal drip  Neck: No JVD, no stridor  Lungs: No use of accessory muscles, decreased bilaterally, no wheezing, no crackles  Cardiovascular: RRR, heart sounds normal, no murmur or gallops, 1-2 + peripheral edema  Musculoskeletal: No deformities, no cyanosis or clubbing  Neuro: alert, forgetful, some pressured speech.  Moves all extremities, no  apparent focal deficits  Skin: Warm, no lesions or rash     Assessment & Plan:  COPD (chronic obstructive pulmonary disease) (HCC) Severe disease with significant functional limitation.  She does not exert enough to desaturate or qualify for supplemental oxygen.  Within a difficult time finding an adequate bronchodilator regimen.  She is currently on Stiolto, tolerating and probably benefiting.  I have asked him to ensure that she only takes it once daily-they were taking 1 puff twice a day.  She needs albuterol that she can use as needed.  We will send a prescription for nebs today that she should be able to get through Medicare part B  Acute respiratory failure with hypoxia and hypercapnia ([place]) I have attempted to establish desaturation with exertion to see if she will qualify for oxygen but she cannot walk far enough to actually desaturate  Baltazar Apo, MD, PhD 01/07/2019, 3:23 PM [facility] [phone] or if no answer [phone]

## 2019-01-07 NOTE — Patient Instructions (Signed)
We will continue Stiolto 2 puffs once daily.  Take 2 puffs at the same time every day as your maintenance dose.  Do not take at any other times during the day. Please use albuterol nebulizer up to every 4 hours if you need it for shortness of breath, chest tightness, wheezing during the day.  You can take this on an as-needed basis.  We will work on getting you nebulizer supplies through your medical equipment company. Walking oxygen test today to see if you qualify for supplemental oxygen Please continue loratadine 10 mg daily. Follow with Dr [name] in 6 months or sooner if you have any problems

## 2019-01-11 ENCOUNTER — Other Ambulatory Visit: Payer: Self-pay | Admitting: Internal Medicine

## 2019-01-11 IMAGING — DX DG CHEST 2V
2 series · 2 of 2 positions shown · non-contrast
Comparison: 11/20/2016 and 10/19/2016

CLINICAL DATA: Shortness of breath.

EXAM:
CHEST - 2 VIEW

[chest pa]
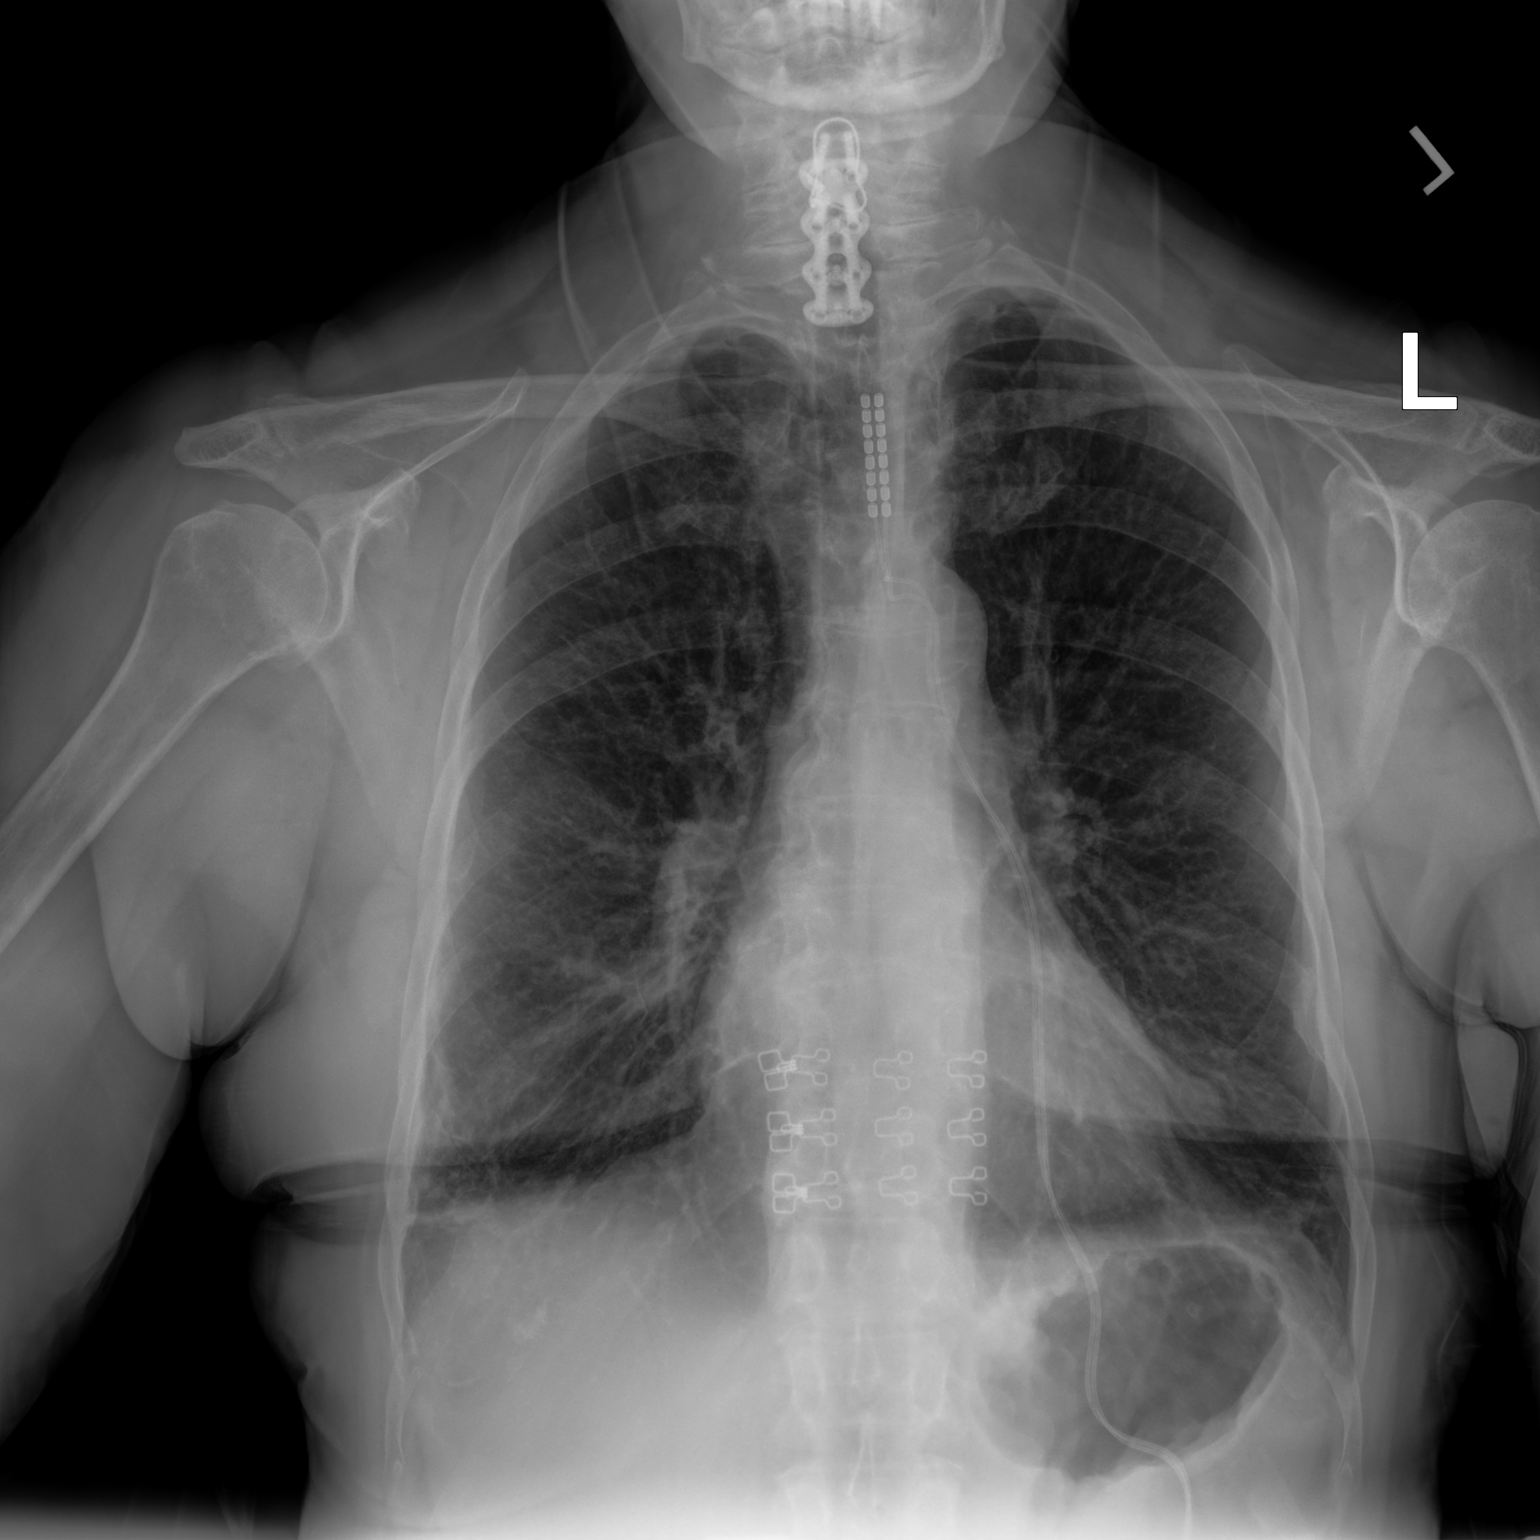

[chest lat]
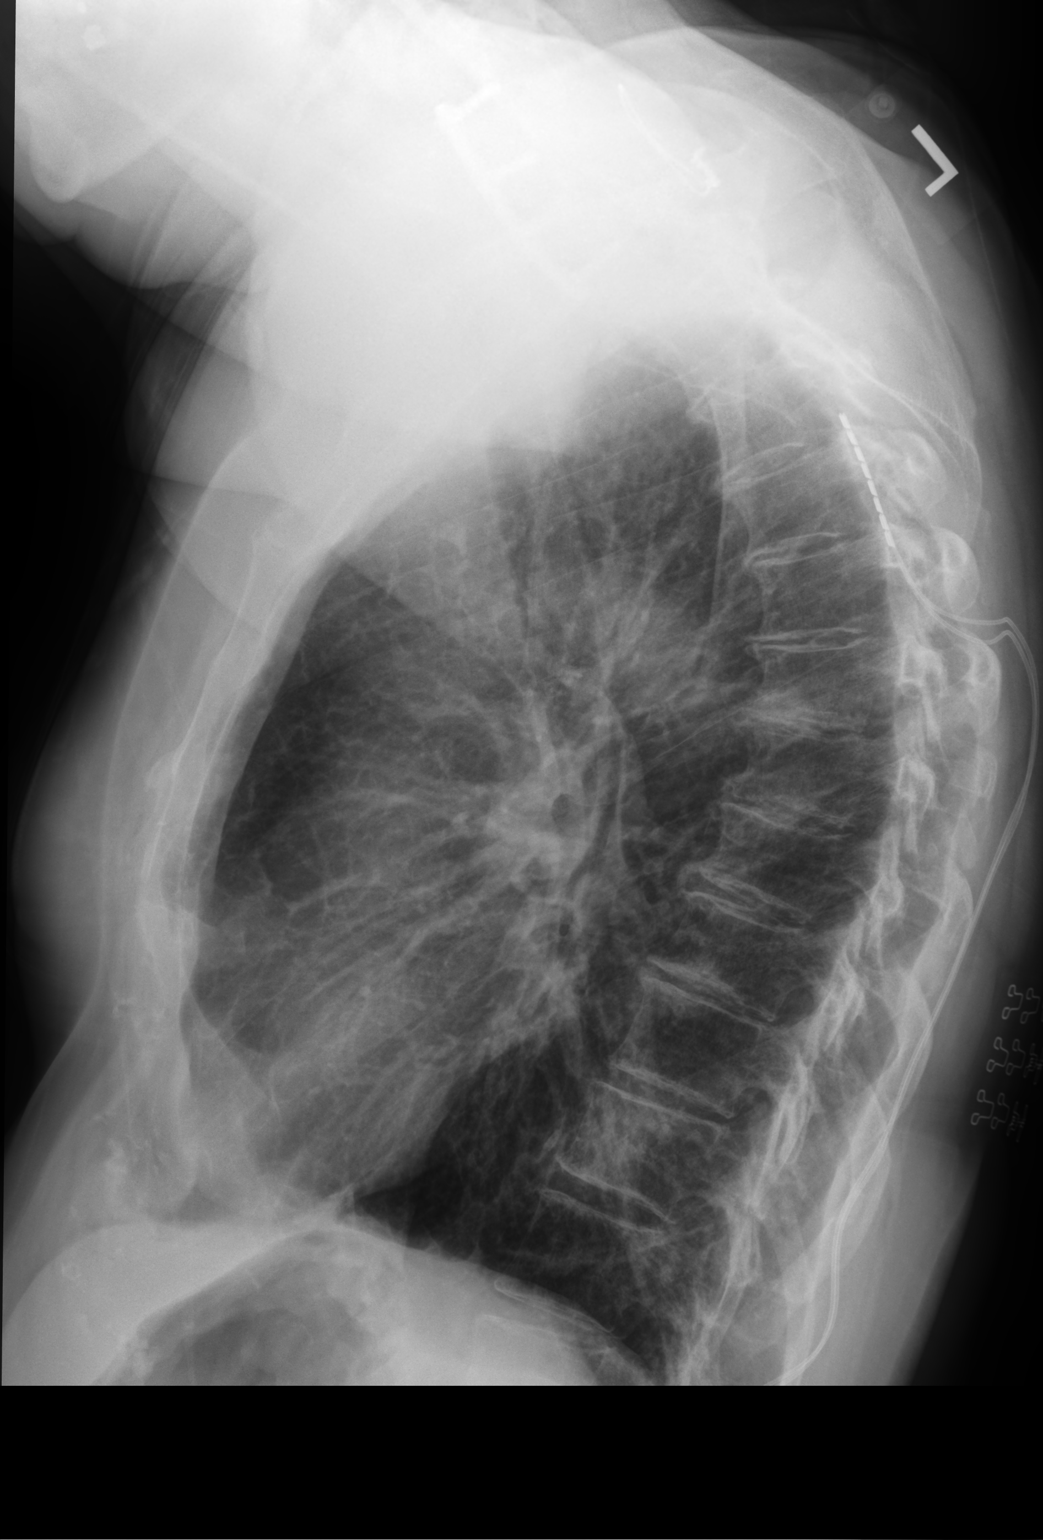

[2 of 2 positions shown; findings below may reference images not displayed]

FINDINGS: The heart size and pulmonary vascularity are normal. The lungs are
hyperinflated with flattening of the diaphragm. Slight scarring at
the lung bases. Discrete infiltrates or effusions.

Upper thoracic spinal cord stimulator in place, unchanged. No acute
bone abnormality.
IMPRESSION: Hyperinflated lungs.  Scarring at the lung bases.

## 2019-01-15 ENCOUNTER — Other Ambulatory Visit: Payer: Self-pay | Admitting: Internal Medicine

## 2019-01-15 NOTE — Telephone Encounter (Signed)
[name] husband calling requesting a refill on lisinopril 5 mg tablet. This medication is no on pt's medication list. Lisinopril 10 mg tablets is on pt's med list. Please clarify what strength pt is supposed to be taking. Please address

## 2019-01-17 ENCOUNTER — Telehealth: Payer: Self-pay | Admitting: Emergency Medicine

## 2019-01-17 DIAGNOSIS — J449 Chronic obstructive pulmonary disease, unspecified: Secondary | ICD-10-CM

## 2019-01-17 NOTE — Telephone Encounter (Signed)
Called and spoke w/ patient's spouse, [name] (on [place]). [name] states pt was supposed to receive a nebulizer machine per RB's recommendations from her [place] 01/07/2019; however, has not heard anything. According to RB's notes, we were going to work on supplying pt with nebulizer supplies through pt's medical equipment company. Pt spouse states pt is not established with a home care company. I let him know we would place an order for a nebulizer machine to a DME for pt to attain. Pt spouse verbalized understanding with no additional questions or concerns. Order for nebulizer machine has been placed. Nothing further needed at this time.

## 2019-01-30 ENCOUNTER — Ambulatory Visit (INDEPENDENT_AMBULATORY_CARE_PROVIDER_SITE_OTHER): Payer: Medicare Other | Admitting: Psychiatry

## 2019-01-30 ENCOUNTER — Other Ambulatory Visit: Payer: Self-pay

## 2019-01-30 ENCOUNTER — Encounter (HOSPITAL_COMMUNITY): Payer: Self-pay | Admitting: Psychiatry

## 2019-01-30 VITALS — BP 148/78 | HR 72 | Temp 98.1°F | Ht 66.5 in | Wt 197.0 lb

## 2019-01-30 DIAGNOSIS — F323 Major depressive disorder, single episode, severe with psychotic features: Secondary | ICD-10-CM | POA: Diagnosis not present

## 2019-01-30 DIAGNOSIS — F3342 Major depressive disorder, recurrent, in full remission: Secondary | ICD-10-CM | POA: Diagnosis not present

## 2019-01-30 MED ORDER — DONEPEZIL HCL 10 MG PO TABS: 10.0000 mg | ORAL_TABLET | Freq: Every day | ORAL | 5 refills | Status: DC

## 2019-01-30 MED ORDER — BUPROPION HCL ER (XL) 150 MG PO TB24: 300.0000 mg | ORAL_TABLET | ORAL | 6 refills | Status: DC

## 2019-01-30 MED ORDER — BUPROPION HCL ER (XL) 150 MG PO TB24: 300.0000 mg | ORAL_TABLET | ORAL | 2 refills | Status: DC

## 2019-01-30 MED ORDER — TEMAZEPAM 30 MG PO CAPS: 30.0000 mg | ORAL_CAPSULE | Freq: Every day | ORAL | 4 refills | Status: DC

## 2019-01-30 NOTE — Progress Notes (Signed)
Psychiatric Initial Adult Assessment   Patient Identification: Destiny Hughes MRN:  [id number] Date of Evaluation:  01/30/2019 Referral Source: [facility] Chief Complaint: Major depression remission  Today the patient is seen with her husband [name].  Patient is being treated here for major depression.  Was also believe that she had a significant dementing process.  She has had past episodes of psychosis.  At this time the patient is doing amazingly well.  Over the last year or 2 we have taken her off all her antipsychotic medications and she is better off of them.  She is more alert attentive interactive and enjoyed herself.  She is smiling and articulate.  Cognitively she is functioning much better.  She goes to [employer] takes out books and reads them.  Some things have not completely returned.  Her husband took over cooking a few years ago and is never given it back.  He does all the grocery shopping.  She does few of her institutional ADLs.  She no longer drives.  On the other hand the patient does all her instrumental ADLs without a problem.  Today the patient had a Mini-Mental Status exam.  Given her age of 17 and having more than a high school education her excepted score would have been approximately a 28.  She scored a 22 implicating mild dementia.  The patient is no longer irritable or agitated.  She is much more cooperative and pleasant.  Her husband is very pleased by her improvement.  Her physical health is good.  She denies chest pain or shortness of breath or any acute neurological symptoms.  She does walk very slowly and needs assistance of a cane.  The patient is not suicidal.  She denies the use of alcohol or drugs.  (Hypo) Manic Symptoms:   Anxiety Symptoms:   Psychotic Symptoms:   PTSD Symptoms:   Past Psychiatric History: none  Previous Psychotropic Medications: Yes   Substance Abuse History in the last 12 months:  No.  Consequences of Substance  Abuse:   Past Medical History:  Past Medical History:  Diagnosis Date  . Arthritis    OA- knees & back  neck  . Chronic back pain   . Cognitive retention disorder   . Collapse of right lung   . COPD (chronic obstructive pulmonary disease) (HCC)    Hx. Bronchitis occ, 01-25-11 somes issue now taking  Z-pak-started 01-24-11/Scar tissue  present  from previous lung collapse  . Dementia with psychosis ([place])   . Depression   . Depression   . Dyspnea   . Dysrhythmia 2018   treated with Ablation   . GERD (gastroesophageal reflux disease)    tx. TUMS  . Glaucoma   . Headache   . History of kidney stones    surgically treated  . Hyperlipemia   . Hypertension   . Hypothyroidism    tx. Levothyroxine  . Major depression with psychotic features ([place])   . Nephrolithiasis   . Neuromuscular disorder ([place]) 01-25-11   Pain/nerve stimulator implanted -2 yrs ago.  Marland Kitchen Reflex, gag, absent   . Reflex, gag, absent     Past Surgical History:  Procedure Laterality Date  . A-FLUTTER ABLATION N/A 08/16/2016   Procedure: A-Flutter Ablation;  Surgeon: Evans Lance, MD;  Location: [facility];  Service: Cardiovascular;  Laterality: N/A;  . ANTERIOR CERVICAL DECOMP/DISCECTOMY FUSION     3 levels, per family  . APPENDECTOMY  01-25-11  . CARPAL  TUNNEL RELEASE  01-25-11   Bil.  . CERVICAL SPINE SURGERY  01-25-11   2005-multiple levels  . CHOLECYSTECTOMY  01-25-11   '07  . COLONOSCOPY WITH PROPOFOL N/A 07/23/2012   Procedure: COLONOSCOPY WITH PROPOFOL;  Surgeon: Garlan Fair, MD;  Location: [facility];  Service: Endoscopy;  Laterality: N/A;  . detatched retna Bilateral    done July & August- 2019  . EYE SURGERY Bilateral 2019   repair of retina detachment- Dr. Zadie Rhine at surgical center   . JOINT REPLACEMENT  01-25-11   6'03 LTHA-hemi  . KNEE ARTHROPLASTY  01-25-11   '04-right, revised x2  . no gag reflex     Pt does not havea gag reflex following  siurgery to neck. Family unsure of date.  Pt takes pill in apple sauce.  . SPINAL CORD STIMULATOR BATTERY EXCHANGE N/A 01/12/2018   Procedure: Spinal cord stimulator battery replacement;  Surgeon: Clydell Hakim, MD;  Location: [facility];  Service: Neurosurgery;  Laterality: N/A;  left  . SPINAL CORD STIMULATOR IMPLANT  2009 APPROX   DR. ELSNER  . SPINAL CORD STIMULATOR REMOVAL N/A 06/04/2018   Procedure: Removal of spinal cord stimulator, thoracic paddle and generator;  Surgeon: Kristeen Miss, MD;  Location: [facility];  Service: Neurosurgery;  Laterality: N/A;  . THORACIC SPINE SURGERY  01-25-11   6'02- then nerve stimulator implanted after  . TOTAL KNEE REVISION  02/01/2011   Procedure: TOTAL KNEE REVISION;  Surgeon: Mauri Pole;  Location: [facility];  Service: Orthopedics;  Laterality: Right;  . TUBAL LIGATION      Family Psychiatric History:   Family History:  Family History  Problem Relation Age of Onset  . Heart attack Father   . Emphysema Father   . Aneurysm Mother        CEREBRAL  . Emphysema Mother   . Dementia Mother   . Diabetes Son   . Hypertension Son   . Hypertension Son   . Cancer Sister        Widely Metastatic  . Asthma Son        x2  . Breast cancer Paternal Aunt   . Rheum arthritis Maternal Aunt     Social History:   Social History   Socioeconomic History  . Marital status: Married    Spouse name: Not on file  . Number of children: 2  . Years of education: Not on file  . Highest education level: Not on file  Occupational History  . Occupation: disabled  Social Needs  . Financial resource strain: Not on file  . Food insecurity    Worry: Not on file    Inability: Not on file  . Transportation needs    Medical: Not on file    Non-medical: Not on file  Tobacco Use  . Smoking status: Former Smoker    Packs/day: 0.75    Years: 50.00    Pack years: 37.50    Types: Cigarettes    Start date: 09/13/1961    Quit date: 05/2016    Years since quitting: 2.7  . Smokeless tobacco: Never Used  . Tobacco  comment: Decreased Down to 2/3 a Day if Thinking [place] It  Substance and Sexual Activity  . Alcohol use: No    Alcohol/week: 0.0 standard drinks    Comment: none in 10 years  . Drug use: No  . Sexual activity: Never  Lifestyle  . Physical activity    Days per week: Not on file  Minutes per session: Not on file  . Stress: Not on file  Relationships  . Social Herbalist on phone: Not on file    Gets together: Not on file    Attends religious service: Not on file    Active member of club or organization: Not on file    Attends meetings of clubs or organizations: Not on file    Relationship status: Not on file  Other Topics Concern  . Not on file  Social History Narrative   She is from [facility] [place] [facility]. She has always lived in [place]. She has traveled to [place], [place], [place], [place], & [place]. 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 Prior exposure to parakeets with last exposure remote in her current home. Previously had mold in her home that was in her bathroom & fixed. Prior exposure to hot tubs but none recently. Pt lives at home with [name], husband.  Has 2 childrean, 12th grade education.      Additional Social History:   Allergies:   Allergies  Allergen Reactions  . Albuterol Shortness Of Breath and Other (See Comments)  . Penicillins Anaphylaxis, Swelling and Other (See Comments)    Has patient had a PCN reaction causing immediate rash, facial/tongue/throat swelling, SOB or lightheadedness with hypotension: Yes Has patient had a PCN reaction causing severe rash involving mucus membranes or skin necrosis: Rash Has patient had a PCN reaction that required hospitalization: No Has patient had a PCN reaction occurring within the last 10 years: No If all of the above answers are "NO", then may proceed with Cephalosporin use.  . Sulfa Antibiotics Swelling and Other (See Comments)     Throat swells, but no shortness of breath noted  . Codeine Nausea And Vomiting  . Ecotrin [Aspirin] Nausea And Vomiting  . Zanaflex [Tizanidine] Other (See Comments)    Possible confusion    Metabolic Disorder Labs: Lab Results  Component Value Date   HGBA1C 6.4 (H) 04/28/2015   MPG 137 (H) 04/28/2015   MPG 134 (H) 05/21/2014   No results found for: PROLACTIN Lab Results  Component Value Date   CHOL 195 04/28/2015   TRIG 188 (H) 04/28/2015   HDL 43 (L) 04/28/2015   CHOLHDL 4.5 04/28/2015   VLDL 38 (H) 04/28/2015   LDLCALC 114 04/28/2015   LDLCALC 114 04/28/2015     Current Medications: Current Outpatient Medications  Medication Sig Dispense Refill  . acetaminophen (TYLENOL) 325 MG tablet Take 650 mg by mouth every 6 (six) hours as needed (pain.).    Marland Kitchen albuterol (PROVENTIL) (2.5 MG/3ML) 0.083% nebulizer solution Take 3 mLs (2.5 mg total) by nebulization every 6 (six) hours as needed for wheezing or shortness of breath. 300 mL 11  . amLODipine (NORVASC) 5 MG tablet TAKE ONE TABLET EACH DAY 90 tablet 1  . buPROPion (WELLBUTRIN XL) 150 MG 24 hr tablet Take 2 tablets (300 mg total) by mouth every morning. 60 tablet 2  . donepezil (ARICEPT) 10 MG tablet Take 1 tablet (10 mg total) by mouth at bedtime. 30 tablet 5  . fluticasone (FLONASE) 50 MCG/ACT nasal spray 1 OR 2 SPRAYS IN NOSE ONCE A DAY 16 g 5  . furosemide (LASIX) 20 MG tablet Take 1 tablet (20 mg total) by mouth daily. 30 tablet 0  . gabapentin (NEURONTIN) 100 MG capsule TAKE ONE CAPSULE TWICE A DAY 180  capsule 0  . ipratropium (ATROVENT) 0.02 % nebulizer solution Take 2.5 mLs (0.5 mg total) by nebulization every 6 (six) hours as needed.    Marland Kitchen ipratropium (ATROVENT) 0.03 % nasal spray Place 2 sprays into both nostrils every 12 (twelve) hours.    Marland Kitchen levalbuterol (XOPENEX) 0.63 MG/3ML nebulizer solution Take 3 mLs (0.63 mg total) by nebulization every 4 (four) hours as needed for wheezing or shortness of breath. 360 mL 5  .  levothyroxine (SYNTHROID) 125 MCG tablet Take 1 tablet (125 mcg total) by mouth at bedtime. 90 tablet 0  . lisinopril (ZESTRIL) 5 MG tablet Take 1 tablet (5 mg total) by mouth daily. 90 tablet 3  . loratadine (CLARITIN) 10 MG tablet Take 10 mg by mouth daily.    Marland Kitchen oxyCODONE-acetaminophen (PERCOCET/ROXICET) 5-325 MG tablet Take 1-2 tablets by mouth every 3 (three) hours as needed for moderate pain or severe pain. 30 tablet 0  . pantoprazole (PROTONIX) 40 MG tablet TAKE ONE TABLET DAILY 90 tablet 0  . propranolol (INDERAL) 80 MG tablet TAKE ONE-HALF TABLET 3 TIMES DAILY 135 tablet 1  . sodium chloride 1 g tablet TAKE ONE TABLET THREE TIMES DAILY 180 tablet 0  . temazepam (RESTORIL) 30 MG capsule Take 1 capsule (30 mg total) by mouth at bedtime. 30 capsule 4   No current facility-administered medications for this visit.     Neurologic: Headache: No Seizure: No Paresthesias:No  Musculoskeletal: Strength & Muscle Tone: decreased Gait & Station: unsteady Patient leans: N/A  Psychiatric Specialty Exam: ROS  Blood pressure (!) 148/78, pulse 72, temperature 98.1 F (36.7 C), height 5' 6.5" (1.689 m), weight 197 lb (89.4 kg).Body mass index is 31.32 kg/m.  General Appearance: Casual  Eye Contact:  Good  Speech:  Clear and Coherent  Volume:  Normal  Mood:  NA  Affect:  depressed  Thought Process:  Goal Directed  Orientation:  Full (Time, Place, and Person)  Thought Content:  WDL  Suicidal Thoughts:  No  Homicidal Thoughts:  No  Memory:  Immediate;   Poor  Judgement:  Fair  Insight:  Lacking  Psychomotor Activity:  Normal  Concentration:    Recall:  Poor  Fund of Knowledge:Poor  Language: Fair  Akathisia:  No  Handed:  Right  AIMS (if indicated):   Assets:  Communication Skills  ADL's:  Impaired  Cognition: Impaired,  Moderate  Sleep:      Treatment Plan Summary: The first problem for this patient is that of clinical depression.  She takes Wellbutrin 300 mg and does very  well.  Her second problem is that of insomnia.  Taking Restoril on a regular basis is sleeping through the night without a problem.  Her third problem was that of a dementing process.  I suspect it is Alzheimer's dementia.  Noticed that she had a brain scan that was essentially normal.  Her blood work-up was essentially normal.  At this time her Mini-Mental status and her functioning implies that she has a mild dementing process most likely that of Alzheimer's.  At this time she will continue taking Aricept as prescribed but we will go ahead and discontinue her Namenda.  Patient will return to see me in 4 or 5 months.  At this time the patient is functioning very well with the assistance of her husband [name].

## 2019-02-15 ENCOUNTER — Other Ambulatory Visit: Payer: Self-pay | Admitting: Family Medicine

## 2019-02-18 ENCOUNTER — Telehealth (HOSPITAL_COMMUNITY): Payer: Self-pay | Admitting: *Deleted

## 2019-02-18 NOTE — Telephone Encounter (Signed)
Received message from husband stating that pt has been "acting funny" he thinks in regards to medications change. Please review and advise.

## 2019-02-19 NOTE — Telephone Encounter (Signed)
He also stated that patient is "lost" and doesn't know what to do. He said she's very confused and doesn't even remember eating or doing other daily activities.

## 2019-02-20 ENCOUNTER — Other Ambulatory Visit: Payer: Self-pay

## 2019-02-21 ENCOUNTER — Ambulatory Visit (INDEPENDENT_AMBULATORY_CARE_PROVIDER_SITE_OTHER): Payer: Medicare Other | Admitting: Family Medicine

## 2019-02-21 ENCOUNTER — Encounter: Payer: Self-pay | Admitting: Family Medicine

## 2019-02-21 VITALS — BP 106/68 | HR 60 | Temp 97.8°F | Wt 199.0 lb

## 2019-02-21 DIAGNOSIS — M25511 Pain in right shoulder: Secondary | ICD-10-CM | POA: Diagnosis not present

## 2019-02-21 DIAGNOSIS — R0602 Shortness of breath: Secondary | ICD-10-CM

## 2019-02-21 DIAGNOSIS — E782 Mixed hyperlipidemia: Secondary | ICD-10-CM | POA: Diagnosis not present

## 2019-02-21 DIAGNOSIS — M25562 Pain in left knee: Secondary | ICD-10-CM | POA: Diagnosis not present

## 2019-02-21 DIAGNOSIS — R7303 Prediabetes: Secondary | ICD-10-CM | POA: Diagnosis not present

## 2019-02-21 DIAGNOSIS — L821 Other seborrheic keratosis: Secondary | ICD-10-CM

## 2019-02-21 DIAGNOSIS — F0391 Unspecified dementia with behavioral disturbance: Secondary | ICD-10-CM

## 2019-02-21 DIAGNOSIS — Z0000 Encounter for general adult medical examination without abnormal findings: Secondary | ICD-10-CM

## 2019-02-21 DIAGNOSIS — G8929 Other chronic pain: Secondary | ICD-10-CM

## 2019-02-21 DIAGNOSIS — M25561 Pain in right knee: Secondary | ICD-10-CM

## 2019-02-21 DIAGNOSIS — Z780 Asymptomatic menopausal state: Secondary | ICD-10-CM

## 2019-02-21 DIAGNOSIS — R600 Localized edema: Secondary | ICD-10-CM | POA: Diagnosis not present

## 2019-02-21 LAB — LDL CHOLESTEROL, DIRECT: Direct LDL: 168 mg/dL

## 2019-02-21 LAB — CBC WITH DIFFERENTIAL/PLATELET
Basophils Absolute: 0 10*3/uL (ref 0.0–0.1)
Basophils Relative: 0.3 % (ref 0.0–3.0)
Eosinophils Absolute: 0.2 10*3/uL (ref 0.0–0.7)
Eosinophils Relative: 2.5 % (ref 0.0–5.0)
HCT: 42.5 % (ref 36.0–46.0)
Hemoglobin: 14.3 g/dL (ref 12.0–15.0)
Lymphocytes Relative: 31.7 % (ref 12.0–46.0)
Lymphs Abs: 2 10*3/uL (ref 0.7–4.0)
MCHC: 33.5 g/dL (ref 30.0–36.0)
MCV: 88.2 fl (ref 78.0–100.0)
Monocytes Absolute: 0.5 10*3/uL (ref 0.1–1.0)
Monocytes Relative: 8.5 % (ref 3.0–12.0)
Neutro Abs: 3.7 10*3/uL (ref 1.4–7.7)
Neutrophils Relative %: 57 % (ref 43.0–77.0)
Platelets: 350 10*3/uL (ref 150.0–400.0)
RBC: 4.82 Mil/uL (ref 3.87–5.11)
RDW: 15.2 % (ref 11.5–15.5)
WBC: 6.4 10*3/uL (ref 4.0–10.5)

## 2019-02-21 LAB — BASIC METABOLIC PANEL
BUN: 12 mg/dL (ref 6–23)
CO2: 31 mEq/L (ref 19–32)
Calcium: 9.8 mg/dL (ref 8.4–10.5)
Chloride: 97 mEq/L (ref 96–112)
Creatinine, Ser: 1.13 mg/dL (ref 0.40–1.20)
GFR: 46.89 mL/min — ABNORMAL LOW (ref >60.00)
Glucose, Bld: 125 mg/dL — ABNORMAL HIGH (ref 70–99)
Potassium: 4.6 mEq/L (ref 3.5–5.1)
Sodium: 137 mEq/L (ref 135–145)

## 2019-02-21 LAB — LIPID PANEL
Cholesterol: 297 mg/dL — ABNORMAL HIGH (ref 0–200)
HDL: 59.9 mg/dL (ref >39.00)
NonHDL: 237.02
Total CHOL/HDL Ratio: 5
Triglycerides: 264 mg/dL — ABNORMAL HIGH (ref 0.0–149.0)
VLDL: 52.8 mg/dL — ABNORMAL HIGH (ref 0.0–40.0)

## 2019-02-21 LAB — HEMOGLOBIN A1C: Hgb A1c MFr Bld: 7.3 % — ABNORMAL HIGH (ref 4.6–6.5)

## 2019-02-21 NOTE — Telephone Encounter (Signed)
Confirmed namenda with pharmacy and patient's husband.

## 2019-02-21 NOTE — Patient Instructions (Signed)
Dementia Caregiver Guide Dementia is a term used to describe a number of symptoms that affect memory and thinking. The most common symptoms include:  Memory loss.  Trouble with language and communication.  Trouble concentrating.  Poor judgment.  Problems with reasoning.  Child-like behavior and language.  Extreme anxiety.  Angry outbursts.  Wandering from home or public places. Dementia usually gets worse slowly over time. In the early stages, people with dementia can stay independent and safe with some help. In later stages, they need help with daily tasks such as dressing, grooming, and using the bathroom. How to help the person with dementia cope Dementia can be frightening and confusing. Here are some tips to help the person with dementia cope with changes caused by the disease. General tips  Keep the person on track with his or her routine.  Try to identify areas where the person may need help.  Be supportive, patient, calm, and encouraging.  Gently remind the person that adjusting to changes takes time.  Help with the tasks that the person has asked for help with.  Keep the person involved in daily tasks and decisions as much as possible.  Encourage conversation, but try not to get frustrated or harried if the person struggles to find words or does not seem to appreciate your help. Communication tips  When the person is talking or seems frustrated, make eye contact and hold the person's hand.  Ask specific questions that need yes or no answers.  Use simple words, short sentences, and a calm voice. Only give one direction at a time.  When offering choices, limit them to just 1 or 2.  Avoid correcting the person in a negative way.  If the person is struggling to find the right words, gently try to help him or her. How to recognize symptoms of stress Symptoms of stress in caregivers include:  Feeling frustrated or angry with the person with  dementia.  Denying that the person has dementia or that his or her symptoms will not improve.  Feeling hopeless and unappreciated.  Difficulty sleeping.  Difficulty concentrating.  Feeling anxious, irritable, or depressed.  Developing stress-related health problems.  Feeling like you have too little time for your own life. Follow these instructions at home:   Make sure that you and the person you are caring for: ? Get regular sleep. ? Exercise regularly. ? Eat regular, nutritious meals. ? Drink enough fluid to keep your urine clear or pale yellow. ? Take over-the-counter and prescription medicines only as told by your health care providers. ? Attend all scheduled health care appointments.  Join a support group with others who are caregivers.  Ask about respite care resources so that you can have a regular break from the stress of caregiving.  Look for signs of stress in yourself and in the person you are caring for. If you notice signs of stress, take steps to manage it.  Consider any safety risks and take steps to avoid them.  Organize medications in a pill box for each day of the week.  Create a plan to handle any legal or financial matters. Get legal or financial advice if needed.  Keep a calendar in a central location to remind the person of appointments or other activities. Tips for reducing the risk of injury  Keep floors clear of clutter. Remove rugs, magazine racks, and floor lamps.  Keep hallways well lit, especially at night.  Put a handrail and nonslip mat in the bathtub or  shower.  Put childproof locks on cabinets that contain dangerous items, such as medicines, alcohol, guns, toxic cleaning items, sharp tools or utensils, matches, and lighters.  Put the locks in places where the person cannot see or reach them easily. This will help ensure that the person does not wander out of the house and get lost.  Be prepared for emergencies. Keep a list of  emergency phone numbers and addresses in a convenient area.  Remove car keys and lock garage doors so that the person does not try to get in the car and drive.  Have the person wear a bracelet that tracks locations and identifies the person as having memory problems. This should be worn at all times for safety. Where to find support: Many individuals and organizations offer support. These include:  Support groups for people with dementia and for caregivers.  Counselors or therapists.  Home health care services.  Adult day care centers. Where to find more information [employer]: [employer] Contact a health care provider if:  The person's health is rapidly getting worse.  You are no longer able to care for the person.  Caring for the person is affecting your physical and emotional health.  The person threatens himself or herself, you, or anyone else. Summary  Dementia is a term used to describe a number of symptoms that affect memory and thinking.  Dementia usually gets worse slowly over time.  Take steps to reduce the person's risk of injury, and to plan for future care.  Caregivers need support, relief from caregiving, and time for their own lives. This information is not intended to replace advice given to you by your health care provider. Make sure you discuss any questions you have with your health care provider. Document Released: 02/09/2016 Document Revised: 02/17/2017 Document Reviewed: 02/09/2016 Elsevier Patient Education  2020 [place].  Seborrheic Keratosis A seborrheic keratosis is a common, noncancerous (benign) skin growth. These growths are velvety, waxy, rough, tan, brown, or black spots that appear on the skin. These skin growths can be flat or raised, and scaly. What are the causes? The cause of this condition is not known. What increases the risk? You are more likely to develop this condition if you:  Have a family history of  seborrheic keratosis.  Are 50 or older.  Are pregnant.  Have had estrogen replacement therapy. What are the signs or symptoms? Symptoms of this condition include growths on the face, chest, shoulders, back, or other areas. These growths:  Are usually painless, but may become irritated and itchy.  Can be yellow, brown, black, or other colors.  Are slightly raised or have a flat surface.  Are sometimes rough or wart-like in texture.  Are often velvety or waxy on the surface.  Are round or oval-shaped.  Often occur in groups, but may occur as a single growth. How is this diagnosed? This condition is diagnosed with a medical history and physical exam.  A sample of the growth may be tested (skin biopsy).  You may need to see a skin specialist (dermatologist). How is this treated? Treatment is not usually needed for this condition, unless the growths are irritated or bleed often.  You may also choose to have the growths removed if you do not like their appearance. ? Most commonly, these growths are treated with a procedure in which liquid nitrogen is applied to "freeze" off the growth (cryosurgery). ? They may also be burned off with electricity (electrocautery) or removed by scraping (  curettage). Follow these instructions at home:  Watch your growth for any changes.  Keep all follow-up visits as told by your health care provider. This is important.  Do not scratch or pick at the growth or growths. This can cause them to become irritated or infected. Contact a health care provider if:  You suddenly have many new growths.  Your growth bleeds, itches, or hurts.  Your growth suddenly becomes larger or changes color. Summary  A seborrheic keratosis is a common, noncancerous (benign) skin growth.  Treatment is not usually needed for this condition, unless the growths are irritated or bleed often.  Watch your growth for any changes.  Contact a health care provider if you  suddenly have many new growths or your growth suddenly becomes larger or changes color.  Keep all follow-up visits as told by your health care provider. This is important. This information is not intended to replace advice given to you by your health care provider. Make sure you discuss any questions you have with your health care provider. Document Released: 04/09/2010 Document Revised: 07/20/2017 Document Reviewed: 07/20/2017 Elsevier Patient Education  2020 [place].  Shoulder Pain Many things can cause shoulder pain, including:  An injury.  Moving the shoulder in the same way again and again (overuse).  Joint pain (arthritis). Pain can come from:  Swelling and irritation (inflammation) of any part of the shoulder.  An injury to the shoulder joint.  An injury to: ? Tissues that connect muscle to bone (tendons). ? Tissues that connect bones to each other (ligaments). ? Bones. Follow these instructions at home: Watch for changes in your symptoms. Let your doctor know about them. Follow these instructions to help with your pain. If you have a sling:  Wear the sling as told by your doctor. Remove it only as told by your doctor.  Loosen the sling if your fingers: ? Tingle. ? Become numb. ? Turn cold and blue.  Keep the sling clean.  If the sling is not waterproof: ? Do not let it get wet. ? Take the sling off when you shower or bathe. Managing pain, stiffness, and swelling   If told, put ice on the painful area: ? Put ice in a plastic bag. ? Place a towel between your skin and the bag. ? Leave the ice on for 20 minutes, 2-3 times a day. Stop putting ice on if it does not help with the pain.  Squeeze a soft ball or a foam pad as much as possible. This prevents swelling in the shoulder. It also helps to strengthen the arm. General instructions  Take over-the-counter and prescription medicines only as told by your doctor.  Keep all follow-up visits as told by  your doctor. This is important. Contact a doctor if:  Your pain gets worse.  Medicine does not help your pain.  You have new pain in your arm, hand, or fingers. Get help right away if:  Your arm, hand, or fingers: ? Tingle. ? Are numb. ? Are swollen. ? Are painful. ? Turn white or blue. Summary  Shoulder pain can be caused by many things. These include injury, moving the shoulder in the same away again and again, and joint pain.  Watch for changes in your symptoms. Let your doctor know about them.  This condition may be treated with a sling, ice, and pain medicine.  Contact your doctor if the pain gets worse or you have new pain. Get help right away if your arm,  hand, or fingers tingle or get numb, swollen, or painful.  Keep all follow-up visits as told by your doctor. This is important. This information is not intended to replace advice given to you by your health care provider. Make sure you discuss any questions you have with your health care provider. Document Released: 08/24/2007 Document Revised: 09/19/2017 Document Reviewed: 09/19/2017 Elsevier Patient Education  [place] of Breath, Adult Shortness of breath means you have trouble breathing. Shortness of breath could be a sign of a medical problem. Follow these instructions at home:   Watch for any changes in your symptoms.  Do not use any products that contain nicotine or tobacco, such as cigarettes, e-cigarettes, and chewing tobacco.  Do not smoke. Smoking can cause shortness of breath. If you need help to quit smoking, ask your doctor.  Avoid things that can make it harder to breathe, such as: ? Mold. ? Dust. ? Air pollution. ? Chemical smells. ? Things that can cause allergy symptoms (allergens), if you have allergies.  Keep your living space clean. Use products that help remove mold and dust.  Rest as needed. Slowly return to your normal activities.  Take over-the-counter and  prescription medicines only as told by your doctor. This includes oxygen therapy and inhaled medicines.  Keep all follow-up visits as told by your doctor. This is important. Contact a doctor if:  Your condition does not get better as soon as expected.  You have a hard time doing your normal activities, even after you rest.  You have new symptoms. Get help right away if:  Your shortness of breath gets worse.  You have trouble breathing when you are resting.  You feel light-headed or you pass out (faint).  You have a cough that is not helped by medicines.  You cough up blood.  You have pain with breathing.  You have pain in your chest, arms, shoulders, or belly (abdomen).  You have a fever.  You cannot walk up stairs.  You cannot exercise the way you normally do. These symptoms may represent a serious problem that is an emergency. Do not wait to see if the symptoms will go away. Get medical help right away. Call your local emergency services (911 in the [place] Do not drive yourself to the hospital. Summary  Shortness of breath is when you have trouble breathing enough air. It can be a sign of a medical problem.  Avoid things that make it hard for you to breathe, such as smoking, pollution, mold, and dust.  Watch for any changes in your symptoms. Contact your doctor if you do not get better or you get worse. This information is not intended to replace advice given to you by your health care provider. Make sure you discuss any questions you have with your health care provider. Document Released: 08/24/2007 Document Revised: 08/07/2017 Document Reviewed: 08/07/2017 Elsevier Patient Education  2020 [employer].

## 2019-02-26 ENCOUNTER — Encounter: Payer: Self-pay | Admitting: Family Medicine

## 2019-02-26 NOTE — Progress Notes (Signed)
Subjective:    Destiny Hughes is a 73 y.o. female who presents for Medicare Annual/Subsequent preventive examination.  Pt accompanied by her husband.  Overall pt has been doing well.  Still having nasal drainage.  Was seen by ENT.  On Aricept 10 mg for dementia.  Followed by Pulm for h/o COPD/Emphysema.  Stellato recently d/c'd.  Pt using nebulizer, but notes occasional SOB.  Pt with LE edema, mostly in knees.  Pt also with h/o OA causing knee pain.  Had xrays in 2019 at [facility].  Per pt's husband she has a skin lesion on L shoulder.  Area is not red, non painful, no drainage.  Preventive Screening-Counseling & Management  Tobacco Social History   Tobacco Use  Smoking Status Former Smoker  . Packs/day: 0.75  . Years: 50.00  . Pack years: 37.50  . Types: Cigarettes  . Start date: 09/13/1961  . Quit date: 05/2016  . Years since quitting: 2.7  Smokeless Tobacco Never Used  Tobacco Comment   Decreased Down to 2/3 a Day if Thinking Abouit It     Current Problems (verified) Patient Active Problem List   Diagnosis Date Noted  . Spinal cord stimulator dysfunction ([place]) 06/04/2018  . Chronic cough 05/21/2018  . Tremor 02/16/2018  . Chronic rhinitis 02/05/2018  . Hallucinations   . Psychosis ([place]) 11/07/2016  . Acute respiratory failure with hypoxia and hypercapnia ([place]) 11/03/2016  . Other chest pain 10/23/2016  . Tobacco abuse 10/23/2016  . Major depression with psychotic features ([place]) 10/19/2016  . Dementia with behavioral disturbance ([place]) 10/19/2016  . Depression due to physical illness   . Cognitive retention disorder   . Atrial flutter ([place]) 08/15/2016  . Paroxysmal atrial fibrillation ([place]) 08/15/2016  . SVT (supraventricular tachycardia) ([place]) 07/11/2016  . Weight loss 07/08/2016  . AKI (acute kidney injury) ([place]) 07/02/2016  . Acute kidney injury ([place]) 07/01/2016  . Hyperglycemia 07/01/2016  . Hyponatremia 07/01/2016  . Intractable nausea and vomiting  07/01/2016  . Abnormal EKG 07/01/2016  . Diastolic CHF ([place]) [id number]  . Medication management 04/28/2015  . Emphysema lung ([place]) 12/26/2014  . Gastroesophageal reflux disease without esophagitis 12/26/2014  . Chronic bronchitis ([place]) 11/13/2014  . COPD (chronic obstructive pulmonary disease) ([place]) 10/03/2014  . At high risk for falls 10/03/2014  . Other specified hypothyroidism 10/03/2014  . Mitral valve prolapse 10/03/2014  . Encounter for Medicare annual wellness exam 10/03/2014  . IBS (irritable bowel syndrome) 04/01/2014  . S/P total knee replacement 04/22/2013  . Knee pain, chronic 04/22/2013  . Essential hypertension 04/15/2013  . Vitamin D deficiency 04/15/2013  . Encounter for long-term (current) use of medications 04/15/2013  . Prediabetes 04/15/2013  . Osteoarthritis 01/31/2011  . Hyperlipemia   . Glaucoma   . Chronic pain disorder   . Chronic back pain     Medications Prior to Visit Current Outpatient Medications on File Prior to Visit  Medication Sig Dispense Refill  . acetaminophen (TYLENOL) 325 MG tablet Take 650 mg by mouth every 6 (six) hours as needed (pain.).    Marland Kitchen albuterol (PROVENTIL) (2.5 MG/3ML) 0.083% nebulizer solution Take 3 mLs (2.5 mg total) by nebulization every 6 (six) hours as needed for wheezing or shortness of breath. 300 mL 11  . amLODipine (NORVASC) 5 MG tablet TAKE ONE TABLET EACH DAY 90 tablet 1  . buPROPion (WELLBUTRIN XL) 150 MG 24 hr tablet Take 2 tablets (300 mg total) by mouth every morning. 60 tablet 6  . donepezil (ARICEPT) 10  MG tablet Take 1 tablet (10 mg total) by mouth at bedtime. 30 tablet 5  . fluticasone (FLONASE) 50 MCG/ACT nasal spray 1 OR 2 SPRAYS IN NOSE ONCE A DAY 16 g 5  . furosemide (LASIX) 20 MG tablet Take 1 tablet (20 mg total) by mouth daily. 30 tablet 0  . gabapentin (NEURONTIN) 100 MG capsule TAKE ONE CAPSULE TWICE A DAY 180 capsule 0  . ipratropium (ATROVENT) 0.02 % nebulizer solution Take 2.5 mLs (0.5 mg total) by  nebulization every 6 (six) hours as needed.    Marland Kitchen ipratropium (ATROVENT) 0.03 % nasal spray Place 2 sprays into both nostrils every 12 (twelve) hours.    Marland Kitchen levalbuterol (XOPENEX) 0.63 MG/3ML nebulizer solution Take 3 mLs (0.63 mg total) by nebulization every 4 (four) hours as needed for wheezing or shortness of breath. 360 mL 5  . levothyroxine (SYNTHROID) 125 MCG tablet Take 1 tablet (125 mcg total) by mouth at bedtime. 90 tablet 0  . lisinopril (ZESTRIL) 5 MG tablet Take 1 tablet (5 mg total) by mouth daily. 90 tablet 3  . loratadine (CLARITIN) 10 MG tablet Take 10 mg by mouth daily.    Marland Kitchen oxyCODONE-acetaminophen (PERCOCET/ROXICET) 5-325 MG tablet Take 1-2 tablets by mouth every 3 (three) hours as needed for moderate pain or severe pain. 30 tablet 0  . pantoprazole (PROTONIX) 40 MG tablet TAKE ONE TABLET DAILY 90 tablet 0  . propranolol (INDERAL) 80 MG tablet TAKE ONE-HALF TABLET 3 TIMES DAILY 135 tablet 1  . sodium chloride 1 g tablet TAKE ONE TABLET THREE TIMES DAILY 180 tablet 0  . temazepam (RESTORIL) 30 MG capsule Take 1 capsule (30 mg total) by mouth at bedtime. 30 capsule 4   No current facility-administered medications on file prior to visit.     Current Medications (verified) Current Outpatient Medications  Medication Sig Dispense Refill  . acetaminophen (TYLENOL) 325 MG tablet Take 650 mg by mouth every 6 (six) hours as needed (pain.).    Marland Kitchen albuterol (PROVENTIL) (2.5 MG/3ML) 0.083% nebulizer solution Take 3 mLs (2.5 mg total) by nebulization every 6 (six) hours as needed for wheezing or shortness of breath. 300 mL 11  . amLODipine (NORVASC) 5 MG tablet TAKE ONE TABLET EACH DAY 90 tablet 1  . buPROPion (WELLBUTRIN XL) 150 MG 24 hr tablet Take 2 tablets (300 mg total) by mouth every morning. 60 tablet 6  . donepezil (ARICEPT) 10 MG tablet Take 1 tablet (10 mg total) by mouth at bedtime. 30 tablet 5  . fluticasone (FLONASE) 50 MCG/ACT nasal spray 1 OR 2 SPRAYS IN NOSE ONCE A DAY 16 g 5   . furosemide (LASIX) 20 MG tablet Take 1 tablet (20 mg total) by mouth daily. 30 tablet 0  . gabapentin (NEURONTIN) 100 MG capsule TAKE ONE CAPSULE TWICE A DAY 180 capsule 0  . ipratropium (ATROVENT) 0.02 % nebulizer solution Take 2.5 mLs (0.5 mg total) by nebulization every 6 (six) hours as needed.    Marland Kitchen ipratropium (ATROVENT) 0.03 % nasal spray Place 2 sprays into both nostrils every 12 (twelve) hours.    Marland Kitchen levalbuterol (XOPENEX) 0.63 MG/3ML nebulizer solution Take 3 mLs (0.63 mg total) by nebulization every 4 (four) hours as needed for wheezing or shortness of breath. 360 mL 5  . levothyroxine (SYNTHROID) 125 MCG tablet Take 1 tablet (125 mcg total) by mouth at bedtime. 90 tablet 0  . lisinopril (ZESTRIL) 5 MG tablet Take 1 tablet (5 mg total) by mouth daily. 90 tablet 3  .  loratadine (CLARITIN) 10 MG tablet Take 10 mg by mouth daily.    Marland Kitchen oxyCODONE-acetaminophen (PERCOCET/ROXICET) 5-325 MG tablet Take 1-2 tablets by mouth every 3 (three) hours as needed for moderate pain or severe pain. 30 tablet 0  . pantoprazole (PROTONIX) 40 MG tablet TAKE ONE TABLET DAILY 90 tablet 0  . propranolol (INDERAL) 80 MG tablet TAKE ONE-HALF TABLET 3 TIMES DAILY 135 tablet 1  . sodium chloride 1 g tablet TAKE ONE TABLET THREE TIMES DAILY 180 tablet 0  . temazepam (RESTORIL) 30 MG capsule Take 1 capsule (30 mg total) by mouth at bedtime. 30 capsule 4   No current facility-administered medications for this visit.      Allergies (verified) Albuterol, Penicillins, Sulfa antibiotics, Codeine, Ecotrin [aspirin], and Zanaflex [tizanidine]   PAST HISTORY  Family History Family History  Problem Relation Age of Onset  . Heart attack Father   . Emphysema Father   . Aneurysm Mother        CEREBRAL  . Emphysema Mother   . Dementia Mother   . Diabetes Son   . Hypertension Son   . Hypertension Son   . Cancer Sister        Widely Metastatic  . Asthma Son        x2  . Breast cancer Paternal Aunt   . Rheum  arthritis Maternal Aunt     Social History Social History   Tobacco Use  . Smoking status: Former Smoker    Packs/day: 0.75    Years: 50.00    Pack years: 37.50    Types: Cigarettes    Start date: 09/13/1961    Quit date: 05/2016    Years since quitting: 2.7  . Smokeless tobacco: Never Used  . Tobacco comment: Decreased Down to 2/3 a Day if Thinking Abouit It  Substance Use Topics  . Alcohol use: No    Alcohol/week: 0.0 standard drinks    Comment: none in 10 years     Risk Factors Current exercise habits: The patient does not participate in regular exercise at present.  Dietary issues discussed: balanced diet   Cardiac risk factors: advanced age (older than 12 for men, 67 for women), dyslipidemia, hypertension, obesity (BMI >= 30 kg/m2), sedentary lifestyle and smoking/ tobacco exposure.  Depression Screen (Note: if answer to either of the following is "Yes", a more complete depression screening is indicated)   Over the past two weeks, have you felt down, depressed or hopeless? No  Over the past two weeks, have you felt little interest or pleasure in doing things? No  Have you lost interest or pleasure in daily life? No  Do you often feel hopeless? No  Do you cry easily over simple problems? No  Activities of Daily Living In your present state of health, do you have any difficulty performing the following activities?:  Driving? Pt is not driving Managing money?  Unable to manage finances 2/2 dementia Feeding yourself? No Getting from bed to chair? No  Climbing a flight of stairs? Yes Preparing food and eating?: may require assistance preparing food Bathing or showering? Needs some assistance Getting dressed: No Getting to the toilet? No Using the toilet:No Moving around from place to place: No In the past year have you fallen or had a near fall?:No   Do you have more than one partner? no  Hearing Difficulties: No Do you often ask people to speak up or repeat  themselves? At times Do you experience ringing or noises in your  ears? No Do you have difficulty understanding soft or whispered voices? No   Do you feel that you have a problem with memory? Yes  Do you feel safe at home?  Yes  Cognitive Testing  Alert? Yes  Normal Appearance?Yes  Oriented to person? Yes  Place? Yes   Time? No  Recall of three objects?  No  Can perform simple calculations? No  Displays appropriate judgment?Yes    List the Names of Other Physician/Practitioners you currently use: 1.  Cardiology-Dr. Cristopher Peru and Dr. Darlin Coco 2.  Orthopedics-Dr. Paralee Cancel 3.  Gastroenterologist Dr. Earle Gell  Indicate any recent Medical Services you may have received from other than [facility] providers in the past year (date may be approximate).  Immunization History  Administered Date(s) Administered  . Fluad Quad(high Dose 65+) 12/17/2018  . Influenza, High Dose Seasonal PF 04/13/2017, 12/05/2017  . Pneumococcal Conjugate-13 10/03/2014  . Pneumococcal Polysaccharide-23 10/18/2011  . Td 03/21/2010    Screening Tests Health Maintenance  Topic Date Due  . Hepatitis C Screening  May 08, 1945  . DEXA SCAN  09/13/2008  . TETANUS/TDAP  03/21/2020  . COLONOSCOPY  07/24/2022  . INFLUENZA VACCINE  Completed  . PNA vac Low Risk Adult  Completed   All answers were reviewed with the patient and necessary referrals were made:  Billie Ruddy, MD   02/26/2019   History reviewed: allergies, current medications, past family history, past medical history, past social history, past surgical history and problem list  Review of Systems Pertinent items noted in HPI and remainder of comprehensive ROS otherwise negative.    Objective:      Body mass index is 31.64 kg/m. BP 106/68 (BP Location: Left Arm, Patient Position: Sitting, Cuff Size: Large)   Pulse 60   Temp 97.8 F (36.6 C) (Temporal)   Wt 199 lb (90.3 kg)   SpO2 96%   BMI 31.64 kg/m   BP 106/68 (BP  Location: Left Arm, Patient Position: Sitting, Cuff Size: Large)   Pulse 60   Temp 97.8 F (36.6 C) (Temporal)   Wt 199 lb (90.3 kg)   SpO2 96%   BMI 31.64 kg/m  General appearance: alert, cooperative and no distress Head: Normocephalic, without obvious abnormality, atraumatic Eyes: conjunctivae/corneas clear. PERRL, EOM's intact. Fundi benign. Ears: normal TM's and external ear canals both ears Nose: Nares normal. Septum midline. Mucosa normal. No drainage or sinus tenderness. Throat: lips, mucosa, and tongue normal; teeth and gums normal Neck: no adenopathy, no carotid bruit, no JVD, supple, symmetrical, trachea midline and thyroid not enlarged, symmetric, no tenderness/mass/nodules Lungs: clear to auscultation bilaterally Heart: regular rate and rhythm, S1, S2 normal, no murmur, click, rub or gallop Abdomen: soft, non-tender; bowel sounds normal; no masses,  no organomegaly Extremities: Moves all four.  b/l knees with mild edema, no effusion.  Upper legs >>lower legs below knee.  no pitting edema noted. no erythema or induration.  TTP of lateral joint line of b/l knees Pulses: 2+ and symmetric Skin: warm, dry, intact.  dry, hypertrophic, stuck on apperaing lesion on L anterior shoulder/upper chest without erythema, induraiton, or drainage.  non tender Lymph nodes: Cervical, supraclavicular, and axillary nodes normal. Neurologic: alert, oriented to person and place.  Slowed gait.     Assessment:     Pt is a 73 yo female with numerous chronic conditions including dementia seen for f/u and  AWV.     Plan:      Bilateral lower extremity edema  -discussed elevating  LEs when sitting, TED hose, decreasing sodium intake - Plan: Basic Metabolic Panel  Chronic right shoulder pain  -continue f/u with Ortho -Consider OTC Voltaren gel as needed - Plan: CBC with Differential/Platelet  Seborrheic keratosis -Located in left anterior shoulder -Given handout -Discussed options for  removal.  Patient and husband declined at this time  SOB (shortness of breath) -PO2 96% on RA while sitting -6-minute walk test -For continued symptoms follow-up with pulmonology -Continue current medications including ipratropium nebulizer, albuterol nebulizer, and inhaler  Dementia with behavioral disturbance, unspecified dementia type (HCC) -Stable -Continue Aricept 10 mg  Mixed hyperlipidemia - Plan: Lipid Panel  Chronic pain of both knees -2/2 OA -OTC Voltaren gel as needed -Discussed continuing follow-up with orthopedics - Plan: CBC with Differential/Platelet  Prediabetes  - Plan: Hemoglobin A1c  Post-menopausal  - Plan: DG Bone Density  During the course of the visit the patient was educated and counseled about appropriate screening and preventive services including:    Screening mammography  Bone densitometry screening  Nutrition counseling   Advanced directives  Diet review for nutrition referral? Yes ____  Not Indicated _x___   Patient Instructions (the written plan) was given to the patient.  Medicare Attestation I have personally reviewed: The patient's medical and social history Their use of alcohol, tobacco or illicit drugs Their current medications and supplements The patient's functional ability including ADLs,fall risks, home safety risks, cognitive, and hearing and visual impairment Diet and physical activities Evidence for depression or mood disorders  The patient's weight, height, BMI, and visual acuity have been recorded in the chart.  I have made referrals, counseling, and provided education to the patient based on review of the above and I have provided the patient with a written personalized care plan for preventive services.     Billie Ruddy, MD   02/26/2019

## 2019-03-01 ENCOUNTER — Telehealth: Payer: Self-pay | Admitting: Emergency Medicine

## 2019-03-01 NOTE — Telephone Encounter (Signed)
Dr. [name], this patient was last seen by you 01/07/19. The Stiolto is not on her current medication list. But listed in your office note for her to continue using 2 puffs daily.  Can this be sent in with refills?  Below is a cut/paste from your office note:  "We will continue Stiolto 2 puffs once daily.  Take 2 puffs at the same time every day as your maintenance dose.  Do not take at any other times during the day. Please use albuterol nebulizer up to every 4 hours if you need it for shortness of breath, chest tightness, wheezing during the day.  You can take this on an as-needed basis.  We will work on getting you nebulizer supplies through your medical equipment company. Walking oxygen test today to see if you qualify for supplemental oxygen Please continue loratadine 10 mg daily. Follow with Dr [name] in 6 months or sooner if you have any problems"  Thank you.

## 2019-03-04 MED ORDER — STIOLTO RESPIMAT 2.5-2.5 MCG/ACT IN AERS: 2.0000 | INHALATION_SPRAY | Freq: Every day | RESPIRATORY_TRACT | 3 refills | Status: DC

## 2019-03-04 NOTE — Telephone Encounter (Signed)
Spoke with patient's husband. He stated the refill will need to go to [facility]. Advised him that I would go ahead and send in the RX now.   Nothing further needed at time of call.

## 2019-03-04 NOTE — Telephone Encounter (Signed)
Yes she should continue it. Thanks

## 2019-03-12 ENCOUNTER — Telehealth: Payer: Self-pay | Admitting: Emergency Medicine

## 2019-03-12 MED ORDER — STIOLTO RESPIMAT 2.5-2.5 MCG/ACT IN AERS: 2.0000 | INHALATION_SPRAY | Freq: Every day | RESPIRATORY_TRACT | 0 refills | Status: DC

## 2019-03-12 NOTE — Telephone Encounter (Signed)
Spoke with pt's spouse, received a letter from [facility] cares stating that pt's patient assistance for Stiolto cannot be processed at this time.  Pt is requesting additional assistance to see what else can be done, as pt cannot afford this medication.   [place] phone number: [phone]  I have placed 2 samples of Stiolto up front for pickup while we try to figure this out.  Spouse is appreciative of this.  Routing to our pharmacy staff to see if they can help figure out what is needed to get patient assistance for Stiolto.  Thanks!

## 2019-03-13 NOTE — Telephone Encounter (Signed)
Called [employer].  The do not have a renewal application on file.  Called and spoke to [place].    He states they have never had to fill out paperwork and our office handles it.  Advised they may have signed at an office appointment and in order to receive financial assistance they will need to reapply yearly.     Application placed with samples and [name] will come and pick up today.  Patient will fill out application and mail to the manufacturer.   Mariella Saa, PharmD, [place], [place] Clinical Specialty Pharmacist [phone]  03/13/2019 9:19 AM

## 2019-03-21 ENCOUNTER — Other Ambulatory Visit: Payer: Self-pay | Admitting: Family Medicine

## 2019-03-28 ENCOUNTER — Other Ambulatory Visit: Payer: Self-pay | Admitting: Family Medicine

## 2019-04-01 ENCOUNTER — Other Ambulatory Visit: Payer: Self-pay | Admitting: Family Medicine

## 2019-04-01 DIAGNOSIS — R600 Localized edema: Secondary | ICD-10-CM

## 2019-04-08 ENCOUNTER — Other Ambulatory Visit: Payer: Self-pay | Admitting: Family Medicine

## 2019-04-17 ENCOUNTER — Ambulatory Visit: Payer: Self-pay

## 2019-04-19 ENCOUNTER — Other Ambulatory Visit: Payer: Self-pay | Admitting: Family Medicine

## 2019-04-23 ENCOUNTER — Encounter: Payer: Self-pay | Admitting: Orthopaedic Surgery

## 2019-04-23 ENCOUNTER — Ambulatory Visit (INDEPENDENT_AMBULATORY_CARE_PROVIDER_SITE_OTHER): Payer: Medicare Other | Admitting: Orthopaedic Surgery

## 2019-04-23 ENCOUNTER — Ambulatory Visit (INDEPENDENT_AMBULATORY_CARE_PROVIDER_SITE_OTHER): Payer: Medicare Other

## 2019-04-23 ENCOUNTER — Other Ambulatory Visit: Payer: Self-pay

## 2019-04-23 VITALS — Ht 66.0 in | Wt 190.0 lb

## 2019-04-23 DIAGNOSIS — M7989 Other specified soft tissue disorders: Secondary | ICD-10-CM

## 2019-04-23 DIAGNOSIS — M79662 Pain in left lower leg: Secondary | ICD-10-CM

## 2019-04-23 DIAGNOSIS — M79661 Pain in right lower leg: Secondary | ICD-10-CM

## 2019-04-23 DIAGNOSIS — M542 Cervicalgia: Secondary | ICD-10-CM

## 2019-04-23 DIAGNOSIS — G8929 Other chronic pain: Secondary | ICD-10-CM | POA: Diagnosis not present

## 2019-04-23 DIAGNOSIS — M25561 Pain in right knee: Secondary | ICD-10-CM

## 2019-04-23 NOTE — Progress Notes (Signed)
Office Visit Note   Patient: Destiny Hughes           Date of Birth: 24-Jul-1945           MRN: [id number] Visit Date: 04/23/2019              Requested by: Billie Ruddy, MD [place],  [place] [address] PCP: Billie Ruddy, MD   Assessment & Plan: Visit Diagnoses:  1. Neck pain   2. Chronic pain of right knee     Plan: Patient had previous injection in her knee in October that gave her a little bit of relief.  She states it did not last really long.  She still using a walker when she ambulates.  Neck pain with solid fusion C4-C7.  She has bilateral lower extremity edema we will check a Doppler test make sure she does not have DVT.  Previous cervical CT scan shows solid fusion as well as plain radiographs obtained today.  With her past history of problems with right total knee arthroplasty necessitating revision surgery x2 and still no relief she is not really interested in left total knee arthroplasty and she and I agree with this as well as her husband.  We will do a Doppler test make sure there is no DVT and she can follow-up as needed and continue to ambulate using cane or walker for fall prevention.   Follow-Up Instructions: No follow-ups on file.   Orders:  Orders Placed This Encounter  Procedures  . XR Knee 1-2 Views Right  . XR Cervical Spine 2 or 3 views   No orders of the defined types were placed in this encounter.     Procedures: No procedures performed   Clinical Data: No additional findings.   Subjective: Chief Complaint  Patient presents with  . Right Knee - Pain  . Neck - Pain    HPI 74 year old female returns with ongoing problems with left knee pain.  Previous injection 01/02/2018 for synovitis and knee osteoarthritis gave her some relief.  Original total knee arthroplasty done by me several years ago with persistent problems with pain.  She is undergone 2 revision surgeries and still has persistent pain in her right knee.   She has had bilateral lower extremity swelling.  No history of DVT she not had a Doppler test to rule out DVT.  Patient has persistent problems with heart failure emphysema, COPD depression dementia.  She has been in chronic pain management since 2018.  Patient is in a wheelchair today.  Patient has her husband present today and included discussion.  Review of Systems positive for IBS with symptoms, COPD, hypertension, chronic back pain otherwise negative other than as mentioned HPI.   Objective: Vital Signs: Ht 5\' 6"  (1.676 m)   Wt 190 lb (86.2 kg)   BMI 30.67 kg/m   Physical Exam Constitutional:      Appearance: She is well-developed.  HENT:     Head: Normocephalic.     Right Ear: External ear normal.     Left Ear: External ear normal.  Eyes:     Pupils: Pupils are equal, round, and reactive to light.  Neck:     Thyroid: No thyromegaly.     Trachea: No tracheal deviation.  Cardiovascular:     Rate and Rhythm: Normal rate.  Pulmonary:     Effort: Pulmonary effort is normal.  Abdominal:     Palpations: Abdomen is soft.  Skin:  General: Skin is warm and dry.  Neurological:     Mental Status: She is alert and oriented to person, place, and time.  Psychiatric:        Behavior: Behavior normal.     Ortho Exam patient has some tenderness paraspinal muscles cervical spine.  Specialty Comments:  No specialty comments available.  Imaging: No results found.   PMFS History: Patient Active Problem List   Diagnosis Date Noted  . Spinal cord stimulator dysfunction ([place]) 06/04/2018  . Chronic cough 05/21/2018  . Tremor 02/16/2018  . Chronic rhinitis 02/05/2018  . Hallucinations   . Psychosis ([place]) 11/07/2016  . Acute respiratory failure with hypoxia and hypercapnia ([place]) 11/03/2016  . Other chest pain 10/23/2016  . Tobacco abuse 10/23/2016  . Major depression with psychotic features ([place]) 10/19/2016  . Dementia with behavioral disturbance ([place]) 10/19/2016  .  Depression due to physical illness   . Cognitive retention disorder   . Atrial flutter ([place]) 08/15/2016  . Paroxysmal atrial fibrillation ([place]) 08/15/2016  . SVT (supraventricular tachycardia) ([place]) 07/11/2016  . Weight loss 07/08/2016  . AKI (acute kidney injury) ([place]) 07/02/2016  . Acute kidney injury ([place]) 07/01/2016  . Hyperglycemia 07/01/2016  . Hyponatremia 07/01/2016  . Intractable nausea and vomiting 07/01/2016  . Abnormal EKG 07/01/2016  . Diastolic CHF ([place]) [id number]  . Medication management 04/28/2015  . Emphysema lung ([place]) 12/26/2014  . Gastroesophageal reflux disease without esophagitis 12/26/2014  . Chronic bronchitis ([place]) 11/13/2014  . COPD (chronic obstructive pulmonary disease) ([place]) 10/03/2014  . At high risk for falls 10/03/2014  . Other specified hypothyroidism 10/03/2014  . Mitral valve prolapse 10/03/2014  . Encounter for Medicare annual wellness exam 10/03/2014  . IBS (irritable bowel syndrome) 04/01/2014  . S/P total knee replacement 04/22/2013  . Knee pain, chronic 04/22/2013  . Essential hypertension 04/15/2013  . Vitamin D deficiency 04/15/2013  . Encounter for long-term (current) use of medications 04/15/2013  . Prediabetes 04/15/2013  . Osteoarthritis 01/31/2011  . Hyperlipemia   . Glaucoma   . Chronic pain disorder   . Chronic back pain    Past Medical History:  Diagnosis Date  . Arthritis    OA- knees & back  neck  . Chronic back pain   . Cognitive retention disorder   . Collapse of right lung   . COPD (chronic obstructive pulmonary disease) (HCC)    Hx. Bronchitis occ, 01-25-11 somes issue now taking  Z-pak-started 01-24-11/Scar tissue  present  from previous lung collapse  . Dementia with psychosis ([place])   . Depression   . Depression   . Dyspnea   . Dysrhythmia 2018   treated with Ablation   . GERD (gastroesophageal reflux disease)    tx. TUMS  . Glaucoma   . Headache   . History of kidney stones    surgically treated  .  Hyperlipemia   . Hypertension   . Hypothyroidism    tx. Levothyroxine  . Major depression with psychotic features ([place])   . Nephrolithiasis   . Neuromuscular disorder ([place]) 01-25-11   Pain/nerve stimulator implanted -2 yrs ago.  Marland Kitchen Reflex, gag, absent   . Reflex, gag, absent     Family History  Problem Relation Age of Onset  . Heart attack Father   . Emphysema Father   . Aneurysm Mother        CEREBRAL  . Emphysema Mother   . Dementia Mother   . Diabetes Son   . Hypertension Son   . Hypertension  Son   . Cancer Sister        Widely Metastatic  . Asthma Son        x2  . Breast cancer Paternal Aunt   . Rheum arthritis Maternal Aunt     Past Surgical History:  Procedure Laterality Date  . A-FLUTTER ABLATION N/A 08/16/2016   Procedure: A-Flutter Ablation;  Surgeon: Evans Lance, MD;  Location: [facility];  Service: Cardiovascular;  Laterality: N/A;  . ANTERIOR CERVICAL DECOMP/DISCECTOMY FUSION     3 levels, per family  . APPENDECTOMY  01-25-11  . CARPAL TUNNEL RELEASE  01-25-11   Bil.  . CERVICAL SPINE SURGERY  01-25-11   2005-multiple levels  . CHOLECYSTECTOMY  01-25-11   '07  . COLONOSCOPY WITH PROPOFOL N/A 07/23/2012   Procedure: COLONOSCOPY WITH PROPOFOL;  Surgeon: Garlan Fair, MD;  Location: [facility];  Service: Endoscopy;  Laterality: N/A;  . detatched retna Bilateral    done July & August- 2019  . EYE SURGERY Bilateral 2019   repair of retina detachment- Dr. Zadie Rhine at surgical center   . JOINT REPLACEMENT  01-25-11   6'03 LTHA-hemi  . KNEE ARTHROPLASTY  01-25-11   '04-right, revised x2  . no gag reflex     Pt does not havea gag reflex following  siurgery to neck. Family unsure of date. Pt takes pill in apple sauce.  . SPINAL CORD STIMULATOR BATTERY EXCHANGE N/A 01/12/2018   Procedure: Spinal cord stimulator battery replacement;  Surgeon: Clydell Hakim, MD;  Location: [facility];  Service: Neurosurgery;  Laterality: N/A;  left  . SPINAL CORD STIMULATOR  IMPLANT  2009 APPROX   DR. ELSNER  . SPINAL CORD STIMULATOR REMOVAL N/A 06/04/2018   Procedure: Removal of spinal cord stimulator, thoracic paddle and generator;  Surgeon: Kristeen Miss, MD;  Location: [facility];  Service: Neurosurgery;  Laterality: N/A;  . THORACIC SPINE SURGERY  01-25-11   6'02- then nerve stimulator implanted after  . TOTAL KNEE REVISION  02/01/2011   Procedure: TOTAL KNEE REVISION;  Surgeon: Mauri Pole;  Location: [facility];  Service: Orthopedics;  Laterality: Right;  . TUBAL LIGATION     Social History   Occupational History  . Occupation: disabled  Tobacco Use  . Smoking status: Former Smoker    Packs/day: 0.75    Years: 50.00    Pack years: 37.50    Types: Cigarettes    Start date: 09/13/1961    Quit date: 05/2016    Years since quitting: 2.9  . Smokeless tobacco: Never Used  . Tobacco comment: Decreased Down to 2/3 a Day if Thinking [place] It  Substance and Sexual Activity  . Alcohol use: No    Alcohol/week: 0.0 standard drinks    Comment: none in 10 years  . Drug use: No  . Sexual activity: Never

## 2019-04-24 ENCOUNTER — Telehealth: Payer: Self-pay

## 2019-04-24 ENCOUNTER — Telehealth: Payer: Self-pay | Admitting: Radiology

## 2019-04-24 ENCOUNTER — Ambulatory Visit (HOSPITAL_COMMUNITY)
Admission: RE | Admit: 2019-04-24 | Discharge: 2019-04-24 | Disposition: A | Discharge status: Home / Self Care | Payer: Medicare Other | Source: Ambulatory Visit | Attending: Orthopaedic Surgery | Admitting: Orthopaedic Surgery

## 2019-04-24 DIAGNOSIS — M7989 Other specified soft tissue disorders: Secondary | ICD-10-CM | POA: Insufficient documentation

## 2019-04-24 DIAGNOSIS — M79662 Pain in left lower leg: Secondary | ICD-10-CM

## 2019-04-24 DIAGNOSIS — M79661 Pain in right lower leg: Secondary | ICD-10-CM

## 2019-04-24 NOTE — Progress Notes (Signed)
Bilateral lower extremity venous duplex has been completed. Preliminary results can be found in CV Proc through chart review.  Results were given to Dr. Lorin Mercy.  04/24/19 11:13 AM Carlos Levering RVT

## 2019-04-24 NOTE — Telephone Encounter (Signed)
Patient's husband called regarding her Memantine (Namenda) 5mg . He stated that since she's been off it and it was discontinued at her last visit in November, she has gotten worse. He stated that she gets hostile and has more memory issues that have worsened. He stated he would like her to go back on it because he believes it was helping her. Please review and advise. Thank you.

## 2019-04-24 NOTE — Telephone Encounter (Signed)
I called and spoke with patient's husband and gave results. He would like to know what the plan is now? He also wanted to know if you would be able to prescribe her something like a stronger tylenol? He states that she cannot take stronger pain medications. Please advise.

## 2019-04-24 NOTE — Telephone Encounter (Signed)
Dr Lorin Mercy received call report--patient vascular studies were negative for DVT. I left voicemail on home phone advising. I called cell phone with no answer and no voicemail set up.

## 2019-04-25 ENCOUNTER — Ambulatory Visit: Payer: Self-pay

## 2019-04-25 NOTE — Telephone Encounter (Signed)
She can talk with her PCP about medication thanks

## 2019-04-26 ENCOUNTER — Telehealth: Payer: Self-pay | Admitting: Orthopaedic Surgery

## 2019-04-26 ENCOUNTER — Ambulatory Visit: Payer: Medicare Other | Attending: Internal Medicine

## 2019-04-26 ENCOUNTER — Ambulatory Visit: Payer: Self-pay

## 2019-04-26 DIAGNOSIS — Z23 Encounter for immunization: Secondary | ICD-10-CM | POA: Insufficient documentation

## 2019-04-26 NOTE — Progress Notes (Signed)
   Covid-19 Vaccination Clinic  Name:  Destiny Hughes    MRN: [id number] DOB: Jul 26, 1945  04/26/2019  Ms. [name] was observed post Covid-19 immunization for 15 minutes without incidence. She was provided with Vaccine Information Sheet and instruction to access the V-Safe system.   Ms. [name] was instructed to call 911 with any severe reactions post vaccine: Marland Kitchen Difficulty breathing  . Swelling of your face and throat  . A fast heartbeat  . A bad rash all over your body  . Dizziness and weakness    Immunizations Administered    Name Date Dose VIS Date Route   Pfizer COVID-19 Vaccine 04/26/2019 12:45 PM 0.3 mL 03/01/2019 Intramuscular   Manufacturer: [place]   Lot: [id number]   [place]: [id number]

## 2019-04-26 NOTE — Telephone Encounter (Signed)
Duplicate message in patient's chart.  I spoke with [name] and advised.

## 2019-04-26 NOTE — Telephone Encounter (Signed)
Patient's spouse called and requesting some pain medication for patient.Stated neck and knee is hurting.  Please call [name] @ [phone]

## 2019-04-26 NOTE — Telephone Encounter (Signed)
I called [name] and advised.

## 2019-04-29 ENCOUNTER — Other Ambulatory Visit (HOSPITAL_COMMUNITY): Payer: Self-pay

## 2019-04-29 ENCOUNTER — Telehealth: Payer: Self-pay | Admitting: Emergency Medicine

## 2019-04-29 MED ORDER — MEMANTINE HCL 5 MG PO TABS: ORAL_TABLET | ORAL | 5 refills | Status: DC

## 2019-04-29 NOTE — Telephone Encounter (Signed)
Called [employer] on 04/29/2019 at 3:00 PM. [employer] stated that they have received application for Stiolto Respiamt however have not processed it. [employer] reperentative states it appears they have the entire application (was missing financial documentation previously). Asked [employer] representative to process application over the phone. She states it will take ~1 day to process and she was unable to do so during current phone call. Processing application will determine if application is approved or denied.  Called patient's husband Destiny Hughes) on 04/29/2019 at 3:21 PM to provide him status update and instructed him to call [employer] (provided him with [facility] phone number) to determine if application is approved (can determine shipment date at this time as well if approved) or denied. If denied, instructed Destiny Hughes to call [facility] office to notify pharmacy team. Pharmacy team is not here on Tuesdays but will get back to him as soon as possible. Destiny Hughes verbalized understanding.  Thank you for involving pharmacy to assist in providing this patient's care.   Drexel Iha, PharmD PGY2 Ambulatory Care Pharmacy Resident

## 2019-04-29 NOTE — Telephone Encounter (Signed)
Called and spoke with pt's husband [name] to see if he mailed the patient assistance application for stiolto back after he had filled it out and he stated that he mailed it back in December to the company once he filled out what he was needing to do. [name] stated he has not heard anything in regards to the application and also states pt is running low on the medication.  Stanton Kidney, is there any way you might be able to help [place] out with this to see what might need to be done?

## 2019-04-30 NOTE — Telephone Encounter (Signed)
Thank you :)

## 2019-05-06 ENCOUNTER — Other Ambulatory Visit: Payer: Self-pay | Admitting: Family Medicine

## 2019-05-07 NOTE — Telephone Encounter (Signed)
Patient went back on Memantine 5mg  and I  Sent it in on 04/29/19 per doctor.

## 2019-05-10 ENCOUNTER — Other Ambulatory Visit: Payer: Self-pay | Admitting: Family Medicine

## 2019-05-21 ENCOUNTER — Ambulatory Visit: Payer: Medicare Other | Attending: Internal Medicine

## 2019-05-21 DIAGNOSIS — Z23 Encounter for immunization: Secondary | ICD-10-CM | POA: Insufficient documentation

## 2019-05-21 NOTE — Progress Notes (Signed)
   Covid-19 Vaccination Clinic  Name:  Destiny Hughes    MRN: [id number] DOB: [birth year]  05/21/2019  Destiny Hughes was observed post Covid-19 immunization for 15 minutes without incident. She was provided with Vaccine Information Sheet and instruction to access the V-Safe system.   Destiny Hughes was instructed to call 911 with any severe reactions post vaccine: Marland Kitchen Difficulty breathing  . Swelling of face and throat  . A fast heartbeat  . A bad rash all over body  . Dizziness and weakness   Immunizations Administered    Name Date Dose VIS Date Route   Pfizer COVID-19 Vaccine 05/21/2019 12:39 PM 0.3 mL 03/01/2019 Intramuscular   Manufacturer: [place]   Lot: [id number]   [place]: [id number]

## 2019-05-24 ENCOUNTER — Telehealth (HOSPITAL_COMMUNITY): Payer: Self-pay | Admitting: *Deleted

## 2019-05-24 NOTE — Telephone Encounter (Signed)
PA for pt Bupropion HCL ER (XL) 150mg  has been approved by [employer]. Approved thru 03/20/20.

## 2019-05-30 ENCOUNTER — Telehealth: Payer: Self-pay | Admitting: Emergency Medicine

## 2019-05-30 NOTE — Telephone Encounter (Signed)
Spoke with [name] and he is requesting an appt to see Dr. [name]. He doesn't think the Stiolto is working and request new medication and the insurance doesn't want to cover it anyway. I made her an appt on Monday 06/03/2019 at 3pm. Nothing further is needed.

## 2019-06-01 ENCOUNTER — Other Ambulatory Visit: Payer: Self-pay | Admitting: Family Medicine

## 2019-06-03 ENCOUNTER — Encounter: Payer: Self-pay | Admitting: Emergency Medicine

## 2019-06-03 ENCOUNTER — Other Ambulatory Visit: Payer: Self-pay

## 2019-06-03 ENCOUNTER — Ambulatory Visit (INDEPENDENT_AMBULATORY_CARE_PROVIDER_SITE_OTHER): Payer: Medicare Other | Admitting: Emergency Medicine

## 2019-06-03 DIAGNOSIS — J449 Chronic obstructive pulmonary disease, unspecified: Secondary | ICD-10-CM | POA: Diagnosis not present

## 2019-06-03 MED ORDER — ARFORMOTEROL TARTRATE 15 MCG/2ML IN NEBU: 15.0000 ug | INHALATION_SOLUTION | Freq: Two times a day (BID) | RESPIRATORY_TRACT | 6 refills | Status: DC

## 2019-06-03 NOTE — Patient Instructions (Signed)
Please call our office and let us know which nebulized medicine you are using at home and how often you are using it. Continue Stiolto for now.   We will try starting Brovana 1 nebulization twice a day every day on a schedule. Follow with Dr [name] in 1 month to reassess your medications

## 2019-06-03 NOTE — Progress Notes (Signed)
Subjective:    Patient ID: Destiny Hughes, female    DOB: 29-Jun-1945, 74 y.o.   MRN: [id number]  HPI Destiny Hughes with a history of depression, dementia, GERD, hyperlipidemia, hypothyroidism, chronic pain with a implanted nerve stimulator, A Fib s/p cardioversion last year.  She is been followed by Dr. Ashok Cordia in our office for very severe COPD with emphysematous change.  FEV1 0.75 L (35% predicted) 08/27/2015. Last seen here 04/2016.   [name] 01/07/2019 --Destiny Hughes with dementia, GERD, depression, hyperlipidemia and hypothyroidism, atrial fibrillation.  She has very severe COPD with emphysematous change, severe obstruction noted on pulmonary function testing last 08/27/2015.  She is been tried on multiple bronchodilators including Spiriva, Bevespi, Anoro.  She may have gotten benefit from [facility] but it was cost prohibitive.  Since last time she has been able get patient assistance for Stiolto. She does not have any albuterol or rescue meds.  She has persistent rhinorrhea, does not believe that she gets any benefit from steroid nasal spray.  She is on an antihistamine. She has throat clearing, minimal cough.  She did not desaturate on ambulation 12/05/2017.  Her functional capacity and walk distance are both limited by dyspnea.   [name] 3/Destiny/21 --[name] is 24 and has a history of very severe COPD with emphysema.  She also has dementia, GERD, depression, hyperlipidemia, atrial fibrillation, chronic pain with a nerve stimulator.  She has been tried on many bronchodilators but cost and effectiveness have been problematic.  Last time we left her on Stiolto as she had some financial assistance for this.  She and her husband are present and both contribute to the history, they report today that she is on Stiolto but is unsure whether she will get refills from [facility] cares. She uses a nebulizer, but she isn't sure what is in it.    Review of Systems As above     Objective:   Physical Exam Vitals:   03/Destiny/21 1454  BP: (!) 142/78  Pulse: 62  Temp: 97.6 F (36.4 C)  TempSrc: Temporal  SpO2: 96%  Weight: 201 lb 12.8 oz (91.5 kg)  Height: 5\' 7"  (1.702 m)   Gen: Pleasant, well-nourished, in no distress, in a wheelchair  ENT: No lesions,  mouth clear,  oropharynx clear, no postnasal drip  Neck: No JVD, no stridor  Lungs: No use of accessory muscles, decreased bilaterally, no wheezing, no crackles  Cardiovascular: RRR, heart sounds normal, no murmur or gallops, 1-2 + peripheral edema  Musculoskeletal: No deformities, no cyanosis or clubbing  Neuro: alert, forgetful, some pressured speech.  Moves all extremities, no apparent focal deficits  Skin: Warm, no lesions or rash     Assessment & Plan:  COPD (chronic obstructive pulmonary disease) (HCC) Very difficult situation where the patient has barriers to being treated that include intolerance to medications, inability to describe clinical response, inability to recall which medicine she is actually on.  Apparently she has not tolerated albuterol in the past but she is on nebulizers which she uses as needed, unclear whether this is Xopenex or other.  She tells me that she thinks it is saline.  There was assistance from [facility] that I thought was going to supplement and help them get Stiolto.  Unclear whether they follow through on this.  Also the patient is unsure she is delivering it correctly.  At this point I want her to be on at least 1 stable bronchodilator.  I am going to try starting  Brovana twice daily to see if she can take it consistently and obtain reliably.  They will call me to let me know which nebulized medicine they already possess that she has been using as needed and we will refill if this is SABA  Baltazar Apo, MD, PhD 3/Destiny/2021, 5:40 PM [facility] [phone] or if no answer [phone]

## 2019-06-03 NOTE — Assessment & Plan Note (Signed)
Very difficult situation where the patient has barriers to being treated that include intolerance to medications, inability to describe clinical response, inability to recall which medicine she is actually on.  Apparently she has not tolerated albuterol in the past but she is on nebulizers which she uses as needed, unclear whether this is Xopenex or other.  She tells me that she thinks it is saline.  There was assistance from [facility] that I thought was going to supplement and help them get Stiolto.  Unclear whether they follow through on this.  Also the patient is unsure she is delivering it correctly.  At this point I want her to be on at least 1 stable bronchodilator.  I am going to try starting Brovana twice daily to see if she can take it consistently and obtain reliably.  They will call me to let me know which nebulized medicine they already possess that she has been using as needed and we will refill if this is SABA

## 2019-06-04 ENCOUNTER — Telehealth: Payer: Self-pay | Admitting: Emergency Medicine

## 2019-06-04 ENCOUNTER — Other Ambulatory Visit: Payer: Self-pay

## 2019-06-04 MED ORDER — ARFORMOTEROL TARTRATE 15 MCG/2ML IN NEBU: 15.0000 ug | INHALATION_SOLUTION | Freq: Two times a day (BID) | RESPIRATORY_TRACT | 6 refills | Status: DC

## 2019-06-04 NOTE — Telephone Encounter (Signed)
Spoke with pharmacist and she states they are unable to get the [facility] for pt and is requesting an alternative. If we want her to stay on Brovana, we would have to send the Rx in to another pharmacy. Dr. [name] which do you prefer? Please advise.

## 2019-06-05 NOTE — Telephone Encounter (Signed)
[name] husband called-- wants to know what he needs to do- what's the next step. Please advise, Kourtlynn Nelms [phone]

## 2019-06-05 NOTE — Telephone Encounter (Signed)
Called [employer] to check on patient's Stiolto assistance application. Rep Mareesa advised all required documents were received, but it appears application was " stuck" in their system. She was able to process application fully.  Received notification from [facility] regarding an approval for Stiolto from 06/05/19 to 03/20/20. Rep will expedite patient's shipment to mail out tomorrow or Friday. Patient should received in 7-10 business days.  Phone number: [phone]  Called patient to advise, spoke to spouse, Destiny Hughes. He had no other questions or concerns.  4:04 PM Beatriz Chancellor, CPhT

## 2019-06-05 NOTE — Telephone Encounter (Signed)
Figuring out which BD to order for her has been very complicated. Based on our last visit, they may have assistance from [employer] to cover [employer] - pt and husband are not sure (our pharmacists worked on this).  I really can't pick a good regimen for her without a copy of her insurance formulary. The clinical pharmacist has tried to help with this and the financial aid issue - may be beneficial for her to see our pharmacist again Shepherd, CURRENT MEDS, ANY [employer] PAPERWORK IN HAND. Alternatively she can see me or an APP to discuss, but they need to come with this information for the visit to be useful.

## 2019-06-05 NOTE — Telephone Encounter (Signed)
Spoke with husband is he unable to obtain a formulary.   Will route to Pharmacy team to see which is best BD patient to use from a cost standpoint. If nothing further can be done we will make an appointment with an APP to discuss other options

## 2019-06-10 NOTE — Telephone Encounter (Signed)
Noted.  Will close encounter.  

## 2019-06-12 ENCOUNTER — Ambulatory Visit: Payer: Medicare Other | Attending: Internal Medicine

## 2019-06-12 ENCOUNTER — Telehealth: Payer: Self-pay | Admitting: Family Medicine

## 2019-06-12 DIAGNOSIS — Z20822 Contact with and (suspected) exposure to covid-19: Secondary | ICD-10-CM

## 2019-06-12 NOTE — Telephone Encounter (Signed)
Pt state that she takes Gabapentin also tried asper cream and not helping the pain, states that she just wants a muscle relaxer to relax the back of her neck. Please advise

## 2019-06-12 NOTE — Telephone Encounter (Signed)
Would be hesitant to start a muscle relaxer as in the past Zanaflex caused increased confusion.  Pt should try heat, stretching, massage, OTC rubs (biofreeze, Aspercreme, or icy hot).

## 2019-06-12 NOTE — Telephone Encounter (Signed)
Pt is having neck spasms and is requesting a muscle relaxer. [name] husband is in the hospital because he was diagnosed with Covid. Pt uses [facility]. Pt would like a call regardless if Dr. Volanda Napoleon calls in medication or not. Thanks

## 2019-06-13 LAB — SARS-COV-2, NAA 2 DAY TAT

## 2019-06-13 LAB — NOVEL CORONAVIRUS, NAA: SARS-CoV-2, NAA: NOT DETECTED

## 2019-06-14 NOTE — Telephone Encounter (Signed)
Pt states that she has tried most OTC creams and heating  is not working, states that she wants to try another muscle relaxer state that she has a bad headaches coming from her neck and she believes that she is having a lot of tension due to husband being in the hospital. Please advise

## 2019-06-21 ENCOUNTER — Other Ambulatory Visit: Payer: Self-pay | Admitting: Family Medicine

## 2019-06-27 ENCOUNTER — Other Ambulatory Visit: Payer: Self-pay | Admitting: Family Medicine

## 2019-06-27 DIAGNOSIS — R600 Localized edema: Secondary | ICD-10-CM

## 2019-07-03 ENCOUNTER — Ambulatory Visit (INDEPENDENT_AMBULATORY_CARE_PROVIDER_SITE_OTHER): Payer: Medicare Other | Admitting: Orthopaedic Surgery

## 2019-07-03 ENCOUNTER — Ambulatory Visit (HOSPITAL_COMMUNITY): Payer: Medicare Other | Admitting: Psychiatry

## 2019-07-03 ENCOUNTER — Telehealth: Payer: Self-pay | Admitting: Orthopaedic Surgery

## 2019-07-03 ENCOUNTER — Other Ambulatory Visit: Payer: Self-pay

## 2019-07-03 ENCOUNTER — Encounter: Payer: Self-pay | Admitting: Orthopaedic Surgery

## 2019-07-03 VITALS — Ht 66.0 in | Wt 198.0 lb

## 2019-07-03 DIAGNOSIS — Z96651 Presence of right artificial knee joint: Secondary | ICD-10-CM | POA: Diagnosis not present

## 2019-07-03 DIAGNOSIS — Z981 Arthrodesis status: Secondary | ICD-10-CM | POA: Insufficient documentation

## 2019-07-03 DIAGNOSIS — G8929 Other chronic pain: Secondary | ICD-10-CM

## 2019-07-03 DIAGNOSIS — M25561 Pain in right knee: Secondary | ICD-10-CM

## 2019-07-03 MED ORDER — CYCLOBENZAPRINE HCL 10 MG PO TABS: 10.0000 mg | ORAL_TABLET | Freq: Every day | ORAL | 0 refills | Status: DC

## 2019-07-03 NOTE — Progress Notes (Signed)
Office Visit Note   Patient: Destiny Hughes           Date of Birth: 1945/12/18           MRN: [id number] Visit Date: 07/03/2019              Requested by: Billie Ruddy, MD [place],  [place] [id number] PCP: Billie Ruddy, MD   Assessment & Plan: Visit Diagnoses:  1. Chronic pain of right knee   2. Status post total right knee replacement   3. History of fusion of cervical spine   4.     Residual right quad weakness  Plan: Patient needs some therapy to strengthen her right quad which should improve the pain in her right leg.  She never really got full quad strength and demonstrates quad weakness with exam.  Pain gets worse during the day which is consistent with residual quad weakness.  Previous x-rays show no evidence of loosening or subsidence of the prosthesis.  Her cervical fusion is solid she has some degenerative changes above the fusion.  She gets some relief with traction but she can try some Aspercreme topically.  She and her husband discussed trying some pain medication.  She has been on chronic pain management in the past including strong medication which was really not effective helping her pain.  She requests a muscle relaxant we send in some Flexeril she can take 1 at night 10 mg.PRN spasms.   Follow-Up Instructions: Return if symptoms worsen or fail to improve.   Orders:  Orders Placed This Encounter  Procedures  . Ambulatory referral to Physical Therapy   Meds ordered this encounter  Medications  . cyclobenzaprine (FLEXERIL) 10 MG tablet    Sig: Take 1 tablet (10 mg total) by mouth at bedtime.    Dispense:  30 tablet    Refill:  0      Procedures: No procedures performed   Clinical Data: No additional findings.   Subjective: Chief Complaint  Patient presents with  . Neck - Pain  . Right Knee - Pain    HPI 74 year old female returns she is here with her husband states she is having chronic neck pain posteriorly with  some headaches.  At times sometimes her hand seems like he wants to go to sleep she is also having constant continued pain in her right knee.  Right knee has had 2 procedures latest was revision by Dr. Adriana Mccallum.  X-rays look good.  She had gotten some temporary relief with the injection in the past.  She is used ice heat Tylenol Voltaren gel.  In the past she has been on chronic pain medication.  She has had spinal cord stimulator implanted and then later removed.  She has a painful left knee osteoarthritis but is hesitant to consider total knee arthroplasty due to the persistent pain in her right knee after multiple surgeries.  She states including arthroscopy she has had 5 procedures on her right knee.  Patient had neck surgery with Dr. Trenton Gammon 3 level fusion had pseudoarthrosis and posterior fusion and has solid incorporation anteriorly now confirmed by CT scan since her posterior fusion which is more than 9 years ago.  Review of Systems 14 point systems update unchanged from 04/23/2019 other than as mentioned in HPI.   Objective: Vital Signs: Ht 5\' 6"  (1.676 m)   Wt 198 lb (89.8 kg)   BMI 31.96 kg/m   Physical Exam Constitutional:  Appearance: She is well-developed.  HENT:     Head: Normocephalic.     Right Ear: External ear normal.     Left Ear: External ear normal.  Eyes:     Pupils: Pupils are equal, round, and reactive to light.  Neck:     Thyroid: No thyromegaly.     Trachea: No tracheal deviation.  Cardiovascular:     Rate and Rhythm: Normal rate.  Pulmonary:     Effort: Pulmonary effort is normal.  Abdominal:     Palpations: Abdomen is soft.  Skin:    General: Skin is warm and dry.  Neurological:     Mental Status: She is alert and oriented to person, place, and time.  Psychiatric:        Behavior: Behavior normal.     Ortho Exam posterior incisions well-healed.  She has limited rotation 50% right and left with discomfort.  She has tenderness over the right anterior  knee and has some weakness of her quad with resisted pressure.  No weakness of the left quad.  When she gets up on a single step she does a hop step to lunch up with her right leg first.  Left leg does much better.  Left knee has some crepitus.  No effusion or increased warmth right knee.  Specialty Comments:  No specialty comments available.  Imaging: No results found.   PMFS History: Patient Active Problem List   Diagnosis Date Noted  . History of fusion of cervical spine 07/03/2019  . Spinal cord stimulator dysfunction ([place]) 06/04/2018  . Chronic cough 05/21/2018  . Tremor 02/16/2018  . Chronic rhinitis 02/05/2018  . Hallucinations   . Psychosis ([place]) 11/07/2016  . Acute respiratory failure with hypoxia and hypercapnia ([place]) 11/03/2016  . Other chest pain 10/23/2016  . Tobacco abuse 10/23/2016  . Major depression with psychotic features ([place]) 10/19/2016  . Dementia with behavioral disturbance ([place]) 10/19/2016  . Depression due to physical illness   . Cognitive retention disorder   . Atrial flutter ([place]) 08/15/2016  . Paroxysmal atrial fibrillation ([place]) 08/15/2016  . SVT (supraventricular tachycardia) ([place]) 07/11/2016  . Weight loss 07/08/2016  . AKI (acute kidney injury) ([place]) 07/02/2016  . Acute kidney injury ([place]) 07/01/2016  . Hyperglycemia 07/01/2016  . Hyponatremia 07/01/2016  . Intractable nausea and vomiting 07/01/2016  . Abnormal EKG 07/01/2016  . Diastolic CHF ([place]) [id number]  . Medication management 04/28/2015  . Emphysema lung ([place]) 12/26/2014  . Gastroesophageal reflux disease without esophagitis 12/26/2014  . Chronic bronchitis ([place]) 11/13/2014  . COPD (chronic obstructive pulmonary disease) ([place]) 10/03/2014  . At high risk for falls 10/03/2014  . Other specified hypothyroidism 10/03/2014  . Mitral valve prolapse 10/03/2014  . Encounter for Medicare annual wellness exam 10/03/2014  . IBS (irritable bowel syndrome) 04/01/2014  . S/P total knee  replacement 04/22/2013  . Knee pain, chronic 04/22/2013  . Essential hypertension 04/15/2013  . Vitamin D deficiency 04/15/2013  . Encounter for long-term (current) use of medications 04/15/2013  . Prediabetes 04/15/2013  . Osteoarthritis 01/31/2011  . Hyperlipemia   . Glaucoma   . Chronic pain disorder   . Chronic back pain    Past Medical History:  Diagnosis Date  . Arthritis    OA- knees & back  neck  . Chronic back pain   . Cognitive retention disorder   . Collapse of right lung   . COPD (chronic obstructive pulmonary disease) (HCC)    Hx. Bronchitis occ, 01-25-11 somes issue now taking  Z-pak-started 01-24-11/Scar tissue  present  from previous lung collapse  . Dementia with psychosis ([place])   . Depression   . Depression   . Dyspnea   . Dysrhythmia 2018   treated with Ablation   . GERD (gastroesophageal reflux disease)    tx. TUMS  . Glaucoma   . Headache   . History of kidney stones    surgically treated  . Hyperlipemia   . Hypertension   . Hypothyroidism    tx. Levothyroxine  . Major depression with psychotic features ([place])   . Nephrolithiasis   . Neuromuscular disorder ([place]) 01-25-11   Pain/nerve stimulator implanted -2 yrs ago.  Marland Kitchen Reflex, gag, absent   . Reflex, gag, absent     Family History  Problem Relation Age of Onset  . Heart attack Father   . Emphysema Father   . Aneurysm Mother        CEREBRAL  . Emphysema Mother   . Dementia Mother   . Diabetes Son   . Hypertension Son   . Hypertension Son   . Cancer Sister        Widely Metastatic  . Asthma Son        x2  . Breast cancer Paternal Aunt   . Rheum arthritis Maternal Aunt     Past Surgical History:  Procedure Laterality Date  . A-FLUTTER ABLATION N/A 08/16/2016   Procedure: A-Flutter Ablation;  Surgeon: Evans Lance, MD;  Location: [facility];  Service: Cardiovascular;  Laterality: N/A;  . ANTERIOR CERVICAL DECOMP/DISCECTOMY FUSION     3 levels, per family  . APPENDECTOMY   01-25-11  . CARPAL TUNNEL RELEASE  01-25-11   Bil.  . CERVICAL SPINE SURGERY  01-25-11   2005-multiple levels  . CHOLECYSTECTOMY  01-25-11   '07  . COLONOSCOPY WITH PROPOFOL N/A 07/23/2012   Procedure: COLONOSCOPY WITH PROPOFOL;  Surgeon: Garlan Fair, MD;  Location: [facility];  Service: Endoscopy;  Laterality: N/A;  . detatched retna Bilateral    done July & August- 2019  . EYE SURGERY Bilateral 2019   repair of retina detachment- Dr. Zadie Rhine at surgical center   . JOINT REPLACEMENT  01-25-11   6'03 LTHA-hemi  . KNEE ARTHROPLASTY  01-25-11   '04-right, revised x2  . no gag reflex     Pt does not havea gag reflex following  siurgery to neck. Family unsure of date. Pt takes pill in apple sauce.  . SPINAL CORD STIMULATOR BATTERY EXCHANGE N/A 01/12/2018   Procedure: Spinal cord stimulator battery replacement;  Surgeon: Clydell Hakim, MD;  Location: [facility];  Service: Neurosurgery;  Laterality: N/A;  left  . SPINAL CORD STIMULATOR IMPLANT  2009 APPROX   DR. ELSNER  . SPINAL CORD STIMULATOR REMOVAL N/A 06/04/2018   Procedure: Removal of spinal cord stimulator, thoracic paddle and generator;  Surgeon: Kristeen Miss, MD;  Location: [facility];  Service: Neurosurgery;  Laterality: N/A;  . THORACIC SPINE SURGERY  01-25-11   6'02- then nerve stimulator implanted after  . TOTAL KNEE REVISION  02/01/2011   Procedure: TOTAL KNEE REVISION;  Surgeon: Mauri Pole;  Location: [facility];  Service: Orthopedics;  Laterality: Right;  . TUBAL LIGATION     Social History   Occupational History  . Occupation: disabled  Tobacco Use  . Smoking status: Former Smoker    Packs/day: 0.75    Years: 50.00    Pack years: 37.50    Types: Cigarettes    Start date: 09/13/1961  Quit date: 05/2016    Years since quitting: 3.1  . Smokeless tobacco: Never Used  . Tobacco comment: Decreased Down to 2/3 a Day if [place] It  Substance and Sexual Activity  . Alcohol use: No    Alcohol/week: 0.0 standard drinks     Comment: none in 10 years  . Drug use: No  . Sexual activity: Never

## 2019-07-03 NOTE — Telephone Encounter (Signed)
Choose one or the other. Thanks

## 2019-07-03 NOTE — Telephone Encounter (Signed)
I called and spoke with patient and husband. They would like to know if you could prescribe her a light muscle relaxer to take during the day so that she is able to get around. She will not take the flexeril tonight but will take her sleeping medication and will wait for return call from [place] as far as muscle relaxer.

## 2019-07-03 NOTE — Telephone Encounter (Signed)
Patient's husband [name] called wanting Dr Lorin Mercy to know patient is taking a sleeping pill at night. [name] asked if patient should also take the Rx Cyclobenzaprine? The number to contact [name] is [phone]

## 2019-07-03 NOTE — Telephone Encounter (Signed)
Please advise 

## 2019-07-04 NOTE — Telephone Encounter (Signed)
Please see below and advise.

## 2019-07-04 NOTE — Telephone Encounter (Signed)
I called and discussed. Can split tablet even though it is not scored and take one half Q8 hrs . Prn .

## 2019-07-04 NOTE — Telephone Encounter (Signed)
Patient's Husband called and would like to speak to Dr. Lorin Mercy in regards to the muscle relaxer.  [phone].  Thank you.

## 2019-07-06 ENCOUNTER — Other Ambulatory Visit: Payer: Self-pay | Admitting: Family Medicine

## 2019-07-08 ENCOUNTER — Encounter: Payer: Self-pay | Admitting: Emergency Medicine

## 2019-07-08 ENCOUNTER — Other Ambulatory Visit: Payer: Self-pay

## 2019-07-08 ENCOUNTER — Ambulatory Visit (INDEPENDENT_AMBULATORY_CARE_PROVIDER_SITE_OTHER): Payer: Medicare Other | Admitting: Emergency Medicine

## 2019-07-08 DIAGNOSIS — J449 Chronic obstructive pulmonary disease, unspecified: Secondary | ICD-10-CM | POA: Diagnosis not present

## 2019-07-08 NOTE — Progress Notes (Signed)
   Subjective:    Patient ID: Destiny Hughes, female    DOB: 08/05/1945, 74 y.o.   MRN: [id number]  HPI  [name] 06/03/19 --Destiny Hughes is 66 and has a history of very severe COPD with emphysema.  Destiny Hughes also has dementia, GERD, depression, hyperlipidemia, atrial fibrillation, chronic pain with a nerve stimulator.  Destiny Hughes has been tried on many bronchodilators but cost and effectiveness have been problematic.  Last time we left Destiny Hughes on Stiolto as Destiny Hughes had some financial assistance for this.  Destiny Hughes and Destiny Hughes husband are present and both contribute to the history, they report today that Destiny Hughes is on Stiolto but is unsure whether Destiny Hughes will get refills from [facility] cares. Destiny Hughes uses a nebulizer, but Destiny Hughes isn't sure what is in it.   [name] 07/08/19 --74 year old woman with severe COPD and emphysema, dementia, GERD, depression, atrial fibrillation, chronic pain with a nerve stimulator.  We have had a hard time getting Destiny Hughes on a stable bronchodilator regimen both due to intolerance of medications, inability to describe clinical response, poor understanding and ability to report back to regimen.  It looks like since last visit we have been able to confirm that Destiny Hughes has [employer] coverage for [employer].  Last time because it was unclear exactly what Destiny Hughes is taking I tried starting Destiny Hughes on Brovana twice a day. Destiny Hughes is unsure which other nebs Destiny Hughes is on. Destiny Hughes doesn't know whether Destiny Hughes ever got the [place] or not. Destiny Hughes is using some neb 2x a day. COVID shots up to date.     Review of Systems As above     Objective:   Physical Exam Vitals:   07/08/19 1331  BP: 126/60  Pulse: 82  Temp: 98 F (36.7 C)  SpO2: 97%  Weight: 200 lb (90.7 kg)  Height: 5\' 6"  (1.676 m)   Gen: Pleasant, well-nourished, in no distress  ENT: No lesions,  mouth clear,  oropharynx clear, no postnasal drip  Neck: No JVD, no stridor  Lungs: No use of accessory muscles, decreased bilaterally, no wheezing, no crackles  Cardiovascular: RRR, heart sounds normal, no  murmur or gallops, no peripheral edema  Musculoskeletal: No deformities, no cyanosis or clubbing  Neuro: alert, forgetful, some pressured speech.  Moves all extremities, no apparent focal deficits  Skin: Warm, no lesions or rash     Assessment & Plan:  COPD (chronic obstructive pulmonary disease) (HCC) I was able to confirm that they have Stiolto and Destiny Hughes has been using 2 puffs once daily.  They also have multiple nebulized medications but do not know the names and do not know how frequently they take them.  We need a medication counter in order to clarify.  I want Destiny Hughes to stay on the [facility] which Destiny Hughes is now going to be able to get through [facility] through the rest to 2021.  I want Destiny Hughes on albuterol or Xopenex as needed.  If Destiny Hughes was using Atrovent as Destiny Hughes rescue medication we can probably discontinue this.  Also I do not see any reason for Destiny Hughes to stay on the Brovana that I started last time since it is redundant with the [place].  Baltazar Apo, MD, PhD 07/08/2019, 1:46 PM [facility] [phone] or if no answer [phone]

## 2019-07-08 NOTE — Patient Instructions (Signed)
Please continue Stiolto 2 puffs once daily.  You will be getting this through [facility] for the rest of the year 2021. We will arrange for a visit in the near future to review all of your medications to clarify which you have and what you are actually taking.  Based on that visit we can then make a recommendations about what to continue going forward. COVID vaccine up to date Follow with Dr [name] in 6 months or sooner if you have any problems

## 2019-07-08 NOTE — Assessment & Plan Note (Signed)
I was able to confirm that they have Stiolto and she has been using 2 puffs once daily.  They also have multiple nebulized medications but do not know the names and do not know how frequently they take them.  We need a medication counter in order to clarify.  I want her to stay on the [facility] which she is now going to be able to get through [facility] through the rest to 2021.  I want her on albuterol or Xopenex as needed.  If she was using Atrovent as her rescue medication we can probably discontinue this.  Also I do not see any reason for her to stay on the Brovana that I started last time since it is redundant with the [place].

## 2019-07-10 ENCOUNTER — Ambulatory Visit (INDEPENDENT_AMBULATORY_CARE_PROVIDER_SITE_OTHER): Payer: Medicare Other | Admitting: Rehabilitative and Restorative Service Providers"

## 2019-07-10 ENCOUNTER — Other Ambulatory Visit: Payer: Self-pay

## 2019-07-10 ENCOUNTER — Encounter: Payer: Self-pay | Admitting: Rehabilitative and Restorative Service Providers"

## 2019-07-10 DIAGNOSIS — M6281 Muscle weakness (generalized): Secondary | ICD-10-CM | POA: Diagnosis not present

## 2019-07-10 DIAGNOSIS — M25561 Pain in right knee: Secondary | ICD-10-CM | POA: Diagnosis not present

## 2019-07-10 DIAGNOSIS — R262 Difficulty in walking, not elsewhere classified: Secondary | ICD-10-CM | POA: Diagnosis not present

## 2019-07-10 DIAGNOSIS — G8929 Other chronic pain: Secondary | ICD-10-CM | POA: Diagnosis not present

## 2019-07-10 NOTE — Patient Instructions (Signed)
Destiny Hughes was assigned a home exercise program focused on R quadriceps strength and knee stability.  Quadriceps sets and sit to stand with slow eccentrics.  Also added a shoulder blade pinch to address postural concerns with gait (and neck pain).

## 2019-07-10 NOTE — Therapy (Signed)
[facility] [address] [place], [place], [address] Phone: [phone]   Fax:  [phone]  Physical Therapy Evaluation  Patient Details  Name: Destiny Hughes MRN: [id number] Date of Birth: 1945-06-02 Referring Provider (PT): Rodell Perna   Encounter Date: 07/10/2019  PT End of Session - 07/10/19 1228    Visit Number  1    Number of Visits  18    Date for PT Re-Evaluation  08/21/19    Progress Note Due on Visit  10    PT Start Time  1100    PT Stop Time  1155    PT Time Calculation (min)  55 min    Activity Tolerance  Patient limited by fatigue    Behavior During Therapy  [facility] for tasks assessed/performed       Past Medical History:  Diagnosis Date  . Arthritis    OA- knees & back  neck  . Chronic back pain   . Cognitive retention disorder   . Collapse of right lung   . COPD (chronic obstructive pulmonary disease) (HCC)    Hx. Bronchitis occ, 01-25-11 somes issue now taking  Z-pak-started 01-24-11/Scar tissue  present  from previous lung collapse  . Dementia with psychosis ([place])   . Depression   . Depression   . Dyspnea   . Dysrhythmia 2018   treated with Ablation   . GERD (gastroesophageal reflux disease)    tx. TUMS  . Glaucoma   . Headache   . History of kidney stones    surgically treated  . Hyperlipemia   . Hypertension   . Hypothyroidism    tx. Levothyroxine  . Major depression with psychotic features ([place])   . Nephrolithiasis   . Neuromuscular disorder ([place]) 01-25-11   Pain/nerve stimulator implanted -2 yrs ago.  Marland Kitchen Reflex, gag, absent   . Reflex, gag, absent     Past Surgical History:  Procedure Laterality Date  . A-FLUTTER ABLATION N/A 08/16/2016   Procedure: A-Flutter Ablation;  Surgeon: Evans Lance, MD;  Location: [facility];  Service: Cardiovascular;  Laterality: N/A;  . ANTERIOR CERVICAL DECOMP/DISCECTOMY FUSION     3 levels, per family  . APPENDECTOMY  01-25-11  . CARPAL TUNNEL RELEASE   01-25-11   Bil.  . CERVICAL SPINE SURGERY  01-25-11   2005-multiple levels  . CHOLECYSTECTOMY  01-25-11   '07  . COLONOSCOPY WITH PROPOFOL N/A 07/23/2012   Procedure: COLONOSCOPY WITH PROPOFOL;  Surgeon: Garlan Fair, MD;  [facility];  Service: Endoscopy;  Laterality: N/A;  . detatched retna Bilateral    done July & August- 2019  . EYE SURGERY Bilateral 2019   repair of retina detachment- Dr. Zadie Rhine at surgical center   . JOINT REPLACEMENT  01-25-11   6'03 LTHA-hemi  . KNEE ARTHROPLASTY  01-25-11   '04-right, revised x2  . no gag reflex     Pt does not havea gag reflex following  siurgery to neck. Family unsure of date. Pt takes pill in apple sauce.  . SPINAL CORD STIMULATOR BATTERY EXCHANGE N/A 01/12/2018   Procedure: Spinal cord stimulator battery replacement;  Surgeon: Clydell Hakim, MD;  Location: [place];  Service: Neurosurgery;  Laterality: N/A;  left  . SPINAL CORD STIMULATOR IMPLANT  2009 APPROX   DR. ELSNER  . SPINAL CORD STIMULATOR REMOVAL N/A 06/04/2018   Procedure: Removal of spinal cord stimulator, thoracic paddle and generator;  Surgeon: Kristeen Miss, MD;  Location: [facility];  Service: Neurosurgery;  Laterality: N/A;  . THORACIC SPINE SURGERY  01-25-11   6'02- then nerve stimulator implanted after  . TOTAL KNEE REVISION  02/01/2011   Procedure: TOTAL KNEE REVISION;  Surgeon: Mauri Pole;  [facility];  Service: Orthopedics;  Laterality: Right;  . TUBAL LIGATION      There were no vitals filed for this visit.   Subjective Assessment - 07/10/19 1210    Patient is accompained by:  Family member    Pertinent History  TKA 2012 with 2 revisions; L THA hemi; cervical spine surgery (ACDF); back and knee OA; some dementia; COPD; dyspnea; HTN; atrial flutter ablation; thoracic spine surgery; spinal stimulatorYates no follow-up.  0/115 bilaterally    Limitations  Standing;Walking;House hold activities;Other (comment)    How long can you sit comfortably?  Hour     How long can you stand comfortably?  5-10 minutes at best    How long can you walk comfortably?  Household distances    Patient Stated Goals  Walk better without pain.    Currently in Pain?  Yes    Pain Score  7     Pain Location  Knee    Pain Orientation  Right    Pain Descriptors / Indicators  Aching;Constant;Heaviness;Sore;Tightness    Pain Type  Chronic pain    Pain Onset  More than a month ago    Pain Frequency  Constant    Aggravating Factors   WB activity    Pain Relieving Factors  Some relief with rest    Effect of Pain on Daily Activities  Limited to the house.  Doesn't do much around the house and only leaves for MD appointments and things she has to do.    Multiple Pain Sites  No         [facility] PT Assessment - 07/10/19 0001      Assessment   Medical Diagnosis  Difficulty walking; R knee pain; R knee weakness    Referring Provider (PT)  Rodell Perna    Onset Date/Surgical Date  --   Chronic pain post 5 surgeries     Balance Screen   Has the patient fallen in the past 6 months  No    Has the patient had a decrease in activity level because of a fear of falling?   Yes    Is the patient reluctant to leave their home because of a fear of falling?   Yes      Center  Private residence    Living Arrangements  Spouse/significant other    Available Help at Discharge  Family    Type of [place] to enter    Entrance Stairs-Number of Steps  2    Entrance Stairs-Rails  None    Additional Comments  2 steps      Prior Function   Level of Independence  Requires assistive device for independence      ROM / Strength   AROM / PROM / Strength  AROM;Strength      AROM   Overall AROM   Deficits    Overall AROM Comments  AROM Knee (L/R in degrees): Extension 0/0; Flexion 115/115.    AROM Assessment Site  Knee      Ambulation/Gait   Ambulation/Gait  Yes    Ambulation/Gait Assistance  5: Supervision    Ambulation/Gait  Assistance Details  Kasandra Knudsen  Objective measurements completed on examination: See above findings.      [place] Adult PT Treatment/Exercise - 07/10/19 0001      Posture/Postural Control   Posture/Postural Control  Postural limitations    Postural Limitations  Forward head;Rounded Shoulders;Decreased lumbar lordosis;Increased thoracic kyphosis    Posture Comments  Lizbeth has a significant spine history with previous surgery and postural awareness will be a consideration with all activities.      Therapeutic Activites    Therapeutic Activities  Other Therapeutic Activities   Sit to stand no hands 3 sets of 5 emphasis on slow eccentric     Exercises   Exercises  Knee/Hip      Knee/Hip Exercises: Machines for Strengthening   Total Gym Leg Press  25# 2 sets of 5 (slow eccentrics)/Try more weight/reps next visit      Knee/Hip Exercises: Supine   Quad Sets  3 sets;5 reps    Quad Sets Limitations  5 second hold             PT Education - 07/10/19 1221    Education Details  HEP    Person(s) Educated  Patient;Spouse    Methods  Explanation;Demonstration;Tactile cues;Verbal cues;Handout    Comprehension  Verbalized understanding;Returned demonstration;Verbal cues required;Tactile cues required;Need further instruction       PT Short Term Goals - 07/10/19 1238      PT SHORT TERM GOAL #1   Title  Christlyn will demonstrate competence with her beginning (2 exercise) HEP.    Time  2    Period  Weeks    Status  New    Target Date  07/24/19        PT Long Term Goals - 07/10/19 1239      PT LONG TERM GOAL #1   Title  Idabell will report R knee pain consistently 0-4/10 on the Numeric Pain Rating Scale (7/10 at evaluation).    Baseline  7/10    Time  6    Period  Weeks    Status  New    Target Date  08/21/19      PT LONG TERM GOAL #2   Title  Jelesa will be able to stand and walk for at least 10 minutes, allowing her to participate in activities she  currently avoids.    Baseline  Avoids leaving the house unless required.    Time  6    Period  Weeks    Status  New    Target Date  08/21/19      PT LONG TERM GOAL #3   Title  Improve R quadriceps strength to at least 4+/5.    Baseline  3/5    Time  6    Period  Weeks    Status  New    Target Date  08/21/19      PT LONG TERM GOAL #4   Title  Maekayla will be independent with her long-term strength and general conditioning HEP at DC.    Time  6    Period  Weeks    Status  New    Target Date  08/21/19             Plan - 07/10/19 1231    Clinical Impression Statement  Zorianna has a significant medical history including TKA 2012 with revision since the initial surgery.  Her R quadriceps is very weak making her knee unstable and painful.  With skilled PT to address quadriceps strength, knee stability and (probably)  balance, Alyssabeth's prognosis to be safe and independent with ADLs she is currently apprehensive to do is good.    Personal Factors and Comorbidities  Fitness;Comorbidity 3+    Comorbidities  TKA 2012 with 2 revisions; L THA hemi; cervical spine surgery (ACDF); back and knee OA; some dementia; COPD; dyspnea; HTN; atrial flutter ablation; thoracic spine surgery; spinal stimulatorYates no follow-up.  0/115 bilaterally    Examination-Activity Limitations  Locomotion Level;Transfers;Bed Mobility;Squat;Dressing;Stairs;Stand    Examination-Participation Restrictions  Meal Prep;Cleaning;Community Activity;Driving;Shop;Laundry    Stability/Clinical Decision Making  Evolving/Moderate complexity    Clinical Decision Making  Moderate    Rehab Potential  Good    PT Frequency  Other (comment)    PT Duration  6 weeks    PT Treatment/Interventions  ADLs/Self Care Home Management;Aquatic Therapy;Cryotherapy;Scientist, product/process development;Neuromuscular re-education;Balance training;Therapeutic exercise;Therapeutic activities;Functional mobility training;Stair training;Gait  training;Patient/family education;Manual techniques    PT Next Visit Plan  Focus on general LE strengthening with quadriceps emphasis.  Go slow as Algia is generally deconditioned.    PT Home Exercise Plan  Quadriceps sets and sit to stand.    Consulted and Agree with Plan of Care  Family member/caregiver;Patient    Family Member Consulted  Husband       Patient will benefit from skilled therapeutic intervention in order to improve the following deficits and impairments:  Abnormal gait, Difficulty walking, Obesity, Decreased endurance, Cardiopulmonary status limiting activity, Decreased activity tolerance, Impaired perceived functional ability, Pain, Decreased mobility, Decreased strength, Increased edema, Postural dysfunction  Visit Diagnosis: Difficulty in walking, not elsewhere classified - Plan: PT plan of care cert/re-cert  Chronic pain of right knee - Plan: PT plan of care cert/re-cert  Muscle weakness (generalized) - Plan: PT plan of care cert/re-cert     Problem List Patient Active Problem List   Diagnosis Date Noted  . History of fusion of cervical spine 07/03/2019  . Spinal cord stimulator dysfunction ([place]) 06/04/2018  . Chronic cough 05/21/2018  . Tremor 02/16/2018  . Chronic rhinitis 02/05/2018  . Hallucinations   . Psychosis ([place]) 11/07/2016  . Acute respiratory failure with hypoxia and hypercapnia ([place]) 11/03/2016  . Other chest pain 10/23/2016  . Tobacco abuse 10/23/2016  . Major depression with psychotic features ([place]) 10/19/2016  . Dementia with behavioral disturbance ([place]) 10/19/2016  . Depression due to physical illness   . Cognitive retention disorder   . Atrial flutter ([place]) 08/15/2016  . Paroxysmal atrial fibrillation ([place]) 08/15/2016  . SVT (supraventricular tachycardia) ([place]) 07/11/2016  . Weight loss 07/08/2016  . AKI (acute kidney injury) ([place]) 07/02/2016  . Acute kidney injury ([place]) 07/01/2016  . Hyperglycemia 07/01/2016  . Hyponatremia  07/01/2016  . Intractable nausea and vomiting 07/01/2016  . Abnormal EKG 07/01/2016  . Diastolic CHF ([name]) [id number]  . Medication management 04/28/2015  . Emphysema lung ([place]) 12/26/2014  . Gastroesophageal reflux disease without esophagitis 12/26/2014  . Chronic bronchitis ([place]) 11/13/2014  . COPD (chronic obstructive pulmonary disease) ([place]) 10/03/2014  . At high risk for falls 10/03/2014  . Other specified hypothyroidism 10/03/2014  . Mitral valve prolapse 10/03/2014  . Encounter for Medicare annual wellness exam 10/03/2014  . IBS (irritable bowel syndrome) 04/01/2014  . S/P total knee replacement 04/22/2013  . Knee pain, chronic 04/22/2013  . Essential hypertension 04/15/2013  . Vitamin D deficiency 04/15/2013  . Encounter for long-term (current) use of medications 04/15/2013  . Prediabetes 04/15/2013  . Osteoarthritis 01/31/2011  . Hyperlipemia   . Glaucoma   . Chronic pain disorder   .  Chronic back pain     Farley Ly MPT 07/10/2019, 12:54 PM  [facility] Physical Therapy [address] [place], [place], [address] Phone: [phone]   Fax:  [phone]  Name: Destiny Hughes MRN: [id number] Date of Birth: 1945-05-17

## 2019-07-12 ENCOUNTER — Encounter: Payer: Self-pay | Admitting: Physical Therapy

## 2019-07-12 ENCOUNTER — Ambulatory Visit (INDEPENDENT_AMBULATORY_CARE_PROVIDER_SITE_OTHER): Payer: Medicare Other | Admitting: Physical Therapy

## 2019-07-12 ENCOUNTER — Other Ambulatory Visit: Payer: Self-pay

## 2019-07-12 DIAGNOSIS — M6281 Muscle weakness (generalized): Secondary | ICD-10-CM

## 2019-07-12 DIAGNOSIS — R262 Difficulty in walking, not elsewhere classified: Secondary | ICD-10-CM

## 2019-07-12 DIAGNOSIS — G8929 Other chronic pain: Secondary | ICD-10-CM | POA: Diagnosis not present

## 2019-07-12 DIAGNOSIS — M25561 Pain in right knee: Secondary | ICD-10-CM

## 2019-07-12 NOTE — Therapy (Signed)
[facility] Physical Therapy [address] [place], [place], [address] Phone: [phone]   Fax:  [phone]  Physical Therapy Treatment  Patient Details  Name: Destiny Hughes MRN: [id number] Date of Birth: May 22, 1945 Referring Provider (PT): Rodell Perna   Encounter Date: 07/12/2019  PT End of Session - 07/12/19 1356    Visit Number  2    Number of Visits  18    Date for PT Re-Evaluation  08/21/19    Progress Note Due on Visit  10    PT Start Time  1300    PT Stop Time  1340    PT Time Calculation (min)  40 min    Activity Tolerance  Patient limited by fatigue;Patient tolerated treatment well    Behavior During Therapy  [facility] for tasks assessed/performed       Past Medical History:  Diagnosis Date  . Arthritis    OA- knees & back  neck  . Chronic back pain   . Cognitive retention disorder   . Collapse of right lung   . COPD (chronic obstructive pulmonary disease) (HCC)    Hx. Bronchitis occ, 01-25-11 somes issue now taking  Z-pak-started 01-24-11/Scar tissue  present  from previous lung collapse  . Dementia with psychosis ([place])   . Depression   . Depression   . Dyspnea   . Dysrhythmia 2018   treated with Ablation   . GERD (gastroesophageal reflux disease)    tx. TUMS  . Glaucoma   . Headache   . History of kidney stones    surgically treated  . Hyperlipemia   . Hypertension   . Hypothyroidism    tx. Levothyroxine  . Major depression with psychotic features ([place])   . Nephrolithiasis   . Neuromuscular disorder ([place]) 01-25-11   Pain/nerve stimulator implanted -2 yrs ago.  Marland Kitchen Reflex, gag, absent   . Reflex, gag, absent     Past Surgical History:  Procedure Laterality Date  . A-FLUTTER ABLATION N/A 08/16/2016   Procedure: A-Flutter Ablation;  Surgeon: Evans Lance, MD;  Location: [facility];  Service: Cardiovascular;  Laterality: N/A;  . ANTERIOR CERVICAL DECOMP/DISCECTOMY FUSION     3 levels, per family  . APPENDECTOMY  01-25-11   . CARPAL TUNNEL RELEASE  01-25-11   Bil.  . CERVICAL SPINE SURGERY  01-25-11   2005-multiple levels  . CHOLECYSTECTOMY  01-25-11   '07  . COLONOSCOPY WITH PROPOFOL N/A 07/23/2012   Procedure: COLONOSCOPY WITH PROPOFOL;  Surgeon: Garlan Fair, MD;  [facility];  Service: Endoscopy;  Laterality: N/A;  . detatched retna Bilateral    done July & August- 2019  . EYE SURGERY Bilateral 2019   repair of retina detachment- Dr. Zadie Rhine at surgical center   . JOINT REPLACEMENT  01-25-11   6'03 LTHA-hemi  . KNEE ARTHROPLASTY  01-25-11   '04-right, revised x2  . no gag reflex     Pt does not havea gag reflex following  siurgery to neck. Family unsure of date. Pt takes pill in apple sauce.  . SPINAL CORD STIMULATOR BATTERY EXCHANGE N/A 01/12/2018   Procedure: Spinal cord stimulator battery replacement;  Surgeon: Clydell Hakim, MD;  Location: [facility];  Service: Neurosurgery;  Laterality: N/A;  left  . SPINAL CORD STIMULATOR IMPLANT  2009 APPROX   DR. ELSNER  . SPINAL CORD STIMULATOR REMOVAL N/A 06/04/2018   Procedure: Removal of spinal cord stimulator, thoracic paddle and generator;  Surgeon: Kristeen Miss, MD;  Location: [facility];  Service: Neurosurgery;  Laterality: N/A;  . THORACIC SPINE SURGERY  01-25-11   6'02- then nerve stimulator implanted after  . TOTAL KNEE REVISION  02/01/2011   Procedure: TOTAL KNEE REVISION;  Surgeon: Mauri Pole;  [facility];  Service: Orthopedics;  Laterality: Right;  . TUBAL LIGATION      There were no vitals filed for this visit.  Subjective Assessment - 07/12/19 1402    Subjective  Patient reports she is doing well, she is doing her exercises. Right knee feels alright.    Patient is accompained by:  Family member   Husband   Patient Stated Goals  Walk better without pain.    Currently in Pain?  Yes    Pain Score  5     Pain Location  Knee    Pain Orientation  Right    Pain Descriptors / Indicators  Aching;Sore;Tightness    Pain Type  Chronic  pain    Pain Onset  More than a month ago    Pain Frequency  Constant                       [facility] Adult PT Treatment/Exercise - 07/12/19 0001      Exercises   Exercises  Knee/Hip      Knee/Hip Exercises: Aerobic   Nustep  L4 x 6 min with UE/LE      Knee/Hip Exercises: Seated   Other Seated Knee/Hip Exercises  Heel raises 2x15    Other Seated Knee/Hip Exercises  Shoulder blade squeezes to improve posture x10 with 5 sec hold    Sit to Sand  10 reps   2 sets from elevated mat table     Knee/Hip Exercises: Supine   Quad Sets  5 reps   5 sec hold   Quad Sets Limitations  towel under knee    Heel Slides  2 sets;5 reps    Straight Leg Raises  2 sets;5 sets    Other Supine Knee/Hip Exercises  Clamshell with red band 2x10             PT Education - 07/12/19 1355    Education Details  HEP    Person(s) Educated  Patient    Methods  Explanation;Demonstration;Verbal cues    Comprehension  Verbalized understanding;Returned demonstration;Verbal cues required;Need further instruction       PT Short Term Goals - 07/10/19 1238      PT SHORT TERM GOAL #1   Title  Destiny Hughes will demonstrate competence with her beginning (2 exercise) HEP.    Time  2    Period  Weeks    Status  New    Target Date  07/24/19        PT Long Term Goals - 07/10/19 1239      PT LONG TERM GOAL #1   Title  Destiny Hughes will report R knee pain consistently 0-4/10 on the Numeric Pain Rating Scale (7/10 at evaluation).    Baseline  7/10    Time  6    Period  Weeks    Status  New    Target Date  08/21/19      PT LONG TERM GOAL #2   Title  Destiny Hughes will be able to stand and walk for at least 10 minutes, allowing her to participate in activities she currently avoids.    Baseline  Avoids leaving the house unless required.    Time  6    Period  Weeks  Status  New    Target Date  08/21/19      PT LONG TERM GOAL #3   Title  Improve R quadriceps strength to at least 4+/5.    Baseline  3/5     Time  6    Period  Weeks    Status  New    Target Date  08/21/19      PT LONG TERM GOAL #4   Title  Destiny Hughes will be independent with her long-term strength and general conditioning HEP at DC.    Time  6    Period  Weeks    Status  New    Target Date  08/21/19            Plan - 07/12/19 1356    Clinical Impression Statement  Patient tolerated therapy well with no adverse effects. Her strengthening exercises were progress this visit with good tolerance. She does continue to report right knee pain with all exercise but this resolves when she finishes the exercise and rests. Her endurance/shortness of breath due to COPD does limit therapy with frequent and extended rest breaks. She would benefit from continued skilled PT to progress strengthening and walking ability to maximize functional level and improve independence with ADLs.    PT Treatment/Interventions  ADLs/Self Care Home Management;Aquatic Therapy;Cryotherapy;Scientist, product/process development;Neuromuscular re-education;Balance training;Therapeutic exercise;Therapeutic activities;Functional mobility training;Stair training;Gait training;Patient/family education;Manual techniques    PT Next Visit Plan  Focus on general LE strengthening with quadriceps emphasis.  Go slow as Destiny Hughes is generally deconditioned.    PT Home Exercise Plan  [id number]: quad sets, SLR, supine heel slides, supine clamshell with red, sit<>stand, seated heel raises, shoulder blade squeezes    Consulted and Agree with Plan of Care  Family member/caregiver;Patient    Family Member Consulted  Husband       Patient will benefit from skilled therapeutic intervention in order to improve the following deficits and impairments:  Abnormal gait, Difficulty walking, Obesity, Decreased endurance, Cardiopulmonary status limiting activity, Decreased activity tolerance, Impaired perceived functional ability, Pain, Decreased mobility, Decreased strength, Increased edema,  Postural dysfunction  Visit Diagnosis: Difficulty in walking, not elsewhere classified  Chronic pain of right knee  Muscle weakness (generalized)     Problem List Patient Active Problem List   Diagnosis Date Noted  . History of fusion of cervical spine 07/03/2019  . Spinal cord stimulator dysfunction ([place]) 06/04/2018  . Chronic cough 05/21/2018  . Tremor 02/16/2018  . Chronic rhinitis 02/05/2018  . Hallucinations   . Psychosis ([place]) 11/07/2016  . Acute respiratory failure with hypoxia and hypercapnia ([place]) 11/03/2016  . Other chest pain 10/23/2016  . Tobacco abuse 10/23/2016  . Major depression with psychotic features ([place]) 10/19/2016  . Dementia with behavioral disturbance ([place]) 10/19/2016  . Depression due to physical illness   . Cognitive retention disorder   . Atrial flutter ([place]) 08/15/2016  . Paroxysmal atrial fibrillation ([place]) 08/15/2016  . SVT (supraventricular tachycardia) ([place]) 07/11/2016  . Weight loss 07/08/2016  . AKI (acute kidney injury) ([place]) 07/02/2016  . Acute kidney injury ([place]) 07/01/2016  . Hyperglycemia 07/01/2016  . Hyponatremia 07/01/2016  . Intractable nausea and vomiting 07/01/2016  . Abnormal EKG 07/01/2016  . Diastolic CHF ([place]) [id number]  . Medication management 04/28/2015  . Emphysema lung ([place]) 12/26/2014  . Gastroesophageal reflux disease without esophagitis 12/26/2014  . Chronic bronchitis ([place]) 11/13/2014  . COPD (chronic obstructive pulmonary disease) ([place]) 10/03/2014  . At high risk for falls 10/03/2014  . Other specified  hypothyroidism 10/03/2014  . Mitral valve prolapse 10/03/2014  . Encounter for Medicare annual wellness exam 10/03/2014  . IBS (irritable bowel syndrome) 04/01/2014  . S/P total knee replacement 04/22/2013  . Knee pain, chronic 04/22/2013  . Essential hypertension 04/15/2013  . Vitamin D deficiency 04/15/2013  . Encounter for long-term (current) use of medications 04/15/2013  . Prediabetes 04/15/2013  .  Osteoarthritis 01/31/2011  . Hyperlipemia   . Glaucoma   . Chronic pain disorder   . Chronic back pain     Hilda Blades, PT, DPT, LAT, ATC 07/12/19  2:44 PM Phone: [phone] Fax: [facility] [address] [place], [place], [address] Phone: [phone]   Fax:  [phone]  Name: NESSIAH DOMIANO MRN: [id number] Date of Birth: 1945/06/18

## 2019-07-12 NOTE — Patient Instructions (Signed)
Access Code: [email or web address] URL: [email or web address] Date: 07/12/2019 Prepared by: Hilda Blades  Exercises Supine Quad Set - 2 x daily - 7 x weekly - 2 sets - 5 reps - 5 seconds hold Supine Active Straight Leg Raise - 2 x daily - 7 x weekly - 2 sets - 5-10 reps Supine Heel Slide - 2 x daily - 7 x weekly - 2 sets - 5-10 reps Hooklying Clamshell with Resistance - 2 x daily - 7 x weekly - 2 sets - 10 reps Seated Heel Raise - 2 x daily - 7 x weekly - 2 sets - 15 reps Sit to Stand - 2 x daily - 7 x weekly - 2 sets - 10 reps Seated Scapular Retraction - 2 x daily - 7 x weekly - 10 reps - 5 seconds hold

## 2019-07-15 ENCOUNTER — Ambulatory Visit (INDEPENDENT_AMBULATORY_CARE_PROVIDER_SITE_OTHER): Payer: Medicare Other | Admitting: Adult Health

## 2019-07-15 ENCOUNTER — Ambulatory Visit (INDEPENDENT_AMBULATORY_CARE_PROVIDER_SITE_OTHER): Payer: Medicare Other

## 2019-07-15 ENCOUNTER — Encounter: Payer: Self-pay | Admitting: Adult Health

## 2019-07-15 ENCOUNTER — Other Ambulatory Visit: Payer: Self-pay

## 2019-07-15 VITALS — BP 122/86 | HR 60 | Temp 97.7°F | Ht 66.0 in | Wt 198.0 lb

## 2019-07-15 DIAGNOSIS — J449 Chronic obstructive pulmonary disease, unspecified: Secondary | ICD-10-CM | POA: Diagnosis not present

## 2019-07-15 DIAGNOSIS — J441 Chronic obstructive pulmonary disease with (acute) exacerbation: Secondary | ICD-10-CM

## 2019-07-15 DIAGNOSIS — R05 Cough: Secondary | ICD-10-CM | POA: Diagnosis not present

## 2019-07-15 DIAGNOSIS — R053 Chronic cough: Secondary | ICD-10-CM

## 2019-07-15 MED ORDER — ALBUTEROL SULFATE HFA 108 (90 BASE) MCG/ACT IN AERS: 2.0000 | INHALATION_SPRAY | Freq: Four times a day (QID) | RESPIRATORY_TRACT | 5 refills | Status: DC | PRN

## 2019-07-15 NOTE — Assessment & Plan Note (Signed)
Patient has underlying severe COPD.  And has a recurrent cough.  Would recommend an alternative to lisinopril and propanolol as they may be aggravating her cough shortness of breath and wheezing.  Plan  Patient Instructions  Discuss with Primary MD /Cardiology that Propranolol and Lisinopril may be aggravating your cough /shortness of breath .  Continue on Stiolto 2 puffs daily .  Mucinex DM Twice daily  As needed  Cough/congestion .  Albuterol Inhaler /Neb As needed  Wheezing .  Work on healthy weight loss.  Activity as tolerated.  Follow medication calendar closely and bring to each visit  Follow up with Dr. [name] 3 months and As needed   Please contact office for sooner follow up if symptoms do not improve or worsen or seek emergency care

## 2019-07-15 NOTE — Assessment & Plan Note (Signed)
Currently stable on present regimen.  Long discussion with patient and husband regarding patient's severity of her underlying COPD and lack of pulmonary reserve.  Physical deconditioning plays a big part of her shortness of breath.  Agreed to continue with physical therapy.  Continue on Stiolto.  And use her albuterol inhaler or nebulizer as needed.  Patient's medications were reviewed today and patient education was given. Computerized medication calendar was adjusted/completed   Plan  Patient Instructions  Discuss with Primary MD /Cardiology that Propranolol and Lisinopril may be aggravating your cough /shortness of breath .  Continue on Stiolto 2 puffs daily .  Mucinex DM Twice daily  As needed  Cough/congestion .  Albuterol Inhaler /Neb As needed  Wheezing .  Work on healthy weight loss.  Activity as tolerated.  Follow medication calendar closely and bring to each visit  Follow up with Dr. [name] 3 months and As needed   Please contact office for sooner follow up if symptoms do not improve or worsen or seek emergency care

## 2019-07-15 NOTE — Progress Notes (Signed)
@Patient  ID: Destiny Hughes, female    DOB: 10/12/1945, 74 y.o.   MRN: [id number]  Chief Complaint  Patient presents with  . Follow-up    COPD     Referring provider: Billie Ruddy, MD  HPI: 74 year old female former smoker followed for very severe COPD with emphysema.  Medical history significant for dementia, A. fib, chronic pain with nerve stimulator, hyponatremia  TEST/EVENTS :    07/15/2019 Follow up : COPD  Patient returns for a 1 week follow-up.  Patient has underlying very severe COPD with emphysema.  She is accompanied by her husband who helps with her medical care.  Patient's been having difficulty understanding which medication she is taking.  Patient is on [place].  Was having trouble affording but now has received patient assistance and will get Stiolto through the [facility] cares program .  We went through her medications and organize them into a medication count with patient education.  There are several discrepancies of her medicines versus our MAR.  She also is on an ACE inhibitor and high-dose propanolol.  I did discuss with patient and her husband that these medications may be aggravating her chronic ongoing cough.  Since last visit patient says she is doing okay.  She is going to physical therapy which seems to be helping some.  Patient's weight has slowly increased over the last 2 years and she is up 40 pounds.  Patient says that she loves to eat and has not been very active.  We discussed a healthy diet and activity to try to help her with weight loss.   Allergies  Allergen Reactions  . Albuterol Shortness Of Breath and Other (See Comments)  . Penicillins Anaphylaxis, Swelling and Other (See Comments)    Has patient had a PCN reaction causing immediate rash, facial/tongue/throat swelling, SOB or lightheadedness with hypotension: Yes Has patient had a PCN reaction causing severe rash involving mucus membranes or skin necrosis: Rash Has patient had a PCN reaction  that required hospitalization: No Has patient had a PCN reaction occurring within the last 10 years: No If all of the above answers are "NO", then may proceed with Cephalosporin use.  . Sulfa Antibiotics Swelling and Other (See Comments)    Throat swells, but no shortness of breath noted  . Codeine Nausea And Vomiting  . Ecotrin [Aspirin] Nausea And Vomiting  . Zanaflex [Tizanidine] Other (See Comments)    Possible confusion    Immunization History  Administered Date(s) Administered  . Fluad Quad(high Dose 65+) 12/17/2018  . Influenza, High Dose Seasonal PF 04/13/2017, 12/05/2017  . PFIZER SARS-COV-2 Vaccination 04/26/2019, 05/21/2019  . Pneumococcal Conjugate-13 10/03/2014  . Pneumococcal Polysaccharide-23 10/18/2011  . Td 03/21/2010    Past Medical History:  Diagnosis Date  . Arthritis    OA- knees & back  neck  . Chronic back pain   . Cognitive retention disorder   . Collapse of right lung   . COPD (chronic obstructive pulmonary disease) (HCC)    Hx. Bronchitis occ, 01-25-11 somes issue now taking  Z-pak-started 01-24-11/Scar tissue  present  from previous lung collapse  . Dementia with psychosis ([place])   . Depression   . Depression   . Dyspnea   . Dysrhythmia 2018   treated with Ablation   . GERD (gastroesophageal reflux disease)    tx. TUMS  . Glaucoma   . Headache   . History of kidney stones    surgically treated  . Hyperlipemia   . Hypertension   .  Hypothyroidism    tx. Levothyroxine  . Major depression with psychotic features ([place])   . Nephrolithiasis   . Neuromuscular disorder ([place]) 01-25-11   Pain/nerve stimulator implanted -2 yrs ago.  Marland Kitchen Reflex, gag, absent   . Reflex, gag, absent     Tobacco History: Social History   Tobacco Use  Smoking Status Former Smoker  . Packs/day: 0.75  . Years: 50.00  . Pack years: 37.50  . Types: Cigarettes  . Start date: 09/13/1961  . Quit date: 05/2016  . Years since quitting: 3.1  Smokeless Tobacco Never Used   Tobacco Comment   Decreased Down to 2/3 a Day if Thinking Abouit It   Counseling given: Not Answered Comment: Decreased Down to 2/3 a Day if Thinking Abouit It   Outpatient Medications Prior to Visit  Medication Sig Dispense Refill  . acetaminophen (TYLENOL) 325 MG tablet Take 650 mg by mouth every 6 (six) hours as needed (pain.).    Marland Kitchen albuterol (PROVENTIL) (2.5 MG/3ML) 0.083% nebulizer solution Take 3 mLs (2.5 mg total) by nebulization every 6 (six) hours as needed for wheezing or shortness of breath. 300 mL 11  . amLODipine (NORVASC) 5 MG tablet TAKE ONE TABLET DAILY 90 tablet 1  . arformoterol (BROVANA) 15 MCG/2ML NEBU Take 2 mLs (15 mcg total) by nebulization 2 (two) times daily. DX: J44.9 120 mL 6  . buPROPion (WELLBUTRIN XL) 150 MG 24 hr tablet Take 2 tablets (300 mg total) by mouth every morning. 60 tablet 6  . cyclobenzaprine (FLEXERIL) 10 MG tablet Take 1 tablet (10 mg total) by mouth at bedtime. 30 tablet 0  . donepezil (ARICEPT) 10 MG tablet Take 1 tablet (10 mg total) by mouth at bedtime. 30 tablet 5  . fluticasone (FLONASE) 50 MCG/ACT nasal spray 1 OR 2 SPRAYS IN NOSE ONCE A DAY 16 g 5  . furosemide (LASIX) 20 MG tablet TAKE ONE TABLET DAILY 30 tablet 0  . gabapentin (NEURONTIN) 100 MG capsule TAKE ONE CAPSULE TWICE A DAY 180 capsule 0  . ipratropium (ATROVENT) 0.02 % nebulizer solution Take 2.5 mLs (0.5 mg total) by nebulization every 6 (six) hours as needed.    Marland Kitchen ipratropium (ATROVENT) 0.03 % nasal spray Place 2 sprays into both nostrils every 12 (twelve) hours.    Marland Kitchen levalbuterol (XOPENEX) 0.63 MG/3ML nebulizer solution Take 3 mLs (0.63 mg total) by nebulization every 4 (four) hours as needed for wheezing or shortness of breath. 360 mL 5  . levothyroxine (SYNTHROID) 125 MCG tablet TAKE ONE TABLET AT BEDTIME 90 tablet 0  . lisinopril (ZESTRIL) 5 MG tablet Take 1 tablet (5 mg total) by mouth daily. 90 tablet 3  . loratadine (CLARITIN) 10 MG tablet Take 10 mg by mouth daily.     . memantine (NAMENDA) 5 MG tablet Take 1 tablet (5mg  total) at bedtime for 1 week. Then take 1 tablet (10mg  total) twice a day 60 tablet 5  . pantoprazole (PROTONIX) 40 MG tablet TAKE ONE TABLET DAILY 90 tablet 0  . propranolol (INDERAL) 80 MG tablet TAKE ONE-HALF TABLET 3 TIMES DAILY 135 tablet 1  . sodium chloride 1 g tablet TAKE ONE TABLET THREE TIMES DAILY 180 tablet 0  . temazepam (RESTORIL) 30 MG capsule Take 1 capsule (30 mg total) by mouth at bedtime. 30 capsule 4  . Tiotropium Bromide-Olodaterol (STIOLTO RESPIMAT) 2.5-2.5 MCG/ACT AERS Inhale 2 puffs into the lungs daily. 12 g 3  . Tiotropium Bromide-Olodaterol (STIOLTO RESPIMAT) 2.5-2.5 MCG/ACT AERS Inhale 2 puffs into  the lungs daily. 2 g 0   No facility-administered medications prior to visit.     Review of Systems:   Constitutional:   No  weight loss, night sweats,  Fevers, chills, + fatigue, or  lassitude.  HEENT:   No headaches,  Difficulty swallowing,  Tooth/dental problems, or  Sore throat,                No sneezing, itching, ear ache, nasal congestion, post nasal drip,   CV:  No chest pain,  Orthopnea, PND, swelling in lower extremities, anasarca, dizziness, palpitations, syncope.   GI  No heartburn, indigestion, abdominal pain, nausea, vomiting, diarrhea, change in bowel habits, loss of appetite, bloody stools.   Resp:    No chest wall deformity  Skin: no rash or lesions.  GU: no dysuria, change in color of urine, no urgency or frequency.  No flank pain, no hematuria   MS:  No joint pain or swelling.  No decreased range of motion.  No back pain.    Physical Exam  BP 122/86 (BP Location: Left Arm, Cuff Size: Large)   Pulse 60   Temp 97.7 F (36.5 C) (Temporal)   Ht 5\' 6"  (1.676 m)   Wt 198 lb (89.8 kg)   SpO2 95%   BMI 31.96 kg/m   GEN: A/Ox3; pleasant , NAD, elderly   HEENT:  [place]/AT,   NOSE-clear, THROAT-clear, no lesions, no postnasal drip or exudate noted.   NECK:  Supple w/ fair ROM; no JVD;  normal carotid impulses w/o bruits; no thyromegaly or nodules palpated; no lymphadenopathy.    RESP  Clear  P & A; w/o, wheezes/ rales/ or rhonchi. no accessory muscle use, no dullness to percussion  CARD:  RRR, no m/r/g, no peripheral edema, pulses intact, no cyanosis or clubbing.  GI:   Soft & nt; nml bowel sounds; no organomegaly or masses detected.   Musco: Warm bil, no deformities or joint swelling noted.   Neuro: alert, no focal deficits noted.    Skin: Warm, no lesions or rashes    Lab Results:  CBC  BMET    Imaging: No results found.    PFT Results Latest Ref Rng & Units 08/27/2015 12/26/2014  FVC-Pre L 1.09 1.23  FVC-Predicted Pre % 34 38  Pre FEV1/FVC % % 69 64  FEV1-Pre L 0.75 0.78  FEV1-Predicted Pre % 31 32  DLCO UNC% % 42 51  DLCO COR %Predicted % 60 71  TLC L - 4.96  TLC % Predicted % - 92  RV % Predicted % - 128    No results found for: NITRICOXIDE      Assessment & Plan:   COPD (chronic obstructive pulmonary disease) (HCC) Currently stable on present regimen.  Long discussion with patient and husband regarding patient's severity of her underlying COPD and lack of pulmonary reserve.  Physical deconditioning plays a big part of her shortness of breath.  Agreed to continue with physical therapy.  Continue on Stiolto.  And use her albuterol inhaler or nebulizer as needed.  Patient's medications were reviewed today and patient education was given. Computerized medication calendar was adjusted/completed   Plan  Patient Instructions  Discuss with Primary MD /Cardiology that Propranolol and Lisinopril may be aggravating your cough /shortness of breath .  Continue on Stiolto 2 puffs daily .  Mucinex DM Twice daily  As needed  Cough/congestion .  Albuterol Inhaler /Neb As needed  Wheezing .  Work on healthy weight loss.  Activity  as tolerated.  Follow medication calendar closely and bring to each visit  Follow up with Dr. [name] 3 months and As  needed   Please contact office for sooner follow up if symptoms do not improve or worsen or seek emergency care       Chronic cough Patient has underlying severe COPD.  And has a recurrent cough.  Would recommend an alternative to lisinopril and propanolol as they may be aggravating her cough shortness of breath and wheezing.  Plan  Patient Instructions  Discuss with Primary MD /Cardiology that Propranolol and Lisinopril may be aggravating your cough /shortness of breath .  Continue on Stiolto 2 puffs daily .  Mucinex DM Twice daily  As needed  Cough/congestion .  Albuterol Inhaler /Neb As needed  Wheezing .  Work on healthy weight loss.  Activity as tolerated.  Follow medication calendar closely and bring to each visit  Follow up with Dr. [name] 3 months and As needed   Please contact office for sooner follow up if symptoms do not improve or worsen or seek emergency care          Rexene Edison, NP 07/15/2019

## 2019-07-15 NOTE — Patient Instructions (Addendum)
Discuss with Primary MD /Cardiology that Propranolol and Lisinopril may be aggravating your cough /shortness of breath .  Continue on Stiolto 2 puffs daily .  Mucinex DM Twice daily  As needed  Cough/congestion .  Albuterol Inhaler /Neb As needed  Wheezing .  Work on healthy weight loss.  Activity as tolerated.  Follow medication calendar closely and bring to each visit  Follow up with Dr. [name] 3 months and As needed   Please contact office for sooner follow up if symptoms do not improve or worsen or seek emergency care

## 2019-07-17 ENCOUNTER — Encounter: Payer: Self-pay | Admitting: Rehabilitative and Restorative Service Providers"

## 2019-07-17 ENCOUNTER — Other Ambulatory Visit: Payer: Self-pay

## 2019-07-17 ENCOUNTER — Ambulatory Visit (INDEPENDENT_AMBULATORY_CARE_PROVIDER_SITE_OTHER): Payer: Medicare Other | Admitting: Rehabilitative and Restorative Service Providers"

## 2019-07-17 DIAGNOSIS — G8929 Other chronic pain: Secondary | ICD-10-CM | POA: Diagnosis not present

## 2019-07-17 DIAGNOSIS — R262 Difficulty in walking, not elsewhere classified: Secondary | ICD-10-CM | POA: Diagnosis not present

## 2019-07-17 DIAGNOSIS — M25561 Pain in right knee: Secondary | ICD-10-CM

## 2019-07-17 DIAGNOSIS — M6281 Muscle weakness (generalized): Secondary | ICD-10-CM

## 2019-07-17 NOTE — Patient Instructions (Signed)
Destiny Hughes was prescribed 50 quadriceps sets per day (5-10 at a time); sit to stand (slow eccentrics) 15-20/day and scapular retractions 25/day throughout the day.

## 2019-07-17 NOTE — Therapy (Signed)
[facility] [address] [place], [place], [address] Phone: [phone]   Fax:  [phone]  Physical Therapy Treatment  Patient Details  Name: Destiny Hughes MRN: [id number] Date of Birth: 04/07/1945 Referring Provider (PT): Rodell Perna   Encounter Date: 07/17/2019  PT End of Session - 07/17/19 1456    Visit Number  3    Number of Visits  18    Date for PT Re-Evaluation  08/21/19    Progress Note Due on Visit  10    PT Start Time  1400    PT Stop Time  1445    PT Time Calculation (min)  45 min    Activity Tolerance  Patient limited by fatigue;Patient tolerated treatment well    Behavior During Therapy  [facility] for tasks assessed/performed       Past Medical History:  Diagnosis Date  . Arthritis    OA- knees & back  neck  . Chronic back pain   . Cognitive retention disorder   . Collapse of right lung   . COPD (chronic obstructive pulmonary disease) (HCC)    Hx. Bronchitis occ, 01-25-11 somes issue now taking  Z-pak-started 01-24-11/Scar tissue  present  from previous lung collapse  . Dementia with psychosis ([place])   . Depression   . Depression   . Dyspnea   . Dysrhythmia 2018   treated with Ablation   . GERD (gastroesophageal reflux disease)    tx. TUMS  . Glaucoma   . Headache   . History of kidney stones    surgically treated  . Hyperlipemia   . Hypertension   . Hypothyroidism    tx. Levothyroxine  . Major depression with psychotic features ([place])   . Nephrolithiasis   . Neuromuscular disorder ([place]) 01-25-11   Pain/nerve stimulator implanted -2 yrs ago.  Marland Kitchen Reflex, gag, absent   . Reflex, gag, absent     Past Surgical History:  Procedure Laterality Date  . A-FLUTTER ABLATION N/A 08/16/2016   Procedure: A-Flutter Ablation;  Surgeon: Evans Lance, MD;  Location: [place] [facility];  Service: Cardiovascular;  Laterality: N/A;  . ANTERIOR CERVICAL DECOMP/DISCECTOMY FUSION     3 levels, per family  . APPENDECTOMY  01-25-11   . CARPAL TUNNEL RELEASE  01-25-11   Bil.  . CERVICAL SPINE SURGERY  01-25-11   2005-multiple levels  . CHOLECYSTECTOMY  01-25-11   '07  . COLONOSCOPY WITH PROPOFOL N/A 07/23/2012   Procedure: COLONOSCOPY WITH PROPOFOL;  Surgeon: Garlan Fair, MD;  Location: [facility];  Service: Endoscopy;  Laterality: N/A;  . detatched retna Bilateral    done July & August- 2019  . EYE SURGERY Bilateral 2019   repair of retina detachment- Dr. Zadie Rhine at surgical center   . JOINT REPLACEMENT  01-25-11   6'03 LTHA-hemi  . KNEE ARTHROPLASTY  01-25-11   '04-right, revised x2  . no gag reflex     Pt does not havea gag reflex following  siurgery to neck. Family unsure of date. Pt takes pill in apple sauce.  . SPINAL CORD STIMULATOR BATTERY EXCHANGE N/A 01/12/2018   Procedure: Spinal cord stimulator battery replacement;  Surgeon: Clydell Hakim, MD;  Location: [place];  Service: Neurosurgery;  Laterality: N/A;  left  . SPINAL CORD STIMULATOR IMPLANT  2009 APPROX   DR. ELSNER  . SPINAL CORD STIMULATOR REMOVAL N/A 06/04/2018   Procedure: Removal of spinal cord stimulator, thoracic paddle and generator;  Surgeon: Kristeen Miss, MD;  Location: [place];  Service: Neurosurgery;  Laterality: N/A;  . THORACIC SPINE SURGERY  01-25-11   6'02- then nerve stimulator implanted after  . TOTAL KNEE REVISION  02/01/2011   Procedure: TOTAL KNEE REVISION;  Surgeon: Mauri Pole;  Location: [facility];  Service: Orthopedics;  Laterality: Right;  . TUBAL LIGATION      There were no vitals filed for this visit.  Subjective Assessment - 07/17/19 1453    Subjective  Destiny Hughes reports less knee pain today as compared to evaluation.  HEP compliance does need to improve.    Patient is accompained by:  Family member    Pertinent History  TKA 2012 with 2 revisions; L THA hemi; cervical spine surgery (ACDF); back and knee OA; some dementia; COPD; dyspnea; HTN; atrial flutter ablation; thoracic spine surgery; spinal stimulatorYates no  follow-up.  0/115 bilaterally    Limitations  Standing;Walking;House hold activities;Other (comment)    How long can you sit comfortably?  Hour    How long can you stand comfortably?  5-10 minutes at best    How long can you walk comfortably?  Household distances    Patient Stated Goals  Walk better without pain.    Currently in Pain?  Yes    Pain Score  5     Pain Location  Knee    Pain Orientation  Right    Pain Descriptors / Indicators  Sore;Tightness    Pain Type  Chronic pain    Pain Onset  More than a month ago    Pain Frequency  Constant    Aggravating Factors   Weight-bearing function    Pain Relieving Factors  Rest    Effect of Pain on Daily Activities  Destiny Hughes has been limited with all standing and walking ADLs.    Multiple Pain Sites  No                       [facility] Adult PT Treatment/Exercise - 07/17/19 0001      Exercises   Exercises  Knee/Hip      Knee/Hip Exercises: Aerobic   Recumbent Bike  Seat 4 8 minutes      Knee/Hip Exercises: Machines for Strengthening   Total Gym Leg Press  75# 3 sets of 10      Knee/Hip Exercises: Seated   Other Seated Knee/Hip Exercises  Heel raises 10X 3 seconds    Other Seated Knee/Hip Exercises  Shoulder blade squeezes to improve posture x10 with 5 sec hold    Sit to Sand  10 reps   slow eccentrics     Knee/Hip Exercises: Supine   Quad Sets  4 sets;5 reps;Both   5 seconds (E-stim next visit)            PT Education - 07/17/19 1456    Education Details  Updated and printed out her HEP    Person(s) Educated  Patient;Spouse    Methods  Explanation;Demonstration;Tactile cues;Verbal cues;Handout    Comprehension  Verbalized understanding;Returned demonstration;Verbal cues required;Tactile cues required;Need further instruction       PT Short Term Goals - 07/10/19 1238      PT SHORT TERM GOAL #1   Title  Destiny Hughes will demonstrate competence with her beginning (2 exercise) HEP.    Time  2    Period  Weeks     Status  New    Target Date  07/24/19        PT Long Term Goals - 07/10/19 1239  PT LONG TERM GOAL #1   Title  Destiny Hughes will report R knee pain consistently 0-4/10 on the Numeric Pain Rating Scale (7/10 at evaluation).    Baseline  7/10    Time  6    Period  Weeks    Status  New    Target Date  08/21/19      PT LONG TERM GOAL #2   Title  Destiny Hughes will be able to stand and walk for at least 10 minutes, allowing her to participate in activities she currently avoids.    Baseline  Avoids leaving the house unless required.    Time  6    Period  Weeks    Status  New    Target Date  08/21/19      PT LONG TERM GOAL #3   Title  Improve R quadriceps strength to at least 4+/5.    Baseline  3/5    Time  6    Period  Weeks    Status  New    Target Date  08/21/19      PT LONG TERM GOAL #4   Title  Destiny Hughes will be independent with her long-term strength and general conditioning HEP at DC.    Time  6    Period  Weeks    Status  New    Target Date  08/21/19            Plan - 07/17/19 1457    Clinical Impression Statement  Destiny Hughes reports subjective progress even with little HEP compliance.  She was given 3 exercises to complete at home (sit to stand, shoulder blade pinches and quadriceps sets) to facilitate improving her standing and walking function (quadriceps strength, general conditioning and posture).  She will benefit from progressions to her strength and conditioning program as she progresses through her supervised PT.    PT Next Visit Plan  Continue general conditioning with emphasis on LE strengthening and posture so she can return to more fully participating with household chores.    PT Home Exercise Plan  Quad sets; SBP; sit to stand    Consulted and Agree with Plan of Care  Family member/caregiver;Patient       Patient will benefit from skilled therapeutic intervention in order to improve the following deficits and impairments:     Visit Diagnosis: Difficulty in  walking, not elsewhere classified  Chronic pain of right knee  Muscle weakness (generalized)     Problem List Patient Active Problem List   Diagnosis Date Noted  . History of fusion of cervical spine 07/03/2019  . Spinal cord stimulator dysfunction ([place]) 06/04/2018  . Chronic cough 05/21/2018  . Tremor 02/16/2018  . Chronic rhinitis 02/05/2018  . Hallucinations   . Psychosis ([place]) 11/07/2016  . Acute respiratory failure with hypoxia and hypercapnia ([place]) 11/03/2016  . Other chest pain 10/23/2016  . Tobacco abuse 10/23/2016  . Major depression with psychotic features ([place]) 10/19/2016  . Dementia with behavioral disturbance ([place]) 10/19/2016  . Depression due to physical illness   . Cognitive retention disorder   . Atrial flutter ([place]) 08/15/2016  . Paroxysmal atrial fibrillation ([place]) 08/15/2016  . SVT (supraventricular tachycardia) ([place]) 07/11/2016  . Weight loss 07/08/2016  . AKI (acute kidney injury) ([place]) 07/02/2016  . Acute kidney injury ([place]) 07/01/2016  . Hyperglycemia 07/01/2016  . Hyponatremia 07/01/2016  . Intractable nausea and vomiting 07/01/2016  . Abnormal EKG 07/01/2016  . Diastolic CHF ([place]) [id number]  . Medication management 04/28/2015  .  Emphysema lung ([place]) 12/26/2014  . Gastroesophageal reflux disease without esophagitis 12/26/2014  . Chronic bronchitis ([place]) 11/13/2014  . COPD (chronic obstructive pulmonary disease) ([place]) 10/03/2014  . At high risk for falls 10/03/2014  . Other specified hypothyroidism 10/03/2014  . Mitral valve prolapse 10/03/2014  . Encounter for Medicare annual wellness exam 10/03/2014  . IBS (irritable bowel syndrome) 04/01/2014  . S/P total knee replacement 04/22/2013  . Knee pain, chronic 04/22/2013  . Essential hypertension 04/15/2013  . Vitamin D deficiency 04/15/2013  . Encounter for long-term (current) use of medications 04/15/2013  . Prediabetes 04/15/2013  . Osteoarthritis 01/31/2011  . Hyperlipemia   . Glaucoma    . Chronic pain disorder   . Chronic back pain     Farley Ly PT, MPT 07/17/2019, 3:01 PM  [facility] [address] [place], [place], [address] Phone: [phone]   Fax:  [phone]  Name: Destiny Hughes MRN: [id number] Date of Birth: 07-27-1945

## 2019-07-19 ENCOUNTER — Other Ambulatory Visit: Payer: Self-pay

## 2019-07-19 ENCOUNTER — Telehealth: Payer: Self-pay | Admitting: Family Medicine

## 2019-07-19 ENCOUNTER — Encounter: Payer: Self-pay | Admitting: Physical Therapy

## 2019-07-19 ENCOUNTER — Ambulatory Visit (INDEPENDENT_AMBULATORY_CARE_PROVIDER_SITE_OTHER): Payer: Medicare Other | Admitting: Physical Therapy

## 2019-07-19 ENCOUNTER — Telehealth: Payer: Self-pay | Admitting: Adult Health

## 2019-07-19 DIAGNOSIS — R262 Difficulty in walking, not elsewhere classified: Secondary | ICD-10-CM

## 2019-07-19 DIAGNOSIS — M6281 Muscle weakness (generalized): Secondary | ICD-10-CM | POA: Diagnosis not present

## 2019-07-19 DIAGNOSIS — G8929 Other chronic pain: Secondary | ICD-10-CM | POA: Diagnosis not present

## 2019-07-19 DIAGNOSIS — M25561 Pain in right knee: Secondary | ICD-10-CM | POA: Diagnosis not present

## 2019-07-19 NOTE — Telephone Encounter (Signed)
Plan from pt's OV with TP 4/26 Patient Instructions  Discuss with Primary MD /Cardiology that Propranolol and Lisinopril may be aggravating your cough /shortness of breath. Would recommend an alternative to lisinopril and propanolol as they may be aggravating her cough shortness of breath and wheezing.    Called and spoke with pt's husband [name] and stated to him that we would reach out to Dr. Volanda Napoleon and Dr. Lovena Le to see if they can recommend an alternative for both the lisinopril and propranolol. He verbalized understanding. I have sent a message both to Dr. Volanda Napoleon and Dr. Lovena Le as pt's propranolol was prescribed by Dr. Volanda Napoleon and the lisinopril was prescribed by Dr. Lovena Le.  Will keep encounter open and await a response.

## 2019-07-19 NOTE — Therapy (Addendum)
[facility] [address] [place], [place], [address] Phone: [phone]   Fax:  [phone]  Physical Therapy Treatment/Discharge Summary  Patient Details  Name: Destiny Hughes MRN: [id number] Date of Birth: 1945/12/07 Referring Provider (PT): Rodell Perna   Encounter Date: 07/19/2019  PT End of Session - 07/19/19 1445    Visit Number  4    Number of Visits  18    Date for PT Re-Evaluation  08/21/19    Progress Note Due on Visit  10    PT Start Time  4665    PT Stop Time  1525    PT Time Calculation (min)  40 min    Activity Tolerance  Patient limited by fatigue;Patient tolerated treatment well    Behavior During Therapy  [facility] for tasks assessed/performed       Past Medical History:  Diagnosis Date  . Arthritis    OA- knees & back  neck  . Chronic back pain   . Cognitive retention disorder   . Collapse of right lung   . COPD (chronic obstructive pulmonary disease) (HCC)    Hx. Bronchitis occ, 01-25-11 somes issue now taking  Z-pak-started 01-24-11/Scar tissue  present  from previous lung collapse  . Dementia with psychosis ([place])   . Depression   . Depression   . Dyspnea   . Dysrhythmia 2018   treated with Ablation   . GERD (gastroesophageal reflux disease)    tx. TUMS  . Glaucoma   . Headache   . History of kidney stones    surgically treated  . Hyperlipemia   . Hypertension   . Hypothyroidism    tx. Levothyroxine  . Major depression with psychotic features ([place])   . Nephrolithiasis   . Neuromuscular disorder ([place]) 01-25-11   Pain/nerve stimulator implanted -2 yrs ago.  Marland Kitchen Reflex, gag, absent   . Reflex, gag, absent     Past Surgical History:  Procedure Laterality Date  . A-FLUTTER ABLATION N/A 08/16/2016   Procedure: A-Flutter Ablation;  Surgeon: Evans Lance, MD;  Location: [facility];  Service: Cardiovascular;  Laterality: N/A;  . ANTERIOR CERVICAL DECOMP/DISCECTOMY FUSION     3 levels, per family  .  APPENDECTOMY  01-25-11  . CARPAL TUNNEL RELEASE  01-25-11   Bil.  . CERVICAL SPINE SURGERY  01-25-11   2005-multiple levels  . CHOLECYSTECTOMY  01-25-11   '07  . COLONOSCOPY WITH PROPOFOL N/A 07/23/2012   Procedure: COLONOSCOPY WITH PROPOFOL;  Surgeon: Garlan Fair, MD;  [facility];  Service: Endoscopy;  Laterality: N/A;  . detatched retna Bilateral    done July & August- 2019  . EYE SURGERY Bilateral 2019   repair of retina detachment- Dr. Zadie Rhine at surgical center   . JOINT REPLACEMENT  01-25-11   6'03 LTHA-hemi  . KNEE ARTHROPLASTY  01-25-11   '04-right, revised x2  . no gag reflex     Pt does not havea gag reflex following  siurgery to neck. Family unsure of date. Pt takes pill in apple sauce.  . SPINAL CORD STIMULATOR BATTERY EXCHANGE N/A 01/12/2018   Procedure: Spinal cord stimulator battery replacement;  Surgeon: Clydell Hakim, MD;  Location: [facility];  Service: Neurosurgery;  Laterality: N/A;  left  . SPINAL CORD STIMULATOR IMPLANT  2009 APPROX   DR. ELSNER  . SPINAL CORD STIMULATOR REMOVAL N/A 06/04/2018   Procedure: Removal of spinal cord stimulator, thoracic paddle and generator;  Surgeon: Kristeen Miss, MD;  Location: [facility];  Service: Neurosurgery;  Laterality: N/A;  . THORACIC SPINE SURGERY  01-25-11   6'02- then nerve stimulator implanted after  . TOTAL KNEE REVISION  02/01/2011   Procedure: TOTAL KNEE REVISION;  Surgeon: Mauri Pole;  [facility];  Service: Orthopedics;  Laterality: Right;  . TUBAL LIGATION      There were no vitals filed for this visit.  Subjective Assessment - 07/19/19 1444    Subjective  Patient reports right knee continues to bother her with all activity but she has gotten used to it and just pushes through it.    Patient is accompained by:  Family member   Husband   Patient Stated Goals  Walk better without pain.    Currently in Pain?  Yes    Pain Score  5     Pain Location  Knee    Pain Orientation  Right    Pain Descriptors  / Indicators  Sore;Tightness    Pain Type  Chronic pain    Pain Onset  More than a month ago    Pain Frequency  Constant                       [facility] Adult PT Treatment/Exercise - 07/19/19 0001      Exercises   Exercises  Knee/Hip      Knee/Hip Exercises: Aerobic   Recumbent Bike  L1 x 8 min (seat 4)      Knee/Hip Exercises: Seated   Long Arc Quad  3 sets;10 reps    Long Arc Quad Weight  1 lbs.    Other Seated Knee/Hip Exercises  Heel/toe raises 2x15 with 1# ankle weights    Other Seated Knee/Hip Exercises  Shoulder blade squeezes 2x10 with 2 sec hold    Sit to Sand  3 sets;5 reps   hands on thighs     Knee/Hip Exercises: Supine   Quad Sets  10 reps   5 seconds   Heel Slides  2 sets;10 reps    Straight Leg Raises  2 sets;5 sets    Other Supine Knee/Hip Exercises  Clamshell with red band 2x15             PT Education - 07/19/19 1444    Education Details  HEP    Person(s) Educated  Patient    Methods  Explanation;Demonstration;Verbal cues;Tactile cues    Comprehension  Verbalized understanding;Returned demonstration;Verbal cues required;Tactile cues required;Need further instruction       PT Short Term Goals - 07/10/19 1238      PT SHORT TERM GOAL #1   Title  Shameca will demonstrate competence with her beginning (2 exercise) HEP.    Time  2    Period  Weeks    Status  New    Target Date  07/24/19        PT Long Term Goals - 07/10/19 1239      PT LONG TERM GOAL #1   Title  Dalinda will report R knee pain consistently 0-4/10 on the Numeric Pain Rating Scale (7/10 at evaluation).    Baseline  7/10    Time  6    Period  Weeks    Status  New    Target Date  08/21/19      PT LONG TERM GOAL #2   Title  Elanda will be able to stand and walk for at least 10 minutes, allowing her to participate in activities she currently avoids.  Baseline  Avoids leaving the house unless required.    Time  6    Period  Weeks    Status  New    Target Date   08/21/19      PT LONG TERM GOAL #3   Title  Improve R quadriceps strength to at least 4+/5.    Baseline  3/5    Time  6    Period  Weeks    Status  New    Target Date  08/21/19      PT LONG TERM GOAL #4   Title  Shaden will be independent with her long-term strength and general conditioning HEP at DC.    Time  6    Period  Weeks    Status  New    Target Date  08/21/19            Plan - 07/19/19 1445    Clinical Impression Statement  Patient tolerated therapy well with no adverse effects. She continues to report right knee pain with all activity and is limited by shortness of breath with activity. She seems to be progressing with strengthening exercises and able to do more in therapy but her husband reports she does not perform exercises at home consistently. She would benefit from continued skilled PT to progress strengthening and walking ability to maximize functional level and improve independence with ADLs.    PT Treatment/Interventions  ADLs/Self Care Home Management;Aquatic Therapy;Cryotherapy;Scientist, product/process development;Neuromuscular re-education;Balance training;Therapeutic exercise;Therapeutic activities;Functional mobility training;Stair training;Gait training;Patient/family education;Manual techniques    PT Next Visit Plan  Continue general conditioning with emphasis on LE strengthening and posture so she can return to more fully participating with household chores.    PT Home Exercise Plan  Quad sets; SBP; sit to stand    Consulted and Agree with Plan of Care  Family member/caregiver;Patient    Family Member Consulted  Husband       Patient will benefit from skilled therapeutic intervention in order to improve the following deficits and impairments:  Abnormal gait, Difficulty walking, Obesity, Decreased endurance, Cardiopulmonary status limiting activity, Decreased activity tolerance, Impaired perceived functional ability, Pain, Decreased mobility, Decreased  strength, Increased edema, Postural dysfunction  Visit Diagnosis: Difficulty in walking, not elsewhere classified  Chronic pain of right knee  Muscle weakness (generalized)     Problem List Patient Active Problem List   Diagnosis Date Noted  . History of fusion of cervical spine 07/03/2019  . Spinal cord stimulator dysfunction ([place]) 06/04/2018  . Chronic cough 05/21/2018  . Tremor 02/16/2018  . Chronic rhinitis 02/05/2018  . Hallucinations   . Psychosis ([place]) 11/07/2016  . Acute respiratory failure with hypoxia and hypercapnia ([place]) 11/03/2016  . Other chest pain 10/23/2016  . Tobacco abuse 10/23/2016  . Major depression with psychotic features ([place]) 10/19/2016  . Dementia with behavioral disturbance ([place]) 10/19/2016  . Depression due to physical illness   . Cognitive retention disorder   . Atrial flutter ([place]) 08/15/2016  . Paroxysmal atrial fibrillation ([place]) 08/15/2016  . SVT (supraventricular tachycardia) ([place]) 07/11/2016  . Weight loss 07/08/2016  . AKI (acute kidney injury) ([place]) 07/02/2016  . Acute kidney injury ([place]) 07/01/2016  . Hyperglycemia 07/01/2016  . Hyponatremia 07/01/2016  . Intractable nausea and vomiting 07/01/2016  . Abnormal EKG 07/01/2016  . Diastolic CHF ([place]) 42/59/5638  . Medication management 04/28/2015  . Emphysema lung ([place]) 12/26/2014  . Gastroesophageal reflux disease without esophagitis 12/26/2014  . Chronic bronchitis ([place]) 11/13/2014  . COPD (chronic obstructive pulmonary  disease) ([place]) 10/03/2014  . At high risk for falls 10/03/2014  . Other specified hypothyroidism 10/03/2014  . Mitral valve prolapse 10/03/2014  . Encounter for Medicare annual wellness exam 10/03/2014  . IBS (irritable bowel syndrome) 04/01/2014  . S/P total knee replacement 04/22/2013  . Knee pain, chronic 04/22/2013  . Essential hypertension 04/15/2013  . Vitamin D deficiency 04/15/2013  . Encounter for long-term (current) use of medications 04/15/2013  .  Prediabetes 04/15/2013  . Osteoarthritis 01/31/2011  . Hyperlipemia   . Glaucoma   . Chronic pain disorder   . Chronic back pain     Hilda Blades, PT, DPT, LAT, ATC 07/19/19  3:30 PM Phone: [phone] Fax: [facility] [address] [place], [place], [address] Phone: [phone]   Fax:  [phone]  Name: PECOLIA MARANDO MRN: [id number] Date of Birth: Jul 28, 1945    PHYSICAL THERAPY DISCHARGE SUMMARY  Visits from Start of Care: 4  Current functional level related to goals / functional outcomes: See above   Remaining deficits: unknown   Education / Equipment: HEP  Plan: Patient agrees to discharge.  Patient goals were not met. Patient is being discharged due to not returning since the last visit.  ?????    Laureen Abrahams, PT, DPT 10/31/19 11:47 AM  [facility] [address] [place], [place], [address] Phone: [phone]   Fax:  [phone]

## 2019-07-19 NOTE — Telephone Encounter (Signed)
error 

## 2019-07-19 NOTE — Telephone Encounter (Signed)
[name] husband would like to be updated that this can or cannot happen

## 2019-07-22 ENCOUNTER — Encounter: Payer: Medicare Other | Admitting: Rehabilitative and Restorative Service Providers"

## 2019-07-25 ENCOUNTER — Other Ambulatory Visit (HOSPITAL_COMMUNITY): Payer: Self-pay

## 2019-07-25 ENCOUNTER — Encounter: Payer: Medicare Other | Admitting: Physical Therapy

## 2019-07-25 MED ORDER — TEMAZEPAM 30 MG PO CAPS: 30.0000 mg | ORAL_CAPSULE | Freq: Every day | ORAL | 0 refills | Status: DC

## 2019-07-29 ENCOUNTER — Encounter: Payer: Medicare Other | Admitting: Physical Therapy

## 2019-07-31 ENCOUNTER — Encounter: Payer: Medicare Other | Admitting: Physical Therapy

## 2019-07-31 ENCOUNTER — Telehealth (INDEPENDENT_AMBULATORY_CARE_PROVIDER_SITE_OTHER): Payer: Medicare Other | Admitting: Psychiatry

## 2019-07-31 ENCOUNTER — Other Ambulatory Visit: Payer: Self-pay

## 2019-07-31 DIAGNOSIS — F325 Major depressive disorder, single episode, in full remission: Secondary | ICD-10-CM

## 2019-07-31 MED ORDER — DONEPEZIL HCL 10 MG PO TABS: 10.0000 mg | ORAL_TABLET | Freq: Every day | ORAL | 5 refills | Status: DC

## 2019-07-31 MED ORDER — TEMAZEPAM 30 MG PO CAPS: 30.0000 mg | ORAL_CAPSULE | Freq: Every day | ORAL | 5 refills | Status: DC

## 2019-07-31 MED ORDER — BUPROPION HCL ER (XL) 150 MG PO TB24: 300.0000 mg | ORAL_TABLET | ORAL | 6 refills | Status: DC

## 2019-07-31 MED ORDER — TEMAZEPAM 30 MG PO CAPS: 30.0000 mg | ORAL_CAPSULE | Freq: Every day | ORAL | 0 refills | Status: DC

## 2019-07-31 NOTE — Progress Notes (Signed)
Psychiatric Initial Adult Assessment   Patient Identification: Destiny Hughes MRN:  [id number] Date of Evaluation:  07/31/2019 Referral Source: Destiny Hughes emergency room Chief Complaint: Major depression remission  Today the patient is doing well.  Today we spoke to her and her husband Destiny Hughes.  The patient's mood is great.  She is functioning very well.  She denies daily depression.  She is enjoying watching TV reading a book and playing computer games.  She sleeps and eats very well.  She is got good energy.  She denies any chest discomfort or shortness of breath but she is experiencing a chest cold.  This causes some congestion but it does not cause her that much respiratory distress.  Financially she is doing well.  She is getting along with her family very well.  She demonstrates no evidence of psychosis.  She seems to be concentrating fairly well and she has a good sense of worth and value as a person.  She drinks no alcohol uses no drugs.  Today we clarified the medication she was on and clarified again to her that she is experiencing most likely that of a dementing disorder most likely that of Alzheimer's.  Patient continues taking 10 mg of Aricept for this condition.  Overall the patient is functioning well.  She has no complaints.  (Hypo) Manic Symptoms:   Anxiety Symptoms:   Psychotic Symptoms:   PTSD Symptoms:   Past Psychiatric History: none  Previous Psychotropic Medications: Yes   Substance Abuse History in the last 12 months:  No.  Consequences of Substance Abuse:   Past Medical History:  Past Medical History:  Diagnosis Date  . Arthritis    OA- knees & back  neck  . Chronic back pain   . Cognitive retention disorder   . Collapse of right lung   . COPD (chronic obstructive pulmonary disease) (HCC)    Hx. Bronchitis occ, 01-25-11 somes issue now taking  Z-pak-started 01-24-11/Scar tissue  present  from previous lung collapse  . Dementia with psychosis ([place])   .  Depression   . Depression   . Dyspnea   . Dysrhythmia 2018   treated with Ablation   . GERD (gastroesophageal reflux disease)    tx. TUMS  . Glaucoma   . Headache   . History of kidney stones    surgically treated  . Hyperlipemia   . Hypertension   . Hypothyroidism    tx. Levothyroxine  . Major depression with psychotic features ([place])   . Nephrolithiasis   . Neuromuscular disorder ([place]) 01-25-11   Pain/nerve stimulator implanted -2 yrs ago.  Marland Kitchen Reflex, gag, absent   . Reflex, gag, absent     Past Surgical History:  Procedure Laterality Date  . A-FLUTTER ABLATION N/A 08/16/2016   Procedure: A-Flutter Ablation;  Surgeon: Destiny Lance, MD;  Location: [facility];  Service: Cardiovascular;  Laterality: N/A;  . ANTERIOR CERVICAL DECOMP/DISCECTOMY FUSION     3 levels, per family  . APPENDECTOMY  01-25-11  . CARPAL TUNNEL RELEASE  01-25-11   Bil.  . CERVICAL SPINE SURGERY  01-25-11   2005-multiple levels  . CHOLECYSTECTOMY  01-25-11   '07  . COLONOSCOPY WITH PROPOFOL N/A 07/23/2012   Procedure: COLONOSCOPY WITH PROPOFOL;  Surgeon: Destiny Fair, MD;  [facility];  Service: Endoscopy;  Laterality: N/A;  . detatched retna Bilateral    done July & August- 2019  . EYE SURGERY Bilateral 2019   repair of retina detachment-  Dr. Zadie Hughes at surgical center   . JOINT REPLACEMENT  01-25-11   6'03 LTHA-hemi  . KNEE ARTHROPLASTY  01-25-11   '04-right, revised x2  . no gag reflex     Pt does not havea gag reflex following  siurgery to neck. Family unsure of date. Pt takes pill in apple sauce.  . SPINAL CORD STIMULATOR BATTERY EXCHANGE N/A 01/12/2018   Procedure: Spinal cord stimulator battery replacement;  Surgeon: Destiny Hakim, MD;  Location: [facility];  Service: Neurosurgery;  Laterality: N/A;  left  . SPINAL CORD STIMULATOR IMPLANT  2009 APPROX   Destiny Hughes  . SPINAL CORD STIMULATOR REMOVAL N/A 06/04/2018   Procedure: Removal of spinal cord stimulator, thoracic paddle and  generator;  Surgeon: Destiny Miss, MD;  Location: [facility];  Service: Neurosurgery;  Laterality: N/A;  . THORACIC SPINE SURGERY  01-25-11   6'02- then nerve stimulator implanted after  . TOTAL KNEE REVISION  02/01/2011   Procedure: TOTAL KNEE REVISION;  Surgeon: Destiny Hughes;  [facility];  Service: Orthopedics;  Laterality: Right;  . TUBAL LIGATION      Family Psychiatric History:   Family History:  Family History  Problem Relation Age of Onset  . Heart attack Father   . Emphysema Father   . Aneurysm Mother        CEREBRAL  . Emphysema Mother   . Dementia Mother   . Diabetes Son   . Hypertension Son   . Hypertension Son   . Cancer Sister        Widely Metastatic  . Asthma Son        x2  . Breast cancer Paternal Aunt   . Rheum arthritis Maternal Aunt     Social History:   Social History   Socioeconomic History  . Marital status: Married    Spouse name: Not on file  . Number of children: 2  . Years of education: Not on file  . Highest education level: Not on file  Occupational History  . Occupation: disabled  Tobacco Use  . Smoking status: Former Smoker    Packs/day: 0.75    Years: 50.00    Pack years: 37.50    Types: Cigarettes    Start date: 09/13/1961    Quit date: 05/2016    Years since quitting: 3.2  . Smokeless tobacco: Never Used  . Tobacco comment: Decreased Down to 2/3 a Day if Thinking [place] It  Substance and Sexual Activity  . Alcohol use: No    Alcohol/week: 0.0 standard drinks    Comment: none in 10 years  . Drug use: No  . Sexual activity: Never  Other Topics Concern  . Not on file  Social History Narrative   She is from [place]. She has always lived in [place]. She has traveled to [place], [place], [place], [place], & [place]. 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 Prior exposure to parakeets with last exposure remote in her current home. Previously  had mold in her home that was in her bathroom & fixed. Prior exposure to hot tubs but none recently. Pt lives at home with Destiny Hughes Saxon, husband.  Has 2 childrean, 12th grade education.     Social Determinants of Health   Financial Resource Strain:   . Difficulty of Paying Living Expenses:   Food Insecurity:   . Worried About Charity fundraiser in  the Last Year:   . [place] in the Last Year:   Transportation Needs:   . Film/video editor (Medical):   Marland Kitchen Lack of Transportation (Non-Medical):   Physical Activity:   . Days of Exercise per Week:   . Minutes of Exercise per Session:   Stress:   . Feeling of Stress :   Social Connections:   . Frequency of Communication with Friends and Family:   . Frequency of Social Gatherings with Friends and Family:   . Attends Religious Services:   . Active Member of Clubs or Organizations:   . Attends Archivist Meetings:   Marland Kitchen Marital Status:     Additional Social History:   Allergies:   Allergies  Allergen Reactions  . Albuterol Shortness Of Breath and Other (See Comments)  . Penicillins Anaphylaxis, Swelling and Other (See Comments)    Has patient had a PCN reaction causing immediate rash, facial/tongue/throat swelling, SOB or lightheadedness with hypotension: Yes Has patient had a PCN reaction causing severe rash involving mucus membranes or skin necrosis: Rash Has patient had a PCN reaction that required hospitalization: No Has patient had a PCN reaction occurring within the last 10 years: No If all of the above answers are "NO", then may proceed with Cephalosporin use.  . Sulfa Antibiotics Swelling and Other (See Comments)    Throat swells, but no shortness of breath noted  . Codeine Nausea And Vomiting  . Ecotrin [Aspirin] Nausea And Vomiting  . Zanaflex [Tizanidine] Other (See Comments)    Possible confusion    Metabolic Disorder Labs: Lab Results  Component Value Date   HGBA1C 7.3 (H) 02/21/2019   MPG 137 (H)  04/28/2015   MPG 134 (H) 05/21/2014   No results found for: PROLACTIN Lab Results  Component Value Date   CHOL 297 (H) 02/21/2019   TRIG 264.0 (H) 02/21/2019   HDL 59.90 02/21/2019   CHOLHDL 5 02/21/2019   VLDL 52.8 (H) 02/21/2019   LDLCALC 114 04/28/2015   LDLCALC 114 04/28/2015     Current Medications: Current Outpatient Medications  Medication Sig Dispense Refill  . albuterol (PROAIR HFA) 108 (90 Base) MCG/ACT inhaler Inhale 2 puffs into the lungs every 6 (six) hours as needed for wheezing or shortness of breath. 6.7 g 5  . albuterol (PROVENTIL) (2.5 MG/3ML) 0.083% nebulizer solution Take 3 mLs (2.5 mg total) by nebulization every 6 (six) hours as needed for wheezing or shortness of breath. 300 mL 11  . amLODipine (NORVASC) 5 MG tablet TAKE ONE TABLET DAILY 90 tablet 1  . buPROPion (WELLBUTRIN XL) 150 MG 24 hr tablet Take 2 tablets (300 mg total) by mouth every morning. 60 tablet 6  . cyclobenzaprine (FLEXERIL) 10 MG tablet Take 1 tablet (10 mg total) by mouth at bedtime. 30 tablet 0  . donepezil (ARICEPT) 10 MG tablet Take 1 tablet (10 mg total) by mouth at bedtime. 30 tablet 5  . fluticasone (FLONASE) 50 MCG/ACT nasal spray 1 OR 2 SPRAYS IN NOSE ONCE A DAY 16 g 5  . gabapentin (NEURONTIN) 100 MG capsule TAKE ONE CAPSULE TWICE A DAY 180 capsule 0  . ipratropium (ATROVENT) 0.03 % nasal spray Place 2 sprays into both nostrils every 12 (twelve) hours.    Marland Kitchen levothyroxine (SYNTHROID) 125 MCG tablet TAKE ONE TABLET AT BEDTIME 90 tablet 0  . lisinopril (ZESTRIL) 5 MG tablet Take 1 tablet (5 mg total) by mouth daily. 90 tablet 3  . loratadine (CLARITIN) 10 MG  tablet Take 10 mg by mouth daily.    . melatonin 5 MG TABS Take 5 mg by mouth at bedtime.    . pantoprazole (PROTONIX) 40 MG tablet TAKE ONE TABLET DAILY 90 tablet 0  . propranolol (INDERAL) 80 MG tablet TAKE ONE-HALF TABLET 3 TIMES DAILY 135 tablet 1  . sodium chloride 1 g tablet TAKE ONE TABLET THREE TIMES DAILY 180 tablet 0  .  temazepam (RESTORIL) 30 MG capsule Take 1 capsule (30 mg total) by mouth at bedtime. 30 capsule 5  . Tiotropium Bromide-Olodaterol (STIOLTO RESPIMAT) 2.5-2.5 MCG/ACT AERS Inhale 2 puffs into the lungs daily. 12 g 3   No current facility-administered medications for this visit.    Neurologic: Headache: No Seizure: No Paresthesias:No  Musculoskeletal: Strength & Muscle Tone: decreased Gait & Station: unsteady Patient leans: N/A  Psychiatric Specialty Exam: ROS  There were no vitals taken for this visit.There is no height or weight on file to calculate BMI.  General Appearance: Casual  Eye Contact:  Good  Speech:  Clear and Coherent  Volume:  Normal  Mood:  NA  Affect:  depressed  Thought Process:  Goal Directed  Orientation:  Full (Time, Place, and Person)  Thought Content:  WDL  Suicidal Thoughts:  No  Homicidal Thoughts:  No  Memory:  Immediate;   Poor  Judgement:  Hughes  Insight:  Lacking  Psychomotor Activity:  Normal  Concentration:    Recall:  Poor  Fund of Knowledge:Poor  Language: Hughes  Akathisia:  No  Handed:  Right  AIMS (if indicated):   Assets:  Communication Skills  ADL's:  Impaired  Cognition: Impaired,  Moderate  Sleep:      Treatment Plan Summary: This patient's first problem is that of clinical major depression.  She is actually experiencing no residual symptoms.  She takes Wellbutrin 300 mg.  Her second problem is that of insomnia.  Patient takes 30 mg of Restoril without a problem.  She has no falls she has no confusion states.  Her third problem is that of Alzheimer's dementia.  She will continue taking Aricept 10 mg.  This patient is functioning reasonably well and will return to see me in 4 months in the office.

## 2019-07-31 NOTE — Addendum Note (Signed)
Addended by: Desmond Dike C on: 07/31/2019 09:26 AM   Modules accepted: Orders

## 2019-08-02 ENCOUNTER — Other Ambulatory Visit: Payer: Self-pay | Admitting: Family Medicine

## 2019-08-05 ENCOUNTER — Encounter: Payer: Medicare Other | Admitting: Physical Therapy

## 2019-08-07 ENCOUNTER — Encounter: Payer: Medicare Other | Admitting: Physical Therapy

## 2019-08-12 ENCOUNTER — Other Ambulatory Visit: Payer: Self-pay | Admitting: Family Medicine

## 2019-08-30 ENCOUNTER — Telehealth: Payer: Self-pay | Admitting: Emergency Medicine

## 2019-08-30 NOTE — Telephone Encounter (Signed)
All programs are different, some auto ship, some require patient to order. Patient can call-  [place] Phone number: [phone]

## 2019-08-30 NOTE — Telephone Encounter (Signed)
Called and spoke with patient's husband and gave him the number provided by Rachael. [employer] Phone number: [phone]  Nothing further needed at this time

## 2019-08-30 NOTE — Telephone Encounter (Signed)
Ladies please advise on how this works for the patient. Husband called and was asking if they automaticaly got shipped inhalers or how it works.  Patient is on [place].  Was having trouble affording but now has received patient assistance and will get Stiolto through the [facility] cares program .

## 2019-09-24 ENCOUNTER — Other Ambulatory Visit: Payer: Self-pay | Admitting: Family Medicine

## 2019-09-24 NOTE — Telephone Encounter (Signed)
Pt needs appointment for further refills 

## 2019-09-25 ENCOUNTER — Telehealth: Payer: Self-pay | Admitting: Family Medicine

## 2019-09-25 NOTE — Telephone Encounter (Signed)
Returned call to Mr. Donahey and informed him that patient need an ov for further refills. Patient scheduled for 09/30/2019 because patient has about 10 pills left.

## 2019-09-25 NOTE — Telephone Encounter (Signed)
Pts spouse is calling in wanting to know why pts gabapentin 100 MG was denied.  They are wanting a call back.

## 2019-09-30 ENCOUNTER — Ambulatory Visit (INDEPENDENT_AMBULATORY_CARE_PROVIDER_SITE_OTHER): Payer: Medicare Other | Admitting: Family Medicine

## 2019-09-30 ENCOUNTER — Other Ambulatory Visit: Payer: Self-pay

## 2019-09-30 ENCOUNTER — Encounter: Payer: Self-pay | Admitting: Family Medicine

## 2019-09-30 VITALS — BP 122/80 | HR 63 | Temp 98.2°F | Ht 66.0 in | Wt 197.0 lb

## 2019-09-30 DIAGNOSIS — M25561 Pain in right knee: Secondary | ICD-10-CM | POA: Diagnosis not present

## 2019-09-30 DIAGNOSIS — H6121 Impacted cerumen, right ear: Secondary | ICD-10-CM | POA: Diagnosis not present

## 2019-09-30 DIAGNOSIS — R600 Localized edema: Secondary | ICD-10-CM | POA: Diagnosis not present

## 2019-09-30 DIAGNOSIS — M79661 Pain in right lower leg: Secondary | ICD-10-CM | POA: Diagnosis not present

## 2019-09-30 MED ORDER — GABAPENTIN 100 MG PO CAPS: 100.0000 mg | ORAL_CAPSULE | Freq: Two times a day (BID) | ORAL | 3 refills | Status: DC

## 2019-09-30 NOTE — Progress Notes (Signed)
Subjective:    Patient ID: Destiny Hughes, female    DOB: 04-30-1945, 74 y.o.   MRN: [id number]  Chief Complaint  Patient presents with  . Medication Management  Patient accompanied by her husband.  HPI Pt is a 74 yo female with pmh sig for a flutter, HTN, diastolic CHF, COPD, IBS, OA, dementia, HLD, chronic back pain, s/p TKR who was seen for f/u and med management.  Pt endorses right knee pain and edema.  Followed by Ortho but was advised no fluid present to drain.  Pt has a history of knee surgery x2.  Pt also endorses right calf pain and edema.  Pt has SOB at baseline 2/2 COPD.  Per husband, pt with an increase in SOB.  Pt notes weight gain as her husband was in and out of the hospital.  Also having hot flashes and feels like R ear is "stopped up".  Pt requesting refill on Gabapentin.  States request was denied per pharmacy?  Pt brings an updated med list.  Past Medical History:  Diagnosis Date  . Arthritis    OA- knees & back  neck  . Chronic back pain   . Cognitive retention disorder   . Collapse of right lung   . COPD (chronic obstructive pulmonary disease) (HCC)    Hx. Bronchitis occ, 01-25-11 somes issue now taking  Z-pak-started 01-24-11/Scar tissue  present  from previous lung collapse  . Dementia with psychosis ([place])   . Depression   . Depression   . Dyspnea   . Dysrhythmia 2018   treated with Ablation   . GERD (gastroesophageal reflux disease)    tx. TUMS  . Glaucoma   . Headache   . History of kidney stones    surgically treated  . Hyperlipemia   . Hypertension   . Hypothyroidism    tx. Levothyroxine  . Major depression with psychotic features ([place])   . Nephrolithiasis   . Neuromuscular disorder ([place]) 01-25-11   Pain/nerve stimulator implanted -2 yrs ago.  Marland Kitchen Reflex, gag, absent   . Reflex, gag, absent     Allergies  Allergen Reactions  . Albuterol Shortness Of Breath and Other (See Comments)  . Penicillins Anaphylaxis, Swelling and Other (See Comments)     Has patient had a PCN reaction causing immediate rash, facial/tongue/throat swelling, SOB or lightheadedness with hypotension: Yes Has patient had a PCN reaction causing severe rash involving mucus membranes or skin necrosis: Rash Has patient had a PCN reaction that required hospitalization: No Has patient had a PCN reaction occurring within the last 10 years: No If all of the above answers are "NO", then may proceed with Cephalosporin use.  . Sulfa Antibiotics Swelling and Other (See Comments)    Throat swells, but no shortness of breath noted  . Codeine Nausea And Vomiting  . Ecotrin [Aspirin] Nausea And Vomiting  . Zanaflex [Tizanidine] Other (See Comments)    Possible confusion    ROS General: Denies fever, chills, night sweats, changes in weight, changes in appetite HEENT: Denies headaches, ear pain, changes in vision, rhinorrhea, sore throat  +R Ear stopped up. CV: Denies CP, palpitations, SOB, orthopnea  +R calf pain Pulm: Denies SOB, cough, wheezing GI: Denies abdominal pain, nausea, vomiting, diarrhea, constipation GU: Denies dysuria, hematuria, frequency, vaginal discharge Msk: Denies muscle cramps, joint pains  +R knee pain Neuro: Denies weakness, numbness, tingling Skin: Denies rashes, bruising Psych: Denies depression, anxiety, hallucinations     Objective:    Blood pressure 122/80,  pulse 63, temperature 98.2 F (36.8 C), temperature source Other (Comment), height 5\' 6"  (1.676 m), weight 197 lb (89.4 kg), SpO2 93 %.  Gen. Pleasant, well-nourished, in no distress, normal affect   HEENT: [place]/AT, face symmetric, no scleral icterus, PERRLA, EOMI, nares patent without drainage left TM normal.  Right canal occluded with cerumen.  Right TM normal after cerumen removal with curette. Lungs: no accessory muscle use, CTAB, no wheezes or rales Cardiovascular: RRR, no m/r/g, no peripheral edema. Musculoskeletal: RLE nonpitting edema.  Positive Homans' sign and TTP of right calf  and right medial thigh.  No deformities, no cyanosis or clubbing, normal tone Neuro:  A&Ox3, CN II-XII intact, ambulating with cane Skin:  Warm, no lesions/ rash   Wt Readings from Last 3 Encounters:  09/30/19 197 lb (89.4 kg)  07/15/19 198 lb (89.8 kg)  07/08/19 200 lb (90.7 kg)    Lab Results  Component Value Date   WBC 6.4 02/21/2019   HGB 14.3 02/21/2019   HCT 42.5 02/21/2019   PLT 350.0 02/21/2019   GLUCOSE 125 (H) 02/21/2019   CHOL 297 (H) 02/21/2019   TRIG 264.0 (H) 02/21/2019   HDL 59.90 02/21/2019   LDLDIRECT 168.0 02/21/2019   LDLCALC 114 04/28/2015   ALT 8 01/03/2018   AST 11 01/03/2018   NA 137 02/21/2019   K 4.6 02/21/2019   CL 97 02/21/2019   CREATININE 1.13 02/21/2019   BUN 12 02/21/2019   CO2 31 02/21/2019   TSH 2.300 12/17/2018   INR 1.07 08/16/2016   HGBA1C 7.3 (H) 02/21/2019   MICROALBUR 0.4 05/22/2014    Assessment/Plan:  Edema of right lower leg  -discussed possible causes including DVT, h/o right TKR, CHF exacerbation -Given concern for DVT will obtain imaging and D-dimer. -Patient and has been given precautions  Plan: VAS [place] LOWER EXTREMITY VENOUS (DVT), Basic metabolic panel, D-dimer, Quantitative, CBC with Differential/Platelet, Brain Natriuretic Peptide, Brain Natriuretic Peptide, CBC with Differential/Platelet, D-dimer, Quantitative,   Right calf pain  - Plan: VAS [place] LOWER EXTREMITY VENOUS (DVT), D-dimer, Quantitative, D-dimer, Quantitative  Acute pain of right knee -For continued symptoms follow-up with Ortho -Continue Tylenol as needed and topical analgesics. -Also discussed other supportive care including ROM exercises, heat  - Plan: CBC with Differential/Platelet, CBC with Differential/Platelet  Impacted cerumen of right ear -consent obtained.  Cerumen removed from R ear with curette.  Pt tolerated procedure well.  Gabapentin refill  F/u prn  Grier Mitts, MD

## 2019-09-30 NOTE — Patient Instructions (Addendum)
Chronic Knee Pain, Adult Knee pain that lasts longer than 3 months is called chronic knee pain. You may have pain in one or both knees. Symptoms of chronic knee pain may also include swelling and stiffness. The most common cause is age-related wear and tear (osteoarthritis) of your knee joint. Many conditions can cause chronic knee pain. Treatment depends on the cause. The main treatments are physical therapy and weight loss. It may also be treated with medicines, injections, a knee sleeve or brace, and by using crutches. Rest, ice, compression (pressure), and elevation (RICE) therapy may also be recommended. Follow these instructions at home: If you have a knee sleeve or brace:   Wear it as told by your doctor. Remove it only as told by your doctor.  Loosen it if your toes: ? Tingle. ? Become numb. ? Turn cold and blue.  Keep it clean.  If the sleeve or brace is not waterproof: ? Do not let it get wet. ? Remove it if told by your doctor, or cover it with a watertight covering when you take a bath or shower. Managing pain, stiffness, and swelling      If told, put heat on your knee. Do this as often as told by your doctor. Use the heat source that your doctor recommends, such as a moist heat pack or a heating pad. ? If you have a removable sleeve or brace, remove it as told by your doctor. ? Place a towel between your skin and the heat source. ? Leave the heat on for 20-30 minutes. ? Remove the heat if your skin turns bright red. This is very important if you are unable to feel pain, heat, or cold. You may have a greater risk of getting burned.  If told, put ice on your knee. ? If you have a removable sleeve or brace, remove it as told by your doctor. ? Put ice in a plastic bag. ? Place a towel between your skin and the bag. ? Leave the ice on for 20 minutes, 2-3 times a day.  Move your toes often.  Raise (elevate) the injured area above the level of your heart while you are  sitting or lying down. Activity  Avoid activities where both feet leave the ground at the same time (high-impact activities). Examples are running, jumping rope, and doing jumping jacks.  Return to your normal activities as told by your doctor. Ask your doctor what activities are safe for you.  Follow the exercise plan that your doctor makes for you. Your doctor may suggest that you: ? Avoid activities that make knee pain worse. You may need to change the exercises that you do, the sports that you participate in, or your job duties. ? Wear shoes with cushioned soles. ? Avoid high-impact activities or sports that require running and sudden changes in direction. ? Do exercises or physical therapy as told by your doctor. Physical therapy is planned to match your needs and abilities. ? Do exercises that increase your balance and strength, such as tai chi and yoga.  Do not use your injured knee to support your body weight until your doctor says that you can. Use crutches, a cane, or a walker, as told by your doctor. General instructions  Take over-the-counter and prescription medicines only as told by your doctor.  If you are overweight, work with your doctor and a food expert (dietitian) to set goals to lose weight. Being overweight can make your knee hurt more.  Do   not use any products that contain nicotine or tobacco, such as cigarettes, e-cigarettes, and chewing tobacco. If you need help quitting, ask your doctor.  Keep all follow-up visits as told by your doctor. This is important. Contact a doctor if:  You have knee pain that is not getting better or gets worse.  You are not able to do your exercises due to knee pain. Get help right away if:  Your knee swells and the swelling becomes worse.  You cannot move your knee.  You have very bad knee pain. Summary  Knee pain that lasts more than 3 months is considered chronic knee pain.  The main treatments for chronic knee pain are  physical therapy and weight loss. You may also need to take medicines, wear a knee sleeve or brace, use crutches, and put ice or heat on your knee.  Lose weight if you are overweight. Work with your doctor and a food expert (dietitian) to help you set goals to lose weight. Being overweight can make your knee hurt more.  Work with a physical therapist to make a safe exercise program, as told by your doctor. This information is not intended to replace advice given to you by your health care provider. Make sure you discuss any questions you have with your health care provider. Document Revised: 05/17/2018 Document Reviewed: 05/17/2018 Elsevier Patient Education  [place].  Peripheral Edema  Peripheral edema is swelling that is caused by a buildup of fluid. Peripheral edema most often affects the lower legs, ankles, and feet. It can also develop in the arms, hands, and face. The area of the body that has peripheral edema will look swollen. It may also feel heavy or warm. Your clothes may start to feel tight. Pressing on the area may make a temporary dent in your skin. You may not be able to move your swollen arm or leg as much as usual. There are many causes of peripheral edema. It can happen because of a complication of other conditions such as congestive heart failure, kidney disease, or a problem with your blood circulation. It also can be a side effect of certain medicines or because of an infection. It often happens to women during pregnancy. Sometimes, the cause is not known. Follow these instructions at home: Managing pain, stiffness, and swelling   Raise (elevate) your legs while you are sitting or lying down.  Move around often to prevent stiffness and to lessen swelling.  Do not sit or stand for long periods of time.  Wear support stockings as told by your health care provider. Medicines  Take over-the-counter and prescription medicines only as told by your health care  provider.  Your health care provider may prescribe medicine to help your body get rid of excess water (diuretic). General instructions  Pay attention to any changes in your symptoms.  Follow instructions from your health care provider about limiting salt (sodium) in your diet. Sometimes, eating less salt may reduce swelling.  Moisturize skin daily to help prevent skin from cracking and draining.  Keep all follow-up visits as told by your health care provider. This is important. Contact a health care provider if you have:  A fever.  Edema that starts suddenly or is getting worse, especially if you are pregnant or have a medical condition.  Swelling in only one leg.  Increased swelling, redness, or pain in one or both of your legs.  Drainage or sores at the area where you have edema. Get help  right away if you:  Develop shortness of breath, especially when you are lying down.  Have pain in your chest or abdomen.  Feel weak.  Feel faint. Summary  Peripheral edema is swelling that is caused by a buildup of fluid. Peripheral edema most often affects the lower legs, ankles, and feet.  Move around often to prevent stiffness and to lessen swelling. Do not sit or stand for long periods of time.  Pay attention to any changes in your symptoms.  Contact a health care provider if you have edema that starts suddenly or is getting worse, especially if you are pregnant or have a medical condition.  Get help right away if you develop shortness of breath, especially when lying down. This information is not intended to replace advice given to you by your health care provider. Make sure you discuss any questions you have with your health care provider. Document Revised: 11/29/2017 Document Reviewed: 11/29/2017 Elsevier Patient Education  [place].

## 2019-10-01 ENCOUNTER — Ambulatory Visit (HOSPITAL_COMMUNITY)
Admission: RE | Admit: 2019-10-01 | Discharge: 2019-10-01 | Disposition: A | Discharge status: Home / Self Care | Payer: Medicare Other | Source: Ambulatory Visit | Attending: Family Medicine | Admitting: Family Medicine

## 2019-10-01 ENCOUNTER — Telehealth: Payer: Self-pay | Admitting: *Deleted

## 2019-10-01 DIAGNOSIS — M79661 Pain in right lower leg: Secondary | ICD-10-CM | POA: Insufficient documentation

## 2019-10-01 DIAGNOSIS — R600 Localized edema: Secondary | ICD-10-CM

## 2019-10-01 LAB — BASIC METABOLIC PANEL
BUN/Creatinine Ratio: 12 (calc) (ref 6–22)
BUN: 15 mg/dL (ref 7–25)
CO2: 28 mmol/L (ref 20–32)
Calcium: 9.3 mg/dL (ref 8.6–10.4)
Chloride: 95 mmol/L — ABNORMAL LOW (ref 98–110)
Creat: 1.24 mg/dL — ABNORMAL HIGH (ref 0.60–0.93)
Glucose, Bld: 140 mg/dL — ABNORMAL HIGH (ref 65–99)
Potassium: 4.8 mmol/L (ref 3.5–5.3)
Sodium: 131 mmol/L — ABNORMAL LOW (ref 135–146)

## 2019-10-01 LAB — CBC WITH DIFFERENTIAL/PLATELET
Absolute Monocytes: 470 cells/uL (ref 200–950)
Basophils Absolute: 22 cells/uL (ref 0–200)
Basophils Relative: 0.4 %
Eosinophils Absolute: 118 cells/uL (ref 15–500)
Eosinophils Relative: 2.1 %
HCT: 41.3 % (ref 35.0–45.0)
Hemoglobin: 13.3 g/dL (ref 11.7–15.5)
Lymphs Abs: 1770 cells/uL (ref 850–3900)
MCH: 28.5 pg (ref 27.0–33.0)
MCHC: 32.2 g/dL (ref 32.0–36.0)
MCV: 88.6 fL (ref 80.0–100.0)
MPV: 9 fL (ref 7.5–12.5)
Monocytes Relative: 8.4 %
Neutro Abs: 3220 cells/uL (ref 1500–7800)
Neutrophils Relative %: 57.5 %
Platelets: 369 10*3/uL (ref 140–400)
RBC: 4.66 10*6/uL (ref 3.80–5.10)
RDW: 14.6 % (ref 11.0–15.0)
Total Lymphocyte: 31.6 %
WBC: 5.6 10*3/uL (ref 3.8–10.8)

## 2019-10-01 LAB — D-DIMER, QUANTITATIVE: D-Dimer, Quant: 0.76 mcg/mL FEU — ABNORMAL HIGH (ref <0.50)

## 2019-10-01 LAB — BRAIN NATRIURETIC PEPTIDE: Brain Natriuretic Peptide: 57 pg/mL (ref <100)

## 2019-10-01 NOTE — Addendum Note (Signed)
Addended by: Gwenyth Ober R on: 10/01/2019 08:23 AM   Modules accepted: Orders

## 2019-10-01 NOTE — Telephone Encounter (Signed)
Rodman Key from Cardiology on [facility] called to give preliminary report of LE Venous before he let patient leave, negative for DVT.

## 2019-10-02 ENCOUNTER — Other Ambulatory Visit: Payer: Self-pay | Admitting: Family Medicine

## 2019-10-02 DIAGNOSIS — R7989 Other specified abnormal findings of blood chemistry: Secondary | ICD-10-CM

## 2019-10-02 DIAGNOSIS — E871 Hypo-osmolality and hyponatremia: Secondary | ICD-10-CM

## 2019-10-04 ENCOUNTER — Other Ambulatory Visit: Payer: Medicare Other

## 2019-10-04 ENCOUNTER — Other Ambulatory Visit: Payer: Self-pay

## 2019-10-04 ENCOUNTER — Telehealth: Payer: Self-pay | Admitting: Family Medicine

## 2019-10-04 DIAGNOSIS — E871 Hypo-osmolality and hyponatremia: Secondary | ICD-10-CM | POA: Diagnosis not present

## 2019-10-04 NOTE — Telephone Encounter (Signed)
Called pt x2 to schedule CT scan and to make pt aware of walk-in process at [place]. No answer/ No voicemail.

## 2019-10-05 ENCOUNTER — Other Ambulatory Visit: Payer: Self-pay | Admitting: Family Medicine

## 2019-10-05 LAB — BASIC METABOLIC PANEL WITH GFR
BUN/Creatinine Ratio: 14 (calc) (ref 6–22)
BUN: 15 mg/dL (ref 7–25)
CO2: 29 mmol/L (ref 20–32)
Calcium: 9.5 mg/dL (ref 8.6–10.4)
Chloride: 93 mmol/L — ABNORMAL LOW (ref 98–110)
Creat: 1.11 mg/dL — ABNORMAL HIGH (ref 0.60–0.93)
GFR, Est African American: 56 mL/min/{1.73_m2} — ABNORMAL LOW (ref >=60)
GFR, Est Non African American: 48 mL/min/{1.73_m2} — ABNORMAL LOW (ref >=60)
Glucose, Bld: 169 mg/dL — ABNORMAL HIGH (ref 65–99)
Potassium: 4.7 mmol/L (ref 3.5–5.3)
Sodium: 131 mmol/L — ABNORMAL LOW (ref 135–146)

## 2019-10-15 ENCOUNTER — Other Ambulatory Visit: Payer: Self-pay | Admitting: Family Medicine

## 2019-11-11 ENCOUNTER — Other Ambulatory Visit: Payer: Self-pay | Admitting: Family Medicine

## 2019-11-26 ENCOUNTER — Other Ambulatory Visit: Payer: Self-pay | Admitting: Family Medicine

## 2019-12-02 ENCOUNTER — Other Ambulatory Visit: Payer: Self-pay | Admitting: Family Medicine

## 2019-12-11 ENCOUNTER — Ambulatory Visit (HOSPITAL_COMMUNITY): Payer: Medicare Other | Admitting: Psychiatry

## 2019-12-19 ENCOUNTER — Telehealth: Payer: Self-pay | Admitting: Family Medicine

## 2019-12-19 NOTE — Telephone Encounter (Signed)
The patients husband called to ask about a flu shot for the patient.  Please advise

## 2019-12-20 ENCOUNTER — Telehealth (HOSPITAL_COMMUNITY): Payer: Medicare Other | Admitting: Psychiatry

## 2019-12-20 NOTE — Telephone Encounter (Signed)
Patients husband called again wanting to talk with Dr. Volanda Napoleon nurse.  Please advise

## 2019-12-23 ENCOUNTER — Ambulatory Visit (INDEPENDENT_AMBULATORY_CARE_PROVIDER_SITE_OTHER): Payer: Medicare Other

## 2019-12-23 ENCOUNTER — Other Ambulatory Visit: Payer: Self-pay

## 2019-12-23 DIAGNOSIS — Z23 Encounter for immunization: Secondary | ICD-10-CM | POA: Diagnosis not present

## 2019-12-24 ENCOUNTER — Telehealth: Payer: Medicare Other | Admitting: Family Medicine

## 2019-12-24 NOTE — Telephone Encounter (Signed)
Pt had a flu vaccine yesterday 12/24/2019

## 2020-01-06 ENCOUNTER — Other Ambulatory Visit: Payer: Self-pay | Admitting: Family Medicine

## 2020-01-10 ENCOUNTER — Other Ambulatory Visit: Payer: Self-pay | Admitting: Internal Medicine

## 2020-01-16 DIAGNOSIS — M25561 Pain in right knee: Secondary | ICD-10-CM | POA: Diagnosis not present

## 2020-01-16 DIAGNOSIS — M21162 Varus deformity, not elsewhere classified, left knee: Secondary | ICD-10-CM | POA: Diagnosis not present

## 2020-01-16 DIAGNOSIS — M25462 Effusion, left knee: Secondary | ICD-10-CM | POA: Diagnosis not present

## 2020-01-16 DIAGNOSIS — M25562 Pain in left knee: Secondary | ICD-10-CM | POA: Diagnosis not present

## 2020-01-16 DIAGNOSIS — M25362 Other instability, left knee: Secondary | ICD-10-CM | POA: Diagnosis not present

## 2020-01-16 DIAGNOSIS — M170 Bilateral primary osteoarthritis of knee: Secondary | ICD-10-CM | POA: Diagnosis not present

## 2020-01-16 DIAGNOSIS — M1712 Unilateral primary osteoarthritis, left knee: Secondary | ICD-10-CM | POA: Diagnosis not present

## 2020-01-18 ENCOUNTER — Ambulatory Visit: Payer: Medicare Other | Attending: Internal Medicine

## 2020-01-18 DIAGNOSIS — Z23 Encounter for immunization: Secondary | ICD-10-CM

## 2020-01-18 NOTE — Progress Notes (Signed)
   Covid-19 Vaccination Clinic  Name:  Destiny Hughes    MRN: [id number] DOB: 06/29/1945  01/18/2020  [name] was observed post Covid-19 immunization for 15 minutes without incident. She was provided with Vaccine Information Sheet and instruction to access the V-Safe system.   [name] was instructed to call 911 with any severe reactions post vaccine: Marland Kitchen Difficulty breathing  . Swelling of face and throat  . A fast heartbeat  . A bad rash all over body  . Dizziness and weakness

## 2020-01-24 ENCOUNTER — Other Ambulatory Visit: Payer: Self-pay

## 2020-01-24 ENCOUNTER — Telehealth (INDEPENDENT_AMBULATORY_CARE_PROVIDER_SITE_OTHER): Payer: Medicare Other | Admitting: Psychiatry

## 2020-01-24 DIAGNOSIS — F325 Major depressive disorder, single episode, in full remission: Secondary | ICD-10-CM | POA: Diagnosis not present

## 2020-01-24 MED ORDER — BUPROPION HCL ER (XL) 150 MG PO TB24: 300.0000 mg | ORAL_TABLET | ORAL | 6 refills | Status: DC

## 2020-01-24 MED ORDER — TEMAZEPAM 30 MG PO CAPS
30.0000 mg | ORAL_CAPSULE | Freq: Every day | ORAL | 5 refills | Status: DC
Start: 2020-01-24 — End: 2020-05-19

## 2020-01-24 MED ORDER — DONEPEZIL HCL 10 MG PO TABS
10.0000 mg | ORAL_TABLET | Freq: Every day | ORAL | 5 refills | Status: DC
Start: 2020-01-24 — End: 2020-05-19

## 2020-01-24 NOTE — Progress Notes (Signed)
Psychiatric Initial Adult Assessment   Patient Identification: Destiny Hughes MRN:  [id number] Date of Evaluation:  01/24/2020 Referral Source: [facility] Chief Complaint: Major depression remission  Today the patient is interviewed with her husband [name].  At this time the patient is at her baseline.  She denied having any psychotic symptoms.  She is sleeping and eating well.  Her only complaint is short-term memory loss.  She watches TV and she stays active.  She drinks no alcohol uses no drugs.  She is functioning reasonably well.  She denies chest pain shortness of breath or any neurological symptoms at this time.  (Hypo) Manic Symptoms:   Anxiety Symptoms:   Psychotic Symptoms:   PTSD Symptoms:   Past Psychiatric History: none  Previous Psychotropic Medications: Yes   Substance Abuse History in the last 12 months:  No.  Consequences of Substance Abuse:   Past Medical History:  Past Medical History:  Diagnosis Date  . Arthritis    OA- knees & back  neck  . Chronic back pain   . Cognitive retention disorder   . Collapse of right lung   . COPD (chronic obstructive pulmonary disease) (HCC)    Hx. Bronchitis occ, 01-25-11 somes issue now taking  Z-pak-started 01-24-11/Scar tissue  present  from previous lung collapse  . Dementia with psychosis ([place])   . Depression   . Depression   . Dyspnea   . Dysrhythmia 2018   treated with Ablation   . GERD (gastroesophageal reflux disease)    tx. TUMS  . Glaucoma   . Headache   . History of kidney stones    surgically treated  . Hyperlipemia   . Hypertension   . Hypothyroidism    tx. Levothyroxine  . Major depression with psychotic features ([place])   . Nephrolithiasis   . Neuromuscular disorder ([place]) 01-25-11   Pain/nerve stimulator implanted -2 yrs ago.  Marland Kitchen Reflex, gag, absent   . Reflex, gag, absent     Past Surgical History:  Procedure Laterality Date  . A-FLUTTER ABLATION N/A 08/16/2016   Procedure:  A-Flutter Ablation;  Surgeon: Evans Lance, MD;  Location: [facility];  Service: Cardiovascular;  Laterality: N/A;  . ANTERIOR CERVICAL DECOMP/DISCECTOMY FUSION     3 levels, per family  . APPENDECTOMY  01-25-11  . CARPAL TUNNEL RELEASE  01-25-11   Bil.  . CERVICAL SPINE SURGERY  01-25-11   2005-multiple levels  . CHOLECYSTECTOMY  01-25-11   '07  . COLONOSCOPY WITH PROPOFOL N/A 07/23/2012   Procedure: COLONOSCOPY WITH PROPOFOL;  Surgeon: Garlan Fair, MD;  Location: [facility];  Service: Endoscopy;  Laterality: N/A;  . detatched retna Bilateral    done July & August- 2019  . EYE SURGERY Bilateral 2019   repair of retina detachment- Dr. Zadie Rhine at surgical center   . JOINT REPLACEMENT  01-25-11   6'03 LTHA-hemi  . KNEE ARTHROPLASTY  01-25-11   '04-right, revised x2  . no gag reflex     Pt does not havea gag reflex following  siurgery to neck. Family unsure of date. Pt takes pill in apple sauce.  . SPINAL CORD STIMULATOR BATTERY EXCHANGE N/A 01/12/2018   Procedure: Spinal cord stimulator battery replacement;  Surgeon: Clydell Hakim, MD;  Location: [facility];  Service: Neurosurgery;  Laterality: N/A;  left  . SPINAL CORD STIMULATOR IMPLANT  2009 APPROX   DR. ELSNER  . SPINAL CORD STIMULATOR REMOVAL N/A 06/04/2018   Procedure: Removal of spinal  cord stimulator, thoracic paddle and generator;  Surgeon: Kristeen Miss, MD;  Location: [facility];  Service: Neurosurgery;  Laterality: N/A;  . THORACIC SPINE SURGERY  01-25-11   6'02- then nerve stimulator implanted after  . TOTAL KNEE REVISION  02/01/2011   Procedure: TOTAL KNEE REVISION;  Surgeon: Mauri Pole;  Location: [facility];  Service: Orthopedics;  Laterality: Right;  . TUBAL LIGATION      Family Psychiatric History:   Family History:  Family History  Problem Relation Age of Onset  . Heart attack Father   . Emphysema Father   . Aneurysm Mother        CEREBRAL  . Emphysema Mother   . Dementia Mother   . Diabetes Son   .  Hypertension Son   . Hypertension Son   . Cancer Sister        Widely Metastatic  . Asthma Son        x2  . Breast cancer Paternal Aunt   . Rheum arthritis Maternal Aunt     Social History:   Social History   Socioeconomic History  . Marital status: Married    Spouse name: Not on file  . Number of children: 2  . Years of education: Not on file  . Highest education level: Not on file  Occupational History  . Occupation: disabled  Tobacco Use  . Smoking status: Former Smoker    Packs/day: 0.75    Years: 50.00    Pack years: 37.50    Types: Cigarettes    Start date: 09/13/1961    Quit date: 05/2016    Years since quitting: 3.6  . Smokeless tobacco: Never Used  . Tobacco comment: Decreased Down to 2/3 a Day if Thinking [place]ouit It  Vaping Use  . Vaping Use: Never used  Substance and Sexual Activity  . Alcohol use: No    Alcohol/week: 0.0 standard drinks    Comment: none in 10 years  . Drug use: No  . Sexual activity: Never  Other Topics Concern  . Not on file  Social History Narrative   She is from [facility]. She has always lived in [place]. She has traveled to [place], [place], [place], [place], & [place]. 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 Prior exposure to parakeets with last exposure remote in her current home. Previously had mold in her home that was in her bathroom & fixed. Prior exposure to hot tubs but none recently. Pt lives at home with [name], husband.  Has 2 childrean, 12th grade education.     Social Determinants of Health   Financial Resource Strain:   . Difficulty of Paying Living Expenses: Not on file  Food Insecurity:   . Worried About Charity fundraiser in the Last Year: Not on file  . Ran Out of Food in the Last Year: Not on file  Transportation Needs:   . Lack of Transportation (Medical): Not on file  . Lack of Transportation (Non-Medical): Not on file   Physical Activity:   . Days of Exercise per Week: Not on file  . Minutes of Exercise per Session: Not on file  Stress:   . Feeling of Stress : Not on file  Social Connections:   . Frequency of Communication with Friends and Family: Not on file  . Frequency of Social Gatherings with Friends and Family: Not on file  .  Attends Religious Services: Not on file  . Active Member of Clubs or Organizations: Not on file  . Attends Archivist Meetings: Not on file  . Marital Status: Not on file    Additional Social History:   Allergies:   Allergies  Allergen Reactions  . Albuterol Shortness Of Breath and Other (See Comments)  . Penicillins Anaphylaxis, Swelling and Other (See Comments)    Has patient had a PCN reaction causing immediate rash, facial/tongue/throat swelling, SOB or lightheadedness with hypotension: Yes Has patient had a PCN reaction causing severe rash involving mucus membranes or skin necrosis: Rash Has patient had a PCN reaction that required hospitalization: No Has patient had a PCN reaction occurring within the last 10 years: No If all of the above answers are "NO", then may proceed with Cephalosporin use.  . Sulfa Antibiotics Swelling and Other (See Comments)    Throat swells, but no shortness of breath noted  . Codeine Nausea And Vomiting  . Ecotrin [Aspirin] Nausea And Vomiting  . Zanaflex [Tizanidine] Other (See Comments)    Possible confusion    Metabolic Disorder Labs: Lab Results  Component Value Date   HGBA1C 7.3 (H) 02/21/2019   MPG 137 (H) 04/28/2015   MPG 134 (H) 05/21/2014   No results found for: PROLACTIN Lab Results  Component Value Date   CHOL 297 (H) 02/21/2019   TRIG 264.0 (H) 02/21/2019   HDL 59.90 02/21/2019   CHOLHDL 5 02/21/2019   VLDL 52.8 (H) 02/21/2019   LDLCALC 114 04/28/2015   LDLCALC 114 04/28/2015     Current Medications: Current Outpatient Medications  Medication Sig Dispense Refill  . amLODipine (NORVASC) 5 MG  tablet TAKE ONE TABLET DAILY 90 tablet 1  . buPROPion (WELLBUTRIN XL) 150 MG 24 hr tablet Take 2 tablets (300 mg total) by mouth every morning. 60 tablet 6  . diphenhydramine-acetaminophen (TYLENOL PM) 25-500 MG TABS tablet Take 1 tablet by mouth at bedtime as needed.    . donepezil (ARICEPT) 10 MG tablet Take 1 tablet (10 mg total) by mouth at bedtime. 30 tablet 5  . fluticasone (FLONASE) 50 MCG/ACT nasal spray 1 OR 2 SPRAYS IN NOSE ONCE A DAY 16 g 5  . gabapentin (NEURONTIN) 100 MG capsule Take 1 capsule (100 mg total) by mouth 2 (two) times daily. 180 capsule 3  . ipratropium (ATROVENT) 0.03 % nasal spray Place 2 sprays into both nostrils every 12 (twelve) hours.    Marland Kitchen levothyroxine (SYNTHROID) 125 MCG tablet TAKE ONE TABLET AT BEDTIME 90 tablet 0  . lisinopril (ZESTRIL) 5 MG tablet TAKE ONE TABLET DAILY 30 tablet 0  . loratadine (CLARITIN) 10 MG tablet Take 10 mg by mouth daily.    . melatonin 5 MG TABS Take 5 mg by mouth at bedtime.    . pantoprazole (PROTONIX) 40 MG tablet TAKE ONE TABLET DAILY 90 tablet 0  . propranolol (INDERAL) 80 MG tablet TAKE ONE-HALF TABLET 3 TIMES DAILY 135 tablet 1  . sodium chloride 1 g tablet TAKE ONE TABLET THREE TIMES DAILY 180 tablet 0  . temazepam (RESTORIL) 30 MG capsule Take 1 capsule (30 mg total) by mouth at bedtime. 30 capsule 5  . Tiotropium Bromide-Olodaterol (STIOLTO RESPIMAT) 2.5-2.5 MCG/ACT AERS Inhale 2 puffs into the lungs daily. 12 g 3   No current facility-administered medications for this visit.    Neurologic: Headache: No Seizure: No Paresthesias:No  Musculoskeletal: Strength & Muscle Tone: decreased Gait & Station: unsteady Patient leans: N/A  Psychiatric Specialty  Exam: ROS  There were no vitals taken for this visit.There is no height or weight on file to calculate BMI.  General Appearance: Casual  Eye Contact:  Good  Speech:  Clear and Coherent  Volume:  Normal  Mood:  NA  Affect:  depressed  Thought Process:  Goal  Directed  Orientation:  Full (Time, Place, and Person)  Thought Content:  WDL  Suicidal Thoughts:  No  Homicidal Thoughts:  No  Memory:  Immediate;   Poor  Judgement:  Fair  Insight:  Lacking  Psychomotor Activity:  Normal  Concentration:    Recall:  Poor  Fund of Knowledge:Poor  Language: Fair  Akathisia:  No  Handed:  Right  AIMS (if indicated):   Assets:  Communication Skills  ADL's:  Impaired  Cognition: Impaired,  Moderate  Sleep:      Treatment Plan Summary: At this time the patient has 3 problems.  First is that of Alzheimer's dementia.  She will continue taking Aricept 10 mg.  Her second problem is that of major depression in remission.  She will continue taking Wellbutrin as prescribed.  Her third problem is that of insomnia.  She takes Restoril every night and gets a good night sleep.  Overall the patient is doing very well.  She will return to see me in 5 months we will reevaluate her perhaps do a Mini-Mental status exam.  She continues to function well and does all her basic ADLs.

## 2020-01-25 ENCOUNTER — Other Ambulatory Visit: Payer: Self-pay | Admitting: Family Medicine

## 2020-01-29 DIAGNOSIS — M25561 Pain in right knee: Secondary | ICD-10-CM | POA: Diagnosis not present

## 2020-01-29 DIAGNOSIS — M25562 Pain in left knee: Secondary | ICD-10-CM | POA: Diagnosis not present

## 2020-01-29 DIAGNOSIS — M1712 Unilateral primary osteoarthritis, left knee: Secondary | ICD-10-CM | POA: Diagnosis not present

## 2020-02-05 DIAGNOSIS — M25562 Pain in left knee: Secondary | ICD-10-CM | POA: Diagnosis not present

## 2020-02-05 DIAGNOSIS — M1712 Unilateral primary osteoarthritis, left knee: Secondary | ICD-10-CM | POA: Diagnosis not present

## 2020-02-05 DIAGNOSIS — M25561 Pain in right knee: Secondary | ICD-10-CM | POA: Diagnosis not present

## 2020-02-07 ENCOUNTER — Other Ambulatory Visit: Payer: Self-pay | Admitting: Family Medicine

## 2020-02-07 ENCOUNTER — Other Ambulatory Visit: Payer: Self-pay | Admitting: Internal Medicine

## 2020-02-12 DIAGNOSIS — M25562 Pain in left knee: Secondary | ICD-10-CM | POA: Diagnosis not present

## 2020-02-12 DIAGNOSIS — M1712 Unilateral primary osteoarthritis, left knee: Secondary | ICD-10-CM | POA: Diagnosis not present

## 2020-02-19 DIAGNOSIS — M25562 Pain in left knee: Secondary | ICD-10-CM | POA: Diagnosis not present

## 2020-02-19 DIAGNOSIS — M1712 Unilateral primary osteoarthritis, left knee: Secondary | ICD-10-CM | POA: Diagnosis not present

## 2020-02-19 DIAGNOSIS — M25561 Pain in right knee: Secondary | ICD-10-CM | POA: Diagnosis not present

## 2020-02-22 ENCOUNTER — Other Ambulatory Visit: Payer: Self-pay | Admitting: Internal Medicine

## 2020-02-24 ENCOUNTER — Other Ambulatory Visit: Payer: Self-pay

## 2020-02-25 ENCOUNTER — Other Ambulatory Visit: Payer: Self-pay | Admitting: Emergency Medicine

## 2020-02-26 DIAGNOSIS — M25562 Pain in left knee: Secondary | ICD-10-CM | POA: Diagnosis not present

## 2020-02-26 DIAGNOSIS — M25561 Pain in right knee: Secondary | ICD-10-CM | POA: Diagnosis not present

## 2020-02-26 DIAGNOSIS — M1712 Unilateral primary osteoarthritis, left knee: Secondary | ICD-10-CM | POA: Diagnosis not present

## 2020-02-26 NOTE — Progress Notes (Signed)
Subjective:   Destiny Hughes is a 74 y.o. female who presents for Medicare Annual (Subsequent) preventive examination.  Review of Systems    N/A  Cardiac Risk Factors include: advanced age (>53men, >23 women);hypertension     Objective:    Today's Vitals   02/27/20 1043  BP: 128/80  Pulse: 64  Temp: 97.7 F (36.5 C)  TempSrc: Oral  SpO2: 94%  Weight: 196 lb 1 oz (88.9 kg)  Height: 5\' 6"  (1.676 m)  PainSc: 8    Body mass index is 31.65 kg/m.  Advanced Directives 02/27/2020 06/04/2018 06/04/2018 04/19/2018 01/12/2018 01/10/2018 01/05/2018  Does Patient Have a Medical Advance Directive? Yes Yes Yes Yes Yes Yes Yes  Type of Paramedic of [place];Living will [place];Living will [place];Living will - [place];Living will [place];Living will [place];Living will  Does patient want to make changes to medical advance directive? No - Patient declined No - Patient declined No - Patient declined - No - Patient declined - -  Copy of [place] in Chart? No - copy requested No - copy requested No - copy requested - No - copy requested - -  Would patient like information on creating a medical advance directive? - - - - - - -  Pre-existing out of facility DNR order (yellow form or pink MOST form) - - - - - - -  Some encounter information is confidential and restricted. Go to Review Flowsheets activity to see all data.    Current Medications (verified) Outpatient Encounter Medications as of 02/27/2020  Medication Sig  . amLODipine (NORVASC) 5 MG tablet TAKE ONE TABLET DAILY  . buPROPion (WELLBUTRIN XL) 150 MG 24 hr tablet Take 2 tablets (300 mg total) by mouth every morning.  . diphenhydramine-acetaminophen (TYLENOL PM) 25-500 MG TABS tablet Take 1 tablet by mouth at bedtime as needed.  . donepezil (ARICEPT) 10 MG tablet Take 1 tablet (10 mg  total) by mouth at bedtime.  . fluticasone (FLONASE) 50 MCG/ACT nasal spray 1 OR 2 SPRAYS IN NOSE ONCE A DAY  . ipratropium (ATROVENT) 0.03 % nasal spray PLACE 2 SPRAYS INTO BOTH NOSTRILS EVERY 12 HOURS  . levothyroxine (SYNTHROID) 125 MCG tablet TAKE ONE TABLET AT BEDTIME  . lisinopril (ZESTRIL) 5 MG tablet TAKE ONE TABLET DAILY  . loratadine (CLARITIN) 10 MG tablet Take 10 mg by mouth daily.  . melatonin 5 MG TABS Take 5 mg by mouth at bedtime.  . pantoprazole (PROTONIX) 40 MG tablet TAKE ONE TABLET DAILY  . propranolol (INDERAL) 80 MG tablet TAKE ONE-HALF TABLET 3 TIMES DAILY  . sodium chloride 1 g tablet TAKE ONE TABLET THREE TIMES DAILY  . temazepam (RESTORIL) 30 MG capsule Take 1 capsule (30 mg total) by mouth at bedtime.  . Tiotropium Bromide-Olodaterol (STIOLTO RESPIMAT) 2.5-2.5 MCG/ACT AERS Inhale 2 puffs into the lungs daily.  Marland Kitchen gabapentin (NEURONTIN) 100 MG capsule Take 1 capsule (100 mg total) by mouth 2 (two) times daily.  . Misc Natural Products (JOINT HEALTH PO) Take by mouth.   No facility-administered encounter medications on file as of 02/27/2020.    Allergies (verified) Albuterol, Penicillins, Sulfa antibiotics, Codeine, Ecotrin [aspirin], and Zanaflex [tizanidine]   History: Past Medical History:  Diagnosis Date  . Arthritis    OA- knees & back  neck  . Chronic back pain   . Cognitive retention disorder   . Collapse of right lung   .  COPD (chronic obstructive pulmonary disease) (HCC)    Hx. Bronchitis occ, 01-25-11 somes issue now taking  Z-pak-started 01-24-11/Scar tissue  present  from previous lung collapse  . Dementia with psychosis ([place])   . Depression   . Depression   . Dyspnea   . Dysrhythmia 2018   treated with Ablation   . GERD (gastroesophageal reflux disease)    tx. TUMS  . Glaucoma   . Headache   . History of kidney stones    surgically treated  . Hyperlipemia   . Hypertension   . Hypothyroidism    tx. Levothyroxine  . Major depression with  psychotic features ([place])   . Nephrolithiasis   . Neuromuscular disorder ([place]) 01-25-11   Pain/nerve stimulator implanted -2 yrs ago.  Marland Kitchen Reflex, gag, absent   . Reflex, gag, absent    Past Surgical History:  Procedure Laterality Date  . A-FLUTTER ABLATION N/A 08/16/2016   Procedure: A-Flutter Ablation;  Surgeon: Evans Lance, MD;  Location: [facility];  Service: Cardiovascular;  Laterality: N/A;  . ANTERIOR CERVICAL DECOMP/DISCECTOMY FUSION     3 levels, per family  . APPENDECTOMY  01-25-11  . CARPAL TUNNEL RELEASE  01-25-11   Bil.  . CERVICAL SPINE SURGERY  01-25-11   2005-multiple levels  . CHOLECYSTECTOMY  01-25-11   '07  . COLONOSCOPY WITH PROPOFOL N/A 07/23/2012   Procedure: COLONOSCOPY WITH PROPOFOL;  Surgeon: Garlan Fair, MD;  Location: [facility];  Service: Endoscopy;  Laterality: N/A;  . detatched retna Bilateral    done July & August- 2019  . EYE SURGERY Bilateral 2019   repair of retina detachment- Dr. Zadie Rhine at surgical center   . JOINT REPLACEMENT  01-25-11   6'03 LTHA-hemi  . KNEE ARTHROPLASTY  01-25-11   '04-right, revised x2  . no gag reflex     Pt does not havea gag reflex following  siurgery to neck. Family unsure of date. Pt takes pill in apple sauce.  . SPINAL CORD STIMULATOR BATTERY EXCHANGE N/A 01/12/2018   Procedure: Spinal cord stimulator battery replacement;  Surgeon: Clydell Hakim, MD;  Location: [facility];  Service: Neurosurgery;  Laterality: N/A;  left  . SPINAL CORD STIMULATOR IMPLANT  2009 APPROX   DR. ELSNER  . SPINAL CORD STIMULATOR REMOVAL N/A 06/04/2018   Procedure: Removal of spinal cord stimulator, thoracic paddle and generator;  Surgeon: Kristeen Miss, MD;  Location: [facility];  Service: Neurosurgery;  Laterality: N/A;  . THORACIC SPINE SURGERY  01-25-11   6'02- then nerve stimulator implanted after  . TOTAL KNEE REVISION  02/01/2011   Procedure: TOTAL KNEE REVISION;  Surgeon: Mauri Pole;  Location: [facility];  Service: Orthopedics;   Laterality: Right;  . TUBAL LIGATION     Family History  Problem Relation Age of Onset  . Heart attack Father   . Emphysema Father   . Aneurysm Mother        CEREBRAL  . Emphysema Mother   . Dementia Mother   . Diabetes Son   . Hypertension Son   . Hypertension Son   . Cancer Sister        Widely Metastatic  . Asthma Son        x2  . Breast cancer Paternal Aunt   . Rheum arthritis Maternal Aunt    Social History   Socioeconomic History  . Marital status: Married    Spouse name: Not on file  . Number of children: 2  . Years of  education: Not on file  . Highest education level: Not on file  Occupational History  . Occupation: disabled  Tobacco Use  . Smoking status: Former Smoker    Packs/day: 0.75    Years: 50.00    Pack years: 37.50    Types: Cigarettes    Start date: 09/13/1961    Quit date: 05/2016    Years since quitting: 3.7  . Smokeless tobacco: Never Used  . Tobacco comment: Decreased Down to 2/3 a Day if Thinking Abouit It  Vaping Use  . Vaping Use: Never used  Substance and Sexual Activity  . Alcohol use: No    Alcohol/week: 0.0 standard drinks    Comment: none in 10 years  . Drug use: No  . Sexual activity: Never  Other Topics Concern  . Not on file  Social History Narrative   She is from [place] She has always lived in [place]. She has traveled to [place], [place], [place], [place], & [place]. 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 Prior exposure to parakeets with last exposure remote in her current home. Previously had mold in her home that was in her bathroom & fixed. Prior exposure to hot tubs but none recently. Pt lives at home with [name], husband.  Has 2 childrean, 12th grade education.     Social Determinants of Health   Financial Resource Strain: Low Risk   . Difficulty of Paying Living Expenses: Not hard at all  Food Insecurity: No Food Insecurity   . Worried About Charity fundraiser in the Last Year: Never true  . Ran Out of Food in the Last Year: Never true  Transportation Needs: No Transportation Needs  . Lack of Transportation (Medical): No  . Lack of Transportation (Non-Medical): No  Physical Activity: Inactive  . Days of Exercise per Week: 0 days  . Minutes of Exercise per Session: 0 min  Stress: No Stress Concern Present  . Feeling of Stress : Not at all  Social Connections: Moderately Isolated  . Frequency of Communication with Friends and Family: More than three times a week  . Frequency of Social Gatherings with Friends and Family: Twice a week  . Attends Religious Services: Never  . Active Member of Clubs or Organizations: No  . Attends Archivist Meetings: Never  . Marital Status: Married    Tobacco Counseling Counseling given: Not Answered Comment: Decreased Down to 2/3 a Day if Thinking Abouit It   Clinical Intake:  Pre-visit preparation completed: Yes  Pain : 0-10 Pain Score: 8  Pain Type: Chronic pain Pain Location: Knee Pain Orientation: Left,Right Pain Descriptors / Indicators: Aching Pain Onset: More than a month ago Pain Frequency: Constant Pain Relieving Factors: Asper cream, tylenol  Pain Relieving Factors: Asper cream, tylenol  Nutritional Status: BMI > 30  Obese Nutritional Risks: None Diabetes: Yes CBG done?: No Did pt. bring in CBG monitor from home?: No  How often do you need to have someone help you when you read instructions, pamphlets, or other written materials from your doctor or pharmacy?: 1 - Never What is the last grade level you completed in school?: Some College  Diabetic?Yes Nutrition Risk Assessment:  Has the patient had any N/V/D within the last 2 months?  No  Does the patient have any non-healing wounds?  No  Has the patient had any unintentional weight loss or weight  gain?  No   Diabetes:  Is the patient diabetic?  Yes  If diabetic, was a CBG  obtained today?  No  Did the patient bring in their glucometer from home?  No  How often do you monitor your CBG's? Patient states does not check glucose at home.   Financial Strains and Diabetes Management:  Are you having any financial strains with the device, your supplies or your medication? No .  Does the patient want to be seen by Chronic Care Management for management of their diabetes?  No  Would the patient like to be referred to a Nutritionist or for Diabetic Management?  No   Diabetic Exams:  Diabetic Eye Exam: Overdue for diabetic eye exam. Pt has been advised about the importance in completing this exam. Patient advised to call and schedule an eye exam. Diabetic Foot Exam: Overdue, Pt has been advised about the importance in completing this exam. Pt is scheduled for diabetic foot exam on at next appointment scheduled with Dr. Volanda Napoleon.      Information entered by :: [place] of Daily Living In your present state of health, do you have any difficulty performing the following activities: 02/27/2020  Hearing? N  Vision? N  Difficulty concentrating or making decisions? Y  Comment has some short term memory loss  Walking or climbing stairs? Y  Comment knee pain, and shortness of breath  Dressing or bathing? N  Doing errands, shopping? Y  Preparing Food and eating ? N  Using the Toilet? N  In the past six months, have you accidently leaked urine? N  Do you have problems with loss of bowel control? N  Managing your Medications? N  Managing your Finances? N  Housekeeping or managing your Housekeeping? N  Some recent data might be hidden    Patient Care Team: Billie Ruddy, MD as PCP - General (Family Medicine) Evans Lance, MD as PCP - Cardiology (Cardiology) Paralee Cancel, MD as Consulting Physician (Orthopedic Surgery) Darlin Coco, MD as Consulting Physician (Cardiology) Garlan Fair, MD as Consulting Physician  (Gastroenterology) Nicholaus Bloom, MD (Anesthesiology)  Indicate any recent Medical Services you may have received from other than [facility] providers in the past year (date may be approximate).     Assessment:   This is a routine wellness examination for Destiny Hughes.  Hearing/Vision screen  Hearing Screening   125Hz  250Hz  500Hz  1000Hz  2000Hz  3000Hz  4000Hz  6000Hz  8000Hz   Right ear:           Left ear:           Vision Screening Comments: Patient states usually gets eye exams yearly. Is currently behind on eye exam at this time.  Dietary issues and exercise activities discussed: Current Exercise Habits: The patient does not participate in regular exercise at present  Goals    . Exercise 3x per week (30 min per time)      Depression Screen PHQ 2/9 Scores 02/27/2020 07/26/2016 07/15/2016 04/28/2015 10/03/2014 05/21/2014  PHQ - 2 Score 0 0 0 0 2 0  PHQ- 9 Score 0 - - - 6 -    Fall Risk Fall Risk  02/27/2020 07/26/2016 07/15/2016 04/28/2015 10/03/2014  Falls in the past year? 0 Yes - No Yes  Number falls in past yr: 0 2 or more - - 2 or more  Injury with Fall? 0 Yes - - No  Risk Factor Category  - High Fall Risk - - High Fall Risk  Risk for fall  due to : History of fall(s);Impaired balance/gait History of fall(s);Impaired balance/gait;Impaired mobility;Impaired vision Impaired balance/gait;Impaired mobility;Impaired vision;Medication side effect - History of fall(s);Impaired balance/gait;Impaired mobility  Follow up Falls evaluation completed;Falls prevention discussed Falls prevention discussed - - Falls prevention discussed;Follow up appointment    FALL RISK PREVENTION PERTAINING TO THE HOME:  Any stairs in or around the home? No  If so, are there any without handrails? No  Home free of loose throw rugs in walkways, pet beds, electrical cords, etc? Yes  Adequate lighting in your home to reduce risk of falls? Yes   ASSISTIVE DEVICES UTILIZED TO PREVENT FALLS:  Life alert? No  Use of a cane, walker  or w/c? Yes  Grab bars in the bathroom? No  Shower chair or bench in shower? No  Elevated toilet seat or a handicapped toilet? No   TIMED UP AND GO:  Was the test performed? Yes .  Length of time to ambulate 10 feet: 8 sec.   Gait slow and steady with assistive device  Cognitive Function: MMSE - Mini Mental State Exam 08/22/2016 07/01/2016  Orientation to time 0 1  Orientation to Place 3 3  Registration 3 3  Attention/ Calculation 1 3  Recall 2 1  Language- name 2 objects 2 2  Language- repeat 1 1  Language- follow 3 step command 3 3  Language- read & follow direction 1 1  Write a sentence 1 1  Copy design 1 1  Total score 18 20     6CIT Screen 02/27/2020  What Year? 0 points  What month? 0 points  What time? 0 points  Count back from 20 0 points  Months in reverse 4 points  Repeat phrase 2 points  Total Score 6    Immunizations Immunization History  Administered Date(s) Administered  . Fluad Quad(high Dose 65+) 12/17/2018, 12/23/2019  . Influenza, High Dose Seasonal PF 04/13/2017, 12/05/2017  . PFIZER SARS-COV-2 Vaccination 04/26/2019, 05/21/2019, 01/18/2020  . Pneumococcal Conjugate-13 10/03/2014  . Pneumococcal Polysaccharide-23 10/18/2011  . Td 03/21/2010    TDAP status: Up to date  Flu Vaccine status: Up to date  Pneumococcal vaccine status: Up to date  Covid-19 vaccine status: Completed vaccines  Qualifies for Shingles Vaccine? Yes   Zostavax completed No   Shingrix Completed?: No.    Education has been provided regarding the importance of this vaccine. Patient has been advised to call insurance company to determine out of pocket expense if they have not yet received this vaccine. Advised may also receive vaccine at local pharmacy or Health Dept. Verbalized acceptance and understanding.  Screening Tests Health Maintenance  Topic Date Due  . Hepatitis C Screening  Never done  . DEXA SCAN  Never done  . TETANUS/TDAP  03/21/2020  . COVID-19 Vaccine (4  - Booster for Pfizer series) 07/18/2020  . INFLUENZA VACCINE  Completed  . PNA vac Low Risk Adult  Completed    Health Maintenance  Health Maintenance Due  Topic Date Due  . Hepatitis C Screening  Never done  . DEXA SCAN  Never done    Colorectal cancer screening: Type of screening: Colonoscopy. Completed 07/23/2012. Repeat every 10 years  Mammogram status: No longer required due to age.  Bone Density Screening: Patient declined   Lung Cancer Screening: (Low Dose CT Chest recommended if Age 38-80 years, 30 pack-year currently smoking OR have quit w/in 15years.) does qualify.   Lung Cancer Screening Referral: Yes  Additional Screening:  Hepatitis C Screening: does qualify;  Vision Screening: Recommended annual ophthalmology exams for early detection of glaucoma and other disorders of the eye. Is the patient up to date with their annual eye exam?  No  Who is the provider or what is the name of the office in which the patient attends annual eye exams? Patient can not remember eye doctor. Eye doctor is located on [address]  If pt is not established with a provider, would they like to be referred to a provider to establish care? No .   Dental Screening: Recommended annual dental exams for proper oral hygiene  Community Resource Referral / Chronic Care Management: CRR required this visit?  No   CCM required this visit?  No      Plan:     I have personally reviewed and noted the following in the patient's chart:   . Medical and social history . Use of alcohol, tobacco or illicit drugs  . Current medications and supplements . Functional ability and status . Nutritional status . Physical activity . Advanced directives . List of other physicians . Hospitalizations, surgeries, and ER visits in previous 12 months . Vitals . Screenings to include cognitive, depression, and falls . Referrals and appointments  In addition, I have reviewed and discussed with patient  certain preventive protocols, quality metrics, and best practice recommendations. A written personalized care plan for preventive services as well as general preventive health recommendations were provided to patient.     Ofilia Neas, LPN   18/06/8590   Nurse Notes: None

## 2020-02-27 ENCOUNTER — Other Ambulatory Visit: Payer: Self-pay

## 2020-02-27 ENCOUNTER — Ambulatory Visit (INDEPENDENT_AMBULATORY_CARE_PROVIDER_SITE_OTHER): Payer: Medicare Other

## 2020-02-27 VITALS — BP 128/80 | HR 64 | Temp 97.7°F | Ht 66.0 in | Wt 196.1 lb

## 2020-02-27 DIAGNOSIS — Z87891 Personal history of nicotine dependence: Secondary | ICD-10-CM | POA: Diagnosis not present

## 2020-02-27 DIAGNOSIS — Z0000 Encounter for general adult medical examination without abnormal findings: Secondary | ICD-10-CM | POA: Diagnosis not present

## 2020-02-27 NOTE — Patient Instructions (Signed)
[name] , Thank you for taking time to come for your Medicare Wellness Visit. I appreciate your ongoing commitment to your health goals. Please review the following plan we discussed and let me know if I can assist you in the future.   Screening recommendations/referrals: Colonoscopy: Up to date, next due 07/24/2022 Mammogram: Currently due, patient declined  Bone Density: Currently due, patient declined  Recommended yearly ophthalmology/optometry visit for glaucoma screening and checkup Recommended yearly dental visit for hygiene and checkup  Vaccinations: Influenza vaccine: Up to date, next due fall 2022  Pneumococcal vaccine: Completed series  Tdap vaccine: Currently due, Please contact your insurance company to discuss cost or you may await and injury to receive  Shingles vaccine: Currently due for Shingrix, if you wish to receive we recommend that you do so at your local Pharmacy as it is less expensive     Advanced directives: Please bring in copies of your advanced medical directives into our office so that we may scan them into your chart.   Conditions/risks identified: None   Next appointment: 03/02/2020 @ 8:30 am with Dr. Volanda Hughes     Preventive Care 65 Years and Older, Female Preventive care refers to lifestyle choices and visits with your health care provider that can promote health and wellness. What does preventive care include?  A yearly physical exam. This is also called an annual well check.  Dental exams once or twice a year.  Routine eye exams. Ask your health care provider how often you should have your eyes checked.  Personal lifestyle choices, including:  Daily care of your teeth and gums.  Regular physical activity.  Eating a healthy diet.  Avoiding tobacco and drug use.  Limiting alcohol use.  Practicing safe sex.  Taking low-dose aspirin every day.  Taking vitamin and mineral supplements as recommended by your health care provider. What  happens during an annual well check? The services and screenings done by your health care provider during your annual well check will depend on your age, overall health, lifestyle risk factors, and family history of disease. Counseling  Your health care provider may ask you questions about your:  Alcohol use.  Tobacco use.  Drug use.  Emotional well-being.  Home and relationship well-being.  Sexual activity.  Eating habits.  History of falls.  Memory and ability to understand (cognition).  Work and work Statistician.  Reproductive health. Screening  You may have the following tests or measurements:  Height, weight, and BMI.  Blood pressure.  Lipid and cholesterol levels. These may be checked every 5 years, or more frequently if you are over 78 years old.  Skin check.  Lung cancer screening. You may have this screening every year starting at age 33 if you have a 30-pack-year history of smoking and currently smoke or have quit within the past 15 years.  Fecal occult blood test (FOBT) of the stool. You may have this test every year starting at age 43.  Flexible sigmoidoscopy or colonoscopy. You may have a sigmoidoscopy every 5 years or a colonoscopy every 10 years starting at age 57.  Hepatitis C blood test.  Hepatitis B blood test.  Sexually transmitted disease (STD) testing.  Diabetes screening. This is done by checking your blood sugar (glucose) after you have not eaten for a while (fasting). You may have this done every 1-3 years.  Bone density scan. This is done to screen for osteoporosis. You may have this done starting at age 61.  Mammogram. This may  be done every 1-2 years. Talk to your health care provider about how often you should have regular mammograms. Talk with your health care provider about your test results, treatment options, and if necessary, the need for more tests. Vaccines  Your health care provider may recommend certain vaccines, such  as:  Influenza vaccine. This is recommended every year.  Tetanus, diphtheria, and acellular pertussis (Tdap, Td) vaccine. You may need a Td booster every 10 years.  Zoster vaccine. You may need this after age 26.  Pneumococcal 13-valent conjugate (PCV13) vaccine. One dose is recommended after age 2.  Pneumococcal polysaccharide (PPSV23) vaccine. One dose is recommended after age 60. Talk to your health care provider about which screenings and vaccines you need and how often you need them. This information is not intended to replace advice given to you by your health care provider. Make sure you discuss any questions you have with your health care provider. Document Released: 04/03/2015 Document Revised: 11/25/2015 Document Reviewed: 01/06/2015 Elsevier Interactive Patient Education  2017 [place] Prevention in the Home Falls can cause injuries. They can happen to people of all ages. There are many things you can do to make your home safe and to help prevent falls. What can I do on the outside of my home?  Regularly fix the edges of walkways and driveways and fix any cracks.  Remove anything that might make you trip as you walk through a door, such as a raised step or threshold.  Trim any bushes or trees on the path to your home.  Use bright outdoor lighting.  Clear any walking paths of anything that might make someone trip, such as rocks or tools.  Regularly check to see if handrails are loose or broken. Make sure that both sides of any steps have handrails.  Any raised decks and porches should have guardrails on the edges.  Have any leaves, snow, or ice cleared regularly.  Use sand or salt on walking paths during winter.  Clean up any spills in your garage right away. This includes oil or grease spills. What can I do in the bathroom?  Use night lights.  Install grab bars by the toilet and in the tub and shower. Do not use towel bars as grab bars.  Use  non-skid mats or decals in the tub or shower.  If you need to sit down in the shower, use a plastic, non-slip stool.  Keep the floor dry. Clean up any water that spills on the floor as soon as it happens.  Remove soap buildup in the tub or shower regularly.  Attach bath mats securely with double-sided non-slip rug tape.  Do not have throw rugs and other things on the floor that can make you trip. What can I do in the bedroom?  Use night lights.  Make sure that you have a light by your bed that is easy to reach.  Do not use any sheets or blankets that are too big for your bed. They should not hang down onto the floor.  Have a firm chair that has side arms. You can use this for support while you get dressed.  Do not have throw rugs and other things on the floor that can make you trip. What can I do in the kitchen?  Clean up any spills right away.  Avoid walking on wet floors.  Keep items that you use a lot in easy-to-reach places.  If you need to reach something above you,  use a strong step stool that has a grab bar.  Keep electrical cords out of the way.  Do not use floor polish or wax that makes floors slippery. If you must use wax, use non-skid floor wax.  Do not have throw rugs and other things on the floor that can make you trip. What can I do with my stairs?  Do not leave any items on the stairs.  Make sure that there are handrails on both sides of the stairs and use them. Fix handrails that are broken or loose. Make sure that handrails are as long as the stairways.  Check any carpeting to make sure that it is firmly attached to the stairs. Fix any carpet that is loose or worn.  Avoid having throw rugs at the top or bottom of the stairs. If you do have throw rugs, attach them to the floor with carpet tape.  Make sure that you have a light switch at the top of the stairs and the bottom of the stairs. If you do not have them, ask someone to add them for you. What  else can I do to help prevent falls?  Wear shoes that:  Do not have high heels.  Have rubber bottoms.  Are comfortable and fit you well.  Are closed at the toe. Do not wear sandals.  If you use a stepladder:  Make sure that it is fully opened. Do not climb a closed stepladder.  Make sure that both sides of the stepladder are locked into place.  Ask someone to hold it for you, if possible.  Clearly mark and make sure that you can see:  Any grab bars or handrails.  First and last steps.  Where the edge of each step is.  Use tools that help you move around (mobility aids) if they are needed. These include:  Canes.  Walkers.  Scooters.  Crutches.  Turn on the lights when you go into a dark area. Replace any light bulbs as soon as they burn out.  Set up your furniture so you have a clear path. Avoid moving your furniture around.  If any of your floors are uneven, fix them.  If there are any pets around you, be aware of where they are.  Review your medicines with your doctor. Some medicines can make you feel dizzy. This can increase your chance of falling. Ask your doctor what other things that you can do to help prevent falls. This information is not intended to replace advice given to you by your health care provider. Make sure you discuss any questions you have with your health care provider. Document Released: 01/01/2009 Document Revised: 08/13/2015 Document Reviewed: 04/11/2014 Elsevier Interactive Patient Education  2017 [employer].

## 2020-03-02 ENCOUNTER — Ambulatory Visit: Payer: Medicare Other | Admitting: Family Medicine

## 2020-03-02 ENCOUNTER — Other Ambulatory Visit: Payer: Self-pay

## 2020-03-02 ENCOUNTER — Ambulatory Visit (INDEPENDENT_AMBULATORY_CARE_PROVIDER_SITE_OTHER): Payer: Medicare Other | Admitting: Family Medicine

## 2020-03-02 ENCOUNTER — Encounter: Payer: Self-pay | Admitting: Family Medicine

## 2020-03-02 VITALS — BP 130/80 | HR 70 | Temp 98.6°F | Wt 196.6 lb

## 2020-03-02 DIAGNOSIS — G8929 Other chronic pain: Secondary | ICD-10-CM

## 2020-03-02 DIAGNOSIS — M25561 Pain in right knee: Secondary | ICD-10-CM | POA: Diagnosis not present

## 2020-03-02 DIAGNOSIS — E871 Hypo-osmolality and hyponatremia: Secondary | ICD-10-CM | POA: Diagnosis not present

## 2020-03-02 DIAGNOSIS — J449 Chronic obstructive pulmonary disease, unspecified: Secondary | ICD-10-CM

## 2020-03-02 DIAGNOSIS — E039 Hypothyroidism, unspecified: Secondary | ICD-10-CM

## 2020-03-02 DIAGNOSIS — I10 Essential (primary) hypertension: Secondary | ICD-10-CM

## 2020-03-02 DIAGNOSIS — E119 Type 2 diabetes mellitus without complications: Secondary | ICD-10-CM

## 2020-03-02 DIAGNOSIS — Z1159 Encounter for screening for other viral diseases: Secondary | ICD-10-CM

## 2020-03-02 DIAGNOSIS — M25562 Pain in left knee: Secondary | ICD-10-CM | POA: Diagnosis not present

## 2020-03-02 NOTE — Progress Notes (Signed)
Subjective:    Patient ID: Destiny Hughes, female    DOB: 07/29/1945, 74 y.o.   MRN: [id number]  No chief complaint on file. Patient accompanied by her husband.  HPI Patient is a 74 year old female with past medical history significant for HLD, HTN, hyponatremia, chronic knee pain, h/o depression, chronic cough, chronic pain, IBS, COPD, emphysema, GERD, A-flutter/A. fib, diastolic CHF, MVP who was seen for follow-up.  Patient endorses bilateral knee pain right greater than left with muscle spasms.  Patient seen by flex agenic and received injections in left knee x4 weeks with no improvement.  Patient has a history of R TKR.  Pt tried PT without any improvement in symptoms.  In the past tizanidine contributed to pt's confusion in addition other medications.  Aspercreme helps some.  Voltaren gel did not help.  Taking Tylenol arthritis strength.  Pt requesting muscle relaxer on Darvocet.  States heat makes symptoms worse.  Pt drinks coffee and Pepsi throughout the day.  Only drinks water to take medication.  When hesitation expressed about muscle relaxer by provider and patient's husband, patient becomes frustrated and states she is ready to leave.  Past Medical History:  Diagnosis Date  . Arthritis    OA- knees & back  neck  . Chronic back pain   . Cognitive retention disorder   . Collapse of right lung   . COPD (chronic obstructive pulmonary disease) (HCC)    Hx. Bronchitis occ, 01-25-11 somes issue now taking  Z-pak-started 01-24-11/Scar tissue  present  from previous lung collapse  . Dementia with psychosis ([place])   . Depression   . Depression   . Dyspnea   . Dysrhythmia 2018   treated with Ablation   . GERD (gastroesophageal reflux disease)    tx. TUMS  . Glaucoma   . Headache   . History of kidney stones    surgically treated  . Hyperlipemia   . Hypertension   . Hypothyroidism    tx. Levothyroxine  . Major depression with psychotic features ([place])   . Nephrolithiasis   .  Neuromuscular disorder ([place]) 01-25-11   Pain/nerve stimulator implanted -2 yrs ago.  Marland Kitchen Reflex, gag, absent   . Reflex, gag, absent     Allergies  Allergen Reactions  . Albuterol Shortness Of Breath and Other (See Comments)  . Penicillins Anaphylaxis, Swelling and Other (See Comments)    Has patient had a PCN reaction causing immediate rash, facial/tongue/throat swelling, SOB or lightheadedness with hypotension: Yes Has patient had a PCN reaction causing severe rash involving mucus membranes or skin necrosis: Rash Has patient had a PCN reaction that required hospitalization: No Has patient had a PCN reaction occurring within the last 10 years: No If all of the above answers are "NO", then may proceed with Cephalosporin use.  . Sulfa Antibiotics Swelling and Other (See Comments)    Throat swells, but no shortness of breath noted  . Codeine Nausea And Vomiting  . Ecotrin [Aspirin] Nausea And Vomiting  . Zanaflex [Tizanidine] Other (See Comments)    Possible confusion    ROS General: Denies fever, chills, night sweats, changes in weight, changes in appetite HEENT: Denies headaches, ear pain, changes in vision, rhinorrhea, sore throat CV: Denies CP, palpitations, SOB, orthopnea Pulm: Denies SOB, cough, wheezing GI: Denies abdominal pain, nausea, vomiting, diarrhea, constipation GU: Denies dysuria, hematuria, frequency, vaginal discharge Msk: Denies muscle cramps, joint pains  +b/l knee pain, muscle spasm Neuro: Denies weakness, numbness, tingling Skin: Denies rashes, bruising Psych: Denies  depression, anxiety, hallucinations      Objective:    Blood pressure 130/80, pulse 70, temperature 98.6 F (37 C), temperature source Oral, weight 196 lb 9.6 oz (89.2 kg), SpO2 97 %.  Gen. Pleasant, well-nourished, in no distress, normal affect  HEENT: [place]/AT, wearing glasses, face symmetric, conjunctiva clear, no scleral icterus, PERRLA, EOMI, nares patent without drainage Lungs: no accessory  muscle use Musculoskeletal: Bilateral knees visibly enlarged.  no deformities, no cyanosis or clubbing, normal tone Neuro:  CN II-XII intact, ambulating with cane Skin:  Warm, no lesions/ rash   Wt Readings from Last 3 Encounters:  02/27/20 196 lb 1 oz (88.9 kg)  09/30/19 197 lb (89.4 kg)  07/15/19 198 lb (89.8 kg)    Lab Results  Component Value Date   WBC 5.6 09/30/2019   HGB 13.3 09/30/2019   HCT 41.3 09/30/2019   PLT 369 09/30/2019   GLUCOSE 169 (H) 10/04/2019   CHOL 297 (H) 02/21/2019   TRIG 264.0 (H) 02/21/2019   HDL 59.90 02/21/2019   LDLDIRECT 168.0 02/21/2019   LDLCALC 114 04/28/2015   ALT 8 01/03/2018   AST 11 01/03/2018   NA 131 (L) 10/04/2019   K 4.7 10/04/2019   CL 93 (L) 10/04/2019   CREATININE 1.11 (H) 10/04/2019   BUN 15 10/04/2019   CO2 29 10/04/2019   TSH 2.300 12/17/2018   INR 1.07 08/16/2016   HGBA1C 7.3 (H) 02/21/2019   MICROALBUR 0.4 05/22/2014    Assessment/Plan:  Essential hypertension -Controlled -Continue current medications including Norvasc 5 mg, lisinopril 5 mg all propranolol 40 mg 3 times daily - Plan: CMP with eGFR([facility])  Chronic obstructive pulmonary disease, unspecified COPD type ([place]) -Stable -Continue follow-up with pulmonology -Continue current medications including ipratropium nasal spray,Stiloto Respimat  Chronic pain of both knees  -Patient encouraged to follow-up with Ortho given history of right TKR -Discussed stretching exercises.  Patient declines - Plan: CBC (no diff)  Acquired hypothyroidism  -Continue Synthroid 125 mcg daily - Plan: TSH  Controlled type 2 diabetes mellitus without complication, without long-term current use of insulin (HCC) -Hemoglobin A1c 7.3% on 02/21/2019 -Diet controlled -Discussed the importance of lifestyle modifications - Plan: Hemoglobin A1c, Lipid panel  Encounter for hepatitis C screening test for low risk patient - Plan: Hep C Antibody, Hep C Antibody  F/u in the next few  months  Grier Mitts, MD

## 2020-03-03 LAB — COMPLETE METABOLIC PANEL WITH GFR
AG Ratio: 1.5 (calc) (ref 1.0–2.5)
ALT: 15 U/L (ref 6–29)
AST: 14 U/L (ref 10–35)
Albumin: 4.1 g/dL (ref 3.6–5.1)
Alkaline phosphatase (APISO): 76 U/L (ref 37–153)
BUN/Creatinine Ratio: 13 (calc) (ref 6–22)
BUN: 14 mg/dL (ref 7–25)
CO2: 28 mmol/L (ref 20–32)
Calcium: 9.7 mg/dL (ref 8.6–10.4)
Chloride: 91 mmol/L — ABNORMAL LOW (ref 98–110)
Creat: 1.05 mg/dL — ABNORMAL HIGH (ref 0.60–0.93)
GFR, Est African American: 60 mL/min/{1.73_m2} (ref >=60)
GFR, Est Non African American: 52 mL/min/{1.73_m2} — ABNORMAL LOW (ref >=60)
Globulin: 2.8 g/dL (calc) (ref 1.9–3.7)
Glucose, Bld: 175 mg/dL — ABNORMAL HIGH (ref 65–99)
Potassium: 4.9 mmol/L (ref 3.5–5.3)
Sodium: 130 mmol/L — ABNORMAL LOW (ref 135–146)
Total Bilirubin: 0.7 mg/dL (ref 0.2–1.2)
Total Protein: 6.9 g/dL (ref 6.1–8.1)

## 2020-03-03 LAB — CBC
HCT: 41.5 % (ref 35.0–45.0)
Hemoglobin: 13.8 g/dL (ref 11.7–15.5)
MCH: 29.3 pg (ref 27.0–33.0)
MCHC: 33.3 g/dL (ref 32.0–36.0)
MCV: 88.1 fL (ref 80.0–100.0)
MPV: 9.7 fL (ref 7.5–12.5)
Platelets: 379 10*3/uL (ref 140–400)
RBC: 4.71 10*6/uL (ref 3.80–5.10)
RDW: 13.9 % (ref 11.0–15.0)
WBC: 5.9 10*3/uL (ref 3.8–10.8)

## 2020-03-03 LAB — LIPID PANEL
Cholesterol: 338 mg/dL — ABNORMAL HIGH (ref <200)
HDL: 52 mg/dL (ref >=50)
LDL Cholesterol (Calc): 219 mg/dL (calc) — ABNORMAL HIGH
Non-HDL Cholesterol (Calc): 286 mg/dL (calc) — ABNORMAL HIGH (ref <130)
Total CHOL/HDL Ratio: 6.5 (calc) — ABNORMAL HIGH (ref <5.0)
Triglycerides: 397 mg/dL — ABNORMAL HIGH (ref <150)

## 2020-03-03 LAB — HEPATITIS C ANTIBODY
Hepatitis C Ab: NONREACTIVE
SIGNAL TO CUT-OFF: 0.01 (ref <1.00)

## 2020-03-03 LAB — HEMOGLOBIN A1C
Hgb A1c MFr Bld: 9.2 % of total Hgb — ABNORMAL HIGH (ref <5.7)
Mean Plasma Glucose: 217 mg/dL
eAG (mmol/L): 12 mmol/L

## 2020-03-03 LAB — TSH: TSH: 1.75 mIU/L (ref 0.40–4.50)

## 2020-03-04 ENCOUNTER — Telehealth: Payer: Self-pay | Admitting: Family Medicine

## 2020-03-04 NOTE — Telephone Encounter (Signed)
Patient have questions about test results. Please call [phone].

## 2020-03-09 NOTE — Telephone Encounter (Signed)
See result note.  

## 2020-03-10 ENCOUNTER — Other Ambulatory Visit: Payer: Self-pay | Admitting: *Deleted

## 2020-03-10 DIAGNOSIS — Z87891 Personal history of nicotine dependence: Secondary | ICD-10-CM

## 2020-03-16 ENCOUNTER — Other Ambulatory Visit: Payer: Self-pay | Admitting: Internal Medicine

## 2020-03-18 ENCOUNTER — Telehealth (INDEPENDENT_AMBULATORY_CARE_PROVIDER_SITE_OTHER): Payer: Medicare Other | Admitting: Family Medicine

## 2020-03-18 ENCOUNTER — Encounter: Payer: Self-pay | Admitting: Family Medicine

## 2020-03-18 ENCOUNTER — Other Ambulatory Visit: Payer: Self-pay

## 2020-03-18 DIAGNOSIS — E782 Mixed hyperlipidemia: Secondary | ICD-10-CM

## 2020-03-18 DIAGNOSIS — E1169 Type 2 diabetes mellitus with other specified complication: Secondary | ICD-10-CM | POA: Diagnosis not present

## 2020-03-18 DIAGNOSIS — M25561 Pain in right knee: Secondary | ICD-10-CM | POA: Diagnosis not present

## 2020-03-18 DIAGNOSIS — G8929 Other chronic pain: Secondary | ICD-10-CM

## 2020-03-18 DIAGNOSIS — Z96651 Presence of right artificial knee joint: Secondary | ICD-10-CM

## 2020-03-18 MED ORDER — METFORMIN HCL 500 MG PO TABS: 500.0000 mg | ORAL_TABLET | Freq: Every day | ORAL | 3 refills | Status: DC

## 2020-03-18 MED ORDER — ROSUVASTATIN CALCIUM 5 MG PO TABS: 5.0000 mg | ORAL_TABLET | Freq: Every day | ORAL | 3 refills | Status: DC

## 2020-03-18 NOTE — Progress Notes (Signed)
Virtual Visit via Telephone Note  I connected with Destiny Hughes on 03/18/20 at 11:30 AM EST by telephone and verified that I am speaking with the correct person using two identifiers.   I discussed the limitations, risks, security and privacy concerns of performing an evaluation and management service by telephone and the availability of in person appointments. I also discussed with the patient that there may be a patient responsible charge related to this service. The patient expressed understanding and agreed to proceed.  Location patient: home Location provider: work or home office Participants present for the call: patient, provider Patient did not have a visit in the prior 7 days to address this/these issue(s).   History of Present Illness: Pt is a 74 yo female with pmh sig for SVT, paroxysmal afib s/p RFA, HTN, MVP, Emphysema, COPD, IBS, GERD, hypothyroidism, Dementia, OA s/p R TKR.  Pt notes a soreness in R upper back/flank that started today.  Pt states she pulled a muscles.  Does not recall injury. Pt also with continued knee pain and edema.  Pt has seen Dr. Ophelia Charter and Dr. Charlann Boxer for R TKR ~8 yrs.  She went to PT for a few wks but did not feel like it helped, just made her knee worse.  Pt would like to start medication for elevated cholesterol and DM.  [name] son has DM.   Observations/Objective: Patient sounds cheerful and well on the phone. I do not appreciate any SOB. Speech and thought processing are grossly intact. Patient reported vitals:  Assessment and Plan: Type 2 diabetes mellitus with other specified complication, without long-term current use of insulin (HCC)  -per chart review prior h/o CHF noted.  Pt denies this.  ECHO 2017 with grade 2 diastolic dysfunction. -given the above slightly limited in medication options and pt adamant about not starting an injectable medication. -will start metformin 500 mg daily -Discussed the importance of lifestyle modifications  including decreasing the amount of sweets eaten - Plan: metFORMIN (GLUCOPHAGE) 500 MG tablet  Mixed hyperlipidemia -Total cholesterol 338, LDL 219, triglycerides 397 on 03/02/2020 -Discussed lifestyle modifications - Plan: rosuvastatin (CRESTOR) 5 MG tablet  Chronic pain of right knee -Given history of right TKR patient advised to follow-up with Ortho. -New Ortho referral placed as patient interested in second opinion. - Plan: Ambulatory referral to Orthopedic Surgery  Status post total right knee replacement - Plan: Ambulatory referral to Orthopedic Surgery   Follow Up Instructions: F/u in 4-6  wks to recheck blood sugar and cholesterol  [id number] 5-10 [id number] 11-20 [id number] 21-30 I did not refer this patient for an OV in the next 24 hours for this/these issue(s).  I discussed the assessment and treatment plan with the patient. The patient was provided an opportunity to ask questions and all were answered. The patient agreed with the plan and demonstrated an understanding of the instructions.   The patient was advised to call back or seek an in-person evaluation if the symptoms worsen or if the condition fails to improve as anticipated.  I provided 30 minutes of non-face-to-face time during this encounter.   Deeann Saint, MD

## 2020-03-18 NOTE — Patient Instructions (Signed)

## 2020-03-23 ENCOUNTER — Other Ambulatory Visit: Payer: Self-pay | Admitting: Family Medicine

## 2020-03-24 ENCOUNTER — Ambulatory Visit: Payer: Medicare Other | Admitting: Internal Medicine

## 2020-03-27 ENCOUNTER — Telehealth: Payer: Self-pay | Admitting: Radiology

## 2020-03-27 ENCOUNTER — Ambulatory Visit (INDEPENDENT_AMBULATORY_CARE_PROVIDER_SITE_OTHER): Payer: Medicare Other | Admitting: Orthopaedic Surgery

## 2020-03-27 ENCOUNTER — Encounter: Payer: Self-pay | Admitting: Orthopaedic Surgery

## 2020-03-27 ENCOUNTER — Ambulatory Visit (INDEPENDENT_AMBULATORY_CARE_PROVIDER_SITE_OTHER): Payer: Medicare Other

## 2020-03-27 VITALS — BP 134/70 | HR 62 | Ht 66.0 in | Wt 195.0 lb

## 2020-03-27 DIAGNOSIS — M25552 Pain in left hip: Secondary | ICD-10-CM | POA: Diagnosis not present

## 2020-03-27 DIAGNOSIS — M1611 Unilateral primary osteoarthritis, right hip: Secondary | ICD-10-CM | POA: Diagnosis not present

## 2020-03-27 DIAGNOSIS — M25551 Pain in right hip: Secondary | ICD-10-CM

## 2020-03-27 DIAGNOSIS — M1711 Unilateral primary osteoarthritis, right knee: Secondary | ICD-10-CM

## 2020-03-27 DIAGNOSIS — Z96651 Presence of right artificial knee joint: Secondary | ICD-10-CM | POA: Diagnosis not present

## 2020-03-27 DIAGNOSIS — M1712 Unilateral primary osteoarthritis, left knee: Secondary | ICD-10-CM

## 2020-03-27 NOTE — Telephone Encounter (Signed)
Dr. Lorin Mercy would like for patient to have right hip injection with Dr. Ernestina Patches some time next week if at all possible. He does not think she will be able to have this with Dr. Junius Roads.  Please call patient to schedule.  Referral has been entered. Thanks.

## 2020-03-27 NOTE — Progress Notes (Addendum)
Office Visit Note   Patient: Destiny Hughes           Date of Birth: 05/22/1945           MRN: [id number] Visit Date: 03/27/2020              Requested by: Billie Ruddy, MD [place],  [place] [id number] PCP: Billie Ruddy, MD   Assessment & Plan: Visit Diagnoses:  1.  Unilateral primary osteoarthritis left knee.  2.  Post right knee revision.  3.  Right primary knee osteoarthritis.  Plan: I discussed with patient does not appear the pain in her distal thigh on the right is related to the total knee arthroplasty but is due to arthritis in the right hip.  We will set up for intra-articular injection under fluoroscopy into her right hip with Dr. Ernestina Patches with some local anesthesia and cortisone preparation and I can recheck her in a month.  Follow-Up Instructions: Return in about 1 month (around 04/27/2020).   Orders:  Orders Placed This Encounter  Procedures   XR Pelvis 1-2 Views   No orders of the defined types were placed in this encounter.     Procedures: Left knee injection prepped.  Patient tolerated procedure had improvement in symptoms anterolateral injection performed.   Clinical Data: No additional findings.   Subjective: Chief Complaint  Patient presents with   Right Knee - Pain   Left Knee - Pain    HPI 75 year old female here with pain just above her right knee and into her right knee.  Previous total knee arthroplasty by me many years ago with continued pain in her knee.  She had a revision surgery done by Dr.Olen.  Years later another bone scan which showed some evidence of loosening on the tibial side suggested by increased uptake on the bone scan.  Expiration showed that the tibial component cannot be removed femoral component was revised.  She has had bipolar on the left hip due to femoral neck fracture at some point in the distant past.  Spinal cord stimulator placed later removed. Patient states she had bilateral flex  agenic injections a month ago.  She states she did not get any relief.  Patient states she is also had increased problems with left knee pain which has known osteoarthritis and mild varus with medial joint line narrowing on February 2021 images.  She is requesting an injection in her left knee today. Review of Systems all other systems updated unchanged.  Negative for chills or fever no cellulitis no drainage from the right knee.   Objective: Vital Signs: BP 134/70    Pulse 62    Ht 5\' 6"  (1.676 m)    Wt 195 lb (88.5 kg)    BMI 31.47 kg/m   Physical Exam Constitutional:      Appearance: She is well-developed.  HENT:     Head: Normocephalic.     Right Ear: External ear normal.     Left Ear: External ear normal.  Eyes:     Pupils: Pupils are equal, round, and reactive to light.  Neck:     Thyroid: No thyromegaly.     Trachea: No tracheal deviation.  Cardiovascular:     Rate and Rhythm: Normal rate.  Pulmonary:     Effort: Pulmonary effort is normal.  Abdominal:     Palpations: Abdomen is soft.  Skin:    General: Skin is warm and dry.  Neurological:  Mental Status: She is alert and oriented to person, place, and time.  Psychiatric:        Mood and Affect: Mood and affect normal.        Behavior: Behavior normal.     Ortho Exam patient is amatory with a cane.  She has reproduction of her distal thigh pain with internal rotation of right hip limited to 10 to 15 degrees.  Internal and external rotation left hip is nonpainful.  With the logroll the right hip she has anterior groin pain and then distal thigh pain on the right similar to the pain that she suffers from with ambulation.  Specialty Comments:  No specialty comments available.  Imaging: No results found.   PMFS History: Patient Active Problem List   Diagnosis Date Noted   Unilateral primary osteoarthritis, left knee 03/30/2020   History of fusion of cervical spine 07/03/2019   Spinal cord stimulator  dysfunction ([place]) 06/04/2018   Chronic cough 05/21/2018   Tremor 02/16/2018   Chronic rhinitis 02/05/2018   Hallucinations    Psychosis ([place]) 11/07/2016   Acute respiratory failure with hypoxia and hypercapnia ([place]) 11/03/2016   Other chest pain 10/23/2016   Tobacco abuse 10/23/2016   Major depression with psychotic features ([place]) 10/19/2016   Dementia with behavioral disturbance ([place]) 10/19/2016   Depression due to physical illness    Cognitive retention disorder    Atrial flutter ([place]) 08/15/2016   Paroxysmal atrial fibrillation ([facility]) 08/15/2016   SVT (supraventricular tachycardia) ([place]) 07/11/2016   Weight loss 07/08/2016   AKI (acute kidney injury) ([place]) 07/02/2016   Acute kidney injury ([place]) 07/01/2016   Hyperglycemia 07/01/2016   Hyponatremia 07/01/2016   Intractable nausea and vomiting 07/01/2016   Abnormal EKG [id number]   Diastolic CHF ([place]) [id number]   Medication management 04/28/2015   Emphysema lung ([place]) 12/26/2014   Gastroesophageal reflux disease without esophagitis 12/26/2014   Chronic bronchitis ([place]) 11/13/2014   COPD (chronic obstructive pulmonary disease) ([place]) 10/03/2014   At high risk for falls 10/03/2014   Other specified hypothyroidism 10/03/2014   Mitral valve prolapse 10/03/2014   Encounter for Medicare annual wellness exam 10/03/2014   IBS (irritable bowel syndrome) 04/01/2014   S/P total knee replacement 04/22/2013   Knee pain, chronic 04/22/2013   Essential hypertension 04/15/2013   Vitamin D deficiency 04/15/2013   Encounter for long-term (current) use of medications 04/15/2013   Prediabetes 04/15/2013   Hyperlipemia    Glaucoma    Chronic pain disorder    Chronic back pain    Past Medical History:  Diagnosis Date   Arthritis    OA- knees & back  neck   Chronic back pain    Cognitive retention disorder    Collapse of right lung    COPD (chronic obstructive pulmonary disease) ([facility])     Hx. Bronchitis occ, 01-25-11 somes issue now taking  Z-pak-started 01-24-11/Scar tissue  present  from previous lung collapse   Dementia with psychosis ([place])    Depression    Depression    Dyspnea    Dysrhythmia 2018   treated with Ablation    GERD (gastroesophageal reflux disease)    tx. TUMS   Glaucoma    Headache    History of kidney stones    surgically treated   Hyperlipemia    Hypertension    Hypothyroidism    tx. Levothyroxine   Major depression with psychotic features [facility])    Nephrolithiasis    Neuromuscular disorder ([place]) 01-25-11   Pain/nerve stimulator  implanted -2 yrs ago.   Reflex, gag, absent    Reflex, gag, absent     Family History  Problem Relation Age of Onset   Heart attack Father    Emphysema Father    Aneurysm Mother        CEREBRAL   Emphysema Mother    Dementia Mother    Diabetes Son    Hypertension Son    Hypertension Son    Cancer Sister        Widely Metastatic   Asthma Son        x2   Breast cancer Paternal Aunt    Rheum arthritis Maternal Aunt     Past Surgical History:  Procedure Laterality Date   A-FLUTTER ABLATION N/A 08/16/2016   Procedure: A-Flutter Ablation;  Surgeon: Evans Lance, MD;  Location: [facility];  Service: Cardiovascular;  Laterality: N/A;   ANTERIOR CERVICAL DECOMP/DISCECTOMY FUSION     3 levels, per family   APPENDECTOMY  01-25-11   CARPAL TUNNEL RELEASE  01-25-11   Bil.   CERVICAL SPINE SURGERY  01-25-11   2005-multiple levels   CHOLECYSTECTOMY  01-25-11   '07   COLONOSCOPY WITH PROPOFOL N/A 07/23/2012   Procedure: COLONOSCOPY WITH PROPOFOL;  Surgeon: Garlan Fair, MD;  [facility];  Service: Endoscopy;  Laterality: N/A;   detatched retna Bilateral    done July & August- 2019   EYE SURGERY Bilateral 2019   repair of retina detachment- Dr. Zadie Rhine at surgical center    JOINT REPLACEMENT  01-25-11   6'03 [place]  01-25-11    '04-right, revised x2   no gag reflex     Pt does not havea gag reflex following  siurgery to neck. Family unsure of date. Pt takes pill in apple sauce.   SPINAL CORD STIMULATOR BATTERY EXCHANGE N/A 01/12/2018   Procedure: Spinal cord stimulator battery replacement;  Surgeon: Clydell Hakim, MD;  Location: [facility];  Service: Neurosurgery;  Laterality: N/A;  left   SPINAL CORD STIMULATOR IMPLANT  2009 APPROX   DR. ELSNER   SPINAL CORD STIMULATOR REMOVAL N/A 06/04/2018   Procedure: Removal of spinal cord stimulator, thoracic paddle and generator;  Surgeon: Kristeen Miss, MD;  Location: [facility];  Service: Neurosurgery;  Laterality: N/A;   THORACIC SPINE SURGERY  01-25-11   6'02- then nerve stimulator implanted after   TOTAL KNEE REVISION  02/01/2011   Procedure: TOTAL KNEE REVISION;  Surgeon: Mauri Pole;  [facility];  Service: Orthopedics;  Laterality: Right;   TUBAL LIGATION     Social History   Occupational History   Occupation: disabled  Tobacco Use   Smoking status: Former Smoker    Packs/day: 0.75    Years: 50.00    Pack years: 37.50    Types: Cigarettes    Start date: 09/13/1961    Quit date: 05/2016    Years since quitting: 3.8   Smokeless tobacco: Never Used   Tobacco comment: Decreased Down to 2/3 a Day if Thinking Abouit It  Vaping Use   Vaping Use: Never used  Substance and Sexual Activity   Alcohol use: No    Alcohol/week: 0.0 standard drinks    Comment: none in 10 years   Drug use: No   Sexual activity: Never

## 2020-03-30 DIAGNOSIS — M1711 Unilateral primary osteoarthritis, right knee: Secondary | ICD-10-CM | POA: Insufficient documentation

## 2020-03-30 DIAGNOSIS — M1712 Unilateral primary osteoarthritis, left knee: Secondary | ICD-10-CM | POA: Insufficient documentation

## 2020-03-30 DIAGNOSIS — M1611 Unilateral primary osteoarthritis, right hip: Secondary | ICD-10-CM | POA: Insufficient documentation

## 2020-03-30 NOTE — Telephone Encounter (Signed)
Thank you.  Can [name] a week later. thanks

## 2020-03-30 NOTE — Telephone Encounter (Signed)
Patient is scheduled for hip injection on 04/14/2019.  Dr. Lorin Mercy would like to see her back in the office one week after that to see how the injection worked. Could you please call patient and make appointment?

## 2020-03-30 NOTE — Telephone Encounter (Signed)
I called and offered a work in appointment this Wednesday, but patient's husband states that they cannot come that day. Scheduled for 1/24.

## 2020-03-30 NOTE — Telephone Encounter (Signed)
FYI

## 2020-04-06 ENCOUNTER — Other Ambulatory Visit: Payer: Self-pay | Admitting: Internal Medicine

## 2020-04-06 ENCOUNTER — Other Ambulatory Visit: Payer: Self-pay | Admitting: Family Medicine

## 2020-04-13 ENCOUNTER — Ambulatory Visit: Payer: Self-pay

## 2020-04-13 ENCOUNTER — Encounter: Payer: Self-pay | Admitting: Physical Medicine and Rehabilitation

## 2020-04-13 ENCOUNTER — Ambulatory Visit (INDEPENDENT_AMBULATORY_CARE_PROVIDER_SITE_OTHER): Payer: Medicare Other | Admitting: Physical Medicine and Rehabilitation

## 2020-04-13 ENCOUNTER — Other Ambulatory Visit: Payer: Self-pay

## 2020-04-13 DIAGNOSIS — M25551 Pain in right hip: Secondary | ICD-10-CM

## 2020-04-13 MED ORDER — BUPIVACAINE HCL 0.25 % IJ SOLN
4.0000 mL | INTRAMUSCULAR | Status: AC | PRN
  Administered 2020-04-13: 4 mL via INTRA_ARTICULAR

## 2020-04-13 MED ORDER — TRIAMCINOLONE ACETONIDE 40 MG/ML IJ SUSP
60.0000 mg | INTRAMUSCULAR | Status: AC | PRN
  Administered 2020-04-13: 60 mg via INTRA_ARTICULAR

## 2020-04-13 NOTE — Progress Notes (Addendum)
   Destiny Hughes - 75 y.o. female MRN [id number]  Date of birth: December 21, 1945  Office Visit Note: Visit Date: 04/13/2020 PCP: Billie Ruddy, MD Referred by: Billie Ruddy, MD  Subjective: Chief Complaint  Patient presents with  . Right Hip - Pain  . Right Knee - Pain   HPI:  Destiny Hughes is a 75 y.o. female who comes in today at the request of Dr. Rodell Perna for planned Right anesthetic hip arthrogram with fluoroscopic guidance.  The patient has failed conservative care including home exercise, medications, time and activity modification.  This injection will be diagnostic and hopefully therapeutic.  Please see requesting physician notes for further details and justification.   ROS Otherwise per HPI.  Assessment & Plan: Visit Diagnoses:    ICD-10-CM   1. Pain in right hip  M25.551 Large Joint Inj: R hip joint    XR C-ARM NO REPORT    bupivacaine (MARCAINE) 0.25 % (with pres) injection 4 mL    triamcinolone acetonide (KENALOG-40) injection 60 mg    Plan: No additional findings.   Meds & Orders:  Meds ordered this encounter  Medications  . bupivacaine (MARCAINE) 0.25 % (with pres) injection 4 mL  . triamcinolone acetonide (KENALOG-40) injection 60 mg    Orders Placed This Encounter  Procedures  . Large Joint Inj: R hip joint  . XR C-ARM NO REPORT    Follow-up: Return in about 2 weeks (around 04/27/2020) for Rodell Perna, MD.   Procedures: Large Joint Inj: R hip joint on 04/13/2020 8:13 AM Indications: pain and diagnostic evaluation Details: 22 G needle, anterior approach  Arthrogram: Yes  Medications: 4 mL bupivacaine 0.25 %; 60 mg triamcinolone acetonide 40 MG/ML Outcome: tolerated well, no immediate complications  Arthrogram demonstrated excellent flow of contrast throughout the joint surface without extravasation or obvious defect.  The patient DID NOT have relief of symptoms during the anesthetic phase of the injection.  Procedure, treatment  alternatives, risks and benefits explained, specific risks discussed. Consent was given by the patient. Immediately prior to procedure a time out was called to verify the correct patient, procedure, equipment, support staff and site/side marked as required. Patient was prepped and draped in the usual sterile fashion.          Clinical History: No specialty comments available.     Objective:  VS:  HT:    WT:   BMI:     BP:   HR: bpm  TEMP: ( )  RESP:  Physical Exam   Imaging: No results found.

## 2020-04-13 NOTE — Progress Notes (Signed)
Pt state right hip pain that down to her right knee. Pt state walking and standing makes the pain worse. Pt state over the counter pain meds and cream helps ease the pain.  Numeric Pain Rating Scale and Functional Assessment Average Pain 9   In the last MONTH (on 0-10 scale) has pain interfered with the following?  1. General activity like being  able to carry out your everyday physical activities such as walking, climbing stairs, carrying groceries, or moving a chair?  Rating(10)

## 2020-04-18 ENCOUNTER — Other Ambulatory Visit: Payer: Self-pay | Admitting: Family Medicine

## 2020-04-21 ENCOUNTER — Encounter: Payer: Self-pay | Admitting: Orthopaedic Surgery

## 2020-04-21 ENCOUNTER — Other Ambulatory Visit: Payer: Self-pay

## 2020-04-21 ENCOUNTER — Ambulatory Visit (INDEPENDENT_AMBULATORY_CARE_PROVIDER_SITE_OTHER): Payer: Medicare Other | Admitting: Orthopaedic Surgery

## 2020-04-21 VITALS — BP 126/76 | Ht 65.0 in | Wt 192.0 lb

## 2020-04-21 DIAGNOSIS — M1611 Unilateral primary osteoarthritis, right hip: Secondary | ICD-10-CM | POA: Diagnosis not present

## 2020-04-21 NOTE — Progress Notes (Signed)
Office Visit Note   Patient: Destiny Hughes           Date of Birth: November 15, 1945           MRN: [id number] Visit Date: 04/21/2020              Requested by: Billie Ruddy, MD [place],  [place] [address] PCP: Billie Ruddy, MD   Assessment & Plan: Visit Diagnoses:  1. Unilateral primary osteoarthritis, right hip     Plan: Patient's pain is worse when she standing and walking.  Still walks with her cane.  She did not get relief with lidocaine injected into her hip.  She had previous lumbar myelogram CT scan done by Dr. Ellene Route 2011 which showed synovial cyst at L2-3 on the right side encroaching on the canal.  Patient states she thinks it is her hip but I discussed with her since she really did not get improvement with the hip injection I am not sure that doing a right total hip arthroplasty would allow her to ambulate better.  She will use her walker walk sit and rest.  We discussed possible MRI scan of her back now that her spinal cord stimulator is removed she decides she wants to proceed with this we can check to see if there was compression on the right side that would correspond her 5 pain.  We reviewed in detail the previous images of her total knee arthroplasty suggest that the pain is not coming from the total knee.  She walks with a cane she really cannot use the walker inside her house.  Once weather is better she get out walk some outside walks it rest and repeat.  Recheck 2 months.  Follow-Up Instructions: Return in about 2 months (around 06/19/2020).   Orders:  No orders of the defined types were placed in this encounter.  No orders of the defined types were placed in this encounter.     Procedures: No procedures performed   Clinical Data: No additional findings.   Subjective: Chief Complaint  Patient presents with  . Right Hip - Pain, Follow-up    HPI 75 year old female returns with ongoing pain in her right thigh.  X-ray showed  hip osteoarthritis and intra-articular injection she did not get relief during the anesthetic phase with the local.  She states afterwards it was somewhat numb for a while but she still has pain when she walks and states after she stands for a period of time or walks pain increases.  She is walking with a cane.  She has a walker but she does not like to use it.  Previous total knee arthroplasty on the right she states she is continuing to have ongoing distal thigh pain and underwent a revision procedure which was later attempted to be revised the second time due to ongoing pain and only one of the 2 sides could be revised with inability to remove the tibial component.  She has had back pain and had a synovial cyst at L3-4 on the right.  She had a spinal cord stimulator did not get better.  She was on chronic pain management for several years now is off the medication.  Review of Systems 14 point update unchanged from last office visit.   Objective: Vital Signs: BP 126/76   Ht 5\' 5"  (1.651 m)   Wt 192 lb (87.1 kg)   BMI 31.95 kg/m   Physical Exam Constitutional:  Appearance: She is well-developed.  HENT:     Head: Normocephalic.     Right Ear: External ear normal.     Left Ear: External ear normal.  Eyes:     Pupils: Pupils are equal, round, and reactive to light.  Neck:     Thyroid: No thyromegaly.     Trachea: No tracheal deviation.  Cardiovascular:     Rate and Rhythm: Normal rate.  Pulmonary:     Effort: Pulmonary effort is normal.  Abdominal:     Palpations: Abdomen is soft.  Skin:    General: Skin is warm and dry.  Neurological:     Mental Status: She is alert and oriented to person, place, and time.  Psychiatric:        Mood and Affect: Mood and affect normal.        Behavior: Behavior normal.     Ortho Exam patient has only mild discomfort in the right groin with internal rotation of her hip.  Specialty Comments:  No specialty comments  available.  Imaging: No results found.   PMFS History: Patient Active Problem List   Diagnosis Date Noted  . Unilateral primary osteoarthritis, right hip 03/30/2020  . Unilateral primary osteoarthritis, left knee 03/30/2020  . History of fusion of cervical spine 07/03/2019  . Spinal cord stimulator dysfunction ([place]) 06/04/2018  . Chronic cough 05/21/2018  . Tremor 02/16/2018  . Chronic rhinitis 02/05/2018  . Hallucinations   . Psychosis ([place]) 11/07/2016  . Acute respiratory failure with hypoxia and hypercapnia ([place]) 11/03/2016  . Other chest pain 10/23/2016  . Tobacco abuse 10/23/2016  . Major depression with psychotic features ([place]) 10/19/2016  . Dementia with behavioral disturbance ([place]) 10/19/2016  . Depression due to physical illness   . Cognitive retention disorder   . Atrial flutter ([place]) 08/15/2016  . Paroxysmal atrial fibrillation ([place]) 08/15/2016  . SVT (supraventricular tachycardia) ([place]) 07/11/2016  . Weight loss 07/08/2016  . AKI (acute kidney injury) ([place]) 07/02/2016  . Acute kidney injury ([place]) 07/01/2016  . Hyperglycemia 07/01/2016  . Hyponatremia 07/01/2016  . Intractable nausea and vomiting 07/01/2016  . Abnormal EKG 07/01/2016  . Diastolic CHF ([place]) 50/53/9767  . Medication management 04/28/2015  . Emphysema lung ([place]) 12/26/2014  . Gastroesophageal reflux disease without esophagitis 12/26/2014  . Chronic bronchitis ([place]) 11/13/2014  . COPD (chronic obstructive pulmonary disease) ([place]) 10/03/2014  . At high risk for falls 10/03/2014  . Other specified hypothyroidism 10/03/2014  . Mitral valve prolapse 10/03/2014  . Encounter for Medicare annual wellness exam 10/03/2014  . IBS (irritable bowel syndrome) 04/01/2014  . S/P total knee replacement 04/22/2013  . Knee pain, chronic 04/22/2013  . Essential hypertension 04/15/2013  . Vitamin D deficiency 04/15/2013  . Encounter for long-term (current) use of medications 04/15/2013  . Prediabetes 04/15/2013   . Hyperlipemia   . Glaucoma   . Chronic pain disorder   . Chronic back pain    Past Medical History:  Diagnosis Date  . Arthritis    OA- knees & back  neck  . Chronic back pain   . Cognitive retention disorder   . Collapse of right lung   . COPD (chronic obstructive pulmonary disease) (H[facility])    Hx. Bronchitis occ, 01-25-11 somes issue now taking  Z-pak-started 01-24-11/Scar tissue  present  from previous lung collapse  . Dementia with psychosis ([place])   . Depression   . Depression   . Dyspnea   . Dysrhythmia 2018   treated with Ablation   .  GERD (gastroesophageal reflux disease)    tx. TUMS  . Glaucoma   . Headache   . History of kidney stones    surgically treated  . Hyperlipemia   . Hypertension   . Hypothyroidism    tx. Levothyroxine  . Major depression with psychotic features ([place])   . Nephrolithiasis   . Neuromuscular disorder ([place]) 01-25-11   Pain/nerve stimulator implanted -2 yrs ago.  Marland Kitchen Reflex, gag, absent   . Reflex, gag, absent     Family History  Problem Relation Age of Onset  . Heart attack Father   . Emphysema Father   . Aneurysm Mother        CEREBRAL  . Emphysema Mother   . Dementia Mother   . Diabetes Son   . Hypertension Son   . Hypertension Son   . Cancer Sister        Widely Metastatic  . Asthma Son        x2  . Breast cancer Paternal Aunt   . Rheum arthritis Maternal Aunt     Past Surgical History:  Procedure Laterality Date  . A-FLUTTER ABLATION N/A 08/16/2016   Procedure: A-Flutter Ablation;  Surgeon: Evans Lance, MD;  Location: [facility];  Service: Cardiovascular;  Laterality: N/A;  . ANTERIOR CERVICAL DECOMP/DISCECTOMY FUSION     3 levels, per family  . APPENDECTOMY  01-25-11  . CARPAL TUNNEL RELEASE  01-25-11   Bil.  . CERVICAL SPINE SURGERY  01-25-11   2005-multiple levels  . CHOLECYSTECTOMY  01-25-11   '07  . COLONOSCOPY WITH PROPOFOL N/A 07/23/2012   Procedure: COLONOSCOPY WITH PROPOFOL;  Surgeon: Garlan Fair,  MD;  [facility];  Service: Endoscopy;  Laterality: N/A;  . detatched retna Bilateral    done July & August- 2019  . EYE SURGERY Bilateral 2019   repair of retina detachment- Dr. Zadie Rhine at surgical center   . JOINT REPLACEMENT  01-25-11   6'03 [id number]-hemi  . KNEE ARTHROPLASTY  01-25-11   '04-right, revised x2  . no gag reflex     Pt does not havea gag reflex following  siurgery to neck. Family unsure of date. Pt takes pill in apple sauce.  . SPINAL CORD STIMULATOR BATTERY EXCHANGE N/A 01/12/2018   Procedure: Spinal cord stimulator battery replacement;  Surgeon: Clydell Hakim, MD;  Location: [facility];  Service: Neurosurgery;  Laterality: N/A;  left  . SPINAL CORD STIMULATOR IMPLANT  2009 APPROX   DR. ELSNER  . SPINAL CORD STIMULATOR REMOVAL N/A 06/04/2018   Procedure: Removal of spinal cord stimulator, thoracic paddle and generator;  Surgeon: Kristeen Miss, MD;  Location: [facility];  Service: Neurosurgery;  Laterality: N/A;  . THORACIC SPINE SURGERY  01-25-11   6'02- then nerve stimulator implanted after  . TOTAL KNEE REVISION  02/01/2011   Procedure: TOTAL KNEE REVISION;  Surgeon: Mauri Pole;  [facility];  Service: Orthopedics;  Laterality: Right;  . TUBAL LIGATION     Social History   Occupational History  . Occupation: disabled  Tobacco Use  . Smoking status: Former Smoker    Packs/day: 0.75    Years: 50.00    Pack years: 37.50    Types: Cigarettes    Start date: 09/13/1961    Quit date: 05/2016    Years since quitting: 3.9  . Smokeless tobacco: Never Used  . Tobacco comment: Decreased Down to 2/3 a Day if Thinking Abouit It  Vaping Use  . Vaping Use: Never  used  Substance and Sexual Activity  . Alcohol use: No    Alcohol/week: 0.0 standard drinks    Comment: none in 10 years  . Drug use: No  . Sexual activity: Never

## 2020-04-22 ENCOUNTER — Ambulatory Visit (INDEPENDENT_AMBULATORY_CARE_PROVIDER_SITE_OTHER): Payer: Medicare Other | Admitting: Acute Care

## 2020-04-22 ENCOUNTER — Ambulatory Visit
Admission: RE | Admit: 2020-04-22 | Discharge: 2020-04-22 | Disposition: A | Discharge status: Home / Self Care | Payer: Medicare Other | Source: Ambulatory Visit | Attending: Acute Care | Admitting: Acute Care

## 2020-04-22 ENCOUNTER — Encounter: Payer: Self-pay | Admitting: Acute Care

## 2020-04-22 DIAGNOSIS — Z87891 Personal history of nicotine dependence: Secondary | ICD-10-CM

## 2020-04-22 DIAGNOSIS — M47814 Spondylosis without myelopathy or radiculopathy, thoracic region: Secondary | ICD-10-CM | POA: Diagnosis not present

## 2020-04-22 DIAGNOSIS — J432 Centrilobular emphysema: Secondary | ICD-10-CM | POA: Diagnosis not present

## 2020-04-22 DIAGNOSIS — Z122 Encounter for screening for malignant neoplasm of respiratory organs: Secondary | ICD-10-CM

## 2020-04-22 DIAGNOSIS — I2510 Atherosclerotic heart disease of native coronary artery without angina pectoris: Secondary | ICD-10-CM | POA: Diagnosis not present

## 2020-04-22 NOTE — Patient Instructions (Signed)
Thank you for participating in the [facility]. It was our pleasure to meet you today. We will call you with the results of your scan within the next few days. Your scan will be assigned a Lung RADS category score by the physicians reading the scans.  This Lung RADS score determines follow up scanning.  See below for description of categories, and follow up screening recommendations. We will be in touch to schedule your follow up screening annually or based on recommendations of our providers. We will fax a copy of your scan results to your Primary Care Physician, or the physician who referred you to the program, to ensure they have the results. Please call the office if you have any questions or concerns regarding your scanning experience or results.  Our office number is [phone]. Please speak with Denise Phelps, RN. She is our Lung Cancer Screening RN. If she is unavailable when you call, please have the office staff send her a message. She will return your call at her earliest convenience. Remember, if your scan is normal, we will scan you annually as long as you continue to meet the criteria for the program. (Age 55-77, Current smoker or smoker who has quit within the last 15 years). If you are a smoker, remember, quitting is the single most powerful action that you can take to decrease your risk of lung cancer and other pulmonary, breathing related problems. We know quitting is hard, and we are here to help.  Please let us know if there is anything we can do to help you meet your goal of quitting. If you are a former smoker, congratulations. We are proud of you! Remain smoke free! Remember you can refer friends or family members through the number above.  We will screen them to make sure they meet criteria for the program. Thank you for helping us take better care of you by participating in Lung Screening.  Lung RADS Categories:  Lung RADS 1: no nodules  or definitely non-concerning nodules.  Recommendation is for a repeat annual scan in 12 months.  Lung RADS 2:  nodules that are non-concerning in appearance and behavior with a very low likelihood of becoming an active cancer. Recommendation is for a repeat annual scan in 12 months.  Lung RADS 3: nodules that are probably non-concerning , includes nodules with a low likelihood of becoming an active cancer.  Recommendation is for a 6-month repeat screening scan. Often noted after an upper respiratory illness. We will be in touch to make sure you have no questions, and to schedule your 6-month scan.  Lung RADS 4 A: nodules with concerning findings, recommendation is most often for a follow up scan in 3 months or additional testing based on our provider's assessment of the scan. We will be in touch to make sure you have no questions and to schedule the recommended 3 month follow up scan.  Lung RADS 4 B:  indicates findings that are concerning. We will be in touch with you to schedule additional diagnostic testing based on our provider's  assessment of the scan.   

## 2020-04-22 NOTE — Progress Notes (Signed)
Virtual Visit via Telephone Note  I connected with Destiny Hughes on 04/22/20 at  9:30 AM EST by telephone and verified that I am speaking with the correct person using two identifiers.  Location: Patient: At home Provider: At home   I discussed the limitations, risks, security and privacy concerns of performing an evaluation and management service by telephone and the availability of in person appointments. I also discussed with the patient that there may be a patient responsible charge related to this service. The patient expressed understanding and agreed to proceed.   Shared Decision Making Visit Lung Cancer Screening Program [phone])   Eligibility:  Age 75 y.o.  Pack Years Smoking History Calculation 37.5 pack year smoking history (# packs/per year x # years smoked)  Recent History of coughing up blood  no  Unexplained weight loss? no ( >Than 15 pounds within the last 6 months )  Prior History Lung / other cancer no (Diagnosis within the last 5 years already requiring surveillance chest CT Scans).  Smoking Status Former Smoker  Former Smokers: Years since quit: 3 years  Quit Date: 05/2016  Visit Components:  Discussion included one or more decision making aids. no  Discussion included risk/benefits of screening. no  Discussion included potential follow up diagnostic testing for abnormal scans. no  Discussion included meaning and risk of over diagnosis. no  Discussion included meaning and risk of False Positives. no  Discussion included meaning of total radiation exposure. no  Counseling Included:  Importance of adherence to annual lung cancer LDCT screening. yes  Impact of comorbidities on ability to participate in the program. yes  Ability and willingness to under diagnostic treatment. yes  Smoking Cessation Counseling:  Current Smokers:   Discussed importance of smoking cessation. yes  Information about tobacco cessation classes and  interventions provided to patient. yes  Patient provided with "ticket" for LDCT Scan. yes  Symptomatic Patient. no  CounselingNA  Diagnosis Code: Tobacco Use Z72.0  Asymptomatic Patient yes  Counseling (Intermediate counseling: > three minutes counseling) U0454  Former Smokers:   Discussed the importance of maintaining cigarette abstinence. yes  Diagnosis Code: Personal History of Nicotine Dependence. U98.119  Information about tobacco cessation classes and interventions provided to patient. Yes  Patient provided with "ticket" for LDCT Scan. yes  Written Order for Lung Cancer Screening with LDCT placed in Epic. Yes (CT Chest Lung Cancer Screening Low Dose W/O CM) [id number] Z12.2-Screening of respiratory organs Z87.891-Personal history of nicotine dependence  I spent 25 minutes of face to face time with Destiny Hughes discussing the risks and benefits of lung cancer screening. We viewed a power point together that explained in detail the above noted topics. We took the time to pause the power point at intervals to allow for questions to be asked and answered to ensure understanding. We discussed that she had taken the single most powerful action possible to decrease her risk of developing lung cancer when she quit smoking. I counseled her to remain smoke free, and to contact me if she ever had the desire to smoke again so that I can provide resources and tools to help support the effort to remain smoke free. We discussed the time and location of the scan, and that either  Doroteo Glassman RN or I will call with the results within  24-48 hours of receiving them. She has my card and contact information in the event she needs to speak with me, in addition to a copy of the power point  we reviewed as a resource. She verbalized understanding of all of the above and had no further questions upon leaving the office.     I explained to the patient that there has been a high incidence of coronary  artery disease noted on these exams. I explained that this is a non-gated exam therefore degree or severity cannot be determined. This patient is currently on statin therapy. I have asked the patient to follow-up with their PCP regarding any incidental finding of coronary artery disease and management with diet or medication as they feel is clinically indicated. The patient verbalized understanding of the above and had no further questions.     Magdalen Spatz, NP 04/22/2020 11:35 AM

## 2020-04-30 NOTE — Progress Notes (Signed)
Please call patient and let them  know their  low dose Ct was read as a Lung RADS 2: nodules that are benign in appearance and behavior with a very low likelihood of becoming a clinically active cancer due to size or lack of growth. Recommendation per radiology is for a repeat LDCT in 12 months. .Please let them  know we will order and schedule their  annual screening scan for 04/2021. Please let them  know there was notation of CAD on their  scan.  Please remind the patient  that this is a non-gated exam therefore degree or severity of disease  cannot be determined. Please have them  follow up with their PCP regarding potential risk factor modification, dietary therapy or pharmacologic therapy if clinically indicated. Pt.  is  currently on statin therapy. Please place order for annual  screening scan for  04/2021 and fax results to PCP. Thanks so much. Pt. is followed by cards.

## 2020-05-01 ENCOUNTER — Other Ambulatory Visit: Payer: Self-pay | Admitting: *Deleted

## 2020-05-01 DIAGNOSIS — Z87891 Personal history of nicotine dependence: Secondary | ICD-10-CM

## 2020-05-04 ENCOUNTER — Other Ambulatory Visit: Payer: Self-pay | Admitting: Family Medicine

## 2020-05-11 ENCOUNTER — Other Ambulatory Visit: Payer: Self-pay | Admitting: Internal Medicine

## 2020-05-12 ENCOUNTER — Other Ambulatory Visit: Payer: Self-pay

## 2020-05-12 ENCOUNTER — Encounter: Payer: Self-pay | Admitting: Internal Medicine

## 2020-05-12 ENCOUNTER — Ambulatory Visit (INDEPENDENT_AMBULATORY_CARE_PROVIDER_SITE_OTHER): Payer: Medicare Other | Admitting: Internal Medicine

## 2020-05-12 VITALS — BP 128/72 | HR 65 | Ht 65.0 in | Wt 179.8 lb

## 2020-05-12 DIAGNOSIS — I471 Supraventricular tachycardia: Secondary | ICD-10-CM | POA: Diagnosis not present

## 2020-05-12 DIAGNOSIS — I4892 Unspecified atrial flutter: Secondary | ICD-10-CM

## 2020-05-12 MED ORDER — AMLODIPINE BESYLATE 5 MG PO TABS: 5.0000 mg | ORAL_TABLET | Freq: Every day | ORAL | 3 refills | Status: DC

## 2020-05-12 MED ORDER — LISINOPRIL 5 MG PO TABS: 5.0000 mg | ORAL_TABLET | Freq: Every day | ORAL | 3 refills | Status: DC

## 2020-05-12 MED ORDER — PROPRANOLOL HCL 80 MG PO TABS: 40.0000 mg | ORAL_TABLET | Freq: Three times a day (TID) | ORAL | 3 refills | Status: DC

## 2020-05-12 NOTE — Patient Instructions (Signed)

## 2020-05-12 NOTE — Progress Notes (Signed)
HPI Destiny Hughes returns today for followup. She has a h/o SVT and atrial flutter and since she underwent catheter ablation in 2018 she has done well with no chest pain or sob. No syncope. She has gained weight. She denies dietary indiscretion.  Allergies  Allergen Reactions  . Albuterol Shortness Of Breath and Other (See Comments)  . Penicillins Anaphylaxis, Swelling and Other (See Comments)    Has patient had a PCN reaction causing immediate rash, facial/tongue/throat swelling, SOB or lightheadedness with hypotension: Yes Has patient had a PCN reaction causing severe rash involving mucus membranes or skin necrosis: Rash Has patient had a PCN reaction that required hospitalization: No Has patient had a PCN reaction occurring within the last 10 years: No If all of the above answers are "NO", then may proceed with Cephalosporin use.  . Sulfa Antibiotics Swelling and Other (See Comments)    Throat swells, but no shortness of breath noted  . Codeine Nausea And Vomiting  . Ecotrin [Aspirin] Nausea And Vomiting  . Zanaflex [Tizanidine] Other (See Comments)    Possible confusion     Current Outpatient Medications  Medication Sig Dispense Refill  . buPROPion (WELLBUTRIN XL) 150 MG 24 hr tablet Take 2 tablets (300 mg total) by mouth every morning. 60 tablet 6  . diphenhydramine-acetaminophen (TYLENOL PM) 25-500 MG TABS tablet Take 1 tablet by mouth at bedtime as needed.    . donepezil (ARICEPT) 10 MG tablet Take 1 tablet (10 mg total) by mouth at bedtime. 30 tablet 5  . fluticasone (FLONASE) 50 MCG/ACT nasal spray 1 OR 2 SPRAYS IN NOSE ONCE A DAY 16 g 5  . gabapentin (NEURONTIN) 100 MG capsule Take 1 capsule (100 mg total) by mouth 2 (two) times daily. 180 capsule 3  . ipratropium (ATROVENT) 0.03 % nasal spray PLACE 2 SPRAYS INTO BOTH NOSTRILS EVERY 12 HOURS 30 mL 2  . levothyroxine (SYNTHROID) 125 MCG tablet TAKE ONE TABLET AT BEDTIME 90 tablet 0  . loratadine (CLARITIN) 10 MG  tablet Take 10 mg by mouth daily.    . melatonin 5 MG TABS Take 5 mg by mouth at bedtime.    . metFORMIN (GLUCOPHAGE) 500 MG tablet Take 1 tablet (500 mg total) by mouth daily with breakfast. 30 tablet 3  . Misc Natural Products (JOINT HEALTH PO) Take by mouth.    . pantoprazole (PROTONIX) 40 MG tablet TAKE ONE TABLET DAILY 90 tablet 0  . rosuvastatin (CRESTOR) 5 MG tablet Take 1 tablet (5 mg total) by mouth daily. 30 tablet 3  . sodium chloride 1 g tablet TAKE ONE TABLET THREE TIMES DAILY 180 tablet 0  . temazepam (RESTORIL) 30 MG capsule Take 1 capsule (30 mg total) by mouth at bedtime. 30 capsule 5  . Tiotropium Bromide-Olodaterol (STIOLTO RESPIMAT) 2.5-2.5 MCG/ACT AERS Inhale 2 puffs into the lungs daily. 12 g 3  . amLODipine (NORVASC) 5 MG tablet Take 1 tablet (5 mg total) by mouth daily. 90 tablet 3  . lisinopril (ZESTRIL) 5 MG tablet Take 1 tablet (5 mg total) by mouth daily. 90 tablet 3  . propranolol (INDERAL) 80 MG tablet Take 0.5 tablets (40 mg total) by mouth 3 (three) times daily. 135 tablet 3   No current facility-administered medications for this visit.     Past Medical History:  Diagnosis Date  . Arthritis    OA- knees & back  neck  . Chronic back pain   . Cognitive retention disorder   . Collapse  of right lung   . COPD (chronic obstructive pulmonary disease) (HCC)    Hx. Bronchitis occ, 01-25-11 somes issue now taking  Z-pak-started 01-24-11/Scar tissue  present  from previous lung collapse  . Dementia with psychosis ([place])   . Depression   . Depression   . Dyspnea   . Dysrhythmia 2018   treated with Ablation   . GERD (gastroesophageal reflux disease)    tx. TUMS  . Glaucoma   . Headache   . History of kidney stones    surgically treated  . Hyperlipemia   . Hypertension   . Hypothyroidism    tx. Levothyroxine  . Major depression with psychotic features ([place])   . Nephrolithiasis   . Neuromuscular disorder ([place]) 01-25-11   Pain/nerve stimulator implanted -2 yrs  ago.  Marland Kitchen Reflex, gag, absent   . Reflex, gag, absent     ROS:   All systems reviewed and negative except as noted in the HPI.   Past Surgical History:  Procedure Laterality Date  . A-FLUTTER ABLATION N/A 08/16/2016   Procedure: A-Flutter Ablation;  Surgeon: Evans Lance, MD;  Location: [facility];  Service: Cardiovascular;  Laterality: N/A;  . ANTERIOR CERVICAL DECOMP/DISCECTOMY FUSION     3 levels, per family  . APPENDECTOMY  01-25-11  . CARPAL TUNNEL RELEASE  01-25-11   Bil.  . CERVICAL SPINE SURGERY  01-25-11   2005-multiple levels  . CHOLECYSTECTOMY  01-25-11   '07  . COLONOSCOPY WITH PROPOFOL N/A 07/23/2012   Procedure: COLONOSCOPY WITH PROPOFOL;  Surgeon: Garlan Fair, MD;  Location: [facility];  Service: Endoscopy;  Laterality: N/A;  . detatched retna Bilateral    done July & August- 2019  . EYE SURGERY Bilateral 2019   repair of retina detachment- Dr. Zadie Rhine at surgical center   . JOINT REPLACEMENT  01-25-11   6'03 LTHA-hemi  . KNEE ARTHROPLASTY  01-25-11   '04-right, revised x2  . no gag reflex     Pt does not havea gag reflex following  siurgery to neck. Family unsure of date. Pt takes pill in apple sauce.  . SPINAL CORD STIMULATOR BATTERY EXCHANGE N/A 01/12/2018   Procedure: Spinal cord stimulator battery replacement;  Surgeon: Clydell Hakim, MD;  Location: [facility];  Service: Neurosurgery;  Laterality: N/A;  left  . SPINAL CORD STIMULATOR IMPLANT  2009 APPROX   DR. ELSNER  . SPINAL CORD STIMULATOR REMOVAL N/A 06/04/2018   Procedure: Removal of spinal cord stimulator, thoracic paddle and generator;  Surgeon: Kristeen Miss, MD;  Location: [facility];  Service: Neurosurgery;  Laterality: N/A;  . THORACIC SPINE SURGERY  01-25-11   6'02- then nerve stimulator implanted after  . TOTAL KNEE REVISION  02/01/2011   Procedure: TOTAL KNEE REVISION;  Surgeon: Mauri Pole;  Location: [facility];  Service: Orthopedics;  Laterality: Right;  . TUBAL LIGATION       Family  History  Problem Relation Age of Onset  . Heart attack Father   . Emphysema Father   . Aneurysm Mother        CEREBRAL  . Emphysema Mother   . Dementia Mother   . Diabetes Son   . Hypertension Son   . Hypertension Son   . Cancer Sister        Widely Metastatic  . Asthma Son        x2  . Breast cancer Paternal Aunt   . Rheum arthritis Maternal Aunt      Social History  Socioeconomic History  . Marital status: Married    Spouse name: Not on file  . Number of children: 2  . Years of education: Not on file  . Highest education level: Not on file  Occupational History  . Occupation: disabled  Tobacco Use  . Smoking status: Former Smoker    Packs/day: 0.75    Years: 50.00    Pack years: 37.50    Types: Cigarettes    Start date: 09/13/1961    Quit date: 05/2016    Years since quitting: 3.9  . Smokeless tobacco: Never Used  . Tobacco comment: Decreased Down to 2/3 a Day if Thinking Abouit It  Vaping Use  . Vaping Use: Never used  Substance and Sexual Activity  . Alcohol use: No    Alcohol/week: 0.0 standard drinks    Comment: none in 10 years  . Drug use: No  . Sexual activity: Never  Other Topics Concern  . Not on file  Social History Narrative   She is from [facility]. She has always lived in [place]. She has traveled to [place], [place], [place], [place], & [place]. 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 Prior exposure to parakeets with last exposure remote in her current home. Previously had mold in her home that was in her bathroom & fixed. Prior exposure to hot tubs but none recently. Pt lives at home with [name], husband.  Has 2 childrean, 12th grade education.     Social Determinants of Health   Financial Resource Strain: Low Risk   . Difficulty of Paying Living Expenses: Not hard at all  Food Insecurity: No Food Insecurity  . Worried About Charity fundraiser in the Last  Year: Never true  . Ran Out of Food in the Last Year: Never true  Transportation Needs: No Transportation Needs  . Lack of Transportation (Medical): No  . Lack of Transportation (Non-Medical): No  Physical Activity: Inactive  . Days of Exercise per Week: 0 days  . Minutes of Exercise per Session: 0 min  Stress: No Stress Concern Present  . Feeling of Stress : Not at all  Social Connections: Moderately Isolated  . Frequency of Communication with Friends and Family: More than three times a week  . Frequency of Social Gatherings with Friends and Family: Twice a week  . Attends Religious Services: Never  . Active Member of Clubs or Organizations: No  . Attends Archivist Meetings: Never  . Marital Status: Married  Human resources officer Violence: Not At Risk  . Fear of Current or Ex-Partner: No  . Emotionally Abused: No  . Physically Abused: No  . Sexually Abused: No     BP 128/72   Pulse 65   Ht 5\' 5"  (1.651 m)   Wt 179 lb 12.8 oz (81.6 kg)   SpO2 91%   BMI 29.92 kg/m   Physical Exam:  Well appearing NAD HEENT: Unremarkable Neck:  No JVD, no thyromegally Lymphatics:  No adenopathy Back:  No CVA tenderness Lungs:  Clear with no wheezes HEART:  Regular rate rhythm, no murmurs, no rubs, no clicks Abd:  soft, positive bowel sounds, no organomegally, no rebound, no guarding Ext:  2 plus pulses, no edema, no cyanosis, no clubbing Skin:  No rashes no nodules Neuro:  CN II through XII intact, motor grossly intact  EKG - nsr   Assess/Plan: 1. Atrial  flutter/SVT - she is s/p ablation and has not had any additional arrhythmias. She will undergo watchful waiting. 2. HTN - her bp is well controlled. No change in meds.  Carleene Overlie Taylor,MD

## 2020-05-16 ENCOUNTER — Other Ambulatory Visit: Payer: Self-pay | Admitting: Emergency Medicine

## 2020-05-18 ENCOUNTER — Ambulatory Visit (INDEPENDENT_AMBULATORY_CARE_PROVIDER_SITE_OTHER): Payer: Medicare Other | Admitting: Emergency Medicine

## 2020-05-18 ENCOUNTER — Other Ambulatory Visit: Payer: Self-pay

## 2020-05-18 ENCOUNTER — Encounter: Payer: Self-pay | Admitting: Emergency Medicine

## 2020-05-18 DIAGNOSIS — J310 Chronic rhinitis: Secondary | ICD-10-CM

## 2020-05-18 DIAGNOSIS — Z720 Tobacco use: Secondary | ICD-10-CM

## 2020-05-18 DIAGNOSIS — J449 Chronic obstructive pulmonary disease, unspecified: Secondary | ICD-10-CM | POA: Diagnosis not present

## 2020-05-18 NOTE — Patient Instructions (Signed)
Please stay on Stiolto 2 puffs once daily for now. We will work on [employer] cares paperwork so that we can continue to get Stiolto if we decide to stay on this. Keep albuterol available use 2 puffs when needed for shortness of breath, chest tightness, wheezing. I would like to know which nebulizer medication you have at home.  Please call and leave a message with the nurses to let me know. At your next visit please bring all of your inhaler medication, all of your nose sprays so that we can review together. Your CT scan of the chest was stable.  You need another one in February 2023 for lung cancer screening. We will perform a walking oximetry at next office visit Follow with Dr. [name] in 2 months or sooner if you have any problems.

## 2020-05-18 NOTE — Progress Notes (Addendum)
Subjective:    Patient ID: Destiny Hughes, female    DOB: 04/09/1945, 75 y.o.   MRN: [id number]  HPI  [name] 06/03/19 --[name] is 17 and has a history of very severe COPD with emphysema.  She also has dementia, GERD, depression, hyperlipidemia, atrial fibrillation, chronic pain with a nerve stimulator.  She has been tried on many bronchodilators but cost and effectiveness have been problematic.  Last time we left her on Stiolto as she had some financial assistance for this.  She and her husband are present and both contribute to the history, they report today that she is on Stiolto but is unsure whether she will get refills from [facility] cares. She uses a nebulizer, but she isn't sure what is in it.   [name] 07/08/19 --75 year old woman with severe COPD and emphysema, dementia, GERD, depression, atrial fibrillation, chronic pain with a nerve stimulator.  We have had a hard time getting her on a stable bronchodilator regimen both due to intolerance of medications, inability to describe clinical response, poor understanding and ability to report back to regimen.  It looks like since last visit we have been able to confirm that she has BI Cares coverage for [employer].  Last time because it was unclear exactly what she is taking I tried starting her on Brovana twice a day. She is unsure which other nebs she is on. She doesn't know whether she ever got the [place] or not. She is using some neb 2x a day. COVID shots up to date.   [name] 05/18/20 --75 year old woman with severe COPD and emphysema.  She also has dementia, GERD, depression, A. fib, chronic pain.  Has been very difficult to get her on a good bronchodilator regimen.  She got financial assistance for [employer] through 2021, likely needs to be renewed if we are going to stay on this. She is still on it - is not sure that it is helping her very much. She coughs every day, dry. She has nasal congestion - started benadryl, off loratadine or Flonase, Atrovent  nasal spray - didn't seem to help her. She uses Proair. She no longer smokes.   Lung cancer screening CT done 04/22/2020 reviewed by me, showed a few scattered small pulmonary nodules with an irregular bandlike nodular opacity and peripheral right lower lobe 9 mm (stable to 2018).  This was a RADS 2 study, repeat in 12 months.   Review of Systems As above     Objective:   Physical Exam Vitals:   05/18/20 1022  BP: 122/78  Pulse: 63  Temp: (!) 97.4 F (36.3 C)  TempSrc: Temporal  SpO2: 96%  Weight: 190 lb 3.2 oz (86.3 kg)  Height: 5\' 6"  (1.676 m)   Gen: Pleasant, well-nourished, in no distress  ENT: No lesions,  mouth clear,  oropharynx clear, no postnasal drip  Neck: No JVD, no stridor  Lungs: No use of accessory muscles, decreased bilaterally, no wheezing, no crackles  Cardiovascular: RRR, heart sounds normal, no murmur or gallops, no peripheral edema  Musculoskeletal: No deformities, no cyanosis or clubbing  Neuro: alert, forgetful, some pressured speech.  Moves all extremities, no apparent focal deficits  Skin: Warm, no lesions or rash     Assessment & Plan:  COPD (chronic obstructive pulmonary disease) (HCC) Choosing a BD regimen has been difficult.  This is both due to cost and also because she is unsure how much she is benefiting.  Unclear to me how reliably she is taking.  She was most recently on Stiolto, apparently still has it and still uses it.  She needs renewal of her BI Cares support which ended January 1.  Please stay on Stiolto 2 puffs once daily for now. We will work on [employer] cares paperwork so that we can continue to get Stiolto if we decide to stay on this. Keep albuterol available use 2 puffs when needed for shortness of breath, chest tightness, wheezing. I would like to know which nebulizer medication you have at home.  Please call and leave a message with the nurses to let me know. At your next visit please bring all of your inhaler medication, all of  your nose sprays so that we can review together. Follow with Dr. [name] in 2 months or sooner if you have any problems.   Chronic rhinitis She is no longer using loratadine, Atrovent nasal spray or Flonase.  She is on Benadryl.  She stopped the other regimen because he was not sure it was helping her.  She still has nasal congestion.  She still coughs every day.  Again is difficult to get her on a stable medical routine  Tobacco abuse She reports that she stopped smoking.  I congratulated this.  She is due for repeat cancer screening CT February 2023.   Addendum: Ms. Pranger is being evaluated for orthopedic surgery.  She has severe obstruction by pulmonary function testing, is now on bronchodilator therapy.  Her COPD places her at moderate to high risk for general anesthesia and surgery but does not preclude an operation if the benefits outweigh the risks.  No barriers to proceeding as long as she is not experiencing acute dyspnea, acute wheezing, or any other signs of a COPD flare.  Baltazar Apo, MD, PhD 07/29/2020, 11:49 AM [facility] [phone] or if no answer before 7:00PM call [phone] For any issues after 7:00PM please call eLink [phone]

## 2020-05-19 ENCOUNTER — Telehealth (INDEPENDENT_AMBULATORY_CARE_PROVIDER_SITE_OTHER): Payer: Medicare Other | Admitting: Psychiatry

## 2020-05-19 DIAGNOSIS — F325 Major depressive disorder, single episode, in full remission: Secondary | ICD-10-CM

## 2020-05-19 MED ORDER — DONEPEZIL HCL 23 MG PO TABS: 10.0000 mg | ORAL_TABLET | Freq: Every day | ORAL | 5 refills | Status: DC

## 2020-05-19 MED ORDER — BUPROPION HCL ER (XL) 150 MG PO TB24: 300.0000 mg | ORAL_TABLET | ORAL | 6 refills | Status: DC

## 2020-05-19 MED ORDER — TEMAZEPAM 30 MG PO CAPS: 30.0000 mg | ORAL_CAPSULE | Freq: Every day | ORAL | 5 refills | Status: DC

## 2020-05-19 NOTE — Progress Notes (Signed)
Psychiatric Initial Adult Assessment   Patient Identification: Destiny Hughes MRN:  [id number] Date of Evaluation:  05/19/2020 Referral Source: [facility] Chief Complaint: Major depression remission  Today the patient is doing relatively well.  She was interviewed with her husband [name].  The patient denies being depressed.  She is not anxious.  She has no evidence of psychosis.  Her only complaint is that her memory continues to fail her.  She takes 10 mg of Aricept.  She can tell her memory is getting worse.  The patient is still able to enjoy things in her life.  Her husband is very supportive.  There is no evidence of alcohol or drug use.  Patient stays active as best she can.  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  Now she does not abuse things.  Her husband does all the driving and they go out to eat for almost every meal.  Patient does very little.  She is not agitated at all.  She seems to be very pleasant.  She has no irritability.  Patient medically is followed closely by her primary care doctor and seems to be doing fairly well.  She is very compliant with her medications.  With the assistance of her husband she functions well.  She does all her own basic ADLs by herself.  She is alert and lucid.  Her only complaint is she can tell me her memory is failing.   (Hypo) Manic Symptoms:   Anxiety Symptoms:   Psychotic Symptoms:   PTSD Symptoms:   Past Psychiatric History: none  Previous Psychotropic Medications: Yes   Substance Abuse History in the last 12 months:  No.  Consequences of Substance Abuse:   Past Medical History:  Past Medical History:  Diagnosis Date  . Arthritis    OA- knees & back  neck  . Chronic back pain   . Cognitive retention disorder   . Collapse of right lung   . COPD (chronic obstructive pulmonary disease) (HCC)    Hx. Bronchitis occ, 01-25-11 somes issue now taking  Z-pak-started  01-24-11/Scar tissue  present  from previous lung collapse  . Dementia with psychosis ([place])   . Depression   . Depression   . Dyspnea   . Dysrhythmia 2018   treated with Ablation   . GERD (gastroesophageal reflux disease)    tx. TUMS  . Glaucoma   . Headache   . History of kidney stones    surgically treated  . Hyperlipemia   . Hypertension   . Hypothyroidism    tx. Levothyroxine  . Major depression with psychotic features ([place])   . Nephrolithiasis   . Neuromuscular disorder ([place]) 01-25-11   Pain/nerve stimulator implanted -2 yrs ago.  Marland Kitchen Reflex, gag, absent   . Reflex, gag, absent     Past Surgical History:  Procedure Laterality Date  . A-FLUTTER ABLATION N/A 08/16/2016   Procedure: A-Flutter Ablation;  Surgeon: Evans Lance, MD;  Location: [facility];  Service: Cardiovascular;  Laterality: N/A;  . ANTERIOR CERVICAL DECOMP/DISCECTOMY FUSION     3 levels, per family  . APPENDECTOMY  01-25-11  . CARPAL TUNNEL RELEASE  01-25-11   [facility].  . CERVICAL SPINE SURGERY  01-25-11   2005-multiple levels  . CHOLECYSTECTOMY  01-25-11   '07  . COLONOSCOPY WITH PROPOFOL N/A 07/23/2012   Procedure: COLONOSCOPY WITH PROPOFOL;  Surgeon: Garlan Fair, MD;  [facility];  Service: Endoscopy;  Laterality: N/A;  . detatched retna Bilateral    done July & August- 2019  . EYE SURGERY Bilateral 2019   repair of retina detachment- Dr. Zadie Rhine at surgical center   . JOINT REPLACEMENT  01-25-11   6'03 LTHA-hemi  . KNEE ARTHROPLASTY  01-25-11   '04-right, revised x2  . no gag reflex     Pt does not havea gag reflex following  siurgery to neck. Family unsure of date. Pt takes pill in apple sauce.  . SPINAL CORD STIMULATOR BATTERY EXCHANGE N/A 01/12/2018   Procedure: Spinal cord stimulator battery replacement;  Surgeon: Clydell Hakim, MD;  Location: [facility];  Service: Neurosurgery;  Laterality: N/A;  left  . SPINAL CORD STIMULATOR IMPLANT  2009 APPROX   DR. ELSNER  . SPINAL CORD  STIMULATOR REMOVAL N/A 06/04/2018   Procedure: Removal of spinal cord stimulator, thoracic paddle and generator;  Surgeon: Kristeen Miss, MD;  Location: [facility];  Service: Neurosurgery;  Laterality: N/A;  . THORACIC SPINE SURGERY  01-25-11   6'02- then nerve stimulator implanted after  . TOTAL KNEE REVISION  02/01/2011   Procedure: TOTAL KNEE REVISION;  Surgeon: Mauri Pole;  Location: [facility];  Service: Orthopedics;  Laterality: Right;  . TUBAL LIGATION      Family Psychiatric History:   Family History:  Family History  Problem Relation Age of Onset  . Heart attack Father   . Emphysema Father   . Aneurysm Mother        CEREBRAL  . Emphysema Mother   . Dementia Mother   . Diabetes Son   . Hypertension Son   . Hypertension Son   . Cancer Sister        Widely Metastatic  . Asthma Son        x2  . Breast cancer Paternal Aunt   . Rheum arthritis Maternal Aunt     Social History:   Social History   Socioeconomic History  . Marital status: Married    Spouse name: Not on file  . Number of children: 2  . Years of education: Not on file  . Highest education level: Not on file  Occupational History  . Occupation: disabled  Tobacco Use  . Smoking status: Former Smoker    Packs/day: 0.75    Years: 50.00    Pack years: 37.50    Types: Cigarettes    Start date: 09/13/1961    Quit date: 05/2016    Years since quitting: 4.0  . Smokeless tobacco: Never Used  . Tobacco comment: Decreased Down to 2/3 a Day if Thinking [place]ouit It  Vaping Use  . Vaping Use: Never used  Substance and Sexual Activity  . Alcohol use: No    Alcohol/week: 0.0 standard drinks    Comment: none in 10 years  . Drug use: No  . Sexual activity: Never  Other Topics Concern  . Not on file  Social History Narrative   She is from [facility]. She has always lived in [place]. She has traveled to [place], [place], [place], [place], & [place]. 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 Prior exposure to parakeets with last exposure remote in her current home. Previously had mold in her home that was in her bathroom & fixed. Prior exposure to hot tubs but none recently. Pt lives at home with [name], husband.  Has 2 childrean, 12th grade education.     Social Determinants of Health   Financial Resource Strain: Low Risk   . Difficulty of Paying Living Expenses: Not hard at all  Food Insecurity: No Food Insecurity  . Worried About Charity fundraiser in the Last Year: Never true  . Ran Out of Food in the Last Year: Never true  Transportation Needs: No Transportation Needs  . Lack of Transportation (Medical): No  . Lack of Transportation (Non-Medical): No  Physical Activity: Inactive  . Days of Exercise per Week: 0 days  . Minutes of Exercise per Session: 0 min  Stress: No Stress Concern Present  . Feeling of Stress : Not at all  Social Connections: Moderately Isolated  . Frequency of Communication with Friends and Family: More than three times a week  . Frequency of Social Gatherings with Friends and Family: Twice a week  . Attends Religious Services: Never  . Active Member of Clubs or Organizations: No  . Attends Archivist Meetings: Never  . Marital Status: Married    Additional Social History:   Allergies:   Allergies  Allergen Reactions  . Albuterol Shortness Of Breath and Other (See Comments)  . Penicillins Anaphylaxis, Swelling and Other (See Comments)    Has patient had a PCN reaction causing immediate rash, facial/tongue/throat swelling, SOB or lightheadedness with hypotension: Yes Has patient had a PCN reaction causing severe rash involving mucus membranes or skin necrosis: Rash Has patient had a PCN reaction that required hospitalization: No Has patient had a PCN reaction occurring within the last 10 years: No If all of the above answers are "NO", then may proceed with Cephalosporin  use.  . Sulfa Antibiotics Swelling and Other (See Comments)    Throat swells, but no shortness of breath noted  . Codeine Nausea And Vomiting  . Ecotrin [Aspirin] Nausea And Vomiting  . Zanaflex [Tizanidine] Other (See Comments)    Possible confusion    Metabolic Disorder Labs: Lab Results  Component Value Date   HGBA1C 9.2 (H) 03/02/2020   MPG 217 03/02/2020   MPG 137 (H) 04/28/2015   No results found for: PROLACTIN Lab Results  Component Value Date   CHOL 338 (H) 03/02/2020   TRIG 397 (H) 03/02/2020   HDL 52 03/02/2020   CHOLHDL 6.5 (H) 03/02/2020   VLDL 52.8 (H) 02/21/2019   LDLCALC 219 (H) 03/02/2020   LDLCALC 114 04/28/2015     Current Medications: Current Outpatient Medications  Medication Sig Dispense Refill  . amLODipine (NORVASC) 5 MG tablet Take 1 tablet (5 mg total) by mouth daily. 90 tablet 3  . buPROPion (WELLBUTRIN XL) 150 MG 24 hr tablet Take 2 tablets (300 mg total) by mouth every morning. 60 tablet 6  . diphenhydramine-acetaminophen (TYLENOL PM) 25-500 MG TABS tablet Take 1 tablet by mouth at bedtime as needed.    . donepezil (ARICEPT) 23 MG TABS tablet Take 0.5 tablets (11.5 mg total) by mouth at bedtime. 30 tablet 5  . fluticasone (FLONASE) 50 MCG/ACT nasal spray 1 OR 2 SPRAYS IN NOSE ONCE A DAY 16 g 5  . gabapentin (NEURONTIN) 100 MG capsule Take 1 capsule (100 mg total) by mouth 2 (two) times daily. 180 capsule 3  . ipratropium (ATROVENT) 0.03 % nasal spray PLACE 2 SPRAYS INTO BOTH NOSTRILS EVERY 12 HOURS 30 mL 2  . levothyroxine (SYNTHROID) 125 MCG tablet TAKE ONE TABLET AT BEDTIME 90 tablet 0  . lisinopril (ZESTRIL) 5 MG tablet Take  1 tablet (5 mg total) by mouth daily. 90 tablet 3  . loratadine (CLARITIN) 10 MG tablet Take 10 mg by mouth daily. (Patient not taking: Reported on 05/18/2020)    . melatonin 5 MG TABS Take 5 mg by mouth at bedtime.    . metFORMIN (GLUCOPHAGE) 500 MG tablet Take 1 tablet (500 mg total) by mouth daily with breakfast. 30  tablet 3  . Misc Natural Products (JOINT HEALTH PO) Take by mouth.    . pantoprazole (PROTONIX) 40 MG tablet TAKE ONE TABLET DAILY 90 tablet 0  . propranolol (INDERAL) 80 MG tablet Take 0.5 tablets (40 mg total) by mouth 3 (three) times daily. 135 tablet 3  . rosuvastatin (CRESTOR) 5 MG tablet Take 1 tablet (5 mg total) by mouth daily. 30 tablet 3  . sodium chloride 1 g tablet TAKE ONE TABLET THREE TIMES DAILY 180 tablet 0  . temazepam (RESTORIL) 30 MG capsule Take 1 capsule (30 mg total) by mouth at bedtime. 30 capsule 5  . Tiotropium Bromide-Olodaterol (STIOLTO RESPIMAT) 2.5-2.5 MCG/ACT AERS Inhale 2 puffs into the lungs daily. 12 g 3   No current facility-administered medications for this visit.    Neurologic: Headache: No Seizure: No Paresthesias:No  Musculoskeletal: Strength & Muscle Tone: decreased Gait & Station: unsteady Patient leans: N/A  Psychiatric Specialty Exam: ROS  There were no vitals taken for this visit.There is no height or weight on file to calculate BMI.  General Appearance: Casual  Eye Contact:  Good  Speech:  Clear and Coherent  Volume:  Normal  Mood:  NA  Affect:  depressed  Thought Process:  Goal Directed  Orientation:  Full (Time, Place, and Person)  Thought Content:  WDL  Suicidal Thoughts:  No  Homicidal Thoughts:  No  Memory:  Immediate;   Poor  Judgement:  Fair  Insight:  Lacking  Psychomotor Activity:  Normal  Concentration:    Recall:  Poor  Fund of Knowledge:Poor  Language: Fair  Akathisia:  No  Handed:  Right  AIMS (if indicated):   Assets:  Communication Skills  ADL's:  Impaired  Cognition: Impaired,  Moderate  Sleep:      Treatment Plan Summary: This patient's first problem is that of dementia with behavioral disturbances.  At this time she is no behavioral disturbances.  She is doing well.  Her dementia is probably progressing.  We will be giving her 10 mg of Aricept and today we are going to increase it to 23 mg daily.  Her  second problem is insomnia.  The patient takes Restoril and sleeps fairly well.  She only gets about 4 to 5 hours but she does not take naps.  She will continue taking the Restoril as prescribed.  Her third problem is that of clinical depression.  The patient takes Wellbutrin every morning.  She has no features of being depressed.  The patient was supposedly to have an appointment in person where we would perform a Mini-Mental status exam with her today.  Unfortunately that did not happen.  Today she was interviewed by the phone and on her next visit in 3 months hopefully will see her in the office.  At that time we will reassess her memory and her cognitive abilities.  The patient is functioning reasonably well she is not agitated and she shows no evidence of psychosis.

## 2020-05-21 ENCOUNTER — Encounter: Payer: Self-pay | Admitting: Emergency Medicine

## 2020-05-21 NOTE — Assessment & Plan Note (Signed)
Choosing a BD regimen has been difficult.  This is both due to cost and also because she is unsure how much she is benefiting.  Unclear to me how reliably she is taking.  She was most recently on Stiolto, apparently still has it and still uses it.  She needs renewal of her [employer] C[employer] support which ended January 1.  Please stay on Stiolto 2 puffs once daily for now. We will work on [employer] cares paperwork so that we can continue to get Stiolto if we decide to stay on this. Keep albuterol available use 2 puffs when needed for shortness of breath, chest tightness, wheezing. I would like to know which nebulizer medication you have at home.  Please call and leave a message with the nurses to let me know. At your next visit please bring all of your inhaler medication, all of your nose sprays so that we can review together. Follow with Dr. [name] in 2 months or sooner if you have any problems.

## 2020-05-21 NOTE — Assessment & Plan Note (Signed)
She reports that she stopped smoking.  I congratulated this.  She is due for repeat cancer screening CT February 2023.

## 2020-05-21 NOTE — Assessment & Plan Note (Signed)
She is no longer using loratadine, Atrovent nasal spray or Flonase.  She is on Benadryl.  She stopped the other regimen because he was not sure it was helping her.  She still has nasal congestion.  She still coughs every day.  Again is difficult to get her on a stable medical routine

## 2020-05-26 ENCOUNTER — Telehealth: Payer: Self-pay | Admitting: Emergency Medicine

## 2020-06-01 ENCOUNTER — Other Ambulatory Visit: Payer: Self-pay | Admitting: Family Medicine

## 2020-06-02 ENCOUNTER — Telehealth: Payer: Self-pay | Admitting: Family Medicine

## 2020-06-02 NOTE — Telephone Encounter (Signed)
Patient's husband [name] is requesting a refill on her sodium chloride.  Pharm[facility]: [name]   Please advise.

## 2020-06-02 NOTE — Telephone Encounter (Signed)
Refill sent to pharmacy.   

## 2020-06-02 NOTE — Telephone Encounter (Signed)
Nothing noted in message. Will close encounter.  

## 2020-06-04 ENCOUNTER — Emergency Department (HOSPITAL_COMMUNITY)
Admission: EM | Admit: 2020-06-04 | Discharge: 2020-06-04 | Disposition: A | Discharge status: Home / Self Care | Payer: Medicare Other | Attending: Emergency Medicine | Admitting: Emergency Medicine

## 2020-06-04 ENCOUNTER — Other Ambulatory Visit: Payer: Self-pay

## 2020-06-04 ENCOUNTER — Emergency Department (HOSPITAL_COMMUNITY): Payer: Medicare Other

## 2020-06-04 DIAGNOSIS — I5030 Unspecified diastolic (congestive) heart failure: Secondary | ICD-10-CM | POA: Insufficient documentation

## 2020-06-04 DIAGNOSIS — Z96659 Presence of unspecified artificial knee joint: Secondary | ICD-10-CM | POA: Insufficient documentation

## 2020-06-04 DIAGNOSIS — Z79899 Other long term (current) drug therapy: Secondary | ICD-10-CM | POA: Diagnosis not present

## 2020-06-04 DIAGNOSIS — R202 Paresthesia of skin: Secondary | ICD-10-CM | POA: Insufficient documentation

## 2020-06-04 DIAGNOSIS — J449 Chronic obstructive pulmonary disease, unspecified: Secondary | ICD-10-CM | POA: Diagnosis not present

## 2020-06-04 DIAGNOSIS — R7303 Prediabetes: Secondary | ICD-10-CM | POA: Diagnosis not present

## 2020-06-04 DIAGNOSIS — Z7984 Long term (current) use of oral hypoglycemic drugs: Secondary | ICD-10-CM | POA: Diagnosis not present

## 2020-06-04 DIAGNOSIS — G20 Parkinson's disease: Secondary | ICD-10-CM | POA: Insufficient documentation

## 2020-06-04 DIAGNOSIS — Z96642 Presence of left artificial hip joint: Secondary | ICD-10-CM | POA: Insufficient documentation

## 2020-06-04 DIAGNOSIS — Z87891 Personal history of nicotine dependence: Secondary | ICD-10-CM | POA: Diagnosis not present

## 2020-06-04 DIAGNOSIS — M1611 Unilateral primary osteoarthritis, right hip: Secondary | ICD-10-CM | POA: Diagnosis not present

## 2020-06-04 DIAGNOSIS — R410 Disorientation, unspecified: Secondary | ICD-10-CM | POA: Diagnosis not present

## 2020-06-04 DIAGNOSIS — X58XXXA Exposure to other specified factors, initial encounter: Secondary | ICD-10-CM | POA: Diagnosis not present

## 2020-06-04 DIAGNOSIS — F0280 Dementia in other diseases classified elsewhere without behavioral disturbance: Secondary | ICD-10-CM | POA: Insufficient documentation

## 2020-06-04 DIAGNOSIS — E039 Hypothyroidism, unspecified: Secondary | ICD-10-CM | POA: Insufficient documentation

## 2020-06-04 DIAGNOSIS — R404 Transient alteration of awareness: Secondary | ICD-10-CM | POA: Diagnosis not present

## 2020-06-04 DIAGNOSIS — M25551 Pain in right hip: Secondary | ICD-10-CM | POA: Diagnosis not present

## 2020-06-04 DIAGNOSIS — I110 Hypertensive heart disease with heart failure: Secondary | ICD-10-CM | POA: Diagnosis not present

## 2020-06-04 MED ORDER — OXYCODONE-ACETAMINOPHEN 5-325 MG PO TABS
1.0000 | ORAL_TABLET | Freq: Once | ORAL | Status: AC
  Administered 2020-06-04: 1 via ORAL
  Filled 2020-06-04: qty 1

## 2020-06-04 MED ORDER — HYDROCODONE-ACETAMINOPHEN 5-325 MG PO TABS: 1.0000 | ORAL_TABLET | Freq: Three times a day (TID) | ORAL | 0 refills | Status: DC | PRN

## 2020-06-04 MED ORDER — DIAZEPAM 5 MG/ML IJ SOLN
2.5000 mg | Freq: Once | INTRAMUSCULAR | Status: AC
  Administered 2020-06-04: 2.5 mg via INTRAMUSCULAR
  Filled 2020-06-04: qty 2

## 2020-06-04 NOTE — ED Provider Notes (Signed)
I provided a substantive portion of the care of this patient.  I personally performed the entirety of the history for this encounter.    Patient has pre-existing severe degenerative disease in her right hip and knee.  Patient ports was trying to sit down she got sudden pain and feeling of instability in her right hip.  She did not fall to the ground.  She reports is very painful now to move or walk.  Patient is alert nontoxic.  Clear mental status.  No deformity at the hip.  Some pain with range of motion.  Old surgical scars over right knee.  No distal edema.  Foot is warm and dry with 2+ pedal pulse.  I agree with plan of management.   Charlesetta Shanks, MD 06/04/20 1058

## 2020-06-04 NOTE — ED Triage Notes (Signed)
Pt arrives from home BIB [facility] after attempting to sit down on the couch and felt like her R hip popped out of place. Pt denies any recent falls or trauma to the area. Per EMS pt was ambulatory and able to bear weight on scene. Pt is complaining of pain to the R hip stating it is throbbing from hip down to toes. VSS per EMS.

## 2020-06-04 NOTE — Discharge Instructions (Signed)
You can take 1000 mg of Tylenol.  Do not exceed 4000 mg of Tylenol a day.  Take pain medications as directed for break through pain. Do not drive or operate machinery while taking this medication. Do not take it as the same time as temazepam.   Follow up with your orthopedic doctor.   Return ot the Emergency Dept for any worsening pain, numbness/weakness, discoloration or any other worsening or concerning sypmtoms.

## 2020-06-04 NOTE — ED Provider Notes (Signed)
[facility] Provider Note   CSN: [id number] Arrival date & time: 06/04/20  0915     History Chief Complaint  Patient presents with  . Hip Pain    Destiny Hughes is a 75 y.o. female with PMH/o dementia, parkinson's GERD, hypertension, HLD BIB EMS for evaluation of right hip pain.  She reports she has a history of hip issues and states he has arthritis in her hip.  She states that she has had her left hip replacements and now her right hip hurts most the time.  She reports she was trying to sit down and states when she did, she felt pain in her right hip and fell back against the couch.  She states she did not fall on the ground.  Since then, she has had pain and difficulty walking.  She does sometimes use a cane to walk because of her pain.  She reports that she has some tingling around her foot.  She reports that the pain is worse when she tries to lift it up.  The history is provided by the patient.       Past Medical History:  Diagnosis Date  . Arthritis    OA- knees & back  neck  . Chronic back pain   . Cognitive retention disorder   . Collapse of right lung   . COPD (chronic obstructive pulmonary disease) (HCC)    Hx. Bronchitis occ, 01-25-11 somes issue now taking  Z-pak-started 01-24-11/Scar tissue  present  from previous lung collapse  . Dementia with psychosis ([place])   . Depression   . Depression   . Dyspnea   . Dysrhythmia 2018   treated with Ablation   . GERD (gastroesophageal reflux disease)    tx. TUMS  . Glaucoma   . Headache   . History of kidney stones    surgically treated  . Hyperlipemia   . Hypertension   . Hypothyroidism    tx. Levothyroxine  . Major depression with psychotic features ([place])   . Nephrolithiasis   . Neuromuscular disorder ([place]) 01-25-11   Pain/nerve stimulator implanted -2 yrs ago.  Marland Kitchen Reflex, gag, absent   . Reflex, gag, absent     Patient Active Problem List   Diagnosis Date Noted  .  Unilateral primary osteoarthritis, right hip 03/30/2020  . Unilateral primary osteoarthritis, left knee 03/30/2020  . History of fusion of cervical spine 07/03/2019  . Spinal cord stimulator dysfunction ([place]) 06/04/2018  . Chronic cough 05/21/2018  . Tremor 02/16/2018  . Chronic rhinitis 02/05/2018  . Hallucinations   . Psychosis ([place]) 11/07/2016  . Acute respiratory failure with hypoxia and hypercapnia ([place]) 11/03/2016  . Other chest pain 10/23/2016  . Tobacco abuse 10/23/2016  . Major depression with psychotic features ([place]) 10/19/2016  . Dementia with behavioral disturbance ([place]) 10/19/2016  . Depression due to physical illness   . Cognitive retention disorder   . Atrial flutter ([place]) 08/15/2016  . Paroxysmal atrial fibrillation ([place]) 08/15/2016  . SVT (supraventricular tachycardia) ([place]) 07/11/2016  . Weight loss 07/08/2016  . AKI (acute kidney injury) ([place]) 07/02/2016  . Acute kidney injury ([place]) 07/01/2016  . Hyperglycemia 07/01/2016  . Hyponatremia 07/01/2016  . Intractable nausea and vomiting 07/01/2016  . Abnormal EKG 07/01/2016  . Diastolic CHF ([place]) 99/24/2683  . Medication management 04/28/2015  . Emphysema lung ([place]) 12/26/2014  . Gastroesophageal reflux disease without esophagitis 12/26/2014  . Chronic bronchitis ([place]) 11/13/2014  . COPD (chronic obstructive pulmonary disease) ([place])  10/03/2014  . At high risk for falls 10/03/2014  . Other specified hypothyroidism 10/03/2014  . Mitral valve prolapse 10/03/2014  . Encounter for Medicare annual wellness exam 10/03/2014  . IBS (irritable bowel syndrome) 04/01/2014  . S/P total knee replacement 04/22/2013  . Knee pain, chronic 04/22/2013  . Essential hypertension 04/15/2013  . Vitamin D deficiency 04/15/2013  . Encounter for long-term (current) use of medications 04/15/2013  . Prediabetes 04/15/2013  . Hyperlipemia   . Glaucoma   . Chronic pain disorder   . Chronic back pain     Past Surgical History:   Procedure Laterality Date  . A-FLUTTER ABLATION N/A 08/16/2016   Procedure: A-Flutter Ablation;  Surgeon: Evans Lance, MD;  Location: [facility];  Service: Cardiovascular;  Laterality: N/A;  . ANTERIOR CERVICAL DECOMP/DISCECTOMY FUSION     3 levels, per family  . APPENDECTOMY  01-25-11  . CARPAL TUNNEL RELEASE  01-25-11   [facility].  . CERVICAL SPINE SURGERY  01-25-11   2005-multiple levels  . CHOLECYSTECTOMY  01-25-11   '07  . COLONOSCOPY WITH PROPOFOL N/A 07/23/2012   Procedure: COLONOSCOPY WITH PROPOFOL;  Surgeon: Garlan Fair, MD;  Location: [facility];  Service: Endoscopy;  Laterality: N/A;  . detatched retna Bilateral    done July & August- 2019  . EYE SURGERY Bilateral 2019   repair of retina detachment- Dr. Zadie Rhine at surgical center   . JOINT REPLACEMENT  01-25-11   6'03 LTHA-hemi  . KNEE ARTHROPLASTY  01-25-11   '04-right, revised x2  . no gag reflex     Pt does not havea gag reflex following  siurgery to neck. Family unsure of date. Pt takes pill in apple sauce.  . SPINAL CORD STIMULATOR BATTERY EXCHANGE N/A 01/12/2018   Procedure: Spinal cord stimulator battery replacement;  Surgeon: Clydell Hakim, MD;  Location: [facility];  Service: Neurosurgery;  Laterality: N/A;  left  . SPINAL CORD STIMULATOR IMPLANT  2009 APPROX   DR. ELSNER  . SPINAL CORD STIMULATOR REMOVAL N/A 06/04/2018   Procedure: Removal of spinal cord stimulator, thoracic paddle and generator;  Surgeon: Kristeen Miss, MD;  Location: [facility];  Service: Neurosurgery;  Laterality: N/A;  . THORACIC SPINE SURGERY  01-25-11   6'02- then nerve stimulator implanted after  . TOTAL KNEE REVISION  02/01/2011   Procedure: TOTAL KNEE REVISION;  Surgeon: Mauri Pole;  Location: [facility];  Service: Orthopedics;  Laterality: Right;  . TUBAL LIGATION       OB History   No obstetric history on file.     Family History  Problem Relation Age of Onset  . Heart attack Father   . Emphysema Father   . Aneurysm Mother         CEREBRAL  . Emphysema Mother   . Dementia Mother   . Diabetes Son   . Hypertension Son   . Hypertension Son   . Cancer Sister        Widely Metastatic  . Asthma Son        x2  . Breast cancer Paternal Aunt   . Rheum arthritis Maternal Aunt     Social History   Tobacco Use  . Smoking status: Former Smoker    Packs/day: 0.75    Years: 50.00    Pack years: 37.50    Types: Cigarettes    Start date: 09/13/1961    Quit date: 05/2016    Years since quitting: 4.0  . Smokeless tobacco: Never Used  .  Tobacco comment: Decreased Down to 2/3 a Day if Thinking [place]ou[place] It  Vaping Use  . Vaping Use: Never used  Substance Use Topics  . Alcohol use: No    Alcohol/week: 0.0 standard drinks    Comment: none in 10 years  . Drug use: No    Home Medications Prior to Admission medications   Medication Sig Start Date End Date Taking? Authorizing Provider  HYDROcodone-acetaminophen (NORCO/VICODIN) 5-325 MG tablet Take 1 tablet by mouth every 8 (eight) hours as needed. 06/04/20  Yes Providence Lanius A, PA-C  amLODipine (NORVASC) 5 MG tablet Take 1 tablet (5 mg total) by mouth daily. 05/12/20   Evans Lance, MD  buPROPion (WELLBUTRIN XL) 150 MG 24 hr tablet Take 2 tablets (300 mg total) by mouth every morning. 05/19/20 05/19/21  Plovsky, Berneta Sages, MD  diphenhydramine-acetaminophen (TYLENOL PM) 25-500 MG TABS tablet Take 1 tablet by mouth at bedtime as needed.    [provider]  donepezil (ARICEPT) 23 MG TABS tablet Take 0.5 tablets (11.5 mg total) by mouth at bedtime. 05/19/20   Plovsky, Berneta Sages, MD  fluticasone (FLONASE) 50 MCG/ACT nasal spray 1 OR 2 SPRAYS IN NOSE ONCE A DAY 06/28/18   Billie Ruddy, MD  gabapentin (NEURONTIN) 100 MG capsule Take 1 capsule (100 mg total) by mouth 2 (two) times daily. 09/30/19   Billie Ruddy, MD  ipratropium (ATROVENT) 0.03 % nasal spray PLACE 2 SPRAYS INTO BOTH NOSTRILS EVERY 12 HOURS 02/26/20   Collene Gobble, MD  levothyroxine (SYNTHROID) 125 MCG tablet  TAKE ONE TABLET AT BEDTIME 04/07/20   Billie Ruddy, MD  lisinopril (ZESTRIL) 5 MG tablet Take 1 tablet (5 mg total) by mouth daily. 05/12/20   Evans Lance, MD  loratadine (CLARITIN) 10 MG tablet Take 10 mg by mouth daily. Patient not taking: Reported on 05/18/2020    [provider]  melatonin 5 MG TABS Take 5 mg by mouth at bedtime.    [provider]  metFORMIN (GLUCOPHAGE) 500 MG tablet Take 1 tablet (500 mg total) by mouth daily with breakfast. 03/18/20   Billie Ruddy, MD  Misc Natural Products ([place]) Take by mouth.    [provider]  pantoprazole (PROTONIX) 40 MG tablet TAKE ONE TABLET DAILY 05/04/20   Billie Ruddy, MD  propranolol (INDERAL) 80 MG tablet Take 0.5 tablets (40 mg total) by mouth 3 (three) times daily. 05/12/20   Evans Lance, MD  rosuvastatin (CRESTOR) 5 MG tablet Take 1 tablet (5 mg total) by mouth daily. 03/18/20   Billie Ruddy, MD  sodium chloride 1 g tablet TAKE ONE TABLET THREE TIMES DAILY 06/02/20   Billie Ruddy, MD  temazepam (RESTORIL) 30 MG capsule Take 1 capsule (30 mg total) by mouth at bedtime. 05/19/20   Plovsky, Berneta Sages, MD  Tiotropium Bromide-Olodaterol (STIOLTO RESPIMAT) 2.5-2.5 MCG/ACT AERS Inhale 2 puffs into the lungs daily. 03/04/19   Collene Gobble, MD    Allergies    Albuterol, Penicillins, Sulfa antibiotics, Codeine, Ecotrin [aspirin], and Zanaflex [tizanidine]  Review of Systems   Review of Systems  Musculoskeletal:       Right hip  Neurological: Positive for numbness. Negative for weakness.  All other systems reviewed and are negative.   Physical Exam Updated Vital Signs BP (!) 134/91 (BP Location: Right Arm)   Pulse (!) 59   Temp 98.6 F (37 C) (Oral)   Resp 16   Ht 5\' 6"  (1.676 m)  Wt 86 kg   SpO2 96%   BMI 30.60 kg/m   Physical Exam Vitals and nursing note reviewed.  Constitutional:      Appearance: She is well-developed.  HENT:     Head: Normocephalic and atraumatic.   Eyes:     General: No scleral icterus.       Right eye: No discharge.        Left eye: No discharge.     Conjunctiva/sclera: Conjunctivae normal.  Cardiovascular:     Pulses:          Dorsalis pedis pulses are 2+ on the right side and 2+ on the left side.  Pulmonary:     Effort: Pulmonary effort is normal.  Musculoskeletal:     Comments: Tenderness to palpation noted to right hip. No deformity or crepitus noted. Limited ROM secondary to pain.  No bony tenderness noted right knee, right tib-fib, right ankle.  No tenderness palpation noted to left lower extremity.  Full flexion/tension and internal and external rotation intact by difficulty.  Skin:    General: Skin is warm and dry.     Comments: Good distal cap refill. RLE is not dusky in appearance or cool to touch.  Neurological:     Mental Status: She is alert.  Psychiatric:        Speech: Speech normal.        Behavior: Behavior normal.     ED Results / Procedures / Treatments   Labs (all labs ordered are listed, but only abnormal results are displayed) Labs Reviewed - No data to display  EKG None  Radiology DG Hip Unilat W or Wo Pelvis 2-3 Views Right  Result Date: 06/04/2020 CLINICAL DATA:  Hip pain EXAM: DG HIP (WITH OR WITHOUT PELVIS) 2-3V RIGHT COMPARISON:  None. FINDINGS: Moderate degenerative changes in the right hip with joint space loss and spurring. Subchondral sclerosis. Prior left hip replacement. No acute bony abnormality. Specifically, no fracture, subluxation, or dislocation. IMPRESSION: Moderate degenerative changes in the right hip. Prior left hip replacement. No acute bony abnormality. Electronically Signed   By: Rolm Baptise M.D.   On: 06/04/2020 10:23    Procedures Procedures   Medications Ordered in ED Medications  oxyCODONE-acetaminophen (PERCOCET/ROXICET) 5-325 MG per tablet 1 tablet (1 tablet Oral Given 06/04/20 1034)  diazepam (VALIUM) injection 2.5 mg (2.5 mg Intramuscular Given 06/04/20 1119)     ED Course  I have reviewed the triage vital signs and the nursing notes.  Pertinent labs & imaging results that were available during my care of the patient were reviewed by me and considered in my medical decision making (see chart for details).    MDM Rules/Calculators/A&P                          75 y.o. F brought in by EMS for evaluation of hip pain.  Patient reports that she has history of arthritis in the hip.  She states that she was trying to sit down today and when she did, she felt a pain in her right hip.  She states she fell down against the couch.  She reports since then she has had pain, difficulty moving it secondary to pain.  She does report that she feels like it is tingling just a little bit would otherwise denies any weakness.  On initial ED arrival, she is afebrile, toxic appearing.  Vital signs are stable.  She does have tenderness noted to the right  hip.  Limited range of motion secondary to pain.  Patient with good distal pulses.  She does report some tingling sensation to her foot but otherwise has full sensation.  History/physical exam a concern for ischemic limb, septic arthritis.  Concern for continued hip pain from arthritis versus possible dislocation though I do not see any obvious deformity.  We will plan for x-rays.  There is moderate degenerative changes in the right her hip.  No acute bony abnormality.  Reevaluation.  Patient still having some pain.  She was able to ambulate to the bathroom but did report that it hurt.  She was able to bear weight on it.  She did not have her walker that she normally walks with.  At this time, I suspect this is ongoing pain from her arthritis that was likely exacerbated by sitting down today.  She is not currently on any medication to manage her arthritis.  She does follow with [facility] with [facility]opedics.  She reports he is told her she will likely need a hip release but they decided not done.  At this time, patient is hemodynamically  stable.  We will plan to put her on a short course of pain medication for her pain. She has an allergy to ASA nad therefore with hold on Mobic. Patient instructed follow-up with orthopedic doctor. At this time, patient exhibits no emergent life-threatening condition that require further evaluation in ED. Patient had ample opportunity for questions and discussion. All patient's questions were answered with full understanding. Strict return precautions discussed. Patient expresses understanding and agreement to plan.    Portions of this note were generated with [employer]. Dictation errors may occur despite best attempts at proofreading.   Final Clinical Impression(s) / ED Diagnoses Final diagnoses:  Right hip pain    Rx / DC Orders ED Discharge Orders         Ordered    HYDROcodone-acetaminophen (NORCO/VICODIN) 5-325 MG tablet  Every 8 hours PRN        06/04/20 1154           Desma Mcgregor 06/04/20 1410    Charlesetta Shanks, MD 06/05/20 [phone]

## 2020-06-05 ENCOUNTER — Telehealth: Payer: Self-pay | Admitting: Orthopaedic Surgery

## 2020-06-05 NOTE — Telephone Encounter (Signed)
Patient would like to go ahead with the surgery. Her call back number is [phone]

## 2020-06-08 ENCOUNTER — Other Ambulatory Visit: Payer: Self-pay | Admitting: Orthopaedic Surgery

## 2020-06-08 ENCOUNTER — Other Ambulatory Visit: Payer: Self-pay

## 2020-06-08 MED ORDER — AMLODIPINE BESYLATE 5 MG PO TABS: 5.0000 mg | ORAL_TABLET | Freq: Every day | ORAL | 3 refills | Status: DC

## 2020-06-08 MED ORDER — HYDROCODONE-ACETAMINOPHEN 5-325 MG PO TABS: 1.0000 | ORAL_TABLET | Freq: Three times a day (TID) | ORAL | 0 refills | Status: DC | PRN

## 2020-06-08 NOTE — Telephone Encounter (Signed)
Pts husband just called stating he got the days mixed up.  [facility] not till April 1st  He just wanted to call Dr. Lorin Mercy and let him know. But he said if it needed to be changed that was fine also

## 2020-06-08 NOTE — Telephone Encounter (Signed)
Please advise 

## 2020-06-08 NOTE — Telephone Encounter (Signed)
Pt called and would like a refill on her pain medication

## 2020-06-08 NOTE — Telephone Encounter (Signed)
I called has appt tomorrow.

## 2020-06-09 DIAGNOSIS — B079 Viral wart, unspecified: Secondary | ICD-10-CM | POA: Diagnosis not present

## 2020-06-09 DIAGNOSIS — D485 Neoplasm of uncertain behavior of skin: Secondary | ICD-10-CM | POA: Diagnosis not present

## 2020-06-12 ENCOUNTER — Telehealth: Payer: Self-pay | Admitting: Emergency Medicine

## 2020-06-12 MED ORDER — STIOLTO RESPIMAT 2.5-2.5 MCG/ACT IN AERS: 2.0000 | INHALATION_SPRAY | Freq: Every day | RESPIRATORY_TRACT | 0 refills | Status: DC

## 2020-06-12 MED ORDER — STIOLTO RESPIMAT 2.5-2.5 MCG/ACT IN AERS: 2.0000 | INHALATION_SPRAY | Freq: Every day | RESPIRATORY_TRACT | 3 refills | Status: DC

## 2020-06-12 NOTE — Telephone Encounter (Signed)
Forms/script faxed to [employer] as requested per patient assistance program.  2 samples of Stiolto placed at the front for patient/husband to pick up, husband aware.  Nothing further needed.

## 2020-06-12 NOTE — Telephone Encounter (Signed)
[employer] application filled out for renewal of Stiolto respimat inhaler.  Called and spoke with patient's husband, Destiny Hughes, listed on the [facility].  He states he received a letter in the mail from the manufacturer and they mailed back the information they requested, however, the letter states they need something from our office.  Advised him that we have to have them fill out the application again and have them come by to complete their portion and then have Dr. [name] complete his portion and send a prescription.  Dr. [name] is currently out of the office.    Teams message received from [place] regarding application for [employer].  She states the application was faxed on 06/02/20.   Called [employer] and advised that they need a valid script and pages 4 and 5 filled out.  Script printed out and signed by Wyn Quaker NP and forms signed by Aaron Edelman as well.

## 2020-06-16 ENCOUNTER — Telehealth: Payer: Self-pay

## 2020-06-16 ENCOUNTER — Other Ambulatory Visit: Payer: Self-pay | Admitting: Orthopaedic Surgery

## 2020-06-16 MED ORDER — HYDROCODONE-ACETAMINOPHEN 5-325 MG PO TABS: 1.0000 | ORAL_TABLET | Freq: Three times a day (TID) | ORAL | 0 refills | Status: DC | PRN

## 2020-06-16 NOTE — Telephone Encounter (Signed)
I called and advised. 

## 2020-06-16 NOTE — Telephone Encounter (Signed)
Please advise 

## 2020-06-16 NOTE — Telephone Encounter (Signed)
Patient called she is requesting rx refill for hydrocodone  call back:[phone]

## 2020-06-16 NOTE — Telephone Encounter (Signed)
[facility]. OK done thanks

## 2020-06-19 ENCOUNTER — Telehealth (HOSPITAL_COMMUNITY): Payer: Medicare Other | Admitting: Psychiatry

## 2020-06-19 ENCOUNTER — Ambulatory Visit (INDEPENDENT_AMBULATORY_CARE_PROVIDER_SITE_OTHER): Payer: Medicare Other | Admitting: Orthopaedic Surgery

## 2020-06-19 ENCOUNTER — Other Ambulatory Visit: Payer: Self-pay

## 2020-06-19 ENCOUNTER — Encounter: Payer: Self-pay | Admitting: Orthopaedic Surgery

## 2020-06-19 ENCOUNTER — Telehealth: Payer: Self-pay | Admitting: *Deleted

## 2020-06-19 VITALS — BP 136/84 | HR 67

## 2020-06-19 DIAGNOSIS — M1611 Unilateral primary osteoarthritis, right hip: Secondary | ICD-10-CM

## 2020-06-19 NOTE — Progress Notes (Signed)
Office Visit Note   Patient: Destiny Hughes           Date of Birth: [birth year]           MRN: [id number] Visit Date: 06/19/2020              Requested by: Billie Ruddy, MD [place],  [place] [address] PCP: Billie Ruddy, MD   Assessment & Plan: Visit Diagnoses:  1. Unilateral primary osteoarthritis, right hip     Plan: We discussed total hip arthroplasty for right hip.  Pain is progressing and ambulation potentials has decreased.  Husband has an L1 compression fracture and she might require skilled facility stay post total hip arthroplasty.  Procedure discussed, spinal anesthesia overnight stay in the hospital if she did well.  Home physical therapy 3 times a week with bath aide 3 times a week.  Risks of surgery including fracture discussed she needs to have her A1c below 7.5 in order to proceed with surgery.  She would require preoperative clearance.  Follow-Up Instructions: No follow-ups on file.   Orders:  No orders of the defined types were placed in this encounter.  No orders of the defined types were placed in this encounter.     Procedures: No procedures performed   Clinical Data: No additional findings.   Subjective: Chief Complaint  Patient presents with  . Right Hip - Pain    HPI 75 year old female returns with progressive right hip osteoarthritis.  Recent visit to the emergency room due to severe hip pain difficulty walking.  She has a walker she uses at home also a cane.  Pains in the groin she does have some numbness in her thigh.  Patient does have diabetes A1c 3 months ago was 9.2 prior to that it was in the 6.4-7.3 range.  Previous knee arthroplasty on the right persistently painful.  She has been in pain management in the past had a spinal cord stimulator placed later removed without improvement.  No lumbar fusions.  Previous bipolar left hip more than 10 years ago.  Patient's husband has compression fracture lumbar  difficulty ambulating and patient's had increased pain and decreased mobility.  Patient is requesting proceeding with right total hip arthroplasty.  Patient status PT atrial for been the past.  Previous heart failure.  Review of Systems's treatment of COPD past smoking, hypertension.  Back pain, painful total knee arthroplasty with revision surgery.  Left hip bipolar doing well.  Right hip progressive osteoarthritis.  History of heart failure positive for diabetes.  Negative for seizures negative for stroke.   Objective: Vital Signs: BP 136/84   Pulse 67   Physical Exam Constitutional:      Appearance: She is well-developed.  HENT:     Head: Normocephalic.     Right Ear: External ear normal.     Left Ear: External ear normal.  Eyes:     Pupils: Pupils are equal, round, and reactive to light.  Neck:     Thyroid: No thyromegaly.     Trachea: No tracheal deviation.  Cardiovascular:     Rate and Rhythm: Normal rate.  Pulmonary:     Effort: Pulmonary effort is normal.  Abdominal:     Palpations: Abdomen is soft.  Skin:    General: Skin is warm and dry.  Neurological:     Mental Status: She is alert and oriented to person, place, and time.  Psychiatric:        Behavior:  Behavior normal.     Ortho Exam patient has healed right total knee arthroplasty.  Good range of motion left hip without pain.  Right hip as 10 degrees internal rotation 30 degrees external rotation with sharp groin pain.  No sciatic notch tenderness negative for straight leg raising.  Knee reaches full extension flexes past 90 degrees.  Distal pulses palpable.  Pain with internal rotation external rotation right hip both in supine as well as sitting position present with distraction.  Specialty Comments:  No specialty comments available.  Imaging: No results found.   PMFS History: Patient Active Problem List   Diagnosis Date Noted  . Unilateral primary osteoarthritis, right hip 03/30/2020  . Unilateral  primary osteoarthritis, left knee 03/30/2020  . History of fusion of cervical spine 07/03/2019  . Spinal cord stimulator dysfunction ([place]) 06/04/2018  . Chronic cough 05/21/2018  . Tremor 02/16/2018  . Chronic rhinitis 02/05/2018  . Hallucinations   . Psychosis ([place]) 11/07/2016  . Acute respiratory failure with hypoxia and hypercapnia ([place]) 11/03/2016  . Other chest pain 10/23/2016  . Tobacco abuse 10/23/2016  . Major depression with psychotic features ([place]) 10/19/2016  . Dementia with behavioral disturbance ([place]) 10/19/2016  . Depression due to physical illness   . Cognitive retention disorder   . Atrial flutter ([place]) 08/15/2016  . Paroxysmal atrial fibrillation ([place]) 08/15/2016  . SVT (supraventricular tachycardia) ([place]) 07/11/2016  . Weight loss 07/08/2016  . AKI (acute kidney injury) ([place]) 07/02/2016  . Acute kidney injury ([place]) 07/01/2016  . Hyperglycemia 07/01/2016  . Hyponatremia 07/01/2016  . Intractable nausea and vomiting 07/01/2016  . Abnormal EKG 07/01/2016  . Diastolic CHF ([place]) 67/20/9470  . Medication management 04/28/2015  . Emphysema lung ([place]) 12/26/2014  . Gastroesophageal reflux disease without esophagitis 12/26/2014  . Chronic bronchitis ([place]) 11/13/2014  . COPD (chronic obstructive pulmonary disease) ([place]) 10/03/2014  . At high risk for falls 10/03/2014  . Other specified hypothyroidism 10/03/2014  . Mitral valve prolapse 10/03/2014  . Encounter for Medicare annual wellness exam 10/03/2014  . IBS (irritable bowel syndrome) 04/01/2014  . S/P total knee replacement 04/22/2013  . Knee pain, chronic 04/22/2013  . Essential hypertension 04/15/2013  . Vitamin D deficiency 04/15/2013  . Encounter for long-term (current) use of medications 04/15/2013  . Prediabetes 04/15/2013  . Hyperlipemia   . Glaucoma   . Chronic pain disorder   . Chronic back pain    Past Medical History:  Diagnosis Date  . Arthritis    OA- knees & back  neck  . Chronic back pain    . Cognitive retention disorder   . Collapse of right lung   . COPD (chronic obstructive pulmonary disease) (H[facility])    Hx. Bronchitis occ, 01-25-11 somes issue now taking  Z-pak-started 01-24-11/Scar tissue  present  from previous lung collapse  . Dementia with psychosis ([place])   . Depression   . Depression   . Dyspnea   . Dysrhythmia 2018   treated with Ablation   . GERD (gastroesophageal reflux disease)    tx. TUMS  . Glaucoma   . Headache   . History of kidney stones    surgically treated  . Hyperlipemia   . Hypertension   . Hypothyroidism    tx. Levothyroxine  . Major depression with psychotic features ([place])   . Nephrolithiasis   . Neuromuscular disorder ([place]) 01-25-11   Pain/nerve stimulator implanted -2 yrs ago.  Marland Kitchen Reflex, gag, absent   . Reflex, gag, absent  Family History  Problem Relation Age of Onset  . Heart attack Father   . Emphysema Father   . Aneurysm Mother        CEREBRAL  . Emphysema Mother   . Dementia Mother   . Diabetes Son   . Hypertension Son   . Hypertension Son   . Cancer Sister        Widely Metastatic  . Asthma Son        x2  . Breast cancer Paternal Aunt   . Rheum arthritis Maternal Aunt     Past Surgical History:  Procedure Laterality Date  . A-FLUTTER ABLATION N/A 08/16/2016   Procedure: A-Flutter Ablation;  Surgeon: Evans Lance, MD;  Location: [facility];  Service: Cardiovascular;  Laterality: N/A;  . ANTERIOR CERVICAL DECOMP/DISCECTOMY FUSION     3 levels, per family  . APPENDECTOMY  01-25-11  . CARPAL TUNNEL RELEASE  01-25-11   [facility].  . CERVICAL SPINE SURGERY  01-25-11   2005-multiple levels  . CHOLECYSTECTOMY  01-25-11   '07  . COLONOSCOPY WITH PROPOFOL N/A 07/23/2012   Procedure: COLONOSCOPY WITH PROPOFOL;  Surgeon: Garlan Fair, MD;  Location: [facility];  Service: Endoscopy;  Laterality: N/A;  . detatched retna Bilateral    done July & August- 2019  . EYE SURGERY Bilateral 2019   repair of retina detachment-  Dr. Zadie Rhine at surgical center   . JOINT REPLACEMENT  01-25-11   6'03 LTHA-hemi  . KNEE ARTHROPLASTY  01-25-11   '04-right, revised x2  . no gag reflex     Pt does not havea gag reflex following  siurgery to neck. Family unsure of date. Pt takes pill in apple sauce.  . SPINAL CORD STIMULATOR BATTERY EXCHANGE N/A 01/12/2018   Procedure: Spinal cord stimulator battery replacement;  Surgeon: Clydell Hakim, MD;  Location: [facility];  Service: Neurosurgery;  Laterality: N/A;  left  . SPINAL CORD STIMULATOR IMPLANT  2009 APPROX   DR. ELSNER  . SPINAL CORD STIMULATOR REMOVAL N/A 06/04/2018   Procedure: Removal of spinal cord stimulator, thoracic paddle and generator;  Surgeon: Kristeen Miss, MD;  Location: [facility];  Service: Neurosurgery;  Laterality: N/A;  . THORACIC SPINE SURGERY  01-25-11   6'02- then nerve stimulator implanted after  . TOTAL KNEE REVISION  02/01/2011   Procedure: TOTAL KNEE REVISION;  Surgeon: Mauri Pole;  Location: [facility];  Service: Orthopedics;  Laterality: Right;  . TUBAL LIGATION     Social History   Occupational History  . Occupation: disabled  Tobacco Use  . Smoking status: Former Smoker    Packs/day: 0.75    Years: 50.00    Pack years: 37.50    Types: Cigarettes    Start date: 09/13/1961    Quit date: 05/2016    Years since quitting: 4.0  . Smokeless tobacco: Never Used  . Tobacco comment: Decreased Down to 2/3 a Day if [place] It  Vaping Use  . Vaping Use: Never used  Substance and Sexual Activity  . Alcohol use: No    Alcohol/week: 0.0 standard drinks    Comment: none in 10 years  . Drug use: No  . Sexual activity: Never

## 2020-06-19 NOTE — Telephone Encounter (Signed)
   Primary Cardiologist: Cristopher Peru, MD  Chart reviewed as part of pre-operative protocol coverage. Given past medical history and time since last visit, based on ACC/AHA guidelines, Destiny Hughes would be at acceptable risk for the planned procedure without further cardiovascular testing.   Her RCRI is a class I risk, 0.4% risk of major cardiac event.  I will route this recommendation to the requesting party via Epic fax function and remove from pre-op pool.  Please call with questions.  Jossie Ng. Makenze Ellett NP-C    06/19/2020, 4:42 PM [facility] [place] [address] Office [phone] Fax [phone]

## 2020-06-19 NOTE — Telephone Encounter (Signed)
   [facility] Pre-operative Risk Assessment    Patient Name: Destiny Hughes  DOB: 29-Sep-1945  MRN: [id number]   HEARTCARE STAFF: - Please ensure there is not already an duplicate clearance open for this procedure. - Under Visit Info/Reason for Call, type in Other and utilize the format Clearance MM/DD/YY or Clearance TBD. Do not use dashes or single digits. - If request is for dental extraction, please clarify the # of teeth to be extracted.  Request for surgical clearance:  1. What type of surgery is being performed? RIGHT TOTAL HIP ARTHROPLASTY   2. When is this surgery scheduled? TBD   3. What type of clearance is required (medical clearance vs. Pharmacy clearance to hold med vs. Both)? MEDICAL  4. Are there any medications that need to be held prior to surgery and how long? NONE LISTED   5. Practice name and name of physician performing surgery? [facility]; DR. Blanchard   6. What is the office phone number? [phone]   7.   What is the office fax number? [name].   Anesthesia type (None, local, MAC, general) ? SPINAL   Julaine Hua 06/19/2020, 4:36 PM  _________________________________________________________________   (provider comments below)

## 2020-06-22 ENCOUNTER — Other Ambulatory Visit: Payer: Self-pay

## 2020-06-22 ENCOUNTER — Ambulatory Visit (INDEPENDENT_AMBULATORY_CARE_PROVIDER_SITE_OTHER): Payer: Medicare Other | Admitting: Family Medicine

## 2020-06-22 ENCOUNTER — Encounter: Payer: Self-pay | Admitting: Family Medicine

## 2020-06-22 VITALS — BP 150/72 | HR 74 | Temp 98.3°F | Wt 186.0 lb

## 2020-06-22 DIAGNOSIS — F0391 Unspecified dementia with behavioral disturbance: Secondary | ICD-10-CM | POA: Diagnosis not present

## 2020-06-22 DIAGNOSIS — I10 Essential (primary) hypertension: Secondary | ICD-10-CM | POA: Diagnosis not present

## 2020-06-22 DIAGNOSIS — E1169 Type 2 diabetes mellitus with other specified complication: Secondary | ICD-10-CM

## 2020-06-22 DIAGNOSIS — M1611 Unilateral primary osteoarthritis, right hip: Secondary | ICD-10-CM | POA: Diagnosis not present

## 2020-06-22 LAB — POCT GLYCOSYLATED HEMOGLOBIN (HGB A1C): Hemoglobin A1C: 7.9 % — AB (ref 4.0–5.6)

## 2020-06-22 MED ORDER — METFORMIN HCL 500 MG PO TABS: 500.0000 mg | ORAL_TABLET | Freq: Two times a day (BID) | ORAL | 1 refills | Status: DC

## 2020-06-22 NOTE — Patient Instructions (Addendum)
Your hemoglobin A1c today was 7.9%.  In order to have her surgery the surgeon would like it to be below 7.5%.  Today we discussed increasing your Metformin from 500 mg daily to 500 mg twice a day.  A new prescription for the twice a day dosing was sent to your pharmacy.  Work on increasing your intake of water.   Diabetes Mellitus and Nutrition, Adult When you have diabetes, or diabetes mellitus, it is very important to have healthy eating habits because your blood sugar (glucose) levels are greatly affected by what you eat and drink. Eating healthy foods in the right amounts, at about the same times every day, can help you:  Control your blood glucose.  Lower your risk of heart disease.  Improve your blood pressure.  Reach or maintain a healthy weight. What can affect my meal plan? Every person with diabetes is different, and each person has different needs for a meal plan. Your health care provider may recommend that you work with a dietitian to make a meal plan that is best for you. Your meal plan may vary depending on factors such as:  The calories you need.  The medicines you take.  Your weight.  Your blood glucose, blood pressure, and cholesterol levels.  Your activity level.  Other health conditions you have, such as heart or kidney disease. How do carbohydrates affect me? Carbohydrates, also called carbs, affect your blood glucose level more than any other type of food. Eating carbs naturally raises the amount of glucose in your blood. Carb counting is a method for keeping track of how many carbs you eat. Counting carbs is important to keep your blood glucose at a healthy level, especially if you use insulin or take certain oral diabetes medicines. It is important to know how many carbs you can safely have in each meal. This is different for every person. Your dietitian can help you calculate how many carbs you should have at each meal and for each snack. How does alcohol affect  me? Alcohol can cause a sudden decrease in blood glucose (hypoglycemia), especially if you use insulin or take certain oral diabetes medicines. Hypoglycemia can be a life-threatening condition. Symptoms of hypoglycemia, such as sleepiness, dizziness, and confusion, are similar to symptoms of having too much alcohol.  Do not drink alcohol if: ? Your health care provider tells you not to drink. ? You are pregnant, may be pregnant, or are planning to become pregnant.  If you drink alcohol: ? Do not drink on an empty stomach. ? Limit how much you use to:  0-1 drink a day for women.  0-2 drinks a day for men. ? Be aware of how much alcohol is in your drink. In the [place] one drink equals one 12 oz bottle of beer (355 mL), one 5 oz glass of wine (148 mL), or one 1 oz glass of hard liquor (44 mL). ? Keep yourself hydrated with water, diet soda, or unsweetened iced tea.  Keep in mind that regular soda, juice, and other mixers may contain a lot of sugar and must be counted as carbs. What are tips for following this plan? Reading food labels  Start by checking the serving size on the "Nutrition Facts" label of packaged foods and drinks. The amount of calories, carbs, fats, and other nutrients listed on the label is based on one serving of the item. Many items contain more than one serving per package.  Check the total grams (g) of carbs  in one serving. You can calculate the number of servings of carbs in one serving by dividing the total carbs by 15. For example, if a food has 30 g of total carbs per serving, it would be equal to 2 servings of carbs.  Check the number of grams (g) of saturated fats and trans fats in one serving. Choose foods that have a low amount or none of these fats.  Check the number of milligrams (mg) of salt (sodium) in one serving. Most people should limit total sodium intake to less than 2,300 mg per day.  Always check the nutrition information of foods labeled as  "low-fat" or "nonfat." These foods may be higher in added sugar or refined carbs and should be avoided.  Talk to your dietitian to identify your daily goals for nutrients listed on the label. Shopping  Avoid buying canned, pre-made, or processed foods. These foods tend to be high in fat, sodium, and added sugar.  Shop around the outside edge of the grocery store. This is where you will most often find fresh fruits and vegetables, bulk grains, fresh meats, and fresh dairy. Cooking  Use low-heat cooking methods, such as baking, instead of high-heat cooking methods like deep frying.  Cook using healthy oils, such as olive, canola, or sunflower oil.  Avoid cooking with butter, cream, or high-fat meats. Meal planning  Eat meals and snacks regularly, preferably at the same times every day. Avoid going long periods of time without eating.  Eat foods that are high in fiber, such as fresh fruits, vegetables, beans, and whole grains. Talk with your dietitian about how many servings of carbs you can eat at each meal.  Eat 4-6 oz (112-168 g) of lean protein each day, such as lean meat, chicken, fish, eggs, or tofu. One ounce (oz) of lean protein is equal to: ? 1 oz (28 g) of meat, chicken, or fish. ? 1 egg. ?  cup (62 g) of tofu.  Eat some foods each day that contain healthy fats, such as avocado, nuts, seeds, and fish.   What foods should I eat? Fruits Berries. Apples. Oranges. Peaches. Apricots. Plums. Grapes. Mango. Papaya. Pomegranate. Kiwi. Cherries. Vegetables Lettuce. Spinach. Leafy greens, including kale, chard, collard greens, and mustard greens. Beets. Cauliflower. Cabbage. Broccoli. Carrots. Green beans. Tomatoes. Peppers. Onions. Cucumbers. Brussels sprouts. Grains Whole grains, such as whole-wheat or whole-grain bread, crackers, tortillas, cereal, and pasta. Unsweetened oatmeal. Quinoa. Brown or wild rice. Meats and other proteins Seafood. Poultry without skin. Lean cuts of  poultry and beef. Tofu. Nuts. Seeds. Dairy Low-fat or fat-free dairy products such as milk, yogurt, and cheese. The items listed above may not be a complete list of foods and beverages you can eat. Contact a dietitian for more information. What foods should I avoid? Fruits Fruits canned with syrup. Vegetables Canned vegetables. Frozen vegetables with butter or cream sauce. Grains Refined white flour and flour products such as bread, pasta, snack foods, and cereals. Avoid all processed foods. Meats and other proteins Fatty cuts of meat. Poultry with skin. Breaded or fried meats. Processed meat. Avoid saturated fats. Dairy Full-fat yogurt, cheese, or milk. Beverages Sweetened drinks, such as soda or iced tea. The items listed above may not be a complete list of foods and beverages you should avoid. Contact a dietitian for more information. Questions to ask a health care provider  Do I need to meet with a diabetes educator?  Do I need to meet with a dietitian?  What number  can I call if I have questions?  When are the best times to check my blood glucose? Where to find more information:  American Diabetes Association: diabetes.org  Academy of Nutrition and Dietetics: www.eatright.[employer] of Diabetes and Digestive and Kidney Diseases: [employer]  Association of Diabetes Care and Education Specialists: www.diabeteseducator.org Summary  It is important to have healthy eating habits because your blood sugar (glucose) levels are greatly affected by what you eat and drink.  A healthy meal plan will help you control your blood glucose and maintain a healthy lifestyle.  Your health care provider may recommend that you work with a dietitian to make a meal plan that is best for you.  Keep in mind that carbohydrates (carbs) and alcohol have immediate effects on your blood glucose levels. It is important to count carbs and to use alcohol carefully. This  information is not intended to replace advice given to you by your health care provider. Make sure you discuss any questions you have with your health care provider. Document Revised: 02/12/2019 Document Reviewed: 02/12/2019 Elsevier Patient Education  2021 [employer].

## 2020-06-22 NOTE — Progress Notes (Signed)
Subjective:    Patient ID: Destiny Hughes, female    DOB: 07/03/1945, 75 y.o.   MRN: [id number]  No chief complaint on file. Patient accompanied by her friend.  HPI Patient is a 75 year old female with past medical history significant for dementia, folate, COPD, HTN, A-flutter, diastolic CHF, mitral valve prolapse, A. fib, hypothyroidism, IBS, seasonal allergies, HLD, glaucoma, chronic back pain TKR, DM 2, tobacco use who was seen today for Hgb A1C.  Pt called in this am stating she needed A1C for R THR surgery.  Pt seen by Cardiology on 06/19/20 for pre op risk assessment.  Per Ortho note, Dr. Lorin Mercy, pt would need to have Hgb A1C below 7.5% in order to proceed with surgery.  Hgb A1C was 9.2% on 03/02/20, up from 7.3%  Past Medical History:  Diagnosis Date  . Arthritis    OA- knees & back  neck  . Chronic back pain   . Cognitive retention disorder   . Collapse of right lung   . COPD (chronic obstructive pulmonary disease) (HCC)    Hx. Bronchitis occ, 01-25-11 somes issue now taking  Z-pak-started 01-24-11/Scar tissue  present  from previous lung collapse  . Dementia with psychosis ([place])   . Depression   . Depression   . Dyspnea   . Dysrhythmia 2018   treated with Ablation   . GERD (gastroesophageal reflux disease)    tx. TUMS  . Glaucoma   . Headache   . History of kidney stones    surgically treated  . Hyperlipemia   . Hypertension   . Hypothyroidism    tx. Levothyroxine  . Major depression with psychotic features ([place])   . Nephrolithiasis   . Neuromuscular disorder ([place]) 01-25-11   Pain/nerve stimulator implanted -2 yrs ago.  Marland Kitchen Reflex, gag, absent   . Reflex, gag, absent     Allergies  Allergen Reactions  . Albuterol Shortness Of Breath and Other (See Comments)  . Penicillins Anaphylaxis, Swelling and Other (See Comments)    Has patient had a PCN reaction causing immediate rash, facial/tongue/throat swelling, SOB or lightheadedness with hypotension: Yes Has patient had  a PCN reaction causing severe rash involving mucus membranes or skin necrosis: Rash Has patient had a PCN reaction that required hospitalization: No Has patient had a PCN reaction occurring within the last 10 years: No If all of the above answers are "NO", then may proceed with Cephalosporin use.  . Sulfa Antibiotics Swelling and Other (See Comments)    Throat swells, but no shortness of breath noted  . Codeine Nausea And Vomiting  . Ecotrin [Aspirin] Nausea And Vomiting  . Zanaflex [Tizanidine] Other (See Comments)    Possible confusion    ROS limited 2/2 dementia General: Denies fever, chills, night sweats, changes in weight, changes in appetite HEENT: Denies headaches, ear pain, changes in vision, rhinorrhea, sore throat CV: Denies CP, palpitations, SOB, orthopnea Pulm: Denies SOB, cough, wheezing GI: Denies abdominal pain, nausea, vomiting, diarrhea, constipation GU: Denies dysuria, hematuria, frequency, vaginal discharge Msk: Denies muscle cramps, joint pains  + right hip pain Neuro: Denies weakness, numbness, tingling Skin: Denies rashes, bruising Psych: Denies depression, anxiety, hallucinations    Objective:    Blood pressure (!) 150/72, pulse 74, temperature 98.3 F (36.8 C), temperature source Oral, weight 186 lb (84.4 kg), SpO2 94 %.  Gen. Pleasant, well-nourished, in no distress, normal affect   HEENT: [place]/AT, face symmetric, conjunctiva clear, no scleral icterus, PERRLA, EOMI, nares patent without drainage Lungs: no  accessory muscle use Cardiovascular: RRR, no peripheral edema Musculoskeletal: No deformities, no cyanosis or clubbing, normal tone Neuro:  A&Ox3, CN II-XII intact, ambulating with a cane Skin:  Warm, no lesions/ rash   Wt Readings from Last 3 Encounters:  06/22/20 186 lb (84.4 kg)  06/04/20 189 lb 9.5 oz (86 kg)  05/18/20 190 lb 3.2 oz (86.3 kg)    Lab Results  Component Value Date   WBC 5.9 03/02/2020   HGB 13.8 03/02/2020   HCT 41.5  03/02/2020   PLT 379 03/02/2020   GLUCOSE 175 (H) 03/02/2020   CHOL 338 (H) 03/02/2020   TRIG 397 (H) 03/02/2020   HDL 52 03/02/2020   LDLDIRECT 168.0 02/21/2019   LDLCALC 219 (H) 03/02/2020   ALT 15 03/02/2020   AST 14 03/02/2020   NA 130 (L) 03/02/2020   K 4.9 03/02/2020   CL 91 (L) 03/02/2020   CREATININE 1.05 (H) 03/02/2020   BUN 14 03/02/2020   CO2 28 03/02/2020   TSH 1.75 03/02/2020   INR 1.07 08/16/2016   HGBA1C 9.2 (H) 03/02/2020   MICROALBUR 0.4 05/22/2014    Assessment/Plan:  Type 2 diabetes mellitus with other specified complication, without long-term current use of insulin (HCC)  -Hemoglobin A1c 7.9% this visit.  Previously 9.8% in 03/02/2020 -Discussed lifestyle modifications -We will increase Metformin from 500 mg daily to 500 mg twice daily. -Patient encouraged to inform Ortho of A1c. - Plan: POCT glycosylated hemoglobin (Hb A1C), metFORMIN (GLUCOPHAGE) 500 MG tablet  Unilateral primary osteoarthritis, right hip -Continue follow-up with Ortho in regards to R THR -Advised per Ortho hemoglobin A1c to be less than 7.5% in order to proceed with surgery  Essential hypertension -Elevated -Continue lisinopril 5 mg,, and Norvasc 5 mg daily, propranolol 80 mg  Dementia with behavioral disturbance, unspecified type -Continue Aricept 11.5 mg nightly  F/u prn  Grier Mitts, MD

## 2020-06-23 ENCOUNTER — Telehealth: Payer: Self-pay | Admitting: Radiology

## 2020-06-23 ENCOUNTER — Telehealth: Payer: Self-pay

## 2020-06-23 NOTE — Telephone Encounter (Signed)
Patient requests return call from Dr. Lorin Mercy to discuss pain medication. [phone]

## 2020-06-23 NOTE — Telephone Encounter (Signed)
I called talk with patient and also husband.  They get an appointment to come up in the office where I can inject her hip with straight Marcaine and lidocaine.  If this does not help her pain then we need to obtain an MRI scan of the lumbar spine.

## 2020-06-23 NOTE — Telephone Encounter (Signed)
Patients husband [name] called he stated patient wanted to discuss pain medication options call back:[phone]

## 2020-06-24 ENCOUNTER — Other Ambulatory Visit: Payer: Self-pay | Admitting: Orthopaedic Surgery

## 2020-06-24 ENCOUNTER — Telehealth: Payer: Self-pay | Admitting: Orthopaedic Surgery

## 2020-06-24 NOTE — Telephone Encounter (Signed)
Sorry, happy to see her in office and inject hip , and discuss MRI of lumbar spine. Will have to be seen to get authorization for MRI likely. [place]

## 2020-06-24 NOTE — Telephone Encounter (Signed)
Destiny Hughes pts husband called asking if Dr. Lorin Mercy could send something else in other than hydrocodone? Destiny Hughes states the hydrocodone makes the pt sick and she believes something for muscle spasm may work for her. They would like a CB to update them if Dr. Lorin Mercy will be able to call something else in.   [phone]

## 2020-06-24 NOTE — Telephone Encounter (Signed)
noted 

## 2020-06-24 NOTE — Telephone Encounter (Signed)
I called patient's husband and advised.  

## 2020-06-24 NOTE — Telephone Encounter (Signed)
Please advise 

## 2020-06-26 ENCOUNTER — Encounter: Payer: Self-pay | Admitting: Orthopaedic Surgery

## 2020-06-26 ENCOUNTER — Ambulatory Visit (INDEPENDENT_AMBULATORY_CARE_PROVIDER_SITE_OTHER): Payer: Medicare Other | Admitting: Orthopaedic Surgery

## 2020-06-26 ENCOUNTER — Other Ambulatory Visit: Payer: Self-pay

## 2020-06-26 VITALS — BP 148/83 | HR 71 | Ht 66.0 in | Wt 186.0 lb

## 2020-06-26 DIAGNOSIS — M1611 Unilateral primary osteoarthritis, right hip: Secondary | ICD-10-CM

## 2020-06-26 NOTE — Progress Notes (Signed)
Office Visit Note   Patient: Destiny Hughes           Date of Birth: 04-11-1945           MRN: [id number] Visit Date: 06/26/2020              Requested by: Billie Ruddy, MD [place],  [place] [address] PCP: Billie Ruddy, MD   Assessment & Plan: Visit Diagnoses:  1. Unilateral primary osteoarthritis, right hip     Plan: Post hip injection she continued to complain of knee pain but states the pain in her hip was better.  She still walks with the knee limp on the right and this is the knee that said several procedures including total knee arthroplasty later revision then attempted tibial revision unsuccessful in removing implant.  She has a revision stem which is well-seated without loosening or subsidence and she has had aspirations and bone scans.  Just about pain medication since oxycodone makes her sick but hydrocodone and is better for.  Long history of taking medication for pain problems related to her thoracotomy.  I discussed with her best to avoid narcotic medication so that if she does not get her A1c under control and decides to proceed with total hip arthroplasty for moderate to severe right hip osteoarthritis pain medication would be effective.  Follow-Up Instructions: No follow-ups on file.   Orders:  Orders Placed This Encounter  Procedures  . Large Joint Inj: R hip joint   No orders of the defined types were placed in this encounter.     Procedures: Large Joint Inj: R hip joint on 06/26/2020 2:18 PM Medications: 0.5 mL lidocaine 1 %; 4 mL bupivacaine 0.25 %; 5 mL lidocaine (PF) 1 %      Clinical Data: No additional findings.   Subjective: Chief Complaint  Patient presents with  . Right Hip - Pain    HPI 75 year old female returns.  A1c is elevated at 7.9 and she is frustrated that she is not able to proceed with hip arthroplasty.  We discussed in the past she had injection done did not seem to get any relief.  I have asked  her to return today to get an injection with straight Marcaine and lidocaine into her right hip which has moderate to severe degenerative changes.  Reviewed past history where she had avascular necrosis opposite left hip back in around 2004 she had bipolar hemiarthroplasty which is done well since that time she has been very happy with it.  She has had problems with thoracotomy with increased pain had a spinal cord stimulator placed to have problems with the leads, battery replaced ultimately spinal cord stimulator removed.  Review of Systems updated unchanged from 06/19/2020 office visit.  Of note is COPD smoking history hypertension thoracotomy pain spinal cord stimulator placement and later removal.  Diabetes A1c 7.9.  Positive for left hip bipolar doing well.   Objective: Vital Signs: BP (!) 148/83   Pulse 71   Ht 5\' 6"  (1.676 m)   Wt 186 lb (84.4 kg)   BMI 30.02 kg/m   Physical Exam Constitutional:      Appearance: She is well-developed.  HENT:     Head: Normocephalic.     Right Ear: External ear normal.     Left Ear: External ear normal.  Eyes:     Pupils: Pupils are equal, round, and reactive to light.  Neck:     Thyroid: No thyromegaly.  Trachea: No tracheal deviation.  Cardiovascular:     Rate and Rhythm: Normal rate.  Pulmonary:     Effort: Pulmonary effort is normal.  Abdominal:     Palpations: Abdomen is soft.  Skin:    General: Skin is warm and dry.  Neurological:     Mental Status: She is alert and oriented to person, place, and time.  Psychiatric:        Behavior: Behavior normal.     Comments: Mental history memory is fair.     Ortho Exam patient is amatory with a pronounced right knee limp she complains of pain with palpation about the knee.  Pain with internal rotation of her right hip at 10 to 15 degrees which gives her right groin pain.  Distal pulses palpable.  No erythema of her knee incision on the right.  Good range of motion of left hip without  pain. Specialty Comments:  No specialty comments available.  Imaging: No results found.   PMFS History: Patient Active Problem List   Diagnosis Date Noted  . Unilateral primary osteoarthritis, right hip 03/30/2020  . Unilateral primary osteoarthritis, left knee 03/30/2020  . History of fusion of cervical spine 07/03/2019  . Spinal cord stimulator dysfunction ([place]) 06/04/2018  . Chronic cough 05/21/2018  . Tremor 02/16/2018  . Chronic rhinitis 02/05/2018  . Hallucinations   . Psychosis ([place]) 11/07/2016  . Acute respiratory failure with hypoxia and hypercapnia ([place]) 11/03/2016  . Other chest pain 10/23/2016  . Tobacco abuse 10/23/2016  . Major depression with psychotic features ([place]) 10/19/2016  . Dementia with behavioral disturbance ([place]) 10/19/2016  . Depression due to physical illness   . Cognitive retention disorder   . Atrial flutter ([place]) 08/15/2016  . Paroxysmal atrial fibrillation ([place]) 08/15/2016  . SVT (supraventricular tachycardia) ([place]) 07/11/2016  . Weight loss 07/08/2016  . AKI (acute kidney injury) ([place]) 07/02/2016  . Acute kidney injury ([place]) 07/01/2016  . Hyperglycemia 07/01/2016  . Hyponatremia 07/01/2016  . Intractable nausea and vomiting 07/01/2016  . Abnormal EKG 07/01/2016  . Diastolic CHF ([place]) 65/99/3570  . Medication management 04/28/2015  . Emphysema lung ([place]) 12/26/2014  . Gastroesophageal reflux disease without esophagitis 12/26/2014  . Chronic bronchitis ([place]) 11/13/2014  . COPD (chronic obstructive pulmonary disease) ([place]) 10/03/2014  . At high risk for falls 10/03/2014  . Other specified hypothyroidism 10/03/2014  . Mitral valve prolapse 10/03/2014  . Encounter for Medicare annual wellness exam 10/03/2014  . IBS (irritable bowel syndrome) 04/01/2014  . S/P total knee replacement 04/22/2013  . Knee pain, chronic 04/22/2013  . Essential hypertension 04/15/2013  . Vitamin D deficiency 04/15/2013  . Encounter for long-term (current) use of  medications 04/15/2013  . Prediabetes 04/15/2013  . Hyperlipemia   . Glaucoma   . Chronic pain disorder   . Chronic back pain    Past Medical History:  Diagnosis Date  . Arthritis    OA- knees & back  neck  . Chronic back pain   . Cognitive retention disorder   . Collapse of right lung   . COPD (chronic obstructive pulmonary disease) (HCC)    Hx. Bronchitis occ, 01-25-11 somes issue now taking  Z-pak-started 01-24-11/Scar tissue  present  from previous lung collapse  . Dementia with psychosis ([place])   . Depression   . Depression   . Dyspnea   . Dysrhythmia 2018   treated with Ablation   . GERD (gastroesophageal reflux disease)    tx. TUMS  . Glaucoma   .  Headache   . History of kidney stones    surgically treated  . Hyperlipemia   . Hypertension   . Hypothyroidism    tx. Levothyroxine  . Major depression with psychotic features ([place])   . Nephrolithiasis   . Neuromuscular disorder ([place]) 01-25-11   Pain/nerve stimulator implanted -2 yrs ago.  Marland Kitchen Reflex, gag, absent   . Reflex, gag, absent     Family History  Problem Relation Age of Onset  . Heart attack Father   . Emphysema Father   . Aneurysm Mother        CEREBRAL  . Emphysema Mother   . Dementia Mother   . Diabetes Son   . Hypertension Son   . Hypertension Son   . Cancer Sister        Widely Metastatic  . Asthma Son        x2  . Breast cancer Paternal Aunt   . Rheum arthritis Maternal Aunt     Past Surgical History:  Procedure Laterality Date  . A-FLUTTER ABLATION N/A 08/16/2016   Procedure: A-Flutter Ablation;  Surgeon: Evans Lance, MD;  Location: [facility];  Service: Cardiovascular;  Laterality: N/A;  . ANTERIOR CERVICAL DECOMP/DISCECTOMY FUSION     3 levels, per family  . APPENDECTOMY  01-25-11  . CARPAL TUNNEL RELEASE  01-25-11   Bil.  . CERVICAL SPINE SURGERY  01-25-11   2005-multiple levels  . CHOLECYSTECTOMY  01-25-11   '07  . COLONOSCOPY WITH PROPOFOL N/A 07/23/2012   Procedure:  COLONOSCOPY WITH PROPOFOL;  Surgeon: Garlan Fair, MD;  Location: [facility];  Service: Endoscopy;  Laterality: N/A;  . detatched retna Bilateral    done July & August- 2019  . EYE SURGERY Bilateral 2019   repair of retina detachment- Dr. Zadie Rhine at surgical center   . JOINT REPLACEMENT  01-25-11   6'03 LTHA-hemi  . KNEE ARTHROPLASTY  01-25-11   '04-right, revised x2  . no gag reflex     Pt does not havea gag reflex following  siurgery to neck. Family unsure of date. Pt takes pill in apple sauce.  . SPINAL CORD STIMULATOR BATTERY EXCHANGE N/A 01/12/2018   Procedure: Spinal cord stimulator battery replacement;  Surgeon: Clydell Hakim, MD;  Location: [facility];  Service: Neurosurgery;  Laterality: N/A;  left  . SPINAL CORD STIMULATOR IMPLANT  2009 APPROX   DR. ELSNER  . SPINAL CORD STIMULATOR REMOVAL N/A 06/04/2018   Procedure: Removal of spinal cord stimulator, thoracic paddle and generator;  Surgeon: Kristeen Miss, MD;  Location: [facility];  Service: Neurosurgery;  Laterality: N/A;  . THORACIC SPINE SURGERY  01-25-11   6'02- then nerve stimulator implanted after  . TOTAL KNEE REVISION  02/01/2011   Procedure: TOTAL KNEE REVISION;  Surgeon: Mauri Pole;  Location: [facility];  Service: Orthopedics;  Laterality: Right;  . TUBAL LIGATION     Social History   Occupational History  . Occupation: disabled  Tobacco Use  . Smoking status: Former Smoker    Packs/day: 0.75    Years: 50.00    Pack years: 37.50    Types: Cigarettes    Start date: 09/13/1961    Quit date: 05/2016    Years since quitting: 4.1  . Smokeless tobacco: Never Used  . Tobacco comment: Decreased Down to 2/3 a Day if [place] It  Vaping Use  . Vaping Use: Never used  Substance and Sexual Activity  . Alcohol use: No    Alcohol/week:  0.0 standard drinks    Comment: none in 10 years  . Drug use: No  . Sexual activity: Never

## 2020-06-27 ENCOUNTER — Other Ambulatory Visit: Payer: Self-pay | Admitting: Orthopaedic Surgery

## 2020-06-27 MED ORDER — ONDANSETRON 4 MG PO TBDP: 4.0000 mg | ORAL_TABLET | Freq: Three times a day (TID) | ORAL | 0 refills | Status: DC | PRN

## 2020-06-27 MED ORDER — HYDROCODONE-ACETAMINOPHEN 5-325 MG PO TABS: 1.0000 | ORAL_TABLET | Freq: Three times a day (TID) | ORAL | 0 refills | Status: DC | PRN

## 2020-06-29 MED ORDER — LIDOCAINE HCL 1 % IJ SOLN
0.5000 mL | INTRAMUSCULAR | Status: AC | PRN
Start: 2020-06-26 — End: 2020-06-26
  Administered 2020-06-26: .5 mL

## 2020-06-29 MED ORDER — BUPIVACAINE HCL 0.25 % IJ SOLN
4.0000 mL | INTRAMUSCULAR | Status: AC | PRN
  Administered 2020-06-26: 4 mL via INTRA_ARTICULAR

## 2020-06-29 MED ORDER — LIDOCAINE HCL (PF) 1 % IJ SOLN
5.0000 mL | INTRAMUSCULAR | Status: AC | PRN
  Administered 2020-06-26: 5 mL

## 2020-07-06 ENCOUNTER — Other Ambulatory Visit: Payer: Self-pay | Admitting: Family Medicine

## 2020-07-08 ENCOUNTER — Ambulatory Visit (INDEPENDENT_AMBULATORY_CARE_PROVIDER_SITE_OTHER): Payer: Medicare Other | Admitting: Family Medicine

## 2020-07-08 ENCOUNTER — Other Ambulatory Visit: Payer: Self-pay

## 2020-07-08 ENCOUNTER — Encounter: Payer: Self-pay | Admitting: Family Medicine

## 2020-07-08 VITALS — BP 158/88 | HR 71 | Temp 97.5°F

## 2020-07-08 DIAGNOSIS — M1711 Unilateral primary osteoarthritis, right knee: Secondary | ICD-10-CM | POA: Diagnosis not present

## 2020-07-08 DIAGNOSIS — I10 Essential (primary) hypertension: Secondary | ICD-10-CM | POA: Diagnosis not present

## 2020-07-08 DIAGNOSIS — E1169 Type 2 diabetes mellitus with other specified complication: Secondary | ICD-10-CM | POA: Diagnosis not present

## 2020-07-08 DIAGNOSIS — E119 Type 2 diabetes mellitus without complications: Secondary | ICD-10-CM

## 2020-07-08 HISTORY — DX: Type 2 diabetes mellitus without complications: E11.9

## 2020-07-08 LAB — POCT GLYCOSYLATED HEMOGLOBIN (HGB A1C): Hemoglobin A1C: 7.5 % — AB (ref 4.0–5.6)

## 2020-07-08 NOTE — Progress Notes (Signed)
Subjective:    Patient ID: Destiny Hughes, female    DOB: 1945/08/04, 75 y.o.   MRN: [id number]  Chief Complaint  Patient presents with  . Follow-up    HPI Patient is a 75 yo female with pmh sig for a flutter, diastolic CHF, HTN, COPD, emphysema, GERD, DM 2, hypothyroidism, dementia arthritis, IBS, tobacco use, neuromuscular disorder with spinal cord stimulator dysfunction who was seen today for f/u.  Pt states in order to have surgery on her knee A1C has to be <7.5%.  Hgb A1C was 7.9% on 06/22/20.  Pt requesting recheck as she is in pain and wants surgery.  Past Medical History:  Diagnosis Date  . Arthritis    OA- knees & back  neck  . Chronic back pain   . Cognitive retention disorder   . Collapse of right lung   . COPD (chronic obstructive pulmonary disease) (HCC)    Hx. Bronchitis occ, 01-25-11 somes issue now taking  Z-pak-started 01-24-11/Scar tissue  present  from previous lung collapse  . Dementia with psychosis ([place])   . Depression   . Depression   . Dyspnea   . Dysrhythmia 2018   treated with Ablation   . GERD (gastroesophageal reflux disease)    tx. TUMS  . Glaucoma   . Headache   . History of kidney stones    surgically treated  . Hyperlipemia   . Hypertension   . Hypothyroidism    tx. Levothyroxine  . Major depression with psychotic features ([place])   . Nephrolithiasis   . Neuromuscular disorder ([place]) 01-25-11   Pain/nerve stimulator implanted -2 yrs ago.  Marland Kitchen Reflex, gag, absent   . Reflex, gag, absent     Allergies  Allergen Reactions  . Albuterol Shortness Of Breath and Other (See Comments)  . Penicillins Anaphylaxis, Swelling and Other (See Comments)    Has patient had a PCN reaction causing immediate rash, facial/tongue/throat swelling, SOB or lightheadedness with hypotension: Yes Has patient had a PCN reaction causing severe rash involving mucus membranes or skin necrosis: Rash Has patient had a PCN reaction that required hospitalization: No Has  patient had a PCN reaction occurring within the last 10 years: No If all of the above answers are "NO", then may proceed with Cephalosporin use.  . Sulfa Antibiotics Swelling and Other (See Comments)    Throat swells, but no shortness of breath noted  . Codeine Nausea And Vomiting  . Ecotrin [Aspirin] Nausea And Vomiting  . Zanaflex [Tizanidine] Other (See Comments)    Possible confusion    ROS General: Denies fever, chills, night sweats, changes in weight, changes in appetite HEENT: Denies headaches, ear pain, changes in vision, rhinorrhea, sore throat CV: Denies CP, palpitations, SOB, orthopnea Pulm: Denies SOB, cough, wheezing GI: Denies abdominal pain, nausea, vomiting, diarrhea, constipation GU: Denies dysuria, hematuria, frequency, vaginal discharge Msk: Denies muscle cramps  + joint pain Neuro: Denies weakness, numbness, tingling Skin: Denies rashes, bruising Psych: Denies depression, anxiety, hallucinations    Objective:    Blood pressure (!) 158/88, pulse 71, temperature (!) 97.5 F (36.4 C), temperature source Oral, SpO2 92 %.  Gen. Pleasant, well-nourished, in no distress, normal affect   HEENT: [place]/AT, face symmetric, conjunctiva clear, no scleral icterus, PERRLA, EOMI, nares patent without drainage Lungs: no accessory muscle use Cardiovascular: RRR, no peripheral edema Musculoskeletal: No deformities, no cyanosis or clubbing, normal tone Neuro:  A&Ox3, CN II-XII intact, sitting in transport wheelchair 2/2 pain with walking Skin:  Warm, no lesions/  rash   Wt Readings from Last 3 Encounters:  06/26/20 186 lb (84.4 kg)  06/22/20 186 lb (84.4 kg)  06/04/20 189 lb 9.5 oz (86 kg)    Lab Results  Component Value Date   WBC 5.9 03/02/2020   HGB 13.8 03/02/2020   HCT 41.5 03/02/2020   PLT 379 03/02/2020   GLUCOSE 175 (H) 03/02/2020   CHOL 338 (H) 03/02/2020   TRIG 397 (H) 03/02/2020   HDL 52 03/02/2020   LDLDIRECT 168.0 02/21/2019   LDLCALC 219 (H) 03/02/2020    ALT 15 03/02/2020   AST 14 03/02/2020   NA 130 (L) 03/02/2020   K 4.9 03/02/2020   CL 91 (L) 03/02/2020   CREATININE 1.05 (H) 03/02/2020   BUN 14 03/02/2020   CO2 28 03/02/2020   TSH 1.75 03/02/2020   INR 1.07 08/16/2016   HGBA1C 7.9 (A) 06/22/2020   MICROALBUR 0.4 05/22/2014    Assessment/Plan:  Type 2 diabetes mellitus with other specified complication, without long-term current use of insulin (HCC)  -Hemoglobin A1c 7.5% this visit -Continue current meds including metformin 500 mg twice daily -Given handouts - Plan: POC HgB A1c  Essential hypertension -Elevated -Likely 2/2 pain -We will recheck -Continue current meds -Lifestyle modifications encouraged  Primary osteoarthritis of right knee -Patient encouraged to follow-up with Ortho as A1c is now 7.5%.  F/u prn  Grier Mitts, MD

## 2020-07-08 NOTE — Patient Instructions (Signed)
Diabetes Mellitus Basics  Diabetes mellitus, or diabetes, is a long-term (chronic) disease. It occurs when the body does not properly use sugar (glucose) that is released from food after you eat. Diabetes mellitus may be caused by one or both of these problems:  Your pancreas does not make enough of a hormone called insulin.  Your body does not react in a normal way to the insulin that it makes. Insulin lets glucose enter cells in your body. This gives you energy. If you have diabetes, glucose cannot get into cells. This causes high blood glucose (hyperglycemia). How to treat and manage diabetes You may need to take insulin or other diabetes medicines daily to keep your glucose in balance. If you are prescribed insulin, you will learn how to give yourself insulin by injection. You may need to adjust the amount of insulin you take based on the foods that you eat. You will need to check your blood glucose levels using a glucose monitor as told by your health care provider. The readings can help determine if you have low or high blood glucose. Generally, you should have these blood glucose levels:  Before meals (preprandial): 80-130 mg/dL (4.4-7.2 mmol/L).  After meals (postprandial): below 180 mg/dL (10 mmol/L).  Hemoglobin A1c (HbA1c) level: less than 7%. Your health care provider will set treatment goals for you. Keep all follow-up visits. This is important. Follow these instructions at home: Diabetes medicines Take your diabetes medicines every day as told by your health care provider. List your diabetes medicines here:  Name of medicine: ______________________________ ? Amount (dose): _______________ Time (a.m./p.m.): _______________ Notes: ___________________________________  Name of medicine: ______________________________ ? Amount (dose): _______________ Time (a.m./p.m.): _______________ Notes: ___________________________________  Name of medicine:  ______________________________ ? Amount (dose): _______________ Time (a.m./p.m.): _______________ Notes: ___________________________________ Insulin If you use insulin, list the types of insulin you use here:  Insulin type: ______________________________ ? Amount (dose): _______________ Time (a.m./p.m.): _______________Notes: ___________________________________  Insulin type: ______________________________ ? Amount (dose): _______________ Time (a.m./p.m.): _______________ Notes: ___________________________________  Insulin type: ______________________________ ? Amount (dose): _______________ Time (a.m./p.m.): _______________ Notes: ___________________________________  Insulin type: ______________________________ ? Amount (dose): _______________ Time (a.m./p.m.): _______________ Notes: ___________________________________  Insulin type: ______________________________ ? Amount (dose): _______________ Time (a.m./p.m.): _______________ Notes: ___________________________________ Managing blood glucose Check your blood glucose levels using a glucose monitor as told by your health care provider. Write down the times that you check your glucose levels here:  Time: _______________ Notes: ___________________________________  Time: _______________ Notes: ___________________________________  Time: _______________ Notes: ___________________________________  Time: _______________ Notes: ___________________________________  Time: _______________ Notes: ___________________________________  Time: _______________ Notes: ___________________________________   Low blood glucose Low blood glucose (hypoglycemia) is when glucose is at or below 70 mg/dL (3.9 mmol/L). Symptoms may include:  Feeling: ? Hungry. ? Sweaty and clammy. ? Irritable or easily upset. ? Dizzy. ? Sleepy.  Having: ? A fast heartbeat. ? A headache. ? A change in your vision. ? Numbness around the mouth, lips, or  tongue.  Having trouble with: ? Moving (coordination). ? Sleeping. Treating low blood glucose To treat low blood glucose, eat or drink something containing sugar right away. If you can think clearly and swallow safely, follow the 15:15 rule:  Take 15 grams of a fast-acting carb (carbohydrate), as told by your health care provider.  Some fast-acting carbs are: ? Glucose tablets: take 3-4 tablets. ? Hard candy: eat 3-5 pieces. ? Fruit juice: drink 4 oz (120 mL). ? Regular (not diet) soda: drink 4-6 oz (120-180 mL). ? Honey or sugar:   eat 1 Tbsp (15 mL).  Check your blood glucose levels 15 minutes after you take the carb.  If your glucose is still at or below 70 mg/dL (3.9 mmol/L), take 15 grams of a carb again.  If your glucose does not go above 70 mg/dL (3.9 mmol/L) after 3 tries, get help right away.  After your glucose goes back to normal, eat a meal or a snack within 1 hour. Treating very low blood glucose If your glucose is at or below 54 mg/dL (3 mmol/L), you have very low blood glucose (severe hypoglycemia). This is an emergency. Do not wait to see if the symptoms will go away. Get medical help right away. Call your local emergency services (911 in the [place] Do not drive yourself to the hospital. Questions to ask your health care provider  Should I talk with a diabetes educator?  What equipment will I need to care for myself at home?  What diabetes medicines do I need? When should I take them?  How often do I need to check my blood glucose levels?  What number can I call if I have questions?  When is my follow-up visit?  Where can I find a support group for people with diabetes? Where to find more information  American Diabetes Association: www.diabetes.org  Association of Diabetes Care and Education Specialists: www.diabeteseducator.org Contact a health care provider if:  Your blood glucose is at or above 240 mg/dL (13.3 mmol/L) for 2 days in a row.  You have  been sick or have had a fever for 2 days or more, and you are not getting better.  You have any of these problems for more than 6 hours: ? You cannot eat or drink. ? You feel nauseous. ? You vomit. ? You have diarrhea. Get help right away if:  Your blood glucose is lower than 54 mg/dL (3 mmol/L).  You get confused.  You have trouble thinking clearly.  You have trouble breathing. These symptoms may represent a serious problem that is an emergency. Do not wait to see if the symptoms will go away. Get medical help right away. Call your local emergency services (911 in the [place] Do not drive yourself to the hospital. Summary  Diabetes mellitus is a chronic disease that occurs when the body does not properly use sugar (glucose) that is released from food after you eat.  Take insulin and diabetes medicines as told.  Check your blood glucose every day, as often as told.  Keep all follow-up visits. This is important. This information is not intended to replace advice given to you by your health care provider. Make sure you discuss any questions you have with your health care provider. Document Revised: 07/09/2019 Document Reviewed: 07/09/2019 Elsevier Patient Education  2021 [place].  Diabetes Mellitus and Nutrition, Adult When you have diabetes, or diabetes mellitus, it is very important to have healthy eating habits because your blood sugar (glucose) levels are greatly affected by what you eat and drink. Eating healthy foods in the right amounts, at about the same times every day, can help you:  Control your blood glucose.  Lower your risk of heart disease.  Improve your blood pressure.  Reach or maintain a healthy weight. What can affect my meal plan? Every person with diabetes is different, and each person has different needs for a meal plan. Your health care provider may recommend that you work with a dietitian to make a meal plan that is best for you. Your  meal plan may  vary depending on factors such as:  The calories you need.  The medicines you take.  Your weight.  Your blood glucose, blood pressure, and cholesterol levels.  Your activity level.  Other health conditions you have, such as heart or kidney disease. How do carbohydrates affect me? Carbohydrates, also called carbs, affect your blood glucose level more than any other type of food. Eating carbs naturally raises the amount of glucose in your blood. Carb counting is a method for keeping track of how many carbs you eat. Counting carbs is important to keep your blood glucose at a healthy level, especially if you use insulin or take certain oral diabetes medicines. It is important to know how many carbs you can safely have in each meal. This is different for every person. Your dietitian can help you calculate how many carbs you should have at each meal and for each snack. How does alcohol affect me? Alcohol can cause a sudden decrease in blood glucose (hypoglycemia), especially if you use insulin or take certain oral diabetes medicines. Hypoglycemia can be a life-threatening condition. Symptoms of hypoglycemia, such as sleepiness, dizziness, and confusion, are similar to symptoms of having too much alcohol.  Do not drink alcohol if: ? Your health care provider tells you not to drink. ? You are pregnant, may be pregnant, or are planning to become pregnant.  If you drink alcohol: ? Do not drink on an empty stomach. ? Limit how much you use to:  0-1 drink a day for women.  0-2 drinks a day for men. ? Be aware of how much alcohol is in your drink. In the [place] one drink equals one 12 oz bottle of beer (355 mL), one 5 oz glass of wine (148 mL), or one 1 oz glass of hard liquor (44 mL). ? Keep yourself hydrated with water, diet soda, or unsweetened iced tea.  Keep in mind that regular soda, juice, and other mixers may contain a lot of sugar and must be counted as carbs. What are tips for  following this plan? Reading food labels  Start by checking the serving size on the "Nutrition Facts" label of packaged foods and drinks. The amount of calories, carbs, fats, and other nutrients listed on the label is based on one serving of the item. Many items contain more than one serving per package.  Check the total grams (g) of carbs in one serving. You can calculate the number of servings of carbs in one serving by dividing the total carbs by 15. For example, if a food has 30 g of total carbs per serving, it would be equal to 2 servings of carbs.  Check the number of grams (g) of saturated fats and trans fats in one serving. Choose foods that have a low amount or none of these fats.  Check the number of milligrams (mg) of salt (sodium) in one serving. Most people should limit total sodium intake to less than 2,300 mg per day.  Always check the nutrition information of foods labeled as "low-fat" or "nonfat." These foods may be higher in added sugar or refined carbs and should be avoided.  Talk to your dietitian to identify your daily goals for nutrients listed on the label. Shopping  Avoid buying canned, pre-made, or processed foods. These foods tend to be high in fat, sodium, and added sugar.  Shop around the outside edge of the grocery store. This is where you will most often find fresh fruits and  vegetables, bulk grains, fresh meats, and fresh dairy. Cooking  Use low-heat cooking methods, such as baking, instead of high-heat cooking methods like deep frying.  Cook using healthy oils, such as olive, canola, or sunflower oil.  Avoid cooking with butter, cream, or high-fat meats. Meal planning  Eat meals and snacks regularly, preferably at the same times every day. Avoid going long periods of time without eating.  Eat foods that are high in fiber, such as fresh fruits, vegetables, beans, and whole grains. Talk with your dietitian about how many servings of carbs you can eat at  each meal.  Eat 4-6 oz (112-168 g) of lean protein each day, such as lean meat, chicken, fish, eggs, or tofu. One ounce (oz) of lean protein is equal to: ? 1 oz (28 g) of meat, chicken, or fish. ? 1 egg. ?  cup (62 g) of tofu.  Eat some foods each day that contain healthy fats, such as avocado, nuts, seeds, and fish.   What foods should I eat? Fruits Berries. Apples. Oranges. Peaches. Apricots. Plums. Grapes. Mango. Papaya. Pomegranate. Kiwi. Cherries. Vegetables Lettuce. Spinach. Leafy greens, including kale, chard, collard greens, and mustard greens. Beets. Cauliflower. Cabbage. Broccoli. Carrots. Green beans. Tomatoes. Peppers. Onions. Cucumbers. Brussels sprouts. Grains Whole grains, such as whole-wheat or whole-grain bread, crackers, tortillas, cereal, and pasta. Unsweetened oatmeal. Quinoa. Brown or wild rice. Meats and other proteins Seafood. Poultry without skin. Lean cuts of poultry and beef. Tofu. Nuts. Seeds. Dairy Low-fat or fat-free dairy products such as milk, yogurt, and cheese. The items listed above may not be a complete list of foods and beverages you can eat. Contact a dietitian for more information. What foods should I avoid? Fruits Fruits canned with syrup. Vegetables Canned vegetables. Frozen vegetables with butter or cream sauce. Grains Refined white flour and flour products such as bread, pasta, snack foods, and cereals. Avoid all processed foods. Meats and other proteins Fatty cuts of meat. Poultry with skin. Breaded or fried meats. Processed meat. Avoid saturated fats. Dairy Full-fat yogurt, cheese, or milk. Beverages Sweetened drinks, such as soda or iced tea. The items listed above may not be a complete list of foods and beverages you should avoid. Contact a dietitian for more information. Questions to ask a health care provider  Do I need to meet with a diabetes educator?  Do I need to meet with a dietitian?  What number can I call if I have  questions?  When are the best times to check my blood glucose? Where to find more information:  American Diabetes Association: diabetes.org  Academy of Nutrition and Dietetics: www.eatright.[employer] of Diabetes and Digestive and Kidney Diseases: [employer]  Association of Diabetes Care and Education Specialists: www.diabeteseducator.org Summary  It is important to have healthy eating habits because your blood sugar (glucose) levels are greatly affected by what you eat and drink.  A healthy meal plan will help you control your blood glucose and maintain a healthy lifestyle.  Your health care provider may recommend that you work with a dietitian to make a meal plan that is best for you.  Keep in mind that carbohydrates (carbs) and alcohol have immediate effects on your blood glucose levels. It is important to count carbs and to use alcohol carefully. This information is not intended to replace advice given to you by your health care provider. Make sure you discuss any questions you have with your health care provider. Document Revised: 02/12/2019 Document Reviewed: 02/12/2019 Elsevier Patient  Education  2021 [employer].

## 2020-07-09 ENCOUNTER — Telehealth: Payer: Self-pay | Admitting: Orthopaedic Surgery

## 2020-07-09 ENCOUNTER — Other Ambulatory Visit: Payer: Self-pay | Admitting: Orthopaedic Surgery

## 2020-07-09 MED ORDER — HYDROCODONE-ACETAMINOPHEN 5-325 MG PO TABS: 1.0000 | ORAL_TABLET | Freq: Three times a day (TID) | ORAL | 0 refills | Status: DC | PRN

## 2020-07-09 NOTE — Telephone Encounter (Signed)
Done. Destiny Hughes . thanks

## 2020-07-09 NOTE — Telephone Encounter (Signed)
Patient's husband called requesting a refill of wife's hydrocodone. Please send to pharmacy on file. Patient phone number is [phone].

## 2020-07-10 NOTE — Telephone Encounter (Signed)
Please call pt to schedule thank you!

## 2020-07-10 NOTE — Telephone Encounter (Signed)
I called and informed. Pt stated she had her A1C rechecked and wanted to know if you could do her sx now. Please advise

## 2020-07-10 NOTE — Telephone Encounter (Signed)
I called and scheduled surgery for 07/29/20.

## 2020-07-10 NOTE — Telephone Encounter (Signed)
Yes, A1C acceptable. Let sherrie know right THA , direct anterior , spinal , overnight obs.   Assist Jeneen Rinks , needs medical clearance . Surgery time 1.5 hrs plus 30 min. Thanks  ucall pt and let her know thanks

## 2020-07-13 ENCOUNTER — Other Ambulatory Visit: Payer: Self-pay

## 2020-07-13 ENCOUNTER — Telehealth: Payer: Self-pay

## 2020-07-13 ENCOUNTER — Other Ambulatory Visit: Payer: Self-pay | Admitting: Family Medicine

## 2020-07-13 DIAGNOSIS — E782 Mixed hyperlipidemia: Secondary | ICD-10-CM

## 2020-07-13 NOTE — Telephone Encounter (Signed)
Please advise 

## 2020-07-13 NOTE — Telephone Encounter (Signed)
Pt was called and informed and stated understanding. She is concerned about pain control after surgery since everything makes her sick. Pt did say she is disappointed Dr. Lorin Mercy cant give her anything else for pain. She stated tylenol doesn't help and she cant take aspirin.

## 2020-07-13 NOTE — Telephone Encounter (Signed)
Sorry. Has problems with oxycodone and hydrocodone. Likely anything else also will make her sick. Best to save pain meds for after surgery.ucall

## 2020-07-13 NOTE — Telephone Encounter (Signed)
Pt called stating that the hydrocodone that the was proscribed makes her sick to her stomach, but it does help the pain. She would like to know if she can be sent in a different medication to help with the pain

## 2020-07-16 ENCOUNTER — Ambulatory Visit: Payer: Medicare Other | Admitting: Emergency Medicine

## 2020-07-22 ENCOUNTER — Other Ambulatory Visit: Payer: Self-pay

## 2020-07-22 ENCOUNTER — Encounter: Payer: Self-pay | Admitting: Surgery

## 2020-07-22 ENCOUNTER — Ambulatory Visit (INDEPENDENT_AMBULATORY_CARE_PROVIDER_SITE_OTHER): Payer: Medicare Other | Admitting: Surgery

## 2020-07-22 VITALS — Ht 66.0 in | Wt 186.0 lb

## 2020-07-22 DIAGNOSIS — M1611 Unilateral primary osteoarthritis, right hip: Secondary | ICD-10-CM

## 2020-07-22 NOTE — Progress Notes (Signed)
75 year old white female with history of end-stage DJD right hip and pain comes in for preop evaluation.  Right hip symptoms unchanged in previous visit.  She is wanting to proceed with right total hip replacement as scheduled.  We have received preop cardiac clearance.  I do not see where Dr. Lorin Mercy has requested pulmonology clearance.  Medical history reviewed.  There is documented history of emphysema, COPD, chronic bronchitis, acute respiratory failure with hypoxia and hypercapnia.  Last seen by pulmonology office May 18, 2020.  Patient's husband canceled patient's pulmonology appointment with Dr. Baltazar Apo July 16, 2020.  Today history and physical performed.  Patient admits to exertional dyspnea.    Patient is wanting to proceed with right total hip replacement as scheduled.  With her pulmonary history I asked our surgery scheduler to get a clearance from Dr. Agustina Caroli office.  Patient's husband seemed upset with the fact that I was seeking this clearance and I repeatedly explained the reasoning for this.

## 2020-07-24 ENCOUNTER — Telehealth: Payer: Self-pay | Admitting: Emergency Medicine

## 2020-07-24 NOTE — Telephone Encounter (Signed)
[place] on 5/9- office closed now

## 2020-07-26 NOTE — Pre-Procedure Instructions (Signed)
Destiny Hughes  07/26/2020     Your procedure is scheduled on May 11  Report to [facility] entrance A at 10:30 A.M.  Call this number if you have problems the morning of surgery:  [phone]   Remember:  Do not eat  after midnight.   You may drink clear liquids until 9:30 A.M. the morning of surgery..  Clear liquids allowed are:                    Water, Juice (non-citric and without pulp - diabetics please choose diet or no sugar options), Carbonated beverages - (diabetics please choose diet or no sugar options), Clear Tea, Black Coffee only (no creamer, milk or cream including half and half), Plain Jell-O only (diabetics please choose diet or no sugar options), Gatorade (diabetics please choose diet or no sugar options) and Plain Popsicles only                              Enhanced Recovery after Surgery for Orthopedics Enhanced Recovery after Surgery is a protocol used to improve the stress on your body and your recovery after surgery.  Patient Instructions  . The night before surgery:  o No food after midnight. ONLY clear liquids after midnight   . The day of surgery (if you have diabetes):  o Drink ONE small bottle of water by ___9:30__ the morning of surgery o This bottle was given to you during your hospital  pre-op appointment visit.  o Nothing else to drink after completing the  Small bottle of water.         If you have questions, please contact your surgeon's office.     Take these medicines the morning of surgery with A SIP OF WATER :              Tylenol if needed              Amlodipine (norvasc)              Diphenhydramine (benadryl)              Gabapentin (neurontin)              Levothyroxine (synthroid)              Propranolol (inderal)              Rosuvastatin (crestor)              7 days prior to surgery STOP taking any Aspirin (unless otherwise instructed by your surgeon), Aleve, Naproxen, Ibuprofen, Motrin, Advil, Goody's, BC's, all  herbal medications, fish oil, and all vitamins.                          How to Manage Your Diabetes Before and After Surgery  Why is it important to control my blood sugar before and after surgery? . Improving blood sugar levels before and after surgery helps healing and can limit problems. . A way of improving blood sugar control is eating a healthy diet by: o  Eating less sugar and carbohydrates o  Increasing activity/exercise o  Talking with your doctor about reaching your blood sugar goals . High blood sugars (greater than 180 mg/dL) can raise your risk of infections and slow your recovery, so you will need to focus on controlling your diabetes during the weeks before surgery. Marland Kitchen  Make sure that the doctor who takes care of your diabetes knows about your planned surgery including the date and location.  How do I manage my blood sugar before surgery? . Check your blood sugar at least 4 times a day, starting 2 days before surgery, to make sure that the level is not too high or low. o Check your blood sugar the morning of your surgery when you wake up and every 2 hours until you get to the Short Stay unit. . If your blood sugar is less than 70 mg/dL, you will need to treat for low blood sugar: o Do not take insulin. o Treat a low blood sugar (less than 70 mg/dL) with  cup of clear juice (cranberry or apple), 4 glucose tablets, OR glucose gel. Recheck blood sugar in 15 minutes after treatment (to make sure it is greater than 70 mg/dL). If your blood sugar is not greater than 70 mg/dL on recheck, call [phone] o  for further instructions. . Report your blood sugar to the short stay nurse when you get to Short Stay.  . If you are admitted to the hospital after surgery: o Your blood sugar will be checked by the staff and you will probably be given insulin after surgery (instead of oral diabetes medicines) to make sure you have good blood sugar levels. o The goal for blood sugar control  after surgery is 80-180 mg/dL.       WHAT DO I DO ABOUT MY DIABETES MEDICATION?   Marland Kitchen Do not take oral diabetes medicines (pills) the morning of surgery.  (metformin/glucophage)      Do not wear jewelry, make-up or nail polish.  Do not wear lotions, powders, or perfumes, or deodorant.  Do not shave 48 hours prior to surgery.  Men may shave face and neck.  Do not bring valuables to the hospital.  [facility] is not responsible for any belongings or valuables.  Contacts, dentures or bridgework may not be worn into surgery.  Leave your suitcase in the car.  After surgery it may be brought to your room.  For patients admitted to the hospital, discharge time will be determined by your treatment team.  Patients discharged the day of surgery will not be allowed to drive home.    Special instructions:  [place]- Preparing For Surgery  Before surgery, you can play an important role. Because skin is not sterile, your skin needs to be as free of germs as possible. You can reduce the number of germs on your skin by washing with CHG (chlorahexidine gluconate) Soap before surgery.  CHG is an antiseptic cleaner which kills germs and bonds with the skin to continue killing germs even after washing.    Oral Hygiene is also important to reduce your risk of infection.  Remember - BRUSH YOUR TEETH THE MORNING OF SURGERY WITH YOUR REGULAR TOOTHPASTE  Please do not use if you have an allergy to CHG or antibacterial soaps. If your skin becomes reddened/irritated stop using the CHG.  Do not shave (including legs and underarms) for at least 48 hours prior to first CHG shower. It is OK to shave your face.  Please follow these instructions carefully.   1. Shower the NIGHT BEFORE SURGERY and the MORNING OF SURGERY with CHG.   2. If you chose to wash your hair, wash your hair first as usual with your normal shampoo.  3. After you shampoo, rinse your hair and body thoroughly to remove the  shampoo.  4. Use CHG as you would any other liquid soap. You can apply CHG directly to the skin and wash gently with a scrungie or a clean washcloth.   5. Apply the CHG Soap to your body ONLY FROM THE NECK DOWN.  Do not use on open wounds or open sores. Avoid contact with your eyes, ears, mouth and genitals (private parts). Wash Face and genitals (private parts)  with your normal soap.  6. Wash thoroughly, paying special attention to the area where your surgery will be performed.  7. Thoroughly rinse your body with warm water from the neck down.  8. DO NOT shower/wash with your normal soap after using and rinsing off the CHG Soap.  9. Pat yourself dry with a CLEAN TOWEL.  10. Wear CLEAN PAJAMAS to bed the night before surgery, wear comfortable clothes the morning of surgery  11. Place CLEAN SHEETS on your bed the night of your first shower and DO NOT SLEEP WITH PETS.    Day of Surgery:  Do not apply any deodorants/lotions.  Please wear clean clothes to the hospital/surgery center.   Remember to brush your teeth WITH YOUR REGULAR TOOTHPASTE.    Please read over the following fact sheets that you were given.

## 2020-07-27 ENCOUNTER — Ambulatory Visit (HOSPITAL_COMMUNITY)
Admission: RE | Admit: 2020-07-27 | Discharge: 2020-07-27 | Disposition: A | Discharge status: Home / Self Care | Payer: Medicare Other | Source: Ambulatory Visit | Attending: Surgery | Admitting: Surgery

## 2020-07-27 ENCOUNTER — Other Ambulatory Visit: Payer: Self-pay

## 2020-07-27 ENCOUNTER — Encounter (HOSPITAL_COMMUNITY): Payer: Self-pay

## 2020-07-27 ENCOUNTER — Other Ambulatory Visit: Payer: Self-pay | Admitting: Family Medicine

## 2020-07-27 ENCOUNTER — Encounter (HOSPITAL_COMMUNITY)
Admission: RE | Admit: 2020-07-27 | Discharge: 2020-07-27 | Disposition: A | Discharge status: Home / Self Care | Payer: Medicare Other | Source: Ambulatory Visit | Attending: Orthopaedic Surgery | Admitting: Orthopaedic Surgery

## 2020-07-27 DIAGNOSIS — F323 Major depressive disorder, single episode, severe with psychotic features: Secondary | ICD-10-CM | POA: Insufficient documentation

## 2020-07-27 DIAGNOSIS — Z79890 Hormone replacement therapy (postmenopausal): Secondary | ICD-10-CM | POA: Insufficient documentation

## 2020-07-27 DIAGNOSIS — I10 Essential (primary) hypertension: Secondary | ICD-10-CM | POA: Insufficient documentation

## 2020-07-27 DIAGNOSIS — Z20822 Contact with and (suspected) exposure to covid-19: Secondary | ICD-10-CM | POA: Insufficient documentation

## 2020-07-27 DIAGNOSIS — R0600 Dyspnea, unspecified: Secondary | ICD-10-CM | POA: Insufficient documentation

## 2020-07-27 DIAGNOSIS — K219 Gastro-esophageal reflux disease without esophagitis: Secondary | ICD-10-CM | POA: Insufficient documentation

## 2020-07-27 DIAGNOSIS — M1611 Unilateral primary osteoarthritis, right hip: Secondary | ICD-10-CM | POA: Insufficient documentation

## 2020-07-27 DIAGNOSIS — I4891 Unspecified atrial fibrillation: Secondary | ICD-10-CM | POA: Insufficient documentation

## 2020-07-27 DIAGNOSIS — E785 Hyperlipidemia, unspecified: Secondary | ICD-10-CM | POA: Insufficient documentation

## 2020-07-27 DIAGNOSIS — E119 Type 2 diabetes mellitus without complications: Secondary | ICD-10-CM | POA: Insufficient documentation

## 2020-07-27 DIAGNOSIS — E871 Hypo-osmolality and hyponatremia: Secondary | ICD-10-CM | POA: Insufficient documentation

## 2020-07-27 DIAGNOSIS — Z7951 Long term (current) use of inhaled steroids: Secondary | ICD-10-CM | POA: Insufficient documentation

## 2020-07-27 DIAGNOSIS — F329 Major depressive disorder, single episode, unspecified: Secondary | ICD-10-CM | POA: Insufficient documentation

## 2020-07-27 DIAGNOSIS — G8929 Other chronic pain: Secondary | ICD-10-CM | POA: Insufficient documentation

## 2020-07-27 DIAGNOSIS — Z79899 Other long term (current) drug therapy: Secondary | ICD-10-CM | POA: Insufficient documentation

## 2020-07-27 DIAGNOSIS — Z01818 Encounter for other preprocedural examination: Secondary | ICD-10-CM | POA: Insufficient documentation

## 2020-07-27 DIAGNOSIS — J449 Chronic obstructive pulmonary disease, unspecified: Secondary | ICD-10-CM | POA: Insufficient documentation

## 2020-07-27 DIAGNOSIS — Z7984 Long term (current) use of oral hypoglycemic drugs: Secondary | ICD-10-CM | POA: Insufficient documentation

## 2020-07-27 LAB — SURGICAL PCR SCREEN
MRSA, PCR: NEGATIVE
Staphylococcus aureus: NEGATIVE

## 2020-07-27 LAB — SARS CORONAVIRUS 2 (TAT 6-24 HRS): SARS Coronavirus 2: NEGATIVE

## 2020-07-27 LAB — URINALYSIS, ROUTINE W REFLEX MICROSCOPIC
Bilirubin Urine: NEGATIVE
Glucose, UA: NEGATIVE mg/dL
Hgb urine dipstick: NEGATIVE
Ketones, ur: NEGATIVE mg/dL
Nitrite: NEGATIVE
Protein, ur: NEGATIVE mg/dL
Specific Gravity, Urine: 1.02 (ref 1.005–1.030)
pH: 5 (ref 5.0–8.0)

## 2020-07-27 LAB — COMPREHENSIVE METABOLIC PANEL
ALT: 13 U/L (ref 0–44)
AST: 12 U/L — ABNORMAL LOW (ref 15–41)
Albumin: 4 g/dL (ref 3.5–5.0)
Alkaline Phosphatase: 64 U/L (ref 38–126)
Anion gap: 8 (ref 5–15)
BUN: 17 mg/dL (ref 8–23)
CO2: 28 mmol/L (ref 22–32)
Calcium: 9 mg/dL (ref 8.9–10.3)
Chloride: 95 mmol/L — ABNORMAL LOW (ref 98–111)
Creatinine, Ser: 0.99 mg/dL (ref 0.44–1.00)
GFR, Estimated: 59 mL/min — ABNORMAL LOW (ref >60)
Glucose, Bld: 140 mg/dL — ABNORMAL HIGH (ref 70–99)
Potassium: 4.4 mmol/L (ref 3.5–5.1)
Sodium: 131 mmol/L — ABNORMAL LOW (ref 135–145)
Total Bilirubin: 0.6 mg/dL (ref 0.3–1.2)
Total Protein: 6.9 g/dL (ref 6.5–8.1)

## 2020-07-27 LAB — CBC
HCT: 40.6 % (ref 36.0–46.0)
Hemoglobin: 13.5 g/dL (ref 12.0–15.0)
MCH: 29.8 pg (ref 26.0–34.0)
MCHC: 33.3 g/dL (ref 30.0–36.0)
MCV: 89.6 fL (ref 80.0–100.0)
Platelets: 379 10*3/uL (ref 150–400)
RBC: 4.53 MIL/uL (ref 3.87–5.11)
RDW: 13.5 % (ref 11.5–15.5)
WBC: 6.7 10*3/uL (ref 4.0–10.5)
nRBC: 0 % (ref 0.0–0.2)

## 2020-07-27 LAB — GLUCOSE, CAPILLARY: Glucose-Capillary: 135 mg/dL — ABNORMAL HIGH (ref 70–99)

## 2020-07-27 NOTE — Telephone Encounter (Signed)
Left message for Destiny Hughes to re-fax risk assessment as it was not found with in paperwork for Dr. [name] in office.

## 2020-07-27 NOTE — Progress Notes (Signed)
PCP - Dr. Grier Mitts Cardiologist - Dr. Lovena Le   Chest x-ray - 07/27/20 EKG - 05/12/20 Stress Test - 05/09/18 ECHO - 08/16/16 Cardiac Cath -   Pt has med hx of diabetes but was "unaware" that she was a diabetic even though taking metformin. Does not check CBG at home. Only aware that surgery would be done if A1C was down to 7.5   ERAS Protcol - yes PRE-SURGERY Ensure or G2- water  COVID TEST- PAT 07/27/20   Anesthesia review: yes  Patient denies shortness of breath, fever, cough and chest pain at PAT appointment   All instructions explained to the patient, with a verbal understanding of the material. Patient agrees to go over the instructions while at home for a better understanding. Patient also instructed to self quarantine after being tested for COVID-19. The opportunity to ask questions was provided.

## 2020-07-27 NOTE — Progress Notes (Addendum)
Your procedure is scheduled on Wednesday May 11.  Report to [facility] Main Entrance "A" at 10:30 A.M., and check in at the Admitting office.  Call this number if you have problems the morning of surgery: [phone]  Call [phone] if you have any questions prior to your surgery date Monday-Friday 8am-4pm   Remember: Do not eat after midnight the night before your surgery  You may drink clear liquids until 09:30 A.M. the morning of your surgery.   Clear liquids allowed are: Water, Non-Citrus Juices (without pulp), Carbonated Beverages, Clear Tea, Black Coffee Only, and Gatorade  Please complete your PRE-SURGERY WATER that was provided to you by 09:30 A.M. the morning of your surgery. Please, if able, drink it in one setting. DO NOT SIP.    Take these medicines the morning of surgery with A SIP OF WATER: amLODipine (NORVASC)  gabapentin (NEURONTIN)  levothyroxine (SYNTHROID) pantoprazole (PROTONIX)  propranolol (INDERAL)  rosuvastatin (CRESTOR)  If needed: acetaminophen (TYLENOL)   As of today, STOP taking any Aspirin (unless otherwise instructed by your surgeon), Aleve, Naproxen, Ibuprofen, Motrin, Advil, Goody's, BC's, all herbal medications, fish oil, and all vitamins.  ** PLEASE check your blood sugar the morning of your surgery when you wake up and every 2 hours until you get to the Short Stay unit.  If your blood sugar is less than 70 mg/dL, you will need to treat for low blood sugar: - Do not take insulin. - Treat a low blood sugar (less than 70 mg/dL) with  cup of clear juice (cranberry or apple), 4 glucose tablets, OR glucose gel. - Recheck blood sugar in 15 minutes after treatment (to make sure it is greater than 70 mg/dL). If your blood sugar is not greater than 70 mg/dL on recheck, call [phone] for further instructions.    The Morning of Surgery  Do not wear jewelry, make-up or nail polish.  Do not wear lotions, powders, or perfumes, or  deodorant  Do not shave 48 hours prior to surgery.    Do not bring valuables to the hospital.  [facility] is not responsible for any belongings or valuables.  If you are a smoker, DO NOT Smoke 24 hours prior to surgery  If you wear a CPAP at night please bring your mask the morning of surgery   Remember that you must have someone to transport you home after your surgery, and remain with you for 24 hours if you are discharged the same day.   Please bring cases for contacts, glasses, hearing aids, dentures or bridgework because it cannot be worn into surgery.    Leave your suitcase in the car.  After surgery it may be brought to your room.  For patients admitted to the hospital, discharge time will be determined by your treatment team.  Patients discharged the day of surgery will not be allowed to drive home.    Special instructions:   [place]- Preparing For Surgery  Before surgery, you can play an important role. Because skin is not sterile, your skin needs to be as free of germs as possible. You can reduce the number of germs on your skin by washing with CHG (chlorahexidine gluconate) Soap before surgery.  CHG is an antiseptic cleaner which kills germs and bonds with the skin to continue killing germs even after washing.    Oral Hygiene is also important to reduce your risk of infection.  Remember - BRUSH YOUR TEETH THE MORNING OF SURGERY WITH YOUR REGULAR TOOTHPASTE  Please do not use if you have an allergy to CHG or antibacterial soaps. If your skin becomes reddened/irritated stop using the CHG.  Do not shave (including legs and underarms) for at least 48 hours prior to first CHG shower. It is OK to shave your face.  Please follow these instructions carefully.   1. Shower the NIGHT BEFORE SURGERY and the MORNING OF SURGERY with CHG Soap.   2. If you chose to wash your hair and body, wash as usual with your normal shampoo and body-wash/soap.  3. Rinse your hair and body  thoroughly to remove the shampoo and soap.  4. Apply CHG directly to the skin (ONLY FROM THE NECK DOWN) and wash gently with a scrungie or a clean washcloth.   5. Do not use on open wounds or open sores. Avoid contact with your eyes, ears, mouth and genitals (private parts). Wash Face and genitals (private parts)  with your normal soap.   6. Wash thoroughly, paying special attention to the area where your surgery will be performed.  7. Thoroughly rinse your body with warm water from the neck down.  8. DO NOT shower/wash with your normal soap after using and rinsing off the CHG Soap.  9. Pat yourself dry with a CLEAN TOWEL.  10. Wear CLEAN PAJAMAS to bed the night before surgery  11. Place CLEAN SHEETS on your bed the night of your first shower and DO NOT SLEEP WITH PETS.  12. Wear comfortable clothes the morning of surgery.     Day of Surgery:  Please shower the morning of surgery with the CHG soap Do not apply any deodorants/lotions. Please wear clean clothes to the hospital/surgery center.   Remember to brush your teeth WITH YOUR REGULAR TOOTHPASTE.   Please read over the following fact sheets that you were given.

## 2020-07-27 NOTE — Telephone Encounter (Signed)
Estill Bamberg, have you seen this?

## 2020-07-28 ENCOUNTER — Telehealth: Payer: Self-pay | Admitting: *Deleted

## 2020-07-28 NOTE — Progress Notes (Signed)
Anesthesia Chart Review:  Case: [id number] Date/Time: 07/29/20 1215   Procedure: RIGHT TOTAL HIP ARTHROPLASTY ANTERIOR APPROACH (Right Hip)   Anesthesia type: Spinal   Pre-op diagnosis: right hip primary osteoarthritis   Location: [facility] OR ROOM 06 / [place] [facility]   Surgeons: Marybelle Killings, MD      DISCUSSION: Patient is a 75 year old Hughes scheduled for the above procedure.  Surgery was initially scheduled for 04/27/18, but was postponed due hyponatremia and need for additional cardiology and pulmonology input. (See my previous note from encounter 04/19/18 for additional details.)She is s/p Midwife, implant [employer] MRI compatible SCS generatoron 01/12/18.  History includes former smoker (quit 05/19/16), COPD (severe), dyspnea, HTN, HLD, DM2, afib/flutter (had been on b-blocker and digoxin for palpitations, but discontinued ~ 07/01/16 for bradycardia, readmit 07/11/16 with SVT/aflutter; s/p ablation 08/16/16), hypothyroidism, glaucoma, GERD, dementia/cognitive retention disorder, major depression with psychotic features, abnormal ("absent" gag reflex), chronic pain syndrome, spinal cord stimulator [employer] 2009; replacement with MRI compatible SCS generator 01/12/18; removal of spinal cord stimulator 06/04/18), hyponatremia (on NaCl tablets).   - Preoperative cardiology input outlined on 06/19/20 by Coletta Memos, NP-C, "Given past medical history and time since last visit, based on ACC/AHA guidelines, Jamira Barfuss Rodkey would be at acceptable risk for the planned procedure without further cardiovascular testing.   Her RCRI is a class I risk, 0.4% risk of major cardiac event."  - Last pulmonology visit with Dr. [name] 05/18/20. Surgeon has reached out to Dr. Agustina Caroli office about surgery plans. They have sent a message to Dr. [name] asking him to review and update his note.    07/27/20 presurgical COVID-19 test negative.  As of 07/28/20 4:30 PM,  07/27/20 preoperative chest x-ray report is still in process. Update from Dr. [name] is still pending. Will plan to follow-up prior to surgery.    VS: BP (!) 146/69   Pulse 62   Temp 36.9 C (Oral)   Resp 18   Ht 5' 5.5" (1.664 m)   Wt 84.1 kg   SpO2 96%   BMI 30.38 kg/m    PROVIDERS: Billie Ruddy, MDis PCP. Last visit 07/08/20 for DM2 follow-up. A1c improved to 7.5%.   Baltazar Apo, MD is pulmonologist. Last visit 05/21/20 for COPD and allergic rhinitis follow-up. Notes suggest getting her on a stable regimen has been challenging at times due to costs and perception that medications aren't helping. Previously, Stiolto thought to be beneficial to patient but not covered by her insurance. She received [employer] Cares Support and was still using. Advised to also continue albuterol 2 puffs as needed. Cristopher Peru, MD is EP cardiologist. Last visit 05/12/20 for SVT and atrial flutter (s/p catheter ablation 2018) follow-up. No change in medications, continue watchful waiting. Her [place] were previously discontinued per Dr. Lovena Le.  Casimiro Needle, Berneta Sages, MD is psychiatrist. Last Progress Note dated 05/19/20.  Andrey Spearman, MD is neurologist. Last out-patient visit 08/22/16. Seen as in-patient consult 10/03/16 by Roland Rack, MD for increased confusion.    LABS: Labs reviewed: Acceptable for surgery. UA with moderate leukocytes, negative nitriates. Labs are marked as reviewed by Benjiman Core, PA-C. A1c 7.5% 07/08/20. (all labs ordered are listed, but only abnormal results are displayed)  Labs Reviewed  COMPREHENSIVE METABOLIC PANEL - Abnormal; Notable for the following components:      Result Value   Sodium 131 (*)    Chloride 95 (*)    Glucose, Bld 140 (*)  AST 12 (*)    GFR, Estimated 59 (*)    All other components within normal limits  URINALYSIS, ROUTINE W REFLEX MICROSCOPIC - Abnormal; Notable for the following components:   Leukocytes,Ua MODERATE (*)    Bacteria, UA RARE (*)     All other components within normal limits  SURGICAL PCR SCREEN  SARS CORONAVIRUS 2 (TAT 6-24 HRS)  CBC     PFT/Spirometry: - 05/21/18: FVC 1.6 L (51%)  FEV1 0.9 L (39%)  FEV1/FVC 58% (Destiny%)  FEF25-75% 0.5 L (25%). Severe airway obstruction. -08/27/15: FVC 1.09 L (34%) FEV1 0.75 L (31%) FEV1/FVC 0.69 FEF 25-75 0.49 L (25%). DLCO uncorrected 42%.  -12/26/14: FVC 1.23 L (38%) FEV1 0.Destiny L (32%) FEV1/FVC 0.64 FEF 25-75 0.43 L (21%) TLC 4.96 L (92%) RV 128% ERV 27% DLCO uncorrected 51%   IMAGES: CXR 07/27/20: In process.  CT Chest Lung CA Screen 04/22/20: IMPRESSION: 1. Lung-RADS 2, benign appearance or behavior. Continue annual screening with low-dose chest CT without contrast in 12 months. 2. One vessel coronary atherosclerosis. 3. Aortic Atherosclerosis (ICD10-I70.0) and Emphysema (ICD10-J43.9).    EKG:05/12/20 ([facility]): NSR. First degree AV block. Poor r wave progression. Non-specific ST/T wave abnormality.     CV: Nuclear stress test 05/09/18:  Nuclear stress EF: 71%.  No T wave inversion was noted during stress.  There was no ST segment deviation noted during stress.  The study is normal. No reversible ischemia. LVEF 71% with normal wall motion. This is a low risk study.   TTE 11/05/16 ([place]): Summary: The left ventricle is normal in size. There is no thrombus. There is normal left ventricular wall thickness. Left ventricular systolic function is normal. Ejection Fraction = >55%. No regional wall motion abnormalitiesnoted.  The left atrial size is normal. Right atrial size is normal. The interatrial septum is intact with no evidence for an atrial septal defect. Trace mitral regurgitation. Mild tricuspid regurgitation.Right ventricular systolic pressure is elevated at 30- 46mmHg. (Comparison TTE 09/01/15:LVEF 60-65%, normal wall thickness, normal wall motion, stage 2 DD with elevated LV filling pressure, mild MR, normal LA size,mild RAE,  dilated RV with reduced systolic function, dilated IVC,no TR.)   TEE 08/16/16: Study Conclusions - Aortic valve: No evidence of vegetation. - Mitral valve: There was mild regurgitation. - Left atrium: No evidence of thrombus in the atrial cavity or appendage. - Tricuspid valve: No evidence of vegetation.   EPstudy/radiofrequency catheter ablation 08/16/16: CONCLUSIONS:  1. Isthmus-dependent right atrial flutter upon presentation.  2. Successful radiofrequency ablation of atrial flutter along the cavotricuspid isthmus with complete bidirectional isthmus block achieved.  3. Successful catheter ablation of inducible AVNRT 4. No inducible arrhythmias following ablation.  5. The patient had transient AV block after the ablation which resolved and did not return. 21 day Event monitor 07/2016: Atrial flutter and atrial fibrillation noted PVCs   Carotid U/S 05/13/16: Impressions: Heterogeneous plaque, bilaterally. Stable 1-39% bilateral ICA stenosis. Normal subclavian arteries, bilaterally. Patent vertebral arteries with antegrade flow.   Past Medical History:  Diagnosis Date  . Arthritis    OA- knees & back  neck  . Chronic back pain   . Cognitive retention disorder   . Collapse of right lung   . COPD (chronic obstructive pulmonary disease) (HCC)    Hx. Bronchitis occ, 01-25-11 somes issue now taking  Z-pak-started 01-24-11/Scar tissue  present  from previous lung collapse  . Dementia with psychosis ([place])   . Depression   . Depression   . Diabetes  mellitus ([place]) 07/08/2020  . Dyspnea   . Dysrhythmia 2018   treated with Ablation   . GERD (gastroesophageal reflux disease)    tx. TUMS  . Glaucoma   . Headache   . History of kidney stones    surgically treated  . Hyperlipemia   . Hypertension   . Hypothyroidism    tx. Levothyroxine  . Major depression with psychotic features ([place])   . Nephrolithiasis   . Neuromuscular disorder ([place]) 01-25-11   Pain/nerve  stimulator implanted -2 yrs ago.  Marland Kitchen Reflex, gag, absent   . Reflex, gag, absent     Past Surgical History:  Procedure Laterality Date  . A-FLUTTER ABLATION N/A 08/16/2016   Procedure: A-Flutter Ablation;  Surgeon: Evans Lance, MD;  Location: [place] [facility];  Service: Cardiovascular;  Laterality: N/A;  . ANTERIOR CERVICAL DECOMP/DISCECTOMY FUSION     3 levels, per family  . APPENDECTOMY  01-25-11  . CARPAL TUNNEL RELEASE  01-25-11   Bil.  . CERVICAL SPINE SURGERY  01-25-11   2005-multiple levels  . CHOLECYSTECTOMY  01-25-11   '07  . COLONOSCOPY WITH PROPOFOL N/A 07/23/2012   Procedure: COLONOSCOPY WITH PROPOFOL;  Surgeon: Garlan Fair, MD;  Location: [facility];  Service: Endoscopy;  Laterality: N/A;  . detatched retna Bilateral    done July & August- 2019  . EYE SURGERY Bilateral 2019   repair of retina detachment- Dr. Zadie Rhine at surgical center   . JOINT REPLACEMENT  01-25-11   6'03 LTHA-hemi  . KNEE ARTHROPLASTY  01-25-11   '04-right, revised x2  . no gag reflex     Pt does not havea gag reflex following  siurgery to neck. Family unsure of date. Pt takes pill in apple sauce.  . SPINAL CORD STIMULATOR BATTERY EXCHANGE N/A 01/12/2018   Procedure: Spinal cord stimulator battery replacement;  Surgeon: Clydell Hakim, MD;  Location: [place];  Service: Neurosurgery;  Laterality: N/A;  left  . SPINAL CORD STIMULATOR IMPLANT  2009 APPROX   DR. ELSNER  . SPINAL CORD STIMULATOR REMOVAL N/A 06/04/2018   Procedure: Removal of spinal cord stimulator, thoracic paddle and generator;  Surgeon: Kristeen Miss, MD;  Location: [facility];  Service: Neurosurgery;  Laterality: N/A;  . THORACIC SPINE SURGERY  01-25-11   6'02- then nerve stimulator implanted after  . TOTAL KNEE REVISION  02/01/2011   Procedure: TOTAL KNEE REVISION;  Surgeon: Mauri Pole;  Location: [facility];  Service: Orthopedics;  Laterality: Right;  . TUBAL LIGATION      MEDICATIONS: . acetaminophen (TYLENOL) 500 MG tablet  .  amLODipine (NORVASC) 5 MG tablet  . buPROPion (WELLBUTRIN XL) 150 MG 24 hr tablet  . diphenhydrAMINE (BENADRYL) 25 MG tablet  . donepezil (ARICEPT) 10 MG tablet  . donepezil (ARICEPT) 23 MG TABS tablet  . fluticasone (FLONASE) 50 MCG/ACT nasal spray  . gabapentin (NEURONTIN) 100 MG capsule  . HYDROcodone-acetaminophen (NORCO/VICODIN) 5-325 MG tablet  . ipratropium (ATROVENT) 0.03 % nasal spray  . levothyroxine (SYNTHROID) 125 MCG tablet  . lisinopril (ZESTRIL) 10 MG tablet  . lisinopril (ZESTRIL) 5 MG tablet  . melatonin 5 MG TABS  . metFORMIN (GLUCOPHAGE) 500 MG tablet  . ondansetron (ZOFRAN ODT) 4 MG disintegrating tablet  . pantoprazole (PROTONIX) 40 MG tablet  . propranolol (INDERAL) 80 MG tablet  . rosuvastatin (CRESTOR) 5 MG tablet  . sodium chloride 1 g tablet  . temazepam (RESTORIL) 30 MG capsule  . Tiotropium Bromide-Olodaterol (STIOLTO RESPIMAT) 2.5-2.5 MCG/ACT AERS  .  Tiotropium Bromide-Olodaterol (STIOLTO RESPIMAT) 2.5-2.5 MCG/ACT AERS  . Tiotropium Bromide-Olodaterol (STIOLTO RESPIMAT) 2.5-2.5 MCG/ACT AERS   No current facility-administered medications for this encounter.  Medications listed as not taking: Wellbutin XL, Aricept 23 mg (on 10 mg), Flonase, Atrovent, Norco, lisinopril 5 mg (on 10 mg), Zofran.   Myra Gianotti, PA-C Surgical Short Stay/Anesthesiology [facility] Phone [phone] [facility] Phone [phone] 07/28/2020 4:30 PM

## 2020-07-28 NOTE — Telephone Encounter (Signed)
Per office protocol - forms are no longer completed - Dr. [name] will need to do a risk assessment based on patient's last ov note and add an addendum and we can fax it back to the surgeon. Rec'd faxed request for clearance from [facility] - pt has not been seen within the 60 days last seen on 05/18/2020 - pt canceled 07/16/2020 f/u because she was having surgery. If Dr. [name] is okay an addendum can be added to that last note and it should be faxed to the attn of [place] ph [phone], fax [phone]. -pr

## 2020-07-28 NOTE — Telephone Encounter (Signed)
Ortho bundle Pre-op call completed. 

## 2020-07-28 NOTE — Care Plan (Signed)
RNCM call to patient to discuss her upcoming Right total hip replacement per Dr. Lorin Mercy on 07/29/20. She is an Ortho bundle patient through [facility] and is agreeable to case management. She lives with her husband, who will be assisting at home after discharge. Her son will also be assisting with needs. She has a FWW and elevated/handicapped toilets in the home. No DME needed. Reviewed all post-op care instructions and provided opportunity for questions. Will continue to follow for needs. Don't anticipate need for HHPT at this time, but will make referral if MD feels this is needed.

## 2020-07-29 ENCOUNTER — Encounter (HOSPITAL_COMMUNITY): Payer: Self-pay | Admitting: Orthopaedic Surgery

## 2020-07-29 ENCOUNTER — Encounter (HOSPITAL_COMMUNITY)
Admission: RE | Disposition: A | Discharge status: Home / Self Care | Payer: Self-pay | Source: Home / Self Care | Attending: Orthopaedic Surgery

## 2020-07-29 ENCOUNTER — Ambulatory Visit (HOSPITAL_COMMUNITY): Payer: Medicare Other | Admitting: Anesthesiology

## 2020-07-29 ENCOUNTER — Other Ambulatory Visit: Payer: Self-pay

## 2020-07-29 ENCOUNTER — Observation Stay (HOSPITAL_COMMUNITY): Payer: Medicare Other

## 2020-07-29 ENCOUNTER — Inpatient Hospital Stay (HOSPITAL_COMMUNITY)
Admission: RE | Admit: 2020-07-29 | Discharge: 2020-07-31 | DRG: 470 | Disposition: A | Discharge status: Home / Self Care | Payer: Medicare Other | Attending: Orthopaedic Surgery | Admitting: Orthopaedic Surgery

## 2020-07-29 ENCOUNTER — Ambulatory Visit (HOSPITAL_COMMUNITY): Payer: Medicare Other

## 2020-07-29 ENCOUNTER — Ambulatory Visit (HOSPITAL_COMMUNITY): Payer: Medicare Other | Admitting: Vascular Surgery

## 2020-07-29 DIAGNOSIS — E119 Type 2 diabetes mellitus without complications: Secondary | ICD-10-CM | POA: Diagnosis present

## 2020-07-29 DIAGNOSIS — Z471 Aftercare following joint replacement surgery: Secondary | ICD-10-CM | POA: Diagnosis not present

## 2020-07-29 DIAGNOSIS — Z79890 Hormone replacement therapy (postmenopausal): Secondary | ICD-10-CM

## 2020-07-29 DIAGNOSIS — G8929 Other chronic pain: Secondary | ICD-10-CM | POA: Diagnosis present

## 2020-07-29 DIAGNOSIS — M1611 Unilateral primary osteoarthritis, right hip: Secondary | ICD-10-CM | POA: Diagnosis not present

## 2020-07-29 DIAGNOSIS — J439 Emphysema, unspecified: Secondary | ICD-10-CM | POA: Diagnosis not present

## 2020-07-29 DIAGNOSIS — Z96641 Presence of right artificial hip joint: Secondary | ICD-10-CM

## 2020-07-29 DIAGNOSIS — I480 Paroxysmal atrial fibrillation: Secondary | ICD-10-CM | POA: Diagnosis not present

## 2020-07-29 DIAGNOSIS — H409 Unspecified glaucoma: Secondary | ICD-10-CM | POA: Diagnosis present

## 2020-07-29 DIAGNOSIS — I110 Hypertensive heart disease with heart failure: Secondary | ICD-10-CM | POA: Diagnosis not present

## 2020-07-29 DIAGNOSIS — Z882 Allergy status to sulfonamides status: Secondary | ICD-10-CM

## 2020-07-29 DIAGNOSIS — F0390 Unspecified dementia without behavioral disturbance: Secondary | ICD-10-CM | POA: Diagnosis not present

## 2020-07-29 DIAGNOSIS — Z87442 Personal history of urinary calculi: Secondary | ICD-10-CM

## 2020-07-29 DIAGNOSIS — Z9851 Tubal ligation status: Secondary | ICD-10-CM

## 2020-07-29 DIAGNOSIS — I341 Nonrheumatic mitral (valve) prolapse: Secondary | ICD-10-CM | POA: Diagnosis present

## 2020-07-29 DIAGNOSIS — K219 Gastro-esophageal reflux disease without esophagitis: Secondary | ICD-10-CM | POA: Diagnosis present

## 2020-07-29 DIAGNOSIS — Z20822 Contact with and (suspected) exposure to covid-19: Secondary | ICD-10-CM | POA: Diagnosis present

## 2020-07-29 DIAGNOSIS — Z09 Encounter for follow-up examination after completed treatment for conditions other than malignant neoplasm: Secondary | ICD-10-CM

## 2020-07-29 DIAGNOSIS — Z888 Allergy status to other drugs, medicaments and biological substances status: Secondary | ICD-10-CM

## 2020-07-29 DIAGNOSIS — Z79899 Other long term (current) drug therapy: Secondary | ICD-10-CM

## 2020-07-29 DIAGNOSIS — E038 Other specified hypothyroidism: Secondary | ICD-10-CM | POA: Diagnosis not present

## 2020-07-29 DIAGNOSIS — I5032 Chronic diastolic (congestive) heart failure: Secondary | ICD-10-CM | POA: Diagnosis not present

## 2020-07-29 DIAGNOSIS — Z8249 Family history of ischemic heart disease and other diseases of the circulatory system: Secondary | ICD-10-CM

## 2020-07-29 DIAGNOSIS — Z880 Allergy status to penicillin: Secondary | ICD-10-CM

## 2020-07-29 DIAGNOSIS — M549 Dorsalgia, unspecified: Secondary | ICD-10-CM | POA: Diagnosis present

## 2020-07-29 DIAGNOSIS — M25751 Osteophyte, right hip: Secondary | ICD-10-CM | POA: Diagnosis not present

## 2020-07-29 DIAGNOSIS — I471 Supraventricular tachycardia: Secondary | ICD-10-CM | POA: Diagnosis not present

## 2020-07-29 DIAGNOSIS — Z87891 Personal history of nicotine dependence: Secondary | ICD-10-CM

## 2020-07-29 DIAGNOSIS — Z419 Encounter for procedure for purposes other than remedying health state, unspecified: Secondary | ICD-10-CM

## 2020-07-29 DIAGNOSIS — E785 Hyperlipidemia, unspecified: Secondary | ICD-10-CM | POA: Diagnosis present

## 2020-07-29 DIAGNOSIS — Z87892 Personal history of anaphylaxis: Secondary | ICD-10-CM

## 2020-07-29 DIAGNOSIS — Z833 Family history of diabetes mellitus: Secondary | ICD-10-CM

## 2020-07-29 DIAGNOSIS — Z885 Allergy status to narcotic agent status: Secondary | ICD-10-CM

## 2020-07-29 DIAGNOSIS — Z825 Family history of asthma and other chronic lower respiratory diseases: Secondary | ICD-10-CM

## 2020-07-29 DIAGNOSIS — Z981 Arthrodesis status: Secondary | ICD-10-CM

## 2020-07-29 DIAGNOSIS — Z9181 History of falling: Secondary | ICD-10-CM

## 2020-07-29 HISTORY — PX: TOTAL HIP ARTHROPLASTY: SHX124

## 2020-07-29 LAB — GLUCOSE, CAPILLARY
Glucose-Capillary: 163 mg/dL — ABNORMAL HIGH (ref 70–99)
Glucose-Capillary: 195 mg/dL — ABNORMAL HIGH (ref 70–99)
Glucose-Capillary: 129 mg/dL — ABNORMAL HIGH (ref 70–99)

## 2020-07-29 SURGERY — ARTHROPLASTY, HIP, TOTAL, ANTERIOR APPROACH
Anesthesia: Spinal | Site: Hip | Laterality: Right

## 2020-07-29 MED ORDER — ONDANSETRON HCL 4 MG/2ML IJ SOLN
4.0000 mg | Freq: Once | INTRAMUSCULAR | Status: AC | PRN
  Administered 2020-07-29: 4 mg via INTRAVENOUS

## 2020-07-29 MED ORDER — METFORMIN HCL 500 MG PO TABS
500.0000 mg | ORAL_TABLET | Freq: Two times a day (BID) | ORAL | Status: DC
  Administered 2020-07-30 – 2020-07-31 (×3): 500 mg via ORAL
  Filled 2020-07-29 (×3): qty 1

## 2020-07-29 MED ORDER — FENTANYL CITRATE (PF) 250 MCG/5ML IJ SOLN
INTRAMUSCULAR | Status: AC
  Filled 2020-07-29: qty 5

## 2020-07-29 MED ORDER — PHENOL 1.4 % MT LIQD: 1.0000 | OROMUCOSAL | Status: DC | PRN

## 2020-07-29 MED ORDER — ASPIRIN EC 325 MG PO TBEC
325.0000 mg | DELAYED_RELEASE_TABLET | Freq: Every day | ORAL | Status: DC
  Administered 2020-07-30 – 2020-07-31 (×2): 325 mg via ORAL
  Filled 2020-07-29 (×2): qty 1

## 2020-07-29 MED ORDER — PANTOPRAZOLE SODIUM 40 MG PO TBEC
40.0000 mg | DELAYED_RELEASE_TABLET | Freq: Every day | ORAL | Status: DC
  Administered 2020-07-30 – 2020-07-31 (×2): 40 mg via ORAL
  Filled 2020-07-29 (×3): qty 1

## 2020-07-29 MED ORDER — PROPOFOL 10 MG/ML IV BOLUS
INTRAVENOUS | Status: AC
  Filled 2020-07-29: qty 20

## 2020-07-29 MED ORDER — ARFORMOTEROL TARTRATE 15 MCG/2ML IN NEBU
15.0000 ug | INHALATION_SOLUTION | Freq: Two times a day (BID) | RESPIRATORY_TRACT | Status: DC
  Filled 2020-07-29 (×5): qty 2

## 2020-07-29 MED ORDER — ROSUVASTATIN CALCIUM 5 MG PO TABS
5.0000 mg | ORAL_TABLET | Freq: Every day | ORAL | Status: DC
  Administered 2020-07-30 – 2020-07-31 (×2): 5 mg via ORAL
  Filled 2020-07-29 (×3): qty 1

## 2020-07-29 MED ORDER — AMLODIPINE BESYLATE 5 MG PO TABS
5.0000 mg | ORAL_TABLET | Freq: Every day | ORAL | Status: DC
  Administered 2020-07-30 – 2020-07-31 (×2): 5 mg via ORAL
  Filled 2020-07-29 (×3): qty 1

## 2020-07-29 MED ORDER — PROPRANOLOL HCL 40 MG PO TABS
40.0000 mg | ORAL_TABLET | Freq: Three times a day (TID) | ORAL | Status: DC
  Administered 2020-07-29 – 2020-07-31 (×5): 40 mg via ORAL
  Filled 2020-07-29 (×7): qty 1

## 2020-07-29 MED ORDER — VANCOMYCIN HCL IN DEXTROSE 1-5 GM/200ML-% IV SOLN
1000.0000 mg | INTRAVENOUS | Status: AC
  Administered 2020-07-29: 1000 mg via INTRAVENOUS
  Filled 2020-07-29: qty 200

## 2020-07-29 MED ORDER — AMISULPRIDE (ANTIEMETIC) 5 MG/2ML IV SOLN
INTRAVENOUS | Status: AC
  Filled 2020-07-29: qty 2

## 2020-07-29 MED ORDER — BUPIVACAINE LIPOSOME 1.3 % IJ SUSP
INTRAMUSCULAR | Status: AC
  Filled 2020-07-29: qty 20

## 2020-07-29 MED ORDER — EPHEDRINE 5 MG/ML INJ
INTRAVENOUS | Status: AC
  Filled 2020-07-29: qty 10

## 2020-07-29 MED ORDER — ACETAMINOPHEN 325 MG PO TABS
325.0000 mg | ORAL_TABLET | Freq: Four times a day (QID) | ORAL | Status: DC | PRN
  Administered 2020-07-30 – 2020-07-31 (×3): 650 mg via ORAL
  Filled 2020-07-29 (×3): qty 2

## 2020-07-29 MED ORDER — OXYCODONE HCL 5 MG PO TABS
ORAL_TABLET | ORAL | Status: AC
  Filled 2020-07-29: qty 1

## 2020-07-29 MED ORDER — METHOCARBAMOL 1000 MG/10ML IJ SOLN
500.0000 mg | Freq: Four times a day (QID) | INTRAVENOUS | Status: DC | PRN
  Filled 2020-07-29: qty 5

## 2020-07-29 MED ORDER — HYDROMORPHONE HCL 1 MG/ML IJ SOLN
INTRAMUSCULAR | Status: AC
  Administered 2020-07-29: 0.5 mg via INTRAVENOUS
  Filled 2020-07-29: qty 1

## 2020-07-29 MED ORDER — BUPIVACAINE-EPINEPHRINE (PF) 0.25% -1:200000 IJ SOLN
INTRAMUSCULAR | Status: AC
  Filled 2020-07-29: qty 30

## 2020-07-29 MED ORDER — ACETAMINOPHEN 325 MG PO TABS: 325.0000 mg | ORAL_TABLET | ORAL | Status: DC | PRN

## 2020-07-29 MED ORDER — ONDANSETRON HCL 4 MG/2ML IJ SOLN
INTRAMUSCULAR | Status: AC
  Filled 2020-07-29: qty 2

## 2020-07-29 MED ORDER — MELATONIN 5 MG PO TABS
5.0000 mg | ORAL_TABLET | Freq: Every day | ORAL | Status: DC
  Administered 2020-07-29 – 2020-07-30 (×2): 5 mg via ORAL
  Filled 2020-07-29 (×2): qty 1

## 2020-07-29 MED ORDER — PROPOFOL 500 MG/50ML IV EMUL
INTRAVENOUS | Status: DC | PRN
  Administered 2020-07-29: 75 ug/kg/min via INTRAVENOUS

## 2020-07-29 MED ORDER — DOCUSATE SODIUM 100 MG PO CAPS
100.0000 mg | ORAL_CAPSULE | Freq: Two times a day (BID) | ORAL | Status: DC
  Administered 2020-07-29 – 2020-07-31 (×4): 100 mg via ORAL
  Filled 2020-07-29 (×4): qty 1

## 2020-07-29 MED ORDER — TRANEXAMIC ACID-NACL 1000-0.7 MG/100ML-% IV SOLN
INTRAVENOUS | Status: AC
  Filled 2020-07-29: qty 100

## 2020-07-29 MED ORDER — PROPOFOL 10 MG/ML IV BOLUS
INTRAVENOUS | Status: DC | PRN
  Administered 2020-07-29: 20 mg via INTRAVENOUS
  Administered 2020-07-29: 40 mg via INTRAVENOUS
  Administered 2020-07-29: 20 mg via INTRAVENOUS

## 2020-07-29 MED ORDER — MEPERIDINE HCL 25 MG/ML IJ SOLN: 6.2500 mg | INTRAMUSCULAR | Status: DC | PRN

## 2020-07-29 MED ORDER — ORAL CARE MOUTH RINSE: 15.0000 mL | Freq: Once | OROMUCOSAL | Status: AC

## 2020-07-29 MED ORDER — METOCLOPRAMIDE HCL 5 MG PO TABS: 5.0000 mg | ORAL_TABLET | Freq: Three times a day (TID) | ORAL | Status: DC | PRN

## 2020-07-29 MED ORDER — ACETAMINOPHEN 160 MG/5ML PO SOLN: 325.0000 mg | ORAL | Status: DC | PRN

## 2020-07-29 MED ORDER — ONDANSETRON HCL 4 MG/2ML IJ SOLN
4.0000 mg | Freq: Four times a day (QID) | INTRAMUSCULAR | Status: DC | PRN
  Administered 2020-07-29 – 2020-07-30 (×2): 4 mg via INTRAVENOUS
  Filled 2020-07-29 (×2): qty 2

## 2020-07-29 MED ORDER — METOCLOPRAMIDE HCL 5 MG/ML IJ SOLN
5.0000 mg | Freq: Three times a day (TID) | INTRAMUSCULAR | Status: DC | PRN
  Administered 2020-07-30 (×2): 10 mg via INTRAVENOUS
  Filled 2020-07-29 (×2): qty 2

## 2020-07-29 MED ORDER — BUPIVACAINE HCL (PF) 0.5 % IJ SOLN
INTRAMUSCULAR | Status: AC
  Filled 2020-07-29: qty 30

## 2020-07-29 MED ORDER — ONDANSETRON HCL 4 MG/2ML IJ SOLN
INTRAMUSCULAR | Status: DC | PRN
  Administered 2020-07-29: 4 mg via INTRAVENOUS

## 2020-07-29 MED ORDER — DIPHENHYDRAMINE HCL 50 MG/ML IJ SOLN: 12.5000 mg | Freq: Once | INTRAMUSCULAR | Status: AC

## 2020-07-29 MED ORDER — MENTHOL 3 MG MT LOZG: 1.0000 | LOZENGE | OROMUCOSAL | Status: DC | PRN

## 2020-07-29 MED ORDER — OXYCODONE HCL 5 MG PO TABS
5.0000 mg | ORAL_TABLET | ORAL | Status: DC | PRN
  Administered 2020-07-29 – 2020-07-30 (×2): 5 mg via ORAL
  Filled 2020-07-29 (×3): qty 1

## 2020-07-29 MED ORDER — SODIUM CHLORIDE 0.9 % IV SOLN: INTRAVENOUS | Status: DC

## 2020-07-29 MED ORDER — OXYCODONE HCL 5 MG PO TABS
5.0000 mg | ORAL_TABLET | Freq: Once | ORAL | Status: AC | PRN
Start: 2020-07-29 — End: 2020-07-29
  Administered 2020-07-29: 5 mg via ORAL

## 2020-07-29 MED ORDER — AMISULPRIDE (ANTIEMETIC) 5 MG/2ML IV SOLN
5.0000 mg | Freq: Once | INTRAVENOUS | Status: AC
  Administered 2020-07-29: 5 mg via INTRAVENOUS

## 2020-07-29 MED ORDER — METHOCARBAMOL 500 MG PO TABS
500.0000 mg | ORAL_TABLET | Freq: Four times a day (QID) | ORAL | Status: DC | PRN
  Administered 2020-07-29 – 2020-07-30 (×2): 500 mg via ORAL
  Filled 2020-07-29 (×2): qty 1

## 2020-07-29 MED ORDER — CHLORHEXIDINE GLUCONATE 0.12 % MT SOLN
OROMUCOSAL | Status: AC
  Administered 2020-07-29: 15 mL via OROMUCOSAL
  Filled 2020-07-29: qty 15

## 2020-07-29 MED ORDER — EPHEDRINE SULFATE-NACL 50-0.9 MG/10ML-% IV SOSY
PREFILLED_SYRINGE | INTRAVENOUS | Status: DC | PRN
  Administered 2020-07-29 (×2): 10 mg via INTRAVENOUS

## 2020-07-29 MED ORDER — LACTATED RINGERS IV SOLN: INTRAVENOUS | Status: DC

## 2020-07-29 MED ORDER — HYDROMORPHONE HCL 1 MG/ML IJ SOLN
0.5000 mg | INTRAMUSCULAR | Status: DC | PRN
  Administered 2020-07-30 (×2): 0.5 mg via INTRAVENOUS
  Filled 2020-07-29 (×2): qty 0.5

## 2020-07-29 MED ORDER — FENTANYL CITRATE (PF) 100 MCG/2ML IJ SOLN
INTRAMUSCULAR | Status: DC | PRN
  Administered 2020-07-29: 50 ug via INTRAVENOUS

## 2020-07-29 MED ORDER — FENTANYL CITRATE (PF) 100 MCG/2ML IJ SOLN
25.0000 ug | INTRAMUSCULAR | Status: DC | PRN
  Administered 2020-07-29 (×2): 25 ug via INTRAVENOUS

## 2020-07-29 MED ORDER — GABAPENTIN 100 MG PO CAPS
100.0000 mg | ORAL_CAPSULE | Freq: Two times a day (BID) | ORAL | Status: DC
  Administered 2020-07-29 – 2020-07-31 (×4): 100 mg via ORAL
  Filled 2020-07-29 (×4): qty 1

## 2020-07-29 MED ORDER — LISINOPRIL 10 MG PO TABS
10.0000 mg | ORAL_TABLET | Freq: Every day | ORAL | Status: DC
  Administered 2020-07-29 – 2020-07-31 (×3): 10 mg via ORAL
  Filled 2020-07-29 (×3): qty 1

## 2020-07-29 MED ORDER — TRANEXAMIC ACID-NACL 1000-0.7 MG/100ML-% IV SOLN
INTRAVENOUS | Status: DC | PRN
  Administered 2020-07-29: 1000 mg via INTRAVENOUS

## 2020-07-29 MED ORDER — ONDANSETRON HCL 4 MG PO TABS: 4.0000 mg | ORAL_TABLET | Freq: Four times a day (QID) | ORAL | Status: DC | PRN

## 2020-07-29 MED ORDER — BUPIVACAINE LIPOSOME 1.3 % IJ SUSP
10.0000 mL | Freq: Once | INTRAMUSCULAR | Status: DC
  Filled 2020-07-29: qty 10

## 2020-07-29 MED ORDER — NON FORMULARY: Freq: Two times a day (BID) | Status: DC

## 2020-07-29 MED ORDER — DIPHENHYDRAMINE HCL 50 MG/ML IJ SOLN
INTRAMUSCULAR | Status: AC
  Administered 2020-07-29: 12.5 mg via INTRAVENOUS
  Filled 2020-07-29: qty 1

## 2020-07-29 MED ORDER — 0.9 % SODIUM CHLORIDE (POUR BTL) OPTIME
TOPICAL | Status: DC | PRN
  Administered 2020-07-29: 1000 mL

## 2020-07-29 MED ORDER — HYDROMORPHONE HCL 1 MG/ML IJ SOLN
0.5000 mg | INTRAMUSCULAR | Status: DC | PRN
  Administered 2020-07-29: 0.5 mg via INTRAVENOUS

## 2020-07-29 MED ORDER — FENTANYL CITRATE (PF) 100 MCG/2ML IJ SOLN
INTRAMUSCULAR | Status: AC
  Administered 2020-07-29: 25 ug via INTRAVENOUS
  Filled 2020-07-29: qty 2

## 2020-07-29 MED ORDER — OXYCODONE HCL 5 MG/5ML PO SOLN
5.0000 mg | Freq: Once | ORAL | Status: AC | PRN
Start: 2020-07-29 — End: 2020-07-29

## 2020-07-29 MED ORDER — LEVOTHYROXINE SODIUM 25 MCG PO TABS
125.0000 ug | ORAL_TABLET | Freq: Every day | ORAL | Status: DC
  Administered 2020-07-30 – 2020-07-31 (×2): 125 ug via ORAL
  Filled 2020-07-29 (×3): qty 1

## 2020-07-29 MED ORDER — CHLORHEXIDINE GLUCONATE 0.12 % MT SOLN
15.0000 mL | Freq: Once | OROMUCOSAL | Status: AC
Start: 2020-07-29 — End: 2020-07-29

## 2020-07-29 MED ORDER — NALBUPHINE HCL 10 MG/ML IJ SOLN
5.0000 mg | Freq: Once | INTRAMUSCULAR | Status: AC
Start: 2020-07-29 — End: 2020-07-29
  Administered 2020-07-29: 5 mg via INTRAVENOUS
  Filled 2020-07-29: qty 0.5

## 2020-07-29 MED ORDER — BUPIVACAINE LIPOSOME 1.3 % IJ SUSP
INTRAMUSCULAR | Status: DC | PRN
  Administered 2020-07-29: 20 mL

## 2020-07-29 MED ORDER — DONEPEZIL HCL 10 MG PO TABS
10.0000 mg | ORAL_TABLET | Freq: Every day | ORAL | Status: DC
  Administered 2020-07-29 – 2020-07-30 (×2): 10 mg via ORAL
  Filled 2020-07-29 (×2): qty 1

## 2020-07-29 SURGICAL SUPPLY — 53 items
APL SKNCLS STERI-STRIP NONHPOA (GAUZE/BANDAGES/DRESSINGS) ×1
BENZOIN TINCTURE PRP APPL 2/3 (GAUZE/BANDAGES/DRESSINGS) ×2 IMPLANT
BLADE CLIPPER SURG (BLADE) IMPLANT
BLADE SAW SGTL 18X1.27X75 (BLADE) ×2 IMPLANT
CELLS DAT CNTRL 66122 CELL SVR (MISCELLANEOUS) ×1 IMPLANT
CLSR STERI-STRIP ANTIMIC 1/2X4 (GAUZE/BANDAGES/DRESSINGS) ×2 IMPLANT
COVER SURGICAL LIGHT HANDLE (MISCELLANEOUS) ×2 IMPLANT
COVER WAND RF STERILE (DRAPES) ×2 IMPLANT
DRAPE C-ARM 42X72 X-RAY (DRAPES) ×2 IMPLANT
DRAPE IMP U-DRAPE 54X76 (DRAPES) ×2 IMPLANT
DRAPE STERI IOBAN 125X83 (DRAPES) ×2 IMPLANT
DRAPE U-SHAPE 47X51 STRL (DRAPES) ×6 IMPLANT
DRSG MEPILEX BORDER 4X8 (GAUZE/BANDAGES/DRESSINGS) ×2 IMPLANT
DURAPREP 26ML APPLICATOR (WOUND CARE) ×2 IMPLANT
ELECT BLADE 4.0 EZ CLEAN MEGAD (MISCELLANEOUS)
ELECT CAUTERY BLADE 6.4 (BLADE) ×2 IMPLANT
ELECT REM PT RETURN 9FT ADLT (ELECTROSURGICAL) ×2
ELECTRODE BLDE 4.0 EZ CLN MEGD (MISCELLANEOUS) IMPLANT
ELECTRODE REM PT RTRN 9FT ADLT (ELECTROSURGICAL) ×1 IMPLANT
ELIMINATOR HOLE APEX DEPUY (Hips) ×2 IMPLANT
FACESHIELD WRAPAROUND (MASK) ×4 IMPLANT
GLOVE ORTHO TXT STRL SZ7.5 (GLOVE) ×4 IMPLANT
GLOVE SRG 8 PF TXTR STRL LF DI (GLOVE) ×2 IMPLANT
GLOVE SURG UNDER POLY LF SZ8 (GLOVE) ×4
GOWN STRL REUS W/ TWL LRG LVL3 (GOWN DISPOSABLE) ×1 IMPLANT
GOWN STRL REUS W/ TWL XL LVL3 (GOWN DISPOSABLE) ×1 IMPLANT
GOWN STRL REUS W/TWL 2XL LVL3 (GOWN DISPOSABLE) ×2 IMPLANT
GOWN STRL REUS W/TWL LRG LVL3 (GOWN DISPOSABLE) ×2
GOWN STRL REUS W/TWL XL LVL3 (GOWN DISPOSABLE) ×2
HEAD M SROM 36MM PLUS 1.5 (Hips) IMPLANT
KIT BASIN OR (CUSTOM PROCEDURE TRAY) ×2 IMPLANT
KIT TURNOVER KIT B (KITS) ×2 IMPLANT
LINER NEUTRAL 52X36MM PLUS 4 (Liner) ×1 IMPLANT
MANIFOLD NEPTUNE II (INSTRUMENTS) ×2 IMPLANT
NS IRRIG 1000ML POUR BTL (IV SOLUTION) ×2 IMPLANT
PACK TOTAL JOINT (CUSTOM PROCEDURE TRAY) ×2 IMPLANT
PAD ARMBOARD 7.5X6 YLW CONV (MISCELLANEOUS) ×4 IMPLANT
PIN SECTOR W/GRIP ACE CUP 52MM (Hips) ×1 IMPLANT
RTRCTR WOUND ALEXIS 18CM MED (MISCELLANEOUS) ×2
SROM M HEAD 36MM PLUS 1.5 (Hips) ×2 IMPLANT
STEM FEMORAL SZ5 HIGH ACTIS (Stem) ×1 IMPLANT
STRIP CLOSURE SKIN 1/2X4 (GAUZE/BANDAGES/DRESSINGS) ×2 IMPLANT
SUT VIC AB 0 CT1 27 (SUTURE) ×2
SUT VIC AB 0 CT1 27XBRD ANBCTR (SUTURE) ×1 IMPLANT
SUT VIC AB 2-0 CT1 27 (SUTURE) ×2
SUT VIC AB 2-0 CT1 TAPERPNT 27 (SUTURE) ×1 IMPLANT
SUT VICRYL 4-0 PS2 18IN ABS (SUTURE) ×2 IMPLANT
SUT VLOC 180 0 24IN GS25 (SUTURE) ×2 IMPLANT
TOWEL GREEN STERILE (TOWEL DISPOSABLE) ×4 IMPLANT
TOWEL GREEN STERILE FF (TOWEL DISPOSABLE) ×2 IMPLANT
TRAY CATH 16FR W/PLASTIC CATH (SET/KITS/TRAYS/PACK) IMPLANT
TRAY FOLEY MTR SLVR 16FR STAT (SET/KITS/TRAYS/PACK) IMPLANT
WATER STERILE IRR 1000ML POUR (IV SOLUTION) ×4 IMPLANT

## 2020-07-29 NOTE — Progress Notes (Signed)
PT Cancellation Note  Patient Details Name: Destiny Hughes MRN: [id number] DOB: [birth year]   Cancelled Treatment:      Pt still in PACU. Called and spoke with PACU RN who reports pt just now able to start moving toes and sensation is still decreased from Spinal.  Will plan on PT evaluation tomorrow as pt unable to participate today. Abran Richard, PT Acute Rehab Services Pager [phone] [facility] [place] 07/29/2020, 4:49 PM

## 2020-07-29 NOTE — Care Plan (Signed)
Ortho Bundle Case Management Note  Patient Details  Name: Destiny Hughes MRN: [id number] Date of Birth: [birth year]   [facility] call to patient to discuss her upcoming Right total hip replacement per Dr. Lorin Mercy on 07/29/20. She is an Ortho bundle patient through [facility] and is agreeable to case management. She lives with her husband, who will be assisting at home after discharge. Her son will also be assisting with needs. She has a FWW and elevated/handicapped toilets in the home. No DME needed. Reviewed all post-op care instructions and provided opportunity for questions. Will continue to follow for needs. Don't anticipate need for HHPT at this time, but will make referral if MD feels this is needed.            DME Arranged:   (Patient verbalized she has a FWW and handicapped toilets in the home.) DME Agency:     HH Arranged:    [place] Agency:     Additional Comments: Please contact me with any questions of if this plan should need to change.  Jamse Arn, RN, BSN, [employer]  [phone] 07/29/2020, 4:53 PM

## 2020-07-29 NOTE — Interval H&P Note (Signed)
History and Physical Interval Note:  07/29/2020 12:34 PM  Destiny Hughes  has presented today for surgery, with the diagnosis of right hip primary osteoarthritis.  The various methods of treatment have been discussed with the patient and family. After consideration of risks, benefits and other options for treatment, the patient has consented to  Procedure(s): RIGHT TOTAL HIP ARTHROPLASTY ANTERIOR APPROACH (Right) as a surgical intervention.  The patient's history has been reviewed, patient examined, no change in status, stable for surgery.  I have reviewed the patient's chart and labs.  Questions were answered to the patient's satisfaction.     Marybelle Killings

## 2020-07-29 NOTE — Op Note (Signed)
Preop diagnosis: Right hip primary osteoarthritis  Postop diagnosis: Same  Procedure: Right total hip arthroplasty, direct anterior approach.  Surgeon: Rodell Perna MD  Assistant: Benjiman Core, PA-C medically necessary and present for the entire procedure.  Anesthesia: Spinal +10 cc Marcaine +10 cc Exparel at closure.  EBL: 150 cc  ImplantsPIN SECTOR W/GRIP ACE CUP 52MM - [id number]  Inventory Item: PIN SECTOR W/GRIP ACE CUP 52MM Serial no.:  Model/Cat no.: [id number]  Implant name: PIN SECTOR W/GRIP ACE CUP 52MM - [id number] Laterality: Right Area: Hip  Manufacturer: [place] Date of Manufacture:    Action: Implanted Number Used: 1   Device Identifier:  Device Identifier TypeBrock Bad APEX DEPUY - [id number]  Inventory Item: ELIMINATOR HOLE APEX DEPUY Serial no.:  Model/Cat no.: [id number]  Implant nameBrock Bad APEX DEPUY - [id number] Laterality: Right Area: Hip  Manufacturer: [place] Date of Manufacture:    Action: Implanted Number Used: 1   Device Identifier:  Device Identifier Type:     LINER NEUTRAL 52X36MM PLUS 4 - [id number]  Inventory Item: LINER NEUTRAL 52X36MM PLUS 4 Serial no.:  Model/Cat no.: [id number]  Implant name: LINER NEUTRAL 52X36MM PLUS 4 - [id number] Laterality: Right Area: Hip  Manufacturer: [place] Date of Manufacture:    Action: Implanted Number Used: 1   Device Identifier:  Device Identifier Type:     STEM FEMORAL SZ5 HIGH ACTIS - [id number]  Inventory Item: STEM FEMORAL SZ5 HIGH ACTIS Serial no.:  Model/Cat no.: [id number]  Implant name: STEM FEMORAL SZ5 HIGH ACTIS - [id number] Laterality: Right Area: Hip  Manufacturer: [place] Date of Manufacture:    Action: Implanted Number Used: 1   Device Identifier:  Device Identifier Type:     SROM M HEAD 36MM PLUS 1.5 - [id number]  Inventory Item: SROM M HEAD 36MM PLUS 1.5 Serial no.:  Model/Cat no.: [id number]  Implant name: SROM M HEAD 36MM PLUS 1.5 -  [id number] Laterality: Right Area: Hip  Manufacturer: [place] Date of Manufacture:    Action: Implanted Number Used: 1   Device Identifier:  Device Identifier Type:     I had a long poly#5 high offset Actis stem with encryption 52 mm cup 4 mm offset poly-.  Procedure: After spinal anesthesia placement on the Hana table careful positioning Foley catheter had been placed at the beginning of the case and C-arm was brought in with good visualization.  Patient was short about 1 cm due to hip osteoarthritis.  She has a previous bipolar opposite hip.  1015 drapes were applied DuraPrep.  Standard draping was performed hydraulic arm was attached sealed large shower curtain Betadine Steri-Drape.  Timeout procedure was completed.  Incision was made with direct anterior approach at 1 finger lateral 1 inferior to the ASIS obliquely over the trochanter.  Fascia was identified nicked elevated and dull Cobra was placed over the capsule.  Transverse artery and vein was coagulated fat was excised anterior capsule was opened and the deltoid was placed inside the capsule.  Continue resection of the anterior capsule was performed and neck was cut under C-arm visualization 11 mm neck length.  Head was removed with a corkscrew.  There is deformity of the head flattening and x-ray in the last year had shown changes that may have represented possible portion of avascular necrosis.  No mobile fragment was present but there was eburnated bone in the weightbearing portion.  Labrum was excised sequential reaming up to 51.  50 cup was inserted  checked under C arm impacted and after centralizer hole was placed being secured no dome screw was necessary.  +4 mm polywas impacted check secured and was dialed in appropriate rotation.  Hydraulic arm is applied.  External rotation to 120 leg was taken down and under and posterior capsule was releasing the external rotators.  Large trochanteric retractors placed on the trochanter Mueller  medially and preparation of the canal performed progressing finally up to #4 trialing showing good restoration of neck length.  Look like the stem could slightly have a larger stem size and if I was inserted in good position apartment fire was inserted flush with the calcar after calcar reaming with good position and alignment.  +1.5 neck length restored symmetrical leg length it was impacted checked hip was reduced.  Good stability external rotation and 90 and extension halfway down to the floor 45 degrees with no subluxation there is only trace shock.  Assuming good position irrigation saline solution V-Loc closure Marcaine Exparel injected and then standard skin closure after 2-0 Vicryl.  Skin was closed with staples postop dressing applied and transfer the care room.

## 2020-07-29 NOTE — Telephone Encounter (Signed)
Note from 05/18/20 including addendum faxed to [place] attn at [phone].

## 2020-07-29 NOTE — Anesthesia Postprocedure Evaluation (Signed)
Anesthesia Post Note  Patient: Destiny Hughes  Procedure(s) Performed: RIGHT TOTAL HIP ARTHROPLASTY ANTERIOR APPROACH (Right Hip)     Patient location during evaluation: PACU Anesthesia Type: Spinal Level of consciousness: oriented and awake and alert Pain management: pain level controlled Vital Signs Assessment: post-procedure vital signs reviewed and stable Respiratory status: spontaneous breathing, respiratory function stable and patient connected to nasal cannula oxygen Cardiovascular status: blood pressure returned to baseline and stable Postop Assessment: no headache, no backache and no apparent nausea or vomiting Anesthetic complications: no   No complications documented.  Last Vitals:  Vitals:   07/29/20 1508 07/29/20 1523  BP: 93/79 125/86  Pulse: 71 62  Resp: 14 15  Temp:    SpO2: 97% 99%    Last Pain:  Vitals:   07/29/20 1508  TempSrc:   PainSc: 6                  Cleola Perryman

## 2020-07-29 NOTE — H&P (Signed)
TOTAL HIP ADMISSION H&P  Patient is admitted for right total hip arthroplasty.  Subjective:  Chief Complaint: right hip pain  HPI: Destiny Hughes, 75 y.o. female, has a history of pain and functional disability in the right hip(s) due to arthritis and patient has failed non-surgical conservative treatments for greater than 12 weeks to include NSAID's and/or analgesics, use of assistive devices and activity modification.  Onset of symptoms was gradual starting >10 years ago with gradually worsening course since that time.The patient noted no past surgery on the right hip(s).  Patient currently rates pain in the right hip at 10 out of 10 with activity. Patient has night pain, worsening of pain with activity and weight bearing, trendelenberg gait and pain that interfers with activities of daily living. Patient has evidence of subchondral sclerosis, periarticular osteophytes and joint space narrowing by imaging studies. This condition presents safety issues increasing the risk of falls. .  There is no current active infection.  Patient Active Problem List   Diagnosis Date Noted  . Diabetes mellitus ([place]) 07/08/2020  . Unilateral primary osteoarthritis, right hip 03/30/2020  . Unilateral primary osteoarthritis, left knee 03/30/2020  . History of fusion of cervical spine 07/03/2019  . Spinal cord stimulator dysfunction ([place]) 06/04/2018  . Chronic cough 05/21/2018  . Tremor 02/16/2018  . Chronic rhinitis 02/05/2018  . Hallucinations   . Psychosis ([place]) 11/07/2016  . Acute respiratory failure with hypoxia and hypercapnia ([place]) 11/03/2016  . Other chest pain 10/23/2016  . Tobacco abuse 10/23/2016  . Major depression with psychotic features ([place]) 10/19/2016  . Dementia with behavioral disturbance ([place]) 10/19/2016  . Depression due to physical illness   . Cognitive retention disorder   . Atrial flutter ([place]) 08/15/2016  . Paroxysmal atrial fibrillation ([place]) 08/15/2016  . SVT  (supraventricular tachycardia) ([place]) 07/11/2016  . Weight loss 07/08/2016  . AKI (acute kidney injury) ([place]) 07/02/2016  . Acute kidney injury ([place]) 07/01/2016  . Hyperglycemia 07/01/2016  . Hyponatremia 07/01/2016  . Intractable nausea and vomiting 07/01/2016  . Abnormal EKG 07/01/2016  . Diastolic CHF ([place]) 97/98/9211  . Medication management 04/28/2015  . Emphysema lung ([place]) 12/26/2014  . Gastroesophageal reflux disease without esophagitis 12/26/2014  . Chronic bronchitis ([place]) 11/13/2014  . COPD (chronic obstructive pulmonary disease) ([place]) 10/03/2014  . At high risk for falls 10/03/2014  . Other specified hypothyroidism 10/03/2014  . Mitral valve prolapse 10/03/2014  . Encounter for Medicare annual wellness exam 10/03/2014  . IBS (irritable bowel syndrome) 04/01/2014  . S/P total knee replacement 04/22/2013  . Knee pain, chronic 04/22/2013  . Essential hypertension 04/15/2013  . Vitamin D deficiency 04/15/2013  . Encounter for long-term (current) use of medications 04/15/2013  . Prediabetes 04/15/2013  . Hyperlipemia   . Glaucoma   . Chronic pain disorder   . Chronic back pain    Past Medical History:  Diagnosis Date  . Arthritis    OA- knees & back  neck  . Chronic back pain   . Cognitive retention disorder   . Collapse of right lung   . COPD (chronic obstructive pulmonary disease) ([facility])    Hx. Bronchitis occ, 01-25-11 somes issue now taking  Z-pak-started 01-24-11/Scar tissue  present  from previous lung collapse  . Dementia with psychosis ([place])   . Depression   . Depression   . Diabetes mellitus ([place]) 07/08/2020  . Dyspnea   . Dysrhythmia 2018   treated with Ablation   . GERD (gastroesophageal reflux disease)    tx.  TUMS  . Glaucoma   . Headache   . History of kidney stones    surgically treated  . Hyperlipemia   . Hypertension   . Hypothyroidism    tx. Levothyroxine  . Major depression with psychotic features ([place])   . Nephrolithiasis   . Neuromuscular  disorder ([place]) 01-25-11   Pain/nerve stimulator implanted -2 yrs ago.  Marland Kitchen Reflex, gag, absent   . Reflex, gag, absent     Past Surgical History:  Procedure Laterality Date  . A-FLUTTER ABLATION N/A 08/16/2016   Procedure: A-Flutter Ablation;  Surgeon: Evans Lance, MD;  Location: [facility];  Service: Cardiovascular;  Laterality: N/A;  . ANTERIOR CERVICAL DECOMP/DISCECTOMY FUSION     3 levels, per family  . APPENDECTOMY  01-25-11  . CARPAL TUNNEL RELEASE  01-25-11   Bil.  . CERVICAL SPINE SURGERY  01-25-11   2005-multiple levels  . CHOLECYSTECTOMY  01-25-11   '07  . COLONOSCOPY WITH PROPOFOL N/A 07/23/2012   Procedure: COLONOSCOPY WITH PROPOFOL;  Surgeon: Garlan Fair, MD;  Location: [facility];  Service: Endoscopy;  Laterality: N/A;  . detatched retna Bilateral    done July & August- 2019  . EYE SURGERY Bilateral 2019   repair of retina detachment- Dr. Zadie Rhine at surgical center   . JOINT REPLACEMENT  01-25-11   6'03 LTHA-hemi  . KNEE ARTHROPLASTY  01-25-11   '04-right, revised x2  . no gag reflex     Pt does not havea gag reflex following  siurgery to neck. Family unsure of date. Pt takes pill in apple sauce.  . SPINAL CORD STIMULATOR BATTERY EXCHANGE N/A 01/12/2018   Procedure: Spinal cord stimulator battery replacement;  Surgeon: Clydell Hakim, MD;  Location: [facility];  Service: Neurosurgery;  Laterality: N/A;  left  . SPINAL CORD STIMULATOR IMPLANT  2009 APPROX   DR. ELSNER  . SPINAL CORD STIMULATOR REMOVAL N/A 06/04/2018   Procedure: Removal of spinal cord stimulator, thoracic paddle and generator;  Surgeon: Kristeen Miss, MD;  Location: [facility];  Service: Neurosurgery;  Laterality: N/A;  . THORACIC SPINE SURGERY  01-25-11   6'02- then nerve stimulator implanted after  . TOTAL KNEE REVISION  02/01/2011   Procedure: TOTAL KNEE REVISION;  Surgeon: Mauri Pole;  Location: [facility];  Service: Orthopedics;  Laterality: Right;  . TUBAL LIGATION      Current  Facility-Administered Medications  Medication Dose Route Frequency Provider Last Rate Last Admin  . bupivacaine liposome (EXPAREL) 1.3 % injection 133 mg  10 mL Infiltration Once Lanae Crumbly, PA-C      . lactated ringers infusion   Intravenous Continuous Janeece Riggers, MD 10 mL/hr at 07/29/20 1122 New Bag at 07/29/20 1122  . vancomycin (VANCOCIN) IVPB 1000 mg/200 mL premix  1,000 mg Intravenous On Call to OR Lanae Crumbly, PA-C 200 mL/hr at 07/29/20 1123 1,000 mg at 07/29/20 1123   Allergies  Allergen Reactions  . Albuterol Shortness Of Breath and Other (See Comments)  . Penicillins Anaphylaxis, Swelling and Other (See Comments)    Has patient had a PCN reaction causing immediate rash, facial/tongue/throat swelling, SOB or lightheadedness with hypotension: Yes Has patient had a PCN reaction causing severe rash involving mucus membranes or skin necrosis: Rash Has patient had a PCN reaction that required hospitalization: No Has patient had a PCN reaction occurring within the last 10 years: No If all of the above answers are "NO", then may proceed with Cephalosporin use.  . Sulfa Antibiotics  Swelling and Other (See Comments)    Throat swells, but no shortness of breath noted  . Codeine Nausea And Vomiting  . Ecotrin [Aspirin] Nausea And Vomiting  . Zanaflex [Tizanidine] Other (See Comments)    Possible confusion    Social History   Tobacco Use  . Smoking status: Former Smoker    Packs/day: 0.75    Years: 50.00    Pack years: 37.50    Types: Cigarettes    Start date: 09/13/1961    Quit date: 05/2016    Years since quitting: 4.1  . Smokeless tobacco: Never Used  . Tobacco comment: Decreased Down to 2/3 a Day if Thinking [place] It  Substance Use Topics  . Alcohol use: No    Alcohol/week: 0.0 standard drinks    Comment: none in 10 years    Family History  Problem Relation Age of Onset  . Heart attack Father   . Emphysema Father   . Aneurysm Mother        CEREBRAL  .  Emphysema Mother   . Dementia Mother   . Diabetes Son   . Hypertension Son   . Hypertension Son   . Cancer Sister        Widely Metastatic  . Asthma Son        x2  . Breast cancer Paternal Aunt   . Rheum arthritis Maternal Aunt      Review of Systems  Constitutional: Positive for activity change.  HENT: Negative.   Respiratory: Positive for shortness of breath (exertional dyspnea).   Cardiovascular: Negative.   Gastrointestinal: Negative.   Genitourinary: Negative.   Musculoskeletal: Positive for gait problem.  Psychiatric/Behavioral: Negative.     Objective:  Physical Exam HENT:     Head: Normocephalic.  Eyes:     Extraocular Movements: Extraocular movements intact.  Cardiovascular:     Heart sounds: Normal heart sounds.  Pulmonary:     Effort: No respiratory distress.  Musculoskeletal:        General: Tenderness present.  Neurological:     Mental Status: She is alert and oriented to person, place, and time.  Psychiatric:        Mood and Affect: Mood normal.     Vital signs in last 24 hours: Temp:  [97.6 F (36.4 C)] 97.6 F (36.4 C) (05/11 1042) Pulse Rate:  [62] 62 (05/11 1042) Resp:  [17] 17 (05/11 1042) BP: (150)/(69) 150/69 (05/11 1042) SpO2:  [96 %] 96 % (05/11 1042) Weight:  [82.6 kg] 82.6 kg (05/11 1042)  Labs:   Estimated body mass index is 29.83 kg/m as calculated from the following:   Height as of this encounter: 5' 5.5" (1.664 m).   Weight as of this encounter: 82.6 kg.   Imaging Review Plain radiographs demonstrate moderate degenerative joint disease of the right hip(s). The bone quality appears to be good for age and reported activity level.      Assessment/Plan:  End stage arthritis, right hip(s)  The patient history, physical examination, clinical judgement of the provider and imaging studies are consistent with end stage degenerative joint disease of the right hip(s) and total hip arthroplasty is deemed medically necessary.  The treatment options including medical management, injection therapy, arthroscopy and arthroplasty were discussed at length. The risks and benefits of total hip arthroplasty were presented and reviewed. The risks due to aseptic loosening, infection, stiffness, dislocation/subluxation,  thromboembolic complications and other imponderables were discussed.  The patient acknowledged the explanation, agreed to proceed with the  plan and consent was signed. Patient is being admitted for inpatient treatment for surgery, pain control, PT, OT, prophylactic antibiotics, VTE prophylaxis, progressive ambulation and ADL's and discharge planning.The patient is planning to be discharged home with home health services

## 2020-07-29 NOTE — Plan of Care (Addendum)
Patient had multiple episodes of vomiting throughout the night. Moderate amount, tan to clear sputum.    Problem: Education: Goal: Knowledge of General Education information will improve Description: Including pain rating scale, medication(s)/side effects and non-pharmacologic comfort measures Outcome: Progressing   Problem: Activity: Goal: Risk for activity intolerance will decrease Outcome: Progressing   Problem: Pain Managment: Goal: General experience of comfort will improve Outcome: Progressing   Problem: Safety: Goal: Ability to remain free from injury will improve Outcome: Progressing   Problem: Skin Integrity: Goal: Risk for impaired skin integrity will decrease Outcome: Progressing

## 2020-07-29 NOTE — Transfer of Care (Signed)
Immediate Anesthesia Transfer of Care Note  Patient: Destiny Hughes  Procedure(s) Performed: RIGHT TOTAL HIP ARTHROPLASTY ANTERIOR APPROACH (Right Hip)  Patient Location: PACU  Anesthesia Type:MAC and Spinal  Level of Consciousness: drowsy  Airway & Oxygen Therapy: Patient Spontanous Breathing and Patient connected to face mask oxygen  Post-op Assessment: Report given to RN and Post -op Vital signs reviewed and stable  Post vital signs: Reviewed and stable  Last Vitals:  Vitals Value Taken Time  BP 107/52 07/29/20 1453  Temp    Pulse 65 07/29/20 1455  Resp 19 07/29/20 1455  SpO2 100 % 07/29/20 1455  Vitals shown include unvalidated device data.  Last Pain:  Vitals:   07/29/20 1102  TempSrc:   PainSc: 8       Patients Stated Pain Goal: 4 (25/48/62 8241)  Complications: No complications documented.

## 2020-07-29 NOTE — Discharge Instructions (Addendum)

## 2020-07-29 NOTE — Anesthesia Preprocedure Evaluation (Signed)
Anesthesia Evaluation  Patient identified by MRN, date of birth, ID band Patient awake    Reviewed: Allergy & Precautions, NPO status , Patient's Chart, lab work & pertinent test results  Airway Mallampati: II  TM Distance: >3 FB Neck ROM: Full    Dental no notable dental hx. (+) Poor Dentition, Chipped, Missing   Pulmonary neg pulmonary ROS, COPD,  COPD inhaler, former smoker,    Pulmonary exam normal breath sounds clear to auscultation       Cardiovascular hypertension, Pt. on medications and Pt. on home beta blockers negative cardio ROS Normal cardiovascular exam Rhythm:Regular Rate:Normal     Neuro/Psych  Headaches, PSYCHIATRIC DISORDERS Depression Schizophrenia Dementia  Neuromuscular disease negative neurological ROS  negative psych ROS   GI/Hepatic negative GI ROS, Neg liver ROS,   Endo/Other  negative endocrine ROSdiabetesHypothyroidism   Renal/GU negative Renal ROS  negative genitourinary   Musculoskeletal negative musculoskeletal ROS (+) Arthritis , Osteoarthritis,    Abdominal   Peds negative pediatric ROS (+)  Hematology negative hematology ROS (+)   Anesthesia Other Findings   Reproductive/Obstetrics negative OB ROS                           Anesthesia Physical Anesthesia Plan  ASA: III  Anesthesia Plan: Spinal   Post-op Pain Management:    Induction: Intravenous  PONV Risk Score and Plan: 2 and Treatment may vary due to age or medical condition  Airway Management Planned: Nasal Cannula and Natural Airway  Additional Equipment: None  Intra-op Plan:   Post-operative Plan:   Informed Consent:   Plan Discussed with: Anesthesiologist and CRNA  Anesthesia Plan Comments: (  )        Anesthesia Quick Evaluation

## 2020-07-29 NOTE — Telephone Encounter (Signed)
I wrote an addendum to the patient's most recent note to clarify her operative risk.

## 2020-07-30 ENCOUNTER — Encounter (HOSPITAL_COMMUNITY): Payer: Self-pay | Admitting: Orthopaedic Surgery

## 2020-07-30 DIAGNOSIS — Z87891 Personal history of nicotine dependence: Secondary | ICD-10-CM | POA: Diagnosis not present

## 2020-07-30 DIAGNOSIS — M1611 Unilateral primary osteoarthritis, right hip: Secondary | ICD-10-CM | POA: Diagnosis present

## 2020-07-30 DIAGNOSIS — F0390 Unspecified dementia without behavioral disturbance: Secondary | ICD-10-CM | POA: Diagnosis present

## 2020-07-30 DIAGNOSIS — I341 Nonrheumatic mitral (valve) prolapse: Secondary | ICD-10-CM | POA: Diagnosis present

## 2020-07-30 DIAGNOSIS — G8929 Other chronic pain: Secondary | ICD-10-CM | POA: Diagnosis present

## 2020-07-30 DIAGNOSIS — Z9181 History of falling: Secondary | ICD-10-CM | POA: Diagnosis not present

## 2020-07-30 DIAGNOSIS — M25751 Osteophyte, right hip: Secondary | ICD-10-CM | POA: Diagnosis present

## 2020-07-30 DIAGNOSIS — M549 Dorsalgia, unspecified: Secondary | ICD-10-CM | POA: Diagnosis present

## 2020-07-30 DIAGNOSIS — J439 Emphysema, unspecified: Secondary | ICD-10-CM | POA: Diagnosis present

## 2020-07-30 DIAGNOSIS — Z87442 Personal history of urinary calculi: Secondary | ICD-10-CM | POA: Diagnosis not present

## 2020-07-30 DIAGNOSIS — I480 Paroxysmal atrial fibrillation: Secondary | ICD-10-CM | POA: Diagnosis present

## 2020-07-30 DIAGNOSIS — Z9851 Tubal ligation status: Secondary | ICD-10-CM | POA: Diagnosis not present

## 2020-07-30 DIAGNOSIS — Z79890 Hormone replacement therapy (postmenopausal): Secondary | ICD-10-CM | POA: Diagnosis not present

## 2020-07-30 DIAGNOSIS — I110 Hypertensive heart disease with heart failure: Secondary | ICD-10-CM | POA: Diagnosis present

## 2020-07-30 DIAGNOSIS — Z20822 Contact with and (suspected) exposure to covid-19: Secondary | ICD-10-CM | POA: Diagnosis present

## 2020-07-30 DIAGNOSIS — E119 Type 2 diabetes mellitus without complications: Secondary | ICD-10-CM | POA: Diagnosis present

## 2020-07-30 DIAGNOSIS — I5032 Chronic diastolic (congestive) heart failure: Secondary | ICD-10-CM | POA: Diagnosis present

## 2020-07-30 DIAGNOSIS — E785 Hyperlipidemia, unspecified: Secondary | ICD-10-CM | POA: Diagnosis present

## 2020-07-30 DIAGNOSIS — H409 Unspecified glaucoma: Secondary | ICD-10-CM | POA: Diagnosis present

## 2020-07-30 DIAGNOSIS — E038 Other specified hypothyroidism: Secondary | ICD-10-CM | POA: Diagnosis present

## 2020-07-30 DIAGNOSIS — K219 Gastro-esophageal reflux disease without esophagitis: Secondary | ICD-10-CM | POA: Diagnosis present

## 2020-07-30 DIAGNOSIS — Z87892 Personal history of anaphylaxis: Secondary | ICD-10-CM | POA: Diagnosis not present

## 2020-07-30 DIAGNOSIS — Z981 Arthrodesis status: Secondary | ICD-10-CM | POA: Diagnosis not present

## 2020-07-30 DIAGNOSIS — Z79899 Other long term (current) drug therapy: Secondary | ICD-10-CM | POA: Diagnosis not present

## 2020-07-30 LAB — BASIC METABOLIC PANEL
Anion gap: 7 (ref 5–15)
BUN: 8 mg/dL (ref 8–23)
CO2: 32 mmol/L (ref 22–32)
Calcium: 8.6 mg/dL — ABNORMAL LOW (ref 8.9–10.3)
Chloride: 90 mmol/L — ABNORMAL LOW (ref 98–111)
Creatinine, Ser: 0.92 mg/dL (ref 0.44–1.00)
GFR, Estimated: >60 mL/min (ref >60)
Glucose, Bld: 238 mg/dL — ABNORMAL HIGH (ref 70–99)
Potassium: 4.2 mmol/L (ref 3.5–5.1)
Sodium: 129 mmol/L — ABNORMAL LOW (ref 135–145)

## 2020-07-30 LAB — CBC
HCT: 33.7 % — ABNORMAL LOW (ref 36.0–46.0)
Hemoglobin: 11.3 g/dL — ABNORMAL LOW (ref 12.0–15.0)
MCH: 29.8 pg (ref 26.0–34.0)
MCHC: 33.5 g/dL (ref 30.0–36.0)
MCV: 88.9 fL (ref 80.0–100.0)
Platelets: 321 10*3/uL (ref 150–400)
RBC: 3.79 MIL/uL — ABNORMAL LOW (ref 3.87–5.11)
RDW: 13.2 % (ref 11.5–15.5)
WBC: 9.8 10*3/uL (ref 4.0–10.5)
nRBC: 0 % (ref 0.0–0.2)

## 2020-07-30 NOTE — Progress Notes (Signed)
Physical Therapy Treatment Patient Details Name: Destiny Hughes MRN: [id number] DOB: 07/13/1945 Today's Date: 07/30/2020    History of Present Illness The pt is a 75 yo female presenting 5/11 for R THA due to R hip osteoarthritis. PMH includes: TKA 2012 with 2 revisions; L THA hemi; cervical spine surgery (ACDF); back and knee OA; dementia; COPD; dyspnea; HTN; atrial flutter ablation; thoracic spine surgery; and spinal stimulator.    PT Comments    The pt was seen this afternoon for continued progression of mobility and HEP following THA. The pt remains eager to mobilize and return home. The pt was able to complete multiple sit-stand transfers from EOB with minG for safety and use of RW, but did require minA when standing from low surface in the hall. The pt was able to progress ambulation to include 20 ft hallway ambulation on RA, but desaturated to 84% on RA, and required donning of 2L O2 to recover with seated rest to 90s. The pt was then able to maintain SpO2 in 90s for 20 ft ambulation back to room. The pt states she will have all needed assistance upon return home, discussed navigation of stairs, energy conservation, self-monitoring of exertion, and both walking and LE exercise HEP. Pt verbalized understanding of all education, will continue to benefit from skilled PT to progress functional power, endurance, and stability.    SATURATION QUALIFICATIONS: (This note is used to comply with regulatory documentation for home oxygen)  Patient Saturations on Room Air at Rest = 95%  Patient Saturations on Room Air while Ambulating = 84%  Patient Saturations on 2 Liters of oxygen while Ambulating = 95%  Please briefly explain why patient needs home oxygen: Pt unable to maintain SpO2 in 90s with activity.    Follow Up Recommendations  Home health PT;Supervision for mobility/OOB     Equipment Recommendations  None recommended by PT    Recommendations for Other Services        Precautions / Restrictions Precautions Precautions: Fall Precaution Comments: watch SpO2 Restrictions Weight Bearing Restrictions: Yes RLE Weight Bearing: Weight bearing as tolerated    Mobility  Bed Mobility Overal bed mobility: Needs Assistance Bed Mobility: Sit to Supine       Sit to supine: Min assist   General bed mobility comments: minA to RLE to raise into bed, pt able to reposition without assist    Transfers Overall transfer level: Needs assistance Equipment used: Rolling walker (2 wheeled) Transfers: Sit to/from Stand Sit to Stand: Min guard;Min assist         General transfer comment: minG with cues for hand placement to power up from EOB, minA to power up from low surface  Ambulation/Gait Ambulation/Gait assistance: Min guard Gait Distance (Feet): 20 Feet (x2) Assistive device: Rolling walker (2 wheeled) Gait Pattern/deviations: Step-through pattern;Decreased stride length;Decreased weight shift to right;Decreased stance time - right;Antalgic Gait velocity: decreased Gait velocity interpretation: <1.31 ft/sec, indicative of household ambulator General Gait Details: small steps with minimal wt shift to R, antalgic pattern with increased reliance on BUE support, no LOB. SpO2 maintained on RA with gait, desat to 84% with good pleth after 20 ft, pt asked for seated rest break. added 2L O2 and pt was able to recover to 90s with 3 min seated rest. ambulated 20 ft back to bed on 2L with SpO2 in 90s         Balance Overall balance assessment: Needs assistance Sitting-balance support: No upper extremity supported;Feet supported Sitting balance-Leahy Scale: Good  Standing balance support: Bilateral upper extremity supported Standing balance-Leahy Scale: Poor Standing balance comment: reliant on BUE for dynamic standing                            Cognition Arousal/Alertness: Awake/alert Behavior During Therapy: WFL for tasks  assessed/performed Overall Cognitive Status: Within Functional Limits for tasks assessed                                        Exercises Total Joint Exercises Long Arc Quad: AROM;Both;10 reps;Seated Marching in Standing: AROM;Both;10 reps;Standing    General Comments General comments (skin integrity, edema, etc.): SpO2 in 90s on RA initially, slow but steady desat with ambulation to 84% after 20 ft. Pt given 2L O2 and recovered to 90s with 3 min seated rest break. maintained SpO2 in 90s with ambulaiton on 2L      Pertinent Vitals/Pain Pain Assessment: Faces Faces Pain Scale: Hurts little more Pain Location: R hip and down R thigh Pain Descriptors / Indicators: Discomfort;Aching Pain Intervention(s): Limited activity within patient's tolerance;Monitored during session;Repositioned           PT Goals (current goals can now be found in the care plan section) Acute Rehab PT Goals Patient Stated Goal: return home PT Goal Formulation: With patient Time For Goal Achievement: 08/06/20 Potential to Achieve Goals: Good Progress towards PT goals: Progressing toward goals    Frequency    7X/week      PT Plan Current plan remains appropriate       AM-PAC PT "6 Clicks" Mobility   Outcome Measure  Help needed turning from your back to your side while in a flat bed without using bedrails?: None Help needed moving from lying on your back to sitting on the side of a flat bed without using bedrails?: A Little Help needed moving to and from a bed to a chair (including a wheelchair)?: A Little Help needed standing up from a chair using your arms (e.g., wheelchair or bedside chair)?: A Little Help needed to walk in hospital room?: A Little Help needed climbing 3-5 steps with a railing? : A Little 6 Click Score: 19    End of Session Equipment Utilized During Treatment: Gait belt;Oxygen Activity Tolerance: Patient tolerated treatment well Patient left: with call  bell/phone within reach;with chair alarm set;in bed Nurse Communication: Mobility status (O2 sats) PT Visit Diagnosis: Other abnormalities of gait and mobility (R26.89)     Time: 1444-1510 PT Time Calculation (min) (ACUTE ONLY): 26 min  Charges:  $Gait Training: 8-22 mins $Therapeutic Exercise: 8-22 mins                     Karma Ganja, PT, DPT   Acute Rehabilitation Department Pager #: [phone]   Otho Bellows 07/30/2020, 3:34 PM

## 2020-07-30 NOTE — Progress Notes (Signed)
   Subjective: 1 Day Post-Op Procedure(s) (LRB): RIGHT TOTAL HIP ARTHROPLASTY ANTERIOR APPROACH (Right) Patient reports pain as mild and moderate.    Objective: Vital signs in last 24 hours: Temp:  [97.4 F (36.3 C)-97.9 F (36.6 C)] 97.4 F (36.3 C) (05/12 0457) Pulse Rate:  [51-74] 66 (05/12 0457) Resp:  [10-22] 16 (05/12 0457) BP: (93-165)/(47-91) 148/62 (05/12 0457) SpO2:  [91 %-100 %] 98 % (05/12 0457) Weight:  [82.6 kg] 82.6 kg (05/11 1042)  Intake/Output from previous day: 05/11 0701 - 05/12 0700 In: 1300 [I.V.:1000; IV Piggyback:300] Out: 2850 [Urine:2800; Blood:50] Intake/Output this shift: No intake/output data recorded.  Recent Labs    07/27/20 1512 07/30/20 0234  HGB 13.5 11.3*   Recent Labs    07/27/20 1512 07/30/20 0234  WBC 6.7 9.8  RBC 4.53 3.79*  HCT 40.6 33.7*  PLT 379 321   Recent Labs    07/27/20 1512 07/30/20 0234  NA 131* 129*  K 4.4 4.2  CL 95* 90*  CO2 28 32  BUN 17 8  CREATININE 0.99 0.92  GLUCOSE 140* 238*  CALCIUM 9.0 8.6*   No results for input(s): LABPT, INR in the last 72 hours.  Neurologically intact, dressing intact  DG C-Arm 1-60 Min  Result Date: 07/29/2020 CLINICAL DATA:  Right hip arthroplasty. EXAM: OPERATIVE RIGHT HIP (WITH PELVIS IF PERFORMED) TECHNIQUE: Fluoroscopic spot image(s) were submitted for interpretation post-operatively. COMPARISON:  None. FINDINGS: Two fluoroscopic spot views of the pelvis and right hip obtained in the operating room. Right hip arthroplasty in expected alignment. Fluoroscopy time 16 seconds. Dose 1.63 mGy. IMPRESSION: Procedural fluoroscopy for right hip arthroplasty. Electronically Signed   By: Keith Rake M.D.   On: 07/29/2020 15:17   DG Hip Port Unilat With Pelvis 1V Right  Result Date: 07/29/2020 CLINICAL DATA:  Status post right hip arthroplasty. EXAM: DG HIP (WITH OR WITHOUT PELVIS) 1V PORT RIGHT COMPARISON:  06/04/2008 FINDINGS: Right hip arthroplasty in expected alignment. No  periprosthetic lucency or fracture. Recent postsurgical change includes air and edema in the soft tissues and lateral skin staples. Left hip arthroplasty is intact were visualized. IMPRESSION: Right hip arthroplasty without immediate postoperative complication. Electronically Signed   By: Keith Rake M.D.   On: 07/29/2020 16:12   DG HIP OPERATIVE UNILAT WITH PELVIS RIGHT  Result Date: 07/29/2020 CLINICAL DATA:  Right hip arthroplasty. EXAM: OPERATIVE RIGHT HIP (WITH PELVIS IF PERFORMED) TECHNIQUE: Fluoroscopic spot image(s) were submitted for interpretation post-operatively. COMPARISON:  None. FINDINGS: Two fluoroscopic spot views of the pelvis and right hip obtained in the operating room. Right hip arthroplasty in expected alignment. Fluoroscopy time 16 seconds. Dose 1.63 mGy. IMPRESSION: Procedural fluoroscopy for right hip arthroplasty. Electronically Signed   By: Keith Rake M.D.   On: 07/29/2020 15:17    Assessment/Plan: 1 Day Post-Op Procedure(s) (LRB): RIGHT TOTAL HIP ARTHROPLASTY ANTERIOR APPROACH (Right) Up with therapy.  Home possible late today , likely tomorrow.   Destiny Hughes 07/30/2020, 7:49 AM

## 2020-07-30 NOTE — Evaluation (Signed)
Physical Therapy Evaluation Patient Details Name: Destiny Hughes MRN: [id number] DOB: 1945-08-12 Today's Date: 07/30/2020   History of Present Illness  The pt is a 75 yo female presenting 5/11 for R THA due to R hip osteoarthritis. PMH includes: TKA 2012 with 2 revisions; L THA hemi; cervical spine surgery (ACDF); back and knee OA; dementia; COPD; dyspnea; HTN; atrial flutter ablation; thoracic spine surgery; and spinal stimulator.    Clinical Impression  Pt in bed upon arrival of PT, agreeable to evaluation at this time. Prior to admission the pt was independent with use of no AD in her home, but use of SPC for community distances, living with her husband in a home with 2 steps to enter. The pt now presents with limitations in functional mobility, strength, ROM in RLE, power, and activity tolerance due to above dx, and will continue to benefit from skilled PT to address these deficits. The pt was able to complete sit-stand from EOB with supervision only and VC for hand positioning with RW. The pt was then able to complete short bout of ambulation on RA with cues for guided breathing and supervision for safety with no overt LOB. However, upon return to sitting, pt desaturated to SpO2 of 83% on RA, and was returned to 2L O2 with 3 min of seated rest to recover to 90s. The RN was alerted, pt resting in recliner at end of session with IS. Recommend continued skilled PT to progress functional endurance and stability as well as to reduce reliance on O2 following return home.      Follow Up Recommendations Home health PT;Supervision for mobility/OOB    Equipment Recommendations  None recommended by PT    Recommendations for Other Services       Precautions / Restrictions Precautions Precautions: Fall Precaution Comments: watch SpO2 Restrictions Weight Bearing Restrictions: Yes RLE Weight Bearing: Weight bearing as tolerated      Mobility  Bed Mobility Overal bed mobility: Needs  Assistance Bed Mobility: Supine to Sit     Supine to sit: Min assist;HOB elevated     General bed mobility comments: minA to RLE, pt able to use LLE and BUE on bed rail to complete transition to sitting and scoot to EOB    Transfers Overall transfer level: Needs assistance Equipment used: Rolling walker (2 wheeled) Transfers: Sit to/from Stand Sit to Stand: Min guard         General transfer comment: minG initially for safety, progressed to supervision. verbal cues for hand positioning  Ambulation/Gait Ambulation/Gait assistance: Min guard Gait Distance (Feet): 15 Feet Assistive device: Rolling walker (2 wheeled) Gait Pattern/deviations: Step-through pattern;Decreased stride length;Decreased weight shift to right;Decreased stance time - right;Antalgic Gait velocity: decreased Gait velocity interpretation: <1.31 ft/sec, indicative of household ambulator General Gait Details: small steps with minimal wt shift to R, antalgic pattern with increased reliance on BUE support, no LOB. SpO2 maintained on RA with gait, desat to 83% with good pleth upon return to sitting.      Balance Overall balance assessment: Needs assistance Sitting-balance support: No upper extremity supported;Feet supported Sitting balance-Leahy Scale: Good     Standing balance support: Bilateral upper extremity supported Standing balance-Leahy Scale: Poor Standing balance comment: reliant on BUE for dynamic standing                             Pertinent Vitals/Pain Pain Assessment: 0-10 Pain Score: 8  Pain Location: R hip and down  R thigh Pain Descriptors / Indicators: Discomfort;Aching Pain Intervention(s): Repositioned;Premedicated before session;Monitored during session;Limited activity within patient's tolerance    Home Living Family/patient expects to be discharged to:: Private residence Living Arrangements: Spouse/significant other Available Help at Discharge: Family;Available 24  hours/day (not able to physically assist) Type of Home: House Home Access: Stairs to enter Entrance Stairs-Rails: Right;Left;Can reach both Entrance Stairs-Number of Steps: 2 Home Layout: One level Home Equipment: Walker - 2 wheels;Cane - single point;Shower seat;Grab bars - tub/shower;Bedside commode      Prior Function Level of Independence: Independent with assistive device(s)         Comments: no AD in the home, [facility] for outside home. husband assists with IADLs, pt reports independence with ADLs.     Hand Dominance   Dominant Hand: Right    Extremity/Trunk Assessment   Upper Extremity Assessment Upper Extremity Assessment: Overall WFL for tasks assessed    Lower Extremity Assessment Lower Extremity Assessment: Generalized weakness;RLE deficits/detail RLE Deficits / Details: 4/5 at ankle, 3-/5 knee extension and hip flexion (due to pain), 4/5 knee flexion. pt reports diminished sensation on bottom of R foot which is new RLE: Unable to fully assess due to pain RLE Sensation: decreased light touch RLE Coordination: WNL    Cervical / Trunk Assessment Cervical / Trunk Assessment: Normal  Communication   Communication: No difficulties  Cognition Arousal/Alertness: Awake/alert Behavior During Therapy: WFL for tasks assessed/performed Overall Cognitive Status: Within Functional Limits for tasks assessed                                        General Comments General comments (skin integrity, edema, etc.): VSS on RA sitting EOB at start of session. Pt able to maintain 89-94% with good pleth on RA while walking in room. However, upon returning to sit pt desat to 83% with good pleth, returned to 2L with guided breathing to recover (~64min). RN notified    Exercises     Assessment/Plan    PT Assessment Patient needs continued PT services  PT Problem List Decreased strength;Decreased range of motion;Decreased activity tolerance;Decreased balance;Decreased  mobility;Decreased safety awareness;Cardiopulmonary status limiting activity       PT Treatment Interventions DME instruction;Gait training;Stair training;Functional mobility training;Therapeutic activities;Balance training;Therapeutic exercise;Neuromuscular re-education;Patient/family education    PT Goals (Current goals can be found in the Care Plan section)  Acute Rehab PT Goals Patient Stated Goal: return home PT Goal Formulation: With patient Time For Goal Achievement: 08/06/20 Potential to Achieve Goals: Good    Frequency 7X/week    AM-PAC PT "6 Clicks" Mobility  Outcome Measure Help needed turning from your back to your side while in a flat bed without using bedrails?: None Help needed moving from lying on your back to sitting on the side of a flat bed without using bedrails?: A Little Help needed moving to and from a bed to a chair (including a wheelchair)?: A Little Help needed standing up from a chair using your arms (e.g., wheelchair or bedside chair)?: A Little Help needed to walk in hospital room?: A Little Help needed climbing 3-5 steps with a railing? : A Little 6 Click Score: 19    End of Session Equipment Utilized During Treatment: Gait belt;Oxygen Activity Tolerance: Patient tolerated treatment well Patient left: in chair;with call bell/phone within reach;with chair alarm set Nurse Communication: Mobility status (O2 sats) PT Visit Diagnosis: Other abnormalities of gait and  mobility (R26.89)    Time: 4765-4650 PT Time Calculation (min) (ACUTE ONLY): 40 min   Charges:   PT Evaluation $PT Eval Low Complexity: 1 Low PT Treatments $Gait Training: 8-22 mins $Therapeutic Activity: 8-22 mins        Karma Ganja, PT, DPT   Acute Rehabilitation Department Pager #: [phone]  Otho Bellows 07/30/2020, 10:02 AM

## 2020-07-31 LAB — CBC
HCT: 31.3 % — ABNORMAL LOW (ref 36.0–46.0)
Hemoglobin: 10.6 g/dL — ABNORMAL LOW (ref 12.0–15.0)
MCH: 29.9 pg (ref 26.0–34.0)
MCHC: 33.9 g/dL (ref 30.0–36.0)
MCV: 88.4 fL (ref 80.0–100.0)
Platelets: 269 10*3/uL (ref 150–400)
RBC: 3.54 MIL/uL — ABNORMAL LOW (ref 3.87–5.11)
RDW: 13.3 % (ref 11.5–15.5)
WBC: 8.1 10*3/uL (ref 4.0–10.5)
nRBC: 0 % (ref 0.0–0.2)

## 2020-07-31 MED ORDER — HYDROCODONE-ACETAMINOPHEN 5-325 MG PO TABS: 1.0000 | ORAL_TABLET | Freq: Four times a day (QID) | ORAL | 0 refills | Status: DC | PRN

## 2020-07-31 MED ORDER — ASPIRIN 325 MG PO TBEC: 325.0000 mg | DELAYED_RELEASE_TABLET | Freq: Every day | ORAL | 0 refills | Status: DC

## 2020-07-31 NOTE — Progress Notes (Signed)
   Subjective: 2 Days Post-Op Procedure(s) (LRB): RIGHT TOTAL HIP ARTHROPLASTY ANTERIOR APPROACH (Right) Patient reports pain as mild.  No dypnea, PT , resp therapy, RN notes reviewed. Despite sat drop to 84% pt does not want HH O2.   Objective: Vital signs in last 24 hours: Temp:  [97.3 F (36.3 C)-98.3 F (36.8 C)] 98.3 F (36.8 C) (05/13 0700) Pulse Rate:  [59-72] 69 (05/13 0700) Resp:  [18] 18 (05/12 1927) BP: (115-143)/(46-82) 136/63 (05/13 0700) SpO2:  [93 %-98 %] 93 % (05/13 0700)  Intake/Output from previous day: 05/12 0701 - 05/13 0700 In: 655.7 [I.V.:655.7] Out: -  Intake/Output this shift: Total I/O In: 240 [P.O.:240] Out: -   Recent Labs    07/30/20 0234 07/31/20 0348  HGB 11.3* 10.6*   Recent Labs    07/30/20 0234 07/31/20 0348  WBC 9.8 8.1  RBC 3.79* 3.54*  HCT 33.7* 31.3*  PLT 321 269   Recent Labs    07/30/20 0234  NA 129*  K 4.2  CL 90*  CO2 32  BUN 8  CREATININE 0.92  GLUCOSE 238*  CALCIUM 8.6*   No results for input(s): LABPT, INR in the last 72 hours.  Neurologically intact No results found.  Assessment/Plan: 2 Days Post-Op Procedure(s) (LRB): RIGHT TOTAL HIP ARTHROPLASTY ANTERIOR APPROACH (Right) Plan: discharge home . Office one week. Sat drop will improve as Hgb rebounds over a couple weeks. She will be careful, walk short distances and rest. Discussed by PT extensively with her about not overexertion.  Destiny Hughes 07/31/2020, 9:49 AM

## 2020-07-31 NOTE — Progress Notes (Signed)
Pt was given her AVS discharge summary and went over with her and her husband. States she has a walker at home to use. IV was removed with catheter intact. Pt also stated she did not need oxygen for home use as she did not use oxygen before. Pt had no further questions.

## 2020-07-31 NOTE — Plan of Care (Signed)
  Problem: Education: Goal: Knowledge of General Education information will improve Description: Including pain rating scale, medication(s)/side effects and non-pharmacologic comfort measures Outcome: Adequate for Discharge   Problem: Health Behavior/Discharge Planning: Goal: Ability to manage health-related needs will improve Outcome: Adequate for Discharge   Problem: Clinical Measurements: Goal: Ability to maintain clinical measurements within normal limits will improve Outcome: Adequate for Discharge Goal: Will remain free from infection Outcome: Adequate for Discharge Goal: Diagnostic test results will improve Outcome: Adequate for Discharge Goal: Respiratory complications will improve Outcome: Adequate for Discharge Goal: Cardiovascular complication will be avoided Outcome: Adequate for Discharge   Problem: Activity: Goal: Risk for activity intolerance will decrease Outcome: Adequate for Discharge   Problem: Nutrition: Goal: Adequate nutrition will be maintained Outcome: Adequate for Discharge   Problem: Coping: Goal: Level of anxiety will decrease Outcome: Adequate for Discharge   Problem: Elimination: Goal: Will not experience complications related to bowel motility Outcome: Adequate for Discharge Goal: Will not experience complications related to urinary retention Outcome: Adequate for Discharge   Problem: Pain Managment: Goal: General experience of comfort will improve Outcome: Adequate for Discharge   Problem: Safety: Goal: Ability to remain free from injury will improve Outcome: Adequate for Discharge   Problem: Skin Integrity: Goal: Risk for impaired skin integrity will decrease Outcome: Adequate for Discharge   Problem: Acute Rehab PT Goals(only PT should resolve) Goal: Pt Will Go Supine/Side To Sit Outcome: Adequate for Discharge Goal: Patient Will Transfer Sit To/From Stand Outcome: Adequate for Discharge Goal: Pt Will Ambulate Outcome: Adequate  for Discharge Goal: Pt Will Go Up/Down Stairs Outcome: Adequate for Discharge Goal: Pt/caregiver will Perform Home Exercise Program Outcome: Adequate for Discharge

## 2020-07-31 NOTE — Plan of Care (Addendum)
Patient desats when off oxygen. Patient went to [facility] multiple times throughout the night. Tolerated mobility well. Patient is forgetful and unable to remember to call out when needing to use the bathroom.    Problem: Education: Goal: Knowledge of General Education information will improve Description: Including pain rating scale, medication(s)/side effects and non-pharmacologic comfort measures Outcome: Progressing   Problem: Activity: Goal: Risk for activity intolerance will decrease Outcome: Progressing   Problem: Pain Managment: Goal: General experience of comfort will improve Outcome: Progressing   Problem: Safety: Goal: Ability to remain free from injury will improve Outcome: Progressing   Problem: Skin Integrity: Goal: Risk for impaired skin integrity will decrease Outcome: Progressing

## 2020-07-31 NOTE — Progress Notes (Signed)
Brovana not loaded in either pyxis or in pt's med bin. Rx paged.

## 2020-07-31 NOTE — Progress Notes (Signed)
Physical Therapy Treatment Patient Details Name: Destiny Hughes MRN: [id number] DOB: 1945/08/25 Today's Date: 07/31/2020    History of Present Illness The pt is a 75 yo female presenting 5/11 for R THA due to R hip osteoarthritis. PMH includes: TKA 2012 with 2 revisions; L THA hemi; cervical spine surgery (ACDF); back and knee OA; dementia; COPD; dyspnea; HTN; atrial flutter ablation; thoracic spine surgery; and spinal stimulator.    PT Comments    Patient progressing well towards PT goals. Continues to report pain in right hip especially with mobility. Requires Min guard-supervision for transfers, gait training and stair training with use of RW for support. Pt reports STM deficits at baseline so does not recall HEP. Reviewed there ex. Noted to have a drop in Sp02 with activity. Sp02 88% on RA at rest, drops to 84% with activity; donned 2L/min 02 [place] and able to maintain Sp02 >91% with walking. Will need supplemental 02 for home. Encouraged having spouse assist with stair training to enter home initially. Safe to d/c home with family support this afternoon. Will follow if still in the hospital.  SATURATION QUALIFICATIONS: (This note is used to comply with regulatory documentation for home oxygen)  Patient Saturations on Room Air at Rest = 88%  Patient Saturations on Room Air while Ambulating = 84%  Patient Saturations on 2 Liters of oxygen while Ambulating = 92%  Please briefly explain why patient needs home oxygen: Pt not able to maintain Sp02 on RA during activity requiring supplemental 02.    Follow Up Recommendations  Home health PT;Supervision for mobility/OOB;Follow surgeon's recommendation for DC plan and follow-up therapies     Equipment Recommendations  None recommended by PT    Recommendations for Other Services       Precautions / Restrictions Precautions Precautions: Fall;Other (comment) Precaution Comments: watch SpO2 Restrictions Weight Bearing Restrictions:  Yes RLE Weight Bearing: Weight bearing as tolerated    Mobility  Bed Mobility Overal bed mobility: Needs Assistance Bed Mobility: Sit to Supine     Supine to sit: Supervision;HOB elevated     General bed mobility comments: No assist needed, able to bring RLE into bed to return to supine without difficulty simulating home bed environment.    Transfers Overall transfer level: Needs assistance Equipment used: Rolling walker (2 wheeled) Transfers: Sit to/from Stand Sit to Stand: Supervision         General transfer comment: Supervision for safety. Stood from Automotive engineer. Good demo of hand placement.  Ambulation/Gait Ambulation/Gait assistance: Min guard;Supervision Gait Distance (Feet): 50 Feet Assistive device: Rolling walker (2 wheeled) Gait Pattern/deviations: Step-through pattern;Decreased stride length;Decreased weight shift to right;Decreased stance time - right;Antalgic Gait velocity: decreased   General Gait Details: Slow, mildly unsteady antalgic like gait with RW for support; Sp02 dropped to 84% on RA, donned 2L and able to maintain Sp02 >91% with activity. 1/4 DOE. "no more SOB than usual." Increased reliance on UEs for support.   Stairs Stairs: Yes Stairs assistance: Min guard Stair Management: Step to pattern;Two rails Number of Stairs: 2 General stair comments: Cues for technique/safety and use of Bil rails.   Wheelchair Mobility    Modified Rankin (Stroke Patients Only)       Balance Overall balance assessment: Needs assistance Sitting-balance support: No upper extremity supported;Feet supported Sitting balance-Leahy Scale: Good     Standing balance support: During functional activity Standing balance-Leahy Scale: Poor Standing balance comment: reliant on BUE for dynamic standing  Cognition Arousal/Alertness: Awake/alert Behavior During Therapy: WFL for tasks assessed/performed Overall Cognitive Status:  Impaired/Different from baseline Area of Impairment: Memory                     Memory: Decreased short-term memory         General Comments: Reports hx of STM deficits. Does not recall needing to call for help priort o getting up or HEP. LIkely close to baseline.      Exercises Total Joint Exercises Ankle Circles/Pumps: AROM;Both;10 reps;Supine Quad Sets: AROM;Both;10 reps;Supine Heel Slides: AROM;Right;10 reps;Supine Hip ABduction/ADduction: AROM;Right;5 reps;Supine    General Comments General comments (skin integrity, edema, etc.): Sp02 88% on RA at rest, drops to 84% with activity; donned 2L/min 02 [place] and able to maintain Sp02 >91% with walking.      Pertinent Vitals/Pain Pain Assessment: 0-10 Pain Score: 7  Pain Location: right hip Pain Descriptors / Indicators: Discomfort;Aching Pain Intervention(s): Monitored during session;Repositioned    Home Living                      Prior Function            PT Goals (current goals can now be found in the care plan section) Progress towards PT goals: Progressing toward goals    Frequency    7X/week      PT Plan Current plan remains appropriate    Co-evaluation              AM-PAC PT "6 Clicks" Mobility   Outcome Measure  Help needed turning from your back to your side while in a flat bed without using bedrails?: None Help needed moving from lying on your back to sitting on the side of a flat bed without using bedrails?: A Little Help needed moving to and from a bed to a chair (including a wheelchair)?: A Little Help needed standing up from a chair using your arms (e.g., wheelchair or bedside chair)?: A Little Help needed to walk in hospital room?: A Little Help needed climbing 3-5 steps with a railing? : A Little 6 Click Score: 19    End of Session Equipment Utilized During Treatment: Gait belt;Oxygen Activity Tolerance: Patient tolerated treatment well Patient left: in bed;with call  bell/phone within reach;with bed alarm set Nurse Communication: Mobility status;Other (comment) (need for 02) PT Visit Diagnosis: Other abnormalities of gait and mobility (R26.89);Pain Pain - Right/Left: Right Pain - part of body: Hip     Time: 7124-5809 PT Time Calculation (min) (ACUTE ONLY): 25 min  Charges:  $Gait Training: 8-22 mins $Therapeutic Activity: 8-22 mins                     Marisa Severin, PT, DPT Acute Rehabilitation Services Pager [phone] [place] 07/31/2020, 9:56 AM

## 2020-08-03 ENCOUNTER — Telehealth: Payer: Self-pay | Admitting: *Deleted

## 2020-08-03 NOTE — Telephone Encounter (Signed)
Ortho bundle D/C call completed. 

## 2020-08-04 DIAGNOSIS — K589 Irritable bowel syndrome without diarrhea: Secondary | ICD-10-CM | POA: Diagnosis not present

## 2020-08-04 DIAGNOSIS — I5030 Unspecified diastolic (congestive) heart failure: Secondary | ICD-10-CM | POA: Diagnosis not present

## 2020-08-04 DIAGNOSIS — M549 Dorsalgia, unspecified: Secondary | ICD-10-CM | POA: Diagnosis not present

## 2020-08-04 DIAGNOSIS — J9819 Other pulmonary collapse: Secondary | ICD-10-CM | POA: Diagnosis not present

## 2020-08-04 DIAGNOSIS — I110 Hypertensive heart disease with heart failure: Secondary | ICD-10-CM | POA: Diagnosis not present

## 2020-08-04 DIAGNOSIS — E119 Type 2 diabetes mellitus without complications: Secondary | ICD-10-CM | POA: Diagnosis not present

## 2020-08-04 DIAGNOSIS — I480 Paroxysmal atrial fibrillation: Secondary | ICD-10-CM | POA: Diagnosis not present

## 2020-08-04 DIAGNOSIS — Z471 Aftercare following joint replacement surgery: Secondary | ICD-10-CM | POA: Diagnosis not present

## 2020-08-04 DIAGNOSIS — I341 Nonrheumatic mitral (valve) prolapse: Secondary | ICD-10-CM | POA: Diagnosis not present

## 2020-08-04 DIAGNOSIS — F323 Major depressive disorder, single episode, severe with psychotic features: Secondary | ICD-10-CM | POA: Diagnosis not present

## 2020-08-04 DIAGNOSIS — J9601 Acute respiratory failure with hypoxia: Secondary | ICD-10-CM | POA: Diagnosis not present

## 2020-08-04 DIAGNOSIS — G8929 Other chronic pain: Secondary | ICD-10-CM | POA: Diagnosis not present

## 2020-08-04 DIAGNOSIS — J310 Chronic rhinitis: Secondary | ICD-10-CM | POA: Diagnosis not present

## 2020-08-04 DIAGNOSIS — F29 Unspecified psychosis not due to a substance or known physiological condition: Secondary | ICD-10-CM | POA: Diagnosis not present

## 2020-08-04 DIAGNOSIS — E785 Hyperlipidemia, unspecified: Secondary | ICD-10-CM | POA: Diagnosis not present

## 2020-08-04 DIAGNOSIS — E039 Hypothyroidism, unspecified: Secondary | ICD-10-CM | POA: Diagnosis not present

## 2020-08-04 DIAGNOSIS — J439 Emphysema, unspecified: Secondary | ICD-10-CM | POA: Diagnosis not present

## 2020-08-04 DIAGNOSIS — M1712 Unilateral primary osteoarthritis, left knee: Secondary | ICD-10-CM | POA: Diagnosis not present

## 2020-08-04 DIAGNOSIS — G709 Myoneural disorder, unspecified: Secondary | ICD-10-CM | POA: Diagnosis not present

## 2020-08-04 DIAGNOSIS — J9602 Acute respiratory failure with hypercapnia: Secondary | ICD-10-CM | POA: Diagnosis not present

## 2020-08-04 DIAGNOSIS — F0281 Dementia in other diseases classified elsewhere with behavioral disturbance: Secondary | ICD-10-CM | POA: Diagnosis not present

## 2020-08-04 DIAGNOSIS — I4892 Unspecified atrial flutter: Secondary | ICD-10-CM | POA: Diagnosis not present

## 2020-08-04 DIAGNOSIS — K219 Gastro-esophageal reflux disease without esophagitis: Secondary | ICD-10-CM | POA: Diagnosis not present

## 2020-08-04 DIAGNOSIS — I471 Supraventricular tachycardia: Secondary | ICD-10-CM | POA: Diagnosis not present

## 2020-08-04 DIAGNOSIS — H409 Unspecified glaucoma: Secondary | ICD-10-CM | POA: Diagnosis not present

## 2020-08-05 ENCOUNTER — Telehealth: Payer: Self-pay | Admitting: *Deleted

## 2020-08-05 NOTE — Care Plan (Signed)
7 day call to patient. Reports she is doing well. Had therapy yesterday and O2 sats were 93% on RA. Heart rate was 68 per therapist. She reports she is in moderate pain, but trying to limit pain medication so she isn't taking so much. Appointment here in office on Friday with Dr. Lorin Mercy.

## 2020-08-05 NOTE — Telephone Encounter (Signed)
Ortho bundle 7 day call completed. 

## 2020-08-06 DIAGNOSIS — I110 Hypertensive heart disease with heart failure: Secondary | ICD-10-CM | POA: Diagnosis not present

## 2020-08-06 DIAGNOSIS — I480 Paroxysmal atrial fibrillation: Secondary | ICD-10-CM | POA: Diagnosis not present

## 2020-08-06 DIAGNOSIS — E119 Type 2 diabetes mellitus without complications: Secondary | ICD-10-CM | POA: Diagnosis not present

## 2020-08-06 DIAGNOSIS — Z471 Aftercare following joint replacement surgery: Secondary | ICD-10-CM | POA: Diagnosis not present

## 2020-08-06 DIAGNOSIS — I4892 Unspecified atrial flutter: Secondary | ICD-10-CM | POA: Diagnosis not present

## 2020-08-06 DIAGNOSIS — I5030 Unspecified diastolic (congestive) heart failure: Secondary | ICD-10-CM | POA: Diagnosis not present

## 2020-08-07 ENCOUNTER — Ambulatory Visit: Payer: Self-pay

## 2020-08-07 ENCOUNTER — Telehealth: Payer: Self-pay | Admitting: *Deleted

## 2020-08-07 ENCOUNTER — Other Ambulatory Visit: Payer: Self-pay

## 2020-08-07 ENCOUNTER — Ambulatory Visit (INDEPENDENT_AMBULATORY_CARE_PROVIDER_SITE_OTHER): Payer: Medicare Other | Admitting: Orthopaedic Surgery

## 2020-08-07 ENCOUNTER — Encounter: Payer: Self-pay | Admitting: Orthopaedic Surgery

## 2020-08-07 VITALS — BP 123/73 | HR 71 | Ht 65.5 in | Wt 182.0 lb

## 2020-08-07 DIAGNOSIS — E119 Type 2 diabetes mellitus without complications: Secondary | ICD-10-CM | POA: Diagnosis not present

## 2020-08-07 DIAGNOSIS — I480 Paroxysmal atrial fibrillation: Secondary | ICD-10-CM | POA: Diagnosis not present

## 2020-08-07 DIAGNOSIS — I110 Hypertensive heart disease with heart failure: Secondary | ICD-10-CM | POA: Diagnosis not present

## 2020-08-07 DIAGNOSIS — Z96641 Presence of right artificial hip joint: Secondary | ICD-10-CM

## 2020-08-07 DIAGNOSIS — Z471 Aftercare following joint replacement surgery: Secondary | ICD-10-CM | POA: Diagnosis not present

## 2020-08-07 DIAGNOSIS — I4892 Unspecified atrial flutter: Secondary | ICD-10-CM | POA: Diagnosis not present

## 2020-08-07 DIAGNOSIS — I5030 Unspecified diastolic (congestive) heart failure: Secondary | ICD-10-CM | POA: Diagnosis not present

## 2020-08-07 NOTE — Progress Notes (Signed)
Post-Op Visit Note   Patient: Destiny Hughes           Date of Birth: 1945-08-11           MRN: [id number] Visit Date: 08/07/2020 PCP: Billie Ruddy, MD   Assessment & Plan: Post right total hip arthroplasty.  New dressing applied return 1 week for staple removal.  She is making gradual progress with walking leg lengths equal.  X-rays postop were reviewed from last week which showed good position and alignment.pain meds were making her nauseated. Still anxious , problems sleeping , sometimes uses sleeping pill plus melatonin, also has benadryl OTC.   Chief Complaint:  Chief Complaint  Patient presents with  . Right Hip - Routine Post Op    07/29/2020 Right THA   Visit Diagnoses:  1. S/P total right hip arthroplasty     Plan: Return 1week for staple removal and Steri-Strips.  Follow-Up Instructions: No follow-ups on file.   Orders:  No orders of the defined types were placed in this encounter.  No orders of the defined types were placed in this encounter.   Imaging: No results found.  PMFS History: Patient Active Problem List   Diagnosis Date Noted  . Status post total hip replacement, right 07/29/2020  . Diabetes mellitus ([place]) 07/08/2020  . Unilateral primary osteoarthritis, right hip 03/30/2020  . Unilateral primary osteoarthritis, left knee 03/30/2020  . History of fusion of cervical spine 07/03/2019  . Spinal cord stimulator dysfunction ([place]) 06/04/2018  . Chronic cough 05/21/2018  . Tremor 02/16/2018  . Chronic rhinitis 02/05/2018  . Hallucinations   . Psychosis ([place]) 11/07/2016  . Acute respiratory failure with hypoxia and hypercapnia ([place]) 11/03/2016  . Other chest pain 10/23/2016  . Tobacco abuse 10/23/2016  . Major depression with psychotic features ([place]) 10/19/2016  . Dementia with behavioral disturbance ([place]) 10/19/2016  . Depression due to physical illness   . Cognitive retention disorder   . Atrial flutter ([place]) 08/15/2016  . Paroxysmal  atrial fibrillation ([place]) 08/15/2016  . SVT (supraventricular tachycardia) ([place]) 07/11/2016  . Weight loss 07/08/2016  . AKI (acute kidney injury) ([place]) 07/02/2016  . Acute kidney injury ([place]) 07/01/2016  . Hyperglycemia 07/01/2016  . Hyponatremia 07/01/2016  . Intractable nausea and vomiting 07/01/2016  . Abnormal EKG 07/01/2016  . Diastolic CHF ([place]) 78/29/5621  . Medication management 04/28/2015  . Emphysema lung ([place]) 12/26/2014  . Gastroesophageal reflux disease without esophagitis 12/26/2014  . Chronic bronchitis ([place]) 11/13/2014  . COPD (chronic obstructive pulmonary disease) ([place]) 10/03/2014  . At high risk for falls 10/03/2014  . Other specified hypothyroidism 10/03/2014  . Mitral valve prolapse 10/03/2014  . Encounter for Medicare annual wellness exam 10/03/2014  . IBS (irritable bowel syndrome) 04/01/2014  . S/P total knee replacement 04/22/2013  . Knee pain, chronic 04/22/2013  . Essential hypertension 04/15/2013  . Vitamin D deficiency 04/15/2013  . Encounter for long-term (current) use of medications 04/15/2013  . Prediabetes 04/15/2013  . Hyperlipemia   . Glaucoma   . Chronic pain disorder   . Chronic back pain    Past Medical History:  Diagnosis Date  . Arthritis    OA- knees & back  neck  . Chronic back pain   . Cognitive retention disorder   . Collapse of right lung   . COPD (chronic obstructive pulmonary disease) (HCC)    Hx. Bronchitis occ, 01-25-11 somes issue now taking  Z-pak-started 01-24-11/Scar tissue  present  from previous lung collapse  .  Dementia with psychosis ([place])   . Depression   . Depression   . Diabetes mellitus ([place]) 07/08/2020  . Dyspnea   . Dysrhythmia 2018   treated with Ablation   . GERD (gastroesophageal reflux disease)    tx. TUMS  . Glaucoma   . Headache   . History of kidney stones    surgically treated  . Hyperlipemia   . Hypertension   . Hypothyroidism    tx. Levothyroxine  . Major depression with psychotic features  ([place])   . Nephrolithiasis   . Neuromuscular disorder ([place]) 01-25-11   Pain/nerve stimulator implanted -2 yrs ago.  Marland Kitchen Reflex, gag, absent   . Reflex, gag, absent     Family History  Problem Relation Age of Onset  . Heart attack Father   . Emphysema Father   . Aneurysm Mother        CEREBRAL  . Emphysema Mother   . Dementia Mother   . Diabetes Son   . Hypertension Son   . Hypertension Son   . Cancer Sister        Widely Metastatic  . Asthma Son        x2  . Breast cancer Paternal Aunt   . Rheum arthritis Maternal Aunt     Past Surgical History:  Procedure Laterality Date  . A-FLUTTER ABLATION N/A 08/16/2016   Procedure: A-Flutter Ablation;  Surgeon: Evans Lance, MD;  Location: [facility];  Service: Cardiovascular;  Laterality: N/A;  . ANTERIOR CERVICAL DECOMP/DISCECTOMY FUSION     3 levels, per family  . APPENDECTOMY  01-25-11  . CARPAL TUNNEL RELEASE  01-25-11   Bil.  . CERVICAL SPINE SURGERY  01-25-11   2005-multiple levels  . CHOLECYSTECTOMY  01-25-11   '07  . COLONOSCOPY WITH PROPOFOL N/A 07/23/2012   Procedure: COLONOSCOPY WITH PROPOFOL;  Surgeon: Garlan Fair, MD;  Location: [facility];  Service: Endoscopy;  Laterality: N/A;  . detatched retna Bilateral    done July & August- 2019  . EYE SURGERY Bilateral 2019   repair of retina detachment- Dr. Zadie Rhine at surgical center   . JOINT REPLACEMENT  01-25-11   6'03 LTHA-hemi  . KNEE ARTHROPLASTY  01-25-11   '04-right, revised x2  . no gag reflex     Pt does not havea gag reflex following  siurgery to neck. Family unsure of date. Pt takes pill in apple sauce.  . SPINAL CORD STIMULATOR BATTERY EXCHANGE N/A 01/12/2018   Procedure: Spinal cord stimulator battery replacement;  Surgeon: Clydell Hakim, MD;  Location: [facility];  Service: Neurosurgery;  Laterality: N/A;  left  . SPINAL CORD STIMULATOR IMPLANT  2009 APPROX   DR. ELSNER  . SPINAL CORD STIMULATOR REMOVAL N/A 06/04/2018   Procedure: Removal of spinal cord  stimulator, thoracic paddle and generator;  Surgeon: Kristeen Miss, MD;  Location: [facility];  Service: Neurosurgery;  Laterality: N/A;  . THORACIC SPINE SURGERY  01-25-11   6'02- then nerve stimulator implanted after  . TOTAL HIP ARTHROPLASTY Right 07/29/2020   Procedure: RIGHT TOTAL HIP ARTHROPLASTY ANTERIOR APPROACH;  Surgeon: Marybelle Killings, MD;  Location: [facility];  Service: Orthopedics;  Laterality: Right;  . TOTAL KNEE REVISION  02/01/2011   Procedure: TOTAL KNEE REVISION;  Surgeon: Mauri Pole;  Location: [facility];  Service: Orthopedics;  Laterality: Right;  . TUBAL LIGATION     Social History   Occupational History  . Occupation: disabled  Tobacco Use  . Smoking status: Former Smoker  Packs/day: 0.75    Years: 50.00    Pack years: 37.50    Types: Cigarettes    Start date: 09/13/1961    Quit date: 05/2016    Years since quitting: 4.2  . Smokeless tobacco: Never Used  . Tobacco comment: Decreased Down to 2/3 a Day if Thinking [place] It  Vaping Use  . Vaping Use: Never used  Substance and Sexual Activity  . Alcohol use: No    Alcohol/week: 0.0 standard drinks    Comment: none in 10 years  . Drug use: No  . Sexual activity: Never

## 2020-08-07 NOTE — Telephone Encounter (Signed)
Ortho bundle in office visit completed today.

## 2020-08-10 DIAGNOSIS — I110 Hypertensive heart disease with heart failure: Secondary | ICD-10-CM | POA: Diagnosis not present

## 2020-08-10 DIAGNOSIS — I5030 Unspecified diastolic (congestive) heart failure: Secondary | ICD-10-CM | POA: Diagnosis not present

## 2020-08-10 DIAGNOSIS — I4892 Unspecified atrial flutter: Secondary | ICD-10-CM | POA: Diagnosis not present

## 2020-08-10 DIAGNOSIS — I480 Paroxysmal atrial fibrillation: Secondary | ICD-10-CM | POA: Diagnosis not present

## 2020-08-10 DIAGNOSIS — E119 Type 2 diabetes mellitus without complications: Secondary | ICD-10-CM | POA: Diagnosis not present

## 2020-08-10 DIAGNOSIS — Z471 Aftercare following joint replacement surgery: Secondary | ICD-10-CM | POA: Diagnosis not present

## 2020-08-11 DIAGNOSIS — I110 Hypertensive heart disease with heart failure: Secondary | ICD-10-CM | POA: Diagnosis not present

## 2020-08-11 DIAGNOSIS — E119 Type 2 diabetes mellitus without complications: Secondary | ICD-10-CM | POA: Diagnosis not present

## 2020-08-11 DIAGNOSIS — I480 Paroxysmal atrial fibrillation: Secondary | ICD-10-CM | POA: Diagnosis not present

## 2020-08-11 DIAGNOSIS — I4892 Unspecified atrial flutter: Secondary | ICD-10-CM | POA: Diagnosis not present

## 2020-08-11 DIAGNOSIS — I5030 Unspecified diastolic (congestive) heart failure: Secondary | ICD-10-CM | POA: Diagnosis not present

## 2020-08-11 DIAGNOSIS — Z471 Aftercare following joint replacement surgery: Secondary | ICD-10-CM | POA: Diagnosis not present

## 2020-08-11 NOTE — Discharge Summary (Signed)
Patient ID: Destiny Hughes MRN: [id number] DOB/AGE: Nov 24, 1945 75 y.o.  Admit date: 07/29/2020 Discharge date: 07/31/2020 Admission Diagnoses:  Active Problems:   Status post total hip replacement, right   Discharge Diagnoses:  Active Problems:   Status post total hip replacement, right  status post Procedure(s): RIGHT TOTAL HIP ARTHROPLASTY ANTERIOR APPROACH  Past Medical History:  Diagnosis Date  . Arthritis    OA- knees & back  neck  . Chronic back pain   . Cognitive retention disorder   . Collapse of right lung   . COPD (chronic obstructive pulmonary disease) (HCC)    Hx. Bronchitis occ, 01-25-11 somes issue now taking  Z-pak-started 01-24-11/Scar tissue  present  from previous lung collapse  . Dementia with psychosis ([place])   . Depression   . Depression   . Diabetes mellitus ([place]) 07/08/2020  . Dyspnea   . Dysrhythmia 2018   treated with Ablation   . GERD (gastroesophageal reflux disease)    tx. TUMS  . Glaucoma   . Headache   . History of kidney stones    surgically treated  . Hyperlipemia   . Hypertension   . Hypothyroidism    tx. Levothyroxine  . Major depression with psychotic features ([place])   . Nephrolithiasis   . Neuromuscular disorder ([place]) 01-25-11   Pain/nerve stimulator implanted -2 yrs ago.  Marland Kitchen Reflex, gag, absent   . Reflex, gag, absent     Surgeries: Procedure(s): RIGHT TOTAL HIP ARTHROPLASTY ANTERIOR APPROACH on 07/29/2020   Consultants:   Discharged Condition: Improved  Hospital Course: Destiny Hughes is an 75 y.o. female who was admitted 07/29/2020 for operative treatment of right hip djd. Patient failed conservative treatments (please see the history and physical for the specifics) and had severe unremitting pain that affects sleep, daily activities and work/hobbies. After pre-op clearance, the patient was taken to the operating room on 07/29/2020 and underwent  Procedure(s): RIGHT TOTAL HIP ARTHROPLASTY ANTERIOR APPROACH.     Patient was given perioperative antibiotics:  Anti-infectives (From admission, onward)   Start     Dose/Rate Route Frequency Ordered Stop   07/29/20 1030  vancomycin (VANCOCIN) IVPB 1000 mg/200 mL premix        1,000 mg 200 mL/hr over 60 Minutes Intravenous On call to O.R. 07/29/20 1029 07/29/20 1223       Patient was given sequential compression devices and early ambulation to prevent DVT.   Patient benefited maximally from hospital stay and there were no complications. At the time of discharge, the patient was urinating/moving their bowels without difficulty, tolerating a regular diet, pain is controlled with oral pain medications and they have been cleared by PT/OT.   Recent vital signs: No data found.   Recent laboratory studies: No results for input(s): WBC, HGB, HCT, PLT, NA, K, CL, CO2, BUN, CREATININE, GLUCOSE, INR, CALCIUM in the last 72 hours.  Invalid input(s): PT, 2   Discharge Medications:   Allergies as of 07/31/2020      Reactions   Albuterol Shortness Of Breath, Other (See Comments)   Penicillins Anaphylaxis, Swelling, Other (See Comments)   Has patient had a PCN reaction causing immediate rash, facial/tongue/throat swelling, SOB or lightheadedness with hypotension: Yes Has patient had a PCN reaction causing severe rash involving mucus membranes or skin necrosis: Rash Has patient had a PCN reaction that required hospitalization: No Has patient had a PCN reaction occurring within the last 10 years: No If all of the above answers are "NO", then may  proceed with Cephalosporin use.   Sulfa Antibiotics Swelling, Other (See Comments)   Throat swells, but no shortness of breath noted   Codeine Nausea And Vomiting   Ecotrin [aspirin] Nausea And Vomiting   Zanaflex [tizanidine] Other (See Comments)   Possible confusion      Medication List    TAKE these medications   acetaminophen 500 MG tablet Commonly known as: TYLENOL Take 1,000 mg by mouth every 6 (six) hours  as needed for moderate pain.   amLODipine 5 MG tablet Commonly known as: NORVASC Take 1 tablet (5 mg total) by mouth daily.   aspirin 325 MG EC tablet Take 1 tablet (325 mg total) by mouth daily with breakfast.   buPROPion 150 MG 24 hr tablet Commonly known as: Wellbutrin XL Take 2 tablets (300 mg total) by mouth every morning.   diphenhydrAMINE 25 MG tablet Commonly known as: BENADRYL Take 25 mg by mouth in the morning.   donepezil 23 MG Tabs tablet Commonly known as: ARICEPT Take 0.5 tablets (11.5 mg total) by mouth at bedtime.   donepezil 10 MG tablet Commonly known as: ARICEPT Take 10 mg by mouth at bedtime.   fluticasone 50 MCG/ACT nasal spray Commonly known as: FLONASE 1 OR 2 SPRAYS IN NOSE ONCE A DAY   gabapentin 100 MG capsule Commonly known as: NEURONTIN Take 1 capsule (100 mg total) by mouth 2 (two) times daily.   HYDROcodone-acetaminophen 5-325 MG tablet Commonly known as: Norco Take 1-2 tablets by mouth every 6 (six) hours as needed for moderate pain. What changed:   how much to take  when to take this  reasons to take this   ipratropium 0.03 % nasal spray Commonly known as: ATROVENT PLACE 2 SPRAYS INTO BOTH NOSTRILS EVERY 12 HOURS   levothyroxine 125 MCG tablet Commonly known as: SYNTHROID TAKE ONE TABLET AT BEDTIME What changed: when to take this   lisinopril 10 MG tablet Commonly known as: ZESTRIL Take 10 mg by mouth daily.   lisinopril 5 MG tablet Commonly known as: ZESTRIL Take 1 tablet (5 mg total) by mouth daily.   melatonin 5 MG Tabs Take 5 mg by mouth at bedtime.   metFORMIN 500 MG tablet Commonly known as: GLUCOPHAGE Take 1 tablet (500 mg total) by mouth 2 (two) times daily with a meal.   ondansetron 4 MG disintegrating tablet Commonly known as: Zofran ODT Take 1 tablet (4 mg total) by mouth every 8 (eight) hours as needed for nausea or vomiting.   pantoprazole 40 MG tablet Commonly known as: PROTONIX TAKE ONE TABLET  DAILY   propranolol 80 MG tablet Commonly known as: INDERAL Take 0.5 tablets (40 mg total) by mouth 3 (three) times daily.   rosuvastatin 5 MG tablet Commonly known as: CRESTOR TAKE ONE TABLET EACH DAY What changed: See the new instructions.   sodium chloride 1 g tablet TAKE ONE TABLET THREE TIMES DAILY   Stiolto Respimat 2.5-2.5 MCG/ACT Aers Generic drug: Tiotropium Bromide-Olodaterol Inhale 2 puffs into the lungs daily.   Stiolto Respimat 2.5-2.5 MCG/ACT Aers Generic drug: Tiotropium Bromide-Olodaterol Inhale 2 puffs into the lungs daily.   Stiolto Respimat 2.5-2.5 MCG/ACT Aers Generic drug: Tiotropium Bromide-Olodaterol Inhale 2 puffs into the lungs daily.   temazepam 30 MG capsule Commonly known as: RESTORIL Take 1 capsule (30 mg total) by mouth at bedtime.       Diagnostic Studies: DG Chest 2 View  Result Date: 07/29/2020 CLINICAL DATA:  Preoperative RIGHT total hip arthroplasty. No reported chest  complaints in a 75 year old female EXAM: CHEST - 2 VIEW COMPARISON:  July 15, 2019 FINDINGS: ACDF in the lower cervical spine as before, not well evaluated. Trachea midline. Cardiomediastinal contours and hilar structures are stable. Scarring is again noted at the RIGHT lung base. Lungs remain hyperinflated. No sign of consolidation or evidence of pleural effusion. On limited assessment no acute skeletal process. Healed rib fractures along the LEFT and RIGHT chest. IMPRESSION: No active cardiopulmonary disease. COPD. Electronically Signed   By: Zetta Bills M.D.   On: 07/29/2020 09:13   DG C-Arm 1-60 Min  Result Date: 07/29/2020 CLINICAL DATA:  Right hip arthroplasty. EXAM: OPERATIVE RIGHT HIP (WITH PELVIS IF PERFORMED) TECHNIQUE: Fluoroscopic spot image(s) were submitted for interpretation post-operatively. COMPARISON:  None. FINDINGS: Two fluoroscopic spot views of the pelvis and right hip obtained in the operating room. Right hip arthroplasty in expected alignment.  Fluoroscopy time 16 seconds. Dose 1.63 mGy. IMPRESSION: Procedural fluoroscopy for right hip arthroplasty. Electronically Signed   By: Keith Rake M.D.   On: 07/29/2020 15:17   DG Hip Port Unilat With Pelvis 1V Right  Result Date: 07/29/2020 CLINICAL DATA:  Status post right hip arthroplasty. EXAM: DG HIP (WITH OR WITHOUT PELVIS) 1V PORT RIGHT COMPARISON:  06/04/2008 FINDINGS: Right hip arthroplasty in expected alignment. No periprosthetic lucency or fracture. Recent postsurgical change includes air and edema in the soft tissues and lateral skin staples. Left hip arthroplasty is intact were visualized. IMPRESSION: Right hip arthroplasty without immediate postoperative complication. Electronically Signed   By: Keith Rake M.D.   On: 07/29/2020 16:12   DG HIP OPERATIVE UNILAT WITH PELVIS RIGHT  Result Date: 07/29/2020 CLINICAL DATA:  Right hip arthroplasty. EXAM: OPERATIVE RIGHT HIP (WITH PELVIS IF PERFORMED) TECHNIQUE: Fluoroscopic spot image(s) were submitted for interpretation post-operatively. COMPARISON:  None. FINDINGS: Two fluoroscopic spot views of the pelvis and right hip obtained in the operating room. Right hip arthroplasty in expected alignment. Fluoroscopy time 16 seconds. Dose 1.63 mGy. IMPRESSION: Procedural fluoroscopy for right hip arthroplasty. Electronically Signed   By: Keith Rake M.D.   On: 07/29/2020 15:17       Follow-up Information    Marybelle Killings, MD. Go on 08/07/2020.   Specialty: Orthopedic Surgery Why: at 2:15 pm for your in office appointment with Dr. Verlene Mayer information: [place] [id number] [phone]               Discharge Plan:  discharge to home  Disposition:     Signed: Benjiman Core 08/11/2020, 3:41 PM

## 2020-08-12 ENCOUNTER — Ambulatory Visit: Payer: Self-pay

## 2020-08-12 ENCOUNTER — Other Ambulatory Visit: Payer: Self-pay

## 2020-08-12 ENCOUNTER — Ambulatory Visit (INDEPENDENT_AMBULATORY_CARE_PROVIDER_SITE_OTHER): Payer: Medicare Other | Admitting: Surgery

## 2020-08-12 ENCOUNTER — Telehealth: Payer: Self-pay | Admitting: *Deleted

## 2020-08-12 DIAGNOSIS — Z96641 Presence of right artificial hip joint: Secondary | ICD-10-CM

## 2020-08-12 NOTE — Telephone Encounter (Signed)
Ortho bundle 14 day in office meeting completed. °

## 2020-08-12 NOTE — Care Plan (Signed)
Patient seen in office today during her 14 day post op appointment. She reports having a good amount of pain, but not taking much pain medication per her husband. She states it makes her nauseated and is taking something OTC for this. CM guessed per the husband's description that she was taking Emetrol (an OTC liquid anti-emetic), but since not seeing in person, cannot confirm this. She has one more therapy visit with HHPT. Patient and husband both verbalize being very frustrated with each other during visit today. Update provided to Esaw Grandchild that patient is becoming nauseated by her pain medication and not taking much, but still in pain with taking Tylenol. Husband reports she is "taking a lot of Tylenol". Will continue to follow for needs.

## 2020-08-13 ENCOUNTER — Encounter: Payer: Medicare Other | Admitting: Surgery

## 2020-08-14 DIAGNOSIS — E119 Type 2 diabetes mellitus without complications: Secondary | ICD-10-CM | POA: Diagnosis not present

## 2020-08-14 DIAGNOSIS — I5030 Unspecified diastolic (congestive) heart failure: Secondary | ICD-10-CM | POA: Diagnosis not present

## 2020-08-14 DIAGNOSIS — I480 Paroxysmal atrial fibrillation: Secondary | ICD-10-CM | POA: Diagnosis not present

## 2020-08-14 DIAGNOSIS — I4892 Unspecified atrial flutter: Secondary | ICD-10-CM | POA: Diagnosis not present

## 2020-08-14 DIAGNOSIS — Z471 Aftercare following joint replacement surgery: Secondary | ICD-10-CM | POA: Diagnosis not present

## 2020-08-14 DIAGNOSIS — I110 Hypertensive heart disease with heart failure: Secondary | ICD-10-CM | POA: Diagnosis not present

## 2020-08-18 ENCOUNTER — Other Ambulatory Visit: Payer: Self-pay

## 2020-08-18 ENCOUNTER — Ambulatory Visit (HOSPITAL_COMMUNITY): Payer: Medicare Other | Admitting: Psychiatry

## 2020-08-19 ENCOUNTER — Encounter: Payer: Self-pay | Admitting: Orthopaedic Surgery

## 2020-08-19 ENCOUNTER — Ambulatory Visit (INDEPENDENT_AMBULATORY_CARE_PROVIDER_SITE_OTHER): Payer: Medicare Other | Admitting: Orthopaedic Surgery

## 2020-08-19 VITALS — BP 166/91 | HR 69 | Ht 65.5 in | Wt 182.0 lb

## 2020-08-19 DIAGNOSIS — Z96641 Presence of right artificial hip joint: Secondary | ICD-10-CM

## 2020-08-19 NOTE — Progress Notes (Signed)
Post-Op Visit Note   Patient: Destiny Hughes           Date of Birth: March 29, 1945           MRN: [id number] Visit Date: 08/19/2020 PCP: Billie Ruddy, MD   Assessment & Plan: Post right total of arthroplasty.  Staples are removed today incision looks good.  She is primarily having pain around her knee which has had multiple revisions.  She is using her walker.  Continue walking and recheck 6 weeks  Chief Complaint:  Chief Complaint  Patient presents with  . Right Hip - Follow-up, Wound Check    07/29/2020 Right THA   Visit Diagnoses:  1. Status post total hip replacement, right     Plan: Staples removed recheck 6 weeks  Follow-Up Instructions: Return in about 6 weeks (around 09/30/2020).   Orders:  No orders of the defined types were placed in this encounter.  No orders of the defined types were placed in this encounter.   Imaging: No results found.  PMFS History: Patient Active Problem List   Diagnosis Date Noted  . Status post total hip replacement, right 07/29/2020  . Diabetes mellitus ([place]) 07/08/2020  . Unilateral primary osteoarthritis, left knee 03/30/2020  . History of fusion of cervical spine 07/03/2019  . Spinal cord stimulator dysfunction ([place]) 06/04/2018  . Chronic cough 05/21/2018  . Tremor 02/16/2018  . Chronic rhinitis 02/05/2018  . Hallucinations   . Psychosis ([place]) 11/07/2016  . Acute respiratory failure with hypoxia and hypercapnia ([place]) 11/03/2016  . Other chest pain 10/23/2016  . Tobacco abuse 10/23/2016  . Major depression with psychotic features ([place]) 10/19/2016  . Dementia with behavioral disturbance ([place]) 10/19/2016  . Depression due to physical illness   . Cognitive retention disorder   . Atrial flutter ([place]) 08/15/2016  . Paroxysmal atrial fibrillation ([place]) 08/15/2016  . SVT (supraventricular tachycardia) ([place]) 07/11/2016  . Weight loss 07/08/2016  . AKI (acute kidney injury) ([place]) 07/02/2016  . Acute kidney injury ([place])  07/01/2016  . Hyperglycemia 07/01/2016  . Hyponatremia 07/01/2016  . Intractable nausea and vomiting 07/01/2016  . Abnormal EKG 07/01/2016  . Diastolic CHF ([place]) 22/63/3354  . Medication management 04/28/2015  . Emphysema lung ([place]) 12/26/2014  . Gastroesophageal reflux disease without esophagitis 12/26/2014  . Chronic bronchitis ([place]) 11/13/2014  . COPD (chronic obstructive pulmonary disease) ([place]) 10/03/2014  . At high risk for falls 10/03/2014  . Other specified hypothyroidism 10/03/2014  . Mitral valve prolapse 10/03/2014  . Encounter for Medicare annual wellness exam 10/03/2014  . IBS (irritable bowel syndrome) 04/01/2014  . S/P total knee replacement 04/22/2013  . Knee pain, chronic 04/22/2013  . Essential hypertension 04/15/2013  . Vitamin D deficiency 04/15/2013  . Encounter for long-term (current) use of medications 04/15/2013  . Prediabetes 04/15/2013  . Hyperlipemia   . Glaucoma   . Chronic pain disorder   . Chronic back pain    Past Medical History:  Diagnosis Date  . Arthritis    OA- knees & back  neck  . Chronic back pain   . Cognitive retention disorder   . Collapse of right lung   . COPD (chronic obstructive pulmonary disease) (HCC)    Hx. Bronchitis occ, 01-25-11 somes issue now taking  Z-pak-started 01-24-11/Scar tissue  present  from previous lung collapse  . Dementia with psychosis ([place])   . Depression   . Depression   . Diabetes mellitus ([place]) 07/08/2020  . Dyspnea   . Dysrhythmia 2018  treated with Ablation   . GERD (gastroesophageal reflux disease)    tx. TUMS  . Glaucoma   . Headache   . History of kidney stones    surgically treated  . Hyperlipemia   . Hypertension   . Hypothyroidism    tx. Levothyroxine  . Major depression with psychotic features ([place])   . Nephrolithiasis   . Neuromuscular disorder ([place]) 01-25-11   Pain/nerve stimulator implanted -2 yrs ago.  Marland Kitchen Reflex, gag, absent   . Reflex, gag, absent     Family History  Problem  Relation Age of Onset  . Heart attack Father   . Emphysema Father   . Aneurysm Mother        CEREBRAL  . Emphysema Mother   . Dementia Mother   . Diabetes Son   . Hypertension Son   . Hypertension Son   . Cancer Sister        Widely Metastatic  . Asthma Son        x2  . Breast cancer Paternal Aunt   . Rheum arthritis Maternal Aunt     Past Surgical History:  Procedure Laterality Date  . A-FLUTTER ABLATION N/A 08/16/2016   Procedure: A-Flutter Ablation;  Surgeon: Evans Lance, MD;  Location: [facility];  Service: Cardiovascular;  Laterality: N/A;  . ANTERIOR CERVICAL DECOMP/DISCECTOMY FUSION     3 levels, per family  . APPENDECTOMY  01-25-11  . CARPAL TUNNEL RELEASE  01-25-11   Bil.  . CERVICAL SPINE SURGERY  01-25-11   2005-multiple levels  . CHOLECYSTECTOMY  01-25-11   '07  . COLONOSCOPY WITH PROPOFOL N/A 07/23/2012   Procedure: COLONOSCOPY WITH PROPOFOL;  Surgeon: Garlan Fair, MD;  [facility];  Service: Endoscopy;  Laterality: N/A;  . detatched retna Bilateral    done July & August- 2019  . EYE SURGERY Bilateral 2019   repair of retina detachment- Dr. Zadie Rhine at surgical center   . JOINT REPLACEMENT  01-25-11   6'03 LTHA-hemi  . KNEE ARTHROPLASTY  01-25-11   '04-right, revised x2  . no gag reflex     Pt does not havea gag reflex following  siurgery to neck. Family unsure of date. Pt takes pill in apple sauce.  . SPINAL CORD STIMULATOR BATTERY EXCHANGE N/A 01/12/2018   Procedure: Spinal cord stimulator battery replacement;  Surgeon: Clydell Hakim, MD;  Location: [facility];  Service: Neurosurgery;  Laterality: N/A;  left  . SPINAL CORD STIMULATOR IMPLANT  2009 APPROX   DR. ELSNER  . SPINAL CORD STIMULATOR REMOVAL N/A 06/04/2018   Procedure: Removal of spinal cord stimulator, thoracic paddle and generator;  Surgeon: Kristeen Miss, MD;  Location: [facility];  Service: Neurosurgery;  Laterality: N/A;  . THORACIC SPINE SURGERY  01-25-11   6'02- then nerve  stimulator implanted after  . TOTAL HIP ARTHROPLASTY Right 07/29/2020   Procedure: RIGHT TOTAL HIP ARTHROPLASTY ANTERIOR APPROACH;  Surgeon: Marybelle Killings, MD;  Location: [facility];  Service: Orthopedics;  Laterality: Right;  . TOTAL KNEE REVISION  02/01/2011   Procedure: TOTAL KNEE REVISION;  Surgeon: Mauri Pole;  [facility];  Service: Orthopedics;  Laterality: Right;  . TUBAL LIGATION     Social History   Occupational History  . Occupation: disabled  Tobacco Use  . Smoking status: Former Smoker    Packs/day: 0.75    Years: 50.00    Pack years: 37.50    Types: Cigarettes    Start date: 09/13/1961  Quit date: 05/2016    Years since quitting: 4.2  . Smokeless tobacco: Never Used  . Tobacco comment: Decreased Down to 2/3 a Day if Thinking Abouit It  Vaping Use  . Vaping Use: Never used  Substance and Sexual Activity  . Alcohol use: No    Alcohol/week: 0.0 standard drinks    Comment: none in 10 years  . Drug use: No  . Sexual activity: Never

## 2020-08-25 ENCOUNTER — Encounter: Payer: Self-pay | Admitting: Surgery

## 2020-08-25 NOTE — Progress Notes (Signed)
Post-Op Visit Note   Patient: Destiny Hughes           Date of Birth: 1945-12-29           MRN: [id number] Visit Date: 08/12/2020 PCP: Billie Ruddy, MD   Assessment & Plan:  Chief Complaint:  Chief Complaint  Patient presents with  . Right Hip - Follow-up  75 year old white female who is 2 weeks status post right total replacement returns.  Patient complains of hip pain.  Also states that she has had a lot of nausea with Norco that was given postop and she is requesting stronger pain medication. Visit Diagnoses:  1. S/P total right hip arthroplasty     Plan: Hip wound examined with Dr. Lorin Mercy today.  Recommends that hip staples remain x1 more week and she will follow-up with him for recheck.  Patient had requested stronger pain medication but this was denied per Dr. Lorin Mercy.  Follow-Up Instructions: Return in about 1 week (around 08/19/2020) for with dr yates for wound check.   Orders:  Orders Placed This Encounter  Procedures  . XR HIP UNILAT W OR W/O PELVIS 2-3 VIEWS RIGHT   No orders of the defined types were placed in this encounter.   Imaging: No results found.  PMFS History: Patient Active Problem List   Diagnosis Date Noted  . Status post total hip replacement, right 07/29/2020  . Diabetes mellitus ([place]) 07/08/2020  . Unilateral primary osteoarthritis, left knee 03/30/2020  . History of fusion of cervical spine 07/03/2019  . Spinal cord stimulator dysfunction ([place]) 06/04/2018  . Chronic cough 05/21/2018  . Tremor 02/16/2018  . Chronic rhinitis 02/05/2018  . Hallucinations   . Psychosis ([place]) 11/07/2016  . Acute respiratory failure with hypoxia and hypercapnia ([place]) 11/03/2016  . Other chest pain 10/23/2016  . Tobacco abuse 10/23/2016  . Major depression with psychotic features ([place]) 10/19/2016  . Dementia with behavioral disturbance ([place]) 10/19/2016  . Depression due to physical illness   . Cognitive retention disorder   . Atrial flutter ([place])  08/15/2016  . Paroxysmal atrial fibrillation ([place]) 08/15/2016  . SVT (supraventricular tachycardia) ([place]) 07/11/2016  . Weight loss 07/08/2016  . AKI (acute kidney injury) ([place]) 07/02/2016  . Acute kidney injury ([place]) 07/01/2016  . Hyperglycemia 07/01/2016  . Hyponatremia 07/01/2016  . Intractable nausea and vomiting 07/01/2016  . Abnormal EKG 07/01/2016  . Diastolic CHF ([place]) 92/01/9416  . Medication management 04/28/2015  . Emphysema lung ([place]) 12/26/2014  . Gastroesophageal reflux disease without esophagitis 12/26/2014  . Chronic bronchitis ([place]) 11/13/2014  . COPD (chronic obstructive pulmonary disease) ([place]) 10/03/2014  . At high risk for falls 10/03/2014  . Other specified hypothyroidism 10/03/2014  . Mitral valve prolapse 10/03/2014  . Encounter for Medicare annual wellness exam 10/03/2014  . IBS (irritable bowel syndrome) 04/01/2014  . S/P total knee replacement 04/22/2013  . Knee pain, chronic 04/22/2013  . Essential hypertension 04/15/2013  . Vitamin D deficiency 04/15/2013  . Encounter for long-term (current) use of medications 04/15/2013  . Prediabetes 04/15/2013  . Hyperlipemia   . Glaucoma   . Chronic pain disorder   . Chronic back pain    Past Medical History:  Diagnosis Date  . Arthritis    OA- knees & back  neck  . Chronic back pain   . Cognitive retention disorder   . Collapse of right lung   . COPD (chronic obstructive pulmonary disease) (HCC)    Hx. Bronchitis occ, 01-25-11 somes issue now taking  Z-pak-started 01-24-11/Scar tissue  present  from previous lung collapse  . Dementia with psychosis ([place])   . Depression   . Depression   . Diabetes mellitus ([place]) 07/08/2020  . Dyspnea   . Dysrhythmia 2018   treated with Ablation   . GERD (gastroesophageal reflux disease)    tx. TUMS  . Glaucoma   . Headache   . History of kidney stones    surgically treated  . Hyperlipemia   . Hypertension   . Hypothyroidism    tx. Levothyroxine  . Major  depression with psychotic features ([place])   . Nephrolithiasis   . Neuromuscular disorder ([place]) 01-25-11   Pain/nerve stimulator implanted -2 yrs ago.  Marland Kitchen Reflex, gag, absent   . Reflex, gag, absent     Family History  Problem Relation Age of Onset  . Heart attack Father   . Emphysema Father   . Aneurysm Mother        CEREBRAL  . Emphysema Mother   . Dementia Mother   . Diabetes Son   . Hypertension Son   . Hypertension Son   . Cancer Sister        Widely Metastatic  . Asthma Son        x2  . Breast cancer Paternal Aunt   . Rheum arthritis Maternal Aunt     Past Surgical History:  Procedure Laterality Date  . A-FLUTTER ABLATION N/A 08/16/2016   Procedure: A-Flutter Ablation;  Surgeon: Evans Lance, MD;  Location: [facility];  Service: Cardiovascular;  Laterality: N/A;  . ANTERIOR CERVICAL DECOMP/DISCECTOMY FUSION     3 levels, per family  . APPENDECTOMY  01-25-11  . CARPAL TUNNEL RELEASE  01-25-11   Bil.  . CERVICAL SPINE SURGERY  01-25-11   2005-multiple levels  . CHOLECYSTECTOMY  01-25-11   '07  . COLONOSCOPY WITH PROPOFOL N/A 07/23/2012   Procedure: COLONOSCOPY WITH PROPOFOL;  Surgeon: Garlan Fair, MD;  [facility];  Service: Endoscopy;  Laterality: N/A;  . detatched retna Bilateral    done July & August- 2019  . EYE SURGERY Bilateral 2019   repair of retina detachment- Dr. Zadie Rhine at surgical center   . JOINT REPLACEMENT  01-25-11   6'03 LTHA-hemi  . KNEE ARTHROPLASTY  01-25-11   '04-right, revised x2  . no gag reflex     Pt does not havea gag reflex following  siurgery to neck. Family unsure of date. Pt takes pill in apple sauce.  . SPINAL CORD STIMULATOR BATTERY EXCHANGE N/A 01/12/2018   Procedure: Spinal cord stimulator battery replacement;  Surgeon: Clydell Hakim, MD;  Location: [facility];  Service: Neurosurgery;  Laterality: N/A;  left  . SPINAL CORD STIMULATOR IMPLANT  2009 APPROX   DR. ELSNER  . SPINAL CORD STIMULATOR REMOVAL N/A 06/04/2018    Procedure: Removal of spinal cord stimulator, thoracic paddle and generator;  Surgeon: Kristeen Miss, MD;  Location: [facility];  Service: Neurosurgery;  Laterality: N/A;  . THORACIC SPINE SURGERY  01-25-11   6'02- then nerve stimulator implanted after  . TOTAL HIP ARTHROPLASTY Right 07/29/2020   Procedure: RIGHT TOTAL HIP ARTHROPLASTY ANTERIOR APPROACH;  Surgeon: Marybelle Killings, MD;  Location: [facility];  Service: Orthopedics;  Laterality: Right;  . TOTAL KNEE REVISION  02/01/2011   Procedure: TOTAL KNEE REVISION;  Surgeon: Mauri Pole;  [facility];  Service: Orthopedics;  Laterality: Right;  . TUBAL LIGATION     Social History   Occupational History  .  Occupation: disabled  Tobacco Use  . Smoking status: Former Smoker    Packs/day: 0.75    Years: 50.00    Pack years: 37.50    Types: Cigarettes    Start date: 09/13/1961    Quit date: 05/2016    Years since quitting: 4.2  . Smokeless tobacco: Never Used  . Tobacco comment: Decreased Down to 2/3 a Day if Thinking Abouit It  Vaping Use  . Vaping Use: Never used  Substance and Sexual Activity  . Alcohol use: No    Alcohol/week: 0.0 standard drinks    Comment: none in 10 years  . Drug use: No  . Sexual activity: Never   Exam Hip wound looks good.  No drainage or signs infection.  Patient does have some swelling with possible seroma.  Neurologically intact.  Calf nontender.

## 2020-09-04 ENCOUNTER — Telehealth: Payer: Self-pay | Admitting: *Deleted

## 2020-09-04 NOTE — Telephone Encounter (Signed)
Ortho bundle 30 day call and survey completed. ?

## 2020-09-23 ENCOUNTER — Other Ambulatory Visit: Payer: Self-pay | Admitting: Family Medicine

## 2020-09-26 ENCOUNTER — Other Ambulatory Visit: Payer: Self-pay | Admitting: Family Medicine

## 2020-09-26 DIAGNOSIS — E1169 Type 2 diabetes mellitus with other specified complication: Secondary | ICD-10-CM

## 2020-09-30 ENCOUNTER — Ambulatory Visit (INDEPENDENT_AMBULATORY_CARE_PROVIDER_SITE_OTHER): Payer: Medicare Other | Admitting: Orthopaedic Surgery

## 2020-09-30 ENCOUNTER — Other Ambulatory Visit: Payer: Self-pay

## 2020-09-30 ENCOUNTER — Encounter: Payer: Self-pay | Admitting: Orthopaedic Surgery

## 2020-09-30 VITALS — Ht 65.5 in | Wt 182.0 lb

## 2020-09-30 DIAGNOSIS — Z96641 Presence of right artificial hip joint: Secondary | ICD-10-CM

## 2020-09-30 NOTE — Progress Notes (Signed)
Office Visit Note   Patient: Destiny Hughes           Date of Birth: [birth year]           MRN: [id number] Visit Date: 09/30/2020              Requested by: Billie Ruddy, MD [place],  [place] [id number] PCP: Billie Ruddy, MD   Assessment & Plan: Visit Diagnoses: Postop right total hip arthroplasty.  Plan: Patient is off pain medication.  She is walking with a walker due to balance problems.  Still has problems with her right knee and has had 2 revision surgeries.  Continue to try to increase walking distance gradually work her way onto her 4-prong cane and return in 2 months.  No x-ray needed on return.  Follow-Up Instructions: Return in about 2 months (around 12/01/2020).   Orders:  No orders of the defined types were placed in this encounter.  No orders of the defined types were placed in this encounter.     Procedures: No procedures performed   Clinical Data: No additional findings.   Subjective: Chief Complaint  Patient presents with   Right Hip - Follow-up    07/29/2020 Right THA    HPI  Review of Systems   Objective: Vital Signs: Ht 5' 5.5" (1.664 m)   Wt 182 lb (82.6 kg)   BMI 29.83 kg/m   Physical Exam  Ortho Exam  Specialty Comments:  No specialty comments available.  Imaging: No results found.   PMFS History: Patient Active Problem List   Diagnosis Date Noted   Status post total hip replacement, right 07/29/2020   Diabetes mellitus ([place]) 07/08/2020   Unilateral primary osteoarthritis, left knee 03/30/2020   History of fusion of cervical spine 07/03/2019   Spinal cord stimulator dysfunction ([place]) 06/04/2018   Chronic cough 05/21/2018   Tremor 02/16/2018   Chronic rhinitis 02/05/2018   Hallucinations    Psychosis ([place]) 11/07/2016   Acute respiratory failure with hypoxia and hypercapnia (HCC) 11/03/2016   Other chest pain 10/23/2016   Tobacco abuse 10/23/2016   Major depression with psychotic features  ([place]) 10/19/2016   Dementia with behavioral disturbance ([place]) 10/19/2016   Depression due to physical illness    Cognitive retention disorder    Atrial flutter ([place]) 08/15/2016   Paroxysmal atrial fibrillation ([place]) 08/15/2016   SVT (supraventricular tachycardia) ([place]) 07/11/2016   Weight loss 07/08/2016   AKI (acute kidney injury) ([place]) 07/02/2016   Acute kidney injury ([place]) 07/01/2016   Hyperglycemia 07/01/2016   Hyponatremia 07/01/2016   Intractable nausea and vomiting 07/01/2016   Abnormal EKG 94/17/4081   Diastolic CHF ([place]) 44/81/8563   Medication management 04/28/2015   Emphysema lung ([place]) 12/26/2014   Gastroesophageal reflux disease without esophagitis 12/26/2014   Chronic bronchitis ([place]) 11/13/2014   COPD (chronic obstructive pulmonary disease) ([place]) 10/03/2014   At high risk for falls 10/03/2014   Other specified hypothyroidism 10/03/2014   Mitral valve prolapse 10/03/2014   Encounter for Medicare annual wellness exam 10/03/2014   IBS (irritable bowel syndrome) 04/01/2014   S/P total knee replacement 04/22/2013   Knee pain, chronic 04/22/2013   Essential hypertension 04/15/2013   Vitamin D deficiency 04/15/2013   Encounter for long-term (current) use of medications 04/15/2013   Prediabetes 04/15/2013   Hyperlipemia    Glaucoma    Chronic pain disorder    Chronic back pain    Past Medical History:  Diagnosis Date   Arthritis  OA- knees & back  neck   Chronic back pain    Cognitive retention disorder    Collapse of right lung    COPD (chronic obstructive pulmonary disease) (HCC)    Hx. Bronchitis occ, 01-25-11 somes issue now taking  Z-pak-started 01-24-11/Scar tissue  present  from previous lung collapse   Dementia with psychosis ([place])    Depression    Depression    Diabetes mellitus ([place]) 07/08/2020   Dyspnea    Dysrhythmia 2018   treated with Ablation    GERD (gastroesophageal reflux disease)    tx. TUMS   Glaucoma    Headache    History of kidney  stones    surgically treated   Hyperlipemia    Hypertension    Hypothyroidism    tx. Levothyroxine   Major depression with psychotic features ([place])    Nephrolithiasis    Neuromuscular disorder ([place]) 01-25-11   Pain/nerve stimulator implanted -2 yrs ago.   Reflex, gag, absent    Reflex, gag, absent     Family History  Problem Relation Age of Onset   Heart attack Father    Emphysema Father    Aneurysm Mother        CEREBRAL   Emphysema Mother    Dementia Mother    Diabetes Son    Hypertension Son    Hypertension Son    Cancer Sister        Widely Metastatic   Asthma Son        x2   Breast cancer Paternal Aunt    Rheum arthritis Maternal Aunt     Past Surgical History:  Procedure Laterality Date   A-FLUTTER ABLATION N/A 08/16/2016   Procedure: A-Flutter Ablation;  Surgeon: Evans Lance, MD;  Location: [facility];  Service: Cardiovascular;  Laterality: N/A;   ANTERIOR CERVICAL DECOMP/DISCECTOMY FUSION     3 levels, per family   APPENDECTOMY  01-25-11   CARPAL TUNNEL RELEASE  01-25-11   Bil.   CERVICAL SPINE SURGERY  01-25-11   2005-multiple levels   CHOLECYSTECTOMY  01-25-11   '07   COLONOSCOPY WITH PROPOFOL N/A 07/23/2012   Procedure: COLONOSCOPY WITH PROPOFOL;  Surgeon: Garlan Fair, MD;  [facility];  Service: Endoscopy;  Laterality: N/A;   detatched retna Bilateral    done July & August- 2019   EYE SURGERY Bilateral 2019   repair of retina detachment- Dr. Zadie Rhine at surgical center    JOINT REPLACEMENT  01-25-11   6'03 [place]  01-25-11   '04-right, revised x2   no gag reflex     Pt does not havea gag reflex following  siurgery to neck. Family unsure of date. Pt takes pill in apple sauce.   SPINAL CORD STIMULATOR BATTERY EXCHANGE N/A 01/12/2018   Procedure: Spinal cord stimulator battery replacement;  Surgeon: Clydell Hakim, MD;  Location: [facility];  Service: Neurosurgery;  Laterality: N/A;  left   SPINAL CORD STIMULATOR  IMPLANT  2009 APPROX   DR. ELSNER   SPINAL CORD STIMULATOR REMOVAL N/A 06/04/2018   Procedure: Removal of spinal cord stimulator, thoracic paddle and generator;  Surgeon: Kristeen Miss, MD;  Location: [facility];  Service: Neurosurgery;  Laterality: N/A;   THORACIC SPINE SURGERY  01-25-11   6'02- then nerve stimulator implanted after   TOTAL HIP ARTHROPLASTY Right 07/29/2020   Procedure: RIGHT TOTAL HIP ARTHROPLASTY ANTERIOR APPROACH;  Surgeon: Marybelle Killings, MD;  Location: [facility];  Service: Orthopedics;  Laterality: Right;   TOTAL KNEE REVISION  02/01/2011   Procedure: TOTAL KNEE REVISION;  Surgeon: Mauri Pole;  [facility];  Service: Orthopedics;  Laterality: Right;   TUBAL LIGATION     Social History   Occupational History   Occupation: disabled  Tobacco Use   Smoking status: Former    Packs/day: 0.75    Years: 50.00    Pack years: 37.50    Types: Cigarettes    Start date: 09/13/1961    Quit date: 05/2016    Years since quitting: 4.3   Smokeless tobacco: Never   Tobacco comments:    Decreased Down to 2/3 a Day if Thinking Abouit It  Vaping Use   Vaping Use: Never used  Substance and Sexual Activity   Alcohol use: No    Alcohol/week: 0.0 standard drinks    Comment: none in 10 years   Drug use: No   Sexual activity: Never

## 2020-10-05 ENCOUNTER — Other Ambulatory Visit: Payer: Self-pay | Admitting: Family Medicine

## 2020-10-23 ENCOUNTER — Ambulatory Visit (INDEPENDENT_AMBULATORY_CARE_PROVIDER_SITE_OTHER): Payer: Medicare Other | Admitting: Emergency Medicine

## 2020-10-23 ENCOUNTER — Encounter: Payer: Self-pay | Admitting: Emergency Medicine

## 2020-10-23 ENCOUNTER — Other Ambulatory Visit: Payer: Self-pay

## 2020-10-23 DIAGNOSIS — J449 Chronic obstructive pulmonary disease, unspecified: Secondary | ICD-10-CM | POA: Diagnosis not present

## 2020-10-23 MED ORDER — FLUTICASONE PROPIONATE 50 MCG/ACT NA SUSP: 2.0000 | Freq: Every day | NASAL | 2 refills | Status: DC

## 2020-10-23 NOTE — Progress Notes (Signed)
   Subjective:    Patient ID: Destiny Hughes, female    DOB: [birth year], 75 y.o.   MRN: [id number]  HPI   [name] 05/18/20 --75 year old woman with severe COPD and emphysema.  She also has dementia, GERD, depression, A. fib, chronic pain.  Has been very difficult to get her on a good bronchodilator regimen.  She got financial assistance for [employer] through 2021, likely needs to be renewed if we are going to stay on this. She is still on it - is not sure that it is helping her very much. She coughs every day, dry. She has nasal congestion - started benadryl, off loratadine or Flonase, Atrovent nasal spray - didn't seem to help her. She uses Proair. She no longer smokes.   Lung cancer screening CT done 04/22/2020 reviewed by me, showed a few scattered small pulmonary nodules with an irregular bandlike nodular opacity and peripheral right lower lobe 9 mm (stable to 2018).  This was a RADS 2 study, repeat in 12 months.  [name] 10/23/20 --follow-up visit for 75 year old woman with severe COPD and emphysema.  She recently had hip surgery.  Past medical history also significant for dementia, GERD, depression, A. fib and chronic pain.  I have continued her on Stiolto (has been difficult to get her on an affordable regimen). She is not really taking the Stiolto reliably - sometimes splits the dose. She has albuterol which she uses seldomly.  Her activity level has been lower since the his surgery. She feels more exertional SOB. Lots of nasal drainage   Review of Systems As above     Objective:   Physical Exam Vitals:   10/23/20 1457  BP: 122/70  Pulse: 72  Temp: 98.2 F (36.8 C)  TempSrc: Oral  SpO2: 94%  Weight: 185 lb 12.8 oz (84.3 kg)  Height: 5' 5.5" (1.664 m)   Gen: Pleasant, well-nourished, in no distress  ENT: No lesions,  mouth clear,  oropharynx clear, no postnasal drip  Neck: No JVD, no stridor  Lungs: No use of accessory muscles, decreased bilaterally, no wheezing, no  crackles  Cardiovascular: RRR, heart sounds normal, no murmur or gallops, bilateral knees are swollen, especially on the right  Musculoskeletal: No deformities, no cyanosis or clubbing  Neuro: alert  Moves all extremities, no apparent focal deficits  Skin: Warm, no lesions or rash     Assessment & Plan:  COPD (chronic obstructive pulmonary disease) ([place]) They are getting Stiolto through [facility] cares.  She has been taking it reliably.  I changed this and explained that she needs to take 2 sprays once daily every day on a schedule.  Also explained albuterol and how it should be used as needed.  When she is more mobile I think she will need a walking oximetry to evaluate for recurrent occult exertional hypoxemia.  Chronic rhinitis Reviewed her medication regimen.  She has been using mainly Afrin over-the-counter.  I asked her to restart fluticasone nasal spray daily, ipratropium nasal spray to be used as needed depending on how much congestion she is experiencing.  Explained this to them in detail.     Baltazar Apo, MD, PhD 10/23/2020, 3:21 PM [facility] [phone] or if no answer before 7:00PM call [phone] For any issues after 7:00PM please call eLink [phone]

## 2020-10-23 NOTE — Patient Instructions (Signed)
Please take Stiolto 2 puffs once daily.  Take the 2 puffs at the same time every day as a maintenance medication. Keep your albuterol available to use 2 puffs up to every 4 hours if you need it for shortness of breath, chest tightness, wheezing.  You can take this medication when you get symptoms through the day. Start taking your fluticasone nasal spray 2 sprays each nostril once daily.  This is another maintenance medicine. You can use your ipratropium nasal spray, 2 sprays each nostril up to 2-3 times daily depending on how much nasal congestion you are having.  You can take this medicine through the day depending on symptoms. Try to avoid the Afrin nasal spray if possible. Follow with Dr [name] in 3 months or sooner if you have any problems.

## 2020-10-23 NOTE — Assessment & Plan Note (Signed)
Reviewed her medication regimen.  She has been using mainly Afrin over-the-counter.  I asked her to restart fluticasone nasal spray daily, ipratropium nasal spray to be used as needed depending on how much congestion she is experiencing.  Explained this to them in detail.

## 2020-10-23 NOTE — Assessment & Plan Note (Signed)
They are getting Stiolto through [facility] cares.  She has been taking it reliably.  I changed this and explained that she needs to take 2 sprays once daily every day on a schedule.  Also explained albuterol and how it should be used as needed.  When she is more mobile I think she will need a walking oximetry to evaluate for recurrent occult exertional hypoxemia.

## 2020-10-28 ENCOUNTER — Ambulatory Visit (INDEPENDENT_AMBULATORY_CARE_PROVIDER_SITE_OTHER): Payer: Medicare Other | Admitting: Psychiatry

## 2020-10-28 ENCOUNTER — Other Ambulatory Visit: Payer: Self-pay

## 2020-10-28 DIAGNOSIS — F325 Major depressive disorder, single episode, in full remission: Secondary | ICD-10-CM | POA: Diagnosis not present

## 2020-10-28 MED ORDER — DONEPEZIL HCL 23 MG PO TABS: 10.0000 mg | ORAL_TABLET | Freq: Every day | ORAL | 5 refills | Status: DC

## 2020-10-28 MED ORDER — TEMAZEPAM 30 MG PO CAPS: 30.0000 mg | ORAL_CAPSULE | Freq: Every day | ORAL | 5 refills | Status: DC

## 2020-10-28 MED ORDER — BUPROPION HCL ER (XL) 150 MG PO TB24: 300.0000 mg | ORAL_TABLET | ORAL | 6 refills | Status: DC

## 2020-10-28 NOTE — Progress Notes (Signed)
Psychiatric Initial Adult Assessment   Patient Identification: Destiny Hughes MRN:  [id number] Date of Evaluation:  10/28/2020 Referral Source: [facility] Chief Complaint: Major depression remission  Today the patient is doing fairly well.  She is seen with her husband [name] and her son's.  They did recently have a tragedy in the last month.  The patient is other son died acutely of a brain aneurysm.  It was a sudden death.  They had a memorial and the patient seemed to handle it fairly well.  She cried a great deal but now she is doing much better.  Both her husband and her son says she is doing well.  The patient agrees.  She is sleeping and eating fairly well.  Her sleep is disturbed but we found out that she is actually getting her sleeping medicine way too late.  She gets it at 7:00 at night when she really wants to get up at 10:00.  We will make new arrangements with her son being involved in giving her the medicine or letting her know when it is time to take it at 10:00.  Again the patient is eating well has good energy.  She actually does all her basic ADLs without problem.  Cognitively she seems to be quite stable.  There is some confusion about the Aricept.  It is ordered for 23 mg but she is only taking 10 mg according to her husband.  He also has that she is on amantadine 5 mg twice daily but we do not have that in our chart.  At this time the patient cannot do any institutional ADLs but she does all her basics without problem.  She recently had hip surgery where she will be walking without a problem.  She dresses she goes to the bathroom without problem and she eats on her own and every other way she functions.  She is very interactive.  She is very lucid.  She loves watching television watches crime shows.  The patient really is functioning as well as she was since I saw her.  I do not believe her Mini-Mental status would be projected to be less than 10.  I think she is more  likely 15-20 and therefore not necessarily indicative of needing memantine.  The patient takes her Wellbutrin without problem.  She denies persistent daily depression at this time.  (Hypo) Manic Symptoms:   Anxiety Symptoms:   Psychotic Symptoms:   PTSD Symptoms:   Past Psychiatric History: none  Previous Psychotropic Medications: Yes   Substance Abuse History in the last 12 months:  No.  Consequences of Substance Abuse:   Past Medical History:  Past Medical History:  Diagnosis Date   Arthritis    OA- knees & back  neck   Chronic back pain    Cognitive retention disorder    Collapse of right lung    COPD (chronic obstructive pulmonary disease) ([facility])    Hx. Bronchitis occ, 01-25-11 somes issue now taking  Z-pak-started 01-24-11/Scar tissue  present  from previous lung collapse   Dementia with psychosis ([place])    Depression    Depression    Diabetes mellitus ([place]) 07/08/2020   Dyspnea    Dysrhythmia 2018   treated with Ablation    GERD (gastroesophageal reflux disease)    tx. TUMS   Glaucoma    Headache    History of kidney stones    surgically treated   Hyperlipemia    Hypertension  Hypothyroidism    tx. Levothyroxine   Major depression with psychotic features ([place])    Nephrolithiasis    Neuromuscular disorder ([place]) 01-25-11   Pain/nerve stimulator implanted -2 yrs ago.   Reflex, gag, absent    Reflex, gag, absent     Past Surgical History:  Procedure Laterality Date   A-FLUTTER ABLATION N/A 08/16/2016   Procedure: A-Flutter Ablation;  Surgeon: Evans Lance, MD;  Location: [facility];  Service: Cardiovascular;  Laterality: N/A;   ANTERIOR CERVICAL DECOMP/DISCECTOMY FUSION     3 levels, per family   APPENDECTOMY  01-25-11   CARPAL TUNNEL RELEASE  01-25-11   Bil.   CERVICAL SPINE SURGERY  01-25-11   2005-multiple levels   CHOLECYSTECTOMY  01-25-11   '07   COLONOSCOPY WITH PROPOFOL N/A 07/23/2012   Procedure: COLONOSCOPY WITH PROPOFOL;  Surgeon: Garlan Fair, MD;  Location: [facility];  Service: Endoscopy;  Laterality: N/A;   detatched retna Bilateral    done July & August- 2019   EYE SURGERY Bilateral 2019   repair of retina detachment- Dr. Zadie Rhine at surgical center    JOINT REPLACEMENT  01-25-11   6'03 [place]  01-25-11   '04-right, revised x2   no gag reflex     Pt does not havea gag reflex following  siurgery to neck. Family unsure of date. Pt takes pill in apple sauce.   SPINAL CORD STIMULATOR BATTERY EXCHANGE N/A 01/12/2018   Procedure: Spinal cord stimulator battery replacement;  Surgeon: Clydell Hakim, MD;  Location: [place];  Service: Neurosurgery;  Laterality: N/A;  left   SPINAL CORD STIMULATOR IMPLANT  2009 APPROX   DR. ELSNER   SPINAL CORD STIMULATOR REMOVAL N/A 06/04/2018   Procedure: Removal of spinal cord stimulator, thoracic paddle and generator;  Surgeon: Kristeen Miss, MD;  Location: [facility];  Service: Neurosurgery;  Laterality: N/A;   THORACIC SPINE SURGERY  01-25-11   6'02- then nerve stimulator implanted after   TOTAL HIP ARTHROPLASTY Right 07/29/2020   Procedure: RIGHT TOTAL HIP ARTHROPLASTY ANTERIOR APPROACH;  Surgeon: Marybelle Killings, MD;  Location: [facility];  Service: Orthopedics;  Laterality: Right;   TOTAL KNEE REVISION  02/01/2011   Procedure: TOTAL KNEE REVISION;  Surgeon: Mauri Pole;  Location: [facility];  Service: Orthopedics;  Laterality: Right;   TUBAL LIGATION      Family Psychiatric History:   Family History:  Family History  Problem Relation Age of Onset   Heart attack Father    Emphysema Father    Aneurysm Mother        CEREBRAL   Emphysema Mother    Dementia Mother    Diabetes Son    Hypertension Son    Hypertension Son    Cancer Sister        Widely Metastatic   Asthma Son        x2   Breast cancer Paternal Aunt    Rheum arthritis Maternal Aunt     Social History:   Social History   Socioeconomic History   Marital status: Married    Spouse name: Not on file    Number of children: 2   Years of education: Not on file   Highest education level: Not on file  Occupational History   Occupation: disabled  Tobacco Use   Smoking status: Former    Packs/day: 0.75    Years: 50.00    Pack years: 37.50    Types: Cigarettes  Start date: 09/13/1961    Quit date: 05/2016    Years since quitting: 4.4   Smokeless tobacco: Never   Tobacco comments:    Decreased Down to 2/3 a Day if [place] It  Vaping Use   Vaping Use: Never used  Substance and Sexual Activity   Alcohol use: No    Alcohol/week: 0.0 standard drinks    Comment: none in 10 years   Drug use: No   Sexual activity: Never  Other Topics Concern   Not on file  Social History Narrative   She is from [place]. She has always lived in [place]. She has traveled to [place], [place], [place], [place], & [place]. 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 Prior exposure to parakeets with last exposure remote in her current home. Previously had mold in her home that was in her bathroom & fixed. Prior exposure to hot tubs but none recently. Pt lives at home with [name], husband.  Has 2 childrean, 12th grade education.     Social Determinants of Health   Financial Resource Strain: Low Risk    Difficulty of Paying Living Expenses: Not hard at all  Food Insecurity: No Food Insecurity   Worried About Charity fundraiser in the Last Year: Never true   [place] in the Last Year: Never true  Transportation Needs: No Transportation Needs   Lack of Transportation (Medical): No   Lack of Transportation (Non-Medical): No  Physical Activity: Inactive   Days of Exercise per Week: 0 days   Minutes of Exercise per Session: 0 min  Stress: No Stress Concern Present   Feeling of Stress : Not at all  Social Connections: Moderately Isolated   Frequency of Communication with Friends and Family: More than three times a  week   Frequency of Social Gatherings with Friends and Family: Twice a week   Attends Religious Services: Never   Marine scientist or Organizations: No   Attends Archivist Meetings: Never   Marital Status: Married    Additional Social History:   Allergies:   Allergies  Allergen Reactions   Albuterol Shortness Of Breath and Other (See Comments)   Penicillins Anaphylaxis, Swelling and Other (See Comments)    Has patient had a PCN reaction causing immediate rash, facial/tongue/throat swelling, SOB or lightheadedness with hypotension: Yes Has patient had a PCN reaction causing severe rash involving mucus membranes or skin necrosis: Rash Has patient had a PCN reaction that required hospitalization: No Has patient had a PCN reaction occurring within the last 10 years: No If all of the above answers are "NO", then may proceed with Cephalosporin use.   Sulfa Antibiotics Swelling and Other (See Comments)    Throat swells, but no shortness of breath noted   Codeine Nausea And Vomiting   Ecotrin [Aspirin] Nausea And Vomiting   Zanaflex [Tizanidine] Other (See Comments)    Possible confusion    Metabolic Disorder Labs: Lab Results  Component Value Date   HGBA1C 7.5 (A) 07/08/2020   MPG 217 03/02/2020   MPG 137 (H) 04/28/2015   No results found for: PROLACTIN Lab Results  Component Value Date   CHOL 338 (H) 03/02/2020   TRIG 397 (H) 03/02/2020   HDL 52 03/02/2020   CHOLHDL 6.5 (H) 03/02/2020   VLDL 52.8 (H) 02/21/2019   LDLCALC 219 (H) 03/02/2020  [place] 114 04/28/2015     Current Medications: Current Outpatient Medications  Medication Sig Dispense Refill   acetaminophen (TYLENOL) 500 MG tablet Take 1,000 mg by mouth every 6 (six) hours as needed for moderate pain.     amLODipine (NORVASC) 5 MG tablet Take 1 tablet (5 mg total) by mouth daily. 90 tablet 3   buPROPion (WELLBUTRIN XL) 150 MG 24 hr tablet Take 2 tablets (300 mg total) by mouth every morning. 60  tablet 6   diphenhydrAMINE (BENADRYL) 25 MG tablet Take 25 mg by mouth in the morning.     donepezil (ARICEPT) 10 MG tablet Take 10 mg by mouth at bedtime.     donepezil (ARICEPT) 23 MG TABS tablet Take 0.5 tablets (11.5 mg total) by mouth at bedtime. 30 tablet 5   fluticasone (FLONASE) 50 MCG/ACT nasal spray Place 2 sprays into both nostrils daily. 16 g 2   gabapentin (NEURONTIN) 100 MG capsule TAKE ONE CAPSULE TWICE A DAY 180 capsule 3   ipratropium (ATROVENT) 0.03 % nasal spray PLACE 2 SPRAYS INTO BOTH NOSTRILS EVERY 12 HOURS 30 mL 2   levothyroxine (SYNTHROID) 125 MCG tablet TAKE ONE TABLET AT BEDTIME 90 tablet 0   lisinopril (ZESTRIL) 5 MG tablet Take 1 tablet (5 mg total) by mouth daily. 90 tablet 3   melatonin 5 MG TABS Take 5 mg by mouth at bedtime.     metFORMIN (GLUCOPHAGE) 500 MG tablet TAKE ONE TABLET TWICE DAILY WITH A MEAL 90 tablet 1   pantoprazole (PROTONIX) 40 MG tablet TAKE ONE TABLET DAILY 90 tablet 0   propranolol (INDERAL) 80 MG tablet Take 0.5 tablets (40 mg total) by mouth 3 (three) times daily. 135 tablet 3   rosuvastatin (CRESTOR) 5 MG tablet TAKE ONE TABLET EACH DAY (Patient taking differently: Take 5 mg by mouth daily.) 30 tablet 3   sodium chloride 1 g tablet TAKE ONE TABLET THREE TIMES DAILY 180 tablet 1   temazepam (RESTORIL) 30 MG capsule Take 1 capsule (30 mg total) by mouth at bedtime. 30 capsule 5   Tiotropium Bromide-Olodaterol (STIOLTO RESPIMAT) 2.5-2.5 MCG/ACT AERS Inhale 2 puffs into the lungs daily. 4 g 3   No current facility-administered medications for this visit.    Neurologic: Headache: No Seizure: No Paresthesias:No  Musculoskeletal: Strength & Muscle Tone: decreased Gait & Station: unsteady Patient leans: N/A  Psychiatric Specialty Exam: ROS  There were no vitals taken for this visit.There is no height or weight on file to calculate BMI.  General Appearance: Casual  Eye Contact:  Good  Speech:  Clear and Coherent  Volume:  Normal   Mood:  NA  Affect:  depressed  Thought Process:  Goal Directed  Orientation:  Full (Time, Place, and Person)  Thought Content:  WDL  Suicidal Thoughts:  No  Homicidal Thoughts:  No  Memory:  Immediate;   Poor  Judgement:  Fair  Insight:  Lacking  Psychomotor Activity:  Normal  Concentration:    Recall:  Poor  Fund of Knowledge:Poor  Language: Fair  Akathisia:  No  Handed:  Right  AIMS (if indicated):   Assets:  Communication Skills  ADL's:  Impaired  Cognition: Impaired,  Moderate  Sleep:      Treatment Plan Summary:  This patient's first problem is that of major clinical depression.  She takes Wellbutrin and has a good response.  Her second problem is that of insomnia.  The patient takes 30 mg of temazepam and for the most part gets  a reasonably good night of sleep as long she takes it at the right time.  Her third problem is that of Alzheimer's dementia.  We will clarify that she is supposed to be taking Aricept 23 mg.  At this time I do not recommend that she starts on Namenda.  This patient she will return to see me in 3 months.  I think she is very stable and functioning reasonably well.

## 2020-11-09 ENCOUNTER — Other Ambulatory Visit: Payer: Self-pay | Admitting: Family Medicine

## 2020-11-09 DIAGNOSIS — E782 Mixed hyperlipidemia: Secondary | ICD-10-CM

## 2020-11-18 ENCOUNTER — Telehealth: Payer: Self-pay | Admitting: *Deleted

## 2020-11-18 NOTE — Telephone Encounter (Signed)
Ortho bundle 90 day call completed with survey.

## 2020-12-01 ENCOUNTER — Encounter: Payer: Self-pay | Admitting: Orthopaedic Surgery

## 2020-12-01 ENCOUNTER — Other Ambulatory Visit: Payer: Self-pay

## 2020-12-01 ENCOUNTER — Ambulatory Visit (INDEPENDENT_AMBULATORY_CARE_PROVIDER_SITE_OTHER): Payer: Medicare Other | Admitting: Orthopaedic Surgery

## 2020-12-01 VITALS — BP 165/75 | Ht 65.5 in | Wt 188.0 lb

## 2020-12-01 DIAGNOSIS — M1712 Unilateral primary osteoarthritis, left knee: Secondary | ICD-10-CM

## 2020-12-01 MED ORDER — LIDOCAINE HCL 1 % IJ SOLN
0.5000 mL | INTRAMUSCULAR | Status: AC | PRN
  Administered 2020-12-01: .5 mL

## 2020-12-01 MED ORDER — BUPIVACAINE HCL 0.5 % IJ SOLN
3.0000 mL | INTRAMUSCULAR | Status: AC | PRN
  Administered 2020-12-01: 3 mL via INTRA_ARTICULAR

## 2020-12-01 MED ORDER — METHYLPREDNISOLONE ACETATE 40 MG/ML IJ SUSP
40.0000 mg | INTRAMUSCULAR | Status: AC | PRN
  Administered 2020-12-01: 40 mg via INTRA_ARTICULAR

## 2020-12-01 NOTE — Progress Notes (Signed)
Office Visit Note   Patient: Destiny Hughes           Date of Birth: 08-Mar-1946           MRN: [id number] Visit Date: 12/01/2020              Requested by: Billie Ruddy, MD [place],  [place] [id number] PCP: Billie Ruddy, MD   Assessment & Plan: Visit Diagnoses:  1. Unilateral primary osteoarthritis, left knee     Plan: Previous x-rays of her left knee in 2021 showed medial joint line narrowing marginal osteophytes.  Injection performed which she tolerated fairly well.  She is off narcotic medication.  We can follow her up as needed.  Follow-Up Instructions: Return if symptoms worsen or fail to improve.   Orders:  Orders Placed This Encounter  Procedures   Large Joint Inj   No orders of the defined types were placed in this encounter.     Procedures: Large Joint Inj: L knee on 12/01/2020 1:40 PM Indications: joint swelling and pain Details: 22 G 1.5 in needle, anterolateral approach  Arthrogram: No  Medications: 0.5 mL lidocaine 1 %; 3 mL bupivacaine 0.5 %; 40 mg methylPREDNISolone acetate 40 MG/ML Outcome: tolerated well, no immediate complications Procedure, treatment alternatives, risks and benefits explained, specific risks discussed. Consent was given by the patient. Immediately prior to procedure a time out was called to verify the correct patient, procedure, equipment, support staff and site/side marked as required. Patient was prepped and draped in the usual sterile fashion.      Clinical Data: No additional findings.   Subjective: Chief Complaint  Patient presents with   Right Hip - Follow-up    07/29/2020 Right THA    HPI post right total of arthroplasty 07/29/2020.  She thinks she got improvement in her hip pain still has problems with the right knee that had several surgeries and last attempted revision showed the components were solid and cannot be removed.  Left knee still bothering her and we discussed knee injection  today.  She has osteoarthritis present on previous images.  She is not had an injection.  Review of Systems updated unchanged.  Of note is mitral valve prolapse history of SVT previous spinal cord stimulator dysfunction right knee revision x2.   Objective: Vital Signs: BP (!) 165/75   Ht 5' 5.5" (1.664 m)   Wt 188 lb (85.3 kg)   BMI 30.81 kg/m   Physical Exam Constitutional:      Appearance: She is well-developed.  HENT:     Head: Normocephalic.     Right Ear: External ear normal.     Left Ear: External ear normal. There is no impacted cerumen.  Eyes:     Pupils: Pupils are equal, round, and reactive to light.  Neck:     Thyroid: No thyromegaly.     Trachea: No tracheal deviation.  Cardiovascular:     Rate and Rhythm: Normal rate.  Pulmonary:     Effort: Pulmonary effort is normal.  Abdominal:     Palpations: Abdomen is soft.  Musculoskeletal:     Cervical back: No rigidity.  Skin:    General: Skin is warm and dry.  Neurological:     Mental Status: She is alert and oriented to person, place, and time.  Psychiatric:        Behavior: Behavior normal.    Ortho Exam left knee crepitus with range of motion.  Tenderness along the  medial joint line.  Specialty Comments:  No specialty comments available.  Imaging: No results found.   PMFS History: Patient Active Problem List   Diagnosis Date Noted   Status post total hip replacement, right 07/29/2020   Diabetes mellitus ([place]) 07/08/2020   Unilateral primary osteoarthritis, left knee 03/30/2020   History of fusion of cervical spine 07/03/2019   Spinal cord stimulator dysfunction ([place]) 06/04/2018   Chronic cough 05/21/2018   Tremor 02/16/2018   Chronic rhinitis 02/05/2018   Hallucinations    Psychosis ([place]) 11/07/2016   Acute respiratory failure with hypoxia and hypercapnia ([facility]) 11/03/2016   Other chest pain 10/23/2016   Tobacco abuse 10/23/2016   Major depression with psychotic features ([place]) 10/19/2016    Dementia with behavioral disturbance ([place]) 10/19/2016   Depression due to physical illness    Cognitive retention disorder    Atrial flutter ([place]) 08/15/2016   Paroxysmal atrial fibrillation ([place]) 08/15/2016   SVT (supraventricular tachycardia) ([place]) 07/11/2016   Weight loss 07/08/2016   AKI (acute kidney injury) ([place]) 07/02/2016   Acute kidney injury ([place]) 07/01/2016   Hyperglycemia 07/01/2016   Hyponatremia 07/01/2016   Intractable nausea and vomiting 07/01/2016   Abnormal EKG [id number]   Diastolic CHF ([place]) [id number]   Medication management 04/28/2015   Emphysema lung ([place]) 12/26/2014   Gastroesophageal reflux disease without esophagitis 12/26/2014   Chronic bronchitis ([place]) 11/13/2014   COPD (chronic obstructive pulmonary disease) ([place]) 10/03/2014   At high risk for falls 10/03/2014   Other specified hypothyroidism 10/03/2014   Mitral valve prolapse 10/03/2014   Encounter for Medicare annual wellness exam 10/03/2014   IBS (irritable bowel syndrome) 04/01/2014   S/P total knee replacement 04/22/2013   Knee pain, chronic 04/22/2013   Essential hypertension 04/15/2013   Vitamin D deficiency 04/15/2013   Encounter for long-term (current) use of medications 04/15/2013   Prediabetes 04/15/2013   Hyperlipemia    Glaucoma    Chronic pain disorder    Chronic back pain    Past Medical History:  Diagnosis Date   Arthritis    OA- knees & back  neck   Chronic back pain    Cognitive retention disorder    Collapse of right lung    COPD (chronic obstructive pulmonary disease) ([facility])    Hx. Bronchitis occ, 01-25-11 somes issue now taking  Z-pak-started 01-24-11/Scar tissue  present  from previous lung collapse   Dementia with psychosis ([place])    Depression    Depression    Diabetes mellitus ([place]) 07/08/2020   Dyspnea    Dysrhythmia 2018   treated with Ablation    GERD (gastroesophageal reflux disease)    tx. TUMS   Glaucoma    Headache    History of kidney stones    surgically  treated   Hyperlipemia    Hypertension    Hypothyroidism    tx. Levothyroxine   Major depression with psychotic features ([place])    Nephrolithiasis    Neuromuscular disorder ([place]) 01-25-11   Pain/nerve stimulator implanted -2 yrs ago.   Reflex, gag, absent    Reflex, gag, absent     Family History  Problem Relation Age of Onset   Heart attack Father    Emphysema Father    Aneurysm Mother        CEREBRAL   Emphysema Mother    Dementia Mother    Diabetes Son    Hypertension Son    Hypertension Son    Cancer Sister  Widely Metastatic   Asthma Son        x2   Breast cancer Paternal Aunt    Rheum arthritis Maternal Aunt     Past Surgical History:  Procedure Laterality Date   A-FLUTTER ABLATION N/A 08/16/2016   Procedure: A-Flutter Ablation;  Surgeon: Evans Lance, MD;  Location: [facility];  Service: Cardiovascular;  Laterality: N/A;   ANTERIOR CERVICAL DECOMP/DISCECTOMY FUSION     3 levels, per family   APPENDECTOMY  01-25-11   CARPAL TUNNEL RELEASE  01-25-11   Bil.   CERVICAL SPINE SURGERY  01-25-11   2005-multiple levels   CHOLECYSTECTOMY  01-25-11   '07   COLONOSCOPY WITH PROPOFOL N/A 07/23/2012   Procedure: COLONOSCOPY WITH PROPOFOL;  Surgeon: Garlan Fair, MD;  Location: [facility];  Service: Endoscopy;  Laterality: N/A;   detatched retna Bilateral    done July & August- 2019   EYE SURGERY Bilateral 2019   repair of retina detachment- Dr. Zadie Rhine at surgical center    JOINT REPLACEMENT  01-25-11   6'03 [place]  01-25-11   '04-right, revised x2   no gag reflex     Pt does not havea gag reflex following  siurgery to neck. Family unsure of date. Pt takes pill in apple sauce.   SPINAL CORD STIMULATOR BATTERY EXCHANGE N/A 01/12/2018   Procedure: Spinal cord stimulator battery replacement;  Surgeon: Clydell Hakim, MD;  Location: [facility];  Service: Neurosurgery;  Laterality: N/A;  left   SPINAL CORD STIMULATOR IMPLANT  2009 APPROX    DR. ELSNER   SPINAL CORD STIMULATOR REMOVAL N/A 06/04/2018   Procedure: Removal of spinal cord stimulator, thoracic paddle and generator;  Surgeon: Kristeen Miss, MD;  Location: [facility];  Service: Neurosurgery;  Laterality: N/A;   THORACIC SPINE SURGERY  01-25-11   6'02- then nerve stimulator implanted after   TOTAL HIP ARTHROPLASTY Right 07/29/2020   Procedure: RIGHT TOTAL HIP ARTHROPLASTY ANTERIOR APPROACH;  Surgeon: Marybelle Killings, MD;  Location: [facility];  Service: Orthopedics;  Laterality: Right;   TOTAL KNEE REVISION  02/01/2011   Procedure: TOTAL KNEE REVISION;  Surgeon: Mauri Pole;  Location: [facility];  Service: Orthopedics;  Laterality: Right;   TUBAL LIGATION     Social History   Occupational History   Occupation: disabled  Tobacco Use   Smoking status: Former    Packs/day: 0.75    Years: 50.00    Pack years: 37.50    Types: Cigarettes    Start date: 09/13/1961    Quit date: 05/2016    Years since quitting: 4.5   Smokeless tobacco: Never   Tobacco comments:    Decreased Down to 2/3 a Day if [place] It  Vaping Use   Vaping Use: Never used  Substance and Sexual Activity   Alcohol use: No    Alcohol/week: 0.0 standard drinks    Comment: none in 10 years   Drug use: No   Sexual activity: Never

## 2020-12-24 ENCOUNTER — Ambulatory Visit (INDEPENDENT_AMBULATORY_CARE_PROVIDER_SITE_OTHER): Payer: Medicare Other

## 2020-12-24 ENCOUNTER — Other Ambulatory Visit: Payer: Self-pay

## 2020-12-24 DIAGNOSIS — Z23 Encounter for immunization: Secondary | ICD-10-CM

## 2020-12-29 ENCOUNTER — Other Ambulatory Visit: Payer: Self-pay | Admitting: Family Medicine

## 2020-12-29 DIAGNOSIS — E1169 Type 2 diabetes mellitus with other specified complication: Secondary | ICD-10-CM

## 2021-01-09 ENCOUNTER — Other Ambulatory Visit: Payer: Self-pay | Admitting: Family Medicine

## 2021-01-22 ENCOUNTER — Other Ambulatory Visit: Payer: Self-pay

## 2021-01-22 ENCOUNTER — Ambulatory Visit (INDEPENDENT_AMBULATORY_CARE_PROVIDER_SITE_OTHER): Payer: Medicare Other | Admitting: Emergency Medicine

## 2021-01-22 ENCOUNTER — Encounter: Payer: Self-pay | Admitting: Emergency Medicine

## 2021-01-22 DIAGNOSIS — J449 Chronic obstructive pulmonary disease, unspecified: Secondary | ICD-10-CM

## 2021-01-22 MED ORDER — ALBUTEROL SULFATE (2.5 MG/3ML) 0.083% IN NEBU: 2.5000 mg | INHALATION_SOLUTION | Freq: Four times a day (QID) | RESPIRATORY_TRACT | 12 refills | Status: DC | PRN

## 2021-01-22 NOTE — Assessment & Plan Note (Signed)
Please continue your Stiolto 2 puffs once daily. You can use your albuterol 2 puffs up to every 4 hours if needed for shortness of breath, chest tightness, wheezing. We will order albuterol nebs for you.  You can use 1 nebulizer treatment up to every 4 hours if you need it for shortness of breath. Flu shot up-to-date COVID-19 vaccine is up-to-date.  You would be a good candidate to get the new COVID-19 booster this fall. Follow with Dr [name] in very after your CT chest so that we can review together.

## 2021-01-22 NOTE — Progress Notes (Signed)
Subjective:    Patient ID: Destiny Hughes, female    DOB: [birth year], 75 y.o.   MRN: [id number]  HPI   [name] 05/18/20 --75 year old woman with severe COPD and emphysema.  She also has dementia, GERD, depression, A. fib, chronic pain.  Has been very difficult to get her on a good bronchodilator regimen.  She got financial assistance for [employer] through 2021, likely needs to be renewed if we are going to stay on this. She is still on it - is not sure that it is helping her very much. She coughs every day, dry. She has nasal congestion - started benadryl, off loratadine or Flonase, Atrovent nasal spray - didn't seem to help her. She uses Proair. She no longer smokes.   Lung cancer screening CT done 04/22/2020 reviewed by me, showed a few scattered small pulmonary nodules with an irregular bandlike nodular opacity and peripheral right lower lobe 9 mm (stable to 2018).  This was a RADS 2 study, repeat in 12 months.  [name] 10/23/20 --follow-up visit for 75 year old woman with severe COPD and emphysema.  She recently had hip surgery.  Past medical history also significant for dementia, GERD, depression, A. fib and chronic pain.  I have continued her on Stiolto (has been difficult to get her on an affordable regimen). She is not really taking the Stiolto reliably - sometimes splits the dose. She has albuterol which she uses seldomly.  Her activity level has been lower since the his surgery. She feels more exertional SOB. Lots of nasal drainage  [name] 01/22/21 --Destiny Hughes is 13.  She has a history of severe emphysematous COPD.  Also with dementia, GERD, depression, A. fib, chronic pain.  She has been managed on Stiolto although not always able to get it reliably.  She has chronic rhinitis, has been using over-the-counter Afrin.  Last visit I asked her to try to start fluticasone nasal spray, ipratropium nasal spray as needed. Her activity is quite limited. She is using albuterol 1-2x a day.  She is interested in  getting albuterol nebs to add on to her existing regimen. No flares, no pred. Flu shot up to date. Due for the COVID booster.    Review of Systems As above     Objective:   Physical Exam Vitals:   01/22/21 1501  BP: (!) 122/56  Pulse: 70  Temp: 98 F (36.7 C)  TempSrc: Oral  SpO2: 95%  Weight: 187 lb (84.8 kg)  Height: 5\' 6"  (1.676 m)   Gen: Pleasant, well-nourished, in no distress  ENT: No lesions,  mouth clear,  oropharynx clear, no postnasal drip  Neck: No JVD, no stridor  Lungs: No use of accessory muscles, decreased bilaterally, no wheezing, no crackles  Cardiovascular: RRR, heart sounds normal, no murmur or gallops, bilateral knees are swollen, especially on the right  Musculoskeletal: No deformities, no cyanosis or clubbing  Neuro: alert  Moves all extremities, no apparent focal deficits  Skin: Warm, no lesions or rash     Assessment & Plan:  COPD (chronic obstructive pulmonary disease) ([place]) Please continue your Stiolto 2 puffs once daily. You can use your albuterol 2 puffs up to every 4 hours if needed for shortness of breath, chest tightness, wheezing. We will order albuterol nebs for you.  You can use 1 nebulizer treatment up to every 4 hours if you need it for shortness of breath. Flu shot up-to-date COVID-19 vaccine is up-to-date.  You would be a good candidate to get the  new COVID-19 booster this fall. Follow with Dr [name] in very after your CT chest so that we can review together.  Chronic rhinitis Continue your fluticasone nose spray, 2 sprays each nostril once daily. You can use your ipratropium (Atrovent) nasal spray 2 sprays each nostril 2-3 times daily as you need it for congestion and drainage   Tobacco abuse Get your lung cancer screening CT scan in February as planned     Baltazar Apo, MD, PhD 01/22/2021, 3:24 PM [facility] [phone] or if no answer before 7:00PM call [phone] For any issues after  7:00PM please call eLink [phone]

## 2021-01-22 NOTE — Assessment & Plan Note (Signed)
Get your lung cancer screening CT scan in February as planned

## 2021-01-22 NOTE — Assessment & Plan Note (Signed)
Continue your fluticasone nose spray, 2 sprays each nostril once daily. You can use your ipratropium (Atrovent) nasal spray 2 sprays each nostril 2-3 times daily as you need it for congestion and drainage

## 2021-01-22 NOTE — Patient Instructions (Addendum)
Please continue your Stiolto 2 puffs once daily. You can use your albuterol 2 puffs up to every 4 hours if needed for shortness of breath, chest tightness, wheezing. We will order albuterol nebs for you.  You can use 1 nebulizer treatment up to every 4 hours if you need it for shortness of breath. Continue your fluticasone nose spray, 2 sprays each nostril once daily. You can use your ipratropium (Atrovent) nasal spray 2 sprays each nostril 2-3 times daily as you need it for congestion and drainage Get your lung cancer screening CT scan in February as planned Flu shot up-to-date COVID-19 vaccine is up-to-date.  You would be a good candidate to get the new COVID-19 booster this fall. Follow with Dr [name] in very after your CT chest so that we can review together.

## 2021-01-27 ENCOUNTER — Telehealth (HOSPITAL_BASED_OUTPATIENT_CLINIC_OR_DEPARTMENT_OTHER): Payer: Medicare Other | Admitting: Psychiatry

## 2021-01-27 ENCOUNTER — Other Ambulatory Visit: Payer: Self-pay

## 2021-01-27 DIAGNOSIS — F339 Major depressive disorder, recurrent, unspecified: Secondary | ICD-10-CM | POA: Diagnosis not present

## 2021-01-27 MED ORDER — MEMANTINE HCL 28 X 5 MG & 21 X 10 MG PO TABS: ORAL_TABLET | ORAL | 12 refills | Status: DC

## 2021-01-27 MED ORDER — DONEPEZIL HCL 23 MG PO TABS: 10.0000 mg | ORAL_TABLET | Freq: Every day | ORAL | 5 refills | Status: DC

## 2021-01-27 MED ORDER — TEMAZEPAM 30 MG PO CAPS: 30.0000 mg | ORAL_CAPSULE | Freq: Every day | ORAL | 5 refills | Status: DC

## 2021-01-27 MED ORDER — BUPROPION HCL ER (XL) 150 MG PO TB24: 150.0000 mg | ORAL_TABLET | ORAL | 6 refills | Status: DC

## 2021-01-27 NOTE — Progress Notes (Signed)
Psychiatric Initial Adult Assessment   Patient Identification: Destiny Hughes MRN:  [id number] Date of Evaluation:  01/27/2021 Referral Source: [facility] Chief Complaint: Major depression remission  Today the patient is not doing all that well.  Her son died months ago and yet she is still feeling depressed in fact it has gotten worse.  Now she is not sleeping well.  Note is the patient has a Mini-Mental status in the moderate range.  Today we are going to increase her Wellbutrin to the dose 150 mg 3 every morning.  Today we can add Namenda as well.  She is eating okay and she is not psychotic.  She drinks no alcohol do no drugs.  Today we interviewed her together with her husband [name].  For reasons that are not clear the patient was not here to see me.  She will return to see me in 6 to 7 weeks in person.  Overall the patient is functioning actually pretty well.  She is not suicidal.  She drinks no alcohol and uses no drugs.  (Hypo) Manic Symptoms:   Anxiety Symptoms:   Psychotic Symptoms:   PTSD Symptoms:   Past Psychiatric History: none  Previous Psychotropic Medications: Yes   Substance Abuse History in the last 12 months:  No.  Consequences of Substance Abuse:   Past Medical History:  Past Medical History:  Diagnosis Date   Arthritis    OA- knees & back  neck   Chronic back pain    Cognitive retention disorder    Collapse of right lung    COPD (chronic obstructive pulmonary disease) (HCC)    Hx. Bronchitis occ, 01-25-11 somes issue now taking  Z-pak-started 01-24-11/Scar tissue  present  from previous lung collapse   Dementia with psychosis    Depression    Depression    Diabetes mellitus ([place]) 07/08/2020   Dyspnea    Dysrhythmia 2018   treated with Ablation    GERD (gastroesophageal reflux disease)    tx. TUMS   Glaucoma    Headache    History of kidney stones    surgically treated   Hyperlipemia    Hypertension    Hypothyroidism    tx.  Levothyroxine   Major depression with psychotic features ([place])    Nephrolithiasis    Neuromuscular disorder ([place]) 01-25-11   Pain/nerve stimulator implanted -2 yrs ago.   Reflex, gag, absent    Reflex, gag, absent     Past Surgical History:  Procedure Laterality Date   A-FLUTTER ABLATION N/A 08/16/2016   Procedure: A-Flutter Ablation;  Surgeon: Evans Lance, MD;  Location: [facility];  Service: Cardiovascular;  Laterality: N/A;   ANTERIOR CERVICAL DECOMP/DISCECTOMY FUSION     3 levels, per family   APPENDECTOMY  01-25-11   CARPAL TUNNEL RELEASE  01-25-11   [facility].   CERVICAL SPINE SURGERY  01-25-11   2005-multiple levels   CHOLECYSTECTOMY  01-25-11   '07   COLONOSCOPY WITH PROPOFOL N/A 07/23/2012   Procedure: COLONOSCOPY WITH PROPOFOL;  Surgeon: Garlan Fair, MD;  Location: [facility];  Service: Endoscopy;  Laterality: N/A;   detatched retna Bilateral    done July & August- 2019   EYE SURGERY Bilateral 2019   repair of retina detachment- Dr. Zadie Rhine at surgical center    JOINT REPLACEMENT  01-25-11   6'03 [place]  01-25-11   '04-right, revised x2   no gag reflex     Pt does  not havea gag reflex following  siurgery to neck. Family unsure of date. Pt takes pill in apple sauce.   SPINAL CORD STIMULATOR BATTERY EXCHANGE N/A 01/12/2018   Procedure: Spinal cord stimulator battery replacement;  Surgeon: Clydell Hakim, MD;  Location: [facility];  Service: Neurosurgery;  Laterality: N/A;  left   SPINAL CORD STIMULATOR IMPLANT  2009 APPROX   DR. ELSNER   SPINAL CORD STIMULATOR REMOVAL N/A 06/04/2018   Procedure: Removal of spinal cord stimulator, thoracic paddle and generator;  Surgeon: Kristeen Miss, MD;  Location: [facility];  Service: Neurosurgery;  Laterality: N/A;   THORACIC SPINE SURGERY  01-25-11   6'02- then nerve stimulator implanted after   TOTAL HIP ARTHROPLASTY Right 07/29/2020   Procedure: RIGHT TOTAL HIP ARTHROPLASTY ANTERIOR APPROACH;  Surgeon: Marybelle Killings, MD;  Location: [facility];  Service: Orthopedics;  Laterality: Right;   TOTAL KNEE REVISION  02/01/2011   Procedure: TOTAL KNEE REVISION;  Surgeon: Mauri Pole;  Location: [facility];  Service: Orthopedics;  Laterality: Right;   TUBAL LIGATION      Family Psychiatric History:   Family History:  Family History  Problem Relation Age of Onset   Heart attack Father    Emphysema Father    Aneurysm Mother        CEREBRAL   Emphysema Mother    Dementia Mother    Diabetes Son    Hypertension Son    Hypertension Son    Cancer Sister        Widely Metastatic   Asthma Son        x2   Breast cancer Paternal Aunt    Rheum arthritis Maternal Aunt     Social History:   Social History   Socioeconomic History   Marital status: Married    Spouse name: Not on file   Number of children: 2   Years of education: Not on file   Highest education level: Not on file  Occupational History   Occupation: disabled  Tobacco Use   Smoking status: Former    Packs/day: 0.75    Years: 50.00    Pack years: 37.50    Types: Cigarettes    Start date: 09/13/1961    Quit date: 05/2016    Years since quitting: 4.6   Smokeless tobacco: Never   Tobacco comments:    Decreased Down to 2/3 a Day if Thinking [place] It  Vaping Use   Vaping Use: Never used  Substance and Sexual Activity   Alcohol use: No    Alcohol/week: 0.0 standard drinks    Comment: none in 10 years   Drug use: No   Sexual activity: Never  Other Topics Concern   Not on file  Social History Narrative   She is from [place]. She has always lived in [place]. She has traveled to [place], [place], [place], [place], & [place]. 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 Prior exposure to parakeets with last exposure remote in her current home. Previously had mold in her home that was in her bathroom & fixed. Prior exposure to hot tubs but none recently. Pt lives  at home with [name], husband.  Has 2 childrean, 12th grade education.     Social Determinants of Health   Financial Resource Strain: Low Risk    Difficulty of Paying Living Expenses: Not hard at all  Food Insecurity: No  Food Insecurity   Worried About Charity fundraiser in the Last Year: Never true   Ran Out of Food in the Last Year: Never true  Transportation Needs: No Transportation Needs   Lack of Transportation (Medical): No   Lack of Transportation (Non-Medical): No  Physical Activity: Inactive   Days of Exercise per Week: 0 days   Minutes of Exercise per Session: 0 min  Stress: No Stress Concern Present   Feeling of Stress : Not at all  Social Connections: Moderately Isolated   Frequency of Communication with Friends and Family: More than three times a week   Frequency of Social Gatherings with Friends and Family: Twice a week   Attends Religious Services: Never   Marine scientist or Organizations: No   Attends Archivist Meetings: Never   Marital Status: Married    Additional Social History:   Allergies:   Allergies  Allergen Reactions   Albuterol Shortness Of Breath and Other (See Comments)   Penicillins Anaphylaxis, Swelling and Other (See Comments)    Has patient had a PCN reaction causing immediate rash, facial/tongue/throat swelling, SOB or lightheadedness with hypotension: Yes Has patient had a PCN reaction causing severe rash involving mucus membranes or skin necrosis: Rash Has patient had a PCN reaction that required hospitalization: No Has patient had a PCN reaction occurring within the last 10 years: No If all of the above answers are "NO", then may proceed with Cephalosporin use.   Sulfa Antibiotics Swelling and Other (See Comments)    Throat swells, but no shortness of breath noted   Codeine Nausea And Vomiting   Ecotrin [Aspirin] Nausea And Vomiting   Zanaflex [Tizanidine] Other (See Comments)    Possible confusion    Metabolic  Disorder Labs: Lab Results  Component Value Date   HGBA1C 7.5 (A) 07/08/2020   MPG 217 03/02/2020   MPG 137 (H) 04/28/2015   No results found for: PROLACTIN Lab Results  Component Value Date   CHOL 338 (H) 03/02/2020   TRIG 397 (H) 03/02/2020   HDL 52 03/02/2020   CHOLHDL 6.5 (H) 03/02/2020   VLDL 52.8 (H) 02/21/2019   LDLCALC 219 (H) 03/02/2020   LDLCALC 114 04/28/2015     Current Medications: Current Outpatient Medications  Medication Sig Dispense Refill   memantine (NAMENDA TITRATION PAK) tablet pack 5 mg/day for =1 week; 5 mg twice daily for =1 week; 15 mg/day given in 5 mg and 10 mg separated doses for =1 week; then 10 mg twice daily 49 tablet 12   acetaminophen (TYLENOL) 500 MG tablet Take 1,000 mg by mouth every 6 (six) hours as needed for moderate pain.     albuterol (PROVENTIL) (2.5 MG/3ML) 0.083% nebulizer solution Take 3 mLs (2.5 mg total) by nebulization every 6 (six) hours as needed for wheezing or shortness of breath. 75 mL 12   amLODipine (NORVASC) 5 MG tablet Take 1 tablet (5 mg total) by mouth daily. 90 tablet 3   buPROPion (WELLBUTRIN XL) 150 MG 24 hr tablet Take 1 tablet (150 mg total) by mouth every morning. 3  qam 90 tablet 6   diphenhydrAMINE (BENADRYL) 25 MG tablet Take 25 mg by mouth in the morning.     donepezil (ARICEPT) 23 MG TABS tablet Take 0.5 tablets (11.5 mg total) by mouth at bedtime. 30 tablet 5   fluticasone (FLONASE) 50 MCG/ACT nasal spray Place 2 sprays into both nostrils daily. 16 g 2   gabapentin (NEURONTIN) 100 MG capsule  TAKE ONE CAPSULE TWICE A DAY 180 capsule 3   ipratropium (ATROVENT) 0.03 % nasal spray PLACE 2 SPRAYS INTO BOTH NOSTRILS EVERY 12 HOURS 30 mL 2   levothyroxine (SYNTHROID) 125 MCG tablet TAKE ONE TABLET AT BEDTIME 90 tablet 0   lisinopril (ZESTRIL) 5 MG tablet Take 1 tablet (5 mg total) by mouth daily. 90 tablet 3   melatonin 5 MG TABS Take 5 mg by mouth at bedtime.     metFORMIN (GLUCOPHAGE) 500 MG tablet TAKE ONE TABLET  TWICE DAILY WITH A MEAL 90 tablet 1   pantoprazole (PROTONIX) 40 MG tablet TAKE ONE TABLET DAILY 90 tablet 0   propranolol (INDERAL) 80 MG tablet Take 0.5 tablets (40 mg total) by mouth 3 (three) times daily. 135 tablet 3   rosuvastatin (CRESTOR) 5 MG tablet TAKE ONE TABLET EACH DAY 30 tablet 3   sodium chloride 1 g tablet TAKE ONE TABLET THREE TIMES DAILY 180 tablet 1   temazepam (RESTORIL) 30 MG capsule Take 1 capsule (30 mg total) by mouth at bedtime. 30 capsule 5   Tiotropium Bromide-Olodaterol (STIOLTO RESPIMAT) 2.5-2.5 MCG/ACT AERS Inhale 2 puffs into the lungs daily. 4 g 3   No current facility-administered medications for this visit.    Neurologic: Headache: No Seizure: No Paresthesias:No  Musculoskeletal: Strength & Muscle Tone: decreased Gait & Station: unsteady Patient leans: N/A  Psychiatric Specialty Exam: ROS  There were no vitals taken for this visit.There is no height or weight on file to calculate BMI.  General Appearance: Casual  Eye Contact:  Good  Speech:  Clear and Coherent  Volume:  Normal  Mood:  NA  Affect:  depressed  Thought Process:  Goal Directed  Orientation:  Full (Time, Place, and Person)  Thought Content:  WDL  Suicidal Thoughts:  No  Homicidal Thoughts:  No  Memory:  Immediate;   Poor  Judgement:  Fair  Insight:  Lacking  Psychomotor Activity:  Normal  Concentration:    Recall:  Poor  Fund of Knowledge:Poor  Language: Fair  Akathisia:  No  Handed:  Right  AIMS (if indicated):   Assets:  Communication Skills  ADL's:  Impaired  Cognition: Impaired,  Moderate  Sleep:      Treatment Plan Summary:  Today the patient has major depression and I think it is worsened.  We will increase her Wellbutrin from 300 mg up to 450.  Her second problem is insomnia.  She will still continue taking Restoril we will change it yet.  Her third problem is Alzheimer's dementia.  She will continue taking Aricept 23 mg and today we will add Namenda.  This  patient will be seen again in approximately 6 weeks.  She will be seen in person.

## 2021-01-28 ENCOUNTER — Telehealth (HOSPITAL_COMMUNITY): Payer: Self-pay | Admitting: *Deleted

## 2021-01-28 NOTE — Telephone Encounter (Signed)
PA for Memantine Titration Pak submitted vis [employer]. PA #-[id number]. Awaiting determination.

## 2021-02-01 ENCOUNTER — Other Ambulatory Visit: Payer: Self-pay | Admitting: Family Medicine

## 2021-02-01 ENCOUNTER — Other Ambulatory Visit: Payer: Self-pay | Admitting: Adult Health

## 2021-02-10 ENCOUNTER — Telehealth (HOSPITAL_COMMUNITY): Payer: Self-pay | Admitting: *Deleted

## 2021-02-10 NOTE — Telephone Encounter (Signed)
PA for Memantine HCL tablets denied by [facility] due to medication being nonformulary and not trying all medications on the required medication list.

## 2021-02-17 ENCOUNTER — Telehealth: Payer: Self-pay | Admitting: Family Medicine

## 2021-02-17 NOTE — Telephone Encounter (Signed)
Left message for patient to call back and schedule Medicare Annual Wellness Visit (AWV) either virtually or in office. Left  my Herbie Drape number [phone]   Last AWV 02/27/20 please schedule at anytime with [facility] Nurse Health Advisor 1 or 2   This should be a 45 minute visit.

## 2021-03-01 ENCOUNTER — Ambulatory Visit (INDEPENDENT_AMBULATORY_CARE_PROVIDER_SITE_OTHER): Payer: Medicare Other

## 2021-03-01 VITALS — Ht 66.5 in | Wt 182.0 lb

## 2021-03-01 DIAGNOSIS — Z0000 Encounter for general adult medical examination without abnormal findings: Secondary | ICD-10-CM | POA: Diagnosis not present

## 2021-03-01 NOTE — Progress Notes (Signed)
I connected with Guerry Bruin today by telephone and verified that I am speaking with the correct person using two identifiers. Location patient: home Location provider: work Persons participating in the virtual visit: Guerry Bruin, Immunologist (husband), Glenna Durand LPN.   I discussed the limitations, risks, security and privacy concerns of performing an evaluation and management service by telephone and the availability of in person appointments. I also discussed with the patient that there may be a patient responsible charge related to this service. The patient expressed understanding and verbally consented to this telephonic visit.    Interactive audio and video telecommunications were attempted between this provider and patient, however failed, due to patient having technical difficulties OR patient did not have access to video capability.  We continued and completed visit with audio only.     Vital signs may be patient reported or missing.  Subjective:   Destiny Hughes is a 75 y.o. female who presents for Medicare Annual (Subsequent) preventive examination.  Review of Systems     Cardiac Risk Factors include: advanced age (>66men, >59 women);diabetes mellitus;dyslipidemia;hypertension     Objective:    Today's Vitals   03/01/21 1027  Weight: 182 lb (82.6 kg)  Height: 5' 6.5" (1.689 m)  PainSc: 2    Body mass index is 28.94 kg/m.  Advanced Directives 03/01/2021 07/29/2020 07/27/2020 06/04/2020 02/27/2020 06/04/2018 06/04/2018  Does Patient Have a Medical Advance Directive? Yes Yes No No Yes Yes Yes  Type of Paramedic of [place];Living will [place];Living will [place];Living will [place];Living will  Does patient want to make changes to medical advance directive? - No - Patient declined - - No - Patient declined No - Patient declined No -  Patient declined  Copy of [place] in Chart? No - copy requested - - - No - copy requested No - copy requested No - copy requested  Would patient like information on creating a medical advance directive? - - No - Patient declined No - Patient declined - - -  Pre-existing out of facility DNR order (yellow form or pink MOST form) - - - - - - -  Some encounter information is confidential and restricted. Go to Review Flowsheets activity to see all data.    Current Medications (verified) Outpatient Encounter Medications as of 03/01/2021  Medication Sig   acetaminophen (TYLENOL) 500 MG tablet Take 1,000 mg by mouth every 6 (six) hours as needed for moderate pain.   albuterol (PROVENTIL) (2.5 MG/3ML) 0.083% nebulizer solution Take 3 mLs (2.5 mg total) by nebulization every 6 (six) hours as needed for wheezing or shortness of breath.   albuterol (VENTOLIN HFA) 108 (90 Base) MCG/ACT inhaler INHALE 2 PUFFS INTO THE LUNGS EVERY 6 HOURS AS NEEDED FOR WHEEZING OR SHORTNESS OF BREATH   amLODipine (NORVASC) 5 MG tablet Take 1 tablet (5 mg total) by mouth daily.   buPROPion (WELLBUTRIN XL) 150 MG 24 hr tablet Take 1 tablet (150 mg total) by mouth every morning. 3  qam   diphenhydrAMINE (BENADRYL) 25 MG tablet Take 25 mg by mouth in the morning.   donepezil (ARICEPT) 23 MG TABS tablet Take 0.5 tablets (11.5 mg total) by mouth at bedtime.   fluticasone (FLONASE) 50 MCG/ACT nasal spray Place 2 sprays into both nostrils daily.   gabapentin (NEURONTIN) 100 MG capsule TAKE ONE CAPSULE TWICE A DAY   ipratropium (ATROVENT) 0.03 %  nasal spray PLACE 2 SPRAYS INTO BOTH NOSTRILS EVERY 12 HOURS   levothyroxine (SYNTHROID) 125 MCG tablet TAKE ONE TABLET AT BEDTIME   lisinopril (ZESTRIL) 5 MG tablet Take 1 tablet (5 mg total) by mouth daily.   melatonin 5 MG TABS Take 5 mg by mouth at bedtime.   memantine (NAMENDA TITRATION PAK) tablet pack 5 mg/day for =1 week; 5 mg twice daily for =1 week; 15 mg/day  given in 5 mg and 10 mg separated doses for =1 week; then 10 mg twice daily   metFORMIN (GLUCOPHAGE) 500 MG tablet TAKE ONE TABLET TWICE DAILY WITH A MEAL   propranolol (INDERAL) 80 MG tablet Take 0.5 tablets (40 mg total) by mouth 3 (three) times daily.   sodium chloride 1 g tablet TAKE ONE TABLET THREE TIMES DAILY   temazepam (RESTORIL) 30 MG capsule Take 1 capsule (30 mg total) by mouth at bedtime.   Tiotropium Bromide-Olodaterol (STIOLTO RESPIMAT) 2.5-2.5 MCG/ACT AERS Inhale 2 puffs into the lungs daily.   pantoprazole (PROTONIX) 40 MG tablet TAKE ONE TABLET DAILY (Patient not taking: Reported on 03/01/2021)   rosuvastatin (CRESTOR) 5 MG tablet TAKE ONE TABLET EACH DAY (Patient not taking: Reported on 03/01/2021)   No facility-administered encounter medications on file as of 03/01/2021.    Allergies (verified) Albuterol, Penicillins, Sulfa antibiotics, Codeine, Ecotrin [aspirin], and Zanaflex [tizanidine]   History: Past Medical History:  Diagnosis Date   Arthritis    OA- knees & back  neck   Chronic back pain    Cognitive retention disorder    Collapse of right lung    COPD (chronic obstructive pulmonary disease) (HCC)    Hx. Bronchitis occ, 01-25-11 somes issue now taking  Z-pak-started 01-24-11/Scar tissue  present  from previous lung collapse   Dementia with psychosis    Depression    Depression    Diabetes mellitus ([place]) 07/08/2020   Dyspnea    Dysrhythmia 2018   treated with Ablation    GERD (gastroesophageal reflux disease)    tx. TUMS   Glaucoma    Headache    History of kidney stones    surgically treated   Hyperlipemia    Hypertension    Hypothyroidism    tx. Levothyroxine   Major depression with psychotic features ([place])    Nephrolithiasis    Neuromuscular disorder ([place]) 01-25-11   Pain/nerve stimulator implanted -2 yrs ago.   Reflex, gag, absent    Reflex, gag, absent    Past Surgical History:  Procedure Laterality Date   A-FLUTTER ABLATION N/A 08/16/2016    Procedure: A-Flutter Ablation;  Surgeon: Evans Lance, MD;  Location: [facility];  Service: Cardiovascular;  Laterality: N/A;   ANTERIOR CERVICAL DECOMP/DISCECTOMY FUSION     3 levels, per family   APPENDECTOMY  01-25-11   CARPAL TUNNEL RELEASE  01-25-11   [facility].   CERVICAL SPINE SURGERY  01-25-11   2005-multiple levels   CHOLECYSTECTOMY  01-25-11   '07   COLONOSCOPY WITH PROPOFOL N/A 07/23/2012   Procedure: COLONOSCOPY WITH PROPOFOL;  Surgeon: Garlan Fair, MD;  Location: [facility];  Service: Endoscopy;  Laterality: N/A;   detatched retna Bilateral    done July & August- 2019   EYE SURGERY Bilateral 2019   repair of retina detachment- Dr. Zadie Rhine at surgical center    JOINT REPLACEMENT  01-25-11   6'03 [place]  01-25-11   '04-right, revised x2   no gag reflex     Pt  does not havea gag reflex following  siurgery to neck. Family unsure of date. Pt takes pill in apple sauce.   SPINAL CORD STIMULATOR BATTERY EXCHANGE N/A 01/12/2018   Procedure: Spinal cord stimulator battery replacement;  Surgeon: Clydell Hakim, MD;  Location: [facility];  Service: Neurosurgery;  Laterality: N/A;  left   SPINAL CORD STIMULATOR IMPLANT  2009 APPROX   DR. ELSNER   SPINAL CORD STIMULATOR REMOVAL N/A 06/04/2018   Procedure: Removal of spinal cord stimulator, thoracic paddle and generator;  Surgeon: Kristeen Miss, MD;  Location: [facility];  Service: Neurosurgery;  Laterality: N/A;   THORACIC SPINE SURGERY  01-25-11   6'02- then nerve stimulator implanted after   TOTAL HIP ARTHROPLASTY Right 07/29/2020   Procedure: RIGHT TOTAL HIP ARTHROPLASTY ANTERIOR APPROACH;  Surgeon: Marybelle Killings, MD;  Location: [facility];  Service: Orthopedics;  Laterality: Right;   TOTAL KNEE REVISION  02/01/2011   Procedure: TOTAL KNEE REVISION;  Surgeon: Mauri Pole;  Location: [facility];  Service: Orthopedics;  Laterality: Right;   TUBAL LIGATION     Family History  Problem Relation Age of Onset   Heart attack  Father    Emphysema Father    Aneurysm Mother        CEREBRAL   Emphysema Mother    Dementia Mother    Diabetes Son    Hypertension Son    Hypertension Son    Cancer Sister        Widely Metastatic   Asthma Son        x2   Breast cancer Paternal Aunt    Rheum arthritis Maternal Aunt    Social History   Socioeconomic History   Marital status: Married    Spouse name: Not on file   Number of children: 2   Years of education: Not on file   Highest education level: Not on file  Occupational History   Occupation: disabled  Tobacco Use   Smoking status: Former    Packs/day: 0.75    Years: 50.00    Pack years: 37.50    Types: Cigarettes    Start date: 09/13/1961    Quit date: 05/2016    Years since quitting: 4.7   Smokeless tobacco: Never   Tobacco comments:    Decreased Down to 2/3 a Day if Thinking Abouit It  Vaping Use   Vaping Use: Never used  Substance and Sexual Activity   Alcohol use: No    Alcohol/week: 0.0 standard drinks    Comment: none in 10 years   Drug use: No   Sexual activity: Not Currently  Other Topics Concern   Not on file  Social History Narrative   She is from [place]. She has always lived in [place]. She has traveled to [place], [place], [place], [place], & [place]. 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 Prior exposure to parakeets with last exposure remote in her current home. Previously had mold in her home that was in her bathroom & fixed. Prior exposure to hot tubs but none recently. Pt lives at home with [name], husband.  Has 2 childrean, 12th grade education.     Social Determinants of Health   Financial Resource Strain: Low Risk    Difficulty of Paying Living Expenses: Not hard at all  Food Insecurity: No Food Insecurity   Worried About Charity fundraiser in the Last  Year: Never true   [place] in the Last Year: Never true  Transportation  Needs: No Transportation Needs   Lack of Transportation (Medical): No   Lack of Transportation (Non-Medical): No  Physical Activity: Inactive   Days of Exercise per Week: 0 days   Minutes of Exercise per Session: 0 min  Stress: Stress Concern Present   Feeling of Stress : To some extent  Social Connections: Not on file    Tobacco Counseling Counseling given: Not Answered Tobacco comments: Decreased Down to 2/3 a Day if Thinking Abouit It   Clinical Intake:  Pre-visit preparation completed: Yes  Pain : 0-10 Pain Score: 2  Pain Type: Chronic pain Pain Location: Knee Pain Orientation: Right Pain Descriptors / Indicators: Numbness Pain Onset: More than a month ago Pain Frequency: Constant     Nutritional Status: BMI 25 -29 Overweight Nutritional Risks: None Diabetes: Yes  How often do you need to have someone help you when you read instructions, pamphlets, or other written materials from your doctor or pharmacy?: 1 - Never  Diabetic? Yes Nutrition Risk Assessment:  Has the patient had any N/V/D within the last 2 months?  No  Does the patient have any non-healing wounds?  No  Has the patient had any unintentional weight loss or weight gain?  No   Diabetes:  Is the patient diabetic?  Yes  If diabetic, was a CBG obtained today?  No  Did the patient bring in their glucometer from home?  No  How often do you monitor your CBG's? Does not.   Financial Strains and Diabetes Management:  Are you having any financial strains with the device, your supplies or your medication? No .  Does the patient want to be seen by Chronic Care Management for management of their diabetes?  No  Would the patient like to be referred to a Nutritionist or for Diabetic Management?  No   Diabetic Exams:  Diabetic Eye Exam: Overdue for diabetic eye exam. Pt has been advised about the importance in completing this exam. Patient advised to call and schedule an eye exam. Diabetic Foot Exam:  Overdue, Pt has been advised about the importance in completing this exam. Pt is scheduled for diabetic foot exam on next appointment.   Interpreter Needed?: No  Information entered by :: NAllen LPN   Activities of Daily Living In your present state of health, do you have any difficulty performing the following activities: 03/01/2021 07/29/2020  Hearing? N N  Vision? N N  Difficulty concentrating or making decisions? Y N  Walking or climbing stairs? Y Y  Dressing or bathing? N N  Doing errands, shopping? Y N  Preparing Food and eating ? N -  Using the Toilet? N -  In the past six months, have you accidently leaked urine? N -  Do you have problems with loss of bowel control? N -  Managing your Medications? Y -  Managing your Finances? Y -  Housekeeping or managing your Housekeeping? N -  Some recent data might be hidden    Patient Care Team: Billie Ruddy, MD as PCP - General (Family Medicine) Evans Lance, MD as PCP - Cardiology (Cardiology) Paralee Cancel, MD as Consulting Physician (Orthopedic Surgery) Darlin Coco, MD as Consulting Physician (Cardiology) Garlan Fair, MD as Consulting Physician (Gastroenterology) Nicholaus Bloom, MD (Anesthesiology)  Indicate any recent Medical Services you may have received from other than [facility] providers in the past year (date may be approximate).  Assessment:   This is a routine wellness examination for Jayel.  Hearing/Vision screen Vision Screening - Comments:: No regular eye exams,  Dietary issues and exercise activities discussed: Current Exercise Habits: The patient does not participate in regular exercise at present   Goals Addressed             This Visit's Progress    Patient Stated       03/01/2021, wants to be able to walk and breathe better       Depression Screen PHQ 2/9 Scores 03/01/2021 02/27/2020 07/26/2016 07/15/2016 04/28/2015 10/03/2014 05/21/2014  PHQ - 2 Score 1 0 0 0 0 2 0  PHQ- 9 Score -  0 - - - 6 -    Fall Risk Fall Risk  03/01/2021 02/27/2020 07/26/2016 07/15/2016 04/28/2015  Falls in the past year? 0 0 Yes - No  Number falls in past yr: - 0 2 or more - -  Injury with Fall? - 0 Yes - -  Risk Factor Category  - - High Fall Risk - -  Risk for fall due to : Impaired balance/gait;Impaired mobility;Medication side effect History of fall(s);Impaired balance/gait History of fall(s);Impaired balance/gait;Impaired mobility;Impaired vision Impaired balance/gait;Impaired mobility;Impaired vision;Medication side effect -  Follow up Falls evaluation completed;Education provided;Falls prevention discussed Falls evaluation completed;Falls prevention discussed Falls prevention discussed - -    FALL RISK PREVENTION PERTAINING TO THE HOME:  Any stairs in or around the home? No  If so, are there any without handrails? N/a Home free of loose throw rugs in walkways, pet beds, electrical cords, etc? Yes  Adequate lighting in your home to reduce risk of falls? Yes   ASSISTIVE DEVICES UTILIZED TO PREVENT FALLS:  Life alert? No  Use of a cane, walker or w/c? Yes  Grab bars in the bathroom? No  Shower chair or bench in shower? Yes  Elevated toilet seat or a handicapped toilet? Yes   TIMED UP AND GO:  Was the test performed? No .      Cognitive Function: MMSE - Mini Mental State Exam 08/22/2016 07/01/2016  Orientation to time 0 1  Orientation to Place 3 3  Registration 3 3  Attention/ Calculation 1 3  Recall 2 1  Language- name 2 objects 2 2  Language- repeat 1 1  Language- follow 3 step command 3 3  Language- read & follow direction 1 1  Write a sentence 1 1  Copy design 1 1  Total score 18 20     6CIT Screen 02/27/2020  What Year? 0 points  What month? 0 points  What time? 0 points  Count back from 20 0 points  Months in reverse 4 points  Repeat phrase 2 points  Total Score 6    Immunizations Immunization History  Administered Date(s) Administered   Fluad Quad(high Dose  65+) 12/17/2018, 12/23/2019, 12/24/2020   Influenza, High Dose Seasonal PF 04/13/2017, 12/05/2017   PFIZER(Purple Top)SARS-COV-2 Vaccination 04/26/2019, 05/21/2019, 01/18/2020   Pneumococcal Conjugate-13 10/03/2014   Pneumococcal Polysaccharide-23 10/18/2011   Td 03/21/2010    TDAP status: Due, Education has been provided regarding the importance of this vaccine. Advised may receive this vaccine at local pharmacy or Health Dept. Aware to provide a copy of the vaccination record if obtained from local pharmacy or Health Dept. Verbalized acceptance and understanding.  Flu Vaccine status: Up to date  Pneumococcal vaccine status: Up to date  Covid-19 vaccine status: Completed vaccines  Qualifies for Shingles Vaccine? Yes   Zostavax completed No  Shingrix Completed?: No.    Education has been provided regarding the importance of this vaccine. Patient has been advised to call insurance company to determine out of pocket expense if they have not yet received this vaccine. Advised may also receive vaccine at local pharmacy or Health Dept. Verbalized acceptance and understanding.  Screening Tests Health Maintenance  Topic Date Due   FOOT EXAM  Never done   OPHTHALMOLOGY EXAM  Never done   Zoster Vaccines- Shingrix (1 of 2) Never done   DEXA SCAN  Never done   COVID-19 Vaccine (4 - Booster for Pfizer series) 03/14/2020   TETANUS/TDAP  03/21/2020   HEMOGLOBIN A1C  01/07/2021   Pneumonia Vaccine 37+ Years old  Completed   INFLUENZA VACCINE  Completed   Hepatitis C Screening  Completed   HPV VACCINES  Aged Out   COLONOSCOPY (Pts 45-68yrs Insurance coverage will need to be confirmed)  Discontinued    Health Maintenance  Health Maintenance Due  Topic Date Due   FOOT EXAM  Never done   OPHTHALMOLOGY EXAM  Never done   Zoster Vaccines- Shingrix (1 of 2) Never done   DEXA SCAN  Never done   COVID-19 Vaccine (4 - Booster for Pfizer series) 03/14/2020   TETANUS/TDAP  03/21/2020    HEMOGLOBIN A1C  01/07/2021    Colorectal cancer screening: No longer required.   Mammogram status: No longer required due to age.  Bone Density status: decline  Lung Cancer Screening: (Low Dose CT Chest recommended if Age 52-80 years, 30 pack-year currently smoking OR have quit w/in 15years.) does not qualify.   Lung Cancer Screening Referral: no  Additional Screening:  Hepatitis C Screening: does qualify; Completed 03/02/2020  Vision Screening: Recommended annual ophthalmology exams for early detection of glaucoma and other disorders of the eye. Is the patient up to date with their annual eye exam?  No  Who is the provider or what is the name of the office in which the patient attends annual eye exams? none If pt is not established with a provider, would they like to be referred to a provider to establish care? No .   Dental Screening: Recommended annual dental exams for proper oral hygiene  Community Resource Referral / Chronic Care Management: CRR required this visit?  No   CCM required this visit?  No      Plan:     I have personally reviewed and noted the following in the patient's chart:   Medical and social history Use of alcohol, tobacco or illicit drugs  Current medications and supplements including opioid prescriptions.  Functional ability and status Nutritional status Physical activity Advanced directives List of other physicians Hospitalizations, surgeries, and ER visits in previous 12 months Vitals Screenings to include cognitive, depression, and falls Referrals and appointments  In addition, I have reviewed and discussed with patient certain preventive protocols, quality metrics, and best practice recommendations. A written personalized care plan for preventive services as well as general preventive health recommendations were provided to patient.     Kellie Simmering, LPN   89/21/1943   Nurse Notes: 6 CIT not administered. Patient has diagnosis of  dementia.

## 2021-03-01 NOTE — Patient Instructions (Signed)
Destiny Hughes , Thank you for taking time to come for your Medicare Wellness Visit. I appreciate your ongoing commitment to your health goals. Please review the following plan we discussed and let me know if I can assist you in the future.   Screening recommendations/referrals: Colonoscopy: not required Mammogram: not required Bone Density: decline Recommended yearly ophthalmology/optometry visit for glaucoma screening and checkup Recommended yearly dental visit for hygiene and checkup  Vaccinations: Influenza vaccine: completed 12/24/2020 Pneumococcal vaccine: completed 10/03/2014 Tdap vaccine: due Shingles vaccine: discussed   Covid-19: 01/18/2020, 05/21/2019, 04/26/2019  Advanced directives: Please bring a copy of your POA (Power of Attorney) and/or Living Will to your next appointment.   Conditions/risks identified: none  Next appointment: Follow up in one year for your annual wellness visit    Preventive Care 65 Years and Older, Female Preventive care refers to lifestyle choices and visits with your health care provider that can promote health and wellness. What does preventive care include? A yearly physical exam. This is also called an annual well check. Dental exams once or twice a year. Routine eye exams. Ask your health care provider how often you should have your eyes checked. Personal lifestyle choices, including: Daily care of your teeth and gums. Regular physical activity. Eating a healthy diet. Avoiding tobacco and drug use. Limiting alcohol use. Practicing safe sex. Taking low-dose aspirin every day. Taking vitamin and mineral supplements as recommended by your health care provider. What happens during an annual well check? The services and screenings done by your health care provider during your annual well check will depend on your age, overall health, lifestyle risk factors, and family history of disease. Counseling  Your health care provider may ask you  questions about your: Alcohol use. Tobacco use. Drug use. Emotional well-being. Home and relationship well-being. Sexual activity. Eating habits. History of falls. Memory and ability to understand (cognition). Work and work Statistician. Reproductive health. Screening  You may have the following tests or measurements: Height, weight, and BMI. Blood pressure. Lipid and cholesterol levels. These may be checked every 5 years, or more frequently if you are over 73 years old. Skin check. Lung cancer screening. You may have this screening every year starting at age 55 if you have a 30-pack-year history of smoking and currently smoke or have quit within the past 15 years. Fecal occult blood test (FOBT) of the stool. You may have this test every year starting at age 58. Flexible sigmoidoscopy or colonoscopy. You may have a sigmoidoscopy every 5 years or a colonoscopy every 10 years starting at age 92. Hepatitis C blood test. Hepatitis B blood test. Sexually transmitted disease (STD) testing. Diabetes screening. This is done by checking your blood sugar (glucose) after you have not eaten for a while (fasting). You may have this done every 1-3 years. Bone density scan. This is done to screen for osteoporosis. You may have this done starting at age 47. Mammogram. This may be done every 1-2 years. Talk to your health care provider about how often you should have regular mammograms. Talk with your health care provider about your test results, treatment options, and if necessary, the need for more tests. Vaccines  Your health care provider may recommend certain vaccines, such as: Influenza vaccine. This is recommended every year. Tetanus, diphtheria, and acellular pertussis (Tdap, Td) vaccine. You may need a Td booster every 10 years. Zoster vaccine. You may need this after age 58. Pneumococcal 13-valent conjugate (PCV13) vaccine. One dose is recommended after age  65. Pneumococcal polysaccharide  (PPSV23) vaccine. One dose is recommended after age 34. Talk to your health care provider about which screenings and vaccines you need and how often you need them. This information is not intended to replace advice given to you by your health care provider. Make sure you discuss any questions you have with your health care provider. Document Released: 04/03/2015 Document Revised: 11/25/2015 Document Reviewed: 01/06/2015 Elsevier Interactive Patient Education  2017 [place] Prevention in the Home Falls can cause injuries. They can happen to people of all ages. There are many things you can do to make your home safe and to help prevent falls. What can I do on the outside of my home? Regularly fix the edges of walkways and driveways and fix any cracks. Remove anything that might make you trip as you walk through a door, such as a raised step or threshold. Trim any bushes or trees on the path to your home. Use bright outdoor lighting. Clear any walking paths of anything that might make someone trip, such as rocks or tools. Regularly check to see if handrails are loose or broken. Make sure that both sides of any steps have handrails. Any raised decks and porches should have guardrails on the edges. Have any leaves, snow, or ice cleared regularly. Use sand or salt on walking paths during winter. Clean up any spills in your garage right away. This includes oil or grease spills. What can I do in the bathroom? Use night lights. Install grab bars by the toilet and in the tub and shower. Do not use towel bars as grab bars. Use non-skid mats or decals in the tub or shower. If you need to sit down in the shower, use a plastic, non-slip stool. Keep the floor dry. Clean up any water that spills on the floor as soon as it happens. Remove soap buildup in the tub or shower regularly. Attach bath mats securely with double-sided non-slip rug tape. Do not have throw rugs and other things on the  floor that can make you trip. What can I do in the bedroom? Use night lights. Make sure that you have a light by your bed that is easy to reach. Do not use any sheets or blankets that are too big for your bed. They should not hang down onto the floor. Have a firm chair that has side arms. You can use this for support while you get dressed. Do not have throw rugs and other things on the floor that can make you trip. What can I do in the kitchen? Clean up any spills right away. Avoid walking on wet floors. Keep items that you use a lot in easy-to-reach places. If you need to reach something above you, use a strong step stool that has a grab bar. Keep electrical cords out of the way. Do not use floor polish or wax that makes floors slippery. If you must use wax, use non-skid floor wax. Do not have throw rugs and other things on the floor that can make you trip. What can I do with my stairs? Do not leave any items on the stairs. Make sure that there are handrails on both sides of the stairs and use them. Fix handrails that are broken or loose. Make sure that handrails are as long as the stairways. Check any carpeting to make sure that it is firmly attached to the stairs. Fix any carpet that is loose or worn. Avoid having throw rugs at  the top or bottom of the stairs. If you do have throw rugs, attach them to the floor with carpet tape. Make sure that you have a light switch at the top of the stairs and the bottom of the stairs. If you do not have them, ask someone to add them for you. What else can I do to help prevent falls? Wear shoes that: Do not have high heels. Have rubber bottoms. Are comfortable and fit you well. Are closed at the toe. Do not wear sandals. If you use a stepladder: Make sure that it is fully opened. Do not climb a closed stepladder. Make sure that both sides of the stepladder are locked into place. Ask someone to hold it for you, if possible. Clearly mark and make  sure that you can see: Any grab bars or handrails. First and last steps. Where the edge of each step is. Use tools that help you move around (mobility aids) if they are needed. These include: Canes. Walkers. Scooters. Crutches. Turn on the lights when you go into a dark area. Replace any light bulbs as soon as they burn out. Set up your furniture so you have a clear path. Avoid moving your furniture around. If any of your floors are uneven, fix them. If there are any pets around you, be aware of where they are. Review your medicines with your doctor. Some medicines can make you feel dizzy. This can increase your chance of falling. Ask your doctor what other things that you can do to help prevent falls. This information is not intended to replace advice given to you by your health care provider. Make sure you discuss any questions you have with your health care provider. Document Released: 01/01/2009 Document Revised: 08/13/2015 Document Reviewed: 04/11/2014 Elsevier Interactive Patient Education  2017 [employer].

## 2021-03-04 ENCOUNTER — Telehealth: Payer: Self-pay | Admitting: Family Medicine

## 2021-03-04 NOTE — Chronic Care Management (AMB) (Signed)
°  Chronic Care Management   Note  03/04/2021 Name: Destiny Hughes MRN: [id number] DOB: July 16, 1945  Destiny Hughes is a 75 y.o. year old female who is a primary care patient of Billie Ruddy, MD. I reached out to Lyndel Pleasure by phone today in response to a referral sent by Destiny Hughes's PCP, Billie Ruddy, MD.   Destiny Hughes was given information about Chronic Care Management services today including:  CCM service includes personalized support from designated clinical staff supervised by her physician, including individualized plan of care and coordination with other care providers 24/7 contact phone numbers for assistance for urgent and routine care needs. Service will only be billed when office clinical staff spend 20 minutes or more in a month to coordinate care. Only one practitioner may furnish and bill the service in a calendar month. The patient may stop CCM services at any time (effective at the end of the month) by phone call to the office staff.   Patient agreed to services and verbal consent obtained.   Follow up plan:   Destiny Hughes

## 2021-03-05 ENCOUNTER — Ambulatory Visit (HOSPITAL_BASED_OUTPATIENT_CLINIC_OR_DEPARTMENT_OTHER): Payer: Medicare Other | Admitting: Psychiatry

## 2021-03-05 ENCOUNTER — Other Ambulatory Visit: Payer: Self-pay

## 2021-03-05 DIAGNOSIS — F325 Major depressive disorder, single episode, in full remission: Secondary | ICD-10-CM | POA: Diagnosis not present

## 2021-03-05 MED ORDER — DONEPEZIL HCL 23 MG PO TABS: ORAL_TABLET | ORAL | 5 refills | Status: DC

## 2021-03-05 MED ORDER — TEMAZEPAM 30 MG PO CAPS: 30.0000 mg | ORAL_CAPSULE | Freq: Every day | ORAL | 5 refills | Status: DC

## 2021-03-05 MED ORDER — BUPROPION HCL ER (XL) 150 MG PO TB24: 150.0000 mg | ORAL_TABLET | ORAL | 6 refills | Status: DC

## 2021-03-05 NOTE — Progress Notes (Signed)
Psychiatric Initial Adult Assessment   Patient Identification: Destiny Hughes MRN:  [id number] Date of Evaluation:  03/05/2021 Referral Source: [facility] Chief Complaint: Major depression remission  Today the patient is doing better.  Her mood is better.  She has completed some breathing from the death of her son who died in [place] suddenly.  Her mood is definitely better.  She is sleeping better now that she is taking Restoril.  Her emotions are better since the increase of Wellbutrin to 450 mg.  There is some confusion over her memory medications.  For some reason she is only taking half the dose of Aricept.  Today we will clarify that she should take the full dose of 28 mg every night.  There is some confusion also about her Namenda.  For now we will discontinue it and really consider it on her next visit.  The patient is sleeping and eating well.  She is got good energy.  She is not psychotic.  She drinks no alcohol uses no drugs.  She is seen with her son today.  He says she is actually doing quite well.  They are looking forward to Christmas.  The patient's husband [name] is not doing as well as he is lost a lot of weight and is not here today for this meeting.  Overall though the patient is functioning much better.  She plays computer games.  She knows the year and the month.  She actually does all her basic ADLs.  (Hypo) Manic Symptoms:   Anxiety Symptoms:   Psychotic Symptoms:   PTSD Symptoms:   Past Psychiatric History: none  Previous Psychotropic Medications: Yes   Substance Abuse History in the last 12 months:  No.  Consequences of Substance Abuse:   Past Medical History:  Past Medical History:  Diagnosis Date   Arthritis    OA- knees & back  neck   Chronic back pain    Cognitive retention disorder    Collapse of right lung    COPD (chronic obstructive pulmonary disease) (HCC)    Hx. Bronchitis occ, 01-25-11 somes issue now taking  Z-pak-started  01-24-11/Scar tissue  present  from previous lung collapse   Dementia with psychosis    Depression    Depression    Diabetes mellitus ([place]) 07/08/2020   Dyspnea    Dysrhythmia 2018   treated with Ablation    GERD (gastroesophageal reflux disease)    tx. TUMS   Glaucoma    Headache    History of kidney stones    surgically treated   Hyperlipemia    Hypertension    Hypothyroidism    tx. Levothyroxine   Major depression with psychotic features ([place])    Nephrolithiasis    Neuromuscular disorder ([place]) 01-25-11   Pain/nerve stimulator implanted -2 yrs ago.   Reflex, gag, absent    Reflex, gag, absent     Past Surgical History:  Procedure Laterality Date   A-FLUTTER ABLATION N/A 08/16/2016   Procedure: A-Flutter Ablation;  Surgeon: Evans Lance, MD;  Location: [facility];  Service: Cardiovascular;  Laterality: N/A;   ANTERIOR CERVICAL DECOMP/DISCECTOMY FUSION     3 levels, per family   APPENDECTOMY  01-25-11   CARPAL TUNNEL RELEASE  01-25-11   [facility].   CERVICAL SPINE SURGERY  01-25-11   2005-multiple levels   CHOLECYSTECTOMY  01-25-11   '07   COLONOSCOPY WITH PROPOFOL N/A 07/23/2012   Procedure: COLONOSCOPY WITH PROPOFOL;  Surgeon: Ursula Alert  Wynetta Emery, MD;  Location: [facility];  Service: Endoscopy;  Laterality: N/A;   detatched retna Bilateral    done July & August- 2019   EYE SURGERY Bilateral 2019   repair of retina detachment- Dr. Zadie Rhine at surgical center    JOINT REPLACEMENT  01-25-11   6'03 [place]  01-25-11   '04-right, revised x2   no gag reflex     Pt does not havea gag reflex following  siurgery to neck. Family unsure of date. Pt takes pill in apple sauce.   SPINAL CORD STIMULATOR BATTERY EXCHANGE N/A 01/12/2018   Procedure: Spinal cord stimulator battery replacement;  Surgeon: Clydell Hakim, MD;  Location: [facility];  Service: Neurosurgery;  Laterality: N/A;  left   SPINAL CORD STIMULATOR IMPLANT  2009 APPROX   DR. ELSNER   SPINAL CORD  STIMULATOR REMOVAL N/A 06/04/2018   Procedure: Removal of spinal cord stimulator, thoracic paddle and generator;  Surgeon: Kristeen Miss, MD;  Location: [facility];  Service: Neurosurgery;  Laterality: N/A;   THORACIC SPINE SURGERY  01-25-11   6'02- then nerve stimulator implanted after   TOTAL HIP ARTHROPLASTY Right 07/29/2020   Procedure: RIGHT TOTAL HIP ARTHROPLASTY ANTERIOR APPROACH;  Surgeon: Marybelle Killings, MD;  Location: [facility];  Service: Orthopedics;  Laterality: Right;   TOTAL KNEE REVISION  02/01/2011   Procedure: TOTAL KNEE REVISION;  Surgeon: Mauri Pole;  Location: [facility];  Service: Orthopedics;  Laterality: Right;   TUBAL LIGATION      Family Psychiatric History:   Family History:  Family History  Problem Relation Age of Onset   Heart attack Father    Emphysema Father    Aneurysm Mother        CEREBRAL   Emphysema Mother    Dementia Mother    Diabetes Son    Hypertension Son    Hypertension Son    Cancer Sister        Widely Metastatic   Asthma Son        x2   Breast cancer Paternal Aunt    Rheum arthritis Maternal Aunt     Social History:   Social History   Socioeconomic History   Marital status: Married    Spouse name: Not on file   Number of children: 2   Years of education: Not on file   Highest education level: Not on file  Occupational History   Occupation: disabled  Tobacco Use   Smoking status: Former    Packs/day: 0.75    Years: 50.00    Pack years: 37.50    Types: Cigarettes    Start date: 09/13/1961    Quit date: 05/2016    Years since quitting: 4.7   Smokeless tobacco: Never   Tobacco comments:    Decreased Down to 2/3 a Day if [place] It  Vaping Use   Vaping Use: Never used  Substance and Sexual Activity   Alcohol use: No    Alcohol/week: 0.0 standard drinks    Comment: none in 10 years   Drug use: No   Sexual activity: Not Currently  Other Topics Concern   Not on file  Social History Narrative   She is from [place]. She has  always lived in [place]. She has traveled to [place], [place], [place], [place], & [place]. 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 Prior exposure  to parakeets with last exposure remote in her current home. Previously had mold in her home that was in her bathroom & fixed. Prior exposure to hot tubs but none recently. Pt lives at home with [name], husband.  Has 2 childrean, 12th grade education.     Social Determinants of Health   Financial Resource Strain: Low Risk    Difficulty of Paying Living Expenses: Not hard at all  Food Insecurity: No Food Insecurity   Worried About Charity fundraiser in the Last Year: Never true   [place] in the Last Year: Never true  Transportation Needs: No Transportation Needs   Lack of Transportation (Medical): No   Lack of Transportation (Non-Medical): No  Physical Activity: Inactive   Days of Exercise per Week: 0 days   Minutes of Exercise per Session: 0 min  Stress: Stress Concern Present   Feeling of Stress : To some extent  Social Connections: Not on file    Additional Social History:   Allergies:   Allergies  Allergen Reactions   Albuterol Shortness Of Breath and Other (See Comments)   Penicillins Anaphylaxis, Swelling and Other (See Comments)    Has patient had a PCN reaction causing immediate rash, facial/tongue/throat swelling, SOB or lightheadedness with hypotension: Yes Has patient had a PCN reaction causing severe rash involving mucus membranes or skin necrosis: Rash Has patient had a PCN reaction that required hospitalization: No Has patient had a PCN reaction occurring within the last 10 years: No If all of the above answers are "NO", then may proceed with Cephalosporin use.   Sulfa Antibiotics Swelling and Other (See Comments)    Throat swells, but no shortness of breath noted   Codeine Nausea And Vomiting   Ecotrin [Aspirin] Nausea And  Vomiting   Zanaflex [Tizanidine] Other (See Comments)    Possible confusion    Metabolic Disorder Labs: Lab Results  Component Value Date   HGBA1C 7.5 (A) 07/08/2020   MPG 217 03/02/2020   MPG 137 (H) 04/28/2015   No results found for: PROLACTIN Lab Results  Component Value Date   CHOL 338 (H) 03/02/2020   TRIG 397 (H) 03/02/2020   HDL 52 03/02/2020   CHOLHDL 6.5 (H) 03/02/2020   VLDL 52.8 (H) 02/21/2019   LDLCALC 219 (H) 03/02/2020   LDLCALC 114 04/28/2015     Current Medications: Current Outpatient Medications  Medication Sig Dispense Refill   acetaminophen (TYLENOL) 500 MG tablet Take 1,000 mg by mouth every 6 (six) hours as needed for moderate pain.     albuterol (PROVENTIL) (2.5 MG/3ML) 0.083% nebulizer solution Take 3 mLs (2.5 mg total) by nebulization every 6 (six) hours as needed for wheezing or shortness of breath. 75 mL 12   albuterol (VENTOLIN HFA) 108 (90 Base) MCG/ACT inhaler INHALE 2 PUFFS INTO THE LUNGS EVERY 6 HOURS AS NEEDED FOR WHEEZING OR SHORTNESS OF BREATH 8.5 g 6   amLODipine (NORVASC) 5 MG tablet Take 1 tablet (5 mg total) by mouth daily. 90 tablet 3   buPROPion (WELLBUTRIN XL) 150 MG 24 hr tablet Take 1 tablet (150 mg total) by mouth every morning. 3  qam 90 tablet 6   diphenhydrAMINE (BENADRYL) 25 MG tablet Take 25 mg by mouth in the morning.     donepezil (ARICEPT) 23 MG TABS tablet 1  qhs 30 tablet 5   fluticasone (FLONASE) 50 MCG/ACT nasal spray Place 2 sprays into both nostrils daily. 16 g 2   gabapentin (NEURONTIN) 100 MG  capsule TAKE ONE CAPSULE TWICE A DAY 180 capsule 3   ipratropium (ATROVENT) 0.03 % nasal spray PLACE 2 SPRAYS INTO BOTH NOSTRILS EVERY 12 HOURS 30 mL 2   levothyroxine (SYNTHROID) 125 MCG tablet TAKE ONE TABLET AT BEDTIME 90 tablet 0   lisinopril (ZESTRIL) 5 MG tablet Take 1 tablet (5 mg total) by mouth daily. 90 tablet 3   melatonin 5 MG TABS Take 5 mg by mouth at bedtime.     metFORMIN (GLUCOPHAGE) 500 MG tablet TAKE ONE  TABLET TWICE DAILY WITH A MEAL 90 tablet 1   pantoprazole (PROTONIX) 40 MG tablet TAKE ONE TABLET DAILY (Patient not taking: Reported on 03/01/2021) 90 tablet 0   propranolol (INDERAL) 80 MG tablet Take 0.5 tablets (40 mg total) by mouth 3 (three) times daily. 135 tablet 3   rosuvastatin (CRESTOR) 5 MG tablet TAKE ONE TABLET EACH DAY (Patient not taking: Reported on 03/01/2021) 30 tablet 3   sodium chloride 1 g tablet TAKE ONE TABLET THREE TIMES DAILY 180 tablet 1   temazepam (RESTORIL) 30 MG capsule Take 1 capsule (30 mg total) by mouth at bedtime. 30 capsule 5   Tiotropium Bromide-Olodaterol (STIOLTO RESPIMAT) 2.5-2.5 MCG/ACT AERS Inhale 2 puffs into the lungs daily. 4 g 3   No current facility-administered medications for this visit.    Neurologic: Headache: No Seizure: No Paresthesias:No  Musculoskeletal: Strength & Muscle Tone: decreased Gait & Station: unsteady Patient leans: N/A  Psychiatric Specialty Exam: ROS  There were no vitals taken for this visit.There is no height or weight on file to calculate BMI.  General Appearance: Casual  Eye Contact:  Good  Speech:  Clear and Coherent  Volume:  Normal  Mood:  NA  Affect:  depressed  Thought Process:  Goal Directed  Orientation:  Full (Time, Place, and Person)  Thought Content:  WDL  Suicidal Thoughts:  No  Homicidal Thoughts:  No  Memory:  Immediate;   Poor  Judgement:  Fair  Insight:  Lacking  Psychomotor Activity:  Normal  Concentration:    Recall:  Poor  Fund of Knowledge:Poor  Language: Fair  Akathisia:  No  Handed:  Right  AIMS (if indicated):   Assets:  Communication Skills  ADL's:  Impaired  Cognition: Impaired,  Moderate  Sleep:      Treatment Plan Summary:  This patient's first problem is that major depression.  She will continue taking Wellbutrin 450 mg.  Her second problem is that of insomnia.  She will continue taking Restoril 15 mg.  Her third problem is that of Alzheimer's dementia.  She will  continue taking Aricept but for now we will hold off on her memantine.  This patient she will return to see me in 3 months.

## 2021-03-08 ENCOUNTER — Other Ambulatory Visit: Payer: Self-pay | Admitting: Family Medicine

## 2021-03-08 ENCOUNTER — Ambulatory Visit (INDEPENDENT_AMBULATORY_CARE_PROVIDER_SITE_OTHER): Payer: Medicare Other | Admitting: Family Medicine

## 2021-03-08 VITALS — BP 142/76 | HR 64 | Temp 97.6°F

## 2021-03-08 DIAGNOSIS — G8929 Other chronic pain: Secondary | ICD-10-CM

## 2021-03-08 DIAGNOSIS — T148XXA Other injury of unspecified body region, initial encounter: Secondary | ICD-10-CM

## 2021-03-08 DIAGNOSIS — I10 Essential (primary) hypertension: Secondary | ICD-10-CM | POA: Diagnosis not present

## 2021-03-08 DIAGNOSIS — M25561 Pain in right knee: Secondary | ICD-10-CM

## 2021-03-08 DIAGNOSIS — R200 Anesthesia of skin: Secondary | ICD-10-CM | POA: Diagnosis not present

## 2021-03-08 DIAGNOSIS — E782 Mixed hyperlipidemia: Secondary | ICD-10-CM

## 2021-03-08 MED ORDER — CYCLOBENZAPRINE HCL 5 MG PO TABS: 2.5000 mg | ORAL_TABLET | Freq: Every day | ORAL | 0 refills | Status: DC | PRN

## 2021-03-08 NOTE — Progress Notes (Signed)
Subjective:    Patient ID: Destiny Hughes, female    DOB: March 03, 1946, 75 y.o.   MRN: [id number]  Chief Complaint  Patient presents with   Knee Pain    Right knee and hip pain. Had hip surgery a few months ago  Pt accompanied by her son.  HPI Patient is a 75 year old female with pmh sig for diastolic CHF, a flutter s/p TEE, chronic knee pain, h/o R THR, R TKR, HTN, COPD, GERD, IBS, hypothyroidism, dementia, neuromuscular d/o s/p pain/nerve stimulator, OA, HLD, h/o depression, tobacco use who was seen today for ongoing concerns and f/u.  Pt with R knee pain and edema s/p R THR 07/29/20.   States L knee is also painful, but R is worse.  Pain in RLE goes up into medial thigh.  Seen by Ortho, advised R THR was fine.  OTC medications were not helpful.  Patient with large area of ecchymosis on right arm after her dryer kept spinning when she opened the door.  Incident happened over a wk ago.     Pt and her son state they are caring for her husband who is dealing with ongoing health issues causing him to be mostly bedridden.  Past Medical History:  Diagnosis Date   Arthritis    OA- knees & back  neck   Chronic back pain    Cognitive retention disorder    Collapse of right lung    COPD (chronic obstructive pulmonary disease) (HCC)    Hx. Bronchitis occ, 01-25-11 somes issue now taking  Z-pak-started 01-24-11/Scar tissue  present  from previous lung collapse   Dementia with psychosis    Depression    Depression    Diabetes mellitus ([place]) 07/08/2020   Dyspnea    Dysrhythmia 2018   treated with Ablation    GERD (gastroesophageal reflux disease)    tx. TUMS   Glaucoma    Headache    History of kidney stones    surgically treated   Hyperlipemia    Hypertension    Hypothyroidism    tx. Levothyroxine   Major depression with psychotic features ([place])    Nephrolithiasis    Neuromuscular disorder ([place]) 01-25-11   Pain/nerve stimulator implanted -2 yrs ago.   Reflex, gag, absent    Reflex,  gag, absent     Allergies  Allergen Reactions   Albuterol Shortness Of Breath and Other (See Comments)   Penicillins Anaphylaxis, Swelling and Other (See Comments)    Has patient had a PCN reaction causing immediate rash, facial/tongue/throat swelling, SOB or lightheadedness with hypotension: Yes Has patient had a PCN reaction causing severe rash involving mucus membranes or skin necrosis: Rash Has patient had a PCN reaction that required hospitalization: No Has patient had a PCN reaction occurring within the last 10 years: No If all of the above answers are "NO", then may proceed with Cephalosporin use.   Sulfa Antibiotics Swelling and Other (See Comments)    Throat swells, but no shortness of breath noted   Codeine Nausea And Vomiting   Ecotrin [Aspirin] Nausea And Vomiting   Zanaflex [Tizanidine] Other (See Comments)    Possible confusion    ROS General: Denies fever, chills, night sweats, changes in weight, changes in appetite +h/o dementia. HEENT: Denies headaches, ear pain, changes in vision, rhinorrhea, sore throat CV: Denies CP, palpitations, SOB, orthopnea Pulm: Denies SOB, cough, wheezing GI: Denies abdominal pain, nausea, vomiting, diarrhea, constipation GU: Denies dysuria, hematuria, frequency, vaginal discharge Msk: Denies muscle cramps   +  R thigh and knee pain. Neuro: Denies weakness, numbness, tingling Skin: Denies rashes, bruising + ecchymosis of R forearm Psych: Denies depression, anxiety, hallucinations    Objective:    Blood pressure (!) 142/76, pulse 64, temperature 97.6 F (36.4 C), temperature source Oral, SpO2 98 %.  Gen. Pleasant, well-nourished, in no distress, normal affect   HEENT: [place]/AT, face symmetric, conjunctiva clear, no scleral icterus, PERRLA, EOMI, nares patent without drainage Lungs: no accessory muscle use, CTAB, no wheezes or rales Cardiovascular: RRR, no m/r/g, no peripheral edema Musculoskeletal: Chronic R knee edema s/p R TKR.  TTP of  R lateral knee, posterior knee, and anterior R thigh.  No deformity of right arm.   no cyanosis or clubbing, normal tone Neuro:  A&Ox3, CN II-XII intact, slowed gait.  Numbness in anterior R thigh. Skin:  Warm, dry, intact.  Right lateral forearm with healing abrasions, ecchymosis and TTP, mild edema.  No erythema.   Wt Readings from Last 3 Encounters:  03/01/21 182 lb (82.6 kg)  01/22/21 187 lb (84.8 kg)  12/01/20 188 lb (85.3 kg)    Lab Results  Component Value Date   WBC 8.1 07/31/2020   HGB 10.6 (L) 07/31/2020   HCT 31.3 (L) 07/31/2020   PLT 269 07/31/2020   GLUCOSE 238 (H) 07/30/2020   CHOL 338 (H) 03/02/2020   TRIG 397 (H) 03/02/2020   HDL 52 03/02/2020   LDLDIRECT 168.0 02/21/2019   LDLCALC 219 (H) 03/02/2020   ALT 13 07/27/2020   AST 12 (L) 07/27/2020   NA 129 (L) 07/30/2020   K 4.2 07/30/2020   CL 90 (L) 07/30/2020   CREATININE 0.92 07/30/2020   BUN 8 07/30/2020   CO2 32 07/30/2020   TSH 1.75 03/02/2020   INR 1.07 08/16/2016   HGBA1C 7.5 (A) 07/08/2020   MICROALBUR 0.4 05/22/2014    Assessment/Plan:  Hematoma -s/p contusion from dryer that kept spinning -stable -Discussed supportive care including ice, heat, massage, OTC analgesics. -continue to monitor  Chronic pain of right knee  -s/p R TKR with revision several yrs ago -Voltaren gel 3 times daily as needed -Stretching exercises encouraged -Encouraged to follow-up with Ortho -Discussed imaging.  Pt declines at this time. - Plan: cyclobenzaprine (FLEXERIL) 5 MG tablet  Numbness of left anterior thigh -likely 2/2 recent R THR 07/29/20 -consider other causes including nerve compression, vitamin deficiency, etc -consider labs for continued symptoms -f/u with Ortho. -continue to monitor  Essential hypertension -Elevated -Patient encouraged to take medication daily -Continue propranolol 40 mg  TID, lisinopril 5 mg, Norvasc 5 mg daily -lifestyle modifications encouraged.   F/u prn continued or  worsening symptoms.  F/u in 1 month for HTN and other chronic conditions.  More than 50% of over 40 minutes spent in total in caring for this patient face-to-face, counseling and/or coordinating care.   Grier Mitts, MD

## 2021-03-09 ENCOUNTER — Telehealth (HOSPITAL_COMMUNITY): Payer: Self-pay | Admitting: *Deleted

## 2021-03-09 NOTE — Telephone Encounter (Signed)
PA for dose increase of Wellbutrin XR to total dose of 450 mg qd submitted and awaiting approval. PA Case # [id number].

## 2021-03-09 NOTE — Telephone Encounter (Signed)
PA for increased dose of Wellbutrin XL(450 mg) approved by [employer]are Prescription Drug Coverage. Medication approved from 03/21/20 through 03/20/21.

## 2021-03-10 ENCOUNTER — Ambulatory Visit (HOSPITAL_COMMUNITY): Payer: Medicare Other | Admitting: Psychiatry

## 2021-03-23 ENCOUNTER — Encounter: Payer: Self-pay | Admitting: Family Medicine

## 2021-03-24 ENCOUNTER — Other Ambulatory Visit: Payer: Self-pay | Admitting: Family Medicine

## 2021-03-24 DIAGNOSIS — E1169 Type 2 diabetes mellitus with other specified complication: Secondary | ICD-10-CM

## 2021-04-06 ENCOUNTER — Other Ambulatory Visit: Payer: Self-pay | Admitting: Family Medicine

## 2021-04-09 ENCOUNTER — Other Ambulatory Visit: Payer: Self-pay | Admitting: Acute Care

## 2021-04-09 DIAGNOSIS — Z87891 Personal history of nicotine dependence: Secondary | ICD-10-CM

## 2021-04-22 ENCOUNTER — Telehealth: Payer: Self-pay | Admitting: Pharmacist

## 2021-04-22 NOTE — Chronic Care Management (AMB) (Signed)
Chronic Care Management Pharmacy Assistant   Name: Destiny Hughes  MRN: [id number] DOB: February 16, 1946  Destiny Hughes is an 76 y.o. year old female who presents for her initial CCM visit with the clinical pharmacist.  Reason for Encounter: Chart prep for initial visit with Destiny Hughes the clinical pharmacist on 04/28/2021 at 9:00 in office.    Conditions to be addressed/monitored: HTN, HLD, COPD, DMII, Dementia, and Hypothyroidism  Recent office visits:  03/08/2021 Destiny Mitts MD - Patient was seen for Hematoma and additional issues. Started Cyclobenzaprine 2.5 mg daily as needed. Follow up if symptoms worsen or fail to improve.  03/01/2021 Destiny Durand LPN - Medicare annual wellness exam.  Recent consult visits:  03/05/2021 Destiny Fredrickson MD (psychiatry) - Patient was seen for Major depressive disorder with single episode, in full remission. Increased Donepezil to 23 mg at bedtime. Discontinued Memantine. Follow up in 3 months.  01/27/2021 Destiny Fredrickson MD (psychiatry) - Patient was seen for Major depression recurrent chronic. Started Memantine titrate up to 10 mg twice daily. Decreased Bupripion to 150 mg 1 tablet  every morning. 3 every evening. Increase Donepezil  to 11.5 mg at bedtime. Follow up in 6 weeks.  01/22/2021 Destiny Apo MD (pulmonary) - Patient was seen for Chronic obstructive pulmonary disease, unspecified COPD type. Started Albuterol 2.5 mg - 3 mLs by nebulization every 6  hours as needed. No follow up noted.   12/01/2021 Destiny Perna MD (orthopedic) - Patient was seen for unilateral primary osteoarthritis, left knee. No medication changes. Follow up if symptoms worsen or fail to improve.   10/28/2020 Destiny Fredrickson MD (psychiatry) - Patient was seen for Major depression in remission. No medication changes. Follow up in 3 months.  10/23/2020 Destiny Apo MD (pulmonary) - Patient was seen for Chronic obstructive pulmonary disease, unspecified COPD type.  Changed Flonase to 2 sprays daily. Decreased Lisinopril to 5 mg daily. Discontinued Aspirin, Norco and Ondansetron. Follow up in 3 months sooner if needed.   Hospital visits:  None  Medications: Outpatient Encounter Medications as of 04/22/2021  Medication Sig   acetaminophen (TYLENOL) 500 MG tablet Take 1,000 mg by mouth every 6 (six) hours as needed for moderate pain.   albuterol (PROVENTIL) (2.5 MG/3ML) 0.083% nebulizer solution Take 3 mLs (2.5 mg total) by nebulization every 6 (six) hours as needed for wheezing or shortness of breath.   albuterol (VENTOLIN HFA) 108 (90 Base) MCG/ACT inhaler INHALE 2 PUFFS INTO THE LUNGS EVERY 6 HOURS AS NEEDED FOR WHEEZING OR SHORTNESS OF BREATH   amLODipine (NORVASC) 5 MG tablet Take 1 tablet (5 mg total) by mouth daily.   buPROPion (WELLBUTRIN XL) 150 MG 24 hr tablet Take 1 tablet (150 mg total) by mouth every morning. 3  qam   cyclobenzaprine (FLEXERIL) 5 MG tablet Take 0.5 tablets (2.5 mg total) by mouth daily as needed for muscle spasms.   diphenhydrAMINE (BENADRYL) 25 MG tablet Take 25 mg by mouth in the morning.   donepezil (ARICEPT) 23 MG TABS tablet 1  qhs   fluticasone (FLONASE) 50 MCG/ACT nasal spray Place 2 sprays into both nostrils daily.   gabapentin (NEURONTIN) 100 MG capsule TAKE ONE CAPSULE TWICE A DAY   ipratropium (ATROVENT) 0.03 % nasal spray PLACE 2 SPRAYS INTO BOTH NOSTRILS EVERY 12 HOURS   levothyroxine (SYNTHROID) 125 MCG tablet TAKE ONE TABLET AT BEDTIME   lisinopril (ZESTRIL) 5 MG tablet Take 1 tablet (5 mg total) by mouth daily.   melatonin 5 MG TABS  Take 5 mg by mouth at bedtime.   metFORMIN (GLUCOPHAGE) 500 MG tablet TAKE ONE TABLET TWICE DAILY WITH A MEAL   pantoprazole (PROTONIX) 40 MG tablet TAKE ONE TABLET DAILY (Patient not taking: Reported on 03/01/2021)   propranolol (INDERAL) 80 MG tablet Take 0.5 tablets (40 mg total) by mouth 3 (three) times daily.   rosuvastatin (CRESTOR) 5 MG tablet TAKE ONE TABLET EACH DAY    sodium chloride 1 g tablet TAKE ONE TABLET THREE TIMES DAILY   temazepam (RESTORIL) 30 MG capsule Take 1 capsule (30 mg total) by mouth at bedtime.   Tiotropium Bromide-Olodaterol (STIOLTO RESPIMAT) 2.5-2.5 MCG/ACT AERS Inhale 2 puffs into the lungs daily.   No facility-administered encounter medications on file as of 04/22/2021.  Fill History: ALBUTEROL SULFATE HFA 108 (90 BASE) MCG/ 02/02/2021 30   CYCLOBENZAPRINE HCL 5 MG TAB 03/08/2021 60   DONEPEZIL HCL 23 MG TAB 04/06/2021 30   FLUTICASONE PROPIONATE 50 MCG/ACT NASAL 12/29/2020 30   GABAPENTIN 100 MG CAP 03/24/2021 90   IPRATROPIUM BROMIDE 0.03% NASAL SOLN ML 01/04/2021 43   LEVOTHYROXINE SODIUM 125 MCG TAB 04/06/2021 90   LISINOPRIL 5 MG TAB 02/05/2021 90   PANTOPRAZOLE SODIUM 40 MG DR TAB 02/01/2021 90   PROPRANOLOL HCL 80 MG TAB 04/16/2021 90   ROSUVASTATIN CALCIUM 5 MG TAB 04/06/2021 30   TEMAZEPAM 30 MG CAP 03/24/2021 30   AMLODIPINE BESYLATE 5 MG TAB 02/16/2021 90   BUPROPION HCL ER (XL) 150 MG TAB 04/06/2021 30   METFORMIN HCL 500 MG TAB 03/24/2021 45   Have you seen any other providers since your last visit? **Patient denies any visits with other providers since her last visit.  Any changes in your medications or health? Patient denies any recent changes in her medications or her health.  Any side effects from any medications? Patient denies any side effects  Do you have an symptoms or problems not managed by your medications? Patient denies any issues with her medications.  Any concerns about your health right now? Patient mentions a couple times she couldn't remember something that recently happened.   Has your provider asked that you check blood pressure, blood sugar, or follow special diet at home? Patient doesn't recall if she has been told to do these things. Patient asked her husband and he did not think she was to do any of these things.   Do you get any type of exercise on a regular basis? Patient  denies any regular exercise.   Can you think of a goal you would like to reach for your health? Patient wishes she could breath better.  Do you have any problems getting your medications? Patient denies any issues getting medications.  Is there anything that you would like to discuss during the appointment? Patient would like to discuss her breathing issues and loss of memory.   Patients husband was advised to bring medications and supplements to appointment on 04/28/2021.   Note:  Patients husband answered the phone he was given the appointment information however, when I inquired as to if she was able to answer questions, he said yes and handed the phone to her.   Care Gaps: AWV - completed 03/01/2021 Last BP - 142/76 on 03/08/2021 Last A1C - 7.5 on 07/08/2020 Foot exam - never done Eye exam - never done Shingrix - never done Dexa scan - never done TDAP - over due Covid booster - overdue  Star Rating Drugs: Lisinopril 5 mg  - last filled  02/05/2021 90 DS at [facility] Rosuvastatin 5 mg - last filled 04/06/2021 30 DS at [facility] Metformin 500 mg - last filled 03/24/2021 45 DS at [facility] Pharmacist Assistant [phone]

## 2021-04-26 ENCOUNTER — Telehealth: Payer: Self-pay | Admitting: Pharmacist

## 2021-04-26 NOTE — Chronic Care Management (AMB) (Signed)
° ° °  Chronic Care Management Pharmacy Assistant   Name: Destiny Hughes  MRN: [id number] DOB: 1945-08-14  04/28/2021 APPOINTMENT REMINDER  [name]'s husband was reminded to have all medications, supplements and any blood glucose and blood pressure readings available for review with [name], Pharm. D, at her office visit on 04/28/2021 at 9:00.   Questions: Have you had any recent office visit or specialist visit outside of [facility]? Patients husband denies any recent visits outside of [facility]  Are there any concerns you would like to discuss during your office visit? Patients husband states she fell and bumped her head two weeks ago however, she is fine now.   Are you having any problems obtaining your medications? (Whether it pharmacy issues or cost)  Patients husband denies any issues getting medications.  If patient has any PAP medications ask if they are having any problems getting their PAP medication or refill? Patients husband denies any PAP medications.  Care Gaps: AWV - completed 03/01/2021 Last BP - 142/76 on 03/08/2021 Last A1C - 7.5 on 07/08/2020 Foot exam - never done Eye exam - never done Shingrix - never done Dexa scan - never done TDAP - over due Covid booster - overdue  Star Rating Drug: Lisinopril 5 mg  - last filled 02/05/2021 90 DS at [facility] Rosuvastatin 5 mg - last filled 04/06/2021 30 DS at [facility] Metformin 500 mg - last filled 03/24/2021 45 DS at [facility]  Any gaps in medications fill history?  No  Gennie Alma [facility]  Catering manager [phone]

## 2021-04-27 NOTE — Progress Notes (Signed)
Chronic Care Management Pharmacy Note  05/18/2021 Name:  Destiny Hughes MRN:  [id number] DOB:  Jul 31, 1945  Summary: LDL extremely elevated and not at goal < 70 BP not at goal < 140/90 A1c not at goal < 7%   Recommendations/Changes made from today's visit: -Recommended increasing to high intensity statin therapy based on extremely elevated LDL -Recommended repeat A1c and consider increasing metformin -Collaborated with PCP to prescribe glucometer -Recommended switching Benadryl to a safer alternative such as Claritin, Zyrtec, or Allegra -Collaborated with PCP to consider increase dose of cyclobenzaprine to 1 full tablet  Plan: Confirmed refills of all medications that patient filled at the pharmacy [name] daughter in law to call back to confirm medications missing Follow up after discussion with PCP   Subjective: Destiny Hughes is an 76 y.o. year old female who is a primary patient of Volanda Napoleon, Langley Adie, MD.  The CCM team was consulted for assistance with disease management and care coordination needs.    Engaged with patient face to face for initial visit in response to provider referral for pharmacy case management and/or care coordination services.   Consent to Services:  The patient was given the following information about Chronic Care Management services today, agreed to services, and gave verbal consent: 1. CCM service includes personalized support from designated clinical staff supervised by the primary care provider, including individualized plan of care and coordination with other care providers 2. 24/7 contact phone numbers for assistance for urgent and routine care needs. 3. Service will only be billed when office clinical staff spend 20 minutes or more in a month to coordinate care. 4. Only one practitioner may furnish and bill the service in a calendar month. 5.The patient may stop CCM services at any time (effective at the end of the month) by phone call to the  office staff. 6. The patient will be responsible for cost sharing (co-pay) of up to 20% of the service fee (after annual deductible is met). Patient agreed to services and consent obtained.  Patient Care Team: Billie Ruddy, MD as PCP - General (Family Medicine) Evans Lance, MD as PCP - Cardiology (Cardiology) Paralee Cancel, MD as Consulting Physician (Orthopedic Surgery) Darlin Coco, MD as Consulting Physician (Cardiology) Garlan Fair, MD as Consulting Physician (Gastroenterology) Nicholaus Bloom, MD (Anesthesiology) Viona Gilmore, [facility] as Pharmacist (Pharmacist)  Recent office visits: 03/08/2021 Grier Mitts MD - Patient was seen for Hematoma and additional issues. Started Cyclobenzaprine 2.5 mg daily as needed. Follow up if symptoms worsen or fail to improve.   03/01/2021 Glenna Durand LPN - Medicare annual wellness exam.  Recent consult visits: 03/05/2021 Norma Fredrickson MD (psychiatry) - Patient was seen for Major depressive disorder with single episode, in full remission. Increased Donepezil to 23 mg at bedtime. Discontinued Memantine. Follow up in 3 months.   01/27/2021 Norma Fredrickson MD (psychiatry) - Patient was seen for Major depression recurrent chronic. Started Memantine titrate up to 10 mg twice daily. Decreased Bupripion to 150 mg 1 tablet  every morning. 3 every evening. Increase Donepezil  to 11.5 mg at bedtime. Follow up in 6 weeks.   01/22/2021 Baltazar Apo MD (pulmonary) - Patient was seen for Chronic obstructive pulmonary disease, unspecified COPD type. Started Albuterol 2.5 mg - 3 mLs by nebulization every 6  hours as needed. No follow up noted.    12/01/2021 Rodell Perna MD (orthopedic) - Patient was seen for unilateral primary osteoarthritis, left knee. No medication changes. Follow up if symptoms  worsen or fail to improve.    10/28/2020 Norma Fredrickson MD (psychiatry) - Patient was seen for Major depression in remission. No medication changes. Follow  up in 3 months.   10/23/2020 Baltazar Apo MD (pulmonary) - Patient was seen for Chronic obstructive pulmonary disease, unspecified COPD type. Changed Flonase to 2 sprays daily. Decreased Lisinopril to 5 mg daily. Discontinued Aspirin, Norco and Ondansetron. Follow up in 3 months sooner if needed.   Hospital visits: None in previous 6 months   Objective:  Lab Results  Component Value Date   CREATININE 0.92 07/30/2020   BUN 8 07/30/2020   GFR 46.89 (L) 02/21/2019   GFRNONAA >60 07/30/2020   GFRAA 60 03/02/2020   NA 129 (L) 07/30/2020   K 4.2 07/30/2020   CALCIUM 8.6 (L) 07/30/2020   CO2 32 07/30/2020   GLUCOSE 238 (H) 07/30/2020    Lab Results  Component Value Date/Time   HGBA1C 7.5 (A) 07/08/2020 11:41 AM   HGBA1C 7.9 (A) 06/22/2020 01:57 PM   HGBA1C 9.2 (H) 03/02/2020 09:00 AM   HGBA1C 7.3 (H) 02/21/2019 02:41 PM   HGBA1C 6.4 04/28/2015 12:00 AM   GFR 46.89 (L) 02/21/2019 02:41 PM   GFR 67.57 02/14/2018 11:04 AM   MICROALBUR 0.4 05/22/2014 02:02 PM    Last diabetic Eye exam: No results found for: HMDIABEYEEXA  Last diabetic Foot exam: No results found for: HMDIABFOOTEX   Lab Results  Component Value Date   CHOL 338 (H) 03/02/2020   HDL 52 03/02/2020   LDLCALC 219 (H) 03/02/2020   LDLDIRECT 168.0 02/21/2019   TRIG 397 (H) 03/02/2020   CHOLHDL 6.5 (H) 03/02/2020    Hepatic Function Latest Ref Rng & Units 07/27/2020 03/02/2020 01/03/2018  Total Protein 6.5 - 8.1 g/dL 6.9 6.9 7.2  Albumin 3.5 - 5.0 g/dL 4.0 - 4.2  AST 15 - 41 U/L 12(L) 14 11  ALT 0 - 44 U/L '13 15 8  ' Alk Phosphatase 38 - 126 U/L 64 - 82  Total Bilirubin 0.3 - 1.2 mg/dL 0.6 0.7 <0.2  Bilirubin, Direct 0.1 - 0.5 mg/dL - - -    Lab Results  Component Value Date/Time   TSH 1.75 03/02/2020 09:00 AM   TSH 2.300 12/17/2018 11:24 AM   FREET4 1.48 12/17/2018 11:24 AM   FREET4 1.59 (H) 09/22/2016 01:03 PM    CBC Latest Ref Rng & Units 07/31/2020 07/30/2020 07/27/2020  WBC 4.0 - 10.5 K/uL 8.1 9.8 6.7   Hemoglobin 12.0 - 15.0 g/dL 10.6(L) 11.3(L) 13.5  Hematocrit 36.0 - 46.0 % 31.3(L) 33.7(L) 40.6  Platelets 150 - 400 K/uL 269 321 379    Lab Results  Component Value Date/Time   VD25OH 64 09/01/2015 11:00 AM   VD25OH 40 04/28/2015 03:25 PM   No  Clinical ASCVD:  The ASCVD Risk score (Arnett DK, et al., 2019) failed to calculate for the following reasons:   The valid total cholesterol range is 130 to 320 mg/dL    Depression screen [facility] 2/9 03/08/2021 03/01/2021 02/27/2020  Decreased Interest 3 0 0  Down, Depressed, Hopeless 0 1 0  PHQ - 2 Score 3 1 0  Altered sleeping 1 - 0  Tired, decreased energy 2 - 0  Change in appetite 0 - 0  Feeling bad or failure about yourself  0 - 0  Trouble concentrating 0 - 0  Moving slowly or fidgety/restless 0 - 0  Suicidal thoughts 0 - 0  PHQ-9 Score 6 - 0  Difficult doing work/chores - -  Not difficult at all  Some recent data might be hidden    CHA2DS2/VAS Stroke Risk Points  Current as of 5 days ago     6 >= 2 Points: High Risk  1 - 1.99 Points: Medium Risk  0 Points: Low Risk    Last Change: N/A      Details    This score determines the patient's risk of having a stroke if the  patient has atrial fibrillation.       Points Metrics  1 Has Congestive Heart Failure:  Yes    Current as of 5 days ago  0 Has Vascular Disease:  No    Current as of 5 days ago  1 Has Hypertension:  Yes    Current as of 5 days ago  2 Age:  65    Current as of 5 days ago  1 Has Diabetes:  Yes    Current as of 5 days ago  0 Had Stroke:  No  Had TIA:  No  Had Thromboembolism:  No    Current as of 5 days ago  1 Female:  Yes    Current as of 5 days ago       Social History   Tobacco Use  Smoking Status Former   Packs/day: 0.75   Years: 50.00   Pack years: 37.50   Types: Cigarettes   Start date: 09/13/1961   Quit date: 05/2016   Years since quitting: 5.0  Smokeless Tobacco Never  Tobacco Comments   Decreased Down to 2/3 a Day if Thinking [place]ouit  It   BP Readings from Last 3 Encounters:  04/28/21 (!) 148/80  03/08/21 (!) 142/76  01/22/21 (!) 122/56   Pulse Readings from Last 3 Encounters:  03/08/21 64  01/22/21 70  10/23/20 72   Wt Readings from Last 3 Encounters:  03/01/21 182 lb (82.6 kg)  01/22/21 187 lb (84.8 kg)  12/01/20 188 lb (85.3 kg)   BMI Readings from Last 3 Encounters:  03/01/21 28.94 kg/m  01/22/21 30.18 kg/m  12/01/20 30.81 kg/m    Assessment/Interventions: Review of patient past medical history, allergies, medications, health status, including review of consultants reports, laboratory and other test data, was performed as part of comprehensive evaluation and provision of chronic care management services.   SDOH:  (Social Determinants of Health) assessments and interventions performed: Yes SDOH Interventions    Flowsheet Row Most Recent Value  SDOH Interventions   Financial Strain Interventions Intervention Not Indicated  Transportation Interventions Intervention Not Indicated      SDOH Screenings   Alcohol Screen: Not on file  Depression (PHQ2-9): Medium Risk   PHQ-2 Score: 6  Financial Resource Strain: Low Risk    Difficulty of Paying Living Expenses: Not very hard  Food Insecurity: No Food Insecurity   Worried About Charity fundraiser in the Last Year: Never true   Ran Out of Food in the Last Year: Never true  Housing: Not on file  Physical Activity: Inactive   Days of Exercise per Week: 0 days   Minutes of Exercise per Session: 0 min  Social Connections: Not on file  Stress: Stress Concern Present   Feeling of Stress : To some extent  Tobacco Use: Medium Risk   Smoking Tobacco Use: Former   Smokeless Tobacco Use: Never   Passive Exposure: Not on file  Transportation Needs: No Transportation Needs   Lack of Transportation (Medical): No   Lack of Transportation (Non-Medical): No   Patients  husband states she fell and bumped her head two weeks ago but she is feeling better.  Patient has a small knot on her head and does not remember what happened with her fall. She was standing up from the sink and just woke up on the floor. She denies any dizziness and recommended follow up with PCP.  Patient has had to use a cane more and her husband also uses a cane. She mostly uses it for knee pain. Patient does walk around the house and gets in the car easily but is not going shopping anymore.   Patient denies any concerns with her medications causing side effects. She is unsure if her muscle relaxer is helping and inquired about increasing the dose.  Patient reports she still only sleeps 4 hours with the temazepam. She also gets irritable when she tries to lay back down to go to bed. She is working on getting a Physicist, medical but she also used to work nights and thinks this has messed with her sleep cycle. She also admits that her short term memory has been getting worse.  Her son's wife manages her medications and has had some issues with getting them straightened out in the past. She takes them at 4 times a day.   CCM Care Plan  Allergies  Allergen Reactions   Albuterol Shortness Of Breath and Other (See Comments)   Penicillins Anaphylaxis, Swelling and Other (See Comments)    Has patient had a PCN reaction causing immediate rash, facial/tongue/throat swelling, SOB or lightheadedness with hypotension: Yes Has patient had a PCN reaction causing severe rash involving mucus membranes or skin necrosis: Rash Has patient had a PCN reaction that required hospitalization: No Has patient had a PCN reaction occurring within the last 10 years: No If all of the above answers are "NO", then may proceed with Cephalosporin use.   Sulfa Antibiotics Swelling and Other (See Comments)    Throat swells, but no shortness of breath noted   Codeine Nausea And Vomiting   Ecotrin [Aspirin] Nausea And Vomiting   Zanaflex [Tizanidine] Other (See Comments)    Possible confusion    Medications  Reviewed Today     Reviewed by Viona Gilmore, [facility] (Pharmacist) on 04/28/21 at [id number]  Med List Status: <None>   Medication Order Taking? Sig Documenting Provider Last Dose Status Informant  acetaminophen (TYLENOL) 500 MG tablet [id number]  Take 1,000 mg by mouth every 6 (six) hours as needed for moderate pain. [provider]  Active Spouse/Significant Other  albuterol (PROVENTIL) (2.5 MG/3ML) 0.083% nebulizer solution [id number] No Take 3 mLs (2.5 mg total) by nebulization every 6 (six) hours as needed for wheezing or shortness of breath.  Patient not taking: Reported on 04/28/2021   Collene Gobble, MD Not Taking Active   albuterol (VENTOLIN HFA) 108 (90 Base) MCG/ACT inhaler [id number] Yes INHALE 2 PUFFS INTO THE LUNGS EVERY 6 HOURS AS NEEDED FOR WHEEZING OR SHORTNESS OF BREATH Collene Gobble, MD Taking Active   amLODipine (NORVASC) 5 MG tablet [id number] Yes Take 1 tablet (5 mg total) by mouth daily. Marybelle Killings, MD Taking Active Spouse/Significant Other  buPROPion (WELLBUTRIN XL) 150 MG 24 hr tablet [id number]  Take 1 tablet (150 mg total) by mouth every morning. 3  qam Plovsky, Gerald, MD  Active   cyclobenzaprine (FLEXERIL) 5 MG tablet [id number] Yes Take 0.5 tablets (2.5 mg total) by mouth daily as needed for muscle spasms. Billie Ruddy, MD Taking Active   diphenhydrAMINE (  BENADRYL) 25 MG tablet [id number] Yes Take 25 mg by mouth in the morning. [provider] Taking Active Spouse/Significant Other  donepezil (ARICEPT) 23 MG TABS tablet [id number] Yes 1  qhs Plovsky, Berneta Sages, MD Taking Active   fluticasone (FLONASE) 50 MCG/ACT nasal spray [id number] Yes Place 2 sprays into both nostrils daily. Collene Gobble, MD Taking Active   gabapentin (NEURONTIN) 100 MG capsule [id number]  TAKE ONE CAPSULE TWICE A DAY Billie Ruddy, MD  Active   ipratropium (ATROVENT) 0.03 % nasal spray [id number] Yes PLACE 2 SPRAYS INTO BOTH NOSTRILS EVERY 12 HOURS Byrum, Rose Fillers, MD Taking Active  Spouse/Significant Other  levothyroxine (SYNTHROID) 125 MCG tablet [id number] Yes TAKE ONE TABLET AT BEDTIME Billie Ruddy, MD Taking Active   lisinopril (ZESTRIL) 5 MG tablet [id number] Yes Take 1 tablet (5 mg total) by mouth daily. Evans Lance, MD Taking Active   melatonin 5 MG TABS [id number] Yes Take 5 mg by mouth at bedtime. [provider] Taking Active Spouse/Significant Other  metFORMIN (GLUCOPHAGE) 500 MG tablet [id number] Yes TAKE ONE TABLET TWICE DAILY WITH A MEAL Billie Ruddy, MD Taking Active   pantoprazole (PROTONIX) 40 MG tablet [id number] No TAKE ONE TABLET DAILY  Patient not taking: Reported on 03/01/2021   Billie Ruddy, MD Unknown Active   propranolol (INDERAL) 80 MG tablet [id number] Yes Take 0.5 tablets (40 mg total) by mouth 3 (three) times daily. Evans Lance, MD Taking Active Spouse/Significant Other  rosuvastatin (CRESTOR) 5 MG tablet [id number] Yes TAKE ONE TABLET EACH DAY Billie Ruddy, MD Taking Active   sodium chloride 1 g tablet [id number] Yes TAKE ONE TABLET THREE TIMES DAILY Billie Ruddy, MD Taking Active   temazepam (RESTORIL) 30 MG capsule [id number] Yes Take 1 capsule (30 mg total) by mouth at bedtime. Norma Fredrickson, MD Taking Active   Tiotropium Bromide-Olodaterol (STIOLTO RESPIMAT) 2.5-2.5 MCG/ACT AERS [id number] Yes Inhale 2 puffs into the lungs daily. Lauraine Rinne, NP Taking Active Spouse/Significant Other            Patient Active Problem List   Diagnosis Date Noted   Status post total hip replacement, right 07/29/2020   Diabetes mellitus ([place]) 07/08/2020   Unilateral primary osteoarthritis, left knee 03/30/2020   History of fusion of cervical spine 07/03/2019   Spinal cord stimulator dysfunction ([place]) 06/04/2018   Chronic cough 05/21/2018   Tremor 02/16/2018   Chronic rhinitis 02/05/2018   Hallucinations    Psychosis ([place]) 11/07/2016   Acute respiratory failure with hypoxia and hypercapnia ([place]) 11/03/2016   Other  chest pain 10/23/2016   Tobacco abuse 10/23/2016   Major depression with psychotic features ([place]) 10/19/2016   Dementia with behavioral disturbance 10/19/2016   Depression due to physical illness    Cognitive retention disorder    Atrial flutter ([place]) 08/15/2016   Paroxysmal atrial fibrillation ([facility]) 08/15/2016   SVT (supraventricular tachycardia) ([place]) 07/11/2016   Weight loss 07/08/2016   AKI (acute kidney injury) ([place]) 07/02/2016   Acute kidney injury ([place]) 07/01/2016   Hyperglycemia 07/01/2016   Hyponatremia 07/01/2016   Intractable nausea and vomiting 07/01/2016   Abnormal EKG 15/72/6203   Diastolic CHF ([place]) 55/97/4163   Medication management 04/28/2015   Emphysema lung ([place]) 12/26/2014   Gastroesophageal reflux disease without esophagitis 12/26/2014   Chronic bronchitis ([place]) 11/13/2014   COPD (chronic obstructive pulmonary disease) ([place]) 10/03/2014   At high risk for falls 10/03/2014   Other specified hypothyroidism 10/03/2014   Mitral valve prolapse 10/03/2014  Encounter for Medicare annual wellness exam 10/03/2014   IBS (irritable bowel syndrome) 04/01/2014   S/P total knee replacement 04/22/2013   Knee pain, chronic 04/22/2013   Essential hypertension 04/15/2013   Vitamin D deficiency 04/15/2013   Encounter for long-term (current) use of medications 04/15/2013   Prediabetes 04/15/2013   Hyperlipemia    Glaucoma    Chronic pain disorder    Chronic back pain     Immunization History  Administered Date(s) Administered   Fluad Quad(high Dose 65+) 12/17/2018, 12/23/2019, 12/24/2020   Influenza, High Dose Seasonal PF 04/13/2017, 12/05/2017   PFIZER(Purple Top)SARS-COV-2 Vaccination 04/26/2019, 05/21/2019, 01/18/2020   Pneumococcal Conjugate-13 10/03/2014   Pneumococcal Polysaccharide-23 10/18/2011   Td 03/21/2010    Conditions to be addressed/monitored:  Hypertension, Hyperlipidemia, Diabetes, Atrial Fibrillation, GERD, COPD, Hypothyroidism, and Vitamin D  deficiency, insomnia, memory loss  Care Plan : [place]  Updates made by Viona Gilmore, [place] since 05/18/2021 12:00 AM     Problem: Problem: Hypertension, Hyperlipidemia, Diabetes, Atrial Fibrillation, GERD, COPD, Hypothyroidism, and Vitamin D deficiency, insomnia, memory loss      Long-Range Goal: Patient-Specific Goal   Start Date: 04/28/2021  Expected End Date: 04/28/2022  This Visit's Progress: On track  Priority: High  Note:   Current Barriers:  Unable to independently monitor therapeutic efficacy Unable to achieve control of cholesterol  Unable to maintain control of blood pressure Unable to self administer medications as prescribed  Pharmacist Clinical Goal(s):  Patient will achieve adherence to monitoring guidelines and medication adherence to achieve therapeutic efficacy achieve control of cholesterol as evidenced by next lipid panel maintain control of blood pressure as evidenced by home and office BP readings  through collaboration with PharmD and provider.   Interventions: 1:1 collaboration with Billie Ruddy, MD regarding development and update of comprehensive plan of care as evidenced by provider attestation and co-signature Inter-disciplinary care team collaboration (see longitudinal plan of care) Comprehensive medication review performed; medication list updated in electronic medical record  Hypertension (BP goal <140/90) -Uncontrolled -Current treatment: Amlodipine 5 mg 1 tablet daily - Appropriate, Query effective, Safe, Accessible Propranolol 80 mg 1/2 tablet three times daily - Query Appropriate, Query effective, Safe, Accessible Lisinopril 5 mg 1 tablet daily - Appropriate, Query effective, Safe, Accessible -Medications previously tried: unknown  -Current home readings: owns an arm BP cuff but does not check routinely at home -Current dietary habits: craves salt - has low sodium levels and doesn't limit salt intake -Current exercise habits:  unable to exercise -Denies hypotensive/hypertensive symptoms -Educated on BP goals and benefits of medications for prevention of heart attack, stroke and kidney damage; Importance of home blood pressure monitoring; Proper BP monitoring technique; Symptoms of hypotension and importance of maintaining adequate hydration; -Counseled to monitor BP at home at least weekly, document, and provide log at future appointments -Counseled on diet and exercise extensively Recommended to continue current medication  Hyperlipidemia: (LDL goal < 70) -Uncontrolled -Current treatment: Rosuvastatin 5 mg 1 tablet daily - Appropriate, Query effective, Safe, Accessible -Medications previously tried: none  -Current dietary patterns: eating a lot of ice cream; not eating a lot of foods; ham biscuit; eating out a lot; not many fruits and vegetables but does enjoy green vegetables, melons and pineapple -Current exercise habits: unable to -Educated on Cholesterol goals;  Benefits of statin for ASCVD risk reduction; Importance of limiting foods high in cholesterol; -Counseled on diet and exercise extensively Recommended increasing to high intensity statin therapy based on extremely elevated LDL.  Diabetes (A1c goal <7%) -Uncontrolled -Current medications: Metformin 500 mg 1 tablet twice daily - Appropriate, Query effective, Safe, Accessible -Medications previously tried: none  -Current home glucose readings fasting glucose: does not check post prandial glucose: does not check -Denies hypoglycemic/hyperglycemic symptoms -Current meal patterns:  breakfast: biscuit with ham, egg and cheese  lunch: did not discuss  dinner: did not discuss snacks: ice cream; melons, pineapple drinks: did not discuss -Current exercise: unable to -Educated on A1c and blood sugar goals; Benefits of routine self-monitoring of blood sugar; -Counseled to check feet daily and get yearly eye exams -Counseled on diet and exercise  extensively Recommended repeat A1c and consider increasing metformin. Collaborated with PCP to prescribe glucometer.   COPD (Goal: control symptoms and prevent exacerbations) -Not ideally controlled -Current treatment  Albuterol HFA as needed  - Appropriate, Effective, Safe, Accessible Albuterol nebulizer as needed - not taking Stiolto respimat 2.5-2.5 mcg/act 2 puffs daily - Appropriate, Query effective, Safe, Query accessible -Medications previously tried: unknown  -Gold Grade: Gold 3 (FEV1 30-49%) -Current COPD Classification:  B (high sx, <2 exacerbations/yr) -MMRC/CAT score: 3 -Pulmonary function testing: 2020 -Exacerbations requiring treatment in last 6 months: none -Patient reports consistent use of maintenance inhaler -Frequency of rescue inhaler use: at least daily -Counseled on Proper inhaler technique; Benefits of consistent maintenance inhaler use When to use rescue inhaler Differences between maintenance and rescue inhalers -Assessed patient finances. Patient may qualify for Stiolto patient assistance.   Depression (Goal: minimize symptoms) -Controlled -Current treatment: Bupropion XL 150 mg 3 tablets daily - Appropriate, Effective, Safe, Accessible -Medications previously tried/failed: unknown -PHQ9: 6 -GAD7: n/a -Educated on Benefits of medication for symptom control Benefits of cognitive-behavioral therapy with or without medication -Recommended to continue current medication  Insomnia (Goal: improve quality and quantity of sleep) -Uncontrolled -Current treatment  Melatonin 3 or 5 mg 1 tablet daily at bedtime as needed - Appropriate, Query effective, Safe, Accessible Temazepam 30 mg 1 capsule at bedtime - Appropriate, Query effective, Query Safe, Accessible -Medications previously tried: not sure about other medications  -Recommended switching to safer long term medication for sleep such as trazodone. Educated on practicing good sleep hygiene by setting a  sleep schedule and maintaining it, avoid excessive napping, following a nightly routine, avoiding screen time for 30-60 minutes before going to bed, and making the bedroom a cool, quiet and dark space.  GERD (Goal: minimize symptoms) -Controlled -Current treatment  Pantoprazole 40 mg 1 tablet daily  - Appropriate, Effective, Safe, Accessible -Medications previously tried: unknown  -Recommended to continue current medication  Hypothyroidism (Goal: TSH 0.35-4.5) -Controlled -Current treatment  Levothyroxine 125 mcg 1 tablet at bedtime - Appropriate, Effective, Safe, Accessible -Medications previously tried: none  -Counseled on importance of taking on an empty stomach and with water.  Allergic rhinitis (Goal: minimize symptoms) -Not ideally controlled -Current treatment  Fluticasone nasal spray 2 sprays in each nostril daily - Appropriate, Query effective, Safe, Accessible Ipratropium 0.03% 2 sprays in both nostrils daily - Appropriate, Query effective, Safe, Accessible Diphenhydramine 25 mg 1 tablet daily as needed - Query Appropriate, Query effective, Query Safe, Accessible  -Medications previously tried: Claritin (unknown)  -Recommended switching Benadryl to a safer alternative such as Claritin, Zyrtec, or Allegra.  Memory loss (Goal: slow progression of memory loss) -Not ideally controlled -Current treatment  Donepezil 23 mg 1 tablet at bedtime - Appropriate, Query effective, Safe, Accessible -Medications previously tried: Namenda (stopped on her own)  -Recommended to continue current medication  Pain/muscle spasms (Goal: minimize  symptoms) -Not ideally controlled -Current treatment  Gabapentin 100 mg 1 capsule twice daily - Appropriate, Effective, Safe, Accessible Cyclobenzaprine 5 mg 1/2 tablet as needed for muscle spasms - Appropriate, Query effective, Safe, Accessible -Medications previously tried: none  -Collaborated with PCP to increase dose of cyclobenzaprine to 1 full  tablet.  Health Maintenance -Vaccine gaps: shingrix, COVID booster, tetanus -Current therapy:  Sodium chloride 1 g 1 tablet three times daily (trouble swallowing) - Appropriate, Effective, Safe, Accessible -Educated on Cost vs benefit of each product must be carefully weighed by individual consumer -Patient is satisfied with current therapy and denies issues -Recommended to continue current medication  Patient Goals/Self-Care Activities Patient will:  - take medications as prescribed as evidenced by patient report and record review check glucose when symptomatic, document, and provide at future appointments check blood pressure at least weekly, document, and provide at future appointments  Follow Up Plan: The care management team will reach out to the patient again over the next 30 days.          Medication Assistance: None required.  Patient affirms current coverage meets needs.  Compliance/Adherence/Medication fill history: Care Gaps: COVID booster, tetanus, DEXA scan, shingrix, eye exam, foot exam Last BP - 142/76 on 03/08/2021 Last A1C - 7.5 on 07/08/2020  [facility]: Lisinopril 5 mg  - last filled 02/05/2021 90 DS at [facility] Rosuvastatin 5 mg - last filled 04/06/2021 30 DS at [facility] Metformin 500 mg - last filled 03/24/2021 45 DS at [name]  Patient's preferred pharmacy is:  [facility] [place] - [address] [place] [address] Phone: [phone] Fax: [phone]  [id number] [place] [address] [id number]: [phone]: [phone] Uses pill box? Yes Pt endorses 90% compliance - missing medications  We discussed: Benefits of medication synchronization, packaging and delivery as well as enhanced pharmacist oversight with [facility]. Patient decided to: Continue current medication management  strategy  Care Plan and Follow Up Patient Decision:  Patient agrees to Care Plan and Follow-up.  Plan: The care management team will reach out to the patient again over the next 30 days.  MJeni Salles PharmD, [place]ores[place]armacist [place]

## 2021-04-28 ENCOUNTER — Ambulatory Visit (INDEPENDENT_AMBULATORY_CARE_PROVIDER_SITE_OTHER): Payer: Medicare Other | Admitting: Pharmacist

## 2021-04-28 VITALS — BP 148/80

## 2021-04-28 DIAGNOSIS — I10 Essential (primary) hypertension: Secondary | ICD-10-CM

## 2021-04-28 DIAGNOSIS — E039 Hypothyroidism, unspecified: Secondary | ICD-10-CM

## 2021-04-29 ENCOUNTER — Ambulatory Visit: Payer: Medicare Other

## 2021-04-30 ENCOUNTER — Ambulatory Visit
Admission: RE | Admit: 2021-04-30 | Discharge: 2021-04-30 | Disposition: A | Discharge status: Home / Self Care | Payer: Medicare Other | Source: Ambulatory Visit | Attending: Acute Care | Admitting: Acute Care

## 2021-04-30 ENCOUNTER — Other Ambulatory Visit: Payer: Self-pay

## 2021-04-30 DIAGNOSIS — Z87891 Personal history of nicotine dependence: Secondary | ICD-10-CM | POA: Diagnosis not present

## 2021-04-30 DIAGNOSIS — J929 Pleural plaque without asbestos: Secondary | ICD-10-CM | POA: Diagnosis not present

## 2021-04-30 DIAGNOSIS — J432 Centrilobular emphysema: Secondary | ICD-10-CM | POA: Diagnosis not present

## 2021-04-30 DIAGNOSIS — I2510 Atherosclerotic heart disease of native coronary artery without angina pectoris: Secondary | ICD-10-CM | POA: Diagnosis not present

## 2021-05-03 ENCOUNTER — Other Ambulatory Visit: Payer: Self-pay

## 2021-05-03 DIAGNOSIS — Z87891 Personal history of nicotine dependence: Secondary | ICD-10-CM

## 2021-05-06 ENCOUNTER — Other Ambulatory Visit: Payer: Self-pay | Admitting: Family Medicine

## 2021-05-06 DIAGNOSIS — G8929 Other chronic pain: Secondary | ICD-10-CM

## 2021-05-13 ENCOUNTER — Other Ambulatory Visit: Payer: Self-pay | Admitting: Internal Medicine

## 2021-05-18 DIAGNOSIS — Z7984 Long term (current) use of oral hypoglycemic drugs: Secondary | ICD-10-CM

## 2021-05-18 DIAGNOSIS — I10 Essential (primary) hypertension: Secondary | ICD-10-CM | POA: Diagnosis not present

## 2021-05-18 DIAGNOSIS — E1159 Type 2 diabetes mellitus with other circulatory complications: Secondary | ICD-10-CM

## 2021-05-18 DIAGNOSIS — J449 Chronic obstructive pulmonary disease, unspecified: Secondary | ICD-10-CM

## 2021-05-18 DIAGNOSIS — E039 Hypothyroidism, unspecified: Secondary | ICD-10-CM

## 2021-05-18 DIAGNOSIS — F32A Depression, unspecified: Secondary | ICD-10-CM

## 2021-05-18 DIAGNOSIS — E785 Hyperlipidemia, unspecified: Secondary | ICD-10-CM

## 2021-05-18 NOTE — Patient Instructions (Signed)
Hi Destiny Hughes,  It was great to get to meet you in person! Below is a summary of some of the topics we discussed.   Please reach out to me if you have any questions or need anything before our follow up!  Best, Maddie  [name], PharmD, [place] Pharmacist [facility] [place]   Visit Information   Goals Addressed             This Visit's Progress    Manage My Medicine       Timeframe:  Long-Range Goal Priority:  Medium Start Date:                             Expected End Date:                       Follow Up Date 07/26/21    - keep a list of all the medicines I take; vitamins and herbals too - learn to read medicine labels - use a pillbox to sort medicine    Why is this important?   These steps will help you keep on track with your medicines.   Notes:      Track and Manage My Blood Pressure-Hypertension       Timeframe:  Long-Range Goal Priority:  High Start Date:                             Expected End Date:                       Follow Up Date 06/26/21    - check blood pressure weekly - choose a place to take my blood pressure (home, clinic or office, retail store) - write blood pressure results in a log or diary    Why is this important?   You won't feel high blood pressure, but it can still hurt your blood vessels.  High blood pressure can cause heart or kidney problems. It can also cause a stroke.  Making lifestyle changes like losing a little weight or eating less salt will help.  Checking your blood pressure at home and at different times of the day can help to control blood pressure.  If the doctor prescribes medicine remember to take it the way the doctor ordered.  Call the office if you cannot afford the medicine or if there are questions about it.     Notes:        Patient Care Plan: CCM Pharmacy Care Plan     Problem Identified: Problem: Hypertension, Hyperlipidemia, Diabetes, Atrial Fibrillation, GERD,  COPD, Hypothyroidism, and Vitamin D deficiency, insomnia, memory loss      Long-Range Goal: Patient-Specific Goal   Start Date: 04/28/2021  Expected End Date: 04/28/2022  This Visit's Progress: On track  Priority: High  Note:   Current Barriers:  Unable to independently monitor therapeutic efficacy Unable to achieve control of cholesterol  Unable to maintain control of blood pressure Unable to self administer medications as prescribed  Pharmacist Clinical Goal(s):  Patient will achieve adherence to monitoring guidelines and medication adherence to achieve therapeutic efficacy achieve control of cholesterol as evidenced by next lipid panel maintain control of blood pressure as evidenced by home and office BP readings  through collaboration with PharmD and provider.   Interventions: 1:1 collaboration with Billie Ruddy, MD regarding development and  update of comprehensive plan of care as evidenced by provider attestation and co-signature Inter-disciplinary care team collaboration (see longitudinal plan of care) Comprehensive medication review performed; medication list updated in electronic medical record  Hypertension (BP goal <140/90) -Uncontrolled -Current treatment: Amlodipine 5 mg 1 tablet daily - Appropriate, Query effective, Safe, Accessible Propranolol 80 mg 1/2 tablet three times daily - Query Appropriate, Query effective, Safe, Accessible Lisinopril 5 mg 1 tablet daily - Appropriate, Query effective, Safe, Accessible -Medications previously tried: unknown  -Current home readings: owns an arm BP cuff but does not check routinely at home -Current dietary habits: craves salt - has low sodium levels and doesn't limit salt intake -Current exercise habits: unable to exercise -Denies hypotensive/hypertensive symptoms -Educated on BP goals and benefits of medications for prevention of heart attack, stroke and kidney damage; Importance of home blood pressure monitoring; Proper BP  monitoring technique; Symptoms of hypotension and importance of maintaining adequate hydration; -Counseled to monitor BP at home at least weekly, document, and provide log at future appointments -Counseled on diet and exercise extensively Recommended to continue current medication  Hyperlipidemia: (LDL goal < 70) -Uncontrolled -Current treatment: Rosuvastatin 5 mg 1 tablet daily - Appropriate, Query effective, Safe, Accessible -Medications previously tried: none  -Current dietary patterns: eating a lot of ice cream; not eating a lot of foods; ham biscuit; eating out a lot; not many fruits and vegetables but does enjoy green vegetables, melons and pineapple -Current exercise habits: unable to -Educated on Cholesterol goals;  Benefits of statin for ASCVD risk reduction; Importance of limiting foods high in cholesterol; -Counseled on diet and exercise extensively Recommended increasing to high intensity statin therapy based on extremely elevated LDL.  Diabetes (A1c goal <7%) -Uncontrolled -Current medications: Metformin 500 mg 1 tablet twice daily - Appropriate, Query effective, Safe, Accessible -Medications previously tried: none  -Current home glucose readings fasting glucose: does not check post prandial glucose: does not check -Denies hypoglycemic/hyperglycemic symptoms -Current meal patterns:  breakfast: biscuit with ham, egg and cheese  lunch: did not discuss  dinner: did not discuss snacks: ice cream; melons, pineapple drinks: did not discuss -Current exercise: unable to -Educated on A1c and blood sugar goals; Benefits of routine self-monitoring of blood sugar; -Counseled to check feet daily and get yearly eye exams -Counseled on diet and exercise extensively Recommended repeat A1c and consider increasing metformin. Collaborated with PCP to prescribe glucometer.   COPD (Goal: control symptoms and prevent exacerbations) -Not ideally controlled -Current treatment   Albuterol HFA as needed  - Appropriate, Effective, Safe, Accessible Albuterol nebulizer as needed - not taking Stiolto respimat 2.5-2.5 mcg/act 2 puffs daily - Appropriate, Query effective, Safe, Query accessible -Medications previously tried: unknown  -Gold Grade: Gold 3 (FEV1 30-49%) -Current COPD Classification:  B (high sx, <2 exacerbations/yr) -MMRC/CAT score: 3 -Pulmonary function testing: 2020 -Exacerbations requiring treatment in last 6 months: none -Patient reports consistent use of maintenance inhaler -Frequency of rescue inhaler use: at least daily -Counseled on Proper inhaler technique; Benefits of consistent maintenance inhaler use When to use rescue inhaler Differences between maintenance and rescue inhalers -Assessed patient finances. Patient may qualify for Stiolto patient assistance.   Depression (Goal: minimize symptoms) -Controlled -Current treatment: Bupropion XL 150 mg 3 tablets daily - Appropriate, Effective, Safe, Accessible -Medications previously tried/failed: unknown -PHQ9: 6 -GAD7: n/a -Educated on Benefits of medication for symptom control Benefits of cognitive-behavioral therapy with or without medication -Recommended to continue current medication  Insomnia (Goal: improve quality and quantity of sleep) -Uncontrolled -  Current treatment  Melatonin 3 or 5 mg 1 tablet daily at bedtime as needed - Appropriate, Query effective, Safe, Accessible Temazepam 30 mg 1 capsule at bedtime - Appropriate, Query effective, Query Safe, Accessible -Medications previously tried: not sure about other medications  -Recommended switching to safer long term medication for sleep such as trazodone. Educated on practicing good sleep hygiene by setting a sleep schedule and maintaining it, avoid excessive napping, following a nightly routine, avoiding screen time for 30-60 minutes before going to bed, and making the bedroom a cool, quiet and dark space.  GERD (Goal: minimize  symptoms) -Controlled -Current treatment  Pantoprazole 40 mg 1 tablet daily  - Appropriate, Effective, Safe, Accessible -Medications previously tried: unknown  -Recommended to continue current medication  Hypothyroidism (Goal: TSH 0.35-4.5) -Controlled -Current treatment  Levothyroxine 125 mcg 1 tablet at bedtime - Appropriate, Effective, Safe, Accessible -Medications previously tried: none  -Counseled on importance of taking on an empty stomach and with water.  Allergic rhinitis (Goal: minimize symptoms) -Not ideally controlled -Current treatment  Fluticasone nasal spray 2 sprays in each nostril daily - Appropriate, Query effective, Safe, Accessible Ipratropium 0.03% 2 sprays in both nostrils daily - Appropriate, Query effective, Safe, Accessible Diphenhydramine 25 mg 1 tablet daily as needed - Query Appropriate, Query effective, Query Safe, Accessible  -Medications previously tried: Claritin (unknown)  -Recommended switching Benadryl to a safer alternative such as Claritin, Zyrtec, or Allegra.  Memory loss (Goal: slow progression of memory loss) -Not ideally controlled -Current treatment  Donepezil 23 mg 1 tablet at bedtime - Appropriate, Query effective, Safe, Accessible -Medications previously tried: Namenda (stopped on her own)  -Recommended to continue current medication  Pain/muscle spasms (Goal: minimize symptoms) -Not ideally controlled -Current treatment  Gabapentin 100 mg 1 capsule twice daily - Appropriate, Effective, Safe, Accessible Cyclobenzaprine 5 mg 1/2 tablet as needed for muscle spasms - Appropriate, Query effective, Safe, Accessible -Medications previously tried: none  -Collaborated with PCP to increase dose of cyclobenzaprine to 1 full tablet.  Health Maintenance -Vaccine gaps: shingrix, COVID booster, tetanus -Current therapy:  Sodium chloride 1 g 1 tablet three times daily (trouble swallowing) - Appropriate, Effective, Safe, Accessible -Educated on  Cost vs benefit of each product must be carefully weighed by individual consumer -Patient is satisfied with current therapy and denies issues -Recommended to continue current medication  Patient Goals/Self-Care Activities Patient will:  - take medications as prescribed as evidenced by patient report and record review check glucose when symptomatic, document, and provide at future appointments check blood pressure at least weekly, document, and provide at future appointments  Follow Up Plan: The care management team will reach out to the patient again over the next 30 days.       Ms. Hebel was given information about Chronic Care Management services today including:  CCM service includes personalized support from designated clinical staff supervised by her physician, including individualized plan of care and coordination with other care providers 24/7 contact phone numbers for assistance for urgent and routine care needs. Standard insurance, coinsurance, copays and deductibles apply for chronic care management only during months in which we provide at least 20 minutes of these services. Most insurances cover these services at 100%, however patients may be responsible for any copay, coinsurance and/or deductible if applicable. This service may help you avoid the need for more expensive face-to-face services. Only one practitioner may furnish and bill the service in a calendar month. The patient may stop CCM services at any time (effective at  the end of the month) by phone call to the office staff.  Patient agreed to services and verbal consent obtained.   The patient verbalized understanding of instructions, educational materials, and care plan provided today and agreed to receive a mailed copy of patient instructions, educational materials, and care plan.  The pharmacy team will reach out to the patient again over the next 30 days.   Viona Gilmore, [facility]

## 2021-05-24 ENCOUNTER — Other Ambulatory Visit: Payer: Self-pay | Admitting: Family Medicine

## 2021-06-08 ENCOUNTER — Other Ambulatory Visit: Payer: Self-pay

## 2021-06-08 ENCOUNTER — Other Ambulatory Visit: Payer: Self-pay | Admitting: Internal Medicine

## 2021-06-08 ENCOUNTER — Telehealth (HOSPITAL_BASED_OUTPATIENT_CLINIC_OR_DEPARTMENT_OTHER): Payer: Medicare Other | Admitting: Psychiatry

## 2021-06-08 DIAGNOSIS — F325 Major depressive disorder, single episode, in full remission: Secondary | ICD-10-CM | POA: Diagnosis not present

## 2021-06-08 MED ORDER — DONEPEZIL HCL 23 MG PO TABS: ORAL_TABLET | ORAL | 5 refills | Status: DC

## 2021-06-08 MED ORDER — BUPROPION HCL ER (XL) 150 MG PO TB24: 150.0000 mg | ORAL_TABLET | ORAL | 6 refills | Status: DC

## 2021-06-08 MED ORDER — TEMAZEPAM 30 MG PO CAPS: 30.0000 mg | ORAL_CAPSULE | Freq: Every day | ORAL | 5 refills | Status: DC

## 2021-06-08 NOTE — Progress Notes (Signed)
Psychiatric Initial Adult Assessment  ? ?Patient Identification: Destiny Hughes ?MRN:  [id number] ?Date of Evaluation:  06/08/2021 ?Referral Source: [facility] ?Chief Complaint: Major depression remission ? ?Today the patient is at her baseline.  She does feel some chronic dysphoria 3 or 4 days out of the week but this is way that been since the death of her son who died of an aneurysm in 2022/10/26.  The patient's mood has not gotten better but not gotten worse.  She is sleeping fairly well.  She takes Restoril the highest dose 30 mg and when she takes it at about 8 or 9:00 at night she will go to bed at 10 she will sleep to about midnight get up for an hour or so watch TV and then go back to bed and sleep to 4 or 5.  Overall few alcohol or sleep she gets 6 to 8 hours.  Importantly the patient does not take naps during the day and she has no dysfunction during the day from being sleepy.  In essence this sleep pattern has been present for a while not affect her during the day.  Her appetite is reasonably good.  The patient stays busy she watches television and plays games and does puzzles.  She cognitively seems to be fairly well.  She does all her basic ADLs.  I do not think she is a candidate to get Namenda.  At this time she takes the highest dose of Aricept 23 mg a day.  The patient has no psychotic symptomatology.  She drinks no alcohol and uses no drugs.  Generally she is stable.  Her husband [name] takes pretty good care of her.  Overall I believe she is stable. ?Virtual Visit via Telephone Note ? ?I connected with Destiny Hughes on 06/08/21 at  1:30 PM EDT by telephone and verified that I am speaking with the correct person using two identifiers. ? ?Location: ?Patient: home ?Provider: office ?  ?I discussed the limitations, risks, security and privacy concerns of performing an evaluation and management service by telephone and the availability of in person appointments. I also discussed with  the patient that there may be a patient responsible charge related to this service. The patient expressed understanding and agreed to proceed. ? ? ? ?  ?I discussed the assessment and treatment plan with the patient. The patient was provided an opportunity to ask questions and all were answered. The patient agreed with the plan and demonstrated an understanding of the instructions. ?  ?The patient was advised to call back or seek an in-person evaluation if the symptoms worsen or if the condition fails to improve as anticipated. ? ?I provided 30 minutes of non-face-to-face time during this encounter. ? ? ?Jerral Ralph, MD  ?(Hypo) Manic Symptoms:   ?Anxiety Symptoms:   ?Psychotic Symptoms:   ?PTSD Symptoms: ? ? ?Past Psychiatric History: none ? ?Previous Psychotropic Medications: Yes  ? ?Substance Abuse History in the last 12 months:  No. ? ?Consequences of Substance Abuse: ? ? ?Past Medical History:  ?Past Medical History:  ?Diagnosis Date  ? Arthritis   ? OA- knees & back  neck  ? Chronic back pain   ? Cognitive retention disorder   ? Collapse of right lung   ? COPD (chronic obstructive pulmonary disease) ([place])   ? Hx. Bronchitis occ, 01-25-11 somes issue now taking  Z-pak-started 01-24-11/Scar tissue  present  from previous lung collapse  ? Dementia with psychosis   ?  Depression   ? Depression   ? Diabetes mellitus ([place]) 07/08/2020  ? Dyspnea   ? Dysrhythmia 2018  ? treated with Ablation   ? GERD (gastroesophageal reflux disease)   ? tx. TUMS  ? Glaucoma   ? Headache   ? History of kidney stones   ? surgically treated  ? Hyperlipemia   ? Hypertension   ? Hypothyroidism   ? tx. Levothyroxine  ? Major depression with psychotic features ([place])   ? Nephrolithiasis   ? Neuromuscular disorder ([place]) 01-25-11  ? Pain/nerve stimulator implanted -2 yrs ago.  ? Reflex, gag, absent   ? Reflex, gag, absent   ?  ?Past Surgical History:  ?Procedure Laterality Date  ? A-FLUTTER ABLATION N/A 08/16/2016  ? Procedure: A-Flutter  Ablation;  Surgeon: Evans Lance, MD;  Location: [facility];  Service: Cardiovascular;  Laterality: N/A;  ? ANTERIOR CERVICAL DECOMP/DISCECTOMY FUSION    ? 3 levels, per family  ? APPENDECTOMY  01-25-11  ? CARPAL TUNNEL RELEASE  01-25-11  ? Bil.  ? CERVICAL SPINE SURGERY  01-25-11  ? 2005-multiple levels  ? CHOLECYSTECTOMY  01-25-11  ? '07  ? COLONOSCOPY WITH PROPOFOL N/A 07/23/2012  ? Procedure: COLONOSCOPY WITH PROPOFOL;  Surgeon: Garlan Fair, MD;  Location: [facility];  Service: Endoscopy;  Laterality: N/A;  ? detatched retna Bilateral   ? done July & August- 2019  ? EYE SURGERY Bilateral 2019  ? repair of retina detachment- Dr. Zadie Rhine at surgical center   ? JOINT REPLACEMENT  01-25-11  ? 6'03 LTHA-hemi  ? KNEE ARTHROPLASTY  01-25-11  ? '04-right, revised x2  ? no gag reflex    ? Pt does not havea gag reflex following  siurgery to neck. Family unsure of date. Pt takes pill in apple sauce.  ? SPINAL CORD STIMULATOR BATTERY EXCHANGE N/A 01/12/2018  ? Procedure: Spinal cord stimulator battery replacement;  Surgeon: Clydell Hakim, MD;  Location: [facility];  Service: Neurosurgery;  Laterality: N/A;  left  ? SPINAL CORD STIMULATOR IMPLANT  2009 APPROX  ? DR. Ellene Route  ? SPINAL CORD STIMULATOR REMOVAL N/A 06/04/2018  ? Procedure: Removal of spinal cord stimulator, thoracic paddle and generator;  Surgeon: Kristeen Miss, MD;  Location: [facility];  Service: Neurosurgery;  Laterality: N/A;  ? THORACIC SPINE SURGERY  01-25-11  ? 6'02- then nerve stimulator implanted after  ? TOTAL HIP ARTHROPLASTY Right 07/29/2020  ? Procedure: RIGHT TOTAL HIP ARTHROPLASTY ANTERIOR APPROACH;  Surgeon: Marybelle Killings, MD;  Location: [facility];  Service: Orthopedics;  Laterality: Right;  ? TOTAL KNEE REVISION  02/01/2011  ? Procedure: TOTAL KNEE REVISION;  Surgeon: Mauri Pole;  Location: [facility];  Service: Orthopedics;  Laterality: Right;  ? TUBAL LIGATION    ? ? ?Family Psychiatric History:  ? ?Family History:  ?Family History  ?Problem Relation  Age of Onset  ? Heart attack Father   ? Emphysema Father   ? Aneurysm Mother   ?     CEREBRAL  ? Emphysema Mother   ? Dementia Mother   ? Diabetes Son   ? Hypertension Son   ? Hypertension Son   ? Cancer Sister   ?     Widely Metastatic  ? Asthma Son   ?     x2  ? Breast cancer Paternal Aunt   ? Rheum arthritis Maternal Aunt   ? ? ?Social History:   ?Social History  ? ?Socioeconomic History  ? Marital status: Married  ?  Spouse name: Not on file  ? Number of children: 2  ? Years of education: Not on file  ? Highest education level: Not on file  ?Occupational History  ? Occupation: disabled  ?Tobacco Use  ? Smoking status: Former  ?  Packs/day: 0.75  ?  Years: 50.00  ?  Pack years: 37.50  ?  Types: Cigarettes  ?  Start date: 09/13/1961  ?  Quit date: 05/2016  ?  Years since quitting: 5.0  ? Smokeless tobacco: Never  ? Tobacco comments:  ?  Decreased Down to 2/3 a Day if [place] It  ?Vaping Use  ? Vaping Use: Never used  ?Substance and Sexual Activity  ? Alcohol use: No  ?  Alcohol/week: 0.0 standard drinks  ?  Comment: none in 10 years  ? Drug use: No  ? Sexual activity: Not Currently  ?Other Topics Concern  ? Not on file  ?Social History Narrative  ? She is from [name]. She has always lived in [place]. She has traveled to [place], [place], [place], [place], & [place]. 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 Prior exposure to parakeets with last exposure remote in her current home. Previously had mold in her home that was in her bathroom & fixed. Prior exposure to hot tubs but none recently. Pt lives at home with [name], husband.  Has 2 childrean, 12th grade education.    ? ?Social Determinants of Health  ? ?Financial Resource Strain: Low Risk   ? Difficulty of Paying Living Expenses: Not very hard  ?Food Insecurity: No Food Insecurity  ? Worried About Charity fundraiser in the Last Year: Never true  ? Ran Out of Food  in the Last Year: Never true  ?Transportation Needs: No Transportation Needs  ? Lack of Transportation (Medical): No  ? Lack of Transportation (Non-Medical): No  ?Physical Activity: Inactive  ? Days of Exerc

## 2021-06-16 ENCOUNTER — Ambulatory Visit (INDEPENDENT_AMBULATORY_CARE_PROVIDER_SITE_OTHER): Payer: Medicare Other | Admitting: Emergency Medicine

## 2021-06-16 ENCOUNTER — Encounter: Payer: Self-pay | Admitting: Emergency Medicine

## 2021-06-16 ENCOUNTER — Other Ambulatory Visit: Payer: Self-pay

## 2021-06-16 DIAGNOSIS — J310 Chronic rhinitis: Secondary | ICD-10-CM | POA: Diagnosis not present

## 2021-06-16 DIAGNOSIS — J449 Chronic obstructive pulmonary disease, unspecified: Secondary | ICD-10-CM

## 2021-06-16 MED ORDER — STIOLTO RESPIMAT 2.5-2.5 MCG/ACT IN AERS: 2.0000 | INHALATION_SPRAY | Freq: Every day | RESPIRATORY_TRACT | 0 refills | Status: DC

## 2021-06-16 MED ORDER — PREDNISONE 10 MG PO TABS: 20.0000 mg | ORAL_TABLET | Freq: Every day | ORAL | 0 refills | Status: DC

## 2021-06-16 MED ORDER — AZITHROMYCIN 250 MG PO TABS: ORAL_TABLET | ORAL | 0 refills | Status: DC

## 2021-06-16 NOTE — Assessment & Plan Note (Signed)
.    No overt wheezing on exam.  She does have signs and symptoms of nasal sinus flaring.  Plan to continue her current bronchodilator regimen ?

## 2021-06-16 NOTE — Addendum Note (Signed)
Addended by: Gavin Potters R on: 06/16/2021 10:32 AM ? ? Modules accepted: Orders ? ?

## 2021-06-16 NOTE — Progress Notes (Signed)
? ?  Subjective:  ? ? Patient ID: Destiny Hughes, female    DOB: 1945-05-17, 76 y.o.   MRN: [id number] ? ?HPI ? ?[name] 01/22/21 --Destiny Hughes is 46.  She has a history of severe emphysematous COPD.  Also with dementia, GERD, depression, A. fib, chronic pain.  She has been managed on Stiolto although not always able to get it reliably.  She has chronic rhinitis, has been using over-the-counter Afrin.  Last visit I asked her to try to start fluticasone nasal spray, ipratropium nasal spray as needed. Her activity is quite limited. She is using albuterol 1-2x a day.  She is interested in getting albuterol nebs to add on to her existing regimen. No flares, no pred. Flu shot up to date. Due for the COVID booster.  ? ?[name] 06/16/21 --follow-up visit 77 year old woman with severe emphysematous COPD, dementia, GERD, depression, A-fib, chronic pain.  She has significant allergic rhinitis, chronic rhinitis. ?Managed on Stiolto, albuterol but she is not taking as directed > takes the stiolto at least once, sometimes twice as a rescue.  ?Remains on lisinopril.  Stopped fluticasone nasal spray, Atrovent nasal spray.  Protonix daily ?She has been having increased nasal congestion, drainage, cough for about 1 week.  ? ? ?Review of Systems ?As above ? ?   ?Objective:  ? Physical Exam ?Vitals:  ? 06/16/21 0926  ?BP: 124/78  ?Pulse: (!) 57  ?Temp: (!) 97.5 ?F (36.4 ?C)  ?TempSrc: Oral  ?SpO2: 95%  ?Weight: 184 lb 6.4 oz (83.6 kg)  ?Height: '5\' 6"'$  (1.676 m)  ? ?Gen: Pleasant, well-nourished, in no distress ? ?ENT: No lesions,  mouth clear,  oropharynx clear, no postnasal drip ? ?Neck: No JVD, no stridor ? ?Lungs: No use of accessory muscles, decreased bilaterally, no wheezing, no crackles ? ?Cardiovascular: RRR, heart sounds normal, no murmur or gallops ? ?Musculoskeletal: No deformities, no cyanosis or clubbing ? ?Neuro: alert  Moves all extremities, no apparent focal deficits ? ?Skin: Warm, no lesions or rash ? ?   ?Assessment & Plan:   ?Chronic rhinitis ?Flaring allergy symptoms.  She is not on any of her nose sprays and we will restart these.  I do not hear any wheezing on exam I think this is mostly nasal sinus.  Treat with brief prednisone 20 mg daily for 5 days, azithromycin ? ?COPD (chronic obstructive pulmonary disease) ([place]) ?Marland Kitchen  No overt wheezing on exam.  She does have signs and symptoms of nasal sinus flaring.  Plan to continue her current bronchodilator regimen ? ? ? ? ?Baltazar Apo, MD, PhD ?06/16/2021, 9:46 AM ?[facility] ?[phone] or if no answer before 7:00PM call [phone] ?For any issues after 7:00PM please call eLink [phone] ? ? ?

## 2021-06-16 NOTE — Patient Instructions (Addendum)
Please continue Stiolto 2 puffs once daily. ?You can use albuterol either 2 puffs or 1 nebulizer treatment up to every 4 hours if needed for shortness of breath, chest tightness, wheezing. ?Take prednisone 20 mg daily for 5 days and then stop. ?Take azithromycin as directed until completely gone. ?Restart your fluticasone (Flonase) nasal spray, 2 sprays each nostril once daily every day to prevent drainage ?Restart your ipratropium (Atrovent) nasal spray.  You can use 2 sprays each nostril up to 2-3 times daily to stop drainage. ?Continue Benadryl once daily ?Follow with Dr [name] in 6 months or sooner if you have any problems ? ?

## 2021-06-16 NOTE — Assessment & Plan Note (Signed)
Flaring allergy symptoms.  She is not on any of her nose sprays and we will restart these.  I do not hear any wheezing on exam I think this is mostly nasal sinus.  Treat with brief prednisone 20 mg daily for 5 days, azithromycin ?

## 2021-07-01 ENCOUNTER — Other Ambulatory Visit: Payer: Self-pay | Admitting: Family Medicine

## 2021-07-01 DIAGNOSIS — E1169 Type 2 diabetes mellitus with other specified complication: Secondary | ICD-10-CM

## 2021-07-01 DIAGNOSIS — G8929 Other chronic pain: Secondary | ICD-10-CM

## 2021-07-07 ENCOUNTER — Other Ambulatory Visit: Payer: Self-pay | Admitting: Family Medicine

## 2021-07-07 DIAGNOSIS — E782 Mixed hyperlipidemia: Secondary | ICD-10-CM

## 2021-07-22 ENCOUNTER — Other Ambulatory Visit: Payer: Self-pay | Admitting: Internal Medicine

## 2021-07-29 ENCOUNTER — Other Ambulatory Visit: Payer: Self-pay | Admitting: Family Medicine

## 2021-08-03 ENCOUNTER — Telehealth: Payer: Self-pay | Admitting: Pharmacist

## 2021-08-03 ENCOUNTER — Telehealth: Payer: Self-pay | Admitting: Emergency Medicine

## 2021-08-03 NOTE — Chronic Care Management (AMB) (Signed)
Chronic Care Management Pharmacy Assistant   Name: Destiny Hughes  MRN: [id number] DOB: 07-13-1945  Reason for Encounter: Disease State / Hypertension Assessment Call   Conditions to be addressed/monitored: HTN  Recent office visits:  None  Recent consult visits:  06/16/2021 Baltazar Apo MD (pulmonary) - Patient was seen for chronic rhinitis and an additional issue. Started Zpac and Prednisone 20 mg daily X 5 days. Restart your fluticasone (Flonase) nasal spray, 2 sprays each nostril once daily every day to prevent drainage Restart your ipratropium (Atrovent) nasal spray.  You can use 2 sprays each nostril up to 2-3 times daily to stop drainage. Follow up in 6 months.   06/08/2021 Norma Fredrickson MD (psychiatry) - Patient was seen for Major depression in remission. No medication changes. Follow up in 3 months.   Hospital visits:  None  Medications: Outpatient Encounter Medications as of 08/03/2021  Medication Sig   acetaminophen (TYLENOL) 500 MG tablet Take 1,000 mg by mouth every 6 (six) hours as needed for moderate pain.   albuterol (PROVENTIL) (2.5 MG/3ML) 0.083% nebulizer solution Take 3 mLs (2.5 mg total) by nebulization every 6 (six) hours as needed for wheezing or shortness of breath.   albuterol (VENTOLIN HFA) 108 (90 Base) MCG/ACT inhaler INHALE 2 PUFFS INTO THE LUNGS EVERY 6 HOURS AS NEEDED FOR WHEEZING OR SHORTNESS OF BREATH   amLODipine (NORVASC) 5 MG tablet Take 1 tablet (5 mg total) by mouth daily.   azithromycin (ZITHROMAX Z-PAK) 250 MG tablet Take 2 tabs today, then 1 tab until gone   buPROPion (WELLBUTRIN XL) 150 MG 24 hr tablet Take 1 tablet (150 mg total) by mouth every morning. 3  qam   cyclobenzaprine (FLEXERIL) 5 MG tablet TAKE ONE-HALF TABLET DAILY AS NEEDED FOR MUSCLE SPASM   diphenhydrAMINE (BENADRYL) 25 MG tablet Take 25 mg by mouth in the morning.   donepezil (ARICEPT) 23 MG TABS tablet 1  qhs   fluticasone (FLONASE) 50 MCG/ACT nasal spray Place 2  sprays into both nostrils daily.   gabapentin (NEURONTIN) 100 MG capsule TAKE ONE CAPSULE TWICE A DAY   ipratropium (ATROVENT) 0.03 % nasal spray PLACE 2 SPRAYS INTO BOTH NOSTRILS EVERY 12 HOURS   levothyroxine (SYNTHROID) 125 MCG tablet TAKE ONE TABLET AT BEDTIME   lisinopril (ZESTRIL) 5 MG tablet TAKE ONE TABLET BY MOUTH ONCE DAILY   melatonin 5 MG TABS Take 5 mg by mouth at bedtime.   metFORMIN (GLUCOPHAGE) 500 MG tablet TAKE ONE TABLET TWICE DAILY WITH A MEAL   pantoprazole (PROTONIX) 40 MG tablet TAKE ONE TABLET DAILY   predniSONE (DELTASONE) 10 MG tablet Take 2 tablets (20 mg total) by mouth daily with breakfast.   propranolol (INDERAL) 80 MG tablet TAKE ONE-HALF TABLET 3 TIMES DAILY   rosuvastatin (CRESTOR) 5 MG tablet TAKE ONE TABLET EACH DAY   sodium chloride 1 g tablet TAKE ONE TABLET THREE TIMES DAILY   temazepam (RESTORIL) 30 MG capsule Take 1 capsule (30 mg total) by mouth at bedtime.   Tiotropium Bromide-Olodaterol (STIOLTO RESPIMAT) 2.5-2.5 MCG/ACT AERS Inhale 2 puffs into the lungs daily.   Tiotropium Bromide-Olodaterol (STIOLTO RESPIMAT) 2.5-2.5 MCG/ACT AERS Inhale 2 puffs into the lungs daily.   No facility-administered encounter medications on file as of 08/03/2021.  Fill History: ALBUTEROL SULFATE HFA 108 (90 BASE) MCG/ 02/02/2021 30   cyclobenzaprine 5 mg tablet 07/01/2021 60   donepezil 23 mg tablet 07/15/2021 30   gabapentin 100 mg capsule 06/24/2021 90   levothyroxine 125 mcg  tablet 07/08/2021 90   lisinopril 5 mg tablet 07/03/2021 30   pantoprazole 40 mg tablet,delayed release 07/29/2021 90   propranolol 80 mg tablet 07/22/2021 90   rosuvastatin 5 mg tablet 07/08/2021 90   SODIUM CHLORIDE 1 GM TAB 03/24/2021 60   PREVIDENT 5000 DRY MOUTH 1.1% GEL ML 05/13/2021 30   temazepam 30 mg capsule 07/22/2021 30   amlodipine 5 mg tablet 05/24/2021 90   BUPROPN HCL '150MG'$  XL TAB 08/01/2021 30    metformin 500 mg tablet 07/01/2021 45   Reviewed chart prior  to disease state call. Spoke with patient regarding BP  Recent Office Vitals: BP Readings from Last 3 Encounters:  06/16/21 124/78  04/28/21 (!) 148/80  03/08/21 (!) 142/76   Pulse Readings from Last 3 Encounters:  06/16/21 (!) 57  03/08/21 64  01/22/21 70    Wt Readings from Last 3 Encounters:  06/16/21 184 lb 6.4 oz (83.6 kg)  03/01/21 182 lb (82.6 kg)  01/22/21 187 lb (84.8 kg)     Kidney Function Lab Results  Component Value Date/Time   CREATININE 0.92 07/30/2020 02:34 AM   CREATININE 0.99 07/27/2020 03:12 PM   CREATININE 1.05 (H) 03/02/2020 09:00 AM   CREATININE 1.11 (H) 10/04/2019 11:16 AM   GFR 46.89 (L) 02/21/2019 02:41 PM   GFRNONAA >60 07/30/2020 02:34 AM   GFRNONAA 52 (L) 03/02/2020 09:00 AM   GFRAA 60 03/02/2020 09:00 AM       Latest Ref Rng & Units 07/30/2020    2:34 AM 07/27/2020    3:12 PM 03/02/2020    9:00 AM  BMP  Glucose 70 - 99 mg/dL 238   140   175    BUN 8 - 23 mg/dL '8   17   14    '$ Creatinine 0.44 - 1.00 mg/dL 0.92   0.99   1.05    BUN/Creat Ratio 6 - 22 (calc)   13    Sodium 135 - 145 mmol/L 129   131   130    Potassium 3.5 - 5.1 mmol/L 4.2   4.4   4.9    Chloride 98 - 111 mmol/L 90   95   91    CO2 22 - 32 mmol/L 32   28   28    Calcium 8.9 - 10.3 mg/dL 8.6   9.0   9.7      Current antihypertensive regimen:  Amlodipine 5 mg daily Lisinopril 5 mg daily Propranolol 80 mg 1/2 tablet 3 times daily  How often are you checking your Blood Pressure? Patients husband states he is checking her blood pressure 1-2 times per week  Current home BP readings: Husband doesn't recall the actual readings but states they are running around 130/80  What recent interventions/DTPs have been made by any provider to improve Blood Pressure control since last CPP Visit: No recent interventions.   Any recent hospitalizations or ED visits since last visit with CPP? No recent hospital visits.   What diet changes have been made to improve Blood Pressure Control?   Patient follows no specific diet Breakfast - patient will have oatmeal, eggs and bacon  Lunch - patient will have hamburger, salad or sandwich Dinner - patient doesn't have dinner Snack - patient will have a light snack for dinner, yogurt or fruit  What exercise is being done to improve your Blood Pressure Control?  Patient does walk around the house often.   Adherence Review: Is the patient currently on ACE/ARB medication? Yes  Does the patient have >5 day gap between last estimated fill dates? No  Care Gaps: AWV - completed 03/01/2021 Last BP - 124/78 on 06/16/2021 Last A1C - 7.5 on 07/08/2020 Foot exam - never done Eye exam - never done Shingrix - never done Dexa scan - never done Covid booster - overdue Tdap - overdue HGA1C  -overdue  Star Rating Drugs: Rosuvastatin 5 mg  - last filled 07/08/2021 90 DS at [facility] Metformin 500 mg - last filled 07/01/2021 45 DS at [facility] - last filled 07/03/2021 30 DS at [facility] Pharmacist Assistant [phone]

## 2021-08-04 NOTE — Telephone Encounter (Signed)
Called husband back and he states that Dr [name] was getting the inhalers some where for his wife but is not sure where.  ? ?Dr [name] do you know what this gentleman is talking about. Its for the [facility] inhaler.  ? ?Dr [name] please advise  ?

## 2021-08-06 NOTE — Telephone Encounter (Signed)
I presume he might be referring to financial assistance from the company.  The only other place we would be able to provide medication will be through samples.  Please check with our clinical pharmacist regarding options for helping them defray cost.  If they need a refill in the interim then go ahead and call it in.

## 2021-08-09 NOTE — Telephone Encounter (Signed)
Attempted to call pt's spouse but unable to reach and unable to leave VM as mailbox has not been set up yet. Will try to call back later.

## 2021-08-10 ENCOUNTER — Telehealth: Payer: Self-pay | Admitting: Pharmacist

## 2021-08-10 NOTE — Chronic Care Management (AMB) (Signed)
Chronic Care Management Pharmacy Assistant   Name: Destiny Hughes  MRN: [id number] DOB: [birth year]  Reason for Encounter: Disease State / Hypertension Assessment Call   Conditions to be addressed/monitored: HTN  Recent office visits:  None  Recent consult visits:  06/16/2021 Baltazar Apo MD (pulmonary disease) - Patient was seen for chronic rhinitis and an additional issue. Started Zpac, Prednisone 20 mg daily. Follow up in 6 months.   06/08/2021 Norma Fredrickson MD(psychiatry) - Patient was seen for Major depression in remission. No medication changes. No follow up noted.   Hospital visits:  None  Medications: Outpatient Encounter Medications as of 08/10/2021  Medication Sig   acetaminophen (TYLENOL) 500 MG tablet Take 1,000 mg by mouth every 6 (six) hours as needed for moderate pain.   albuterol (PROVENTIL) (2.5 MG/3ML) 0.083% nebulizer solution Take 3 mLs (2.5 mg total) by nebulization every 6 (six) hours as needed for wheezing or shortness of breath.   albuterol (VENTOLIN HFA) 108 (90 Base) MCG/ACT inhaler INHALE 2 PUFFS INTO THE LUNGS EVERY 6 HOURS AS NEEDED FOR WHEEZING OR SHORTNESS OF BREATH   amLODipine (NORVASC) 5 MG tablet Take 1 tablet (5 mg total) by mouth daily.   azithromycin (ZITHROMAX Z-PAK) 250 MG tablet Take 2 tabs today, then 1 tab until gone   buPROPion (WELLBUTRIN XL) 150 MG 24 hr tablet Take 1 tablet (150 mg total) by mouth every morning. 3  qam   cyclobenzaprine (FLEXERIL) 5 MG tablet TAKE ONE-HALF TABLET DAILY AS NEEDED FOR MUSCLE SPASM   diphenhydrAMINE (BENADRYL) 25 MG tablet Take 25 mg by mouth in the morning.   donepezil (ARICEPT) 23 MG TABS tablet 1  qhs   fluticasone (FLONASE) 50 MCG/ACT nasal spray Place 2 sprays into both nostrils daily.   gabapentin (NEURONTIN) 100 MG capsule TAKE ONE CAPSULE TWICE A DAY   ipratropium (ATROVENT) 0.03 % nasal spray PLACE 2 SPRAYS INTO BOTH NOSTRILS EVERY 12 HOURS   levothyroxine (SYNTHROID) 125 MCG tablet TAKE  ONE TABLET AT BEDTIME   lisinopril (ZESTRIL) 5 MG tablet TAKE ONE TABLET BY MOUTH ONCE DAILY   melatonin 5 MG TABS Take 5 mg by mouth at bedtime.   metFORMIN (GLUCOPHAGE) 500 MG tablet TAKE ONE TABLET TWICE DAILY WITH A MEAL   pantoprazole (PROTONIX) 40 MG tablet TAKE ONE TABLET DAILY   predniSONE (DELTASONE) 10 MG tablet Take 2 tablets (20 mg total) by mouth daily with breakfast.   propranolol (INDERAL) 80 MG tablet TAKE ONE-HALF TABLET 3 TIMES DAILY   rosuvastatin (CRESTOR) 5 MG tablet TAKE ONE TABLET EACH DAY   sodium chloride 1 g tablet TAKE ONE TABLET THREE TIMES DAILY   temazepam (RESTORIL) 30 MG capsule Take 1 capsule (30 mg total) by mouth at bedtime.   Tiotropium Bromide-Olodaterol (STIOLTO RESPIMAT) 2.5-2.5 MCG/ACT AERS Inhale 2 puffs into the lungs daily.   Tiotropium Bromide-Olodaterol (STIOLTO RESPIMAT) 2.5-2.5 MCG/ACT AERS Inhale 2 puffs into the lungs daily.   No facility-administered encounter medications on file as of 08/10/2021.  Fill History: ALBUTEROL SULFATE HFA 108 (90 BASE) MCG/ 02/02/2021 30   cyclobenzaprine 5 mg tablet 07/01/2021 60   donepezil 23 mg tablet 07/15/2021 30   fluticasone propionate 50 mcg/actuation nasal spray,suspension 06/09/2021 30   gabapentin 100 mg capsule 06/24/2021 90   levothyroxine 125 mcg tablet 07/08/2021 90   pantoprazole 40 mg tablet,delayed release 07/29/2021 90   propranolol 80 mg tablet 07/22/2021 90   rosuvastatin 5 mg tablet 07/08/2021 90   temazepam 30 mg capsule  07/22/2021 30   amlodipine 5 mg tablet 05/24/2021 90   bupropion HCl XL 150 mg 24 hr tablet, extended release 08/01/2021 30   metformin 500 mg tablet 07/01/2021 45   Reviewed chart prior to disease state call. Spoke with patient regarding BP  Recent Office Vitals: BP Readings from Last 3 Encounters:  06/16/21 124/78  04/28/21 (!) 148/80  03/08/21 (!) 142/76   Pulse Readings from Last 3 Encounters:  06/16/21 (!) 57  03/08/21 64  01/22/21 70    Wt  Readings from Last 3 Encounters:  06/16/21 184 lb 6.4 oz (83.6 kg)  03/01/21 182 lb (82.6 kg)  01/22/21 187 lb (84.8 kg)     Kidney Function Lab Results  Component Value Date/Time   CREATININE 0.92 07/30/2020 02:34 AM   CREATININE 0.99 07/27/2020 03:12 PM   CREATININE 1.05 (H) 03/02/2020 09:00 AM   CREATININE 1.11 (H) 10/04/2019 11:16 AM   GFR 46.89 (L) 02/21/2019 02:41 PM   GFRNONAA >60 07/30/2020 02:34 AM   GFRNONAA 52 (L) 03/02/2020 09:00 AM   GFRAA 60 03/02/2020 09:00 AM       Latest Ref Rng & Units 07/30/2020    2:34 AM 07/27/2020    3:12 PM 03/02/2020    9:00 AM  BMP  Glucose 70 - 99 mg/dL 238   140   175    BUN 8 - 23 mg/dL '8   17   14    '$ Creatinine 0.44 - 1.00 mg/dL 0.92   0.99   1.05    BUN/Creat Ratio 6 - 22 (calc)   13    Sodium 135 - 145 mmol/L 129   131   130    Potassium 3.5 - 5.1 mmol/L 4.2   4.4   4.9    Chloride 98 - 111 mmol/L 90   95   91    CO2 22 - 32 mmol/L 32   28   28    Calcium 8.9 - 10.3 mg/dL 8.6   9.0   9.7      Current antihypertensive regimen:  Amlodipine 5 mg daily Lisinopril 5 mg daily  How often are you checking your Blood Pressure? Patients husband states he is checking her blood pressure 1-2 times daily  Current home BP readings: Patients husband states he doesn't keep a log and doesn't recall the exact readings but they are always in the normal range around 120/80.  What recent interventions/DTPs have been made by any provider to improve Blood Pressure control since last CPP Visit: No recent interventions.   Any recent hospitalizations or ED visits since last visit with CPP? No recent hospital visits.   What diet changes have been made to improve Blood Pressure Control?  Patient follows no specific diet Breakfast - patient will have oatmeal or ham biscuit Lunch - patient will have a sandwich, hamburger or salad Dinner - patient will have a light meal or snack  What exercise is being done to improve your Blood Pressure Control?   Patients husband states she doesn't get any type of exercise, she is not able to get around well.   Adherence Review: Is the patient currently on ACE/ARB medication? Yes Does the patient have >5 day gap between last estimated fill dates? No  Care Gaps: AWV - completed 03/01/2021 Last BP - 124/78 on 06/16/2021 Last A1C - 7.5 on 07/08/2020 Foot exam - never done Eye exam - never done Shingrix - never done Dexa scan - never done Covid booster -  overdue Tdap - overdue HGBA1C - overdue  Star Rating Drugs: Lisinopril 5 mg - last filled 07/03/2021 30 DS at [facility] Rosuvastatin 5 mg  - last filled 07/08/2021 90 DS at [facility] Metformin 500 mg - last filled 07/01/2021 45 DS at [facility] Pharmacist Assistant [phone]

## 2021-08-11 MED ORDER — STIOLTO RESPIMAT 2.5-2.5 MCG/ACT IN AERS: 2.0000 | INHALATION_SPRAY | Freq: Every day | RESPIRATORY_TRACT | 6 refills | Status: DC

## 2021-08-11 NOTE — Telephone Encounter (Signed)
Called and spoke with patient's husband (DPR), he states that our office has been providing samples to them for years because it was not affordable at the pharmacy.  I advised him that we cannot continue to provide samples, we usually give samples to see if the medication is effective and then send a script to the pharmacy.  I let him know if it is not affordable he can call the insurance company and see what is on the preferred list.  He stated he did not have time to do all that, could he just give her the liquid medication?  I asked him if she was able to inhale the medication out of the inhaler and he stated yes.  I let him know that I could send the script to see if it is covered.  He asked why I could not just call and find out for him since it is hard to get through to the office.  I asked if they use mychart and he said his computer was broken and he was almost 76 years old and did not have the time or the patients to mess with all that.  I told him we cannot call everyone's pharmacy to find out if it is covered.  He said I should just call because he was just the middle man.  I told him I would send in the script and then call to see if it is covered.  He ended the call very abruptly.  Called pharmacy after script was sent and I called to see if it was covered.  The pharmacy ran the claim and it was $250 copay.  The pharmacist states she is likely is in the coverage gap.  He said it may only cover something like Advair that has a generic.  Dr. [name], Please advise if you want to try an inhaler that has a generic form.  Thank you.

## 2021-08-11 NOTE — Telephone Encounter (Signed)
I would be ok modifying her albuterol neb script for her to use it 4x a day on a schedule - if she is nopt going to be able to fill the Stiolto.

## 2021-08-11 NOTE — Telephone Encounter (Signed)
Called and spoke with patient's husband. He verbalized understanding. He stated that the patient was getting the Stiolto for free in the mail. I asked him if it was patient assistance and he stated yes. I explained to him that we could try to get qualified again for the patient assistance. He wishes to proceed. I advised him that I would go ahead and complete an application and place it in the mail for them to sign. I confirmed the address on file. He is aware that I will highlight the sections that need to be completed.   Nothing further needed at time of call.

## 2021-08-17 ENCOUNTER — Telehealth: Payer: Self-pay | Admitting: Emergency Medicine

## 2021-08-24 ENCOUNTER — Other Ambulatory Visit: Payer: Self-pay | Admitting: Family Medicine

## 2021-08-24 ENCOUNTER — Other Ambulatory Visit: Payer: Self-pay

## 2021-08-24 MED ORDER — STIOLTO RESPIMAT 2.5-2.5 MCG/ACT IN AERS: 2.0000 | INHALATION_SPRAY | Freq: Every day | RESPIRATORY_TRACT | 3 refills | Status: DC

## 2021-08-25 ENCOUNTER — Other Ambulatory Visit: Payer: Self-pay | Admitting: Family Medicine

## 2021-08-26 NOTE — Telephone Encounter (Signed)
Faxed signed application and Rx to [facility]. Updated pt's husband (per DPR).

## 2021-08-30 ENCOUNTER — Other Ambulatory Visit: Payer: Self-pay | Admitting: Family Medicine

## 2021-08-30 DIAGNOSIS — G8929 Other chronic pain: Secondary | ICD-10-CM

## 2021-09-06 ENCOUNTER — Other Ambulatory Visit: Payer: Self-pay | Admitting: Internal Medicine

## 2021-09-14 ENCOUNTER — Ambulatory Visit (HOSPITAL_BASED_OUTPATIENT_CLINIC_OR_DEPARTMENT_OTHER): Payer: Medicare Other | Admitting: Psychiatry

## 2021-09-14 DIAGNOSIS — F3342 Major depressive disorder, recurrent, in full remission: Secondary | ICD-10-CM

## 2021-09-14 MED ORDER — TEMAZEPAM 15 MG PO CAPS: 30.0000 mg | ORAL_CAPSULE | Freq: Every day | ORAL | 5 refills | Status: DC

## 2021-09-14 MED ORDER — DONEPEZIL HCL 23 MG PO TABS: ORAL_TABLET | ORAL | 5 refills | Status: DC

## 2021-09-14 MED ORDER — BUPROPION HCL ER (XL) 150 MG PO TB24: 150.0000 mg | ORAL_TABLET | ORAL | 6 refills | Status: DC

## 2021-09-17 ENCOUNTER — Ambulatory Visit: Payer: Medicare Other | Admitting: Internal Medicine

## 2021-09-22 ENCOUNTER — Other Ambulatory Visit: Payer: Self-pay | Admitting: Family Medicine

## 2021-09-26 NOTE — Progress Notes (Deleted)
Cardiology Office Note Date:  09/26/2021  Patient ID:  Destiny Hughes, DOB 08-22-1945, MRN [id number] PCP:  Billie Ruddy, MD  Electrophysiologist: Dr. Lovena Le  ***refresh   Chief Complaint: *** annual visit  History of Present Illness: Destiny Hughes is a 76 y.o. female with history of  SVT/AFlutter s/p ablation 07/2016, COPD (Dr. [name]), chronic back pain, depression, GERD, HLD, HTN, depression   She comes in today to be seen for Dr .Lovena Le, last seen by him 05/12/21. She was doing well, no symptoms of arrhythmia, BP controlled, no chanes were made.  *** tachycardias? *** symptoms *** labs, lipids...     Past Medical History:  Diagnosis Date   Arthritis    OA- knees & back  neck   Chronic back pain    Cognitive retention disorder    Collapse of right lung    COPD (chronic obstructive pulmonary disease) (HCC)    Hx. Bronchitis occ, 01-25-11 somes issue now taking  Z-pak-started 01-24-11/Scar tissue  present  from previous lung collapse   Dementia with psychosis    Depression    Depression    Diabetes mellitus ([place]) 07/08/2020   Dyspnea    Dysrhythmia 2018   treated with Ablation    GERD (gastroesophageal reflux disease)    tx. TUMS   Glaucoma    Headache    History of kidney stones    surgically treated   Hyperlipemia    Hypertension    Hypothyroidism    tx. Levothyroxine   Major depression with psychotic features ([place])    Nephrolithiasis    Neuromuscular disorder ([place]) 01-25-11   Pain/nerve stimulator implanted -2 yrs ago.   Reflex, gag, absent    Reflex, gag, absent     Past Surgical History:  Procedure Laterality Date   A-FLUTTER ABLATION N/A 08/16/2016   Procedure: A-Flutter Ablation;  Surgeon: Evans Lance, MD;  Location: [facility];  Service: Cardiovascular;  Laterality: N/A;   ANTERIOR CERVICAL DECOMP/DISCECTOMY FUSION     3 levels, per family   APPENDECTOMY  01-25-11   CARPAL TUNNEL RELEASE  01-25-11   Bil.   CERVICAL SPINE  SURGERY  01-25-11   2005-multiple levels   CHOLECYSTECTOMY  01-25-11   '07   COLONOSCOPY WITH PROPOFOL N/A 07/23/2012   Procedure: COLONOSCOPY WITH PROPOFOL;  Surgeon: Garlan Fair, MD;  [facility];  Service: Endoscopy;  Laterality: N/A;   detatched retna Bilateral    done July & August- 2019   EYE SURGERY Bilateral 2019   repair of retina detachment- Dr. Zadie Rhine at surgical center    JOINT REPLACEMENT  01-25-11   6'03 [place]  01-25-11   '04-right, revised x2   no gag reflex     Pt does not havea gag reflex following  siurgery to neck. Family unsure of date. Pt takes pill in apple sauce.   SPINAL CORD STIMULATOR BATTERY EXCHANGE N/A 01/12/2018   Procedure: Spinal cord stimulator battery replacement;  Surgeon: Clydell Hakim, MD;  Location: [facility];  Service: Neurosurgery;  Laterality: N/A;  left   SPINAL CORD STIMULATOR IMPLANT  2009 APPROX   DR. ELSNER   SPINAL CORD STIMULATOR REMOVAL N/A 06/04/2018   Procedure: Removal of spinal cord stimulator, thoracic paddle and generator;  Surgeon: Kristeen Miss, MD;  Location: [facility];  Service: Neurosurgery;  Laterality: N/A;   THORACIC SPINE SURGERY  01-25-11   6'02- then nerve stimulator implanted after   TOTAL HIP ARTHROPLASTY Right  07/29/2020   Procedure: RIGHT TOTAL HIP ARTHROPLASTY ANTERIOR APPROACH;  Surgeon: Marybelle Killings, MD;  Location: [facility];  Service: Orthopedics;  Laterality: Right;   TOTAL KNEE REVISION  02/01/2011   Procedure: TOTAL KNEE REVISION;  Surgeon: Mauri Pole;  [facility];  Service: Orthopedics;  Laterality: Right;   TUBAL LIGATION      Current Outpatient Medications  Medication Sig Dispense Refill   acetaminophen (TYLENOL) 500 MG tablet Take 1,000 mg by mouth every 6 (six) hours as needed for moderate pain.     albuterol (PROVENTIL) (2.5 MG/3ML) 0.083% nebulizer solution Take 3 mLs (2.5 mg total) by nebulization every 6 (six) hours as needed for wheezing or shortness of breath. 75 mL  12   albuterol (VENTOLIN HFA) 108 (90 Base) MCG/ACT inhaler INHALE 2 PUFFS INTO THE LUNGS EVERY 6 HOURS AS NEEDED FOR WHEEZING OR SHORTNESS OF BREATH 8.5 g 6   amLODipine (NORVASC) 5 MG tablet TAKE ONE TABLET BY MOUTH ONCE DAILY 90 tablet 1   azithromycin (ZITHROMAX Z-PAK) 250 MG tablet Take 2 tabs today, then 1 tab until gone 6 each 0   buPROPion (WELLBUTRIN XL) 150 MG 24 hr tablet Take 1 tablet (150 mg total) by mouth every morning. 3  qam 90 tablet 6   cyclobenzaprine (FLEXERIL) 5 MG tablet TAKE ONE-HALF TABLET DAILY AS NEEDED FOR MUSCLE SPASM 30 tablet 0   diphenhydrAMINE (BENADRYL) 25 MG tablet Take 25 mg by mouth in the morning.     donepezil (ARICEPT) 23 MG TABS tablet 1  qhs 30 tablet 5   fluticasone (FLONASE) 50 MCG/ACT nasal spray Place 2 sprays into both nostrils daily. 16 g 2   gabapentin (NEURONTIN) 100 MG capsule TAKE ONE CAPSULE TWICE A DAY 180 capsule 0   ipratropium (ATROVENT) 0.03 % nasal spray PLACE 2 SPRAYS INTO BOTH NOSTRILS EVERY 12 HOURS 30 mL 2   levothyroxine (SYNTHROID) 125 MCG tablet TAKE ONE TABLET AT BEDTIME 90 tablet 0   lisinopril (ZESTRIL) 5 MG tablet Take 1 tablet (5 mg total) by mouth daily. 30 tablet 0   melatonin 5 MG TABS Take 5 mg by mouth at bedtime.     metFORMIN (GLUCOPHAGE) 500 MG tablet TAKE ONE TABLET TWICE DAILY WITH A MEAL 90 tablet 1   pantoprazole (PROTONIX) 40 MG tablet TAKE ONE TABLET DAILY 90 tablet 0   predniSONE (DELTASONE) 10 MG tablet Take 2 tablets (20 mg total) by mouth daily with breakfast. 10 tablet 0   propranolol (INDERAL) 80 MG tablet TAKE ONE-HALF TABLET 3 TIMES DAILY 135 tablet 0   rosuvastatin (CRESTOR) 5 MG tablet TAKE ONE TABLET EACH DAY 90 tablet 0   sodium chloride 1 g tablet TAKE ONE TABLET THREE TIMES DAILY 180 tablet 1   temazepam (RESTORIL) 15 MG capsule Take 2 capsules (30 mg total) by mouth at bedtime. 30 capsule 5   Tiotropium Bromide-Olodaterol (STIOLTO RESPIMAT) 2.5-2.5 MCG/ACT AERS Inhale 2 puffs into the lungs daily.  4 g 0   Tiotropium Bromide-Olodaterol (STIOLTO RESPIMAT) 2.5-2.5 MCG/ACT AERS Inhale 2 puffs into the lungs daily. 1 each 6   Tiotropium Bromide-Olodaterol (STIOLTO RESPIMAT) 2.5-2.5 MCG/ACT AERS Inhale 2 puffs into the lungs daily. 12 g 3   No current facility-administered medications for this visit.    Allergies:   Albuterol, Penicillins, Sulfa antibiotics, Codeine, Ecotrin [aspirin], and Zanaflex [tizanidine]   Social History:  The patient  reports that she quit smoking about 5 years ago. Her smoking use included cigarettes. She started  smoking about 60 years ago. She has a 37.50 pack-year smoking history. She has never used smokeless tobacco. She reports that she does not drink alcohol and does not use drugs.   Family History:  The patient's family history includes Aneurysm in her mother; Asthma in her son; Breast cancer in her paternal aunt; Cancer in her sister; Dementia in her mother; Diabetes in her son; Emphysema in her father and mother; Heart attack in her father; Hypertension in her son and son; Rheum arthritis in her maternal aunt.  ROS:  Please see the history of present illness.    All other systems are reviewed and otherwise negative.   PHYSICAL EXAM:  VS:  There were no vitals taken for this visit. BMI: There is no height or weight on file to calculate BMI. Well nourished, well developed, in no acute distress HEENT: normocephalic, atraumatic Neck: no JVD, carotid bruits or masses Cardiac:  *** RRR; no significant murmurs, no rubs, or gallops Lungs:  *** CTA b/l, no wheezing, rhonchi or rales Abd: soft, nontender MS: no deformity or *** atrophy Ext: *** no edema Skin: warm and dry, no rash Neuro:  No gross deficits appreciated Psych: euthymic mood, full affect    EKG:  Done today and reviewed by myself shows  ***  05/09/2018: stress myoview Nuclear stress EF: 71%. No T wave inversion was noted during stress. There was no ST segment deviation noted during  stress. The study is normal.   No reversible ischemia. LVEF 71% with normal wall motion. This is a low risk study.  08/16/2016: TTE Study Conclusions  - Aortic valve: No evidence of vegetation.  - Mitral valve: There was mild regurgitation.  - Left atrium: No evidence of thrombus in the atrial cavity or    appendage.  - Tricuspid valve: No evidence of vegetation.   08/16/2016: EPS/Ablation CONCLUSIONS:  1. Isthmus-dependent right atrial flutter upon presentation.  2. Successful radiofrequency ablation of atrial flutter along the cavotricuspid isthmus with complete bidirectional isthmus block achieved.  3. Successful catheter ablation of inducible AVNRT 4. No inducible arrhythmias following ablation.  5. The patient had transient AV block after the ablation which resolved and did not return.     Recent Labs: No results found for requested labs within last 365 days.  No results found for requested labs within last 365 days.   CrCl cannot be calculated (Patient's most recent lab result is older than the maximum 21 days allowed.).   Wt Readings from Last 3 Encounters:  06/16/21 184 lb 6.4 oz (83.6 kg)  03/01/21 182 lb (82.6 kg)  01/22/21 187 lb (84.8 kg)     Other studies reviewed: Additional studies/records reviewed today include: summarized above  ASSESSMENT AND PLAN:  AFlutter SVT S/p CTI and AVNRT ablation ***  HTN ***  Disposition: F/u with ***  Current medicines are reviewed at length with the patient today.  The patient did not have any concerns regarding medicines.  Venetia Night, PA-C 09/26/2021 3:26 PM     [address] [place] [address] [phone] (office)  [phone] (fax)

## 2021-09-28 ENCOUNTER — Ambulatory Visit: Payer: Medicare Other | Admitting: Physician Assistant

## 2021-09-29 ENCOUNTER — Other Ambulatory Visit: Payer: Self-pay | Admitting: Family Medicine

## 2021-09-29 DIAGNOSIS — E1169 Type 2 diabetes mellitus with other specified complication: Secondary | ICD-10-CM

## 2021-09-29 DIAGNOSIS — E782 Mixed hyperlipidemia: Secondary | ICD-10-CM

## 2021-10-08 ENCOUNTER — Other Ambulatory Visit: Payer: Self-pay | Admitting: Family Medicine

## 2021-10-08 ENCOUNTER — Other Ambulatory Visit: Payer: Self-pay | Admitting: Internal Medicine

## 2021-10-12 ENCOUNTER — Ambulatory Visit (INDEPENDENT_AMBULATORY_CARE_PROVIDER_SITE_OTHER): Payer: Medicare Other | Admitting: Family Medicine

## 2021-10-12 ENCOUNTER — Encounter: Payer: Self-pay | Admitting: Family Medicine

## 2021-10-12 VITALS — BP 130/64 | HR 83 | Temp 98.2°F | Wt 190.4 lb

## 2021-10-12 DIAGNOSIS — H6121 Impacted cerumen, right ear: Secondary | ICD-10-CM

## 2021-10-12 DIAGNOSIS — J069 Acute upper respiratory infection, unspecified: Secondary | ICD-10-CM | POA: Diagnosis not present

## 2021-10-12 NOTE — Progress Notes (Signed)
   Subjective:    Patient ID: Destiny Hughes, female    DOB: 06/25/1945, 76 y.o.   MRN: [id number]  HPI Here for 4 days of stuffy head, PND, loss of hearing in the right ear, and a ST. No fever or cough. No ear pain. Taking Benadryl.    Review of Systems  Constitutional: Negative.   HENT:  Positive for congestion, hearing loss, postnasal drip and sore throat. Negative for ear discharge, ear pain and sinus pain.   Eyes: Negative.   Respiratory: Negative.         Objective:   Physical Exam Constitutional:      Appearance: She is not ill-appearing.  HENT:     Right Ear: There is impacted cerumen.     Left Ear: Tympanic membrane, ear canal and external ear normal.     Nose: Nose normal.     Mouth/Throat:     Pharynx: Oropharynx is clear.  Eyes:     Conjunctiva/sclera: Conjunctivae normal.  Pulmonary:     Effort: Pulmonary effort is normal.     Breath sounds: Normal breath sounds.  Lymphadenopathy:     Cervical: No cervical adenopathy.  Neurological:     Mental Status: She is alert.           Assessment & Plan:  She has a viral URI. She can drink fluids and take Mucinex as needed. She also has a cerumen impaction in the right ear canal. After informed consent was obtained, the ear was irrigated clear with water. She tolerated the procedure well, and afterwards she could hear normally again. On repeat exam the canal was clear.  We spent a total of ( 33  ) minutes reviewing records and discussing these issues.  Alysia Penna, MD

## 2021-10-13 ENCOUNTER — Other Ambulatory Visit: Payer: Self-pay | Admitting: Family Medicine

## 2021-10-13 ENCOUNTER — Telehealth: Payer: Self-pay | Admitting: *Deleted

## 2021-10-13 NOTE — Telephone Encounter (Signed)
Ortho bundle 1 year call completed. ?

## 2021-10-15 ENCOUNTER — Telehealth: Payer: Self-pay | Admitting: Emergency Medicine

## 2021-10-15 ENCOUNTER — Other Ambulatory Visit: Payer: Self-pay | Admitting: Emergency Medicine

## 2021-10-15 MED ORDER — STIOLTO RESPIMAT 2.5-2.5 MCG/ACT IN AERS: 2.0000 | INHALATION_SPRAY | Freq: Every day | RESPIRATORY_TRACT | 1 refills | Status: DC

## 2021-10-15 MED ORDER — ALBUTEROL SULFATE HFA 108 (90 BASE) MCG/ACT IN AERS: 2.0000 | INHALATION_SPRAY | Freq: Four times a day (QID) | RESPIRATORY_TRACT | 6 refills | Status: DC | PRN

## 2021-10-15 NOTE — Telephone Encounter (Signed)
I called the patient and per the son she is needing the Stiolto and Albuterol back up inhaler. I have sent in both refills. Nothing further needed.

## 2021-10-23 ENCOUNTER — Other Ambulatory Visit: Payer: Self-pay | Admitting: Family Medicine

## 2021-10-23 DIAGNOSIS — G8929 Other chronic pain: Secondary | ICD-10-CM

## 2021-10-26 ENCOUNTER — Ambulatory Visit (INDEPENDENT_AMBULATORY_CARE_PROVIDER_SITE_OTHER): Payer: Medicare Other | Admitting: Physician Assistant

## 2021-10-26 ENCOUNTER — Ambulatory Visit (INDEPENDENT_AMBULATORY_CARE_PROVIDER_SITE_OTHER): Payer: Medicare Other

## 2021-10-26 DIAGNOSIS — M25551 Pain in right hip: Secondary | ICD-10-CM

## 2021-10-26 MED ORDER — MELOXICAM 7.5 MG PO TABS: 7.5000 mg | ORAL_TABLET | Freq: Every day | ORAL | 0 refills | Status: DC

## 2021-10-26 NOTE — Progress Notes (Signed)
Office Visit Note   Patient: Destiny Hughes           Date of Birth: 12/14/1945           MRN: [id number] Visit Date: 10/26/2021              Requested by: Billie Ruddy, MD [place],  [place] [address] PCP: Billie Ruddy, MD  Chief Complaint  Patient presents with   Right Hip - Pain      HPI: Patient is a pleasant 76 year old woman with a 2-week history of right hip pain.  She is a year and 3 months status post right total hip arthroplasty with Dr. Lorin Mercy.  She denies any history of injury any fever chills or other illness.  2 weeks ago she just began noticing pain at the top of the scar that radiated down into the groin.  She is still able to ambulate with a cane.  She has been using Aspercreme and it seems to help.  Assessment & Plan: Visit Diagnoses:  1. Pain in right hip     Plan: Does not appear to be over her trochanteric bursa.  More proximal and just into the groin.  She actually has good fluid painless movement of the hip.  Aspercreme has helped her.  I suggest she try Voltaren gel.  We will put her on a low-dose of Mobic and have advised her to follow-up with Dr. Lorin Mercy or Jeneen Rinks in the next week or 2.  Obviously if she has any increasing symptoms or fever chills or redness she is to contact us immediately  Follow-Up Instructions: In 1 to 2 weeks with Dr. Dutch Gray Exam  Patient is alert, oriented, no adenopathy, well-dressed, normal affect, normal respiratory effort. Patient is pleasant she is able to stand with her cane.  Examination of her right hip she has a well-healed surgical incision no redness no erythema no tenseness in her thigh no tenderness with the thigh to palpation she has excellent motion that is painless with manipulation of her hip.  She is tender mostly at the proximal scar.  She does have some tenderness over the lateral leg but she focuses on the proximal scar into her groin.  Do not see any evidence of any redness or  abscess  Imaging: No results found. No images are attached to the encounter.  Labs: Lab Results  Component Value Date   HGBA1C 7.5 (A) 07/08/2020   HGBA1C 7.9 (A) 06/22/2020   HGBA1C 9.2 (H) 03/02/2020   REPTSTATUS 07/12/2016 FINAL 07/11/2016   GRAMSTAIN Rare 01/06/2015   GRAMSTAIN WBC present-predominately PMN 01/06/2015   GRAMSTAIN Few Squamous Epithelial Cells Present 01/06/2015   GRAMSTAIN Abundant Gram Positive Cocci In Pairs In Chains 01/06/2015   GRAMSTAIN Rare Gram Negative Rods 01/06/2015   CULT MULTIPLE SPECIES PRESENT, SUGGEST RECOLLECTION (A) 07/11/2016   LABORGA No Acid Fast Bacilli Isolated in 6 Weeks 01/06/2015   LABORGA Normal Oropharyngeal Flora 01/06/2015   LABORGA CANDIDA ALBICANS 01/06/2015     Lab Results  Component Value Date   ALBUMIN 4.0 07/27/2020   ALBUMIN 4.2 01/03/2018   ALBUMIN 4.1 08/16/2017    Lab Results  Component Value Date   MG 1.9 09/22/2016   MG 1.7 09/05/2016   MG 1.5 (L) 07/12/2016   Lab Results  Component Value Date   VD25OH 64 09/01/2015   VD25OH 40 04/28/2015   VD25OH 35 05/21/2014    No results found for: "PREALBUMIN"  Latest Ref Rng & Units 07/31/2020    3:48 AM 07/30/2020    2:34 AM 07/27/2020    3:12 PM  CBC EXTENDED  WBC 4.0 - 10.5 K/uL 8.1  9.8  6.7   RBC 3.87 - 5.11 MIL/uL 3.54  3.79  4.53   Hemoglobin 12.0 - 15.0 g/dL 10.6  11.3  13.5   HCT 36.0 - 46.0 % 31.3  33.7  40.6   Platelets 150 - 400 K/uL 269  321  379      There is no height or weight on file to calculate BMI.  Orders:  Orders Placed This Encounter  Procedures   XR HIP UNILAT W OR W/O PELVIS 2-3 VIEWS RIGHT   Meds ordered this encounter  Medications   meloxicam (MOBIC) 7.5 MG tablet    Sig: Take 1 tablet (7.5 mg total) by mouth daily.    Dispense:  20 tablet    Refill:  0     Procedures: No procedures performed  Clinical Data: No additional findings.  ROS:  All other systems negative, except as noted in the HPI. Review of  Systems  Objective: Vital Signs: There were no vitals taken for this visit.  Specialty Comments:  No specialty comments available.  PMFS History: Patient Active Problem List   Diagnosis Date Noted   Status post total hip replacement, right 07/29/2020   Diabetes mellitus ([place]) 07/08/2020   Unilateral primary osteoarthritis, left knee 03/30/2020   History of fusion of cervical spine 07/03/2019   Spinal cord stimulator dysfunction ([place]) 06/04/2018   Chronic cough 05/21/2018   Tremor 02/16/2018   Chronic rhinitis 02/05/2018   Hallucinations    Psychosis ([place]) 11/07/2016   Acute respiratory failure with hypoxia and hypercapnia (HCC) 11/03/2016   Other chest pain 10/23/2016   Tobacco abuse 10/23/2016   Major depression with psychotic features ([place]) 10/19/2016   Dementia with behavioral disturbance ([place]) 10/19/2016   Depression due to physical illness    Cognitive retention disorder    Atrial flutter ([place]) 08/15/2016   Paroxysmal atrial fibrillation ([place]) 08/15/2016   SVT (supraventricular tachycardia) ([place]) 07/11/2016   Weight loss 07/08/2016   AKI (acute kidney injury) ([place]) 07/02/2016   Acute kidney injury ([place]) 07/01/2016   Hyperglycemia 07/01/2016   Hyponatremia 07/01/2016   Intractable nausea and vomiting 07/01/2016   Abnormal EKG 44/96/7591   Diastolic CHF ([place]) 63/84/6659   Medication management 04/28/2015   Emphysema lung ([place]) 12/26/2014   Gastroesophageal reflux disease without esophagitis 12/26/2014   Chronic bronchitis ([name]) 11/13/2014   COPD (chronic obstructive pulmonary disease) ([place]) 10/03/2014   At high risk for falls 10/03/2014   Other specified hypothyroidism 10/03/2014   Mitral valve prolapse 10/03/2014   Encounter for Medicare annual wellness exam 10/03/2014   IBS (irritable bowel syndrome) 04/01/2014   S/P total knee replacement 04/22/2013   Knee pain, chronic 04/22/2013   Essential hypertension 04/15/2013   Vitamin D deficiency 04/15/2013    Encounter for long-term (current) use of medications 04/15/2013   Prediabetes 04/15/2013   Hyperlipemia    Glaucoma    Chronic pain disorder    Chronic back pain    Past Medical History:  Diagnosis Date   Arthritis    OA- knees & back  neck   Chronic back pain    Cognitive retention disorder    Collapse of right lung    COPD (chronic obstructive pulmonary disease) (HCC)    Hx. Bronchitis occ, 01-25-11 somes issue now taking  Z-pak-started 01-24-11/Scar tissue  present  from previous lung collapse   Dementia with psychosis ([place])    Depression    Depression    Diabetes mellitus ([place]) 07/08/2020   Dyspnea    Dysrhythmia 2018   treated with Ablation    GERD (gastroesophageal reflux disease)    tx. TUMS   Glaucoma    Headache    History of kidney stones    surgically treated   Hyperlipemia    Hypertension    Hypothyroidism    tx. Levothyroxine   Major depression with psychotic features ([place])    Nephrolithiasis    Neuromuscular disorder ([place]) 01-25-11   Pain/nerve stimulator implanted -2 yrs ago.   Reflex, gag, absent    Reflex, gag, absent     Family History  Problem Relation Age of Onset   Heart attack Father    Emphysema Father    Aneurysm Mother        CEREBRAL   Emphysema Mother    Dementia Mother    Diabetes Son    Hypertension Son    Hypertension Son    Cancer Sister        Widely Metastatic   Asthma Son        x2   Breast cancer Paternal Aunt    Rheum arthritis Maternal Aunt     Past Surgical History:  Procedure Laterality Date   A-FLUTTER ABLATION N/A 08/16/2016   Procedure: A-Flutter Ablation;  Surgeon: Evans Lance, MD;  Location: [facility];  Service: Cardiovascular;  Laterality: N/A;   ANTERIOR CERVICAL DECOMP/DISCECTOMY FUSION     3 levels, per family   APPENDECTOMY  01-25-11   CARPAL TUNNEL RELEASE  01-25-11   Bil.   CERVICAL SPINE SURGERY  01-25-11   2005-multiple levels   CHOLECYSTECTOMY  01-25-11   '07   COLONOSCOPY WITH PROPOFOL  N/A 07/23/2012   Procedure: COLONOSCOPY WITH PROPOFOL;  Surgeon: Garlan Fair, MD;  [facility];  Service: Endoscopy;  Laterality: N/A;   detatched retna Bilateral    done July & August- 2019   EYE SURGERY Bilateral 2019   repair of retina detachment- Dr. Zadie Rhine at surgical center    JOINT REPLACEMENT  01-25-11   6'03 [place]  01-25-11   '04-right, revised x2   no gag reflex     Pt does not havea gag reflex following  siurgery to neck. Family unsure of date. Pt takes pill in apple sauce.   SPINAL CORD STIMULATOR BATTERY EXCHANGE N/A 01/12/2018   Procedure: Spinal cord stimulator battery replacement;  Surgeon: Clydell Hakim, MD;  Location: [facility];  Service: Neurosurgery;  Laterality: N/A;  left   SPINAL CORD STIMULATOR IMPLANT  2009 APPROX   DR. ELSNER   SPINAL CORD STIMULATOR REMOVAL N/A 06/04/2018   Procedure: Removal of spinal cord stimulator, thoracic paddle and generator;  Surgeon: Kristeen Miss, MD;  Location: [facility];  Service: Neurosurgery;  Laterality: N/A;   THORACIC SPINE SURGERY  01-25-11   6'02- then nerve stimulator implanted after   TOTAL HIP ARTHROPLASTY Right 07/29/2020   Procedure: RIGHT TOTAL HIP ARTHROPLASTY ANTERIOR APPROACH;  Surgeon: Marybelle Killings, MD;  Location: [facility];  Service: Orthopedics;  Laterality: Right;   TOTAL KNEE REVISION  02/01/2011   Procedure: TOTAL KNEE REVISION;  Surgeon: Mauri Pole;  [facility];  Service: Orthopedics;  Laterality: Right;   TUBAL LIGATION     Social History   Occupational History   Occupation: disabled  Tobacco  Use   Smoking status: Former    Packs/day: 0.75    Years: 50.00    Total pack years: 37.50    Types: Cigarettes    Start date: 09/13/1961    Quit date: 05/2016    Years since quitting: 5.4   Smokeless tobacco: Never   Tobacco comments:    Decreased Down to 2/3 a Day if [place] It  Vaping Use   Vaping Use: Never used  Substance and Sexual Activity   Alcohol use: No     Alcohol/week: 0.0 standard drinks of alcohol    Comment: none in 10 years   Drug use: No   Sexual activity: Not Currently

## 2021-11-01 ENCOUNTER — Other Ambulatory Visit: Payer: Self-pay | Admitting: Family Medicine

## 2021-11-02 ENCOUNTER — Ambulatory Visit: Payer: Medicare Other | Admitting: Orthopaedic Surgery

## 2021-11-08 ENCOUNTER — Other Ambulatory Visit: Payer: Self-pay | Admitting: Family Medicine

## 2021-11-08 DIAGNOSIS — E1169 Type 2 diabetes mellitus with other specified complication: Secondary | ICD-10-CM

## 2021-11-15 ENCOUNTER — Telehealth: Payer: Self-pay | Admitting: Pharmacist

## 2021-11-15 NOTE — Progress Notes (Deleted)
Chronic Care Management Pharmacy Note  11/15/2021 Name:  Destiny Hughes MRN:  [id number] DOB:  Oct 19, 1945  Summary: LDL extremely elevated and not at goal < 70 BP not at goal < 140/90 A1c not at goal < 7%   Recommendations/Changes made from today's visit: -Recommended increasing to high intensity statin therapy based on extremely elevated LDL -Recommended repeat A1c and consider increasing metformin -Collaborated with PCP to prescribe glucometer -Recommended switching Benadryl to a safer alternative such as Claritin, Zyrtec, or Allegra -Collaborated with PCP to consider increase dose of cyclobenzaprine to 1 full tablet  Plan: Confirmed refills of all medications that patient filled at the pharmacy [name] daughter in law to call back to confirm medications missing Follow up after discussion with PCP   Subjective: Destiny Hughes is an 76 y.o. year old female who is a primary patient of Volanda Napoleon, Langley Adie, MD.  The CCM team was consulted for assistance with disease management and care coordination needs.    Engaged with patient face to face for initial visit in response to provider referral for pharmacy case management and/or care coordination services.   Consent to Services:  The patient was given the following information about Chronic Care Management services today, agreed to services, and gave verbal consent: 1. CCM service includes personalized support from designated clinical staff supervised by the primary care provider, including individualized plan of care and coordination with other care providers 2. 24/7 contact phone numbers for assistance for urgent and routine care needs. 3. Service will only be billed when office clinical staff spend 20 minutes or more in a month to coordinate care. 4. Only one practitioner may furnish and bill the service in a calendar month. 5.The patient may stop CCM services at any time (effective at the end of the month) by phone call to the  office staff. 6. The patient will be responsible for cost sharing (co-pay) of up to 20% of the service fee (after annual deductible is met). Patient agreed to services and consent obtained.  Patient Care Team: Billie Ruddy, MD as PCP - General (Family Medicine) Evans Lance, MD as PCP - Cardiology (Cardiology) Paralee Cancel, MD as Consulting Physician (Orthopedic Surgery) Darlin Coco, MD as Consulting Physician (Cardiology) Garlan Fair, MD as Consulting Physician (Gastroenterology) Nicholaus Bloom, MD (Anesthesiology) Viona Gilmore, [facility] as Pharmacist (Pharmacist)  Recent office visits: 10/12/21 Alysia Penna, MD: Patient presented for viral URI.  03/08/21 Grier Mitts MD - Patient was seen for Hematoma and additional issues. Started Cyclobenzaprine 2.5 mg daily as needed. Follow up if symptoms worsen or fail to improve.   03/01/2021 Glenna Durand LPN - Medicare annual wellness exam.  Recent consult visits: 10/26/21 Bevely Palmer Person, PA (ortho): Patient presented for right hip pain. Prescribed meloxicam daily.  09/14/21 Norma Fredrickson MD (psychiatry) - Patient was seen for Major depressive disorder with single episode, in full remission. Plan to decrease temazepam.    06/16/2021 Baltazar Apo MD (pulmonary disease) - Patient was seen for chronic rhinitis and an additional issue. Started Zpac, Prednisone 20 mg daily. Restart your fluticasone (Flonase) nasal spray, 2 sprays each nostril once daily every day to prevent drainage. Restart your ipratropium (Atrovent) nasal spray.  You can use 2 sprays each nostril up to 2-3 times daily to stop drainage. Follow up in 6 months.    06/08/2021 Norma Fredrickson MD(psychiatry) - Patient was seen for Major depression in remission. No medication changes. No follow up noted.    01/22/2021 Herbie Baltimore  Byrum MD (pulmonary) - Patient was seen for Chronic obstructive pulmonary disease, unspecified COPD type. Started Albuterol 2.5 mg - 3 mLs by  nebulization every 6  hours as needed. No follow up noted.    12/01/2020 Rodell Perna MD (orthopedic) - Patient was seen for unilateral primary osteoarthritis, left knee. No medication changes. Follow up if symptoms worsen or fail to improve.    10/28/2020 Norma Fredrickson MD (psychiatry) - Patient was seen for Major depression in remission. No medication changes. Follow up in 3 months.   10/23/2020 Baltazar Apo MD (pulmonary) - Patient was seen for Chronic obstructive pulmonary disease, unspecified COPD type. Changed Flonase to 2 sprays daily. Decreased Lisinopril to 5 mg daily. Discontinued Aspirin, Norco and Ondansetron. Follow up in 3 months sooner if needed.   Hospital visits: None in previous 6 months   Objective:  Lab Results  Component Value Date   CREATININE 0.92 07/30/2020   BUN 8 07/30/2020   GFR 46.89 (L) 02/21/2019   GFRNONAA >60 07/30/2020   GFRAA 60 03/02/2020   NA 129 (L) 07/30/2020   K 4.2 07/30/2020   CALCIUM 8.6 (L) 07/30/2020   CO2 32 07/30/2020   GLUCOSE 238 (H) 07/30/2020    Lab Results  Component Value Date/Time   HGBA1C 7.5 (A) 07/08/2020 11:41 AM   HGBA1C 7.9 (A) 06/22/2020 01:57 PM   HGBA1C 9.2 (H) 03/02/2020 09:00 AM   HGBA1C 7.3 (H) 02/21/2019 02:41 PM   HGBA1C 6.4 04/28/2015 12:00 AM   GFR 46.89 (L) 02/21/2019 02:41 PM   GFR 67.57 02/14/2018 11:04 AM   MICROALBUR 0.4 05/22/2014 02:02 PM    Last diabetic Eye exam: No results found for: "HMDIABEYEEXA"  Last diabetic Foot exam: No results found for: "HMDIABFOOTEX"   Lab Results  Component Value Date   CHOL 338 (H) 03/02/2020   HDL 52 03/02/2020   LDLCALC 219 (H) 03/02/2020   LDLDIRECT 168.0 02/21/2019   TRIG 397 (H) 03/02/2020   CHOLHDL 6.5 (H) 03/02/2020       Latest Ref Rng & Units 07/27/2020    3:12 PM 03/02/2020    9:00 AM 01/03/2018    2:00 PM  Hepatic Function  Total Protein 6.5 - 8.1 g/dL 6.9  6.9  7.2   Albumin 3.5 - 5.0 g/dL 4.0   4.2   AST 15 - 41 U/L '12  14  11   ' ALT 0 - 44  U/L '13  15  8   ' Alk Phosphatase 38 - 126 U/L 64   82   Total Bilirubin 0.3 - 1.2 mg/dL 0.6  0.7  <0.2     Lab Results  Component Value Date/Time   TSH 1.75 03/02/2020 09:00 AM   TSH 2.300 12/17/2018 11:24 AM   FREET4 1.48 12/17/2018 11:24 AM   FREET4 1.59 (H) 09/22/2016 01:03 PM       Latest Ref Rng & Units 07/31/2020    3:48 AM 07/30/2020    2:34 AM 07/27/2020    3:12 PM  CBC  WBC 4.0 - 10.5 K/uL 8.1  9.8  6.7   Hemoglobin 12.0 - 15.0 g/dL 10.6  11.3  13.5   Hematocrit 36.0 - 46.0 % 31.3  33.7  40.6   Platelets 150 - 400 K/uL 269  321  379     Lab Results  Component Value Date/Time   VD25OH 64 09/01/2015 11:00 AM   VD25OH 40 04/28/2015 03:25 PM   No  Clinical ASCVD:  The ASCVD Risk score (Arnett DK,  et al., 2019) failed to calculate for the following reasons:   The valid total cholesterol range is 130 to 320 mg/dL       03/08/2021    9:34 AM 03/01/2021   10:46 AM 02/27/2020   10:58 AM  Depression screen PHQ 2/9  Decreased Interest 3 0 0  Down, Depressed, Hopeless 0 1 0  PHQ - 2 Score 3 1 0  Altered sleeping 1  0  Tired, decreased energy 2  0  Change in appetite 0  0  Feeling bad or failure about yourself  0  0  Trouble concentrating 0  0  Moving slowly or fidgety/restless 0  0  Suicidal thoughts 0  0  PHQ-9 Score 6  0  Difficult doing work/chores   Not difficult at all    CHA2DS2/VAS Stroke Risk Points  Current as of 5 days ago     6 >= 2 Points: High Risk  1 - 1.99 Points: Medium Risk  0 Points: Low Risk    Last Change: N/A      Details    This score determines the patient's risk of having a stroke if the  patient has atrial fibrillation.       Points Metrics  1 Has Congestive Heart Failure:  Yes    Current as of 5 days ago  0 Has Vascular Disease:  No    Current as of 5 days ago  1 Has Hypertension:  Yes    Current as of 5 days ago  2 Age:  7    Current as of 5 days ago  1 Has Diabetes:  Yes    Current as of 5 days ago  0 Had Stroke:  No  Had  TIA:  No  Had Thromboembolism:  No    Current as of 5 days ago  1 Female:  Yes    Current as of 5 days ago       Social History   Tobacco Use  Smoking Status Former   Packs/day: 0.75   Years: 50.00   Total pack years: 37.50   Types: Cigarettes   Start date: 09/13/1961   Quit date: 05/2016   Years since quitting: 5.4  Smokeless Tobacco Never  Tobacco Comments   Decreased Down to 2/3 a Day if Thinking [place]ouit It   BP Readings from Last 3 Encounters:  10/12/21 130/64  06/16/21 124/78  04/28/21 (!) 148/80   Pulse Readings from Last 3 Encounters:  10/12/21 83  06/16/21 (!) 57  03/08/21 64   Wt Readings from Last 3 Encounters:  10/12/21 190 lb 6 oz (86.4 kg)  06/16/21 184 lb 6.4 oz (83.6 kg)  03/01/21 182 lb (82.6 kg)   BMI Readings from Last 3 Encounters:  10/12/21 30.73 kg/m  06/16/21 29.76 kg/m  03/01/21 28.94 kg/m    Assessment/Interventions: Review of patient past medical history, allergies, medications, health status, including review of consultants reports, laboratory and other test data, was performed as part of comprehensive evaluation and provision of chronic care management services.   SDOH:  (Social Determinants of Health) assessments and interventions performed: Yes   SDOH Screenings   Alcohol Screen: Low Risk  (02/27/2020)   Alcohol Screen    Last Alcohol Screening Score (AUDIT): 0  Depression (PHQ2-9): Medium Risk (03/08/2021)   Depression (PHQ2-9)    PHQ-2 Score: 6  Financial Resource Strain: Low Risk  (04/28/2021)   Overall Financial Resource Strain (CARDIA)    Difficulty of Paying Living Expenses:  Not very hard  Food Insecurity: No Food Insecurity (03/01/2021)   Hunger Vital Sign    Worried About Running Out of Food in the Last Year: Never true    Ran Out of Food in the Last Year: Never true  Housing: Low Risk  (02/27/2020)   Housing    Last Housing Risk Score: 0  Physical Activity: Inactive (03/01/2021)   Exercise Vital Sign    Days of  Exercise per Week: 0 days    Minutes of Exercise per Session: 0 min  Social Connections: Moderately Isolated (02/27/2020)   Social Connection and Isolation Panel [NHANES]    Frequency of Communication with Friends and Family: More than three times a week    Frequency of Social Gatherings with Friends and Family: Twice a week    Attends Religious Services: Never    Marine scientist or Organizations: No    Attends Archivist Meetings: Never    Marital Status: Married  Stress: Stress Concern Present (03/01/2021)   [employer] [place]    Feeling of Stress : To some extent  Tobacco Use: Medium Risk (10/12/2021)   Patient History    Smoking Tobacco Use: Former    Smokeless Tobacco Use: Never    Passive Exposure: Not on file  Transportation Needs: No Transportation Needs (04/28/2021)   PRAPARE - Hydrologist (Medical): No    Lack of Transportation (Non-Medical): No   Patients husband states she fell and bumped her head two weeks ago but she is feeling better. Patient has a small knot on her head and does not remember what happened with her fall. She was standing up from the sink and just woke up on the floor. She denies any dizziness and recommended follow up with PCP.  Patient has had to use a cane more and her husband also uses a cane. She mostly uses it for knee pain. Patient does walk around the house and gets in the car easily but is not going shopping anymore.   Patient denies any concerns with her medications causing side effects. She is unsure if her muscle relaxer is helping and inquired about increasing the dose.  Patient reports she still only sleeps 4 hours with the temazepam. She also gets irritable when she tries to lay back down to go to bed. She is working on getting a Physicist, medical but she also used to work nights and thinks this has messed with her sleep cycle. She also admits  that her short term memory has been getting worse.  Her son's wife manages her medications and has had some issues with getting them straightened out in the past. She takes them at 4 times a day.   CCM Care Plan  Allergies  Allergen Reactions   Albuterol Shortness Of Breath and Other (See Comments)   Penicillins Anaphylaxis, Swelling and Other (See Comments)    Has patient had a PCN reaction causing immediate rash, facial/tongue/throat swelling, SOB or lightheadedness with hypotension: Yes Has patient had a PCN reaction causing severe rash involving mucus membranes or skin necrosis: Rash Has patient had a PCN reaction that required hospitalization: No Has patient had a PCN reaction occurring within the last 10 years: No If all of the above answers are "NO", then may proceed with Cephalosporin use.   Sulfa Antibiotics Swelling and Other (See Comments)    Throat swells, but no shortness of breath noted   Codeine Nausea  And Vomiting   Ecotrin [Aspirin] Nausea And Vomiting   Zanaflex [Tizanidine] Other (See Comments)    Possible confusion    Medications Reviewed Today     Reviewed by Persons, Bevely Palmer, PA (Physician Assistant) on 10/26/21 at 1651  Med List Status: <None>   Medication Order Taking? Sig Documenting Provider Last Dose Status Informant  acetaminophen (TYLENOL) 500 MG tablet [id number] No Take 1,000 mg by mouth every 6 (six) hours as needed for moderate pain. [provider] Taking Active Spouse/Significant Other  albuterol (PROVENTIL) (2.5 MG/3ML) 0.083% nebulizer solution [id number] No Take 3 mLs (2.5 mg total) by nebulization every 6 (six) hours as needed for wheezing or shortness of breath. Collene Gobble, MD Taking Active   albuterol (VENTOLIN HFA) 108 (90 Base) MCG/ACT inhaler [id number]  Inhale 2 puffs into the lungs every 6 (six) hours as needed for wheezing or shortness of breath. Collene Gobble, MD  Active   amLODipine (NORVASC) 5 MG tablet [id number]  TAKE  ONE TABLET BY MOUTH ONCE DAILY Billie Ruddy, MD  Active   buPROPion (WELLBUTRIN XL) 150 MG 24 hr tablet [id number]  Take 1 tablet (150 mg total) by mouth every morning. 3  qam Plovsky, Berneta Sages, MD  Active   cyclobenzaprine (FLEXERIL) 5 MG tablet [id number]  TAKE ONE-HALF TABLET DAILY AS NEEDED FOR MUSCLE SPASM Billie Ruddy, MD  Active   diphenhydrAMINE (BENADRYL) 25 MG tablet [id number] No Take 25 mg by mouth in the morning. [provider] Taking Active Spouse/Significant Other  donepezil (ARICEPT) 23 MG TABS tablet [id number]  1  qhs Plovsky, Berneta Sages, MD  Active   fluticasone [facility]) 50 MCG/ACT nasal spray [id number]  TWO SPRAYS IN EACH NOSTRIL ONCE DAILY Collene Gobble, MD  Active   gabapentin (NEURONTIN) 100 MG capsule [id number]  TAKE ONE CAPSULE TWICE A DAY Billie Ruddy, MD  Active   ipratropium (ATROVENT) 0.03 % nasal spray [id number] No PLACE 2 SPRAYS INTO BOTH NOSTRILS EVERY 12 HOURS Byrum, Rose Fillers, MD Taking Active Spouse/Significant Other  levothyroxine (SYNTHROID) 125 MCG tablet [id number]  TAKE ONE TABLET AT BEDTIME Billie Ruddy, MD  Active   lisinopril (ZESTRIL) 5 MG tablet [id number]  TAKE ONE TABLET BY MOUTH ONCE DAILY. please keep july appt Billie Ruddy, MD  Active   melatonin 5 MG TABS [id number] No Take 5 mg by mouth at bedtime. [provider] Taking Active Spouse/Significant Other  meloxicam (MOBIC) 7.5 MG tablet [id number] Yes Take 1 tablet (7.5 mg total) by mouth daily. Persons, Bevely Palmer, [place]  Active   metFORMIN (GLUCOPHAGE) 500 MG tablet [id number]  TAKE ONE TABLET TWICE DAILY with a meal Billie Ruddy, MD  Active   pantoprazole (PROTONIX) 40 MG tablet [id number]  TAKE ONE TABLET EVERY DAY Billie Ruddy, MD  Active   predniSONE (DELTASONE) 10 MG tablet [id number]  Take 2 tablets (20 mg total) by mouth daily with breakfast. Collene Gobble, MD  Active   propranolol (INDERAL) 80 MG tablet [id number]  TAKE ONE-HALF TABLET 3 TIMES DAILY Billie Ruddy, MD  Active   rosuvastatin (CRESTOR) 5 MG tablet [id number]  TAKE ONE TABLET EVERY DAY Billie Ruddy, MD  Active   sodium chloride 1 g tablet [id number] No TAKE ONE TABLET THREE TIMES DAILY Billie Ruddy, MD Taking Active   temazepam (RESTORIL) 15 MG capsule [id number]  Take 2 capsules (30 mg total) by mouth at bedtime. Norma Fredrickson, MD  Active   Tiotropium Bromide-Olodaterol (  STIOLTO RESPIMAT) 2.5-2.5 MCG/ACT AERS [id number]  Inhale 2 puffs into the lungs daily. Collene Gobble, MD  Active             Patient Active Problem List   Diagnosis Date Noted   Status post total hip replacement, right 07/29/2020   Diabetes mellitus ([place]) 07/08/2020   Unilateral primary osteoarthritis, left knee 03/30/2020   History of fusion of cervical spine 07/03/2019   Spinal cord stimulator dysfunction ([place]) 06/04/2018   Chronic cough 05/21/2018   Tremor 02/16/2018   Chronic rhinitis 02/05/2018   Hallucinations    Psychosis ([place]) 11/07/2016   Acute respiratory failure with hypoxia and hypercapnia ([place]) 11/03/2016   Other chest pain 10/23/2016   Tobacco abuse 10/23/2016   Major depression with psychotic features ([place]) 10/19/2016   Dementia with behavioral disturbance ([place]) 10/19/2016   Depression due to physical illness    Cognitive retention disorder    Atrial flutter ([place]) 08/15/2016   Paroxysmal atrial fibrillation ([facility]) 08/15/2016   SVT (supraventricular tachycardia) ([place]) 07/11/2016   Weight loss 07/08/2016   AKI (acute kidney injury) ([place]) 07/02/2016   Acute kidney injury ([place]) 07/01/2016   Hyperglycemia 07/01/2016   Hyponatremia 07/01/2016   Intractable nausea and vomiting 07/01/2016   Abnormal EKG 40/98/1191   Diastolic CHF ([place]) 47/82/9562   Medication management 04/28/2015   Emphysema lung ([place]) 12/26/2014   Gastroesophageal reflux disease without esophagitis 12/26/2014   Chronic bronchitis ([place]) 11/13/2014   COPD (chronic obstructive pulmonary disease) ([place]) 10/03/2014    At high risk for falls 10/03/2014   Other specified hypothyroidism 10/03/2014   Mitral valve prolapse 10/03/2014   Encounter for Medicare annual wellness exam 10/03/2014   IBS (irritable bowel syndrome) 04/01/2014   S/P total knee replacement 04/22/2013   Knee pain, chronic 04/22/2013   Essential hypertension 04/15/2013   Vitamin D deficiency 04/15/2013   Encounter for long-term (current) use of medications 04/15/2013   Prediabetes 04/15/2013   Hyperlipemia    Glaucoma    Chronic pain disorder    Chronic back pain     Immunization History  Administered Date(s) Administered   Fluad Quad(high Dose 65+) 12/17/2018, 12/23/2019, 12/24/2020   Influenza, High Dose Seasonal PF 04/13/2017, 12/05/2017   PFIZER(Purple Top)SARS-COV-2 Vaccination 04/26/2019, 05/21/2019, 01/18/2020   Pneumococcal Conjugate-13 10/03/2014   Pneumococcal Polysaccharide-23 10/18/2011   Td 03/21/2010   -needs CPE  -  Conditions to be addressed/monitored:  Hypertension, Hyperlipidemia, Diabetes, Atrial Fibrillation, GERD, COPD, Hypothyroidism, and Vitamin D deficiency, insomnia, memory loss  Conditions addressed this visit: ***  There are no care plans that you recently modified to display for this patient.    Medication Assistance: None required.  Patient affirms current coverage meets needs.  Compliance/Adherence/Medication fill history: Care Gaps: COVID booster, tetanus, DEXA scan, shingrix, eye exam, foot exam, A1c, influenza Last BP -  130/64 on 10/12/2021 Last A1C - 7.5 on 07/08/2020  Star-Rating Drugs: Lisinopril 5 mg - last filled 11/08/2021 30 DS at [facility] Metformin 500 mg - last filled 11/08/2021 45 DS at [facility] Rosuvastatin 5 mg  - last filled 09/29/2021 90 DS at [name]  Patient's preferred pharmacy is:  [facility], [place] [address] [place] [address] Phone: [phone] Fax: [phone]  [id number]  [place] [id number]: [phone]: [phone] Uses pill box? Yes Pt endorses  90% compliance - missing medications  We discussed: Benefits of medication synchronization, packaging and delivery as well as enhanced pharmacist oversight with [employer]. Patient decided to: Continue current medication management strategy  Care Plan and Follow Up Patient Decision:  Patient agrees to Care Plan and Follow-up.  Plan: The care management team will reach out to the patient again over the next 30 days.  [name], PharmD, [place] Pharmacist [place] at [place]

## 2021-11-15 NOTE — Chronic Care Management (AMB) (Signed)
    Chronic Care Management Pharmacy Assistant   Name: Destiny Hughes  MRN: [id number] DOB: 11-21-1945  11/16/2021 APPOINTMENT REMINDER  Destiny Hughes was reminded to have all medications, supplements and any blood glucose and blood pressure readings available for review with [name], Pharm. D, at her telephone visit on 11/16/2021 at 8:30.  Care Gaps: AWV - completed 03/01/2021 Last BP - 130/64 on 10/12/2021 Last A1C - 7.5 on 07/08/2020 Foot exam - never done Eye exam - never done Shingrix - never done Dexa Scan - never done Covid booster - overdue Tdap - overdue HGA1C - overdue Flu - due  Star Rating Drug: Lisinopril 5 mg - last filled 11/08/2021 30 DS at [facility] Metformin 500 mg - last filled 11/08/2021 45 DS at [facility] Rosuvastatin 5 mg  - last filled 09/29/2021 90 DS at [facility]  Any gaps in medications fill history? No  Gennie Alma [facility]  Catering manager [phone]

## 2021-11-16 ENCOUNTER — Telehealth: Payer: Medicare Other

## 2021-11-16 ENCOUNTER — Telehealth: Payer: Self-pay | Admitting: Pharmacist

## 2021-11-16 NOTE — Telephone Encounter (Signed)
  Chronic Care Management   Outreach Note  11/16/2021 Name: Destiny Hughes MRN: [id number] DOB: 1946/01/14  Referred by: Billie Ruddy, MD  Patient had a phone appointment scheduled with clinical pharmacist today.  An unsuccessful telephone outreach was attempted today. The patient was referred to the pharmacist for assistance with care management and care coordination.   If possible, a message was left to return call to: [phone] or to [facility] at [facility]: [place], PharmD, [place] Pharmacist [place] at [place]

## 2021-11-17 ENCOUNTER — Telehealth: Payer: Self-pay | Admitting: Pharmacist

## 2021-11-17 NOTE — Chronic Care Management (AMB) (Signed)
    Chronic Care Management Pharmacy Assistant   Name: Destiny Hughes  MRN: [id number] DOB: [birth year]  Reason for Encounter: Reschedule 11/16/2021 appointment with [name] Clinical Pharmacist.    Spoke with patient, rescheduled appointment to 12/14/2021.  [place] Pharmacist Assistant [phone]

## 2021-11-18 ENCOUNTER — Ambulatory Visit: Payer: Medicare Other | Admitting: Surgery

## 2021-11-24 ENCOUNTER — Ambulatory Visit: Payer: Medicare Other | Admitting: Family Medicine

## 2021-12-13 ENCOUNTER — Telehealth: Payer: Self-pay | Admitting: Pharmacist

## 2021-12-13 NOTE — Chronic Care Management (AMB) (Signed)
    Chronic Care Management Pharmacy Assistant   Name: Destiny Hughes  MRN: [id number] DOB: [birth year]  Reason for Encounter: Reschedule appointment.   Spoke with patient, she states her son cancelled the appointment. She did not want to reschedule until she could speak with her son, she is unsure if he is wanting to be with her for the appointment.  She will have her son return my call to reschedule.    [place] Pharmacist Assistant [phone]

## 2021-12-14 ENCOUNTER — Telehealth: Payer: Medicare Other

## 2021-12-15 ENCOUNTER — Telehealth: Payer: Self-pay | Admitting: Family Medicine

## 2021-12-15 NOTE — Telephone Encounter (Signed)
Pt thinks she has a UTI burning upon urination, requesting medication to treat, declined OV

## 2021-12-16 NOTE — Telephone Encounter (Signed)
Pt says she cannot come into the office because her spouse is sick. states she will just drink cranberry juice

## 2021-12-17 ENCOUNTER — Other Ambulatory Visit: Payer: Self-pay | Admitting: Family Medicine

## 2021-12-21 ENCOUNTER — Other Ambulatory Visit (HOSPITAL_COMMUNITY): Payer: Self-pay | Admitting: Psychiatry

## 2021-12-21 MED ORDER — DONEPEZIL HCL 23 MG PO TABS: ORAL_TABLET | ORAL | 5 refills | Status: DC

## 2021-12-24 ENCOUNTER — Other Ambulatory Visit: Payer: Self-pay | Admitting: Family Medicine

## 2021-12-24 ENCOUNTER — Other Ambulatory Visit (HOSPITAL_COMMUNITY): Payer: Self-pay | Admitting: Psychiatry

## 2021-12-24 DIAGNOSIS — E1169 Type 2 diabetes mellitus with other specified complication: Secondary | ICD-10-CM

## 2021-12-24 DIAGNOSIS — G8929 Other chronic pain: Secondary | ICD-10-CM

## 2021-12-24 DIAGNOSIS — E782 Mixed hyperlipidemia: Secondary | ICD-10-CM

## 2021-12-30 ENCOUNTER — Other Ambulatory Visit (HOSPITAL_COMMUNITY): Payer: Self-pay | Admitting: Psychiatry

## 2022-01-04 ENCOUNTER — Other Ambulatory Visit: Payer: Self-pay | Admitting: Family Medicine

## 2022-01-13 ENCOUNTER — Telehealth: Payer: Self-pay | Admitting: Emergency Medicine

## 2022-01-13 NOTE — Telephone Encounter (Signed)
Called and spoke with patient's son Aaron Edelman. He stated that the patient has been experiencing more SOB over the past few weeks. He confirmed that she is still using her Stiolto two puffs once daily as well as her albuterol inhaler. Per Aaron Edelman, she feels that the [employer] only works for a few hours and she wants to use it twice a day. He will not allow her to use it twice a day. She is noticing the SOB mostly with exertion.   He denied any increased coughing, body aches, or chest pain.   He wanted to know if there was a stronger inhaler that she could use.   Pharmacy is [facility], can you please advise? Thanks!

## 2022-01-13 NOTE — Telephone Encounter (Signed)
Spoke with the pt's son and notified of response per RB  He verbalized understanding  Appt for 01/27/22 with Eustaquio Maize was scheduld  Nothing further needed

## 2022-01-13 NOTE — Telephone Encounter (Signed)
There isn't really a stronger inhaler, but there may be alternatives that are delivered better or that may work better for her. She should probably be seen by APP or RB to assess the sx and decide whether to try an alternative.

## 2022-01-26 ENCOUNTER — Other Ambulatory Visit: Payer: Self-pay | Admitting: Family Medicine

## 2022-01-27 ENCOUNTER — Encounter: Payer: Self-pay | Admitting: Primary Care

## 2022-01-27 ENCOUNTER — Ambulatory Visit (INDEPENDENT_AMBULATORY_CARE_PROVIDER_SITE_OTHER): Payer: Medicare Other | Admitting: Primary Care

## 2022-01-27 VITALS — BP 130/66 | HR 67 | Temp 98.0°F | Ht 65.0 in | Wt 190.6 lb

## 2022-01-27 DIAGNOSIS — J9602 Acute respiratory failure with hypercapnia: Secondary | ICD-10-CM | POA: Diagnosis not present

## 2022-01-27 DIAGNOSIS — J9601 Acute respiratory failure with hypoxia: Secondary | ICD-10-CM | POA: Diagnosis not present

## 2022-01-27 DIAGNOSIS — J449 Chronic obstructive pulmonary disease, unspecified: Secondary | ICD-10-CM | POA: Diagnosis not present

## 2022-01-27 MED ORDER — BREZTRI AEROSPHERE 160-9-4.8 MCG/ACT IN AERO: 2.0000 | INHALATION_SPRAY | Freq: Two times a day (BID) | RESPIRATORY_TRACT | 0 refills | Status: DC

## 2022-01-27 MED ORDER — IPRATROPIUM-ALBUTEROL 0.5-2.5 (3) MG/3ML IN SOLN: 3.0000 mL | Freq: Four times a day (QID) | RESPIRATORY_TRACT | 0 refills | Status: DC | PRN

## 2022-01-27 NOTE — Patient Instructions (Addendum)
Recommendations: - Stop Stilto - Start SunGard- take two puffs every morning and evening (rinse mouth after use) - Use albuterol rescue inhaler 2 puffs every 4-6 hours OR ipratropium-albuterol nebulizer every 4-6 hours for breakthrough shortness of breath/wheezing   Follow-up: - 2-4 weeks with Dr. [name] (if not openings can see Beth NP)

## 2022-01-27 NOTE — Progress Notes (Signed)
$'@Patient'l$  ID: Destiny Hughes, female    DOB: 07-Feb-1946, 76 y.o.   MRN: [id number]  Chief Complaint  Patient presents with   Follow-up    Referring provider: Billie Ruddy, MD  HPI: 76 year old female, former smoker.  Past medical history significant for hypertension, A-fib, diastolic heart failure, COPD, emphysema, GERD, diabetes.  Patient Dr. Lamonte Hughes  01/27/2022 Patient presents today for follow-up COPD. Dyspnea symptoms have been worse last 3 months.  She is using Stiolto more than prescribed, does not feel albuterol is helpful. She gets out of breath walking from the house to the car. She does not get much activity. She uses wheelchair to get around most of the time outside the home. She has some wheezing symptoms. No cough or fever.    Allergies  Allergen Reactions   Albuterol Shortness Of Breath and Other (See Comments)   Penicillins Anaphylaxis, Swelling and Other (See Comments)    Has patient had a PCN reaction causing immediate rash, facial/tongue/throat swelling, SOB or lightheadedness with hypotension: Yes Has patient had a PCN reaction causing severe rash involving mucus membranes or skin necrosis: Rash Has patient had a PCN reaction that required hospitalization: No Has patient had a PCN reaction occurring within the last 10 years: No If all of the above answers are "NO", then may proceed with Cephalosporin use.   Sulfa Antibiotics Swelling and Other (See Comments)    Throat swells, but no shortness of breath noted   Codeine Nausea And Vomiting   Ecotrin [Aspirin] Nausea And Vomiting   Zanaflex [Tizanidine] Other (See Comments)    Possible confusion    Immunization History  Administered Date(s) Administered   Fluad Quad(high Dose 65+) 12/17/2018, 12/23/2019, 12/24/2020   Influenza, High Dose Seasonal PF 04/13/2017, 12/05/2017   PFIZER(Purple Top)SARS-COV-2 Vaccination 04/26/2019, 05/21/2019, 01/18/2020   Pneumococcal Conjugate-13 10/03/2014   Pneumococcal  Polysaccharide-23 10/18/2011   Td 03/21/2010    Past Medical History:  Diagnosis Date   Arthritis    OA- knees & back  neck   Chronic back pain    Cognitive retention disorder    Collapse of right lung    COPD (chronic obstructive pulmonary disease) (HCC)    Hx. Bronchitis occ, 01-25-11 somes issue now taking  Z-pak-started 01-24-11/Scar tissue  present  from previous lung collapse   Dementia with psychosis ([place])    Depression    Depression    Diabetes mellitus ([place]) 07/08/2020   Dyspnea    Dysrhythmia 2018   treated with Ablation    GERD (gastroesophageal reflux disease)    tx. TUMS   Glaucoma    Headache    History of kidney stones    surgically treated   Hyperlipemia    Hypertension    Hypothyroidism    tx. Levothyroxine   Major depression with psychotic features ([place])    Nephrolithiasis    Neuromuscular disorder ([place]) 01-25-11   Pain/nerve stimulator implanted -2 yrs ago.   Reflex, gag, absent    Reflex, gag, absent     Tobacco History: Social History   Tobacco Use  Smoking Status Former   Packs/day: 0.75   Years: 50.00   Total pack years: 37.50   Types: Cigarettes   Start date: 09/13/1961   Quit date: 05/2016   Years since quitting: 5.6  Smokeless Tobacco Never  Tobacco Comments   Decreased Down to 2/3 a Day if Thinking Abouit It   Counseling given: Not Answered Tobacco comments: Decreased Down to 2/3 a Day if Thinking  Abouit It   Outpatient Medications Prior to Visit  Medication Sig Dispense Refill   acetaminophen (TYLENOL) 500 MG tablet Take 1,000 mg by mouth every 6 (six) hours as needed for moderate pain.     albuterol (VENTOLIN HFA) 108 (90 Base) MCG/ACT inhaler Inhale 2 puffs into the lungs every 6 (six) hours as needed for wheezing or shortness of breath. 8.5 g 6   amLODipine (NORVASC) 5 MG tablet TAKE ONE TABLET BY MOUTH ONCE DAILY 90 tablet 1   buPROPion (WELLBUTRIN XL) 150 MG 24 hr tablet Take 1 tablet (150 mg total) by mouth every morning. 3   qam 90 tablet 6   cyclobenzaprine (FLEXERIL) 5 MG tablet TAKE ONE-HALF TABLET DAILY AS NEEDED FOR MUSCLE SPASM 30 tablet 0   diphenhydrAMINE (BENADRYL) 25 MG tablet Take 25 mg by mouth in the morning.     donepezil (ARICEPT) 23 MG TABS tablet 1  qhs 30 tablet 5   fluticasone (FLONASE) 50 MCG/ACT nasal spray TWO SPRAYS IN EACH NOSTRIL ONCE DAILY 16 g 2   gabapentin (NEURONTIN) 100 MG capsule TAKE ONE CAPSULE TWICE A DAY 180 capsule 0   ipratropium (ATROVENT) 0.03 % nasal spray PLACE 2 SPRAYS INTO BOTH NOSTRILS EVERY 12 HOURS 30 mL 2   levothyroxine (SYNTHROID) 125 MCG tablet Take 1 tablet (125 mcg total) by mouth daily. 90 tablet 1   lisinopril (ZESTRIL) 5 MG tablet TAKE ONE TABLET BY MOUTH ONCE DAILY 90 tablet 1   melatonin 5 MG TABS Take 5 mg by mouth at bedtime.     meloxicam (MOBIC) 7.5 MG tablet Take 1 tablet (7.5 mg total) by mouth daily. 20 tablet 0   metFORMIN (GLUCOPHAGE) 500 MG tablet TAKE ONE TABLET TWICE DAILY with a meal 90 tablet 0   predniSONE (DELTASONE) 10 MG tablet Take 2 tablets (20 mg total) by mouth daily with breakfast. 10 tablet 0   propranolol (INDERAL) 80 MG tablet TAKE ONE-HALF TABLET 3 TIMES DAILY 135 tablet 0   rosuvastatin (CRESTOR) 5 MG tablet TAKE ONE TABLET EVERY DAY 90 tablet 0   sodium chloride 1 g tablet TAKE ONE TABLET THREE TIMES DAILY 180 tablet 1   temazepam (RESTORIL) 15 MG capsule Take 2 capsules (30 mg total) by mouth at bedtime. 30 capsule 5   Tiotropium Bromide-Olodaterol (STIOLTO RESPIMAT) 2.5-2.5 MCG/ACT AERS Inhale 2 puffs into the lungs daily. 12 g 1   albuterol (PROVENTIL) (2.5 MG/3ML) 0.083% nebulizer solution Take 3 mLs (2.5 mg total) by nebulization every 6 (six) hours as needed for wheezing or shortness of breath. 75 mL 12   pantoprazole (PROTONIX) 40 MG tablet TAKE ONE TABLET EVERY DAY 90 tablet 0   No facility-administered medications prior to visit.    Review of Systems  Review of Systems  Constitutional: Negative.   HENT: Negative.   Negative for congestion and postnasal drip.   Respiratory:  Positive for shortness of breath and wheezing. Negative for cough and chest tightness.   Cardiovascular: Negative.  Negative for chest pain and leg swelling.    Physical Exam  BP 130/66 (BP Location: Right Arm, Cuff Size: Large)   Pulse 67   Temp 98 F (36.7 C) (Oral)   Ht '5\' 5"'$  (1.651 m)   Wt 190 lb 9.6 oz (86.5 kg)   SpO2 94%   BMI 31.72 kg/m  Physical Exam Constitutional:      Appearance: Normal appearance.  HENT:     Head: Normocephalic and atraumatic.  Cardiovascular:  Rate and Rhythm: Normal rate.  Pulmonary:     Effort: Pulmonary effort is normal.     Breath sounds: Normal breath sounds.  Neurological:     General: No focal deficit present.     Mental Status: She is alert and oriented to person, place, and time. Mental status is at baseline.  Psychiatric:        Mood and Affect: Mood normal.        Behavior: Behavior normal.        Thought Content: Thought content normal.        Judgment: Judgment normal.      Lab Results:  CBC    Component Value Date/Time   WBC 8.1 07/31/2020 0348   RBC 3.54 (L) 07/31/2020 0348   HGB 10.6 (L) 07/31/2020 0348   HGB 12.8 08/16/2017 1600   HCT 31.3 (L) 07/31/2020 0348   HCT 38.2 08/16/2017 1600   PLT 269 07/31/2020 0348   PLT 363 08/16/2017 1600   MCV 88.4 07/31/2020 0348   MCV 88 08/16/2017 1600   MCH 29.9 07/31/2020 0348   MCHC 33.9 07/31/2020 0348   RDW 13.3 07/31/2020 0348   RDW 14.5 08/16/2017 1600   LYMPHSABS 1,770 09/30/2019 1220   LYMPHSABS 2.1 08/16/2017 1600   MONOABS 0.5 02/21/2019 1441   EOSABS 118 09/30/2019 1220   EOSABS 0.3 08/16/2017 1600   BASOSABS 22 09/30/2019 1220   BASOSABS 0.0 08/16/2017 1600    BMET    Component Value Date/Time   NA 129 (L) 07/30/2020 0234   NA 126 (L) 01/03/2018 1400   K 4.2 07/30/2020 0234   CL 90 (L) 07/30/2020 0234   CO2 32 07/30/2020 0234   GLUCOSE 238 (H) 07/30/2020 0234   BUN 8 07/30/2020 0234    BUN 14 01/03/2018 1400   CREATININE 0.92 07/30/2020 0234   CREATININE 1.05 (H) 03/02/2020 0900   CALCIUM 8.6 (L) 07/30/2020 0234   GFRNONAA >60 07/30/2020 0234   GFRNONAA 52 (L) 03/02/2020 0900   GFRAA 60 03/02/2020 0900    BNP    Component Value Date/Time   BNP 57 09/30/2019 1220    ProBNP    Component Value Date/Time   PROBNP 166.0 (H) 02/14/2018 1104    Imaging: No results found.   Assessment & Plan:   COPD (chronic obstructive pulmonary disease) ([place]) - Patient has severe obstructive airway disease. Increased symptom burden last 3 months. No acute  symptoms. No wheezing on exam. Recommend trial Breztri Aersophere two puffs twice daily and sending in RX for ipratropium-albuterol nebulizer to use every 6 hours as needed for shortness of breath/wheezing. FU in 2-4 weeks with Dr. Lamonte Hughes or [facility] NP.   Acute respiratory failure with hypoxia and hypercapnia (HCC) - Stable; O2 94% room air. She can not walk far enough to qualify for supplemental oxygen and does not want to be started on oxygen   Martyn Ehrich, NP 01/27/2022

## 2022-01-27 NOTE — Assessment & Plan Note (Addendum)
-   Patient has severe obstructive airway disease. Increased symptom burden last 3 months. No acute  symptoms. No wheezing on exam. Recommend trial Breztri Aersophere two puffs twice daily and sending in RX for ipratropium-albuterol nebulizer to use every 6 hours as needed for shortness of breath/wheezing. FU in 2-4 weeks with Dr. [name] or [facility] NP.

## 2022-01-27 NOTE — Assessment & Plan Note (Signed)
-   Stable; O2 94% room air. She can not walk far enough to qualify for supplemental oxygen and does not want to be started on oxygen

## 2022-01-31 ENCOUNTER — Telehealth: Payer: Self-pay | Admitting: Family Medicine

## 2022-01-31 NOTE — Telephone Encounter (Signed)
Tried calling patient to schedule Medicare Annual Wellness Visit (AWV) either virtually or in office.  No answer   Last AWV 03/01/21 please schedule with Nurse Health Adviser   45 min for awv-i and in office appointments 30 min for awv-s  phone/virtual appointments

## 2022-02-08 ENCOUNTER — Other Ambulatory Visit: Payer: Self-pay | Admitting: Family Medicine

## 2022-02-08 DIAGNOSIS — E1169 Type 2 diabetes mellitus with other specified complication: Secondary | ICD-10-CM

## 2022-02-15 ENCOUNTER — Ambulatory Visit (HOSPITAL_BASED_OUTPATIENT_CLINIC_OR_DEPARTMENT_OTHER): Payer: Medicare Other | Admitting: Psychiatry

## 2022-02-15 DIAGNOSIS — F324 Major depressive disorder, single episode, in partial remission: Secondary | ICD-10-CM | POA: Diagnosis not present

## 2022-02-15 MED ORDER — TEMAZEPAM 15 MG PO CAPS: 30.0000 mg | ORAL_CAPSULE | Freq: Every day | ORAL | 5 refills | Status: DC

## 2022-02-15 MED ORDER — BUPROPION HCL ER (XL) 150 MG PO TB24: 150.0000 mg | ORAL_TABLET | ORAL | 6 refills | Status: DC

## 2022-02-15 NOTE — Progress Notes (Signed)
Psychiatric Initial Adult Assessment   Patient Identification: Destiny Hughes MRN:  [id number] Date of Evaluation:  02/15/2022 Referral Source: [facility] Chief Complaint: Major depression remission     Today the patient is seen with her son Destiny Hughes.  They started off by sharing that the patient's husband Destiny Hughes 2 weeks ago.  He apparently had a difficult time towards the end of his life.  He fell and broke his arm and eventually had pneumonia and eventually was back and forth between hospitals and rehabs.  The funeral took place about 3 days ago.  The patient went to the funeral but was hesitant.  The patient denies being persistently depressed.  She clearly misses her husband.  She feels very lonely.  Her dedicated son's is doing everything he can to try to figure out what to do for his mother during the day.  Today they received telephone numbers for [facility] and procedure services of [facility].  A scenario could be that she could have some day care treatment.  Patient talks to get back to drive but that is unrealistic.  She has not driven a car in a decade.  The patient does all her basic ADLs.  She shows really minimal evidence of dementia.  He does take Aricept at a high dose.  She continues taking Wellbutrin as prescribed.  The patient drinks no alcohol and uses no drugs.  She shows no evidence of psychosis.  She is functioning reasonably well with assistance.  The patient does have a good relationship with her son Destiny Hughes and his wife.  We talked about the process of breathing takes time. Virtual Visit via Telephone Note  I connected with Destiny Hughes on 02/15/22 at  2:00 PM EST by telephone and verified that I am speaking with the correct person using two identifiers.  Location: Patient: home Provider: office   I discussed the limitations, risks, security and privacy concerns of performing an evaluation and management service by telephone and the  availability of in person appointments. I also discussed with the patient that there may be a patient responsible charge related to this service. The patient expressed understanding and agreed to proceed.      I discussed the assessment and treatment plan with the patient. The patient was provided an opportunity to ask questions and all were answered. The patient agreed with the plan and demonstrated an understanding of the instructions.   The patient was advised to call back or seek an in-person evaluation if the symptoms worsen or if the condition fails to improve as anticipated.  I provided 30 minutes of non-face-to-face time during this encounter.   Jerral Ralph, MD  (Hypo) Manic Symptoms:   Anxiety Symptoms:   Psychotic Symptoms:   PTSD Symptoms:   Past Psychiatric History: none  Previous Psychotropic Medications: Yes   Substance Abuse History in the last 12 months:  No.  Consequences of Substance Abuse:   Past Medical History:  Past Medical History:  Diagnosis Date   Arthritis    OA- knees & back  neck   Chronic back pain    Cognitive retention disorder    Collapse of right lung    COPD (chronic obstructive pulmonary disease) (HCC)    Hx. Bronchitis occ, 01-25-11 somes issue now taking  Z-pak-started 01-24-11/Scar tissue  present  from previous lung collapse   Dementia with psychosis ([place])    Depression    Depression    Diabetes mellitus ([place]) 07/08/2020  Dyspnea    Dysrhythmia 2018   treated with Ablation    GERD (gastroesophageal reflux disease)    tx. TUMS   Glaucoma    Headache    History of kidney stones    surgically treated   Hyperlipemia    Hypertension    Hypothyroidism    tx. Levothyroxine   Major depression with psychotic features ([place])    Nephrolithiasis    Neuromuscular disorder ([place]) 01-25-11   Pain/nerve stimulator implanted -2 yrs ago.   Reflex, gag, absent    Reflex, gag, absent     Past Surgical History:  Procedure Laterality  Date   A-FLUTTER ABLATION N/A 08/16/2016   Procedure: A-Flutter Ablation;  Surgeon: Evans Lance, MD;  Location: [place] [facility];  Service: Cardiovascular;  Laterality: N/A;   ANTERIOR CERVICAL DECOMP/DISCECTOMY FUSION     3 levels, per family   APPENDECTOMY  01-25-11   CARPAL TUNNEL RELEASE  01-25-11   [place].   CERVICAL SPINE SURGERY  01-25-11   2005-multiple levels   CHOLECYSTECTOMY  01-25-11   '07   COLONOSCOPY WITH PROPOFOL N/A 07/23/2012   Procedure: COLONOSCOPY WITH PROPOFOL;  Surgeon: Garlan Fair, MD;  Location: [facility];  Service: Endoscopy;  Laterality: N/A;   detatched retna Bilateral    done July & August- 2019   EYE SURGERY Bilateral 2019   repair of retina detachment- Dr. Zadie Rhine at surgical center    JOINT REPLACEMENT  01-25-11   6'03 [place]  01-25-11   '04-right, revised x2   no gag reflex     Pt does not havea gag reflex following  siurgery to neck. Family unsure of date. Pt takes pill in apple sauce.   SPINAL CORD STIMULATOR BATTERY EXCHANGE N/A 01/12/2018   Procedure: Spinal cord stimulator battery replacement;  Surgeon: Clydell Hakim, MD;  Location: [place];  Service: Neurosurgery;  Laterality: N/A;  left   SPINAL CORD STIMULATOR IMPLANT  2009 APPROX   DR. ELSNER   SPINAL CORD STIMULATOR REMOVAL N/A 06/04/2018   Procedure: Removal of spinal cord stimulator, thoracic paddle and generator;  Surgeon: Kristeen Miss, MD;  Location: [place];  Service: Neurosurgery;  Laterality: N/A;   THORACIC SPINE SURGERY  01-25-11   6'02- then nerve stimulator implanted after   TOTAL HIP ARTHROPLASTY Right 07/29/2020   Procedure: RIGHT TOTAL HIP ARTHROPLASTY ANTERIOR APPROACH;  Surgeon: Marybelle Killings, MD;  Location: [place];  Service: Orthopedics;  Laterality: Right;   TOTAL KNEE REVISION  02/01/2011   Procedure: TOTAL KNEE REVISION;  Surgeon: Mauri Pole;  Location: [facility];  Service: Orthopedics;  Laterality: Right;   TUBAL LIGATION      Family Psychiatric  History:   Family History:  Family History  Problem Relation Age of Onset   Heart attack Father    Emphysema Father    Aneurysm Mother        CEREBRAL   Emphysema Mother    Dementia Mother    Diabetes Son    Hypertension Son    Hypertension Son    Cancer Sister        Widely Metastatic   Asthma Son        x2   Breast cancer Paternal Aunt    Rheum arthritis Maternal Aunt     Social History:   Social History   Socioeconomic History   Marital status: Married    Spouse name: Not on file   Number of children: 2  Years of education: Not on file   Highest education level: Not on file  Occupational History   Occupation: disabled  Tobacco Use   Smoking status: Former    Packs/day: 0.75    Years: 50.00    Total pack years: 37.50    Types: Cigarettes    Start date: 09/13/1961    Quit date: 05/2016    Years since quitting: 5.7   Smokeless tobacco: Never   Tobacco comments:    Decreased Down to 2/3 a Day if Thinking [place] It  Vaping Use   Vaping Use: Never used  Substance and Sexual Activity   Alcohol use: No    Alcohol/week: 0.0 standard drinks of alcohol    Comment: none in 10 years   Drug use: No   Sexual activity: Not Currently  Other Topics Concern   Not on file  Social History Narrative   She is from [place]. She has always lived in [place]. She has traveled to [place], [place], [place], [place], & [place]. 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 Prior exposure to parakeets with last exposure remote in her current home. Previously had mold in her home that was in her bathroom & fixed. Prior exposure to hot tubs but none recently. Pt lives at home with [name], husband.  Has 2 childrean, 12th grade education.     Social Determinants of Health   Financial Resource Strain: Low Risk  (04/28/2021)   Overall Financial Resource Strain (CARDIA)    Difficulty of Paying Living  Expenses: Not very hard  Food Insecurity: No Food Insecurity (03/01/2021)   Hunger Vital Sign    Worried About Running Out of Food in the Last Year: Never true    Ran Out of Food in the Last Year: Never true  Transportation Needs: No Transportation Needs (04/28/2021)   PRAPARE - Hydrologist (Medical): No    Lack of Transportation (Non-Medical): No  Physical Activity: Inactive (03/01/2021)   Exercise Vital Sign    Days of Exercise per Week: 0 days    Minutes of Exercise per Session: 0 min  Stress: Stress Concern Present (03/01/2021)   [place]    Feeling of Stress : To some extent  Social Connections: Moderately Isolated (02/27/2020)   Social Connection and Isolation Panel [NHANES]    Frequency of Communication with Friends and Family: More than three times a week    Frequency of Social Gatherings with Friends and Family: Twice a week    Attends Religious Services: Never    Marine scientist or Organizations: No    Attends Archivist Meetings: Never    Marital Status: Married    Additional Social History:   Allergies:   Allergies  Allergen Reactions   Albuterol Shortness Of Breath and Other (See Comments)   Penicillins Anaphylaxis, Swelling and Other (See Comments)    Has patient had a PCN reaction causing immediate rash, facial/tongue/throat swelling, SOB or lightheadedness with hypotension: Yes Has patient had a PCN reaction causing severe rash involving mucus membranes or skin necrosis: Rash Has patient had a PCN reaction that required hospitalization: No Has patient had a PCN reaction occurring within the last 10 years: No If all of the above answers are "NO", then may proceed with Cephalosporin use.   Sulfa Antibiotics Swelling and Other (  See Comments)    Throat swells, but no shortness of breath noted   Codeine Nausea And Vomiting   Ecotrin [Aspirin] Nausea And  Vomiting   Zanaflex [Tizanidine] Other (See Comments)    Possible confusion    Metabolic Disorder Labs: Lab Results  Component Value Date   HGBA1C 7.5 (A) 07/08/2020   MPG 217 03/02/2020   MPG 137 (H) 04/28/2015   No results found for: "PROLACTIN" Lab Results  Component Value Date   CHOL 338 (H) 03/02/2020   TRIG 397 (H) 03/02/2020   HDL 52 03/02/2020   CHOLHDL 6.5 (H) 03/02/2020   VLDL 52.8 (H) 02/21/2019   LDLCALC 219 (H) 03/02/2020   LDLCALC 114 04/28/2015     Current Medications: Current Outpatient Medications  Medication Sig Dispense Refill   acetaminophen (TYLENOL) 500 MG tablet Take 1,000 mg by mouth every 6 (six) hours as needed for moderate pain.     albuterol (VENTOLIN HFA) 108 (90 Base) MCG/ACT inhaler Inhale 2 puffs into the lungs every 6 (six) hours as needed for wheezing or shortness of breath. 8.5 g 6   amLODipine (NORVASC) 5 MG tablet TAKE ONE TABLET BY MOUTH ONCE DAILY 90 tablet 1   Budeson-Glycopyrrol-Formoterol (BREZTRI AEROSPHERE) 160-9-4.8 MCG/ACT AERO Inhale 2 puffs into the lungs in the morning and at bedtime. 1 each 0   buPROPion (WELLBUTRIN XL) 150 MG 24 hr tablet Take 1 tablet (150 mg total) by mouth every morning. 3  qam 90 tablet 6   cyclobenzaprine (FLEXERIL) 5 MG tablet TAKE ONE-HALF TABLET DAILY AS NEEDED FOR MUSCLE SPASM 30 tablet 0   diphenhydrAMINE (BENADRYL) 25 MG tablet Take 25 mg by mouth in the morning.     donepezil (ARICEPT) 23 MG TABS tablet 1  qhs 30 tablet 5   fluticasone (FLONASE) 50 MCG/ACT nasal spray TWO SPRAYS IN EACH NOSTRIL ONCE DAILY 16 g 2   gabapentin (NEURONTIN) 100 MG capsule TAKE ONE CAPSULE TWICE A DAY 180 capsule 0   ipratropium (ATROVENT) 0.03 % nasal spray PLACE 2 SPRAYS INTO BOTH NOSTRILS EVERY 12 HOURS 30 mL 2   ipratropium-albuterol (DUONEB) 0.5-2.5 (3) MG/3ML SOLN Take 3 mLs by nebulization every 6 (six) hours as needed. 360 mL 0   levothyroxine (SYNTHROID) 125 MCG tablet Take 1 tablet (125 mcg total) by mouth  daily. 90 tablet 1   lisinopril (ZESTRIL) 5 MG tablet TAKE ONE TABLET BY MOUTH ONCE DAILY 90 tablet 1   melatonin 5 MG TABS Take 5 mg by mouth at bedtime.     meloxicam (MOBIC) 7.5 MG tablet Take 1 tablet (7.5 mg total) by mouth daily. 20 tablet 0   metFORMIN (GLUCOPHAGE) 500 MG tablet TAKE ONE TABLET TWICE DAILY with a meal 180 tablet 0   pantoprazole (PROTONIX) 40 MG tablet TAKE ONE TABLET EVERY DAY 90 tablet 0   predniSONE (DELTASONE) 10 MG tablet Take 2 tablets (20 mg total) by mouth daily with breakfast. 10 tablet 0   propranolol (INDERAL) 80 MG tablet TAKE ONE-HALF TABLET 3 TIMES DAILY 135 tablet 0   rosuvastatin (CRESTOR) 5 MG tablet TAKE ONE TABLET EVERY DAY 90 tablet 0   sodium chloride 1 g tablet TAKE ONE TABLET THREE TIMES DAILY 180 tablet 1   temazepam (RESTORIL) 15 MG capsule Take 2 capsules (30 mg total) by mouth at bedtime. 30 capsule 5   Tiotropium Bromide-Olodaterol (STIOLTO RESPIMAT) 2.5-2.5 MCG/ACT AERS Inhale 2 puffs into the lungs daily. 12 g 1   No current facility-administered medications for this  visit.    Neurologic: Headache: No Seizure: No Paresthesias:No  Musculoskeletal: Strength & Muscle Tone: decreased Gait & Station: unsteady Patient leans: N/A  Psychiatric Specialty Exam: ROS  There were no vitals taken for this visit.There is no height or weight on file to calculate BMI.  General Appearance: Casual  Eye Contact:  Good  Speech:  Clear and Coherent  Volume:  Normal  Mood:  NA  Affect:  depressed  Thought Process:  Goal Directed  Orientation:  Full (Time, Place, and Person)  Thought Content:  WDL  Suicidal Thoughts:  No  Homicidal Thoughts:  No  Memory:  Immediate;   Poor  Judgement:  Fair  Insight:  Lacking  Psychomotor Activity:  Normal  Concentration:    Recall:  Poor  Fund of Knowledge:Poor  Language: Fair  Akathisia:  No  Handed:  Right  AIMS (if indicated):   Assets:  Communication Skills  ADL's:  Impaired  Cognition: Impaired,   Moderate  Sleep:      Treatment Plan Summary:  This patient's first problem is that of major clinical depression.  At this time she is on maximum dose of Wellbutrin 4 and 50 mg.  We reduced her Restoril down from 30 to 15 mg.  At this time if she is to keep it at that low dose.  She is having some early morning awakening but I think in time this will change.  She is eating fairly well.  The possibility of adding Remeron in the future should be considered.  The patient has mild dementia.  She will continue her Aricept as prescribed.  Again give her some telephone numbers for some daycare programs.  I do not think she is a candidate for breathing therapy at this time.  She will return to see me in 2 months.

## 2022-02-16 ENCOUNTER — Other Ambulatory Visit: Payer: Self-pay | Admitting: Family Medicine

## 2022-02-19 ENCOUNTER — Other Ambulatory Visit: Payer: Self-pay | Admitting: Family Medicine

## 2022-02-21 ENCOUNTER — Telehealth (HOSPITAL_COMMUNITY): Payer: Self-pay

## 2022-02-21 NOTE — Telephone Encounter (Signed)
Destiny Hughes with  [name] called stating that this medication come in '30mg'$  tablets she is wanting you to send in a new script for 30 mg tablets to take once by mouth at bedtime. Please advise       temazepam (RESTORIL) 15 MG capsule 30 capsule 5 02/15/2022    Sig - Route: Take 2 capsules (30 mg total) by mouth at bedtime. - Oral   Sent to pharmacy as: temazepam (RESTORIL) 15 MG capsule   E-Prescribing Status: Receipt confirmed by pharmacy (02/15/2022  2:51 PM EST)

## 2022-02-23 ENCOUNTER — Other Ambulatory Visit (HOSPITAL_COMMUNITY): Payer: Self-pay | Admitting: *Deleted

## 2022-02-23 ENCOUNTER — Telehealth: Payer: Self-pay | Admitting: Emergency Medicine

## 2022-02-23 MED ORDER — TEMAZEPAM 30 MG PO CAPS: 30.0000 mg | ORAL_CAPSULE | Freq: Every day | ORAL | 5 refills | Status: DC

## 2022-02-24 MED ORDER — BREZTRI AEROSPHERE 160-9-4.8 MCG/ACT IN AERO: 2.0000 | INHALATION_SPRAY | Freq: Two times a day (BID) | RESPIRATORY_TRACT | 5 refills | Status: DC

## 2022-02-24 NOTE — Telephone Encounter (Signed)
Rx for Destiny Hughes has been sent to preferred pharmacy for pt. Called and spoke with pt's spouse letting him know this had been done and he verbalized understanding. Nothing further needed.

## 2022-03-01 ENCOUNTER — Ambulatory Visit (INDEPENDENT_AMBULATORY_CARE_PROVIDER_SITE_OTHER): Payer: Medicare Other

## 2022-03-01 DIAGNOSIS — Z23 Encounter for immunization: Secondary | ICD-10-CM

## 2022-03-02 ENCOUNTER — Telehealth: Payer: Self-pay

## 2022-03-02 NOTE — Telephone Encounter (Signed)
Unsuccessful attempt to reach patient on preferred number listed in notes for scheduled AWV. Voicemail full unable to leave message.

## 2022-03-05 ENCOUNTER — Other Ambulatory Visit: Payer: Self-pay | Admitting: Family Medicine

## 2022-03-05 DIAGNOSIS — G8929 Other chronic pain: Secondary | ICD-10-CM

## 2022-03-10 ENCOUNTER — Ambulatory Visit (INDEPENDENT_AMBULATORY_CARE_PROVIDER_SITE_OTHER): Payer: Medicare Other | Admitting: Primary Care

## 2022-03-10 ENCOUNTER — Encounter: Payer: Self-pay | Admitting: Primary Care

## 2022-03-10 VITALS — BP 132/68 | HR 65 | Temp 97.2°F | Ht 66.0 in | Wt 188.0 lb

## 2022-03-10 DIAGNOSIS — J9601 Acute respiratory failure with hypoxia: Secondary | ICD-10-CM | POA: Diagnosis not present

## 2022-03-10 DIAGNOSIS — J449 Chronic obstructive pulmonary disease, unspecified: Secondary | ICD-10-CM

## 2022-03-10 DIAGNOSIS — J9602 Acute respiratory failure with hypercapnia: Secondary | ICD-10-CM | POA: Diagnosis not present

## 2022-03-10 NOTE — Assessment & Plan Note (Addendum)
-   Stable; O2 95% RA. She can not walk far enough to qualify for supplemental oxygen and does not want to be started on oxygen

## 2022-03-10 NOTE — Progress Notes (Signed)
$'@Patient'm$  ID: Destiny Hughes, female    DOB: 1946-03-05, 76 y.o.   MRN: [id number]  Chief Complaint  Patient presents with   Follow-up    SOB Wheezing     Referring provider: Billie Ruddy, MD  HPI: 76 year old female, former smoker.  Past medical history significant for hypertension, A-fib, diastolic heart failure, COPD, emphysema, GERD, diabetes.  Patient Dr. [name]  Previous [facility] pulmonary encounter: 01/27/2022 Patient presents today for follow-up COPD. Dyspnea symptoms have been worse last 3 months.  She is using St[place]to more than prescribed, does not feel albuterol is helpful. She gets out of breath walking from the house to the car. She does not get much activity. She uses wheelchair to get around most of the time outside the home. She has some wheezing symptoms. No cough or fever.   COPD (chronic obstructive pulmonary disease) ([place]) - Patient has severe obstructive airway disease. Increased symptom burden last 3 months. No acute  symptoms. No wheezing on exam. Recommend trial Breztri Aersophere two puffs twice daily and sending in RX for ipratropium-albuterol nebulizer to use every 6 hours as needed for shortness of breath/wheezing. FU in 2-4 weeks with Dr. [name] or [facility] NP.   Acute respiratory failure with hypoxia and hypercapnia (HCC) - Stable; O2 94% room air. She can not walk far enough to qualify for supplemental oxygen and does not want to be started on oxygen   03/10/2022- Interim hx  Patient presents today for 4-6 week follow-up. Hx severe COPD. During her last visit in November she reported worsening dyspnea symptoms over the last 3 months. She had no acute respiratory symptoms at that time. We changed her from [employer] to [place]. She is doing well today. She reports improvement in breathing. She is able to do more. Feels she needs to use additional inhaler mid-afternoon. Albuterol rescue inhaler is not as effective as Libr[employer], academic. She has not tried using nebulizer. She  does have some pain on her right side but states that it is not bad and declined chest xray today.  No cough or sputum production.   Allergies  Allergen Reactions   Albuterol Shortness Of Breath and Other (See Comments)   Penicillins Anaphylaxis, Swelling and Other (See Comments)    Has patient had a PCN reaction causing immediate rash, facial/tongue/throat swelling, SOB or lightheadedness with hypotension: Yes Has patient had a PCN reaction causing severe rash involving mucus membranes or skin necrosis: Rash Has patient had a PCN reaction that required hospitalization: No Has patient had a PCN reaction occurring within the last 10 years: No If all of the above answers are "NO", then may proceed with Cephalosporin use.   Sulfa Antibiotics Swelling and Other (See Comments)    Throat swells, but no shortness of breath noted   Codeine Nausea And Vomiting   Ecotrin [Aspirin] Nausea And Vomiting   Zanaflex [Tizanidine] Other (See Comments)    Possible confusion    Immunization History  Administered Date(s) Administered   Fluad Quad(high Dose 65+) 12/17/2018, 12/23/2019, 12/24/2020, 03/01/2022   Influenza, High Dose Seasonal PF 04/13/2017, 12/05/2017   PFIZER(Purple Top)SARS-COV-2 Vaccination 04/26/2019, 05/21/2019, 01/18/2020   Pneumococcal Conjugate-13 10/03/2014   Pneumococcal Polysaccharide-23 10/18/2011   Td 03/21/2010    Past Medical History:  Diagnosis Date   Arthritis    OA- knees & back  neck   Chronic back pain    Cognitive retention disorder    Collapse of right lung    COPD (chronic obstructive pulmonary disease) ([place])  Hx. Bronchitis occ, 01-25-11 somes issue now taking  Z-pak-started 01-24-11/Scar tissue  present  from previous lung collapse   Dementia with psychosis ([place])    Depression    Depression    Diabetes mellitus ([place]) 07/08/2020   Dyspnea    Dysrhythmia 2018   treated with Ablation    GERD (gastroesophageal reflux disease)    tx. TUMS   Glaucoma     Headache    History of kidney stones    surgically treated   Hyperlipemia    Hypertension    Hypothyroidism    tx. Levothyroxine   Major depression with psychotic features ([place])    Nephrolithiasis    Neuromuscular disorder ([place]) 01-25-11   Pain/nerve stimulator implanted -2 yrs ago.   Reflex, gag, absent    Reflex, gag, absent     Tobacco History: Social History   Tobacco Use  Smoking Status Former   Packs/day: 0.75   Years: 50.00   Total pack years: 37.50   Types: Cigarettes   Start date: 09/13/1961   Quit date: 05/2016   Years since quitting: 5.8  Smokeless Tobacco Never  Tobacco Comments   Decreased Down to 2/3 a Day if Thinking [employer]ou[employer] It   Counseling given: Not Answered Tobacco comments: Decreased Down to 2/3 a Day if Thinking [employer]ouit It   Outpatient Medications Prior to Visit  Medication Sig Dispense Refill   acetaminophen (TYLENOL) 500 MG tablet Take 1,000 mg by mouth every 6 (six) hours as needed for moderate pain.     albuterol (VENTOLIN HFA) 108 (90 Base) MCG/ACT inhaler Inhale 2 puffs into the lungs every 6 (six) hours as needed for wheezing or shortness of breath. 8.5 g 6   amLODipine (NORVASC) 5 MG tablet Take 1 tablet (5 mg total) by mouth daily. SCHEDULE APPT FOR FUTURE REFILLS 90 tablet 0   Budeson-Glycopyrrol-Formoterol (BREZTRI AEROSPHERE) 160-9-4.8 MCG/ACT AERO Inhale 2 puffs into the lungs in the morning and at bedtime. 10.7 g 5   buPROPion (WELLBUTRIN XL) 150 MG 24 hr tablet Take 1 tablet (150 mg total) by mouth every morning. 3  qam 90 tablet 6   cyclobenzaprine (FLEXERIL) 5 MG tablet TAKE ONE-HALF TABLET DAILY AS NEEDED FOR MUSCLE SPASM 30 tablet 0   diphenhydrAMINE (BENADRYL) 25 MG tablet Take 25 mg by mouth in the morning.     donepezil (ARICEPT) 23 MG TABS tablet 1  qhs 30 tablet 5   gabapentin (NEURONTIN) 100 MG capsule TAKE ONE CAPSULE TWICE A DAY 180 capsule 0   ipratropium (ATROVENT) 0.03 % nasal spray PLACE 2 SPRAYS INTO BOTH NOSTRILS EVERY 12  HOURS 30 mL 2   ipratropium-albuterol (DUONEB) 0.5-2.5 (3) MG/3ML SOLN Take 3 mLs by nebulization every 6 (six) hours as needed. 360 mL 0   levothyroxine (SYNTHROID) 125 MCG tablet Take 1 tablet (125 mcg total) by mouth daily. 90 tablet 1   lisinopril (ZESTRIL) 5 MG tablet TAKE ONE TABLET BY MOUTH ONCE DAILY 90 tablet 1   melatonin 5 MG TABS Take 5 mg by mouth at bedtime.     meloxicam (MOBIC) 7.5 MG tablet Take 1 tablet (7.5 mg total) by mouth daily. 20 tablet 0   metFORMIN (GLUCOPHAGE) 500 MG tablet TAKE ONE TABLET TWICE DAILY with a meal 180 tablet 0   pantoprazole (PROTONIX) 40 MG tablet TAKE ONE TABLET EVERY DAY 90 tablet 0   propranolol (INDERAL) 80 MG tablet Take 1 tablet (80 mg total) by mouth 3 (three) times daily. SCHEDULE APPT FOR FUTURE  REFILLS 135 tablet 0   rosuvastatin (CRESTOR) 5 MG tablet TAKE ONE TABLET EVERY DAY 90 tablet 0   sodium chloride 1 g tablet TAKE ONE TABLET THREE TIMES DAILY 180 tablet 1   temazepam (RESTORIL) 30 MG capsule Take 1 capsule (30 mg total) by mouth at bedtime. 30 capsule 5   fluticasone (FLONASE) 50 MCG/ACT nasal spray TWO SPRAYS IN EACH NOSTRIL ONCE DAILY (Patient not taking: Reported on 03/10/2022) 16 g 2   predniSONE (DELTASONE) 10 MG tablet Take 2 tablets (20 mg total) by mouth daily with breakfast. (Patient not taking: Reported on 03/10/2022) 10 tablet 0   No facility-administered medications prior to visit.    Review of Systems  Review of Systems  Constitutional: Negative.   HENT: Negative.    Respiratory: Negative.  Negative for cough, chest tightness, shortness of breath and wheezing.        DOE   Physical Exam  BP 132/68 (BP Location: Right Arm, Patient Position: Sitting, Cuff Size: Large)   Pulse 65   Temp (!) 97.2 F (36.2 C) (Temporal)   Ht '5\' 6"'$  (1.676 m)   Wt 188 lb (85.3 kg)   SpO2 95%   BMI 30.34 kg/m  Physical Exam Constitutional:      Appearance: Normal appearance.  HENT:     Head: Normocephalic and atraumatic.      Mouth/Throat:     Mouth: Mucous membranes are moist.     Pharynx: Oropharynx is clear.  Cardiovascular:     Rate and Rhythm: Normal rate and regular rhythm.  Pulmonary:     Effort: Pulmonary effort is normal.     Breath sounds: Normal breath sounds.  Neurological:     General: No focal deficit present.     Mental Status: She is alert and oriented to person, place, and time. Mental status is at baseline.  Psychiatric:        Mood and Affect: Mood normal.        Behavior: Behavior normal.        Thought Content: Thought content normal.        Judgment: Judgment normal.      Lab Results:  CBC    Component Value Date/Time   WBC 8.1 07/31/2020 0348   RBC 3.54 (L) 07/31/2020 0348   HGB 10.6 (L) 07/31/2020 0348   HGB 12.8 08/16/2017 1600   HCT 31.3 (L) 07/31/2020 0348   HCT 38.2 08/16/2017 1600   PLT 269 07/31/2020 0348   PLT 363 08/16/2017 1600   MCV 88.4 07/31/2020 0348   MCV 88 08/16/2017 1600   MCH 29.9 07/31/2020 0348   MCHC 33.9 07/31/2020 0348   RDW 13.3 07/31/2020 0348   RDW 14.5 08/16/2017 1600   LYMPHSABS 1,770 09/30/2019 1220   LYMPHSABS 2.1 08/16/2017 1600   MONOABS 0.5 02/21/2019 1441   EOSABS 118 09/30/2019 1220   EOSABS 0.3 08/16/2017 1600   BASOSABS 22 09/30/2019 1220   BASOSABS 0.0 08/16/2017 1600    BMET    Component Value Date/Time   NA 129 (L) 07/30/2020 0234   NA 126 (L) 01/03/2018 1400   K 4.2 07/30/2020 0234   CL 90 (L) 07/30/2020 0234   CO2 32 07/30/2020 0234   GLUCOSE 238 (H) 07/30/2020 0234   BUN 8 07/30/2020 0234   BUN 14 01/03/2018 1400   CREATININE 0.92 07/30/2020 0234   CREATININE 1.05 (H) 03/02/2020 0900   CALCIUM 8.6 (L) 07/30/2020 0234   GFRNONAA >60 07/30/2020 0234   GFRNONAA  52 (L) 03/02/2020 0900   GFRAA 60 03/02/2020 0900    BNP    Component Value Date/Time   BNP 57 09/30/2019 1220    ProBNP    Component Value Date/Time   PROBNP 166.0 (H) 02/14/2018 1104    Imaging: No results found.   Assessment & Plan:    COPD (chronic obstructive pulmonary disease) ([place]) - Changed from [place] to [place] in November with clinical benefit. CAT score 10.  - Advised she use ipratropium-albuterol nebulizer daily at 2pm  - FU in 3 months with Dr. [name] or sooner if needed   Acute respiratory failure with hypoxia and hypercapnia (HCC) - Stable; O2 95% RA. She can not walk far enough to qualify for supplemental oxygen and does not want to be started on oxygen    Martyn Ehrich, NP 03/10/2022

## 2022-03-10 NOTE — Addendum Note (Signed)
Addended by: June Leap on: 03/10/2022 10:29 AM   Modules accepted: Orders

## 2022-03-10 NOTE — Patient Instructions (Addendum)
Recommendations: - Take two puffs Breztri Aerosphere every 12 hours (rinse mouth after use) - Use Ipratropium-albuterol nebulizer daily at 2pm  - Use incentive spirometer 10/hours while awake to encourage deep breathing   Follow-up: - 3 months with Dr. [name]

## 2022-03-10 NOTE — Assessment & Plan Note (Addendum)
-   Changed from [place] to [place] in November with clinical benefit. CAT score 10.  - Advised she use ipratropium-albuterol nebulizer daily at 2pm  - FU in 3 months with Dr. [name] or sooner if needed

## 2022-03-15 ENCOUNTER — Ambulatory Visit (INDEPENDENT_AMBULATORY_CARE_PROVIDER_SITE_OTHER): Payer: Medicare Other

## 2022-03-15 VITALS — Ht 66.0 in | Wt 188.0 lb

## 2022-03-15 DIAGNOSIS — Z0000 Encounter for general adult medical examination without abnormal findings: Secondary | ICD-10-CM | POA: Diagnosis not present

## 2022-03-15 NOTE — Patient Instructions (Addendum)
Destiny Hughes , Thank you for taking time to come for your Medicare Wellness Visit. I appreciate your ongoing commitment to your health goals. Please review the following plan we discussed and let me know if I can assist you in the future.   These are the goals we discussed:  Goals       Exercise 3x per week (30 min per time)      Manage My Medicine      Timeframe:  Long-Range Goal Priority:  Medium Start Date:                             Expected End Date:                       Follow Up Date 07/26/21    - keep a list of all the medicines I take; vitamins and herbals too - learn to read medicine labels - use a pillbox to sort medicine    Why is this important?   These steps will help you keep on track with your medicines.   Notes:       Patient Stated      03/01/2021, wants to be able to walk and breathe better      Stay Healthy (pt-stated)      Track and Manage My Blood Pressure-Hypertension      Timeframe:  Long-Range Goal Priority:  High Start Date:                             Expected End Date:                       Follow Up Date 06/26/21    - check blood pressure weekly - choose a place to take my blood pressure (home, clinic or office, retail store) - write blood pressure results in a log or diary    Why is this important?   You won't feel high blood pressure, but it can still hurt your blood vessels.  High blood pressure can cause heart or kidney problems. It can also cause a stroke.  Making lifestyle changes like losing a little weight or eating less salt will help.  Checking your blood pressure at home and at different times of the day can help to control blood pressure.  If the doctor prescribes medicine remember to take it the way the doctor ordered.  Call the office if you cannot afford the medicine or if there are questions about it.     Notes:         This is a list of the screening recommended for you and due dates:  Health Maintenance  Topic Date  Due   Complete foot exam   Never done   Eye exam for diabetics  Never done   Yearly kidney health urinalysis for diabetes  05/22/2015   DTaP/Tdap/Td vaccine (2 - Tdap) 03/21/2020   Hemoglobin A1C  01/07/2021   Yearly kidney function blood test for diabetes  07/30/2021   COVID-19 Vaccine (4 - 2023-24 season) 03/31/2022*   Zoster (Shingles) Vaccine (1 of 2) 06/14/2022*   DEXA scan (bone density measurement)  03/16/2023*   Screening for Lung Cancer  04/30/2022   Medicare Annual Wellness Visit  03/16/2023   Pneumonia Vaccine  Completed   Flu Shot  Completed   Hepatitis C Screening: USPSTF Recommendation  to screen - Ages 47-79 yo.  Completed   HPV Vaccine  Aged Out   Colon Cancer Screening  Discontinued  *Topic was postponed. The date shown is not the original due date.    Advanced directives: Please bring a copy of your health care power of attorney and living will to the office to be added to your chart at your convenience.   Conditions/risks identified: None  Next appointment: Follow up in one year for your annual wellness visit     Preventive Care 65 Years and Older, Female Preventive care refers to lifestyle choices and visits with your health care provider that can promote health and wellness. What does preventive care include? A yearly physical exam. This is also called an annual well check. Dental exams once or twice a year. Routine eye exams. Ask your health care provider how often you should have your eyes checked. Personal lifestyle choices, including: Daily care of your teeth and gums. Regular physical activity. Eating a healthy diet. Avoiding tobacco and drug use. Limiting alcohol use. Practicing safe sex. Taking low-dose aspirin every day. Taking vitamin and mineral supplements as recommended by your health care provider. What happens during an annual well check? The services and screenings done by your health care provider during your annual well check will  depend on your age, overall health, lifestyle risk factors, and family history of disease. Counseling  Your health care provider may ask you questions about your: Alcohol use. Tobacco use. Drug use. Emotional well-being. Home and relationship well-being. Sexual activity. Eating habits. History of falls. Memory and ability to understand (cognition). Work and work Statistician. Reproductive health. Screening  You may have the following tests or measurements: Height, weight, and BMI. Blood pressure. Lipid and cholesterol levels. These may be checked every 5 years, or more frequently if you are over 74 years old. Skin check. Lung cancer screening. You may have this screening every year starting at age 91 if you have a 30-pack-year history of smoking and currently smoke or have quit within the past 15 years. Fecal occult blood test (FOBT) of the stool. You may have this test every year starting at age 26. Flexible sigmoidoscopy or colonoscopy. You may have a sigmoidoscopy every 5 years or a colonoscopy every 10 years starting at age 97. Hepatitis C blood test. Hepatitis B blood test. Sexually transmitted disease (STD) testing. Diabetes screening. This is done by checking your blood sugar (glucose) after you have not eaten for a while (fasting). You may have this done every 1-3 years. Bone density scan. This is done to screen for osteoporosis. You may have this done starting at age 68. Mammogram. This may be done every 1-2 years. Talk to your health care provider about how often you should have regular mammograms. Talk with your health care provider about your test results, treatment options, and if necessary, the need for more tests. Vaccines  Your health care provider may recommend certain vaccines, such as: Influenza vaccine. This is recommended every year. Tetanus, diphtheria, and acellular pertussis (Tdap, Td) vaccine. You may need a Td booster every 10 years. Zoster vaccine. You may  need this after age 59. Pneumococcal 13-valent conjugate (PCV13) vaccine. One dose is recommended after age 27. Pneumococcal polysaccharide (PPSV23) vaccine. One dose is recommended after age 34. Talk to your health care provider about which screenings and vaccines you need and how often you need them. This information is not intended to replace advice given to you by your health care provider.  Make sure you discuss any questions you have with your health care provider. Document Released: 04/03/2015 Document Revised: 11/25/2015 Document Reviewed: 01/06/2015 Elsevier Interactive Patient Education  2017 [place] Prevention in the Home Falls can cause injuries. They can happen to people of all ages. There are many things you can do to make your home safe and to help prevent falls. What can I do on the outside of my home? Regularly fix the edges of walkways and driveways and fix any cracks. Remove anything that might make you trip as you walk through a door, such as a raised step or threshold. Trim any bushes or trees on the path to your home. Use bright outdoor lighting. Clear any walking paths of anything that might make someone trip, such as rocks or tools. Regularly check to see if handrails are loose or broken. Make sure that both sides of any steps have handrails. Any raised decks and porches should have guardrails on the edges. Have any leaves, snow, or ice cleared regularly. Use sand or salt on walking paths during winter. Clean up any spills in your garage right away. This includes oil or grease spills. What can I do in the bathroom? Use night lights. Install grab bars by the toilet and in the tub and shower. Do not use towel bars as grab bars. Use non-skid mats or decals in the tub or shower. If you need to sit down in the shower, use a plastic, non-slip stool. Keep the floor dry. Clean up any water that spills on the floor as soon as it happens. Remove soap buildup in  the tub or shower regularly. Attach bath mats securely with double-sided non-slip rug tape. Do not have throw rugs and other things on the floor that can make you trip. What can I do in the bedroom? Use night lights. Make sure that you have a light by your bed that is easy to reach. Do not use any sheets or blankets that are too big for your bed. They should not hang down onto the floor. Have a firm chair that has side arms. You can use this for support while you get dressed. Do not have throw rugs and other things on the floor that can make you trip. What can I do in the kitchen? Clean up any spills right away. Avoid walking on wet floors. Keep items that you use a lot in easy-to-reach places. If you need to reach something above you, use a strong step stool that has a grab bar. Keep electrical cords out of the way. Do not use floor polish or wax that makes floors slippery. If you must use wax, use non-skid floor wax. Do not have throw rugs and other things on the floor that can make you trip. What can I do with my stairs? Do not leave any items on the stairs. Make sure that there are handrails on both sides of the stairs and use them. Fix handrails that are broken or loose. Make sure that handrails are as long as the stairways. Check any carpeting to make sure that it is firmly attached to the stairs. Fix any carpet that is loose or worn. Avoid having throw rugs at the top or bottom of the stairs. If you do have throw rugs, attach them to the floor with carpet tape. Make sure that you have a light switch at the top of the stairs and the bottom of the stairs. If you do not have them, ask  someone to add them for you. What else can I do to help prevent falls? Wear shoes that: Do not have high heels. Have rubber bottoms. Are comfortable and fit you well. Are closed at the toe. Do not wear sandals. If you use a stepladder: Make sure that it is fully opened. Do not climb a closed  stepladder. Make sure that both sides of the stepladder are locked into place. Ask someone to hold it for you, if possible. Clearly mark and make sure that you can see: Any grab bars or handrails. First and last steps. Where the edge of each step is. Use tools that help you move around (mobility aids) if they are needed. These include: Canes. Walkers. Scooters. Crutches. Turn on the lights when you go into a dark area. Replace any light bulbs as soon as they burn out. Set up your furniture so you have a clear path. Avoid moving your furniture around. If any of your floors are uneven, fix them. If there are any pets around you, be aware of where they are. Review your medicines with your doctor. Some medicines can make you feel dizzy. This can increase your chance of falling. Ask your doctor what other things that you can do to help prevent falls. This information is not intended to replace advice given to you by your health care provider. Make sure you discuss any questions you have with your health care provider. Document Released: 01/01/2009 Document Revised: 08/13/2015 Document Reviewed: 04/11/2014 Elsevier Interactive Patient Education  2017 [employer].

## 2022-03-15 NOTE — Progress Notes (Signed)
Subjective:   Destiny Hughes is a 76 y.o. female who presents for Medicare Annual (Subsequent) preventive examination.  Review of Systems    Virtual Visit via Telephone Note  I connected with  Destiny Hughes on 03/15/22 at  1:00 PM EST by telephone and verified that I am speaking with the correct person using two identifiers.  Location: Patient: Home Provider: Office Persons participating in the virtual visit: patient/Nurse Health Advisor   I discussed the limitations, risks, security and privacy concerns of performing an evaluation and management service by telephone and the availability of in person appointments. The patient expressed understanding and agreed to proceed.  Interactive audio and video telecommunications were attempted between this nurse and patient, however failed, due to patient having technical difficulties OR patient did not have access to video capability.  We continued and completed visit with audio only.  Some vital signs may be absent or patient reported.   Destiny Peaches, LPN  Cardiac Risk Factors include: advanced age (>92mn, >>45women);hypertension     Objective:    Today's Vitals   03/15/22 1313  Weight: 188 lb (85.3 kg)  Height: _0  (1.676 m)   Body mass index is 30.34 kg/m.     03/01/2021   10:44 AM 07/29/2020    8:08 PM 07/27/2020    3:27 PM 06/04/2020    9:29 AM 02/27/2020   10:54 AM 06/04/2018    4:00 PM 06/04/2018   10:12 AM  Advanced Directives  Does Patient Have a Medical Advance Directive? Yes Yes No No Yes Yes Yes  Type of AParamedicof AFort FetterLiving will Healthcare Power of AUpper StewartsvilleLiving will HGolden ValleyLiving will HSt. Regis ParkLiving will  Does patient want to make changes to medical advance directive?  No - Patient declined   No - Patient declined No - Patient declined No - Patient declined  Copy of [place] in Chart? No - copy requested    No - copy requested No - copy requested No - copy requested  Would patient like information on creating a medical advance directive?   No - Patient declined No - Patient declined       Current Medications (verified) Outpatient Encounter Medications as of 03/15/2022  Medication Sig   acetaminophen (TYLENOL) 500 MG tablet Take 1,000 mg by mouth every 6 (six) hours as needed for moderate pain.   albuterol (VENTOLIN HFA) 108 (90 Base) MCG/ACT inhaler Inhale 2 puffs into the lungs every 6 (six) hours as needed for wheezing or shortness of breath.   amLODipine (NORVASC) 5 MG tablet Take 1 tablet (5 mg total) by mouth daily. SCHEDULE APPT FOR FUTURE REFILLS   Budeson-Glycopyrrol-Formoterol (BREZTRI AEROSPHERE) 160-9-4.8 MCG/ACT AERO Inhale 2 puffs into the lungs in the morning and at bedtime.   buPROPion (WELLBUTRIN XL) 150 MG 24 hr tablet Take 1 tablet (150 mg total) by mouth every morning. 3  qam   cyclobenzaprine (FLEXERIL) 5 MG tablet TAKE ONE-HALF TABLET DAILY AS NEEDED FOR MUSCLE SPASM   diphenhydrAMINE (BENADRYL) 25 MG tablet Take 25 mg by mouth in the morning.   donepezil (ARICEPT) 23 MG TABS tablet 1  qhs   fluticasone (FLONASE) 50 MCG/ACT nasal spray TWO SPRAYS IN EACH NOSTRIL ONCE DAILY (Patient not taking: Reported on 03/10/2022)   gabapentin (NEURONTIN) 100 MG capsule TAKE ONE CAPSULE TWICE A DAY   ipratropium (ATROVENT) 0.03 % nasal spray PLACE 2  SPRAYS INTO BOTH NOSTRILS EVERY 12 HOURS   ipratropium-albuterol (DUONEB) 0.5-2.5 (3) MG/3ML SOLN Take 3 mLs by nebulization every 6 (six) hours as needed.   levothyroxine (SYNTHROID) 125 MCG tablet Take 1 tablet (125 mcg total) by mouth daily.   lisinopril (ZESTRIL) 5 MG tablet TAKE ONE TABLET BY MOUTH ONCE DAILY   melatonin 5 MG TABS Take 5 mg by mouth at bedtime.   meloxicam (MOBIC) 7.5 MG tablet Take 1 tablet (7.5 mg total) by mouth daily.   metFORMIN (GLUCOPHAGE) 500 MG tablet TAKE ONE TABLET TWICE DAILY  with a meal   pantoprazole (PROTONIX) 40 MG tablet TAKE ONE TABLET EVERY DAY   predniSONE (DELTASONE) 10 MG tablet Take 2 tablets (20 mg total) by mouth daily with breakfast. (Patient not taking: Reported on 03/10/2022)   propranolol (INDERAL) 80 MG tablet Take 1 tablet (80 mg total) by mouth 3 (three) times daily. SCHEDULE APPT FOR FUTURE REFILLS   rosuvastatin (CRESTOR) 5 MG tablet TAKE ONE TABLET EVERY DAY   sodium chloride 1 g tablet TAKE ONE TABLET THREE TIMES DAILY   temazepam (RESTORIL) 30 MG capsule Take 1 capsule (30 mg total) by mouth at bedtime.   No facility-administered encounter medications on file as of 03/15/2022.    Allergies (verified) Albuterol, Penicillins, Sulfa antibiotics, Codeine, Ecotrin [aspirin], and Zanaflex [tizanidine]   History: Past Medical History:  Diagnosis Date   Arthritis    OA- knees & back  neck   Chronic back pain    Cognitive retention disorder    Collapse of right lung    COPD (chronic obstructive pulmonary disease) (HCC)    Hx. Bronchitis occ, 01-25-11 somes issue now taking  Z-pak-started 01-24-11/Scar tissue  present  from previous lung collapse   Dementia with psychosis ([place])    Depression    Depression    Diabetes mellitus ([facility]) 07/08/2020   Dyspnea    Dysrhythmia 2018   treated with Ablation    GERD (gastroesophageal reflux disease)    tx. TUMS   Glaucoma    Headache    History of kidney stones    surgically treated   Hyperlipemia    Hypertension    Hypothyroidism    tx. Levothyroxine   Major depression with psychotic features ([place])    Nephrolithiasis    Neuromuscular disorder ([place]) 01-25-11   Pain/nerve stimulator implanted -2 yrs ago.   Reflex, gag, absent    Reflex, gag, absent    Past Surgical History:  Procedure Laterality Date   A-FLUTTER ABLATION N/A 08/16/2016   Procedure: A-Flutter Ablation;  Surgeon: Evans Lance, MD;  Location: [facility];  Service: Cardiovascular;  Laterality: N/A;   ANTERIOR  CERVICAL DECOMP/DISCECTOMY FUSION     3 levels, per family   APPENDECTOMY  01-25-11   CARPAL TUNNEL RELEASE  01-25-11   [place].   CERVICAL SPINE SURGERY  01-25-11   2005-multiple levels   CHOLECYSTECTOMY  01-25-11   '07   COLONOSCOPY WITH PROPOFOL N/A 07/23/2012   Procedure: COLONOSCOPY WITH PROPOFOL;  Surgeon: Garlan Fair, MD;  Location: [facility];  Service: Endoscopy;  Laterality: N/A;   detatched retna Bilateral    done July & August- 2019   EYE SURGERY Bilateral 2019   repair of retina detachment- Dr. Zadie Rhine at surgical center    JOINT REPLACEMENT  01-25-11   6'03 [place]  01-25-11   '04-right, revised x2   no gag reflex     Pt does not havea  gag reflex following  siurgery to neck. Family unsure of date. Pt takes pill in apple sauce.   SPINAL CORD STIMULATOR BATTERY EXCHANGE N/A 01/12/2018   Procedure: Spinal cord stimulator battery replacement;  Surgeon: Clydell Hakim, MD;  Location: [facility];  Service: Neurosurgery;  Laterality: N/A;  left   SPINAL CORD STIMULATOR IMPLANT  2009 APPROX   DR. ELSNER   SPINAL CORD STIMULATOR REMOVAL N/A 06/04/2018   Procedure: Removal of spinal cord stimulator, thoracic paddle and generator;  Surgeon: Kristeen Miss, MD;  Location: [facility];  Service: Neurosurgery;  Laterality: N/A;   THORACIC SPINE SURGERY  01-25-11   6'02- then nerve stimulator implanted after   TOTAL HIP ARTHROPLASTY Right 07/29/2020   Procedure: RIGHT TOTAL HIP ARTHROPLASTY ANTERIOR APPROACH;  Surgeon: Marybelle Killings, MD;  Location: [facility];  Service: Orthopedics;  Laterality: Right;   TOTAL KNEE REVISION  02/01/2011   Procedure: TOTAL KNEE REVISION;  Surgeon: Mauri Pole;  Location: [facility];  Service: Orthopedics;  Laterality: Right;   TUBAL LIGATION     Family History  Problem Relation Age of Onset   Heart attack Father    Emphysema Father    Aneurysm Mother        CEREBRAL   Emphysema Mother    Dementia Mother    Diabetes Son    Hypertension Son     Hypertension Son    Cancer Sister        Widely Metastatic   Asthma Son        x2   Breast cancer Paternal Aunt    Rheum arthritis Maternal Aunt    Social History   Socioeconomic History   Marital status: Widowed    Spouse name: Not on file   Number of children: 2   Years of education: Not on file   Highest education level: Not on file  Occupational History   Occupation: disabled  Tobacco Use   Smoking status: Former    Packs/day: 0.75    Years: 50.00    Total pack years: 37.50    Types: Cigarettes    Start date: 09/13/1961    Quit date: 05/2016    Years since quitting: 5.8   Smokeless tobacco: Never   Tobacco comments:    Decreased Down to 2/3 a Day if Thinking Abouit It  Vaping Use   Vaping Use: Never used  Substance and Sexual Activity   Alcohol use: No    Alcohol/week: 0.0 standard drinks of alcohol    Comment: none in 10 years   Drug use: No   Sexual activity: Not Currently  Other Topics Concern   Not on file  Social History Narrative   She is from [place]. She has always lived in [place]. She has traveled to [place], [place], [place], [place], & [place]. 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 Prior exposure to parakeets with last exposure remote in her current home. Previously had mold in her home that was in her bathroom & fixed. Prior exposure to hot tubs but none recently. Pt lives at home with [name], husband.  Has 2 childrean, 12th grade education.     Social Determinants of Health   Financial Resource Strain: Low Risk  (03/15/2022)   Overall Financial Resource Strain (CARDIA)    Difficulty of Paying Living Expenses: Not hard at all  Food Insecurity: No Food Insecurity (03/15/2022)  Hunger Vital Sign    Worried About Running Out of Food in the Last Year: Never true    Ran Out of Food in the Last Year: Never true  Transportation Needs: No Transportation Needs  (03/15/2022)   PRAPARE - Hydrologist (Medical): No    Lack of Transportation (Non-Medical): No  Physical Activity: Inactive (03/15/2022)   Exercise Vital Sign    Days of Exercise per Week: 0 days    Minutes of Exercise per Session: 0 min  Stress: No Stress Concern Present (03/15/2022)   [place]    Feeling of Stress : Not at all  Social Connections: Moderately Isolated (03/15/2022)   Social Connection and Isolation Panel [NHANES]    Frequency of Communication with Friends and Family: More than three times a week    Frequency of Social Gatherings with Friends and Family: More than three times a week    Attends Religious Services: Never    Marine scientist or Organizations: Yes    Attends Archivist Meetings: Never    Marital Status: Widowed    Tobacco Counseling Counseling given: Not Answered Tobacco comments: Decreased Down to 2/3 a Day if Thinking Abouit It   Clinical Intake:  Pre-visit preparation completed: No  Pain : No/denies pain     BMI - recorded: 30.34 Nutritional Status: BMI > 30  Obese Nutritional Risks: None Diabetes: No  How often do you need to have someone help you when you read instructions, pamphlets, or other written materials from your doctor or pharmacy?: 1 - Never  Diabetic?  No  Interpreter Needed?: No  Information entered by :: Rolene Arbour LPN   Activities of Daily Living    03/15/2022    1:19 PM  In your present state of health, do you have any difficulty performing the following activities:  Hearing? 0  Vision? 0  Difficulty concentrating or making decisions? 0  Walking or climbing stairs? 0  Dressing or bathing? 0  Doing errands, shopping? 0  Preparing Food and eating ? N  Using the Toilet? N  In the past six months, have you accidently leaked urine? N  Do you have problems with loss of bowel control? N  Managing  your Medications? N  Managing your Finances? N  Housekeeping or managing your Housekeeping? N    Patient Care Team: Billie Ruddy, MD as PCP - General (Family Medicine) Evans Lance, MD as PCP - Cardiology (Cardiology) Paralee Cancel, MD as Consulting Physician (Orthopedic Surgery) Darlin Coco, MD as Consulting Physician (Cardiology) Garlan Fair, MD as Consulting Physician (Gastroenterology) Nicholaus Bloom, MD (Anesthesiology) Viona Gilmore, [facility] as Pharmacist (Pharmacist)  Indicate any recent Medical Services you may have received from other than [facility] providers in the past year (date may be approximate).     Assessment:   This is a routine wellness examination for Clementina.  Hearing/Vision screen Hearing Screening - Comments:: Denies hearing difficulties   Vision Screening - Comments:: Wears rx glasses - up to date with routine eye exams with  Dr Simonne Come  Dietary issues and exercise activities discussed: Exercise limited by: None identified   Goals Addressed               This Visit's Progress     Stay Healthy (pt-stated)         Depression Screen    03/08/2021    9:34 AM 03/01/2021   10:46  AM 02/27/2020   10:58 AM 07/26/2016   10:22 AM 07/15/2016    4:39 PM 04/28/2015    9:38 PM 10/03/2014    1:02 PM  PHQ 2/9 Scores  PHQ - 2 Score 3 1 0 0 0 0 2  PHQ- 9 Score 6  0    6    Fall Risk    03/15/2022    1:20 PM 03/08/2021    9:34 AM 03/01/2021   10:45 AM 02/27/2020   10:57 AM 07/26/2016   10:22 AM  Fall Risk   Falls in the past year? 0 0 0 0 Yes  Number falls in past yr: 0   0 2 or more  Injury with Fall? 0   0 Yes  Risk Factor Category      High Fall Risk  Risk for fall due to : No Fall Risks  Impaired balance/gait;Impaired mobility;Medication side effect History of fall(s);Impaired balance/gait History of fall(s);Impaired balance/gait;Impaired mobility;Impaired vision  Follow up Falls prevention discussed  Falls evaluation completed;Education  provided;Falls prevention discussed Falls evaluation completed;Falls prevention discussed Falls prevention discussed    FALL RISK PREVENTION PERTAINING TO THE HOME:  Any stairs in or around the home? No  If so, are there any without handrails? No  Home free of loose throw rugs in walkways, pet beds, electrical cords, etc? Yes  Adequate lighting in your home to reduce risk of falls? Yes   ASSISTIVE DEVICES UTILIZED TO PREVENT FALLS:  Life alert? No  Use of a cane, walker or w/c? Yes  Grab bars in the bathroom? No  Shower chair or bench in shower? No  Elevated toilet seat or a handicapped toilet? No   TIMED UP AND GO:  Was the test performed? No . Audio Visit   Cognitive Function:    08/22/2016    3:00 PM 07/01/2016   12:06 PM  MMSE - Mini Mental State Exam  Orientation to time 0 1  Orientation to Place 3 3  Registration 3 3  Attention/ Calculation 1 3  Recall 2 1  Language- name 2 objects 2 2  Language- repeat 1 1  Language- follow 3 step command 3 3  Language- read & follow direction 1 1  Write a sentence 1 1  Copy design 1 1  Total score 18 20        03/15/2022    1:21 PM 02/27/2020   11:02 AM  6CIT Screen  What Year? 0 points 0 points  What month? 0 points 0 points  What time? 0 points 0 points  Count back from 20 0 points 0 points  Months in reverse 0 points 4 points  Repeat phrase 0 points 2 points  Total Score 0 points 6 points    Immunizations Immunization History  Administered Date(s) Administered   Fluad Quad(high Dose 65+) 12/17/2018, 12/23/2019, 12/24/2020, 03/01/2022   Influenza, High Dose Seasonal PF 04/13/2017, 12/05/2017   PFIZER(Purple Top)SARS-COV-2 Vaccination 04/26/2019, 05/21/2019, 01/18/2020   Pneumococcal Conjugate-13 10/03/2014   Pneumococcal Polysaccharide-23 10/18/2011   Td 03/21/2010    TDAP status: Due, Education has been provided regarding the importance of this vaccine. Advised may receive this vaccine at local pharmacy or  Health Dept. Aware to provide a copy of the vaccination record if obtained from local pharmacy or Health Dept. Verbalized acceptance and understanding.  Flu Vaccine status: Up to date  Pneumococcal vaccine status: Up to date  Covid-19 vaccine status: Completed vaccines  Qualifies for Shingles Vaccine? Yes   Zostavax completed  No   Shingrix Completed?: No.    Education has been provided regarding the importance of this vaccine. Patient has been advised to call insurance company to determine out of pocket expense if they have not yet received this vaccine. Advised may also receive vaccine at local pharmacy or Health Dept. Verbalized acceptance and understanding.  Screening Tests Health Maintenance  Topic Date Due   FOOT EXAM  Never done   OPHTHALMOLOGY EXAM  Never done   Diabetic kidney evaluation - Urine ACR  05/22/2015   DTaP/Tdap/Td (2 - Tdap) 03/21/2020   HEMOGLOBIN A1C  01/07/2021   Diabetic kidney evaluation - eGFR measurement  07/30/2021   COVID-19 Vaccine (4 - 2023-24 season) 03/31/2022 (Originally 11/19/2021)   Zoster Vaccines- Shingrix (1 of 2) 06/14/2022 (Originally 09/14/1964)   DEXA SCAN  03/16/2023 (Originally 09/13/2008)   Lung Cancer Screening  04/30/2022   Medicare Annual Wellness (AWV)  03/16/2023   Pneumonia Vaccine 45+ Years old  Completed   INFLUENZA VACCINE  Completed   Hepatitis C Screening  Completed   HPV VACCINES  Aged Out   COLONOSCOPY (Pts 45-65yr Insurance coverage will need to be confirmed)  Discontinued    Health Maintenance  Health Maintenance Due  Topic Date Due   FOOT EXAM  Never done   OPHTHALMOLOGY EXAM  Never done   Diabetic kidney evaluation - Urine ACR  05/22/2015   DTaP/Tdap/Td (2 - Tdap) 03/21/2020   HEMOGLOBIN A1C  01/07/2021   Diabetic kidney evaluation - eGFR measurement  07/30/2021    Colorectal cancer screening: No longer required.   Mammogram status: No longer required due to  .  Bone Density status: Ordered Patient deferred.  Pt provided with contact info and advised to call to schedule appt.  Lung Cancer Screening: (Low Dose CT Chest recommended if Age 78-80years, 30 pack-year currently smoking OR have quit w/in 15years.) does not qualify.     Additional Screening:  Hepatitis C Screening: does qualify; Completed 03/02/20  Vision Screening: Recommended annual ophthalmology exams for early detection of glaucoma and other disorders of the eye. Is the patient up to date with their annual eye exam?  Yes  Who is the provider or what is the name of the office in which the patient attends annual eye exams? Dr CSimonne ComeIf pt is not established with a provider, would they like to be referred to a provider to establish care? No .   Dental Screening: Recommended annual dental exams for proper oral hygiene  Community Resource Referral / Chronic Care Management:  CRR required this visit?  No   CCM required this visit?  No      Plan:     I have personally reviewed and noted the following in the patient's chart:   Medical and social history Use of alcohol, tobacco or illicit drugs  Current medications and supplements including opioid prescriptions. Patient is not currently taking opioid prescriptions. Functional ability and status Nutritional status Physical activity Advanced directives List of other physicians Hospitalizations, surgeries, and ER visits in previous 12 months Vitals Screenings to include cognitive, depression, and falls Referrals and appointments  In addition, I have reviewed and discussed with patient certain preventive protocols, quality metrics, and best practice recommendations. A written personalized care plan for preventive services as well as general preventive health recommendations were provided to patient.     BCriselda Peaches LPN   [id number]  Nurse Notes: Patient due Diabetic Kidney Evaluation ACR and eGFR measurement also Hemoglobin A1C

## 2022-03-23 ENCOUNTER — Other Ambulatory Visit: Payer: Self-pay | Admitting: Family Medicine

## 2022-03-23 DIAGNOSIS — E782 Mixed hyperlipidemia: Secondary | ICD-10-CM

## 2022-03-24 ENCOUNTER — Telehealth (HOSPITAL_COMMUNITY): Payer: Self-pay

## 2022-03-24 NOTE — Telephone Encounter (Signed)
Scott patients son called to report that she is not doing well since the passing of her husband she has trouble sleeping she has uncontrollable crying he is not sure if her medications needs  to be increased he wants to try to get someone to come in to be with her a few hours a day she does not want to go to any of the adult daycare programs she does have an appt on 04/12/22 but son is asking for advice. Please advise.

## 2022-04-12 ENCOUNTER — Ambulatory Visit (HOSPITAL_BASED_OUTPATIENT_CLINIC_OR_DEPARTMENT_OTHER): Payer: Medicare Other | Admitting: Psychiatry

## 2022-04-12 DIAGNOSIS — F320 Major depressive disorder, single episode, mild: Secondary | ICD-10-CM | POA: Diagnosis not present

## 2022-04-12 MED ORDER — BUPROPION HCL ER (XL) 150 MG PO TB24: 150.0000 mg | ORAL_TABLET | ORAL | 6 refills | Status: AC

## 2022-04-12 MED ORDER — DONEPEZIL HCL 23 MG PO TABS: ORAL_TABLET | ORAL | 5 refills | Status: DC

## 2022-04-12 MED ORDER — TEMAZEPAM 30 MG PO CAPS: ORAL_CAPSULE | ORAL | 5 refills | Status: DC

## 2022-04-12 NOTE — Progress Notes (Signed)
Psychiatric Initial Adult Assessment   Patient Identification: Destiny Hughes MRN:  [id number] Date of Evaluation:  04/12/2022 Referral Source: [facility] Chief Complaint: Major depression remission       Today the patient is doing fair.  She asked to be seen alone first.  Her son [name] brought her and came in at the end of the session.  The patient admits to being more and more depressed.  He is now up and about 2 months since Bill her husband died.  In some ways the patient is doing fairly well.  The family is there for her.  They have adjusted the house and gotten some new furniture.  They are figuring out how much they want assisted nursing person to come into the house.  Probably have him come in 4 hours a few days a week.  He will try to figure out when to come.  Overall though she is eating well.  Her sleep is not as good.  Since we reduced her Restoril down to 15 mg she is not sleeping that well.  The patient today acknowledges that she needs somebody to talk to about everything.  She is agreed for grieving therapy.  I connected with Kathlynn Grate Thone on 04/12/22 at  3:30 PM EST by telephone and verified that I am speaking with the correct person using two identifiers.  Location: Patient: home Provider: office   I discussed the limitations, risks, security and privacy concerns of performing an evaluation and management service by telephone and the availability of in person appointments. I also discussed with the patient that there may be a patient responsible charge related to this service. The patient expressed understanding and agreed to proceed.      I discussed the assessment and treatment plan with the patient. The patient was provided an opportunity to ask questions and all were answered. The patient agreed with the plan and demonstrated an understanding of the instructions.   The patient was advised to call back or seek an in-person evaluation if the  symptoms worsen or if the condition fails to improve as anticipated.  I provided 30 minutes of non-face-to-face time during this encounter.   Jerral Ralph, MD  (Hypo) Manic Symptoms:   Anxiety Symptoms:   Psychotic Symptoms:   PTSD Symptoms:   Past Psychiatric History: none  Previous Psychotropic Medications: Yes   Substance Abuse History in the last 12 months:  No.  Consequences of Substance Abuse:   Past Medical History:  Past Medical History:  Diagnosis Date   Arthritis    OA- knees & back  neck   Chronic back pain    Cognitive retention disorder    Collapse of right lung    COPD (chronic obstructive pulmonary disease) (HCC)    Hx. Bronchitis occ, 01-25-11 somes issue now taking  Z-pak-started 01-24-11/Scar tissue  present  from previous lung collapse   Dementia with psychosis ([place])    Depression    Depression    Diabetes mellitus ([place]) 07/08/2020   Dyspnea    Dysrhythmia 2018   treated with Ablation    GERD (gastroesophageal reflux disease)    tx. TUMS   Glaucoma    Headache    History of kidney stones    surgically treated   Hyperlipemia    Hypertension    Hypothyroidism    tx. Levothyroxine   Major depression with psychotic features [facility])    Nephrolithiasis    Neuromuscular disorder ([place]) 01-25-11  Pain/nerve stimulator implanted -2 yrs ago.   Reflex, gag, absent    Reflex, gag, absent     Past Surgical History:  Procedure Laterality Date   A-FLUTTER ABLATION N/A 08/16/2016   Procedure: A-Flutter Ablation;  Surgeon: Evans Lance, MD;  Location: [facility];  Service: Cardiovascular;  Laterality: N/A;   ANTERIOR CERVICAL DECOMP/DISCECTOMY FUSION     3 levels, per family   APPENDECTOMY  01-25-11   CARPAL TUNNEL RELEASE  01-25-11   Bil.   CERVICAL SPINE SURGERY  01-25-11   2005-multiple levels   CHOLECYSTECTOMY  01-25-11   '07   COLONOSCOPY WITH PROPOFOL N/A 07/23/2012   Procedure: COLONOSCOPY WITH PROPOFOL;  Surgeon: Garlan Fair, MD;   Location: [facility];  Service: Endoscopy;  Laterality: N/A;   detatched retna Bilateral    done July & August- 2019   EYE SURGERY Bilateral 2019   repair of retina detachment- Dr. Zadie Rhine at surgical center    JOINT REPLACEMENT  01-25-11   6'03 [place]  01-25-11   '04-right, revised x2   no gag reflex     Pt does not havea gag reflex following  siurgery to neck. Family unsure of date. Pt takes pill in apple sauce.   SPINAL CORD STIMULATOR BATTERY EXCHANGE N/A 01/12/2018   Procedure: Spinal cord stimulator battery replacement;  Surgeon: Clydell Hakim, MD;  Location: [facility];  Service: Neurosurgery;  Laterality: N/A;  left   SPINAL CORD STIMULATOR IMPLANT  2009 APPROX   DR. ELSNER   SPINAL CORD STIMULATOR REMOVAL N/A 06/04/2018   Procedure: Removal of spinal cord stimulator, thoracic paddle and generator;  Surgeon: Kristeen Miss, MD;  Location: [facility];  Service: Neurosurgery;  Laterality: N/A;   THORACIC SPINE SURGERY  01-25-11   6'02- then nerve stimulator implanted after   TOTAL HIP ARTHROPLASTY Right 07/29/2020   Procedure: RIGHT TOTAL HIP ARTHROPLASTY ANTERIOR APPROACH;  Surgeon: Marybelle Killings, MD;  Location: [facility];  Service: Orthopedics;  Laterality: Right;   TOTAL KNEE REVISION  02/01/2011   Procedure: TOTAL KNEE REVISION;  Surgeon: Mauri Pole;  Location: [facility];  Service: Orthopedics;  Laterality: Right;   TUBAL LIGATION      Family Psychiatric History:   Family History:  Family History  Problem Relation Age of Onset   Heart attack Father    Emphysema Father    Aneurysm Mother        CEREBRAL   Emphysema Mother    Dementia Mother    Diabetes Son    Hypertension Son    Hypertension Son    Cancer Sister        Widely Metastatic   Asthma Son        x2   Breast cancer Paternal Aunt    Rheum arthritis Maternal Aunt     Social History:   Social History   Socioeconomic History   Marital status: Widowed    Spouse name: Not on file   Number of  children: 2   Years of education: Not on file   Highest education level: Not on file  Occupational History   Occupation: disabled  Tobacco Use   Smoking status: Former    Packs/day: 0.75    Years: 50.00    Total pack years: 37.50    Types: Cigarettes    Start date: 09/13/1961    Quit date: 05/2016    Years since quitting: 5.9   Smokeless tobacco: Never   Tobacco comments:  Decreased Down to 2/3 a Day if Thinking [place] It  Vaping Use   Vaping Use: Never used  Substance and Sexual Activity   Alcohol use: No    Alcohol/week: 0.0 standard drinks of alcohol    Comment: none in 10 years   Drug use: No   Sexual activity: Not Currently  Other Topics Concern   Not on file  Social History Narrative   She is from [facility]. She has always lived in [place]. She has traveled to [place], [place], [place], [place], & [place]. 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 Prior exposure to parakeets with last exposure remote in her current home. Previously had mold in her home that was in her bathroom & fixed. Prior exposure to hot tubs but none recently. Pt lives at home with [name], husband.  Has 2 childrean, 12th grade education.     Social Determinants of Health   Financial Resource Strain: Low Risk  (03/15/2022)   Overall Financial Resource Strain (CARDIA)    Difficulty of Paying Living Expenses: Not hard at all  Food Insecurity: No Food Insecurity (03/15/2022)   Hunger Vital Sign    Worried About Running Out of Food in the Last Year: Never true    Ran Out of Food in the Last Year: Never true  Transportation Needs: No Transportation Needs (03/15/2022)   PRAPARE - Hydrologist (Medical): No    Lack of Transportation (Non-Medical): No  Physical Activity: Inactive (03/15/2022)   Exercise Vital Sign    Days of Exercise per Week: 0 days    Minutes of Exercise per Session: 0  min  Stress: No Stress Concern Present (03/15/2022)   [place]    Feeling of Stress : Not at all  Social Connections: Moderately Isolated (03/15/2022)   Social Connection and Isolation Panel [NHANES]    Frequency of Communication with Friends and Family: More than three times a week    Frequency of Social Gatherings with Friends and Family: More than three times a week    Attends Religious Services: Never    Marine scientist or Organizations: Yes    Attends Archivist Meetings: Never    Marital Status: Widowed    Additional Social History:   Allergies:   Allergies  Allergen Reactions   Albuterol Shortness Of Breath and Other (See Comments)   Penicillins Anaphylaxis, Swelling and Other (See Comments)    Has patient had a PCN reaction causing immediate rash, facial/tongue/throat swelling, SOB or lightheadedness with hypotension: Yes Has patient had a PCN reaction causing severe rash involving mucus membranes or skin necrosis: Rash Has patient had a PCN reaction that required hospitalization: No Has patient had a PCN reaction occurring within the last 10 years: No If all of the above answers are "NO", then may proceed with Cephalosporin use.   Sulfa Antibiotics Swelling and Other (See Comments)    Throat swells, but no shortness of breath noted   Codeine Nausea And Vomiting   Ecotrin [Aspirin] Nausea And Vomiting   Zanaflex [Tizanidine] Other (See Comments)    Possible confusion    Metabolic Disorder Labs: Lab Results  Component Value Date   HGBA1C 7.5 (A) 07/08/2020   MPG 217 03/02/2020   MPG 137 (H) 04/28/2015   No results found for: "PROLACTIN" Lab Results  Component Value Date   CHOL 338 (H) 03/02/2020   TRIG 397 (H) 03/02/2020   HDL 52 03/02/2020   CHOLHDL 6.5 (H) 03/02/2020   VLDL 52.8 (H) 02/21/2019   LDLCALC 219 (H) 03/02/2020   LDLCALC 114 04/28/2015     Current  Medications: Current Outpatient Medications  Medication Sig Dispense Refill   acetaminophen (TYLENOL) 500 MG tablet Take 1,000 mg by mouth every 6 (six) hours as needed for moderate pain.     albuterol (VENTOLIN HFA) 108 (90 Base) MCG/ACT inhaler Inhale 2 puffs into the lungs every 6 (six) hours as needed for wheezing or shortness of breath. 8.5 g 6   amLODipine (NORVASC) 5 MG tablet Take 1 tablet (5 mg total) by mouth daily. SCHEDULE APPT FOR FUTURE REFILLS 90 tablet 0   Budeson-Glycopyrrol-Formoterol (BREZTRI AEROSPHERE) 160-9-4.8 MCG/ACT AERO Inhale 2 puffs into the lungs in the morning and at bedtime. 10.7 g 5   buPROPion (WELLBUTRIN XL) 150 MG 24 hr tablet Take 1 tablet (150 mg total) by mouth every morning. 3  qam 90 tablet 6   cyclobenzaprine (FLEXERIL) 5 MG tablet TAKE ONE-HALF TABLET DAILY AS NEEDED FOR MUSCLE SPASM 45 tablet 0   diphenhydrAMINE (BENADRYL) 25 MG tablet Take 25 mg by mouth in the morning.     donepezil (ARICEPT) 23 MG TABS tablet 1  qhs 30 tablet 5   fluticasone (FLONASE) 50 MCG/ACT nasal spray TWO SPRAYS IN EACH NOSTRIL ONCE DAILY (Patient not taking: Reported on 03/10/2022) 16 g 2   gabapentin (NEURONTIN) 100 MG capsule TAKE ONE CAPSULE TWICE A DAY 180 capsule 0   ipratropium (ATROVENT) 0.03 % nasal spray PLACE 2 SPRAYS INTO BOTH NOSTRILS EVERY 12 HOURS 30 mL 2   ipratropium-albuterol (DUONEB) 0.5-2.5 (3) MG/3ML SOLN Take 3 mLs by nebulization every 6 (six) hours as needed. 360 mL 0   levothyroxine (SYNTHROID) 125 MCG tablet Take 1 tablet (125 mcg total) by mouth daily. 90 tablet 1   lisinopril (ZESTRIL) 5 MG tablet TAKE ONE TABLET BY MOUTH ONCE DAILY 90 tablet 1   melatonin 5 MG TABS Take 5 mg by mouth at bedtime.     meloxicam (MOBIC) 7.5 MG tablet Take 1 tablet (7.5 mg total) by mouth daily. 20 tablet 0   metFORMIN (GLUCOPHAGE) 500 MG tablet TAKE ONE TABLET TWICE DAILY with a meal 180 tablet 0   pantoprazole (PROTONIX) 40 MG tablet TAKE ONE TABLET EVERY DAY 90 tablet  0   predniSONE (DELTASONE) 10 MG tablet Take 2 tablets (20 mg total) by mouth daily with breakfast. (Patient not taking: Reported on 03/10/2022) 10 tablet 0   propranolol (INDERAL) 80 MG tablet Take 1 tablet (80 mg total) by mouth 3 (three) times daily. SCHEDULE APPT FOR FUTURE REFILLS 135 tablet 0   rosuvastatin (CRESTOR) 5 MG tablet TAKE ONE TABLET EVERY DAY 90 tablet 0   sodium chloride 1 g tablet TAKE ONE TABLET THREE TIMES DAILY 180 tablet 1   temazepam (RESTORIL) 30 MG capsule 2 qhs 60 capsule 5   No current facility-administered medications for this visit.    Neurologic: Headache: No Seizure: No Paresthesias:No  Musculoskeletal: Strength & Muscle Tone: decreased Gait & Station: unsteady Patient leans: N/A  Psychiatric Specialty Exam: ROS  There were no vitals taken for this visit.There is no height or weight on file to calculate BMI.  General Appearance: Casual  Eye Contact:  Good  Speech:  Clear and Coherent  Volume:  Normal  Mood:  NA  Affect:  depressed  Thought Process:  Goal Directed  Orientation:  Full (Time, Place, and Person)  Thought Content:  WDL  Suicidal Thoughts:  No  Homicidal Thoughts:  No  Memory:  Immediate;   Poor  Judgement:  Fair  Insight:  Lacking  Psychomotor Activity:  Normal  Concentration:    Recall:  Poor  Fund of Knowledge:Poor  Language: Fair  Akathisia:  No  Handed:  Right  AIMS (if indicated):   Assets:  Communication Skills  ADL's:  Impaired  Cognition: Impaired,  Moderate  Sleep:      Treatment Plan Summary:   This patient's diagnosis is major depression.  I think it is early to make any big changes with antidepressants as her husband has only been deceased for a few months.  It is true that she is getting somewhat worse but she is also not sleeping.  Today we will continue her Wellbutrin 450 mg and will increase her Restoril from 15 mg up to 30 mg.  Her most important intervention however is to give her the telephone number  to hospice.  Patient is agreed to call hospice and try to get a therapist.  If she cannot get an individual therapist through them will attempt to give her therapist here.  We are going to consider adding Remeron but at this time we will not.  She will return to see [place] in about 7 to 8 weeks and we will reevaluate her at that time.  She was seen with [name] today.  I think she is stable.

## 2022-04-13 ENCOUNTER — Other Ambulatory Visit (HOSPITAL_COMMUNITY): Payer: Self-pay | Admitting: *Deleted

## 2022-04-13 MED ORDER — TEMAZEPAM 30 MG PO CAPS: ORAL_CAPSULE | ORAL | 5 refills | Status: DC

## 2022-04-27 ENCOUNTER — Other Ambulatory Visit: Payer: Self-pay | Admitting: Family Medicine

## 2022-05-02 ENCOUNTER — Inpatient Hospital Stay: Admission: RE | Admit: 2022-05-02 | Payer: Medicare Other | Source: Ambulatory Visit

## 2022-05-02 ENCOUNTER — Other Ambulatory Visit: Payer: Medicare Other

## 2022-05-02 ENCOUNTER — Other Ambulatory Visit: Payer: Self-pay | Admitting: Family Medicine

## 2022-05-02 DIAGNOSIS — E1169 Type 2 diabetes mellitus with other specified complication: Secondary | ICD-10-CM

## 2022-05-02 DIAGNOSIS — G8929 Other chronic pain: Secondary | ICD-10-CM

## 2022-05-05 ENCOUNTER — Ambulatory Visit (INDEPENDENT_AMBULATORY_CARE_PROVIDER_SITE_OTHER): Payer: Medicare Other | Admitting: Family Medicine

## 2022-05-05 ENCOUNTER — Other Ambulatory Visit: Payer: Self-pay

## 2022-05-05 VITALS — BP 150/62 | HR 64 | Temp 97.9°F | Ht 66.0 in | Wt 186.0 lb

## 2022-05-05 DIAGNOSIS — M25551 Pain in right hip: Secondary | ICD-10-CM

## 2022-05-05 DIAGNOSIS — I700 Atherosclerosis of aorta: Secondary | ICD-10-CM

## 2022-05-05 DIAGNOSIS — F4321 Adjustment disorder with depressed mood: Secondary | ICD-10-CM | POA: Diagnosis not present

## 2022-05-05 DIAGNOSIS — M25561 Pain in right knee: Secondary | ICD-10-CM | POA: Diagnosis not present

## 2022-05-05 DIAGNOSIS — G8929 Other chronic pain: Secondary | ICD-10-CM

## 2022-05-05 DIAGNOSIS — E039 Hypothyroidism, unspecified: Secondary | ICD-10-CM | POA: Diagnosis not present

## 2022-05-05 DIAGNOSIS — K219 Gastro-esophageal reflux disease without esophagitis: Secondary | ICD-10-CM | POA: Diagnosis not present

## 2022-05-05 DIAGNOSIS — I10 Essential (primary) hypertension: Secondary | ICD-10-CM

## 2022-05-05 DIAGNOSIS — E1169 Type 2 diabetes mellitus with other specified complication: Secondary | ICD-10-CM | POA: Diagnosis not present

## 2022-05-05 MED ORDER — LISINOPRIL 5 MG PO TABS: 5.0000 mg | ORAL_TABLET | Freq: Every day | ORAL | 3 refills | Status: DC

## 2022-05-05 MED ORDER — AMLODIPINE BESYLATE 5 MG PO TABS: 5.0000 mg | ORAL_TABLET | Freq: Every day | ORAL | 3 refills | Status: DC

## 2022-05-05 MED ORDER — CYCLOBENZAPRINE HCL 5 MG PO TABS: 5.0000 mg | ORAL_TABLET | Freq: Every day | ORAL | 0 refills | Status: DC | PRN

## 2022-05-05 MED ORDER — AMLODIPINE BESYLATE 5 MG PO TABS: 5.0000 mg | ORAL_TABLET | Freq: Every day | ORAL | 0 refills | Status: DC

## 2022-05-05 MED ORDER — METFORMIN HCL 500 MG PO TABS: ORAL_TABLET | ORAL | 0 refills | Status: DC

## 2022-05-05 MED ORDER — LISINOPRIL 5 MG PO TABS: 5.0000 mg | ORAL_TABLET | Freq: Every day | ORAL | 1 refills | Status: DC

## 2022-05-05 MED ORDER — METFORMIN HCL 500 MG PO TABS: ORAL_TABLET | ORAL | 3 refills | Status: DC

## 2022-05-05 MED ORDER — PANTOPRAZOLE SODIUM 40 MG PO TBEC: 40.0000 mg | DELAYED_RELEASE_TABLET | Freq: Every day | ORAL | 0 refills | Status: DC

## 2022-05-05 NOTE — Progress Notes (Signed)
Established Patient Office Visit   Subjective  Patient ID: Destiny Hughes, female    DOB: 1945-09-30  Age: 77 y.o. MRN: [id number]  Chief Complaint  Patient presents with  . Medication Refill  Patient accompanied by her son.  Patient is a 77 year old female with pmh sig for HTN, atherosclerosis of aorta, DM 2, seasonal allergies, OA seen for follow-up on chronic conditions.  Patient with continued rhinorrhea.  Unsure if still taking allergy medication.  Patient endorses continued soreness in right lateral hip down to knee.  Causing 8/10 pain.  Nothing really helps with pain.   {History (Optional):23778}  ROS Negative unless stated above    Objective:     BP (!) 150/62 (BP Location: Left Arm, Patient Position: Sitting, Cuff Size: Large)   Pulse 64   Temp 97.9 F (36.6 C) (Oral)   Ht 5\' 6"  (1.676 m)   Wt 186 lb (84.4 kg)   SpO2 96%   BMI 30.02 kg/m  {Vitals History (Optional):23777}  Physical Exam Constitutional:      General: She is not in acute distress.    Appearance: Normal appearance.  HENT:     Head: Normocephalic and atraumatic.     Right Ear: Tympanic membrane normal.     Left Ear: Tympanic membrane normal.     Nose: Rhinorrhea present.     Mouth/Throat:     Mouth: Mucous membranes are moist.  Eyes:     Extraocular Movements: Extraocular movements intact.     Conjunctiva/sclera: Conjunctivae normal.     Pupils: Pupils are equal, round, and reactive to light.  Cardiovascular:     Rate and Rhythm: Normal rate and regular rhythm.     Heart sounds: Normal heart sounds. No murmur heard.    No gallop.  Pulmonary:     Effort: Pulmonary effort is normal. No respiratory distress.     Breath sounds: Normal breath sounds. No wheezing, rhonchi or rales.  Abdominal:     General: Bowel sounds are normal.     Palpations: Abdomen is soft.  Musculoskeletal:     Comments: TTP bilateral LE weakness.  Nonpitting edema.  Skin:    General: Skin is warm and dry.   Neurological:     Mental Status: She is alert and oriented to person, place, and time. Mental status is at baseline.     No results found for any visits on 05/05/22.    Assessment & Plan:  Essential hypertension -     Comprehensive metabolic panel -     Lipid panel -     TSH -     amLODIPine Besylate; Take 1 tablet (5 mg total) by mouth daily.  Dispense: 90 tablet; Refill: 3 -     Lisinopril; Take 1 tablet (5 mg total) by mouth daily.  Dispense: 90 tablet; Refill: 3  Chronic pain of right knee -     CBC with Differential/Platelet -     Cyclobenzaprine HCl; Take 1 tablet (5 mg total) by mouth daily as needed for muscle spasms.  Dispense: 45 tablet; Refill: 0  Type 2 diabetes mellitus with other specified complication, without long-term current use of insulin (HCC) -     Lipid panel -     Hemoglobin A1c -     Vitamin B12 -     metFORMIN HCl; TAKE ONE TABLET TWICE DAILY with a meal  Dispense: 180 tablet; Refill: 3  Gastroesophageal reflux disease, unspecified whether esophagitis present -     Pantoprazole Sodium;  Take 1 tablet (40 mg total) by mouth daily.  Dispense: 90 tablet; Refill: 0  Acquired hypothyroidism -     TSH  Aortic atherosclerosis (HCC) -     Lipid panel  Grief  Right hip pain -     Cyclobenzaprine HCl; Take 1 tablet (5 mg total) by mouth daily as needed for muscle spasms.  Dispense: 45 tablet; Refill: 0  Other orders -     LDL cholesterol, direct    No follow-ups on file.   Billie Ruddy, MD

## 2022-05-05 NOTE — Patient Instructions (Signed)
A prescription for flexeril, the muscle relaxer was sent to you pharmacy.

## 2022-05-06 LAB — CBC WITH DIFFERENTIAL/PLATELET
Basophils Absolute: 0.1 10*3/uL (ref 0.0–0.1)
Basophils Relative: 0.7 % (ref 0.0–3.0)
Eosinophils Absolute: 0.1 10*3/uL (ref 0.0–0.7)
Eosinophils Relative: 1.9 % (ref 0.0–5.0)
HCT: 40.7 % (ref 36.0–46.0)
Hemoglobin: 13.3 g/dL (ref 12.0–15.0)
Lymphocytes Relative: 19.7 % (ref 12.0–46.0)
Lymphs Abs: 1.5 10*3/uL (ref 0.7–4.0)
MCHC: 32.7 g/dL (ref 30.0–36.0)
MCV: 82.6 fl (ref 78.0–100.0)
Monocytes Absolute: 0.7 10*3/uL (ref 0.1–1.0)
Monocytes Relative: 8.8 % (ref 3.0–12.0)
Neutro Abs: 5.3 10*3/uL (ref 1.4–7.7)
Neutrophils Relative %: 68.9 % (ref 43.0–77.0)
Platelets: 409 10*3/uL — ABNORMAL HIGH (ref 150.0–400.0)
RBC: 4.92 Mil/uL (ref 3.87–5.11)
RDW: 15.9 % — ABNORMAL HIGH (ref 11.5–15.5)
WBC: 7.7 10*3/uL (ref 4.0–10.5)

## 2022-05-06 LAB — LDL CHOLESTEROL, DIRECT: Direct LDL: 77 mg/dL

## 2022-05-06 LAB — COMPREHENSIVE METABOLIC PANEL
ALT: 11 U/L (ref 0–35)
AST: 11 U/L (ref 0–37)
Albumin: 4.2 g/dL (ref 3.5–5.2)
Alkaline Phosphatase: 68 U/L (ref 39–117)
BUN: 16 mg/dL (ref 6–23)
CO2: 32 mEq/L (ref 19–32)
Calcium: 9.7 mg/dL (ref 8.4–10.5)
Chloride: 95 mEq/L — ABNORMAL LOW (ref 96–112)
Creatinine, Ser: 1.1 mg/dL (ref 0.40–1.20)
GFR: 48.08 mL/min — ABNORMAL LOW (ref >60.00)
Glucose, Bld: 128 mg/dL — ABNORMAL HIGH (ref 70–99)
Potassium: 5.3 mEq/L — ABNORMAL HIGH (ref 3.5–5.1)
Sodium: 133 mEq/L — ABNORMAL LOW (ref 135–145)
Total Bilirubin: 0.3 mg/dL (ref 0.2–1.2)
Total Protein: 7 g/dL (ref 6.0–8.3)

## 2022-05-06 LAB — LIPID PANEL
Cholesterol: 186 mg/dL (ref 0–200)
HDL: 55 mg/dL (ref >39.00)
Total CHOL/HDL Ratio: 3
Triglycerides: 453 mg/dL — ABNORMAL HIGH (ref 0.0–149.0)

## 2022-05-06 LAB — HEMOGLOBIN A1C: Hgb A1c MFr Bld: 7.8 % — ABNORMAL HIGH (ref 4.6–6.5)

## 2022-05-06 LAB — TSH: TSH: 3.92 u[IU]/mL (ref 0.35–5.50)

## 2022-05-06 LAB — VITAMIN B12: Vitamin B-12: 161 pg/mL — ABNORMAL LOW (ref 211–911)

## 2022-05-09 ENCOUNTER — Other Ambulatory Visit: Payer: Self-pay | Admitting: Family Medicine

## 2022-05-09 ENCOUNTER — Other Ambulatory Visit: Payer: Self-pay

## 2022-05-09 DIAGNOSIS — E782 Mixed hyperlipidemia: Secondary | ICD-10-CM

## 2022-05-09 DIAGNOSIS — I10 Essential (primary) hypertension: Secondary | ICD-10-CM

## 2022-05-09 MED ORDER — ROSUVASTATIN CALCIUM 10 MG PO TABS: 10.0000 mg | ORAL_TABLET | Freq: Every day | ORAL | 3 refills | Status: DC

## 2022-05-17 ENCOUNTER — Other Ambulatory Visit: Payer: Medicare Other

## 2022-05-20 ENCOUNTER — Other Ambulatory Visit: Payer: Self-pay | Admitting: Family Medicine

## 2022-06-12 ENCOUNTER — Encounter: Payer: Self-pay | Admitting: Family Medicine

## 2022-06-14 ENCOUNTER — Ambulatory Visit (HOSPITAL_COMMUNITY): Payer: Medicare Other | Admitting: Psychiatry

## 2022-06-20 ENCOUNTER — Other Ambulatory Visit: Payer: Self-pay | Admitting: Family Medicine

## 2022-06-20 DIAGNOSIS — G8929 Other chronic pain: Secondary | ICD-10-CM

## 2022-06-20 DIAGNOSIS — M25551 Pain in right hip: Secondary | ICD-10-CM

## 2022-07-14 DIAGNOSIS — R609 Edema, unspecified: Secondary | ICD-10-CM | POA: Diagnosis not present

## 2022-07-14 DIAGNOSIS — R069 Unspecified abnormalities of breathing: Secondary | ICD-10-CM | POA: Diagnosis not present

## 2022-07-20 DEATH — deceased

## 2022-07-22 ENCOUNTER — Telehealth: Payer: Self-pay | Admitting: Family Medicine

## 2022-07-22 NOTE — Telephone Encounter (Signed)
DAVE system has certificate for signing

## 2022-07-25 ENCOUNTER — Encounter: Payer: Self-pay | Admitting: Family Medicine

## 2022-07-25 NOTE — Telephone Encounter (Signed)
Destiny Hughes with [facility] is calling checking on death certificate in [facility] system

## 2022-07-25 NOTE — Telephone Encounter (Signed)
Error/njr °

## 2022-07-27 NOTE — Telephone Encounter (Signed)
This provider was not aware of pt's passing until today.  Attempted to access [facility], however unable to log on.  Possibly weather related.  Will try to access system again shortly.

## 2022-07-27 NOTE — Telephone Encounter (Signed)
Able to access [facility], however funeral home failed to provide time of death.  Will need this info in order to complete death certificate.

## 2022-07-27 NOTE — Telephone Encounter (Signed)
Destiny Hughes with [facility] called to say the family has been waiting over a week for MD to sign the death certificate.  Destiny Hughes also stated that if MD does not sign the DC soon, she will have no choice but to report MD to the [facility] Board, as per family's wishes.   Pt passed on Jul 23, 2022.

## 2022-08-16 ENCOUNTER — Ambulatory Visit (HOSPITAL_COMMUNITY): Payer: Medicare Other | Admitting: Psychiatry

## 2023-03-20 ENCOUNTER — Telehealth: Payer: Self-pay

## 2023-03-20 NOTE — Telephone Encounter (Signed)
 Contacted patient on preferred number listed in notes for scheduled AWV. Patient declined visit.

## 2023-05-29 ENCOUNTER — Encounter: Payer: Self-pay | Admitting: Emergency Medicine
